# Patient Record
Sex: Male | Born: 1960
Health system: Southern US, Community
[De-identification: ages and names within clinical notes are randomized; demographics above are authoritative.]

## PROBLEM LIST (undated history)

## (undated) DIAGNOSIS — I10 Essential (primary) hypertension: Secondary | ICD-10-CM

## (undated) DIAGNOSIS — R011 Cardiac murmur, unspecified: Secondary | ICD-10-CM

## (undated) DIAGNOSIS — F329 Major depressive disorder, single episode, unspecified: Secondary | ICD-10-CM

## (undated) DIAGNOSIS — I509 Heart failure, unspecified: Secondary | ICD-10-CM

## (undated) DIAGNOSIS — J449 Chronic obstructive pulmonary disease, unspecified: Secondary | ICD-10-CM

## (undated) DIAGNOSIS — G4730 Sleep apnea, unspecified: Secondary | ICD-10-CM

## (undated) DIAGNOSIS — E785 Hyperlipidemia, unspecified: Secondary | ICD-10-CM

## (undated) DIAGNOSIS — J439 Emphysema, unspecified: Secondary | ICD-10-CM

## (undated) DIAGNOSIS — E119 Type 2 diabetes mellitus without complications: Secondary | ICD-10-CM

## (undated) DIAGNOSIS — J45909 Unspecified asthma, uncomplicated: Secondary | ICD-10-CM

## (undated) DIAGNOSIS — F419 Anxiety disorder, unspecified: Secondary | ICD-10-CM

## (undated) DIAGNOSIS — I4891 Unspecified atrial fibrillation: Secondary | ICD-10-CM

## (undated) DIAGNOSIS — M1990 Unspecified osteoarthritis, unspecified site: Secondary | ICD-10-CM

## (undated) DIAGNOSIS — F32A Depression, unspecified: Secondary | ICD-10-CM

## (undated) HISTORY — DX: Major depressive disorder, single episode, unspecified: F32.9

## (undated) HISTORY — DX: Sleep apnea, unspecified: G47.30

## (undated) HISTORY — PX: NO PAST SURGERIES: SHX2092

## (undated) HISTORY — DX: Emphysema, unspecified: J43.9

## (undated) HISTORY — DX: Unspecified osteoarthritis, unspecified site: M19.90

## (undated) HISTORY — DX: Chronic obstructive pulmonary disease, unspecified: J44.9

## (undated) HISTORY — DX: Anxiety disorder, unspecified: F41.9

## (undated) HISTORY — DX: Hyperlipidemia, unspecified: E78.5

## (undated) HISTORY — DX: Unspecified asthma, uncomplicated: J45.909

## (undated) HISTORY — DX: Cardiac murmur, unspecified: R01.1

## (undated) HISTORY — DX: Essential (primary) hypertension: I10

## (undated) HISTORY — DX: Type 2 diabetes mellitus without complications: E11.9

## (undated) HISTORY — DX: Depression, unspecified: F32.A

---

## 1999-01-21 ENCOUNTER — Emergency Department (HOSPITAL_COMMUNITY): Admission: EM | Admit: 1999-01-21 | Discharge: 1999-01-21 | Payer: Self-pay

## 1999-10-23 ENCOUNTER — Inpatient Hospital Stay (HOSPITAL_COMMUNITY): Admission: EM | Admit: 1999-10-23 | Discharge: 1999-10-27 | Payer: Self-pay | Admitting: *Deleted

## 1999-10-28 ENCOUNTER — Other Ambulatory Visit (HOSPITAL_COMMUNITY): Admission: RE | Admit: 1999-10-28 | Discharge: 1999-11-22 | Payer: Self-pay | Admitting: Psychiatry

## 2000-11-05 ENCOUNTER — Emergency Department (HOSPITAL_COMMUNITY): Admission: EM | Admit: 2000-11-05 | Discharge: 2000-11-05 | Payer: Self-pay | Admitting: *Deleted

## 2004-01-10 ENCOUNTER — Emergency Department (HOSPITAL_COMMUNITY): Admission: EM | Admit: 2004-01-10 | Discharge: 2004-01-11 | Payer: Self-pay | Admitting: Emergency Medicine

## 2016-06-05 DIAGNOSIS — E1122 Type 2 diabetes mellitus with diabetic chronic kidney disease: Secondary | ICD-10-CM | POA: Insufficient documentation

## 2016-06-05 DIAGNOSIS — E1140 Type 2 diabetes mellitus with diabetic neuropathy, unspecified: Secondary | ICD-10-CM | POA: Insufficient documentation

## 2016-06-05 DIAGNOSIS — J441 Chronic obstructive pulmonary disease with (acute) exacerbation: Secondary | ICD-10-CM | POA: Insufficient documentation

## 2016-06-05 DIAGNOSIS — E119 Type 2 diabetes mellitus without complications: Secondary | ICD-10-CM | POA: Insufficient documentation

## 2016-06-05 DIAGNOSIS — F418 Other specified anxiety disorders: Secondary | ICD-10-CM | POA: Insufficient documentation

## 2016-06-06 DIAGNOSIS — I129 Hypertensive chronic kidney disease with stage 1 through stage 4 chronic kidney disease, or unspecified chronic kidney disease: Secondary | ICD-10-CM | POA: Insufficient documentation

## 2016-06-06 DIAGNOSIS — D649 Anemia, unspecified: Secondary | ICD-10-CM | POA: Insufficient documentation

## 2016-06-07 DIAGNOSIS — K5901 Slow transit constipation: Secondary | ICD-10-CM | POA: Insufficient documentation

## 2017-11-28 DIAGNOSIS — I480 Paroxysmal atrial fibrillation: Secondary | ICD-10-CM | POA: Diagnosis present

## 2017-11-28 DIAGNOSIS — I5023 Acute on chronic systolic (congestive) heart failure: Secondary | ICD-10-CM | POA: Insufficient documentation

## 2017-12-14 ENCOUNTER — Encounter: Payer: Self-pay | Admitting: Family Medicine

## 2018-07-06 ENCOUNTER — Ambulatory Visit (INDEPENDENT_AMBULATORY_CARE_PROVIDER_SITE_OTHER): Payer: Medicare Other | Admitting: Family Medicine

## 2018-07-06 ENCOUNTER — Encounter: Payer: Self-pay | Admitting: Family Medicine

## 2018-07-06 VITALS — BP 130/70 | HR 57 | Temp 97.6°F | Ht 72.0 in | Wt 164.4 lb

## 2018-07-06 DIAGNOSIS — J449 Chronic obstructive pulmonary disease, unspecified: Secondary | ICD-10-CM | POA: Diagnosis not present

## 2018-07-06 DIAGNOSIS — F418 Other specified anxiety disorders: Secondary | ICD-10-CM | POA: Diagnosis not present

## 2018-07-06 DIAGNOSIS — F329 Major depressive disorder, single episode, unspecified: Secondary | ICD-10-CM | POA: Insufficient documentation

## 2018-07-06 DIAGNOSIS — Z0000 Encounter for general adult medical examination without abnormal findings: Secondary | ICD-10-CM | POA: Insufficient documentation

## 2018-07-06 DIAGNOSIS — I152 Hypertension secondary to endocrine disorders: Secondary | ICD-10-CM | POA: Insufficient documentation

## 2018-07-06 DIAGNOSIS — E119 Type 2 diabetes mellitus without complications: Secondary | ICD-10-CM | POA: Diagnosis not present

## 2018-07-06 DIAGNOSIS — Z0001 Encounter for general adult medical examination with abnormal findings: Secondary | ICD-10-CM

## 2018-07-06 DIAGNOSIS — D539 Nutritional anemia, unspecified: Secondary | ICD-10-CM | POA: Insufficient documentation

## 2018-07-06 DIAGNOSIS — D649 Anemia, unspecified: Secondary | ICD-10-CM | POA: Diagnosis not present

## 2018-07-06 DIAGNOSIS — I10 Essential (primary) hypertension: Secondary | ICD-10-CM | POA: Diagnosis not present

## 2018-07-06 DIAGNOSIS — F1010 Alcohol abuse, uncomplicated: Secondary | ICD-10-CM | POA: Insufficient documentation

## 2018-07-06 DIAGNOSIS — Z7189 Other specified counseling: Secondary | ICD-10-CM | POA: Insufficient documentation

## 2018-07-06 DIAGNOSIS — E1159 Type 2 diabetes mellitus with other circulatory complications: Secondary | ICD-10-CM | POA: Insufficient documentation

## 2018-07-06 LAB — URINALYSIS, ROUTINE W REFLEX MICROSCOPIC
Bilirubin Urine: NEGATIVE
Hgb urine dipstick: NEGATIVE
KETONES UR: NEGATIVE
Leukocytes, UA: NEGATIVE
Nitrite: NEGATIVE
PH: 7 (ref 5.0–8.0)
RBC / HPF: NONE SEEN (ref 0–?)
SPECIFIC GRAVITY, URINE: 1.01 (ref 1.000–1.030)
Total Protein, Urine: NEGATIVE
UROBILINOGEN UA: 0.2 (ref 0.0–1.0)
Urine Glucose: NEGATIVE

## 2018-07-06 LAB — COMPREHENSIVE METABOLIC PANEL
ALT: 16 U/L (ref 0–53)
AST: 16 U/L (ref 0–37)
Albumin: 4.2 g/dL (ref 3.5–5.2)
Alkaline Phosphatase: 67 U/L (ref 39–117)
BUN: 11 mg/dL (ref 6–23)
CALCIUM: 9.5 mg/dL (ref 8.4–10.5)
CO2: 36 meq/L — AB (ref 19–32)
Chloride: 100 mEq/L (ref 96–112)
Creatinine, Ser: 1.26 mg/dL (ref 0.40–1.50)
GFR: 75.87 mL/min (ref >60.00)
GLUCOSE: 91 mg/dL (ref 70–99)
Potassium: 4.6 mEq/L (ref 3.5–5.1)
Sodium: 140 mEq/L (ref 135–145)
Total Bilirubin: 0.3 mg/dL (ref 0.2–1.2)
Total Protein: 7.1 g/dL (ref 6.0–8.3)

## 2018-07-06 LAB — PSA: PSA: 2.52 ng/mL (ref 0.10–4.00)

## 2018-07-06 LAB — CBC
HCT: 41.4 % (ref 39.0–52.0)
HEMOGLOBIN: 14.1 g/dL (ref 13.0–17.0)
MCHC: 34.1 g/dL (ref 30.0–36.0)
MCV: 89.6 fl (ref 78.0–100.0)
PLATELETS: 234 10*3/uL (ref 150.0–400.0)
RBC: 4.63 Mil/uL (ref 4.22–5.81)
RDW: 14.7 % (ref 11.5–15.5)
WBC: 4.8 10*3/uL (ref 4.0–10.5)

## 2018-07-06 LAB — LIPID PANEL
CHOL/HDL RATIO: 3
Cholesterol: 226 mg/dL — ABNORMAL HIGH (ref 0–200)
HDL: 68.4 mg/dL (ref >39.00)
LDL Cholesterol: 119 mg/dL — ABNORMAL HIGH (ref 0–99)
NONHDL: 157.44
TRIGLYCERIDES: 190 mg/dL — AB (ref 0.0–149.0)
VLDL: 38 mg/dL (ref 0.0–40.0)

## 2018-07-06 LAB — MICROALBUMIN / CREATININE URINE RATIO
CREATININE, U: 23.8 mg/dL
Microalb Creat Ratio: 2.9 mg/g (ref 0.0–30.0)

## 2018-07-06 LAB — HEMOGLOBIN A1C: HEMOGLOBIN A1C: 6.1 % (ref 4.6–6.5)

## 2018-07-06 LAB — TSH: TSH: 4.32 u[IU]/mL (ref 0.35–4.50)

## 2018-07-06 MED ORDER — FLUOXETINE HCL 60 MG PO TABS: 1.0000 | ORAL_TABLET | Freq: Every day | ORAL | 2 refills | Status: DC

## 2018-07-06 MED ORDER — BUSPIRONE HCL 15 MG PO TABS: 15.0000 mg | ORAL_TABLET | Freq: Two times a day (BID) | ORAL | 1 refills | Status: DC

## 2018-07-06 NOTE — Progress Notes (Signed)
Subjective:  Patient ID: Kenneth [name], male    DOB: 10-Jun-1961  Age: 57 y.o. MRN: [id number]  CC: Establish Care   HPI Kenneth [name] presents for establishment of care and follow-up of multiple medical problems.  He moved up here 3 months ago.  And brings in the medicine is today that he is currently taking.  In the past he is taking turgor, hydralazine and Pradaxa.  As far as he knows he has no history of stroke, heart attack, or DVT.  He is not certain why he has been given these medicines in the past.  As far as he knows he does not have chronic kidney disease.  He does have a history of COPD and uses an albuterol inhaler as needed.  He continues to smoke 5 cigarettes a week.  He does drink alcohol with up to 6 beers in a given setting on occasion before the football game for example.  He does smoke marijuana on occasion.  He lives with his significant other.  He has 1 child who is his grown daughter.  She lives here in [facility]  He is trying to reestablish relationship with her.  He does have siblings who live in this area.  Is a long-standing history of depression and anxiety.  This is been an issue for him since his mother's death 14 years ago.  He was very close to her.  He has been running low on his Prozac and has been cutting back on the amount that he has been taking.  Up to this point his symptoms were controlled on Prozac 60 mg daily and buspar 10 mg twice daily.  Patient is nonfasting today.  We will go ahead and draw labs today regardless.  He tells me that he has had paperwork signed to release his medical history to [place].  Extensive chart reviewed today does not elucidate his current status given his relatively recent medication list.  History Kenneth [name] has a past medical history of Arthritis, Asthma, Depression, Diabetes mellitus without complication (HCC), Emphysema of lung (HCC), Heart murmur, Hyperlipidemia, and Hypertension.   He has no past surgical history on file.   His family  history includes Diabetes in his sister.He reports that he has been smoking. He has never used smokeless tobacco. He reports that he drinks alcohol. He reports that he has current or past drug history.  Outpatient Medications Prior to Visit  Medication Sig Dispense Refill  . albuterol (PROVENTIL HFA;VENTOLIN HFA) 108 (90 Base) MCG/ACT inhaler Inhale 1 puff into the lungs as needed.    [name] aspirin EC 81 MG tablet Take 1 tablet by mouth daily.    . carvedilol (COREG) 3.125 MG tablet Take 1 tablet by mouth 2 (two) times daily.    . famotidine (PEPCID) 20 MG tablet Take 1 tablet by mouth 2 (two) times daily.    . metFORMIN (GLUCOPHAGE) 500 MG tablet Take 500 mg by mouth 2 (two) times daily with a meal.    . nitroGLYCERIN (NITROSTAT) 0.4 MG SL tablet Place 0.4 mg under the tongue every 5 (five) minutes as needed for chest pain.    . busPIRone (BUSPAR) 10 MG tablet Take 1 tablet by mouth 2 (two) times daily.    . dabigatran (PRADAXA) 150 MG CAPS capsule Take 1 capsule by mouth 2 (two) times daily.    [name] FLUoxetine HCl 60 MG TABS Take 1 tablet by mouth daily.    . hydrALAZINE (APRESOLINE) 50 MG tablet Take 1 tablet by  mouth 3 (three) times daily.    . isosorbide mononitrate (IMDUR) 30 MG 24 hr tablet Take 30 mg by mouth daily.     No facility-administered medications prior to visit.     ROS Review of Systems  Constitutional: Negative for diaphoresis, fatigue, fever and unexpected weight change.  HENT: Negative.   Eyes: Negative for photophobia and visual disturbance.  Respiratory: Negative.   Cardiovascular: Negative.   Gastrointestinal: Negative.   Endocrine: Negative for polyphagia and polyuria.  Genitourinary: Negative.   Musculoskeletal: Negative for gait problem and joint swelling.  Skin: Negative for pallor and rash.  Allergic/Immunologic: Negative for immunocompromised state.  Neurological: Negative for weakness and headaches.  Hematological: Does not bruise/bleed easily.    Psychiatric/Behavioral: Positive for dysphoric mood. The patient is nervous/anxious.     Objective:  BP 130/70   Pulse (!) 57   Temp 97.6 F (36.4 C) (Oral)   Ht 6' (1.829 m)   Wt 164 lb 6 oz (74.6 kg)   SpO2 97%   BMI 22.29 kg/m   Physical Exam  Constitutional: He is oriented to person, place, and time. He appears well-developed and well-nourished. No distress.  HENT:  Head: Normocephalic and atraumatic.  Right Ear: External ear normal.  Left Ear: External ear normal.  Mouth/Throat: Oropharynx is clear and moist. No oropharyngeal exudate.  Eyes: Pupils are equal, round, and reactive to light. Conjunctivae and EOM are normal. Right eye exhibits no discharge. Left eye exhibits no discharge. No scleral icterus.  Neck: Neck supple. No JVD present. No tracheal deviation present. No thyromegaly present.  Cardiovascular: Normal rate, regular rhythm and normal heart sounds.  Pulmonary/Chest: Effort normal. He has decreased breath sounds. He has no wheezes. He has no rhonchi.  Abdominal: Soft.  Lymphadenopathy:    He has no cervical adenopathy.  Neurological: He is alert and oriented to person, place, and time.  Skin: Skin is warm and dry. He is not diaphoretic.  Psychiatric: He has a normal mood and affect. His speech is delayed. He is slowed.      Assessment & Plan:   Kenneth [name] was seen today for establish care.  Diagnoses and all orders for this visit:  Essential hypertension -     CBC -     Comprehensive metabolic panel -     Urinalysis, Routine w reflex microscopic -     TSH -     Microalbumin / creatinine urine ratio  Chronic obstructive pulmonary disease, unspecified COPD type (HCC)  Controlled type 2 diabetes mellitus without complication, without long-term current use of insulin (HCC) -     Microalbumin / creatinine urine ratio -     Hemoglobin A1c  Depression with anxiety -     FLUoxetine HCl 60 MG TABS; Take 1 tablet by mouth daily. -     busPIRone (BUSPAR)  15 MG tablet; Take 1 tablet (15 mg total) by mouth 2 (two) times daily.  Anemia, unspecified type -     CBC -     TSH -     Iron, TIBC and Ferritin Panel  Encounter for health maintenance examination with abnormal findings -     CBC -     Comprehensive metabolic panel -     Lipid panel -     PSA -     Urinalysis, Routine w reflex microscopic  Alcohol abuse   I have discontinued Manish Bero's busPIRone, dabigatran, hydrALAZINE, and isosorbide mononitrate. I am also having him start on busPIRone. Additionally, I  am having him maintain his albuterol, aspirin EC, carvedilol, famotidine, nitroGLYCERIN, FLUoxetine HCl, and metFORMIN.  Meds ordered this encounter  Medications  . FLUoxetine HCl 60 MG TABS    Sig: Take 1 tablet by mouth daily.    Dispense:  30 tablet    Refill:  2  . busPIRone (BUSPAR) 15 MG tablet    Sig: Take 1 tablet (15 mg total) by mouth 2 (two) times daily.    Dispense:  60 tablet    Refill:  1   Will restart his Prozac at his original dose of 60 mg daily.  Have increased his BuSpar to 15 mg twice a day.  We will have him follow  In 2 weeks.: Return in about 2 weeks (around 07/20/2018).  Mliss Sax, MD

## 2018-07-07 LAB — IRON,TIBC AND FERRITIN PANEL
%SAT: 32 % (calc) (ref 20–48)
Ferritin: 87 ng/mL (ref 38–380)
IRON: 117 ug/dL (ref 50–180)
TIBC: 370 ug/dL (ref 250–425)

## 2018-07-13 ENCOUNTER — Other Ambulatory Visit: Payer: Self-pay | Admitting: Family Medicine

## 2018-07-13 MED ORDER — ALBUTEROL SULFATE (2.5 MG/3ML) 0.083% IN NEBU: 2.5000 mg | INHALATION_SOLUTION | Freq: Four times a day (QID) | RESPIRATORY_TRACT | 3 refills | Status: DC | PRN

## 2018-07-13 NOTE — Addendum Note (Signed)
Addended by: Marcell Anger E on: 07/13/2018 03:40 PM   Modules accepted: Orders

## 2018-07-13 NOTE — Telephone Encounter (Signed)
Contacted pt regarding refill request for albuterol nebulizer solution; this medcation is not on his active medication list; the pt says that he is just establishing care with a physician in the area, and he would like for this prescription to be sent to [facility] because he has used this pharmacy before; the pt also says that he "only has a few blasts left of albuterol inhaler"; the pt also says that the medication is $147.00 and he can not afford it; pt has upcoming appointment with Dr Doreene Burke, [facility] Door, 08/20/18; will route to office for final disposition

## 2018-07-13 NOTE — Telephone Encounter (Signed)
Okay to refill Rx per Dr. Doreene Burke. Rx refilled & I left a voicemail for patient letting him know.

## 2018-07-13 NOTE — Telephone Encounter (Signed)
Copied from CRM [id number]. Topic: Quick Communication - Rx Refill/Question >> Jul 13, 2018  2:26 PM Jaquita Rector A wrote: Medication: Albuterol Sulfate 2.5 Mg/0.5 Ml Solution For Nebulization  Patient called to say that he ran out of this solution  Has the patient contacted their pharmacy? Yes.     Preferred Pharmacy (with phone number or street name): [facility] - [place], [place] - [address] [id number] (Phone) [phone] (Fax)    Agent: Please be advised that RX refills may take up to 3 business days. We ask that you follow-up with your pharmacy.

## 2018-07-14 NOTE — Addendum Note (Signed)
Addended by: Marcell Anger E on: 07/14/2018 11:09 AM   Modules accepted: Orders

## 2018-07-20 ENCOUNTER — Encounter: Payer: Self-pay | Admitting: Family Medicine

## 2018-07-20 ENCOUNTER — Ambulatory Visit (INDEPENDENT_AMBULATORY_CARE_PROVIDER_SITE_OTHER): Payer: Medicare Other | Admitting: Family Medicine

## 2018-07-20 VITALS — BP 130/80 | HR 78 | Ht 72.0 in | Wt 164.0 lb

## 2018-07-20 DIAGNOSIS — E119 Type 2 diabetes mellitus without complications: Secondary | ICD-10-CM | POA: Diagnosis not present

## 2018-07-20 DIAGNOSIS — I10 Essential (primary) hypertension: Secondary | ICD-10-CM

## 2018-07-20 DIAGNOSIS — F1010 Alcohol abuse, uncomplicated: Secondary | ICD-10-CM

## 2018-07-20 DIAGNOSIS — F418 Other specified anxiety disorders: Secondary | ICD-10-CM | POA: Diagnosis not present

## 2018-07-20 DIAGNOSIS — J45909 Unspecified asthma, uncomplicated: Secondary | ICD-10-CM | POA: Diagnosis not present

## 2018-07-20 DIAGNOSIS — J449 Chronic obstructive pulmonary disease, unspecified: Secondary | ICD-10-CM

## 2018-07-20 DIAGNOSIS — K5901 Slow transit constipation: Secondary | ICD-10-CM

## 2018-07-20 MED ORDER — CARVEDILOL 3.125 MG PO TABS: 3.1250 mg | ORAL_TABLET | Freq: Two times a day (BID) | ORAL | 3 refills | Status: DC

## 2018-07-20 MED ORDER — FLUOXETINE HCL 60 MG PO TABS: 1.0000 | ORAL_TABLET | Freq: Every day | ORAL | 1 refills | Status: DC

## 2018-07-20 MED ORDER — BUSPIRONE HCL 15 MG PO TABS: 15.0000 mg | ORAL_TABLET | Freq: Two times a day (BID) | ORAL | 1 refills | Status: DC

## 2018-07-20 MED ORDER — LOSARTAN POTASSIUM-HCTZ 100-25 MG PO TABS: 1.0000 | ORAL_TABLET | Freq: Every day | ORAL | 3 refills | Status: DC

## 2018-07-20 MED ORDER — METFORMIN HCL 500 MG PO TABS: 500.0000 mg | ORAL_TABLET | Freq: Two times a day (BID) | ORAL | 3 refills | Status: DC

## 2018-07-20 MED ORDER — FUROSEMIDE 20 MG PO TABS: 20.0000 mg | ORAL_TABLET | Freq: Every day | ORAL | 3 refills | Status: DC

## 2018-07-20 MED ORDER — POLYETHYLENE GLYCOL 3350 17 GM/SCOOP PO POWD: 17.0000 g | Freq: Two times a day (BID) | ORAL | 1 refills | Status: DC | PRN

## 2018-07-20 NOTE — Progress Notes (Signed)
Subjective:  Patient ID: Kenneth [name], male    DOB: 11/10/1960  Age: 57 y.o. MRN: [id number]  CC: Follow-up   HPI Kenneth [name] presents for follow-up of his hypertension, COPD,.Beatties depression.  Blood pressure is under good control with the carvedilol, losartan with HCTZ TZ, Lasix.  Patient uses is listed nebs on a as needed basis for his COPD.  Although he says that he is only smoking about 6 cigarettes I have asked him to stop altogether.  Last hemoglobin A1c was 6.1 taking the metformin twice daily.  He tells me he has been on long-term therapy with Prozac 60 mg daily and BuSpar.  PHQ 9 was filled out today for his.  Still concerned about his alcohol usage with some indication of binge drinking.  Today he tells me about some constipation.  He wonders how often he can use milk of magnesia.  He tells also crampy abdominal pain that can radiate up into his chest.  It is relieved when it when he belches or passes gas.  He is not having exertional chest pain.  Outpatient Medications Prior to Visit  Medication Sig Dispense Refill  . albuterol (PROVENTIL HFA;VENTOLIN HFA) 108 (90 Base) MCG/ACT inhaler Inhale 1 puff into the lungs as needed.    [name] albuterol (PROVENTIL) (2.5 MG/3ML) 0.083% nebulizer solution Take 2.5 mg by nebulization 3 (three) times daily.    [name] aspirin EC 81 MG tablet Take 1 tablet by mouth daily.    . famotidine (PEPCID) 20 MG tablet Take 1 tablet by mouth 2 (two) times daily.    . nitroGLYCERIN (NITROSTAT) 0.4 MG SL tablet Place 0.4 mg under the tongue every 5 (five) minutes as needed for chest pain.    . busPIRone (BUSPAR) 15 MG tablet Take 1 tablet (15 mg total) by mouth 2 (two) times daily. 60 tablet 1  . carvedilol (COREG) 3.125 MG tablet Take 1 tablet by mouth 2 (two) times daily.    [name] FLUoxetine HCl 60 MG TABS Take 1 tablet by mouth daily. 30 tablet 2  . furosemide (LASIX) 20 MG tablet Take 20 mg by mouth daily.    [name] losartan-hydrochlorothiazide (HYZAAR) 100-25 MG  tablet Take 1 tablet by mouth daily.    . metFORMIN (GLUCOPHAGE) 500 MG tablet Take 500 mg by mouth 2 (two) times daily with a meal.     No facility-administered medications prior to visit.     ROS Review of Systems  Constitutional: Negative.   HENT: Negative.   Eyes: Negative.   Respiratory: Negative.   Cardiovascular: Negative.   Gastrointestinal: Positive for constipation. Negative for abdominal pain, anal bleeding and blood in stool.  Endocrine: Negative for polyphagia and polyuria.  Genitourinary: Negative.   Musculoskeletal: Negative for gait problem.  Skin: Negative.   Allergic/Immunologic: Negative for immunocompromised state.  Neurological: Negative for weakness and headaches.  Hematological: Does not bruise/bleed easily.  Psychiatric/Behavioral: Positive for decreased concentration and dysphoric mood. Negative for self-injury and suicidal ideas.   Depression screen [facility] 2/9 07/20/2018 07/06/2018  Decreased Interest 1 3  Down, Depressed, Hopeless 1 2  PHQ - 2 Score 2 5  Altered sleeping 2 3  Tired, decreased energy 1 2  Change in appetite 1 2  Feeling bad or failure about yourself  0 1  Trouble concentrating 2 1  Moving slowly or fidgety/restless 1 1  Suicidal thoughts 0 0  PHQ-9 Score 9 15     Objective:  BP 130/80   Pulse 78   Ht  6' (1.829 m)   Wt 164 lb (74.4 kg)   SpO2 97%   BMI 22.24 kg/m   BP Readings from Last 3 Encounters:  07/20/18 130/80  07/06/18 130/70    Wt Readings from Last 3 Encounters:  07/20/18 164 lb (74.4 kg)  07/06/18 164 lb 6 oz (74.6 kg)    Physical Exam  Lab Results  Component Value Date   WBC 4.8 07/06/2018   HGB 14.1 07/06/2018   HCT 41.4 07/06/2018   PLT 234.0 07/06/2018   GLUCOSE 91 07/06/2018   CHOL 226 (H) 07/06/2018   TRIG 190.0 (H) 07/06/2018   HDL 68.40 07/06/2018   LDLCALC 119 (H) 07/06/2018   ALT 16 07/06/2018   AST 16 07/06/2018   NA 140 07/06/2018   K 4.6 07/06/2018   CL 100 07/06/2018   CREATININE  1.26 07/06/2018   BUN 11 07/06/2018   CO2 36 (H) 07/06/2018   TSH 4.32 07/06/2018   PSA 2.52 07/06/2018   HGBA1C 6.1 07/06/2018   MICROALBUR <0.7 07/06/2018    Dg Chest 2 View  Result Date: 01/12/2004 Clinical Data: Shortness of breath, chest pain. CHEST TWO VIEWS Comparison 11/05/00. The heart size is normal. No heart failure. There is minimal patchy density in both lung bases which may be atelectasis. No effusion. IMPRESSION Minimal bibasilar atelectasis. Provider: Vergia [name]   Assessment & Plan:   [name] was seen today for follow-up.  Diagnoses and all orders for this visit:  Essential hypertension -     carvedilol (COREG) 3.125 MG tablet; Take 1 tablet (3.125 mg total) by mouth 2 (two) times daily. -     furosemide (LASIX) 20 MG tablet; Take 1 tablet (20 mg total) by mouth daily. -     losartan-hydrochlorothiazide (HYZAAR) 100-25 MG tablet; Take 1 tablet by mouth daily.  Chronic obstructive pulmonary disease, unspecified COPD type (HCC)  Controlled type 2 diabetes mellitus without complication, without long-term current use of insulin (HCC) -     metFORMIN (GLUCOPHAGE) 500 MG tablet; Take 1 tablet (500 mg total) by mouth 2 (two) times daily with a meal.  Depression with anxiety -     busPIRone (BUSPAR) 15 MG tablet; Take 1 tablet (15 mg total) by mouth 2 (two) times daily. -     FLUoxetine HCl 60 MG TABS; Take 1 tablet by mouth daily.  Alcohol abuse  Slow transit constipation -     polyethylene glycol powder (GLYCOLAX/MIRALAX) powder; Take 17 g by mouth 2 (two) times daily as needed.   I have changed Kenneth [name]'s carvedilol, furosemide, and metFORMIN. I am also having him start on polyethylene glycol powder. Additionally, I am having him maintain his albuterol, aspirin EC, famotidine, nitroGLYCERIN, albuterol, busPIRone, FLUoxetine HCl, and losartan-hydrochlorothiazide.  Meds ordered this encounter  Medications  . busPIRone (BUSPAR) 15 MG tablet    Sig: Take 1  tablet (15 mg total) by mouth 2 (two) times daily.    Dispense:  180 tablet    Refill:  1  . carvedilol (COREG) 3.125 MG tablet    Sig: Take 1 tablet (3.125 mg total) by mouth 2 (two) times daily.    Dispense:  180 tablet    Refill:  3  . FLUoxetine HCl 60 MG TABS    Sig: Take 1 tablet by mouth daily.    Dispense:  90 tablet    Refill:  1  . furosemide (LASIX) 20 MG tablet    Sig: Take 1 tablet (20 mg total) by mouth daily.  Dispense:  90 tablet    Refill:  3  . losartan-hydrochlorothiazide (HYZAAR) 100-25 MG tablet    Sig: Take 1 tablet by mouth daily.    Dispense:  90 tablet    Refill:  3  . metFORMIN (GLUCOPHAGE) 500 MG tablet    Sig: Take 1 tablet (500 mg total) by mouth 2 (two) times daily with a meal.    Dispense:  180 tablet    Refill:  3  . polyethylene glycol powder (GLYCOLAX/MIRALAX) powder    Sig: Take 17 g by mouth 2 (two) times daily as needed.    Dispense:  3350 g    Refill:  1   Continue current medicines as above.  Patient would like to use [facility].  Reminded him that he would need to come in well before his prescriptions had run out.  Will consider adding a statin at the next clinic visit.  Have asked patient to stop smoking altogether.  Follow-up: Return in about 3 months (around 10/20/2018).  Mliss Sax, MD

## 2018-07-27 ENCOUNTER — Encounter (HOSPITAL_COMMUNITY): Payer: Self-pay | Admitting: Emergency Medicine

## 2018-07-27 ENCOUNTER — Ambulatory Visit: Payer: Self-pay

## 2018-07-27 ENCOUNTER — Emergency Department (HOSPITAL_COMMUNITY): Payer: Medicare Other

## 2018-07-27 ENCOUNTER — Inpatient Hospital Stay (HOSPITAL_COMMUNITY)
Admission: EM | Admit: 2018-07-27 | Discharge: 2018-07-30 | DRG: 291 | Disposition: A | Discharge status: Home / Self Care | Payer: Medicare Other | Attending: Internal Medicine | Admitting: Internal Medicine

## 2018-07-27 DIAGNOSIS — I5032 Chronic diastolic (congestive) heart failure: Secondary | ICD-10-CM

## 2018-07-27 DIAGNOSIS — R079 Chest pain, unspecified: Secondary | ICD-10-CM | POA: Diagnosis not present

## 2018-07-27 DIAGNOSIS — E785 Hyperlipidemia, unspecified: Secondary | ICD-10-CM | POA: Diagnosis present

## 2018-07-27 DIAGNOSIS — J9811 Atelectasis: Secondary | ICD-10-CM | POA: Diagnosis present

## 2018-07-27 DIAGNOSIS — I130 Hypertensive heart and chronic kidney disease with heart failure and stage 1 through stage 4 chronic kidney disease, or unspecified chronic kidney disease: Principal | ICD-10-CM | POA: Diagnosis present

## 2018-07-27 DIAGNOSIS — Z888 Allergy status to other drugs, medicaments and biological substances status: Secondary | ICD-10-CM

## 2018-07-27 DIAGNOSIS — I5042 Chronic combined systolic (congestive) and diastolic (congestive) heart failure: Secondary | ICD-10-CM | POA: Insufficient documentation

## 2018-07-27 DIAGNOSIS — J9600 Acute respiratory failure, unspecified whether with hypoxia or hypercapnia: Secondary | ICD-10-CM | POA: Diagnosis present

## 2018-07-27 DIAGNOSIS — E119 Type 2 diabetes mellitus without complications: Secondary | ICD-10-CM | POA: Diagnosis not present

## 2018-07-27 DIAGNOSIS — I480 Paroxysmal atrial fibrillation: Secondary | ICD-10-CM | POA: Diagnosis present

## 2018-07-27 DIAGNOSIS — Z7982 Long term (current) use of aspirin: Secondary | ICD-10-CM

## 2018-07-27 DIAGNOSIS — Z8249 Family history of ischemic heart disease and other diseases of the circulatory system: Secondary | ICD-10-CM

## 2018-07-27 DIAGNOSIS — Z79899 Other long term (current) drug therapy: Secondary | ICD-10-CM

## 2018-07-27 DIAGNOSIS — E1159 Type 2 diabetes mellitus with other circulatory complications: Secondary | ICD-10-CM | POA: Diagnosis present

## 2018-07-27 DIAGNOSIS — J449 Chronic obstructive pulmonary disease, unspecified: Secondary | ICD-10-CM | POA: Diagnosis not present

## 2018-07-27 DIAGNOSIS — K5900 Constipation, unspecified: Secondary | ICD-10-CM | POA: Diagnosis present

## 2018-07-27 DIAGNOSIS — F418 Other specified anxiety disorders: Secondary | ICD-10-CM

## 2018-07-27 DIAGNOSIS — R0603 Acute respiratory distress: Secondary | ICD-10-CM

## 2018-07-27 DIAGNOSIS — M1990 Unspecified osteoarthritis, unspecified site: Secondary | ICD-10-CM | POA: Diagnosis present

## 2018-07-27 DIAGNOSIS — N183 Chronic kidney disease, stage 3 (moderate): Secondary | ICD-10-CM | POA: Diagnosis present

## 2018-07-27 DIAGNOSIS — F419 Anxiety disorder, unspecified: Secondary | ICD-10-CM | POA: Diagnosis present

## 2018-07-27 DIAGNOSIS — M25579 Pain in unspecified ankle and joints of unspecified foot: Secondary | ICD-10-CM | POA: Diagnosis present

## 2018-07-27 DIAGNOSIS — F17210 Nicotine dependence, cigarettes, uncomplicated: Secondary | ICD-10-CM | POA: Diagnosis present

## 2018-07-27 DIAGNOSIS — R072 Precordial pain: Secondary | ICD-10-CM

## 2018-07-27 DIAGNOSIS — F1010 Alcohol abuse, uncomplicated: Secondary | ICD-10-CM | POA: Diagnosis present

## 2018-07-27 DIAGNOSIS — G8929 Other chronic pain: Secondary | ICD-10-CM | POA: Diagnosis present

## 2018-07-27 DIAGNOSIS — R0602 Shortness of breath: Secondary | ICD-10-CM

## 2018-07-27 DIAGNOSIS — F329 Major depressive disorder, single episode, unspecified: Secondary | ICD-10-CM | POA: Diagnosis present

## 2018-07-27 DIAGNOSIS — I152 Hypertension secondary to endocrine disorders: Secondary | ICD-10-CM | POA: Diagnosis present

## 2018-07-27 DIAGNOSIS — Z91018 Allergy to other foods: Secondary | ICD-10-CM

## 2018-07-27 DIAGNOSIS — I10 Essential (primary) hypertension: Secondary | ICD-10-CM

## 2018-07-27 DIAGNOSIS — Z716 Tobacco abuse counseling: Secondary | ICD-10-CM

## 2018-07-27 DIAGNOSIS — Z833 Family history of diabetes mellitus: Secondary | ICD-10-CM

## 2018-07-27 DIAGNOSIS — I209 Angina pectoris, unspecified: Secondary | ICD-10-CM

## 2018-07-27 DIAGNOSIS — N289 Disorder of kidney and ureter, unspecified: Secondary | ICD-10-CM

## 2018-07-27 DIAGNOSIS — E1122 Type 2 diabetes mellitus with diabetic chronic kidney disease: Secondary | ICD-10-CM | POA: Diagnosis present

## 2018-07-27 DIAGNOSIS — G4700 Insomnia, unspecified: Secondary | ICD-10-CM | POA: Diagnosis present

## 2018-07-27 DIAGNOSIS — R011 Cardiac murmur, unspecified: Secondary | ICD-10-CM | POA: Diagnosis present

## 2018-07-27 DIAGNOSIS — I5033 Acute on chronic diastolic (congestive) heart failure: Secondary | ICD-10-CM | POA: Diagnosis present

## 2018-07-27 DIAGNOSIS — J439 Emphysema, unspecified: Secondary | ICD-10-CM | POA: Diagnosis present

## 2018-07-27 LAB — BASIC METABOLIC PANEL
Anion gap: 7 (ref 5–15)
BUN: 13 mg/dL (ref 6–20)
CALCIUM: 9 mg/dL (ref 8.9–10.3)
CO2: 33 mmol/L — AB (ref 22–32)
Chloride: 101 mmol/L (ref 98–111)
Creatinine, Ser: 1.31 mg/dL — ABNORMAL HIGH (ref 0.61–1.24)
GFR calc non Af Amer: 59 mL/min — ABNORMAL LOW (ref >60)
Glucose, Bld: 100 mg/dL — ABNORMAL HIGH (ref 70–99)
Potassium: 4.2 mmol/L (ref 3.5–5.1)
SODIUM: 141 mmol/L (ref 135–145)

## 2018-07-27 LAB — CBC
HCT: 43.1 % (ref 39.0–52.0)
Hemoglobin: 13.3 g/dL (ref 13.0–17.0)
MCH: 28.9 pg (ref 26.0–34.0)
MCHC: 30.9 g/dL (ref 30.0–36.0)
MCV: 93.7 fL (ref 80.0–100.0)
PLATELETS: 209 10*3/uL (ref 150–400)
RBC: 4.6 MIL/uL (ref 4.22–5.81)
RDW: 14 % (ref 11.5–15.5)
WBC: 4.8 10*3/uL (ref 4.0–10.5)
nRBC: 0 % (ref 0.0–0.2)

## 2018-07-27 LAB — I-STAT TROPONIN, ED: Troponin i, poc: 0.01 ng/mL (ref 0.00–0.08)

## 2018-07-27 MED ORDER — MORPHINE SULFATE (PF) 2 MG/ML IV SOLN
2.0000 mg | INTRAVENOUS | Status: DC | PRN
  Administered 2018-07-28: 4 mg via INTRAVENOUS
  Administered 2018-07-28 – 2018-07-29 (×3): 2 mg via INTRAVENOUS
  Filled 2018-07-27 (×2): qty 1
  Filled 2018-07-27: qty 2
  Filled 2018-07-27: qty 1

## 2018-07-27 MED ORDER — IPRATROPIUM-ALBUTEROL 0.5-2.5 (3) MG/3ML IN SOLN
3.0000 mL | Freq: Once | RESPIRATORY_TRACT | Status: AC
  Administered 2018-07-27: 3 mL via RESPIRATORY_TRACT
  Filled 2018-07-27: qty 3

## 2018-07-27 MED ORDER — INSULIN ASPART 100 UNIT/ML ~~LOC~~ SOLN
0.0000 [IU] | Freq: Three times a day (TID) | SUBCUTANEOUS | Status: DC
  Administered 2018-07-29 – 2018-07-30 (×2): 1 [IU] via SUBCUTANEOUS

## 2018-07-27 MED ORDER — ONDANSETRON HCL 4 MG/2ML IJ SOLN: 4.0000 mg | Freq: Four times a day (QID) | INTRAMUSCULAR | Status: DC | PRN

## 2018-07-27 MED ORDER — ACETAMINOPHEN 325 MG PO TABS
650.0000 mg | ORAL_TABLET | ORAL | Status: DC | PRN
  Administered 2018-07-29 (×2): 650 mg via ORAL
  Filled 2018-07-27 (×2): qty 2

## 2018-07-27 MED ORDER — ALUM & MAG HYDROXIDE-SIMETH 200-200-20 MG/5ML PO SUSP
30.0000 mL | Freq: Four times a day (QID) | ORAL | Status: DC | PRN
  Administered 2018-07-27: 30 mL via ORAL
  Filled 2018-07-27: qty 30

## 2018-07-27 MED ORDER — FUROSEMIDE 20 MG PO TABS: 20.0000 mg | ORAL_TABLET | Freq: Every day | ORAL | Status: DC

## 2018-07-27 MED ORDER — NITROGLYCERIN 0.4 MG SL SUBL: 0.4000 mg | SUBLINGUAL_TABLET | SUBLINGUAL | Status: DC | PRN

## 2018-07-27 MED ORDER — FLUOXETINE HCL 20 MG PO CAPS
60.0000 mg | ORAL_CAPSULE | Freq: Every day | ORAL | Status: DC
  Administered 2018-07-28 – 2018-07-30 (×3): 60 mg via ORAL
  Filled 2018-07-27 (×3): qty 3

## 2018-07-27 MED ORDER — FAMOTIDINE IN NACL 20-0.9 MG/50ML-% IV SOLN
20.0000 mg | Freq: Two times a day (BID) | INTRAVENOUS | Status: DC
  Administered 2018-07-28: 20 mg via INTRAVENOUS
  Filled 2018-07-27 (×2): qty 50

## 2018-07-27 MED ORDER — CARVEDILOL 3.125 MG PO TABS
3.1250 mg | ORAL_TABLET | Freq: Two times a day (BID) | ORAL | Status: DC
  Administered 2018-07-28 – 2018-07-29 (×3): 3.125 mg via ORAL
  Filled 2018-07-27 (×3): qty 1

## 2018-07-27 MED ORDER — ALBUTEROL SULFATE (2.5 MG/3ML) 0.083% IN NEBU
2.5000 mg | INHALATION_SOLUTION | Freq: Four times a day (QID) | RESPIRATORY_TRACT | Status: DC | PRN
  Administered 2018-07-28: 2.5 mg via RESPIRATORY_TRACT
  Filled 2018-07-27 (×2): qty 3

## 2018-07-27 MED ORDER — ASPIRIN EC 81 MG PO TBEC
81.0000 mg | DELAYED_RELEASE_TABLET | Freq: Every day | ORAL | Status: DC
  Administered 2018-07-28 – 2018-07-30 (×3): 81 mg via ORAL
  Filled 2018-07-27 (×3): qty 1

## 2018-07-27 MED ORDER — ENOXAPARIN SODIUM 40 MG/0.4ML ~~LOC~~ SOLN
40.0000 mg | Freq: Every day | SUBCUTANEOUS | Status: DC
  Administered 2018-07-27 – 2018-07-29 (×3): 40 mg via SUBCUTANEOUS
  Filled 2018-07-27 (×4): qty 0.4

## 2018-07-27 MED ORDER — BUSPIRONE HCL 5 MG PO TABS
15.0000 mg | ORAL_TABLET | Freq: Two times a day (BID) | ORAL | Status: DC
  Administered 2018-07-27 – 2018-07-30 (×6): 15 mg via ORAL
  Filled 2018-07-27 (×6): qty 3

## 2018-07-27 MED ORDER — INSULIN ASPART 100 UNIT/ML ~~LOC~~ SOLN: 0.0000 [IU] | Freq: Every day | SUBCUTANEOUS | Status: DC

## 2018-07-27 NOTE — Telephone Encounter (Signed)
Pt c/o 2 days of "heartburn." Pt stated that his pain is across the upper part of his chest and that his left shoulder hurts greater than the right shoulder. Pt stated he woke up at 4:00 am and took a nitroglycerin and took a few aspirin and the pain subsided. Pt stated that pain comes and goes but gets worse with exertion. Pt stated his pain is a 7 out of 10 (moderate). Pt stated that he had a cardiac cath in the past but no stents were placed.  Pt has a h/o CHF, hypertension, high cholesterol,  Is a smoker and has a strong family history of heart disease. Pt advised to go to [facility] ED for evaluation.   Reason for Disposition . Pain also present in shoulder(s) or arm(s) or jaw  (Exception: pain is clearly made worse by movement)  Answer Assessment - Initial Assessment Questions 1. LOCATION: "Where does it hurt?"       Across the upper part of chest 2. RADIATION: "Does the pain go anywhere else?" (e.g., into neck, jaw, arms, back)     Left shoulder greater than right shoulder 3. ONSET: "When did the chest pain begin?" (Minutes, hours or days)      2 days ago 4. PATTERN "Does the pain come and go, or has it been constant since it started?"  "Does it get worse with exertion?"      Comes and goes-gets worse with exertion 5. DURATION: "How long does it last" (e.g., seconds, minutes, hours)     10-15 minutes at a time 6. SEVERITY: "How bad is the pain?"  (e.g., Scale 1-10; mild, moderate, or severe)    - MILD (1-3): doesn't interfere with normal activities     - MODERATE (4-7): interferes with normal activities or awakens from sleep    - SEVERE (8-10): excruciating pain, unable to do any normal activities       Moderate 7 out of 10 7. CARDIAC RISK FACTORS: "Do you have any history of heart problems or risk factors for heart disease?" (e.g., prior heart attack, angina; high blood pressure, diabetes, being overweight, high cholesterol, smoking, or strong family history of heart disease)     Slow  heart rate, high blood pressure, high cholesterol, smoker, strong family history of heart disease 8. PULMONARY RISK FACTORS: "Do you have any history of lung disease?"  (e.g., blood clots in lung, asthma, emphysema, birth control pills)     COPD 9. CAUSE: "What do you think is causing the chest pain?"     heartburn 10. OTHER SYMPTOMS: "Do you have any other symptoms?" (e.g., dizziness, nausea, vomiting, sweating, fever, difficulty breathing, cough)       no 11. PREGNANCY: "Is there any chance you are pregnant?" "When was your last menstrual period?"       n/a  Protocols used: CHEST PAIN-A-AH

## 2018-07-27 NOTE — ED Provider Notes (Signed)
Patient placed in Quick Look pathway, seen and evaluated   Chief Complaint: shortness of breath  HPI: Kenneth [name] is a 57 y.o. male who presents to the ED with shortness of breath and chest pain. Patient reports hx of COPD, CHF, DM. Patient used neb treatment prior to arrival to the ED.   ROS: Resp: shortness of breath, cough  CV: chest pain  Physical Exam:    Gen: No distress  Neuro: Awake and Alert  Skin: Warm and dry  Lungs: occasional rales RLL, decreased breath sounds.  Initiation of care has begun. The patient has been counseled on the process, plan, and necessity for staying for the completion/evaluation, and the remainder of the medical screening examination    Kenneth Napoleon, NP 07/27/18 1548    Jacalyn Lefevre, MD 07/27/18 1652

## 2018-07-27 NOTE — ED Notes (Signed)
Pt reports he took his home nitro while waiting, now states his pain has subsided.

## 2018-07-27 NOTE — ED Triage Notes (Signed)
Pt presents with CP with radiation to L shoulder and neck since yesterday; pt also states he has COPD and took inhaler with some improvement

## 2018-07-27 NOTE — ED Notes (Signed)
ED TO INPATIENT HANDOFF REPORT  Name/Age/Gender Kenneth [name] 57 y.o. male  Code Status    Code Status Orders  (From admission, onward)         Start     Ordered   07/27/18 2201  Full code  Continuous     07/27/18 2202        Code Status History    This patient has a current code status but no historical code status.      Home/SNF/Other Home  Chief Complaint chest pain   Level of Care/Admitting Diagnosis ED Disposition    ED Disposition Condition [facility] Area: [place] [100100]  Level of Care: Telemetry [5]  Diagnosis: Chest pain [409811]  Admitting Physician: Vianne Bulls [[id number]]  Attending Physician: Vianne Bulls [[id number]]  PT Class (Do Not Modify): Observation [104]  PT Acc Code (Do Not Modify): Observation [10022]       Medical History Past Medical History:  Diagnosis Date  . Arthritis   . Asthma   . Depression   . Diabetes mellitus without complication ([place])   . Emphysema of lung ([place])   . Heart murmur   . Hyperlipidemia   . Hypertension     Allergies Allergies  Allergen Reactions  . Lisinopril Swelling  . Tomato Rash    IV Location/Drains/Wounds Patient Lines/Drains/Airways Status   Active Line/Drains/Airways    None          Labs/Imaging Results for orders placed or performed during the hospital encounter of 07/27/18 (from the past 48 hour(s))  Basic metabolic panel     Status: Abnormal   Collection Time: 07/27/18  3:47 PM  Result Value Ref Range   Sodium 141 135 - 145 mmol/L   Potassium 4.2 3.5 - 5.1 mmol/L   Chloride 101 98 - 111 mmol/L   CO2 33 (H) 22 - 32 mmol/L   Glucose, Bld 100 (H) 70 - 99 mg/dL   BUN 13 6 - 20 mg/dL   Creatinine, Ser 1.31 (H) 0.61 - 1.24 mg/dL   Calcium 9.0 8.9 - 10.3 mg/dL   GFR calc non Af Amer 59 (L) >60 mL/min   GFR calc Af Amer >60 >60 mL/min    Comment: (NOTE) The eGFR has been calculated using the CKD EPI equation. This calculation has not  been validated in all clinical situations. eGFR's persistently <60 mL/min signify possible Chronic Kidney Disease.    Anion gap 7 5 - 15    Comment: Performed at [facility] [address] [place] [address]  CBC     Status: None   Collection Time: 07/27/18  3:47 PM  Result Value Ref Range   WBC 4.8 4.0 - 10.5 K/uL   RBC 4.60 4.22 - 5.81 MIL/uL   Hemoglobin 13.3 13.0 - 17.0 g/dL   HCT 43.1 39.0 - 52.0 %   MCV 93.7 80.0 - 100.0 fL   MCH 28.9 26.0 - 34.0 pg   MCHC 30.9 30.0 - 36.0 g/dL   RDW 14.0 11.5 - 15.5 %   Platelets 209 150 - 400 K/uL   nRBC 0.0 0.0 - 0.2 %    Comment: Performed at [facility], [place] [address], [place], [place] [address]  I-stat troponin, ED     Status: None   Collection Time: 07/27/18  4:10 PM  Result Value Ref Range   Troponin i, poc 0.01 0.00 - 0.08 ng/mL   Comment 3  Comment: Due to the release kinetics of cTnI, a negative result within the first hours of the onset of symptoms does not rule out myocardial infarction with certainty. If myocardial infarction is still suspected, repeat the test at appropriate intervals.    Dg Chest 2 View  Result Date: 07/27/2018 CLINICAL DATA:  Initial evaluation for acute chest pain, shortness of breath. EXAM: CHEST - 2 VIEW COMPARISON:  Prior radiograph from 04/13/2016. FINDINGS: The cardiac and mediastinal silhouettes are stable in size and contour, and remain within normal limits. The lungs are normally inflated. Mild right perihilar atelectasis. No airspace consolidation, pleural effusion, or pulmonary edema is identified. There is no pneumothorax. No acute osseous abnormality identified. IMPRESSION: 1. Mild right perihilar atelectasis. 2. No other active cardiopulmonary disease. Electronically Signed   By: Jeannine Boga M.D.   On: 07/27/2018 16:31    Pending Labs Unresulted Labs (From admission, onward)    Start     Ordered   08/03/18 0500  Creatinine, serum  (enoxaparin  (LOVENOX)    CrCl >/= 30 ml/min)  Weekly,   R    Comments:  while on enoxaparin therapy    07/27/18 2202   07/28/18 0500  HIV antibody (Routine Testing)  Tomorrow morning,   R     07/27/18 2202   07/28/18 7253  Basic metabolic panel  Tomorrow morning,   R     07/27/18 2202   07/27/18 2202  Troponin I  Now then every 6 hours,   STAT     07/27/18 2202          Vitals/Pain Today's Vitals   07/27/18 1538 07/27/18 1843 07/27/18 2011 07/27/18 2145  BP:  (!) 177/84 (!) 165/113 (!) 137/95  Pulse:  63 84 73  Resp:  _0 Temp:  97.6 F (36.4 C)    TempSrc:  Oral    SpO2:  100% 98% 98%  Weight: 77.1 kg     Height: 6' (1.829 m)     PainSc: 5        Isolation Precautions No active isolations  Medications Medications  aspirin EC tablet 81 mg (has no administration in time range)  carvedilol (COREG) tablet 3.125 mg (has no administration in time range)  furosemide (LASIX) tablet 20 mg (has no administration in time range)  busPIRone (BUSPAR) tablet 15 mg (has no administration in time range)  FLUoxetine (PROZAC) capsule 60 mg (has no administration in time range)  albuterol (PROVENTIL) (2.5 MG/3ML) 0.083% nebulizer solution 2.5 mg (has no administration in time range)  nitroGLYCERIN (NITROSTAT) SL tablet 0.4 mg (has no administration in time range)  acetaminophen (TYLENOL) tablet 650 mg (has no administration in time range)  ondansetron (ZOFRAN) injection 4 mg (has no administration in time range)  enoxaparin (LOVENOX) injection 40 mg (has no administration in time range)  morphine 2 MG/ML injection 2-4 mg (has no administration in time range)  famotidine (PEPCID) IVPB 20 mg premix (has no administration in time range)  alum & mag hydroxide-simeth (MAALOX/MYLANTA) 200-200-20 MG/5ML suspension 30 mL (has no administration in time range)  insulin aspart (novoLOG) injection 0-9 Units (has no administration in time range)  insulin aspart (novoLOG) injection 0-5 Units (has no  administration in time range)  ipratropium-albuterol (DUONEB) 0.5-2.5 (3) MG/3ML nebulizer solution 3 mL (3 mLs Nebulization Given 07/27/18 1542)    Mobility walks

## 2018-07-27 NOTE — H&P (Signed)
History and Physical    Kenneth [name] AFB:[id number] DOB: [birth year] DOA: 07/27/2018  PCP: Mliss Sax, MD   Patient coming from: Home   Chief Complaint: Chest pain   HPI: Kenneth [name] is a 57 y.o. male with medical history significant for COPD, type 2 diabetes mellitus, depression with anxiety, paroxysmal atrial fibrillation recently taken off of Pradaxa, and diastolic CHF, now presenting to the emergency department for evaluation of chest pain.  Patient reports decreased exercise tolerance over the past month or so and has developed intermittent chest pain over the past couple days.  Chest pain seems to be worse after eating for the past couple days, eases off over the course of 45 minutes to a couple hours, and was relieved by nitroglycerin earlier today.  He reports some chronic ankle pain related to an injury, but denies any recent lower extremity swelling or tenderness.  Reports mild dyspnea associated with his chest pain, reports a mild chronic cough, but denies any significant shortness of breath at this time.  He took a full dose aspirin prior to arrival.  He had been on Pradaxa for paroxysmal atrial fibrillation, but was taken off of this, and also taken off of Imdur last month by his PCP.  He reports undergoing stress test and catheterization without intervention.  He was admitted to an outside hospital in August for acute CHF and found to have EF 47% at that time.  ED Course: Upon arrival to the ED, patient is found to be afebrile, saturating well on room air, mildly hypertensive, and with vitals otherwise stable.  EKG features a sinus rhythm with LVH and repolarization abnormality.  Chest x-ray is notable for mild right perihilar atelectasis, but no acute cardiopulmonary disease.  Chemistry panel features a creatinine 1.31, similar to priors.  CBC is unremarkable.  Initial troponin is 0.01.  Patient was given a breathing treatment in the ED, remains hemodynamically stable, chest  pain has resolved after nitroglycerin, and he will be observed for ongoing evaluation and management.  Review of Systems:  All other systems reviewed and apart from HPI, are negative.  Past Medical History:  Diagnosis Date  . Arthritis   . Asthma   . Depression   . Diabetes mellitus without complication (HCC)   . Emphysema of lung (HCC)   . Heart murmur   . Hyperlipidemia   . Hypertension     History reviewed. No pertinent surgical history.   reports that he has been smoking cigarettes. He has been smoking about 0.50 packs per day. He has never used smokeless tobacco. He reports that he drinks alcohol. He reports that he has current or past drug history. Drug: Marijuana.  Allergies  Allergen Reactions  . Lisinopril Swelling  . Tomato Rash    Family History  Problem Relation Age of Onset  . Diabetes Sister   . Coronary artery disease Brother      Prior to Admission medications   Medication Sig Start Date End Date Taking? Authorizing Provider  albuterol (PROVENTIL HFA;VENTOLIN HFA) 108 (90 Base) MCG/ACT inhaler Inhale 1 puff into the lungs as needed for wheezing.  06/19/16  Yes [provider]  albuterol (PROVENTIL) (2.5 MG/3ML) 0.083% nebulizer solution Take 2.5 mg by nebulization 3 (three) times daily.   Yes [provider]  aspirin EC 81 MG tablet Take 1 tablet by mouth daily. 06/19/16  Yes [provider]  busPIRone (BUSPAR) 15 MG tablet Take 1 tablet (15 mg total) by mouth 2 (two) times daily.  07/20/18  Yes Mliss Sax, MD  carvedilol (COREG) 3.125 MG tablet Take 1 tablet (3.125 mg total) by mouth 2 (two) times daily. 07/20/18  Yes Mliss Sax, MD  FLUoxetine HCl 60 MG TABS Take 1 tablet by mouth daily. 07/20/18  Yes Mliss Sax, MD  furosemide (LASIX) 20 MG tablet Take 1 tablet (20 mg total) by mouth daily. 07/20/18  Yes Mliss Sax, MD  metFORMIN (GLUCOPHAGE) 500 MG tablet Take 1 tablet (500 mg total)  by mouth 2 (two) times daily with a meal. 07/20/18  Yes Mliss Sax, MD  nitroGLYCERIN (NITROSTAT) 0.4 MG SL tablet Place 0.4 mg under the tongue every 5 (five) minutes as needed for chest pain.   Yes [provider]  losartan-hydrochlorothiazide (HYZAAR) 100-25 MG tablet Take 1 tablet by mouth daily. Patient not taking: Reported on 07/27/2018 07/20/18   Mliss Sax, MD  polyethylene glycol powder [facility]) powder Take 17 g by mouth 2 (two) times daily as needed. Patient not taking: Reported on 07/27/2018 07/20/18   Mliss Sax, MD    Physical Exam: Vitals:   07/27/18 1538 07/27/18 1843 07/27/18 2011 07/27/18 2145  BP:  (!) 177/84 (!) 165/113 (!) 137/95  Pulse:  63 84 73  Resp:  16 16 14   Temp:  97.6 F (36.4 C)    TempSrc:  Oral    SpO2:  100% 98% 98%  Weight: 77.1 kg     Height: 6' (1.829 m)       Constitutional: NAD, calm  Eyes: PERTLA, lids and conjunctivae normal ENMT: Mucous membranes are moist. Posterior pharynx clear of any exudate or lesions.   Neck: normal, supple, no masses, no thyromegaly Respiratory: clear to auscultation bilaterally, no wheezing, no crackles. Normal respiratory effort.   Cardiovascular: S1 & S2 heard, regular rate and rhythm. No extremity edema.   Abdomen: No distension, no tenderness, soft. Bowel sounds normal.  Musculoskeletal: no clubbing / cyanosis. No joint deformity upper and lower extremities.   Skin: no significant rashes, lesions, ulcers. Warm, dry, well-perfused. Neurologic: CN 2-12 grossly intact. Sensation intact. Strength 5/5 in all 4 limbs.  Psychiatric: Alert and oriented x 3. Pleasant, cooperative.    Labs on Admission: I have personally reviewed following labs and imaging studies  CBC: Recent Labs  Lab 07/27/18 1547  WBC 4.8  HGB 13.3  HCT 43.1  MCV 93.7  PLT 209   Basic Metabolic Panel: Recent Labs  Lab 07/27/18 1547  NA 141  K 4.2  CL 101  CO2 33*  GLUCOSE 100*    BUN 13  CREATININE 1.31*  CALCIUM 9.0   GFR: Estimated Creatinine Clearance: 67.8 mL/min (A) (by C-G formula based on SCr of 1.31 mg/dL (H)). Liver Function Tests: No results for input(s): AST, ALT, ALKPHOS, BILITOT, PROT, ALBUMIN in the last 168 hours. No results for input(s): LIPASE, AMYLASE in the last 168 hours. No results for input(s): AMMONIA in the last 168 hours. Coagulation Profile: No results for input(s): INR, PROTIME in the last 168 hours. Cardiac Enzymes: No results for input(s): CKTOTAL, CKMB, CKMBINDEX, TROPONINI in the last 168 hours. BNP (last 3 results) No results for input(s): PROBNP in the last 8760 hours. HbA1C: No results for input(s): HGBA1C in the last 72 hours. CBG: No results for input(s): GLUCAP in the last 168 hours. Lipid Profile: No results for input(s): CHOL, HDL, LDLCALC, TRIG, CHOLHDL, LDLDIRECT in the last 72 hours. Thyroid Function Tests: No results for input(s): TSH, T4TOTAL, FREET4,  T3FREE, THYROIDAB in the last 72 hours. Anemia Panel: No results for input(s): VITAMINB12, FOLATE, FERRITIN, TIBC, IRON, RETICCTPCT in the last 72 hours. Urine analysis:    Component Value Date/Time   COLORURINE YELLOW 07/06/2018 1355   APPEARANCEUR CLEAR 07/06/2018 1355   LABSPEC 1.010 07/06/2018 1355   PHURINE 7.0 07/06/2018 1355   GLUCOSEU NEGATIVE 07/06/2018 1355   HGBUR NEGATIVE 07/06/2018 1355   BILIRUBINUR NEGATIVE 07/06/2018 1355   KETONESUR NEGATIVE 07/06/2018 1355   UROBILINOGEN 0.2 07/06/2018 1355   NITRITE NEGATIVE 07/06/2018 1355   LEUKOCYTESUR NEGATIVE 07/06/2018 1355   Sepsis Labs: @LABRCNTIP (procalcitonin:4,lacticidven:4) )No results found for this or any previous visit (from the past 240 hour(s)).   Radiological Exams on Admission: Dg Chest 2 View  Result Date: 07/27/2018 CLINICAL DATA:  Initial evaluation for acute chest pain, shortness of breath. EXAM: CHEST - 2 VIEW COMPARISON:  Prior radiograph from 04/13/2016. FINDINGS: The  cardiac and mediastinal silhouettes are stable in size and contour, and remain within normal limits. The lungs are normally inflated. Mild right perihilar atelectasis. No airspace consolidation, pleural effusion, or pulmonary edema is identified. There is no pneumothorax. No acute osseous abnormality identified. IMPRESSION: 1. Mild right perihilar atelectasis. 2. No other active cardiopulmonary disease. Electronically Signed   By: Rise Mu M.D.   On: 07/27/2018 16:31    EKG: Independently reviewed. Sinus rhythm, LVH, repolarization abnormality.   Assessment/Plan  1. Chest pain   - Presents with intermittent chest pain; occurs after eating, suggestive of GI etiology, but concerning for cardiac etio given his risk factors and resolution of pain with NTG  - EKG features a sinus rhythm with LVH and non-specific repolarization abnormality  - CXR unremarkable  - Initial troponin is 0.01  - He took full-dose ASA prior to arrival  - Continue cardiac monitoring, obtain serial troponin measurements, repeat EKG, trial Pepcid and GI cocktail, continue ASA and beta-blocker   2. Chronic diastolic CHF  - Appears compensated  - Echo from August with EF 47%  - He has stopped his ARB  - Continue Lasix, Coreg, and daily wts   3. Paroxysmal atrial fibrillation  - In sinus rhythm on admission  - CHADS-VASc at least 3 (CHF, DM, HTN)  - Previously on Pradaxa, but discontinued by PCP last month  - Continue ASA and beta-blocker   4. Type II DM  - A1c was 6.1% last month  - Managed with metformin at home, held on admission  - Check CBG's and use a low-intensity SSI with Novolog as needed while in hospital    5. COPD  - Reports mild dyspnea with the chest pain episodes, but no wheezing or SOB at time of admission   - Continue as-needed albuterol    6. Mild renal insufficiency  - SCr is 1.31 on admission, similar to priors  - Renally-dose medications    7. Anxiety  - Continue Buspar and  Prozac    DVT prophylaxis: Lovenox Code Status: Full  Family Communication: Fiance updated at bedside   Consults called: None Admission status: Observation     Briscoe Deutscher, MD [facility] Pager [phone]  If 7PM-7AM, please contact night-coverage [email or web address] Password [facility]  07/27/2018, 10:36 PM

## 2018-07-27 NOTE — ED Notes (Signed)
Pt stated he started having chest pain in the waiting room after he ate a snack and drank a soda

## 2018-07-28 ENCOUNTER — Other Ambulatory Visit: Payer: Self-pay

## 2018-07-28 ENCOUNTER — Encounter (HOSPITAL_COMMUNITY): Payer: Self-pay | Admitting: *Deleted

## 2018-07-28 DIAGNOSIS — E119 Type 2 diabetes mellitus without complications: Secondary | ICD-10-CM | POA: Diagnosis not present

## 2018-07-28 DIAGNOSIS — I5033 Acute on chronic diastolic (congestive) heart failure: Secondary | ICD-10-CM | POA: Diagnosis not present

## 2018-07-28 DIAGNOSIS — F1010 Alcohol abuse, uncomplicated: Secondary | ICD-10-CM | POA: Diagnosis not present

## 2018-07-28 DIAGNOSIS — R079 Chest pain, unspecified: Secondary | ICD-10-CM | POA: Diagnosis not present

## 2018-07-28 LAB — BASIC METABOLIC PANEL
Anion gap: 6 (ref 5–15)
BUN: 11 mg/dL (ref 6–20)
CHLORIDE: 105 mmol/L (ref 98–111)
CO2: 30 mmol/L (ref 22–32)
Calcium: 8.7 mg/dL — ABNORMAL LOW (ref 8.9–10.3)
Creatinine, Ser: 1.22 mg/dL (ref 0.61–1.24)
GFR calc Af Amer: >60 mL/min (ref >60)
GLUCOSE: 90 mg/dL (ref 70–99)
POTASSIUM: 4.1 mmol/L (ref 3.5–5.1)
SODIUM: 141 mmol/L (ref 135–145)

## 2018-07-28 LAB — GLUCOSE, CAPILLARY
GLUCOSE-CAPILLARY: 103 mg/dL — AB (ref 70–99)
GLUCOSE-CAPILLARY: 85 mg/dL (ref 70–99)
GLUCOSE-CAPILLARY: 85 mg/dL (ref 70–99)
Glucose-Capillary: 75 mg/dL (ref 70–99)
Glucose-Capillary: 100 mg/dL — ABNORMAL HIGH (ref 70–99)

## 2018-07-28 LAB — HIV ANTIBODY (ROUTINE TESTING W REFLEX): HIV SCREEN 4TH GENERATION: NONREACTIVE

## 2018-07-28 LAB — RAPID URINE DRUG SCREEN, HOSP PERFORMED
AMPHETAMINES: NOT DETECTED
BARBITURATES: NOT DETECTED
Benzodiazepines: NOT DETECTED
Cocaine: NOT DETECTED
Opiates: POSITIVE — AB
TETRAHYDROCANNABINOL: POSITIVE — AB

## 2018-07-28 LAB — TROPONIN I
Troponin I: <0.03 ng/mL (ref <0.03)
Troponin I: <0.03 ng/mL (ref <0.03)

## 2018-07-28 LAB — BRAIN NATRIURETIC PEPTIDE: B Natriuretic Peptide: 1379.1 pg/mL — ABNORMAL HIGH (ref 0.0–100.0)

## 2018-07-28 MED ORDER — SENNOSIDES-DOCUSATE SODIUM 8.6-50 MG PO TABS
1.0000 | ORAL_TABLET | Freq: Two times a day (BID) | ORAL | Status: DC
  Administered 2018-07-28 – 2018-07-30 (×5): 1 via ORAL
  Filled 2018-07-28 (×5): qty 1

## 2018-07-28 MED ORDER — IPRATROPIUM-ALBUTEROL 0.5-2.5 (3) MG/3ML IN SOLN
3.0000 mL | Freq: Three times a day (TID) | RESPIRATORY_TRACT | Status: DC
  Administered 2018-07-28 – 2018-07-30 (×7): 3 mL via RESPIRATORY_TRACT
  Filled 2018-07-28 (×7): qty 3

## 2018-07-28 MED ORDER — ZOLPIDEM TARTRATE 5 MG PO TABS
5.0000 mg | ORAL_TABLET | Freq: Every evening | ORAL | Status: DC | PRN
  Administered 2018-07-28 – 2018-07-29 (×2): 5 mg via ORAL
  Filled 2018-07-28 (×2): qty 1

## 2018-07-28 MED ORDER — PANTOPRAZOLE SODIUM 40 MG PO TBEC
40.0000 mg | DELAYED_RELEASE_TABLET | Freq: Every day | ORAL | Status: DC
  Administered 2018-07-28 – 2018-07-30 (×3): 40 mg via ORAL
  Filled 2018-07-28 (×3): qty 1

## 2018-07-28 MED ORDER — FUROSEMIDE 10 MG/ML IJ SOLN
40.0000 mg | Freq: Every day | INTRAMUSCULAR | Status: DC
  Administered 2018-07-28: 40 mg via INTRAVENOUS
  Filled 2018-07-28 (×3): qty 4

## 2018-07-28 MED ORDER — ALBUTEROL SULFATE (2.5 MG/3ML) 0.083% IN NEBU
2.5000 mg | INHALATION_SOLUTION | RESPIRATORY_TRACT | Status: DC | PRN
  Administered 2018-07-28: 2.5 mg via RESPIRATORY_TRACT
  Filled 2018-07-28: qty 3

## 2018-07-28 NOTE — Progress Notes (Signed)
Progress Note    Kenneth [name]  KMQ:[id number] DOB: 07-09-1961  DOA: 07/27/2018 PCP: Mliss Sax, MD    Brief Narrative:     Medical records reviewed and are as summarized below:  Kenneth [name] is an 57 y.o. male with medical history significant for COPD, type 2 diabetes mellitus, depression with anxiety, paroxysmal atrial fibrillation recently taken off of Pradaxa, and diastolic CHF, now presenting to the emergency department for evaluation of chest pain.  Patient reports decreased exercise tolerance over the past month or so and has developed intermittent chest pain over the past couple days.  Chest pain seems to be worse after eating for the past couple days, eases off over the course of 45 minutes to a couple hours, and was relieved by nitroglycerin earlier today.  He reports some chronic ankle pain related to an injury, but denies any recent lower extremity swelling or tenderness.  Reports mild dyspnea associated with his chest pain, reports a mild chronic cough, but denies any significant shortness of breath at this time.  He took a full dose aspirin prior to arrival.  He had been on Pradaxa for paroxysmal atrial fibrillation, but was taken off of this, and also taken off of Imdur last month by his PCP.  He reports undergoing stress test and catheterization without intervention.  He was admitted to an outside hospital in August for acute CHF and found to have EF 47% at that time.  Assessment/Plan:   Principal Problem:   Chest pain Active Problems:   Essential hypertension   Chronic obstructive pulmonary disease (HCC)   Controlled type 2 diabetes mellitus without complication, without long-term current use of insulin (HCC)   Depression with anxiety   Alcohol abuse   PAF (paroxysmal atrial fibrillation) (HCC)   Mild renal insufficiency   Acute on chronic diastolic CHF (congestive heart failure) (HCC)  Acute on chronic diastolic CHF -weight in august: 147 at OSH-- here  is 170 -he stopped is ARB on his own -IV lasix with close monitoring of his symptoms -recent echo at OSH-- in care everywhere -BNP > 1000 -check UDS   Chest pain   -cycle CE ? From volume overload vs GI from alcohol use  Paroxysmal atrial fibrillation based on documentation of March 30, 2017 by cardiology in care everywhere - CHADS-VASc at least 3 (CHF, DM, HTN)  - Previously on Pradaxa, but discontinued by PCP last month -- suspect he was unaware of a fib history - Continue ASA and beta-blocker -- defer to PCP to restart Pradaxa  Type II DM  - A1c was 6.1% last month  - Managed with metformin at home, held on admission  - SSI  COPD  - Continue as-needed albuterol   -tobacco cessation  Constipation -bowel regimen  Mild renal insufficiency  - monitor while getting IV lasix  Anxiety  - Continue home meds  Tobacco abuse -encouraged cessation  Alcohol use -monitor for withdrawal  Family Communication/Anticipated D/C date and plan/Code Status   DVT prophylaxis: Lovenox ordered. Code Status: Full Code.  Family Communication: none at bedside Disposition Plan: suspect d/c after IV diuresis--- 24-48 hours   Medical Consultants:    None.     Subjective:   Short of breath just walking to the bathroom  Objective:    Vitals:   07/28/18 0118 07/28/18 0517 07/28/18 0606 07/28/18 0814  BP:  (!) 168/86    Pulse:  74    Resp:  18    Temp:  [name])  97.4 F (36.3 C)    TempSrc:  Oral    SpO2: 98% 100% 98% 98%  Weight:  77 kg    Height:        Intake/Output Summary (Last 24 hours) at 07/28/2018 1134 Last data filed at 07/28/2018 0900 Gross per 24 hour  Intake 290 ml  Output -  Net 290 ml   Filed Weights   07/27/18 1538 07/27/18 2346 07/28/18 0517  Weight: 77.1 kg 77.4 kg 77 kg    Exam: Sitting on side of bed-- wearing O2-- 2L Diminished breath sounds rrr Non-pitting LE edema A+Ox3 Moves all 4 ext  Data Reviewed:   I have personally reviewed  following labs and imaging studies:  Labs: Labs show the following:   Basic Metabolic Panel: Recent Labs  Lab 07/27/18 1547 07/28/18 0401  NA 141 141  K 4.2 4.1  CL 101 105  CO2 33* 30  GLUCOSE 100* 90  BUN 13 11  CREATININE 1.31* 1.22  CALCIUM 9.0 8.7*   GFR Estimated Creatinine Clearance: 72.8 mL/min (by C-G formula based on SCr of 1.22 mg/dL). Liver Function Tests: No results for input(s): AST, ALT, ALKPHOS, BILITOT, PROT, ALBUMIN in the last 168 hours. No results for input(s): LIPASE, AMYLASE in the last 168 hours. No results for input(s): AMMONIA in the last 168 hours. Coagulation profile No results for input(s): INR, PROTIME in the last 168 hours.  CBC: Recent Labs  Lab 07/27/18 1547  WBC 4.8  HGB 13.3  HCT 43.1  MCV 93.7  PLT 209   Cardiac Enzymes: Recent Labs  Lab 07/27/18 2337 07/28/18 0401 07/28/18 0913  TROPONINI <0.03 <0.03 <0.03   BNP (last 3 results) No results for input(s): PROBNP in the last 8760 hours. CBG: Recent Labs  Lab 07/27/18 2357 07/28/18 0801  GLUCAP 103* 85   D-Dimer: No results for input(s): DDIMER in the last 72 hours. Hgb A1c: No results for input(s): HGBA1C in the last 72 hours. Lipid Profile: No results for input(s): CHOL, HDL, LDLCALC, TRIG, CHOLHDL, LDLDIRECT in the last 72 hours. Thyroid function studies: No results for input(s): TSH, T4TOTAL, T3FREE, THYROIDAB in the last 72 hours.  Invalid input(s): FREET3 Anemia work up: No results for input(s): VITAMINB12, FOLATE, FERRITIN, TIBC, IRON, RETICCTPCT in the last 72 hours. Sepsis Labs: Recent Labs  Lab 07/27/18 1547  WBC 4.8    Microbiology No results found for this or any previous visit (from the past 240 hour(s)).  Procedures and diagnostic studies:  Dg Chest 2 View  Result Date: 07/27/2018 CLINICAL DATA:  Initial evaluation for acute chest pain, shortness of breath. EXAM: CHEST - 2 VIEW COMPARISON:  Prior radiograph from 04/13/2016. FINDINGS: The  cardiac and mediastinal silhouettes are stable in size and contour, and remain within normal limits. The lungs are normally inflated. Mild right perihilar atelectasis. No airspace consolidation, pleural effusion, or pulmonary edema is identified. There is no pneumothorax. No acute osseous abnormality identified. IMPRESSION: 1. Mild right perihilar atelectasis. 2. No other active cardiopulmonary disease. Electronically Signed   By: Rise Mu M.D.   On: 07/27/2018 16:31    Medications:   . aspirin EC  81 mg Oral Daily  . busPIRone  15 mg Oral BID  . carvedilol  3.125 mg Oral BID  . enoxaparin (LOVENOX) injection  40 mg Subcutaneous QHS  . FLUoxetine  60 mg Oral Daily  . furosemide  40 mg Intravenous Daily  . insulin aspart  0-5 Units Subcutaneous QHS  . insulin aspart  0-9 Units Subcutaneous TID WC  . ipratropium-albuterol  3 mL Nebulization TID  . pantoprazole  40 mg Oral Daily   Continuous Infusions:   LOS: 0 days   Joseph Art  [facility]   *Please refer to [email or web address], password [email or web address] to get updated schedule on who will round on this patient, as hospitalists switch teams weekly. If 7PM-7AM, please contact night-coverage at [email or web address], password [email or web address] for any overnight needs.  07/28/2018, 11:34 AM

## 2018-07-29 ENCOUNTER — Observation Stay (HOSPITAL_COMMUNITY): Payer: Medicare Other

## 2018-07-29 DIAGNOSIS — F17210 Nicotine dependence, cigarettes, uncomplicated: Secondary | ICD-10-CM | POA: Diagnosis present

## 2018-07-29 DIAGNOSIS — I130 Hypertensive heart and chronic kidney disease with heart failure and stage 1 through stage 4 chronic kidney disease, or unspecified chronic kidney disease: Secondary | ICD-10-CM | POA: Diagnosis present

## 2018-07-29 DIAGNOSIS — Z716 Tobacco abuse counseling: Secondary | ICD-10-CM | POA: Diagnosis not present

## 2018-07-29 DIAGNOSIS — Z7982 Long term (current) use of aspirin: Secondary | ICD-10-CM | POA: Diagnosis not present

## 2018-07-29 DIAGNOSIS — R072 Precordial pain: Secondary | ICD-10-CM | POA: Diagnosis present

## 2018-07-29 DIAGNOSIS — Z833 Family history of diabetes mellitus: Secondary | ICD-10-CM | POA: Diagnosis not present

## 2018-07-29 DIAGNOSIS — M1990 Unspecified osteoarthritis, unspecified site: Secondary | ICD-10-CM | POA: Diagnosis present

## 2018-07-29 DIAGNOSIS — I5033 Acute on chronic diastolic (congestive) heart failure: Secondary | ICD-10-CM | POA: Diagnosis present

## 2018-07-29 DIAGNOSIS — E119 Type 2 diabetes mellitus without complications: Secondary | ICD-10-CM | POA: Diagnosis not present

## 2018-07-29 DIAGNOSIS — Z888 Allergy status to other drugs, medicaments and biological substances status: Secondary | ICD-10-CM | POA: Diagnosis not present

## 2018-07-29 DIAGNOSIS — Z91018 Allergy to other foods: Secondary | ICD-10-CM | POA: Diagnosis not present

## 2018-07-29 DIAGNOSIS — M25579 Pain in unspecified ankle and joints of unspecified foot: Secondary | ICD-10-CM | POA: Diagnosis present

## 2018-07-29 DIAGNOSIS — Z8249 Family history of ischemic heart disease and other diseases of the circulatory system: Secondary | ICD-10-CM | POA: Diagnosis not present

## 2018-07-29 DIAGNOSIS — J449 Chronic obstructive pulmonary disease, unspecified: Secondary | ICD-10-CM | POA: Diagnosis not present

## 2018-07-29 DIAGNOSIS — J9600 Acute respiratory failure, unspecified whether with hypoxia or hypercapnia: Secondary | ICD-10-CM | POA: Diagnosis present

## 2018-07-29 DIAGNOSIS — I480 Paroxysmal atrial fibrillation: Secondary | ICD-10-CM | POA: Diagnosis present

## 2018-07-29 DIAGNOSIS — J9811 Atelectasis: Secondary | ICD-10-CM | POA: Diagnosis present

## 2018-07-29 DIAGNOSIS — F329 Major depressive disorder, single episode, unspecified: Secondary | ICD-10-CM | POA: Diagnosis present

## 2018-07-29 DIAGNOSIS — R079 Chest pain, unspecified: Secondary | ICD-10-CM | POA: Diagnosis not present

## 2018-07-29 DIAGNOSIS — E785 Hyperlipidemia, unspecified: Secondary | ICD-10-CM | POA: Diagnosis present

## 2018-07-29 DIAGNOSIS — G8929 Other chronic pain: Secondary | ICD-10-CM | POA: Diagnosis present

## 2018-07-29 DIAGNOSIS — F419 Anxiety disorder, unspecified: Secondary | ICD-10-CM | POA: Diagnosis present

## 2018-07-29 DIAGNOSIS — G4700 Insomnia, unspecified: Secondary | ICD-10-CM | POA: Diagnosis present

## 2018-07-29 DIAGNOSIS — J439 Emphysema, unspecified: Secondary | ICD-10-CM | POA: Diagnosis present

## 2018-07-29 DIAGNOSIS — R011 Cardiac murmur, unspecified: Secondary | ICD-10-CM | POA: Diagnosis present

## 2018-07-29 DIAGNOSIS — N183 Chronic kidney disease, stage 3 (moderate): Secondary | ICD-10-CM | POA: Diagnosis present

## 2018-07-29 DIAGNOSIS — E1122 Type 2 diabetes mellitus with diabetic chronic kidney disease: Secondary | ICD-10-CM | POA: Diagnosis present

## 2018-07-29 DIAGNOSIS — F1010 Alcohol abuse, uncomplicated: Secondary | ICD-10-CM | POA: Diagnosis present

## 2018-07-29 LAB — CBC
HEMATOCRIT: 41.7 % (ref 39.0–52.0)
HEMOGLOBIN: 13 g/dL (ref 13.0–17.0)
MCH: 29.1 pg (ref 26.0–34.0)
MCHC: 31.2 g/dL (ref 30.0–36.0)
MCV: 93.3 fL (ref 80.0–100.0)
NRBC: 0 % (ref 0.0–0.2)
Platelets: 198 10*3/uL (ref 150–400)
RBC: 4.47 MIL/uL (ref 4.22–5.81)
RDW: 13.7 % (ref 11.5–15.5)
WBC: 6.5 10*3/uL (ref 4.0–10.5)

## 2018-07-29 LAB — BASIC METABOLIC PANEL
ANION GAP: 9 (ref 5–15)
BUN: 12 mg/dL (ref 6–20)
CHLORIDE: 100 mmol/L (ref 98–111)
CO2: 28 mmol/L (ref 22–32)
Calcium: 8.9 mg/dL (ref 8.9–10.3)
Creatinine, Ser: 1.32 mg/dL — ABNORMAL HIGH (ref 0.61–1.24)
GFR calc non Af Amer: 58 mL/min — ABNORMAL LOW (ref >60)
Glucose, Bld: 74 mg/dL (ref 70–99)
Potassium: 4.3 mmol/L (ref 3.5–5.1)
Sodium: 137 mmol/L (ref 135–145)

## 2018-07-29 LAB — GLUCOSE, CAPILLARY
GLUCOSE-CAPILLARY: 110 mg/dL — AB (ref 70–99)
GLUCOSE-CAPILLARY: 126 mg/dL — AB (ref 70–99)
GLUCOSE-CAPILLARY: 97 mg/dL (ref 70–99)
Glucose-Capillary: 112 mg/dL — ABNORMAL HIGH (ref 70–99)

## 2018-07-29 LAB — TROPONIN I: Troponin I: <0.03 ng/mL (ref <0.03)

## 2018-07-29 MED ORDER — PREDNISONE 20 MG PO TABS
40.0000 mg | ORAL_TABLET | Freq: Every day | ORAL | Status: DC
  Administered 2018-07-29 – 2018-07-30 (×2): 40 mg via ORAL
  Filled 2018-07-29 (×2): qty 2

## 2018-07-29 MED ORDER — NICOTINE 7 MG/24HR TD PT24
7.0000 mg | MEDICATED_PATCH | Freq: Every day | TRANSDERMAL | Status: DC
  Administered 2018-07-29 – 2018-07-30 (×2): 7 mg via TRANSDERMAL
  Filled 2018-07-29 (×2): qty 1

## 2018-07-29 MED ORDER — FUROSEMIDE 10 MG/ML IJ SOLN
40.0000 mg | Freq: Two times a day (BID) | INTRAMUSCULAR | Status: DC
  Administered 2018-07-29 – 2018-07-30 (×3): 40 mg via INTRAVENOUS
  Filled 2018-07-29: qty 4

## 2018-07-29 MED ORDER — CARVEDILOL 6.25 MG PO TABS
6.2500 mg | ORAL_TABLET | Freq: Two times a day (BID) | ORAL | Status: DC
  Administered 2018-07-29 – 2018-07-30 (×2): 6.25 mg via ORAL
  Filled 2018-07-29 (×2): qty 1

## 2018-07-29 MED ORDER — FUROSEMIDE 10 MG/ML IJ SOLN
40.0000 mg | Freq: Once | INTRAMUSCULAR | Status: AC
  Administered 2018-07-29: 40 mg via INTRAVENOUS

## 2018-07-29 MED ORDER — ASPIRIN-ACETAMINOPHEN-CAFFEINE 250-250-65 MG PO TABS
2.0000 | ORAL_TABLET | Freq: Once | ORAL | Status: AC
  Administered 2018-07-30: 2 via ORAL
  Filled 2018-07-29: qty 2

## 2018-07-29 MED ORDER — HYDRALAZINE HCL 20 MG/ML IJ SOLN
10.0000 mg | Freq: Once | INTRAMUSCULAR | Status: AC
  Administered 2018-07-29: 10 mg via INTRAVENOUS
  Filled 2018-07-29: qty 1

## 2018-07-29 MED ORDER — MORPHINE SULFATE (PF) 2 MG/ML IV SOLN: 1.0000 mg | INTRAVENOUS | Status: DC | PRN

## 2018-07-29 NOTE — Progress Notes (Signed)
Noted pt very SOB. Mini neb treatment given. Resp therapy called. Notified Rapid response and K. Schoor of [facility].. Pt was put on non rebreather mask as per rapid response. Pt slowly improving and able to breath easily . 02 shifted to [facility]. [name] sat 92 % on 2lpm. Tolerating well.

## 2018-07-29 NOTE — Progress Notes (Signed)
BP 180/101. Pt c/o headache. Tylenol given. No relief. Mso4 2mg  given. Paged K Schoor.

## 2018-07-29 NOTE — Plan of Care (Signed)
  Problem: Clinical Measurements: Goal: Diagnostic test results will improve Outcome: Progressing   Problem: Clinical Measurements: Goal: Cardiovascular complication will be avoided Outcome: Progressing   Problem: Nutrition: Goal: Adequate nutrition will be maintained Outcome: Progressing   Problem: Coping: Goal: Level of anxiety will decrease Outcome: Progressing   

## 2018-07-29 NOTE — Progress Notes (Signed)
RT called to 3E29 for pt in respiratory distress. Pt in tripod position when RT entered the room and profusely sweating and increased WOB. RT placed pt on NRB 15 LPM. Pt sats 100%, coached pt to take in deep breath through nose and purse lip blow out mouth. Pt WOB after several minutes of coaching decreased and pt began to feel much better. Rapid RN responded to room and ordered CXR and paged MD for lasix. Pt states he is feeling much better at this point. RT will continue to monitor.

## 2018-07-29 NOTE — Significant Event (Signed)
Rapid Response Event Note  Overview: Respiratory   Initial Focused Assessment: Called by RN about patient experiencing respiratory distress. Per RN, patient was up at the sink and then while ambulating back to bed, developed SOB, became panicked, and was in distress. RN administered an albuterol nebulizer x 1 and RT placed patient on NRB to help with WOB and SOB.  Upon arrival, patient was sitting up on the side of bed, tripoding, diaphoretic, mildly tachycardiac, SBP in the 160s. Patient denied chest pain but endorse being SOB. After several minutes, patient was able to get comfortable, was weaned off NRB and placed on 2L [place], saturations still > 100%. [facility] NP paged [phone] and call back received. HR was in the 110-115 range but after several minutes improved into the 90s. + Cough, history of COPD.   Interventions: -- STAT CXR -- Lasix 40 mg IV x 1 -- Strict Bedrest until breathing/respiratory status improves  Plan of Care: -- RN to follow up CXR results with [facility] MD  -- Call RRT if needed  Event Summary:    at    Call Time: 636 Arrival Time: 638 End Time 710  Jamarri Vuncannon R

## 2018-07-29 NOTE — Progress Notes (Signed)
Progress Note    Kenneth [name]  JYN:[id number] DOB: [birth year]  DOA: 07/27/2018 PCP: Mliss Sax, MD    Brief Narrative:     Medical records reviewed and are as summarized below:  Kenneth [name] is an 57 y.o. male with medical history significant for COPD, type 2 diabetes mellitus, depression with anxiety, paroxysmal atrial fibrillation recently taken off of Pradaxa, and diastolic CHF, now presenting to the emergency department for evaluation of chest pain.  Patient reports decreased exercise tolerance over the past month or so and has developed intermittent chest pain over the past couple days.  Chest pain seems to be worse after eating for the past couple days, eases off over the course of 45 minutes to a couple hours, and was relieved by nitroglycerin earlier today.  He reports some chronic ankle pain related to an injury, but denies any recent lower extremity swelling or tenderness.  Reports mild dyspnea associated with his chest pain, reports a mild chronic cough, but denies any significant shortness of breath at this time.  He took a full dose aspirin prior to arrival.  He had been on Pradaxa for paroxysmal atrial fibrillation, but was taken off of this, and also taken off of Imdur last month by his PCP.  He reports undergoing stress test and catheterization without intervention.  He was admitted to an outside hospital in August for acute CHF and found to have EF 47% at that time.  Assessment/Plan:   Principal Problem:   Chest pain Active Problems:   Essential hypertension   Chronic obstructive pulmonary disease (HCC)   Controlled type 2 diabetes mellitus without complication, without long-term current use of insulin (HCC)   Depression with anxiety   Alcohol abuse   PAF (paroxysmal atrial fibrillation) (HCC)   Mild renal insufficiency   Acute on chronic diastolic CHF (congestive heart failure) (HCC)  Acute on chronic diastolic CHF -weight in august: 147 at OSH-- here  is 170 -he stopped his ARB on his own -IV lasix BID with close monitoring of his symptoms -recent echo at OSH-- in care everywhere: mildly depressed EF of around 47% -BNP > 1000 -had episode of SOB this AM, troponin negative, x ray shows what appears to be volume overload  Dyspnea with acute respiratory failure -multifactorial-- CHF and COPD -encouraged smoking cessation -wean O2 as tolerated -increased IV lasix  -added prednisone PO    Chest pain   -cycle CE ? From volume overload vs GI from alcohol use -resolved  Paroxysmal atrial fibrillation based on documentation of March 30, 2017 by cardiology in care everywhere - CHADS-VASc at least 3 (CHF, DM, HTN)  - Previously on Pradaxa, but discontinued by PCP last month -- suspect he was unaware of a fib history - Continue ASA and beta-blocker -- defer to PCP to restart Pradaxa  Type II DM  - A1c was 6.1% last month  - Managed with metformin at home, held on admission  - SSI  COPD  -scheduled and PRN nebs -tobacco cessation  Constipation -bowel regimen  CKD stage III - monitor while getting IV lasix  Anxiety  - Continue home meds  Tobacco abuse -encouraged cessation -placed patch  Alcohol use -monitor for withdrawal  Family Communication/Anticipated D/C date and plan/Code Status   DVT prophylaxis: Lovenox ordered. Code Status: Full Code.  Family Communication: at bedside Disposition Plan: suspect d/c after IV diuresis--- 24-48 hours   Medical Consultants:    None.     Subjective:   This  AM got very short of breath-- better after lasix and breathing treatment  Objective:    Vitals:   07/29/18 0645 07/29/18 0745 07/29/18 0851 07/29/18 1146  BP:  138/77  (!) 151/102  Pulse: (!) 105 94  100  Resp: (!) 35 18  18  Temp:  (!) 97.5 F (36.4 C)  98.1 F (36.7 C)  TempSrc:  Oral  Oral  SpO2: 99% 92% 94% 96%  Weight:      Height:        Intake/Output Summary (Last 24 hours) at 07/29/2018  1232 Last data filed at 07/29/2018 1148 Gross per 24 hour  Intake 462 ml  Output 2600 ml  Net -2138 ml   Filed Weights   07/27/18 2346 07/28/18 0517 07/29/18 0400  Weight: 77.4 kg 77 kg 77.2 kg    Exam: Getting breathing treatment-- has urinated a lot since this AM's lasix dose Expiratory wheezing with crackles in bases +BS, soft No LE edema A+Ox3 Appears comfortable on O2  Data Reviewed:   I have personally reviewed following labs and imaging studies:  Labs: Labs show the following:   Basic Metabolic Panel: Recent Labs  Lab 07/27/18 1547 07/28/18 0401 07/29/18 0428  NA 141 141 137  K 4.2 4.1 4.3  CL 101 105 100  CO2 33* 30 28  GLUCOSE 100* 90 74  BUN 13 11 12   CREATININE 1.31* 1.22 1.32*  CALCIUM 9.0 8.7* 8.9   GFR Estimated Creatinine Clearance: 67.4 mL/min (A) (by C-G formula based on SCr of 1.32 mg/dL (H)). Liver Function Tests: No results for input(s): AST, ALT, ALKPHOS, BILITOT, PROT, ALBUMIN in the last 168 hours. No results for input(s): LIPASE, AMYLASE in the last 168 hours. No results for input(s): AMMONIA in the last 168 hours. Coagulation profile No results for input(s): INR, PROTIME in the last 168 hours.  CBC: Recent Labs  Lab 07/27/18 1547 07/29/18 0428  WBC 4.8 6.5  HGB 13.3 13.0  HCT 43.1 41.7  MCV 93.7 93.3  PLT 209 198   Cardiac Enzymes: Recent Labs  Lab 07/27/18 2337 07/28/18 0401 07/28/18 0913 07/29/18 0735  TROPONINI <0.03 <0.03 <0.03 <0.03   BNP (last 3 results) No results for input(s): PROBNP in the last 8760 hours. CBG: Recent Labs  Lab 07/28/18 1305 07/28/18 1645 07/28/18 2143 07/29/18 0759 07/29/18 1158  GLUCAP 85 75 100* 110* 112*   D-Dimer: No results for input(s): DDIMER in the last 72 hours. Hgb A1c: No results for input(s): HGBA1C in the last 72 hours. Lipid Profile: No results for input(s): CHOL, HDL, LDLCALC, TRIG, CHOLHDL, LDLDIRECT in the last 72 hours. Thyroid function studies: No results for  input(s): TSH, T4TOTAL, T3FREE, THYROIDAB in the last 72 hours.  Invalid input(s): FREET3 Anemia work up: No results for input(s): VITAMINB12, FOLATE, FERRITIN, TIBC, IRON, RETICCTPCT in the last 72 hours. Sepsis Labs: Recent Labs  Lab 07/27/18 1547 07/29/18 0428  WBC 4.8 6.5    Microbiology No results found for this or any previous visit (from the past 240 hour(s)).  Procedures and diagnostic studies:  Dg Chest 2 View  Result Date: 07/27/2018 CLINICAL DATA:  Initial evaluation for acute chest pain, shortness of breath. EXAM: CHEST - 2 VIEW COMPARISON:  Prior radiograph from 04/13/2016. FINDINGS: The cardiac and mediastinal silhouettes are stable in size and contour, and remain within normal limits. The lungs are normally inflated. Mild right perihilar atelectasis. No airspace consolidation, pleural effusion, or pulmonary edema is identified. There is no pneumothorax. No acute  osseous abnormality identified. IMPRESSION: 1. Mild right perihilar atelectasis. 2. No other active cardiopulmonary disease. Electronically Signed   By: Rise Mu M.D.   On: 07/27/2018 16:31   Dg Chest Port 1 View  Result Date: 07/29/2018 CLINICAL DATA:  Shortness of breath EXAM: PORTABLE CHEST 1 VIEW COMPARISON:  07/27/2018 FINDINGS: Indistinct bibasilar airspace opacities, right greater than left. Faint Kerley B-lines peripherally at the right lung base. Atherosclerotic calcification of the aortic arch. Heart size within normal limits. IMPRESSION: 1. Indistinct basilar airspace opacities, right greater than left, with faint Kerley B-lines peripherally at the right lung base but no overt cardiomegaly. The appearance could reflect noncardiogenic edema, drug reaction, early multilobar pneumonia, or aspiration pneumonitis. 2.  Aortic Atherosclerosis (ICD10-I70.0). Electronically Signed   By: Gaylyn Rong M.D.   On: 07/29/2018 07:41    Medications:   . aspirin EC  81 mg Oral Daily  .  aspirin-acetaminophen-caffeine  2 tablet Oral Once  . busPIRone  15 mg Oral BID  . carvedilol  6.25 mg Oral BID  . enoxaparin (LOVENOX) injection  40 mg Subcutaneous QHS  . FLUoxetine  60 mg Oral Daily  . furosemide  40 mg Intravenous BID  . insulin aspart  0-5 Units Subcutaneous QHS  . insulin aspart  0-9 Units Subcutaneous TID WC  . ipratropium-albuterol  3 mL Nebulization TID  . nicotine  7 mg Transdermal Daily  . pantoprazole  40 mg Oral Daily  . predniSONE  40 mg Oral Q breakfast  . senna-docusate  1 tablet Oral BID   Continuous Infusions:   LOS: 0 days   Joseph Art  [facility]   *Please refer to [email or web address], password [email or web address] to get updated schedule on who will round on this patient, as hospitalists switch teams weekly. If 7PM-7AM, please contact night-coverage at [email or web address], password [email or web address] for any overnight needs.  07/29/2018, 12:32 PM

## 2018-07-29 NOTE — Plan of Care (Signed)
  Problem: Clinical Measurements: Goal: Respiratory complications will improve Outcome: Progressing   Problem: Clinical Measurements: Goal: Diagnostic test results will improve Outcome: Progressing   Problem: Activity: Goal: Risk for activity intolerance will decrease Outcome: Progressing   Problem: Coping: Goal: Level of anxiety will decrease Outcome: Progressing   Problem: Safety: Goal: Ability to remain free from injury will improve Outcome: Progressing

## 2018-07-30 DIAGNOSIS — I209 Angina pectoris, unspecified: Secondary | ICD-10-CM

## 2018-07-30 LAB — BASIC METABOLIC PANEL
Anion gap: 9 (ref 5–15)
BUN: 17 mg/dL (ref 6–20)
CO2: 31 mmol/L (ref 22–32)
Calcium: 9.2 mg/dL (ref 8.9–10.3)
Chloride: 99 mmol/L (ref 98–111)
Creatinine, Ser: 1.49 mg/dL — ABNORMAL HIGH (ref 0.61–1.24)
GFR, EST AFRICAN AMERICAN: 58 mL/min — AB (ref >60)
GFR, EST NON AFRICAN AMERICAN: 50 mL/min — AB (ref >60)
Glucose, Bld: 81 mg/dL (ref 70–99)
Potassium: 3.4 mmol/L — ABNORMAL LOW (ref 3.5–5.1)
Sodium: 139 mmol/L (ref 135–145)

## 2018-07-30 LAB — CBC
HCT: 42.4 % (ref 39.0–52.0)
Hemoglobin: 13.7 g/dL (ref 13.0–17.0)
MCH: 29.6 pg (ref 26.0–34.0)
MCHC: 32.3 g/dL (ref 30.0–36.0)
MCV: 91.6 fL (ref 80.0–100.0)
NRBC: 0 % (ref 0.0–0.2)
PLATELETS: 202 10*3/uL (ref 150–400)
RBC: 4.63 MIL/uL (ref 4.22–5.81)
RDW: 13.7 % (ref 11.5–15.5)
WBC: 6.3 10*3/uL (ref 4.0–10.5)

## 2018-07-30 LAB — GLUCOSE, CAPILLARY
GLUCOSE-CAPILLARY: 104 mg/dL — AB (ref 70–99)
Glucose-Capillary: 146 mg/dL — ABNORMAL HIGH (ref 70–99)

## 2018-07-30 MED ORDER — NICOTINE 7 MG/24HR TD PT24: 7.0000 mg | MEDICATED_PATCH | Freq: Every day | TRANSDERMAL | 0 refills | Status: DC

## 2018-07-30 MED ORDER — IPRATROPIUM-ALBUTEROL 0.5-2.5 (3) MG/3ML IN SOLN: 3.0000 mL | Freq: Two times a day (BID) | RESPIRATORY_TRACT | 0 refills | Status: DC | PRN

## 2018-07-30 MED ORDER — POTASSIUM CHLORIDE CRYS ER 20 MEQ PO TBCR
40.0000 meq | EXTENDED_RELEASE_TABLET | Freq: Once | ORAL | Status: AC
  Administered 2018-07-30: 40 meq via ORAL
  Filled 2018-07-30: qty 2

## 2018-07-30 MED ORDER — ZOLPIDEM TARTRATE ER 12.5 MG PO TBCR: 12.5000 mg | EXTENDED_RELEASE_TABLET | Freq: Every evening | ORAL | 0 refills | Status: DC | PRN

## 2018-07-30 MED ORDER — CARVEDILOL 3.125 MG PO TABS: 3.1250 mg | ORAL_TABLET | Freq: Two times a day (BID) | ORAL | 3 refills | Status: DC

## 2018-07-30 NOTE — Discharge Instructions (Signed)
Chest Wall Pain Chest wall pain is pain in or around the bones and muscles of your chest. Sometimes, an injury causes this pain. Sometimes, the cause may not be known. This pain may take several weeks or longer to get better. Follow these instructions at home: Pay attention to any changes in your symptoms. Take these actions to help with your pain:  Rest as told by your doctor.  Avoid activities that cause pain. Try not to use your chest, belly (abdominal), or side muscles to lift heavy things.  If directed, apply ice to the painful area: ? Put ice in a plastic bag. ? Place a towel between your skin and the bag. ? Leave the ice on for 20 minutes, 2-3 times per day.  Take over-the-counter and prescription medicines only as told by your doctor.  Do not use tobacco products, including cigarettes, chewing tobacco, and e-cigarettes. If you need help quitting, ask your doctor.  Keep all follow-up visits as told by your doctor. This is important.  Contact a doctor if:  You have a fever.  Your chest pain gets worse.  You have new symptoms. Get help right away if:  You feel sick to your stomach (nauseous) or you throw up (vomit).  You feel sweaty or light-headed.  You have a cough with phlegm (sputum) or you cough up blood.  You are short of breath. This information is not intended to replace advice given to you by your health care provider. Make sure you discuss any questions you have with your health care provider. Document Released: 02/25/2008 Document Revised: 02/14/2016 Document Reviewed: 12/04/2014 Elsevier Interactive Patient Education  2018 [employer].   Shortness of Breath, Adult Shortness of breath is when a person has trouble breathing enough air, or when a person feels like she or he is having trouble breathing in enough air. Shortness of breath could be a sign of medical problem. Follow these instructions at home: Pay attention to any changes in your symptoms. Take  these actions to help with your condition:  Do not smoke. Smoking is a common cause of shortness of breath. If you smoke and you need help quitting, ask your health care provider.  Avoid things that can irritate your airways, such as: ? Mold. ? Dust. ? Air pollution. ? Chemical fumes. ? Things that can cause allergy symptoms (allergens), if you have allergies.  Keep your living space clean and free of mold and dust.  Rest as needed. Slowly return to your usual activities.  Take over-the-counter and prescription medicines, including oxygen and inhaled medicines, only as told by your health care provider.  Keep all follow-up visits as told by your health care provider. This is important.  Contact a health care provider if:  Your condition does not improve as soon as expected.  You have a hard time doing your normal activities, even after you rest.  You have new symptoms. Get help right away if:  Your shortness of breath gets worse.  You have shortness of breath when you are resting.  You feel light-headed or you faint.  You have a cough that is not controlled with medicines.  You cough up blood.  You have pain with breathing.  You have pain in your chest, arms, shoulders, or abdomen.  You have a fever.  You cannot walk up stairs or exercise the way that you normally do. This information is not intended to replace advice given to you by your health care provider. Make sure you  discuss any questions you have with your health care provider. Document Released: 06/03/2001 Document Revised: 03/29/2016 Document Reviewed: 02/14/2016 Elsevier Interactive Patient Education  [employer].

## 2018-07-30 NOTE — Discharge Summary (Addendum)
Discharge Summary  Kenneth [name] ZOX:[id number] DOB: 11-12-1960  PCP: Mliss Sax, MD  Admit date: 07/27/2018 Discharge date: 07/30/2018  Time spent: 35 minutes  Recommendations for Outpatient Follow-up:  1. Follow-up with your primary care provider 2. Follow-up with your cardiologist 3. Take your medications as prescribed  Discharge Diagnoses:  Active Hospital Problems   Diagnosis Date Noted  . Chest pain 07/27/2018  . Acute on chronic diastolic CHF (congestive heart failure) (HCC) 07/28/2018  . PAF (paroxysmal atrial fibrillation) (HCC) 07/27/2018  . Mild renal insufficiency 07/27/2018  . Chronic obstructive pulmonary disease (HCC) 07/06/2018  . Controlled type 2 diabetes mellitus without complication, without long-term current use of insulin (HCC) 07/06/2018  . Depression with anxiety 07/06/2018  . Essential hypertension 07/06/2018  . Alcohol abuse 07/06/2018    Resolved Hospital Problems  No resolved problems to display.    Discharge Condition: Stable  Diet recommendation: Resume previous diet  Vitals:   07/30/18 0807 07/30/18 0859  BP: 112/62   Pulse: 75   Resp:    Temp:    SpO2:  96%    History of present illness:  Kenneth [name] is an 57 y.o. male with medical history significant forCOPD, type 2 diabetes mellitus, depression, anxiety, paroxysmal atrial fibrillation recently taken off of Pradaxa for unclear reason, and diastolic CHF, now presenting to the emergency department for evaluation of chest pain. Patient reports decreased exercise tolerance over the past month or so and has developed intermittent chest pain over the past couple days. Chest pain seems to be worse after eating for the past couple days, eases off over the course of 45 minutes to a couple hours, and was relieved by nitroglycerin earlier today. He reports some chronic ankle pain related to an injury, but denies any recent lower extremity swelling or tenderness. Reports mild dyspnea  associated with his chest pain, reports a mild chronic cough, but denies any significant shortness of breath at this time. He took a full dose aspirin prior to arrival. He had been on Pradaxa for paroxysmal atrial fibrillation, but was taken off of this, and also taken off of Imdur last month by his PCP. He reports undergoing stress test and catheterization without intervention.He was admitted to an outside hospital in August for acute CHF and found to have EF 47% at that time.  Troponin negative x3 with sinus rhythm and no specific ST-T changes on twelve-lead EKG.  BNP elevated at greater than 1300 with suspicion for acute on chronic diastolic CHF.  Diuresis with IV Lasix with good urine output and improvement of symptoms.  Negative greater than 2600 on 07/30/2018.  07/30/2018: Patient was seen and examined at his bedside.  He denies any chest pain, palpitations or dyspnea.  No acute events overnight.  Home O2 evaluation ordered. Patient is saturating 96 to 98% on room air at rest.  Vital signs are stable.  Blood pressure 112/62, heart rate 75, respiration rate 18. Weight down 3 kg from 77 kg to 74 kg.  On the day of discharge, the patient was hemodynamically stable.  He will need to follow-up with his primary care provider and cardiologist posthospitalization.  Hospital Course:  Principal Problem:   Chest pain Active Problems:   Essential hypertension   Chronic obstructive pulmonary disease (HCC)   Controlled type 2 diabetes mellitus without complication, without long-term current use of insulin (HCC)   Depression with anxiety   Alcohol abuse   PAF (paroxysmal atrial fibrillation) (HCC)   Mild renal insufficiency  Acute on chronic diastolic CHF (congestive heart failure) (HCC)  Chest pain rule out ACS ACS ruled out with negative troponin and unremarkable twelve-lead EKG Suspected acute on chronic systolic CHF  Acute on chronic diastolic CHF Elevated BNP greater than 2600 Diuresis  with IV Lasix twice daily with good urine output Follow-up with your cardiologist Take your medications as prescribed  COPD the setting of tobacco use disorder Smoking cessation counseling Passed home O2 evaluation  Paroxysmal A. fib Chads Vascor 3 Previously on Pradaxa but discontinued by PCP last month Defer to PCP to restart Pradaxa  CKD 3 Baseline creatinine 1.3 with GFR greater than 60 Creatinine increased to 1.49 Recommend follow-up with PCP  Insomnia Ambien qhs prn F/u with your pcp   Procedures:  None  Consultations:  None  Discharge Exam: BP 112/62   Pulse 75   Temp 97.7 F (36.5 C) (Oral)   Resp 18   Ht 6' (1.829 m)   Wt 74.2 kg   SpO2 96%   BMI 22.17 kg/m  . General: 57 y.o. year-old male well developed well nourished in no acute distress.  Alert and oriented x3. . Cardiovascular: Regular rate and rhythm with no rubs or gallops.  No thyromegaly or JVD noted.   [name] Respiratory: Clear to auscultation with no wheezes or rales. Good inspiratory effort. . Abdomen: Soft nontender nondistended with normal bowel sounds x4 quadrants. . Musculoskeletal: No lower extremity edema. 2/4 pulses in all 4 extremities. . Skin: No ulcerative lesions noted or rashes, . Psychiatry: Mood is appropriate for condition and setting  Discharge Instructions You were cared for by a hospitalist during your hospital stay. If you have any questions about your discharge medications or the care you received while you were in the hospital after you are discharged, you can call the unit and asked to speak with the hospitalist on call if the hospitalist that took care of you is not available. Once you are discharged, your primary care physician will handle any further medical issues. Please note that NO REFILLS for any discharge medications will be authorized once you are discharged, as it is imperative that you return to your primary care physician (or establish a relationship with a primary  care physician if you do not have one) for your aftercare needs so that they can reassess your need for medications and monitor your lab values.  Discharge Instructions    DME Nebulizer machine   Complete by:  As directed    Patient needs a nebulizer to treat with the following condition:  COPD (chronic obstructive pulmonary disease) (HCC)     Allergies as of 07/30/2018      Reactions   Lisinopril Swelling   Tomato Rash      Medication List    STOP taking these medications   losartan-hydrochlorothiazide 100-25 MG tablet Commonly known as:  HYZAAR   metFORMIN 500 MG tablet Commonly known as:  GLUCOPHAGE     TAKE these medications   albuterol (2.5 MG/3ML) 0.083% nebulizer solution Commonly known as:  PROVENTIL Take 2.5 mg by nebulization 3 (three) times daily.   albuterol 108 (90 Base) MCG/ACT inhaler Commonly known as:  PROVENTIL HFA;VENTOLIN HFA Inhale 1 puff into the lungs as needed for wheezing.   aspirin EC 81 MG tablet Take 1 tablet by mouth daily.   busPIRone 15 MG tablet Commonly known as:  BUSPAR Take 1 tablet (15 mg total) by mouth 2 (two) times daily.   carvedilol 3.125 MG tablet Commonly known  as:  COREG Take 1 tablet (3.125 mg total) by mouth 2 (two) times daily.   FLUoxetine HCl 60 MG Tabs Take 1 tablet by mouth daily.   furosemide 20 MG tablet Commonly known as:  LASIX Take 1 tablet (20 mg total) by mouth daily.   ipratropium-albuterol 0.5-2.5 (3) MG/3ML Soln Commonly known as:  DUONEB Take 3 mLs by nebulization 2 (two) times daily as needed.   nicotine 7 mg/24hr patch Commonly known as:  NICODERM CQ - dosed in mg/24 hr Place 1 patch (7 mg total) onto the skin daily. Start taking on:  07/31/2018   nitroGLYCERIN 0.4 MG SL tablet Commonly known as:  NITROSTAT Place 0.4 mg under the tongue every 5 (five) minutes as needed for chest pain.   polyethylene glycol powder powder Commonly known as:  GLYCOLAX/MIRALAX Take 17 g by mouth 2 (two) times  daily as needed.   zolpidem 12.5 MG CR tablet Commonly known as:  AMBIEN CR Take 1 tablet (12.5 mg total) by mouth at bedtime as needed for sleep.            Durable Medical Equipment  (From admission, onward)         Start     Ordered   07/30/18 0000  DME Nebulizer machine    Question:  Patient needs a nebulizer to treat with the following condition  Answer:  COPD (chronic obstructive pulmonary disease) (HCC)   07/30/18 1336         Allergies  Allergen Reactions  . Lisinopril Swelling  . Tomato Rash   Follow-up Information    Mliss Sax, MD. Go on 08/03/2018.   Specialty:  Family Medicine Why:  Please call for a post hospital appointment within a week.@1 :30pm Contact information: [address] [place] [address] [phone]            The results of significant diagnostics from this hospitalization (including imaging, microbiology, ancillary and laboratory) are listed below for reference.    Significant Diagnostic Studies: Dg Chest 2 View  Result Date: 07/27/2018 CLINICAL DATA:  Initial evaluation for acute chest pain, shortness of breath. EXAM: CHEST - 2 VIEW COMPARISON:  Prior radiograph from 04/13/2016. FINDINGS: The cardiac and mediastinal silhouettes are stable in size and contour, and remain within normal limits. The lungs are normally inflated. Mild right perihilar atelectasis. No airspace consolidation, pleural effusion, or pulmonary edema is identified. There is no pneumothorax. No acute osseous abnormality identified. IMPRESSION: 1. Mild right perihilar atelectasis. 2. No other active cardiopulmonary disease. Electronically Signed   By: Rise Mu M.D.   On: 07/27/2018 16:31   Dg Chest Port 1 View  Result Date: 07/29/2018 CLINICAL DATA:  Shortness of breath EXAM: PORTABLE CHEST 1 VIEW COMPARISON:  07/27/2018 FINDINGS: Indistinct bibasilar airspace opacities, right greater than left. Faint Kerley B-lines peripherally  at the right lung base. Atherosclerotic calcification of the aortic arch. Heart size within normal limits. IMPRESSION: 1. Indistinct basilar airspace opacities, right greater than left, with faint Kerley B-lines peripherally at the right lung base but no overt cardiomegaly. The appearance could reflect noncardiogenic edema, drug reaction, early multilobar pneumonia, or aspiration pneumonitis. 2.  Aortic Atherosclerosis (ICD10-I70.0). Electronically Signed   By: Gaylyn Rong M.D.   On: 07/29/2018 07:41    Microbiology: No results found for this or any previous visit (from the past 240 hour(s)).   Labs: Basic Metabolic Panel: Recent Labs  Lab 07/27/18 1547 07/28/18 0401 07/29/18 0428 07/30/18 0319  NA 141 141  137 139  K 4.2 4.1 4.3 3.4*  CL 101 105 100 99  CO2 33* 30 28 31   GLUCOSE 100* 90 74 81  BUN 13 11 12 17   CREATININE 1.31* 1.22 1.32* 1.49*  CALCIUM 9.0 8.7* 8.9 9.2   Liver Function Tests: No results for input(s): AST, ALT, ALKPHOS, BILITOT, PROT, ALBUMIN in the last 168 hours. No results for input(s): LIPASE, AMYLASE in the last 168 hours. No results for input(s): AMMONIA in the last 168 hours. CBC: Recent Labs  Lab 07/27/18 1547 07/29/18 0428 07/30/18 0319  WBC 4.8 6.5 6.3  HGB 13.3 13.0 13.7  HCT 43.1 41.7 42.4  MCV 93.7 93.3 91.6  PLT 209 198 202   Cardiac Enzymes: Recent Labs  Lab 07/27/18 2337 07/28/18 0401 07/28/18 0913 07/29/18 0735  TROPONINI <0.03 <0.03 <0.03 <0.03   BNP: BNP (last 3 results) Recent Labs    07/28/18 0913  BNP 1,379.1*    ProBNP (last 3 results) No results for input(s): PROBNP in the last 8760 hours.  CBG: Recent Labs  Lab 07/29/18 1158 07/29/18 1620 07/29/18 2145 07/30/18 0744 07/30/18 1127  GLUCAP 112* 126* 97 146* 104*       Signed:  Darlin Drop, MD [facility] 07/30/2018, 1:52 PM

## 2018-07-30 NOTE — Progress Notes (Signed)
SATURATION QUALIFICATIONS: (This note is used to comply with regulatory documentation for home oxygen)  Patient Saturations on Room Air at Rest = 100%  Patient Saturations on Room Air while Ambulating = 100%  Patient Saturations on 0 Liters of oxygen while Ambulating = 100%  Please briefly explain why patient needs home oxygen: pt does not need home oxygen 

## 2018-08-02 ENCOUNTER — Telehealth: Payer: Self-pay | Admitting: Behavioral Health

## 2018-08-02 NOTE — Telephone Encounter (Signed)
Unable to reach patient at time of TCM/Hospital Follow-up call. Left message for patient to return call when available.  

## 2018-08-03 ENCOUNTER — Telehealth: Payer: Self-pay

## 2018-08-03 ENCOUNTER — Inpatient Hospital Stay: Payer: Medicare Other | Admitting: Family Medicine

## 2018-08-03 NOTE — Telephone Encounter (Signed)
Okay to change. I need to see him to put him back on some meds that I had taken him off of.

## 2018-08-03 NOTE — Telephone Encounter (Signed)
Okay to change these?   Copied from CRM [id number]. Topic: Quick Communication - See Telephone Encounter >> Aug 03, 2018  3:35 PM Waymon Amato wrote: [facility] is [facility] stating that due to insurance coverage the rx for albuteral and ipratropium need to be written and albuterol 5 times a day and the lpratropium 2 times a day   Best number [phone] and fax is [phone]

## 2018-08-04 MED ORDER — ALBUTEROL SULFATE (2.5 MG/3ML) 0.083% IN NEBU: 2.5000 mg | INHALATION_SOLUTION | Freq: Every day | RESPIRATORY_TRACT | 3 refills | Status: DC | PRN

## 2018-08-04 MED ORDER — IPRATROPIUM-ALBUTEROL 0.5-2.5 (3) MG/3ML IN SOLN: 3.0000 mL | Freq: Two times a day (BID) | RESPIRATORY_TRACT | 0 refills | Status: DC | PRN

## 2018-08-04 NOTE — Telephone Encounter (Signed)
Rx's sent. Patient has appointment on 11/19.

## 2018-08-04 NOTE — Addendum Note (Signed)
Addended by: Marcell Anger E on: 08/04/2018 09:07 AM   Modules accepted: Orders

## 2018-08-04 NOTE — Telephone Encounter (Signed)
Attempted to reach patient x 2 for TCM/Hospital Follow-up call. Left message for a call back. Per patient's chart, he's scheduled for Hospital Follow-up visit on 08/10/18 at 9:30 AM.

## 2018-08-06 ENCOUNTER — Telehealth: Payer: Self-pay | Admitting: Family Medicine

## 2018-08-06 NOTE — Telephone Encounter (Signed)
Patient needs to see me.

## 2018-08-06 NOTE — Telephone Encounter (Signed)
Patient called and asked about an explanation of wanting another medication besides Fluoxetine HCL. He says that medication is too expensive for him with his current insurance and would like to have another medication that is less expensive. I asked did he receive the medication that was refilled on 07/20/18, he says no, he did not receive those because of the cost.  I advised I will call the pharmacist to see which medication is equivalent. I called [facility] and spoke to [place], [facility] about the above. He says the patient's insurance does not cover the Fluoxetine tablets and he has to pay for it out of pocket. He says there is a zero co-pay on the Fluoxetine capsules, however the capsules are only supplied in 10, 20, 40 mg and the patient will have to take 3 capsules to equal the 60 mg tablet. He also says under the same class of medication the provider can choose either Lexapro or Celexa, which both is a zero co-pay to the patient. He also mentioned the [facility] class of medications and says they work similar to the Prozac class.

## 2018-08-06 NOTE — Telephone Encounter (Signed)
Copied from CRM [id number]. Topic: Quick Communication - Rx Refill/Question >> Aug 06, 2018  9:40 AM Gloriann Loan L wrote: Medication: FLUoxetine HCl 60 MG TABS or something that's covered under the insurance [employer] she doesn't have any money to spend  Has the patient contacted their pharmacy? No. (Agent: If no, request that the patient contact the pharmacy for the refill.) (Agent: If yes, when and what did the pharmacy advise?)  Preferred Pharmacy (with phone number or street name): [facility] - [place], [place] - [phone] (Phone) [phone] (Fax)    Agent: Please be advised that RX refills may take up to 3 business days. We ask that you follow-up with your pharmacy.

## 2018-08-06 NOTE — Telephone Encounter (Signed)
Has an appointment scheduled for 08/10/18

## 2018-08-06 NOTE — Telephone Encounter (Signed)
Pt calling back about the rx for albuteral and ipratropium please call him at [phone] he states that [facility] called about this RX

## 2018-08-10 ENCOUNTER — Ambulatory Visit (INDEPENDENT_AMBULATORY_CARE_PROVIDER_SITE_OTHER): Payer: Medicare Other | Admitting: Family Medicine

## 2018-08-10 ENCOUNTER — Telehealth: Payer: Self-pay | Admitting: Family Medicine

## 2018-08-10 ENCOUNTER — Encounter: Payer: Self-pay | Admitting: Family Medicine

## 2018-08-10 VITALS — BP 130/78 | HR 77 | Temp 97.4°F | Ht 72.0 in | Wt 175.5 lb

## 2018-08-10 DIAGNOSIS — I480 Paroxysmal atrial fibrillation: Secondary | ICD-10-CM

## 2018-08-10 DIAGNOSIS — N289 Disorder of kidney and ureter, unspecified: Secondary | ICD-10-CM | POA: Diagnosis not present

## 2018-08-10 DIAGNOSIS — R079 Chest pain, unspecified: Secondary | ICD-10-CM

## 2018-08-10 DIAGNOSIS — I10 Essential (primary) hypertension: Secondary | ICD-10-CM

## 2018-08-10 DIAGNOSIS — E119 Type 2 diabetes mellitus without complications: Secondary | ICD-10-CM

## 2018-08-10 DIAGNOSIS — K219 Gastro-esophageal reflux disease without esophagitis: Secondary | ICD-10-CM | POA: Insufficient documentation

## 2018-08-10 DIAGNOSIS — F418 Other specified anxiety disorders: Secondary | ICD-10-CM

## 2018-08-10 DIAGNOSIS — Z720 Tobacco use: Secondary | ICD-10-CM | POA: Insufficient documentation

## 2018-08-10 LAB — BASIC METABOLIC PANEL
BUN: 12 mg/dL (ref 6–23)
CO2: 29 meq/L (ref 19–32)
CREATININE: 1.22 mg/dL (ref 0.40–1.50)
Calcium: 9.3 mg/dL (ref 8.4–10.5)
Chloride: 102 mEq/L (ref 96–112)
GFR: 78.72 mL/min (ref >60.00)
GLUCOSE: 95 mg/dL (ref 70–99)
Potassium: 4.3 mEq/L (ref 3.5–5.1)
Sodium: 139 mEq/L (ref 135–145)

## 2018-08-10 MED ORDER — DABIGATRAN ETEXILATE MESYLATE 150 MG PO CAPS: 150.0000 mg | ORAL_CAPSULE | Freq: Two times a day (BID) | ORAL | 0 refills | Status: DC

## 2018-08-10 MED ORDER — FAMOTIDINE 20 MG PO TABS: 20.0000 mg | ORAL_TABLET | Freq: Two times a day (BID) | ORAL | 1 refills | Status: DC

## 2018-08-10 MED ORDER — AMLODIPINE BESYLATE 5 MG PO TABS: 5.0000 mg | ORAL_TABLET | Freq: Every day | ORAL | 3 refills | Status: DC

## 2018-08-10 MED ORDER — FLUOXETINE HCL (PMDD) 20 MG PO CAPS: 3.0000 | ORAL_CAPSULE | Freq: Every day | ORAL | 1 refills | Status: DC

## 2018-08-10 MED ORDER — NITROGLYCERIN 0.4 MG SL SUBL: 0.4000 mg | SUBLINGUAL_TABLET | SUBLINGUAL | 5 refills | Status: DC | PRN

## 2018-08-10 MED ORDER — FUROSEMIDE 20 MG PO TABS: 20.0000 mg | ORAL_TABLET | Freq: Every day | ORAL | 3 refills | Status: DC

## 2018-08-10 MED ORDER — FAMOTIDINE 20 MG PO TABS: 20.0000 mg | ORAL_TABLET | Freq: Every day | ORAL | 2 refills | Status: DC

## 2018-08-10 MED ORDER — METFORMIN HCL 500 MG PO TABS: 500.0000 mg | ORAL_TABLET | Freq: Two times a day (BID) | ORAL | 3 refills | Status: DC

## 2018-08-10 MED ORDER — CARVEDILOL 3.125 MG PO TABS: 3.1250 mg | ORAL_TABLET | Freq: Two times a day (BID) | ORAL | 3 refills | Status: DC

## 2018-08-10 MED ORDER — DABIGATRAN ETEXILATE MESYLATE 150 MG PO CAPS: 150.0000 mg | ORAL_CAPSULE | Freq: Two times a day (BID) | ORAL | 1 refills | Status: DC

## 2018-08-10 MED ORDER — BUSPIRONE HCL 15 MG PO TABS: 15.0000 mg | ORAL_TABLET | Freq: Two times a day (BID) | ORAL | 1 refills | Status: DC

## 2018-08-10 NOTE — Patient Instructions (Signed)
Coping with Quitting Smoking Quitting smoking is a physical and mental challenge. You will face cravings, withdrawal symptoms, and temptation. Before quitting, work with your health care provider to make a plan that can help you cope. Preparation can help you quit and keep you from giving in. How can I cope with cravings? Cravings usually last for 5-10 minutes. If you get through it, the craving will pass. Consider taking the following actions to help you cope with cravings:  Keep your mouth busy: ? Chew sugar-free gum. ? Suck on hard candies or a straw. ? Brush your teeth.  Keep your hands and body busy: ? Immediately change to a different activity when you feel a craving. ? Squeeze or play with a ball. ? Do an activity or a hobby, like making bead jewelry, practicing needlepoint, or working with wood. ? Mix up your normal routine. ? Take a short exercise break. Go for a quick walk or run up and down stairs. ? Spend time in public places where smoking is not allowed.  Focus on doing something kind or helpful for someone else.  Call a friend or family member to talk during a craving.  Join a support group.  Call a quit line, such as 1-800-QUIT-NOW.  Talk with your health care provider about medicines that might help you cope with cravings and make quitting easier for you.  How can I deal with withdrawal symptoms? Your body may experience negative effects as it tries to get used to not having nicotine in the system. These effects are called withdrawal symptoms. They may include:  Feeling hungrier than normal.  Trouble concentrating.  Irritability.  Trouble sleeping.  Feeling depressed.  Restlessness and agitation.  Craving a cigarette.  To manage withdrawal symptoms:  Avoid places, people, and activities that trigger your cravings.  Remember why you want to quit.  Get plenty of sleep.  Avoid coffee and other caffeinated drinks. These may worsen some of your  symptoms.  How can I handle social situations? Social situations can be difficult when you are quitting smoking, especially in the first few weeks. To manage this, you can:  Avoid parties, bars, and other social situations where people might be smoking.  Avoid alcohol.  Leave right away if you have the urge to smoke.  Explain to your family and friends that you are quitting smoking. Ask for understanding and support.  Plan activities with friends or family where smoking is not an option.  What are some ways I can cope with stress? Wanting to smoke may cause stress, and stress can make you want to smoke. Find ways to manage your stress. Relaxation techniques can help. For example:  Breathe slowly and deeply, in through your nose and out through your mouth.  Listen to soothing, relaxing music.  Talk with a family member or friend about your stress.  Light a candle.  Soak in a bath or take a shower.  Think about a peaceful place.  What are some ways I can prevent weight gain? Be aware that many people gain weight after they quit smoking. However, not everyone does. To keep from gaining weight, have a plan in place before you quit and stick to the plan after you quit. Your plan should include:  Having healthy snacks. When you have a craving, it may help to: ? Eat plain popcorn, crunchy carrots, celery, or other cut vegetables. ? Chew sugar-free gum.  Changing how you eat: ? Eat small portion sizes at meals. ?   Eat 4-6 small meals throughout the day instead of 1-2 large meals a day. ? Be mindful when you eat. Do not watch television or do other things that might distract you as you eat.  Exercising regularly: ? Make time to exercise each day. If you do not have time for a long workout, do short bouts of exercise for 5-10 minutes several times a day. ? Do some form of strengthening exercise, like weight lifting, and some form of aerobic exercise, like running or  swimming.  Drinking plenty of water or other low-calorie or no-calorie drinks. Drink 6-8 glasses of water daily, or as much as instructed by your health care provider.  Summary  Quitting smoking is a physical and mental challenge. You will face cravings, withdrawal symptoms, and temptation to smoke again. Preparation can help you as you go through these challenges.  You can cope with cravings by keeping your mouth busy (such as by chewing gum), keeping your body and hands busy, and making calls to family, friends, or a helpline for people who want to quit smoking.  You can cope with withdrawal symptoms by avoiding places where people smoke, avoiding drinks with caffeine, and getting plenty of rest.  Ask your health care provider about the different ways to prevent weight gain, avoid stress, and handle social situations. This information is not intended to replace advice given to you by your health care provider. Make sure you discuss any questions you have with your health care provider. Document Released: 09/05/2016 Document Revised: 09/05/2016 Document Reviewed: 09/05/2016 Elsevier Interactive Patient Education  2018 [employer]  

## 2018-08-10 NOTE — Progress Notes (Signed)
Subjective:  Patient ID: Kenneth [name], male    DOB: [birth year]  Age: 57 y.o. MRN: [id number]  CC: Hospitalization Follow-up   HPI Tushar Enns presents for follow-up status post hospitalization for COPD exacerbation.  Patient continues to smoke.  Hospitalist to discovered that patient had a past medical history of PAF that I did not see when I first encountered the patient.  There were multiple comments in the medical record  about how I had discontinued the Pradaxa for "an unclear reason". This was stated despite the fact that  I had clearly discussed this with the patient as to why he had been taking it and he did not know at that time as well.  It would appear that the hospitalist had not seen my note when he admitted the patient.  Patient's anxiety and depression have been controlled with Prozac and BuSpar.  He uses Ambien rarely to help him sleep.  Metformin 500 twice daily is controlled his diabetes well.  His current renal function still supports the use of that medication.  Patient will see cardiology this coming week.  He has for refill on his nitroglycerin because his current prescription is old.  He tells me that he uses that on occasion but would not be specific as to how often.  Outpatient Medications Prior to Visit  Medication Sig Dispense Refill  . albuterol (PROVENTIL HFA;VENTOLIN HFA) 108 (90 Base) MCG/ACT inhaler Inhale 1 puff into the lungs as needed for wheezing.     [name] albuterol (PROVENTIL) (2.5 MG/3ML) 0.083% nebulizer solution Take 3 mLs (2.5 mg total) by nebulization 5 (five) times daily as needed for wheezing or shortness of breath. 150 mL 3  . aspirin EC 81 MG tablet Take 1 tablet by mouth daily.    [name] ipratropium-albuterol (DUONEB) 0.5-2.5 (3) MG/3ML SOLN Take 3 mLs by nebulization 2 (two) times daily as needed. 360 mL 0  . nicotine (NICODERM CQ - DOSED IN MG/24 HR) 7 mg/24hr patch Place 1 patch (7 mg total) onto the skin daily. 28 patch 0  . polyethylene glycol powder  (GLYCOLAX/MIRALAX) powder Take 17 g by mouth 2 (two) times daily as needed. 3350 g 1  . zolpidem (AMBIEN CR) 12.5 MG CR tablet Take 1 tablet (12.5 mg total) by mouth at bedtime as needed for sleep. 15 tablet 0  . busPIRone (BUSPAR) 15 MG tablet Take 1 tablet (15 mg total) by mouth 2 (two) times daily. 180 tablet 1  . carvedilol (COREG) 3.125 MG tablet Take 1 tablet (3.125 mg total) by mouth 2 (two) times daily. 180 tablet 3  . FLUoxetine HCl 60 MG TABS Take 1 tablet by mouth daily. 90 tablet 1  . furosemide (LASIX) 20 MG tablet Take 1 tablet (20 mg total) by mouth daily. 90 tablet 3  . nitroGLYCERIN (NITROSTAT) 0.4 MG SL tablet Place 0.4 mg under the tongue every 5 (five) minutes as needed for chest pain.     No facility-administered medications prior to visit.     ROS Review of Systems  Constitutional: Negative.   HENT: Negative.   Respiratory: Negative for chest tightness, shortness of breath and wheezing.   Cardiovascular: Negative for chest pain, palpitations and leg swelling.  Gastrointestinal: Negative.   Endocrine: Negative for polyphagia and polyuria.  Genitourinary: Negative.   Musculoskeletal: Negative for gait problem and joint swelling.  Skin: Negative for pallor.  Allergic/Immunologic: Negative for immunocompromised state.  Neurological: Negative for speech difficulty, light-headedness and headaches.  Hematological: Does not bruise/bleed easily.  Psychiatric/Behavioral: Negative.     Objective:  BP 130/78 (BP Location: Left Arm, Patient Position: Sitting, Cuff Size: Normal)   Pulse 77   Temp (!) 97.4 F (36.3 C) (Oral)   Ht 6' (1.829 m)   Wt 175 lb 8 oz (79.6 kg)   SpO2 96%   BMI 23.80 kg/m   BP Readings from Last 3 Encounters:  08/10/18 130/78  07/30/18 112/62  07/20/18 130/80    Wt Readings from Last 3 Encounters:  08/10/18 175 lb 8 oz (79.6 kg)  07/30/18 163 lb 8 oz (74.2 kg)  07/20/18 164 lb (74.4 kg)    Physical Exam  Constitutional: He is  oriented to person, place, and time. He appears well-nourished. No distress.  HENT:  Head: Normocephalic and atraumatic.  Right Ear: External ear normal.  Left Ear: External ear normal.  Mouth/Throat: Oropharynx is clear and moist.  Eyes: Pupils are equal, round, and reactive to light. Conjunctivae and EOM are normal. Right eye exhibits no discharge. Left eye exhibits no discharge. No scleral icterus.  Neck: Neck supple. No JVD present. No tracheal deviation present. No thyromegaly present.  Cardiovascular: Normal rate, regular rhythm and normal heart sounds.  Occasional extrasystoles are present.  Pulmonary/Chest: Effort normal and breath sounds normal.  Lymphadenopathy:    He has no cervical adenopathy.  Neurological: He is alert and oriented to person, place, and time.  Skin: Skin is warm, dry and intact. He is not diaphoretic.  Psychiatric: He has a normal mood and affect. His speech is normal and behavior is normal.    Lab Results  Component Value Date   WBC 6.3 07/30/2018   HGB 13.7 07/30/2018   HCT 42.4 07/30/2018   PLT 202 07/30/2018   GLUCOSE 81 07/30/2018   CHOL 226 (H) 07/06/2018   TRIG 190.0 (H) 07/06/2018   HDL 68.40 07/06/2018   LDLCALC 119 (H) 07/06/2018   ALT 16 07/06/2018   AST 16 07/06/2018   NA 139 07/30/2018   K 3.4 (L) 07/30/2018   CL 99 07/30/2018   CREATININE 1.49 (H) 07/30/2018   BUN 17 07/30/2018   CO2 31 07/30/2018   TSH 4.32 07/06/2018   PSA 2.52 07/06/2018   HGBA1C 6.1 07/06/2018   MICROALBUR <0.7 07/06/2018    Dg Chest 2 View  Result Date: 07/27/2018 CLINICAL DATA:  Initial evaluation for acute chest pain, shortness of breath. EXAM: CHEST - 2 VIEW COMPARISON:  Prior radiograph from 04/13/2016. FINDINGS: The cardiac and mediastinal silhouettes are stable in size and contour, and remain within normal limits. The lungs are normally inflated. Mild right perihilar atelectasis. No airspace consolidation, pleural effusion, or pulmonary edema is  identified. There is no pneumothorax. No acute osseous abnormality identified. IMPRESSION: 1. Mild right perihilar atelectasis. 2. No other active cardiopulmonary disease. Electronically Signed   By: Rise Mu M.D.   On: 07/27/2018 16:31    Assessment & Plan:   Antonis was seen today for hospitalization follow-up.  Diagnoses and all orders for this visit:  Essential hypertension -     carvedilol (COREG) 3.125 MG tablet; Take 1 tablet (3.125 mg total) by mouth 2 (two) times daily. -     furosemide (LASIX) 20 MG tablet; Take 1 tablet (20 mg total) by mouth daily. -     amLODipine (NORVASC) 5 MG tablet; Take 1 tablet (5 mg total) by mouth daily. -     Basic metabolic panel  PAF (paroxysmal atrial fibrillation) (HCC) -     dabigatran (PRADAXA)  150 MG CAPS capsule; Take 1 capsule (150 mg total) by mouth 2 (two) times daily.  Depression with anxiety -     busPIRone (BUSPAR) 15 MG tablet; Take 1 tablet (15 mg total) by mouth 2 (two) times daily. -     Fluoxetine HCl, PMDD, 20 MG CAPS; Take 3 capsules (60 mg total) by mouth daily.  Chest pain, unspecified type -     nitroGLYCERIN (NITROSTAT) 0.4 MG SL tablet; Place 1 tablet (0.4 mg total) under the tongue every 5 (five) minutes as needed for chest pain.  Controlled type 2 diabetes mellitus without complication, without long-term current use of insulin (HCC) -     metFORMIN (GLUCOPHAGE) 500 MG tablet; Take 1 tablet (500 mg total) by mouth 2 (two) times daily with a meal. -     Basic metabolic panel  Mild renal insufficiency -     Basic metabolic panel  Gastroesophageal reflux disease without esophagitis -     Discontinue: famotidine (PEPCID) 20 MG tablet; Take 1 tablet (20 mg total) by mouth 2 (two) times daily. -     famotidine (PEPCID) 20 MG tablet; Take 1 tablet (20 mg total) by mouth daily.   I have discontinued Khizar Garrels's FLUoxetine HCl. I have also changed his nitroGLYCERIN and famotidine. Additionally, I am having him  start on Fluoxetine HCl (PMDD), dabigatran, amLODipine, and metFORMIN. Lastly, I am having him maintain his albuterol, aspirin EC, polyethylene glycol powder, nicotine, zolpidem, ipratropium-albuterol, albuterol, busPIRone, carvedilol, and furosemide.  Meds ordered this encounter  Medications  . busPIRone (BUSPAR) 15 MG tablet    Sig: Take 1 tablet (15 mg total) by mouth 2 (two) times daily.    Dispense:  180 tablet    Refill:  1  . carvedilol (COREG) 3.125 MG tablet    Sig: Take 1 tablet (3.125 mg total) by mouth 2 (two) times daily.    Dispense:  180 tablet    Refill:  3  . Fluoxetine HCl, PMDD, 20 MG CAPS    Sig: Take 3 capsules (60 mg total) by mouth daily.    Dispense:  270 each    Refill:  1  . furosemide (LASIX) 20 MG tablet    Sig: Take 1 tablet (20 mg total) by mouth daily.    Dispense:  90 tablet    Refill:  3  . nitroGLYCERIN (NITROSTAT) 0.4 MG SL tablet    Sig: Place 1 tablet (0.4 mg total) under the tongue every 5 (five) minutes as needed for chest pain.    Dispense:  25 tablet    Refill:  5  . dabigatran (PRADAXA) 150 MG CAPS capsule    Sig: Take 1 capsule (150 mg total) by mouth 2 (two) times daily.    Dispense:  180 capsule    Refill:  1  . DISCONTD: famotidine (PEPCID) 20 MG tablet    Sig: Take 1 tablet (20 mg total) by mouth 2 (two) times daily.    Dispense:  180 tablet    Refill:  1  . amLODipine (NORVASC) 5 MG tablet    Sig: Take 1 tablet (5 mg total) by mouth daily.    Dispense:  90 tablet    Refill:  3  . metFORMIN (GLUCOPHAGE) 500 MG tablet    Sig: Take 1 tablet (500 mg total) by mouth 2 (two) times daily with a meal.    Dispense:  180 tablet    Refill:  3  . famotidine (PEPCID) 20 MG tablet  Sig: Take 1 tablet (20 mg total) by mouth daily.    Dispense:  90 tablet    Refill:  2   Patient will follow-up with cardiology this coming week for his congestive heart failure issues.  He will see me back in 3 months.  Pradaxa was restarted today.  Patient  was given information on coping with quitting smoking.  I restarted his metformin because it is controlled his diabetes well in his current renal function is more than adequate to support the use of metformin. Follow-up: No follow-ups on file.  Mliss Sax, MD

## 2018-08-10 NOTE — Telephone Encounter (Signed)
Pt called stated he needs temporary supply for Dabigatran send to [facility]. Please help, he is out of this med and mail order will not arrive until 2 wks.

## 2018-08-10 NOTE — Telephone Encounter (Signed)
Rx sent in for 30 day supply.

## 2018-08-11 ENCOUNTER — Encounter (HOSPITAL_COMMUNITY): Payer: Self-pay

## 2018-08-11 ENCOUNTER — Emergency Department (HOSPITAL_COMMUNITY): Payer: Medicare Other

## 2018-08-11 ENCOUNTER — Telehealth: Payer: Self-pay | Admitting: Family Medicine

## 2018-08-11 ENCOUNTER — Emergency Department (HOSPITAL_COMMUNITY)
Admission: EM | Admit: 2018-08-11 | Discharge: 2018-08-11 | Disposition: A | Discharge status: Home / Self Care | Payer: Medicare Other | Attending: Emergency Medicine | Admitting: Emergency Medicine

## 2018-08-11 DIAGNOSIS — I4891 Unspecified atrial fibrillation: Secondary | ICD-10-CM | POA: Diagnosis not present

## 2018-08-11 DIAGNOSIS — F17210 Nicotine dependence, cigarettes, uncomplicated: Secondary | ICD-10-CM | POA: Diagnosis not present

## 2018-08-11 DIAGNOSIS — Z7984 Long term (current) use of oral hypoglycemic drugs: Secondary | ICD-10-CM | POA: Diagnosis not present

## 2018-08-11 DIAGNOSIS — I5032 Chronic diastolic (congestive) heart failure: Secondary | ICD-10-CM | POA: Diagnosis not present

## 2018-08-11 DIAGNOSIS — Z7982 Long term (current) use of aspirin: Secondary | ICD-10-CM | POA: Diagnosis not present

## 2018-08-11 DIAGNOSIS — R000 Tachycardia, unspecified: Secondary | ICD-10-CM | POA: Diagnosis present

## 2018-08-11 DIAGNOSIS — I110 Hypertensive heart disease with heart failure: Secondary | ICD-10-CM | POA: Diagnosis not present

## 2018-08-11 DIAGNOSIS — Z79899 Other long term (current) drug therapy: Secondary | ICD-10-CM | POA: Diagnosis not present

## 2018-08-11 DIAGNOSIS — J45909 Unspecified asthma, uncomplicated: Secondary | ICD-10-CM | POA: Diagnosis not present

## 2018-08-11 DIAGNOSIS — Z9114 Patient's other noncompliance with medication regimen: Secondary | ICD-10-CM | POA: Diagnosis not present

## 2018-08-11 DIAGNOSIS — E119 Type 2 diabetes mellitus without complications: Secondary | ICD-10-CM | POA: Diagnosis not present

## 2018-08-11 HISTORY — DX: Unspecified atrial fibrillation: I48.91

## 2018-08-11 LAB — CBC
HEMATOCRIT: 45.2 % (ref 39.0–52.0)
Hemoglobin: 13.9 g/dL (ref 13.0–17.0)
MCH: 28.8 pg (ref 26.0–34.0)
MCHC: 30.8 g/dL (ref 30.0–36.0)
MCV: 93.6 fL (ref 80.0–100.0)
NRBC: 0 % (ref 0.0–0.2)
PLATELETS: 244 10*3/uL (ref 150–400)
RBC: 4.83 MIL/uL (ref 4.22–5.81)
RDW: 13.5 % (ref 11.5–15.5)
WBC: 5.8 10*3/uL (ref 4.0–10.5)

## 2018-08-11 LAB — BASIC METABOLIC PANEL
Anion gap: 10 (ref 5–15)
BUN: 8 mg/dL (ref 6–20)
CALCIUM: 9.1 mg/dL (ref 8.9–10.3)
CO2: 27 mmol/L (ref 22–32)
CREATININE: 1.26 mg/dL — AB (ref 0.61–1.24)
Chloride: 100 mmol/L (ref 98–111)
GFR calc Af Amer: >60 mL/min (ref >60)
GLUCOSE: 93 mg/dL (ref 70–99)
Potassium: 3.4 mmol/L — ABNORMAL LOW (ref 3.5–5.1)
SODIUM: 137 mmol/L (ref 135–145)

## 2018-08-11 LAB — TROPONIN I

## 2018-08-11 MED ORDER — METOPROLOL TARTRATE 25 MG PO TABS
25.0000 mg | ORAL_TABLET | Freq: Once | ORAL | Status: DC
  Filled 2018-08-11: qty 1

## 2018-08-11 MED ORDER — HEPARIN (PORCINE) 25000 UT/250ML-% IV SOLN
1200.0000 [IU]/h | INTRAVENOUS | Status: DC
  Administered 2018-08-11: 1200 [IU]/h via INTRAVENOUS
  Filled 2018-08-11: qty 250

## 2018-08-11 MED ORDER — DABIGATRAN ETEXILATE MESYLATE 150 MG PO CAPS
150.0000 mg | ORAL_CAPSULE | ORAL | Status: AC
  Administered 2018-08-11: 150 mg via ORAL
  Filled 2018-08-11: qty 1

## 2018-08-11 MED ORDER — HEPARIN BOLUS VIA INFUSION
4000.0000 [IU] | Freq: Once | INTRAVENOUS | Status: AC
  Administered 2018-08-11: 4000 [IU] via INTRAVENOUS
  Filled 2018-08-11: qty 4000

## 2018-08-11 NOTE — Progress Notes (Signed)
ANTICOAGULATION CONSULT NOTE - Initial Consult  Pharmacy Consult for heparin Indication: atrial fibrillation  Allergies  Allergen Reactions  . Lisinopril Swelling  . Tomato Rash    Patient Measurements: Height: 6' (182.9 cm) Weight: 175 lb 7.8 oz (79.6 kg) IBW/kg (Calculated) : 77.6  Vital Signs: Temp: 97.9 F (36.6 C) (11/20 0149) Temp Source: Oral (11/20 0149) BP: 147/103 (11/20 0200) Pulse Rate: 106 (11/20 0200)  Labs: Recent Labs    08/10/18 1006  CREATININE 1.22    Estimated Creatinine Clearance: 73.3 mL/min (by C-G formula based on SCr of 1.22 mg/dL).   Medical History: Past Medical History:  Diagnosis Date  . Arthritis   . Asthma   . Atrial fibrillation (HCC)   . Depression   . Diabetes mellitus without complication (HCC)   . Emphysema of lung (HCC)   . Heart murmur   . Hyperlipidemia   . Hypertension     Assessment: 57yo male awoke from sleep w/ feeling of flutter in chest, found to be in Afib w/ multiple PVC; had outpt visit yesterday where Pradaxa was to be resumed but pt states he has not yet picked up Rx, to start heparin.  Goal of Therapy:  Heparin level 0.3-0.7 units/ml Monitor platelets by anticoagulation protocol: Yes   Plan:  Will give heparin 4000 units IV bolus x1 followed by gtt at 1200 units/hr and monitor heparin levels and CBC.  Vernard Gambles, PharmD, BCPS  08/11/2018,2:30 AM

## 2018-08-11 NOTE — Discharge Instructions (Addendum)
Call the atrial fibrillation clinic at [phone] today.  Tell them that you were seen in the emergency department for atrial fibrillation and need follow-up.  Return to the ER for any recurrent episodes.  You received your morning dose of Pradaxa.  Make sure you fill the prescription today and continue taking it as prescribed.  Take the rest of your medications as prescribed as well.

## 2018-08-11 NOTE — ED Provider Notes (Addendum)
[facility] EMERGENCY DEPARTMENT Provider Note   CSN: [id number] Arrival date & time: 08/11/18  0146     History   Chief Complaint Chief Complaint  Patient presents with  . Atrial Fibrillation    HPI Kenneth [name] is a 57 y.o. male.  Patient presents from home by ambulance for evaluation of rapid heart rate with fluttering in his chest.  Patient reports that he has a history of atrial fibrillation.  He recently relocated back to the area from [place].  He has been off of most of his medications in the transition.  He did see his local primary care doctor in the area here today, was given prescription but has not restarted his meds.  He is off of his Pradaxa for an unknown period of time.  He did, however, take a dose of amlodipine tonight after the fluttering started.  Patient denies chest pain and shortness of breath at this time.  He does, however, report that he walked to the store earlier today and became very short of breath and had some tightness and discomfort in his chest, had to stop along the way.     Past Medical History:  Diagnosis Date  . Arthritis   . Asthma   . Atrial fibrillation (HCC)   . Depression   . Diabetes mellitus without complication (HCC)   . Emphysema of lung (HCC)   . Heart murmur   . Hyperlipidemia   . Hypertension     Patient Active Problem List   Diagnosis Date Noted  . Gastroesophageal reflux disease without esophagitis 08/10/2018  . Tobacco abuse 08/10/2018  . Acute on chronic diastolic CHF (congestive heart failure) (HCC) 07/28/2018  . Chest pain 07/27/2018  . PAF (paroxysmal atrial fibrillation) (HCC) 07/27/2018  . Mild renal insufficiency 07/27/2018  . Chronic diastolic CHF (congestive heart failure) (HCC) 07/27/2018  . Slow transit constipation 07/20/2018  . Essential hypertension 07/06/2018  . Chronic obstructive pulmonary disease (HCC) 07/06/2018  . Controlled type 2 diabetes mellitus without  complication, without long-term current use of insulin (HCC) 07/06/2018  . Depression with anxiety 07/06/2018  . Anemia 07/06/2018  . Encounter for health maintenance examination with abnormal findings 07/06/2018  . Alcohol abuse 07/06/2018    History reviewed. No pertinent surgical history.      Home Medications    Prior to Admission medications   Medication Sig Start Date End Date Taking? Authorizing Provider  albuterol (PROVENTIL HFA;VENTOLIN HFA) 108 (90 Base) MCG/ACT inhaler Inhale 1 puff into the lungs as needed for wheezing.  06/19/16  Yes [provider]  albuterol (PROVENTIL) (2.5 MG/3ML) 0.083% nebulizer solution Take 3 mLs (2.5 mg total) by nebulization 5 (five) times daily as needed for wheezing or shortness of breath. 08/04/18  Yes Mliss Sax, MD  amLODipine (NORVASC) 5 MG tablet Take 1 tablet (5 mg total) by mouth daily. 08/10/18  Yes Mliss Sax, MD  aspirin EC 81 MG tablet Take 1 tablet by mouth daily. 06/19/16  Yes [provider]  busPIRone (BUSPAR) 15 MG tablet Take 1 tablet (15 mg total) by mouth 2 (two) times daily. 08/10/18  Yes Mliss Sax, MD  carvedilol (COREG) 3.125 MG tablet Take 1 tablet (3.125 mg total) by mouth 2 (two) times daily. 08/10/18  Yes Mliss Sax, MD  famotidine (PEPCID) 20 MG tablet Take 1 tablet (20 mg total) by mouth daily. 08/10/18  Yes Mliss Sax, MD  Fluoxetine HCl, PMDD, 20 MG CAPS Take 3  capsules (60 mg total) by mouth daily. 08/10/18 11/08/18 Yes Mliss Sax, MD  furosemide (LASIX) 20 MG tablet Take 1 tablet (20 mg total) by mouth daily. 08/10/18  Yes Mliss Sax, MD  ipratropium-albuterol (DUONEB) 0.5-2.5 (3) MG/3ML SOLN Take 3 mLs by nebulization 2 (two) times daily as needed. 08/04/18  Yes Mliss Sax, MD  metFORMIN (GLUCOPHAGE) 500 MG tablet Take 1 tablet (500 mg total) by mouth 2 (two) times daily with a meal. 08/10/18  Yes Mliss Sax, MD  nitroGLYCERIN (NITROSTAT) 0.4 MG SL tablet Place 1 tablet (0.4 mg total) under the tongue every 5 (five) minutes as needed for chest pain. 08/10/18  Yes Mliss Sax, MD  polyethylene glycol powder [facility]) powder Take 17 g by mouth 2 (two) times daily as needed. 07/20/18  Yes Mliss Sax, MD  zolpidem (AMBIEN CR) 12.5 MG CR tablet Take 1 tablet (12.5 mg total) by mouth at bedtime as needed for sleep. 07/30/18 07/30/19 Yes Darlin Drop, DO  dabigatran (PRADAXA) 150 MG CAPS capsule Take 1 capsule (150 mg total) by mouth 2 (two) times daily. 08/10/18   Mliss Sax, MD  nicotine (NICODERM CQ - DOSED IN MG/24 HR) 7 mg/24hr patch Place 1 patch (7 mg total) onto the skin daily. Patient not taking: Reported on 08/11/2018 07/31/18   Darlin Drop, DO    Family History Family History  Problem Relation Age of Onset  . Diabetes Sister   . Coronary artery disease Brother     Social History Social History   Tobacco Use  . Smoking status: Current Every Day Smoker    Packs/day: 0.50    Types: Cigarettes  . Smokeless tobacco: Never Used  Substance Use Topics  . Alcohol use: Yes    Comment: Drinks up to six beers around a game  . Drug use: Yes    Types: Marijuana    Comment: occ marijuana     Allergies   Lisinopril and Tomato   Review of Systems Review of Systems  Respiratory: Positive for chest tightness and shortness of breath.   Cardiovascular: Positive for chest pain and palpitations.  All other systems reviewed and are negative.    Physical Exam Updated Vital Signs BP (!) 142/68   Pulse 66   Temp 97.9 F (36.6 C) (Oral)   Resp (!) 23   Ht 6' (1.829 m)   Wt 79.6 kg   SpO2 91%   BMI 23.80 kg/m   Physical Exam  Constitutional: He is oriented to person, place, and time. He appears well-developed and well-nourished. No distress.  HENT:  Head: Normocephalic and atraumatic.  Right Ear: Hearing normal.  Left Ear:  Hearing normal.  Nose: Nose normal.  Mouth/Throat: Oropharynx is clear and moist and mucous membranes are normal.  Eyes: Pupils are equal, round, and reactive to light. Conjunctivae and EOM are normal.  Neck: Normal range of motion. Neck supple.  Cardiovascular: S1 normal and S2 normal. An irregularly irregular rhythm present. Tachycardia present. Exam reveals no gallop and no friction rub.  No murmur heard. Pulmonary/Chest: Effort normal and breath sounds normal. No respiratory distress. He exhibits no tenderness.  Abdominal: Soft. Normal appearance and bowel sounds are normal. There is no hepatosplenomegaly. There is no tenderness. There is no rebound, no guarding, no tenderness at McBurney's point and negative Murphy's sign. No hernia.  Musculoskeletal: Normal range of motion.  Neurological: He is alert and oriented to person, place, and time. He has normal  strength. No cranial nerve deficit or sensory deficit. Coordination normal. GCS eye subscore is 4. GCS verbal subscore is 5. GCS motor subscore is 6.  Skin: Skin is warm, dry and intact. No rash noted. No cyanosis.  Psychiatric: He has a normal mood and affect. His speech is normal and behavior is normal. Thought content normal.  Nursing note and vitals reviewed.    ED Treatments / Results  Labs (all labs ordered are listed, but only abnormal results are displayed) Labs Reviewed  CBC  HEPARIN LEVEL (UNFRACTIONATED)  BASIC METABOLIC PANEL  TROPONIN I    EKG EKG Interpretation  Date/Time:  Wednesday August 11 2018 01:47:44 EST Ventricular Rate:  126 PR Interval:    QRS Duration: 162 QT Interval:  341 QTC Calculation: 494 R Axis:   77 Text Interpretation:  Atrial fibrillation LVH with secondary repolarization abnormality Anterior Q waves, possibly due to LVH Confirmed by Gilda Crease [phone]) on 08/11/2018 3:01:48 AM   Radiology Dg Chest Port 1 View  Result Date: 08/11/2018 CLINICAL DATA:  57 y/o M; chest  pain and shortness of breath. History of COPD. EXAM: PORTABLE CHEST 2 VIEW COMPARISON:  07/29/2018 chest radiograph. FINDINGS: Stable cardiac silhouette within normal limits given projection and technique. Aortic atherosclerosis with calcification. Interval resolution of right lung base opacity. No new consolidation identified. Stable lung hyperinflation. No pleural effusion or pneumothorax. No acute osseous abnormality is evident. IMPRESSION: Interval resolution of right lung base opacity. No new consolidation identified. Stable hyperinflated lungs compatible with COPD. Electronically Signed   By: Mitzi Hansen M.D.   On: 08/11/2018 03:20    Procedures .Critical Care Performed by: Gilda Crease, MD Authorized by: Gilda Crease, MD   Critical care provider statement:    Critical care time (minutes):  35   Critical care time was exclusive of:  Separately billable procedures and treating other patients   Critical care was necessary to treat or prevent imminent or life-threatening deterioration of the following conditions: atrial fibrillation with rapid ventricular response.   Critical care was time spent personally by me on the following activities:  Ordering and performing treatments and interventions, ordering and review of laboratory studies, development of treatment plan with patient or surrogate, discussions with consultants, ordering and review of radiographic studies, pulse oximetry, evaluation of patient's response to treatment, re-evaluation of patient's condition, review of old charts and examination of patient   (including critical care time)  Medications Ordered in ED Medications  heparin ADULT infusion 100 units/mL (25000 units/26mL sodium chloride 0.45%) (1,200 Units/hr Intravenous New Bag/Given 08/11/18 0334)  heparin bolus via infusion 4,000 Units (4,000 Units Intravenous Bolus from Bag 08/11/18 0335)     Initial Impression / Assessment and Plan / ED  Course  I have reviewed the triage vital signs and the nursing notes.  Pertinent labs & imaging results that were available during my care of the patient were reviewed by me and considered in my medical decision making (see chart for details).    Patient presents to the emergency department for evaluation of heart palpitations with chest pain.  Patient has history of paroxysmal atrial fibrillation.  He was previously on Pradaxa, but currently is not on any anticoagulation.  At arrival to the ER he is in atrial fibrillation with slightly rapid ventricular rate.  Heart rate ranging from the 90s to the 1 teens.  He is at least partially rate controlled by his Coreg.  It does seem that this atrial fibrillation episode began today,  however, he is not anticoagulated and it is unclear if he has been cycling in and out of atrial fibrillation.  He is therefore not a candidate for cardioversion.  Patient started on IV heparin.  Patient spontaneously converted to sinus rhythm while awaiting return of his labs.  He continues to do well.  Lab work is unremarkable.  Discussed with Dr. Deforest Hoyles, on-call for cardiology.  He felt that the patient could safely be discharged.  Discontinue heparin, initiate the Pradaxa that he has been on previously and will follow-up in atrial fibrillation clinic.  CHA2DS2/VAS Stroke Risk Points  Current as of 28 minutes ago     3 >= 2 Points: High Risk  1 - 1.99 Points: Medium Risk  0 Points: Low Risk    The patient's score has not changed in the past year.:  No Change     Details    This score determines the patient's risk of having a stroke if the  patient has atrial fibrillation.       Points Metrics  1 Has Congestive Heart Failure:  Yes    Current as of 28 minutes ago  0 Has Vascular Disease:  No    Current as of 28 minutes ago  1 Has Hypertension:  Yes    Current as of 28 minutes ago  0 Age:  88    Current as of 28 minutes ago  1 Has Diabetes:  Yes    Current as of  28 minutes ago  0 Had Stroke:  No  Had TIA:  No  Had thromboembolism:  No    Current as of 28 minutes ago  0 Male:  No    Current as of 28 minutes ago            Final Clinical Impressions(s) / ED Diagnoses   Final diagnoses:  Atrial fibrillation with [facility])    ED Discharge Orders    None       Gilda Crease, MD 08/11/18 [id number]    Gilda Crease, MD 08/11/18 0542    Gilda Crease, MD 08/22/18 [phone]

## 2018-08-11 NOTE — Telephone Encounter (Signed)
See request of PCP

## 2018-08-11 NOTE — Telephone Encounter (Signed)
Copied from CRM [id number]. Topic: Quick Communication - See Telephone Encounter >> Aug 11, 2018 10:00 AM Kenneth [name] wrote: Pt is wanting to talk with someone in regards to getting samples or coupons for pradaxa-he states that the rx was sent to mail order is going to take about two weeks and he is hoping to get enough to get him thru til then   Best number [phone]

## 2018-08-11 NOTE — ED Triage Notes (Signed)
Pt woke up tonight around 11pm feeling like a flutter in his chest, hx of afib, in afib with multiple PVC, and SOB. Denies other cardiac symptoms.

## 2018-08-11 NOTE — Telephone Encounter (Signed)
I left patient a voicemail letting him know to give the pharmacy a call to see if they can contact his insurance for an override to get him through until the mail order comes in. I advised patient we do not have any samples or coupons.

## 2018-08-12 ENCOUNTER — Ambulatory Visit (HOSPITAL_COMMUNITY)
Admission: RE | Admit: 2018-08-12 | Discharge: 2018-08-12 | Disposition: A | Discharge status: Home / Self Care | Payer: Medicare Other | Source: Ambulatory Visit | Attending: Nurse Practitioner | Admitting: Nurse Practitioner

## 2018-08-12 ENCOUNTER — Encounter (HOSPITAL_COMMUNITY): Payer: Self-pay | Admitting: Nurse Practitioner

## 2018-08-12 VITALS — BP 178/98 | HR 71 | Ht 72.0 in | Wt 170.0 lb

## 2018-08-12 DIAGNOSIS — X58XXXA Exposure to other specified factors, initial encounter: Secondary | ICD-10-CM | POA: Insufficient documentation

## 2018-08-12 DIAGNOSIS — E119 Type 2 diabetes mellitus without complications: Secondary | ICD-10-CM | POA: Insufficient documentation

## 2018-08-12 DIAGNOSIS — Z7901 Long term (current) use of anticoagulants: Secondary | ICD-10-CM | POA: Insufficient documentation

## 2018-08-12 DIAGNOSIS — J439 Emphysema, unspecified: Secondary | ICD-10-CM | POA: Diagnosis not present

## 2018-08-12 DIAGNOSIS — F329 Major depressive disorder, single episode, unspecified: Secondary | ICD-10-CM | POA: Insufficient documentation

## 2018-08-12 DIAGNOSIS — M1990 Unspecified osteoarthritis, unspecified site: Secondary | ICD-10-CM | POA: Diagnosis not present

## 2018-08-12 DIAGNOSIS — I480 Paroxysmal atrial fibrillation: Secondary | ICD-10-CM | POA: Diagnosis present

## 2018-08-12 DIAGNOSIS — R011 Cardiac murmur, unspecified: Secondary | ICD-10-CM | POA: Diagnosis not present

## 2018-08-12 DIAGNOSIS — Z7984 Long term (current) use of oral hypoglycemic drugs: Secondary | ICD-10-CM | POA: Insufficient documentation

## 2018-08-12 DIAGNOSIS — R9431 Abnormal electrocardiogram [ECG] [EKG]: Secondary | ICD-10-CM | POA: Diagnosis not present

## 2018-08-12 DIAGNOSIS — I252 Old myocardial infarction: Secondary | ICD-10-CM | POA: Diagnosis not present

## 2018-08-12 DIAGNOSIS — T783XXA Angioneurotic edema, initial encounter: Secondary | ICD-10-CM | POA: Diagnosis not present

## 2018-08-12 DIAGNOSIS — F17210 Nicotine dependence, cigarettes, uncomplicated: Secondary | ICD-10-CM | POA: Diagnosis not present

## 2018-08-12 DIAGNOSIS — Z8249 Family history of ischemic heart disease and other diseases of the circulatory system: Secondary | ICD-10-CM | POA: Insufficient documentation

## 2018-08-12 DIAGNOSIS — Z79899 Other long term (current) drug therapy: Secondary | ICD-10-CM | POA: Diagnosis not present

## 2018-08-12 DIAGNOSIS — E785 Hyperlipidemia, unspecified: Secondary | ICD-10-CM | POA: Diagnosis not present

## 2018-08-12 DIAGNOSIS — I10 Essential (primary) hypertension: Secondary | ICD-10-CM | POA: Diagnosis not present

## 2018-08-12 DIAGNOSIS — J45909 Unspecified asthma, uncomplicated: Secondary | ICD-10-CM | POA: Diagnosis not present

## 2018-08-12 MED ORDER — CARVEDILOL 3.125 MG PO TABS: 6.2500 mg | ORAL_TABLET | Freq: Two times a day (BID) | ORAL | 3 refills | Status: DC

## 2018-08-12 MED ORDER — APIXABAN 5 MG PO TABS: 5.0000 mg | ORAL_TABLET | Freq: Two times a day (BID) | ORAL | 0 refills | Status: DC

## 2018-08-12 NOTE — Patient Instructions (Signed)
Stop aspirin and pradaxa  Start Eliquis 5mg  twice a day  Increase coreg to 6.25mg  twice a day

## 2018-08-13 ENCOUNTER — Telehealth: Payer: Self-pay

## 2018-08-13 NOTE — Telephone Encounter (Signed)
Copied from CRM [id number]. Topic: General - Other >> Aug 13, 2018  7:59 AM Gaynelle Adu wrote: Reason for CRM: patient is calling to inform that the clinic has changed his medication from pradaxa to Eliquis. Please advise   fyi

## 2018-08-13 NOTE — Telephone Encounter (Signed)
Thanks, please note in chart.

## 2018-08-13 NOTE — Progress Notes (Signed)
Primary Care Physician: Mliss Sax, MD Referring Physician: [facility] ER f/u   Kenneth [name] is a 57 y.o. male with a h/o tobacco abuse, PAF, HTN, DN, non ischemic cardiomyopathy with EF 35-40%( care everywhere)  that is in the afib  clinic as f/u from a recent ER visit. He  presented in afib, which he has had for years and self converted. He did live in the [facility] area and was going to a cardiologist in [place] at one point. He moved to [place] for a while and recently moved back to the area. He had been off many of his meds in the transition. He states that he was on hydralazine/imdur for a while but the [facility] MD stopped this for unclear reasons(angioedema with ACE int he past). Pt  also said that pradaxa was stopped, again for unclear reasons. He is in SR today.He denies alcohol use, no snoring/apnea history, trying to reduce smoking. BP very elevated today. He states that it usually is elevated.   Today, he denies symptoms of palpitations, chest pain, shortness of breath, orthopnea, PND, lower extremity edema, dizziness, presyncope, syncope, or neurologic sequela. The patient is tolerating medications without difficulties and is otherwise without complaint today.   Past Medical History:  Diagnosis Date  . Arthritis   . Asthma   . Atrial fibrillation (HCC)   . Depression   . Diabetes mellitus without complication (HCC)   . Emphysema of lung (HCC)   . Heart murmur   . Hyperlipidemia   . Hypertension    No past surgical history on file.  Current Outpatient Medications  Medication Sig Dispense Refill  . albuterol (PROVENTIL HFA;VENTOLIN HFA) 108 (90 Base) MCG/ACT inhaler Inhale 1 puff into the lungs as needed for wheezing.     [name] albuterol (PROVENTIL) (2.5 MG/3ML) 0.083% nebulizer solution Take 3 mLs (2.5 mg total) by nebulization 5 (five) times daily as needed for wheezing or shortness of breath. 150 mL 3  . amLODipine (NORVASC) 5 MG tablet Take 1 tablet (5 mg total) by mouth  daily. 90 tablet 3  . busPIRone (BUSPAR) 15 MG tablet Take 1 tablet (15 mg total) by mouth 2 (two) times daily. 180 tablet 1  . carvedilol (COREG) 3.125 MG tablet Take 2 tablets (6.25 mg total) by mouth 2 (two) times daily. 180 tablet 3  . famotidine (PEPCID) 20 MG tablet Take 1 tablet (20 mg total) by mouth daily. 90 tablet 2  . Fluoxetine HCl, PMDD, 20 MG CAPS Take 3 capsules (60 mg total) by mouth daily. 270 each 1  . furosemide (LASIX) 20 MG tablet Take 1 tablet (20 mg total) by mouth daily. 90 tablet 3  . metFORMIN (GLUCOPHAGE) 500 MG tablet Take 1 tablet (500 mg total) by mouth 2 (two) times daily with a meal. 180 tablet 3  . polyethylene glycol powder (GLYCOLAX/MIRALAX) powder Take 17 g by mouth 2 (two) times daily as needed. 3350 g 1  . zolpidem (AMBIEN CR) 12.5 MG CR tablet Take 1 tablet (12.5 mg total) by mouth at bedtime as needed for sleep. 15 tablet 0  . apixaban (ELIQUIS) 5 MG TABS tablet Take 1 tablet (5 mg total) by mouth 2 (two) times daily. 60 tablet 0  . nicotine (NICODERM CQ - DOSED IN MG/24 HR) 7 mg/24hr patch Place 1 patch (7 mg total) onto the skin daily. (Patient not taking: Reported on 08/11/2018) 28 patch 0  . nitroGLYCERIN (NITROSTAT) 0.4 MG SL tablet Place 1 tablet (0.4 mg  total) under the tongue every 5 (five) minutes as needed for chest pain. (Patient not taking: Reported on 08/12/2018) 25 tablet 5   No current facility-administered medications for this encounter.     Allergies  Allergen Reactions  . Lisinopril Swelling  . Tomato Rash    Social History   Socioeconomic History  . Marital status: Legally Separated    Spouse name: Not on file  . Number of children: Not on file  . Years of education: Not on file  . Highest education level: Not on file  Occupational History  . Not on file  Social Needs  . Financial resource strain: Not on file  . Food insecurity:    Worry: Not on file    Inability: Not on file  . Transportation needs:    Medical: Not on  file    Non-medical: Not on file  Tobacco Use  . Smoking status: Current Every Day Smoker    Packs/day: 0.50    Types: Cigarettes  . Smokeless tobacco: Never Used  Substance and Sexual Activity  . Alcohol use: Yes    Comment: Drinks up to six beers around a game  . Drug use: Yes    Types: Marijuana    Comment: occ marijuana  . Sexual activity: Yes    Partners: Female  Lifestyle  . Physical activity:    Days per week: Not on file    Minutes per session: Not on file  . Stress: Not on file  Relationships  . Social connections:    Talks on phone: Not on file    Gets together: Not on file    Attends religious service: Not on file    Active member of club or organization: Not on file    Attends meetings of clubs or organizations: Not on file    Relationship status: Not on file  . Intimate partner violence:    Fear of current or ex partner: Not on file    Emotionally abused: Not on file    Physically abused: Not on file    Forced sexual activity: Not on file  Other Topics Concern  . Not on file  Social History Narrative  . Not on file    Family History  Problem Relation Age of Onset  . Diabetes Sister   . Coronary artery disease Brother     ROS- All systems are reviewed and negative except as per the HPI above  Physical Exam: Vitals:   08/12/18 1451  BP: (!) 178/98  Pulse: 71  Weight: 77.1 kg  Height: 6' (1.829 m)   Wt Readings from Last 3 Encounters:  08/12/18 77.1 kg  08/11/18 79.6 kg  08/10/18 79.6 kg    Labs: Lab Results  Component Value Date   NA 137 08/11/2018   K 3.4 (L) 08/11/2018   CL 100 08/11/2018   CO2 27 08/11/2018   GLUCOSE 93 08/11/2018   BUN 8 08/11/2018   CREATININE 1.26 (H) 08/11/2018   CALCIUM 9.1 08/11/2018   No results found for: INR Lab Results  Component Value Date   CHOL 226 (H) 07/06/2018   HDL 68.40 07/06/2018   LDLCALC 119 (H) 07/06/2018   TRIG 190.0 (H) 07/06/2018     GEN- The patient is well appearing, alert and  oriented x 3 today.   Head- normocephalic, atraumatic Eyes-  Sclera clear, conjunctiva pink Ears- hearing intact Oropharynx- clear Neck- supple, no JVP Lymph- no cervical lymphadenopathy Lungs- Clear to ausculation bilaterally, normal work of breathing  Heart- Regular rate and rhythm, no murmurs, rubs or gallops, PMI not laterally displaced GI- soft, NT, ND, + BS Extremities- no clubbing, cyanosis, or edema MS- no significant deformity or atrophy Skin- no rash or lesion Psych- euthymic mood, full affect Neuro- strength and sensation are intact  EKG-NSR at 71 bpm, Pr int 196 ms, qrs int 80 ms, qtc 449 ms Last echo 2016, I can not see full report in Epic, EF mentioned in a note   Assessment and Plan: 1. Paroxysmal afib H/o of this in SR today  Triggers discussed  Increase carvedilol to 6.125 mg bid for help in controlling afib, BP and LV dysfuction  2 LV dysfunction  Needs updated echo and referral to cardiology Coreg increased as above Angioedema with ACE Hydralazine/Imdur stopped in [place] ?   May ned to be restarted in the future Weight stable No unusual shortness of breath today    3. HTN Poorly controlled Avoid salt BB increased  4. CHA2DS2VASc score of at 3 Will rx eliquis 24m bid for better bleeding risk data that pradaxa 30 day coupon given Stop asa  Will see back in 10 days and will get updated echo Refer to cardiology  Lupita Leash C. [facility] [address] [place], [place] [address] [phone]

## 2018-08-17 ENCOUNTER — Telehealth: Payer: Self-pay | Admitting: Family Medicine

## 2018-08-17 MED ORDER — FLUOXETINE HCL 20 MG PO CAPS: 60.0000 mg | ORAL_CAPSULE | Freq: Every day | ORAL | 1 refills | Status: DC

## 2018-08-17 NOTE — Telephone Encounter (Signed)
Rx sent in

## 2018-08-17 NOTE — Telephone Encounter (Signed)
Please advise 

## 2018-08-17 NOTE — Telephone Encounter (Signed)
Pateint reports that he has been taking 3 20mg  capsules daily.  There is a 60mg  pill. We can substitute if insurance will cover that.

## 2018-08-17 NOTE — Telephone Encounter (Signed)
Copied from CRM [id number]. Topic: General - Other >> Aug 17, 2018  8:27 AM Tamela Oddi wrote: Reason for CRM: Cassandra from [employer] called to get medical clarification on the patient's medication, Fluoxetine HCl, PMDD, 20 MG CAPS.  They called to ask what the dosage should read.  Please advise.  CB# [phone]

## 2018-08-18 ENCOUNTER — Telehealth: Payer: Self-pay | Admitting: Family Medicine

## 2018-08-18 ENCOUNTER — Other Ambulatory Visit (HOSPITAL_COMMUNITY): Payer: Self-pay | Admitting: *Deleted

## 2018-08-18 ENCOUNTER — Ambulatory Visit (HOSPITAL_COMMUNITY)
Admission: RE | Admit: 2018-08-18 | Discharge: 2018-08-18 | Disposition: A | Discharge status: Home / Self Care | Payer: Medicare Other | Source: Ambulatory Visit | Attending: Nurse Practitioner | Admitting: Nurse Practitioner

## 2018-08-18 ENCOUNTER — Encounter (HOSPITAL_COMMUNITY): Payer: Self-pay | Admitting: Nurse Practitioner

## 2018-08-18 VITALS — BP 142/80 | HR 59 | Ht 72.0 in | Wt 171.0 lb

## 2018-08-18 DIAGNOSIS — I491 Atrial premature depolarization: Secondary | ICD-10-CM | POA: Diagnosis not present

## 2018-08-18 DIAGNOSIS — I10 Essential (primary) hypertension: Secondary | ICD-10-CM

## 2018-08-18 DIAGNOSIS — I4891 Unspecified atrial fibrillation: Secondary | ICD-10-CM | POA: Diagnosis not present

## 2018-08-18 DIAGNOSIS — I480 Paroxysmal atrial fibrillation: Secondary | ICD-10-CM

## 2018-08-18 DIAGNOSIS — I517 Cardiomegaly: Secondary | ICD-10-CM | POA: Insufficient documentation

## 2018-08-18 MED ORDER — APIXABAN 5 MG PO TABS: 5.0000 mg | ORAL_TABLET | Freq: Two times a day (BID) | ORAL | 6 refills | Status: DC

## 2018-08-18 MED ORDER — APIXABAN 5 MG PO TABS: 5.0000 mg | ORAL_TABLET | Freq: Two times a day (BID) | ORAL | 2 refills | Status: DC

## 2018-08-18 MED ORDER — CARVEDILOL 6.25 MG PO TABS: 6.2500 mg | ORAL_TABLET | Freq: Two times a day (BID) | ORAL | 3 refills | Status: DC

## 2018-08-18 NOTE — Telephone Encounter (Signed)
See note below. PA is required

## 2018-08-18 NOTE — Telephone Encounter (Signed)
PA started. 3 days for [employer] to respond. Will await response.

## 2018-08-18 NOTE — Telephone Encounter (Signed)
Cardiology will be prescribing.

## 2018-08-18 NOTE — Telephone Encounter (Signed)
Copied from CRM [id number]. Topic: Quick Communication - Rx Refill/Question >> Aug 18, 2018 10:28 AM Darletta Moll L wrote: Medication: apixaban (ELIQUIS) 5 MG TABS tablet (need PA for insurance to cover) patient would like a call once this is resolved  Has the patient contacted their pharmacy? Yes.   (Agent: If no, request that the patient contact the pharmacy for the refill.) (Agent: If yes, when and what did the pharmacy advise?)  Preferred Pharmacy (with phone number or street name): [facility] - [place], [place] - [address] [facility] [address] [place] [address] [place] [address] Phone: [phone] Fax: [phone]  Agent: Please be advised that RX refills may take up to 3 business days. We ask that you follow-up with your pharmacy.

## 2018-08-18 NOTE — Progress Notes (Signed)
Primary Care Physician: Mliss Sax, MD Referring Physician: [facility] ER f/u   Kenneth [name] is a 57 y.o. male with a h/o tobacco abuse, PAF, HTN, DN, non ischemic cardiomyopathy with EF 35-40%( care everywhere)  that is in the afib  clinic as f/u from a recent ER visit. He  presented in afib, which he has had for years and self converted. He did live in the [facility] area and was going to a cardiologist in [place] at one point. He moved to [place] for a while and recently moved back to the area. He had been off many of his meds in the transition. He states that he was on hydralazine/imdur for a while but the [facility] MD stopped this for unclear reasons(angioedema with ACE in the past). Pt  also said that pradaxa was stopped, again for unclear reasons. He is in SR today.He denies alcohol use, no snoring/apnea history, trying to reduce smoking. BP very elevated today. He states that it usually is elevated.  F/u in afib clinic,11/27. On last visit carvedilol was increased to 6.25 mg bid for elevated BP and further treatment of prior LV dysfunction. Echo scheduled and he is pending referral to general cardiology. Bp much improved. He reprts thqt he developed sharo chest pains Sunday PM, EMS advised to chew up 4 baby asa and by the time they arrived, the pain was gone and his V/S were normal. In care everywhere, a cardiology note states that he had a cardiac cath in July 2018 which revealed normal coronaries with an EF of 35%.  Today, he denies symptoms of palpitations, chest pain, shortness of breath, orthopnea, PND, lower extremity edema, dizziness, presyncope, syncope, or neurologic sequela. The patient is tolerating medications without difficulties and is otherwise without complaint today.   Past Medical History:  Diagnosis Date  . Arthritis   . Asthma   . Atrial fibrillation (HCC)   . Depression   . Diabetes mellitus without complication (HCC)   . Emphysema of lung (HCC)   . Heart murmur     . Hyperlipidemia   . Hypertension    No past surgical history on file.  Current Outpatient Medications  Medication Sig Dispense Refill  . albuterol (PROVENTIL HFA;VENTOLIN HFA) 108 (90 Base) MCG/ACT inhaler Inhale 1 puff into the lungs as needed for wheezing.     [name] albuterol (PROVENTIL) (2.5 MG/3ML) 0.083% nebulizer solution Take 3 mLs (2.5 mg total) by nebulization 5 (five) times daily as needed for wheezing or shortness of breath. 150 mL 3  . amLODipine (NORVASC) 5 MG tablet Take 1 tablet (5 mg total) by mouth daily. 90 tablet 3  . apixaban (ELIQUIS) 5 MG TABS tablet Take 1 tablet (5 mg total) by mouth 2 (two) times daily. 60 tablet 0  . busPIRone (BUSPAR) 15 MG tablet Take 1 tablet (15 mg total) by mouth 2 (two) times daily. 180 tablet 1  . carvedilol (COREG) 3.125 MG tablet Take 2 tablets (6.25 mg total) by mouth 2 (two) times daily. 180 tablet 3  . famotidine (PEPCID) 20 MG tablet Take 1 tablet (20 mg total) by mouth daily. 90 tablet 2  . FLUoxetine (PROZAC) 20 MG capsule Take 3 capsules (60 mg total) by mouth daily. 270 capsule 1  . furosemide (LASIX) 20 MG tablet Take 1 tablet (20 mg total) by mouth daily. 90 tablet 3  . metFORMIN (GLUCOPHAGE) 500 MG tablet Take 1 tablet (500 mg total) by mouth 2 (two) times daily with a meal.  180 tablet 3  . polyethylene glycol powder (GLYCOLAX/MIRALAX) powder Take 17 g by mouth 2 (two) times daily as needed. 3350 g 1  . zolpidem (AMBIEN CR) 12.5 MG CR tablet Take 1 tablet (12.5 mg total) by mouth at bedtime as needed for sleep. 15 tablet 0  . nicotine (NICODERM CQ - DOSED IN MG/24 HR) 7 mg/24hr patch Place 1 patch (7 mg total) onto the skin daily. (Patient not taking: Reported on 08/18/2018) 28 patch 0  . nitroGLYCERIN (NITROSTAT) 0.4 MG SL tablet Place 1 tablet (0.4 mg total) under the tongue every 5 (five) minutes as needed for chest pain. (Patient not taking: Reported on 08/18/2018) 25 tablet 5   No current facility-administered medications for  this encounter.     Allergies  Allergen Reactions  . Lisinopril Swelling  . Tomato Rash    Social History   Socioeconomic History  . Marital status: Legally Separated    Spouse name: Not on file  . Number of children: Not on file  . Years of education: Not on file  . Highest education level: Not on file  Occupational History  . Not on file  Social Needs  . Financial resource strain: Not on file  . Food insecurity:    Worry: Not on file    Inability: Not on file  . Transportation needs:    Medical: Not on file    Non-medical: Not on file  Tobacco Use  . Smoking status: Current Every Day Smoker    Packs/day: 0.50    Types: Cigarettes  . Smokeless tobacco: Never Used  Substance and Sexual Activity  . Alcohol use: Yes    Comment: Drinks up to six beers around a game  . Drug use: Yes    Types: Marijuana    Comment: occ marijuana  . Sexual activity: Yes    Partners: Female  Lifestyle  . Physical activity:    Days per week: Not on file    Minutes per session: Not on file  . Stress: Not on file  Relationships  . Social connections:    Talks on phone: Not on file    Gets together: Not on file    Attends religious service: Not on file    Active member of club or organization: Not on file    Attends meetings of clubs or organizations: Not on file    Relationship status: Not on file  . Intimate partner violence:    Fear of current or ex partner: Not on file    Emotionally abused: Not on file    Physically abused: Not on file    Forced sexual activity: Not on file  Other Topics Concern  . Not on file  Social History Narrative  . Not on file    Family History  Problem Relation Age of Onset  . Diabetes Sister   . Coronary artery disease Brother     ROS- All systems are reviewed and negative except as per the HPI above  Physical Exam: Vitals:   08/18/18 0844  BP: (!) 142/80  Pulse: (!) 59  Weight: 77.6 kg  Height: 6' (1.829 m)   Wt Readings from Last 3  Encounters:  08/18/18 77.6 kg  08/12/18 77.1 kg  08/11/18 79.6 kg    Labs: Lab Results  Component Value Date   NA 137 08/11/2018   K 3.4 (L) 08/11/2018   CL 100 08/11/2018   CO2 27 08/11/2018   GLUCOSE 93 08/11/2018   BUN 8 08/11/2018  CREATININE 1.26 (H) 08/11/2018   CALCIUM 9.1 08/11/2018   No results found for: INR Lab Results  Component Value Date   CHOL 226 (H) 07/06/2018   HDL 68.40 07/06/2018   LDLCALC 119 (H) 07/06/2018   TRIG 190.0 (H) 07/06/2018     GEN- The patient is well appearing, alert and oriented x 3 today.   Head- normocephalic, atraumatic Eyes-  Sclera clear, conjunctiva pink Ears- hearing intact Oropharynx- clear Neck- supple, no JVP Lymph- no cervical lymphadenopathy Lungs- Clear to ausculation bilaterally, normal work of breathing Heart- Regular rate and rhythm, no murmurs, rubs or gallops, PMI not laterally displaced GI- soft, NT, ND, + BS Extremities- no clubbing, cyanosis, or edema MS- no significant deformity or atrophy Skin- no rash or lesion Psych- euthymic mood, full affect Neuro- strength and sensation are intact  EKG-Sinus brady at 59 bpm with PAC's,, Pr int 184 ms, qrs int 80 ms, qtc 465 ms   Assessment and Plan: 1. Paroxysmal afib H/o of this, in SR today  Triggers discussed  Continue carvedilol to 6.125 mg bid for help in controlling afib, BP and LV dysfunction, not sure if it can be up titrated further with HR at 56 today  2 LV dysfunction  Needs updated echo, ordered today, and referral to cardiology placed  Coreg increased as above Angioedema with ACE Hydralazine/Imdur stopped in [place] ?   May ned to be restarted in the future Weight stable No unusual shortness of breath today    3. HTN Better controlled with increase of BB Avoid salt   4. CHA2DS2VASc score of at 3 Continue  eliquis 5mg  bid 30 day coupon given Stop asa  Echo pending  Referral to cardiology pending  afib clinic as needed  Lupita Leash C. [facility] [address] [place], [place] [address] [phone]

## 2018-08-18 NOTE — Telephone Encounter (Signed)
Are you going to be prescribing this for patient?

## 2018-08-23 NOTE — Telephone Encounter (Signed)
Medication covered by insurance. No preauth required.

## 2018-09-07 ENCOUNTER — Ambulatory Visit (HOSPITAL_COMMUNITY): Admission: RE | Admit: 2018-09-07 | Payer: Medicare Other | Source: Ambulatory Visit

## 2018-09-09 ENCOUNTER — Ambulatory Visit: Payer: Medicare Other | Admitting: Family Medicine

## 2018-09-21 ENCOUNTER — Ambulatory Visit (HOSPITAL_COMMUNITY)
Admission: RE | Admit: 2018-09-21 | Discharge: 2018-09-21 | Disposition: A | Discharge status: Home / Self Care | Payer: Medicare Other | Source: Ambulatory Visit | Attending: Nurse Practitioner | Admitting: Nurse Practitioner

## 2018-09-21 DIAGNOSIS — E785 Hyperlipidemia, unspecified: Secondary | ICD-10-CM | POA: Diagnosis not present

## 2018-09-21 DIAGNOSIS — I480 Paroxysmal atrial fibrillation: Secondary | ICD-10-CM

## 2018-09-21 DIAGNOSIS — I351 Nonrheumatic aortic (valve) insufficiency: Secondary | ICD-10-CM | POA: Insufficient documentation

## 2018-09-21 DIAGNOSIS — I119 Hypertensive heart disease without heart failure: Secondary | ICD-10-CM | POA: Insufficient documentation

## 2018-09-21 DIAGNOSIS — I4891 Unspecified atrial fibrillation: Secondary | ICD-10-CM | POA: Diagnosis present

## 2018-09-21 DIAGNOSIS — E119 Type 2 diabetes mellitus without complications: Secondary | ICD-10-CM | POA: Insufficient documentation

## 2018-09-21 NOTE — Progress Notes (Signed)
  Echocardiogram 2D Echocardiogram has been performed.  Tye Savoy 09/21/2018, 8:49 AM

## 2018-10-20 ENCOUNTER — Ambulatory Visit: Payer: Medicare Other | Admitting: Family Medicine

## 2018-10-21 ENCOUNTER — Encounter: Payer: Self-pay | Admitting: Cardiology

## 2018-10-21 ENCOUNTER — Ambulatory Visit (INDEPENDENT_AMBULATORY_CARE_PROVIDER_SITE_OTHER): Payer: Medicare Other | Admitting: Cardiology

## 2018-10-21 VITALS — BP 138/76 | HR 57 | Ht 72.0 in | Wt 170.8 lb

## 2018-10-21 DIAGNOSIS — I10 Essential (primary) hypertension: Secondary | ICD-10-CM

## 2018-10-21 DIAGNOSIS — Z7901 Long term (current) use of anticoagulants: Secondary | ICD-10-CM

## 2018-10-21 DIAGNOSIS — Z720 Tobacco use: Secondary | ICD-10-CM

## 2018-10-21 DIAGNOSIS — I5042 Chronic combined systolic (congestive) and diastolic (congestive) heart failure: Secondary | ICD-10-CM

## 2018-10-21 DIAGNOSIS — Z7189 Other specified counseling: Secondary | ICD-10-CM

## 2018-10-21 DIAGNOSIS — I480 Paroxysmal atrial fibrillation: Secondary | ICD-10-CM

## 2018-10-21 DIAGNOSIS — R0683 Snoring: Secondary | ICD-10-CM

## 2018-10-21 MED ORDER — LOSARTAN POTASSIUM 25 MG PO TABS: 25.0000 mg | ORAL_TABLET | Freq: Every day | ORAL | 3 refills | Status: DC

## 2018-10-21 NOTE — Patient Instructions (Addendum)
Medication Instructions:  Restart: Losartan 25 mg daily  If you need a refill on your cardiac medications before your next appointment, please call your pharmacy.   Lab work: Your physician recommends that you return for lab work in 1 week ( BMP)  If you have labs (blood work) drawn today and your tests are completely normal, you will receive your results only by: [name] MyChart Message (if you have MyChart) OR . A paper copy in the mail If you have any lab test that is abnormal or we need to change your treatment, we will call you to review the results.  Testing/Procedures: Your physician has recommended that you have a sleep study. This test records several body functions during sleep, including: brain activity, eye movement, oxygen and carbon dioxide blood levels, heart rate and rhythm, breathing rate and rhythm, the flow of air through your mouth and nose, snoring, body muscle movements, and chest and belly movement. [facility]   Follow-Up: At [facility], you and your health needs are our priority.  As part of our continuing mission to provide you with exceptional heart care, we have created designated Provider Care Teams.  These Care Teams include your primary Cardiologist (physician) and Advanced Practice Providers (APPs -  Physician Assistants and Nurse Practitioners) who all work together to provide you with the care you need, when you need it. You will need a follow up appointment in 6 weeks.  Please call our office 2 months in advance to schedule this appointment.  You may see Jodelle Red, MD or one of the following Advanced Practice Providers on your designated Care Team:   Theodore Demark, PA-C . Joni Reining, DNP, ANP   Do the following things EVERY DAY:  1) Weigh yourself EVERY morning after you go to the bathroom but before you eat or drink anything. Write this number down in a weight log/diary. If you gain 3 pounds overnight or 5 pounds in a week, call  the office.  2) Take your medicines as prescribed. If you have concerns about your medications, please call us before you stop taking them.   3) Eat low salt foods-Limit salt (sodium) to 2000 mg per day. This will help prevent your body from holding onto fluid. Read food labels as many processed foods have a lot of sodium, especially canned goods and prepackaged meats. If you would like some assistance choosing low sodium foods, we would be happy to set you up with a nutritionist.  4) Stay as active as you can everyday. Staying active will give you more energy and make your muscles stronger. Start with 5 minutes at a time and work your way up to 30 minutes a day. Break up your activities--do some in the morning and some in the afternoon. Start with 3 days per week and work your way up to 5 days as you can.  If you have chest pain, feel short of breath, dizzy, or lightheaded, STOP. If you don't feel better after a short rest, call 911. If you do feel better, call the office to let us know you have symptoms with exercise.  5) Limit all fluids for the day to less than 2 liters. Fluid includes all drinks, coffee, juice, ice chips, soup, jello, and all other liquids.

## 2018-10-21 NOTE — Progress Notes (Signed)
Cardiology Office Note:    Date:  10/21/2018   ID:  Kenneth [name], DOB 1960/09/26, MRN [id number]  PCP:  Mliss Sax, MD  Cardiologist:  Jodelle Red, MD PhD  Referring MD: Mliss Sax,*   CC: establish care  History of Present Illness:    Kenneth [name] is a 58 y.o. male with a hx of hypertension, diabetes, tobacco use, non-ischemic cardiomyopathy with EF 30-35% who is seen as a new patient to me at the request of Mliss Sax,* for the evaluation and management of paroxysmal atrial fibrillation.  Cardiac history: he followed with a cardiologist in [place], then in [place], but he recently moved back to the area. Per care everywhere notes: history of angioedema on ACEi, was on losartan and tolerating in 2016; however in [place] he was not on ARB but hydralazine instead. Cath/echo in 2011 showed normal coronaries, reduced LV function with EF 40%. He was admitted for COPD and HF exacerbation in 11/2017 in [facility], and at that time he was on pradaxa for anticoagulation. He was seen by Rudi Coco in the afib clinic in 07/2018, started on apixaban and carvedilol uptitrated. Echo repeated 08/2018, below.   Patient concerns today: overall feeling fair, does have fatigue. Breathing is fair, uses nebulizer machine several times per day. Has rare chest dullness in left upper pectoral region, intermittent, with exertion. Has only had heart cath in 2011, none since. Has occasional constipation, discussed laxatives today. Also discussed erectile dysfunction and sexual activity, he does use nitroglycerin several times/month, discussed interactions with PD inhibitors and nitroglycerin. Discussed recommendations for activity level and exercise.  Does note that he stops breathing at night, feels fatigued in the morning. Epworth sleepiness scale is 9, and he has risk factors of hypertension, atrial fibrillation, lack of sex drive, stress, excessive daytime sleepiness,  COPD, diabetes, memory issues, and morning dry mouth in addition. Has never had a sleep study. Girlfriend uses a CPAP, so he is familiar with it.   Atrial fib: can feel when he is in atrial fibrillation. Does not occur often. No syncope. Tolerating apixaban without bleeding issues. Would like savings coupon for this.  HF: currently on carvedilol 6.25 mg BID, furosemide 20 mg. Reports swelling with lisinopril, was tolerating losartan. Got sent losartan in the mail, didn't take it yet as wasn't on his medication list. Weighs himself at home, has been stable. Discussed diet and exercise recommendations. Takes lasix every day.   Past Medical History:  Diagnosis Date  . Arthritis   . Asthma   . Atrial fibrillation (HCC)   . Depression   . Diabetes mellitus without complication (HCC)   . Emphysema of lung (HCC)   . Heart murmur   . Hyperlipidemia   . Hypertension     History reviewed. No pertinent surgical history.  Current Medications: Current Outpatient Medications on File Prior to Visit  Medication Sig  . albuterol (PROVENTIL HFA;VENTOLIN HFA) 108 (90 Base) MCG/ACT inhaler Inhale 1 puff into the lungs as needed for wheezing.   [name] albuterol (PROVENTIL) (2.5 MG/3ML) 0.083% nebulizer solution Take 3 mLs (2.5 mg total) by nebulization 5 (five) times daily as needed for wheezing or shortness of breath.  [name] amLODipine (NORVASC) 5 MG tablet Take 1 tablet (5 mg total) by mouth daily.  [name] apixaban (ELIQUIS) 5 MG TABS tablet Take 1 tablet (5 mg total) by mouth 2 (two) times daily.  . busPIRone (BUSPAR) 15 MG tablet Take 1 tablet (15 mg total) by mouth 2 (two)  times daily.  . carvedilol (COREG) 6.25 MG tablet Take 1 tablet (6.25 mg total) by mouth 2 (two) times daily.  . famotidine (PEPCID) 20 MG tablet Take 1 tablet (20 mg total) by mouth daily.  [name]. FLUoxetine (PROZAC) 20 MG capsule Take 3 capsules (60 mg total) by mouth daily.  . furosemide (LASIX) 20 MG tablet Take 1 tablet (20 mg total) by mouth  daily.  . metFORMIN (GLUCOPHAGE) 500 MG tablet Take 1 tablet (500 mg total) by mouth 2 (two) times daily with a meal.  . nicotine (NICODERM CQ - DOSED IN MG/24 HR) 7 mg/24hr patch Place 1 patch (7 mg total) onto the skin daily.  . nitroGLYCERIN (NITROSTAT) 0.4 MG SL tablet Place 1 tablet (0.4 mg total) under the tongue every 5 (five) minutes as needed for chest pain.  . polyethylene glycol powder (GLYCOLAX/MIRALAX) powder Take 17 g by mouth 2 (two) times daily as needed.  [name]. TRAMADOL HCL PO Take by mouth as needed.  . zolpidem (AMBIEN CR) 12.5 MG CR tablet Take 1 tablet (12.5 mg total) by mouth at bedtime as needed for sleep.   No current facility-administered medications on file prior to visit.      Allergies:   Lisinopril and Tomato   Social History   Socioeconomic History  . Marital status: Legally Separated    Spouse name: Not on file  . Number of children: Not on file  . Years of education: Not on file  . Highest education level: Not on file  Occupational History  . Not on file  Social Needs  . Financial resource strain: Not on file  . Food insecurity:    Worry: Not on file    Inability: Not on file  . Transportation needs:    Medical: Not on file    Non-medical: Not on file  Tobacco Use  . Smoking status: Current Every Day Smoker    Packs/day: 0.50    Types: Cigarettes  . Smokeless tobacco: Never Used  Substance and Sexual Activity  . Alcohol use: Yes    Comment: Drinks up to six beers around a game  . Drug use: Yes    Types: Marijuana    Comment: occ marijuana  . Sexual activity: Yes    Partners: Female  Lifestyle  . Physical activity:    Days per week: Not on file    Minutes per session: Not on file  . Stress: Not on file  Relationships  . Social connections:    Talks on phone: Not on file    Gets together: Not on file    Attends religious service: Not on file    Active member of club or organization: Not on file    Attends meetings of clubs or  organizations: Not on file    Relationship status: Not on file  Other Topics Concern  . Not on file  Social History Narrative  . Not on file     Family History: The patient's family history includes Coronary artery disease in his brother; Diabetes in his sister. Father also had unknown heart disease. Mother had cancer.   ROS:   Please see the history of present illness.  Additional pertinent ROS:  Constitutional: Negative for chills, fever, night sweats, unintentional weight loss  HENT: Negative for ear pain and hearing loss.   Eyes: Negative for loss of vision and eye pain.  Respiratory: Positive for mild cough, shortness of breath. Negative for sputum, wheezing.   Cardiovascular: Negative for chest pain,  palpitations, PND, orthopnea, lower extremity edema and claudication.  Gastrointestinal: Negative for abdominal pain, melena, and hematochezia.  Genitourinary: Negative for dysuria and hematuria.  Musculoskeletal: Negative for falls and myalgias.  Skin: Negative for itching and rash.  Neurological: Negative for focal weakness, focal sensory changes and loss of consciousness.  Endo/Heme/Allergies: Does not bruise/bleed easily.    EKGs/Labs/Other Studies Reviewed:    The following studies were reviewed today: Echo 09/21/18 Left ventricle: The cavity size was normal. Wall thickness was   normal. Systolic function was moderately to severely reduced. The   estimated ejection fraction was in the range of 30% to 35%.   Diffuse hypokinesis. There is hypokinesis of the inferior and   inferoseptal myocardium. Features are consistent with a   pseudonormal left ventricular filling pattern, with concomitant   abnormal relaxation and increased filling pressure (grade 2   diastolic dysfunction). - Aortic valve: Trileaflet; moderately thickened, moderately   calcified leaflets. There was mild regurgitation. - Left atrium: The atrium was moderately dilated. - Tricuspid valve: There was  trivial regurgitation. - Pulmonary arteries: Systolic pressure was mildly increased. PA   peak pressure: 32 mm Hg (S).  EKG:  EKG is personally reviewed.  The ekg ordered today demonstrates sinus bradycardia with LVH  Recent Labs: 07/06/2018: ALT 16; TSH 4.32 07/28/2018: B Natriuretic Peptide 1,379.1 08/11/2018: BUN 8; Creatinine, Ser 1.26; Hemoglobin 13.9; Platelets 244; Potassium 3.4; Sodium 137  Recent Lipid Panel    Component Value Date/Time   CHOL 226 (H) 07/06/2018 1355   TRIG 190.0 (H) 07/06/2018 1355   HDL 68.40 07/06/2018 1355   CHOLHDL 3 07/06/2018 1355   VLDL 38.0 07/06/2018 1355   LDLCALC 119 (H) 07/06/2018 1355    Physical Exam:    VS:  BP 138/76 (BP Location: Left Arm, Patient Position: Sitting, Cuff Size: Normal)   Pulse (!) 57   Ht 6' (1.829 m)   Wt 170 lb 12.8 oz (77.5 kg)   BMI 23.16 kg/m     Wt Readings from Last 3 Encounters:  10/21/18 170 lb 12.8 oz (77.5 kg)  08/18/18 171 lb (77.6 kg)  08/12/18 170 lb (77.1 kg)     GEN: Well nourished, well developed in no acute distress HEENT: Normal NECK: No JVD; No carotid bruits LYMPHATICS: No lymphadenopathy CARDIAC: regular rhythm, normal S1 and S2, no murmurs, rubs, gallops. Radial and DP pulses 2+ bilaterally. RESPIRATORY:  Clear to auscultation without rales, wheezing or rhonchi  ABDOMEN: Soft, non-tender, non-distended MUSCULOSKELETAL:  No edema; No deformity  SKIN: Warm and dry NEUROLOGIC:  Alert and oriented x 3 PSYCHIATRIC:  Normal affect   ASSESSMENT:    1. Chronic combined systolic and diastolic heart failure (HCC)   2. Paroxysmal atrial fibrillation (HCC)   3. Anticoagulant long-term use   4. Essential hypertension   5. Snoring   6. Encounter for education about heart failure   7. Tobacco abuse    PLAN:    1. Chronic systolic and diastolic heart failure: euvolemic today  -continue carvedilol 6.25 mg BID. Given bradycardia, cannot uptitrate  -was tolerating losartan in the past. Will  restart losartan 25 mg daily (as he already has this at home)  -discussed entresto today. Plan will be to make sure he tolerates losartan first . He is concerned about cost, will need to address before he is started on entresto  -given heart failure education, below  2. Paroxysmal atrial fibrillation  -CHA2DS2/VAS Stroke Risk Points= 3, tolerating apixaban   3. Hypertension: adding losartan  back today. If additional BP room needed to titrate medications, would stop amlodipine.   4. Tobacco use: The patient was counseled on tobacco cessation today for 5 minutes.  Counseling included reviewing the risks of smoking tobacco products, how it impacts the patient's current medical diagnoses and different strategies for quitting.  Pharmacotherapy to aid in tobacco cessation was not prescribed today.  5. Heart failure education Do the following things EVERY DAY:  1) Weigh yourself EVERY morning after you go to the bathroom but before you eat or drink anything. Write this number down in a weight log/diary. If you gain 3 pounds overnight or 5 pounds in a week, call the office.  2) Take your medicines as prescribed. If you have concerns about your medications, please call us before you stop taking them.   3) Eat low salt foods-Limit salt (sodium) to 2000 mg per day. This will help prevent your body from holding onto fluid. Read food labels as many processed foods have a lot of sodium, especially canned goods and prepackaged meats. If you would like some assistance choosing low sodium foods, we would be happy to set you up with a nutritionist.  4) Stay as active as you can everyday. Staying active will give you more energy and make your muscles stronger. Start with 5 minutes at a time and work your way up to 30 minutes a day. Break up your activities--do some in the morning and some in the afternoon. Start with 3 days per week and work your way up to 5 days as you can.  If you have chest pain, feel short of  breath, dizzy, or lightheaded, STOP. If you don't feel better after a short rest, call 911. If you do feel better, call the office to let us know you have symptoms with exercise.  5) Limit all fluids for the day to less than 2 liters. Fluid includes all drinks, coffee, juice, ice chips, soup, jello, and all other liquids.  6. Snoring: significant symptoms, high Epworth sleepiness scale.  -ordered sleep study  7. Prevention and risk counseling -recommend heart healthy/Mediterranean diet, with whole grains, fruits, vegetable, fish, lean meats, nuts, and olive oil. Limit salt. -recommend moderate walking, 3-5 times/week for 30-50 minutes each session. Aim for at least 150 minutes.week. Goal should be pace of 3 miles/hours, or walking 1.5 miles in 30 minutes -recommend avoidance of tobacco products. Avoid excess alcohol. -Additional risk factor control:  -Diabetes: A1c is 6.1, on metformin. Would eventually like a SGLT2 inhibitor, but cost is a concern for him. Will continue to address  -Lipids: LDL 119. No CAD reported on cath, but not currently on a statin. High ASCVD risk. Continue to address, wants to change one medication at a time  -Blood pressure control: as above  -Weight: BMI 23. -ASCVD risk score: no known ASCVD The 10-year ASCVD risk score Denman George DC Jr., et al., 2013) is: 36.6%   Values used to calculate the score:     Age: 57 years     Sex: Male     Is Non-Hispanic African American: Yes     Diabetic: Yes     Tobacco smoker: Yes     Systolic Blood Pressure: 138 mmHg     Is BP treated: Yes     HDL Cholesterol: 68.4 mg/dL     Total Cholesterol: 226 mg/dL   Plan for follow up: 6 weeks for medication titration  TIME SPENT WITH PATIENT: >40 minutes of direct patient care. More than  50% of that time was spent on coordination of care and counseling regarding heart failure, atrial fibrillation, and cardiovascular risk.  Jodelle RedBridgette Kelechi Orgeron, MD, PhD [place]  [facility]    Medication Adjustments/Labs and Tests Ordered: Current medicines are reviewed at length with the patient today.  Concerns regarding medicines are outlined above.  Orders Placed This Encounter  Procedures  . Basic metabolic panel  . EKG 12-Lead   Meds ordered this encounter  Medications  . losartan (COZAAR) 25 MG tablet    Sig: Take 1 tablet (25 mg total) by mouth daily.    Dispense:  90 tablet    Refill:  3    Please do not fill until patient calls for the prescription, as he has some at home.    Patient Instructions  Medication Instructions:  Restart: Losartan 25 mg daily  If you need a refill on your cardiac medications before your next appointment, please call your pharmacy.   Lab work: Your physician recommends that you return for lab work in 1 week ( BMP)  If you have labs (blood work) drawn today and your tests are completely normal, you will receive your results only by: [name]. MyChart Message (if you have MyChart) OR . A paper copy in the mail If you have any lab test that is abnormal or we need to change your treatment, we will call you to review the results.  Testing/Procedures: Your physician has recommended that you have a sleep study. This test records several body functions during sleep, including: brain activity, eye movement, oxygen and carbon dioxide blood levels, heart rate and rhythm, breathing rate and rhythm, the flow of air through your mouth and nose, snoring, body muscle movements, and chest and belly movement. [facility]   Follow-Up: At [facility], you and your health needs are our priority.  As part of our continuing mission to provide you with exceptional heart care, we have created designated Provider Care Teams.  These Care Teams include your primary Cardiologist (physician) and Advanced Practice Providers (APPs -  Physician Assistants and Nurse Practitioners) who all work together to provide you with the care you need, when you need  it. You will need a follow up appointment in 6 weeks.  Please call our office 2 months in advance to schedule this appointment.  You may see Jodelle RedBridgette Abdulkarim Eberlin, MD or one of the following Advanced Practice Providers on your designated Care Team:   Theodore DemarkRhonda Barrett, PA-C . Joni ReiningKathryn Lawrence, DNP, ANP   Do the following things EVERY DAY:  6) Weigh yourself EVERY morning after you go to the bathroom but before you eat or drink anything. Write this number down in a weight log/diary. If you gain 3 pounds overnight or 5 pounds in a week, call the office.  7) Take your medicines as prescribed. If you have concerns about your medications, please call us before you stop taking them.   8) Eat low salt foods-Limit salt (sodium) to 2000 mg per day. This will help prevent your body from holding onto fluid. Read food labels as many processed foods have a lot of sodium, especially canned goods and prepackaged meats. If you would like some assistance choosing low sodium foods, we would be happy to set you up with a nutritionist.  9) Stay as active as you can everyday. Staying active will give you more energy and make your muscles stronger. Start with 5 minutes at a time and work your way up to 30 minutes a  day. Break up your activities--do some in the morning and some in the afternoon. Start with 3 days per week and work your way up to 5 days as you can.  If you have chest pain, feel short of breath, dizzy, or lightheaded, STOP. If you don't feel better after a short rest, call 911. If you do feel better, call the office to let us know you have symptoms with exercise.  10) Limit all fluids for the day to less than 2 liters. Fluid includes all drinks, coffee, juice, ice chips, soup, jello, and all other liquids.    Signed, Jodelle Red, MD PhD 10/21/2018 10:56 AM    [facility]

## 2018-10-22 ENCOUNTER — Telehealth: Payer: Self-pay | Admitting: *Deleted

## 2018-10-22 ENCOUNTER — Other Ambulatory Visit: Payer: Self-pay

## 2018-10-22 NOTE — Addendum Note (Signed)
Addended by: Parke Poisson on: 10/22/2018 09:29 AM   Modules accepted: Orders

## 2018-10-22 NOTE — Telephone Encounter (Signed)
Left sleep study appointment details on home answering machine. ( ok per DPR)

## 2018-10-22 NOTE — Telephone Encounter (Signed)
-----   Message from Parke Poisson, RN sent at 10/22/2018  9:29 AM EST ----- Sorry I just put it in. Thanks ----- Message ----- From: Gaynelle Cage, CMA Sent: 10/21/2018   4:45 PM EST To: Parke Poisson, RN  No order found ----- Message ----- From: Parke Poisson, RN Sent: 10/21/2018   9:32 AM EST To: [facility] Sleep Studies  Pt has an order for sleep study. Thanks

## 2018-10-27 ENCOUNTER — Other Ambulatory Visit: Payer: Self-pay | Admitting: *Deleted

## 2018-10-27 MED ORDER — LOSARTAN POTASSIUM 25 MG PO TABS: 25.0000 mg | ORAL_TABLET | Freq: Every day | ORAL | 1 refills | Status: DC

## 2018-10-28 LAB — BASIC METABOLIC PANEL
BUN / CREAT RATIO: 9 (ref 9–20)
BUN: 15 mg/dL (ref 6–24)
CO2: 30 mmol/L — ABNORMAL HIGH (ref 20–29)
CREATININE: 1.61 mg/dL — AB (ref 0.76–1.27)
Calcium: 10 mg/dL (ref 8.7–10.2)
Chloride: 89 mmol/L — ABNORMAL LOW (ref 96–106)
GFR, EST AFRICAN AMERICAN: 54 mL/min/{1.73_m2} — AB (ref >59)
GFR, EST NON AFRICAN AMERICAN: 47 mL/min/{1.73_m2} — AB (ref >59)
Glucose: 93 mg/dL (ref 65–99)
Potassium: 4.3 mmol/L (ref 3.5–5.2)
Sodium: 136 mmol/L (ref 134–144)

## 2018-11-03 ENCOUNTER — Other Ambulatory Visit: Payer: Self-pay

## 2018-11-03 DIAGNOSIS — Z79899 Other long term (current) drug therapy: Secondary | ICD-10-CM

## 2018-11-10 ENCOUNTER — Ambulatory Visit: Payer: Medicare Other | Admitting: Family Medicine

## 2018-11-15 ENCOUNTER — Encounter: Payer: Self-pay | Admitting: Family Medicine

## 2018-11-15 ENCOUNTER — Ambulatory Visit (INDEPENDENT_AMBULATORY_CARE_PROVIDER_SITE_OTHER): Payer: Medicare Other | Admitting: Family Medicine

## 2018-11-15 VITALS — BP 128/80 | HR 63 | Temp 97.4°F | Ht 72.0 in | Wt 166.4 lb

## 2018-11-15 DIAGNOSIS — F5105 Insomnia due to other mental disorder: Secondary | ICD-10-CM | POA: Diagnosis not present

## 2018-11-15 DIAGNOSIS — Z720 Tobacco use: Secondary | ICD-10-CM

## 2018-11-15 DIAGNOSIS — F418 Other specified anxiety disorders: Secondary | ICD-10-CM | POA: Diagnosis not present

## 2018-11-15 DIAGNOSIS — F99 Mental disorder, not otherwise specified: Secondary | ICD-10-CM

## 2018-11-15 MED ORDER — TRAZODONE HCL 50 MG PO TABS: 25.0000 mg | ORAL_TABLET | Freq: Every evening | ORAL | 3 refills | Status: DC | PRN

## 2018-11-15 MED ORDER — FLUOXETINE HCL 20 MG PO CAPS: 60.0000 mg | ORAL_CAPSULE | Freq: Every day | ORAL | 1 refills | Status: DC

## 2018-11-15 MED ORDER — BUSPIRONE HCL 15 MG PO TABS: 15.0000 mg | ORAL_TABLET | Freq: Two times a day (BID) | ORAL | 1 refills | Status: DC

## 2018-11-15 NOTE — Patient Instructions (Signed)
Steps to Quit Smoking  Smoking tobacco can be harmful to your health and can affect almost every organ in your body. Smoking puts you, and those around you, at risk for developing many serious chronic diseases. Quitting smoking is difficult, but it is one of the best things that you can do for your health. It is never too late to quit. What are the benefits of quitting smoking? When you quit smoking, you lower your risk of developing serious diseases and conditions, such as:  Lung cancer or lung disease, such as COPD.  Heart disease.  Stroke.  Heart attack.  Infertility.  Osteoporosis and bone fractures. Additionally, symptoms such as coughing, wheezing, and shortness of breath may get better when you quit. You may also find that you get sick less often because your body is stronger at fighting off colds and infections. If you are pregnant, quitting smoking can help to reduce your chances of having a baby of low birth weight. How do I get ready to quit? When you decide to quit smoking, create a plan to make sure that you are successful. Before you quit:  Pick a date to quit. Set a date within the next two weeks to give you time to prepare.  Write down the reasons why you are quitting. Keep this list in places where you will see it often, such as on your bathroom mirror or in your car or wallet.  Identify the people, places, things, and activities that make you want to smoke (triggers) and avoid them. Make sure to take these actions: ? Throw away all cigarettes at home, at work, and in your car. ? Throw away smoking accessories, such as ashtrays and lighters. ? Clean your car and make sure to empty the ashtray. ? Clean your home, including curtains and carpets.  Tell your family, friends, and coworkers that you are quitting. Support from your loved ones can make quitting easier.  Talk with your health care provider about your options for quitting smoking.  Find out what treatment  options are covered by your health insurance. What strategies can I use to quit smoking? Talk with your healthcare provider about different strategies to quit smoking. Some strategies include:  Quitting smoking altogether instead of gradually lessening how much you smoke over a period of time. Research shows that quitting "cold turkey" is more successful than gradually quitting.  Attending in-person counseling to help you build problem-solving skills. You are more likely to have success in quitting if you attend several counseling sessions. Even short sessions of 10 minutes can be effective.  Finding resources and support systems that can help you to quit smoking and remain smoke-free after you quit. These resources are most helpful when you use them often. They can include: ? Online chats with a counselor. ? Telephone quitlines. ? Printed self-help materials. ? Support groups or group counseling. ? Text messaging programs. ? Mobile phone applications.  Taking medicines to help you quit smoking. (If you are pregnant or breastfeeding, talk with your health care provider first.) Some medicines contain nicotine and some do not. Both types of medicines help with cravings, but the medicines that include nicotine help to relieve withdrawal symptoms. Your health care provider may recommend: ? Nicotine patches, gum, or lozenges. ? Nicotine inhalers or sprays. ? Non-nicotine medicine that is taken by mouth. Talk with your health care provider about combining strategies, such as taking medicines while you are also receiving in-person counseling. Using these two strategies together makes you   more likely to succeed in quitting than if you used either strategy on its own. If you are pregnant or breastfeeding, talk with your health care provider about finding counseling or other support strategies to quit smoking. Do not take medicine to help you quit smoking unless told to do so by your health care  provider. What things can I do to make it easier to quit? Quitting smoking might feel overwhelming at first, but there is a lot that you can do to make it easier. Take these important actions:  Reach out to your family and friends and ask that they support and encourage you during this time. Call telephone quitlines, reach out to support groups, or work with a counselor for support.  Ask people who smoke to avoid smoking around you.  Avoid places that trigger you to smoke, such as bars, parties, or smoke-break areas at work.  Spend time around people who do not smoke.  Lessen stress in your life, because stress can be a smoking trigger for some people. To lessen stress, try: ? Exercising regularly. ? Deep-breathing exercises. ? Yoga. ? Meditating. ? Performing a body scan. This involves closing your eyes, scanning your body from head to toe, and noticing which parts of your body are particularly tense. Purposefully relax the muscles in those areas.  Download or purchase mobile phone or tablet apps (applications) that can help you stick to your quit plan by providing reminders, tips, and encouragement. There are many free apps, such as QuitGuide from the CDC (Centers for Disease Control and Prevention). You can find other support for quitting smoking (smoking cessation) through smokefree.gov and other websites. How will I feel when I quit smoking? Within the first 24 hours of quitting smoking, you may start to feel some withdrawal symptoms. These symptoms are usually most noticeable 2-3 days after quitting, but they usually do not last beyond 2-3 weeks. Changes or symptoms that you might experience include:  Mood swings.  Restlessness, anxiety, or irritation.  Difficulty concentrating.  Dizziness.  Strong cravings for sugary foods in addition to nicotine.  Mild weight gain.  Constipation.  Nausea.  Coughing or a sore throat.  Changes in how your medicines work in your  body.  A depressed mood.  Difficulty sleeping (insomnia). After the first 2-3 weeks of quitting, you may start to notice more positive results, such as:  Improved sense of smell and taste.  Decreased coughing and sore throat.  Slower heart rate.  Lower blood pressure.  Clearer skin.  The ability to breathe more easily.  Fewer sick days. Quitting smoking is very challenging for most people. Do not get discouraged if you are not successful the first time. Some people need to make many attempts to quit before they achieve long-term success. Do your best to stick to your quit plan, and talk with your health care provider if you have any questions or concerns. This information is not intended to replace advice given to you by your health care provider. Make sure you discuss any questions you have with your health care provider. Document Released: 09/02/2001 Document Revised: 04/14/2017 Document Reviewed: 01/23/2015 Elsevier Interactive Patient Education  2019 Elsevier Inc.  

## 2018-11-15 NOTE — Progress Notes (Signed)
Established Patient Office Visit  Subjective:  Patient ID: Kenneth [name], male    DOB: 03/24/1961  Age: 58 y.o. MRN: [id number]  CC:  Chief Complaint  Patient presents with  . Follow-up    HPI Kenneth [name] presents for follow-up of his anxiety anxiety depression and insomnia.  Patient has been compliant with his Prozac.  He is taking a high dose for some time now with good response he tells me.  He is taking the BuSpar twice daily.  He requests a medication to help him sleep as well.  He is only been getting about 3 hours at night.  Continues to smoke on occasion.  He does consume marijuana regularly.  He does drink alcohol on the weekends.  He consumes up to a 12 pack over the weekend.  He is followed by cardiology for his atrial fibrillation combined heart failure.  Patient tells me that he has a sleep study scheduled.  Past Medical History:  Diagnosis Date  . Arthritis   . Asthma   . Atrial fibrillation (HCC)   . Depression   . Diabetes mellitus without complication (HCC)   . Emphysema of lung (HCC)   . Heart murmur   . Hyperlipidemia   . Hypertension     History reviewed. No pertinent surgical history.  Family History  Problem Relation Age of Onset  . Diabetes Sister   . Coronary artery disease Brother     Social History   Socioeconomic History  . Marital status: Legally Separated    Spouse name: Not on file  . Number of children: Not on file  . Years of education: Not on file  . Highest education level: Not on file  Occupational History  . Not on file  Social Needs  . Financial resource strain: Not on file  . Food insecurity:    Worry: Not on file    Inability: Not on file  . Transportation needs:    Medical: Not on file    Non-medical: Not on file  Tobacco Use  . Smoking status: Current Every Day Smoker    Packs/day: 0.50    Types: Cigarettes  . Smokeless tobacco: Never Used  Substance and Sexual Activity  . Alcohol use: Yes    Comment: Drinks up to  six beers around a game  . Drug use: Yes    Types: Marijuana    Comment: occ marijuana  . Sexual activity: Yes    Partners: Female  Lifestyle  . Physical activity:    Days per week: Not on file    Minutes per session: Not on file  . Stress: Not on file  Relationships  . Social connections:    Talks on phone: Not on file    Gets together: Not on file    Attends religious service: Not on file    Active member of club or organization: Not on file    Attends meetings of clubs or organizations: Not on file    Relationship status: Not on file  . Intimate partner violence:    Fear of current or ex partner: Not on file    Emotionally abused: Not on file    Physically abused: Not on file    Forced sexual activity: Not on file  Other Topics Concern  . Not on file  Social History Narrative  . Not on file    Outpatient Medications Prior to Visit  Medication Sig Dispense Refill  . albuterol (PROVENTIL HFA;VENTOLIN HFA) 108 (90 Base) MCG/ACT  inhaler Inhale 1 puff into the lungs as needed for wheezing.     [name] albuterol (PROVENTIL) (2.5 MG/3ML) 0.083% nebulizer solution Take 3 mLs (2.5 mg total) by nebulization 5 (five) times daily as needed for wheezing or shortness of breath. 150 mL 3  . amLODipine (NORVASC) 5 MG tablet Take 1 tablet (5 mg total) by mouth daily. 90 tablet 3  . apixaban (ELIQUIS) 5 MG TABS tablet Take 1 tablet (5 mg total) by mouth 2 (two) times daily. 180 tablet 2  . carvedilol (COREG) 6.25 MG tablet Take 1 tablet (6.25 mg total) by mouth 2 (two) times daily. 60 tablet 3  . famotidine (PEPCID) 20 MG tablet Take 1 tablet (20 mg total) by mouth daily. 90 tablet 2  . furosemide (LASIX) 20 MG tablet Take 1 tablet (20 mg total) by mouth daily. 90 tablet 3  . losartan (COZAAR) 25 MG tablet Take 1 tablet (25 mg total) by mouth daily. 90 tablet 1  . metFORMIN (GLUCOPHAGE) 500 MG tablet Take 1 tablet (500 mg total) by mouth 2 (two) times daily with a meal. 180 tablet 3  . nicotine  (NICODERM CQ - DOSED IN MG/24 HR) 7 mg/24hr patch Place 1 patch (7 mg total) onto the skin daily. 28 patch 0  . nitroGLYCERIN (NITROSTAT) 0.4 MG SL tablet Place 1 tablet (0.4 mg total) under the tongue every 5 (five) minutes as needed for chest pain. 25 tablet 5  . polyethylene glycol powder (GLYCOLAX/MIRALAX) powder Take 17 g by mouth 2 (two) times daily as needed. 3350 g 1  . zolpidem (AMBIEN CR) 12.5 MG CR tablet Take 1 tablet (12.5 mg total) by mouth at bedtime as needed for sleep. 15 tablet 0  . busPIRone (BUSPAR) 15 MG tablet Take 1 tablet (15 mg total) by mouth 2 (two) times daily. 180 tablet 1  . FLUoxetine (PROZAC) 20 MG capsule Take 3 capsules (60 mg total) by mouth daily. 270 capsule 1  . TRAMADOL HCL PO Take by mouth as needed.     No facility-administered medications prior to visit.     Allergies  Allergen Reactions  . Lisinopril Swelling  . Tomato Rash    ROS Review of Systems  Constitutional: Negative for diaphoresis, fatigue, fever and unexpected weight change.  HENT: Negative.   Eyes: Negative for photophobia and visual disturbance.  Respiratory: Negative.   Cardiovascular: Negative.   Gastrointestinal: Negative.   Endocrine: Negative for polyphagia and polyuria.  Genitourinary: Negative.   Musculoskeletal: Negative for gait problem and joint swelling.  Skin: Negative for pallor and rash.  Allergic/Immunologic: Negative for immunocompromised state.  Neurological: Negative for headaches.  Hematological: Does not bruise/bleed easily.  Psychiatric/Behavioral: Positive for dysphoric mood and sleep disturbance. The patient is nervous/anxious.       Objective:    Physical Exam  Constitutional: He is oriented to person, place, and time. He appears well-developed and well-nourished. No distress.  HENT:  Head: Normocephalic and atraumatic.  Right Ear: External ear normal.  Left Ear: External ear normal.  Eyes: Right eye exhibits no discharge. Left eye exhibits no  discharge.  Neck: No JVD present. No tracheal deviation present.  Pulmonary/Chest: Effort normal. No stridor.  Neurological: He is alert and oriented to person, place, and time.  Skin: Skin is warm and dry. He is not diaphoretic.  Psychiatric: He has a normal mood and affect. His behavior is normal.    BP 128/80   Pulse 63   Temp (!) 97.4 F (36.3 C) (  Oral)   Ht 6' (1.829 m)   Wt 166 lb 6 oz (75.5 kg)   SpO2 97%   BMI 22.56 kg/m  Wt Readings from Last 3 Encounters:  11/15/18 166 lb 6 oz (75.5 kg)  10/21/18 170 lb 12.8 oz (77.5 kg)  08/18/18 171 lb (77.6 kg)   BP Readings from Last 3 Encounters:  11/15/18 128/80  10/21/18 138/76  08/18/18 (!) 142/80   Guideline developer:  UpToDate (see UpToDate for funding source) Date Released: June 2014  Health Maintenance Due  Topic Date Due  . FOOT EXAM  07/12/1971  . OPHTHALMOLOGY EXAM  07/12/1971    There are no preventive care reminders to display for this patient.  Lab Results  Component Value Date   TSH 4.32 07/06/2018   Lab Results  Component Value Date   WBC 5.8 08/11/2018   HGB 13.9 08/11/2018   HCT 45.2 08/11/2018   MCV 93.6 08/11/2018   PLT 244 08/11/2018   Lab Results  Component Value Date   NA 136 10/28/2018   K 4.3 10/28/2018   CO2 30 (H) 10/28/2018   GLUCOSE 93 10/28/2018   BUN 15 10/28/2018   CREATININE 1.61 (H) 10/28/2018   BILITOT 0.3 07/06/2018   ALKPHOS 67 07/06/2018   AST 16 07/06/2018   ALT 16 07/06/2018   PROT 7.1 07/06/2018   ALBUMIN 4.2 07/06/2018   CALCIUM 10.0 10/28/2018   ANIONGAP 10 08/11/2018   GFR 78.72 08/10/2018   Lab Results  Component Value Date   CHOL 226 (H) 07/06/2018   Lab Results  Component Value Date   HDL 68.40 07/06/2018   Lab Results  Component Value Date   LDLCALC 119 (H) 07/06/2018   Lab Results  Component Value Date   TRIG 190.0 (H) 07/06/2018   Lab Results  Component Value Date   CHOLHDL 3 07/06/2018   Lab Results  Component Value Date   HGBA1C  6.1 07/06/2018      Assessment & Plan:   Problem List Items Addressed This Visit      Other   Depression with anxiety - Primary   Relevant Medications   FLUoxetine (PROZAC) 20 MG capsule   busPIRone (BUSPAR) 15 MG tablet   traZODone (DESYREL) 50 MG tablet   Tobacco abuse   Insomnia due to other mental disorder   Relevant Medications   traZODone (DESYREL) 50 MG tablet      Meds ordered this encounter  Medications  . FLUoxetine (PROZAC) 20 MG capsule    Sig: Take 3 capsules (60 mg total) by mouth daily.    Dispense:  270 capsule    Refill:  1  . busPIRone (BUSPAR) 15 MG tablet    Sig: Take 1 tablet (15 mg total) by mouth 2 (two) times daily.    Dispense:  180 tablet    Refill:  1  . traZODone (DESYREL) 50 MG tablet    Sig: Take 0.5-1 tablets (25-50 mg total) by mouth at bedtime as needed for sleep.    Dispense:  30 tablet    Refill:  3    Follow-up: Return in about 6 months (around 05/16/2019).   Encouraged tobacco cessation patient was given information on steps to quit smoking.  He expressed my concern about his alcohol intake and its effect on his anxiety depression and sleep.  He will go ahead and have a sleep study done.  Patient was given trazodone to help him sleep but as needed basis.  He will continue his  Prozac and BuSpar.

## 2018-12-03 ENCOUNTER — Ambulatory Visit: Payer: Medicare Other | Admitting: Cardiology

## 2018-12-07 ENCOUNTER — Encounter (HOSPITAL_BASED_OUTPATIENT_CLINIC_OR_DEPARTMENT_OTHER): Payer: Medicare Other

## 2018-12-15 ENCOUNTER — Ambulatory Visit: Payer: Medicare Other | Admitting: *Deleted

## 2018-12-29 ENCOUNTER — Encounter (HOSPITAL_BASED_OUTPATIENT_CLINIC_OR_DEPARTMENT_OTHER): Payer: Medicare Other

## 2019-03-07 ENCOUNTER — Other Ambulatory Visit: Payer: Self-pay | Admitting: Family Medicine

## 2019-03-07 DIAGNOSIS — K219 Gastro-esophageal reflux disease without esophagitis: Secondary | ICD-10-CM

## 2019-03-17 ENCOUNTER — Other Ambulatory Visit (HOSPITAL_COMMUNITY)
Admission: RE | Admit: 2019-03-17 | Discharge: 2019-03-17 | Disposition: A | Discharge status: Home / Self Care | Payer: Medicare Other | Source: Ambulatory Visit | Attending: Cardiovascular Disease | Admitting: Cardiovascular Disease

## 2019-03-17 NOTE — Progress Notes (Signed)
Attempted to contact Kenneth [name] to inquire his arrival today for Covid 19 screening. Left voice message for him to call and reschedule for tomorrow. He has a procedure scheduled 6/28.

## 2019-03-20 ENCOUNTER — Encounter (HOSPITAL_BASED_OUTPATIENT_CLINIC_OR_DEPARTMENT_OTHER): Payer: Medicare Other

## 2019-03-25 ENCOUNTER — Other Ambulatory Visit (HOSPITAL_COMMUNITY)
Admission: RE | Admit: 2019-03-25 | Discharge: 2019-03-25 | Disposition: A | Discharge status: Home / Self Care | Payer: Medicare Other | Source: Ambulatory Visit | Attending: Cardiovascular Disease | Admitting: Cardiovascular Disease

## 2019-03-25 DIAGNOSIS — Z1159 Encounter for screening for other viral diseases: Secondary | ICD-10-CM | POA: Insufficient documentation

## 2019-03-25 DIAGNOSIS — Z01812 Encounter for preprocedural laboratory examination: Secondary | ICD-10-CM | POA: Insufficient documentation

## 2019-03-25 LAB — SARS CORONAVIRUS 2 (TAT 6-24 HRS): SARS Coronavirus 2: NEGATIVE

## 2019-03-27 ENCOUNTER — Other Ambulatory Visit (HOSPITAL_COMMUNITY): Payer: Self-pay | Admitting: Nurse Practitioner

## 2019-03-28 NOTE — Telephone Encounter (Signed)
Pt is a 89 yom requesting eliquis 5mg  wt 75.5kg, scr 1.61(10/28/18) lov w/ christopher (10/21/18)

## 2019-03-29 ENCOUNTER — Ambulatory Visit (HOSPITAL_BASED_OUTPATIENT_CLINIC_OR_DEPARTMENT_OTHER): Payer: Medicare Other | Attending: Cardiology | Admitting: Cardiovascular Disease

## 2019-03-29 ENCOUNTER — Other Ambulatory Visit: Payer: Self-pay

## 2019-03-29 DIAGNOSIS — G4761 Periodic limb movement disorder: Secondary | ICD-10-CM | POA: Insufficient documentation

## 2019-03-29 DIAGNOSIS — E119 Type 2 diabetes mellitus without complications: Secondary | ICD-10-CM | POA: Insufficient documentation

## 2019-03-29 DIAGNOSIS — G4730 Sleep apnea, unspecified: Secondary | ICD-10-CM | POA: Insufficient documentation

## 2019-03-29 DIAGNOSIS — Z7984 Long term (current) use of oral hypoglycemic drugs: Secondary | ICD-10-CM | POA: Diagnosis not present

## 2019-03-29 DIAGNOSIS — R0683 Snoring: Secondary | ICD-10-CM

## 2019-03-29 DIAGNOSIS — R0902 Hypoxemia: Secondary | ICD-10-CM | POA: Diagnosis not present

## 2019-03-29 DIAGNOSIS — Z7901 Long term (current) use of anticoagulants: Secondary | ICD-10-CM | POA: Diagnosis not present

## 2019-03-29 DIAGNOSIS — I10 Essential (primary) hypertension: Secondary | ICD-10-CM | POA: Insufficient documentation

## 2019-03-29 DIAGNOSIS — Z79899 Other long term (current) drug therapy: Secondary | ICD-10-CM | POA: Insufficient documentation

## 2019-03-31 ENCOUNTER — Other Ambulatory Visit: Payer: Self-pay | Admitting: Family Medicine

## 2019-03-31 ENCOUNTER — Telehealth: Payer: Self-pay

## 2019-03-31 DIAGNOSIS — I10 Essential (primary) hypertension: Secondary | ICD-10-CM

## 2019-03-31 DIAGNOSIS — I480 Paroxysmal atrial fibrillation: Secondary | ICD-10-CM

## 2019-03-31 MED ORDER — CARVEDILOL 6.25 MG PO TABS: 6.2500 mg | ORAL_TABLET | Freq: Two times a day (BID) | ORAL | 1 refills | Status: DC

## 2019-03-31 NOTE — Telephone Encounter (Signed)
REFILL carvedilol (COREG) 6.25 MG tablet  PHARMACY [facility] [place], [place] [phone] (Phone) [phone] (Fax)

## 2019-03-31 NOTE — Telephone Encounter (Signed)
Rx sent 

## 2019-03-31 NOTE — Telephone Encounter (Signed)
Copied from [facility] [id number]. Topic: General - Inquiry >> Mar 31, 2019 11:08 AM Mathis Bud wrote: Reason for CRM: Patient would like to speak with nurse or PCP regarding his sleep study.  Patient call back [phone]

## 2019-04-01 ENCOUNTER — Other Ambulatory Visit: Payer: Self-pay | Admitting: Family Medicine

## 2019-04-01 DIAGNOSIS — R079 Chest pain, unspecified: Secondary | ICD-10-CM

## 2019-04-03 ENCOUNTER — Other Ambulatory Visit: Payer: Self-pay | Admitting: Cardiology

## 2019-04-04 ENCOUNTER — Other Ambulatory Visit: Payer: Self-pay | Admitting: Family Medicine

## 2019-04-04 DIAGNOSIS — F418 Other specified anxiety disorders: Secondary | ICD-10-CM

## 2019-04-07 ENCOUNTER — Encounter (HOSPITAL_BASED_OUTPATIENT_CLINIC_OR_DEPARTMENT_OTHER): Payer: Self-pay | Admitting: Cardiovascular Disease

## 2019-04-07 ENCOUNTER — Telehealth: Payer: Self-pay | Admitting: *Deleted

## 2019-04-07 ENCOUNTER — Other Ambulatory Visit: Payer: Self-pay | Admitting: Cardiovascular Disease

## 2019-04-07 DIAGNOSIS — G4733 Obstructive sleep apnea (adult) (pediatric): Secondary | ICD-10-CM

## 2019-04-07 DIAGNOSIS — I480 Paroxysmal atrial fibrillation: Secondary | ICD-10-CM

## 2019-04-07 DIAGNOSIS — G4736 Sleep related hypoventilation in conditions classified elsewhere: Secondary | ICD-10-CM

## 2019-04-07 DIAGNOSIS — I10 Essential (primary) hypertension: Secondary | ICD-10-CM

## 2019-04-07 DIAGNOSIS — IMO0002 Reserved for concepts with insufficient information to code with codable children: Secondary | ICD-10-CM

## 2019-04-07 NOTE — Procedures (Signed)
Patient Name: Kenneth [name], Kenneth [name] Date: 03/29/2019 Gender: Male D.O.B: [birth year] Age (years): 57 Referring Provider: Jodelle Red Height (inches): 72 Interpreting Physician: Nicki Guadalajara MD, ABSM Weight (lbs): 157 RPSGT: Ulyess Mort BMI: 21 MRN: [id number] Neck Size: 14.75  CLINICAL INFORMATION Sleep Study Type: NPSG  Indication for sleep study: Diabetes, Hypertension, Snoring  Epworth Sleepiness Score: 7  SLEEP STUDY TECHNIQUE As per the AASM Manual for the Scoring of Sleep and Associated Events v2.3 (April 2016) with a hypopnea requiring 4% desaturations.  The channels recorded and monitored were frontal, central and occipital EEG, electrooculogram (EOG), submentalis EMG (chin), nasal and oral airflow, thoracic and abdominal wall motion, anterior tibialis EMG, snore microphone, electrocardiogram, and pulse oximetry.  MEDICATIONS     albuterol (PROVENTIL HFA;VENTOLIN HFA) 108 (90 Base) MCG/ACT inhaler             albuterol (PROVENTIL) (2.5 MG/3ML) 0.083% nebulizer solution         amLODipine (NORVASC) 5 MG tablet         busPIRone (BUSPAR) 15 MG tablet         carvedilol (COREG) 6.25 MG tablet         ELIQUIS 5 MG TABS tablet         famotidine (PEPCID) 20 MG tablet         FLUoxetine (PROZAC) 20 MG capsule         furosemide (LASIX) 20 MG tablet         losartan (COZAAR) 25 MG tablet         metFORMIN (GLUCOPHAGE) 500 MG tablet         nicotine (NICODERM CQ - DOSED IN MG/24 HR) 7 mg/24hr patch         nitroGLYCERIN (NITROSTAT) 0.4 MG SL tablet         polyethylene glycol powder (GLYCOLAX/MIRALAX) powder         traZODone (DESYREL) 50 MG tablet         zolpidem (AMBIEN CR) 12.5 MG CR tablet      Medications self-administered by patient taken the night of the study : N/A  SLEEP ARCHITECTURE The study was initiated at 10:48:05 PM and ended at 4:53:54 AM.  Sleep onset time was 3.4 minutes and the sleep efficiency was 87.5%%. The total sleep time  was 320 minutes.  Stage REM latency was N/A minutes.  The patient spent 45.5%% of the night in stage N1 sleep, 54.5%% in stage N2 sleep, 0.0%% in stage N3 and 0% in REM.  Alpha intrusion was absent.  Supine sleep was 51.53%.  RESPIRATORY PARAMETERS The overall apnea/hypopnea index (AHI) was 2.3 per hour. The repiratory disturbance index (RDI) was moderately elevated at 19.7/h. There were 0 total apneas, including 0 obstructive, 0 central and 0 mixed apneas. There were 12 hypopneas and 93 RERAs.  The AHI during Stage REM sleep was N/A per hour.  AHI while supine was 2.2 per hour.  The mean oxygen saturation was 91.3%. The minimum SpO2 during sleep was 78.0%.  Soft snoring was noted during this study.  CARDIAC DATA The 2 lead EKG demonstrated sinus rhythm. The mean heart rate was 73.0 beats per minute. Other EKG findings include: PVCs.  LEG MOVEMENT DATA The total PLMS were 0 with a resulting PLMS index of 0.0. Associated arousal with leg movement index was 7.3 .  IMPRESSIONS - Increased Upper airway resistance (AHI 2.3/h; however, RDI was 19.7). There was absence of REM sleep, and the AHI overall  may underestimate the severity of the patient's sleep disordered breathing - No significant central sleep apnea occurred during this study (CAI = 0.0/h). - Significant oxygen desaturation to a nadir of 78%. - The arousal index was significantly increased. - The patient snored with soft snoring volume. - EKG findings include PVCs. - Clinically significant periodic limb movements did not occur during sleep. Associated arousals were significant.  DIAGNOSIS - Sleep Apnea, unspecified G47.30 - Periodic Limb Movement During Sleep (327.51 [G47.61 ICD-10]) - Nocturnal Hypoxemia (327.26 [G47.36 ICD-10])  RECOMMENDATIONS - In this patient with significant cardiovascular comorbidities including CHF, PAF, and probable underestimation of the severity of sleep apnea by AHI (but RDI 19.7/h) with  absence of REM sleep and significant oxygen desaturation to 78%, recommend CPAP titration study for optimal therapy. - Efforts shouold be made to optimize nasal and oropharyngeal patency. - Avoid alcohol, sedatives and other CNS depressants that may worsen sleep apnea and disrupt normal sleep architecture. - Sleep hygiene should be reviewed to assess factors that may improve sleep quality. - Regular exercise should be initiated or continued if appropriate.  [Electronically signed] 04/07/2019 09:07 AM  Shelva Majestic MD, [facility], [place], American Board of Sleep Medicine   NPI: [id number] [place] PH: [phone]   FX: [phone] [place]

## 2019-04-07 NOTE — Telephone Encounter (Signed)
-----   Message from Troy Sine, MD sent at 04/07/2019  9:13 AM EDT ----- Mariann Laster  Please notify pt and set up for CPAP titration

## 2019-04-07 NOTE — Telephone Encounter (Signed)
Patient notified of sleep study results and recommendations. He agrees to CPAP titration study as recommended.

## 2019-04-08 ENCOUNTER — Telehealth: Payer: Self-pay | Admitting: *Deleted

## 2019-04-08 NOTE — Telephone Encounter (Signed)
-----   Message from Lauralee Evener, CMA sent at 04/07/2019  1:33 PM EDT ----- CPAP titration

## 2019-04-08 NOTE — Telephone Encounter (Signed)
Left CPAP and COVID appointment details on patients home VM ok per DPR.

## 2019-04-19 ENCOUNTER — Other Ambulatory Visit (HOSPITAL_COMMUNITY): Payer: Medicare Other

## 2019-04-19 ENCOUNTER — Other Ambulatory Visit (HOSPITAL_COMMUNITY)
Admission: RE | Admit: 2019-04-19 | Discharge: 2019-04-19 | Disposition: A | Discharge status: Home / Self Care | Payer: Medicare Other | Source: Ambulatory Visit | Attending: Cardiovascular Disease | Admitting: Cardiovascular Disease

## 2019-04-19 DIAGNOSIS — Z20828 Contact with and (suspected) exposure to other viral communicable diseases: Secondary | ICD-10-CM | POA: Diagnosis present

## 2019-04-19 LAB — SARS CORONAVIRUS 2 (TAT 6-24 HRS): SARS Coronavirus 2: NEGATIVE

## 2019-04-22 ENCOUNTER — Other Ambulatory Visit: Payer: Self-pay

## 2019-04-22 ENCOUNTER — Ambulatory Visit (HOSPITAL_BASED_OUTPATIENT_CLINIC_OR_DEPARTMENT_OTHER): Payer: Medicare Other | Attending: Cardiovascular Disease | Admitting: Cardiovascular Disease

## 2019-04-22 DIAGNOSIS — I10 Essential (primary) hypertension: Secondary | ICD-10-CM

## 2019-04-22 DIAGNOSIS — Z7901 Long term (current) use of anticoagulants: Secondary | ICD-10-CM | POA: Insufficient documentation

## 2019-04-22 DIAGNOSIS — Z79899 Other long term (current) drug therapy: Secondary | ICD-10-CM | POA: Diagnosis not present

## 2019-04-22 DIAGNOSIS — G4733 Obstructive sleep apnea (adult) (pediatric): Secondary | ICD-10-CM | POA: Diagnosis not present

## 2019-04-22 DIAGNOSIS — Z7984 Long term (current) use of oral hypoglycemic drugs: Secondary | ICD-10-CM | POA: Diagnosis not present

## 2019-04-22 DIAGNOSIS — G4736 Sleep related hypoventilation in conditions classified elsewhere: Secondary | ICD-10-CM | POA: Insufficient documentation

## 2019-04-22 DIAGNOSIS — I480 Paroxysmal atrial fibrillation: Secondary | ICD-10-CM

## 2019-04-22 DIAGNOSIS — IMO0002 Reserved for concepts with insufficient information to code with codable children: Secondary | ICD-10-CM

## 2019-04-25 ENCOUNTER — Encounter (HOSPITAL_BASED_OUTPATIENT_CLINIC_OR_DEPARTMENT_OTHER): Payer: Self-pay | Admitting: Cardiovascular Disease

## 2019-04-25 ENCOUNTER — Other Ambulatory Visit: Payer: Self-pay

## 2019-04-25 NOTE — Procedures (Signed)
Patient Name: Kenneth [name], Kenneth [name] Date: 04/22/2019 Gender: Male D.O.B: 02-Apr-1961 Age (years): 54 Referring Provider: Nicki Guadalajara MD, ABSM Height (inches): 70 Interpreting Physician: Nicki Guadalajara MD, ABSM Weight (lbs): 157 RPSGT: Cherylann Parr BMI: 23 MRN: [id number] Neck Size: 15.00  CLINICAL INFORMATION The patient is referred for a CPAP titration to treat sleep apnea.  Date of NPSG: 03/29/2019: AHI 2.3/h; RDI 19.7/h; absence of REM sleep; O2 nadir 78%.  SLEEP STUDY TECHNIQUE As per the AASM Manual for the Scoring of Sleep and Associated Events v2.3 (April 2016) with a hypopnea requiring 4% desaturations.  The channels recorded and monitored were frontal, central and occipital EEG, electrooculogram (EOG), submentalis EMG (chin), nasal and oral airflow, thoracic and abdominal wall motion, anterior tibialis EMG, snore microphone, electrocardiogram, and pulse oximetry. Continuous positive airway pressure (CPAP) was initiated at the beginning of the study and titrated to treat sleep-disordered breathing.  MEDICATIONS     albuterol (PROVENTIL HFA;VENTOLIN HFA) 108 (90 Base) MCG/ACT inhaler       albuterol (PROVENTIL) (2.5 MG/3ML) 0.083% nebulizer solution         amLODipine (NORVASC) 5 MG tablet         busPIRone (BUSPAR) 15 MG tablet         carvedilol (COREG) 6.25 MG tablet         ELIQUIS 5 MG TABS tablet         famotidine (PEPCID) 20 MG tablet         FLUoxetine (PROZAC) 20 MG capsule         furosemide (LASIX) 20 MG tablet         losartan (COZAAR) 25 MG tablet         metFORMIN (GLUCOPHAGE) 500 MG tablet         nicotine (NICODERM CQ - DOSED IN MG/24 HR) 7 mg/24hr patch         nitroGLYCERIN (NITROSTAT) 0.4 MG SL tablet         polyethylene glycol powder (GLYCOLAX/MIRALAX) powder         traZODone (DESYREL) 50 MG tablet         zolpidem (AMBIEN CR) 12.5 MG CR tablet      Medications self-administered by patient taken the night of the study : TRAZODONE   TECHNICIAN COMMENTS Comments added by technician: SIMPLUS FULL FACE MASK WAS USED FOR THIS STUDY  Comments added by scorer: N/A  RESPIRATORY PARAMETERS Optimal PAP Pressure (cm): 14 AHI at Optimal Pressure (/hr): 0.0 Overall Minimal O2 (%): 83.0 Supine % at Optimal Pressure (%): 0 Minimal O2 at Optimal Pressure (%): 85.0   SLEEP ARCHITECTURE The study was initiated at 10:24:39 PM and ended at 4:36:50 AM.  Sleep onset time was 0.4 minutes and the sleep efficiency was 88.7%%. The total sleep time was 330 minutes.  The patient spent 1.7% of the night in stage N1 sleep, 56.1%% in stage N2 sleep, 0.0%% in stage N3 and 42.3% in REM.Stage REM latency was 115.0 minutes  Wake after sleep onset was 41.8. Alpha intrusion was absent. Supine sleep was 47.38%.  CARDIAC DATA The 2 lead EKG demonstrated sinus rhythm. The mean heart rate was 50.7 beats per minute. Other EKG findings include: PVCs.  LEG MOVEMENT DATA The total Periodic Limb Movements of Sleep (PLMS) were 0. The PLMS index was 0.0. A PLMS index of <15 is considered normal in adults.  IMPRESSIONS - CPAP was initiated at 5 cm and was titrated to CPAP pressure at  14 cm of water; AHI 0, but O2 nadir 85% at 14 cwp. - Central sleep apnea was not noted during this titration (CAI = 0.0/h). - Moderate oxygen desaturations to a nadir of 83.0% at 11 cm. - No snoring was audible during this study. - With absence of REM sleep on the diagnostic study, suspect REM rebound with CPAP at 42.3% REM sleep on this evaluation. - 2-lead EKG demonstrated: PVCs - Clinically significant periodic limb movements were not noted during this study. Arousals associated with PLMs were rare.  DIAGNOSIS - Obstructive Sleep Apnea (327.23 [G47.33 ICD-10])  RECOMMENDATIONS - Recommend an initial trial of CPAP Auto therapy with EPR at 3 at 13 - 20 cm H2O with heated humidification. A Medium size Fisher&Paykel Full Face Mask Simplus mask was used for the titration. -  Efforts should be made to optimize nasal and oropharyngeaol patency. - Consider overnight oximetry to assess O2 saturation with CPAP. - Avoid alcohol, sedatives and other CNS depressants that may worsen sleep apnea and disrupt normal sleep architecture. - Sleep hygiene should be reviewed to assess factors that may improve sleep quality. - Weight management and regular exercise should be initiated or continued. - Recommend a download be obtained in 30 days and sleep clinic evaluation after 4 weeks of therapy   [Electronically signed] 04/25/2019 05:32 PM  Shelva Majestic MD, [place], American Board of Sleep Medicine   NPI: [id number] [place] PH: [phone]   FX: [phone] [place]

## 2019-04-25 NOTE — Procedures (Signed)
error 

## 2019-05-03 ENCOUNTER — Other Ambulatory Visit: Payer: Self-pay | Admitting: Family Medicine

## 2019-05-03 DIAGNOSIS — K219 Gastro-esophageal reflux disease without esophagitis: Secondary | ICD-10-CM

## 2019-05-05 ENCOUNTER — Telehealth: Payer: Self-pay | Admitting: *Deleted

## 2019-05-05 ENCOUNTER — Other Ambulatory Visit: Payer: Self-pay | Admitting: Family Medicine

## 2019-05-05 DIAGNOSIS — R079 Chest pain, unspecified: Secondary | ICD-10-CM

## 2019-05-05 NOTE — Telephone Encounter (Signed)
CPAP order sent to [facility].

## 2019-05-10 ENCOUNTER — Other Ambulatory Visit: Payer: Self-pay | Admitting: Cardiology

## 2019-05-18 ENCOUNTER — Ambulatory Visit: Payer: Medicare Other | Admitting: Family Medicine

## 2019-05-19 ENCOUNTER — Other Ambulatory Visit: Payer: Self-pay | Admitting: Family Medicine

## 2019-05-19 NOTE — Telephone Encounter (Signed)
lvm for pt to call back. Need to know which test strips he uses.

## 2019-05-19 NOTE — Telephone Encounter (Signed)
Copied from [place] [id number]. Topic: Quick Communication - Rx Refill/Question >> May 19, 2019 11:42 AM Rainey Pines A wrote: Medication: Glucose meter & test strips  Has the patient contacted their pharmacy? {Yes (Agent: If no, request that the patient contact the pharmacy for the refill.) (Agent: If yes, when and what did the pharmacy advise?)Contact PCP  Preferred Pharmacy (with phone number or street name): [id number] [address], [place] [phone] (Phone) [phone] (Fax)    Agent: Please be advised that RX refills may take up to 3 business days. We ask that you follow-up with your pharmacy.

## 2019-05-20 ENCOUNTER — Encounter: Payer: Self-pay | Admitting: Family Medicine

## 2019-05-20 ENCOUNTER — Other Ambulatory Visit: Payer: Self-pay

## 2019-05-20 ENCOUNTER — Ambulatory Visit (INDEPENDENT_AMBULATORY_CARE_PROVIDER_SITE_OTHER): Payer: Medicare Other | Admitting: Family Medicine

## 2019-05-20 VITALS — BP 120/70 | HR 79 | Ht 72.0 in | Wt 161.4 lb

## 2019-05-20 DIAGNOSIS — Z720 Tobacco use: Secondary | ICD-10-CM | POA: Diagnosis not present

## 2019-05-20 DIAGNOSIS — Z23 Encounter for immunization: Secondary | ICD-10-CM

## 2019-05-20 DIAGNOSIS — E119 Type 2 diabetes mellitus without complications: Secondary | ICD-10-CM

## 2019-05-20 DIAGNOSIS — Z0000 Encounter for general adult medical examination without abnormal findings: Secondary | ICD-10-CM

## 2019-05-20 DIAGNOSIS — N289 Disorder of kidney and ureter, unspecified: Secondary | ICD-10-CM

## 2019-05-20 DIAGNOSIS — F1010 Alcohol abuse, uncomplicated: Secondary | ICD-10-CM | POA: Diagnosis not present

## 2019-05-20 DIAGNOSIS — F418 Other specified anxiety disorders: Secondary | ICD-10-CM

## 2019-05-20 DIAGNOSIS — F5105 Insomnia due to other mental disorder: Secondary | ICD-10-CM

## 2019-05-20 DIAGNOSIS — J449 Chronic obstructive pulmonary disease, unspecified: Secondary | ICD-10-CM

## 2019-05-20 DIAGNOSIS — F99 Mental disorder, not otherwise specified: Secondary | ICD-10-CM

## 2019-05-20 LAB — URINALYSIS, ROUTINE W REFLEX MICROSCOPIC
Hgb urine dipstick: NEGATIVE
Leukocytes,Ua: NEGATIVE
Nitrite: NEGATIVE
Specific Gravity, Urine: 1.02 (ref 1.000–1.030)
Total Protein, Urine: 100 — AB
Urine Glucose: NEGATIVE
Urobilinogen, UA: 1 (ref 0.0–1.0)
pH: 6 (ref 5.0–8.0)

## 2019-05-20 LAB — CBC
HCT: 41.7 % (ref 39.0–52.0)
Hemoglobin: 13.8 g/dL (ref 13.0–17.0)
MCHC: 33.1 g/dL (ref 30.0–36.0)
MCV: 94.5 fl (ref 78.0–100.0)
Platelets: 276 10*3/uL (ref 150.0–400.0)
RBC: 4.42 Mil/uL (ref 4.22–5.81)
RDW: 13.5 % (ref 11.5–15.5)
WBC: 4.4 10*3/uL (ref 4.0–10.5)

## 2019-05-20 LAB — COMPREHENSIVE METABOLIC PANEL
ALT: 11 U/L (ref 0–53)
AST: 12 U/L (ref 0–37)
Albumin: 4.3 g/dL (ref 3.5–5.2)
Alkaline Phosphatase: 59 U/L (ref 39–117)
BUN: 10 mg/dL (ref 6–23)
CO2: 34 mEq/L — ABNORMAL HIGH (ref 19–32)
Calcium: 9.5 mg/dL (ref 8.4–10.5)
Chloride: 95 mEq/L — ABNORMAL LOW (ref 96–112)
Creatinine, Ser: 1.33 mg/dL (ref 0.40–1.50)
GFR: 66.86 mL/min (ref >60.00)
Glucose, Bld: 82 mg/dL (ref 70–99)
Potassium: 4.1 mEq/L (ref 3.5–5.1)
Sodium: 137 mEq/L (ref 135–145)
Total Bilirubin: 0.4 mg/dL (ref 0.2–1.2)
Total Protein: 6.7 g/dL (ref 6.0–8.3)

## 2019-05-20 LAB — GAMMA GT: GGT: 20 U/L (ref 7–51)

## 2019-05-20 LAB — PSA: PSA: 0.93 ng/mL (ref 0.10–4.00)

## 2019-05-20 LAB — HEMOGLOBIN A1C: Hgb A1c MFr Bld: 5.9 % (ref 4.6–6.5)

## 2019-05-20 MED ORDER — ONETOUCH ULTRA 2 W/DEVICE KIT: PACK | 0 refills | Status: DC

## 2019-05-20 MED ORDER — ONETOUCH ULTRASOFT LANCETS MISC: 12 refills | Status: DC

## 2019-05-20 MED ORDER — ONETOUCH ULTRA VI STRP: ORAL_STRIP | 12 refills | Status: DC

## 2019-05-20 NOTE — Patient Instructions (Signed)
Steps to Quit Smoking Smoking tobacco is the leading cause of preventable death. It can affect almost every organ in the body. Smoking puts you and people around you at risk for many serious, long-lasting (chronic) diseases. Quitting smoking can be hard, but it is one of the best things that you can do for your health. It is never too late to quit. How do I get ready to quit? When you decide to quit smoking, make a plan to help you succeed. Before you quit:  Pick a date to quit. Set a date within the next 2 weeks to give you time to prepare.  Write down the reasons why you are quitting. Keep this list in places where you will see it often.  Tell your family, friends, and co-workers that you are quitting. Their support is important.  Talk with your doctor about the choices that may help you quit.  Find out if your health insurance will pay for these treatments.  Know the people, places, things, and activities that make you want to smoke (triggers). Avoid them. What first steps can I take to quit smoking?  Throw away all cigarettes at home, at work, and in your car.  Throw away the things that you use when you smoke, such as ashtrays and lighters.  Clean your car. Make sure to empty the ashtray.  Clean your home, including curtains and carpets. What can I do to help me quit smoking? Talk with your doctor about taking medicines and seeing a counselor at the same time. You are more likely to succeed when you do both.  If you are pregnant or breastfeeding, talk with your doctor about counseling or other ways to quit smoking. Do not take medicine to help you quit smoking unless your doctor tells you to do so. To quit smoking: Quit right away  Quit smoking totally, instead of slowly cutting back on how much you smoke over a period of time.  Go to counseling. You are more likely to quit if you go to counseling sessions regularly. Take medicine You may take medicines to help you quit. Some  medicines need a prescription, and some you can buy over-the-counter. Some medicines may contain a drug called nicotine to replace the nicotine in cigarettes. Medicines may:  Help you to stop having the desire to smoke (cravings).  Help to stop the problems that come when you stop smoking (withdrawal symptoms). Your doctor may ask you to use:  Nicotine patches, gum, or lozenges.  Nicotine inhalers or sprays.  Non-nicotine medicine that is taken by mouth. Find resources Find resources and other ways to help you quit smoking and remain smoke-free after you quit. These resources are most helpful when you use them often. They include:  Online chats with a counselor.  Phone quitlines.  Printed self-help materials.  Support groups or group counseling.  Text messaging programs.  Mobile phone apps. Use apps on your mobile phone or tablet that can help you stick to your quit plan. There are many free apps for mobile phones and tablets as well as websites. Examples include Quit Guide from the CDC and smokefree.gov  What things can I do to make it easier to quit?   Talk to your family and friends. Ask them to support and encourage you.  Call a phone quitline (1-800-QUIT-NOW), reach out to support groups, or work with a counselor.  Ask people who smoke to not smoke around you.  Avoid places that make you want to smoke,   such as: ? Bars. ? Parties. ? Smoke-break areas at work.  Spend time with people who do not smoke.  Lower the stress in your life. Stress can make you want to smoke. Try these things to help your stress: ? Getting regular exercise. ? Doing deep-breathing exercises. ? Doing yoga. ? Meditating. ? Doing a body scan. To do this, close your eyes, focus on one area of your body at a time from head to toe. Notice which parts of your body are tense. Try to relax the muscles in those areas. How will I feel when I quit smoking? Day 1 to 3 weeks Within the first 24 hours,  you may start to have some problems that come from quitting tobacco. These problems are very bad 2-3 days after you quit, but they do not often last for more than 2-3 weeks. You may get these symptoms:  Mood swings.  Feeling restless, nervous, angry, or annoyed.  Trouble concentrating.  Dizziness.  Strong desire for high-sugar foods and nicotine.  Weight gain.  Trouble pooping (constipation).  Feeling like you may vomit (nausea).  Coughing or a sore throat.  Changes in how the medicines that you take for other issues work in your body.  Depression.  Trouble sleeping (insomnia). Week 3 and afterward After the first 2-3 weeks of quitting, you may start to notice more positive results, such as:  Better sense of smell and taste.  Less coughing and sore throat.  Slower heart rate.  Lower blood pressure.  Clearer skin.  Better breathing.  Fewer sick days. Quitting smoking can be hard. Do not give up if you fail the first time. Some people need to try a few times before they succeed. Do your best to stick to your quit plan, and talk with your doctor if you have any questions or concerns. Summary  Smoking tobacco is the leading cause of preventable death. Quitting smoking can be hard, but it is one of the best things that you can do for your health.  When you decide to quit smoking, make a plan to help you succeed.  Quit smoking right away, not slowly over a period of time.  When you start quitting, seek help from your doctor, family, or friends. This information is not intended to replace advice given to you by your health care provider. Make sure you discuss any questions you have with your health care provider. Document Released: 07/05/2009 Document Revised: 11/26/2018 Document Reviewed: 11/27/2018 Elsevier Patient Education  2020 Elsevier Inc.  

## 2019-05-20 NOTE — Telephone Encounter (Signed)
Pt being seen in office today, will order during visit.

## 2019-05-20 NOTE — Progress Notes (Signed)
Established Patient Office Visit  Subjective:  Patient ID: Kenneth [name], male    DOB: 1960/11/16  Age: 58 y.o. MRN: [id number]  CC:  Chief Complaint  Patient presents with  . Follow-up    HPI Kenneth [name] presents for follow-up of his depression anxiety and insomnia.  Continues to be well controlled with his Prozac and trazodone.  Continues to take his metformin for his diabetes.  Stye status post eye check this past week.  Dental care is scheduled for next week.  Continues to smoke cigarettes and marijuana when he can.  Sleep study study at home is scheduled soon per cardiology.  Urine flow is good he tells me.  Denies nocturia pre-or post void dribbling.  Foot care is scheduled with the podiatrist next week.  Things are okay at home Past Medical History:  Diagnosis Date  . Arthritis   . Asthma   . Atrial fibrillation ([place])   . Depression   . Diabetes mellitus without complication ([place])   . Emphysema of lung ([place])   . Heart murmur   . Hyperlipidemia   . Hypertension     History reviewed. No pertinent surgical history.  Family History  Problem Relation Age of Onset  . Diabetes Sister   . Coronary artery disease Brother     Social History   Socioeconomic History  . Marital status: Legally Separated    Spouse name: Not on file  . Number of children: Not on file  . Years of education: Not on file  . Highest education level: Not on file  Occupational History  . Not on file  Social Needs  . Financial resource strain: Not on file  . Food insecurity    Worry: Not on file    Inability: Not on file  . Transportation needs    Medical: Not on file    Non-medical: Not on file  Tobacco Use  . Smoking status: Current Every Day Smoker    Packs/day: 0.50    Types: Cigarettes  . Smokeless tobacco: Never Used  Substance and Sexual Activity  . Alcohol use: Yes    Comment: Drinks up to six beers around a game  . Drug use: Yes    Types: Marijuana    Comment: occ marijuana   . Sexual activity: Yes    Partners: Female  Lifestyle  . Physical activity    Days per week: Not on file    Minutes per session: Not on file  . Stress: Not on file  Relationships  . Social Herbalist on phone: Not on file    Gets together: Not on file    Attends religious service: Not on file    Active member of club or organization: Not on file    Attends meetings of clubs or organizations: Not on file    Relationship status: Not on file  . Intimate partner violence    Fear of current or ex partner: Not on file    Emotionally abused: Not on file    Physically abused: Not on file    Forced sexual activity: Not on file  Other Topics Concern  . Not on file  Social History Narrative  . Not on file    Outpatient Medications Prior to Visit  Medication Sig Dispense Refill  . albuterol (PROVENTIL HFA;VENTOLIN HFA) 108 (90 Base) MCG/ACT inhaler Inhale 1 puff into the lungs as needed for wheezing.     [name] albuterol (PROVENTIL) (2.5 MG/3ML) 0.083% nebulizer solution  Take 3 mLs (2.5 mg total) by nebulization 5 (five) times daily as needed for wheezing or shortness of breath. 150 mL 3  . amLODipine (NORVASC) 5 MG tablet Take 1 tablet (5 mg total) by mouth daily. 90 tablet 3  . busPIRone (BUSPAR) 15 MG tablet Take 1 tablet (15 mg total) by mouth 2 (two) times daily. 180 tablet 1  . carvedilol (COREG) 6.25 MG tablet Take 1 tablet (6.25 mg total) by mouth 2 (two) times daily. 180 tablet 1  . ELIQUIS 5 MG TABS tablet TAKE 1 TABLET BY MOUTH TWO  TIMES DAILY 180 tablet 2  . famotidine (PEPCID) 20 MG tablet TAKE 1 TABLET BY MOUTH  DAILY 90 tablet 3  . FLUoxetine (PROZAC) 20 MG capsule TAKE 3 CAPSULES BY MOUTH  DAILY 270 capsule 0  . furosemide (LASIX) 20 MG tablet Take 1 tablet (20 mg total) by mouth daily. 90 tablet 3  . losartan (COZAAR) 25 MG tablet TAKE 1 TABLET BY MOUTH  DAILY 90 tablet 0  . metFORMIN (GLUCOPHAGE) 500 MG tablet Take 1 tablet (500 mg total) by mouth 2 (two) times  daily with a meal. 180 tablet 3  . nicotine (NICODERM CQ - DOSED IN MG/24 HR) 7 mg/24hr patch Place 1 patch (7 mg total) onto the skin daily. 28 patch 0  . nitroGLYCERIN (NITROSTAT) 0.4 MG SL tablet DISSOLVE 1 TABLET UNDER THE TONGUE EVERY 5 MINUTES AS  NEEDED FOR CHEST PAIN MAX 3 TABS IN 15 MINUTES, CALL  911 IF PAIN PERSISTS 75 tablet 0  . polyethylene glycol powder (GLYCOLAX/MIRALAX) powder Take 17 g by mouth 2 (two) times daily as needed. 3350 g 1  . traZODone (DESYREL) 50 MG tablet Take 0.5-1 tablets (25-50 mg total) by mouth at bedtime as needed for sleep. 30 tablet 3  . zolpidem (AMBIEN CR) 12.5 MG CR tablet Take 1 tablet (12.5 mg total) by mouth at bedtime as needed for sleep. 15 tablet 0   No facility-administered medications prior to visit.     Allergies  Allergen Reactions  . Lisinopril Swelling  . Tomato Rash    ROS Review of Systems  Constitutional: Negative for diaphoresis, fatigue, fever and unexpected weight change.  HENT: Negative.   Eyes: Negative for photophobia and visual disturbance.  Respiratory: Positive for cough. Negative for chest tightness, shortness of breath and wheezing.   Cardiovascular: Negative for chest pain and palpitations.  Gastrointestinal: Negative.  Negative for anal bleeding and blood in stool.  Endocrine: Negative for polyphagia and polyuria.  Genitourinary: Negative for difficulty urinating, frequency and urgency.  Musculoskeletal: Negative for gait problem and joint swelling.  Skin: Negative for pallor and rash.  Allergic/Immunologic: Negative for immunocompromised state.  Neurological: Negative for light-headedness and headaches.  Hematological: Does not bruise/bleed easily.   Depression screen [facility] 2/9 05/20/2019 11/15/2018 07/20/2018  Decreased Interest 1 1 1   Down, Depressed, Hopeless 1 1 1   PHQ - 2 Score 2 2 2   Altered sleeping 2 3 2   Tired, decreased energy 1 2 1   Change in appetite 1 2 1   Feeling bad or failure about yourself  0 0  0  Trouble concentrating 1 1 2   Moving slowly or fidgety/restless 0 1 1  Suicidal thoughts 0 0 0  PHQ-9 Score 7 11 9       Objective:    Physical Exam  Constitutional: He is oriented to person, place, and time. He appears well-developed and well-nourished. No distress.  HENT:  Head: Normocephalic and atraumatic.  Right Ear: External ear normal.  Left Ear: External ear normal.  Mouth/Throat: Oropharynx is clear and moist. Abnormal dentition. No oropharyngeal exudate.  Eyes: Pupils are equal, round, and reactive to light. Conjunctivae are normal. Right eye exhibits no discharge. Left eye exhibits no discharge. No scleral icterus.  Neck: No JVD present. No tracheal deviation present. No thyromegaly present.  Cardiovascular: Normal rate, regular rhythm and normal heart sounds.  Pulmonary/Chest: No stridor. No respiratory distress. He has decreased breath sounds. He has no wheezes. He has no rhonchi. He has no rales.  Abdominal: Bowel sounds are normal.  Musculoskeletal:        General: No edema.  Lymphadenopathy:    He has no cervical adenopathy.  Neurological: He is alert and oriented to person, place, and time.  Skin: Skin is warm and dry. He is not diaphoretic.  Psychiatric: He has a normal mood and affect. His behavior is normal.    BP 120/70   Pulse 79   Ht 6' (1.829 m)   Wt 161 lb 6 oz (73.2 kg)   SpO2 99%   BMI 21.89 kg/m  Wt Readings from Last 3 Encounters:  05/20/19 161 lb 6 oz (73.2 kg)  04/22/19 157 lb (71.2 kg)  03/29/19 157 lb (71.2 kg)   BP Readings from Last 3 Encounters:  05/20/19 120/70  11/15/18 128/80  10/21/18 138/76   Guideline developer:  UpToDate (see UpToDate for funding source) Date Released: June 2014  Health Maintenance Due  Topic Date Due  . FOOT EXAM  07/12/1971  . HEMOGLOBIN A1C  01/05/2019    There are no preventive care reminders to display for this patient.  Lab Results  Component Value Date   TSH 4.32 07/06/2018   Lab Results   Component Value Date   WBC 5.8 08/11/2018   HGB 13.9 08/11/2018   HCT 45.2 08/11/2018   MCV 93.6 08/11/2018   PLT 244 08/11/2018   Lab Results  Component Value Date   NA 136 10/28/2018   K 4.3 10/28/2018   CO2 30 (H) 10/28/2018   GLUCOSE 93 10/28/2018   BUN 15 10/28/2018   CREATININE 1.61 (H) 10/28/2018   BILITOT 0.3 07/06/2018   ALKPHOS 67 07/06/2018   AST 16 07/06/2018   ALT 16 07/06/2018   PROT 7.1 07/06/2018   ALBUMIN 4.2 07/06/2018   CALCIUM 10.0 10/28/2018   ANIONGAP 10 08/11/2018   GFR 78.72 08/10/2018   Lab Results  Component Value Date   CHOL 226 (H) 07/06/2018   Lab Results  Component Value Date   HDL 68.40 07/06/2018   Lab Results  Component Value Date   LDLCALC 119 (H) 07/06/2018   Lab Results  Component Value Date   TRIG 190.0 (H) 07/06/2018   Lab Results  Component Value Date   CHOLHDL 3 07/06/2018   Lab Results  Component Value Date   HGBA1C 6.1 07/06/2018      Assessment & Plan:   Problem List Items Addressed This Visit      Respiratory   Chronic obstructive pulmonary disease ([place])     Endocrine   Controlled type 2 diabetes mellitus without complication, without long-term current use of insulin ([facility])   Relevant Orders   Hemoglobin A1c   Comprehensive metabolic panel   Urinalysis, Routine w reflex microscopic     Genitourinary   Mild renal insufficiency   Relevant Orders   Comprehensive metabolic panel     Other   Depression with anxiety   Healthcare maintenance  Relevant Orders   PSA   Alcohol abuse   Relevant Orders   CBC   Comprehensive metabolic panel   Gamma GT   Tobacco abuse   Insomnia due to other mental disorder    Other Visit Diagnoses    Need for influenza vaccination    -  Primary   Relevant Orders   Flu Vaccine QUAD 36+ mos IM (Completed)      Meds ordered this encounter  Medications  . Lancets (ONETOUCH ULTRASOFT) lancets    Sig: Use to test blood sugars 1-2 times daily.    Dispense:  100  each    Refill:  12  . Blood Glucose Monitoring Suppl (ONE TOUCH ULTRA 2) w/Device KIT    Sig: Use to test blood sugars 1-2 times daily.    Dispense:  1 kit    Refill:  0  . glucose blood (ONETOUCH ULTRA) test strip    Sig: Use to test blood sugars 1-2 times daily.    Dispense:  100 each    Refill:  12    Follow-up: Return in about 6 months (around 11/20/2019).     Assessment: Progress toward smoking cessation:    Barriers to progress toward smoking cessation:    Comments: Asked pt to quit again.   Plan: Instruction/counseling given:  I counseled patient on the dangers of tobacco use, advised patient to stop smoking, and reviewed strategies to maximize success. Educational resources provided:    Self management tools provided:    Medications to assist with smoking cessation:  steps to quit smoking.  Patient agreed to the following self-care plans for smoking cessation:    Other plans:

## 2019-05-24 ENCOUNTER — Other Ambulatory Visit: Payer: Self-pay | Admitting: Family Medicine

## 2019-05-24 DIAGNOSIS — I10 Essential (primary) hypertension: Secondary | ICD-10-CM

## 2019-05-26 ENCOUNTER — Other Ambulatory Visit: Payer: Self-pay | Admitting: Family Medicine

## 2019-05-26 DIAGNOSIS — F5105 Insomnia due to other mental disorder: Secondary | ICD-10-CM

## 2019-05-26 DIAGNOSIS — F418 Other specified anxiety disorders: Secondary | ICD-10-CM

## 2019-05-26 DIAGNOSIS — F99 Mental disorder, not otherwise specified: Secondary | ICD-10-CM

## 2019-05-26 DIAGNOSIS — E119 Type 2 diabetes mellitus without complications: Secondary | ICD-10-CM

## 2019-05-26 DIAGNOSIS — I10 Essential (primary) hypertension: Secondary | ICD-10-CM

## 2019-06-06 ENCOUNTER — Telehealth: Payer: Self-pay | Admitting: Family Medicine

## 2019-06-06 DIAGNOSIS — I10 Essential (primary) hypertension: Secondary | ICD-10-CM

## 2019-06-06 DIAGNOSIS — I480 Paroxysmal atrial fibrillation: Secondary | ICD-10-CM

## 2019-06-06 MED ORDER — CARVEDILOL 6.25 MG PO TABS: 12.5000 mg | ORAL_TABLET | Freq: Two times a day (BID) | ORAL | 0 refills | Status: DC

## 2019-06-06 NOTE — Telephone Encounter (Signed)
I spoke with pt. He is taking 12.5 mg twice daily, Rx corrected & resent in.

## 2019-06-06 NOTE — Telephone Encounter (Signed)
Patient stated his cardiologist changed the dosage on his carvedilol (COREG) 6.25 MG tablet. He received his medication from  [facility], [place] [phone] (Phone) [phone] (Fax)   He stated he doesn't have enough pill for the month due to this change. He would like a call back please

## 2019-06-08 ENCOUNTER — Telehealth: Payer: Self-pay | Admitting: Cardiovascular Disease

## 2019-06-08 NOTE — Telephone Encounter (Signed)
LVM for patient to call and schedule 3 month sleep clinic appointment with Dr. Claiborne Billings.

## 2019-06-12 ENCOUNTER — Other Ambulatory Visit: Payer: Self-pay

## 2019-06-12 ENCOUNTER — Encounter (HOSPITAL_COMMUNITY): Payer: Self-pay

## 2019-06-12 ENCOUNTER — Observation Stay (HOSPITAL_COMMUNITY)
Admission: EM | Admit: 2019-06-12 | Discharge: 2019-06-13 | Disposition: A | Discharge status: Home / Self Care | Payer: Medicare Other | Attending: Internal Medicine | Admitting: Internal Medicine

## 2019-06-12 ENCOUNTER — Emergency Department (HOSPITAL_COMMUNITY): Payer: Medicare Other

## 2019-06-12 DIAGNOSIS — Z888 Allergy status to other drugs, medicaments and biological substances status: Secondary | ICD-10-CM | POA: Insufficient documentation

## 2019-06-12 DIAGNOSIS — R0789 Other chest pain: Secondary | ICD-10-CM

## 2019-06-12 DIAGNOSIS — R0602 Shortness of breath: Secondary | ICD-10-CM | POA: Diagnosis present

## 2019-06-12 DIAGNOSIS — I5042 Chronic combined systolic (congestive) and diastolic (congestive) heart failure: Secondary | ICD-10-CM | POA: Diagnosis not present

## 2019-06-12 DIAGNOSIS — J441 Chronic obstructive pulmonary disease with (acute) exacerbation: Principal | ICD-10-CM | POA: Insufficient documentation

## 2019-06-12 DIAGNOSIS — F418 Other specified anxiety disorders: Secondary | ICD-10-CM | POA: Diagnosis present

## 2019-06-12 DIAGNOSIS — I110 Hypertensive heart disease with heart failure: Secondary | ICD-10-CM | POA: Diagnosis not present

## 2019-06-12 DIAGNOSIS — E785 Hyperlipidemia, unspecified: Secondary | ICD-10-CM | POA: Diagnosis not present

## 2019-06-12 DIAGNOSIS — Z79899 Other long term (current) drug therapy: Secondary | ICD-10-CM | POA: Diagnosis not present

## 2019-06-12 DIAGNOSIS — Z7984 Long term (current) use of oral hypoglycemic drugs: Secondary | ICD-10-CM | POA: Insufficient documentation

## 2019-06-12 DIAGNOSIS — J449 Chronic obstructive pulmonary disease, unspecified: Secondary | ICD-10-CM | POA: Diagnosis present

## 2019-06-12 DIAGNOSIS — Z7901 Long term (current) use of anticoagulants: Secondary | ICD-10-CM

## 2019-06-12 DIAGNOSIS — Z20828 Contact with and (suspected) exposure to other viral communicable diseases: Secondary | ICD-10-CM | POA: Diagnosis not present

## 2019-06-12 DIAGNOSIS — R079 Chest pain, unspecified: Secondary | ICD-10-CM | POA: Diagnosis present

## 2019-06-12 DIAGNOSIS — I10 Essential (primary) hypertension: Secondary | ICD-10-CM

## 2019-06-12 DIAGNOSIS — I480 Paroxysmal atrial fibrillation: Secondary | ICD-10-CM | POA: Diagnosis not present

## 2019-06-12 DIAGNOSIS — K219 Gastro-esophageal reflux disease without esophagitis: Secondary | ICD-10-CM | POA: Diagnosis present

## 2019-06-12 DIAGNOSIS — Z720 Tobacco use: Secondary | ICD-10-CM

## 2019-06-12 DIAGNOSIS — I5023 Acute on chronic systolic (congestive) heart failure: Secondary | ICD-10-CM | POA: Diagnosis present

## 2019-06-12 DIAGNOSIS — E1159 Type 2 diabetes mellitus with other circulatory complications: Secondary | ICD-10-CM | POA: Diagnosis present

## 2019-06-12 DIAGNOSIS — F1010 Alcohol abuse, uncomplicated: Secondary | ICD-10-CM | POA: Diagnosis not present

## 2019-06-12 DIAGNOSIS — E119 Type 2 diabetes mellitus without complications: Secondary | ICD-10-CM

## 2019-06-12 DIAGNOSIS — F17210 Nicotine dependence, cigarettes, uncomplicated: Secondary | ICD-10-CM | POA: Insufficient documentation

## 2019-06-12 DIAGNOSIS — F329 Major depressive disorder, single episode, unspecified: Secondary | ICD-10-CM | POA: Insufficient documentation

## 2019-06-12 DIAGNOSIS — I152 Hypertension secondary to endocrine disorders: Secondary | ICD-10-CM | POA: Diagnosis present

## 2019-06-12 HISTORY — DX: Other chest pain: R07.89

## 2019-06-12 HISTORY — DX: Heart failure, unspecified: I50.9

## 2019-06-12 LAB — COMPREHENSIVE METABOLIC PANEL
ALT: 20 U/L (ref 0–44)
AST: 18 U/L (ref 15–41)
Albumin: 3.8 g/dL (ref 3.5–5.0)
Alkaline Phosphatase: 57 U/L (ref 38–126)
Anion gap: 9 (ref 5–15)
BUN: 6 mg/dL (ref 6–20)
CO2: 28 mmol/L (ref 22–32)
Calcium: 8.9 mg/dL (ref 8.9–10.3)
Chloride: 98 mmol/L (ref 98–111)
Creatinine, Ser: 1.12 mg/dL (ref 0.61–1.24)
GFR calc Af Amer: >60 mL/min (ref >60)
GFR calc non Af Amer: >60 mL/min (ref >60)
Glucose, Bld: 135 mg/dL — ABNORMAL HIGH (ref 70–99)
Potassium: 3.6 mmol/L (ref 3.5–5.1)
Sodium: 135 mmol/L (ref 135–145)
Total Bilirubin: 0.6 mg/dL (ref 0.3–1.2)
Total Protein: 6.9 g/dL (ref 6.5–8.1)

## 2019-06-12 LAB — CBC WITH DIFFERENTIAL/PLATELET
Abs Immature Granulocytes: 0.02 10*3/uL (ref 0.00–0.07)
Basophils Absolute: 0 10*3/uL (ref 0.0–0.1)
Basophils Relative: 1 %
Eosinophils Absolute: 0.1 10*3/uL (ref 0.0–0.5)
Eosinophils Relative: 2 %
HCT: 41.1 % (ref 39.0–52.0)
Hemoglobin: 13.2 g/dL (ref 13.0–17.0)
Immature Granulocytes: 0 %
Lymphocytes Relative: 15 %
Lymphs Abs: 0.9 10*3/uL (ref 0.7–4.0)
MCH: 31 pg (ref 26.0–34.0)
MCHC: 32.1 g/dL (ref 30.0–36.0)
MCV: 96.5 fL (ref 80.0–100.0)
Monocytes Absolute: 0.6 10*3/uL (ref 0.1–1.0)
Monocytes Relative: 10 %
Neutro Abs: 4.3 10*3/uL (ref 1.7–7.7)
Neutrophils Relative %: 72 %
Platelets: 261 10*3/uL (ref 150–400)
RBC: 4.26 MIL/uL (ref 4.22–5.81)
RDW: 12.5 % (ref 11.5–15.5)
WBC: 6.1 10*3/uL (ref 4.0–10.5)
nRBC: 0 % (ref 0.0–0.2)

## 2019-06-12 LAB — BLOOD GAS, VENOUS
Acid-base deficit: 1 mmol/L (ref 0.0–2.0)
Bicarbonate: 24 mmol/L (ref 20.0–28.0)
O2 Saturation: 42.6 %
Patient temperature: 98.1
pCO2, Ven: 43.3 mmHg — ABNORMAL LOW (ref 44.0–60.0)
pH, Ven: 7.361 (ref 7.250–7.430)

## 2019-06-12 LAB — LACTIC ACID, PLASMA
Lactic Acid, Venous: 2.1 mmol/L (ref 0.5–1.9)
Lactic Acid, Venous: 2 mmol/L (ref 0.5–1.9)

## 2019-06-12 LAB — BRAIN NATRIURETIC PEPTIDE: B Natriuretic Peptide: 784 pg/mL — ABNORMAL HIGH (ref 0.0–100.0)

## 2019-06-12 LAB — TROPONIN I (HIGH SENSITIVITY)
Troponin I (High Sensitivity): 8 ng/L (ref <18)
Troponin I (High Sensitivity): 11 ng/L (ref <18)

## 2019-06-12 MED ORDER — SENNOSIDES-DOCUSATE SODIUM 8.6-50 MG PO TABS: 1.0000 | ORAL_TABLET | Freq: Every evening | ORAL | Status: DC | PRN

## 2019-06-12 MED ORDER — IPRATROPIUM-ALBUTEROL 0.5-2.5 (3) MG/3ML IN SOLN
3.0000 mL | Freq: Four times a day (QID) | RESPIRATORY_TRACT | Status: DC
  Administered 2019-06-12: 3 mL via RESPIRATORY_TRACT

## 2019-06-12 MED ORDER — ONDANSETRON HCL 4 MG/2ML IJ SOLN: 4.0000 mg | Freq: Four times a day (QID) | INTRAMUSCULAR | Status: DC | PRN

## 2019-06-12 MED ORDER — IPRATROPIUM-ALBUTEROL 0.5-2.5 (3) MG/3ML IN SOLN
3.0000 mL | Freq: Three times a day (TID) | RESPIRATORY_TRACT | Status: DC
  Administered 2019-06-13 (×2): 3 mL via RESPIRATORY_TRACT
  Filled 2019-06-12 (×2): qty 3

## 2019-06-12 MED ORDER — FLUOXETINE HCL 20 MG PO CAPS
60.0000 mg | ORAL_CAPSULE | Freq: Every day | ORAL | Status: DC
  Administered 2019-06-13: 60 mg via ORAL
  Filled 2019-06-12: qty 3

## 2019-06-12 MED ORDER — IPRATROPIUM-ALBUTEROL 0.5-2.5 (3) MG/3ML IN SOLN
3.0000 mL | Freq: Three times a day (TID) | RESPIRATORY_TRACT | Status: DC | PRN
  Administered 2019-06-12: 3 mL via RESPIRATORY_TRACT
  Filled 2019-06-12: qty 9

## 2019-06-12 MED ORDER — BUSPIRONE HCL 5 MG PO TABS
15.0000 mg | ORAL_TABLET | Freq: Two times a day (BID) | ORAL | Status: DC
  Administered 2019-06-12 – 2019-06-13 (×2): 15 mg via ORAL
  Filled 2019-06-12 (×2): qty 1

## 2019-06-12 MED ORDER — TRAZODONE HCL 50 MG PO TABS
25.0000 mg | ORAL_TABLET | Freq: Every evening | ORAL | Status: DC | PRN
  Administered 2019-06-12: 50 mg via ORAL
  Filled 2019-06-12: qty 1

## 2019-06-12 MED ORDER — HYDRALAZINE HCL 20 MG/ML IJ SOLN
5.0000 mg | Freq: Four times a day (QID) | INTRAMUSCULAR | Status: DC | PRN
  Administered 2019-06-12: 5 mg via INTRAVENOUS
  Filled 2019-06-12: qty 1

## 2019-06-12 MED ORDER — NICOTINE 7 MG/24HR TD PT24
7.0000 mg | MEDICATED_PATCH | Freq: Every day | TRANSDERMAL | Status: DC
  Administered 2019-06-13: 7 mg via TRANSDERMAL
  Filled 2019-06-12: qty 1

## 2019-06-12 MED ORDER — FAMOTIDINE 20 MG PO TABS
20.0000 mg | ORAL_TABLET | Freq: Every day | ORAL | Status: DC
  Administered 2019-06-13: 20 mg via ORAL
  Filled 2019-06-12: qty 1

## 2019-06-12 MED ORDER — ACETAMINOPHEN 325 MG PO TABS
650.0000 mg | ORAL_TABLET | Freq: Four times a day (QID) | ORAL | Status: DC | PRN
  Administered 2019-06-12: 650 mg via ORAL
  Filled 2019-06-12: qty 2

## 2019-06-12 MED ORDER — AMLODIPINE BESYLATE 5 MG PO TABS
5.0000 mg | ORAL_TABLET | Freq: Every day | ORAL | Status: DC
  Administered 2019-06-13: 5 mg via ORAL
  Filled 2019-06-12: qty 1

## 2019-06-12 MED ORDER — ONDANSETRON HCL 4 MG PO TABS: 4.0000 mg | ORAL_TABLET | Freq: Four times a day (QID) | ORAL | Status: DC | PRN

## 2019-06-12 MED ORDER — ALBUTEROL SULFATE (2.5 MG/3ML) 0.083% IN NEBU
2.5000 mg | INHALATION_SOLUTION | RESPIRATORY_TRACT | Status: DC | PRN
  Administered 2019-06-13: 2.5 mg via RESPIRATORY_TRACT
  Filled 2019-06-12: qty 3

## 2019-06-12 MED ORDER — ALBUTEROL SULFATE HFA 108 (90 BASE) MCG/ACT IN AERS: 1.0000 | INHALATION_SPRAY | RESPIRATORY_TRACT | Status: DC | PRN

## 2019-06-12 MED ORDER — ALBUTEROL SULFATE (2.5 MG/3ML) 0.083% IN NEBU: 2.5000 mg | INHALATION_SOLUTION | Freq: Every day | RESPIRATORY_TRACT | Status: DC | PRN

## 2019-06-12 MED ORDER — APIXABAN 5 MG PO TABS
5.0000 mg | ORAL_TABLET | Freq: Two times a day (BID) | ORAL | Status: DC
  Administered 2019-06-12 – 2019-06-13 (×2): 5 mg via ORAL
  Filled 2019-06-12: qty 1
  Filled 2019-06-12: qty 2

## 2019-06-12 MED ORDER — PREDNISONE 20 MG PO TABS: 40.0000 mg | ORAL_TABLET | Freq: Every day | ORAL | Status: DC

## 2019-06-12 MED ORDER — METHYLPREDNISOLONE SODIUM SUCC 125 MG IJ SOLR
60.0000 mg | Freq: Two times a day (BID) | INTRAMUSCULAR | Status: AC
  Administered 2019-06-12 – 2019-06-13 (×2): 60 mg via INTRAVENOUS
  Filled 2019-06-12 (×2): qty 2

## 2019-06-12 MED ORDER — INSULIN ASPART 100 UNIT/ML ~~LOC~~ SOLN
0.0000 [IU] | Freq: Three times a day (TID) | SUBCUTANEOUS | Status: DC
  Administered 2019-06-13: 1 [IU] via SUBCUTANEOUS
  Administered 2019-06-13: 3 [IU] via SUBCUTANEOUS

## 2019-06-12 MED ORDER — CARVEDILOL 12.5 MG PO TABS
12.5000 mg | ORAL_TABLET | Freq: Two times a day (BID) | ORAL | Status: DC
  Administered 2019-06-12 – 2019-06-13 (×2): 12.5 mg via ORAL
  Filled 2019-06-12 (×2): qty 1

## 2019-06-12 MED ORDER — IPRATROPIUM-ALBUTEROL 0.5-2.5 (3) MG/3ML IN SOLN
RESPIRATORY_TRACT | Status: AC
  Filled 2019-06-12: qty 3

## 2019-06-12 MED ORDER — LOSARTAN POTASSIUM 25 MG PO TABS
25.0000 mg | ORAL_TABLET | Freq: Every day | ORAL | Status: DC
  Administered 2019-06-13: 25 mg via ORAL
  Filled 2019-06-12: qty 1

## 2019-06-12 MED ORDER — NITROGLYCERIN 0.4 MG SL SUBL: 0.4000 mg | SUBLINGUAL_TABLET | SUBLINGUAL | Status: DC | PRN

## 2019-06-12 MED ORDER — ACETAMINOPHEN 650 MG RE SUPP: 650.0000 mg | Freq: Four times a day (QID) | RECTAL | Status: DC | PRN

## 2019-06-12 MED ORDER — FUROSEMIDE 20 MG PO TABS
20.0000 mg | ORAL_TABLET | Freq: Every day | ORAL | Status: DC
  Administered 2019-06-13: 20 mg via ORAL
  Filled 2019-06-12: qty 1

## 2019-06-12 NOTE — H&P (Signed)
[facility] History and Physical  Kenneth [name] KLK:[id number] DOB: Mar 19, 1961 DOA: 06/12/2019  Referring physician: Octaviano [name] PCP: Kenneth Maw, MD   Chief Complaint: worsening shortness of breath  HPI: Kenneth [name] is a 58 y.o. male with medical history significant for combined CHF with preserved EF and grade 2 diastolic dysfunction, paroxysmal atrial fibrillation on Eliquis, HTN, type 2 diabetes, COPD who presents on 06/12/2019 with progressive dyspnea over the last few days with season change but acutely worsened in the past 24 hours.   With change in weather, he has noticed increased SOB with exertion while moving in house.  At baseline he gets short of breath doing household chores like vacuuming.  Today his shortness of breath worsened after using the bathroom with only minimal improvement with his albuterol inhaler. Felt like he was gasping for air and had his friend call EMS; while waiting he took a nitro which he states helped the shortness of breath and "tightness from his breathing". Started taking a cold/decongestant a few days ago as well but cannot member the name  Denies any frequent inhaler use Has chronic non-productive cough that hasn't changed Denies fevers, chills, changes in medications.  COPD.   Not very active physically at baseline.  Activities such as vacuuming two bedrooms and the hallway will get him short of breath and require use either his albuterol inhaler or nebulizer  Tobacco use Has cut back from 2ppd to 2-3 cigarettes daily.   HTN. Takes his amlodipine and carvedilol. Intermittently checks his BP at home, typically 130s-140s/ 90s  Combined CHF Takes lasix 20 mg daily. No weight gain, lower leg swelling, denies orthopnea/PND. Actually noticing weight loss about 5-10 lbs in about a month due low appetite, no N/v, no abdominal pain  T2DM Takes metformin.  CBG usually in the 120s  AF Takes carvedilol 12.5 mg BID ( changed about 4-5  months ago reports) and eliquis. No bleeding. No palpitations or chest pain  Alcohol abuse At most 12pack beer a week. This past week about 1/2 pint of whiskey in 3-5 days. No history of alcohol withdrawal.  Has been to rehab before  Depression/anxiety. Takes prozac and buspar    ED Course:   T-max 97.6 RR 34  SPO2 89% BP 179/93 COVID tests pending  Troponin high-sensitivity 8--11.  VBG: CO2 43, lactic acid 2.1--2  Sodium 135, potassium 3.6, glucose 135, creatinine 1.12 WBC 6.1, hemoglobin 13.2, platelets 261  BNP 784  Chest x-ray no evidence of cardiopulmonary disease  Patient was given DuoNeb in ED.  [facility] was called for further management   Review of Systems:  Constitutional:  No weight loss, night sweats, Fevers, chills, fatigue.  HEENT:  No headaches, Difficulty swallowing,Tooth/dental problems,Sore throat,  No sneezing, itching, ear ache, nasal congestion, post nasal drip,  Cardio-vascular:  No chest pain, Orthopnea, PND, swelling in lower extremities, anasarca, dizziness, palpitations  GI:  No heartburn, indigestion, abdominal pain, nausea, vomiting, diarrhea, change in bowel habits, loss of appetite  Resp:  shortness of breath with exertion or at rest. No excess mucus, no productive cough, non-productive cough, No coughing up of blood.No change in color of mucus.No wheezing.No chest wall deformity  Skin:  no rash or lesions.  GU:  no dysuria, change in color of urine, no urgency or frequency. No flank pain.  Musculoskeletal:  No joint pain or swelling. No decreased range of motion. No back pain.  Psych:  No change in mood or affect. No depression or anxiety. No  memory loss.   Past Medical History:  Diagnosis Date  . Arthritis   . Asthma   . Atrial fibrillation ([place])   . CHF (congestive heart failure) ([place])   . Depression   . Diabetes mellitus without complication ([place])   . Emphysema of lung ([place])   . Heart murmur   . Hyperlipidemia    . Hypertension    History reviewed. No pertinent surgical history. Social History:  reports that he has been smoking cigarettes. He has been smoking about 0.50 packs per day. He has never used smokeless tobacco. He reports current alcohol use. He reports current drug use. Drug: Marijuana.  Allergies  Allergen Reactions  . Lisinopril Swelling  . Tomato Rash    Family History  Problem Relation Age of Onset  . Diabetes Sister   . Coronary artery disease Brother      Prior to Admission medications   Medication Sig Start Date End Date Taking? Authorizing Provider  albuterol (PROVENTIL HFA;VENTOLIN HFA) 108 (90 Base) MCG/ACT inhaler Inhale 1-2 puffs into the lungs every 4 (four) hours as needed for wheezing or shortness of breath.  06/19/16  Yes [provider]  albuterol (PROVENTIL) (2.5 MG/3ML) 0.083% nebulizer solution Take 3 mLs (2.5 mg total) by nebulization 5 (five) times daily as needed for wheezing or shortness of breath. 08/04/18  Yes Kenneth Maw, MD  amLODipine (NORVASC) 5 MG tablet TAKE 1 TABLET BY MOUTH  DAILY Patient taking differently: Take 5 mg by mouth daily.  05/25/19  Yes Kenneth Maw, MD  Aspirin-Salicylamide-Caffeine [facility] HEADACHE POWDER PO) Take 1 packet by mouth every 6 (six) hours as needed (headache, pain).   Yes [provider]  Blood Glucose Monitoring Suppl (ONE TOUCH ULTRA 2) w/Device KIT Use to test blood sugars 1-2 times daily. 05/20/19  Yes Kenneth Maw, MD  busPIRone (BUSPAR) 15 MG tablet TAKE 1 TABLET BY MOUTH TWO  TIMES DAILY Patient taking differently: Take 15 mg by mouth 2 (two) times daily.  05/26/19  Yes Kenneth Maw, MD  carvedilol (COREG) 6.25 MG tablet Take 2 tablets (12.5 mg total) by mouth 2 (two) times daily. 06/06/19  Yes Kenneth Maw, MD  ELIQUIS 5 MG TABS tablet TAKE 1 TABLET BY MOUTH TWO  TIMES DAILY Patient taking differently: Take 5 mg by mouth 2 (two) times daily.  03/28/19  Yes  Buford Dresser, MD  famotidine (PEPCID) 20 MG tablet TAKE 1 TABLET BY MOUTH  DAILY Patient taking differently: Take 20 mg by mouth daily.  05/04/19  Yes Kenneth Maw, MD  FLUoxetine (PROZAC) 20 MG capsule TAKE 3 CAPSULES BY MOUTH  DAILY Patient taking differently: Take 60 mg by mouth daily.  05/26/19  Yes Kenneth Maw, MD  furosemide (LASIX) 20 MG tablet TAKE 1 TABLET BY MOUTH  DAILY Patient taking differently: Take 20 mg by mouth daily.  05/26/19  Yes Kenneth Maw, MD  glucose blood [facility] ULTRA) test strip Use to test blood sugars 1-2 times daily. 05/20/19  Yes Kenneth Maw, MD  Lancets [facility] ULTRASOFT) lancets Use to test blood sugars 1-2 times daily. 05/20/19  Yes Kenneth Maw, MD  losartan (COZAAR) 25 MG tablet TAKE 1 TABLET BY MOUTH  DAILY 05/12/19  Yes Buford Dresser, MD  metFORMIN (GLUCOPHAGE) 500 MG tablet TAKE 1 TABLET BY MOUTH TWO  TIMES DAILY WITH MEALS Patient taking differently: Take 500 mg by mouth 2 (two) times daily with a meal.  05/26/19  Yes Abelino Derrick  Delrae Alfred, MD  nitroGLYCERIN (NITROSTAT) 0.4 MG SL tablet DISSOLVE 1 TABLET UNDER THE TONGUE EVERY 5 MINUTES AS  NEEDED FOR CHEST PAIN MAX 3 TABS IN 15 MINUTES, CALL  911 IF PAIN PERSISTS Patient taking differently: Place 0.4 mg under the tongue every 5 (five) minutes as needed for chest pain.  05/05/19  Yes Kenneth Maw, MD  traZODone (DESYREL) 50 MG tablet TAKE 1/2 TO 1 TABLET BY  MOUTH AT BEDTIME AS NEEDED  FOR SLEEP. Patient taking differently: Take 25-50 mg by mouth at bedtime as needed for sleep.  05/26/19  Yes Kenneth Maw, MD  nicotine (NICODERM CQ - DOSED IN MG/24 HR) 7 mg/24hr patch Place 1 patch (7 mg total) onto the skin daily. Patient not taking: Reported on 06/12/2019 07/31/18   Kayleen Memos, DO   Physical Exam: Vitals:   06/12/19 1545 06/12/19 1559 06/12/19 1630 06/12/19 1730  BP:  (!) 150/68 (!) 148/70 (!) 179/93  Pulse: 81 80 81  87  Resp: 15 17 15 20   Temp:      TempSrc:      SpO2: 96% 97% 96% 97%    Wt Readings from Last 3 Encounters:  05/20/19 73.2 kg  04/22/19 71.2 kg  03/29/19 71.2 kg    Constitutional normal appearing African-American male, no acute distress Eyes: EOMI, anicteric, normal conjunctivae ENMT: Oropharynx with moist mucous membranes, normal dentition Neck: FROM,  Cardiovascular: RRR no MRGs, with no peripheral edema Respiratory: Normal respiratory effort on room air, wheezing present, diminished breath sounds throughout, no conversational dyspnea, no increased work of breathing, no accessory muscle usage  Abdomen: Soft,non-tender,  Musculoskeletal: no clubbing / cyanosis. No joint deformity upper and lower extremities. Good ROM, no contractures. Normal muscle tone. Skin: No rash ulcers, or lesions. Without skin tenting  Neurologic: Grossly no focal neuro deficit. Psychiatric:Appropriate affect, and mood. Mental status AAOx3          Labs on Admission:  Basic Metabolic Panel: Recent Labs  Lab 06/12/19 1125  NA 135  K 3.6  CL 98  CO2 28  GLUCOSE 135*  BUN 6  CREATININE 1.12  CALCIUM 8.9   Liver Function Tests: Recent Labs  Lab 06/12/19 1125  AST 18  ALT 20  ALKPHOS 57  BILITOT 0.6  PROT 6.9  ALBUMIN 3.8   No results for input(s): LIPASE, AMYLASE in the last 168 hours. No results for input(s): AMMONIA in the last 168 hours. CBC: Recent Labs  Lab 06/12/19 1125  WBC 6.1  NEUTROABS 4.3  HGB 13.2  HCT 41.1  MCV 96.5  PLT 261   Cardiac Enzymes: No results for input(s): CKTOTAL, CKMB, CKMBINDEX, TROPONINI in the last 168 hours.  BNP (last 3 results) Recent Labs    07/28/18 0913 06/12/19 1125  BNP 1,379.1* 784.0*    ProBNP (last 3 results) No results for input(s): PROBNP in the last 8760 hours.  CBG: No results for input(s): GLUCAP in the last 168 hours.  Radiological Exams on Admission: Dg Chest Portable 1 View  Result Date: 06/12/2019 CLINICAL  DATA:  Shortness of breath EXAM: PORTABLE CHEST 1 VIEW COMPARISON:  None. FINDINGS: Lungs are clear.  No pleural effusion or pneumothorax. The heart is normal in size. Thoracic aortic atherosclerosis. IMPRESSION: No evidence of acute cardiopulmonary disease. Thoracic aortic atherosclerosis. Electronically Signed   By: Julian Hy M.D.   On: 06/12/2019 12:47    EKG: Independently reviewed.  T wave inversions in V 4 through V6, and lead II, lead  III, aVF (previous EKG showed to emergency and V5, V6, aVL)  Assessment/Plan Active Problems:   Essential hypertension   Chronic obstructive pulmonary disease (HCC)   Controlled type 2 diabetes mellitus without complication, without long-term current use of insulin (HCC)   Depression with anxiety   Alcohol abuse   PAF (paroxysmal atrial fibrillation) (HCC)   Chronic combined systolic and diastolic heart failure (HCC)   Gastroesophageal reflux disease without esophagitis   Tobacco abuse   Anticoagulant long-term use   Shortness of breath   Chest tightness   Worsening dyspnea at rest and with exertion.  Seems consistent with COPD flare given wheezing, brief hypoxia, worsening dyspnea with seasonal change consistent with typical flare.  Continue scheduled duo nebs.  Not currently requiring O2, IV Solu-Medrol twice daily and transition to oral oral prednisone.  Chest tightness, stable.  Differential includes COPD exacerbation, ACS. Still suspect may be related to COPD flare as patient reports shortness of breath improved with inhaler regimen here  Resolved with sublingual nitro at home, not recurrent in hospital, high-sensitivity troponin trend negative.  EKG does show some new T wave inversions.  Will repeat EKG.  Obtain TTE to ensure no new wall motion normalities.  Given history of CHF, HTN, type 2 diabetes has multiple risk factors.  Consult cardiology in a.m., EKG as needed chest pain, nitro as needed chest pain.    COPD with acute mild  exacerbation.  Worsening shortness of breath with exertion, no change in cough scheduled inhalers, IV Solu-Medrol twice daily x2 doses, transition to oral prednisone.  Flutter valve, incentive spirometry, scheduled Mucinex, tobacco abuse cessation provided.  Chronic combined systolic and diastolic CHF (EF 24-58% on TTE 05/9832, grade 2 diastolic dysfunction).  No signs of volume overload on exam, BNP 784, chest x-ray unremarkable.  Continue home oral Lasix.  Daily weights, strict I's and O's, fluid strict, low-sodium diet, repeating TTE.  Paroxysmal atrial fibrillation.  Currently in normal sinus rhythm.  Continue home Coreg and Eliquis.  Monitor on telemetry.  Type 2 diabetes, well controlled.  Holding home metformin, monitor CBGs, sliding scale as needed.  Depression with anxiety, stable.  Continue home fluoxetine.  Ethanol abuse.  Admits to drinking half a pint of whiskey with past 3 to 5 days.  Denies any prior history of withdrawals.  Monitor on CIWA protocol.  GERD, stable.  Continue PPI.  HTN.  Elevated in ED, likely related to missing home BP meds.  Continue home amlodipine, Coreg.  IV hydralazine PRN.  Consultants: Cardiology in a.m Code Status: DNR, discussed on day of admission DVT Prophylaxis: Home Eliquis Family Communication: No family at bedside Disposition Plan: Plan observation stay given no O2 requirements, closely monitor for recurrent chest pain, repeat EKG, pending TTE, anticipate discharge next 24 hours pending evaluations.    Desiree Hane MD [facility]  Pager [phone]  If 7PM-7AM, please contact night-coverage [email or web address] Password [facility]  06/12/2019, 6:22 PM

## 2019-06-12 NOTE — ED Provider Notes (Signed)
[facility] DEPT Provider Note   CSN: [id number] Arrival date & time: 06/12/19  1051     History   Chief Complaint Chief Complaint  Patient presents with  . Shortness of Breath    HPI Kenneth [name] is a 58 y.o. male past medical history of COPD (not on home oxygen) hypertension, hyperlipidemia, diabetes, CHF, presented to the emerge department shortness of breath.  Patient reports he woke up in his usual state of health today.  He did his morning routines.  While he was sitting the toilet attempting of a bowel movement began to feel short of breath and described chest tightness.[name]  He gave himself 1 of his albuterol breathing treatments at home but this did not improve his symptoms.  And took a sublingual nitroglycerin did seem to improve his symptoms.  When EMS arrived they also gave him a sublingual nitroglycerin.  Currently in the emergency department he feels well and has no symptoms.  Patient reports that this type of shortness of breath and chest tightness usually occurs with his COPD.  He reports that he tends of COPD flareups when the weather changes in his cooler, as it has today.  He also reports he resumed smoking after a hiatus, and has been smoking 2 to 3 cigarettes/day for the past several weeks.  He denies any leg swelling or weight gain. In the ED he is currently asymptomatic.  He denies any fevers, chills, productive cough, or sick contacts in the house.  Per his records his last echocardiogram was in December 2019, which showed:  Study Conclusions  - Left ventricle: The cavity size was normal. Wall thickness was   normal. Systolic function was moderately to severely reduced. The   estimated ejection fraction was in the range of 30% to 35%.   Diffuse hypokinesis. There is hypokinesis of the inferior and   inferoseptal myocardium. Features are consistent with a   pseudonormal left ventricular filling pattern, with concomitant   abnormal  relaxation and increased filling pressure (grade 2   diastolic dysfunction). - Aortic valve: Trileaflet; moderately thickened, moderately   calcified leaflets. There was mild regurgitation. - Left atrium: The atrium was moderately dilated. - Tricuspid valve: There was trivial regurgitation. - Pulmonary arteries: Systolic pressure was mildly increased. PA   peak pressure: 32 mm Hg (S).    HPI  Past Medical History:  Diagnosis Date  . Arthritis   . Asthma   . Atrial fibrillation ([place])   . CHF (congestive heart failure) ([place])   . Depression   . Diabetes mellitus without complication ([place])   . Emphysema of lung ([place])   . Heart murmur   . Hyperlipidemia   . Hypertension     Patient Active Problem List   Diagnosis Date Noted  . Snoring 03/29/2019  . Insomnia due to other mental disorder 11/15/2018  . Anticoagulant long-term use 10/21/2018  . Gastroesophageal reflux disease without esophagitis 08/10/2018  . Tobacco abuse 08/10/2018  . Chest pain 07/27/2018  . PAF (paroxysmal atrial fibrillation) ([place]) 07/27/2018  . Mild renal insufficiency 07/27/2018  . Chronic combined systolic and diastolic heart failure ([place]) 07/27/2018  . Slow transit constipation 07/20/2018  . Essential hypertension 07/06/2018  . Chronic obstructive pulmonary disease ([place]) 07/06/2018  . Controlled type 2 diabetes mellitus without complication, without long-term current use of insulin ([place]) 07/06/2018  . Depression with anxiety 07/06/2018  . Anemia 07/06/2018  . Healthcare maintenance 07/06/2018  . Alcohol abuse 07/06/2018    History reviewed.  No pertinent surgical history.      Home Medications    Prior to Admission medications   Medication Sig Start Date End Date Taking? Authorizing Provider  albuterol (PROVENTIL HFA;VENTOLIN HFA) 108 (90 Base) MCG/ACT inhaler Inhale 1-2 puffs into the lungs every 4 (four) hours as needed for wheezing or shortness of breath.  06/19/16  Yes [provider]  albuterol (PROVENTIL) (2.5 MG/3ML) 0.083% nebulizer solution Take 3 mLs (2.5 mg total) by nebulization 5 (five) times daily as needed for wheezing or shortness of breath. 08/04/18  Yes [name], MD  amLODipine (NORVASC) 5 MG tablet TAKE 1 TABLET BY MOUTH  DAILY Patient taking differently: Take 5 mg by mouth daily.  05/25/19  Yes [name], MD  Aspirin-Salicylamide-Caffeine [facility] HEADACHE POWDER PO) Take 1 packet by mouth every 6 (six) hours as needed (headache, pain).   Yes [provider]  Blood Glucose Monitoring Suppl (ONE TOUCH ULTRA 2) w/Device KIT Use to test blood sugars 1-2 times daily. 05/20/19  Yes [name], MD  busPIRone (BUSPAR) 15 MG tablet TAKE 1 TABLET BY MOUTH TWO  TIMES DAILY Patient taking differently: Take 15 mg by mouth 2 (two) times daily.  05/26/19  Yes [name], MD  carvedilol (COREG) 6.25 MG tablet Take 2 tablets (12.5 mg total) by mouth 2 (two) times daily. 06/06/19  Yes [name], MD  ELIQUIS 5 MG TABS tablet TAKE 1 TABLET BY MOUTH TWO  TIMES DAILY Patient taking differently: Take 5 mg by mouth 2 (two) times daily.  03/28/19  Yes Buford Dresser, MD  famotidine (PEPCID) 20 MG tablet TAKE 1 TABLET BY MOUTH  DAILY Patient taking differently: Take 20 mg by mouth daily.  05/04/19  Yes [name], MD  FLUoxetine (PROZAC) 20 MG capsule TAKE 3 CAPSULES BY MOUTH  DAILY Patient taking differently: Take 60 mg by mouth daily.  05/26/19  Yes [name], MD  furosemide (LASIX) 20 MG tablet TAKE 1 TABLET BY MOUTH  DAILY Patient taking differently: Take 20 mg by mouth daily.  05/26/19  Yes [name], MD  glucose blood [facility] ULTRA) test strip Use to test blood sugars 1-2 times daily. 05/20/19  Yes [name], MD  Lancets [facility] ULTRASOFT) lancets Use to test blood sugars 1-2 times daily. 05/20/19  Yes [name], MD  losartan (COZAAR) 25 MG tablet  TAKE 1 TABLET BY MOUTH  DAILY 05/12/19  Yes Buford Dresser, MD  metFORMIN (GLUCOPHAGE) 500 MG tablet TAKE 1 TABLET BY MOUTH TWO  TIMES DAILY WITH MEALS Patient taking differently: Take 500 mg by mouth 2 (two) times daily with a meal.  05/26/19  Yes [name], MD  nitroGLYCERIN (NITROSTAT) 0.4 MG SL tablet DISSOLVE 1 TABLET UNDER THE TONGUE EVERY 5 MINUTES AS  NEEDED FOR CHEST PAIN MAX 3 TABS IN 15 MINUTES, CALL  911 IF PAIN PERSISTS Patient taking differently: Place 0.4 mg under the tongue every 5 (five) minutes as needed for chest pain.  05/05/19  Yes [name], MD  traZODone (DESYREL) 50 MG tablet TAKE 1/2 TO 1 TABLET BY  MOUTH AT BEDTIME AS NEEDED  FOR SLEEP. Patient taking differently: Take 25-50 mg by mouth at bedtime as needed for sleep.  05/26/19  Yes [name], MD  nicotine (NICODERM CQ - DOSED IN MG/24 HR) 7 mg/24hr patch Place 1 patch (7 mg total) onto the skin daily. Patient not taking: Reported on 06/12/2019 07/31/18  Kayleen Memos, DO    Family History Family History  Problem Relation Age of Onset  . Diabetes Sister   . Coronary artery disease Brother     Social History Social History   Tobacco Use  . Smoking status: Current Every Day Smoker    Packs/day: 0.50    Types: Cigarettes  . Smokeless tobacco: Never Used  Substance Use Topics  . Alcohol use: Yes    Comment: Drinks up to six beers around a game  . Drug use: Yes    Types: Marijuana    Comment: occ marijuana     Allergies   Lisinopril and Tomato   Review of Systems Review of Systems  Constitutional: Negative for chills and fever.  Eyes: Negative for photophobia and visual disturbance.  Respiratory: Positive for chest tightness, shortness of breath and wheezing. Negative for cough and choking.   Cardiovascular: Negative for chest pain and palpitations.  Gastrointestinal: Positive for constipation. Negative for abdominal pain, nausea and vomiting.  Skin: Negative  for pallor and rash.  Neurological: Negative for dizziness, seizures, syncope and light-headedness.  Psychiatric/Behavioral: Negative for agitation and confusion.  All other systems reviewed and are negative.    Physical Exam Updated Vital Signs BP (!) 148/70   Pulse 81   Temp 98.1 F (36.7 C) (Oral)   Resp 15   SpO2 96%   Physical Exam Vitals signs and nursing note reviewed.  Constitutional:      Appearance: He is well-developed.  HENT:     Head: Normocephalic and atraumatic.  Eyes:     Conjunctiva/sclera: Conjunctivae normal.  Neck:     Musculoskeletal: Neck supple.  Cardiovascular:     Rate and Rhythm: Normal rate and regular rhythm.  Pulmonary:     Effort: Pulmonary effort is normal. No respiratory distress.     Breath sounds: Examination of the right-middle field reveals wheezing. Examination of the left-middle field reveals wheezing. Examination of the right-lower field reveals wheezing. Examination of the left-lower field reveals wheezing. Wheezing present.     Comments: 98% on room air Speaking comfortably in full sentences Abdominal:     Palpations: Abdomen is soft.     Tenderness: There is no abdominal tenderness.  Musculoskeletal:     Right lower leg: No edema.     Left lower leg: No edema.  Skin:    General: Skin is warm and dry.  Neurological:     General: No focal deficit present.     Mental Status: He is alert and oriented to person, place, and time.      ED Treatments / Results  Labs (all labs ordered are listed, but only abnormal results are displayed) Labs Reviewed  COMPREHENSIVE METABOLIC PANEL - Abnormal; Notable for the following components:      Result Value   Glucose, Bld 135 (*)    All other components within normal limits  LACTIC ACID, PLASMA - Abnormal; Notable for the following components:   Lactic Acid, Venous 2.1 (*)    All other components within normal limits  LACTIC ACID, PLASMA - Abnormal; Notable for the following  components:   Lactic Acid, Venous 2.0 (*)    All other components within normal limits  BRAIN NATRIURETIC PEPTIDE - Abnormal; Notable for the following components:   B Natriuretic Peptide 784.0 (*)    All other components within normal limits  BLOOD GAS, VENOUS - Abnormal; Notable for the following components:   pCO2, Ven 43.3 (*)    All other components  within normal limits  SARS CORONAVIRUS 2 (TAT 6-24 HRS)  CBC WITH DIFFERENTIAL/PLATELET  I-STAT VENOUS BLOOD GAS, ED  TROPONIN I (HIGH SENSITIVITY)  TROPONIN I (HIGH SENSITIVITY)    EKG EKG Interpretation  Date/Time:  Sunday June 12 2019 12:39:40 EDT Ventricular Rate:  74 PR Interval:  194 QRS Duration: 78 QT Interval:  410 QTC Calculation: 455 R Axis:   58 Text Interpretation:  Normal sinus rhythm Abnormal ECG Nonspecific T wave inversions in inferior and lateral leads  No STEMI Confirmed by Octaviano Glow [phone]) on 06/12/2019 12:55:53 PM   Radiology Dg Chest Portable 1 View  Result Date: 06/12/2019 CLINICAL DATA:  Shortness of breath EXAM: PORTABLE CHEST 1 VIEW COMPARISON:  None. FINDINGS: Lungs are clear.  No pleural effusion or pneumothorax. The heart is normal in size. Thoracic aortic atherosclerosis. IMPRESSION: No evidence of acute cardiopulmonary disease. Thoracic aortic atherosclerosis. Electronically Signed   By: Julian Hy M.D.   On: 06/12/2019 12:47    Procedures Procedures (including critical care time)  Medications Ordered in ED Medications  ipratropium-albuterol (DUONEB) 0.5-2.5 (3) MG/3ML nebulizer solution 3 mL (3 mLs Nebulization Given 06/12/19 1309)     Initial Impression / Assessment and Plan / ED Course  I have reviewed the triage vital signs and the nursing notes.  Pertinent labs & imaging results that were available during my care of the patient were reviewed by me and considered in my medical decision making (see chart for details).  58 year old gentleman presenting with episodes of  chest tightness shortness of breath beginning this morning while straining on the toilet.  Describes symptoms significantly improved with sublingual nitroglycerin which he received a dose of prior to arriving.  He is asymptomatic on my exam.  He is otherwise well-appearing on exam is not requiring supplemental oxygen.  He has very faint end expiratory bilateral wheezing.  We will check his blood gas to try to give DuoNeb treatments, although I have a lower suspicion that the symptoms are related to a COPD exacerbation.  More concern for possible acute coronary syndrome.  Will check a troponin and EKG.  He is moderate risk for his heart score.    Clinical Course as of Jun 12 1723  Sun Jun 12, 2019  1418 Patient remains asymptomatic on room air.  He reports he has not had any chest tightness or problems breathing since arriving in the ED.   [MT]  7412 Awaiting second troponin test.   [MT]  8786 Given that the patient has had relief of his symptoms with sublingual nitroglycerin, his lateral T wave inversions, and HEART score 5, I believe it is reasonable to admit him for cardiac evaluation and possible echo in the morning.   [MT]  1720 Spoke to tried hospitalist team.  Will admit patient.   [MT]    Clinical Course User Index [MT] Tj Kitchings, Carola Rhine, MD    Final Clinical Impressions(s) / ED Diagnoses   Final diagnoses:  SOB (shortness of breath)  Chest pain, unspecified type    ED Discharge Orders    None       Wyvonnia Dusky, MD 06/12/19 1724

## 2019-06-12 NOTE — ED Notes (Signed)
Venous blood gas given to Respiratory.

## 2019-06-12 NOTE — ED Triage Notes (Signed)
Pt BIB EMS from home. Pt reports SOB last few days. EMS reports pt diaphoretic and pt 87% RA. EMS gave Nitro x 2 to pt. Pt has hx of CHF, HTN, and diabetes. Pt on Eliquis. HR in 90's.   20G L hand

## 2019-06-12 NOTE — ED Notes (Signed)
ED TO INPATIENT HANDOFF REPORT  ED Nurse Name and Phone #:   S Name/Age/Gender Kenneth [name] 58 y.o. male Room/Bed: WA03/WA03  Code Status   Code Status: DNR  Home/SNF/Other Home Patient oriented to: self, place, time and situation Is this baseline? Yes   Triage Complete: Triage complete  Chief Complaint Shortness of Breath  Triage Note Pt BIB EMS from home. Pt reports SOB last few days. EMS reports pt diaphoretic and pt 87% RA. EMS gave Nitro x 2 to pt. Pt has hx of CHF, HTN, and diabetes. Pt on Eliquis. HR in 90's.   20G L hand   Allergies Allergies  Allergen Reactions  . Lisinopril Swelling  . Tomato Rash    Level of Care/Admitting Diagnosis ED Disposition    ED Disposition Condition Comment   Admit  [facility] [[id number]]  Level of Care: Telemetry [5]  Admit to tele based on following criteria: Other see comments  Comments: chest pain, history of AF  Covid Evaluation: Person Under Investigation (PUI)  Diagnosis: Shortness of breath [786.05.ICD-9-CM]  Admitting Physician: Laverna PeaceETTEY, SHAYLA D [[id number]]  Attending Physician: Laverna PeaceNETTEY, SHAYLA D [phone][[id number]]  PT Class (Do Not Modify): Observation [104]  PT Acc Code (Do Not Modify): Observation [10022]       B Medical/Surgery History Past Medical History:  Diagnosis Date  . Arthritis   . Asthma   . Atrial fibrillation ([facility])   . CHF (congestive heart failure) ([facility])   . Depression   . Diabetes mellitus without complication ([facility])   . Emphysema of lung ([facility])   . Heart murmur   . Hyperlipidemia   . Hypertension    History reviewed. No pertinent surgical history.   A IV Location/Drains/Wounds Patient Lines/Drains/Airways Status   Active Line/Drains/Airways    None          Intake/Output Last 24 hours No intake or output data in the 24 hours ending 06/12/19 2144  Labs/Imaging Results for orders placed or performed during the hospital encounter of 06/12/19 (from the past  48 hour(s))  Comprehensive metabolic panel     Status: Abnormal   Collection Time: 06/12/19 11:25 AM  Result Value Ref Range   Sodium 135 135 - 145 mmol/L   Potassium 3.6 3.5 - 5.1 mmol/L   Chloride 98 98 - 111 mmol/L   CO2 28 22 - 32 mmol/L   Glucose, Bld 135 (H) 70 - 99 mg/dL   BUN 6 6 - 20 mg/dL   Creatinine, Ser 1.911.12 0.61 - 1.24 mg/dL   Calcium 8.9 8.9 - 47.810.3 mg/dL   Total Protein 6.9 6.5 - 8.1 g/dL   Albumin 3.8 3.5 - 5.0 g/dL   AST 18 15 - 41 U/L   ALT 20 0 - 44 U/L   Alkaline Phosphatase 57 38 - 126 U/L   Total Bilirubin 0.6 0.3 - 1.2 mg/dL   GFR calc non Af Amer >60 >60 mL/min   GFR calc Af Amer >60 >60 mL/min   Anion gap 9 5 - 15    Comment: Performed at [facility], [address] [place], [place] [id number]  CBC with Differential     Status: None   Collection Time: 06/12/19 11:25 AM  Result Value Ref Range   WBC 6.1 4.0 - 10.5 K/uL   RBC 4.26 4.22 - 5.81 MIL/uL   Hemoglobin 13.2 13.0 - 17.0 g/dL   HCT 13.041.1 86.539.0 - 78.452.0 %   MCV 96.5 80.0 - 100.0 fL  MCH 31.0 26.0 - 34.0 pg   MCHC 32.1 30.0 - 36.0 g/dL   RDW 35.0 09.3 - 81.8 %   Platelets 261 150 - 400 K/uL   nRBC 0.0 0.0 - 0.2 %   Neutrophils Relative % 72 %   Neutro Abs 4.3 1.7 - 7.7 K/uL   Lymphocytes Relative 15 %   Lymphs Abs 0.9 0.7 - 4.0 K/uL   Monocytes Relative 10 %   Monocytes Absolute 0.6 0.1 - 1.0 K/uL   Eosinophils Relative 2 %   Eosinophils Absolute 0.1 0.0 - 0.5 K/uL   Basophils Relative 1 %   Basophils Absolute 0.0 0.0 - 0.1 K/uL   Immature Granulocytes 0 %   Abs Immature Granulocytes 0.02 0.00 - 0.07 K/uL    Comment: Performed at [facility], [address] [place], [place] [address]  Lactic acid, plasma     Status: Abnormal   Collection Time: 06/12/19 11:25 AM  Result Value Ref Range   Lactic Acid, Venous 2.1 (HH) 0.5 - 1.9 mmol/L    Comment: CRITICAL RESULT CALLED TO, READ BACK BY AND VERIFIED WITH: A.LEBWERR,RN [id number] @1209  BY  V.WILKINS Performed at [facility], [address], [place], [place] [address]   Brain natriuretic peptide     Status: Abnormal   Collection Time: 06/12/19 11:25 AM  Result Value Ref Range   B Natriuretic Peptide 784.0 (H) 0.0 - 100.0 pg/mL    Comment: Performed at [facility], [address] [place], [place] [address]  Lactic acid, plasma     Status: Abnormal   Collection Time: 06/12/19  1:25 PM  Result Value Ref Range   Lactic Acid, Venous 2.0 (HH) 0.5 - 1.9 mmol/L    Comment: CRITICAL VALUE NOTED.  VALUE IS CONSISTENT WITH PREVIOUSLY REPORTED AND CALLED VALUE. Performed at [facility], [address], [place], [place] [address]   Troponin I (High Sensitivity)     Status: None   Collection Time: 06/12/19  1:25 PM  Result Value Ref Range   Troponin I (High Sensitivity) 8 <18 ng/L    Comment: (NOTE) Elevated high sensitivity troponin I (hsTnI) values and significant  changes across serial measurements may suggest ACS but many other  chronic and acute conditions are known to elevate hsTnI results.  Refer to the "Links" section for chest pain algorithms and additional  guidance. Performed at [facility], [address], [place], [place] [address]   Blood gas, venous     Status: Abnormal   Collection Time: 06/12/19  1:47 PM  Result Value Ref Range   pH, Ven 7.361 7.250 - 7.430   pCO2, Ven 43.3 (L) 44.0 - 60.0 mmHg   pO2, Ven RBV 32.0 - 45.0 mmHg    Comment: BELOW REPORTABLE RANGE. RN REBECCA AT 1407 BY DEE WALTERS RRT ON 06/12/19    Bicarbonate 24.0 20.0 - 28.0 mmol/L   Acid-base deficit 1.0 0.0 - 2.0 mmol/L   O2 Saturation 42.6 %   Patient temperature 98.1    Collection site DRAWN BY RN    Drawn by DRAWN BY RN    Sample type VEIN     Comment: Performed at [facility], [address] [place], [place] [address]  Troponin I (High Sensitivity)     Status: None   Collection  Time: 06/12/19  3:57 PM  Result Value Ref Range   Troponin I (High Sensitivity) 11 <18 ng/L    Comment: (  NOTE) Elevated high sensitivity troponin I (hsTnI) values and significant  changes across serial measurements may suggest ACS but many other  chronic and acute conditions are known to elevate hsTnI results.  Refer to the "Links" section for chest pain algorithms and additional  guidance. Performed at [facility], [address], [place], [place] [address]    Dg Chest Portable 1 View  Result Date: 06/12/2019 CLINICAL DATA:  Shortness of breath EXAM: PORTABLE CHEST 1 VIEW COMPARISON:  None. FINDINGS: Lungs are clear.  No pleural effusion or pneumothorax. The heart is normal in size. Thoracic aortic atherosclerosis. IMPRESSION: No evidence of acute cardiopulmonary disease. Thoracic aortic atherosclerosis. Electronically Signed   By: Julian Hy M.D.   On: 06/12/2019 12:47    Pending Labs Unresulted Labs (From admission, onward)    Start     Ordered   06/12/19 1658  SARS CORONAVIRUS 2 (TAT 6-24 HRS) Nasopharyngeal Nasopharyngeal Swab  (Asymptomatic/Tier 2 Patients Labs)  Once,   STAT    Question Answer Comment  Is this test for diagnosis or screening Screening   Symptomatic for COVID-19 as defined by CDC No   Hospitalized for COVID-19 No   Admitted to ICU for COVID-19 No   Previously tested for COVID-19 Yes   Resident in a congregate (group) care setting No   Employed in healthcare setting No      06/12/19 1658   Signed and Held  Basic metabolic panel  Tomorrow morning,   R     Signed and Held   Signed and Held  CBC  Tomorrow morning,   R     Signed and Held   Signed and Held  Lactic acid, plasma  STAT Now then every 3 hours,   R     Signed and Held          Vitals/Pain Today's Vitals   06/12/19 2000 06/12/19 2030 06/12/19 2100 06/12/19 2130  BP: (!) 156/84 (!) 175/98 (!) 158/115 (!) 181/96  Pulse: 75 70 80 78  Resp: 16 19 18 18   Temp:       TempSrc:      SpO2: 96% 97% 96% 95%  PainSc:        Isolation Precautions No active isolations  Medications Medications  ipratropium-albuterol (DUONEB) 0.5-2.5 (3) MG/3ML nebulizer solution 3 mL (3 mLs Nebulization Given 06/12/19 1309)    Mobility walks Low fall risk   Focused Assessments Cardiac Assessment Handoff:    Lab Results  Component Value Date   TROPONINI <0.03 08/11/2018   No results found for: DDIMER Does the Patient currently have chest pain? No     R Recommendations: See Admitting Provider Note  Report given to:   Additional Notes:

## 2019-06-12 NOTE — ED Notes (Signed)
Dr. Langston Masker aware patients lactic acid is 2.1

## 2019-06-13 ENCOUNTER — Observation Stay (HOSPITAL_BASED_OUTPATIENT_CLINIC_OR_DEPARTMENT_OTHER): Payer: Medicare Other

## 2019-06-13 DIAGNOSIS — E119 Type 2 diabetes mellitus without complications: Secondary | ICD-10-CM

## 2019-06-13 DIAGNOSIS — F1010 Alcohol abuse, uncomplicated: Secondary | ICD-10-CM | POA: Diagnosis not present

## 2019-06-13 DIAGNOSIS — F418 Other specified anxiety disorders: Secondary | ICD-10-CM

## 2019-06-13 DIAGNOSIS — I5042 Chronic combined systolic (congestive) and diastolic (congestive) heart failure: Secondary | ICD-10-CM

## 2019-06-13 DIAGNOSIS — R0789 Other chest pain: Secondary | ICD-10-CM | POA: Diagnosis not present

## 2019-06-13 DIAGNOSIS — J449 Chronic obstructive pulmonary disease, unspecified: Secondary | ICD-10-CM | POA: Diagnosis not present

## 2019-06-13 DIAGNOSIS — R0602 Shortness of breath: Secondary | ICD-10-CM | POA: Diagnosis not present

## 2019-06-13 DIAGNOSIS — Z720 Tobacco use: Secondary | ICD-10-CM | POA: Diagnosis not present

## 2019-06-13 LAB — BASIC METABOLIC PANEL
Anion gap: 10 (ref 5–15)
BUN: 10 mg/dL (ref 6–20)
CO2: 26 mmol/L (ref 22–32)
Calcium: 9.2 mg/dL (ref 8.9–10.3)
Chloride: 100 mmol/L (ref 98–111)
Creatinine, Ser: 1.09 mg/dL (ref 0.61–1.24)
GFR calc Af Amer: >60 mL/min (ref >60)
GFR calc non Af Amer: >60 mL/min (ref >60)
Glucose, Bld: 255 mg/dL — ABNORMAL HIGH (ref 70–99)
Potassium: 3.8 mmol/L (ref 3.5–5.1)
Sodium: 136 mmol/L (ref 135–145)

## 2019-06-13 LAB — CBC
HCT: 43 % (ref 39.0–52.0)
Hemoglobin: 13.8 g/dL (ref 13.0–17.0)
MCH: 30.9 pg (ref 26.0–34.0)
MCHC: 32.1 g/dL (ref 30.0–36.0)
MCV: 96.4 fL (ref 80.0–100.0)
Platelets: 264 10*3/uL (ref 150–400)
RBC: 4.46 MIL/uL (ref 4.22–5.81)
RDW: 12.6 % (ref 11.5–15.5)
WBC: 7.2 10*3/uL (ref 4.0–10.5)
nRBC: 0 % (ref 0.0–0.2)

## 2019-06-13 LAB — ECHOCARDIOGRAM LIMITED
Height: 75 in
Weight: 2567.92 oz

## 2019-06-13 LAB — GLUCOSE, CAPILLARY
Glucose-Capillary: 143 mg/dL — ABNORMAL HIGH (ref 70–99)
Glucose-Capillary: 231 mg/dL — ABNORMAL HIGH (ref 70–99)

## 2019-06-13 LAB — SARS CORONAVIRUS 2 (TAT 6-24 HRS): SARS Coronavirus 2: NEGATIVE

## 2019-06-13 MED ORDER — PREDNISONE 10 MG (21) PO TBPK: ORAL_TABLET | ORAL | 0 refills | Status: DC

## 2019-06-13 NOTE — Consult Note (Addendum)
Cardiology Consultation:   Patient ID: Kenneth [name] MRN: [id number]; DOB: 1961-03-15  Admit date: 06/12/2019 Date of Consult: 06/13/2019  Primary Care Provider: [name], MD Primary Cardiologist: Kenneth Dresser, MD  Primary Electrophysiologist:  None  Sleep Clinic: [name]    Patient Profile:   Kenneth [name] is a 58 y.o. male with a hx of combined systolic and diastolic CHF, nonischemic cardiomyopathy, atrial fibrillation, COPD, tobacco abuse, ETOH abuse, DM, HTN, HLD and OSA on CPAP, who is being seen today for the evaluation of dyspnea at the request of Kenneth [name], Kenneth [name].   History of Present Illness:   Kenneth [name] is a 58 y/o male w/ a h/o combined systolic and diastolic CHF, nonischemic cardiomyopathy, atrial fibrillation, COPD, tobacco abuse, ETOH abuse, DM, HTN, HLD and OSA.  He recently established care with [name] in January. Prior to that, he had been followed by a cardiologist in [name] but recently moved back to the area. Per care everywhere notes: history of angioedema on ACEi, was on losartan and tolerating in 2016; however in [place] he was not on ARB but hydralazine instead. Cath/echo in 2011 showed normal coronaries, reduced LV function with EF 40%. He was admitted for COPD and HF exacerbation in 11/2017 in [name], and at that time he was on pradaxa for anticoagulation. He was seen by Kenneth [name] in the afib clinic in 07/2018, started on apixaban and carvedilol uptitrated. Echo repeated 08/2018 and showed EF in the range of 30-35% w/ diffuse hypokinesis and hypokinesis of the inferior and inferoseptal myocardium, G2DD and mild AI.   At initial visit w/ [name] 10/21/18, he was doing fairly well from a cardiac standpoint. Kenneth [name] was unable to titrate his  blocker due to HR in the 48s. She restarted Losartan 25 mg daily. Apixaban continued for stroke prophylaxis. Given reports of fatigue and snoring, she ordered  for him to undergo a sleep study. This was done in July and study read by [name] and findings were c/w OSA. CPAP was recommended. Pt did not return to sleep clinic and no f/u in general cardiology clinic since January.   On 06/12/19, pt was brought in by EMS for SOB, chest tightness and hypoxia. O2 sats 87% on RA. Pt described worsening dyspnea at rest and w/ exertion, w/ seasonal changes. Has had some associated wheezing. He usually has COPD flares w/ season/ temperature changes. Yesterday morning, he went outside in the cold air to take out his trash and had to chase after his dog. Afterwards, he was SOB with wheezing and chest tightness. He used his inhaler w/o much relief, then took a SL NTG and had his girlfriend call EMS. On EMS arrival, he was hypoxic and placed on [place] and brought in to the [name] ED.   In the ED, he was COVID negative. BNP elevated at 784. Hs troponin negative x 2. CBC unremarkable. Normal WBC ct. No anemia. Hgb 13. CMP also unremarkable, other than elevated blood glucose at 135. SCr normal at 1.12. K 3.6. HFTs WNL. EKG showed NSR, normal axis, 74 bpm, LVH w/ repol abnormalities c/w prior EKGs. CXR showed no evidence of acute cardiopulmonary disease. He was given steroids and nebulizer treatment in the ED w/ improvement in symptoms and admitted by IM for suspected mild acute exacerbation of his COPD.   This morning, he his feeling better and back to his baseline. No further dyspnea. No wheezing. His chest tightness resolved. No edema on exam.  He was able to sleep flat last night w/o orthopnea or PND. He admits to not always being compliant w/ daily lasix. Occasionally will skip a few days. He reports that he just received his CPAP and has been using nightly.    Heart Pathway Score:     Past Medical History:  Diagnosis Date  . Arthritis   . Asthma   . Atrial fibrillation ([place])   . CHF (congestive heart failure) ([place])   . Depression   . Diabetes mellitus without complication ([place])    . Emphysema of lung ([place])   . Heart murmur   . Hyperlipidemia   . Hypertension     History reviewed. No pertinent surgical history.   Home Medications:  Prior to Admission medications   Medication Sig Start Date End Date Taking? Authorizing Provider  albuterol (PROVENTIL HFA;VENTOLIN HFA) 108 (90 Base) MCG/ACT inhaler Inhale 1-2 puffs into the lungs every 4 (four) hours as needed for wheezing or shortness of breath.  06/19/16  Yes [provider]  albuterol (PROVENTIL) (2.5 MG/3ML) 0.083% nebulizer solution Take 3 mLs (2.5 mg total) by nebulization 5 (five) times daily as needed for wheezing or shortness of breath. 08/04/18  Yes Kenneth Maw, MD  amLODipine (NORVASC) 5 MG tablet TAKE 1 TABLET BY MOUTH  DAILY Patient taking differently: Take 5 mg by mouth daily.  05/25/19  Yes Kenneth Maw, MD  Aspirin-Salicylamide-Caffeine [name] HEADACHE POWDER PO) Take 1 packet by mouth every 6 (six) hours as needed (headache, pain).   Yes [provider]  Blood Glucose Monitoring Suppl (ONE TOUCH ULTRA 2) w/Device KIT Use to test blood sugars 1-2 times daily. 05/20/19  Yes Kenneth Maw, MD  busPIRone (BUSPAR) 15 MG tablet TAKE 1 TABLET BY MOUTH TWO  TIMES DAILY Patient taking differently: Take 15 mg by mouth 2 (two) times daily.  05/26/19  Yes Kenneth Maw, MD  carvedilol (COREG) 6.25 MG tablet Take 2 tablets (12.5 mg total) by mouth 2 (two) times daily. 06/06/19  Yes Kenneth Maw, MD  ELIQUIS 5 MG TABS tablet TAKE 1 TABLET BY MOUTH TWO  TIMES DAILY Patient taking differently: Take 5 mg by mouth 2 (two) times daily.  03/28/19  Yes Kenneth Dresser, MD  famotidine (PEPCID) 20 MG tablet TAKE 1 TABLET BY MOUTH  DAILY Patient taking differently: Take 20 mg by mouth daily.  05/04/19  Yes Kenneth Maw, MD  FLUoxetine (PROZAC) 20 MG capsule TAKE 3 CAPSULES BY MOUTH  DAILY Patient taking differently: Take 60 mg by mouth daily.  05/26/19  Yes  Kenneth Maw, MD  furosemide (LASIX) 20 MG tablet TAKE 1 TABLET BY MOUTH  DAILY Patient taking differently: Take 20 mg by mouth daily.  05/26/19  Yes Kenneth Maw, MD  glucose blood [facility] ULTRA) test strip Use to test blood sugars 1-2 times daily. 05/20/19  Yes Kenneth Maw, MD  Lancets [facility] ULTRASOFT) lancets Use to test blood sugars 1-2 times daily. 05/20/19  Yes Kenneth Maw, MD  losartan (COZAAR) 25 MG tablet TAKE 1 TABLET BY MOUTH  DAILY 05/12/19  Yes Kenneth Dresser, MD  metFORMIN (GLUCOPHAGE) 500 MG tablet TAKE 1 TABLET BY MOUTH TWO  TIMES DAILY WITH MEALS Patient taking differently: Take 500 mg by mouth 2 (two) times daily with a meal.  05/26/19  Yes Kenneth Maw, MD  nitroGLYCERIN (NITROSTAT) 0.4 MG SL tablet DISSOLVE 1 TABLET UNDER THE TONGUE EVERY 5 MINUTES AS  NEEDED FOR CHEST PAIN  MAX 3 TABS IN 15 MINUTES, CALL  911 IF PAIN PERSISTS Patient taking differently: Place 0.4 mg under the tongue every 5 (five) minutes as needed for chest pain.  05/05/19  Yes Kenneth Maw, MD  traZODone (DESYREL) 50 MG tablet TAKE 1/2 TO 1 TABLET BY  MOUTH AT BEDTIME AS NEEDED  FOR SLEEP. Patient taking differently: Take 25-50 mg by mouth at bedtime as needed for sleep.  05/26/19  Yes Kenneth Maw, MD  nicotine (NICODERM CQ - DOSED IN MG/24 HR) 7 mg/24hr patch Place 1 patch (7 mg total) onto the skin daily. Patient not taking: Reported on 06/12/2019 07/31/18   Kayleen Memos, DO    Inpatient Medications: Scheduled Meds: . amLODipine  5 mg Oral Daily  . apixaban  5 mg Oral BID  . busPIRone  15 mg Oral BID  . carvedilol  12.5 mg Oral BID  . famotidine  20 mg Oral Daily  . FLUoxetine  60 mg Oral Daily  . furosemide  20 mg Oral Daily  . insulin aspart  0-9 Units Subcutaneous TID WC  . ipratropium-albuterol  3 mL Nebulization TID  . losartan  25 mg Oral Daily  . methylPREDNISolone (SOLU-MEDROL) injection  60 mg Intravenous Q12H    Followed by  . [START ON 06/14/2019] predniSONE  40 mg Oral Q breakfast  . nicotine  7 mg Transdermal Daily   Continuous Infusions:  PRN Meds: acetaminophen **OR** acetaminophen, albuterol, hydrALAZINE, nitroGLYCERIN, ondansetron **OR** ondansetron (ZOFRAN) IV, senna-docusate, traZODone  Allergies:    Allergies  Allergen Reactions  . Lisinopril Swelling  . Tomato Rash    Social History:   Social History   Socioeconomic History  . Marital status: Legally Separated    Spouse name: Not on file  . Number of children: Not on file  . Years of education: Not on file  . Highest education level: Not on file  Occupational History  . Not on file  Social Needs  . Financial resource strain: Not on file  . Food insecurity    Worry: Not on file    Inability: Not on file  . Transportation needs    Medical: Not on file    Non-medical: Not on file  Tobacco Use  . Smoking status: Current Every Day Smoker    Packs/day: 0.50    Types: Cigarettes  . Smokeless tobacco: Never Used  Substance and Sexual Activity  . Alcohol use: Yes    Comment: Drinks up to six beers around a game  . Drug use: Yes    Types: Marijuana    Comment: occ marijuana  . Sexual activity: Yes    Partners: Female  Lifestyle  . Physical activity    Days per week: Not on file    Minutes per session: Not on file  . Stress: Not on file  Relationships  . Social Herbalist on phone: Not on file    Gets together: Not on file    Attends religious service: Not on file    Active member of club or organization: Not on file    Attends meetings of clubs or organizations: Not on file    Relationship status: Not on file  . Intimate partner violence    Fear of current or ex partner: Not on file    Emotionally abused: Not on file    Physically abused: Not on file    Forced sexual activity: Not on file  Other Topics Concern  . Not  on file  Social History Narrative  . Not on file    Family History:     Family History  Problem Relation Age of Onset  . Diabetes Sister   . Coronary artery disease Brother      ROS:  Please see the history of present illness.   All other ROS reviewed and negative.     Physical Exam/Data:   Vitals:   06/12/19 2237 06/12/19 2328 06/13/19 0316 06/13/19 0703  BP:    (!) 153/96  Pulse:    80  Resp:    18  Temp:    97.8 F (36.6 C)  TempSrc:    Oral  SpO2: 100%  100% 99%  Weight:  73.5 kg  72.8 kg  Height:  6' 3"  (1.905 m)     No intake or output data in the 24 hours ending 06/13/19 0742 Last 3 Weights 06/13/2019 06/12/2019 05/20/2019  Weight (lbs) 160 lb 7.9 oz 162 lb 0.6 oz 161 lb 6 oz  Weight (kg) 72.8 kg 73.5 kg 73.199 kg     Body mass index is 20.06 kg/m.  General:  Well nourished, well developed, in no acute distress HEENT: normal Lymph: no adenopathy Neck: no JVD Endocrine:  No thryomegaly Vascular: No carotid bruits; FA pulses 2+ bilaterally without bruits  Cardiac:  normal S1, S2; RRR; no murmur  Lungs:  clear to auscultation bilaterally, no wheezing, rhonchi or rales  Abd: soft, nontender, no hepatomegaly  Ext: no edema Musculoskeletal:  No deformities, BUE and BLE strength normal and equal Skin: warm and dry  Neuro:  CNs 2-12 intact, no focal abnormalities noted Psych:  Normal affect   EKG:  The EKG was personally reviewed and demonstrates:   EKG showed NSR, normal axis, 74 bpm, LVH w/ repol abnormalities c/w prior EKGs.   Telemetry:  Telemetry was personally reviewed and demonstrates:  NSR 81 bpm   Relevant CV Studies: 2D echo 09/21/18 Study Conclusions  - Left ventricle: The cavity size was normal. Wall thickness was   normal. Systolic function was moderately to severely reduced. The   estimated ejection fraction was in the range of 30% to 35%.   Diffuse hypokinesis. There is hypokinesis of the inferior and   inferoseptal myocardium. Features are consistent with a   pseudonormal left ventricular filling pattern, with  concomitant   abnormal relaxation and increased filling pressure (grade 2   diastolic dysfunction). - Aortic valve: Trileaflet; moderately thickened, moderately   calcified leaflets. There was mild regurgitation. - Left atrium: The atrium was moderately dilated. - Tricuspid valve: There was trivial regurgitation. - Pulmonary arteries: Systolic pressure was mildly increased. PA   peak pressure: 32 mm Hg (S).  Laboratory Data:  High Sensitivity Troponin:   Recent Labs  Lab 06/12/19 1325 06/12/19 1557  TROPONINIHS 8 11     Chemistry Recent Labs  Lab 06/12/19 1125 06/13/19 0357  NA 135 136  K 3.6 3.8  CL 98 100  CO2 28 26  GLUCOSE 135* 255*  BUN 6 10  CREATININE 1.12 1.09  CALCIUM 8.9 9.2  GFRNONAA >60 >60  GFRAA >60 >60  ANIONGAP 9 10    Recent Labs  Lab 06/12/19 1125  PROT 6.9  ALBUMIN 3.8  AST 18  ALT 20  ALKPHOS 57  BILITOT 0.6   Hematology Recent Labs  Lab 06/12/19 1125 06/13/19 0357  WBC 6.1 7.2  RBC 4.26 4.46  HGB 13.2 13.8  HCT 41.1 43.0  MCV 96.5 96.4  MCH  31.0 30.9  MCHC 32.1 32.1  RDW 12.5 12.6  PLT 261 264   BNP Recent Labs  Lab 06/12/19 1125  BNP 784.0*    DDimer No results for input(s): DDIMER in the last 168 hours.   Radiology/Studies:  Dg Chest Portable 1 View  Result Date: 06/12/2019 CLINICAL DATA:  Shortness of breath EXAM: PORTABLE CHEST 1 VIEW COMPARISON:  None. FINDINGS: Lungs are clear.  No pleural effusion or pneumothorax. The heart is normal in size. Thoracic aortic atherosclerosis. IMPRESSION: No evidence of acute cardiopulmonary disease. Thoracic aortic atherosclerosis. Electronically Signed   By: Julian Hy M.D.   On: 06/12/2019 12:47    Assessment and Plan:   Kenneth [name] is a 58 y.o. male with a hx of combined systolic and diastolic CHF, nonischemic cardiomyopathy, atrial fibrillation, COPD, tobacco abuse, ETOH abuse, DM, HTN, HLD and OSA on CPAP who is being seen today for the evaluation of dyspnea at the  request of Kenneth [name], Kenneth [name].   1. Dyspnea: COVID negative.  Afebrile w/ normal WBC ct. BNP elevated at 784 but CXR w/o edema,  as well as no effusions and no infiltrate. EKG NSR and no acute ST changes. No arrhythmias on tele. Hs troponin negative x 2. No anemia.   Based on worsening dyspnea and wheezing w/ seasonal and temperature changes, suspect primary cause of symptoms is mild acute COPD exacerbation.  2. COPD w/ Mild Exacerbation: improved after treatment w/ steroids and nebulizer treatments. Back to normal baseline. Lung exam today w/ decreased BS at the bases c/w COPD but no wheezing.  Further management per IM and PCP.   Smoking cessation advised.   3. Combined Chronic Systolic and Diastolic CHF: BNP mildly elevated at  784 on admit, but CXR w/o edema. No peripheral edema on exam. Lungs are clear w/o rales. No JVD.   Recommend continuation of PO Lasix, 20 mg daily.   Pt admits to not being 100% compliant w/ diuretics. We discussed the importance of compliance to reduce risk of acute CHF exacerbation.   4. PAF: NSR on EKG. No afib on tele  Continue  blocker for rate control.   If frequent recurrence of COPDE, consider changing from Coreg to a cardioselective  blocker  Avoid [place] given reduced LV systolic function.   Continue apxiaban for a/c.  5. NICM: cardiac cath in 2011, at time of diagnosis of systolic HF, showed normal coronaries. Last echo in 2019 showed EF at 30-35%.   Continue Losartan and Coreg  F/u with [name] post [facility] for f/u and further optimization of HF regimen  Will defer timing of repeat echo to [name] to reassess EF and decide on referral to EP for ICD consideration.   6. Chest Pain: suspect related to COPE exacerbation, as CP occurred w/ wheezing and dyspnea and resolved after treatment w/ nebulizer and steroids. Hs troponin negative x 2 and EKG w/o ischemic changes.   Normal coronaries by cath in 2011  No  plans for further inpatient cardiac w/u  Can f/u with [name] or [facility] post hospital to reassess  If recurrent symptoms, may later consider outpatient NST or coronary CTA  7. OSA: new diagnosis 03/2019   Continue CPAP nightly     For questions or updates, please contact [place] Please consult [email or web address] for contact info under     Signed, Lyda Jester, PA-C  06/13/2019 7:42 AM   Attending Note:   The patient was seen and examined.  Agree with assessment  and plan as noted above.  Changes made to the above note as needed.  Patient seen and independently examined with Ellen Henri, PA .   We discussed all aspects of the encounter. I agree with the assessment and plan as stated above.  1.   Worsening dyspnea:   Likely due to COPD exacerbation .  May have had bronchospasm related to allergen exposure .  All symptoms are better after getting steroids and bronchodilator . Troponins are negative  Lungs have coarse rhonchi.   He still smokes  - i've advised him about the dangers of continued smoking.  He is stable for DC today He will not need to follow up with cardiology He should follow up with his primary MD     I have spent a total of 40 minutes with patient reviewing hospital  notes , telemetry, EKGs, labs and examining patient as well as establishing an assessment and plan that was discussed with the patient. > 50% of time was spent in direct patient care.    Thayer Headings, Brooke Bonito., MD, [facility] 06/13/2019, 12:27 PM [address]  [place] Pager [phone]

## 2019-06-13 NOTE — Discharge Summary (Signed)
Physician Discharge Summary  Kenneth [name] BJS:[id number] DOB: [birth year] DOA: 06/12/2019  PCP: [name], MD  Admit date: 06/12/2019 Discharge date: 06/13/2019  Admitted From: Observation Disposition: home  Recommendations for Outpatient Follow-up:  1. Follow up with PCP in 1-2 weeks   Home Health:No Equipment/Devices:none  Discharge Condition:Stable CODE STATUS:Full code Diet recommendation: Regular healthy diet  Brief/Interim Summary: Kenneth [name] is a 58 y.o. male with medical history significant for combined CHF with preserved EF and grade 2 diastolic dysfunction, paroxysmal atrial fibrillation on Eliquis, HTN, type 2 diabetes, COPD who presents on 06/12/2019 with progressive dyspnea over the last few days with season change but acutely worsened in the past 24 hours.   With change in weather, he has noticed increased SOB with exertion while moving in house.  At baseline he gets short of breath doing household chores like vacuuming.  Today his shortness of breath worsened after using the bathroom with only minimal improvement with his albuterol inhaler. Felt like he was gasping for air and had his friend call EMS; while waiting he took a nitro which he states helped the shortness of breath and "tightness from his breathing". Started taking a cold/decongestant a few days ago as well but cannot member the name  Hospital Course: Worsening dyspnea at rest and with exertion.  This was consistent with COPD flare given wheezing, brief hypoxia, worsening dyspnea with seasonal change consistent with typical flare.  Continued scheduled duo nebs.  Not currently requiring O2, IV Solu-Medrol twice daily while in the hospital, pt resp status was rapidly at baseline.  He will vbe discharged home to complete a course of steroids, cxr was clear, no indication for abx  Chest tightness, stable.  Differential includes COPD exacerbation, ACS. Still suspect may be related to COPD flare as patient  reports shortness of breath improved with inhaler regimen here  Resolved with sublingual nitro at home, not recurrent in hospital, high-sensitivity troponin trend negative.  EKG does show some new T wave inversions. Seen by cardiology no changes, felt pt could f/up outpt as needed and that sx were driven by lung dz  COPD with acute mild exacerbation.  treatment as above  Chronic combined systolic and diastolic CHF (EF 60-73% on TTE 71/0626, grade 2 diastolic dysfunction).  No signs of volume overload on exam, BNP 784, chest x-ray unremarkable.  Continue home oral Lasix.  Daily weights, strict I's and O's, fluid strict, low-sodium diet, while in the hospital, c/w losartan  Paroxysmal atrial fibrillation.  Currently in normal sinus rhythm.  Continue home Coreg and Eliquis.  Monitored on telemetry.  Type 2 diabetes, well controlled.  Held home metformin, monitor CBGs, sliding scale as needed while inpty, pt to resume home meds as outpt and PCP to monitor lipids and A1C  Depression with anxiety, stable.  Continue home fluoxetine ON D/C  Ethanol abuse.  Admits to drinking half a pint of whiskey with past 3 to 5 days.  Denies any prior history of withdrawals.  NO EVID OF THAT WHILE INPT  HTN.  Elevated in ED, likely related to missing home BP meds.  Continue home amlodipine, Coreg.  IV hydralazine PRN.   Discharge Diagnoses:  Active Problems:   Essential hypertension   Chronic obstructive pulmonary disease ([place])   Controlled type 2 diabetes mellitus without complication, without long-term current use of insulin (HCC)   Depression with anxiety   Alcohol abuse   PAF (paroxysmal atrial fibrillation) (HCC)   Chronic combined systolic and diastolic heart failure ([place])  Gastroesophageal reflux disease without esophagitis   Tobacco abuse   Anticoagulant long-term use   Shortness of breath   Chest tightness    Discharge Instructions  Discharge Instructions    Call MD for:   Complete  by: As directed    For any acute change in medical condition   Call MD for:  difficulty breathing, headache or visual disturbances   Complete by: As directed    Call MD for:  extreme fatigue   Complete by: As directed    Call MD for:  hives   Complete by: As directed    Call MD for:  persistant dizziness or light-headedness   Complete by: As directed    Call MD for:  persistant nausea and vomiting   Complete by: As directed    Call MD for:  redness, tenderness, or signs of infection (pain, swelling, redness, odor or green/yellow discharge around incision site)   Complete by: As directed    Call MD for:  severe uncontrolled pain   Complete by: As directed    Call MD for:  temperature >100.4   Complete by: As directed    Diet - low sodium heart healthy   Complete by: As directed    Increase activity slowly   Complete by: As directed      Allergies as of 06/13/2019      Reactions   Lisinopril Swelling   Tomato Rash      Medication List    TAKE these medications   albuterol 108 (90 Base) MCG/ACT inhaler Commonly known as: VENTOLIN HFA Inhale 1-2 puffs into the lungs every 4 (four) hours as needed for wheezing or shortness of breath.   albuterol (2.5 MG/3ML) 0.083% nebulizer solution Commonly known as: PROVENTIL Take 3 mLs (2.5 mg total) by nebulization 5 (five) times daily as needed for wheezing or shortness of breath.   amLODipine 5 MG tablet Commonly known as: NORVASC TAKE 1 TABLET BY MOUTH  DAILY Notes to patient: 06/14/19   [facility] HEADACHE POWDER PO Take 1 packet by mouth every 6 (six) hours as needed (headache, pain).   busPIRone 15 MG tablet Commonly known as: BUSPAR TAKE 1 TABLET BY MOUTH TWO  TIMES DAILY Notes to patient: 06/13/19   carvedilol 6.25 MG tablet Commonly known as: COREG Take 2 tablets (12.5 mg total) by mouth 2 (two) times daily. Notes to patient: 06/13/19   Eliquis 5 MG Tabs tablet Generic drug: apixaban TAKE 1 TABLET BY MOUTH TWO  TIMES  DAILY What changed: how much to take Notes to patient: 06/13/19   famotidine 20 MG tablet Commonly known as: PEPCID TAKE 1 TABLET BY MOUTH  DAILY Notes to patient: 06/14/19   FLUoxetine 20 MG capsule Commonly known as: PROZAC TAKE 3 CAPSULES BY MOUTH  DAILY Notes to patient: 06/14/19   furosemide 20 MG tablet Commonly known as: LASIX TAKE 1 TABLET BY MOUTH  DAILY Notes to patient: 06/14/19   losartan 25 MG tablet Commonly known as: COZAAR TAKE 1 TABLET BY MOUTH  DAILY Notes to patient: 06/14/19   metFORMIN 500 MG tablet Commonly known as: GLUCOPHAGE TAKE 1 TABLET BY MOUTH TWO  TIMES DAILY WITH MEALS What changed: See the new instructions. Notes to patient: 06/13/19   nicotine 7 mg/24hr patch Commonly known as: NICODERM CQ - dosed in mg/24 hr Place 1 patch (7 mg total) onto the skin daily. Notes to patient: 06/14/19   nitroGLYCERIN 0.4 MG SL tablet Commonly known as: NITROSTAT DISSOLVE 1 TABLET UNDER  THE TONGUE EVERY 5 MINUTES AS  NEEDED FOR CHEST PAIN MAX 3 TABS IN 15 MINUTES, CALL  911 IF PAIN PERSISTS What changed: See the new instructions.   ONE TOUCH ULTRA 2 w/Device Kit Use to test blood sugars 1-2 times daily.   OneTouch Ultra test strip Generic drug: glucose blood Use to test blood sugars 1-2 times daily.   onetouch ultrasoft lancets Use to test blood sugars 1-2 times daily.   predniSONE 10 MG (21) Tbpk tablet Commonly known as: STERAPRED UNI-PAK 21 TAB Per package insert Notes to patient: 06/14/19   traZODone 50 MG tablet Commonly known as: DESYREL TAKE 1/2 TO 1 TABLET BY  MOUTH AT BEDTIME AS NEEDED  FOR SLEEP. What changed:   reasons to take this  additional instructions       Allergies  Allergen Reactions  . Lisinopril Swelling  . Tomato Rash    Consultations:  CARDIOLOGY   Procedures/Studies: Dg Chest Portable 1 View  Result Date: 06/12/2019 CLINICAL DATA:  Shortness of breath EXAM: PORTABLE CHEST 1 VIEW COMPARISON:  None. FINDINGS:  Lungs are clear.  No pleural effusion or pneumothorax. The heart is normal in size. Thoracic aortic atherosclerosis. IMPRESSION: No evidence of acute cardiopulmonary disease. Thoracic aortic atherosclerosis. Electronically Signed   By: Julian Hy M.D.   On: 06/12/2019 12:47       Subjective: Patient reports he feels his breathing is at baseline, fianc is at bedside also confirmed  Discharge Exam: Vitals:   06/13/19 1330 06/13/19 1419  BP: (!) 158/85   Pulse: 77   Resp: 16   Temp: 97.9 F (36.6 C)   SpO2: 99% 98%   Vitals:   06/13/19 0847 06/13/19 1030 06/13/19 1330 06/13/19 1419  BP:  (!) 151/91 (!) 158/85   Pulse:   77   Resp:   16   Temp:   97.9 F (36.6 C)   TempSrc:   Oral   SpO2: 96%  99% 98%  Weight:      Height:        General: Pt is alert, awake, not in acute distress Cardiovascular: RRR, S1/S2 +, no rubs, no gallops Respiratory: CTA bilaterally, TRACE wheezing, no rhonchi, appears to be at baseline Abdominal: Soft, NT, ND, bowel sounds + Extremities: no edema, no cyanosis    The results of significant diagnostics from this hospitalization (including imaging, microbiology, ancillary and laboratory) are listed below for reference.     Microbiology: Recent Results (from the past 240 hour(s))  SARS CORONAVIRUS 2 (TAT 6-24 HRS) Nasopharyngeal Nasopharyngeal Swab     Status: None   Collection Time: 06/12/19  5:00 PM   Specimen: Nasopharyngeal Swab  Result Value Ref Range Status   SARS Coronavirus 2 NEGATIVE NEGATIVE Final    Comment: (NOTE) SARS-CoV-2 target nucleic acids are NOT DETECTED. The SARS-CoV-2 RNA is generally detectable in upper and lower respiratory specimens during the acute phase of infection. Negative results do not preclude SARS-CoV-2 infection, do not rule out co-infections with other pathogens, and should not be used as the sole basis for treatment or other patient management decisions. Negative results must be combined with  clinical observations, patient history, and epidemiological information. The expected result is Negative. Fact Sheet for Patients: SugarRoll.be Fact Sheet for Healthcare Providers: https://www.woods-mathews.com/ This test is not yet approved or cleared by the [place] FDA and  has been authorized for detection and/or diagnosis of SARS-CoV-2 by FDA under an Emergency Use Authorization (EUA). This EUA will remain  in  effect (meaning this test can be used) for the duration of the COVID-19 declaration under Section 56 4(b)(1) of the Act, 21 U.S.C. section 360bbb-3(b)(1), unless the authorization is terminated or revoked sooner. Performed at [facility], [place] [address], [place], [place] [address]      Labs: BNP (last 3 results) Recent Labs    07/28/18 0913 06/12/19 1125  BNP 1,379.1* 779.3*   Basic Metabolic Panel: Recent Labs  Lab 06/12/19 1125 06/13/19 0357  NA 135 136  K 3.6 3.8  CL 98 100  CO2 28 26  GLUCOSE 135* 255*  BUN 6 10  CREATININE 1.12 1.09  CALCIUM 8.9 9.2   Liver Function Tests: Recent Labs  Lab 06/12/19 1125  AST 18  ALT 20  ALKPHOS 57  BILITOT 0.6  PROT 6.9  ALBUMIN 3.8   No results for input(s): LIPASE, AMYLASE in the last 168 hours. No results for input(s): AMMONIA in the last 168 hours. CBC: Recent Labs  Lab 06/12/19 1125 06/13/19 0357  WBC 6.1 7.2  NEUTROABS 4.3  --   HGB 13.2 13.8  HCT 41.1 43.0  MCV 96.5 96.4  PLT 261 264   Cardiac Enzymes: No results for input(s): CKTOTAL, CKMB, CKMBINDEX, TROPONINI in the last 168 hours. BNP: Invalid input(s): POCBNP CBG: Recent Labs  Lab 06/13/19 0813 06/13/19 1318  GLUCAP 143* 231*   D-Dimer No results for input(s): DDIMER in the last 72 hours. Hgb A1c No results for input(s): HGBA1C in the last 72 hours. Lipid Profile No results for input(s): CHOL, HDL, LDLCALC, TRIG, CHOLHDL, LDLDIRECT in the last 72 hours. Thyroid function  studies No results for input(s): TSH, T4TOTAL, T3FREE, THYROIDAB in the last 72 hours.  Invalid input(s): FREET3 Anemia work up No results for input(s): VITAMINB12, FOLATE, FERRITIN, TIBC, IRON, RETICCTPCT in the last 72 hours. Urinalysis    Component Value Date/Time   COLORURINE YELLOW 05/20/2019 0825   APPEARANCEUR CLEAR 05/20/2019 0825   LABSPEC 1.020 05/20/2019 0825   PHURINE 6.0 05/20/2019 0825   GLUCOSEU NEGATIVE 05/20/2019 0825   HGBUR NEGATIVE 05/20/2019 0825   BILIRUBINUR SMALL (A) 05/20/2019 0825   KETONESUR TRACE (A) 05/20/2019 0825   UROBILINOGEN 1.0 05/20/2019 0825   NITRITE NEGATIVE 05/20/2019 0825   LEUKOCYTESUR NEGATIVE 05/20/2019 0825   Sepsis Labs Invalid input(s): PROCALCITONIN,  WBC,  LACTICIDVEN Microbiology Recent Results (from the past 240 hour(s))  SARS CORONAVIRUS 2 (TAT 6-24 HRS) Nasopharyngeal Nasopharyngeal Swab     Status: None   Collection Time: 06/12/19  5:00 PM   Specimen: Nasopharyngeal Swab  Result Value Ref Range Status   SARS Coronavirus 2 NEGATIVE NEGATIVE Final    Comment: (NOTE) SARS-CoV-2 target nucleic acids are NOT DETECTED. The SARS-CoV-2 RNA is generally detectable in upper and lower respiratory specimens during the acute phase of infection. Negative results do not preclude SARS-CoV-2 infection, do not rule out co-infections with other pathogens, and should not be used as the sole basis for treatment or other patient management decisions. Negative results must be combined with clinical observations, patient history, and epidemiological information. The expected result is Negative. Fact Sheet for Patients: SugarRoll.be Fact Sheet for Healthcare Providers: https://www.woods-mathews.com/ This test is not yet approved or cleared by the [place] FDA and  has been authorized for detection and/or diagnosis of SARS-CoV-2 by FDA under an Emergency Use Authorization (EUA). This EUA will remain   in effect (meaning this test can be used) for the duration of the COVID-19 declaration under Section 56 4(b)(1)  of the Act, 21 U.S.C. section 360bbb-3(b)(1), unless the authorization is terminated or revoked sooner. Performed at [facility], [place] [address] [place], [place] [address]      Time coordinating discharge: Over 30 minutes  SIGNED:   Nicolette Bang, MD  [facility] 06/13/2019, 2:44 PM Pager   If 7PM-7AM, please contact night-coverage [email or web address] Password [email or web address]

## 2019-06-13 NOTE — Progress Notes (Signed)
Discharge instructions given and explained to patient, reinforced home heart failure management and scheduling folollow up appointment with PCP post hospital visit, he verbalized understanding; patient denies any pain/distress, No pressure injury noted. Patient accompanied home by family.

## 2019-06-13 NOTE — Progress Notes (Signed)
  Echocardiogram 2D Echocardiogram has been performed.  Kenneth [name] 06/13/2019, 9:04 AM

## 2019-06-14 ENCOUNTER — Telehealth: Payer: Self-pay | Admitting: *Deleted

## 2019-06-14 NOTE — Telephone Encounter (Signed)
TCM hospital follow up scheduled 06/17/19 w/ PCP

## 2019-06-14 NOTE — Telephone Encounter (Signed)
Transition Care Management Follow-up Telephone Call   Date discharged? 06/13/19   How have you been since you were released from the hospital? "doing good. Just resting"   Do you understand why you were in the hospital? yes   Do you understand the discharge instructions? yes   Where were you [facility]? [facility]   Items Reviewed:  Medications reviewed: "They just added Prednisone"  Allergies reviewed: no new allergies   Dietary changes reviewed: yes  Referrals reviewed: yes   Functional Questionnaire:   Activities of Daily Living (ADLs):   He states they are independent in the following: ambulation, bathing and hygiene, feeding, continence, grooming, toileting and dressing States they require assistance with the following: na   Any transportation issues/concerns?: no   Any patient concerns? no   Confirmed importance and date/time of follow-up visits scheduled yes  Provider Appointment booked with  Confirmed with patient if condition begins to worsen call PCP or go to the ER.  Patient was given the office number and encouraged to call back with question or concerns.  : yes

## 2019-06-17 ENCOUNTER — Other Ambulatory Visit: Payer: Self-pay

## 2019-06-17 ENCOUNTER — Encounter: Payer: Self-pay | Admitting: Family Medicine

## 2019-06-17 ENCOUNTER — Ambulatory Visit (INDEPENDENT_AMBULATORY_CARE_PROVIDER_SITE_OTHER): Payer: Medicare Other | Admitting: Family Medicine

## 2019-06-17 VITALS — BP 130/70 | HR 81 | Ht 75.0 in | Wt 166.5 lb

## 2019-06-17 DIAGNOSIS — Z09 Encounter for follow-up examination after completed treatment for conditions other than malignant neoplasm: Secondary | ICD-10-CM | POA: Diagnosis not present

## 2019-06-17 DIAGNOSIS — F418 Other specified anxiety disorders: Secondary | ICD-10-CM | POA: Diagnosis not present

## 2019-06-17 DIAGNOSIS — F1010 Alcohol abuse, uncomplicated: Secondary | ICD-10-CM | POA: Diagnosis not present

## 2019-06-17 DIAGNOSIS — J4541 Moderate persistent asthma with (acute) exacerbation: Secondary | ICD-10-CM | POA: Diagnosis not present

## 2019-06-17 DIAGNOSIS — Z720 Tobacco use: Secondary | ICD-10-CM

## 2019-06-17 MED ORDER — FLUTICASONE-SALMETEROL 250-50 MCG/DOSE IN AEPB: 1.0000 | INHALATION_SPRAY | Freq: Two times a day (BID) | RESPIRATORY_TRACT | 3 refills | Status: DC

## 2019-06-17 MED ORDER — METHYLPREDNISOLONE SODIUM SUCC 125 MG IJ SOLR
125.0000 mg | Freq: Once | INTRAMUSCULAR | Status: AC
  Administered 2019-06-17: 125 mg via INTRAMUSCULAR

## 2019-06-17 MED ORDER — PREDNISONE 10 MG (48) PO TBPK: ORAL_TABLET | ORAL | 0 refills | Status: DC

## 2019-06-17 NOTE — Progress Notes (Signed)
Established Patient Office Visit  Subjective:  Patient ID: Kenneth [name], male    DOB: May 22, 1961  Age: 58 y.o. MRN: [id number]  CC:  Chief Complaint  Patient presents with  . Hospitalization Follow-up    HPI Kenneth [name] presents for for hospital discharge follow-up status post exacerbation of COPD.  He has 3 days left of a 6-day Dosepak and still feels tight and wheezy in his chest.  Patient has a history of asthma since he was a child.  He has particularly had problems with it when the weather turns from warmer to cooler temperatures.  Denies any fevers chills or sputum production.  Unfortunately he continues to smoke.  To date he uses an albuterol inhaler as needed for wheezing.  It had become less effective for him in the days that preceded his hospitalization and he finally had to call 911.  He has not used a controller inhaler for many years.  He tells me that he is only drinking for special occasions.  Review of the hospital notes says that he had been drinking on the days prior to his admission and I confronted him with this information.  He tells me that he is only been smoking a few cigarettes today.  He continues to smoke marijuana when he can get it.  Past Medical History:  Diagnosis Date  . Arthritis   . Asthma   . Atrial fibrillation ([place])   . CHF (congestive heart failure) ([place])   . Depression   . Diabetes mellitus without complication ([place])   . Emphysema of lung ([place])   . Heart murmur   . Hyperlipidemia   . Hypertension     History reviewed. No pertinent surgical history.  Family History  Problem Relation Age of Onset  . Diabetes Sister   . Coronary artery disease Brother     Social History   Socioeconomic History  . Marital status: Legally Separated    Spouse name: Not on file  . Number of children: Not on file  . Years of education: Not on file  . Highest education level: Not on file  Occupational History  . Not on file  Social Needs  . Financial  resource strain: Not on file  . Food insecurity    Worry: Not on file    Inability: Not on file  . Transportation needs    Medical: Not on file    Non-medical: Not on file  Tobacco Use  . Smoking status: Current Every Day Smoker    Packs/day: 0.50    Types: Cigarettes  . Smokeless tobacco: Never Used  Substance and Sexual Activity  . Alcohol use: Yes    Comment: Drinks up to six beers around a game  . Drug use: Yes    Types: Marijuana    Comment: occ marijuana  . Sexual activity: Yes    Partners: Female  Lifestyle  . Physical activity    Days per week: Not on file    Minutes per session: Not on file  . Stress: Not on file  Relationships  . Social Herbalist on phone: Not on file    Gets together: Not on file    Attends religious service: Not on file    Active member of club or organization: Not on file    Attends meetings of clubs or organizations: Not on file    Relationship status: Not on file  . Intimate partner violence    Fear of current or ex  partner: Not on file    Emotionally abused: Not on file    Physically abused: Not on file    Forced sexual activity: Not on file  Other Topics Concern  . Not on file  Social History Narrative  . Not on file    Outpatient Medications Prior to Visit  Medication Sig Dispense Refill  . albuterol (PROVENTIL HFA;VENTOLIN HFA) 108 (90 Base) MCG/ACT inhaler Inhale 1-2 puffs into the lungs every 4 (four) hours as needed for wheezing or shortness of breath.     [name] albuterol (PROVENTIL) (2.5 MG/3ML) 0.083% nebulizer solution Take 3 mLs (2.5 mg total) by nebulization 5 (five) times daily as needed for wheezing or shortness of breath. 150 mL 3  . amLODipine (NORVASC) 5 MG tablet TAKE 1 TABLET BY MOUTH  DAILY (Patient taking differently: Take 5 mg by mouth daily. ) 90 tablet 3  . Aspirin-Salicylamide-Caffeine (BC HEADACHE POWDER PO) Take 1 packet by mouth every 6 (six) hours as needed (headache, pain).    . Blood Glucose  Monitoring Suppl (ONE TOUCH ULTRA 2) w/Device KIT Use to test blood sugars 1-2 times daily. 1 kit 0  . busPIRone (BUSPAR) 15 MG tablet TAKE 1 TABLET BY MOUTH TWO  TIMES DAILY (Patient taking differently: Take 15 mg by mouth 2 (two) times daily. ) 180 tablet 1  . carvedilol (COREG) 6.25 MG tablet Take 2 tablets (12.5 mg total) by mouth 2 (two) times daily. 360 tablet 0  . ELIQUIS 5 MG TABS tablet TAKE 1 TABLET BY MOUTH TWO  TIMES DAILY (Patient taking differently: Take 5 mg by mouth 2 (two) times daily. ) 180 tablet 2  . famotidine (PEPCID) 20 MG tablet TAKE 1 TABLET BY MOUTH  DAILY (Patient taking differently: Take 20 mg by mouth daily. ) 90 tablet 3  . FLUoxetine (PROZAC) 20 MG capsule TAKE 3 CAPSULES BY MOUTH  DAILY (Patient taking differently: Take 60 mg by mouth daily. ) 270 capsule 3  . furosemide (LASIX) 20 MG tablet TAKE 1 TABLET BY MOUTH  DAILY (Patient taking differently: Take 20 mg by mouth daily. ) 90 tablet 3  . glucose blood (ONETOUCH ULTRA) test strip Use to test blood sugars 1-2 times daily. 100 each 12  . Lancets (ONETOUCH ULTRASOFT) lancets Use to test blood sugars 1-2 times daily. 100 each 12  . losartan (COZAAR) 25 MG tablet TAKE 1 TABLET BY MOUTH  DAILY 90 tablet 0  . metFORMIN (GLUCOPHAGE) 500 MG tablet TAKE 1 TABLET BY MOUTH TWO  TIMES DAILY WITH MEALS (Patient taking differently: Take 500 mg by mouth 2 (two) times daily with a meal. ) 180 tablet 3  . nicotine (NICODERM CQ - DOSED IN MG/24 HR) 7 mg/24hr patch Place 1 patch (7 mg total) onto the skin daily. 28 patch 0  . nitroGLYCERIN (NITROSTAT) 0.4 MG SL tablet DISSOLVE 1 TABLET UNDER THE TONGUE EVERY 5 MINUTES AS  NEEDED FOR CHEST PAIN MAX 3 TABS IN 15 MINUTES, CALL  911 IF PAIN PERSISTS (Patient taking differently: Place 0.4 mg under the tongue every 5 (five) minutes as needed for chest pain. ) 75 tablet 0  . traZODone (DESYREL) 50 MG tablet TAKE 1/2 TO 1 TABLET BY  MOUTH AT BEDTIME AS NEEDED  FOR SLEEP. (Patient taking  differently: Take 25-50 mg by mouth at bedtime as needed for sleep. ) 30 tablet 11  . predniSONE (STERAPRED UNI-PAK 21 TAB) 10 MG (21) TBPK tablet Per package insert 1 each 0   No facility-administered  medications prior to visit.     Allergies  Allergen Reactions  . Lisinopril Swelling  . Tomato Rash    ROS Review of Systems  Constitutional: Negative.   HENT: Negative.   Eyes: Negative for photophobia and visual disturbance.  Respiratory: Positive for cough, shortness of breath and wheezing.   Cardiovascular: Negative for chest pain and leg swelling.  Gastrointestinal: Negative.   Genitourinary: Negative.   Musculoskeletal: Negative.   Skin: Negative for pallor and rash.  Allergic/Immunologic: Negative for immunocompromised state.  Hematological: Does not bruise/bleed easily.  Psychiatric/Behavioral: Negative.       Objective:    Physical Exam  Constitutional: He appears well-developed and well-nourished. No distress.  HENT:  Head: Normocephalic and atraumatic.  Right Ear: External ear normal.  Left Ear: External ear normal.  Eyes: Conjunctivae are normal. Right eye exhibits no discharge. Left eye exhibits no discharge. No scleral icterus.  Neck: Neck supple. No JVD present. No tracheal deviation present. No thyromegaly present.  Cardiovascular: An irregularly irregular rhythm present.  Pulmonary/Chest: No stridor. He has decreased breath sounds. He has wheezes. He has no rhonchi. He has no rales.  Lymphadenopathy:    He has no cervical adenopathy.  Skin: He is not diaphoretic.    BP 130/70   Pulse 81   Ht 6' 3"  (1.905 m)   Wt 166 lb 8 oz (75.5 kg)   SpO2 97%   BMI 20.81 kg/m  Wt Readings from Last 3 Encounters:  06/17/19 166 lb 8 oz (75.5 kg)  06/13/19 160 lb 7.9 oz (72.8 kg)  05/20/19 161 lb 6 oz (73.2 kg)   BP Readings from Last 3 Encounters:  06/17/19 130/70  06/13/19 (!) 158/85  05/20/19 120/70   Guideline developer:  UpToDate (see UpToDate for  funding source) Date Released: June 2014  Health Maintenance Due  Topic Date Due  . FOOT EXAM  07/12/1971    There are no preventive care reminders to display for this patient.  Lab Results  Component Value Date   TSH 4.32 07/06/2018   Lab Results  Component Value Date   WBC 7.2 06/13/2019   HGB 13.8 06/13/2019   HCT 43.0 06/13/2019   MCV 96.4 06/13/2019   PLT 264 06/13/2019   Lab Results  Component Value Date   NA 136 06/13/2019   K 3.8 06/13/2019   CO2 26 06/13/2019   GLUCOSE 255 (H) 06/13/2019   BUN 10 06/13/2019   CREATININE 1.09 06/13/2019   BILITOT 0.6 06/12/2019   ALKPHOS 57 06/12/2019   AST 18 06/12/2019   ALT 20 06/12/2019   PROT 6.9 06/12/2019   ALBUMIN 3.8 06/12/2019   CALCIUM 9.2 06/13/2019   ANIONGAP 10 06/13/2019   GFR 66.86 05/20/2019   Lab Results  Component Value Date   CHOL 226 (H) 07/06/2018   Lab Results  Component Value Date   HDL 68.40 07/06/2018   Lab Results  Component Value Date   LDLCALC 119 (H) 07/06/2018   Lab Results  Component Value Date   TRIG 190.0 (H) 07/06/2018   Lab Results  Component Value Date   CHOLHDL 3 07/06/2018   Lab Results  Component Value Date   HGBA1C 5.9 05/20/2019      Assessment & Plan:   Problem List Items Addressed This Visit      Respiratory   Moderate persistent asthma with acute exacerbation   Relevant Medications   predniSONE (STERAPRED UNI-PAK 48 TAB) 10 MG (48) TBPK tablet   Fluticasone-Salmeterol (ADVAIR DISKUS)  250-50 MCG/DOSE AEPB   Other Relevant Orders   Ambulatory referral to Pulmonology     Other   Depression with anxiety   Alcohol abuse   Tobacco abuse - Primary   Hospital discharge follow-up      Meds ordered this encounter  Medications  . methylPREDNISolone sodium succinate (SOLU-MEDROL) 125 mg/2 mL injection 125 mg  . predniSONE (STERAPRED UNI-PAK 48 TAB) 10 MG (48) TBPK tablet    Sig: Pharm to instruct a 12 day dose pack to start tomorrow.    Dispense:  48  tablet    Refill:  0  . Fluticasone-Salmeterol (ADVAIR DISKUS) 250-50 MCG/DOSE AEPB    Sig: Inhale 1 puff into the lungs 2 (two) times daily.    Dispense:  1 each    Refill:  3    Follow-up: Return in about 1 month (around 07/17/2019), or if symptoms worsen or fail to improve.   We had a long discussion about the importance of stopping smoking, including marijuana, and drinking.  He is aware that all 3 of these lifestyle choices can affect all of his chronic medical issues in a negative way.  Given information on asthma, steps to quit smoking and alcohol use disorder.

## 2019-06-17 NOTE — Patient Instructions (Signed)
Alcohol Use Disorder °Alcohol use disorder is when your drinking disrupts your daily life. When you have this condition, you drink too much alcohol and you cannot control your drinking. °Alcohol use disorder can cause serious problems with your physical health. It can affect your brain, heart, liver, pancreas, immune system, stomach, and intestines. Alcohol use disorder can increase your risk for certain cancers and cause problems with your mental health, such as depression, anxiety, psychosis, delirium, and dementia. People with this disorder risk hurting themselves and others. °What are the causes? °This condition is caused by drinking too much alcohol over time. It is not caused by drinking too much alcohol only one or two times. Some people with this condition drink alcohol to cope with or escape from negative life events. Others drink to relieve pain or symptoms of mental illness. °What increases the risk? °You are more likely to develop this condition if: °· You have a family history of alcohol use disorder. °· Your culture encourages drinking to the point of intoxication, or makes alcohol easy to get. °· You had a mood or conduct disorder in childhood. °· You have been a victim of abuse. °· You are an adolescent and: °? You have poor grades or difficulties in school. °? Your caregivers do not talk to you about saying no to alcohol, or supervise your activities. °? You are impulsive or you have trouble with self-control. °What are the signs or symptoms? °Symptoms of this condition include: °· Drinking more than you want to. °· Drinking for longer than you want to. °· Trying several times to drink less or to control your drinking. °· Spending a lot of time getting alcohol, drinking, or recovering from drinking. °· Craving alcohol. °· Having problems at work, at school, or at home due to drinking. °· Having problems in relationships due to drinking. °· Drinking when it is dangerous to drink, such as before  driving a car. °· Continuing to drink even though you know you might have a physical or mental problem related to drinking. °· Needing more and more alcohol to get the same effect you want from the alcohol (building up tolerance). °· Having symptoms of withdrawal when you stop drinking. Symptoms of withdrawal include: °? Fatigue. °? Nightmares. °? Trouble sleeping. °? Depression. °? Anxiety. °? Fever. °? Seizures. °? Severe confusion. °? Feeling or seeing things that are not there (hallucinations). °? Tremors. °? Rapid heart rate. °? Rapid breathing. °? High blood pressure. °· Drinking to avoid symptoms of withdrawal. °How is this diagnosed? °This condition is diagnosed with an assessment. Your health care provider may start the assessment by asking three or four questions about your drinking. °Your health care provider may perform a physical exam or do lab tests to see if you have physical problems resulting from alcohol use. She or he may refer you to a mental health professional for evaluation. °How is this treated? °Some people with alcohol use disorder are able to reduce their alcohol use to low-risk levels. Others need to completely quit drinking alcohol. When necessary, mental health professionals with specialized training in substance use treatment can help. Your health care provider can help you decide how severe your alcohol use disorder is and what type of treatment you need. The following forms of treatment are available: °· Detoxification. Detoxification involves quitting drinking and using prescription medicines within the first week to help lessen withdrawal symptoms. This treatment is important for people who have had withdrawal symptoms before and for heavy drinkers   who are likely to have withdrawal symptoms. Alcohol withdrawal can be dangerous, and in severe cases, it can cause death. Detoxification may be provided in a home, community, or primary care setting, or in a hospital or substance use  treatment facility. °· Counseling. This treatment is also called talk therapy. It is provided by substance use treatment counselors. A counselor can address the reasons you use alcohol and suggest ways to keep you from drinking again or to prevent problem drinking. The goals of talk therapy are to: °? Find healthy activities and ways for you to cope with stress. °? Identify and avoid the things that trigger your alcohol use. °? Help you learn how to handle cravings. °· Medicines. Medicines can help treat alcohol use disorder by: °? Decreasing alcohol cravings. °? Decreasing the positive feeling you have when you drink alcohol. °? Causing an uncomfortable physical reaction when you drink alcohol (aversion therapy). °· Support groups. Support groups are led by people who have quit drinking. They provide emotional support, advice, and guidance. °These forms of treatment are often combined. Some people with this condition benefit from a combination of treatments provided by specialized substance use treatment centers. °Follow these instructions at home: °· Take over-the-counter and prescription medicines only as told by your health care provider. °· Check with your health care provider before starting any new medicines. °· Ask friends and family members not to offer you alcohol. °· Avoid situations where alcohol is served, including gatherings where others are drinking alcohol. °· Create a plan for what to do when you are tempted to use alcohol. °· Find hobbies or activities that you enjoy that do not include alcohol. °· Keep all follow-up visits as told by your health care provider. This is important. °How is this prevented? °· If you drink, limit alcohol intake to no more than 1 drink a day for nonpregnant women and 2 drinks a day for men. One drink equals 12 oz of beer, 5 oz of wine, or 1½ oz of hard liquor. °· If you have a mental health condition, get treatment and support. °· Do not give alcohol to  adolescents. °· If you are an adolescent: °? Do not drink alcohol. °? Do not be afraid to say no if someone offers you alcohol. Speak up about why you do not want to drink. You can be a positive role model for your friends and set a good example for those around you by not drinking alcohol. °? If your friends drink, spend time with others who do not drink alcohol. Make new friends who do not use alcohol. °? Find healthy ways to manage stress and emotions, such as meditation or deep breathing, exercise, spending time in nature, listening to music, or talking with a trusted friend or family member. °Contact a health care provider if: °· You are not able to take your medicines as told. °· Your symptoms get worse. °· You return to drinking alcohol (relapse) and your symptoms get worse. °Get help right away if: °· You have thoughts about hurting yourself or others. °If you ever feel like you may hurt yourself or others, or have thoughts about taking your own life, get help right away. You can go to your nearest emergency department or call: °· Your local emergency services (911 in the [place] °· A suicide crisis helpline, such as the National Suicide Prevention Lifeline at 1-800-273-8255. This is open 24 hours a day. °Summary °· Alcohol use disorder is when your drinking disrupts your daily   life. When you have this condition, you drink too much alcohol and you cannot control your drinking.  Treatment may include detoxification, counseling, medicine, and support groups.  Ask friends and family members not to offer you alcohol. Avoid situations where alcohol is served.  Get help right away if you have thoughts about hurting yourself or others. This information is not intended to replace advice given to you by your health care provider. Make sure you discuss any questions you have with your health care provider. Document Released: 10/16/2004 Document Revised: 08/21/2017 Document Reviewed: 06/05/2016 Elsevier Patient  Education  2020 [employer]  Asthma, Adult  Asthma is a long-term (chronic) condition that causes recurrent episodes in which the airways become tight and narrow. The airways are the passages that lead from the nose and mouth down into the lungs. Asthma episodes, also called asthma attacks, can cause coughing, wheezing, shortness of breath, and chest pain. The airways can also fill with mucus. During an attack, it can be difficult to breathe. Asthma attacks can range from minor to life threatening. Asthma cannot be cured, but medicines and lifestyle changes can help control it and treat acute attacks. What are the causes? This condition is believed to be caused by inherited (genetic) and environmental factors, but its exact cause is not known. There are many things that can bring on an asthma attack or make asthma symptoms worse (triggers). Asthma triggers are different for each person. Common triggers include:  Mold.  Dust.  Cigarette smoke.  Cockroaches.  Things that can cause allergy symptoms (allergens), such as animal dander or pollen from trees or grass.  Air pollutants such as household cleaners, wood smoke, smog, or Therapist, occupationalchemical odors.  Cold air, weather changes, and winds (which increase molds and pollen in the air).  Strong emotional expressions such as crying or laughing hard.  Stress.  Certain medicines (such as aspirin) or types of medicines (such as beta-blockers).  Sulfites in foods and drinks. Foods and drinks that may contain sulfites include dried fruit, potato chips, and sparkling grape juice.  Infections or inflammatory conditions such as the flu, a cold, or inflammation of the nasal membranes (rhinitis).  Gastroesophageal reflux disease (GERD).  Exercise or strenuous activity. What are the signs or symptoms? Symptoms of this condition may occur right after asthma is triggered or many hours later. Symptoms include:  Wheezing. This can sound like whistling  when you breathe.  Excessive nighttime or early morning coughing.  Frequent or severe coughing with a common cold.  Chest tightness.  Shortness of breath.  Tiredness (fatigue) with minimal activity. How is this diagnosed? This condition is diagnosed based on:  Your medical history.  A physical exam.  Tests, which may include: ? Lung function studies and pulmonary studies (spirometry). These tests can evaluate the flow of air in your lungs. ? Allergy tests. ? Imaging tests, such as X-rays. How is this treated? There is no cure for this condition, but treatment can help control your symptoms. Treatment for asthma usually involves:  Identifying and avoiding your asthma triggers.  Using medicines to control your symptoms. Generally, two types of medicines are used to treat asthma: ? Controller medicines. These help prevent asthma symptoms from occurring. They are usually taken every day. ? Fast-acting reliever or rescue medicines. These quickly relieve asthma symptoms by widening the narrow and tight airways. They are used as needed and provide short-term relief.  Using supplemental oxygen. This may be needed during a severe episode.  Using other  medicines, such as: ? Allergy medicines, such as antihistamines, if your asthma attacks are triggered by allergens. ? Immune medicines (immunomodulators). These are medicines that help control the immune system.  Creating an asthma action plan. An asthma action plan is a written plan for managing and treating your asthma attacks. This plan includes: ? A list of your asthma triggers and how to avoid them. ? Information about when medicines should be taken and when their dosage should be changed. ? Instructions about using a device called a peak flow meter. A peak flow meter measures how well the lungs are working and the severity of your asthma. It helps you monitor your condition. Follow these instructions at home: Controlling your home  environment Control your home environment in the following ways to help avoid triggers and prevent asthma attacks:  Change your heating and air conditioning filter regularly.  Limit your use of fireplaces and wood stoves.  Get rid of pests (such as roaches and mice) and their droppings.  Throw away plants if you see mold on them.  Clean floors and dust surfaces regularly. Use unscented cleaning products.  Try to have someone else vacuum for you regularly. Stay out of rooms while they are being vacuumed and for a short while afterward. If you vacuum, use a dust mask from a hardware store, a double-layered or microfilter vacuum cleaner bag, or a vacuum cleaner with a HEPA filter.  Replace carpet with wood, tile, or vinyl flooring. Carpet can trap dander and dust.  Use allergy-proof pillows, mattress covers, and box spring covers.  Keep your bedroom a trigger-free room.  Avoid pets and keep windows closed when allergens are in the air.  Wash beddings every week in hot water and dry them in a dryer.  Use blankets that are made of polyester or cotton.  Clean bathrooms and kitchens with bleach. If possible, have someone repaint the walls in these rooms with mold-resistant paint. Stay out of the rooms that are being cleaned and painted.  Wash your hands often with soap and water. If soap and water are not available, use hand sanitizer.  Do not allow anyone to smoke in your home. General instructions  Take over-the-counter and prescription medicines only as told by your health care provider. ? Speak with your health care provider if you have questions about how or when to take the medicines. ? Make note if you are requiring more frequent dosages.  Do not use any products that contain nicotine or tobacco, such as cigarettes and e-cigarettes. If you need help quitting, ask your health care provider. Also, avoid being exposed to secondhand smoke.  Use a peak flow meter as told by your  health care provider. Record and keep track of the readings.  Understand and use the asthma action plan to help minimize, or stop an asthma attack, without needing to seek medical care.  Make sure you stay up to date on your yearly vaccinations as told by your health care provider. This may include vaccines for the flu and pneumonia.  Avoid outdoor activities when allergen counts are high and when air quality is low.  Wear a ski mask that covers your nose and mouth during outdoor winter activities. Exercise indoors on cold days if you can.  Warm up before exercising, and take time for a cool-down period after exercise.  Keep all follow-up visits as told by your health care provider. This is important. Where to find more information  For information about asthma, turn  to the Centers for Disease Control and Prevention at http://www.clark.net/.htm  For air quality information, turn to AirNow at WeightRating.nl Contact a health care provider if:  You have wheezing, shortness of breath, or a cough even while you are taking medicine to prevent attacks.  The mucus you cough up (sputum) is thicker than usual.  Your sputum changes from clear or white to yellow, green, gray, or bloody.  Your medicines are causing side effects, such as a rash, itching, swelling, or trouble breathing.  You need to use a reliever medicine more than 2-3 times a week.  Your peak flow reading is still at 50-79% of your personal best after following your action plan for 1 hour.  You have a fever. Get help right away if:  You are getting worse and do not respond to treatment during an asthma attack.  You are short of breath when at rest or when doing very little physical activity.  You have difficulty eating, drinking, or talking.  You have chest pain or tightness.  You develop a fast heartbeat or palpitations.  You have a bluish color to your lips or fingernails.  You are light-headed or dizzy, or  you faint.  Your peak flow reading is less than 50% of your personal best.  You feel too tired to breathe normally. Summary  Asthma is a long-term (chronic) condition that causes recurrent episodes in which the airways become tight and narrow. These episodes can cause coughing, wheezing, shortness of breath, and chest pain.  Asthma cannot be cured, but medicines and lifestyle changes can help control it and treat acute attacks.  Make sure you understand how to avoid triggers and how and when to use your medicines.  Asthma attacks can range from minor to life threatening. Get help right away if you have an asthma attack and do not respond to treatment with your usual rescue medicines. This information is not intended to replace advice given to you by your health care provider. Make sure you discuss any questions you have with your health care provider. Document Released: 09/08/2005 Document Revised: 11/11/2018 Document Reviewed: 10/13/2016 Elsevier Patient Education  2020 [employer].  Steps to Quit Smoking Smoking tobacco is the leading cause of preventable death. It can affect almost every organ in the body. Smoking puts you and those around you at risk for developing many serious chronic diseases. Quitting smoking can be difficult, but it is one of the best things that you can do for your health. It is never too late to quit. How do I get ready to quit? When you decide to quit smoking, create a plan to help you succeed. Before you quit:  Pick a date to quit. Set a date within the next 2 weeks to give you time to prepare.  Write down the reasons why you are quitting. Keep this list in places where you will see it often.  Tell your family, friends, and co-workers that you are quitting. Support from your loved ones can make quitting easier.  Talk with your health care provider about your options for quitting smoking.  Find out what treatment options are covered by your health  insurance.  Identify people, places, things, and activities that make you want to smoke (triggers). Avoid them. What first steps can I take to quit smoking?  Throw away all cigarettes at home, at work, and in your car.  Throw away smoking accessories, such as Scientist, research (medical).  Clean your car. Make sure to empty  the ashtray.  Clean your home, including curtains and carpets. What strategies can I use to quit smoking? Talk with your health care provider about combining strategies, such as taking medicines while you are also receiving in-person counseling. Using these two strategies together makes you more likely to succeed in quitting than if you used either strategy on its own.  If you are pregnant or breastfeeding, talk with your health care provider about finding counseling or other support strategies to quit smoking. Do not take medicine to help you quit smoking unless your health care provider tells you to do so. To quit smoking: Quit right away  Quit smoking completely, instead of gradually reducing how much you smoke over a period of time. Research shows that stopping smoking right away is more successful than gradually quitting.  Attend in-person counseling to help you build problem-solving skills. You are more likely to succeed in quitting if you attend counseling sessions regularly. Even short sessions of 10 minutes can be effective. Take medicine You may take medicines to help you quit smoking. Some medicines require a prescription and some you can purchase over-the-counter. Medicines may have nicotine in them to replace the nicotine in cigarettes. Medicines may:  Help to stop cravings.  Help to relieve withdrawal symptoms. Your health care provider may recommend:  Nicotine patches, gum, or lozenges.  Nicotine inhalers or sprays.  Non-nicotine medicine that is taken by mouth. Find resources Find resources and support systems that can help you to quit smoking and  remain smoke-free after you quit. These resources are most helpful when you use them often. They include:  Online chats with a Veterinary surgeoncounselor.  Telephone quitlines.  Printed Materials engineerself-help materials.  Support groups or group counseling.  Text messaging programs.  Mobile phone apps or applications. Use apps that can help you stick to your quit plan by providing reminders, tips, and encouragement. There are many free apps for mobile devices as well as websites. Examples include Quit Guide from the [employer] and smokefree.gov What things can I do to make it easier to quit?   Reach out to your family and friends for support and encouragement. Call telephone quitlines (1-800-QUIT-NOW), reach out to support groups, or work with a counselor for support.  Ask people who smoke to avoid smoking around you.  Avoid places that trigger you to smoke, such as bars, parties, or smoke-break areas at work.  Spend time with people who do not smoke.  Lessen the stress in your life. Stress can be a smoking trigger for some people. To lessen stress, try: ? Exercising regularly. ? Doing deep-breathing exercises. ? Doing yoga. ? Meditating. ? Performing a body scan. This involves closing your eyes, scanning your body from head to toe, and noticing which parts of your body are particularly tense. Try to relax the muscles in those areas. How will I feel when I quit smoking? Day 1 to 3 weeks Within the first 24 hours of quitting smoking, you may start to feel withdrawal symptoms. These symptoms are usually most noticeable 2-3 days after quitting, but they usually do not last for more than 2-3 weeks. You may experience these symptoms:  Mood swings.  Restlessness, anxiety, or irritability.  Trouble concentrating.  Dizziness.  Strong cravings for sugary foods and nicotine.  Mild weight gain.  Constipation.  Nausea.  Coughing or a sore throat.  Changes in how the medicines that you take for unrelated issues  work in your body.  Depression.  Trouble sleeping (insomnia). Week 3  and afterward After the first 2-3 weeks of quitting, you may start to notice more positive results, such as:  Improved sense of smell and taste.  Decreased coughing and sore throat.  Slower heart rate.  Lower blood pressure.  Clearer skin.  The ability to breathe more easily.  Fewer sick days. Quitting smoking can be very challenging. Do not get discouraged if you are not successful the first time. Some people need to make many attempts to quit before they achieve long-term success. Do your best to stick to your quit plan, and talk with your health care provider if you have any questions or concerns. Summary  Smoking tobacco is the leading cause of preventable death. Quitting smoking is one of the best things that you can do for your health.  When you decide to quit smoking, create a plan to help you succeed.  Quit smoking right away, not slowly over a period of time.  When you start quitting, seek help from your health care provider, family, or friends. This information is not intended to replace advice given to you by your health care provider. Make sure you discuss any questions you have with your health care provider. Document Released: 09/02/2001 Document Revised: 11/26/2018 Document Reviewed: 11/27/2018 Elsevier Patient Education  2020 [employer].

## 2019-06-29 ENCOUNTER — Other Ambulatory Visit: Payer: Self-pay | Admitting: Family Medicine

## 2019-06-29 DIAGNOSIS — R079 Chest pain, unspecified: Secondary | ICD-10-CM

## 2019-07-05 ENCOUNTER — Other Ambulatory Visit: Payer: Self-pay | Admitting: Family Medicine

## 2019-07-05 DIAGNOSIS — J4541 Moderate persistent asthma with (acute) exacerbation: Secondary | ICD-10-CM

## 2019-07-05 DIAGNOSIS — J449 Chronic obstructive pulmonary disease, unspecified: Secondary | ICD-10-CM | POA: Diagnosis not present

## 2019-07-05 DIAGNOSIS — J45909 Unspecified asthma, uncomplicated: Secondary | ICD-10-CM | POA: Diagnosis not present

## 2019-07-05 MED ORDER — FLUTICASONE-SALMETEROL 250-50 MCG/DOSE IN AEPB: 1.0000 | INHALATION_SPRAY | Freq: Two times a day (BID) | RESPIRATORY_TRACT | 3 refills | Status: DC

## 2019-07-05 NOTE — Telephone Encounter (Signed)
Requested medication (s) are due for refill today: yes  Requested medication (s) are on the active medication list: yes  Last refill:  06/17/2019  Future visit scheduled:no  Notes to clinic:  Refill cannot be delegated Advair looks like it was sent with refills already   Requested Prescriptions  Pending Prescriptions Disp Refills   Fluticasone-Salmeterol (ADVAIR DISKUS) 250-50 MCG/DOSE AEPB 1 each 3    Sig: Inhale 1 puff into the lungs 2 (two) times daily.     There is no refill protocol information for this order     predniSONE (STERAPRED UNI-PAK 48 TAB) 10 MG (48) TBPK tablet 48 tablet 0    Sig: Pharm to instruct a 12 day dose pack to start tomorrow.     Not Delegated - Endocrinology:  Oral Corticosteroids Failed - 07/05/2019 10:53 AM      Failed - This refill cannot be delegated      Passed - Last BP in normal range    BP Readings from Last 1 Encounters:  06/17/19 130/70         Passed - Valid encounter within last 6 months    Recent Outpatient Visits          2 weeks ago Tobacco abuse   [facility], Mortimer Fries, MD   1 month ago Need for influenza vaccination   [facility], Mortimer Fries, MD   7 months ago Depression with anxiety   [facility], Mortimer Fries, MD   10 months ago Essential hypertension   [facility] [name], MD   11 months ago Essential hypertension   [facility] [address] Mortimer Fries, MD

## 2019-07-05 NOTE — Telephone Encounter (Signed)
Medication Refill - Medication: Fluticasone-Salmeterol predniSONE (STERAPRED UNI-PAK 48 TAB) 10 MG (48) TBPK tablet    Preferred Pharmacy (with phone number or street name):  [facility] [address] [place] [address] [place] [address]  Phone: [phone] Fax: [phone]     Agent: Please be advised that RX refills may take up to 3 business days. We ask that you follow-up with your pharmacy.

## 2019-07-08 DIAGNOSIS — G4733 Obstructive sleep apnea (adult) (pediatric): Secondary | ICD-10-CM | POA: Diagnosis not present

## 2019-07-13 ENCOUNTER — Telehealth: Payer: Self-pay | Admitting: Internal Medicine

## 2019-07-13 NOTE — Telephone Encounter (Signed)
[employer] called 07/13/19 7:25 pm pt having sob rec go to ED   [place]

## 2019-07-19 ENCOUNTER — Ambulatory Visit (INDEPENDENT_AMBULATORY_CARE_PROVIDER_SITE_OTHER): Payer: Medicare Other | Admitting: Family Medicine

## 2019-07-19 ENCOUNTER — Other Ambulatory Visit: Payer: Self-pay

## 2019-07-19 ENCOUNTER — Ambulatory Visit (INDEPENDENT_AMBULATORY_CARE_PROVIDER_SITE_OTHER): Payer: Medicare Other

## 2019-07-19 ENCOUNTER — Encounter: Payer: Self-pay | Admitting: Family Medicine

## 2019-07-19 ENCOUNTER — Ambulatory Visit: Payer: Self-pay | Admitting: *Deleted

## 2019-07-19 VITALS — BP 170/84 | HR 87 | Temp 96.4°F | Ht 72.0 in | Wt 170.6 lb

## 2019-07-19 DIAGNOSIS — R0602 Shortness of breath: Secondary | ICD-10-CM | POA: Diagnosis not present

## 2019-07-19 DIAGNOSIS — I480 Paroxysmal atrial fibrillation: Secondary | ICD-10-CM

## 2019-07-19 DIAGNOSIS — I10 Essential (primary) hypertension: Secondary | ICD-10-CM

## 2019-07-19 DIAGNOSIS — K5909 Other constipation: Secondary | ICD-10-CM

## 2019-07-19 MED ORDER — AMLODIPINE BESYLATE 10 MG PO TABS: 10.0000 mg | ORAL_TABLET | Freq: Every day | ORAL | 1 refills | Status: DC

## 2019-07-19 NOTE — Assessment & Plan Note (Signed)
-  Has miralax at home, recommend he add this on and take consistently.

## 2019-07-19 NOTE — Patient Instructions (Signed)
Increase your amlodipine to 10mg  each day.  Continue other medications Follow up with Dr. Ethelene Hal in 1-2 weeks

## 2019-07-19 NOTE — Telephone Encounter (Signed)
BP has been elevated this week and COPD has been worse as well. Patient states over the past month his SOB has gotten worse and he is using his inhaler more than prescribed. Patient also states his insurance company monitors his BP and it has been elevated all week.  Reason for Disposition . [1] Longstanding difficulty breathing (e.g., CHF, COPD, emphysema) AND [2] WORSE than normal  Answer Assessment - Initial Assessment Questions 1. BLOOD PRESSURE: "What is the blood pressure?" "Did you take at least two measurements 5 minutes apart?"     [facility] has service and monitors BP- 178/102 for the past week, 138/73 P 66 2. ONSET: "When did you take your blood pressure?"     Patient has BP monitored daily through insurance 3. HOW: "How did you obtain the blood pressure?" (e.g., visiting nurse, automatic home BP monitor)     blue tooth BP 4. HISTORY: "Do you have a history of high blood pressure?"     yes 5. MEDICATIONS: "Are you taking any medications for blood pressure?" "Have you missed any doses recently?"     Yes, no missed doses 6. OTHER SYMPTOMS: "Do you have any symptoms?" (e.g., headache, chest pain, blurred vision, difficulty breathing, weakness)     SOB- on medication for COPD/asthma 7. PREGNANCY: "Is there any chance you are pregnant?" "When was your last menstrual period?"     n/a  Answer Assessment - Initial Assessment Questions 1. RESPIRATORY STATUS: "Describe your breathing?" (e.g., wheezing, shortness of breath, unable to speak, severe coughing)      SOB with exertion- feels drainage is in his throat into the chest, patient is having to use inhaler more than normal. 2. ONSET: "When did this breathing problem begin?"      Chronic- patient states his breathing has gotten worse over the past month 3. PATTERN "Does the difficult breathing come and go, or has it been constant since it started?"      Comes and goes 4. SEVERITY: "How bad is your breathing?" (e.g., mild, moderate, severe)     - MILD: No SOB at rest, mild SOB with walking, speaks normally in sentences, can lay down, no retractions, pulse < 100.    - MODERATE: SOB at rest, SOB with minimal exertion and prefers to sit, cannot lie down flat, speaks in phrases, mild retractions, audible wheezing, pulse 100-120.    - SEVERE: Very SOB at rest, speaks in single words, struggling to breathe, sitting hunched forward, retractions, pulse > 120      Mild/moderate 5. RECURRENT SYMPTOM: "Have you had difficulty breathing before?" If so, ask: "When was the last time?" and "What happened that time?"      COPD,asthma 6. CARDIAC HISTORY: "Do you have any history of heart disease?" (e.g., heart attack, angina, bypass surgery, angioplasty)      yes 7. LUNG HISTORY: "Do you have any history of lung disease?"  (e.g., pulmonary embolus, asthma, emphysema)     Asthma,COPD 8. CAUSE: "What do you think is causing the breathing problem?"      Chronic breathing problem 9. OTHER SYMPTOMS: "Do you have any other symptoms? (e.g., dizziness, runny nose, cough, chest pain, fever)     Tightness during episodes 10. PREGNANCY: "Is there any chance you are pregnant?" "When was your last menstrual period?"       n/a 11. TRAVEL: "Have you traveled out of the country in the last month?" (e.g., travel history, exposures)       n/a  Protocols used: BREATHING  DIFFICULTY-A-AH, HIGH BLOOD PRESSURE-A-AH

## 2019-07-19 NOTE — Progress Notes (Signed)
Please let him know that his CXR is normal.  Thanks!

## 2019-07-19 NOTE — Progress Notes (Signed)
Kenneth [name] - 58 y.o. male MRN [id number]  Date of birth: [birth year]  Subjective Chief Complaint  Patient presents with  . Hypertension    patients been having elevated blood pressure and now he's having shortness of breath    HPI Kenneth [name] is a 58 y.o. male with history of a. Fib, CHF, T2DM, COPD and HTN here today for elevated blood pressure and shortness of breath.  He reports that he has felt a little more short of breath over the past couple of days and noticed his BP was running high.  He reports that he often feels more short of breath when he is constipated.  He reports that he feels anxious because of his constipation and now his elevated BP.  He denies increased cough, wheezing, chest pain, palpitations, dizziness, headache, vision changes, fever, nausea, vomiting, diarrhea.  He has not had increased swelling or weight change.    ROS:  A comprehensive ROS was completed and negative except as noted per HPI  Allergies  Allergen Reactions  . Lisinopril Swelling  . Tomato Rash    Past Medical History:  Diagnosis Date  . Arthritis   . Asthma   . Atrial fibrillation (HCC)   . CHF (congestive heart failure) (HCC)   . Depression   . Diabetes mellitus without complication (HCC)   . Emphysema of lung (HCC)   . Heart murmur   . Hyperlipidemia   . Hypertension     History reviewed. No pertinent surgical history.  Social History   Socioeconomic History  . Marital status: Legally Separated    Spouse name: Not on file  . Number of children: Not on file  . Years of education: Not on file  . Highest education level: Not on file  Occupational History  . Not on file  Social Needs  . Financial resource strain: Not on file  . Food insecurity    Worry: Not on file    Inability: Not on file  . Transportation needs    Medical: Not on file    Non-medical: Not on file  Tobacco Use  . Smoking status: Current Some Day Smoker    Packs/day: 0.25    Types: Cigarettes  .  Smokeless tobacco: Never Used  Substance and Sexual Activity  . Alcohol use: Yes    Comment: Drinks up to six beers around a game  . Drug use: Yes    Types: Marijuana    Comment: occ marijuana  . Sexual activity: Yes    Partners: Female  Lifestyle  . Physical activity    Days per week: Not on file    Minutes per session: Not on file  . Stress: Not on file  Relationships  . Social Musician on phone: Not on file    Gets together: Not on file    Attends religious service: Not on file    Active member of club or organization: Not on file    Attends meetings of clubs or organizations: Not on file    Relationship status: Not on file  Other Topics Concern  . Not on file  Social History Narrative  . Not on file    Family History  Problem Relation Age of Onset  . Diabetes Sister   . Coronary artery disease Brother     Health Maintenance  Topic Date Due  . FOOT EXAM  07/12/1971  . COLONOSCOPY  09/23/2019  . HEMOGLOBIN A1C  11/20/2019  . OPHTHALMOLOGY EXAM  05/12/2020  .  TETANUS/TDAP  08/01/2025  . INFLUENZA VACCINE  Completed  . PNEUMOCOCCAL POLYSACCHARIDE VACCINE AGE 62-64 HIGH RISK  Completed  . Hepatitis C Screening  Completed  . HIV Screening  Completed    ----------------------------------------------------------------------------------------------------------------------------------------------------------------------------------------------------------------- Physical Exam BP (!) 170/84   Pulse 87   Temp (!) 96.4 F (35.8 C) (Tympanic)   Ht 6' (1.829 m)   Wt 170 lb 9.6 oz (77.4 kg)   SpO2 97%   BMI 23.14 kg/m   Physical Exam Constitutional:      Appearance: Normal appearance.  HENT:     Head: Normocephalic and atraumatic.  Eyes:     General: No scleral icterus. Neck:     Musculoskeletal: Neck supple.  Cardiovascular:     Rate and Rhythm: Normal rate and regular rhythm.  Pulmonary:     Effort: Pulmonary effort is normal.     Breath  sounds: Normal breath sounds.  Skin:    General: Skin is warm and dry.  Neurological:     General: No focal deficit present.     Mental Status: He is alert.  Psychiatric:        Mood and Affect: Mood normal.        Behavior: Behavior normal.    EKG: NSR with ST-T changes.    ------------------------------------------------------------------------------------------------------------------------------------------------------------------------------------------------------------------- Assessment and Plan  Shortness of breath -States worsened when he is more constipated and feels more anxious which could be causing his shortness of breath.  -Pulm exam is benign today and EKG is normal.  CXR ordered.   Essential hypertension -BP elevated today. -Increase amlodipine to 10mg  daily. -Recommend low salt diet.  -F/u w/ Dr. Ethelene Hal in 1-2 weeks.  -Discussed red flags that would prompt him to seek emergency care including chest pain, worsening headache, weakness/slurred speech, or worsening dyspnea.   Chronic constipation -Has miralax at home, recommend he add this on and take consistently.

## 2019-07-19 NOTE — Assessment & Plan Note (Signed)
-  BP elevated today. -Increase amlodipine to 10mg  daily. -Recommend low salt diet.  -F/u w/ Dr. Ethelene Hal in 1-2 weeks.  -Discussed red flags that would prompt him to seek emergency care including chest pain, worsening headache, weakness/slurred speech, or worsening dyspnea.

## 2019-07-19 NOTE — Assessment & Plan Note (Signed)
-  States worsened when he is more constipated and feels more anxious which could be causing his shortness of breath.  -Pulm exam is benign today and EKG is normal.  CXR ordered.

## 2019-07-21 DIAGNOSIS — J45909 Unspecified asthma, uncomplicated: Secondary | ICD-10-CM | POA: Diagnosis not present

## 2019-07-21 DIAGNOSIS — J449 Chronic obstructive pulmonary disease, unspecified: Secondary | ICD-10-CM | POA: Diagnosis not present

## 2019-07-31 ENCOUNTER — Other Ambulatory Visit: Payer: Self-pay | Admitting: Cardiology

## 2019-08-04 ENCOUNTER — Emergency Department (HOSPITAL_COMMUNITY)
Admission: EM | Admit: 2019-08-04 | Discharge: 2019-08-05 | Disposition: A | Discharge status: Home / Self Care | Payer: Medicare Other | Attending: Emergency Medicine | Admitting: Emergency Medicine

## 2019-08-04 ENCOUNTER — Emergency Department (HOSPITAL_COMMUNITY): Payer: Medicare Other

## 2019-08-04 ENCOUNTER — Encounter (HOSPITAL_COMMUNITY): Payer: Self-pay | Admitting: Emergency Medicine

## 2019-08-04 ENCOUNTER — Other Ambulatory Visit: Payer: Self-pay

## 2019-08-04 DIAGNOSIS — J441 Chronic obstructive pulmonary disease with (acute) exacerbation: Secondary | ICD-10-CM

## 2019-08-04 DIAGNOSIS — E119 Type 2 diabetes mellitus without complications: Secondary | ICD-10-CM | POA: Diagnosis not present

## 2019-08-04 DIAGNOSIS — I10 Essential (primary) hypertension: Secondary | ICD-10-CM | POA: Diagnosis not present

## 2019-08-04 DIAGNOSIS — R0602 Shortness of breath: Secondary | ICD-10-CM | POA: Diagnosis not present

## 2019-08-04 DIAGNOSIS — Z20828 Contact with and (suspected) exposure to other viral communicable diseases: Secondary | ICD-10-CM | POA: Insufficient documentation

## 2019-08-04 DIAGNOSIS — F1290 Cannabis use, unspecified, uncomplicated: Secondary | ICD-10-CM | POA: Diagnosis not present

## 2019-08-04 DIAGNOSIS — Z79899 Other long term (current) drug therapy: Secondary | ICD-10-CM | POA: Insufficient documentation

## 2019-08-04 DIAGNOSIS — Z7901 Long term (current) use of anticoagulants: Secondary | ICD-10-CM | POA: Diagnosis not present

## 2019-08-04 DIAGNOSIS — F17210 Nicotine dependence, cigarettes, uncomplicated: Secondary | ICD-10-CM | POA: Insufficient documentation

## 2019-08-04 DIAGNOSIS — Z743 Need for continuous supervision: Secondary | ICD-10-CM | POA: Diagnosis not present

## 2019-08-04 DIAGNOSIS — I4891 Unspecified atrial fibrillation: Secondary | ICD-10-CM | POA: Insufficient documentation

## 2019-08-04 DIAGNOSIS — Z794 Long term (current) use of insulin: Secondary | ICD-10-CM | POA: Insufficient documentation

## 2019-08-04 LAB — CBC WITH DIFFERENTIAL/PLATELET
Abs Immature Granulocytes: 0.04 10*3/uL (ref 0.00–0.07)
Basophils Absolute: 0 10*3/uL (ref 0.0–0.1)
Basophils Relative: 1 %
Eosinophils Absolute: 0.1 10*3/uL (ref 0.0–0.5)
Eosinophils Relative: 1 %
HCT: 41.8 % (ref 39.0–52.0)
Hemoglobin: 13.5 g/dL (ref 13.0–17.0)
Immature Granulocytes: 1 %
Lymphocytes Relative: 12 %
Lymphs Abs: 1 10*3/uL (ref 0.7–4.0)
MCH: 30.4 pg (ref 26.0–34.0)
MCHC: 32.3 g/dL (ref 30.0–36.0)
MCV: 94.1 fL (ref 80.0–100.0)
Monocytes Absolute: 0.8 10*3/uL (ref 0.1–1.0)
Monocytes Relative: 10 %
Neutro Abs: 6.3 10*3/uL (ref 1.7–7.7)
Neutrophils Relative %: 75 %
Platelets: 256 10*3/uL (ref 150–400)
RBC: 4.44 MIL/uL (ref 4.22–5.81)
RDW: 12.5 % (ref 11.5–15.5)
WBC: 8.3 10*3/uL (ref 4.0–10.5)
nRBC: 0 % (ref 0.0–0.2)

## 2019-08-04 LAB — COMPREHENSIVE METABOLIC PANEL
ALT: 13 U/L (ref 0–44)
AST: 24 U/L (ref 15–41)
Albumin: 3.7 g/dL (ref 3.5–5.0)
Alkaline Phosphatase: 53 U/L (ref 38–126)
Anion gap: 13 (ref 5–15)
BUN: 7 mg/dL (ref 6–20)
CO2: 28 mmol/L (ref 22–32)
Calcium: 8.8 mg/dL — ABNORMAL LOW (ref 8.9–10.3)
Chloride: 95 mmol/L — ABNORMAL LOW (ref 98–111)
Creatinine, Ser: 1.24 mg/dL (ref 0.61–1.24)
GFR calc Af Amer: >60 mL/min (ref >60)
GFR calc non Af Amer: >60 mL/min (ref >60)
Glucose, Bld: 129 mg/dL — ABNORMAL HIGH (ref 70–99)
Potassium: 3.7 mmol/L (ref 3.5–5.1)
Sodium: 136 mmol/L (ref 135–145)
Total Bilirubin: 0.9 mg/dL (ref 0.3–1.2)
Total Protein: 6.3 g/dL — ABNORMAL LOW (ref 6.5–8.1)

## 2019-08-04 LAB — POCT I-STAT EG7
Acid-Base Excess: 8 mmol/L — ABNORMAL HIGH (ref 0.0–2.0)
Bicarbonate: 33.6 mmol/L — ABNORMAL HIGH (ref 20.0–28.0)
Calcium, Ion: 1.07 mmol/L — ABNORMAL LOW (ref 1.15–1.40)
HCT: 42 % (ref 39.0–52.0)
Hemoglobin: 14.3 g/dL (ref 13.0–17.0)
O2 Saturation: 99 %
Potassium: 3.6 mmol/L (ref 3.5–5.1)
Sodium: 136 mmol/L (ref 135–145)
TCO2: 35 mmol/L — ABNORMAL HIGH (ref 22–32)
pCO2, Ven: 46.7 mmHg (ref 44.0–60.0)
pH, Ven: 7.465 — ABNORMAL HIGH (ref 7.250–7.430)
pO2, Ven: 117 mmHg — ABNORMAL HIGH (ref 32.0–45.0)

## 2019-08-04 MED ORDER — METHYLPREDNISOLONE SODIUM SUCC 125 MG IJ SOLR
125.0000 mg | Freq: Once | INTRAMUSCULAR | Status: AC
  Administered 2019-08-04: 125 mg via INTRAVENOUS
  Filled 2019-08-04: qty 2

## 2019-08-04 MED ORDER — IPRATROPIUM BROMIDE HFA 17 MCG/ACT IN AERS
2.0000 | INHALATION_SPRAY | Freq: Once | RESPIRATORY_TRACT | Status: AC
  Administered 2019-08-04: 2 via RESPIRATORY_TRACT
  Filled 2019-08-04: qty 12.9

## 2019-08-04 MED ORDER — ALBUTEROL SULFATE HFA 108 (90 BASE) MCG/ACT IN AERS
8.0000 | INHALATION_SPRAY | Freq: Once | RESPIRATORY_TRACT | Status: AC
  Administered 2019-08-04: 8 via RESPIRATORY_TRACT
  Filled 2019-08-04: qty 6.7

## 2019-08-04 NOTE — ED Notes (Signed)
Radiology at bedside

## 2019-08-04 NOTE — ED Triage Notes (Signed)
Pt BIB [facility] from home for SOB for 2 hours. Hx of COPD and CHF. Pt attempted an albuterol neb for 20 min with no success. Upon EMS arrival, pt was diaphoretic, tripod, SOB, placed on NRB 6L O2 sat at 100%. Initial BP 220/110, now 146/84, otherwise VSS.

## 2019-08-04 NOTE — ED Provider Notes (Signed)
[facility] EMERGENCY DEPARTMENT Provider Note   CSN: [id number] Arrival date & time: 08/04/19  2213     History   Chief Complaint Chief Complaint  Patient presents with  . Shortness of Breath    HPI Kenneth [name] is a 58 y.o. male with history of hypertension, hyperlipidemia, diabetes, CHF, COPD who presents with shortness of breath worse over the past 6 hours.  Patient reports he was having a little bit over the past few days, but it suddenly got much worse.  He attempted albuterol nebulizer for 20 minutes at home without success.  He denies any new leg pain or swelling.  He reports this feels typical of his COPD exacerbations.  Patient has not been on any steroids recently.  He reports he was on azithromycin 1 month ago.  Patient was diaphoretic, tripoding on EMS arrival and was placed on nonrebreather 6 L.  Patient had initial blood pressure of 220/110, but has calmed down.  Patient is a lot more comfortable on nonrebreather.  Patient denies any chest pain.  He reports a little bit of intermittent cough and has felt sweats like he has had a fever, but did not take it at home.  He denies any abdominal pain, nausea, vomiting.  He denies any known sick contacts.     HPI  Past Medical History:  Diagnosis Date  . Arthritis   . Asthma   . Atrial fibrillation ([place])   . CHF (congestive heart failure) ([place])   . Depression   . Diabetes mellitus without complication ([place])   . Emphysema of lung ([place])   . Heart murmur   . Hyperlipidemia   . Hypertension     Patient Active Problem List   Diagnosis Date Noted  . Chronic constipation 07/19/2019  . Moderate persistent asthma with acute exacerbation 06/17/2019  . Hospital discharge follow-up 06/17/2019  . Shortness of breath 06/12/2019  . Chest tightness 06/12/2019  . Snoring 03/29/2019  . Insomnia due to other mental disorder 11/15/2018  . Anticoagulant long-term use 10/21/2018  . Gastroesophageal reflux disease  without esophagitis 08/10/2018  . Tobacco abuse 08/10/2018  . Chest pain 07/27/2018  . PAF (paroxysmal atrial fibrillation) ([place]) 07/27/2018  . Mild renal insufficiency 07/27/2018  . Chronic combined systolic and diastolic heart failure ([place]) 07/27/2018  . Slow transit constipation 07/20/2018  . Essential hypertension 07/06/2018  . Chronic obstructive pulmonary disease ([place]) 07/06/2018  . Controlled type 2 diabetes mellitus without complication, without long-term current use of insulin ([place]) 07/06/2018  . Depression with anxiety 07/06/2018  . Anemia 07/06/2018  . Healthcare maintenance 07/06/2018  . Alcohol abuse 07/06/2018    History reviewed. No pertinent surgical history.      Home Medications    Prior to Admission medications   Medication Sig Start Date End Date Taking? Authorizing Provider  albuterol (PROVENTIL HFA;VENTOLIN HFA) 108 (90 Base) MCG/ACT inhaler Inhale 1-2 puffs into the lungs every 4 (four) hours as needed for wheezing or shortness of breath.  06/19/16  Yes [provider]  albuterol (PROVENTIL) (2.5 MG/3ML) 0.083% nebulizer solution Take 3 mLs (2.5 mg total) by nebulization 5 (five) times daily as needed for wheezing or shortness of breath. 08/04/18  Yes [name], MD  amLODipine (NORVASC) 10 MG tablet Take 1 tablet (10 mg total) by mouth daily. 07/19/19  Yes Luetta Nutting, DO  Aspirin-Salicylamide-Caffeine (BC HEADACHE POWDER PO) Take 1 packet by mouth every 6 (six) hours as needed (headache, pain).   Yes [provider]  busPIRone (BUSPAR) 15 MG tablet TAKE 1 TABLET BY MOUTH TWO  TIMES DAILY Patient taking differently: Take 15 mg by mouth 2 (two) times daily.  05/26/19  Yes [name], MD  carvedilol (COREG) 6.25 MG tablet Take 2 tablets (12.5 mg total) by mouth 2 (two) times daily. 06/06/19  Yes [name], MD  ELIQUIS 5 MG TABS tablet TAKE 1 TABLET BY MOUTH TWO  TIMES DAILY Patient taking differently: Take 5  mg by mouth 2 (two) times daily.  03/28/19  Yes Buford Dresser, MD  famotidine (PEPCID) 20 MG tablet TAKE 1 TABLET BY MOUTH  DAILY Patient taking differently: Take 20 mg by mouth daily.  05/04/19  Yes [name], MD  FLUoxetine (PROZAC) 20 MG capsule TAKE 3 CAPSULES BY MOUTH  DAILY Patient taking differently: Take 60 mg by mouth daily.  05/26/19  Yes [name], MD  Fluticasone-Salmeterol (ADVAIR DISKUS) 250-50 MCG/DOSE AEPB Inhale 1 puff into the lungs 2 (two) times daily. 07/05/19  Yes [name], MD  furosemide (LASIX) 20 MG tablet TAKE 1 TABLET BY MOUTH  DAILY Patient taking differently: Take 20 mg by mouth daily.  05/26/19  Yes [name], MD  losartan (COZAAR) 25 MG tablet TAKE 1 TABLET BY MOUTH  DAILY Patient taking differently: Take 25 mg by mouth daily.  08/01/19  Yes Buford Dresser, MD  metFORMIN (GLUCOPHAGE) 500 MG tablet TAKE 1 TABLET BY MOUTH TWO  TIMES DAILY WITH MEALS Patient taking differently: Take 500 mg by mouth 2 (two) times daily with a meal.  05/26/19  Yes [name], MD  nitroGLYCERIN (NITROSTAT) 0.4 MG SL tablet DISSOLVE 1 TABLET UNDER THE TONGUE EVERY 5 MINUTES AS  NEEDED FOR CHEST PAIN. MAX  OF 3 TABLETS IN 15 MINUTES. CALL 911 IF PAIN PERSISTS. Patient taking differently: Place 0.4 mg under the tongue every 5 (five) minutes as needed for chest pain.  06/29/19  Yes [name], MD  traZODone (DESYREL) 50 MG tablet TAKE 1/2 TO 1 TABLET BY  MOUTH AT BEDTIME AS NEEDED  FOR SLEEP. Patient taking differently: Take 25-50 mg by mouth at bedtime as needed for sleep.  05/26/19  Yes [name], MD  Blood Glucose Monitoring Suppl (ONE TOUCH ULTRA 2) w/Device KIT Use to test blood sugars 1-2 times daily. 05/20/19   [name], MD  glucose blood [facility] ULTRA) test strip Use to test blood sugars 1-2 times daily. 05/20/19   [name], MD  ipratropium (ATROVENT) 0.02 % nebulizer  solution Take 2.5 mLs (0.5 mg total) by nebulization 4 (four) times daily. 08/05/19   Citlalic Norlander, Bea Graff, PA-C  Lancets (ONETOUCH ULTRASOFT) lancets Use to test blood sugars 1-2 times daily. 05/20/19   [name], MD  nicotine (NICODERM CQ - DOSED IN MG/24 HR) 7 mg/24hr patch Place 1 patch (7 mg total) onto the skin daily. Patient not taking: Reported on 07/19/2019 07/31/18   Kayleen Memos, DO  predniSONE (STERAPRED UNI-PAK 21 TAB) 10 MG (21) TBPK tablet Take 6 tabs by mouth for 2 days, then 5 tabs for 2 days, then 4 tabs for 2 days, then 3 tabs for 2 days, 2 tabs for 2 days, then 1 tabfor 2 days 08/05/19   Frederica Kuster, PA-C    Family History Family History  Problem Relation Age of Onset  . Diabetes Sister   . Coronary artery disease Brother     Social History Social History  Tobacco Use  . Smoking status: Current Some Day Smoker    Packs/day: 0.25    Types: Cigarettes  . Smokeless tobacco: Never Used  Substance Use Topics  . Alcohol use: Yes    Comment: Drinks up to six beers around a game  . Drug use: Yes    Types: Marijuana    Comment: occ marijuana     Allergies   Lisinopril and Tomato   Review of Systems Review of Systems  Constitutional: Positive for chills and diaphoresis. Negative for fever.  HENT: Negative for facial swelling and sore throat.   Respiratory: Positive for cough and shortness of breath.   Cardiovascular: Negative for chest pain.  Gastrointestinal: Negative for abdominal pain, nausea and vomiting.  Genitourinary: Negative for dysuria.  Musculoskeletal: Negative for back pain.  Skin: Negative for rash and wound.  Neurological: Negative for headaches.  Psychiatric/Behavioral: The patient is not nervous/anxious.      Physical Exam Updated Vital Signs BP (!) 142/77   Pulse 81   Temp 97.9 F (36.6 C) (Oral)   Resp 17   SpO2 97%   Physical Exam Vitals signs and nursing note reviewed.  Constitutional:      General: He is not  in acute distress.    Appearance: He is well-developed. He is not diaphoretic.  HENT:     Head: Normocephalic and atraumatic.     Mouth/Throat:     Pharynx: No oropharyngeal exudate.  Eyes:     General: No scleral icterus.       Right eye: No discharge.        Left eye: No discharge.     Conjunctiva/sclera: Conjunctivae normal.     Pupils: Pupils are equal, round, and reactive to light.  Neck:     Musculoskeletal: Normal range of motion and neck supple.     Thyroid: No thyromegaly.  Cardiovascular:     Rate and Rhythm: Normal rate and regular rhythm.     Heart sounds: Normal heart sounds. No murmur. No friction rub. No gallop.   Pulmonary:     Effort: Pulmonary effort is normal. No respiratory distress.     Breath sounds: No stridor. Decreased breath sounds and rhonchi present. No wheezing or rales.     Comments: 6L on NRB with mild tachypnea Abdominal:     General: Bowel sounds are normal. There is no distension.     Palpations: Abdomen is soft.     Tenderness: There is no abdominal tenderness. There is no guarding or rebound.  Musculoskeletal:     Right lower leg: He exhibits no tenderness. No edema.     Left lower leg: He exhibits no tenderness. No edema.  Lymphadenopathy:     Cervical: No cervical adenopathy.  Skin:    General: Skin is warm and dry.     Coloration: Skin is not pale.     Findings: No rash.  Neurological:     Mental Status: He is alert.     Coordination: Coordination normal.      ED Treatments / Results  Labs (all labs ordered are listed, but only abnormal results are displayed) Labs Reviewed  COMPREHENSIVE METABOLIC PANEL - Abnormal; Notable for the following components:      Result Value   Chloride 95 (*)    Glucose, Bld 129 (*)    Calcium 8.8 (*)    Total Protein 6.3 (*)    All other components within normal limits  POCT I-STAT EG7 - Abnormal; Notable for the  following components:   pH, Ven 7.465 (*)    pO2, Ven 117.0 (*)    Bicarbonate  33.6 (*)    TCO2 35 (*)    Acid-Base Excess 8.0 (*)    Calcium, Ion 1.07 (*)    All other components within normal limits  SARS CORONAVIRUS 2 (TAT 6-24 HRS)  CBC WITH DIFFERENTIAL/PLATELET  I-STAT VENOUS BLOOD GAS, ED    EKG EKG Interpretation  Date/Time:  Thursday August 04 2019 22:49:54 EST Ventricular Rate:  77 PR Interval:    QRS Duration: 86 QT Interval:  397 QTC Calculation: 450 R Axis:   79 Text Interpretation: Sinus rhythm Nonspecific repol abnormality, lateral leads When compared with ECG of 06/12/2019, T wave abnormality has improved Confirmed by Delora Fuel ([id number]) on 08/04/2019 11:21:20 PM   Radiology Dg Chest Portable 1 View  Result Date: 08/04/2019 CLINICAL DATA:  Shortness of breath for 2 hours EXAM: PORTABLE CHEST 1 VIEW COMPARISON:  07/19/2019 FINDINGS: Cardiac shadow is stable. Aortic calcifications are again seen. The lungs are hyperinflated consistent with COPD. No focal infiltrate or sizable effusion is seen. No bony abnormality is noted. IMPRESSION: COPD without acute abnormality. Electronically Signed   By: Inez Catalina M.D.   On: 08/04/2019 22:58    Procedures Procedures (including critical care time)  Medications Ordered in ED Medications  methylPREDNISolone sodium succinate (SOLU-MEDROL) 125 mg/2 mL injection 125 mg (125 mg Intravenous Given 08/04/19 2253)  albuterol (VENTOLIN HFA) 108 (90 Base) MCG/ACT inhaler 8 puff (8 puffs Inhalation Given 08/04/19 2253)  ipratropium (ATROVENT HFA) inhaler 2 puff (2 puffs Inhalation Given 08/04/19 2334)  albuterol (VENTOLIN HFA) 108 (90 Base) MCG/ACT inhaler 6 puff (6 puffs Inhalation Given 08/05/19 0100)  ipratropium (ATROVENT HFA) inhaler 2 puff (2 puffs Inhalation Given 08/05/19 0100)     Initial Impression / Assessment and Plan / ED Course  I have reviewed the triage vital signs and the nursing notes.  Pertinent labs & imaging results that were available during my care of the patient were reviewed by  me and considered in my medical decision making (see chart for details).  Clinical Course as of Aug 04 616  Fri Aug 05, 2019  0029 On reassessment, patient much improved after albuterol, atrovent, and solu-medrol. Now with wheezing bilaterally. Will reorder inhalers. Trial de-escalating oxygen.   [AL]    Clinical Course User Index [AL] Frederica Kuster, PA-C       Patient with COPD exacerbation.  Patient arrived on nonrebreather, however after albuterol, Atrovent, Solu-Medrol, he is feeling much better and back to baseline.  He ambulates with oxygen saturations above 96% without dyspnea.  Patient feels like he can go home.  I feel this is reasonable.  Patient has a nebulizer machine at home and will give Atrovent in addition to his albuterol.  We will also discharge home with prednisone taper.  Close follow-up with PCP discussed.  Return precautions discussed.  Patient understands and agrees with plan.  Patient vital stable and discharged in satisfactory condition.  Patient also guided by my attending, Dr. Leonides Schanz, who guided the patient's management and agrees with plan.  Final Clinical Impressions(s) / ED Diagnoses   Final diagnoses:  COPD exacerbation ([place])  Shortness of breath    ED Discharge Orders         Ordered    ipratropium (ATROVENT) 0.02 % nebulizer solution  4 times daily     08/05/19 0220    predniSONE (STERAPRED UNI-PAK 21 TAB) 10 MG (21) TBPK  tablet     08/05/19 0220           Frederica Kuster, PA-C 08/05/19 0618    Ward, Delice Bison, DO 08/05/19 [phone]

## 2019-08-04 NOTE — ED Notes (Signed)
Patient titrated down to 3L supplemental O2 via n.c. from 6L NRB. Sats continue to remain at 100%

## 2019-08-05 ENCOUNTER — Telehealth: Payer: Self-pay | Admitting: Family Medicine

## 2019-08-05 LAB — SARS CORONAVIRUS 2 (TAT 6-24 HRS): SARS Coronavirus 2: NEGATIVE

## 2019-08-05 MED ORDER — ALBUTEROL SULFATE HFA 108 (90 BASE) MCG/ACT IN AERS
6.0000 | INHALATION_SPRAY | Freq: Once | RESPIRATORY_TRACT | Status: AC
  Administered 2019-08-05: 6 via RESPIRATORY_TRACT
  Filled 2019-08-05: qty 6.7

## 2019-08-05 MED ORDER — PREDNISONE 10 MG (21) PO TBPK: ORAL_TABLET | ORAL | 0 refills | Status: DC

## 2019-08-05 MED ORDER — IPRATROPIUM BROMIDE 0.02 % IN SOLN: 0.5000 mg | Freq: Four times a day (QID) | RESPIRATORY_TRACT | 12 refills | Status: DC

## 2019-08-05 MED ORDER — IPRATROPIUM BROMIDE HFA 17 MCG/ACT IN AERS
2.0000 | INHALATION_SPRAY | Freq: Once | RESPIRATORY_TRACT | Status: AC
  Administered 2019-08-05: 2 via RESPIRATORY_TRACT
  Filled 2019-08-05: qty 12.9

## 2019-08-05 NOTE — Telephone Encounter (Signed)
Pt requesting rx for ipratropium (ATROVENT) 0.02 % nebulizer solution to go to [facility]. Pt went to ED and was given rx. Stated he will call back to make HFU appt.   [place], [place] [employer] [phone] (Phone) [phone] (Fax)

## 2019-08-05 NOTE — ED Notes (Signed)
O2 sat stayed at 93% on RA while ambulating independently. No tachypnea observed, no increase in difficulty breathing reported.

## 2019-08-05 NOTE — ED Provider Notes (Signed)
Medical screening examination/treatment/procedure(s) were conducted as a shared visit with non-physician practitioner(s) and myself.  I personally evaluated the patient during the encounter.  EKG Interpretation  Date/Time:  Thursday August 04 2019 22:49:54 EST Ventricular Rate:  77 PR Interval:    QRS Duration: 86 QT Interval:  397 QTC Calculation: 450 R Axis:   79 Text Interpretation: Sinus rhythm Nonspecific repol abnormality, lateral leads When compared with ECG of 06/12/2019, T wave abnormality has improved Confirmed by Delora Fuel ([id number]) on 08/04/2019 11:21:20 PM   Patient is a 58 year old male who presents to the emergency department shortness of breath, hypoxia.  Suspect COPD exacerbation.  No signs of CHF exacerbation on exam or chest x-ray.  Patient given Solu-Medrol, albuterol, Atrovent and now lungs are completely clear and he is satting 96 to 98% on room air with no increased work of breathing.  He feels comfortable with plan for discharge home.  Provided with inhalers and steroids on discharge.  Discussed return precautions.   Ward, Delice Bison, DO 08/05/19 [phone]

## 2019-08-05 NOTE — Discharge Instructions (Addendum)
Take prednisone as prescribed until completed for your COPD exacerbation.  Use albuterol and Atrovent every 6 hours for wheezing or shortness of breath.  Please follow-up with your doctor in the next few days for recheck.  Please return immediately if you develop any worsening symptoms including severe worsening shortness of breath, chest pain, or any other concerning symptom.

## 2019-08-05 NOTE — ED Notes (Signed)
Discharge instructions discussed with pt. Pt verbalized understanding. Pt stable and ambulatory. No signature pad available. 

## 2019-08-08 ENCOUNTER — Institutional Professional Consult (permissible substitution): Payer: Medicare Other | Admitting: Pulmonary Disease

## 2019-08-08 DIAGNOSIS — G4733 Obstructive sleep apnea (adult) (pediatric): Secondary | ICD-10-CM | POA: Diagnosis not present

## 2019-08-09 ENCOUNTER — Other Ambulatory Visit: Payer: Self-pay

## 2019-08-09 MED ORDER — ALBUTEROL SULFATE (2.5 MG/3ML) 0.083% IN NEBU: 2.5000 mg | INHALATION_SOLUTION | Freq: Every day | RESPIRATORY_TRACT | 3 refills | Status: DC | PRN

## 2019-08-12 DIAGNOSIS — L602 Onychogryphosis: Secondary | ICD-10-CM | POA: Diagnosis not present

## 2019-08-12 DIAGNOSIS — M21612 Bunion of left foot: Secondary | ICD-10-CM | POA: Diagnosis not present

## 2019-08-12 DIAGNOSIS — L84 Corns and callosities: Secondary | ICD-10-CM | POA: Diagnosis not present

## 2019-08-12 DIAGNOSIS — E1351 Other specified diabetes mellitus with diabetic peripheral angiopathy without gangrene: Secondary | ICD-10-CM | POA: Diagnosis not present

## 2019-08-12 DIAGNOSIS — M21611 Bunion of right foot: Secondary | ICD-10-CM | POA: Diagnosis not present

## 2019-08-12 DIAGNOSIS — B353 Tinea pedis: Secondary | ICD-10-CM | POA: Diagnosis not present

## 2019-08-24 ENCOUNTER — Other Ambulatory Visit: Payer: Self-pay | Admitting: Family Medicine

## 2019-08-24 DIAGNOSIS — I10 Essential (primary) hypertension: Secondary | ICD-10-CM

## 2019-08-24 DIAGNOSIS — I480 Paroxysmal atrial fibrillation: Secondary | ICD-10-CM

## 2019-08-26 ENCOUNTER — Institutional Professional Consult (permissible substitution): Payer: Medicare Other | Admitting: Pulmonary Disease

## 2019-09-06 ENCOUNTER — Other Ambulatory Visit: Payer: Self-pay | Admitting: Family Medicine

## 2019-09-06 DIAGNOSIS — F418 Other specified anxiety disorders: Secondary | ICD-10-CM

## 2019-09-06 MED ORDER — BUSPIRONE HCL 15 MG PO TABS: 15.0000 mg | ORAL_TABLET | Freq: Two times a day (BID) | ORAL | 1 refills | Status: AC

## 2019-09-07 DIAGNOSIS — G4733 Obstructive sleep apnea (adult) (pediatric): Secondary | ICD-10-CM | POA: Diagnosis not present

## 2019-10-08 DIAGNOSIS — G4733 Obstructive sleep apnea (adult) (pediatric): Secondary | ICD-10-CM | POA: Diagnosis not present

## 2019-10-20 ENCOUNTER — Other Ambulatory Visit: Payer: Self-pay | Admitting: Cardiology

## 2019-11-07 ENCOUNTER — Other Ambulatory Visit (HOSPITAL_COMMUNITY): Payer: Self-pay | Admitting: Cardiology

## 2019-11-08 DIAGNOSIS — G4733 Obstructive sleep apnea (adult) (pediatric): Secondary | ICD-10-CM | POA: Diagnosis not present

## 2019-11-18 DIAGNOSIS — L84 Corns and callosities: Secondary | ICD-10-CM | POA: Diagnosis not present

## 2019-11-18 DIAGNOSIS — E1351 Other specified diabetes mellitus with diabetic peripheral angiopathy without gangrene: Secondary | ICD-10-CM | POA: Diagnosis not present

## 2019-11-18 DIAGNOSIS — L602 Onychogryphosis: Secondary | ICD-10-CM | POA: Diagnosis not present

## 2019-11-18 DIAGNOSIS — R6889 Other general symptoms and signs: Secondary | ICD-10-CM | POA: Diagnosis not present

## 2019-11-29 ENCOUNTER — Telehealth: Payer: Self-pay | Admitting: Family Medicine

## 2019-11-29 NOTE — Telephone Encounter (Signed)
Attempted to schedule AWV-Unable to LVM.  Will try to call at a later time.

## 2019-12-02 ENCOUNTER — Telehealth: Payer: Self-pay | Admitting: Family Medicine

## 2019-12-02 NOTE — Telephone Encounter (Signed)
Left message for patient to schedule Annual Wellness Visit.  Please schedule with Nurse Health Advisor Victoria Britt, RN at [place]  

## 2019-12-06 DIAGNOSIS — G4733 Obstructive sleep apnea (adult) (pediatric): Secondary | ICD-10-CM | POA: Diagnosis not present

## 2019-12-22 ENCOUNTER — Ambulatory Visit: Payer: Medicare Other | Attending: Internal Medicine

## 2019-12-22 DIAGNOSIS — Z23 Encounter for immunization: Secondary | ICD-10-CM

## 2019-12-22 NOTE — Progress Notes (Signed)
   Covid-19 Vaccination Clinic  Name:  Kenneth [name]    MRN: [id number] DOB: 1961-04-23  12/22/2019  [name] was observed post Covid-19 immunization for 30 minutes based on pre-vaccination screening without incident. He was provided with Vaccine Information Sheet and instruction to access the V-Safe system.   [name] was instructed to call 911 with any severe reactions post vaccine: [name] Difficulty breathing  . Swelling of face and throat  . A fast heartbeat  . A bad rash all over body  . Dizziness and weakness   Immunizations Administered    Name Date Dose VIS Date Route   Pfizer COVID-19 Vaccine 12/22/2019  9:52 AM 0.3 mL 09/02/2019 Intramuscular   Manufacturer: [employer], [employer]   Lot: [id number]   NDC: [address]-6

## 2020-01-06 DIAGNOSIS — G4733 Obstructive sleep apnea (adult) (pediatric): Secondary | ICD-10-CM | POA: Diagnosis not present

## 2020-01-09 ENCOUNTER — Other Ambulatory Visit (HOSPITAL_COMMUNITY): Payer: Self-pay | Admitting: Cardiology

## 2020-01-11 ENCOUNTER — Telehealth: Payer: Self-pay | Admitting: Family Medicine

## 2020-01-11 NOTE — Telephone Encounter (Signed)
Patient is calling and wanted to speak to someone regarding medication. CB is [phone].

## 2020-01-11 NOTE — Telephone Encounter (Signed)
Returned patients call, unable to reach patient call not going through. Will call back.

## 2020-01-11 NOTE — Telephone Encounter (Signed)
Patient calling for refill on Atrovent, informed patient that he has refills at his local pharmacy.

## 2020-01-11 NOTE — Telephone Encounter (Signed)
error 

## 2020-01-16 ENCOUNTER — Ambulatory Visit: Payer: Medicare Other | Attending: Internal Medicine

## 2020-01-16 DIAGNOSIS — Z23 Encounter for immunization: Secondary | ICD-10-CM

## 2020-01-16 NOTE — Progress Notes (Signed)
   Covid-19 Vaccination Clinic  Name:  Kenneth [name]    MRN: [id number] DOB: 1961/02/15  01/16/2020  Kenneth [name] was observed post Covid-19 immunization for 15 minutes without incident. He was provided with Vaccine Information Sheet and instruction to access the V-Safe system.   Kenneth [name] was instructed to call 911 with any severe reactions post vaccine: [name] Difficulty breathing  . Swelling of face and throat  . A fast heartbeat  . A bad rash all over body  . Dizziness and weakness   Immunizations Administered    Name Date Dose VIS Date Route   Pfizer COVID-19 Vaccine 01/16/2020  8:25 AM 0.3 mL 11/16/2018 Intramuscular   Manufacturer: [employer], [employer]   Lot: [id number]   NDC: [address]-4

## 2020-01-24 ENCOUNTER — Encounter: Payer: Medicare Other | Admitting: Family Medicine

## 2020-01-26 ENCOUNTER — Other Ambulatory Visit: Payer: Self-pay

## 2020-01-26 MED ORDER — LOSARTAN POTASSIUM 25 MG PO TABS: 25.0000 mg | ORAL_TABLET | Freq: Every day | ORAL | 0 refills | Status: DC

## 2020-01-30 ENCOUNTER — Other Ambulatory Visit: Payer: Self-pay | Admitting: Cardiology

## 2020-01-30 NOTE — Telephone Encounter (Signed)
*  STAT* If patient is at the pharmacy, call can be transferred to refill team.   1. Which medications need to be refilled? (please list name of each medication and dose if known)   losartan (COZAAR) 25 MG tablet    2. Which pharmacy/location (including street and city if local pharmacy) is medication to be sent to? [facility] - [place], [place] - [address]  3. Do they need a 30 day or 90 day supply? 90 day supply

## 2020-01-31 MED ORDER — LOSARTAN POTASSIUM 25 MG PO TABS: 25.0000 mg | ORAL_TABLET | Freq: Every day | ORAL | 0 refills | Status: DC

## 2020-02-01 ENCOUNTER — Telehealth: Payer: Self-pay | Admitting: Family Medicine

## 2020-02-01 NOTE — Telephone Encounter (Signed)
Pt called in saying he had a home visit with [facility] and they said he needs to be put on oxygen and the pt wanted to know what process he needs to go through to get this done, Please Advise

## 2020-02-01 NOTE — Telephone Encounter (Signed)
Needs to see pulmonology 

## 2020-02-02 NOTE — Telephone Encounter (Signed)
Patient called back and stated that he agrees to see a pulmonologist and if a referral be put back in. Pls advise. CB is [phone]

## 2020-02-05 DIAGNOSIS — G4733 Obstructive sleep apnea (adult) (pediatric): Secondary | ICD-10-CM | POA: Diagnosis not present

## 2020-02-06 NOTE — Telephone Encounter (Signed)
Patient is calling back regarding previous message left. Pls advise. CB is [phone]

## 2020-02-08 ENCOUNTER — Emergency Department (HOSPITAL_COMMUNITY): Payer: Medicare Other

## 2020-02-08 ENCOUNTER — Inpatient Hospital Stay (HOSPITAL_COMMUNITY)
Admission: EM | Admit: 2020-02-08 | Discharge: 2020-02-10 | DRG: 190 | Disposition: A | Discharge status: Home / Self Care | Payer: Medicare Other | Attending: Infectious Disease | Admitting: Infectious Disease

## 2020-02-08 ENCOUNTER — Other Ambulatory Visit: Payer: Self-pay

## 2020-02-08 DIAGNOSIS — J441 Chronic obstructive pulmonary disease with (acute) exacerbation: Secondary | ICD-10-CM | POA: Diagnosis not present

## 2020-02-08 DIAGNOSIS — Z79899 Other long term (current) drug therapy: Secondary | ICD-10-CM | POA: Diagnosis not present

## 2020-02-08 DIAGNOSIS — Z743 Need for continuous supervision: Secondary | ICD-10-CM | POA: Diagnosis not present

## 2020-02-08 DIAGNOSIS — R069 Unspecified abnormalities of breathing: Secondary | ICD-10-CM | POA: Diagnosis not present

## 2020-02-08 DIAGNOSIS — I10 Essential (primary) hypertension: Secondary | ICD-10-CM | POA: Diagnosis not present

## 2020-02-08 DIAGNOSIS — R001 Bradycardia, unspecified: Secondary | ICD-10-CM | POA: Diagnosis not present

## 2020-02-08 DIAGNOSIS — I110 Hypertensive heart disease with heart failure: Secondary | ICD-10-CM | POA: Diagnosis not present

## 2020-02-08 DIAGNOSIS — I5023 Acute on chronic systolic (congestive) heart failure: Secondary | ICD-10-CM | POA: Diagnosis present

## 2020-02-08 DIAGNOSIS — F1010 Alcohol abuse, uncomplicated: Secondary | ICD-10-CM | POA: Diagnosis present

## 2020-02-08 DIAGNOSIS — J9621 Acute and chronic respiratory failure with hypoxia: Secondary | ICD-10-CM | POA: Diagnosis not present

## 2020-02-08 DIAGNOSIS — I5042 Chronic combined systolic (congestive) and diastolic (congestive) heart failure: Secondary | ICD-10-CM | POA: Diagnosis present

## 2020-02-08 DIAGNOSIS — E119 Type 2 diabetes mellitus without complications: Secondary | ICD-10-CM

## 2020-02-08 DIAGNOSIS — Z20822 Contact with and (suspected) exposure to covid-19: Secondary | ICD-10-CM | POA: Diagnosis present

## 2020-02-08 DIAGNOSIS — J9600 Acute respiratory failure, unspecified whether with hypoxia or hypercapnia: Secondary | ICD-10-CM

## 2020-02-08 DIAGNOSIS — Z833 Family history of diabetes mellitus: Secondary | ICD-10-CM | POA: Diagnosis not present

## 2020-02-08 DIAGNOSIS — K219 Gastro-esophageal reflux disease without esophagitis: Secondary | ICD-10-CM | POA: Diagnosis not present

## 2020-02-08 DIAGNOSIS — Z7982 Long term (current) use of aspirin: Secondary | ICD-10-CM | POA: Diagnosis not present

## 2020-02-08 DIAGNOSIS — I480 Paroxysmal atrial fibrillation: Secondary | ICD-10-CM | POA: Diagnosis present

## 2020-02-08 DIAGNOSIS — Z7984 Long term (current) use of oral hypoglycemic drugs: Secondary | ICD-10-CM | POA: Diagnosis not present

## 2020-02-08 DIAGNOSIS — F329 Major depressive disorder, single episode, unspecified: Secondary | ICD-10-CM | POA: Diagnosis present

## 2020-02-08 DIAGNOSIS — R0689 Other abnormalities of breathing: Secondary | ICD-10-CM | POA: Diagnosis not present

## 2020-02-08 DIAGNOSIS — F418 Other specified anxiety disorders: Secondary | ICD-10-CM | POA: Diagnosis present

## 2020-02-08 DIAGNOSIS — I959 Hypotension, unspecified: Secondary | ICD-10-CM | POA: Diagnosis not present

## 2020-02-08 DIAGNOSIS — Z87891 Personal history of nicotine dependence: Secondary | ICD-10-CM

## 2020-02-08 DIAGNOSIS — Z7901 Long term (current) use of anticoagulants: Secondary | ICD-10-CM

## 2020-02-08 DIAGNOSIS — Z7951 Long term (current) use of inhaled steroids: Secondary | ICD-10-CM | POA: Diagnosis not present

## 2020-02-08 DIAGNOSIS — Z720 Tobacco use: Secondary | ICD-10-CM | POA: Diagnosis present

## 2020-02-08 DIAGNOSIS — I152 Hypertension secondary to endocrine disorders: Secondary | ICD-10-CM | POA: Diagnosis present

## 2020-02-08 DIAGNOSIS — E785 Hyperlipidemia, unspecified: Secondary | ICD-10-CM | POA: Diagnosis not present

## 2020-02-08 DIAGNOSIS — R0602 Shortness of breath: Secondary | ICD-10-CM | POA: Diagnosis not present

## 2020-02-08 DIAGNOSIS — E1159 Type 2 diabetes mellitus with other circulatory complications: Secondary | ICD-10-CM | POA: Diagnosis present

## 2020-02-08 LAB — BRAIN NATRIURETIC PEPTIDE: B Natriuretic Peptide: 302.3 pg/mL — ABNORMAL HIGH (ref 0.0–100.0)

## 2020-02-08 LAB — COMPREHENSIVE METABOLIC PANEL
ALT: 15 U/L (ref 0–44)
AST: 30 U/L (ref 15–41)
Albumin: 4 g/dL (ref 3.5–5.0)
Alkaline Phosphatase: 64 U/L (ref 38–126)
Anion gap: 19 — ABNORMAL HIGH (ref 5–15)
BUN: 11 mg/dL (ref 6–20)
CO2: 23 mmol/L (ref 22–32)
Calcium: 9.3 mg/dL (ref 8.9–10.3)
Chloride: 96 mmol/L — ABNORMAL LOW (ref 98–111)
Creatinine, Ser: 1.12 mg/dL (ref 0.61–1.24)
GFR calc Af Amer: >60 mL/min (ref >60)
GFR calc non Af Amer: >60 mL/min (ref >60)
Glucose, Bld: 71 mg/dL (ref 70–99)
Potassium: 4.2 mmol/L (ref 3.5–5.1)
Sodium: 138 mmol/L (ref 135–145)
Total Bilirubin: 0.7 mg/dL (ref 0.3–1.2)
Total Protein: 7.2 g/dL (ref 6.5–8.1)

## 2020-02-08 LAB — CBC WITH DIFFERENTIAL/PLATELET
Abs Immature Granulocytes: 0.02 10*3/uL (ref 0.00–0.07)
Basophils Absolute: 0.1 10*3/uL (ref 0.0–0.1)
Basophils Relative: 1 %
Eosinophils Absolute: 0.2 10*3/uL (ref 0.0–0.5)
Eosinophils Relative: 3 %
HCT: 45 % (ref 39.0–52.0)
Hemoglobin: 14.3 g/dL (ref 13.0–17.0)
Immature Granulocytes: 0 %
Lymphocytes Relative: 27 %
Lymphs Abs: 1.7 10*3/uL (ref 0.7–4.0)
MCH: 30.3 pg (ref 26.0–34.0)
MCHC: 31.8 g/dL (ref 30.0–36.0)
MCV: 95.3 fL (ref 80.0–100.0)
Monocytes Absolute: 0.7 10*3/uL (ref 0.1–1.0)
Monocytes Relative: 11 %
Neutro Abs: 3.7 10*3/uL (ref 1.7–7.7)
Neutrophils Relative %: 58 %
Platelets: 276 10*3/uL (ref 150–400)
RBC: 4.72 MIL/uL (ref 4.22–5.81)
RDW: 14.2 % (ref 11.5–15.5)
WBC: 6.4 10*3/uL (ref 4.0–10.5)
nRBC: 0 % (ref 0.0–0.2)

## 2020-02-08 LAB — HEMOGLOBIN A1C
Hgb A1c MFr Bld: 6.3 % — ABNORMAL HIGH (ref 4.8–5.6)
Mean Plasma Glucose: 134.11 mg/dL

## 2020-02-08 LAB — POCT I-STAT 7, (LYTES, BLD GAS, ICA,H+H)
Acid-Base Excess: 1 mmol/L (ref 0.0–2.0)
Bicarbonate: 27.5 mmol/L (ref 20.0–28.0)
Calcium, Ion: 1.16 mmol/L (ref 1.15–1.40)
HCT: 43 % (ref 39.0–52.0)
Hemoglobin: 14.6 g/dL (ref 13.0–17.0)
O2 Saturation: 97 %
Patient temperature: 97.5
Potassium: 3.9 mmol/L (ref 3.5–5.1)
Sodium: 137 mmol/L (ref 135–145)
TCO2: 29 mmol/L (ref 22–32)
pCO2 arterial: 47.4 mmHg (ref 32.0–48.0)
pH, Arterial: 7.369 (ref 7.350–7.450)
pO2, Arterial: 93 mmHg (ref 83.0–108.0)

## 2020-02-08 LAB — CBG MONITORING, ED: Glucose-Capillary: 79 mg/dL (ref 70–99)

## 2020-02-08 LAB — TROPONIN I (HIGH SENSITIVITY)
Troponin I (High Sensitivity): 12 ng/L (ref <18)
Troponin I (High Sensitivity): 11 ng/L (ref <18)

## 2020-02-08 LAB — SARS CORONAVIRUS 2 BY RT PCR (HOSPITAL ORDER, PERFORMED IN ~~LOC~~ HOSPITAL LAB): SARS Coronavirus 2: NEGATIVE

## 2020-02-08 MED ORDER — ASPIRIN EC 81 MG PO TBEC
81.0000 mg | DELAYED_RELEASE_TABLET | Freq: Every day | ORAL | Status: DC
  Administered 2020-02-09 – 2020-02-10 (×2): 81 mg via ORAL
  Filled 2020-02-08 (×2): qty 1

## 2020-02-08 MED ORDER — AMLODIPINE BESYLATE 10 MG PO TABS
10.0000 mg | ORAL_TABLET | Freq: Every day | ORAL | Status: DC
  Administered 2020-02-09 – 2020-02-10 (×2): 10 mg via ORAL
  Filled 2020-02-08 (×2): qty 1

## 2020-02-08 MED ORDER — ALBUTEROL (5 MG/ML) CONTINUOUS INHALATION SOLN
15.0000 mg/h | INHALATION_SOLUTION | Freq: Once | RESPIRATORY_TRACT | Status: AC
  Administered 2020-02-08: 15 mg/h via RESPIRATORY_TRACT

## 2020-02-08 MED ORDER — LOSARTAN POTASSIUM 25 MG PO TABS
25.0000 mg | ORAL_TABLET | Freq: Every day | ORAL | Status: DC
  Administered 2020-02-09 – 2020-02-10 (×2): 25 mg via ORAL
  Filled 2020-02-08 (×2): qty 1

## 2020-02-08 MED ORDER — PREDNISONE 20 MG PO TABS
40.0000 mg | ORAL_TABLET | Freq: Every day | ORAL | Status: DC
  Administered 2020-02-10: 40 mg via ORAL
  Filled 2020-02-08: qty 2

## 2020-02-08 MED ORDER — FAMOTIDINE 20 MG PO TABS
20.0000 mg | ORAL_TABLET | Freq: Every day | ORAL | Status: DC
  Administered 2020-02-09 – 2020-02-10 (×2): 20 mg via ORAL
  Filled 2020-02-08 (×2): qty 1

## 2020-02-08 MED ORDER — TRAZODONE HCL 50 MG PO TABS: 50.0000 mg | ORAL_TABLET | Freq: Every evening | ORAL | Status: DC | PRN

## 2020-02-08 MED ORDER — APIXABAN 5 MG PO TABS
5.0000 mg | ORAL_TABLET | Freq: Two times a day (BID) | ORAL | Status: DC
  Administered 2020-02-09 – 2020-02-10 (×3): 5 mg via ORAL
  Filled 2020-02-08 (×4): qty 1

## 2020-02-08 MED ORDER — SODIUM CHLORIDE 0.9 % IV SOLN
2.0000 g | Freq: Three times a day (TID) | INTRAVENOUS | Status: DC
  Administered 2020-02-08 – 2020-02-09 (×3): 2 g via INTRAVENOUS
  Filled 2020-02-08 (×6): qty 2

## 2020-02-08 MED ORDER — NICOTINE 7 MG/24HR TD PT24
7.0000 mg | MEDICATED_PATCH | Freq: Every day | TRANSDERMAL | Status: DC
  Administered 2020-02-08: 7 mg via TRANSDERMAL
  Filled 2020-02-08 (×4): qty 1

## 2020-02-08 MED ORDER — FUROSEMIDE 20 MG PO TABS
20.0000 mg | ORAL_TABLET | Freq: Every day | ORAL | Status: DC
  Administered 2020-02-09: 20 mg via ORAL
  Filled 2020-02-08: qty 1

## 2020-02-08 MED ORDER — SODIUM CHLORIDE 0.9% FLUSH: 3.0000 mL | INTRAVENOUS | Status: DC | PRN

## 2020-02-08 MED ORDER — SODIUM CHLORIDE 0.9 % IV SOLN
250.0000 mL | INTRAVENOUS | Status: DC | PRN
  Administered 2020-02-09: 250 mL via INTRAVENOUS

## 2020-02-08 MED ORDER — BUSPIRONE HCL 15 MG PO TABS
15.0000 mg | ORAL_TABLET | Freq: Two times a day (BID) | ORAL | Status: DC
  Administered 2020-02-09 – 2020-02-10 (×3): 15 mg via ORAL
  Filled 2020-02-08 (×3): qty 1

## 2020-02-08 MED ORDER — NITROGLYCERIN 0.4 MG SL SUBL: 0.4000 mg | SUBLINGUAL_TABLET | SUBLINGUAL | Status: DC | PRN

## 2020-02-08 MED ORDER — IPRATROPIUM BROMIDE 0.02 % IN SOLN
0.5000 mg | Freq: Four times a day (QID) | RESPIRATORY_TRACT | Status: DC
  Administered 2020-02-08 – 2020-02-10 (×7): 0.5 mg via RESPIRATORY_TRACT
  Filled 2020-02-08 (×7): qty 2.5

## 2020-02-08 MED ORDER — FLUOXETINE HCL 20 MG PO CAPS
60.0000 mg | ORAL_CAPSULE | Freq: Every day | ORAL | Status: DC
  Administered 2020-02-09 – 2020-02-10 (×2): 60 mg via ORAL
  Filled 2020-02-08 (×3): qty 3

## 2020-02-08 MED ORDER — ADULT MULTIVITAMIN W/MINERALS CH
1.0000 | ORAL_TABLET | Freq: Every day | ORAL | Status: DC
  Administered 2020-02-09 – 2020-02-10 (×2): 1 via ORAL
  Filled 2020-02-08 (×2): qty 1

## 2020-02-08 MED ORDER — ALBUTEROL SULFATE (2.5 MG/3ML) 0.083% IN NEBU: 2.5000 mg | INHALATION_SOLUTION | Freq: Every day | RESPIRATORY_TRACT | Status: DC | PRN

## 2020-02-08 MED ORDER — SODIUM CHLORIDE 0.9% FLUSH
3.0000 mL | Freq: Two times a day (BID) | INTRAVENOUS | Status: DC
  Administered 2020-02-09 – 2020-02-10 (×3): 3 mL via INTRAVENOUS

## 2020-02-08 MED ORDER — SODIUM CHLORIDE 0.9 % IV SOLN
500.0000 mg | Freq: Once | INTRAVENOUS | Status: AC
  Administered 2020-02-08: 500 mg via INTRAVENOUS
  Filled 2020-02-08: qty 500

## 2020-02-08 MED ORDER — ALBUTEROL SULFATE HFA 108 (90 BASE) MCG/ACT IN AERS
1.0000 | INHALATION_SPRAY | RESPIRATORY_TRACT | Status: DC | PRN
  Filled 2020-02-08: qty 6.7

## 2020-02-08 MED ORDER — LORAZEPAM 2 MG/ML IJ SOLN
0.5000 mg | Freq: Once | INTRAMUSCULAR | Status: AC
  Administered 2020-02-08: 0.5 mg via INTRAVENOUS
  Filled 2020-02-08: qty 1

## 2020-02-08 MED ORDER — CARVEDILOL 6.25 MG PO TABS
6.2500 mg | ORAL_TABLET | Freq: Two times a day (BID) | ORAL | Status: DC
  Administered 2020-02-09: 6.25 mg via ORAL
  Filled 2020-02-08: qty 1

## 2020-02-08 MED ORDER — INSULIN ASPART 100 UNIT/ML ~~LOC~~ SOLN: 0.0000 [IU] | Freq: Every day | SUBCUTANEOUS | Status: DC

## 2020-02-08 MED ORDER — INSULIN ASPART 100 UNIT/ML ~~LOC~~ SOLN
0.0000 [IU] | Freq: Three times a day (TID) | SUBCUTANEOUS | Status: DC
  Administered 2020-02-09: 3 [IU] via SUBCUTANEOUS
  Administered 2020-02-09: 5 [IU] via SUBCUTANEOUS
  Administered 2020-02-09: 2 [IU] via SUBCUTANEOUS
  Administered 2020-02-10: 3 [IU] via SUBCUTANEOUS
  Administered 2020-02-10: 2 [IU] via SUBCUTANEOUS
  Administered 2020-02-10: 3 [IU] via SUBCUTANEOUS

## 2020-02-08 MED ORDER — METHYLPREDNISOLONE SODIUM SUCC 125 MG IJ SOLR
80.0000 mg | Freq: Four times a day (QID) | INTRAMUSCULAR | Status: AC
  Administered 2020-02-08 – 2020-02-09 (×4): 80 mg via INTRAVENOUS
  Filled 2020-02-08 (×4): qty 2

## 2020-02-08 MED ORDER — ALBUTEROL (5 MG/ML) CONTINUOUS INHALATION SOLN
INHALATION_SOLUTION | RESPIRATORY_TRACT | Status: AC
  Filled 2020-02-08: qty 20

## 2020-02-08 MED ORDER — MOMETASONE FURO-FORMOTEROL FUM 200-5 MCG/ACT IN AERO
2.0000 | INHALATION_SPRAY | Freq: Two times a day (BID) | RESPIRATORY_TRACT | Status: DC
  Administered 2020-02-09 – 2020-02-10 (×3): 2 via RESPIRATORY_TRACT
  Filled 2020-02-08: qty 8.8

## 2020-02-08 NOTE — ED Triage Notes (Signed)
Bib ems with resp distress. Difficulty breathing X2 weeks. Pt been taking his home nebs without relief. Pt antsy with ems. EMS reports no air movement prior to medications. Pt grunting on arrival, accessory muscle use.  Given 125mg  solumedrol, 4grams magnesium and 1 duoneb en route. 149/80 97% 98.62F

## 2020-02-08 NOTE — ED Provider Notes (Signed)
Emergency Department Provider Note   I have reviewed the triage vital signs and the nursing notes.   HISTORY  Chief Complaint Respiratory Distress   HPI Kenneth [name] is a 59 y.o. male with PMH of Emphysema, DM, HLD, HTN, and A-fin on anticoagulation presents emergency department with respiratory distress.  Patient has had shortness of breath symptoms for the past 2 weeks which have gradually worsened until today when he ultimately had to call EMS.  They arrived to find the patient with near silent breath sounds and accessory muscle use.  He was given Solu-Medrol and they attempted to administer a DuoNeb and start him on CPAP but the patient would not tolerate it.  He ultimately improved somewhat after getting magnesium with EMS.  He denies any chest pain.  He has been vaccinated for COVID-19.  He is not experiencing fever.  He has had mild cough consistent with his emphysema.  No abdominal pain or vomiting.   Past Medical History:  Diagnosis Date  . Arthritis   . Asthma   . Atrial fibrillation (HCC)   . CHF (congestive heart failure) (HCC)   . Depression   . Diabetes mellitus without complication (HCC)   . Emphysema of lung (HCC)   . Heart murmur   . Hyperlipidemia   . Hypertension     Patient Active Problem List   Diagnosis Date Noted  . Acute on chronic respiratory failure with hypoxemia (HCC) 02/08/2020  . Chronic constipation 07/19/2019  . Moderate persistent asthma with acute exacerbation 06/17/2019  . Hospital discharge follow-up 06/17/2019  . Shortness of breath 06/12/2019  . Chest tightness 06/12/2019  . Snoring 03/29/2019  . Insomnia due to other mental disorder 11/15/2018  . Anticoagulant long-term use 10/21/2018  . Gastroesophageal reflux disease without esophagitis 08/10/2018  . Tobacco abuse 08/10/2018  . Chest pain 07/27/2018  . PAF (paroxysmal atrial fibrillation) (HCC) 07/27/2018  . Mild renal insufficiency 07/27/2018  . Chronic combined systolic and  diastolic heart failure (HCC) 07/27/2018  . Slow transit constipation 07/20/2018  . Essential hypertension 07/06/2018  . Chronic obstructive pulmonary disease (HCC) 07/06/2018  . Controlled type 2 diabetes mellitus without complication, without long-term current use of insulin (HCC) 07/06/2018  . Depression with anxiety 07/06/2018  . Anemia 07/06/2018  . Healthcare maintenance 07/06/2018  . Alcohol abuse 07/06/2018    History reviewed. No pertinent surgical history.  Allergies Lisinopril, Other, and Tomato  Family History  Problem Relation Age of Onset  . Diabetes Sister   . Coronary artery disease Brother     Social History Social History   Tobacco Use  . Smoking status: Current Some Day Smoker    Packs/day: 0.25    Types: Cigarettes  . Smokeless tobacco: Never Used  Substance Use Topics  . Alcohol use: Yes    Comment: Drinks up to six beers around a game  . Drug use: Yes    Types: Marijuana    Comment: occ marijuana    Review of Systems  Constitutional: No fever/chills Eyes: No visual changes. ENT: No sore throat. Cardiovascular: Denies chest pain. Respiratory: Positive shortness of breath and cough.  Gastrointestinal: No abdominal pain. No nausea, no vomiting.  No diarrhea.  No constipation. Genitourinary: Negative for dysuria. Musculoskeletal: Negative for back pain. Skin: Negative for rash. Neurological: Negative for headaches, focal weakness or numbness.  10-point ROS otherwise negative.  ____________________________________________   PHYSICAL EXAM:  VITAL SIGNS: ED Triage Vitals  Enc Vitals Group     BP --  Pulse Rate 02/08/20 1609 76     Resp 02/08/20 1609 (!) 24     Temp 02/08/20 1609 (!) 97.5 F (36.4 C)     Temp Source 02/08/20 1609 Oral     SpO2 02/08/20 1609 95 %   Constitutional: Alert and oriented. Alert and providing a history but appears in moderate respiratory distress.  Eyes: Conjunctivae are normal.  Head:  Atraumatic. Nose: No congestion/rhinnorhea. Mouth/Throat: Mucous membranes are moist.  Neck: No stridor.  Cardiovascular: Normal rate, regular rhythm. Good peripheral circulation. Grossly normal heart sounds.   Respiratory: Increased respiratory effort.  Positive retractions. Lungs with poor air entry bilaterally.  Gastrointestinal: Soft and nontender. No distention.  Musculoskeletal: No lower extremity tenderness nor edema.  Neurologic:  Normal speech and language.  Skin:  Skin is warm, dry and intact. No rash noted.  ____________________________________________   LABS (all labs ordered are listed, but only abnormal results are displayed)  Labs Reviewed  COMPREHENSIVE METABOLIC PANEL - Abnormal; Notable for the following components:      Result Value   Chloride 96 (*)    Anion gap 19 (*)    All other components within normal limits  BRAIN NATRIURETIC PEPTIDE - Abnormal; Notable for the following components:   B Natriuretic Peptide 302.3 (*)    All other components within normal limits  BASIC METABOLIC PANEL - Abnormal; Notable for the following components:   Chloride 96 (*)    Glucose, Bld 148 (*)    Creatinine, Ser 1.38 (*)    GFR calc non Af Amer 56 (*)    Anion gap 17 (*)    All other components within normal limits  CBC WITH DIFFERENTIAL/PLATELET - Abnormal; Notable for the following components:   RBC 4.20 (*)    Hemoglobin 12.8 (*)    Lymphs Abs 0.3 (*)    Monocytes Absolute 0.0 (*)    All other components within normal limits  HEMOGLOBIN A1C - Abnormal; Notable for the following components:   Hgb A1c MFr Bld 6.3 (*)    All other components within normal limits  GLUCOSE, CAPILLARY - Abnormal; Notable for the following components:   Glucose-Capillary 174 (*)    All other components within normal limits  GLUCOSE, CAPILLARY - Abnormal; Notable for the following components:   Glucose-Capillary 232 (*)    All other components within normal limits  SARS CORONAVIRUS 2 BY  RT PCR (HOSPITAL ORDER, [place] [facility])  MRSA PCR SCREENING  CBC WITH DIFFERENTIAL/PLATELET  HIV ANTIBODY (ROUTINE TESTING W REFLEX)  I-STAT ARTERIAL BLOOD GAS, ED  POCT I-STAT 7, (LYTES, BLD GAS, ICA,H+H)  CBG MONITORING, ED  TROPONIN I (HIGH SENSITIVITY)  TROPONIN I (HIGH SENSITIVITY)   ____________________________________________  EKG   EKG Interpretation  Date/Time:  Wednesday Feb 08 2020 16:16:13 EDT Ventricular Rate:  77 PR Interval:    QRS Duration: 83 QT Interval:  422 QTC Calculation: 478 R Axis:   82 Text Interpretation: Sinus rhythm Borderline repolarization abnormality Borderline prolonged QT interval Similar to prior. No STEMI Confirmed by Nanda Quinton [phone]) on 02/08/2020 4:18:37 PM       ____________________________________________  RADIOLOGY  DG Chest Portable 1 View  Result Date: 02/08/2020 CLINICAL DATA:  Difficulty breathing for 2 weeks, shortness of breath EXAM: PORTABLE CHEST 1 VIEW COMPARISON:  08/04/2019 FINDINGS: 2 frontal views of the chest demonstrate an unremarkable cardiac silhouette. No airspace disease, effusion, or pneumothorax. No acute bony abnormalities. IMPRESSION: 1. No acute intrathoracic process. Electronically Signed  By: Sharlet Salina M.D.   On: 02/08/2020 16:47    ____________________________________________   PROCEDURES  Procedure(s) performed:   Procedures  CRITICAL CARE Performed by: Maia Plan Total critical care time: 75 minutes Critical care time was exclusive of separately billable procedures and treating other patients. Critical care was necessary to treat or prevent imminent or life-threatening deterioration. Critical care was time spent personally by me on the following activities: development of treatment plan with patient and/or surrogate as well as nursing, discussions with consultants, evaluation of patient's response to treatment, examination of patient, obtaining history from  patient or surrogate, ordering and performing treatments and interventions, ordering and review of laboratory studies, ordering and review of radiographic studies, pulse oximetry and re-evaluation of patient's condition.  Alona Bene, MD Emergency Medicine  ____________________________________________   INITIAL IMPRESSION / ASSESSMENT AND PLAN / ED COURSE  Pertinent labs & imaging results that were available during my care of the patient were reviewed by me and considered in my medical decision making (see chart for details).   Patient arrives to the emergency department in respiratory distress with very poor air entry throughout all lung fields.  He is afebrile here and has received his full Covid vaccine series.  Will start continuous albuterol.  EMS is given Solu-Medrol and magnesium. Patient anticoagulated with a-fib making PE less likely. Doubt ACS. Suspect COPD exacerbation.    04:22 PM  On reassessment patient is tolerating the continuous albuterol well.  EKG unchanged from prior with no acute ischemic findings.  Chest x-ray and other lab work is pending.   04:37 PM  Reassessed and patient is experiencing some jittery feelings now after being on neb for approximately 20 minutes.  Have ordered a small dose of Ativan.  Air sounds are now audible in the lower lung fields with some end expiratory wheezing.  Patient continues to have some work of breathing notable on exam but accessory muscle use is decreased.  May trial BiPAP but will give neb additional time.   05:11 PM  Patient reassessed. He reports feeling more relaxed and that his breathing seems a bit better. Does have some continued increased WOB but overall improved. Could require BiPAP ultimately. CAT nearly finished. Will send ABG.   05:35 PM  Patient is laying more comfortably and speaking but does continue to have some work of breathing noted on exam.  He may benefit from brief BiPAP.  He does not appear to be clinically  worsening or be nearing intubation. His girlfriend is at bedside and he is speaking with her in short sentences when I enter the room.   05:45 PM  Patient looking much more comfortable on BiPAP. Tolerating well. WOB improved. Will add azithromycin and admit.   Discussed patient's case with [facility] to request admission. Patient and family (if present) updated with plan. Care transferred to [facility] service.  I reviewed all nursing notes, vitals, pertinent old records, EKGs, labs, imaging (as available).  ____________________________________________  FINAL CLINICAL IMPRESSION(S) / ED DIAGNOSES  Final diagnoses:  Acute respiratory failure, unspecified whether with hypoxia or hypercapnia (HCC)  COPD exacerbation (HCC)     MEDICATIONS GIVEN DURING THIS VISIT:  Medications  nicotine (NICODERM CQ - dosed in mg/24 hr) patch 7 mg (7 mg Transdermal Not Given 02/09/20 1010)  famotidine (PEPCID) tablet 20 mg (20 mg Oral Given 02/09/20 1008)  traZODone (DESYREL) tablet 50 mg (has no administration in time range)  furosemide (LASIX) tablet 20 mg (20 mg Oral Given 02/09/20 1008)  FLUoxetine (PROZAC) capsule 60 mg (60 mg Oral Given 02/09/20 1007)  nitroGLYCERIN (NITROSTAT) SL tablet 0.4 mg (has no administration in time range)  mometasone-formoterol (DULERA) 200-5 MCG/ACT inhaler 2 puff (2 puffs Inhalation Given 02/09/20 0902)  amLODipine (NORVASC) tablet 10 mg (10 mg Oral Given 02/09/20 1008)  ipratropium (ATROVENT) nebulizer solution 0.5 mg (0.5 mg Nebulization Not Given 02/09/20 1100)  albuterol (PROVENTIL) (2.5 MG/3ML) 0.083% nebulizer solution 2.5 mg (has no administration in time range)  carvedilol (COREG) tablet 6.25 mg (6.25 mg Oral Given 02/09/20 0746)  busPIRone (BUSPAR) tablet 15 mg (15 mg Oral Given 02/09/20 1008)  apixaban (ELIQUIS) tablet 5 mg (5 mg Oral Given 02/09/20 1006)  losartan (COZAAR) tablet 25 mg (25 mg Oral Given 02/09/20 1008)  aspirin EC tablet 81 mg (81 mg Oral Given 02/09/20 1007)   multivitamin with minerals tablet 1 tablet (1 tablet Oral Given 02/09/20 1008)  sodium chloride flush (NS) 0.9 % injection 3 mL (3 mLs Intravenous Given 02/09/20 1011)  sodium chloride flush (NS) 0.9 % injection 3 mL (has no administration in time range)  0.9 %  sodium chloride infusion (250 mLs Intravenous New Bag/Given 02/09/20 0527)  ceFEPIme (MAXIPIME) 2 g in sodium chloride 0.9 % 100 mL IVPB (2 g Intravenous New Bag/Given 02/09/20 0642)  methylPREDNISolone sodium succinate (SOLU-MEDROL) 125 mg/2 mL injection 80 mg (80 mg Intravenous Given 02/09/20 1213)    Followed by  predniSONE (DELTASONE) tablet 40 mg (has no administration in time range)  insulin aspart (novoLOG) injection 0-15 Units (5 Units Subcutaneous Given 02/09/20 1212)  insulin aspart (novoLOG) injection 0-5 Units (0 Units Subcutaneous Not Given 02/08/20 2216)  MEDLINE mouth rinse (15 mLs Mouth Rinse Not Given 02/09/20 1009)  feeding supplement (ENSURE ENLIVE) (ENSURE ENLIVE) liquid 237 mL (237 mLs Oral Given 02/09/20 1049)  albuterol (PROVENTIL,VENTOLIN) solution continuous neb (15 mg/hr Nebulization Given 02/08/20 1613)  LORazepam (ATIVAN) injection 0.5 mg (0.5 mg Intravenous Given 02/08/20 1651)  azithromycin (ZITHROMAX) 500 mg in sodium chloride 0.9 % 250 mL IVPB (0 mg Intravenous Stopped 02/08/20 2046)  acetaminophen (TYLENOL) tablet 650 mg (650 mg Oral Given 02/09/20 0915)    Note:  This document was prepared using Dragon voice recognition software and may include unintentional dictation errors.  Alona Bene, MD, [facility] Emergency Medicine    Long, Arlyss Repress, MD 02/09/20 1310

## 2020-02-08 NOTE — H&P (Signed)
History and Physical   Tim Corriher SFS:[id number] DOB: 1961-07-25 DOA: 02/08/2020  Referring MD/NP/PA: Dr. Laverta Baltimore  PCP: [name], MD   Outpatient Specialists: None  Patient coming from: Home  Chief Complaint: Shortness of breath  HPI: Kenneth [name] is a 59 y.o. male with medical history significant of COPD, diabetes, hypertension, distal atrial fibrillation, diastolic dysfunction CHF and previous alcohol abuse, Depression, who presents to the ER with significant shortness of breath cough and wheezing.  Symptoms have been going on for 2 days.  Patient has been taking his medications with no relief.  In the ER he was found to be tachypneic significantly short of breath.  Treatment administered including multiple doses of albuterol and steroids did not relieve symptoms.  Patient currently on BiPAP and not moving much air.  He has had some partial relief but still symptomatic.  Patient being admitted to the hospital therefore with acute respiratory failure secondary to COPD exacerbation.  No evidence of pneumonia on chest x-ray..  ED Course: Temperature 97.5 blood pressure 162/85 pulse 92 respirate 25 oxygen sat 95% on room air.  ABG on the BiPAP showed pH of 7.369 PCO2 47 PO2 93 at the moment.  Initial chemistry and CBC appear to be within normal.  COVID-19 screen is negative.  Chest x-ray showed no acute findings.  EKG showed sinus tachycardia otherwise no significant findings.  Patient therefore being admitted to the hospital for treatment of acute respiratory failure due to COPD exacerbation.  Review of Systems: As per HPI otherwise 10 point review of systems negative.    Past Medical History:  Diagnosis Date  . Arthritis   . Asthma   . Atrial fibrillation ([place])   . CHF (congestive heart failure) ([place])   . Depression   . Diabetes mellitus without complication ([place])   . Emphysema of lung ([place])   . Heart murmur   . Hyperlipidemia   . Hypertension     No past surgical  history on file.   reports that he has been smoking cigarettes. He has been smoking about 0.25 packs per day. He has never used smokeless tobacco. He reports current alcohol use. He reports current drug use. Drug: Marijuana.  Allergies  Allergen Reactions  . Lisinopril Swelling  . Other Other (See Comments)    Lettuce : rash  . Tomato Rash    Family History  Problem Relation Age of Onset  . Diabetes Sister   . Coronary artery disease Brother      Prior to Admission medications   Medication Sig Start Date End Date Taking? Authorizing Provider  albuterol (PROVENTIL HFA;VENTOLIN HFA) 108 (90 Base) MCG/ACT inhaler Inhale 1-2 puffs into the lungs every 4 (four) hours as needed for wheezing or shortness of breath.  06/19/16  Yes [provider]  albuterol (PROVENTIL) (2.5 MG/3ML) 0.083% nebulizer solution Take 3 mLs (2.5 mg total) by nebulization 5 (five) times daily as needed for wheezing or shortness of breath. 08/09/19  Yes [name], MD  amLODipine (NORVASC) 10 MG tablet Take 1 tablet (10 mg total) by mouth daily. 07/19/19  Yes Luetta Nutting, DO  apixaban (ELIQUIS) 5 MG TABS tablet Take 1 tablet (5 mg total) by mouth 2 (two) times daily. 11/08/19  Yes Buford Dresser, MD  aspirin EC 81 MG tablet Take 81 mg by mouth daily.   Yes [provider]  Aspirin-Salicylamide-Caffeine (BC HEADACHE POWDER PO) Take 1 packet by mouth every 6 (six) hours as needed (headache, pain).   Yes  [provider]  busPIRone (BUSPAR) 15 MG tablet Take 1 tablet (15 mg total) by mouth 2 (two) times daily. 09/06/19  Yes [name], MD  carvedilol (COREG) 6.25 MG tablet TAKE 2 TABLETS BY MOUTH  TWICE DAILY Patient taking differently: Take 6.25 mg by mouth 2 (two) times daily with a meal.  08/25/19  Yes [name], MD  famotidine (PEPCID) 20 MG tablet TAKE 1 TABLET BY MOUTH  DAILY Patient taking differently: Take 20 mg by mouth daily.  05/04/19  Yes  [name], MD  FLUoxetine (PROZAC) 20 MG capsule TAKE 3 CAPSULES BY MOUTH  DAILY Patient taking differently: Take 60 mg by mouth daily.  05/26/19  Yes [name], MD  Fluticasone-Salmeterol (ADVAIR DISKUS) 250-50 MCG/DOSE AEPB Inhale 1 puff into the lungs 2 (two) times daily. 07/05/19  Yes [name], MD  furosemide (LASIX) 20 MG tablet TAKE 1 TABLET BY MOUTH  DAILY Patient taking differently: Take 20 mg by mouth daily.  05/26/19  Yes [name], MD  ipratropium (ATROVENT) 0.02 % nebulizer solution Take 2.5 mLs (0.5 mg total) by nebulization 4 (four) times daily. 08/05/19  Yes Law, [place], PA-C  losartan (COZAAR) 25 MG tablet Take 1 tablet (25 mg total) by mouth daily. Please schedule annual appt with Dr. Harrell Gave for refills. [phone]. 3rd attempt. Patient taking differently: Take 25 mg by mouth daily.  01/31/20  Yes Buford Dresser, MD  metFORMIN (GLUCOPHAGE) 500 MG tablet TAKE 1 TABLET BY MOUTH TWO  TIMES DAILY WITH MEALS Patient taking differently: Take 500 mg by mouth 2 (two) times daily with a meal.  05/26/19  Yes [name], MD  Multiple Vitamin (MULTIVITAMIN WITH MINERALS) TABS tablet Take 1 tablet by mouth daily.   Yes [provider]  nicotine (NICODERM CQ - DOSED IN MG/24 HR) 7 mg/24hr patch Place 1 patch (7 mg total) onto the skin daily. 07/31/18  Yes Hall, Carole N, DO  nitroGLYCERIN (NITROSTAT) 0.4 MG SL tablet DISSOLVE 1 TABLET UNDER THE TONGUE EVERY 5 MINUTES AS  NEEDED FOR CHEST PAIN. MAX  OF 3 TABLETS IN 15 MINUTES. CALL 911 IF PAIN PERSISTS. Patient taking differently: Place 0.4 mg under the tongue every 5 (five) minutes as needed for chest pain.  06/29/19  Yes [name], MD  traZODone (DESYREL) 50 MG tablet TAKE 1/2 TO 1 TABLET BY  MOUTH AT BEDTIME AS NEEDED  FOR SLEEP. Patient taking differently: Take 50 mg by mouth at bedtime as needed for sleep.  05/26/19  Yes [name], MD   Blood Glucose Monitoring Suppl (ONE TOUCH ULTRA 2) w/Device KIT Use to test blood sugars 1-2 times daily. 05/20/19   [name], MD  glucose blood [facility] ULTRA) test strip Use to test blood sugars 1-2 times daily. 05/20/19   [name], MD  Lancets [facility] ULTRASOFT) lancets Use to test blood sugars 1-2 times daily. 05/20/19   [name], MD  predniSONE (STERAPRED UNI-PAK 21 TAB) 10 MG (21) TBPK tablet Take 6 tabs by mouth for 2 days, then 5 tabs for 2 days, then 4 tabs for 2 days, then 3 tabs for 2 days, 2 tabs for 2 days, then 1 tabfor 2 days Patient not taking: Reported on 02/08/2020 08/05/19   Frederica Kuster, PA-C    Physical Exam: Vitals:   02/08/20 1830 02/08/20 1845 02/08/20 1900 02/08/20 1940  BP:   134/72   Pulse: 85 86 92 90  Resp: (!) 25 16 15 14   Temp:      TempSrc:      SpO2: 100% 100% 99% 100%      Constitutional: Anxious on BiPAP Vitals:   02/08/20 1830 02/08/20 1845 02/08/20 1900 02/08/20 1940  BP:   134/72   Pulse: 85 86 92 90  Resp: (!) 25 16 15 14   Temp:      TempSrc:      SpO2: 100% 100% 99% 100%   Eyes: PERRL, lids and conjunctivae normal ENMT: Mucous membranes are moist. Posterior pharynx clear of any exudate or lesions.Normal dentition.  Neck: normal, supple, no masses, no thyromegaly Respiratory: Decreased air entry with bilateral wheezing no crackles with increased respiratory effort.  No accessory muscle use.  Cardiovascular: Regular rate and rhythm, no murmurs / rubs / gallops. No extremity edema. 2+ pedal pulses. No carotid bruits.  Abdomen: no tenderness, no masses palpated. No hepatosplenomegaly. Bowel sounds positive.  Musculoskeletal: no clubbing / cyanosis. No joint deformity upper and lower extremities. Good ROM, no contractures. Normal muscle tone.  Skin: no rashes, lesions, ulcers. No induration Neurologic: CN 2-12 grossly intact. Sensation intact, DTR normal. Strength 5/5 in all 4.  Psychiatric:  Normal judgment and insight. Alert and oriented x 3. Normal mood.     Labs on Admission: I have personally reviewed following labs and imaging studies  CBC: Recent Labs  Lab 02/08/20 1611 02/08/20 1731  WBC 6.4  --   NEUTROABS 3.7  --   HGB 14.3 14.6  HCT 45.0 43.0  MCV 95.3  --   PLT 276  --    Basic Metabolic Panel: Recent Labs  Lab 02/08/20 1611 02/08/20 1731  NA 138 137  K 4.2 3.9  CL 96*  --   CO2 23  --   GLUCOSE 71  --   BUN 11  --   CREATININE 1.12  --   CALCIUM 9.3  --    GFR: CrCl cannot be calculated (Unknown ideal weight.). Liver Function Tests: Recent Labs  Lab 02/08/20 1611  AST 30  ALT 15  ALKPHOS 64  BILITOT 0.7  PROT 7.2  ALBUMIN 4.0   No results for input(s): LIPASE, AMYLASE in the last 168 hours. No results for input(s): AMMONIA in the last 168 hours. Coagulation Profile: No results for input(s): INR, PROTIME in the last 168 hours. Cardiac Enzymes: No results for input(s): CKTOTAL, CKMB, CKMBINDEX, TROPONINI in the last 168 hours. BNP (last 3 results) No results for input(s): PROBNP in the last 8760 hours. HbA1C: No results for input(s): HGBA1C in the last 72 hours. CBG: No results for input(s): GLUCAP in the last 168 hours. Lipid Profile: No results for input(s): CHOL, HDL, LDLCALC, TRIG, CHOLHDL, LDLDIRECT in the last 72 hours. Thyroid Function Tests: No results for input(s): TSH, T4TOTAL, FREET4, T3FREE, THYROIDAB in the last 72 hours. Anemia Panel: No results for input(s): VITAMINB12, FOLATE, FERRITIN, TIBC, IRON, RETICCTPCT in the last 72 hours. Urine analysis:    Component Value Date/Time   COLORURINE YELLOW 05/20/2019 0825   APPEARANCEUR CLEAR 05/20/2019 0825   LABSPEC 1.020 05/20/2019 0825   PHURINE 6.0 05/20/2019 0825   GLUCOSEU NEGATIVE 05/20/2019 0825   HGBUR NEGATIVE 05/20/2019 0825   BILIRUBINUR SMALL (A) 05/20/2019 0825   KETONESUR TRACE (A) 05/20/2019 0825   UROBILINOGEN 1.0 05/20/2019 0825   NITRITE NEGATIVE  05/20/2019 0825   LEUKOCYTESUR NEGATIVE 05/20/2019 0825   Sepsis Labs: @LABRCNTIP (procalcitonin:4,lacticidven:4) ) Recent Results (from the past 240 hour(s))  SARS  Coronavirus 2 by RT PCR (hospital order, performed in [facility] hospital lab) Nasopharyngeal Nasopharyngeal Swab     Status: None   Collection Time: 02/08/20  4:23 PM   Specimen: Nasopharyngeal Swab  Result Value Ref Range Status   SARS Coronavirus 2 NEGATIVE NEGATIVE Final    Comment: (NOTE) SARS-CoV-2 target nucleic acids are NOT DETECTED. The SARS-CoV-2 RNA is generally detectable in upper and lower respiratory specimens during the acute phase of infection. The lowest concentration of SARS-CoV-2 viral copies this assay can detect is 250 copies / mL. A negative result does not preclude SARS-CoV-2 infection and should not be used as the sole basis for treatment or other patient management decisions.  A negative result may occur with improper specimen collection / handling, submission of specimen other than nasopharyngeal swab, presence of viral mutation(s) within the areas targeted by this assay, and inadequate number of viral copies (<250 copies / mL). A negative result must be combined with clinical observations, patient history, and epidemiological information. Fact Sheet for Patients:   StrictlyIdeas.no Fact Sheet for Healthcare Providers: BankingDealers.co.za This test is not yet approved or cleared  by the [place] FDA and has been authorized for detection and/or diagnosis of SARS-CoV-2 by FDA under an Emergency Use Authorization (EUA).  This EUA will remain in effect (meaning this test can be used) for the duration of the COVID-19 declaration under Section 564(b)(1) of the Act, 21 U.S.C. section 360bbb-3(b)(1), unless the authorization is terminated or revoked sooner. Performed at [facility], [place] [address], [place],  [address]       Radiological Exams on Admission: DG Chest Portable 1 View  Result Date: 02/08/2020 CLINICAL DATA:  Difficulty breathing for 2 weeks, shortness of breath EXAM: PORTABLE CHEST 1 VIEW COMPARISON:  08/04/2019 FINDINGS: 2 frontal views of the chest demonstrate an unremarkable cardiac silhouette. No airspace disease, effusion, or pneumothorax. No acute bony abnormalities. IMPRESSION: 1. No acute intrathoracic process. Electronically Signed   By: Randa Ngo M.D.   On: 02/08/2020 16:47    EKG: Independently reviewed.  Sinus tachycardia, no significant ST changes  Assessment/Plan Principal Problem:   Acute on chronic respiratory failure with hypoxemia (HCC) Active Problems:   Essential hypertension   Controlled type 2 diabetes mellitus without complication, without long-term current use of insulin (HCC)   Depression with anxiety   Alcohol abuse   PAF (paroxysmal atrial fibrillation) (HCC)   Chronic combined systolic and diastolic heart failure (HCC)   Gastroesophageal reflux disease without esophagitis   Tobacco abuse     #1 acute on chronic respiratory failure with hypoxia: Secondary to COPD exacerbation.  Patient currently on BiPAP.  Continue with BiPAP oxygenation as well as steroid nebulizers and antibiotics.  #2 hypertension: Confirm and resume home regimen.  #3 diabetes: Sliding scale insulin.  #4 paroxysmal atrial fibrillation: Patient in sinus rhythm.  Some tachycardia.  #5 history of alcohol abuse: No reported recent alcohol intake.  Continue to monitor possible withdrawals.  #6 depression with anxiety: Resume home regimen.   DVT prophylaxis: Lovenox Code Status: Full code Family Communication: No family at bedside Disposition Plan: Home Consults called: None Admission status: Inpatient  Severity of Illness: The appropriate patient status for this patient is INPATIENT. Inpatient status is judged to be reasonable and necessary in order to provide the required  intensity of service to ensure the patient's safety. The patient's presenting symptoms, physical exam findings, and initial radiographic and laboratory data in the context of their chronic comorbidities  is felt to place them at high risk for further clinical deterioration. Furthermore, it is not anticipated that the patient will be medically stable for discharge from the hospital within 2 midnights of admission. The following factors support the patient status of inpatient.   " The patient's presenting symptoms include shortness of breath. " The worrisome physical exam findings include bilateral wheezing. " The initial radiographic and laboratory data are worrisome because of normal chest x-ray. " The chronic co-morbidities include diabetes and COPD.   * I certify that at the point of admission it is my clinical judgment that the patient will require inpatient hospital care spanning beyond 2 midnights from the point of admission due to high intensity of service, high risk for further deterioration and high frequency of surveillance required.Barbette Merino MD [facility] Pager [phone]  If 7PM-7AM, please contact night-coverage [email or web address] Password [facility]  02/08/2020, 7:44 PM

## 2020-02-09 ENCOUNTER — Encounter (HOSPITAL_COMMUNITY): Payer: Self-pay | Admitting: Internal Medicine

## 2020-02-09 DIAGNOSIS — I5042 Chronic combined systolic (congestive) and diastolic (congestive) heart failure: Secondary | ICD-10-CM

## 2020-02-09 DIAGNOSIS — I480 Paroxysmal atrial fibrillation: Secondary | ICD-10-CM

## 2020-02-09 DIAGNOSIS — J441 Chronic obstructive pulmonary disease with (acute) exacerbation: Principal | ICD-10-CM

## 2020-02-09 LAB — HIV ANTIBODY (ROUTINE TESTING W REFLEX): HIV Screen 4th Generation wRfx: NONREACTIVE

## 2020-02-09 LAB — BASIC METABOLIC PANEL
Anion gap: 17 — ABNORMAL HIGH (ref 5–15)
BUN: 20 mg/dL (ref 6–20)
CO2: 22 mmol/L (ref 22–32)
Calcium: 8.9 mg/dL (ref 8.9–10.3)
Chloride: 96 mmol/L — ABNORMAL LOW (ref 98–111)
Creatinine, Ser: 1.38 mg/dL — ABNORMAL HIGH (ref 0.61–1.24)
GFR calc Af Amer: >60 mL/min (ref >60)
GFR calc non Af Amer: 56 mL/min — ABNORMAL LOW (ref >60)
Glucose, Bld: 148 mg/dL — ABNORMAL HIGH (ref 70–99)
Potassium: 4.3 mmol/L (ref 3.5–5.1)
Sodium: 135 mmol/L (ref 135–145)

## 2020-02-09 LAB — GLUCOSE, CAPILLARY
Glucose-Capillary: 174 mg/dL — ABNORMAL HIGH (ref 70–99)
Glucose-Capillary: 232 mg/dL — ABNORMAL HIGH (ref 70–99)
Glucose-Capillary: 139 mg/dL — ABNORMAL HIGH (ref 70–99)
Glucose-Capillary: 181 mg/dL — ABNORMAL HIGH (ref 70–99)

## 2020-02-09 LAB — CBC WITH DIFFERENTIAL/PLATELET
Abs Immature Granulocytes: 0.01 10*3/uL (ref 0.00–0.07)
Basophils Absolute: 0 10*3/uL (ref 0.0–0.1)
Basophils Relative: 0 %
Eosinophils Absolute: 0 10*3/uL (ref 0.0–0.5)
Eosinophils Relative: 0 %
HCT: 40.2 % (ref 39.0–52.0)
Hemoglobin: 12.8 g/dL — ABNORMAL LOW (ref 13.0–17.0)
Immature Granulocytes: 0 %
Lymphocytes Relative: 7 %
Lymphs Abs: 0.3 10*3/uL — ABNORMAL LOW (ref 0.7–4.0)
MCH: 30.5 pg (ref 26.0–34.0)
MCHC: 31.8 g/dL (ref 30.0–36.0)
MCV: 95.7 fL (ref 80.0–100.0)
Monocytes Absolute: 0 10*3/uL — ABNORMAL LOW (ref 0.1–1.0)
Monocytes Relative: 1 %
Neutro Abs: 3.9 10*3/uL (ref 1.7–7.7)
Neutrophils Relative %: 92 %
Platelets: 232 10*3/uL (ref 150–400)
RBC: 4.2 MIL/uL — ABNORMAL LOW (ref 4.22–5.81)
RDW: 14.4 % (ref 11.5–15.5)
WBC: 4.2 10*3/uL (ref 4.0–10.5)
nRBC: 0 % (ref 0.0–0.2)

## 2020-02-09 LAB — MRSA PCR SCREENING: MRSA by PCR: NEGATIVE

## 2020-02-09 MED ORDER — ALBUTEROL SULFATE HFA 108 (90 BASE) MCG/ACT IN AERS: 1.0000 | INHALATION_SPRAY | RESPIRATORY_TRACT | Status: DC | PRN

## 2020-02-09 MED ORDER — ACETAMINOPHEN 325 MG PO TABS
650.0000 mg | ORAL_TABLET | Freq: Four times a day (QID) | ORAL | Status: DC | PRN
  Administered 2020-02-09 – 2020-02-10 (×2): 650 mg via ORAL
  Filled 2020-02-09 (×2): qty 2

## 2020-02-09 MED ORDER — ORAL CARE MOUTH RINSE: 15.0000 mL | Freq: Two times a day (BID) | OROMUCOSAL | Status: DC

## 2020-02-09 MED ORDER — ENSURE ENLIVE PO LIQD
237.0000 mL | Freq: Two times a day (BID) | ORAL | Status: DC
  Administered 2020-02-09 – 2020-02-10 (×3): 237 mL via ORAL

## 2020-02-09 MED ORDER — ACETAMINOPHEN 325 MG PO TABS
650.0000 mg | ORAL_TABLET | Freq: Once | ORAL | Status: AC
  Administered 2020-02-09: 650 mg via ORAL
  Filled 2020-02-09: qty 2

## 2020-02-09 MED ORDER — CARVEDILOL 12.5 MG PO TABS
12.5000 mg | ORAL_TABLET | Freq: Two times a day (BID) | ORAL | Status: DC
  Administered 2020-02-09 – 2020-02-10 (×3): 12.5 mg via ORAL
  Filled 2020-02-09 (×3): qty 1

## 2020-02-09 NOTE — Evaluation (Signed)
Physical Therapy Evaluation/ discharge Patient Details Name: Kenneth [name] MRN: [id number] DOB: 08-18-1961 Today's Date: 02/09/2020   History of Present Illness  59 yo admitted with COPD exacerbation. PMHx: COPD, DM, HTN, AFib, CHF, ETOh abuse, depression  Clinical Impression  Pt pleasant in room with RN on arrival on 4L O2 at 99% and able to transition to RA for static and dynamic activity with SpO2 >94% throughout. Pt reports he self limits activity at baseline for fear of getting SOB and worn out. Pt educated for benefit of pulse oximeter for home use/assessment and initiation of walking program. Pt educated for energy conservation techniques to maintain function and increase activity tolerance. Pt at baseline functional mobility without further acute therapy needs with pt aware and agreeable. Will sign off.    HR 88 spO2 94-97% on RA BP 165/93   Follow Up Recommendations No PT follow up    Equipment Recommendations  None recommended by PT    Recommendations for Other Services       Precautions / Restrictions Precautions Precautions: None Restrictions Weight Bearing Restrictions: No      Mobility  Bed Mobility Overal bed mobility: Independent                Transfers Overall transfer level: Independent                  Ambulation/Gait Ambulation/Gait assistance: Independent Gait Distance (Feet): 180 Feet Assistive device: None Gait Pattern/deviations: WFL(Within Functional Limits)   Gait velocity interpretation: >4.37 ft/sec, indicative of normal walking speed General Gait Details: pt with good speed, stability and SpO2 maintained >94% on RA throughout  Stairs            Wheelchair Mobility    Modified Rankin (Stroke Patients Only)       Balance Overall balance assessment: No apparent balance deficits (not formally assessed)                                           Pertinent Vitals/Pain Pain Assessment: No/denies  pain    Home Living Family/patient expects to be discharged to:: Private residence Living Arrangements: Spouse/significant other Available Help at Discharge: Family;Available 24 hours/day Type of Home: Apartment Home Access: Level entry     Home Layout: One level Home Equipment: Shower seat      Prior Function Level of Independence: Independent         Comments: pt self reports being a couch potato and not very active outside his apartment     Hand Dominance        Extremity/Trunk Assessment   Upper Extremity Assessment Upper Extremity Assessment: Overall WFL for tasks assessed    Lower Extremity Assessment Lower Extremity Assessment: Overall WFL for tasks assessed    Cervical / Trunk Assessment Cervical / Trunk Assessment: Normal  Communication   Communication: No difficulties  Cognition Arousal/Alertness: Awake/alert Behavior During Therapy: WFL for tasks assessed/performed Overall Cognitive Status: Within Functional Limits for tasks assessed                                        General Comments      Exercises     Assessment/Plan    PT Assessment Patent does not need any further PT services  PT Problem List  PT Treatment Interventions      PT Goals (Current goals can be found in the Care Plan section)  Acute Rehab PT Goals Patient Stated Goal: return home and be able to breathe at night PT Goal Formulation: All assessment and education complete, DC therapy    Frequency     Barriers to discharge        Co-evaluation               AM-PAC PT "6 Clicks" Mobility  Outcome Measure Help needed turning from your back to your side while in a flat bed without using bedrails?: None Help needed moving from lying on your back to sitting on the side of a flat bed without using bedrails?: None Help needed moving to and from a bed to a chair (including a wheelchair)?: None Help needed standing up from a chair using your  arms (e.g., wheelchair or bedside chair)?: None Help needed to walk in hospital room?: None Help needed climbing 3-5 steps with a railing? : None 6 Click Score: 24    End of Session Equipment Utilized During Treatment: Gait belt Activity Tolerance: Patient tolerated treatment well Patient left: in chair;with call bell/phone within reach Nurse Communication: Mobility status PT Visit Diagnosis: Other abnormalities of gait and mobility (R26.89)    Time: 1308-6578 PT Time Calculation (min) (ACUTE ONLY): 23 min   Charges:   PT Evaluation $PT Eval Moderate Complexity: 1 Mod          Maija P, PT Acute Rehabilitation Services Pager: [phone] Office: [phone]   Enedina Finner Prater 02/09/2020, 8:38 AM

## 2020-02-09 NOTE — Progress Notes (Signed)
PROGRESS NOTE    Kenneth [name]  OIZ:[id number] DOB: 06/12/1961 DOA: 02/08/2020 PCP: Mliss Sax, MD   Chief Complaint  Patient presents with  . Respiratory Distress    Brief Narrative:  Kenneth [name] is a 59 y.o. male with medical history significant of COPD, diabetes, hypertension, paroxysmal atrial fibrillation, diastolic dysfunction CHF and previous alcohol abuse, Depression, who presents to the ER with significant shortness of breath cough and wheezing.  Symptoms have been going on for 2 days.  Patient has been taking his medications with no relief.  In the ER he was found to be tachypneic significantly short of breath.  Treatment administered including multiple doses of albuterol and steroids did not relieve symptoms.  Patient was placed on BiPAP.  He has had some partial relief but still symptomatic.  Therefore admitted with acute respiratory failure secondary to COPD exacerbation.  No evidence of pneumonia on chest x-ray..  Labs were unremarkable. COVID-19 screen is negative.  Chest x-ray showed no acute findings.  EKG showed sinus tachycardia otherwise no significant findings.     Assessment & Plan:   Principal Problem:   Acute on chronic respiratory failure with hypoxemia (HCC) Active Problems:   Essential hypertension   Controlled type 2 diabetes mellitus without complication, without long-term current use of insulin (HCC)   Depression with anxiety   Alcohol abuse   PAF (paroxysmal atrial fibrillation) (HCC)   Chronic combined systolic and diastolic heart failure (HCC)   Gastroesophageal reflux disease without esophagitis   Tobacco abuse   #1 acute on chronic respiratory failure with hypoxia: Secondary to COPD exacerbation.  Patient was previously on BiPAP.  Currently on oxygen by nasal cannula. Continue with steroids, nebulizers.  He was given cefepime, azithromycin.  At this time afebrile.  Mild if any sputum production per patient.  Therefore do not think he  needs antibiotics at this time.    #2 hypertension:  He is on Norvasc, Coreg, Cozaar, Lasix.  He still tachycardic.  Will increase the dosage of Coreg.  He also has elevated creatinine.  Therefore will consider discontinuing the Lasix.  Continue to monitor blood pressure closely and adjust medications as needed.  #3 diabetes:  On Metformin.  Expect the blood glucose to be elevated while on Solu-Medrol.  Continue monitoring Accu-Cheks, sliding scale insulin.  Adjust medication doses according to the blood glucose.  #4 paroxysmal atrial fibrillation:  Has some tachycardia.  Will increase the dose of Coreg.  Continue to monitor.  #5 history of alcohol abuse: No reported recent alcohol intake.  Continue to monitor possible withdrawals.  #6 depression with anxiety:  Continue home regimen.   DVT prophylaxis: Eliquis Code Status: Full code Family Communication: Discussed with significant other at the bedside Disposition: Home once stable  Status is: Inpatient             Anticipated d/c date is: 2 days              Patient currently is not medically stable to d/c.    Consultants:   None   Procedures:   None   Antimicrobials:  Anti-infectives (From admission, onward)   Start     Dose/Rate Route Frequency Ordered Stop   02/08/20 2200  ceFEPIme (MAXIPIME) 2 g in sodium chloride 0.9 % 100 mL IVPB  Status:  Discontinued     2 g 200 mL/hr over 30 Minutes Intravenous Every 8 hours 02/08/20 2047 02/09/20 1517   02/08/20 1800  azithromycin (ZITHROMAX) 500 mg in sodium  chloride 0.9 % 250 mL IVPB     500 mg 250 mL/hr over 60 Minutes Intravenous  Once 02/08/20 1745 02/08/20 2046        Subjective: He is complaining of mildly productive cough, wheezing and shortness of breath.  Currently on oxygen by nasal cannula.  Tachycardic.  Objective: Vitals:   02/09/20 0902 02/09/20 0908 02/09/20 1000 02/09/20 1200  BP:   (!) 162/76   Pulse:   77   Resp:   18 16  Temp:    (!) 97.5 F  (36.4 C)  TempSrc:    Oral  SpO2: 100% 99% 97% 97%  Weight:      Height:        Intake/Output Summary (Last 24 hours) at 02/09/2020 1439 Last data filed at 02/09/2020 0749 Gross per 24 hour  Intake 957.28 ml  Output --  Net 957.28 ml   Filed Weights   02/09/20 0529  Weight: 75.5 kg    Examination:  General exam: Having shortness of breath but not in any acute distress at this time Respiratory system: Decreased breath sounds lower lobes, wheezing, no rhonchi Cardiovascular system: S1 & S2 heard, tachycardic, no murmur. No pedal edema. Gastrointestinal system: Abdomen is nondistended, soft and nontender. No organomegaly or masses felt. Normal bowel sounds heard. Central nervous system: Alert and oriented. No focal neurological deficits. Extremities: Symmetric 5 x 5 power. Skin: No rashes, lesions or ulcers Psychiatry: Judgement and insight appear normal. Mood & affect appropriate.     Data Reviewed: I have personally reviewed following labs and imaging studies  CBC: Recent Labs  Lab 02/08/20 1611 02/08/20 1731 02/09/20 0358  WBC 6.4  --  4.2  NEUTROABS 3.7  --  3.9  HGB 14.3 14.6 12.8*  HCT 45.0 43.0 40.2  MCV 95.3  --  95.7  PLT 276  --  232    Basic Metabolic Panel: Recent Labs  Lab 02/08/20 1611 02/08/20 1731 02/09/20 0358  NA 138 137 135  K 4.2 3.9 4.3  CL 96*  --  96*  CO2 23  --  22  GLUCOSE 71  --  148*  BUN 11  --  20  CREATININE 1.12  --  1.38*  CALCIUM 9.3  --  8.9    GFR: Estimated Creatinine Clearance: 62.3 mL/min (A) (by C-G formula based on SCr of 1.38 mg/dL (H)).  Liver Function Tests: Recent Labs  Lab 02/08/20 1611  AST 30  ALT 15  ALKPHOS 64  BILITOT 0.7  PROT 7.2  ALBUMIN 4.0    CBG: Recent Labs  Lab 02/08/20 2214 02/09/20 0631 02/09/20 1127  GLUCAP 79 174* 232*     Recent Results (from the past 240 hour(s))  SARS Coronavirus 2 by RT PCR (hospital order, performed in [facility] hospital lab) Nasopharyngeal  Nasopharyngeal Swab     Status: None   Collection Time: 02/08/20  4:23 PM   Specimen: Nasopharyngeal Swab  Result Value Ref Range Status   SARS Coronavirus 2 NEGATIVE NEGATIVE Final    Comment: (NOTE) SARS-CoV-2 target nucleic acids are NOT DETECTED. The SARS-CoV-2 RNA is generally detectable in upper and lower respiratory specimens during the acute phase of infection. The lowest concentration of SARS-CoV-2 viral copies this assay can detect is 250 copies / mL. A negative result does not preclude SARS-CoV-2 infection and should not be used as the sole basis for treatment or other patient management decisions.  A negative result may occur with improper specimen collection / handling,  submission of specimen other than nasopharyngeal swab, presence of viral mutation(s) within the areas targeted by this assay, and inadequate number of viral copies (<250 copies / mL). A negative result must be combined with clinical observations, patient history, and epidemiological information. Fact Sheet for Patients:   StrictlyIdeas.no Fact Sheet for Healthcare Providers: BankingDealers.co.za This test is not yet approved or cleared  by the [place] FDA and has been authorized for detection and/or diagnosis of SARS-CoV-2 by FDA under an Emergency Use Authorization (EUA).  This EUA will remain in effect (meaning this test can be used) for the duration of the COVID-19 declaration under Section 564(b)(1) of the Act, 21 U.S.C. section 360bbb-3(b)(1), unless the authorization is terminated or revoked sooner. Performed at [facility], [place] [address], [place], [place] [address]   MRSA PCR Screening     Status: None   Collection Time: 02/09/20  5:30 AM   Specimen: Nasal Mucosa; Nasopharyngeal  Result Value Ref Range Status   MRSA by PCR NEGATIVE NEGATIVE Final    Comment:        The GeneXpert MRSA Assay (FDA approved for NASAL specimens only), is  one component of a comprehensive MRSA colonization surveillance program. It is not intended to diagnose MRSA infection nor to guide or monitor treatment for MRSA infections. Performed at [facility], [place] [address], [place], [place] [address]          Radiology Studies: DG Chest Portable 1 View  Result Date: 02/08/2020 CLINICAL DATA:  Difficulty breathing for 2 weeks, shortness of breath EXAM: PORTABLE CHEST 1 VIEW COMPARISON:  08/04/2019 FINDINGS: 2 frontal views of the chest demonstrate an unremarkable cardiac silhouette. No airspace disease, effusion, or pneumothorax. No acute bony abnormalities. IMPRESSION: 1. No acute intrathoracic process. Electronically Signed   By: Randa Ngo M.D.   On: 02/08/2020 16:47        Scheduled Meds: . amLODipine  10 mg Oral Daily  . apixaban  5 mg Oral BID  . aspirin EC  81 mg Oral Daily  . busPIRone  15 mg Oral BID  . carvedilol  6.25 mg Oral BID WC  . famotidine  20 mg Oral Daily  . feeding supplement (ENSURE ENLIVE)  237 mL Oral BID BM  . FLUoxetine  60 mg Oral Daily  . furosemide  20 mg Oral Daily  . insulin aspart  0-15 Units Subcutaneous TID WC  . insulin aspart  0-5 Units Subcutaneous QHS  . ipratropium  0.5 mg Nebulization QID  . losartan  25 mg Oral Daily  . mouth rinse  15 mL Mouth Rinse BID  . methylPREDNISolone (SOLU-MEDROL) injection  80 mg Intravenous Q6H   Followed by  . [START ON 02/10/2020] predniSONE  40 mg Oral Q breakfast  . mometasone-formoterol  2 puff Inhalation BID  . multivitamin with minerals  1 tablet Oral Daily  . nicotine  7 mg Transdermal Daily  . sodium chloride flush  3 mL Intravenous Q12H   Continuous Infusions: . sodium chloride 250 mL (02/09/20 0527)  . ceFEPime (MAXIPIME) IV 2 g (02/09/20 1424)     LOS: 1 day    Yaakov Guthrie, MD [facility]   To contact the attending provider between 7A-7P or the covering provider during after hours 7P-7A, please log into the web site  [email or web address] and access using universal [place] password for that web site. If you do not have the password, please call the hospital operator.  02/09/2020, 2:39 PM

## 2020-02-09 NOTE — Progress Notes (Signed)
RT note-Patient is currently off Bipap and on room air, sp02 97%. Patient concerned with shortness of breath and needed oxygen at home, requesting a social worker on come speak with him. BBS are diminished with expiratory wheeze, nebulizer given and Dulera MDI as well.

## 2020-02-09 NOTE — Progress Notes (Signed)
Initial Nutrition Assessment  DOCUMENTATION CODES:   Not applicable  INTERVENTION:   - MVI with minerals daily  - Ensure Enlive po BID, each supplement provides 350 kcal and 20 grams of protein  NUTRITION DIAGNOSIS:   Increased nutrient needs related to chronic illness as evidenced by estimated needs.  GOAL:   Patient will meet greater than or equal to 90% of their needs  MONITOR:   PO intake, Supplement acceptance, Labs, Weight trends  REASON FOR ASSESSMENT:   Consult COPD Protocol  ASSESSMENT:   59 year old male who presented on 5/19 with SOB. PMH of COPD, DM, HTN, atrial fibrillation, CHF, previous EtOH abuse, depression. Pt admitted with acute respiratory failure secondary to COPD exacerbation.   Spoke with pt and significant other at bedside. Pt reports that he did not have anything to eat yesterday and that when he tried to eat breakfast this morning, his stomach felt bubbly and he could not eat meal. Meal completion charted as 25%.  Pt reports that he typically has a good appetite and eats 2-3 meals daily. Pt reports that he usually eats 2 full meals and then snacks for the other mealtime. A meal may include 1-2 [place] and cheese sandwiches. Pt reports that he drinks apple juice and orange juice.  Pt shares that he manages his blood sugar at home well and that he doesn't use insulin. Pt states that he takes Metformin.  Pt reports that his UBW is a range between 168-174 lbs. Pt reports that his weight fluctuates within this range based on whether or not he is feeling sick. Pt denies any progressive weight loss.  Pt reports that he usually does not experience nausea but did have some this morning after breakfast. Pt denies any issues chewing or swallowing.  Pt reports that he wants to purchase Ensure shakes to drink at home but keeps forgetting. Pt amenable to RD ordering supplements during admission.  Reviewed weight readings in chart. Pt with a 1.9 kg weight loss  since 07/19/19. This is a 2.5% weight loss which is not significant for timeframe. Current weight of 166 lbs is close to pt's UBW range.  Meal Completion: 25% x 1 meal  Medications reviewed and include: pepcid, Lasix, SSI, solu-medrol, MVI with minerals, IV abx  Labs reviewed. CBG's: 79-174 x 24 hours  NUTRITION - FOCUSED PHYSICAL EXAM:    Most Recent Value  Orbital Region  Mild depletion  Upper Arm Region  Mild depletion  Thoracic and Lumbar Region  No depletion  Buccal Region  No depletion  Temple Region  No depletion  Clavicle Bone Region  No depletion  Clavicle and Acromion Bone Region  No depletion  Scapular Bone Region  No depletion  Dorsal Hand  No depletion  Patellar Region  Mild depletion  Anterior Thigh Region  Moderate depletion  Posterior Calf Region  No depletion  Edema (RD Assessment)  None  Hair  Reviewed  Eyes  Reviewed  Mouth  Reviewed  Skin  Reviewed  Nails  Reviewed       Diet Order:   Diet Order            Diet heart healthy/carb modified Room service appropriate? Yes; Fluid consistency: Thin  Diet effective now              EDUCATION NEEDS:   Education needs have been addressed  Skin:  Skin Assessment: Reviewed RN Assessment  Last BM:  no documented BM  Height:   Ht Readings from Last 1  Encounters:  02/09/20 6' (1.829 m)    Weight:   Wt Readings from Last 1 Encounters:  02/09/20 75.5 kg    Ideal Body Weight:  80.9 kg  BMI:  Body mass index is 22.57 kg/m.  Estimated Nutritional Needs:   Kcal:  2100-2300  Protein:  100-115 grams  Fluid:  >/= 2.0 L    Earma Reading, MS, RD, LDN Inpatient Clinical Dietitian Pager: [phone] Weekend/After Hours: [phone]

## 2020-02-09 NOTE — ED Notes (Signed)
Disconntinued BiPap. Pt transitioned to 4L Nasal Cannula, SpO2 98%, RR 16. RT at bedside. RN paged admitting Dr. Mikeal Hawthorne.

## 2020-02-09 NOTE — Progress Notes (Signed)
Chaplain engaged in initial visit with Kenneth [name] and his fiance.  Chaplain provided education around [employer] POA.  Kenneth [name] and fiance will let chaplain know when they want to complete paperwork or if they have any questions.  Kenneth [name] shared that he is feeling much better but has been fighting the battle of being given oxygen.  He notes that every time he has come to the hospital it has been in reference to needing oxygen.  Chaplain listened and offered hope towards some resolve.    Chaplain will follow-up as needed.

## 2020-02-09 NOTE — Progress Notes (Signed)
Transported patient from ED08 to 2C10 without event.

## 2020-02-09 NOTE — Evaluation (Signed)
Occupational Therapy Evaluation Patient Details Name: Kenneth [name] MRN: [id number] DOB: [birth year] Today's Date: 02/09/2020    History of Present Illness 59 yo admitted with COPD exacerbation. PMHx: COPD, DM, HTN, AFib, CHF, ETOh abuse, depression   Clinical Impression   PTA pt living with spouse, independent for BADL/IADL but states not very active outside of home. At time of eval, pt demonstrates ability to complete mobility and BADL at mod I level. Pt has deficits with cardiopulmonary status impacting BADL endurance, as well as anxious tendencies when becoming SOB. Initiated education of ECS strategies, specifically with pacing, prioritizing and pursed lip breathing. Pt was able to recall using his shower seat for ECS. Also educated pt on coping strategies to help reduce anxiety to also assist with the SOB. Will continue to follow while acute to increase understanding and independent implementation of ECS strategies. No post acute OT follow up needed.    Follow Up Recommendations  No OT follow up    Equipment Recommendations  None recommended by OT    Recommendations for Other Services       Precautions / Restrictions Precautions Precautions: None Restrictions Weight Bearing Restrictions: No      Mobility Bed Mobility Overal bed mobility: Independent                Transfers Overall transfer level: Independent                    Balance Overall balance assessment: No apparent balance deficits (not formally assessed)                                         ADL either performed or assessed with clinical judgement   ADL                                         General ADL Comments: Pt demonstrates ability to complete BADLat mod I level, but continues to need support with implementing ECS skills and anxiety coping mechanisms. Pt as able to complete all transfers and standing tasks without LOB or external assist.      Vision Patient Visual Report: No change from baseline       Perception     Praxis      Pertinent Vitals/Pain Pain Assessment: No/denies pain     Hand Dominance     Extremity/Trunk Assessment Upper Extremity Assessment Upper Extremity Assessment: Overall WFL for tasks assessed   Lower Extremity Assessment Lower Extremity Assessment: Overall WFL for tasks assessed       Communication Communication Communication: No difficulties   Cognition Arousal/Alertness: Awake/alert Behavior During Therapy: Anxious Overall Cognitive Status: Within Functional Limits for tasks assessed                                 General Comments: easily anxious with SOB- discussed coping strategies for anxiety and breathing techniques   General Comments  VSS on RA    Exercises     Shoulder Instructions      Home Living Family/patient expects to be discharged to:: Private residence Living Arrangements: Spouse/significant other Available Help at Discharge: Family;Available 24 hours/day Type of Home: Apartment Home Access: Level entry     Home Layout: One level  Bathroom Shower/Tub: Teacher, early years/pre: Standard     Home Equipment: Shower seat          Prior Functioning/Environment Level of Independence: Independent        Comments: pt self reports being a couch potato and not very active outside his apartment        OT Problem List: Decreased knowledge of use of DME or AE;Cardiopulmonary status limiting activity;Decreased activity tolerance      OT Treatment/Interventions: Self-care/ADL training;Therapeutic exercise;Patient/family education;Balance training;Energy conservation;Therapeutic activities;DME and/or AE instruction    OT Goals(Current goals can be found in the care plan section) Acute Rehab OT Goals Patient Stated Goal: reduce admissions OT Goal Formulation: With patient/family Time For Goal Achievement:  02/23/20 Potential to Achieve Goals: Good  OT Frequency: Min 2X/week   Barriers to D/C:            Co-evaluation              AM-PAC OT "6 Clicks" Daily Activity     Outcome Measure Help from another person eating meals?: None Help from another person taking care of personal grooming?: None Help from another person toileting, which includes using toliet, bedpan, or urinal?: None Help from another person bathing (including washing, rinsing, drying)?: None Help from another person to put on and taking off regular upper body clothing?: None Help from another person to put on and taking off regular lower body clothing?: None 6 Click Score: 24   End of Session Equipment Utilized During Treatment: Gait belt Nurse Communication: Mobility status  Activity Tolerance: Patient tolerated treatment well Patient left: in bed;with call bell/phone within reach;with family/visitor present  OT Visit Diagnosis: Other abnormalities of gait and mobility (R26.89);Unsteadiness on feet (R26.81)                Time: 2426-8341 OT Time Calculation (min): 24 min Charges:  OT General Charges $OT Visit: 1 Visit OT Evaluation $OT Eval Moderate Complexity: 1 Mod OT Treatments $Self Care/Home Management : 8-22 mins  Zenovia Jarred, MSOT, OTR/L Acute Rehabilitation Services [facility] Central Office Number: [phone] Pager: [phone]  Zenovia Jarred 02/09/2020, 1:19 PM

## 2020-02-09 NOTE — Discharge Instructions (Addendum)
Information on my medicine - ELIQUIS (apixaban)  Why was Eliquis prescribed for you? Eliquis was prescribed for you to reduce the risk of a blood clot forming that can cause a stroke if you have a medical condition called atrial fibrillation (a type of irregular heartbeat).  What do You need to know about Eliquis ? Take your Eliquis TWICE DAILY - one tablet in the morning and one tablet in the evening with or without food. If you have difficulty swallowing the tablet whole please discuss with your pharmacist how to take the medication safely.  Take Eliquis exactly as prescribed by your doctor and DO NOT stop taking Eliquis without talking to the doctor who prescribed the medication.  Stopping may increase your risk of developing a stroke.  Refill your prescription before you run out.  After discharge, you should have regular check-up appointments with your healthcare provider that is prescribing your Eliquis.  In the future your dose may need to be changed if your kidney function or weight changes by a significant amount or as you get older.  What do you do if you miss a dose? If you miss a dose, take it as soon as you remember on the same day and resume taking twice daily.  Do not take more than one dose of ELIQUIS at the same time to make up a missed dose.  Important Safety Information A possible side effect of Eliquis is bleeding. You should call your healthcare provider right away if you experience any of the following: ? Bleeding from an injury or your nose that does not stop. ? Unusual colored urine (red or dark brown) or unusual colored stools (red or black). ? Unusual bruising for unknown reasons. ? A serious fall or if you hit your head (even if there is no bleeding).  Some medicines may interact with Eliquis and might increase your risk of bleeding or clotting while on Eliquis. To help avoid this, consult your healthcare provider or pharmacist prior to using any new  prescription or non-prescription medications, including herbals, vitamins, non-steroidal anti-inflammatory drugs (NSAIDs) and supplements.  This website has more information on Eliquis (apixaban): http://www.eliquis.com/eliquis/home    Chronic Obstructive Pulmonary Disease Chronic obstructive pulmonary disease (COPD) is a long-term (chronic) lung problem. When you have COPD, it is hard for air to get in and out of your lungs. Usually the condition gets worse over time, and your lungs will never return to normal. There are things you can do to keep yourself as healthy as possible.  Your doctor may treat your condition with: ? Medicines. ? Oxygen. ? Lung surgery.  Your doctor may also recommend: ? Rehabilitation. This includes steps to make your body work better. It may involve a team of specialists. ? Quitting smoking, if you smoke. ? Exercise and changes to your diet. ? Comfort measures (palliative care). Follow these instructions at home: Medicines  Take over-the-counter and prescription medicines only as told by your doctor.  Talk to your doctor before taking any cough or allergy medicines. You may need to avoid medicines that cause your lungs to be dry. Lifestyle  If you smoke, stop. Smoking makes the problem worse. If you need help quitting, ask your doctor.  Avoid being around things that make your breathing worse. This may include smoke, chemicals, and fumes.  Stay active, but remember to rest as well.  Learn and use tips on how to relax.  Make sure you get enough sleep. Most adults need at least 7 hours  of sleep every night.  Eat healthy foods. Eat smaller meals more often. Rest before meals. Controlled breathing Learn and use tips on how to control your breathing as told by your doctor. Try:  Breathing in (inhaling) through your nose for 1 second. Then, pucker your lips and breath out (exhale) through your lips for 2 seconds.  Putting one hand on your belly  (abdomen). Breathe in slowly through your nose for 1 second. Your hand on your belly should move out. Pucker your lips and breathe out slowly through your lips. Your hand on your belly should move in as you breathe out.  Controlled coughing Learn and use controlled coughing to clear mucus from your lungs. Follow these steps: 1. Lean your head a little forward. 2. Breathe in deeply. 3. Try to hold your breath for 3 seconds. 4. Keep your mouth slightly open while coughing 2 times. 5. Spit any mucus out into a tissue. 6. Rest and do the steps again 1 or 2 times as needed. General instructions  Make sure you get all the shots (vaccines) that your doctor recommends. Ask your doctor about a flu shot and a pneumonia shot.  Use oxygen therapy and pulmonary rehabilitation if told by your doctor. If you need home oxygen therapy, ask your doctor if you should buy a tool to measure your oxygen level (oximeter).  Make a COPD action plan with your doctor. This helps you to know what to do if you feel worse than usual.  Manage any other conditions you have as told by your doctor.  Avoid going outside when it is very hot, cold, or humid.  Avoid people who have a sickness you can catch (contagious).  Keep all follow-up visits as told by your doctor. This is important. Contact a doctor if:  You cough up more mucus than usual.  There is a change in the color or thickness of the mucus.  It is harder to breathe than usual.  Your breathing is faster than usual.  You have trouble sleeping.  You need to use your medicines more often than usual.  You have trouble doing your normal activities such as getting dressed or walking around the house. Get help right away if:  You have shortness of breath while resting.  You have shortness of breath that stops you from: ? Being able to talk. ? Doing normal activities.  Your chest hurts for longer than 5 minutes.  Your skin color is more blue than  usual.  Your pulse oximeter shows that you have low oxygen for longer than 5 minutes.  You have a fever.  You feel too tired to breathe normally. Summary  Chronic obstructive pulmonary disease (COPD) is a long-term lung problem.  The way your lungs work will never return to normal. Usually the condition gets worse over time. There are things you can do to keep yourself as healthy as possible.  Take over-the-counter and prescription medicines only as told by your doctor.  If you smoke, stop. Smoking makes the problem worse. This information is not intended to replace advice given to you by your health care provider. Make sure you discuss any questions you have with your health care provider. Document Revised: 08/21/2017 Document Reviewed: 10/13/2016 Elsevier Patient Education  2020 [employer]   Chronic Obstructive Pulmonary Disease Exacerbation Chronic obstructive pulmonary disease (COPD) is a long-term (chronic) lung problem. In COPD, the flow of air from the lungs is limited. COPD exacerbations are times that breathing gets worse  and you need more than your normal treatment. Without treatment, they can be life threatening. If they happen often, your lungs can become more damaged. If your COPD gets worse, your doctor may treat you with:  Medicines.  Oxygen.  Different ways to clear your airway, such as using a mask. Follow these instructions at home: Medicines  Take over-the-counter and prescription medicines only as told by your doctor.  If you take an antibiotic or steroid medicine, do not stop taking the medicine even if you start to feel better.  Keep up with shots (vaccinations) as told by your doctor. Be sure to get a yearly (annual) flu shot. Lifestyle  Do not smoke. If you need help quitting, ask your doctor.  Eat healthy foods.  Exercise regularly.  Get plenty of sleep.  Avoid tobacco smoke and other things that can bother your lungs.  Wash your hands  often with soap and water. This will help keep you from getting an infection. If you cannot use soap and water, use hand sanitizer.  During flu season, avoid areas that are crowded with people. General instructions  Drink enough fluid to keep your pee (urine) clear or pale yellow. Do not do this if your doctor has told you not to.  Use a cool mist machine (vaporizer).  If you use oxygen or a machine that turns medicine into a mist (nebulizer), continue to use it as told.  Follow all instructions for rehabilitation. These are steps you can take to make your body work better.  Keep all follow-up visits as told by your doctor. This is important. Contact a doctor if:  Your COPD symptoms get worse than normal. Get help right away if:  You are short of breath and it gets worse.  You have trouble talking.  You have chest pain.  You cough up blood.  You have a fever.  You keep throwing up (vomiting).  You feel weak or you pass out (faint).  You feel confused.  You are not able to sleep because of your symptoms.  You are not able to do daily activities. Summary  COPD exacerbations are times that breathing gets worse and you need more treatment than normal.  COPD exacerbations can be very serious and may cause your lungs to become more damaged.  Do not smoke. If you need help quitting, ask your doctor.  Stay up-to-date on your shots. Get a flu shot every year. This information is not intended to replace advice given to you by your health care provider. Make sure you discuss any questions you have with your health care provider. Document Revised: 08/21/2017 Document Reviewed: 10/13/2016 Elsevier Patient Education  2020 [employer].

## 2020-02-09 NOTE — Progress Notes (Signed)
Patient weaned off BiPAP to 4L [place].  Patient vitals stable HR 93, Sats 94, RR 24.  RT will continue to monitor.

## 2020-02-09 NOTE — Progress Notes (Signed)
Placed patient back on BiPAP QHS per home regimen.

## 2020-02-10 DIAGNOSIS — F418 Other specified anxiety disorders: Secondary | ICD-10-CM

## 2020-02-10 DIAGNOSIS — I10 Essential (primary) hypertension: Secondary | ICD-10-CM

## 2020-02-10 DIAGNOSIS — E119 Type 2 diabetes mellitus without complications: Secondary | ICD-10-CM

## 2020-02-10 DIAGNOSIS — F1010 Alcohol abuse, uncomplicated: Secondary | ICD-10-CM

## 2020-02-10 LAB — CBC
HCT: 39.5 % (ref 39.0–52.0)
Hemoglobin: 13 g/dL (ref 13.0–17.0)
MCH: 30.9 pg (ref 26.0–34.0)
MCHC: 32.9 g/dL (ref 30.0–36.0)
MCV: 93.8 fL (ref 80.0–100.0)
Platelets: 223 10*3/uL (ref 150–400)
RBC: 4.21 MIL/uL — ABNORMAL LOW (ref 4.22–5.81)
RDW: 14.7 % (ref 11.5–15.5)
WBC: 6.7 10*3/uL (ref 4.0–10.5)
nRBC: 0 % (ref 0.0–0.2)

## 2020-02-10 LAB — GLUCOSE, CAPILLARY
Glucose-Capillary: 189 mg/dL — ABNORMAL HIGH (ref 70–99)
Glucose-Capillary: 133 mg/dL — ABNORMAL HIGH (ref 70–99)
Glucose-Capillary: 152 mg/dL — ABNORMAL HIGH (ref 70–99)

## 2020-02-10 LAB — BASIC METABOLIC PANEL
Anion gap: 9 (ref 5–15)
BUN: 20 mg/dL (ref 6–20)
CO2: 29 mmol/L (ref 22–32)
Calcium: 9 mg/dL (ref 8.9–10.3)
Chloride: 98 mmol/L (ref 98–111)
Creatinine, Ser: 1.23 mg/dL (ref 0.61–1.24)
GFR calc Af Amer: >60 mL/min (ref >60)
GFR calc non Af Amer: >60 mL/min (ref >60)
Glucose, Bld: 159 mg/dL — ABNORMAL HIGH (ref 70–99)
Potassium: 4.3 mmol/L (ref 3.5–5.1)
Sodium: 136 mmol/L (ref 135–145)

## 2020-02-10 MED ORDER — ISOSORBIDE MONONITRATE ER 30 MG PO TB24: 30.0000 mg | ORAL_TABLET | Freq: Every day | ORAL | 0 refills | Status: DC

## 2020-02-10 MED ORDER — CARVEDILOL 12.5 MG PO TABS: 12.5000 mg | ORAL_TABLET | Freq: Two times a day (BID) | ORAL | 0 refills | Status: DC

## 2020-02-10 MED ORDER — ISOSORBIDE MONONITRATE ER 30 MG PO TB24
30.0000 mg | ORAL_TABLET | Freq: Every day | ORAL | Status: DC
  Administered 2020-02-10: 30 mg via ORAL
  Filled 2020-02-10: qty 1

## 2020-02-10 MED ORDER — PREDNISONE 10 MG (21) PO TBPK
ORAL_TABLET | ORAL | 0 refills | Status: DC
Start: 2020-02-10 — End: 2020-02-24

## 2020-02-10 NOTE — TOC Progression Note (Signed)
Transition of Care [facility]) - Progression Note    Patient Details  Name: Kenneth [name] MRN: [id number] Date of Birth: 08/24/1961  [facility] CM/SW Contact  Leone Haven, RN Phone Number: 02/10/2020, 4:51 PM  Clinical Narrative:    NCM made referral to Zach with [employer] for home bipap, Ian Malkin states patient is good to go, they will come out to patient's home and set up the bipap, MD will dc patient today.  NCM informed Insurance claims handler.         Expected Discharge Plan and Services                                                 Social Determinants of Health (SDOH) Interventions    Readmission Risk Interventions No flowsheet data found.

## 2020-02-10 NOTE — Progress Notes (Signed)
Occupational Therapy Treatment and Discharge Patient Details Name: Kenneth [name] MRN: [id number] DOB: [birth year] Today's Date: 02/10/2020    History of present illness 59 yo admitted with COPD exacerbation. PMHx: COPD, DM, HTN, AFib, CHF, ETOh abuse, depression   OT comments  This 59 yo male admitted with above seen today to go over energy conservation handout with him and how he can work on his being short of breath with purse lipped breathing. No further OT needs, we will sign off.   Follow Up Recommendations  No OT follow up    Equipment Recommendations  None recommended by OT       Precautions / Restrictions Precautions Precautions: None Restrictions Weight Bearing Restrictions: No       Mobility Bed Mobility Overal bed mobility: Independent                       ADL either performed or assessed with clinical judgement   ADL                                         General ADL Comments: Went over energy conservation handout in full detail of the areas that pertained to him. At the end of this review with him I pointed out that if nothing else he needs to take from the handout that it takes less energy and thus less impact on breathing if he does as much of an activity as he can in sitting instead of standing, to avoid situations that will increase his body temperature (ie: hot showers, getting into a hot car, having activities he needs to do later in day when it is hot--schedule them eariler), and use the purse lip breathing in increments of 5 when he feels SOB.     Vision Patient Visual Report: No change from baseline            Cognition Arousal/Alertness: Awake/alert Behavior During Therapy: WFL for tasks assessed/performed Overall Cognitive Status: Within Functional Limits for tasks assessed                                                     Pertinent Vitals/ Pain       Pain Assessment: No/denies pain        Frequency  Min 2X/week        Progress Toward Goals  OT Goals(current goals can now be found in the care plan section)  Progress towards OT goals: Progressing toward goals  Acute Rehab OT Goals Patient Stated Goal: reduce admissions OT Goal Formulation: With patient/family Time For Goal Achievement: 02/23/20 Potential to Achieve Goals: Good  Plan Discharge plan remains appropriate       AM-PAC OT "6 Clicks" Daily Activity     Outcome Measure   Help from another person eating meals?: None Help from another person taking care of personal grooming?: None Help from another person toileting, which includes using toliet, bedpan, or urinal?: None Help from another person bathing (including washing, rinsing, drying)?: None Help from another person to put on and taking off regular upper body clothing?: None Help from another person to put on and taking off regular lower body clothing?: None 6 Click Score: 24    End of Session  OT Visit Diagnosis: Muscle weakness (generalized) (M62.81)   Activity Tolerance Patient tolerated treatment well   Patient Left in bed;with call bell/phone within reach;with bed alarm set   Nurse Communication  RN OK'd me to work with patient.        Time: 7793-9688 OT Time Calculation (min): 20 min  Charges: OT General Charges $OT Visit: 1 Visit OT Treatments $Self Care/Home Management : 8-22 mins  [place] [address], OTR/L Acute [employer] Pager [phone] Office [phone]      Almon Register 02/10/2020, 4:40 PM

## 2020-02-10 NOTE — Discharge Summary (Addendum)
Physician Discharge Summary  Halton Neas FYB:[id number] DOB: 12-05-1960 DOA: 02/08/2020  PCP: [name], MD  Admit date: 02/08/2020 Discharge date: 02/10/2020  Admitted From: Home Disposition: Home  Recommendations for Outpatient Follow-up:  1. Follow up with PCP in 1-2 weeks 2. Please obtain BMP/CBC in one week  Home Health: No Equipment/Devices: No  Discharge Condition: Stable CODE STATUS: Full Diet recommendation: Heart Healthy / Carb Modified  Brief/Interim Summary: Kenneth [name] a 59 y.o.malewith medical history significant ofCOPD, diabetes, hypertension, paroxysmal atrial fibrillation, diastolic dysfunction CHF and previous alcohol abuse,depression, who presented to the ER with significant shortness of breath cough and wheezing. He had been taking his medications with no relief. In the ER he was found to be tachypneic significantly short of breath. Treatment administered including multiple doses of albuterol and steroids did not relieve symptoms. Patient was placed on BiPAP. He has had some partial relief but still symptomatic.  Therefore admitted withacute respiratory failure secondary to COPD exacerbation. No evidence of pneumonia on chest x-ray.  Labs in ED were unremarkable.COVID-19 screen is negative. Chest x-ray showed no acute findings. EKG showed sinus tachycardia otherwise no significant findings.   Following is the hospital course in problem list format: #1 acute on chronic respiratory failure with hypoxia:Secondary to COPD exacerbation. Patient was previously on BiPAP.  At this time he is needing nightly BiPAP.  He was previously on oxygen but today stable on room air and holding oxygen saturation on ambulation. He was given cefepime, azithromycin.    He has been afebrile. Mild if any sputum production per patient.  Therefore do not think he needs antibiotics at this time.  His symptoms have significantly improved.  He will be discharged home  on p.o. prednisone taper.  He will continue his inhalers and as needed nebulizers. -Patient has CPAP at home but requesting BiPAP.  Contacted case Freight forward[employer] and per the case manager [facility] will see the patient at home and arrange his BiPAP. -We will also provide referral to outpatient pulmonary.  #2 hypertension: He is on Norvasc, Coreg, Cozaar, Lasix.  He was still tachycardic. Therefore increased dosage of Coreg.  He also had elevated creatinine.  Therefore discontinued the Lasix.  Blood pressure continued to be high despite these changes therefore added Imdur.  After addition of Imdur blood pressure at this time stable.  Patient advised to follow-up with his outpatient physician to have his blood pressure monitored in the outpatient setting and adjust medications as needed.  #3 diabetes: On Metformin.  Expect the blood glucose to be elevated while on steroids.  He is advised to monitor his blood glucose closely.  Patient also advised to follow-up with his primary care physician.  #4 paroxysmal atrial fibrillation: Has had tachycardia.    Therefore increased the dose of Coreg.  Heart rate stable after increasing the dose.  He was advised to follow-up with his outpatient physician.  He is also on chronic anticoagulation with Eliquis which will be continued.  #5history of alcohol abuse:No reported recent alcohol intake.Did not have any withdrawal and stable at this time.  He was counseled on abstaining from alcohol abuse.  #6 depression with anxiety: Continue home regimen.  Denies having any suicidal or homicidal ideations.  Stable at this time.  Advised to follow-up with his outpatient physician.  Discharge Diagnoses:  Principal Problem:   Acute on chronic respiratory failure with hypoxemia [facility]) Active Problems:   Essential hypertension   Controlled type 2 diabetes mellitus without complication, without long-term current use  of insulin ([place])   Depression with anxiety   Alcohol  abuse   PAF (paroxysmal atrial fibrillation) (HCC)   Chronic combined systolic and diastolic heart failure (HCC)   Gastroesophageal reflux disease without esophagitis   Tobacco abuse    Discharge Instructions Discharge plan of care discussed with the patient and wife at the bedside.  Answered their questions appropriately to the best of my knowledge.  They verbalized clear understanding and is being discharged home in a stable condition.  Allergies as of 02/10/2020      Reactions   Lisinopril Swelling   Other Other (See Comments)   Lettuce : rash   Tomato Rash      Medication List    STOP taking these medications   BC HEADACHE POWDER PO   furosemide 20 MG tablet Commonly known as: LASIX   nicotine 7 mg/24hr patch Commonly known as: NICODERM CQ - dosed in mg/24 hr     TAKE these medications   albuterol 108 (90 Base) MCG/ACT inhaler Commonly known as: VENTOLIN HFA Inhale 1-2 puffs into the lungs every 4 (four) hours as needed for wheezing or shortness of breath.   albuterol (2.5 MG/3ML) 0.083% nebulizer solution Commonly known as: PROVENTIL Take 3 mLs (2.5 mg total) by nebulization 5 (five) times daily as needed for wheezing or shortness of breath.   amLODipine 10 MG tablet Commonly known as: NORVASC Take 1 tablet (10 mg total) by mouth daily.   apixaban 5 MG Tabs tablet Commonly known as: Eliquis Take 1 tablet (5 mg total) by mouth 2 (two) times daily.   aspirin EC 81 MG tablet Take 81 mg by mouth daily.   busPIRone 15 MG tablet Commonly known as: BUSPAR Take 1 tablet (15 mg total) by mouth 2 (two) times daily.   carvedilol 12.5 MG tablet Commonly known as: COREG Take 1 tablet (12.5 mg total) by mouth 2 (two) times daily with a meal. Start taking on: Feb 11, 2020 What changed:   medication strength  when to take this   famotidine 20 MG tablet Commonly known as: PEPCID TAKE 1 TABLET BY MOUTH  DAILY   FLUoxetine 20 MG capsule Commonly known as:  PROZAC TAKE 3 CAPSULES BY MOUTH  DAILY   Fluticasone-Salmeterol 250-50 MCG/DOSE Aepb Commonly known as: Advair Diskus Inhale 1 puff into the lungs 2 (two) times daily.   ipratropium 0.02 % nebulizer solution Commonly known as: ATROVENT Take 2.5 mLs (0.5 mg total) by nebulization 4 (four) times daily.   isosorbide mononitrate 30 MG 24 hr tablet Commonly known as: IMDUR Take 1 tablet (30 mg total) by mouth daily. Start taking on: Feb 11, 2020   losartan 25 MG tablet Commonly known as: COZAAR Take 1 tablet (25 mg total) by mouth daily. Please schedule annual appt with Dr. Harrell Gave for refills. [phone]. 3rd attempt. What changed: additional instructions   metFORMIN 500 MG tablet Commonly known as: GLUCOPHAGE TAKE 1 TABLET BY MOUTH TWO  TIMES DAILY WITH MEALS What changed: See the new instructions.   multivitamin with minerals Tabs tablet Take 1 tablet by mouth daily.   nitroGLYCERIN 0.4 MG SL tablet Commonly known as: NITROSTAT DISSOLVE 1 TABLET UNDER THE TONGUE EVERY 5 MINUTES AS  NEEDED FOR CHEST PAIN. MAX  OF 3 TABLETS IN 15 MINUTES. CALL 911 IF PAIN PERSISTS. What changed: See the new instructions.   ONE TOUCH ULTRA 2 w/Device Kit Use to test blood sugars 1-2 times daily.   OneTouch Ultra test strip Generic drug:  glucose blood Use to test blood sugars 1-2 times daily.   onetouch ultrasoft lancets Use to test blood sugars 1-2 times daily.   predniSONE 10 MG (21) Tbpk tablet Commonly known as: STERAPRED UNI-PAK 21 TAB Take 6 tabs by mouth for 2 days, then 5 tabs for 2 days, then 4 tabs for 2 days, then 3 tabs for 2 days, 2 tabs for 2 days, then 1 tabfor 2 days   traZODone 50 MG tablet Commonly known as: DESYREL TAKE 1/2 TO 1 TABLET BY  MOUTH AT BEDTIME AS NEEDED  FOR SLEEP. What changed:   how much to take  reasons to take this  additional instructions       Allergies  Allergen Reactions  . Lisinopril Swelling  . Other Other (See Comments)     Lettuce : rash  . Tomato Rash    Consultations:  None   Procedures/Studies: DG Chest Portable 1 View  Result Date: 02/08/2020 CLINICAL DATA:  Difficulty breathing for 2 weeks, shortness of breath EXAM: PORTABLE CHEST 1 VIEW COMPARISON:  08/04/2019 FINDINGS: 2 frontal views of the chest demonstrate an unremarkable cardiac silhouette. No airspace disease, effusion, or pneumothorax. No acute bony abnormalities. IMPRESSION: 1. No acute intrathoracic process. Electronically Signed   By: Randa Ngo M.D.   On: 02/08/2020 16:47      Subjective: He says he feels a lot better.  Denies having any major complaints at this time.  Discharge Exam: Vitals:   02/10/20 1205 02/10/20 1544  BP:  (!) 141/76  Pulse:  63  Resp:  16  Temp:  98 F (36.7 C)  SpO2: 97% 96%   Vitals:   02/10/20 0721 02/10/20 1035 02/10/20 1205 02/10/20 1544  BP: (!) 155/91 (!) 144/91  (!) 141/76  Pulse: 73 63  63  Resp: 20 16  16   Temp: 97.6 F (36.4 C) 97.6 F (36.4 C)  98 F (36.7 C)  TempSrc: Oral Axillary  Oral  SpO2: 94% 99% 97% 96%  Weight:      Height:        General: Pt is alert, awake, not in acute distress Cardiovascular: RRR, S1/S2 +, no rubs, no gallops Respiratory: Improved air entry, occasional wheezing, no rhonchi Abdominal: Soft, NT, ND, bowel sounds + Extremities: no edema, no cyanosis    The results of significant diagnostics from this hospitalization (including imaging, microbiology, ancillary and laboratory) are listed below for reference.     Microbiology: Recent Results (from the past 240 hour(s))  SARS Coronavirus 2 by RT PCR (hospital order, performed in [facility] hospital lab) Nasopharyngeal Nasopharyngeal Swab     Status: None   Collection Time: 02/08/20  4:23 PM   Specimen: Nasopharyngeal Swab  Result Value Ref Range Status   SARS Coronavirus 2 NEGATIVE NEGATIVE Final    Comment: (NOTE) SARS-CoV-2 target nucleic acids are NOT DETECTED. The SARS-CoV-2 RNA is  generally detectable in upper and lower respiratory specimens during the acute phase of infection. The lowest concentration of SARS-CoV-2 viral copies this assay can detect is 250 copies / mL. A negative result does not preclude SARS-CoV-2 infection and should not be used as the sole basis for treatment or other patient management decisions.  A negative result may occur with improper specimen collection / handling, submission of specimen other than nasopharyngeal swab, presence of viral mutation(s) within the areas targeted by this assay, and inadequate number of viral copies (<250 copies / mL). A negative result must be combined with clinical observations, patient  history, and epidemiological information. Fact Sheet for Patients:   StrictlyIdeas.no Fact Sheet for Healthcare Providers: BankingDealers.co.za This test is not yet approved or cleared  by the [place] FDA and has been authorized for detection and/or diagnosis of SARS-CoV-2 by FDA under an Emergency Use Authorization (EUA).  This EUA will remain in effect (meaning this test can be used) for the duration of the COVID-19 declaration under Section 564(b)(1) of the Act, 21 U.S.C. section 360bbb-3(b)(1), unless the authorization is terminated or revoked sooner. Performed at [facility], [place] [address] [place], [place] [address]   MRSA PCR Screening     Status: None   Collection Time: 02/09/20  5:30 AM   Specimen: Nasal Mucosa; Nasopharyngeal  Result Value Ref Range Status   MRSA by PCR NEGATIVE NEGATIVE Final    Comment:        The GeneXpert MRSA Assay (FDA approved for NASAL specimens only), is one component of a comprehensive MRSA colonization surveillance program. It is not intended to diagnose MRSA infection nor to guide or monitor treatment for MRSA infections. Performed at [facility], [place] [address] [place], [place] [address]      Labs: BNP  (last 3 results) Recent Labs    06/12/19 1125 02/08/20 1611  BNP 784.0* 195.0*   Basic Metabolic Panel: Recent Labs  Lab 02/08/20 1611 02/08/20 1731 02/09/20 0358 02/10/20 0300  NA 138 137 135 136  K 4.2 3.9 4.3 4.3  CL 96*  --  96* 98  CO2 23  --  22 29  GLUCOSE 71  --  148* 159*  BUN 11  --  20 20  CREATININE 1.12  --  1.38* 1.23  CALCIUM 9.3  --  8.9 9.0   Liver Function Tests: Recent Labs  Lab 02/08/20 1611  AST 30  ALT 15  ALKPHOS 64  BILITOT 0.7  PROT 7.2  ALBUMIN 4.0   No results for input(s): LIPASE, AMYLASE in the last 168 hours. No results for input(s): AMMONIA in the last 168 hours. CBC: Recent Labs  Lab 02/08/20 1611 02/08/20 1731 02/09/20 0358 02/10/20 0300  WBC 6.4  --  4.2 6.7  NEUTROABS 3.7  --  3.9  --   HGB 14.3 14.6 12.8* 13.0  HCT 45.0 43.0 40.2 39.5  MCV 95.3  --  95.7 93.8  PLT 276  --  232 223   Cardiac Enzymes: No results for input(s): CKTOTAL, CKMB, CKMBINDEX, TROPONINI in the last 168 hours. BNP: Invalid input(s): POCBNP CBG: Recent Labs  Lab 02/09/20 1548 02/09/20 2131 02/10/20 0620 02/10/20 1127 02/10/20 1623  GLUCAP 139* 181* 189* 133* 152*   D-Dimer No results for input(s): DDIMER in the last 72 hours. Hgb A1c Recent Labs    02/08/20 2241  HGBA1C 6.3*   Lipid Profile No results for input(s): CHOL, HDL, LDLCALC, TRIG, CHOLHDL, LDLDIRECT in the last 72 hours. Thyroid function studies No results for input(s): TSH, T4TOTAL, T3FREE, THYROIDAB in the last 72 hours.  Invalid input(s): FREET3 Anemia work up No results for input(s): VITAMINB12, FOLATE, FERRITIN, TIBC, IRON, RETICCTPCT in the last 72 hours. Urinalysis    Component Value Date/Time   COLORURINE YELLOW 05/20/2019 0825   APPEARANCEUR CLEAR 05/20/2019 0825   LABSPEC 1.020 05/20/2019 0825   PHURINE 6.0 05/20/2019 0825   GLUCOSEU NEGATIVE 05/20/2019 0825   HGBUR NEGATIVE 05/20/2019 0825   BILIRUBINUR SMALL (A) 05/20/2019 0825   KETONESUR TRACE (A)  05/20/2019 0825   UROBILINOGEN 1.0 05/20/2019  0825   NITRITE NEGATIVE 05/20/2019 0825   LEUKOCYTESUR NEGATIVE 05/20/2019 0825   Sepsis Labs Invalid input(s): PROCALCITONIN,  WBC,  LACTICIDVEN Microbiology Recent Results (from the past 240 hour(s))  SARS Coronavirus 2 by RT PCR (hospital order, performed in [facility] hospital lab) Nasopharyngeal Nasopharyngeal Swab     Status: None   Collection Time: 02/08/20  4:23 PM   Specimen: Nasopharyngeal Swab  Result Value Ref Range Status   SARS Coronavirus 2 NEGATIVE NEGATIVE Final    Comment: (NOTE) SARS-CoV-2 target nucleic acids are NOT DETECTED. The SARS-CoV-2 RNA is generally detectable in upper and lower respiratory specimens during the acute phase of infection. The lowest concentration of SARS-CoV-2 viral copies this assay can detect is 250 copies / mL. A negative result does not preclude SARS-CoV-2 infection and should not be used as the sole basis for treatment or other patient management decisions.  A negative result may occur with improper specimen collection / handling, submission of specimen other than nasopharyngeal swab, presence of viral mutation(s) within the areas targeted by this assay, and inadequate number of viral copies (<250 copies / mL). A negative result must be combined with clinical observations, patient history, and epidemiological information. Fact Sheet for Patients:   StrictlyIdeas.no Fact Sheet for Healthcare Providers: BankingDealers.co.za This test is not yet approved or cleared  by the [place] FDA and has been authorized for detection and/or diagnosis of SARS-CoV-2 by FDA under an Emergency Use Authorization (EUA).  This EUA will remain in effect (meaning this test can be used) for the duration of the COVID-19 declaration under Section 564(b)(1) of the Act, 21 U.S.C. section 360bbb-3(b)(1), unless the authorization is terminated or revoked  sooner. Performed at [facility], [place] [address], [place], [place] [address]   MRSA PCR Screening     Status: None   Collection Time: 02/09/20  5:30 AM   Specimen: Nasal Mucosa; Nasopharyngeal  Result Value Ref Range Status   MRSA by PCR NEGATIVE NEGATIVE Final    Comment:        The GeneXpert MRSA Assay (FDA approved for NASAL specimens only), is one component of a comprehensive MRSA colonization surveillance program. It is not intended to diagnose MRSA infection nor to guide or monitor treatment for MRSA infections. Performed at [facility], [place] [address], [place], [place] [address]      Time coordinating discharge: Over 30 minutes  SIGNED:   Yaakov Guthrie, MD  [facility] 02/10/2020, 5:26 PM Pager on amion  If 7PM-7AM, please contact night-coverage [email or web address] Password [email or web address]

## 2020-02-10 NOTE — Plan of Care (Signed)
  Problem: Education: Goal: Knowledge of General Education information will improve Description: Including pain rating scale, medication(s)/side effects and non-pharmacologic comfort measures Outcome: Completed/Met   Problem: Health Behavior/Discharge Planning: Goal: Ability to manage health-related needs will improve Outcome: Completed/Met   Problem: Clinical Measurements: Goal: Ability to maintain clinical measurements within normal limits will improve 02/10/2020 1707 by Don Perking, RN Outcome: Completed/Met 02/10/2020 0856 by Don Perking, RN Outcome: Progressing Goal: Will remain free from infection 02/10/2020 1707 by Don Perking, RN Outcome: Completed/Met 02/10/2020 0856 by Don Perking, RN Outcome: Progressing Goal: Diagnostic test results will improve 02/10/2020 1707 by Don Perking, RN Outcome: Completed/Met 02/10/2020 0856 by Don Perking, RN Outcome: Progressing Goal: Respiratory complications will improve 02/10/2020 1707 by Don Perking, RN Outcome: Completed/Met 02/10/2020 0856 by Don Perking, RN Outcome: Progressing Goal: Cardiovascular complication will be avoided 02/10/2020 1707 by Don Perking, RN Outcome: Completed/Met 02/10/2020 0856 by Don Perking, RN Outcome: Progressing   Problem: Activity: Goal: Risk for activity intolerance will decrease 02/10/2020 1707 by Don Perking, RN Outcome: Completed/Met 02/10/2020 0856 by Don Perking, RN Outcome: Progressing   Problem: Nutrition: Goal: Adequate nutrition will be maintained Outcome: Completed/Met   Problem: Coping: Goal: Level of anxiety will decrease 02/10/2020 1707 by Don Perking, RN Outcome: Completed/Met 02/10/2020 0856 by Don Perking, RN Outcome: Progressing   Problem: Elimination: Goal: Will not experience complications related to bowel motility Outcome: Completed/Met Goal: Will not experience complications  related to urinary retention Outcome: Completed/Met   Problem: Pain Managment: Goal: General experience of comfort will improve 02/10/2020 1707 by Don Perking, RN Outcome: Completed/Met 02/10/2020 0856 by Don Perking, RN Outcome: Progressing   Problem: Safety: Goal: Ability to remain free from injury will improve Outcome: Completed/Met   Problem: Skin Integrity: Goal: Risk for impaired skin integrity will decrease 02/10/2020 1707 by Don Perking, RN Outcome: Completed/Met 02/10/2020 0856 by Don Perking, RN Outcome: Progressing   Problem: Education: Goal: Knowledge of disease or condition will improve 02/10/2020 1707 by Don Perking, RN Outcome: Completed/Met 02/10/2020 0856 by Don Perking, RN Outcome: Progressing Goal: Knowledge of the prescribed therapeutic regimen will improve 02/10/2020 1707 by Don Perking, RN Outcome: Completed/Met 02/10/2020 0856 by Don Perking, RN Outcome: Progressing Goal: Individualized Educational Video(s) 02/10/2020 1707 by Don Perking, RN Outcome: Completed/Met 02/10/2020 0856 by Don Perking, RN Outcome: Progressing   Problem: Activity: Goal: Ability to tolerate increased activity will improve 02/10/2020 1707 by Don Perking, RN Outcome: Completed/Met 02/10/2020 0856 by Don Perking, RN Outcome: Progressing Goal: Will verbalize the importance of balancing activity with adequate rest periods 02/10/2020 1707 by Don Perking, RN Outcome: Completed/Met 02/10/2020 0856 by Don Perking, RN Outcome: Progressing   Problem: Respiratory: Goal: Ability to maintain a clear airway will improve 02/10/2020 1707 by Don Perking, RN Outcome: Completed/Met 02/10/2020 0856 by Don Perking, RN Outcome: Progressing Goal: Levels of oxygenation will improve 02/10/2020 1707 by Don Perking, RN Outcome: Completed/Met 02/10/2020 0856 by Don Perking,  RN Outcome: Progressing Goal: Ability to maintain adequate ventilation will improve 02/10/2020 1707 by Don Perking, RN Outcome: Completed/Met 02/10/2020 0856 by Don Perking, RN Outcome: Progressing

## 2020-02-10 NOTE — Plan of Care (Signed)
  Problem: Clinical Measurements: Goal: Ability to maintain clinical measurements within normal limits will improve Outcome: Progressing Goal: Will remain free from infection Outcome: Progressing Goal: Diagnostic test results will improve Outcome: Progressing Goal: Respiratory complications will improve Outcome: Progressing Goal: Cardiovascular complication will be avoided Outcome: Progressing   Problem: Activity: Goal: Risk for activity intolerance will decrease Outcome: Progressing   Problem: Coping: Goal: Level of anxiety will decrease Outcome: Progressing   Problem: Pain Managment: Goal: General experience of comfort will improve Outcome: Progressing   Problem: Skin Integrity: Goal: Risk for impaired skin integrity will decrease Outcome: Progressing   Problem: Education: Goal: Knowledge of disease or condition will improve Outcome: Progressing Goal: Knowledge of the prescribed therapeutic regimen will improve Outcome: Progressing Goal: Individualized Educational Video(s) Outcome: Progressing   Problem: Activity: Goal: Ability to tolerate increased activity will improve Outcome: Progressing Goal: Will verbalize the importance of balancing activity with adequate rest periods Outcome: Progressing   Problem: Respiratory: Goal: Ability to maintain a clear airway will improve Outcome: Progressing Goal: Levels of oxygenation will improve Outcome: Progressing Goal: Ability to maintain adequate ventilation will improve Outcome: Progressing

## 2020-02-13 ENCOUNTER — Telehealth: Payer: Self-pay | Admitting: *Deleted

## 2020-02-13 ENCOUNTER — Other Ambulatory Visit: Payer: Self-pay

## 2020-02-13 ENCOUNTER — Telehealth: Payer: Self-pay | Admitting: Family Medicine

## 2020-02-13 NOTE — Telephone Encounter (Signed)
Patient sgo is calling and wanted to see if an order has been sent for patient to get a bipap machine sent to [facility]. CB is [phone]

## 2020-02-13 NOTE — Telephone Encounter (Signed)
1st attempt. Unable to reach patient. Pt has hospital follow up appt scheduled w/ PCP 02/14/20.

## 2020-02-13 NOTE — Telephone Encounter (Signed)
Pt calling stating that he needs to speak to someone now about his bipap. I asked for his name and DOB to pull up his account and check on the status. I advised him Dr. Doreene Burke is with a pt. He said "I don't give a s**t". I asked the pt to refrain from using this language. He apologized and said he is concerned about his breathing and has taken 2 treatments and needs his oxygen. It appears it is bipap. Transferred to [employer].

## 2020-02-14 ENCOUNTER — Ambulatory Visit (INDEPENDENT_AMBULATORY_CARE_PROVIDER_SITE_OTHER): Payer: Medicare Other | Admitting: Family Medicine

## 2020-02-14 ENCOUNTER — Other Ambulatory Visit: Payer: Self-pay | Admitting: *Deleted

## 2020-02-14 ENCOUNTER — Encounter: Payer: Self-pay | Admitting: Family Medicine

## 2020-02-14 VITALS — BP 148/76 | HR 64 | Temp 97.7°F | Ht 72.0 in | Wt 171.9 lb

## 2020-02-14 DIAGNOSIS — E119 Type 2 diabetes mellitus without complications: Secondary | ICD-10-CM | POA: Diagnosis not present

## 2020-02-14 DIAGNOSIS — J449 Chronic obstructive pulmonary disease, unspecified: Secondary | ICD-10-CM

## 2020-02-14 LAB — BASIC METABOLIC PANEL
BUN: 18 mg/dL (ref 6–23)
CO2: 34 mEq/L — ABNORMAL HIGH (ref 19–32)
Calcium: 9.1 mg/dL (ref 8.4–10.5)
Chloride: 98 mEq/L (ref 96–112)
Creatinine, Ser: 1.04 mg/dL (ref 0.40–1.50)
GFR: 88.57 mL/min (ref >60.00)
Glucose, Bld: 120 mg/dL — ABNORMAL HIGH (ref 70–99)
Potassium: 4.4 mEq/L (ref 3.5–5.1)
Sodium: 137 mEq/L (ref 135–145)

## 2020-02-14 NOTE — Telephone Encounter (Signed)
Spoke with patient who verbally understood per doctors orders he will need to be referred to pulmonology. Appointment scheduled for patient to see Dr. Doreene Burke for follow up from hospital and to go over referrals for Bipap.

## 2020-02-14 NOTE — Progress Notes (Signed)
Established Patient Office Visit  Subjective:  Patient ID: Kenneth [name], male    DOB: 03/30/1961  Age: 59 y.o. MRN: [id number]  CC:  Chief Complaint  Patient presents with  . Hospitalization Follow-up    hospital follow up for cough and SOB, pt would like to know why they stopped L[place].    HPI Kenneth [name] presents for hospital discharge follow-up status post acute on chronic respiratory failure with hypoxemia.  Tells that he had been sitting out on his front porch for 2 to 3 days prior to his hospitalization and believes that perhaps the change in temperature and pollen got the best of him.  He has remained afebrile.  There is no sputum.  He is doing better since his hospitalization.  He continues his prednisone and will complete therapy.  He had received the BiPAP therapy with oxygen and slept better with this he would like to continue this.  He has already been referred to pulmonology by the hospital doctors.  Blood pressure was well controlled.  Lasix was temporarily discontinued secondary to a rise in his creatinine.  Most recent BMP just before discharge shows that his creatinine is back into the normal range.  It will be checked again today.  Blood pressures well controlled and pulse rate is well controlled as well status post increase of carvedilol during his hospitalization.  Hemoglobin A1c was measured at 6.36 days ago.  He continues with [name].  Diabetes is well controlled.  Past Medical History:  Diagnosis Date  . Arthritis   . Asthma   . Atrial fibrillation ([place])   . CHF (congestive heart failure) ([place])   . Depression   . Diabetes mellitus without complication ([place])   . Emphysema of lung ([place])   . Heart murmur   . Hyperlipidemia   . Hypertension     History reviewed. No pertinent surgical history.  Family History  Problem Relation Age of Onset  . Diabetes Sister   . Coronary artery disease Brother     Social History   Socioeconomic History  . Marital status:  Legally Separated    Spouse name: Not on file  . Number of children: Not on file  . Years of education: Not on file  . Highest education level: Not on file  Occupational History  . Not on file  Tobacco Use  . Smoking status: Current Some Day Smoker    Packs/day: 0.25    Types: Cigarettes  . Smokeless tobacco: Never Used  Substance and Sexual Activity  . Alcohol use: Yes    Comment: Drinks up to six beers around a game  . Drug use: Yes    Types: Marijuana    Comment: occ marijuana  . Sexual activity: Yes    Partners: Female  Other Topics Concern  . Not on file  Social History Narrative  . Not on file   Social Determinants of Health   Financial Resource Strain:   . Difficulty of Paying Living Expenses:   Food Insecurity:   . Worried About Charity fundraiser in the Last Year:   . Arboriculturist in the Last Year:   Transportation Needs:   . Film/video editor (Medical):   [name] Lack of Transportation (Non-Medical):   Physical Activity:   . Days of Exercise per Week:   . Minutes of Exercise per Session:   Stress:   . Feeling of Stress :   Social Connections:   . Frequency of Communication with Friends  and Family:   . Frequency of Social Gatherings with Friends and Family:   . Attends Religious Services:   . Active Member of Clubs or Organizations:   . Attends Archivist Meetings:   [name] Marital Status:   Intimate Partner Violence:   . Fear of Current or Ex-Partner:   . Emotionally Abused:   [name] Physically Abused:   . Sexually Abused:     Outpatient Medications Prior to Visit  Medication Sig Dispense Refill  . albuterol (PROVENTIL HFA;VENTOLIN HFA) 108 (90 Base) MCG/ACT inhaler Inhale 1-2 puffs into the lungs every 4 (four) hours as needed for wheezing or shortness of breath.     [name] albuterol (PROVENTIL) (2.5 MG/3ML) 0.083% nebulizer solution Take 3 mLs (2.5 mg total) by nebulization 5 (five) times daily as needed for wheezing or shortness of breath. 150 mL 3    . amLODipine (NORVASC) 10 MG tablet Take 1 tablet (10 mg total) by mouth daily. 90 tablet 1  . apixaban (ELIQUIS) 5 MG TABS tablet Take 1 tablet (5 mg total) by mouth 2 (two) times daily. 180 tablet 1  . aspirin EC 81 MG tablet Take 81 mg by mouth daily.    . Blood Glucose Monitoring Suppl (ONE TOUCH ULTRA 2) w/Device KIT Use to test blood sugars 1-2 times daily. 1 kit 0  . busPIRone (BUSPAR) 15 MG tablet Take 1 tablet (15 mg total) by mouth 2 (two) times daily. 180 tablet 1  . carvedilol (COREG) 12.5 MG tablet Take 1 tablet (12.5 mg total) by mouth 2 (two) times daily with a meal. 60 tablet 0  . famotidine (PEPCID) 20 MG tablet TAKE 1 TABLET BY MOUTH  DAILY (Patient taking differently: Take 20 mg by mouth daily. ) 90 tablet 3  . FLUoxetine (PROZAC) 20 MG capsule TAKE 3 CAPSULES BY MOUTH  DAILY (Patient taking differently: Take 60 mg by mouth daily. ) 270 capsule 3  . Fluticasone-Salmeterol (ADVAIR DISKUS) 250-50 MCG/DOSE AEPB Inhale 1 puff into the lungs 2 (two) times daily. 1 each 3  . glucose blood (ONETOUCH ULTRA) test strip Use to test blood sugars 1-2 times daily. 100 each 12  . ipratropium (ATROVENT) 0.02 % nebulizer solution Take 2.5 mLs (0.5 mg total) by nebulization 4 (four) times daily. 75 mL 12  . isosorbide mononitrate (IMDUR) 30 MG 24 hr tablet Take 1 tablet (30 mg total) by mouth daily. 30 tablet 0  . Lancets (ONETOUCH ULTRASOFT) lancets Use to test blood sugars 1-2 times daily. 100 each 12  . losartan (COZAAR) 25 MG tablet Take 1 tablet (25 mg total) by mouth daily. Please schedule annual appt with Dr. Harrell Gave for refills. [phone]. 3rd attempt. (Patient taking differently: Take 25 mg by mouth daily. ) 7 tablet 0  . metFORMIN (GLUCOPHAGE) 500 MG tablet TAKE 1 TABLET BY MOUTH TWO  TIMES DAILY WITH MEALS (Patient taking differently: Take 500 mg by mouth 2 (two) times daily with a meal. ) 180 tablet 3  . Multiple Vitamin (MULTIVITAMIN WITH MINERALS) TABS tablet Take 1 tablet by  mouth daily.    . nitroGLYCERIN (NITROSTAT) 0.4 MG SL tablet DISSOLVE 1 TABLET UNDER THE TONGUE EVERY 5 MINUTES AS  NEEDED FOR CHEST PAIN. MAX  OF 3 TABLETS IN 15 MINUTES. CALL 911 IF PAIN PERSISTS. (Patient taking differently: Place 0.4 mg under the tongue every 5 (five) minutes as needed for chest pain. ) 75 tablet 4  . predniSONE (STERAPRED UNI-PAK 21 TAB) 10 MG (21) TBPK tablet Take 6  tabs by mouth for 2 days, then 5 tabs for 2 days, then 4 tabs for 2 days, then 3 tabs for 2 days, 2 tabs for 2 days, then 1 tabfor 2 days 42 tablet 0  . traZODone (DESYREL) 50 MG tablet TAKE 1/2 TO 1 TABLET BY  MOUTH AT BEDTIME AS NEEDED  FOR SLEEP. (Patient taking differently: Take 50 mg by mouth at bedtime as needed for sleep. ) 30 tablet 11   No facility-administered medications prior to visit.    Allergies  Allergen Reactions  . Lisinopril Swelling  . Other Other (See Comments)    Lettuce : rash  . Tomato Rash    ROS Review of Systems  Constitutional: Negative for diaphoresis, fatigue, fever and unexpected weight change.  HENT: Negative.   Eyes: Negative for photophobia and visual disturbance.  Respiratory: Negative for chest tightness, shortness of breath and wheezing.   Cardiovascular: Negative for chest pain and palpitations.  Gastrointestinal: Negative.   Endocrine: Negative for polyphagia and polyuria.  Genitourinary: Negative.   Musculoskeletal: Negative for gait problem and joint swelling.  Skin: Negative for pallor and rash.  Allergic/Immunologic: Negative for immunocompromised state.  Neurological: Negative for headaches.  Hematological: Negative.   Psychiatric/Behavioral: Negative for dysphoric mood. The patient is not nervous/anxious.       Objective:    Physical Exam  Constitutional: He is oriented to person, place, and time. He appears well-developed and well-nourished. No distress.  HENT:  Head: Normocephalic and atraumatic.  Right Ear: External ear normal.  Left Ear:  External ear normal.  Eyes: Conjunctivae are normal. Right eye exhibits no discharge. Left eye exhibits no discharge. No scleral icterus.  Neck: No JVD present. No tracheal deviation present. No thyromegaly present.  Cardiovascular: Normal rate, regular rhythm and normal heart sounds.  Pulmonary/Chest: No stridor. No respiratory distress. He has decreased breath sounds. He has no wheezes. He has no rales.  Abdominal: Bowel sounds are normal.  Musculoskeletal:        General: No edema.  Lymphadenopathy:    He has no cervical adenopathy.  Neurological: He is alert and oriented to person, place, and time.  Skin: Skin is warm and dry. He is not diaphoretic.  Psychiatric: He has a normal mood and affect. His behavior is normal.    BP (!) 148/76   Pulse 64   Temp 97.7 F (36.5 C) (Tympanic)   Ht 6' (1.829 m)   Wt 171 lb 14.4 oz (78 kg)   SpO2 95%   BMI 23.31 kg/m  Wt Readings from Last 3 Encounters:  02/14/20 171 lb 14.4 oz (78 kg)  02/09/20 166 lb 7.2 oz (75.5 kg)  07/19/19 170 lb 9.6 oz (77.4 kg)     Health Maintenance Due  Topic Date Due  . FOOT EXAM  Never done  . COLONOSCOPY  09/23/2019    There are no preventive care reminders to display for this patient.  Lab Results  Component Value Date   TSH 4.32 07/06/2018   Lab Results  Component Value Date   WBC 6.7 02/10/2020   HGB 13.0 02/10/2020   HCT 39.5 02/10/2020   MCV 93.8 02/10/2020   PLT 223 02/10/2020   Lab Results  Component Value Date   NA 136 02/10/2020   K 4.3 02/10/2020   CO2 29 02/10/2020   GLUCOSE 159 (H) 02/10/2020   BUN 20 02/10/2020   CREATININE 1.23 02/10/2020   BILITOT 0.7 02/08/2020   ALKPHOS 64 02/08/2020   AST 30  02/08/2020   ALT 15 02/08/2020   PROT 7.2 02/08/2020   ALBUMIN 4.0 02/08/2020   CALCIUM 9.0 02/10/2020   ANIONGAP 9 02/10/2020   GFR 66.86 05/20/2019   Lab Results  Component Value Date   CHOL 226 (H) 07/06/2018   Lab Results  Component Value Date   HDL 68.40  07/06/2018   Lab Results  Component Value Date   LDLCALC 119 (H) 07/06/2018   Lab Results  Component Value Date   TRIG 190.0 (H) 07/06/2018   Lab Results  Component Value Date   CHOLHDL 3 07/06/2018   Lab Results  Component Value Date   HGBA1C 6.3 (H) 02/08/2020      Assessment & Plan:   Problem List Items Addressed This Visit      Respiratory   Chronic obstructive pulmonary disease ([place]) - Primary   Relevant Orders   Ambulatory referral to Pulmonology     Endocrine   Controlled type 2 diabetes mellitus without complication, without long-term current use of insulin (HCC)   Relevant Orders   Basic metabolic panel     Other   Shortness of breath      No orders of the defined types were placed in this encounter.   Follow-up: Return in about 3 months (around 05/16/2020).  Will finish prednisone taper.  Referral back to pulmonology for consideration of BiPAP therapy.  Continue Metformin for diabetes.  Follow-up in 3 months for recheck follow-up of depression and consideration for statin therapy.  [name], MD

## 2020-02-14 NOTE — Patient Outreach (Signed)
[name]) Care Management  02/14/2020  Kenneth [name] 07-09-1961 [id number]   RED ON EMMI ALERT - General Discharge Day # 1 Date: 5/24 Red Alert Reason: Haven't read discharge papers, Don't know who to call with changes in condition, No follow up scheduled, and other questions/problems   Outreach attempt #1, successful but he report this is not a good time to talk as he just arrived back home from his PCP visit.  Request to call this care manager back later today.   Plan: RN CM will send outreach letter and await call back.  If no call back, will follow up within the next 3-4 business days.  [name], [place], MSN [facility] Care Management  [facility] Manager [phone]

## 2020-02-14 NOTE — Telephone Encounter (Signed)
Appointment scheduled.

## 2020-02-15 ENCOUNTER — Other Ambulatory Visit: Payer: Self-pay | Admitting: *Deleted

## 2020-02-15 ENCOUNTER — Encounter: Payer: Self-pay | Admitting: *Deleted

## 2020-02-15 NOTE — Patient Outreach (Signed)
[facility]) Care Management  02/15/2020  Kenneth [name] [birth year] [id number]   RED ON EMMI ALERT - General Discharge Day # 1 Date: 5/24 Red Alert Reason: Haven't read discharge papers, Don't know who to call with changes in condition, No follow up scheduled, and other questions/problems  Call received from member after missed call yesterday, identity verified.  This care manager introduced self and stated purpose of call.  [facility] care management services explained.   Social: Report he is independent with ADL's, lives with significant other who helps manage his care when needed. Denies need for financial assistance with food or utilities.   Conditions: Per chart, has history of HTN, A-fib, CHF, GERD, DM (A1C - 6.3), and mild renal insufficiency.  He report monitoring blood sugar, blood pressure and weights daily. Today's readings were 170 pounds, 135, state blood pressure was "good."   Medications: Reviewed with member, denies need for assistance with management or affordability.     Appointments: Had follow up with PCP yesterday, will have new patient visit with pulmonologist on 6/24.  This visit is to assess for possible need for BIPAP.  Currently uses CPAP every night but state he feel he would benefit from the extra support.  Report he has read his discharge instructions and know who to contact with questions.  Already had follow up, denies any other urgent concerns.  Encouraged to contact this care manager with questions.   Plan: RN CM will follow up within the next 2 weeks.  Fall Risk  02/15/2020  Falls in the past year? 0  Number falls in past yr: 0  Injury with Fall? 0   Depression screen [facility] 2/9 02/15/2020 05/20/2019 11/15/2018 07/20/2018 07/06/2018  Decreased Interest 0 1 1 1 3   Down, Depressed, Hopeless 0 1 1 1 2   PHQ - 2 Score 0 2 2 2 5   Altered sleeping - 2 3 2 3   Tired, decreased energy - 1 2 1 2   Change in appetite - 1 2 1 2   Feeling bad or failure  about yourself  - 0 0 0 1  Trouble concentrating - 1 1 2 1   Moving slowly or fidgety/restless - 0 1 1 1   Suicidal thoughts - 0 0 0 0  PHQ-9 Score - 7 11 9 15      [facility] CM Care Plan Problem One     Most Recent Value  Care Plan Problem One  Knowledge deficit related to COPD management as evidenced by recent hospital admission  Role Documenting the Problem One  Care Management Coordinator  Care Plan for Problem One  Active  [facility] Long Term Goal   Member will not be admitted to hospital for COPD complications within the next 45 days  [facility] Long Term Goal Start Date  02/15/20  Interventions for Problem One Long Term Goal  Discharge AVS reviewed with member. Educated on maintaining safe precautions with Covid pandemic  [facility] CM Short Term Goal #1   Member wil report using CPAP daily over the next 2 weeks  [facility] CM Short Term Goal #1 Start Date  02/15/20  Interventions for Short Term Goal #1  Difference between CPAP and BIPAP discussed wtih member.   [facility] CM Short Term Goal #2   Member will report taking al medications as instructed over the next 2 weeks  [facility] CM Short Term Goal #2 Start Date  02/15/20  Interventions for Short Term Goal #2  Medication changes reviewed with member per AVS. Educated on importance of  taking as instructed and maintaining chronic medical conditions     Valente David, RN, MSN [place] Manager [phone]

## 2020-02-17 ENCOUNTER — Ambulatory Visit: Payer: Medicare Other | Admitting: *Deleted

## 2020-02-22 ENCOUNTER — Encounter: Payer: Self-pay | Admitting: *Deleted

## 2020-02-24 ENCOUNTER — Other Ambulatory Visit: Payer: Self-pay

## 2020-02-24 ENCOUNTER — Telehealth: Payer: Self-pay | Admitting: Family Medicine

## 2020-02-24 DIAGNOSIS — J441 Chronic obstructive pulmonary disease with (acute) exacerbation: Secondary | ICD-10-CM

## 2020-02-24 MED ORDER — ALBUTEROL SULFATE (2.5 MG/3ML) 0.083% IN NEBU: 2.5000 mg | INHALATION_SOLUTION | Freq: Every day | RESPIRATORY_TRACT | 3 refills | Status: DC | PRN

## 2020-02-24 MED ORDER — PREDNISONE 10 MG (21) PO TBPK: ORAL_TABLET | ORAL | 0 refills | Status: DC

## 2020-02-24 NOTE — Telephone Encounter (Signed)
Patient aware that Rx sent in and states that he does have an upcoming appointment for Pulmonary.

## 2020-02-24 NOTE — Telephone Encounter (Signed)
Patient calling for a refill on Albuterol solution and another round of Prenisone. Patient was seen at ED for respiratory issues and had a hospital follow up visit here. Per patient he still does not seem to be at his best and feel like another of medications would help clear his symptoms. Please advise

## 2020-02-24 NOTE — Telephone Encounter (Signed)
Patient is calling and requesting a refill for albuterol nebulizer solution. Also patient stated that he was just seen and has completed a round of prednisone and still has a cough. Patient asked if another round could be sent to [facility] on [address]. CB is [phone]

## 2020-02-27 ENCOUNTER — Other Ambulatory Visit: Payer: Self-pay | Admitting: *Deleted

## 2020-02-27 NOTE — Patient Outreach (Signed)
[facility]) Care Management  02/27/2020  Kristy Catoe 02/10/1961 [id number]   Call placed to member to follow up on management of COPD and to complete initial assessment, no answer. HIPAA compliant voice message left.  Will follow up within the next 3-4 business days.  [name], [place], MSN [facility] Care Management  [facility] Manager [phone]

## 2020-02-28 IMAGING — CR DG CHEST 1V PORT
1 series · 1 of 1 positions shown · non-contrast
Comparison: 07/27/2018

CLINICAL DATA: Shortness of breath

EXAM:
PORTABLE CHEST 1 VIEW

[AP]
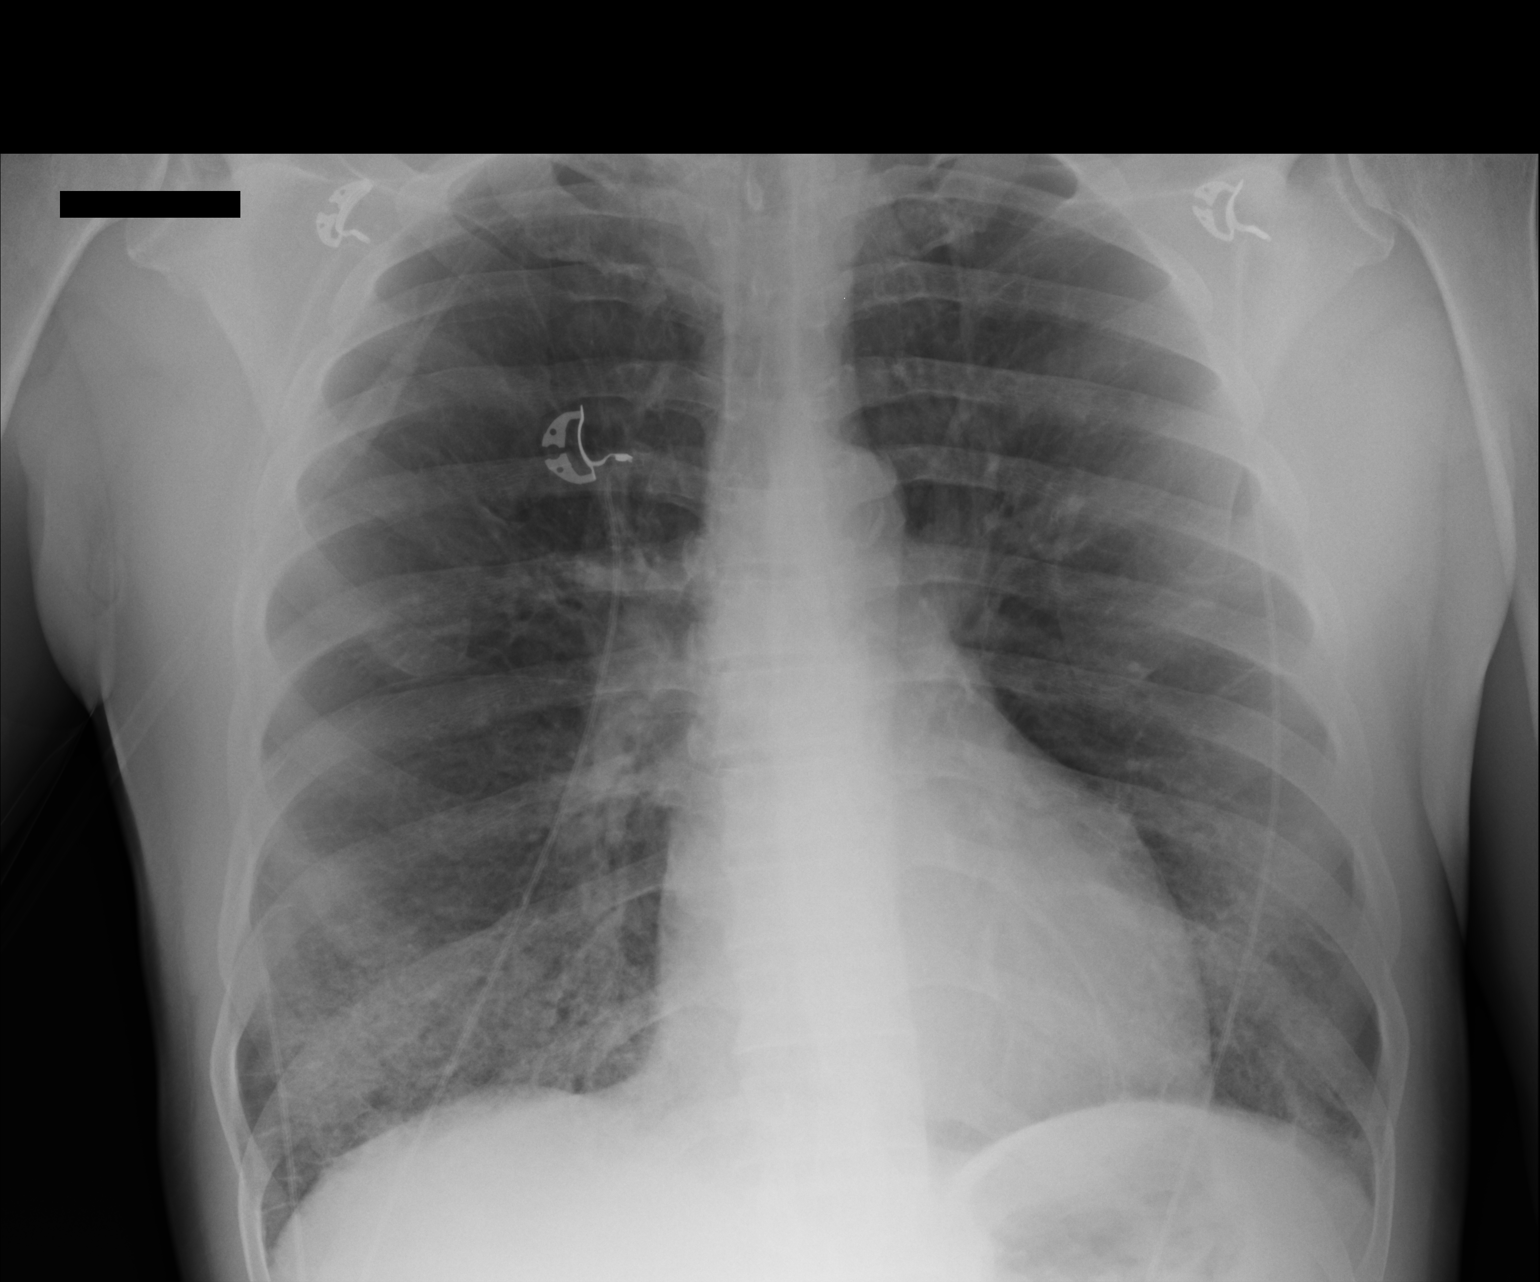

[1 of 1 positions shown; findings below may reference images not displayed]

FINDINGS: Indistinct bibasilar airspace opacities, right greater than left.
Faint Kerley B-lines peripherally at the right lung base.
Atherosclerotic calcification of the aortic arch. Heart size within
normal limits.
IMPRESSION: 1. Indistinct basilar airspace opacities, right greater than left,
with faint Kerley B-lines peripherally at the right lung base but no
overt cardiomegaly. The appearance could reflect noncardiogenic
edema, drug reaction, early multilobar pneumonia, or aspiration
pneumonitis.
2.  Aortic Atherosclerosis ([id number]).

## 2020-03-01 ENCOUNTER — Telehealth: Payer: Self-pay | Admitting: Family Medicine

## 2020-03-01 NOTE — Telephone Encounter (Signed)
error 

## 2020-03-03 ENCOUNTER — Emergency Department (HOSPITAL_COMMUNITY): Payer: Medicare Other

## 2020-03-03 ENCOUNTER — Other Ambulatory Visit: Payer: Self-pay

## 2020-03-03 ENCOUNTER — Emergency Department (HOSPITAL_COMMUNITY)
Admission: EM | Admit: 2020-03-03 | Discharge: 2020-03-03 | Disposition: A | Discharge status: Home / Self Care | Payer: Medicare Other | Attending: Emergency Medicine | Admitting: Emergency Medicine

## 2020-03-03 DIAGNOSIS — Z0389 Encounter for observation for other suspected diseases and conditions ruled out: Secondary | ICD-10-CM | POA: Diagnosis not present

## 2020-03-03 DIAGNOSIS — F17210 Nicotine dependence, cigarettes, uncomplicated: Secondary | ICD-10-CM | POA: Diagnosis not present

## 2020-03-03 DIAGNOSIS — E119 Type 2 diabetes mellitus without complications: Secondary | ICD-10-CM | POA: Insufficient documentation

## 2020-03-03 DIAGNOSIS — I509 Heart failure, unspecified: Secondary | ICD-10-CM | POA: Insufficient documentation

## 2020-03-03 DIAGNOSIS — Z7901 Long term (current) use of anticoagulants: Secondary | ICD-10-CM | POA: Diagnosis not present

## 2020-03-03 DIAGNOSIS — Z7984 Long term (current) use of oral hypoglycemic drugs: Secondary | ICD-10-CM | POA: Insufficient documentation

## 2020-03-03 DIAGNOSIS — Z20822 Contact with and (suspected) exposure to covid-19: Secondary | ICD-10-CM | POA: Diagnosis not present

## 2020-03-03 DIAGNOSIS — I499 Cardiac arrhythmia, unspecified: Secondary | ICD-10-CM | POA: Diagnosis not present

## 2020-03-03 DIAGNOSIS — Z743 Need for continuous supervision: Secondary | ICD-10-CM | POA: Diagnosis not present

## 2020-03-03 DIAGNOSIS — Z79899 Other long term (current) drug therapy: Secondary | ICD-10-CM | POA: Diagnosis not present

## 2020-03-03 DIAGNOSIS — J439 Emphysema, unspecified: Secondary | ICD-10-CM | POA: Diagnosis not present

## 2020-03-03 DIAGNOSIS — R0602 Shortness of breath: Secondary | ICD-10-CM | POA: Diagnosis not present

## 2020-03-03 DIAGNOSIS — R0789 Other chest pain: Secondary | ICD-10-CM | POA: Diagnosis not present

## 2020-03-03 DIAGNOSIS — J441 Chronic obstructive pulmonary disease with (acute) exacerbation: Secondary | ICD-10-CM

## 2020-03-03 DIAGNOSIS — I110 Hypertensive heart disease with heart failure: Secondary | ICD-10-CM | POA: Diagnosis not present

## 2020-03-03 DIAGNOSIS — R079 Chest pain, unspecified: Secondary | ICD-10-CM | POA: Diagnosis not present

## 2020-03-03 LAB — COMPREHENSIVE METABOLIC PANEL
ALT: 28 U/L (ref 0–44)
AST: 18 U/L (ref 15–41)
Albumin: 3.4 g/dL — ABNORMAL LOW (ref 3.5–5.0)
Alkaline Phosphatase: 38 U/L (ref 38–126)
Anion gap: 11 (ref 5–15)
BUN: 18 mg/dL (ref 6–20)
CO2: 31 mmol/L (ref 22–32)
Calcium: 8.7 mg/dL — ABNORMAL LOW (ref 8.9–10.3)
Chloride: 97 mmol/L — ABNORMAL LOW (ref 98–111)
Creatinine, Ser: 1.26 mg/dL — ABNORMAL HIGH (ref 0.61–1.24)
GFR calc Af Amer: >60 mL/min (ref >60)
GFR calc non Af Amer: >60 mL/min (ref >60)
Glucose, Bld: 114 mg/dL — ABNORMAL HIGH (ref 70–99)
Potassium: 3.7 mmol/L (ref 3.5–5.1)
Sodium: 139 mmol/L (ref 135–145)
Total Bilirubin: 0.7 mg/dL (ref 0.3–1.2)
Total Protein: 5.8 g/dL — ABNORMAL LOW (ref 6.5–8.1)

## 2020-03-03 LAB — CBC WITH DIFFERENTIAL/PLATELET
Abs Immature Granulocytes: 0.03 10*3/uL (ref 0.00–0.07)
Basophils Absolute: 0 10*3/uL (ref 0.0–0.1)
Basophils Relative: 0 %
Eosinophils Absolute: 0.1 10*3/uL (ref 0.0–0.5)
Eosinophils Relative: 1 %
HCT: 36.1 % — ABNORMAL LOW (ref 39.0–52.0)
Hemoglobin: 11.8 g/dL — ABNORMAL LOW (ref 13.0–17.0)
Immature Granulocytes: 1 %
Lymphocytes Relative: 20 %
Lymphs Abs: 1.1 10*3/uL (ref 0.7–4.0)
MCH: 31.1 pg (ref 26.0–34.0)
MCHC: 32.7 g/dL (ref 30.0–36.0)
MCV: 95 fL (ref 80.0–100.0)
Monocytes Absolute: 0.7 10*3/uL (ref 0.1–1.0)
Monocytes Relative: 12 %
Neutro Abs: 3.6 10*3/uL (ref 1.7–7.7)
Neutrophils Relative %: 66 %
Platelets: 251 10*3/uL (ref 150–400)
RBC: 3.8 MIL/uL — ABNORMAL LOW (ref 4.22–5.81)
RDW: 14.2 % (ref 11.5–15.5)
WBC: 5.4 10*3/uL (ref 4.0–10.5)
nRBC: 0 % (ref 0.0–0.2)

## 2020-03-03 LAB — SARS CORONAVIRUS 2 BY RT PCR (HOSPITAL ORDER, PERFORMED IN ~~LOC~~ HOSPITAL LAB): SARS Coronavirus 2: NEGATIVE

## 2020-03-03 LAB — BRAIN NATRIURETIC PEPTIDE: B Natriuretic Peptide: 870.8 pg/mL — ABNORMAL HIGH (ref 0.0–100.0)

## 2020-03-03 LAB — TROPONIN I (HIGH SENSITIVITY): Troponin I (High Sensitivity): 11 ng/L (ref <18)

## 2020-03-03 MED ORDER — IOHEXOL 350 MG/ML SOLN
100.0000 mL | Freq: Once | INTRAVENOUS | Status: AC | PRN
  Administered 2020-03-03: 100 mL via INTRAVENOUS

## 2020-03-03 MED ORDER — NITROGLYCERIN 2 % TD OINT
1.0000 [in_us] | TOPICAL_OINTMENT | Freq: Once | TRANSDERMAL | Status: AC
  Administered 2020-03-03: 1 [in_us] via TOPICAL
  Filled 2020-03-03: qty 1

## 2020-03-03 MED ORDER — IPRATROPIUM-ALBUTEROL 0.5-2.5 (3) MG/3ML IN SOLN
3.0000 mL | Freq: Once | RESPIRATORY_TRACT | Status: AC
  Administered 2020-03-03: 3 mL via RESPIRATORY_TRACT
  Filled 2020-03-03: qty 3

## 2020-03-03 MED ORDER — ALBUTEROL SULFATE (2.5 MG/3ML) 0.083% IN NEBU
2.5000 mg | INHALATION_SOLUTION | Freq: Once | RESPIRATORY_TRACT | Status: AC
  Administered 2020-03-03: 2.5 mg via RESPIRATORY_TRACT
  Filled 2020-03-03: qty 3

## 2020-03-03 MED ORDER — ACETAMINOPHEN 325 MG PO TABS
650.0000 mg | ORAL_TABLET | Freq: Once | ORAL | Status: AC
  Administered 2020-03-03: 650 mg via ORAL
  Filled 2020-03-03: qty 2

## 2020-03-03 MED ORDER — FUROSEMIDE 10 MG/ML IJ SOLN
40.0000 mg | Freq: Once | INTRAMUSCULAR | Status: AC
  Administered 2020-03-03: 40 mg via INTRAVENOUS
  Filled 2020-03-03: qty 4

## 2020-03-03 NOTE — ED Notes (Signed)
Pt transported to CT ?

## 2020-03-03 NOTE — ED Provider Notes (Signed)
[facility] Provider Note   CSN: [id number] Arrival date & time: 03/03/20  0117     History Chief Complaint  Patient presents with  . Shortness of Breath  . Chest Pain    Kenneth [name] is a 59 y.o. male.  Patient presents to the emergency department for evaluation of shortness of breath.  Patient reports chest pain and shortness of breath that has been present for 3 days and has progressively worsening.  Patient did take nitroglycerin at home with only minimal relief.  EMS administered aspirin, albuterol, Atrovent, Solu-Medrol and 3 additional nitroglycerin.  Pain significantly improved with this treatment but he is still short of breath.  Patient reports a history of congestive heart failure.  He reports that his Lasix was stopped when he was admitted to the hospital last week.  He does not know why it was stopped.  He does feel like he is up 3 to 4 pounds over the last few days.  He has slight swelling of his legs and notices worsening shortness of breath when he lies flat.        Past Medical History:  Diagnosis Date  . Arthritis   . Asthma   . Atrial fibrillation ([place])   . CHF (congestive heart failure) ([place])   . Depression   . Diabetes mellitus without complication ([place])   . Emphysema of lung ([place])   . Heart murmur   . Hyperlipidemia   . Hypertension     Patient Active Problem List   Diagnosis Date Noted  . Acute on chronic respiratory failure with hypoxemia ([place]) 02/08/2020  . Chronic constipation 07/19/2019  . Moderate persistent asthma with acute exacerbation 06/17/2019  . Hospital discharge follow-up 06/17/2019  . Shortness of breath 06/12/2019  . Chest tightness 06/12/2019  . Snoring 03/29/2019  . Insomnia due to other mental disorder 11/15/2018  . Anticoagulant long-term use 10/21/2018  . Gastroesophageal reflux disease without esophagitis 08/10/2018  . Tobacco abuse 08/10/2018  . Chest pain 07/27/2018  . PAF  (paroxysmal atrial fibrillation) ([place]) 07/27/2018  . Mild renal insufficiency 07/27/2018  . Chronic combined systolic and diastolic heart failure ([place]) 07/27/2018  . Slow transit constipation 07/20/2018  . Essential hypertension 07/06/2018  . Chronic obstructive pulmonary disease ([place]) 07/06/2018  . Controlled type 2 diabetes mellitus without complication, without long-term current use of insulin ([place]) 07/06/2018  . Depression with anxiety 07/06/2018  . Anemia 07/06/2018  . Healthcare maintenance 07/06/2018  . Alcohol abuse 07/06/2018    No past surgical history on file.     Family History  Problem Relation Age of Onset  . Diabetes Sister   . Coronary artery disease Brother     Social History   Tobacco Use  . Smoking status: Current Some Day Smoker    Packs/day: 0.25    Types: Cigarettes  . Smokeless tobacco: Never Used  Substance Use Topics  . Alcohol use: Yes    Comment: Drinks up to six beers around a game  . Drug use: Yes    Types: Marijuana    Comment: occ marijuana    Home Medications Prior to Admission medications   Medication Sig Start Date End Date Taking? Authorizing Provider  albuterol (PROVENTIL HFA;VENTOLIN HFA) 108 (90 Base) MCG/ACT inhaler Inhale 1-2 puffs into the lungs every 4 (four) hours as needed for wheezing or shortness of breath.  06/19/16   [provider]  albuterol (PROVENTIL) (2.5 MG/3ML) 0.083% nebulizer solution Take 3 mLs (2.5 mg total)  by nebulization 5 (five) times daily as needed for wheezing or shortness of breath. 02/24/20   [name], MD  amLODipine (NORVASC) 10 MG tablet Take 1 tablet (10 mg total) by mouth daily. 07/19/19   Luetta Nutting, DO  apixaban (ELIQUIS) 5 MG TABS tablet Take 1 tablet (5 mg total) by mouth 2 (two) times daily. 11/08/19   Buford Dresser, MD  aspirin EC 81 MG tablet Take 81 mg by mouth daily.    [provider]  Blood Glucose Monitoring Suppl (ONE TOUCH ULTRA 2) w/Device KIT  Use to test blood sugars 1-2 times daily. 05/20/19   [name], MD  busPIRone (BUSPAR) 15 MG tablet Take 1 tablet (15 mg total) by mouth 2 (two) times daily. 09/06/19   [name], MD  carvedilol (COREG) 12.5 MG tablet Take 1 tablet (12.5 mg total) by mouth 2 (two) times daily with a meal. 02/11/20   Matcha, Anupama, MD  famotidine (PEPCID) 20 MG tablet TAKE 1 TABLET BY MOUTH  DAILY Patient taking differently: Take 20 mg by mouth daily.  05/04/19   [name], MD  FLUoxetine (PROZAC) 20 MG capsule TAKE 3 CAPSULES BY MOUTH  DAILY Patient taking differently: Take 60 mg by mouth daily.  05/26/19   [name], MD  Fluticasone-Salmeterol (ADVAIR DISKUS) 250-50 MCG/DOSE AEPB Inhale 1 puff into the lungs 2 (two) times daily. 07/05/19   [name], MD  glucose blood [facility] ULTRA) test strip Use to test blood sugars 1-2 times daily. 05/20/19   [name], MD  ipratropium (ATROVENT) 0.02 % nebulizer solution Take 2.5 mLs (0.5 mg total) by nebulization 4 (four) times daily. 08/05/19   Law, Bea Graff, PA-C  isosorbide mononitrate (IMDUR) 30 MG 24 hr tablet Take 1 tablet (30 mg total) by mouth daily. 02/11/20   Matcha, Beverely Pace, MD  Lancets (ONETOUCH ULTRASOFT) lancets Use to test blood sugars 1-2 times daily. 05/20/19   [name], MD  losartan (COZAAR) 25 MG tablet Take 1 tablet (25 mg total) by mouth daily. Please schedule annual appt with Dr. Harrell Gave for refills. [phone]. 3rd attempt. Patient taking differently: Take 25 mg by mouth daily.  01/31/20   Buford Dresser, MD  metFORMIN (GLUCOPHAGE) 500 MG tablet TAKE 1 TABLET BY MOUTH TWO  TIMES DAILY WITH MEALS Patient taking differently: Take 500 mg by mouth 2 (two) times daily with a meal.  05/26/19   [name], MD  Multiple Vitamin (MULTIVITAMIN WITH MINERALS) TABS tablet Take 1 tablet by mouth daily.    [provider]  nitroGLYCERIN  (NITROSTAT) 0.4 MG SL tablet DISSOLVE 1 TABLET UNDER THE TONGUE EVERY 5 MINUTES AS  NEEDED FOR CHEST PAIN. MAX  OF 3 TABLETS IN 15 MINUTES. CALL 911 IF PAIN PERSISTS. Patient taking differently: Place 0.4 mg under the tongue every 5 (five) minutes as needed for chest pain.  06/29/19   [name], MD  predniSONE (STERAPRED UNI-PAK 21 TAB) 10 MG (21) TBPK tablet Take 6 tabs by mouth for 2 days, then 5 tabs for 2 days, then 4 tabs for 2 days, then 3 tabs for 2 days, 2 tabs for 2 days, then 1 tabfor 2 days 02/24/20   [name], MD  traZODone (DESYREL) 50 MG tablet TAKE 1/2 TO 1 TABLET BY  MOUTH AT BEDTIME AS NEEDED  FOR SLEEP. Patient taking differently: Take 50 mg by mouth at bedtime as needed for sleep.  05/26/19   Abelino Derrick  Delrae Alfred, MD    Allergies    Lisinopril, Other, and Tomato  Review of Systems   Review of Systems  Respiratory: Positive for shortness of breath.   Cardiovascular: Positive for chest pain.  All other systems reviewed and are negative.   Physical Exam Updated Vital Signs BP (!) 173/84   Pulse 67   Temp 97.6 F (36.4 C) (Oral)   Resp (!) 21   Ht 6' (1.829 m)   Wt 78.9 kg   SpO2 97%   BMI 23.60 kg/m   Physical Exam Vitals and nursing note reviewed.  Constitutional:      General: He is not in acute distress.    Appearance: Normal appearance. He is well-developed.  HENT:     Head: Normocephalic and atraumatic.     Right Ear: Hearing normal.     Left Ear: Hearing normal.     Nose: Nose normal.  Eyes:     Conjunctiva/sclera: Conjunctivae normal.     Pupils: Pupils are equal, round, and reactive to light.  Cardiovascular:     Rate and Rhythm: Regular rhythm.     Heart sounds: S1 normal and S2 normal. No murmur heard.  No friction rub. No gallop.   Pulmonary:     Effort: Pulmonary effort is normal. No respiratory distress.     Breath sounds: Decreased breath sounds, wheezing, rhonchi and rales present.  Chest:     Chest wall: No  tenderness.  Abdominal:     General: Bowel sounds are normal.     Palpations: Abdomen is soft.     Tenderness: There is no abdominal tenderness. There is no guarding or rebound. Negative signs include Murphy's sign and McBurney's sign.     Hernia: No hernia is present.  Musculoskeletal:        General: Normal range of motion.     Cervical back: Normal range of motion and neck supple.     Right lower leg: 1+ Pitting Edema present.     Left lower leg: 1+ Pitting Edema present.  Skin:    General: Skin is warm and dry.     Findings: No rash.  Neurological:     Mental Status: He is alert and oriented to person, place, and time.     GCS: GCS eye subscore is 4. GCS verbal subscore is 5. GCS motor subscore is 6.     Cranial Nerves: No cranial nerve deficit.     Sensory: No sensory deficit.     Coordination: Coordination normal.  Psychiatric:        Speech: Speech normal.        Behavior: Behavior normal.        Thought Content: Thought content normal.     ED Results / Procedures / Treatments   Labs (all labs ordered are listed, but only abnormal results are displayed) Labs Reviewed  COMPREHENSIVE METABOLIC PANEL - Abnormal; Notable for the following components:      Result Value   Chloride 97 (*)    Glucose, Bld 114 (*)    Creatinine, Ser 1.26 (*)    Calcium 8.7 (*)    Total Protein 5.8 (*)    Albumin 3.4 (*)    All other components within normal limits  CBC WITH DIFFERENTIAL/PLATELET - Abnormal; Notable for the following components:   RBC 3.80 (*)    Hemoglobin 11.8 (*)    HCT 36.1 (*)    All other components within normal limits  BRAIN NATRIURETIC PEPTIDE - Abnormal; Notable  for the following components:   B Natriuretic Peptide 870.8 (*)    All other components within normal limits  SARS CORONAVIRUS 2 BY RT PCR (HOSPITAL ORDER, [place]  LAB)  TROPONIN I (HIGH SENSITIVITY)  TROPONIN I (HIGH SENSITIVITY)    EKG None  Radiology CT ANGIO CHEST  PE W OR WO CONTRAST  Result Date: 03/03/2020 CLINICAL DATA:  PE suspected EXAM: CT ANGIOGRAPHY CHEST WITH CONTRAST TECHNIQUE: Multidetector CT imaging of the chest was performed using the standard protocol during bolus administration of intravenous contrast. Multiplanar CT image reconstructions and MIPs were obtained to evaluate the vascular anatomy. CONTRAST:  155m OMNIPAQUE IOHEXOL 350 MG/ML SOLN COMPARISON:  None. FINDINGS: Cardiovascular: No filling defects in the pulmonary arteries to suggest pulmonary emboli. Scattered aortic and coronary artery calcifications. Heart is normal size. Aorta is normal caliber. Mediastinum/Nodes: No mediastinal, hilar, or axillary adenopathy. Trachea and esophagus are unremarkable. Thyroid unremarkable. Lungs/Pleura: Mild centrilobular and paraseptal emphysema. No confluent opacities or effusions. Upper Abdomen: Imaging into the upper abdomen shows no acute findings. Musculoskeletal: Chest wall soft tissues are unremarkable. No acute bony abnormality. Review of the MIP images confirms the above findings. IMPRESSION: No evidence of pulmonary embolus. Coronary artery disease. Aortic Atherosclerosis (ICD10-I70.0) and Emphysema (ICD10-J43.9). Electronically Signed   By: KRolm BaptiseM.D.   On: 03/03/2020 03:56   DG Chest Port 1 View  Result Date: 03/03/2020 CLINICAL DATA:  Shortness of breath EXAM: PORTABLE CHEST 1 VIEW COMPARISON:  02/08/2020 FINDINGS: The heart size and mediastinal contours are within normal limits. Both lungs are clear. The visualized skeletal structures are unremarkable. IMPRESSION: No active disease. Electronically Signed   By: KUlyses JarredM.D.   On: 03/03/2020 01:40    Procedures Procedures (including critical care time)  Medications Ordered in ED Medications  nitroGLYCERIN (NITROGLYN) 2 % ointment 1 inch (1 inch Topical Given 03/03/20 0226)  ipratropium-albuterol (DUONEB) 0.5-2.5 (3) MG/3ML nebulizer solution 3 mL (3 mLs Nebulization Given  03/03/20 0324)  albuterol (PROVENTIL) (2.5 MG/3ML) 0.083% nebulizer solution 2.5 mg (2.5 mg Nebulization Given 03/03/20 0324)  iohexol (OMNIPAQUE) 350 MG/ML injection 100 mL (100 mLs Intravenous Contrast Given 03/03/20 0352)    ED Course  I have reviewed the triage vital signs and the nursing notes.  Pertinent labs & imaging results that were available during my care of the patient were reviewed by me and considered in my medical decision making (see chart for details).    MDM Rules/Calculators/A&P                          Patient presents to the emergency department for evaluation of shortness of breath.  Patient was admitted to the hospital nearly 3 weeks ago for respiratory distress.  At that time he was treated for COPD with improvement and discharged.  He did have an elevated creatinine noted during hospitalization and therefore Lasix was discharged.  Reviewing his records reveals that his last echo was in September of last year.  At that time his ejection fraction was 30 to 35%.  This is likely a mixed picture.  He likely has some element of bronchospasm but suspect there is some heart failure as well.  He did come in in moderate respiratory distress but this has significantly improved.  Patient treated with nitroglycerin for his elevated blood pressure and to decrease preload for presumed congestive heart failure.  He was given bronchodilator therapy and this has helped him tremendously.  This  likely parallels his recent admission.  He has, however, off of his Lasix now and therefore will initiate diuresis.  40 mg of Lasix given here in the ED IV.  He is looking much better, room air oxygen saturation 97 to 99% without any distress.  He will therefore be able to be discharged home, restart his Lasix and follow-up with cardiology in the office.  Final Clinical Impression(s) / ED Diagnoses Final diagnoses:  COPD exacerbation ([place])  Acute on chronic congestive heart failure, unspecified heart  failure type [facility])    Rx / DC Orders ED Discharge Orders    None       Hillary Struss, Gwenyth Allegra, MD 03/03/20 [phone]

## 2020-03-03 NOTE — Discharge Instructions (Addendum)
Please restart your Lasix (furosemide) at the same dose that you were taking before you were hospitalized.  Schedule follow-up with cardiology to recheck your fluid status.

## 2020-03-03 NOTE — ED Triage Notes (Signed)
Pt arrives via [facility] from home   Pt called EMS for SOB/ CPx 3 days 6/10. Hx of CHF.  Pt had 2 nitro at home with "a little bit of relief, not much".  Given 324 aspirin, 10 albuterol, .5 atrovent, 125 solumedrol, 3 additional nitro. Pain down to 1/10.   Pt on eliquis  Pt has been off his Lasix for the past 2 wks  A&ox4.   Pt states he "feels like he's drowning" when he is laying down.

## 2020-03-05 ENCOUNTER — Telehealth: Payer: Self-pay | Admitting: Family Medicine

## 2020-03-05 ENCOUNTER — Other Ambulatory Visit: Payer: Self-pay | Admitting: *Deleted

## 2020-03-05 NOTE — Patient Outreach (Signed)
[facility]) Care Management  03/05/2020  Kenneth [name] 12-24-1960 [id number]   Outreach attempt #2, unsuccessful.    Call placed to member to follow up on management of COPD and to complete initial assessment, no answer, unable to leave message.  Will send unsuccessful outreach letter and follow up within the next 3-4 business days.  [name], [place], MSN [facility] Care Management  [facility] Manager [phone]

## 2020-03-05 NOTE — Telephone Encounter (Signed)
Pts pharmacy calling to ask about refill of ipratropium (ATROVENT) 0.02 % nebulizer solution, They said they will fax over a refill request for it again. I told them I would send back a message as well. Please advise

## 2020-03-06 ENCOUNTER — Other Ambulatory Visit: Payer: Self-pay

## 2020-03-06 MED ORDER — ALBUTEROL SULFATE (2.5 MG/3ML) 0.083% IN NEBU: 2.5000 mg | INHALATION_SOLUTION | Freq: Every day | RESPIRATORY_TRACT | 3 refills | Status: DC | PRN

## 2020-03-06 NOTE — Telephone Encounter (Signed)
Resent to pharmacy/thx dmf 

## 2020-03-07 DIAGNOSIS — G4733 Obstructive sleep apnea (adult) (pediatric): Secondary | ICD-10-CM | POA: Diagnosis not present

## 2020-03-07 NOTE — Telephone Encounter (Signed)
Patient called and wanted to speak to someone regarding medication. CB is [phone]

## 2020-03-08 ENCOUNTER — Emergency Department (HOSPITAL_COMMUNITY)
Admission: EM | Admit: 2020-03-08 | Discharge: 2020-03-08 | Disposition: A | Discharge status: Home / Self Care | Payer: Medicare Other | Attending: Emergency Medicine | Admitting: Emergency Medicine

## 2020-03-08 ENCOUNTER — Emergency Department (HOSPITAL_COMMUNITY): Payer: Medicare Other

## 2020-03-08 ENCOUNTER — Other Ambulatory Visit: Payer: Self-pay

## 2020-03-08 DIAGNOSIS — Z79899 Other long term (current) drug therapy: Secondary | ICD-10-CM | POA: Diagnosis not present

## 2020-03-08 DIAGNOSIS — I110 Hypertensive heart disease with heart failure: Secondary | ICD-10-CM | POA: Diagnosis not present

## 2020-03-08 DIAGNOSIS — J9601 Acute respiratory failure with hypoxia: Secondary | ICD-10-CM

## 2020-03-08 DIAGNOSIS — R0603 Acute respiratory distress: Secondary | ICD-10-CM | POA: Diagnosis not present

## 2020-03-08 DIAGNOSIS — Z7984 Long term (current) use of oral hypoglycemic drugs: Secondary | ICD-10-CM | POA: Insufficient documentation

## 2020-03-08 DIAGNOSIS — E119 Type 2 diabetes mellitus without complications: Secondary | ICD-10-CM | POA: Diagnosis not present

## 2020-03-08 DIAGNOSIS — I5042 Chronic combined systolic (congestive) and diastolic (congestive) heart failure: Secondary | ICD-10-CM | POA: Insufficient documentation

## 2020-03-08 DIAGNOSIS — J441 Chronic obstructive pulmonary disease with (acute) exacerbation: Secondary | ICD-10-CM

## 2020-03-08 DIAGNOSIS — Z7901 Long term (current) use of anticoagulants: Secondary | ICD-10-CM | POA: Insufficient documentation

## 2020-03-08 DIAGNOSIS — I480 Paroxysmal atrial fibrillation: Secondary | ICD-10-CM | POA: Insufficient documentation

## 2020-03-08 DIAGNOSIS — Z20822 Contact with and (suspected) exposure to covid-19: Secondary | ICD-10-CM | POA: Insufficient documentation

## 2020-03-08 DIAGNOSIS — R0602 Shortness of breath: Secondary | ICD-10-CM | POA: Diagnosis not present

## 2020-03-08 LAB — CBC
HCT: 37.9 % — ABNORMAL LOW (ref 39.0–52.0)
Hemoglobin: 12.1 g/dL — ABNORMAL LOW (ref 13.0–17.0)
MCH: 30.9 pg (ref 26.0–34.0)
MCHC: 31.9 g/dL (ref 30.0–36.0)
MCV: 96.9 fL (ref 80.0–100.0)
Platelets: 256 10*3/uL (ref 150–400)
RBC: 3.91 MIL/uL — ABNORMAL LOW (ref 4.22–5.81)
RDW: 14.4 % (ref 11.5–15.5)
WBC: 6.8 10*3/uL (ref 4.0–10.5)
nRBC: 0 % (ref 0.0–0.2)

## 2020-03-08 LAB — COMPREHENSIVE METABOLIC PANEL
ALT: 65 U/L — ABNORMAL HIGH (ref 0–44)
AST: 54 U/L — ABNORMAL HIGH (ref 15–41)
Albumin: 3.5 g/dL (ref 3.5–5.0)
Alkaline Phosphatase: 39 U/L (ref 38–126)
Anion gap: 12 (ref 5–15)
BUN: 11 mg/dL (ref 6–20)
CO2: 31 mmol/L (ref 22–32)
Calcium: 9.1 mg/dL (ref 8.9–10.3)
Chloride: 95 mmol/L — ABNORMAL LOW (ref 98–111)
Creatinine, Ser: 1.22 mg/dL (ref 0.61–1.24)
GFR calc Af Amer: >60 mL/min (ref >60)
GFR calc non Af Amer: >60 mL/min (ref >60)
Glucose, Bld: 145 mg/dL — ABNORMAL HIGH (ref 70–99)
Potassium: 4.6 mmol/L (ref 3.5–5.1)
Sodium: 138 mmol/L (ref 135–145)
Total Bilirubin: 0.5 mg/dL (ref 0.3–1.2)
Total Protein: 5.7 g/dL — ABNORMAL LOW (ref 6.5–8.1)

## 2020-03-08 LAB — PROTIME-INR
INR: 1.1 (ref 0.8–1.2)
Prothrombin Time: 13.6 seconds (ref 11.4–15.2)

## 2020-03-08 LAB — BRAIN NATRIURETIC PEPTIDE: B Natriuretic Peptide: 716.9 pg/mL — ABNORMAL HIGH (ref 0.0–100.0)

## 2020-03-08 LAB — SARS CORONAVIRUS 2 BY RT PCR (HOSPITAL ORDER, PERFORMED IN ~~LOC~~ HOSPITAL LAB): SARS Coronavirus 2: NEGATIVE

## 2020-03-08 MED ORDER — PREDNISONE 50 MG PO TABS
50.0000 mg | ORAL_TABLET | Freq: Every day | ORAL | 0 refills | Status: AC
Start: 2020-03-09 — End: 2020-03-13

## 2020-03-08 MED ORDER — SODIUM CHLORIDE 0.9% FLUSH: 3.0000 mL | Freq: Once | INTRAVENOUS | Status: DC

## 2020-03-08 MED ORDER — ALBUTEROL (5 MG/ML) CONTINUOUS INHALATION SOLN
INHALATION_SOLUTION | RESPIRATORY_TRACT | Status: AC
  Administered 2020-03-08: 10 mg/h via RESPIRATORY_TRACT
  Filled 2020-03-08: qty 20

## 2020-03-08 MED ORDER — ALBUTEROL (5 MG/ML) CONTINUOUS INHALATION SOLN: 10.0000 mg/h | INHALATION_SOLUTION | RESPIRATORY_TRACT | Status: DC

## 2020-03-08 MED ORDER — IPRATROPIUM BROMIDE 0.02 % IN SOLN
0.5000 mg | Freq: Once | RESPIRATORY_TRACT | Status: AC
  Administered 2020-03-08: 0.5 mg via RESPIRATORY_TRACT

## 2020-03-08 NOTE — ED Provider Notes (Signed)
3:43 PM Care assumed from Dr. Clarice Pole.  At time of transfer care, patient is awaiting diagnostic work-up results including a BNP and Covid test.  Patient is currently on BiPAP with a continuous albuterol treatment.  He already received duo nebs, Solu-Medrol, and magnesium for the wheezing and shortness of breath.  Due to his lack of oxygen use at home and the respiratory distress he presented with and his oxygen needs at this time, anticipate admission.  5:58 PM Patient's BMP returned improved prior.  Other labs similar to prior.  On reassessment, he has no wheezing and is feeling much better.  He is off of oxygen and is in no respiratory distress.  He does not want to be admitted and would like to go home.  Patient be given a burst of steroids given the likely COPD exacerbation and will follow up with his PCP in the next several days.  He again does not want to come to the hospital and is not on oxygen.  We will discharge him with close follow-up instructions and good return precautions.  He has no other questions or concerns and was discharged in good condition with improved symptoms.    Clinical Impression: 1. COPD exacerbation (HCC)   2. Acute respiratory failure with hypoxia (H[facility])     Disposition: Discharge  Condition: Good  I have discussed the results, Dx and Tx plan with the pt(& family if present). He/she/they expressed understanding and agree(s) with the plan. Discharge instructions discussed at great length. Strict return precautions discussed and pt &/or family have verbalized understanding of the instructions. No further questions at time of discharge.    New Prescriptions   PREDNISONE (DELTASONE) 50 MG TABLET    Take 1 tablet (50 mg total) by mouth daily for 4 days.    Follow Up: Mliss Sax, MD [address] [place] [address] [phone]     [facility] EMERGENCY DEPARTMENT [address] [id number] mc [place] [address] [phone]         Brysun Eschmann, Canary Brim, MD 03/08/20 [phone]

## 2020-03-08 NOTE — ED Notes (Signed)
Pt removed from bipap by RT recommendations over phone. Placed pt on 4L [place] after removal. Pt tolerating well and seems calm with no labored breathing

## 2020-03-08 NOTE — ED Provider Notes (Signed)
[facility] EMERGENCY DEPARTMENT Provider Note   CSN: [id number] Arrival date & time: 03/08/20  1423     History Chief Complaint  Patient presents with  . Respiratory Distress    Kenneth [name] is a 59 y.o. male.  HPI Patient does have history of COPD and congestive heart failure.  He reports he got up this morning and was feeling short of breath.  He tried using his home nebulizer machines but was not getting any improvement.  His symptoms quickly started to get worse and his breathing felt very tight.  He called EMS.  He denies he was having chest pain.  He denies he had a recent fevers chills.  He reports he has had some cough.  Patient has had Covid vaccine is up-to-date.  He reports he got an episode a few weeks ago of coughing and shortness of breath but was not this severe.  EMS administered 2 g of magnesium, Solu-Medrol and 2 DuoNeb's.  Patient reports he has made some improvement.  Patient does have a ongoing smoking history.    Past Medical History:  Diagnosis Date  . Arthritis   . Asthma   . Atrial fibrillation ([place])   . CHF (congestive heart failure) ([place])   . Depression   . Diabetes mellitus without complication ([place])   . Emphysema of lung ([place])   . Heart murmur   . Hyperlipidemia   . Hypertension     Patient Active Problem List   Diagnosis Date Noted  . Acute on chronic respiratory failure with hypoxemia ([place]) 02/08/2020  . Chronic constipation 07/19/2019  . Moderate persistent asthma with acute exacerbation 06/17/2019  . Hospital discharge follow-up 06/17/2019  . Shortness of breath 06/12/2019  . Chest tightness 06/12/2019  . Snoring 03/29/2019  . Insomnia due to other mental disorder 11/15/2018  . Anticoagulant long-term use 10/21/2018  . Gastroesophageal reflux disease without esophagitis 08/10/2018  . Tobacco abuse 08/10/2018  . Chest pain 07/27/2018  . PAF (paroxysmal atrial fibrillation) ([place]) 07/27/2018  . Mild renal insufficiency  07/27/2018  . Chronic combined systolic and diastolic heart failure ([place]) 07/27/2018  . Slow transit constipation 07/20/2018  . Essential hypertension 07/06/2018  . Chronic obstructive pulmonary disease ([place]) 07/06/2018  . Controlled type 2 diabetes mellitus without complication, without long-term current use of insulin ([place]) 07/06/2018  . Depression with anxiety 07/06/2018  . Anemia 07/06/2018  . Healthcare maintenance 07/06/2018  . Alcohol abuse 07/06/2018    No past surgical history on file.     Family History  Problem Relation Age of Onset  . Diabetes Sister   . Coronary artery disease Brother     Social History   Tobacco Use  . Smoking status: Current Some Day Smoker    Packs/day: 0.25    Types: Cigarettes  . Smokeless tobacco: Never Used  Substance Use Topics  . Alcohol use: Yes    Comment: Drinks up to six beers around a game  . Drug use: Yes    Types: Marijuana    Comment: occ marijuana    Home Medications Prior to Admission medications   Medication Sig Start Date End Date Taking? Authorizing Provider  albuterol (PROVENTIL HFA;VENTOLIN HFA) 108 (90 Base) MCG/ACT inhaler Inhale 1-2 puffs into the lungs every 4 (four) hours as needed for wheezing or shortness of breath.  06/19/16  Yes [provider]  albuterol (PROVENTIL) (2.5 MG/3ML) 0.083% nebulizer solution Take 3 mLs (2.5 mg total) by nebulization 5 (five) times daily as needed  for wheezing or shortness of breath. 03/06/20  Yes [name], MD  amLODipine (NORVASC) 10 MG tablet Take 1 tablet (10 mg total) by mouth daily. 07/19/19  Yes Luetta Nutting, DO  apixaban (ELIQUIS) 5 MG TABS tablet Take 1 tablet (5 mg total) by mouth 2 (two) times daily. 11/08/19  Yes Buford Dresser, MD  aspirin EC 81 MG tablet Take 81 mg by mouth daily.   Yes [provider]  Blood Glucose Monitoring Suppl (ONE TOUCH ULTRA 2) w/Device KIT Use to test blood sugars 1-2 times daily. 05/20/19  Yes [name], MD  busPIRone (BUSPAR) 15 MG tablet Take 1 tablet (15 mg total) by mouth 2 (two) times daily. 09/06/19  Yes [name], MD  carvedilol (COREG) 12.5 MG tablet Take 1 tablet (12.5 mg total) by mouth 2 (two) times daily with a meal. 02/11/20  Yes Matcha, Anupama, MD  famotidine (PEPCID) 20 MG tablet TAKE 1 TABLET BY MOUTH  DAILY Patient taking differently: Take 20 mg by mouth daily.  05/04/19  Yes [name], MD  FLUoxetine (PROZAC) 20 MG capsule TAKE 3 CAPSULES BY MOUTH  DAILY Patient taking differently: Take 60 mg by mouth daily.  05/26/19  Yes [name], MD  Fluticasone-Salmeterol (ADVAIR DISKUS) 250-50 MCG/DOSE AEPB Inhale 1 puff into the lungs 2 (two) times daily. 07/05/19  Yes [name], MD  glucose blood [facility] ULTRA) test strip Use to test blood sugars 1-2 times daily. 05/20/19  Yes [name], MD  ipratropium (ATROVENT) 0.02 % nebulizer solution Take 2.5 mLs (0.5 mg total) by nebulization 4 (four) times daily. 08/05/19  Yes Law, Bea Graff, PA-C  isosorbide mononitrate (IMDUR) 30 MG 24 hr tablet Take 1 tablet (30 mg total) by mouth daily. 02/11/20  Yes Matcha, Anupama, MD  Lancets (ONETOUCH ULTRASOFT) lancets Use to test blood sugars 1-2 times daily. 05/20/19  Yes [name], MD  losartan (COZAAR) 25 MG tablet Take 1 tablet (25 mg total) by mouth daily. Please schedule annual appt with Dr. Harrell Gave for refills. [phone]. 3rd attempt. Patient taking differently: Take 25 mg by mouth daily.  01/31/20  Yes Buford Dresser, MD  metFORMIN (GLUCOPHAGE) 500 MG tablet TAKE 1 TABLET BY MOUTH TWO  TIMES DAILY WITH MEALS Patient taking differently: Take 500 mg by mouth 2 (two) times daily with a meal.  05/26/19  Yes [name], MD  Multiple Vitamin (MULTIVITAMIN WITH MINERALS) TABS tablet Take 1 tablet by mouth daily.   Yes [provider]  nitroGLYCERIN (NITROSTAT) 0.4 MG SL tablet  DISSOLVE 1 TABLET UNDER THE TONGUE EVERY 5 MINUTES AS  NEEDED FOR CHEST PAIN. MAX  OF 3 TABLETS IN 15 MINUTES. CALL 911 IF PAIN PERSISTS. Patient taking differently: Place 0.4 mg under the tongue every 5 (five) minutes as needed for chest pain.  06/29/19  Yes [name], MD  traZODone (DESYREL) 50 MG tablet TAKE 1/2 TO 1 TABLET BY  MOUTH AT BEDTIME AS NEEDED  FOR SLEEP. Patient taking differently: Take 50 mg by mouth at bedtime as needed for sleep.  05/26/19  Yes [name], MD  predniSONE (STERAPRED UNI-PAK 21 TAB) 10 MG (21) TBPK tablet Take 6 tabs by mouth for 2 days, then 5 tabs for 2 days, then 4 tabs for 2 days, then 3 tabs for 2 days, 2 tabs for 2 days, then 1 tabfor 2 days Patient not taking: Reported on 03/08/2020 02/24/20   [name], MD  Allergies    Lisinopril, Other, and Tomato  Review of Systems   Review of Systems 10 systems reviewed and negative except as per HPI Physical Exam Updated Vital Signs BP (!) 167/94   Pulse 70   Resp 18   Ht 6' (1.829 m)   Wt 78.9 kg   SpO2 100%   BMI 23.60 kg/m   Physical Exam Constitutional:      Comments: Increased work of breathing.  Mental status clear.  Nontoxic.  HENT:     Head: Normocephalic and atraumatic.     Mouth/Throat:     Pharynx: Oropharynx is clear.  Eyes:     Extraocular Movements: Extraocular movements intact.  Cardiovascular:     Comments: Tachycardia. Pulmonary:     Comments: Tachypnea.  Speaking in short full sentences.  Breath sounds are very soft from bases to midlung fields.  Some fine expiratory wheeze at mid upper lung fields. Abdominal:     General: There is no distension.     Palpations: Abdomen is soft.     Tenderness: There is no abdominal tenderness.  Musculoskeletal:        General: No swelling or tenderness. Normal range of motion.     Cervical back: Neck supple.     Right lower leg: No edema.     Left lower leg: No edema.  Skin:    General: Skin is warm and  dry.  Neurological:     General: No focal deficit present.     Mental Status: He is oriented to person, place, and time.     Coordination: Coordination normal.  Psychiatric:        Mood and Affect: Mood normal.     ED Results / Procedures / Treatments   Labs (all labs ordered are listed, but only abnormal results are displayed) Labs Reviewed  CBC - Abnormal; Notable for the following components:      Result Value   RBC 3.91 (*)    Hemoglobin 12.1 (*)    HCT 37.9 (*)    All other components within normal limits  BRAIN NATRIURETIC PEPTIDE - Abnormal; Notable for the following components:   B Natriuretic Peptide 716.9 (*)    All other components within normal limits  COMPREHENSIVE METABOLIC PANEL - Abnormal; Notable for the following components:   Chloride 95 (*)    Glucose, Bld 145 (*)    Total Protein 5.7 (*)    AST 54 (*)    ALT 65 (*)    All other components within normal limits  SARS CORONAVIRUS 2 BY RT PCR [facility] ORDER, [place] [facility])  PROTIME-INR    EKG EKG Interpretation  Date/Time:  Thursday March 08 2020 15:21:08 EDT Ventricular Rate:  65 PR Interval:    QRS Duration: 83 QT Interval:  451 QTC Calculation: 477 R Axis:   63 Text Interpretation: Sinus rhythm extensive artifact, no obvious acute changes. need repeat. Confirmed by [name] [phone]) on 03/08/2020 3:32:54 PM   Radiology DG Chest Portable 1 View  Result Date: 03/08/2020 CLINICAL DATA:  Respiratory distress EXAM: PORTABLE CHEST 1 VIEW COMPARISON:  03/03/2020 FINDINGS: The heart size and mediastinal contours are within normal limits. Both lungs are clear. The visualized skeletal structures are unremarkable. IMPRESSION: No active disease. Electronically Signed   By: Inez Catalina M.D.   On: 03/08/2020 14:57    Procedures Procedures (including critical care time) CRITICAL CARE Performed by: [name]   Total critical care time: 30 minutes  Critical care time  was exclusive of separately billable procedures and treating other patients.  Critical care was necessary to treat or prevent imminent or life-threatening deterioration.  Critical care was time spent personally by me on the following activities: development of treatment plan with patient and/or surrogate as well as nursing, discussions with consultants, evaluation of patient's response to treatment, examination of patient, obtaining history from patient or surrogate, ordering and performing treatments and interventions, ordering and review of laboratory studies, ordering and review of radiographic studies, pulse oximetry and re-evaluation of patient's condition. Medications Ordered in ED Medications  sodium chloride flush (NS) 0.9 % injection 3 mL (has no administration in time range)  albuterol (PROVENTIL,VENTOLIN) solution continuous neb (10 mg/hr Nebulization New Bag/Given 03/08/20 1446)  ipratropium (ATROVENT) nebulizer solution 0.5 mg (0.5 mg Nebulization Given 03/08/20 1446)    ED Course  I have reviewed the triage vital signs and the nursing notes.  Pertinent labs & imaging results that were available during my care of the patient were reviewed by me and considered in my medical decision making (see chart for details).    MDM Rules/Calculators/A&P                          Patient presents with shortness of breath.  Symptoms are suggestive of a COPD exacerbation.  He does not have associated chest pain or fever.  He has had some cough.  Patient is up-to-date on immunizations for Covid.  After initial treatment by EMS patient was showing some slight improvement.  He however had very soft breath sounds and significant tachypnea.  Patient was placed on BiPAP with continuous albuterol Atrovent treatments.  Patient does appear to be showing improvement.  He is not lethargic or somnolent.  Likely admission given severity of COPD exacerbation.  Dr. Sherry Ruffing to monitor and review outstanding labs for  final disposition. Final Clinical Impression(s) / ED Diagnoses Final diagnoses:  COPD exacerbation ([place])  Acute respiratory failure with hypoxia [facility])    Rx / DC Orders ED Discharge Orders    None       [name], MD 03/08/20 1614

## 2020-03-08 NOTE — Discharge Instructions (Signed)
Your history and exam today was consistent with a COPD exacerbation that improved with all the medications EMS and we gave you.  As you are now off of oxygen and your work-up appeared similar or improved from prior, we do feel that you are safe to go home given your improved symptoms.  We offered admission but you would like to try going home.  Please use the steroids for the next 4 days starting tomorrow and use your inhalers.  Please rest and follow-up with your primary doctor in the next 24 to 48 hours.  If any symptoms change or worsen acutely, please return to the nearest emergency department immediately.

## 2020-03-08 NOTE — Telephone Encounter (Signed)
Spoke with pharmacy who states that patient picked up medication on 03/03/20 then called patient who states that he has meds at home he just need for them to be delivered next time he needs a refill.

## 2020-03-08 NOTE — Telephone Encounter (Signed)
Patient is calling back to check the status of his refill. Please give him a call back at [phone] and advise.  Thank you, Antony Blackbird

## 2020-03-09 ENCOUNTER — Other Ambulatory Visit: Payer: Self-pay

## 2020-03-09 ENCOUNTER — Telehealth: Payer: Self-pay | Admitting: Family Medicine

## 2020-03-09 ENCOUNTER — Other Ambulatory Visit: Payer: Self-pay | Admitting: *Deleted

## 2020-03-09 MED ORDER — ISOSORBIDE MONONITRATE ER 30 MG PO TB24: 30.0000 mg | ORAL_TABLET | Freq: Every day | ORAL | 0 refills | Status: DC

## 2020-03-09 NOTE — Telephone Encounter (Signed)
Kenneth [name] is calling and requesting a refill for ipratropium [facility] to [facility] at [phone].

## 2020-03-09 NOTE — Patient Outreach (Signed)
[facility]) Care Management  03/09/2020  Kenneth [name] Nov 01, 1960 [id number]   Outreach attempt #3, unsuccessful.    Call placed to member to follow up on management of COPD and to complete initial assessment, no answer, HIPAA compliant voice message left.  Will follow up within the next 3 weeks, if remain unsuccessful will close case.  [name], [place], MSN [facility] Care Management  [facility] Manager [phone]

## 2020-03-09 NOTE — Telephone Encounter (Signed)
Patient is calling to get a refill on his Isosorbide Monontrate (Imdur). He states that he only has 5 pills left. If approved, please send to [facility] [address] and call patient at  [phone] to let him know that it has bee sent in.

## 2020-03-09 NOTE — Telephone Encounter (Signed)
Patient calling for refill on pending medication last OV 02/14/20. Please advise.

## 2020-03-12 ENCOUNTER — Telehealth: Payer: Self-pay | Admitting: Family Medicine

## 2020-03-12 NOTE — Telephone Encounter (Signed)
Patient called back and stated he called the pharmacy and they told him they do not show the refill approval. I verified [facility] on [address].

## 2020-03-12 NOTE — Telephone Encounter (Signed)
Patient called back to check status of this request.

## 2020-03-12 NOTE — Telephone Encounter (Signed)
Pt called about isosorbide mononitrate (IMDUR) 30 MG 24 hr tablet and said he called last week about it and wanted to know what was going on with it, I told him it shows it was sent the pharmacy on 03/09/20 and let him know to give them a call to see if they have it filled

## 2020-03-13 ENCOUNTER — Other Ambulatory Visit: Payer: Self-pay | Admitting: Family Medicine

## 2020-03-13 ENCOUNTER — Other Ambulatory Visit: Payer: Self-pay | Admitting: *Deleted

## 2020-03-13 NOTE — Telephone Encounter (Signed)
Patient called again about refills for isosorbide, trazodone, mask, filters and tubing.

## 2020-03-13 NOTE — Addendum Note (Signed)
Addended byKemper Durie on: 03/13/2020 03:42 PM   Modules accepted: Orders

## 2020-03-13 NOTE — Telephone Encounter (Signed)
Patient also needs refills on his Trazodone, mask, filters and tubing for his breathing machine. He still says [facility] does not have the isosorbide refill but I assured at every call that it was sent on 03/09/20. He is asking to be called back today.

## 2020-03-13 NOTE — Patient Outreach (Addendum)
[facility]) Care Management  03/13/2020  Milind Raether 16-Jun-1961 [id number]   Voice message received from member, call placed back to follow up.  No answer, HIPAA compliant voice message left.  Will follow up on 7/9 as scheduled.    Update:  Call received back from member, report he is still having some intermittent shortness of breath.  He was seen in the ED twice since his discharge (6/12 & 6/17).  Was given Prednisone, state he has completed that course, still have appointment to see pulmonologist on 6/24.  Report he is using his nebulizer and inhalers as instructed but also admits that he is still smoking about 3-4 cigarettes a day.    State he is having an issue with getting his medication (Imdur) refilled as well as getting new mask, tubing, and filters for his CPAP machine.  Report he has called his PCP office for this request but still has not received it.  Noted per chart that request has been sent for medication and DME supplies.  This care manager will also call pharmacy to follow up.  Member report he is in need of help with his mental state, need a new psychiatrist.  He was active with one in the past, hasn't followed up in a few years.  He is on Prozac and Buspar but report it is not working as well as it was previously.  PHQ9 score today was 17, he is open to counseling and resources through CSW.  Other than medication refill, denies any urgent concerns, denies suicidal thoughts.    Call placed to [facility] to follow up on Imdur, tech state member has already picked up prescription.  Will follow up with member within the next month.  Will send EMMI education regarding COPD management.  Will place referral to CSW.  [facility] CM Care Plan Problem One     Most Recent Value  Care Plan Problem One Knowledge deficit related to COPD management as evidenced by recent hospital admission  Role Documenting the Problem One Care Management Coordinator  Care Plan for Problem  One Active  [facility] Long Term Goal  Member will not be admitted to hospital for COPD complications within the next 45 days  [facility] Long Term Goal Start Date 02/15/20  Interventions for Problem One Long Term Goal EMMI education regarding management of COPD sent to member  [facility] CM Short Term Goal #1  Member wil report using CPAP daily over the next 2 weeks  [facility] CM Short Term Goal #1 Start Date 02/15/20  Interventions for Short Term Goal #1 Call placed to DME company for help with new supplies  [facility] CM Short Term Goal #2  Member will report taking al medications as instructed over the next 2 weeks  [facility] CM Short Term Goal #2 Start Date 02/15/20  Interventions for Short Term Goal #2 Call placed to pharmacy to follow up on refill requests       [name], RN, MSN [facility] Care Management  [facility] Care Manager [phone]

## 2020-03-14 ENCOUNTER — Telehealth: Payer: Self-pay | Admitting: Family Medicine

## 2020-03-14 NOTE — Telephone Encounter (Signed)
[facility] needs prescriptions for albuterol nebulizer solution and ipatroprium refilled. Pharmacist states they have sent requests to [place] multiple times already. Their number is [phone]. Also, patient has called our office several times requesting refills.

## 2020-03-15 ENCOUNTER — Ambulatory Visit (INDEPENDENT_AMBULATORY_CARE_PROVIDER_SITE_OTHER): Payer: Medicare Other | Admitting: Emergency Medicine

## 2020-03-15 ENCOUNTER — Encounter: Payer: Self-pay | Admitting: Emergency Medicine

## 2020-03-15 ENCOUNTER — Other Ambulatory Visit: Payer: Self-pay

## 2020-03-15 VITALS — BP 132/64 | HR 67 | Ht 72.0 in | Wt 174.4 lb

## 2020-03-15 DIAGNOSIS — J441 Chronic obstructive pulmonary disease with (acute) exacerbation: Secondary | ICD-10-CM

## 2020-03-15 DIAGNOSIS — F17210 Nicotine dependence, cigarettes, uncomplicated: Secondary | ICD-10-CM

## 2020-03-15 DIAGNOSIS — J9621 Acute and chronic respiratory failure with hypoxia: Secondary | ICD-10-CM | POA: Diagnosis not present

## 2020-03-15 DIAGNOSIS — R0602 Shortness of breath: Secondary | ICD-10-CM

## 2020-03-15 DIAGNOSIS — Z720 Tobacco use: Secondary | ICD-10-CM

## 2020-03-15 MED ORDER — TRELEGY ELLIPTA 100-62.5-25 MCG/INH IN AEPB
1.0000 | INHALATION_SPRAY | Freq: Every day | RESPIRATORY_TRACT | 0 refills | Status: DC
Start: 2020-03-15 — End: 2020-04-02

## 2020-03-15 NOTE — Patient Instructions (Addendum)
We will perform full pulmonary function testing in next office visit We will perform walking oximetry today on room air Please stop Advair We will start Trelegy 1 inhalation once daily.  Please rinse and gargle after using. Keep your albuterol available to use 2 puffs when needed for shortness of breath, chest tightness, wheezing. Congratulations on decreasing your smoking.  Continue to work hard on cutting down.  At your next visit we can talk about possibly setting a quit date. Follow with Dr Delton Coombes next available with PFT.

## 2020-03-15 NOTE — Assessment & Plan Note (Signed)
Significant COPD, continued tobacco.  Suspect that given his degree of obstruction he will benefit from addition of LAMA.  Evaluate for exertional hypoxemia today  We will perform full pulmonary function testing in next office visit Please stop Advair We will start Trelegy 1 inhalation once daily.  Please rinse and gargle after using. Keep your albuterol available to use 2 puffs when needed for shortness of breath, chest tightness, wheezing. Follow with Dr Delton Coombes next available with PFT.

## 2020-03-15 NOTE — Assessment & Plan Note (Signed)
Discussed smoking cessation with him.  He is working on decreasing.  We can talk next time about possibly setting a quit date.  He is open to this.

## 2020-03-15 NOTE — Telephone Encounter (Signed)
Called pt to inform that forms have been faxed for medications.

## 2020-03-15 NOTE — Progress Notes (Signed)
Subjective:    Patient ID: Kenneth [name], male    DOB: 1961-06-10, 59 y.o.   MRN: [id number]  HPI 59 year old smoker (50+ pack years) with a history of atrial fibrillation on anticoagulation, diabetes, hypertension, OSA on CPAP. He carries a history of asthma / COPD that was made many years ago. He is on Advair, uses albuterol several times a day, atrovent nebs qd.   He has been dealing with progressive dyspnea, often w exertion but can happen at rest as well. Happening over the last year. Lots of cough, usually dry. He hears wheeze most days. Has benefited from pred - frequent exacerbations.   He has good compliance with CPAP, an an auto-set 13-20cm H2O   Review of Systems As per HPI  Past Medical History:  Diagnosis Date  . Arthritis   . Asthma   . Atrial fibrillation ([place])   . CHF (congestive heart failure) ([place])   . Depression   . Diabetes mellitus without complication ([place])   . Emphysema of lung ([place])   . Heart murmur   . Hyperlipidemia   . Hypertension      Family History  Problem Relation Age of Onset  . Diabetes Sister   . Coronary artery disease Brother      Social History   Socioeconomic History  . Marital status: Legally Separated    Spouse name: Not on file  . Number of children: Not on file  . Years of education: Not on file  . Highest education level: Not on file  Occupational History  . Not on file  Tobacco Use  . Smoking status: Current Some Day Smoker    Packs/day: 2.00    Years: 30.00    Pack years: 60.00    Types: Cigarettes  . Smokeless tobacco: Never Used  . Tobacco comment: smokes 2-3cigs per day  Substance and Sexual Activity  . Alcohol use: Yes    Comment: Drinks up to six beers around a game  . Drug use: Yes    Types: Marijuana    Comment: occ marijuana  . Sexual activity: Yes    Partners: Female  Other Topics Concern  . Not on file  Social History Narrative  . Not on file   Social Determinants of Health   Financial  Resource Strain:   . Difficulty of Paying Living Expenses:   Food Insecurity: No Food Insecurity  . Worried About Charity fundraiser in the Last Year: Never true  . Ran Out of Food in the Last Year: Never true  Transportation Needs: No Transportation Needs  . Lack of Transportation (Medical): No  . Lack of Transportation (Non-Medical): No  Physical Activity:   . Days of Exercise per Week:   . Minutes of Exercise per Session:   Stress:   . Feeling of Stress :   Social Connections:   . Frequency of Communication with Friends and Family:   . Frequency of Social Gatherings with Friends and Family:   . Attends Religious Services:   . Active Member of Clubs or Organizations:   . Attends Archivist Meetings:   [name] Marital Status:   Intimate Partner Violence:   . Fear of Current or Ex-Partner:   . Emotionally Abused:   [name] Physically Abused:   . Sexually Abused:      Allergies  Allergen Reactions  . Lisinopril Swelling  . Other Other (See Comments)    Lettuce : rash  . Tomato Rash     Outpatient  Medications Prior to Visit  Medication Sig Dispense Refill  . albuterol (PROVENTIL HFA;VENTOLIN HFA) 108 (90 Base) MCG/ACT inhaler Inhale 1-2 puffs into the lungs every 4 (four) hours as needed for wheezing or shortness of breath.     [name] albuterol (PROVENTIL) (2.5 MG/3ML) 0.083% nebulizer solution Take 3 mLs (2.5 mg total) by nebulization 5 (five) times daily as needed for wheezing or shortness of breath. 150 mL 3  . amLODipine (NORVASC) 10 MG tablet Take 1 tablet (10 mg total) by mouth daily. 90 tablet 1  . apixaban (ELIQUIS) 5 MG TABS tablet Take 1 tablet (5 mg total) by mouth 2 (two) times daily. 180 tablet 1  . aspirin EC 81 MG tablet Take 81 mg by mouth daily.    . Blood Glucose Monitoring Suppl (ONE TOUCH ULTRA 2) w/Device KIT Use to test blood sugars 1-2 times daily. 1 kit 0  . busPIRone (BUSPAR) 15 MG tablet Take 1 tablet (15 mg total) by mouth 2 (two) times daily. 180 tablet  1  . carvedilol (COREG) 12.5 MG tablet Take 1 tablet (12.5 mg total) by mouth 2 (two) times daily with a meal. 60 tablet 0  . famotidine (PEPCID) 20 MG tablet TAKE 1 TABLET BY MOUTH  DAILY (Patient taking differently: Take 20 mg by mouth daily. ) 90 tablet 3  . FLUoxetine (PROZAC) 20 MG capsule TAKE 3 CAPSULES BY MOUTH  DAILY (Patient taking differently: Take 60 mg by mouth daily. ) 270 capsule 3  . glucose blood (ONETOUCH ULTRA) test strip Use to test blood sugars 1-2 times daily. 100 each 12  . ipratropium (ATROVENT) 0.02 % nebulizer solution Take 2.5 mLs (0.5 mg total) by nebulization 4 (four) times daily. 75 mL 12  . isosorbide mononitrate (IMDUR) 30 MG 24 hr tablet Take 1 tablet (30 mg total) by mouth daily. 30 tablet 0  . Lancets (ONETOUCH ULTRASOFT) lancets Use to test blood sugars 1-2 times daily. 100 each 12  . losartan (COZAAR) 25 MG tablet Take 1 tablet (25 mg total) by mouth daily. Please schedule annual appt with Dr. Cristal Deer for refills. [phone]. 3rd attempt. (Patient taking differently: Take 25 mg by mouth daily. ) 7 tablet 0  . metFORMIN (GLUCOPHAGE) 500 MG tablet TAKE 1 TABLET BY MOUTH TWO  TIMES DAILY WITH MEALS (Patient taking differently: Take 500 mg by mouth 2 (two) times daily with a meal. ) 180 tablet 3  . Multiple Vitamin (MULTIVITAMIN WITH MINERALS) TABS tablet Take 1 tablet by mouth daily.    . nitroGLYCERIN (NITROSTAT) 0.4 MG SL tablet DISSOLVE 1 TABLET UNDER THE TONGUE EVERY 5 MINUTES AS  NEEDED FOR CHEST PAIN. MAX  OF 3 TABLETS IN 15 MINUTES. CALL 911 IF PAIN PERSISTS. (Patient taking differently: Place 0.4 mg under the tongue every 5 (five) minutes as needed for chest pain. ) 75 tablet 4  . traZODone (DESYREL) 50 MG tablet TAKE 1/2 TO 1 TABLET BY  MOUTH AT BEDTIME AS NEEDED  FOR SLEEP. (Patient taking differently: Take 50 mg by mouth at bedtime as needed for sleep. ) 30 tablet 11  . Fluticasone-Salmeterol (ADVAIR DISKUS) 250-50 MCG/DOSE AEPB Inhale 1 puff into the  lungs 2 (two) times daily. 1 each 3  . predniSONE (STERAPRED UNI-PAK 21 TAB) 10 MG (21) TBPK tablet Take 6 tabs by mouth for 2 days, then 5 tabs for 2 days, then 4 tabs for 2 days, then 3 tabs for 2 days, 2 tabs for 2 days, then 1 tabfor 2 days (Patient  not taking: Reported on 03/08/2020) 42 tablet 0   No facility-administered medications prior to visit.         Objective:   Physical Exam  Vitals:   03/15/20 1045  BP: 132/64  Pulse: 67  SpO2: 98%  Weight: 174 lb 6.4 oz (79.1 kg)  Height: 6' (1.829 m)   Gen: Pleasant, well-nourished, in no distress,  normal affect  ENT: No lesions,  mouth clear,  oropharynx clear, no postnasal drip  Neck: No JVD, no stridor  Lungs: No use of accessory muscles, B exp wheeze more prominent with forced exp  Cardiovascular: RRR, heart sounds normal, no murmur or gallops, no peripheral edema  Musculoskeletal: No deformities, no cyanosis or clubbing  Neuro: alert, awake, non focal  Skin: Warm, no lesions or rash     Assessment & Plan:  Chronic obstructive pulmonary disease (HCC) Significant COPD, continued tobacco.  Suspect that given his degree of obstruction he will benefit from addition of LAMA.  Evaluate for exertional hypoxemia today  We will perform full pulmonary function testing in next office visit Please stop Advair We will start Trelegy 1 inhalation once daily.  Please rinse and gargle after using. Keep your albuterol available to use 2 puffs when needed for shortness of breath, chest tightness, wheezing. Follow with Dr Lamonte Sakai next available with PFT.   Acute on chronic respiratory failure with hypoxemia (HCC) We will perform walking oximetry today on room air  Tobacco abuse Discussed smoking cessation with him.  He is working on decreasing.  We can talk next time about possibly setting a quit date.  He is open to this.  Baltazar Apo, MD, PhD 03/15/2020, 5:31 PM [facility] [phone] or if no answer  [phone]

## 2020-03-15 NOTE — Assessment & Plan Note (Signed)
We will perform walking oximetry today on room air

## 2020-03-19 NOTE — Progress Notes (Signed)
Cardiology Office Note   Date:  03/20/2020   ID:  Kenneth [name], DOB [birth year], MRN [id number]  PCP:  [name], MD  Cardiologist:  Dr. Harrell Gave CC: Hospital Follow Up   History of Present Illness: Kenneth [name] is a 59 y.o. male who presents for ongoing assessment and management of hypertension, nonischemic cardiomyopathy with an EF of 30% to 35%, atrial fibrillation on apixaban (CHADS VAC Score of 3), followed in the A. fib clinic.  With other history to include type 2 diabetes, COPD, OSA on CPAP followed by Dr. Claiborne Billings and tobacco use, and EtOH abuse.    The patient was established with Dr. Harrell Gave on 10/21/2018, having seen a cardiologist in [place], and also in [place], but recently moved back to [place].  On review of prior notes Dr. Harrell Gave noted that the patient has a history of angioedema on ACE inhibitors but was able to tolerate losartan.  He was changed off of ARB and placed on hydralazine instead when being seen in [place].  Patient had a cardiac catheterization in 2011 which showed normal coronaries with reduced EF of 40%.   When last seen by Dr. Harrell Gave on 10/21/2018 the patient was continued on carvedilol 6.25 mg twice daily, he was found to be bradycardic and therefore was unable to uptitrate.  He was tolerating losartan in the past and therefore this was restarted at 25 mg daily, discussion about Delene Loll was had.  He wanted to make sure he was tolerating ARB first.  He was also given education concerning daily weights and low-sodium diet.  He was also counseled on tobacco cessation and risk of worsening COPD and increases risk for CAD.  He verbalized understanding.  The patient was admitted on 02/10/2019 and seen on consultation by cardiology for complaints of chest pain.  The patient was ruled out for ACS and it was felt that the patient's chest pain was related to COPD exacerbation.  Pain resolved with treatment of nebulizer and  steroids.  On follow-up today he complains of heartburn symptoms.  He has had no exacerbation of dyspnea.  He is going to be followed up by PCP and by pulmonology to monitor this more fully.  Pulmonology is going to test him for need for daytime oxygen along with his oxygen at nighttime.  Past Medical History:  Diagnosis Date  . Arthritis   . Asthma   . Atrial fibrillation ([place])   . CHF (congestive heart failure) ([place])   . Depression   . Diabetes mellitus without complication ([place])   . Emphysema of lung ([place])   . Heart murmur   . Hyperlipidemia   . Hypertension     No past surgical history on file.   Current Outpatient Medications  Medication Sig Dispense Refill  . albuterol (PROVENTIL) (2.5 MG/3ML) 0.083% nebulizer solution Take 3 mLs (2.5 mg total) by nebulization 5 (five) times daily as needed for wheezing or shortness of breath. 150 mL 3  . amLODipine (NORVASC) 10 MG tablet Take 1 tablet (10 mg total) by mouth daily. 90 tablet 1  . apixaban (ELIQUIS) 5 MG TABS tablet Take 1 tablet (5 mg total) by mouth 2 (two) times daily. 180 tablet 1  . aspirin EC 81 MG tablet Take 81 mg by mouth daily.    . Blood Glucose Monitoring Suppl (ONE TOUCH ULTRA 2) w/Device KIT Use to test blood sugars 1-2 times daily. 1 kit 0  . busPIRone (BUSPAR) 15 MG tablet Take 1 tablet (15 mg total)  by mouth 2 (two) times daily. 180 tablet 1  . carvedilol (COREG) 12.5 MG tablet Take 1 tablet (12.5 mg total) by mouth 2 (two) times daily with a meal. 60 tablet 0  . famotidine (PEPCID) 20 MG tablet TAKE 1 TABLET BY MOUTH  DAILY (Patient taking differently: Take 20 mg by mouth daily. ) 90 tablet 3  . FLUoxetine (PROZAC) 20 MG capsule TAKE 3 CAPSULES BY MOUTH  DAILY (Patient taking differently: Take 60 mg by mouth daily. ) 270 capsule 3  . Fluticasone-Umeclidin-Vilant (TRELEGY ELLIPTA) 100-62.5-25 MCG/INH AEPB Inhale 1 puff into the lungs daily. 14 each 0  . glucose blood (ONETOUCH ULTRA) test strip Use to test blood  sugars 1-2 times daily. 100 each 12  . ipratropium (ATROVENT) 0.02 % nebulizer solution Take 2.5 mLs (0.5 mg total) by nebulization 4 (four) times daily. 75 mL 12  . isosorbide mononitrate (IMDUR) 30 MG 24 hr tablet Take 1 tablet (30 mg total) by mouth daily. 30 tablet 0  . Lancets (ONETOUCH ULTRASOFT) lancets Use to test blood sugars 1-2 times daily. 100 each 12  . losartan (COZAAR) 25 MG tablet Take 1 tablet (25 mg total) by mouth daily. Please schedule annual appt with Dr. Harrell Gave for refills. [phone]. 3rd attempt. (Patient taking differently: Take 25 mg by mouth daily. ) 7 tablet 0  . metFORMIN (GLUCOPHAGE) 500 MG tablet TAKE 1 TABLET BY MOUTH TWO  TIMES DAILY WITH MEALS (Patient taking differently: Take 500 mg by mouth 2 (two) times daily with a meal. ) 180 tablet 3  . Multiple Vitamin (MULTIVITAMIN WITH MINERALS) TABS tablet Take 1 tablet by mouth daily.    . nitroGLYCERIN (NITROSTAT) 0.4 MG SL tablet DISSOLVE 1 TABLET UNDER THE TONGUE EVERY 5 MINUTES AS  NEEDED FOR CHEST PAIN. MAX  OF 3 TABLETS IN 15 MINUTES. CALL 911 IF PAIN PERSISTS. (Patient taking differently: Place 0.4 mg under the tongue every 5 (five) minutes as needed for chest pain. ) 75 tablet 4  . traZODone (DESYREL) 50 MG tablet TAKE 1/2 TO 1 TABLET BY  MOUTH AT BEDTIME AS NEEDED  FOR SLEEP. (Patient taking differently: Take 50 mg by mouth at bedtime as needed for sleep. ) 30 tablet 11  . albuterol (PROVENTIL HFA;VENTOLIN HFA) 108 (90 Base) MCG/ACT inhaler Inhale 1-2 puffs into the lungs every 4 (four) hours as needed for wheezing or shortness of breath.  (Patient not taking: Reported on 03/20/2020)     No current facility-administered medications for this visit.    Allergies:   Lisinopril, Other, and Tomato    Social History:  The patient  reports that he has been smoking cigarettes. He has a 60.00 pack-year smoking history. He has never used smokeless tobacco. He reports current alcohol use. He reports current drug use.  Drug: Marijuana.   Family History:  The patient's family history includes Coronary artery disease in his brother; Diabetes in his sister.    ROS: All other systems are reviewed and negative. Unless otherwise mentioned in H&P    PHYSICAL EXAM: VS:  BP 122/78   Pulse 71   Temp (!) 97.2 F (36.2 C)   Ht 6' (1.829 m)   Wt 170 lb 6.4 oz (77.3 kg)   SpO2 94%   BMI 23.11 kg/m  , BMI Body mass index is 23.11 kg/m. GEN: Well nourished, well developed, in no acute distress HEENT: normal Neck: no JVD, carotid bruits, or masses Cardiac: RRR; no murmurs, rubs, or gallops,no edema  Respiratory: Bibasilar crackles  to the middle lobes without wheezing.  Some clears with cough. GI: soft, nontender, nondistended, + BS MS: no deformity or atrophy Skin: warm and dry, no rash Neuro:  Strength and sensation are intact Psych: euthymic mood, full affect   EKG: Not completed this office visit  Recent Labs: 03/08/2020: ALT 65; B Natriuretic Peptide 716.9; BUN 11; Creatinine, Ser 1.22; Hemoglobin 12.1; Platelets 256; Potassium 4.6; Sodium 138    Lipid Panel    Component Value Date/Time   CHOL 226 (H) 07/06/2018 1355   TRIG 190.0 (H) 07/06/2018 1355   HDL 68.40 07/06/2018 1355   CHOLHDL 3 07/06/2018 1355   VLDL 38.0 07/06/2018 1355   LDLCALC 119 (H) 07/06/2018 1355      Wt Readings from Last 3 Encounters:  03/20/20 170 lb 6.4 oz (77.3 kg)  03/15/20 174 lb 6.4 oz (79.1 kg)  03/08/20 174 lb (78.9 kg)      Other studies Reviewed: Echo 09/21/18 Left ventricle: The cavity size was normal. Wall thickness was normal. Systolic function was moderately to severely reduced. The estimated ejection fraction was in the range of 30% to 35%. Diffuse hypokinesis. There is hypokinesis of the inferior and inferoseptal myocardium. Features are consistent with a pseudonormal left ventricular filling pattern, with concomitant abnormal relaxation and increased filling pressure (grade  2 diastolic dysfunction). - Aortic valve: Trileaflet; moderately thickened, moderately calcified leaflets. There was mild regurgitation. - Left atrium: The atrium was moderately dilated. - Tricuspid valve: There was trivial regurgitation. - Pulmonary arteries: Systolic pressure was mildly increased. PA peak pressure: 32 mm Hg (S).  ASSESSMENT AND PLAN:  1.  Chest pain: Recent hospitalization for same ruled out for ACS.  Now having heartburn symptoms since discharge.  Causing some discomfort in his chest.  He is on H2 blocker but I will start him on a PPI at low-dose, Protonix 20 mg daily.  He is to follow-up with PCP to evaluate its effectiveness and need to increase the dose or have referral to GI.  2.  Nonischemic cardiomyopathy: EF most recent echo in September 2020 was found to be 30 to 35%.  Dr. Harrell Gave would like for him to transition to [facility].  The patient wishes to wait until following up with her to make that transition.  For now we will leave him him losartan as he is tolerating it.  He may benefit more from [employer] which have explained to him however he does want to wait a little longer before switching medications since he is recently been discharged from the hospital.  3.  COPD: Recent hospitalization with exacerbation.  Being followed by primary pulmonologist Dr. Lamonte Sakai.  He is due to follow-up with him in 1 month to have pulmonary function tests and also need for daytime oxygen.  4.  GERD: Try him on PPI but he will need to follow-up with primary care for ongoing evaluation of its effectiveness and need for titration.  He will continue H2 blocker as directed.  He is advised to reduce his caffeine intake as this can help exacerbate GERD symptoms.   Current medicines are reviewed at length with the patient today.  I have spent 30 minutes dedicated to the care of this patient on the date of this encounter to include pre-visit review of records, assessment, management and  diagnostic testing,with shared decision making.  Labs/ tests ordered today include: None   Phill Myron. West Pugh, ANP, AACC   03/20/2020 8:25 AM    [place] [address] Office [phone] Fax [phone]  Notice: This dictation was prepared with Dragon dictation along with smaller phrase technology. Any transcriptional errors that result from this process are unintentional and may not be corrected upon review.

## 2020-03-19 NOTE — Telephone Encounter (Signed)
[facility] states they faxed these orders back to [place] because they were not dated. She would like to know status of receiving these orders dated and faxed back to them. Albuterol and supplies. Morrie Sheldon, phone# [phone].

## 2020-03-19 NOTE — Telephone Encounter (Signed)
Spoke with [facility] who verbally understood both Rx forms were dated   and sent back on 03/15/20

## 2020-03-20 ENCOUNTER — Ambulatory Visit (INDEPENDENT_AMBULATORY_CARE_PROVIDER_SITE_OTHER): Payer: Medicare Other | Admitting: Adult Health

## 2020-03-20 ENCOUNTER — Encounter: Payer: Self-pay | Admitting: Adult Health

## 2020-03-20 ENCOUNTER — Other Ambulatory Visit: Payer: Self-pay

## 2020-03-20 VITALS — BP 122/78 | HR 71 | Temp 97.2°F | Ht 72.0 in | Wt 170.4 lb

## 2020-03-20 DIAGNOSIS — Z720 Tobacco use: Secondary | ICD-10-CM

## 2020-03-20 DIAGNOSIS — I5022 Chronic systolic (congestive) heart failure: Secondary | ICD-10-CM | POA: Diagnosis not present

## 2020-03-20 DIAGNOSIS — J441 Chronic obstructive pulmonary disease with (acute) exacerbation: Secondary | ICD-10-CM

## 2020-03-20 DIAGNOSIS — K219 Gastro-esophageal reflux disease without esophagitis: Secondary | ICD-10-CM

## 2020-03-20 DIAGNOSIS — I43 Cardiomyopathy in diseases classified elsewhere: Secondary | ICD-10-CM

## 2020-03-20 MED ORDER — CARVEDILOL 12.5 MG PO TABS: 12.5000 mg | ORAL_TABLET | Freq: Two times a day (BID) | ORAL | 3 refills | Status: DC

## 2020-03-20 MED ORDER — PANTOPRAZOLE SODIUM 20 MG PO TBEC
20.0000 mg | DELAYED_RELEASE_TABLET | Freq: Every day | ORAL | 3 refills | Status: DC
Start: 2020-03-20 — End: 2020-04-30

## 2020-03-20 MED ORDER — ISOSORBIDE MONONITRATE ER 30 MG PO TB24: 30.0000 mg | ORAL_TABLET | Freq: Every day | ORAL | 3 refills | Status: DC

## 2020-03-20 MED ORDER — APIXABAN 5 MG PO TABS: 5.0000 mg | ORAL_TABLET | Freq: Two times a day (BID) | ORAL | 3 refills | Status: DC

## 2020-03-20 NOTE — Patient Instructions (Signed)
Medication Instructions:  START- Pantoprazole 20 mg by mouth daily  *If you need a refill on your cardiac medications before your next appointment, please call your pharmacy*   Lab Work: None Ordered   Testing/Procedures: None Ordered   Follow-Up: At [facility], you and your health needs are our priority.  As part of our continuing mission to provide you with exceptional heart care, we have created designated Provider Care Teams.  These Care Teams include your primary Cardiologist (physician) and Advanced Practice Providers (APPs -  Physician Assistants and Nurse Practitioners) who all work together to provide you with the care you need, when you need it.  We recommend signing up for the patient portal called "MyChart".  Sign up information is provided on this After Visit Summary.  MyChart is used to connect with patients for Virtual Visits (Telemedicine).  Patients are able to view lab/test results, encounter notes, upcoming appointments, etc.  Non-urgent messages can be sent to your provider as well.   To learn more about what you can do with MyChart, go to [email or web address].    Your next appointment:   3-4 month(s)  The format for your next appointment:   In Person  Provider:   You may see Jodelle Red, MD or one of the following Advanced Practice Providers on your designated Care Team:    Theodore Demark, PA-C  Joni Reining, DNP, ANP  Cadence Fransico Michael, NP

## 2020-03-22 ENCOUNTER — Telehealth: Payer: Self-pay | Admitting: Family Medicine

## 2020-03-22 ENCOUNTER — Other Ambulatory Visit: Payer: Self-pay

## 2020-03-22 ENCOUNTER — Ambulatory Visit (INDEPENDENT_AMBULATORY_CARE_PROVIDER_SITE_OTHER): Payer: Medicare Other | Admitting: Nurse Practitioner

## 2020-03-22 ENCOUNTER — Other Ambulatory Visit: Payer: Self-pay | Admitting: Family Medicine

## 2020-03-22 ENCOUNTER — Encounter: Payer: Self-pay | Admitting: Nurse Practitioner

## 2020-03-22 ENCOUNTER — Other Ambulatory Visit: Payer: Self-pay | Admitting: *Deleted

## 2020-03-22 VITALS — BP 130/70 | HR 74 | Temp 97.1°F | Ht 72.0 in | Wt 169.6 lb

## 2020-03-22 DIAGNOSIS — K5909 Other constipation: Secondary | ICD-10-CM

## 2020-03-22 DIAGNOSIS — K219 Gastro-esophageal reflux disease without esophagitis: Secondary | ICD-10-CM

## 2020-03-22 MED ORDER — LUBIPROSTONE 8 MCG PO CAPS: 8.0000 ug | ORAL_CAPSULE | Freq: Two times a day (BID) | ORAL | 5 refills | Status: DC

## 2020-03-22 NOTE — Progress Notes (Signed)
Subjective:  Patient ID: Harriet Bollen, male    DOB: [birth year]  Age: 59 y.o. MRN: [id number]  CC: GI Problem (pt states hes had problems with his stomach-constipation and then diarrhea for last couple of weeks//wondering if it could be medicine//pt states been in the hospital for GI, heart and breathing problems//FYI-him and gf asking if takin new pts)  Constipation This is a chronic problem. The current episode started more than 1 year ago. The problem has been waxing and waning since onset. His stool frequency is 2 to 3 times per week. The stool is described as firm, pellet like and watery. The patient is not on a high fiber diet. He does not exercise regularly. There has not been adequate water intake. Associated symptoms include abdominal pain, anorexia, bloating and diarrhea. Pertinent negatives include no back pain, difficulty urinating, fecal incontinence, fever, flatus, hematochezia, hemorrhoids, melena, nausea, rectal pain, vomiting or weight loss. He has tried laxatives for the symptoms. The treatment provided moderate relief. His past medical history is significant for metabolic disease.    Reviewed past Medical, Social and Family history today.  Outpatient Medications Prior to Visit  Medication Sig Dispense Refill  . albuterol (PROVENTIL HFA;VENTOLIN HFA) 108 (90 Base) MCG/ACT inhaler Inhale 1-2 puffs into the lungs every 4 (four) hours as needed for wheezing or shortness of breath.     [name] albuterol (PROVENTIL) (2.5 MG/3ML) 0.083% nebulizer solution Take 3 mLs (2.5 mg total) by nebulization 5 (five) times daily as needed for wheezing or shortness of breath. 150 mL 3  . amLODipine (NORVASC) 10 MG tablet Take 1 tablet (10 mg total) by mouth daily. 90 tablet 1  . apixaban (ELIQUIS) 5 MG TABS tablet Take 1 tablet (5 mg total) by mouth 2 (two) times daily. 180 tablet 3  . aspirin EC 81 MG tablet Take 81 mg by mouth daily.    . Blood Glucose Monitoring Suppl (ONE TOUCH ULTRA 2) w/Device KIT  Use to test blood sugars 1-2 times daily. 1 kit 0  . busPIRone (BUSPAR) 15 MG tablet Take 1 tablet (15 mg total) by mouth 2 (two) times daily. 180 tablet 1  . carvedilol (COREG) 12.5 MG tablet Take 1 tablet (12.5 mg total) by mouth 2 (two) times daily with a meal. 90 tablet 3  . famotidine (PEPCID) 20 MG tablet TAKE 1 TABLET BY MOUTH  DAILY (Patient taking differently: Take 20 mg by mouth daily. ) 90 tablet 3  . FLUoxetine (PROZAC) 20 MG capsule TAKE 3 CAPSULES BY MOUTH  DAILY (Patient taking differently: Take 60 mg by mouth daily. ) 270 capsule 3  . Fluticasone-Umeclidin-Vilant (TRELEGY ELLIPTA) 100-62.5-25 MCG/INH AEPB Inhale 1 puff into the lungs daily. 14 each 0  . glucose blood (ONETOUCH ULTRA) test strip Use to test blood sugars 1-2 times daily. 100 each 12  . ipratropium (ATROVENT) 0.02 % nebulizer solution Take 2.5 mLs (0.5 mg total) by nebulization 4 (four) times daily. 75 mL 12  . isosorbide mononitrate (IMDUR) 30 MG 24 hr tablet Take 1 tablet (30 mg total) by mouth daily. 90 tablet 3  . Lancets (ONETOUCH ULTRASOFT) lancets Use to test blood sugars 1-2 times daily. 100 each 12  . losartan (COZAAR) 25 MG tablet Take 1 tablet (25 mg total) by mouth daily. Please schedule annual appt with Dr. Harrell Gave for refills. [phone]. 3rd attempt. (Patient taking differently: Take 25 mg by mouth daily. ) 7 tablet 0  . metFORMIN (GLUCOPHAGE) 500 MG tablet TAKE 1 TABLET BY MOUTH  TWO  TIMES DAILY WITH MEALS (Patient taking differently: Take 500 mg by mouth 2 (two) times daily with a meal. ) 180 tablet 3  . Multiple Vitamin (MULTIVITAMIN WITH MINERALS) TABS tablet Take 1 tablet by mouth daily.    . nitroGLYCERIN (NITROSTAT) 0.4 MG SL tablet DISSOLVE 1 TABLET UNDER THE TONGUE EVERY 5 MINUTES AS  NEEDED FOR CHEST PAIN. MAX  OF 3 TABLETS IN 15 MINUTES. CALL 911 IF PAIN PERSISTS. (Patient taking differently: Place 0.4 mg under the tongue every 5 (five) minutes as needed for chest pain. ) 75 tablet 4  .  pantoprazole (PROTONIX) 20 MG tablet Take 1 tablet (20 mg total) by mouth daily. 90 tablet 3  . traZODone (DESYREL) 50 MG tablet TAKE 1/2 TO 1 TABLET BY  MOUTH AT BEDTIME AS NEEDED  FOR SLEEP. (Patient taking differently: Take 50 mg by mouth at bedtime as needed for sleep. ) 30 tablet 11   No facility-administered medications prior to visit.    ROS See HPI  Objective:  BP 130/70   Pulse 74   Temp (!) 97.1 F (36.2 C) (Tympanic)   Ht 6' (1.829 m)   Wt 169 lb 9.6 oz (76.9 kg)   SpO2 95%   BMI 23.00 kg/m   BP Readings from Last 3 Encounters:  03/22/20 130/70  03/20/20 122/78  03/15/20 132/64    Wt Readings from Last 3 Encounters:  03/22/20 169 lb 9.6 oz (76.9 kg)  03/20/20 170 lb 6.4 oz (77.3 kg)  03/15/20 174 lb 6.4 oz (79.1 kg)    Physical Exam Constitutional:      General: He is not in acute distress. Cardiovascular:     Pulses: Normal pulses.     Heart sounds: Normal heart sounds.  Pulmonary:     Effort: Pulmonary effort is normal.     Breath sounds: Rales present.  Abdominal:     General: Bowel sounds are normal. There is no distension.     Palpations: Abdomen is soft.     Tenderness: There is no abdominal tenderness. There is no guarding.  Musculoskeletal:     Right lower leg: No edema.     Left lower leg: No edema.  Neurological:     Mental Status: He is alert and oriented to person, place, and time.  Psychiatric:        Mood and Affect: Mood is anxious.        Behavior: Behavior is cooperative.    Lab Results  Component Value Date   WBC 6.8 03/08/2020   HGB 12.1 (L) 03/08/2020   HCT 37.9 (L) 03/08/2020   PLT 256 03/08/2020   GLUCOSE 145 (H) 03/08/2020   CHOL 226 (H) 07/06/2018   TRIG 190.0 (H) 07/06/2018   HDL 68.40 07/06/2018   LDLCALC 119 (H) 07/06/2018   ALT 65 (H) 03/08/2020   AST 54 (H) 03/08/2020   NA 138 03/08/2020   K 4.6 03/08/2020   CL 95 (L) 03/08/2020   CREATININE 1.22 03/08/2020   BUN 11 03/08/2020   CO2 31 03/08/2020   TSH  4.32 07/06/2018   PSA 0.93 05/20/2019   INR 1.1 03/08/2020   HGBA1C 6.3 (H) 02/08/2020   MICROALBUR <0.7 07/06/2018    DG Chest Portable 1 View  Result Date: 03/08/2020 CLINICAL DATA:  Respiratory distress EXAM: PORTABLE CHEST 1 VIEW COMPARISON:  03/03/2020 FINDINGS: The heart size and mediastinal contours are within normal limits. Both lungs are clear. The visualized skeletal structures are unremarkable. IMPRESSION: No active  disease. Electronically Signed   By: Inez Catalina M.D.   On: 03/08/2020 14:57    Assessment & Plan:  This visit occurred during the SARS-CoV-2 public health emergency.  Safety protocols were in place, including screening questions prior to the visit, additional usage of staff PPE, and extensive cleaning of exam room while observing appropriate contact time as indicated for disinfecting solutions.   Jaidev was seen today for gi problem.  Diagnoses and all orders for this visit:  Chronic constipation -     lubiprostone ([name]) 8 MCG capsule; Take 1 capsule (8 mcg total) by mouth 2 (two) times daily with a meal. -     Ambulatory referral to Gastroenterology   I am having [name] start on lubiprostone. I am also having him maintain his albuterol, famotidine, onetouch ultrasoft, ONE TOUCH ULTRA 2, OneTouch Ultra, traZODone, metFORMIN, FLUoxetine, nitroGLYCERIN, amLODipine, ipratropium, busPIRone, losartan, aspirin EC, multivitamin with minerals, albuterol, Trelegy Ellipta, apixaban, isosorbide mononitrate, carvedilol, and pantoprazole.  Meds ordered this encounter  Medications  . lubiprostone ([name]) 8 MCG capsule    Sig: Take 1 capsule (8 mcg total) by mouth 2 (two) times daily with a meal.    Dispense:  30 capsule    Refill:  5    Order Specific Question:   Supervising Provider    Answer:   Ronnald Nian [[id number]]    Problem List Items Addressed This Visit      Digestive   Chronic constipation - Primary   Relevant Medications   lubiprostone  ([name]) 8 MCG capsule   Other Relevant Orders   Ambulatory referral to Gastroenterology       Follow-up: No follow-ups on file.  [name], NP

## 2020-03-22 NOTE — Patient Outreach (Signed)
[facility]) Care Management  03/22/2020  Amardeep Beckers 1960/12/20 [id number]  CSW received referral on 03/13/2020 for mental health assistance.  CSW attempted outreach to pt on 6/30 and was unsuccessful and left a HIPPA compliant voice message. CSW will send pt an an Unsuccessful Outreach letter and plan follow up outreach attempt per policy in 3-4 business days.   [name], MSW, LCSW Clinical Social Worker  [facility] [employer] [phone]

## 2020-03-22 NOTE — Patient Instructions (Addendum)
Per cardiology note, you are to limit liquid intake to <2L Start high fiber diet Start use of amitiza Avoid use of laxative  You will be contacted to schedule appt with GI   Constipation, Adult Constipation is when a person:  Poops (has a bowel movement) fewer times in a week than normal.  Has a hard time pooping.  Has poop that is dry, hard, or bigger than normal. Follow these instructions at home: Eating and drinking   Eat foods that have a lot of fiber, such as: ? Fresh fruits and vegetables. ? Whole grains. ? Beans.  Eat less of foods that are high in fat, low in fiber, or overly processed, such as: ? [place] fries. ? Hamburgers. ? Cookies. ? Candy. ? Soda.  Drink enough fluid to keep your pee (urine) clear or pale yellow. General instructions  Exercise regularly or as told by your doctor.  Go to the restroom when you feel like you need to poop. Do not hold it in.  Take over-the-counter and prescription medicines only as told by your doctor. These include any fiber supplements.  Do pelvic floor retraining exercises, such as: ? Doing deep breathing while relaxing your lower belly (abdomen). ? Relaxing your pelvic floor while pooping.  Watch your condition for any changes.  Keep all follow-up visits as told by your doctor. This is important. Contact a doctor if:  You have pain that gets worse.  You have a fever.  You have not pooped for 4 days.  You throw up (vomit).  You are not hungry.  You lose weight.  You are bleeding from the anus.  You have thin, pencil-like poop (stool). Get help right away if:  You have a fever, and your symptoms suddenly get worse.  You leak poop or have blood in your poop.  Your belly feels hard or bigger than normal (is bloated).  You have very bad belly pain.  You feel dizzy or you faint. This information is not intended to replace advice given to you by your health care provider. Make sure you discuss any  questions you have with your health care provider. Document Revised: 08/21/2017 Document Reviewed: 02/27/2016 Elsevier Patient Education  2020 [employer].

## 2020-03-22 NOTE — Telephone Encounter (Signed)
Patient left a message with the after hours nurse line requesting a call from his provider. Per message, patient would not give any information as to what it was pertaining to. He just said that he needed his provider to give him a call.

## 2020-03-23 DIAGNOSIS — J449 Chronic obstructive pulmonary disease, unspecified: Secondary | ICD-10-CM | POA: Diagnosis not present

## 2020-03-27 ENCOUNTER — Ambulatory Visit: Payer: Self-pay | Admitting: *Deleted

## 2020-03-28 ENCOUNTER — Other Ambulatory Visit: Payer: Self-pay | Admitting: *Deleted

## 2020-03-28 NOTE — Patient Outreach (Signed)
[facility]) Care Management  03/28/2020  Kenneth [name] 1960/10/09 [id number]   CSW attempted a 2nd initial phone outreach call to pt on 03/27/2020 and was unable to reach.  CSW left a HIPPA compliant voice message and will attempt outreach again per policy; 3 attempts in 10 business days, if no return call is received.   [name], MSW, LCSW Clinical Social Worker  [facility] [employer] [phone]

## 2020-03-30 ENCOUNTER — Ambulatory Visit: Payer: Self-pay | Admitting: *Deleted

## 2020-04-01 ENCOUNTER — Other Ambulatory Visit: Payer: Self-pay | Admitting: Family Medicine

## 2020-04-01 DIAGNOSIS — E119 Type 2 diabetes mellitus without complications: Secondary | ICD-10-CM

## 2020-04-02 ENCOUNTER — Other Ambulatory Visit: Payer: Self-pay | Admitting: Family Medicine

## 2020-04-02 ENCOUNTER — Other Ambulatory Visit: Payer: Self-pay

## 2020-04-02 ENCOUNTER — Encounter: Payer: Self-pay | Admitting: Physician Assistant

## 2020-04-02 MED ORDER — TRELEGY ELLIPTA 100-62.5-25 MCG/INH IN AEPB: 1.0000 | INHALATION_SPRAY | Freq: Every day | RESPIRATORY_TRACT | 5 refills | Status: DC

## 2020-04-02 MED ORDER — ONETOUCH ULTRA 2 W/DEVICE KIT: PACK | 0 refills | Status: AC

## 2020-04-04 ENCOUNTER — Other Ambulatory Visit: Payer: Self-pay | Admitting: *Deleted

## 2020-04-04 ENCOUNTER — Ambulatory Visit: Payer: Self-pay | Admitting: *Deleted

## 2020-04-04 NOTE — Patient Outreach (Signed)
[facility]) Care Management  04/04/2020  Amay Mijangos Dec 16, 1960 [id number]  CSW attempted a 3rd outreach call to pt and was unsuccessful.   CSW will make [facility] RNCM aware and plan a follow up call/attempt again per protocol.   [name], MSW, LCSW Clinical Social Worker  [facility] [employer] [phone]

## 2020-04-06 ENCOUNTER — Other Ambulatory Visit: Payer: Self-pay

## 2020-04-06 ENCOUNTER — Inpatient Hospital Stay (HOSPITAL_COMMUNITY)
Admission: EM | Admit: 2020-04-06 | Discharge: 2020-04-08 | DRG: 291 | Disposition: A | Discharge status: Home / Self Care | Payer: Medicare Other | Attending: Internal Medicine | Admitting: Internal Medicine

## 2020-04-06 ENCOUNTER — Emergency Department (HOSPITAL_COMMUNITY): Payer: Medicare Other

## 2020-04-06 DIAGNOSIS — I130 Hypertensive heart and chronic kidney disease with heart failure and stage 1 through stage 4 chronic kidney disease, or unspecified chronic kidney disease: Principal | ICD-10-CM | POA: Diagnosis present

## 2020-04-06 DIAGNOSIS — Z7982 Long term (current) use of aspirin: Secondary | ICD-10-CM | POA: Diagnosis not present

## 2020-04-06 DIAGNOSIS — Z833 Family history of diabetes mellitus: Secondary | ICD-10-CM | POA: Diagnosis not present

## 2020-04-06 DIAGNOSIS — Z79899 Other long term (current) drug therapy: Secondary | ICD-10-CM | POA: Diagnosis not present

## 2020-04-06 DIAGNOSIS — K219 Gastro-esophageal reflux disease without esophagitis: Secondary | ICD-10-CM | POA: Diagnosis not present

## 2020-04-06 DIAGNOSIS — E1159 Type 2 diabetes mellitus with other circulatory complications: Secondary | ICD-10-CM | POA: Diagnosis present

## 2020-04-06 DIAGNOSIS — I10 Essential (primary) hypertension: Secondary | ICD-10-CM

## 2020-04-06 DIAGNOSIS — Z720 Tobacco use: Secondary | ICD-10-CM | POA: Diagnosis present

## 2020-04-06 DIAGNOSIS — Z7901 Long term (current) use of anticoagulants: Secondary | ICD-10-CM

## 2020-04-06 DIAGNOSIS — I480 Paroxysmal atrial fibrillation: Secondary | ICD-10-CM | POA: Diagnosis not present

## 2020-04-06 DIAGNOSIS — Z743 Need for continuous supervision: Secondary | ICD-10-CM | POA: Diagnosis not present

## 2020-04-06 DIAGNOSIS — Z7984 Long term (current) use of oral hypoglycemic drugs: Secondary | ICD-10-CM

## 2020-04-06 DIAGNOSIS — E119 Type 2 diabetes mellitus without complications: Secondary | ICD-10-CM

## 2020-04-06 DIAGNOSIS — E785 Hyperlipidemia, unspecified: Secondary | ICD-10-CM | POA: Diagnosis present

## 2020-04-06 DIAGNOSIS — Z8249 Family history of ischemic heart disease and other diseases of the circulatory system: Secondary | ICD-10-CM | POA: Diagnosis not present

## 2020-04-06 DIAGNOSIS — I499 Cardiac arrhythmia, unspecified: Secondary | ICD-10-CM | POA: Diagnosis not present

## 2020-04-06 DIAGNOSIS — I5043 Acute on chronic combined systolic (congestive) and diastolic (congestive) heart failure: Secondary | ICD-10-CM | POA: Diagnosis not present

## 2020-04-06 DIAGNOSIS — N1832 Chronic kidney disease, stage 3b: Secondary | ICD-10-CM | POA: Diagnosis present

## 2020-04-06 DIAGNOSIS — N179 Acute kidney failure, unspecified: Secondary | ICD-10-CM | POA: Diagnosis present

## 2020-04-06 DIAGNOSIS — Z7951 Long term (current) use of inhaled steroids: Secondary | ICD-10-CM

## 2020-04-06 DIAGNOSIS — F17210 Nicotine dependence, cigarettes, uncomplicated: Secondary | ICD-10-CM | POA: Diagnosis present

## 2020-04-06 DIAGNOSIS — Z20822 Contact with and (suspected) exposure to covid-19: Secondary | ICD-10-CM | POA: Diagnosis present

## 2020-04-06 DIAGNOSIS — E118 Type 2 diabetes mellitus with unspecified complications: Secondary | ICD-10-CM | POA: Diagnosis not present

## 2020-04-06 DIAGNOSIS — I5042 Chronic combined systolic (congestive) and diastolic (congestive) heart failure: Secondary | ICD-10-CM

## 2020-04-06 DIAGNOSIS — J9601 Acute respiratory failure with hypoxia: Secondary | ICD-10-CM | POA: Diagnosis not present

## 2020-04-06 DIAGNOSIS — R0689 Other abnormalities of breathing: Secondary | ICD-10-CM | POA: Diagnosis not present

## 2020-04-06 DIAGNOSIS — E1122 Type 2 diabetes mellitus with diabetic chronic kidney disease: Secondary | ICD-10-CM | POA: Diagnosis not present

## 2020-04-06 DIAGNOSIS — I152 Hypertension secondary to endocrine disorders: Secondary | ICD-10-CM | POA: Diagnosis present

## 2020-04-06 DIAGNOSIS — E1052 Type 1 diabetes mellitus with diabetic peripheral angiopathy with gangrene: Secondary | ICD-10-CM

## 2020-04-06 DIAGNOSIS — F329 Major depressive disorder, single episode, unspecified: Secondary | ICD-10-CM | POA: Diagnosis present

## 2020-04-06 DIAGNOSIS — G4733 Obstructive sleep apnea (adult) (pediatric): Secondary | ICD-10-CM | POA: Diagnosis present

## 2020-04-06 DIAGNOSIS — J441 Chronic obstructive pulmonary disease with (acute) exacerbation: Secondary | ICD-10-CM | POA: Diagnosis not present

## 2020-04-06 DIAGNOSIS — R0602 Shortness of breath: Secondary | ICD-10-CM | POA: Diagnosis not present

## 2020-04-06 DIAGNOSIS — I110 Hypertensive heart disease with heart failure: Secondary | ICD-10-CM | POA: Diagnosis not present

## 2020-04-06 DIAGNOSIS — I5021 Acute systolic (congestive) heart failure: Secondary | ICD-10-CM | POA: Diagnosis not present

## 2020-04-06 DIAGNOSIS — R52 Pain, unspecified: Secondary | ICD-10-CM | POA: Diagnosis not present

## 2020-04-06 DIAGNOSIS — I509 Heart failure, unspecified: Secondary | ICD-10-CM

## 2020-04-06 LAB — COMPREHENSIVE METABOLIC PANEL
ALT: 17 U/L (ref 0–44)
AST: 26 U/L (ref 15–41)
Albumin: 3.6 g/dL (ref 3.5–5.0)
Alkaline Phosphatase: 74 U/L (ref 38–126)
Anion gap: 11 (ref 5–15)
BUN: 5 mg/dL — ABNORMAL LOW (ref 6–20)
CO2: 27 mmol/L (ref 22–32)
Calcium: 8.9 mg/dL (ref 8.9–10.3)
Chloride: 100 mmol/L (ref 98–111)
Creatinine, Ser: 1.38 mg/dL — ABNORMAL HIGH (ref 0.61–1.24)
GFR calc Af Amer: >60 mL/min (ref >60)
GFR calc non Af Amer: 56 mL/min — ABNORMAL LOW (ref >60)
Glucose, Bld: 205 mg/dL — ABNORMAL HIGH (ref 70–99)
Potassium: 3.8 mmol/L (ref 3.5–5.1)
Sodium: 138 mmol/L (ref 135–145)
Total Bilirubin: <0.1 mg/dL — ABNORMAL LOW (ref 0.3–1.2)
Total Protein: 6.2 g/dL — ABNORMAL LOW (ref 6.5–8.1)

## 2020-04-06 LAB — CBC WITH DIFFERENTIAL/PLATELET
Abs Immature Granulocytes: 0.05 10*3/uL (ref 0.00–0.07)
Basophils Absolute: 0.1 10*3/uL (ref 0.0–0.1)
Basophils Relative: 1 %
Eosinophils Absolute: 0.1 10*3/uL (ref 0.0–0.5)
Eosinophils Relative: 2 %
HCT: 42.6 % (ref 39.0–52.0)
Hemoglobin: 13.2 g/dL (ref 13.0–17.0)
Immature Granulocytes: 1 %
Lymphocytes Relative: 34 %
Lymphs Abs: 2.1 10*3/uL (ref 0.7–4.0)
MCH: 30.5 pg (ref 26.0–34.0)
MCHC: 31 g/dL (ref 30.0–36.0)
MCV: 98.4 fL (ref 80.0–100.0)
Monocytes Absolute: 1.1 10*3/uL — ABNORMAL HIGH (ref 0.1–1.0)
Monocytes Relative: 18 %
Neutro Abs: 2.8 10*3/uL (ref 1.7–7.7)
Neutrophils Relative %: 44 %
Platelets: 336 10*3/uL (ref 150–400)
RBC: 4.33 MIL/uL (ref 4.22–5.81)
RDW: 12.4 % (ref 11.5–15.5)
WBC: 6.2 10*3/uL (ref 4.0–10.5)
nRBC: 0 % (ref 0.0–0.2)

## 2020-04-06 LAB — I-STAT VENOUS BLOOD GAS, ED
Acid-Base Excess: 3 mmol/L — ABNORMAL HIGH (ref 0.0–2.0)
Bicarbonate: 31.8 mmol/L — ABNORMAL HIGH (ref 20.0–28.0)
Calcium, Ion: 1.18 mmol/L (ref 1.15–1.40)
HCT: 43 % (ref 39.0–52.0)
Hemoglobin: 14.6 g/dL (ref 13.0–17.0)
O2 Saturation: 93 %
Potassium: 3.9 mmol/L (ref 3.5–5.1)
Sodium: 138 mmol/L (ref 135–145)
TCO2: 34 mmol/L — ABNORMAL HIGH (ref 22–32)
pCO2, Ven: 68.8 mmHg — ABNORMAL HIGH (ref 44.0–60.0)
pH, Ven: 7.273 (ref 7.250–7.430)
pO2, Ven: 78 mmHg — ABNORMAL HIGH (ref 32.0–45.0)

## 2020-04-06 LAB — SARS CORONAVIRUS 2 BY RT PCR (HOSPITAL ORDER, PERFORMED IN ~~LOC~~ HOSPITAL LAB): SARS Coronavirus 2: NEGATIVE

## 2020-04-06 LAB — BRAIN NATRIURETIC PEPTIDE: B Natriuretic Peptide: 1472 pg/mL — ABNORMAL HIGH (ref 0.0–100.0)

## 2020-04-06 MED ORDER — IPRATROPIUM-ALBUTEROL 0.5-2.5 (3) MG/3ML IN SOLN: 3.0000 mL | Freq: Four times a day (QID) | RESPIRATORY_TRACT | Status: DC

## 2020-04-06 MED ORDER — ALBUTEROL SULFATE (2.5 MG/3ML) 0.083% IN NEBU: 2.5000 mg | INHALATION_SOLUTION | RESPIRATORY_TRACT | Status: DC | PRN

## 2020-04-06 MED ORDER — INSULIN ASPART 100 UNIT/ML ~~LOC~~ SOLN
0.0000 [IU] | Freq: Three times a day (TID) | SUBCUTANEOUS | Status: DC
  Administered 2020-04-07: 5 [IU] via SUBCUTANEOUS
  Administered 2020-04-07: 2 [IU] via SUBCUTANEOUS

## 2020-04-06 MED ORDER — METHYLPREDNISOLONE SODIUM SUCC 40 MG IJ SOLR
40.0000 mg | Freq: Every day | INTRAMUSCULAR | Status: DC
  Administered 2020-04-07: 40 mg via INTRAVENOUS
  Filled 2020-04-06: qty 1

## 2020-04-06 MED ORDER — IPRATROPIUM BROMIDE 0.02 % IN SOLN
0.5000 mg | Freq: Once | RESPIRATORY_TRACT | Status: AC
  Administered 2020-04-06: 0.5 mg via RESPIRATORY_TRACT
  Filled 2020-04-06: qty 2.5

## 2020-04-06 MED ORDER — FUROSEMIDE 10 MG/ML IJ SOLN
40.0000 mg | Freq: Every day | INTRAMUSCULAR | Status: DC
  Administered 2020-04-07: 40 mg via INTRAVENOUS
  Filled 2020-04-06: qty 4

## 2020-04-06 MED ORDER — ALBUTEROL SULFATE (2.5 MG/3ML) 0.083% IN NEBU
5.0000 mg | INHALATION_SOLUTION | Freq: Once | RESPIRATORY_TRACT | Status: AC
  Administered 2020-04-06: 5 mg via RESPIRATORY_TRACT
  Filled 2020-04-06: qty 6

## 2020-04-06 MED ORDER — FUROSEMIDE 10 MG/ML IJ SOLN
60.0000 mg | Freq: Once | INTRAMUSCULAR | Status: AC
  Administered 2020-04-06: 60 mg via INTRAVENOUS
  Filled 2020-04-06: qty 6

## 2020-04-06 NOTE — ED Notes (Signed)
Pt packaged and placed on a transport monitor. Ready to be transported.

## 2020-04-06 NOTE — ED Notes (Signed)
This RN providing other patient care at time 2W nurse called for report, this RN called 2W unable to reach RN at this time. Contact number left for call back report.

## 2020-04-06 NOTE — Progress Notes (Signed)
Patient trialed off BIPAP at this time. Patient currently tolerating RA with stable vital signs.

## 2020-04-06 NOTE — ED Notes (Signed)
Pt tolerating biPAP well, pt resting comfortably in bed with eyes closed, no acute respiratory distress noted. Urinal placed in accessible reach for elimination needs.

## 2020-04-06 NOTE — ED Triage Notes (Signed)
Pt presented to ED from home via EMS co of SOB following panic attack. Per EMS pt hx COPD, SPO2 RA 60s,improved with BiPAP 90s. Pt received via IV 125 Solumedrol took 2.25 Albuterol duoneb at home. Rhonchi accessed bilaterally.

## 2020-04-06 NOTE — ED Notes (Signed)
Pt up at bedside sink requesting to wash up, pt provided wash linens by this RN, pt stable, ambulatory with steady gait, pt SPO2 96% with ambulation and exertion, respirations even and unlabored, no acute respiratory distress noted at this time.

## 2020-04-06 NOTE — ED Provider Notes (Signed)
[facility] EMERGENCY DEPARTMENT Provider Note   CSN: [id number] Arrival date & time: 04/06/20  1955     History Chief Complaint  Patient presents with  . Shortness of Breath    Kenneth [name] is a 59 y.o. male.  Patient is a 59 year old male with history of atrial fibrillation, CHF, diabetes, hypertension, hyperlipidemia and COPD who presents today for respiratory distress.  It was reported by EMS that patient started having worsening shortness of breath this afternoon and family called because they thought he was having a panic attack.  When fire arrived patient's oxygen saturation was 60% on room air he was diaphoretic, tachypneic and cool to the touch.  When EMS arrived they placed him on a nonrebreather which improved saturation to 80%.  He was distressed and not breathing well and he was then placed on BiPAP.  They reported BiPAP improved his breathing significantly but he is still tachypneic.  Patient did receive 125 of Solu-Medrol, 2 g of magnesium but did not receive albuterol or Atrovent in route because they did not hear significant wheezing.  Patient denies any swelling in his legs and has no chest pain at this time but continues to feel short of breath.  He states this feels like when he is come to the hospital in the past for COPD.  The history is provided by the patient.  Shortness of Breath Severity:  Severe Onset quality:  Sudden Duration:  1 day Timing:  Constant Progression:  Worsening Chronicity:  Recurrent Context: not URI   Relieved by:  Nothing Worsened by:  Activity and coughing Ineffective treatments:  None tried Associated symptoms: cough   Associated symptoms: no chest pain, no fever, no sputum production and no vomiting        Past Medical History:  Diagnosis Date  . Arthritis   . Asthma   . Atrial fibrillation ([place])   . CHF (congestive heart failure) ([place])   . Depression   . Diabetes mellitus without complication ([place])   .  Emphysema of lung ([place])   . Heart murmur   . Hyperlipidemia   . Hypertension     Patient Active Problem List   Diagnosis Date Noted  . Acute on chronic respiratory failure with hypoxemia ([place]) 02/08/2020  . Chronic constipation 07/19/2019  . Moderate persistent asthma with acute exacerbation 06/17/2019  . Hospital discharge follow-up 06/17/2019  . Shortness of breath 06/12/2019  . Chest tightness 06/12/2019  . Snoring 03/29/2019  . Insomnia due to other mental disorder 11/15/2018  . Anticoagulant long-term use 10/21/2018  . Gastroesophageal reflux disease without esophagitis 08/10/2018  . Tobacco abuse 08/10/2018  . Chest pain 07/27/2018  . PAF (paroxysmal atrial fibrillation) ([place]) 07/27/2018  . Mild renal insufficiency 07/27/2018  . Chronic combined systolic and diastolic heart failure ([place]) 07/27/2018  . Slow transit constipation 07/20/2018  . Essential hypertension 07/06/2018  . Chronic obstructive pulmonary disease ([place]) 07/06/2018  . Controlled type 2 diabetes mellitus without complication, without long-term current use of insulin ([place]) 07/06/2018  . Depression with anxiety 07/06/2018  . Anemia 07/06/2018  . Healthcare maintenance 07/06/2018  . Alcohol abuse 07/06/2018    No past surgical history on file.     Family History  Problem Relation Age of Onset  . Diabetes Sister   . Coronary artery disease Brother     Social History   Tobacco Use  . Smoking status: Current Some Day Smoker    Packs/day: 2.00    Years: 30.00  Pack years: 60.00    Types: Cigarettes  . Smokeless tobacco: Never Used  . Tobacco comment: smokes 2-3cigs per day  Substance Use Topics  . Alcohol use: Yes    Comment: Drinks up to six beers around a game  . Drug use: Yes    Types: Marijuana    Comment: occ marijuana    Home Medications Prior to Admission medications   Medication Sig Start Date End Date Taking? Authorizing Provider  albuterol (PROVENTIL HFA;VENTOLIN HFA) 108 (90  Base) MCG/ACT inhaler Inhale 1-2 puffs into the lungs every 4 (four) hours as needed for wheezing or shortness of breath.  06/19/16   [provider]  albuterol (PROVENTIL) (2.5 MG/3ML) 0.083% nebulizer solution Take 3 mLs (2.5 mg total) by nebulization 5 (five) times daily as needed for wheezing or shortness of breath. 03/06/20   Mliss Sax, MD  amLODipine (NORVASC) 10 MG tablet Take 1 tablet (10 mg total) by mouth daily. 07/19/19   Everrett Coombe, DO  amLODipine (NORVASC) 5 MG tablet TAKE 1 TABLET BY MOUTH  DAILY 04/02/20   Mliss Sax, MD  apixaban (ELIQUIS) 5 MG TABS tablet Take 1 tablet (5 mg total) by mouth 2 (two) times daily. 03/20/20   Jodelle Gross, NP  aspirin EC 81 MG tablet Take 81 mg by mouth daily.    [provider]  Blood Glucose Monitoring Suppl (ONE TOUCH ULTRA 2) w/Device KIT Use to test blood sugars 1-2 times daily. 04/02/20   Mliss Sax, MD  busPIRone (BUSPAR) 15 MG tablet Take 1 tablet (15 mg total) by mouth 2 (two) times daily. 09/06/19   Mliss Sax, MD  carvedilol (COREG) 12.5 MG tablet Take 1 tablet (12.5 mg total) by mouth 2 (two) times daily with a meal. 03/20/20   Jodelle Gross, NP  famotidine (PEPCID) 20 MG tablet TAKE 1 TABLET BY MOUTH  DAILY 03/23/20   Mliss Sax, MD  FLUoxetine (PROZAC) 20 MG capsule TAKE 3 CAPSULES BY MOUTH  DAILY Patient taking differently: Take 60 mg by mouth daily.  05/26/19   Mliss Sax, MD  Fluticasone-Umeclidin-Vilant (TRELEGY ELLIPTA) 100-62.5-25 MCG/INH AEPB Inhale 1 puff into the lungs daily. 04/02/20   Leslye Peer, MD  furosemide (LASIX) 20 MG tablet TAKE 1 TABLET BY MOUTH  DAILY 04/02/20   Mliss Sax, MD  glucose blood [facility] ULTRA) test strip Use to test blood sugars 1-2 times daily. 05/20/19   Mliss Sax, MD  ipratropium (ATROVENT) 0.02 % nebulizer solution Take 2.5 mLs (0.5 mg total) by nebulization 4 (four) times daily.  08/05/19   Law, Waylan Boga, PA-C  isosorbide mononitrate (IMDUR) 30 MG 24 hr tablet TAKE 1 TABLET BY MOUTH EVERY DAY 04/02/20   Mliss Sax, MD  Lancets [facility] ULTRASOFT) lancets Use to test blood sugars 1-2 times daily. 05/20/19   Mliss Sax, MD  losartan (COZAAR) 25 MG tablet Take 1 tablet (25 mg total) by mouth daily. Please schedule annual appt with Dr. Cristal Deer for refills. [phone]. 3rd attempt. Patient taking differently: Take 25 mg by mouth daily.  01/31/20   Jodelle Red, MD  lubiprostone ([name]) 8 MCG capsule Take 1 capsule (8 mcg total) by mouth 2 (two) times daily with a meal. 03/22/20   Nche, Bonna Gains, NP  metFORMIN (GLUCOPHAGE) 500 MG tablet TAKE 1 TABLET BY MOUTH  TWICE DAILY WITH MEALS 04/02/20   Mliss Sax, MD  Multiple Vitamin (MULTIVITAMIN WITH MINERALS) TABS tablet Take  1 tablet by mouth daily.    [provider]  nitroGLYCERIN (NITROSTAT) 0.4 MG SL tablet DISSOLVE 1 TABLET UNDER THE TONGUE EVERY 5 MINUTES AS  NEEDED FOR CHEST PAIN. MAX  OF 3 TABLETS IN 15 MINUTES. CALL 911 IF PAIN PERSISTS. Patient taking differently: Place 0.4 mg under the tongue every 5 (five) minutes as needed for chest pain.  06/29/19   [name], MD  pantoprazole (PROTONIX) 20 MG tablet Take 1 tablet (20 mg total) by mouth daily. 03/20/20   Lendon Colonel, NP  traZODone (DESYREL) 50 MG tablet TAKE 1/2 TO 1 TABLET BY  MOUTH AT BEDTIME AS NEEDED  FOR SLEEP. Patient taking differently: Take 50 mg by mouth at bedtime as needed for sleep.  05/26/19   [name], MD    Allergies    Lisinopril, Other, and Tomato  Review of Systems   Review of Systems  Constitutional: Negative for fever.  Respiratory: Positive for cough and shortness of breath. Negative for sputum production.   Cardiovascular: Negative for chest pain.  Gastrointestinal: Negative for vomiting.  All other systems reviewed and are negative.   Physical  Exam Updated Vital Signs BP (!) 168/109 (BP Location: Right Arm)   Pulse 96   Temp 98.3 F (36.8 C) (Axillary)   Resp 17   Ht 6' (1.829 m)   Wt 76.9 kg   SpO2 100% Comment: BiPAP  BMI 22.99 kg/m   Physical Exam Vitals and nursing note reviewed.  Constitutional:      General: He is in acute distress.     Appearance: He is well-developed and normal weight. He is diaphoretic.  HENT:     Head: Normocephalic and atraumatic.     Mouth/Throat:     Mouth: Mucous membranes are moist.  Eyes:     Conjunctiva/sclera: Conjunctivae normal.     Pupils: Pupils are equal, round, and reactive to light.  Cardiovascular:     Rate and Rhythm: Regular rhythm. Tachycardia present.     Pulses: Normal pulses.     Heart sounds: No murmur heard.   Pulmonary:     Effort: Pulmonary effort is normal. No respiratory distress.     Breath sounds: Normal breath sounds. No wheezing or rales.     Comments: Rales and rhonchi present  Diffusely but more pronounced in lower lobes and poor air entry Abdominal:     General: There is no distension.     Palpations: Abdomen is soft.     Tenderness: There is no abdominal tenderness. There is no guarding or rebound.  Musculoskeletal:        General: No tenderness. Normal range of motion.     Cervical back: Normal range of motion and neck supple.     Right lower leg: No tenderness. No edema.     Left lower leg: No tenderness. No edema.  Skin:    Coloration: Skin is pale.     Findings: No erythema or rash.     Comments: cool  Neurological:     General: No focal deficit present.     Mental Status: He is alert and oriented to person, place, and time.  Psychiatric:        Mood and Affect: Mood normal.        Behavior: Behavior normal.     ED Results / Procedures / Treatments   Labs (all labs ordered are listed, but only abnormal results are displayed) Labs Reviewed  CBC WITH DIFFERENTIAL/PLATELET - Abnormal; Notable  for the following components:       Result Value   Monocytes Absolute 1.1 (*)    All other components within normal limits  COMPREHENSIVE METABOLIC PANEL - Abnormal; Notable for the following components:   Glucose, Bld 205 (*)    BUN 5 (*)    Creatinine, Ser 1.38 (*)    Total Protein 6.2 (*)    Total Bilirubin <0.1 (*)    GFR calc non Af Amer 56 (*)    All other components within normal limits  BRAIN NATRIURETIC PEPTIDE - Abnormal; Notable for the following components:   B Natriuretic Peptide 1,472.0 (*)    All other components within normal limits  I-STAT VENOUS BLOOD GAS, ED - Abnormal; Notable for the following components:   pCO2, Ven 68.8 (*)    pO2, Ven 78.0 (*)    Bicarbonate 31.8 (*)    TCO2 34 (*)    Acid-Base Excess 3.0 (*)    All other components within normal limits  SARS CORONAVIRUS 2 BY RT PCR [facility] ORDER, [place] [facility])    EKG EKG Interpretation  Date/Time:  Friday April 06 2020 20:02:13 EDT Ventricular Rate:  98 PR Interval:    QRS Duration: 103 QT Interval:  378 QTC Calculation: 483 R Axis:   79 Text Interpretation: Sinus rhythm Borderline prolonged PR interval Nonspecific repol abnormality, diffuse leads Borderline ST elevation, anterior leads No significant change since last tracing Confirmed by Blanchie Dessert ([id number]) on 04/06/2020 8:40:28 PM   Radiology DG Chest Port 1 View  Result Date: 04/06/2020 CLINICAL DATA:  Shortness of breath. EXAM: PORTABLE CHEST 1 VIEW COMPARISON:  March 08, 2020. FINDINGS: The heart size and mediastinal contours are within normal limits. Both lungs are clear. No pneumothorax or pleural effusion is noted. The visualized skeletal structures are unremarkable. IMPRESSION: No active disease. Aortic Atherosclerosis (ICD10-I70.0). Electronically Signed   By: Marijo Conception M.D.   On: 04/06/2020 20:21    Procedures Procedures (including critical care time)  Medications Ordered in ED Medications  albuterol (PROVENTIL) (2.5 MG/3ML) 0.083%  nebulizer solution 5 mg (5 mg Nebulization Given 04/06/20 2010)  ipratropium (ATROVENT) nebulizer solution 0.5 mg (0.5 mg Nebulization Given 04/06/20 2010)    ED Course  I have reviewed the triage vital signs and the nursing notes.  Pertinent labs & imaging results that were available during my care of the patient were reviewed by me and considered in my medical decision making (see chart for details).    MDM Rules/Calculators/A&P                          59 year old male with multiple medical problems arriving today with respiratory distress on CPAP.  Patient upon arrival here is still diaphoretic and cool to the touch but is able to speak in short winded sentences.  Patient has bilateral rhonchi and crackles with tachypnea and poor air entry.  Upon arrival here patient's oxygen saturation in the high 90s with ongoing tachycardia.  He was transferred to BiPAP with improvement in symptoms.  Patient did receive Solu-Medrol and magnesium in route and was given albuterol and Atrovent here.  He has had the Covid vaccine and denies any URI symptoms prior to his symptoms starting.  Patient has no evidence for fluid overload such as swelling in his legs or JVD.  Suspect this is COPD exacerbation.  Patient given albuterol and Atrovent.  Chest x-ray is clear.  EKG without acute  changes.  CBC without acute findings, VBG with normal pH of 7.27 with CO2 of 68 and bicarb of 31 with compensated respiratory acidosis.  This is most likely chronic.  CMP, BNP are pending and will reevaluate after breathing treatments.  Low suspicion for Covid but Covid test is pending.  Suspect patient will need admission for COPD exacerbation.  Low suspicion for PE at this time as patient is already on Eliquis.  10:52 PM Patient CMP with stable renal function but BNP is elevated to 1400 and last was 800.  Patient may be having a combination of COPD and CHF exacerbation.  He was given an IV dose of 60 mg of Lasix.  He appears  comfortable on BiPAP at this time.  Once patient starts urinating we will attempt to wean him from BiPAP.  Patient admitted for further care.  CRITICAL CARE Performed by: Lateef Juncaj Total critical care time: 30 minutes Critical care time was exclusive of separately billable procedures and treating other patients. Critical care was necessary to treat or prevent imminent or life-threatening deterioration. Critical care was time spent personally by me on the following activities: development of treatment plan with patient and/or surrogate as well as nursing, discussions with consultants, evaluation of patient's response to treatment, examination of patient, obtaining history from patient or surrogate, ordering and performing treatments and interventions, ordering and review of laboratory studies, ordering and review of radiographic studies, pulse oximetry and re-evaluation of patient's condition.  MDM Number of Diagnoses or Management Options   Amount and/or Complexity of Data Reviewed Clinical lab tests: ordered and reviewed Tests in the radiology section of CPT: ordered and reviewed Tests in the medicine section of CPT: ordered and reviewed Decide to obtain previous medical records or to obtain history from someone other than the patient: yes Obtain history from someone other than the patient: yes Review and summarize past medical records: yes Discuss the patient with other providers: yes Independent visualization of images, tracings, or specimens: yes  Risk of Complications, Morbidity, and/or Mortality Presenting problems: high Diagnostic procedures: moderate Management options: moderate  Patient Progress Patient progress: improved   Final Clinical Impression(s) / ED Diagnoses Final diagnoses:  COPD exacerbation ([place])  Acute on chronic congestive heart failure, unspecified heart failure type ([place])  Acute respiratory failure with hypoxia [facility] [address])    Rx / DC Orders ED  Discharge Orders    None       Blanchie Dessert, MD 04/06/20 2254

## 2020-04-06 NOTE — H&P (Signed)
History and Physical    Kenneth [name] TAV:[id number] DOB: Jan 29, 1961 DOA: 04/06/2020  PCP: [name], MD  Patient coming from: Home, lives with girlfriend  I have personally briefly reviewed patient's old medical records in [place]  Chief Complaint: Acute shortness of breath  HPI: Kenneth [name] is a 59 y.o. male with medical history significant for Paroxysmal atrial fibrillation on Eliquis, combined systolic and diastolic heart failure, COPD, type 2 diabetes, CKD stage III, hypertension, hyperlipidemia and OSA on CPAP who presents with acute worsening shortness of breath.  Patient reports that he awoke from a nap today and felt like he could not breathe and took his breathing treatment which did not help. He also began to have anxiety and a panic attack so his girlfriend called EMS. He was otherwise in his normal state of health. He is on CPAP for OSA which he is compliant with at night but was not wearing it during his nap today. He denies any increasing cough or sputum production. No fevers. No chest pain. He normally sleeps with a few pillows but denies any new orthopnea or PND. No lower extremity edema. He has been compliant with his Lasix and Eliquis. He is in the process of trying to qualify for nighttime oxygen with pulmonology. Also reports that he has been trying to cut down on his tobacco use.  On EMS arrival, patient was found to be hypoxic down to 60% on room air.  Initially placed on nonrebreather with improvement up to 60%.  He was then placed on BiPAP in the ED. VBG shows pH of 7.273, CO2 of 68, bicarb of 31. BNP elevated to 1472 No leukocytosis or anemia.  Creatinine elevated to 1.38 from a prior of 1.22.  Chest x-ray negative.  He was given Solu-Medrol and magnesium on route by EMS. He is received 60 mg of Lasix and DuoNeb in the ED.  Review of Systems:  Constitutional: No Weight Change, No Fever ENT/Mouth: No sore throat, No Rhinorrhea Eyes: No Eye  Pain, No Vision Changes Cardiovascular: No Chest Pain, +SOB Respiratory: + Cough, No Sputum, No Wheezing, no Dyspnea  Gastrointestinal: No Nausea, No Vomiting, No Diarrhea, No Constipation, No Pain Genitourinary: no Urinary Incontinence, No Urgency, No Flank Pain Musculoskeletal: No Arthralgias, No Myalgias Skin: No Skin Lesions, No Pruritus, Neuro: no Weakness, No Numbness Psych: No Anxiety/Panic, No Depression, no decrease appetite Heme/Lymph: No Bruising, No Bleeding   Past Medical History:  Diagnosis Date   Arthritis    Asthma    Atrial fibrillation (HCC)    CHF (congestive heart failure) (HCC)    Depression    Diabetes mellitus without complication (HCC)    Emphysema of lung (HCC)    Heart murmur    Hyperlipidemia    Hypertension       reports that he has been smoking cigarettes. He has a 60.00 pack-year smoking history. He has never used smokeless tobacco. He reports current alcohol use. He reports current drug use. Drug: Marijuana.  Allergies  Allergen Reactions   Lisinopril Swelling   Other Other (See Comments)    Lettuce : rash   Tomato Rash    Family History  Problem Relation Age of Onset   Diabetes Sister    Coronary artery disease Brother      Prior to Admission medications   Medication Sig Start Date End Date Taking? Authorizing Provider  albuterol (PROVENTIL HFA;VENTOLIN HFA) 108 (90 Base) MCG/ACT inhaler Inhale 1-2 puffs into the lungs every 4 (four)  hours as needed for wheezing or shortness of breath.  06/19/16   [provider]  albuterol (PROVENTIL) (2.5 MG/3ML) 0.083% nebulizer solution Take 3 mLs (2.5 mg total) by nebulization 5 (five) times daily as needed for wheezing or shortness of breath. 03/06/20   [name], MD  amLODipine (NORVASC) 10 MG tablet Take 1 tablet (10 mg total) by mouth daily. 07/19/19   Luetta Nutting, DO  amLODipine (NORVASC) 5 MG tablet TAKE 1 TABLET BY MOUTH  DAILY 04/02/20   [name], MD  apixaban (ELIQUIS) 5 MG TABS tablet Take 1 tablet (5 mg total) by mouth 2 (two) times daily. 03/20/20   Lendon Colonel, NP  aspirin EC 81 MG tablet Take 81 mg by mouth daily.    [provider]  Blood Glucose Monitoring Suppl (ONE TOUCH ULTRA 2) w/Device KIT Use to test blood sugars 1-2 times daily. 04/02/20   [name], MD  busPIRone (BUSPAR) 15 MG tablet Take 1 tablet (15 mg total) by mouth 2 (two) times daily. 09/06/19   [name], MD  carvedilol (COREG) 12.5 MG tablet Take 1 tablet (12.5 mg total) by mouth 2 (two) times daily with a meal. 03/20/20   Lendon Colonel, NP  famotidine (PEPCID) 20 MG tablet TAKE 1 TABLET BY MOUTH  DAILY 03/23/20   [name], MD  FLUoxetine (PROZAC) 20 MG capsule TAKE 3 CAPSULES BY MOUTH  DAILY Patient taking differently: Take 60 mg by mouth daily.  05/26/19   [name], MD  Fluticasone-Umeclidin-Vilant (TRELEGY ELLIPTA) 100-62.5-25 MCG/INH AEPB Inhale 1 puff into the lungs daily. 04/02/20   Collene Gobble, MD  furosemide (LASIX) 20 MG tablet TAKE 1 TABLET BY MOUTH  DAILY 04/02/20   [name], MD  glucose blood [facility] ULTRA) test strip Use to test blood sugars 1-2 times daily. 05/20/19   [name], MD  ipratropium (ATROVENT) 0.02 % nebulizer solution Take 2.5 mLs (0.5 mg total) by nebulization 4 (four) times daily. 08/05/19   Law, Bea Graff, PA-C  isosorbide mononitrate (IMDUR) 30 MG 24 hr tablet TAKE 1 TABLET BY MOUTH EVERY DAY 04/02/20   [name], MD  Lancets [facility] ULTRASOFT) lancets Use to test blood sugars 1-2 times daily. 05/20/19   [name], MD  losartan (COZAAR) 25 MG tablet Take 1 tablet (25 mg total) by mouth daily. Please schedule annual appt with Dr. Harrell Gave for refills. [phone]. 3rd attempt. Patient taking differently: Take 25 mg by mouth daily.  01/31/20   Buford Dresser, MD  lubiprostone ([name]) 8 MCG  capsule Take 1 capsule (8 mcg total) by mouth 2 (two) times daily with a meal. 03/22/20   Nche, Charlene Brooke, NP  metFORMIN (GLUCOPHAGE) 500 MG tablet TAKE 1 TABLET BY MOUTH  TWICE DAILY WITH MEALS 04/02/20   [name], MD  Multiple Vitamin (MULTIVITAMIN WITH MINERALS) TABS tablet Take 1 tablet by mouth daily.    [provider]  nitroGLYCERIN (NITROSTAT) 0.4 MG SL tablet DISSOLVE 1 TABLET UNDER THE TONGUE EVERY 5 MINUTES AS  NEEDED FOR CHEST PAIN. MAX  OF 3 TABLETS IN 15 MINUTES. CALL 911 IF PAIN PERSISTS. Patient taking differently: Place 0.4 mg under the tongue every 5 (five) minutes as needed for chest pain.  06/29/19   [name], MD  pantoprazole (PROTONIX) 20 MG tablet Take 1 tablet (20 mg total) by mouth daily. 03/20/20   Lendon Colonel, NP  traZODone (DESYREL) 50  MG tablet TAKE 1/2 TO 1 TABLET BY  MOUTH AT BEDTIME AS NEEDED  FOR SLEEP. Patient taking differently: Take 50 mg by mouth at bedtime as needed for sleep.  05/26/19   [name], MD    Physical Exam: Vitals:   04/06/20 2023 04/06/20 2100 04/06/20 2130 04/06/20 2230  BP:  (!) 136/93 134/87 136/84  Pulse:  96 96 86  Resp:  (!) 28 (!) 28 16  Temp:      TempSrc:      SpO2: 100% 100% 100% 100%  Weight:      Height:        Constitutional: NAD, calm, comfortable, nontoxic well-appearing male sitting upright on BiPAP Vitals:   04/06/20 2023 04/06/20 2100 04/06/20 2130 04/06/20 2230  BP:  (!) 136/93 134/87 136/84  Pulse:  96 96 86  Resp:  (!) 28 (!) 28 16  Temp:      TempSrc:      SpO2: 100% 100% 100% 100%  Weight:      Height:       Eyes: PERRL, lids and conjunctivae normal ENMT: Mucous membranes are moist. Neck: normal, supple, no masses, no thyromegaly Respiratory: Aeration throughout but no wheezing, no crackles. Normal respiratory effort on BiPAP and able to speak in full sentences. No accessory muscle use.  Cardiovascular: Regular rate and rhythm, no murmurs / rubs /  gallops. No extremity edema.   Abdomen: no tenderness, no masses palpated.Bowel sounds positive.  Musculoskeletal: no clubbing / cyanosis. No joint deformity upper and lower extremities. Good ROM, no contractures. Normal muscle tone.  Skin: no rashes, lesions, ulcers. No induration Neurologic: CN 2-12 grossly intact. Sensation intact, Strength 5/5 in all 4.  Psychiatric: Normal judgment and insight. Alert and oriented x 3. Normal mood.     Labs on Admission: I have personally reviewed following labs and imaging studies  CBC: Recent Labs  Lab 04/06/20 2011 04/06/20 2046  WBC 6.2  --   NEUTROABS 2.8  --   HGB 13.2 14.6  HCT 42.6 43.0  MCV 98.4  --   PLT 336  --    Basic Metabolic Panel: Recent Labs  Lab 04/06/20 2011 04/06/20 2046  NA 138 138  K 3.8 3.9  CL 100  --   CO2 27  --   GLUCOSE 205*  --   BUN 5*  --   CREATININE 1.38*  --   CALCIUM 8.9  --    GFR: Estimated Creatinine Clearance: 63.5 mL/min (A) (by C-G formula based on SCr of 1.38 mg/dL (H)). Liver Function Tests: Recent Labs  Lab 04/06/20 2011  AST 26  ALT 17  ALKPHOS 74  BILITOT <0.1*  PROT 6.2*  ALBUMIN 3.6   No results for input(s): LIPASE, AMYLASE in the last 168 hours. No results for input(s): AMMONIA in the last 168 hours. Coagulation Profile: No results for input(s): INR, PROTIME in the last 168 hours. Cardiac Enzymes: No results for input(s): CKTOTAL, CKMB, CKMBINDEX, TROPONINI in the last 168 hours. BNP (last 3 results) No results for input(s): PROBNP in the last 8760 hours. HbA1C: No results for input(s): HGBA1C in the last 72 hours. CBG: No results for input(s): GLUCAP in the last 168 hours. Lipid Profile: No results for input(s): CHOL, HDL, LDLCALC, TRIG, CHOLHDL, LDLDIRECT in the last 72 hours. Thyroid Function Tests: No results for input(s): TSH, T4TOTAL, FREET4, T3FREE, THYROIDAB in the last 72 hours. Anemia Panel: No results for input(s): VITAMINB12, FOLATE, FERRITIN, TIBC,  IRON, RETICCTPCT  in the last 72 hours. Urine analysis:    Component Value Date/Time   COLORURINE YELLOW 05/20/2019 0825   APPEARANCEUR CLEAR 05/20/2019 0825   LABSPEC 1.020 05/20/2019 0825   PHURINE 6.0 05/20/2019 0825   GLUCOSEU NEGATIVE 05/20/2019 0825   HGBUR NEGATIVE 05/20/2019 0825   BILIRUBINUR SMALL (A) 05/20/2019 0825   KETONESUR TRACE (A) 05/20/2019 0825   UROBILINOGEN 1.0 05/20/2019 0825   NITRITE NEGATIVE 05/20/2019 0825   LEUKOCYTESUR NEGATIVE 05/20/2019 0825    Radiological Exams on Admission: DG Chest Port 1 View  Result Date: 04/06/2020 CLINICAL DATA:  Shortness of breath. EXAM: PORTABLE CHEST 1 VIEW COMPARISON:  March 08, 2020. FINDINGS: The heart size and mediastinal contours are within normal limits. Both lungs are clear. No pneumothorax or pleural effusion is noted. The visualized skeletal structures are unremarkable. IMPRESSION: No active disease. Aortic Atherosclerosis (ICD10-I70.0). Electronically Signed   By: Marijo Conception M.D.   On: 04/06/2020 20:21      Assessment/Plan Acute hypoxic respiratory failure secondary to likely  Acute COPD exacerbation acute on chronic combined systolic and diastolic heart failure Scheduled duo-nebs. PRN albuterol.  Continue solu-medrol Flutter and incentive spirometry Last echo was 30-35% in 05/2019. Will repeat. Daily IV Lasix 45m Strict Is and Os Daily weights   AKI on CKD stage IIIb Hold on fluids for now due to acute CHF. Avoid nephrotoxic agent  Paroxysmal atrial fibrillation Continue Eliquis, metoprolol  Hypertension Continue amlodipine, metoprolol, Imdur Hold ARB due to AKI  Type 2 diabetes Moderate SSI  DVT prophylaxis:Eliquis Code Status: Full Family Communication: Plan discussed with patient at bedside  disposition Plan: Home with at least 2 midnight stays  Consults called:  Admission status: inpatient   Status is: Inpatient  Remains inpatient appropriate because:Inpatient level of care  appropriate due to severity of illness   Dispo: The patient is from: Home              Anticipated d/c is to: Home              Anticipated d/c date is: 3 days              Patient currently is not medically stable to d/c.         COrene DesanctisDO [facility]   If 7PM-7AM, please contact night-coverage [email or web address]   04/06/2020, 11:15 PM

## 2020-04-06 NOTE — ED Notes (Signed)
2W contacted for report, this RN left contact number for return call with Marylene Land

## 2020-04-07 ENCOUNTER — Inpatient Hospital Stay (HOSPITAL_COMMUNITY): Payer: Medicare Other

## 2020-04-07 ENCOUNTER — Encounter (HOSPITAL_COMMUNITY): Payer: Self-pay | Admitting: Family Medicine

## 2020-04-07 DIAGNOSIS — I5042 Chronic combined systolic (congestive) and diastolic (congestive) heart failure: Secondary | ICD-10-CM

## 2020-04-07 DIAGNOSIS — I5021 Acute systolic (congestive) heart failure: Secondary | ICD-10-CM

## 2020-04-07 DIAGNOSIS — I509 Heart failure, unspecified: Secondary | ICD-10-CM

## 2020-04-07 DIAGNOSIS — J441 Chronic obstructive pulmonary disease with (acute) exacerbation: Secondary | ICD-10-CM

## 2020-04-07 DIAGNOSIS — I5043 Acute on chronic combined systolic (congestive) and diastolic (congestive) heart failure: Secondary | ICD-10-CM

## 2020-04-07 LAB — CBC
HCT: 41.9 % (ref 39.0–52.0)
Hemoglobin: 13.6 g/dL (ref 13.0–17.0)
MCH: 31.2 pg (ref 26.0–34.0)
MCHC: 32.5 g/dL (ref 30.0–36.0)
MCV: 96.1 fL (ref 80.0–100.0)
Platelets: 284 10*3/uL (ref 150–400)
RBC: 4.36 MIL/uL (ref 4.22–5.81)
RDW: 12.5 % (ref 11.5–15.5)
WBC: 5 10*3/uL (ref 4.0–10.5)
nRBC: 0 % (ref 0.0–0.2)

## 2020-04-07 LAB — ECHOCARDIOGRAM COMPLETE
Calc EF: 40.6 %
Height: 72 in
S' Lateral: 4.5 cm
Single Plane A2C EF: 35.4 %
Single Plane A4C EF: 46 %
Weight: 2659.63 oz

## 2020-04-07 LAB — BASIC METABOLIC PANEL
Anion gap: 12 (ref 5–15)
BUN: 6 mg/dL (ref 6–20)
CO2: 28 mmol/L (ref 22–32)
Calcium: 9.4 mg/dL (ref 8.9–10.3)
Chloride: 98 mmol/L (ref 98–111)
Creatinine, Ser: 1.32 mg/dL — ABNORMAL HIGH (ref 0.61–1.24)
GFR calc Af Amer: >60 mL/min (ref >60)
GFR calc non Af Amer: 59 mL/min — ABNORMAL LOW (ref >60)
Glucose, Bld: 198 mg/dL — ABNORMAL HIGH (ref 70–99)
Potassium: 3.7 mmol/L (ref 3.5–5.1)
Sodium: 138 mmol/L (ref 135–145)

## 2020-04-07 LAB — GLUCOSE, CAPILLARY
Glucose-Capillary: 209 mg/dL — ABNORMAL HIGH (ref 70–99)
Glucose-Capillary: 166 mg/dL — ABNORMAL HIGH (ref 70–99)
Glucose-Capillary: 117 mg/dL — ABNORMAL HIGH (ref 70–99)
Glucose-Capillary: 138 mg/dL — ABNORMAL HIGH (ref 70–99)

## 2020-04-07 MED ORDER — FAMOTIDINE 20 MG PO TABS
20.0000 mg | ORAL_TABLET | Freq: Every day | ORAL | Status: DC
  Administered 2020-04-07 – 2020-04-08 (×2): 20 mg via ORAL
  Filled 2020-04-07 (×3): qty 1

## 2020-04-07 MED ORDER — ASPIRIN EC 81 MG PO TBEC
81.0000 mg | DELAYED_RELEASE_TABLET | Freq: Every day | ORAL | Status: DC
  Administered 2020-04-07 – 2020-04-08 (×2): 81 mg via ORAL
  Filled 2020-04-07 (×3): qty 1

## 2020-04-07 MED ORDER — ACETAMINOPHEN 325 MG PO TABS
650.0000 mg | ORAL_TABLET | Freq: Four times a day (QID) | ORAL | Status: DC | PRN
  Administered 2020-04-07 – 2020-04-08 (×2): 650 mg via ORAL
  Filled 2020-04-07 (×2): qty 2

## 2020-04-07 MED ORDER — UMECLIDINIUM BROMIDE 62.5 MCG/INH IN AEPB
1.0000 | INHALATION_SPRAY | Freq: Every day | RESPIRATORY_TRACT | Status: DC
  Administered 2020-04-07 – 2020-04-08 (×2): 1 via RESPIRATORY_TRACT
  Filled 2020-04-07: qty 7

## 2020-04-07 MED ORDER — CARVEDILOL 12.5 MG PO TABS
12.5000 mg | ORAL_TABLET | Freq: Two times a day (BID) | ORAL | Status: DC
  Administered 2020-04-07 – 2020-04-08 (×3): 12.5 mg via ORAL
  Filled 2020-04-07 (×3): qty 1

## 2020-04-07 MED ORDER — AMLODIPINE BESYLATE 5 MG PO TABS
5.0000 mg | ORAL_TABLET | Freq: Every day | ORAL | Status: DC
  Administered 2020-04-07 – 2020-04-08 (×2): 5 mg via ORAL
  Filled 2020-04-07 (×3): qty 1

## 2020-04-07 MED ORDER — FLUTICASONE FUROATE-VILANTEROL 100-25 MCG/INH IN AEPB
1.0000 | INHALATION_SPRAY | Freq: Every day | RESPIRATORY_TRACT | Status: DC
  Administered 2020-04-07 – 2020-04-08 (×2): 1 via RESPIRATORY_TRACT
  Filled 2020-04-07: qty 28

## 2020-04-07 MED ORDER — APIXABAN 5 MG PO TABS
5.0000 mg | ORAL_TABLET | Freq: Two times a day (BID) | ORAL | Status: DC
  Administered 2020-04-07 – 2020-04-08 (×3): 5 mg via ORAL
  Filled 2020-04-07 (×3): qty 1

## 2020-04-07 MED ORDER — LUBIPROSTONE 8 MCG PO CAPS
8.0000 ug | ORAL_CAPSULE | Freq: Two times a day (BID) | ORAL | Status: DC
  Administered 2020-04-07 – 2020-04-08 (×3): 8 ug via ORAL
  Filled 2020-04-07 (×3): qty 1

## 2020-04-07 MED ORDER — BUSPIRONE HCL 10 MG PO TABS
15.0000 mg | ORAL_TABLET | Freq: Two times a day (BID) | ORAL | Status: DC
  Administered 2020-04-07 – 2020-04-08 (×3): 15 mg via ORAL
  Filled 2020-04-07 (×3): qty 2

## 2020-04-07 MED ORDER — FLUOXETINE HCL 20 MG PO CAPS
60.0000 mg | ORAL_CAPSULE | Freq: Every day | ORAL | Status: DC
  Administered 2020-04-07 – 2020-04-08 (×2): 60 mg via ORAL
  Filled 2020-04-07 (×3): qty 3

## 2020-04-07 MED ORDER — TRAZODONE HCL 50 MG PO TABS: 50.0000 mg | ORAL_TABLET | Freq: Every evening | ORAL | Status: DC | PRN

## 2020-04-07 MED ORDER — IPRATROPIUM-ALBUTEROL 0.5-2.5 (3) MG/3ML IN SOLN
3.0000 mL | Freq: Three times a day (TID) | RESPIRATORY_TRACT | Status: DC
  Administered 2020-04-07 – 2020-04-08 (×4): 3 mL via RESPIRATORY_TRACT
  Filled 2020-04-07 (×4): qty 3

## 2020-04-07 MED ORDER — FUROSEMIDE 40 MG PO TABS
40.0000 mg | ORAL_TABLET | Freq: Every day | ORAL | Status: DC
  Administered 2020-04-08: 40 mg via ORAL
  Filled 2020-04-07 (×2): qty 1

## 2020-04-07 MED ORDER — FLUTICASONE-UMECLIDIN-VILANT 100-62.5-25 MCG/INH IN AEPB: 1.0000 | INHALATION_SPRAY | Freq: Every day | RESPIRATORY_TRACT | Status: DC

## 2020-04-07 MED ORDER — PREDNISONE 20 MG PO TABS
40.0000 mg | ORAL_TABLET | Freq: Every day | ORAL | Status: DC
  Administered 2020-04-08: 40 mg via ORAL
  Filled 2020-04-07 (×2): qty 2

## 2020-04-07 MED ORDER — ISOSORBIDE MONONITRATE ER 30 MG PO TB24
30.0000 mg | ORAL_TABLET | Freq: Every day | ORAL | Status: DC
  Administered 2020-04-07 – 2020-04-08 (×2): 30 mg via ORAL
  Filled 2020-04-07 (×3): qty 1

## 2020-04-07 NOTE — Progress Notes (Signed)
  Echocardiogram 2D Echocardiogram has been performed.  Kenneth [name] 04/07/2020, 8:37 AM

## 2020-04-07 NOTE — Progress Notes (Signed)
Pt ambulated approx. 150 feet maintaining O2 sat. 97% -98% on RA.  He denies SOB, CP, or labored breathing.   Pt tolerated well.  Will continue to monitor.

## 2020-04-07 NOTE — Plan of Care (Signed)

## 2020-04-07 NOTE — ED Notes (Signed)
Report given to Rowe Clack

## 2020-04-07 NOTE — Progress Notes (Signed)
Progress Note    Shinichi Anguiano  CNO:[id number] DOB: Dec 12, 1960  DOA: 04/06/2020 PCP: Mliss Sax, MD    Brief Narrative:     Medical records reviewed and are as summarized below:  Mykai Wendorf is an 59 y.o. male with medical history significant for Paroxysmal atrial fibrillation on Eliquis, combined systolic and diastolic heart failure, COPD, type 2 diabetes, CKD stage III, hypertension, hyperlipidemia and OSA on CPAP who presents with acute worsening shortness of breath.  Patient reports that he awoke from a nap today and felt like he could not breathe and took his breathing treatment which did not help. He also began to have anxiety and a panic attack so his girlfriend called EMS. He was otherwise in his normal state of health. He is on CPAP for OSA which he is compliant with at night but was not wearing it during his nap today. He denies any increasing cough or sputum production. No fevers. No chest pain. He normally sleeps with a few pillows but denies any new orthopnea or PND. No lower extremity edema. He has been compliant with his Lasix and Eliquis. He is in the process of trying to qualify for nighttime oxygen with pulmonology. Also reports that he has been trying to cut down on his tobacco use.  Assessment/Plan:   Active Problems:   Essential hypertension   Controlled type 2 diabetes mellitus without complication, without long-term current use of insulin (HCC)   PAF (paroxysmal atrial fibrillation) (HCC)   Tobacco abuse   Acute respiratory failure with hypoxia (HCC)   COPD with acute exacerbation (HCC)   Acute on chronic combined systolic and diastolic CHF (congestive heart failure) (HCC)   Acute hypoxic respiratory failure secondary to likely  Acute COPD exacerbation acute on chronic combined systolic and diastolic heart failure Scheduled duo-nebs. PRN albuterol.  -transition solu-medrol to PO prednisone in AM Flutter and incentive spirometry Last echo was  30-35% in 05/2019- repeat shows improving EF to 45% - IV Lasix 40mg  changed to PO lasix in AM at a higher dose Strict Is and Os Daily weights  -has been able to be weaned off O2 and did not require for ambulation  Tobacco abuse -encouraged cessation  Paroxysmal atrial fibrillation Continue Eliquis, metoprolol  Hypertension Continue amlodipine, metoprolol, Imdur Hold ARB due to AKI  Type 2 diabetes Moderate SSI  Family Communication/Anticipated D/C date and plan/Code Status   DVT prophylaxis: eliquis Code Status: Full Code.  Family Communication: wife at bedside Disposition Plan: Status is: Inpatient  Remains inpatient appropriate because:Inpatient level of care appropriate due to severity of illness   Dispo: The patient is from: Home              Anticipated d/c is to: Home              Anticipated d/c date is: 1 day              Patient currently is not medically stable to d/c.if continues to improve, can be d/c'd in AM         Medical Consultants:    None.   Subjective:   Says nebulizer helped more than anything so far Trying to quit smoking-- has cut back  Objective:    Vitals:   04/07/20 0040 04/07/20 0405 04/07/20 0749 04/07/20 1155  BP: (!) 161/82 (!) 165/89 (!) 153/90 (!) 159/86  Pulse:  82 85   Resp: 14 14 16  (!) 25  Temp: 97.6 F (36.4 C) 97.7  F (36.5 C) 98.7 F (37.1 C) 98 F (36.7 C)  TempSrc: Oral Oral Oral Oral  SpO2: 100% 98% 96%   Weight: 75.4 kg     Height: 6' (1.829 m)       Intake/Output Summary (Last 24 hours) at 04/07/2020 1202 Last data filed at 04/07/2020 0800 Gross per 24 hour  Intake 120 ml  Output --  Net 120 ml   Filed Weights   04/06/20 2004 04/07/20 0040  Weight: 76.9 kg 75.4 kg    Exam:  General: Appearance:    Well developed, well nourished male in no acute distress     Lungs:     No wheezing, diminished, respirations unlabored  Heart:  rrr  MS:   All extremities are intact.   Neurologic:   Awake,  alert, oriented x 3. No apparent focal neurological           defect.     Data Reviewed:   I have personally reviewed following labs and imaging studies:  Labs: Labs show the following:   Basic Metabolic Panel: Recent Labs  Lab 04/06/20 2011 04/06/20 2011 04/06/20 2046 04/07/20 0314  NA 138  --  138 138  K 3.8   < > 3.9 3.7  CL 100  --   --  98  CO2 27  --   --  28  GLUCOSE 205*  --   --  198*  BUN 5*  --   --  6  CREATININE 1.38*  --   --  1.32*  CALCIUM 8.9  --   --  9.4   < > = values in this interval not displayed.   GFR Estimated Creatinine Clearance: 65.1 mL/min (A) (by C-G formula based on SCr of 1.32 mg/dL (H)). Liver Function Tests: Recent Labs  Lab 04/06/20 2011  AST 26  ALT 17  ALKPHOS 74  BILITOT <0.1*  PROT 6.2*  ALBUMIN 3.6   No results for input(s): LIPASE, AMYLASE in the last 168 hours. No results for input(s): AMMONIA in the last 168 hours. Coagulation profile No results for input(s): INR, PROTIME in the last 168 hours.  CBC: Recent Labs  Lab 04/06/20 2011 04/06/20 2046 04/07/20 0314  WBC 6.2  --  5.0  NEUTROABS 2.8  --   --   HGB 13.2 14.6 13.6  HCT 42.6 43.0 41.9  MCV 98.4  --  96.1  PLT 336  --  284   Cardiac Enzymes: No results for input(s): CKTOTAL, CKMB, CKMBINDEX, TROPONINI in the last 168 hours. BNP (last 3 results) No results for input(s): PROBNP in the last 8760 hours. CBG: Recent Labs  Lab 04/07/20 0833 04/07/20 1154  GLUCAP 209* 166*   D-Dimer: No results for input(s): DDIMER in the last 72 hours. Hgb A1c: No results for input(s): HGBA1C in the last 72 hours. Lipid Profile: No results for input(s): CHOL, HDL, LDLCALC, TRIG, CHOLHDL, LDLDIRECT in the last 72 hours. Thyroid function studies: No results for input(s): TSH, T4TOTAL, T3FREE, THYROIDAB in the last 72 hours.  Invalid input(s): FREET3 Anemia work up: No results for input(s): VITAMINB12, FOLATE, FERRITIN, TIBC, IRON, RETICCTPCT in the last 72  hours. Sepsis Labs: Recent Labs  Lab 04/06/20 2011 04/07/20 0314  WBC 6.2 5.0    Microbiology Recent Results (from the past 240 hour(s))  SARS Coronavirus 2 by RT PCR (hospital order, performed in [facility] hospital lab) Nasopharyngeal Nasopharyngeal Swab     Status: None   Collection Time: 04/06/20  8:11  PM   Specimen: Nasopharyngeal Swab  Result Value Ref Range Status   SARS Coronavirus 2 NEGATIVE NEGATIVE Final    Comment: (NOTE) SARS-CoV-2 target nucleic acids are NOT DETECTED.  The SARS-CoV-2 RNA is generally detectable in upper and lower respiratory specimens during the acute phase of infection. The lowest concentration of SARS-CoV-2 viral copies this assay can detect is 250 copies / mL. A negative result does not preclude SARS-CoV-2 infection and should not be used as the sole basis for treatment or other patient management decisions.  A negative result may occur with improper specimen collection / handling, submission of specimen other than nasopharyngeal swab, presence of viral mutation(s) within the areas targeted by this assay, and inadequate number of viral copies (<250 copies / mL). A negative result must be combined with clinical observations, patient history, and epidemiological information.  Fact Sheet for Patients:   BoilerBrush.com.cy  Fact Sheet for Healthcare Providers: https://pope.com/  This test is not yet approved or  cleared by the [place] FDA and has been authorized for detection and/or diagnosis of SARS-CoV-2 by FDA under an Emergency Use Authorization (EUA).  This EUA will remain in effect (meaning this test can be used) for the duration of the COVID-19 declaration under Section 564(b)(1) of the Act, 21 U.S.C. section 360bbb-3(b)(1), unless the authorization is terminated or revoked sooner.  Performed at [facility], [address] [place], [place] [address]     Procedures and  diagnostic studies:  DG Chest Port 1 View  Result Date: 04/06/2020 CLINICAL DATA:  Shortness of breath. EXAM: PORTABLE CHEST 1 VIEW COMPARISON:  March 08, 2020. FINDINGS: The heart size and mediastinal contours are within normal limits. Both lungs are clear. No pneumothorax or pleural effusion is noted. The visualized skeletal structures are unremarkable. IMPRESSION: No active disease. Aortic Atherosclerosis (ICD10-I70.0). Electronically Signed   By: Lupita Raider M.D.   On: 04/06/2020 20:21   ECHOCARDIOGRAM COMPLETE  Result Date: 04/07/2020    ECHOCARDIOGRAM REPORT   Patient Name:   LISANDRO MEGGETT Date of Exam: 04/07/2020 Medical Rec #:  [id number]    Height:       72.0 in Accession #:    [id number]   Weight:       166.2 lb Date of Birth:  08-Feb-1961   BSA:          1.969 m Patient Age:    58 years     BP:           165/89 mmHg Patient Gender: M            HR:           83 bpm. Exam Location:  Inpatient Procedure: 2D Echo, Cardiac Doppler and Color Doppler Indications:    CHF-Acute Systolic 428.21 / I50.21  History:        Patient has prior history of Echocardiogram examinations, most                 recent 06/13/2019. CHF, COPD, Arrythmias:Atrial Fibrillation,                 Signs/Symptoms:Chest Pain and Shortness of Breath; Risk                 Factors:Hypertension, Diabetes and Current Smoker. GERD.  Sonographer:    Renella Cunas RDCS Referring Phys: [id number] CHING T TU IMPRESSIONS  1. LVEF is mildly depressed with inferior hypokinesis. Compared to previous echo, LVEF is improved. . Left ventricular ejection fraction,  by estimation, is 45 to 50%. The left ventricle has mildly decreased function. The left ventricle demonstrates regional wall motion abnormalities (see scoring diagram/findings for description). The left ventricular internal cavity size was mildly dilated. Indeterminate diastolic filling due to E-A fusion.  2. Right ventricular systolic function is normal. The right ventricular size is normal.  There is normal pulmonary artery systolic pressure.  3. The mitral valve is abnormal. Trivial mitral valve regurgitation.  4. The aortic valve is abnormal. Aortic valve regurgitation is mild. Mild to moderate aortic valve sclerosis/calcification is present, without any evidence of aortic stenosis.  5. The inferior vena cava is normal in size with greater than 50% respiratory variability, suggesting right atrial pressure of 3 mmHg. FINDINGS  Left Ventricle: LVEF is mildly depressed with inferior hypokinesis. Compared to previous echo, LVEF is improved. Left ventricular ejection fraction, by estimation, is 45 to 50%. The left ventricle has mildly decreased function. The left ventricle demonstrates regional wall motion abnormalities. The left ventricular internal cavity size was mildly dilated. There is no left ventricular hypertrophy. Indeterminate diastolic filling due to E-A fusion. Right Ventricle: The right ventricular size is normal. Right vetricular wall thickness was not assessed. Right ventricular systolic function is normal. There is normal pulmonary artery systolic pressure. The tricuspid regurgitant velocity is 1.53 m/s, and with an assumed right atrial pressure of 3 mmHg, the estimated right ventricular systolic pressure is 12.4 mmHg. Left Atrium: Left atrial size was normal in size. Right Atrium: Right atrial size was normal in size. Pericardium: There is no evidence of pericardial effusion. Mitral Valve: The mitral valve is abnormal. There is mild thickening of the mitral valve leaflet(s). Mild mitral annular calcification. Trivial mitral valve regurgitation. Tricuspid Valve: The tricuspid valve is normal in structure. Tricuspid valve regurgitation is trivial. Aortic Valve: The aortic valve is abnormal. Aortic valve regurgitation is mild. Mild to moderate aortic valve sclerosis/calcification is present, without any evidence of aortic stenosis. Pulmonic Valve: The pulmonic valve was not well visualized.  Pulmonic valve regurgitation is not visualized. Aorta: The aortic root is normal in size and structure. Venous: The inferior vena cava is normal in size with greater than 50% respiratory variability, suggesting right atrial pressure of 3 mmHg. IAS/Shunts: No atrial level shunt detected by color flow Doppler.  LEFT VENTRICLE PLAX 2D LVIDd:         5.60 cm LVIDs:         4.50 cm LV PW:         0.90 cm LV IVS:        0.90 cm LVOT diam:     2.30 cm LV SV:         71 LV SV Index:   36 LVOT Area:     4.15 cm  LV Volumes (MOD) LV vol d, MOD A2C: 121.0 ml LV vol d, MOD A4C: 117.0 ml LV vol s, MOD A2C: 78.2 ml LV vol s, MOD A4C: 63.2 ml LV SV MOD A2C:     42.8 ml LV SV MOD A4C:     117.0 ml LV SV MOD BP:      48.5 ml RIGHT VENTRICLE RV S prime:     12.60 cm/s TAPSE (M-mode): 2.2 cm LEFT ATRIUM             Index       RIGHT ATRIUM           Index LA diam:        3.30 cm 1.68 cm/m  RA Area:     12.90 cm LA Vol (A2C):   37.6 ml 19.09 ml/m RA Volume:   28.00 ml  14.22 ml/m LA Vol (A4C):   25.7 ml 13.05 ml/m LA Biplane Vol: 31.0 ml 15.74 ml/m  AORTIC VALVE LVOT Vmax:   80.40 cm/s LVOT Vmean:  57.300 cm/s LVOT VTI:    0.171 m  AORTA Ao Root diam: 3.40 cm TRICUSPID VALVE TR Peak grad:   9.4 mmHg TR Vmax:        153.00 cm/s  SHUNTS Systemic VTI:  0.17 m Systemic Diam: 2.30 cm Dietrich Pates MD Electronically signed by Dietrich Pates MD Signature Date/Time: 04/07/2020/10:37:08 AM    Final     Medications:   . amLODipine  5 mg Oral Daily  . apixaban  5 mg Oral BID  . aspirin EC  81 mg Oral Daily  . busPIRone  15 mg Oral BID  . carvedilol  12.5 mg Oral BID WC  . famotidine  20 mg Oral Daily  . FLUoxetine  60 mg Oral Daily  . fluticasone furoate-vilanterol  1 puff Inhalation Daily  . [START ON 04/08/2020] furosemide  40 mg Oral Daily  . insulin aspart  0-15 Units Subcutaneous TID WC  . ipratropium-albuterol  3 mL Nebulization TID  . isosorbide mononitrate  30 mg Oral Daily  . lubiprostone  8 mcg Oral BID WC  . [START ON  04/08/2020] predniSONE  40 mg Oral Q breakfast  . umeclidinium bromide  1 puff Inhalation Daily   Continuous Infusions:   LOS: 1 day   Joseph Art  [facility]   How to contact the [facility] Attending or Consulting provider 7A - 7P or covering provider during after hours 7P -7A, for this patient?  1. Check the care team in [facility] and look for a) attending/consulting [facility] provider listed and b) the [facility] team listed 2. Log into [email or web address] and use [place]'s universal password to access. If you do not have the password, please contact the hospital operator. 3. Locate the [facility] provider you are looking for under [facility] and page to a number that you can be directly reached. 4. If you still have difficulty reaching the provider, please page the [facility] (Director on Call) for the Hospitalists listed on amion for assistance.  04/07/2020, 12:02 PM

## 2020-04-08 DIAGNOSIS — E118 Type 2 diabetes mellitus with unspecified complications: Secondary | ICD-10-CM

## 2020-04-08 LAB — GLUCOSE, CAPILLARY: Glucose-Capillary: 92 mg/dL (ref 70–99)

## 2020-04-08 MED ORDER — PREDNISONE 10 MG PO TABS: ORAL_TABLET | ORAL | 0 refills | Status: DC

## 2020-04-08 NOTE — Discharge Summary (Signed)
Physician Discharge Summary  Kenneth [name] YTK:[id number] DOB: 03/07/1961 DOA: 04/06/2020  PCP: [name], MD  Admit date: 04/06/2020 Discharge date: 04/08/2020  Admitted From: home Discharge disposition: home   Recommendations for Outpatient Follow-Up:   1. Outpatient PFTs 2. Smoking cessation   Discharge Diagnosis:   Active Problems:   Essential hypertension   Controlled type 2 diabetes mellitus without complication, without long-term current use of insulin (HCC)   PAF (paroxysmal atrial fibrillation) (HCC)   Tobacco abuse   Acute respiratory failure with hypoxia (HCC)   COPD with acute exacerbation (HCC)   Acute on chronic combined systolic and diastolic CHF (congestive heart failure) ([place])    Discharge Condition: Improved.  Diet recommendation: Low sodium, heart healthy.  Carbohydrate-modified.  Wound care: None.  Code status: Full.   History of Present Illness:   Kenneth [name] is a 59 y.o. male with medical history significant for Paroxysmal atrial fibrillation on Eliquis, combined systolic and diastolic heart failure, COPD, type 2 diabetes, CKD stage III, hypertension, hyperlipidemia and OSA on CPAP who presents with acute worsening shortness of breath.  Patient reports that he awoke from a nap today and felt like he could not breathe and took his breathing treatment which did not help. He also began to have anxiety and a panic attack so his girlfriend called EMS. He was otherwise in his normal state of health. He is on CPAP for OSA which he is compliant with at night but was not wearing it during his nap today. He denies any increasing cough or sputum production. No fevers. No chest pain. He normally sleeps with a few pillows but denies any new orthopnea or PND. No lower extremity edema. He has been compliant with his Lasix and Eliquis. He is in the process of trying to qualify for nighttime oxygen with pulmonology. Also reports that he has been  trying to cut down on his tobacco use.    Hospital Course by Problem:   Acute hypoxic respiratory failure secondary to likely  Acute COPD exacerbation acute on chronic combined systolic and diastolic heart failure Scheduled duo-nebs. PRN albuterol.  -transition solu-medrol to PO prednisone in AM Flutter and incentive spirometry Last echo was 30-35% in 05/2019- repeat shows improving EF to 45% - IV Lasix 58m changed to PO lasix in AM  Strict Is and Os Daily weights  -has been able to be weaned off O2 and did not require O2 for ambulation  Tobacco abuse -encouraged cessation  Paroxysmal atrial fibrillation Continue Eliquis, metoprolol  Hypertension -resume home meds  Type 2 diabetes Resume home med     Medical Consultants:      Discharge Exam:   Vitals:   04/08/20 0746 04/08/20 0747  BP:    Pulse:    Resp:    Temp:    SpO2: 100% 100%   Vitals:   04/07/20 2131 04/08/20 0745 04/08/20 0746 04/08/20 0747  BP: (!) 156/83     Pulse: 64     Resp: 18     Temp: 98.5 F (36.9 C)     TempSrc: Oral     SpO2: 99% 99% 100% 100%  Weight:      Height:        General exam: Appears calm and comfortable.    The results of significant diagnostics from this hospitalization (including imaging, microbiology, ancillary and laboratory) are listed below for reference.     Procedures and Diagnostic Studies:   [facility]  Result Date: 04/06/2020 CLINICAL DATA:  Shortness of breath. EXAM: PORTABLE CHEST 1 VIEW COMPARISON:  March 08, 2020. FINDINGS: The heart size and mediastinal contours are within normal limits. Both lungs are clear. No pneumothorax or pleural effusion is noted. The visualized skeletal structures are unremarkable. IMPRESSION: No active disease. Aortic Atherosclerosis (ICD10-I70.0). Electronically Signed   By: Marijo Conception M.D.   On: 04/06/2020 20:21   ECHOCARDIOGRAM COMPLETE  Result Date: 04/07/2020    ECHOCARDIOGRAM REPORT   Patient  Name:   Kenneth [name] Date of Exam: 04/07/2020 Medical Rec #:  [id number]    Height:       72.0 in Accession #:    [id number]   Weight:       166.2 lb Date of Birth:  04/12/1961   BSA:          1.969 m Patient Age:    74 years     BP:           165/89 mmHg Patient Gender: M            HR:           83 bpm. Exam Location:  Inpatient Procedure: 2D Echo, Cardiac Doppler and Color Doppler Indications:    CHF-Acute Systolic 976.73 / A19.37  History:        Patient has prior history of Echocardiogram examinations, most                 recent 06/13/2019. CHF, COPD, Arrythmias:Atrial Fibrillation,                 Signs/Symptoms:Chest Pain and Shortness of Breath; Risk                 Factors:Hypertension, Diabetes and Current Smoker. GERD.  Sonographer:    Vickie Epley RDCS Referring Phys: [id number] Mims T TU IMPRESSIONS  1. LVEF is mildly depressed with inferior hypokinesis. Compared to previous echo, LVEF is improved. . Left ventricular ejection fraction, by estimation, is 45 to 50%. The left ventricle has mildly decreased function. The left ventricle demonstrates regional wall motion abnormalities (see scoring diagram/findings for description). The left ventricular internal cavity size was mildly dilated. Indeterminate diastolic filling due to E-A fusion.  2. Right ventricular systolic function is normal. The right ventricular size is normal. There is normal pulmonary artery systolic pressure.  3. The mitral valve is abnormal. Trivial mitral valve regurgitation.  4. The aortic valve is abnormal. Aortic valve regurgitation is mild. Mild to moderate aortic valve sclerosis/calcification is present, without any evidence of aortic stenosis.  5. The inferior vena cava is normal in size with greater than 50% respiratory variability, suggesting right atrial pressure of 3 mmHg. FINDINGS  Left Ventricle: LVEF is mildly depressed with inferior hypokinesis. Compared to previous echo, LVEF is improved. Left ventricular ejection fraction,  by estimation, is 45 to 50%. The left ventricle has mildly decreased function. The left ventricle demonstrates regional wall motion abnormalities. The left ventricular internal cavity size was mildly dilated. There is no left ventricular hypertrophy. Indeterminate diastolic filling due to E-A fusion. Right Ventricle: The right ventricular size is normal. Right vetricular wall thickness was not assessed. Right ventricular systolic function is normal. There is normal pulmonary artery systolic pressure. The tricuspid regurgitant velocity is 1.53 m/s, and with an assumed right atrial pressure of 3 mmHg, the estimated right ventricular systolic pressure is 35.3 mmHg. Left Atrium: Left atrial size was normal in size. Right Atrium: Right atrial size was normal in  size. Pericardium: There is no evidence of pericardial effusion. Mitral Valve: The mitral valve is abnormal. There is mild thickening of the mitral valve leaflet(s). Mild mitral annular calcification. Trivial mitral valve regurgitation. Tricuspid Valve: The tricuspid valve is normal in structure. Tricuspid valve regurgitation is trivial. Aortic Valve: The aortic valve is abnormal. Aortic valve regurgitation is mild. Mild to moderate aortic valve sclerosis/calcification is present, without any evidence of aortic stenosis. Pulmonic Valve: The pulmonic valve was not well visualized. Pulmonic valve regurgitation is not visualized. Aorta: The aortic root is normal in size and structure. Venous: The inferior vena cava is normal in size with greater than 50% respiratory variability, suggesting right atrial pressure of 3 mmHg. IAS/Shunts: No atrial level shunt detected by color flow Doppler.  LEFT VENTRICLE PLAX 2D LVIDd:         5.60 cm LVIDs:         4.50 cm LV PW:         0.90 cm LV IVS:        0.90 cm LVOT diam:     2.30 cm LV SV:         71 LV SV Index:   36 LVOT Area:     4.15 cm  LV Volumes (MOD) LV vol d, MOD A2C: 121.0 ml LV vol d, MOD A4C: 117.0 ml LV vol s,  MOD A2C: 78.2 ml LV vol s, MOD A4C: 63.2 ml LV SV MOD A2C:     42.8 ml LV SV MOD A4C:     117.0 ml LV SV MOD BP:      48.5 ml RIGHT VENTRICLE RV S prime:     12.60 cm/s TAPSE (M-mode): 2.2 cm LEFT ATRIUM             Index       RIGHT ATRIUM           Index LA diam:        3.30 cm 1.68 cm/m  RA Area:     12.90 cm LA Vol (A2C):   37.6 ml 19.09 ml/m RA Volume:   28.00 ml  14.22 ml/m LA Vol (A4C):   25.7 ml 13.05 ml/m LA Biplane Vol: 31.0 ml 15.74 ml/m  AORTIC VALVE LVOT Vmax:   80.40 cm/s LVOT Vmean:  57.300 cm/s LVOT VTI:    0.171 m  AORTA Ao Root diam: 3.40 cm TRICUSPID VALVE TR Peak grad:   9.4 mmHg TR Vmax:        153.00 cm/s  SHUNTS Systemic VTI:  0.17 m Systemic Diam: 2.30 cm Dorris Carnes MD Electronically signed by Dorris Carnes MD Signature Date/Time: 04/07/2020/10:37:08 AM    Final      Labs:   Basic Metabolic Panel: Recent Labs  Lab 04/06/20 2011 04/06/20 2011 04/06/20 2046 04/07/20 0314  NA 138  --  138 138  K 3.8   < > 3.9 3.7  CL 100  --   --  98  CO2 27  --   --  28  GLUCOSE 205*  --   --  198*  BUN 5*  --   --  6  CREATININE 1.38*  --   --  1.32*  CALCIUM 8.9  --   --  9.4   < > = values in this interval not displayed.   GFR Estimated Creatinine Clearance: 65.1 mL/min (A) (by C-G formula based on SCr of 1.32 mg/dL (H)). Liver Function Tests: Recent Labs  Lab 04/06/20 2011  AST 26  ALT 17  ALKPHOS 74  BILITOT <0.1*  PROT 6.2*  ALBUMIN 3.6   No results for input(s): LIPASE, AMYLASE in the last 168 hours. No results for input(s): AMMONIA in the last 168 hours. Coagulation profile No results for input(s): INR, PROTIME in the last 168 hours.  CBC: Recent Labs  Lab 04/06/20 2011 04/06/20 2046 04/07/20 0314  WBC 6.2  --  5.0  NEUTROABS 2.8  --   --   HGB 13.2 14.6 13.6  HCT 42.6 43.0 41.9  MCV 98.4  --  96.1  PLT 336  --  284   Cardiac Enzymes: No results for input(s): CKTOTAL, CKMB, CKMBINDEX, TROPONINI in the last 168 hours. BNP: Invalid input(s):  POCBNP CBG: Recent Labs  Lab 04/07/20 0833 04/07/20 1154 04/07/20 1621 04/07/20 2128 04/08/20 0739  GLUCAP 209* 166* 117* 138* 92   D-Dimer No results for input(s): DDIMER in the last 72 hours. Hgb A1c No results for input(s): HGBA1C in the last 72 hours. Lipid Profile No results for input(s): CHOL, HDL, LDLCALC, TRIG, CHOLHDL, LDLDIRECT in the last 72 hours. Thyroid function studies No results for input(s): TSH, T4TOTAL, T3FREE, THYROIDAB in the last 72 hours.  Invalid input(s): FREET3 Anemia work up No results for input(s): VITAMINB12, FOLATE, FERRITIN, TIBC, IRON, RETICCTPCT in the last 72 hours. Microbiology Recent Results (from the past 240 hour(s))  SARS Coronavirus 2 by RT PCR (hospital order, performed in [facility] hospital lab) Nasopharyngeal Nasopharyngeal Swab     Status: None   Collection Time: 04/06/20  8:11 PM   Specimen: Nasopharyngeal Swab  Result Value Ref Range Status   SARS Coronavirus 2 NEGATIVE NEGATIVE Final    Comment: (NOTE) SARS-CoV-2 target nucleic acids are NOT DETECTED.  The SARS-CoV-2 RNA is generally detectable in upper and lower respiratory specimens during the acute phase of infection. The lowest concentration of SARS-CoV-2 viral copies this assay can detect is 250 copies / mL. A negative result does not preclude SARS-CoV-2 infection and should not be used as the sole basis for treatment or other patient management decisions.  A negative result may occur with improper specimen collection / handling, submission of specimen other than nasopharyngeal swab, presence of viral mutation(s) within the areas targeted by this assay, and inadequate number of viral copies (<250 copies / mL). A negative result must be combined with clinical observations, patient history, and epidemiological information.  Fact Sheet for Patients:   StrictlyIdeas.no  Fact Sheet for Healthcare  Providers: BankingDealers.co.za  This test is not yet approved or  cleared by the [place] FDA and has been authorized for detection and/or diagnosis of SARS-CoV-2 by FDA under an Emergency Use Authorization (EUA).  This EUA will remain in effect (meaning this test can be used) for the duration of the COVID-19 declaration under Section 564(b)(1) of the Act, 21 U.S.C. section 360bbb-3(b)(1), unless the authorization is terminated or revoked sooner.  Performed at [facility], [place] [address] [place], [place] [address]      Discharge Instructions:   Discharge Instructions    Diet - low sodium heart healthy   Complete by: As directed    Diet Carb Modified   Complete by: As directed    Discharge instructions   Complete by: As directed    Smoking cessation Can use OTC mucinex to help loosen mucous   Increase activity slowly   Complete by: As directed      Allergies as of 04/08/2020      Reactions  Lisinopril Swelling   Other Other (See Comments)   Lettuce : rash   Tomato Rash      Medication List    TAKE these medications   acetaminophen 325 MG tablet Commonly known as: TYLENOL Take 650 mg by mouth every 6 (six) hours as needed for mild pain or headache.   albuterol 108 (90 Base) MCG/ACT inhaler Commonly known as: VENTOLIN HFA Inhale 1-2 puffs into the lungs every 4 (four) hours as needed for wheezing or shortness of breath.   albuterol (2.5 MG/3ML) 0.083% nebulizer solution Commonly known as: PROVENTIL Take 3 mLs (2.5 mg total) by nebulization 5 (five) times daily as needed for wheezing or shortness of breath.   amLODipine 5 MG tablet Commonly known as: NORVASC TAKE 1 TABLET BY MOUTH  DAILY What changed: Another medication with the same name was removed. Continue taking this medication, and follow the directions you see here.   apixaban 5 MG Tabs tablet Commonly known as: Eliquis Take 1 tablet (5 mg total) by mouth 2 (two)  times daily.   aspirin EC 81 MG tablet Take 81 mg by mouth daily.   busPIRone 15 MG tablet Commonly known as: BUSPAR Take 1 tablet (15 mg total) by mouth 2 (two) times daily.   carvedilol 12.5 MG tablet Commonly known as: COREG Take 1 tablet (12.5 mg total) by mouth 2 (two) times daily with a meal.   diphenhydrAMINE 25 MG tablet Commonly known as: BENADRYL Take 50 mg by mouth every 6 (six) hours as needed for itching or allergies.   famotidine 20 MG tablet Commonly known as: PEPCID TAKE 1 TABLET BY MOUTH  DAILY   FLUoxetine 20 MG capsule Commonly known as: PROZAC TAKE 3 CAPSULES BY MOUTH  DAILY   furosemide 20 MG tablet Commonly known as: LASIX TAKE 1 TABLET BY MOUTH  DAILY   ipratropium 0.02 % nebulizer solution Commonly known as: ATROVENT Take 2.5 mLs (0.5 mg total) by nebulization 4 (four) times daily.   isosorbide mononitrate 30 MG 24 hr tablet Commonly known as: IMDUR TAKE 1 TABLET BY MOUTH EVERY DAY   losartan 25 MG tablet Commonly known as: COZAAR Take 1 tablet (25 mg total) by mouth daily. Please schedule annual appt with Dr. Harrell Gave for refills. [phone]. 3rd attempt. What changed: additional instructions   lubiprostone 8 MCG capsule Commonly known as: [name] Take 1 capsule (8 mcg total) by mouth 2 (two) times daily with a meal.   metFORMIN 500 MG tablet Commonly known as: GLUCOPHAGE TAKE 1 TABLET BY MOUTH  TWICE DAILY WITH MEALS   multivitamin with minerals Tabs tablet Take 1 tablet by mouth daily.   nitroGLYCERIN 0.4 MG SL tablet Commonly known as: NITROSTAT DISSOLVE 1 TABLET UNDER THE TONGUE EVERY 5 MINUTES AS  NEEDED FOR CHEST PAIN. MAX  OF 3 TABLETS IN 15 MINUTES. CALL 911 IF PAIN PERSISTS. What changed: See the new instructions.   ONE TOUCH ULTRA 2 w/Device Kit Use to test blood sugars 1-2 times daily.   OneTouch Ultra test strip Generic drug: glucose blood Use to test blood sugars 1-2 times daily.   onetouch ultrasoft  lancets Use to test blood sugars 1-2 times daily.   pantoprazole 20 MG tablet Commonly known as: Protonix Take 1 tablet (20 mg total) by mouth daily.   predniSONE 10 MG tablet Commonly known as: DELTASONE 40 mg x 2 days then 30 mg x 2 days then 20 mg x 2 days then 10 mg x 2 days then stop  traZODone 50 MG tablet Commonly known as: DESYREL TAKE 1/2 TO 1 TABLET BY  MOUTH AT BEDTIME AS NEEDED  FOR SLEEP. What changed:   how much to take  reasons to take this  additional instructions   Trelegy Ellipta 100-62.5-25 MCG/INH Aepb Generic drug: Fluticasone-Umeclidin-Vilant Inhale 1 puff into the lungs daily.       Follow-up Information    [name], MD Follow up in 1 week(s).   Specialty: Family Medicine Contact information: [place] [address] [phone]        Buford Dresser, MD .   Specialty: Cardiology Contact information: [address] [place] [address] [phone]                Time coordinating discharge: 35 min  Signed:  Geradine Girt DO  [facility] 04/08/2020, 9:00 AM

## 2020-04-11 ENCOUNTER — Other Ambulatory Visit: Payer: Self-pay | Admitting: *Deleted

## 2020-04-11 NOTE — Patient Outreach (Signed)
[facility]) Care Management  04/11/2020  Kenneth [name] April 30, 1961 [id number]   Call placed to member to follow up on readmission related to COPD.  State he is doing well today, denies any shortness of breath.  Report he has been taking his medications as instructed, including his inhaler and nebulizers.  Also report he is using his CPAP nightly, reminded to use while he is taking naps as well.  He continues to work on smoking cessation, but report he has decreased daily smoking.  He has PFT's and office visit with pulmonology on 8/11, does not have appointment scheduled with PCP but will call to schedule.  Denies any urgent concerns at this time, advised to contact this care manager with questions.  Will follow up within the next month.  Goals Addressed              This Visit's Progress   .  I want to get my breathing better and stay out of the hospital (pt-stated)        CARE PLAN ENTRY (see longtitudinal plan of care for additional care plan information)  Current Barriers:  [name] Knowledge deficits related to basic understanding of COPD disease process . Knowledge deficits related to basic COPD self care/management   Case Manager Clinical Goal(s):  Over the next 28 days, patient will be able to verbalize understanding of COPD action plan and when to seek appropriate levels of medical care  Over the next 28 days, patient will engage in lite exercise as tolerated to build/regain stamina and strength and reduce shortness of breath through activity tolerance  Over the next 31 days, patient will not be hospitalized for COPD exacerbation   Interventions:   Provided patient with basic written and verbal COPD education on self care/management/and exacerbation prevention   Advised patient to self assesses COPD action plan zone and make appointment with provider if in the yellow zone for 48 hours without improvement.  Provided patient with education about the role of  exercise in the management of COPD  Advised patient to engage in light exercise as tolerated 3-5 days a week  Patient Self Care Activities:  . Takes medications as prescribed including inhalers . Self assesses COPD action plan zone and makes appointment with provider if in the yellow zone for 48 hours without improvement. . Utilizes infection prevention strategies to reduce risk of respiratory infection  . Does not contact provider office for questions/concerns  Initial goal documentation       [name], RN, MSN [facility] Care Management  [facility] Manager [phone]

## 2020-04-13 ENCOUNTER — Encounter (HOSPITAL_COMMUNITY): Payer: Self-pay | Admitting: *Deleted

## 2020-04-13 ENCOUNTER — Inpatient Hospital Stay (HOSPITAL_COMMUNITY)
Admission: EM | Admit: 2020-04-13 | Discharge: 2020-04-16 | DRG: 291 | Disposition: A | Discharge status: Home / Self Care | Payer: Medicare Other | Attending: Internal Medicine | Admitting: Internal Medicine

## 2020-04-13 ENCOUNTER — Emergency Department (HOSPITAL_COMMUNITY): Payer: Medicare Other

## 2020-04-13 ENCOUNTER — Other Ambulatory Visit: Payer: Self-pay

## 2020-04-13 DIAGNOSIS — F419 Anxiety disorder, unspecified: Secondary | ICD-10-CM | POA: Diagnosis present

## 2020-04-13 DIAGNOSIS — I152 Hypertension secondary to endocrine disorders: Secondary | ICD-10-CM | POA: Diagnosis present

## 2020-04-13 DIAGNOSIS — Z7901 Long term (current) use of anticoagulants: Secondary | ICD-10-CM

## 2020-04-13 DIAGNOSIS — F17210 Nicotine dependence, cigarettes, uncomplicated: Secondary | ICD-10-CM | POA: Diagnosis present

## 2020-04-13 DIAGNOSIS — K589 Irritable bowel syndrome without diarrhea: Secondary | ICD-10-CM | POA: Diagnosis not present

## 2020-04-13 DIAGNOSIS — J449 Chronic obstructive pulmonary disease, unspecified: Secondary | ICD-10-CM | POA: Diagnosis present

## 2020-04-13 DIAGNOSIS — Z7951 Long term (current) use of inhaled steroids: Secondary | ICD-10-CM

## 2020-04-13 DIAGNOSIS — F1210 Cannabis abuse, uncomplicated: Secondary | ICD-10-CM | POA: Diagnosis present

## 2020-04-13 DIAGNOSIS — F329 Major depressive disorder, single episode, unspecified: Secondary | ICD-10-CM | POA: Diagnosis present

## 2020-04-13 DIAGNOSIS — J9601 Acute respiratory failure with hypoxia: Secondary | ICD-10-CM | POA: Diagnosis not present

## 2020-04-13 DIAGNOSIS — I5042 Chronic combined systolic (congestive) and diastolic (congestive) heart failure: Secondary | ICD-10-CM | POA: Diagnosis present

## 2020-04-13 DIAGNOSIS — Z833 Family history of diabetes mellitus: Secondary | ICD-10-CM | POA: Diagnosis not present

## 2020-04-13 DIAGNOSIS — K219 Gastro-esophageal reflux disease without esophagitis: Secondary | ICD-10-CM | POA: Diagnosis not present

## 2020-04-13 DIAGNOSIS — E785 Hyperlipidemia, unspecified: Secondary | ICD-10-CM | POA: Diagnosis present

## 2020-04-13 DIAGNOSIS — I5043 Acute on chronic combined systolic (congestive) and diastolic (congestive) heart failure: Secondary | ICD-10-CM | POA: Diagnosis present

## 2020-04-13 DIAGNOSIS — J439 Emphysema, unspecified: Secondary | ICD-10-CM | POA: Diagnosis present

## 2020-04-13 DIAGNOSIS — I10 Essential (primary) hypertension: Secondary | ICD-10-CM | POA: Diagnosis present

## 2020-04-13 DIAGNOSIS — E119 Type 2 diabetes mellitus without complications: Secondary | ICD-10-CM

## 2020-04-13 DIAGNOSIS — J441 Chronic obstructive pulmonary disease with (acute) exacerbation: Secondary | ICD-10-CM

## 2020-04-13 DIAGNOSIS — Z20822 Contact with and (suspected) exposure to covid-19: Secondary | ICD-10-CM | POA: Diagnosis present

## 2020-04-13 DIAGNOSIS — Z7984 Long term (current) use of oral hypoglycemic drugs: Secondary | ICD-10-CM

## 2020-04-13 DIAGNOSIS — I5023 Acute on chronic systolic (congestive) heart failure: Secondary | ICD-10-CM | POA: Diagnosis present

## 2020-04-13 DIAGNOSIS — Z91018 Allergy to other foods: Secondary | ICD-10-CM

## 2020-04-13 DIAGNOSIS — I480 Paroxysmal atrial fibrillation: Secondary | ICD-10-CM | POA: Diagnosis not present

## 2020-04-13 DIAGNOSIS — R069 Unspecified abnormalities of breathing: Secondary | ICD-10-CM | POA: Diagnosis not present

## 2020-04-13 DIAGNOSIS — N182 Chronic kidney disease, stage 2 (mild): Secondary | ICD-10-CM | POA: Diagnosis not present

## 2020-04-13 DIAGNOSIS — G4733 Obstructive sleep apnea (adult) (pediatric): Secondary | ICD-10-CM | POA: Diagnosis present

## 2020-04-13 DIAGNOSIS — R0602 Shortness of breath: Secondary | ICD-10-CM | POA: Diagnosis not present

## 2020-04-13 DIAGNOSIS — Z7982 Long term (current) use of aspirin: Secondary | ICD-10-CM | POA: Diagnosis not present

## 2020-04-13 DIAGNOSIS — Z8249 Family history of ischemic heart disease and other diseases of the circulatory system: Secondary | ICD-10-CM

## 2020-04-13 DIAGNOSIS — Z79899 Other long term (current) drug therapy: Secondary | ICD-10-CM | POA: Diagnosis not present

## 2020-04-13 DIAGNOSIS — F418 Other specified anxiety disorders: Secondary | ICD-10-CM | POA: Diagnosis present

## 2020-04-13 DIAGNOSIS — E1159 Type 2 diabetes mellitus with other circulatory complications: Secondary | ICD-10-CM | POA: Diagnosis present

## 2020-04-13 DIAGNOSIS — I130 Hypertensive heart and chronic kidney disease with heart failure and stage 1 through stage 4 chronic kidney disease, or unspecified chronic kidney disease: Secondary | ICD-10-CM | POA: Diagnosis not present

## 2020-04-13 DIAGNOSIS — Z888 Allergy status to other drugs, medicaments and biological substances status: Secondary | ICD-10-CM

## 2020-04-13 DIAGNOSIS — E1165 Type 2 diabetes mellitus with hyperglycemia: Secondary | ICD-10-CM | POA: Diagnosis not present

## 2020-04-13 DIAGNOSIS — E1122 Type 2 diabetes mellitus with diabetic chronic kidney disease: Secondary | ICD-10-CM | POA: Diagnosis present

## 2020-04-13 DIAGNOSIS — Z743 Need for continuous supervision: Secondary | ICD-10-CM | POA: Diagnosis not present

## 2020-04-13 DIAGNOSIS — R61 Generalized hyperhidrosis: Secondary | ICD-10-CM | POA: Diagnosis not present

## 2020-04-13 LAB — COMPREHENSIVE METABOLIC PANEL
ALT: 41 U/L (ref 0–44)
AST: 37 U/L (ref 15–41)
Albumin: 3.5 g/dL (ref 3.5–5.0)
Alkaline Phosphatase: 50 U/L (ref 38–126)
Anion gap: 16 — ABNORMAL HIGH (ref 5–15)
BUN: 16 mg/dL (ref 6–20)
CO2: 25 mmol/L (ref 22–32)
Calcium: 9.1 mg/dL (ref 8.9–10.3)
Chloride: 95 mmol/L — ABNORMAL LOW (ref 98–111)
Creatinine, Ser: 1.15 mg/dL (ref 0.61–1.24)
GFR calc Af Amer: >60 mL/min (ref >60)
GFR calc non Af Amer: >60 mL/min (ref >60)
Glucose, Bld: 199 mg/dL — ABNORMAL HIGH (ref 70–99)
Potassium: 3.9 mmol/L (ref 3.5–5.1)
Sodium: 136 mmol/L (ref 135–145)
Total Bilirubin: 0.5 mg/dL (ref 0.3–1.2)
Total Protein: 6.1 g/dL — ABNORMAL LOW (ref 6.5–8.1)

## 2020-04-13 LAB — CBC
HCT: 38.9 % — ABNORMAL LOW (ref 39.0–52.0)
Hemoglobin: 12.2 g/dL — ABNORMAL LOW (ref 13.0–17.0)
MCH: 30.5 pg (ref 26.0–34.0)
MCHC: 31.4 g/dL (ref 30.0–36.0)
MCV: 97.3 fL (ref 80.0–100.0)
Platelets: 279 10*3/uL (ref 150–400)
RBC: 4 MIL/uL — ABNORMAL LOW (ref 4.22–5.81)
RDW: 12.6 % (ref 11.5–15.5)
WBC: 8.8 10*3/uL (ref 4.0–10.5)
nRBC: 0 % (ref 0.0–0.2)

## 2020-04-13 LAB — MAGNESIUM: Magnesium: 2.2 mg/dL (ref 1.7–2.4)

## 2020-04-13 LAB — HEMOGLOBIN A1C
Hgb A1c MFr Bld: 6.4 % — ABNORMAL HIGH (ref 4.8–5.6)
Mean Plasma Glucose: 136.98 mg/dL

## 2020-04-13 LAB — BRAIN NATRIURETIC PEPTIDE: B Natriuretic Peptide: 1267.7 pg/mL — ABNORMAL HIGH (ref 0.0–100.0)

## 2020-04-13 LAB — TROPONIN I (HIGH SENSITIVITY)
Troponin I (High Sensitivity): 11 ng/L (ref <18)
Troponin I (High Sensitivity): 12 ng/L (ref <18)

## 2020-04-13 LAB — SARS CORONAVIRUS 2 BY RT PCR (HOSPITAL ORDER, PERFORMED IN ~~LOC~~ HOSPITAL LAB): SARS Coronavirus 2: NEGATIVE

## 2020-04-13 LAB — GLUCOSE, CAPILLARY: Glucose-Capillary: 251 mg/dL — ABNORMAL HIGH (ref 70–99)

## 2020-04-13 MED ORDER — AZITHROMYCIN 250 MG PO TABS
500.0000 mg | ORAL_TABLET | Freq: Every day | ORAL | Status: DC
  Administered 2020-04-14: 500 mg via ORAL
  Filled 2020-04-13: qty 2

## 2020-04-13 MED ORDER — IPRATROPIUM BROMIDE 0.02 % IN SOLN
0.5000 mg | RESPIRATORY_TRACT | Status: DC
  Administered 2020-04-13: 0.5 mg via RESPIRATORY_TRACT
  Filled 2020-04-13: qty 2.5

## 2020-04-13 MED ORDER — APIXABAN 5 MG PO TABS
5.0000 mg | ORAL_TABLET | Freq: Two times a day (BID) | ORAL | Status: DC
  Administered 2020-04-13 – 2020-04-16 (×6): 5 mg via ORAL
  Filled 2020-04-13 (×6): qty 1

## 2020-04-13 MED ORDER — INSULIN ASPART 100 UNIT/ML ~~LOC~~ SOLN
0.0000 [IU] | Freq: Every day | SUBCUTANEOUS | Status: DC
  Administered 2020-04-13: 3 [IU] via SUBCUTANEOUS
  Administered 2020-04-14: 5 [IU] via SUBCUTANEOUS

## 2020-04-13 MED ORDER — IPRATROPIUM BROMIDE 0.02 % IN SOLN: 0.5000 mg | Freq: Four times a day (QID) | RESPIRATORY_TRACT | Status: DC

## 2020-04-13 MED ORDER — ALBUTEROL SULFATE (2.5 MG/3ML) 0.083% IN NEBU
5.0000 mg | INHALATION_SOLUTION | Freq: Once | RESPIRATORY_TRACT | Status: AC
  Administered 2020-04-13: 5 mg via RESPIRATORY_TRACT
  Filled 2020-04-13: qty 6

## 2020-04-13 MED ORDER — ONDANSETRON HCL 4 MG PO TABS: 4.0000 mg | ORAL_TABLET | Freq: Four times a day (QID) | ORAL | Status: DC | PRN

## 2020-04-13 MED ORDER — PREDNISONE 10 MG PO TABS: 10.0000 mg | ORAL_TABLET | Freq: Every day | ORAL | Status: DC

## 2020-04-13 MED ORDER — INSULIN ASPART 100 UNIT/ML ~~LOC~~ SOLN
0.0000 [IU] | Freq: Three times a day (TID) | SUBCUTANEOUS | Status: DC
  Administered 2020-04-14 (×3): 2 [IU] via SUBCUTANEOUS
  Administered 2020-04-15: 5 [IU] via SUBCUTANEOUS
  Administered 2020-04-15: 3 [IU] via SUBCUTANEOUS
  Administered 2020-04-15: 5 [IU] via SUBCUTANEOUS
  Administered 2020-04-16: 3 [IU] via SUBCUTANEOUS

## 2020-04-13 MED ORDER — ACETAMINOPHEN 325 MG PO TABS
650.0000 mg | ORAL_TABLET | Freq: Four times a day (QID) | ORAL | Status: DC | PRN
  Administered 2020-04-15 – 2020-04-16 (×2): 650 mg via ORAL
  Filled 2020-04-13 (×2): qty 2

## 2020-04-13 MED ORDER — PREDNISONE 20 MG PO TABS
20.0000 mg | ORAL_TABLET | Freq: Every day | ORAL | Status: DC
  Administered 2020-04-14: 20 mg via ORAL
  Filled 2020-04-13: qty 1

## 2020-04-13 MED ORDER — IPRATROPIUM-ALBUTEROL 0.5-2.5 (3) MG/3ML IN SOLN: 3.0000 mL | RESPIRATORY_TRACT | Status: DC | PRN

## 2020-04-13 MED ORDER — NICOTINE 14 MG/24HR TD PT24
14.0000 mg | MEDICATED_PATCH | Freq: Every day | TRANSDERMAL | Status: DC
  Administered 2020-04-14 – 2020-04-16 (×3): 14 mg via TRANSDERMAL
  Filled 2020-04-13 (×3): qty 1

## 2020-04-13 MED ORDER — IPRATROPIUM BROMIDE 0.02 % IN SOLN: 0.5000 mg | Freq: Three times a day (TID) | RESPIRATORY_TRACT | Status: DC

## 2020-04-13 MED ORDER — ONDANSETRON HCL 4 MG/2ML IJ SOLN: 4.0000 mg | Freq: Four times a day (QID) | INTRAMUSCULAR | Status: DC | PRN

## 2020-04-13 MED ORDER — ACETAMINOPHEN 650 MG RE SUPP: 650.0000 mg | Freq: Four times a day (QID) | RECTAL | Status: DC | PRN

## 2020-04-13 MED ORDER — IPRATROPIUM BROMIDE 0.02 % IN SOLN
0.5000 mg | Freq: Once | RESPIRATORY_TRACT | Status: AC
  Administered 2020-04-13: 0.5 mg via RESPIRATORY_TRACT
  Filled 2020-04-13: qty 2.5

## 2020-04-13 NOTE — ED Notes (Signed)
Admitting MD at bedside.

## 2020-04-13 NOTE — ED Notes (Signed)
Pt removed from 02 by Dr. Madilyn Hook.  Will monitor 02 sats

## 2020-04-13 NOTE — Discharge Instructions (Addendum)
Chronic Obstructive Pulmonary Disease Exacerbation Chronic obstructive pulmonary disease (COPD) is a long-term (chronic) lung problem. In COPD, the flow of air from the lungs is limited. COPD exacerbations are times that breathing gets worse and you need more than your normal treatment. Without treatment, they can be life threatening. If they happen often, your lungs can become more damaged. If your COPD gets worse, your doctor may treat you with:  Medicines.  Oxygen.  Different ways to clear your airway, such as using a mask. Follow these instructions at home: Medicines  Take over-the-counter and prescription medicines only as told by your doctor.  If you take an antibiotic or steroid medicine, do not stop taking the medicine even if you start to feel better.  Keep up with shots (vaccinations) as told by your doctor. Be sure to get a yearly (annual) flu shot. Lifestyle  Do not smoke. If you need help quitting, ask your doctor.  Eat healthy foods.  Exercise regularly.  Get plenty of sleep.  Avoid tobacco smoke and other things that can bother your lungs.  Wash your hands often with soap and water. This will help keep you from getting an infection. If you cannot use soap and water, use hand sanitizer.  During flu season, avoid areas that are crowded with people. General instructions  Drink enough fluid to keep your pee (urine) clear or pale yellow. Do not do this if your doctor has told you not to.  Use a cool mist machine (vaporizer).  If you use oxygen or a machine that turns medicine into a mist (nebulizer), continue to use it as told.  Follow all instructions for rehabilitation. These are steps you can take to make your body work better.  Keep all follow-up visits as told by your doctor. This is important. Contact a doctor if:  Your COPD symptoms get worse than normal. Get help right away if:  You are short of breath and it gets worse.  You have trouble  talking.  You have chest pain.  You cough up blood.  You have a fever.  You keep throwing up (vomiting).  You feel weak or you pass out (faint).  You feel confused.  You are not able to sleep because of your symptoms.  You are not able to do daily activities. Summary  COPD exacerbations are times that breathing gets worse and you need more treatment than normal.  COPD exacerbations can be very serious and may cause your lungs to become more damaged.  Do not smoke. If you need help quitting, ask your doctor.  Stay up-to-date on your shots. Get a flu shot every year. This information is not intended to replace advice given to you by your health care provider. Make sure you discuss any questions you have with your health care provider. Document Revised: 08/21/2017 Document Reviewed: 10/13/2016 Elsevier Patient Education  2020 [place] With Heart Failure  Heart failure is a long-term (chronic) condition in which the heart cannot pump enough blood through the body. When this happens, parts of the body do not get the blood and oxygen they need. There is no cure for heart failure at this time, so it is important for you to take good care of yourself and follow the treatment plan set by your health care provider. If you are living with heart failure, there are ways to help you manage the disease. Follow these instructions at home: Living with heart failure requires you to make changes in  your life. Your health care team will teach you about the changes you need to make in order to relieve your symptoms and lower your risk of going to the hospital. Follow the treatment plan as set by your health care provider. Medicines Medicines are important in reducing your heart's workload, slowing the progression of heart failure, and improving your symptoms.  Take over-the-counter and prescription medicines only as told by your health care provider.  Do not stop taking your medicine  unless your health care provider tells you to do that.  Do not skip any dose of your medicine.  Refill prescriptions before you run out of medicine. You need your medicines every day. Eating and drinking   Eat heart-healthy foods. Talk with a dietitian to make an eating plan that is right for you. ? If directed by your health care provider:  Limit salt (sodium). Lowering your sodium intake may reduce symptoms of heart failure. Ask a dietitian to recommend heart-healthy seasonings.  Limit your fluid intake. Fluid restriction may reduce symptoms of heart failure. ? Use low-fat cooking methods instead of frying. Low-fat methods include roasting, grilling, broiling, baking, poaching, steaming, and stir-frying. ? Choose foods that contain no trans fat and are low in saturated fat and cholesterol. Healthy choices include fresh or frozen fruits and vegetables, fish, lean meats, legumes, fat-free or low-fat dairy products, and whole-grain or high-fiber foods.  Limit alcohol intake to no more than 1 drink a day for nonpregnant women and 2 drinks a day for men. One drink equals 12 oz of beer, 5 oz of wine, or 1 oz of hard liquor. ? Drinking more than that is harmful to your heart. Tell your health care provider if you drink alcohol several times a week. ? Talk with your health care provider about whether any level of alcohol use is safe for you. Activity   Ask your health care provider about attending cardiac rehabilitation. These programs include aerobic physical activity, which provides many benefits for your heart.  If no cardiac rehabilitation program is available, ask your health care provider what aerobic exercises are safe for you to do. Lifestyle Make the lifestyle changes recommended by your health care provider. In general:  Lose weight if your health care provider tells you to do that. Weight loss may reduce symptoms of heart failure.  Do not use any products that contain nicotine or  tobacco, such as cigarettes or e-cigarettes. If you need help quitting, ask your health care provider.  Do not use street (illegal) drugs.  Return to your normal activities as told by your health care provider. Ask your health care provider what activities are safe for you. General instructions   Make sure you weigh yourself every day to track your weight. Rapid weight gain may indicate an increase in fluid in your body and may increase the workload of your heart. ? Weigh yourself every morning. Do this after you urinate but before you eat breakfast. ? Wear the same type of clothing, without shoes, each time you weigh yourself. ? Weigh yourself on the same scale and in the same spot each time.  Living with chronic heart failure often leads to emotions such as fear, stress, anxiety, and depression. If you feel any of these emotions and need help coping, contact your health care provider. Other ways to get help include: ? Talking to friends and family members about your condition. They can give you support and guidance. Explain your symptoms to them and, if comfortable,  invite them to attend appointments or rehabilitation with you. ? Joining a support group for people with chronic heart failure. Talking with other people who have the same symptoms may give you new ways of coping with your disease and your emotions.  Stay up to date with your shots (vaccines). Staying current on pneumococcal and influenza vaccines is especially important in preventing germs from attacking your airways (respiratory infections).  Keep all follow-up visits as told by your health care provider. This is important. How to recognize changes in your condition You and your family members need to know what changes to watch for in your condition. Watch for the following changes and report them to your health care provider:  Sudden weight gain. Ask your health care provider what amount of weight gain to report.  Shortness  of breath: ? Feeling short of breath while at rest, with no exercise or activity that required great effort. ? Feeling breathless with activity.  Swelling of your lower legs or ankles.  Difficulty sleeping: ? You wake up feeling short of breath. ? You have to use more pillows to raise your head in order to sleep.  Frequent, dry, hacking cough.  Loss of appetite.  Feeling more tired all the time.  Depression or feelings of sadness or hopelessness.  Bloating in the stomach. Where to find more information  Local support groups. Ask your health care provider about groups near you.  The American Heart Association: www.heart.org Contact a health care provider if:  You have a rapid weight gain.  You have increasing shortness of breath that is unusual for you.  You are unable to participate in your usual physical activities.  You tire easily.  You cough more than normal, especially with physical activity.  You have any swelling or more swelling in areas such as your hands, feet, ankles, or abdomen.  You feel like your heart is beating quickly (palpitations).  You become dizzy or light-headed when you stand up. Get help right away if:  You have difficulty breathing.  You notice or your family notices a change in your awareness, such as having trouble staying awake or having difficulty with concentration.  You have pain or discomfort in your chest.  You have an episode of fainting (syncope). Summary  There is no cure for heart failure, so it is important for you to take good care of yourself and follow the treatment plan set by your health care provider.  Medicines are important in reducing your heart's workload, slowing the progression of heart failure, and improving your symptoms.  Living with chronic heart failure often leads to emotions such as fear, stress, anxiety, and depression. If you are feeling any of these emotions and need help coping, contact your health  care provider. This information is not intended to replace advice given to you by your health care provider. Make sure you discuss any questions you have with your health care provider. Document Revised: 08/21/2017 Document Reviewed: 01/21/2017 Elsevier Patient Education  2020 [place].  Heart Failure and Exercise Heart failure is a condition in which the heart does not fill or pump enough blood and oxygen to support your body and its functions. Heart failure is a long-term (chronic) condition. Living with heart failure can be challenging. However, following your health care provider's instructions about a healthy lifestyle may help improve your symptoms. This includes choosing the right exercise plan. Doing daily physical activity is important after a diagnosis of heart failure. You may have some  activity restrictions, so talk to your health care provider before doing any exercises. What are the benefits of exercise? Exercise may:  Make your heart muscles stronger.  Lower your blood pressure.  Lower your cholesterol.  Help you lose weight.  Help your bones stay strong.  Improve your blood circulation.  Help your body use oxygen better. This relieves symptoms such as fatigue and shortness of breath.  Help your mental health by lowering the risk of depression and other problems.  Improve your quality of life.  Decrease your chance of hospital admission for heart failure. What is an exercise plan? An exercise plan is a set of specific exercises and training activities. You will work with your health care provider to create the exercise plan that works for you. The plan may include:  Different types of exercises and how to do them.  Cardiac rehabilitation exercises. These are supervised programs that are designed to strengthen your heart. What are strengthening exercises? Strengthening exercises are a type of physical activity that involves using resistance to improve your  muscle strength. Strengthening exercises usually have repetitive motions. These types of exercises can include:  Lifting weights.  Using weight machines.  Using resistance tubes and bands.  Using kettlebells.  Using your body weight, such as doing push-ups or squats. What are balance exercises? Balance exercises are another type of physical activity. They strengthen the muscles of the back, stomach, and pelvis (core muscles) and improve your balance. They can also lower your risk of falling. These types of exercises can include:  Standing on one leg.  Walking backward, sideways, and in a straight line.  Standing up after sitting, without using your hands.  Shifting your weight from one leg to the other.  Lifting one leg in front of you.  Doing tai chi. This is a type of exercise that uses slow movements and deep breathing. How can I increase my flexibility? Having better flexibility can keep you from falling. It can also lengthen your muscles, improve your range of motion, and help your joints. You can increase your flexibility by:  Doing tai chi.  Doing yoga.  Stretching. How much aerobic exercise should I get?  Aerobic exercises strengthen your breathing and circulation system and increase your body's use of oxygen. Examples of aerobic exercise include biking, walking, running, and swimming. Talk to your health care provider to find out how much aerobic exercise is safe for you.  To do these exercises:  Start exercising slowly, limiting the amount of time at first. You may need to start with 5 minutes of aerobic exercise every day.  Slowly add more minutes until you can safely do at least 30 minutes of exercise at least 4 days a week. Summary  Daily physical activity is important after a diagnosis of heart failure.  Exercise can make your heart muscles stronger. It also offers other benefits that will improve your health.  Talk to your health care provider before  doing any exercises. This information is not intended to replace advice given to you by your health care provider. Make sure you discuss any questions you have with your health care provider. Document Revised: 01/23/2017 Document Reviewed: 01/20/2017 Elsevier Patient Education  [employer]. It was our pleasure to provide your ER care today - we hope that you feel better.  Use your nebulizer treatments every 3-4 hours as need. Take prednisone as prescribed.   Avoid any smoking, or smoking environments.   Follow up with your doctor in  the coming week.  Return to ER if worse, new symptoms, fevers, increased trouble breathing, chest pain, or other concern.   Information on my medicine - ELIQUIS (apixaban)  This medication education was reviewed with me or my healthcare representative as part of my discharge preparation.  The pharmacist that spoke with me during my hospital stay was:  Onnie Boer, RPH-CPP  Why was Eliquis prescribed for you? Eliquis was prescribed for you to reduce the risk of a blood clot forming that can cause a stroke if you have a medical condition called atrial fibrillation (a type of irregular heartbeat).  What do You need to know about Eliquis ? Take your Eliquis TWICE DAILY - one tablet in the morning and one tablet in the evening with or without food. If you have difficulty swallowing the tablet whole please discuss with your pharmacist how to take the medication safely.  Take Eliquis exactly as prescribed by your doctor and DO NOT stop taking Eliquis without talking to the doctor who prescribed the medication.  Stopping may increase your risk of developing a stroke.  Refill your prescription before you run out.  After discharge, you should have regular check-up appointments with your healthcare provider that is prescribing your Eliquis.  In the future your dose may need to be changed if your kidney function or weight changes by a significant amount or as  you get older.  What do you do if you miss a dose? If you miss a dose, take it as soon as you remember on the same day and resume taking twice daily.  Do not take more than one dose of ELIQUIS at the same time to make up a missed dose.  Important Safety Information A possible side effect of Eliquis is bleeding. You should call your healthcare provider right away if you experience any of the following: ? Bleeding from an injury or your nose that does not stop. ? Unusual colored urine (red or dark brown) or unusual colored stools (red or black). ? Unusual bruising for unknown reasons. ? A serious fall or if you hit your head (even if there is no bleeding).  Some medicines may interact with Eliquis and might increase your risk of bleeding or clotting while on Eliquis. To help avoid this, consult your healthcare provider or pharmacist prior to using any new prescription or non-prescription medications, including herbals, vitamins, non-steroidal anti-inflammatory drugs (NSAIDs) and supplements.  This website has more information on Eliquis (apixaban): http://www.eliquis.com/eliquis/home

## 2020-04-13 NOTE — ED Notes (Signed)
Pt resting with eyes closed.  Pt on R/A  02 sats 90%

## 2020-04-13 NOTE — ED Notes (Signed)
Pt's 02 sats 88% on R/A.  Pt placed back on 02 via [place] at 2LPM.  Dr. Madilyn Hook made aware

## 2020-04-13 NOTE — ED Notes (Signed)
Pt given turkey sandwich and diet sprite 

## 2020-04-13 NOTE — ED Provider Notes (Signed)
Pt care assumed at 1530. Patient with history of COPD recently admitted for COPD exacerbation and discharged home yesterday. Patient with increased work of breathing today. BMP and reassessment after treatment pending.  BMP without significant abnormality. Patient reassessed after multiple nebs at 1545. He does have decreased air movement bilaterally with occasional expiratory wheezes. He does have mild increased work of breathing. He states that he does feel improved on recheck. Plan to observe and reassess.   1645, patient reassess. He is setting 88% on room air. He was placed on supplemental oxygen. After placement on supplemental oxygen patient with improved work of breathing. Lung exam with persistent diminished air movement bilaterally with end expiratory wheezes. Given hypoxia, lack of access home oxygen plan to admit for further treatment. Pt in agreement with treatment plan.  Medicine consulted for admission.     Tilden Fossa, MD 04/13/20 [phone]

## 2020-04-13 NOTE — H&P (Signed)
History and Physical    [name] MCN:[id number] DOB: December 19, 1960 DOA: 04/13/2020  PCP: [name], MD  Patient coming from: Home  I have personally briefly reviewed patient's old medical records in [place]  Chief Complaint: Shortness of breath  HPI: Kenneth [name] is a 59 y.o. male with medical history significant of hypertension, hyperlipidemia, diabetes mellitus, paroxysmal A. fib on Eliquis, chronic combined systolic and diastolic heart failure, COPD-not on home oxygen, CKD stage III, obstructive sleep apnea-on CPAP at home presents to emergency department with worsening shortness of breath since this morning.  Patient reports worsening shortness of breath associated with wheezing and cough.  He tried breathing treatment couple of times at home with no help.  He started having panic attacks and anxiety due to difficulty in breathing therefore he called EMS and came to the emergency department for further evaluation and management.  Reports headache and chest pain due to acid reflux which resolves with PPI.  Denies blurry vision, lightheadedness, dizziness, leg swelling, orthopnea, PND, nausea, vomiting, diarrhea, abdominal pain, urinary or bowel changes.  He continues to smoke 7 to 8 cigarettes/day, drinks 2-3 beers per week and uses marijuana every other day to help with anxiety and appetite.  He tells me that he is compliant with his home medications including Eliquis.  Of note: Patient admitted on 04/06/2020 with similar symptoms.  And discharged on 04/08/2020 on tapering dose of prednisone.  He tells me that he has been compliant with prednisone as prescribed.  He has follow-up appointment with pulmonary on August 11.  ED Course: Upon arrival to ED: Patient's vital signs stable, requiring 2 L of oxygen via nasal cannula, afebrile with no leukocytosis.  Initial lab status CBC, CMP: WNL, COVID-19 pending.  Chest x-ray negative for acute findings.  Patient received  albuterol and duo nebs x2 treatment in ED.  His oxygen saturation dropped and 88% on room air therefore he placed back on 2 L of oxygen via nasal cannula.  [facility] hospitalist consulted for admission for COPD exacerbation.  Review of Systems: As per HPI otherwise negative.    Past Medical History:  Diagnosis Date   Arthritis    Asthma    Atrial fibrillation (HCC)    CHF (congestive heart failure) ([place])    Depression    Diabetes mellitus without complication ([place])    Emphysema of lung ([place])    Heart murmur    Hyperlipidemia    Hypertension     History reviewed. No pertinent surgical history.   reports that he has been smoking cigarettes. He has a 60.00 pack-year smoking history. He has never used smokeless tobacco. He reports current alcohol use. He reports current drug use. Drug: Marijuana.  Allergies  Allergen Reactions   Lisinopril Swelling   Other Other (See Comments)    Lettuce : rash   Tomato Rash    Family History  Problem Relation Age of Onset   Diabetes Sister    Coronary artery disease Brother     Prior to Admission medications   Medication Sig Start Date End Date Taking? Authorizing Provider  acetaminophen (TYLENOL) 325 MG tablet Take 650 mg by mouth every 6 (six) hours as needed for mild pain or headache.    [provider]  albuterol (PROVENTIL HFA;VENTOLIN HFA) 108 (90 Base) MCG/ACT inhaler Inhale 1-2 puffs into the lungs every 4 (four) hours as needed for wheezing or shortness of breath.  06/19/16   [provider]  albuterol (PROVENTIL) (2.5 MG/3ML) 0.083%  nebulizer solution Take 3 mLs (2.5 mg total) by nebulization 5 (five) times daily as needed for wheezing or shortness of breath. 03/06/20   [name], MD  amLODipine (NORVASC) 5 MG tablet TAKE 1 TABLET BY MOUTH  DAILY Patient taking differently: Take 5 mg by mouth daily.  04/02/20   [name], MD  apixaban (ELIQUIS) 5 MG TABS tablet Take 1 tablet (5 mg  total) by mouth 2 (two) times daily. 03/20/20   Lendon Colonel, NP  aspirin EC 81 MG tablet Take 81 mg by mouth daily.    [provider]  Blood Glucose Monitoring Suppl (ONE TOUCH ULTRA 2) w/Device KIT Use to test blood sugars 1-2 times daily. 04/02/20   [name], MD  busPIRone (BUSPAR) 15 MG tablet Take 1 tablet (15 mg total) by mouth 2 (two) times daily. 09/06/19   [name], MD  carvedilol (COREG) 12.5 MG tablet Take 1 tablet (12.5 mg total) by mouth 2 (two) times daily with a meal. 03/20/20   Lendon Colonel, NP  diphenhydrAMINE (BENADRYL) 25 MG tablet Take 50 mg by mouth every 6 (six) hours as needed for itching or allergies.    [provider]  famotidine (PEPCID) 20 MG tablet TAKE 1 TABLET BY MOUTH  DAILY Patient taking differently: Take 20 mg by mouth daily.  03/23/20   [name], MD  FLUoxetine (PROZAC) 20 MG capsule TAKE 3 CAPSULES BY MOUTH  DAILY Patient taking differently: Take 60 mg by mouth daily.  05/26/19   [name], MD  Fluticasone-Umeclidin-Vilant (TRELEGY ELLIPTA) 100-62.5-25 MCG/INH AEPB Inhale 1 puff into the lungs daily. 04/02/20   Collene Gobble, MD  furosemide (LASIX) 20 MG tablet TAKE 1 TABLET BY MOUTH  DAILY Patient taking differently: Take 20 mg by mouth daily.  04/02/20   [name], MD  glucose blood [facility] ULTRA) test strip Use to test blood sugars 1-2 times daily. 05/20/19   [name], MD  ipratropium (ATROVENT) 0.02 % nebulizer solution Take 2.5 mLs (0.5 mg total) by nebulization 4 (four) times daily. 08/05/19   Law, Bea Graff, PA-C  isosorbide mononitrate (IMDUR) 30 MG 24 hr tablet TAKE 1 TABLET BY MOUTH EVERY DAY Patient taking differently: Take 30 mg by mouth daily.  04/02/20   [name], MD  Lancets [facility] ULTRASOFT) lancets Use to test blood sugars 1-2 times daily. 05/20/19   [name], MD  losartan (COZAAR) 25 MG tablet Take 1 tablet  (25 mg total) by mouth daily. Please schedule annual appt with Dr. Harrell Gave for refills. [phone]. 3rd attempt. Patient taking differently: Take 25 mg by mouth daily.  01/31/20   Buford Dresser, MD  lubiprostone ([name]) 8 MCG capsule Take 1 capsule (8 mcg total) by mouth 2 (two) times daily with a meal. 03/22/20   Nche, Charlene Brooke, NP  metFORMIN (GLUCOPHAGE) 500 MG tablet TAKE 1 TABLET BY MOUTH  TWICE DAILY WITH MEALS Patient taking differently: Take 500 mg by mouth 2 (two) times daily with a meal.  04/02/20   [name], MD  Multiple Vitamin (MULTIVITAMIN WITH MINERALS) TABS tablet Take 1 tablet by mouth daily.    [provider]  nitroGLYCERIN (NITROSTAT) 0.4 MG SL tablet DISSOLVE 1 TABLET UNDER THE TONGUE EVERY 5 MINUTES AS  NEEDED FOR CHEST PAIN. MAX  OF 3 TABLETS IN 15 MINUTES. CALL 911 IF PAIN PERSISTS. Patient taking differently: Place 0.4 mg under the tongue every 5 (five)  minutes as needed for chest pain.  06/29/19   [name], MD  pantoprazole (PROTONIX) 20 MG tablet Take 1 tablet (20 mg total) by mouth daily. 03/20/20   Lendon Colonel, NP  predniSONE (DELTASONE) 10 MG tablet 40 mg x 2 days then 30 mg x 2 days then 20 mg x 2 days then 10 mg x 2 days then stop 04/08/20   Eulogio Bear U, DO  traZODone (DESYREL) 50 MG tablet TAKE 1/2 TO 1 TABLET BY  MOUTH AT BEDTIME AS NEEDED  FOR SLEEP. Patient taking differently: Take 50 mg by mouth at bedtime as needed for sleep.  05/26/19   [name], MD    Physical Exam: Vitals:   04/13/20 1423 04/13/20 1424  BP: (!) 152/75   Pulse: 76   Resp: 22   Temp: 98.5 F (36.9 C)   TempSrc: Oral   SpO2: 100%   Weight:  77.1 kg  Height:  6' (1.829 m)    Constitutional: NAD, calm, comfortable, on 2 L of oxygen via nasal cannula, communicating well Eyes: PERRL, lids and conjunctivae normal ENMT: Mucous membranes are moist. Posterior pharynx clear of any exudate or lesions.Normal dentition.   Neck: normal, supple, no masses, no thyromegaly Respiratory: No wheezing, rhonchi or crackles noted. Cardiovascular: Regular rate and rhythm, no murmurs / rubs / gallops. No extremity edema. 2+ pedal pulses. No carotid bruits.  Abdomen: no tenderness, no masses palpated. No hepatosplenomegaly. Bowel sounds positive.  Musculoskeletal: no clubbing / cyanosis. No joint deformity upper and lower extremities. Good ROM, no contractures. Normal muscle tone.  Skin: no rashes, lesions, ulcers. No induration Neurologic: CN 2-12 grossly intact. Sensation intact, DTR normal. Strength 5/5 in all 4.  Psychiatric: Normal judgment and insight. Alert and oriented x 3. Normal mood.    Labs on Admission: I have personally reviewed following labs and imaging studies  CBC: Recent Labs  Lab 04/06/20 2011 04/06/20 2046 04/07/20 0314 04/13/20 1433  WBC 6.2  --  5.0 8.8  NEUTROABS 2.8  --   --   --   HGB 13.2 14.6 13.6 12.2*  HCT 42.6 43.0 41.9 38.9*  MCV 98.4  --  96.1 97.3  PLT 336  --  284 376   Basic Metabolic Panel: Recent Labs  Lab 04/06/20 2011 04/06/20 2046 04/07/20 0314 04/13/20 1433  NA 138 138 138 136  K 3.8 3.9 3.7 3.9  CL 100  --  98 95*  CO2 27  --  28 25  GLUCOSE 205*  --  198* 199*  BUN 5*  --  6 16  CREATININE 1.38*  --  1.32* 1.15  CALCIUM 8.9  --  9.4 9.1   GFR: Estimated Creatinine Clearance: 76.4 mL/min (by C-G formula based on SCr of 1.15 mg/dL). Liver Function Tests: Recent Labs  Lab 04/06/20 2011 04/13/20 1433  AST 26 37  ALT 17 41  ALKPHOS 74 50  BILITOT <0.1* 0.5  PROT 6.2* 6.1*  ALBUMIN 3.6 3.5   No results for input(s): LIPASE, AMYLASE in the last 168 hours. No results for input(s): AMMONIA in the last 168 hours. Coagulation Profile: No results for input(s): INR, PROTIME in the last 168 hours. Cardiac Enzymes: No results for input(s): CKTOTAL, CKMB, CKMBINDEX, TROPONINI in the last 168 hours. BNP (last 3 results) No results for input(s): PROBNP in  the last 8760 hours. HbA1C: No results for input(s): HGBA1C in the last 72 hours. CBG: Recent Labs  Lab 04/07/20 [phone] 04/07/20 1154  04/07/20 1621 04/07/20 2128 04/08/20 0739  GLUCAP 209* 166* 117* 138* 92   Lipid Profile: No results for input(s): CHOL, HDL, LDLCALC, TRIG, CHOLHDL, LDLDIRECT in the last 72 hours. Thyroid Function Tests: No results for input(s): TSH, T4TOTAL, FREET4, T3FREE, THYROIDAB in the last 72 hours. Anemia Panel: No results for input(s): VITAMINB12, FOLATE, FERRITIN, TIBC, IRON, RETICCTPCT in the last 72 hours. Urine analysis:    Component Value Date/Time   COLORURINE YELLOW 05/20/2019 0825   APPEARANCEUR CLEAR 05/20/2019 0825   LABSPEC 1.020 05/20/2019 0825   PHURINE 6.0 05/20/2019 0825   GLUCOSEU NEGATIVE 05/20/2019 0825   HGBUR NEGATIVE 05/20/2019 0825   BILIRUBINUR SMALL (A) 05/20/2019 0825   KETONESUR TRACE (A) 05/20/2019 0825   UROBILINOGEN 1.0 05/20/2019 0825   NITRITE NEGATIVE 05/20/2019 0825   LEUKOCYTESUR NEGATIVE 05/20/2019 0825    Radiological Exams on Admission: DG Chest Port 1 View  Result Date: 04/13/2020 CLINICAL DATA:  Shortness of breath. EXAM: PORTABLE CHEST 1 VIEW COMPARISON:  April 06, 2020 FINDINGS: There is no evidence of acute infiltrate, pleural effusion or pneumothorax. The heart size and mediastinal contours are within normal limits. There is moderate severity calcification of the aortic arch. The visualized skeletal structures are unremarkable. IMPRESSION: No active disease. Electronically Signed   By: Virgina Norfolk M.D.   On: 04/13/2020 15:02    EKG: Independently reviewed.  Sinus rhythm.  No ST elevation or depression noted.  Assessment/Plan Active Problems:   Essential hypertension   Chronic obstructive pulmonary disease (HCC)   Diabetes mellitus without complication (HCC)   Depression   PAF (paroxysmal atrial fibrillation) (HCC)   Chronic combined systolic and diastolic heart failure (HCC)   Gastroesophageal  reflux disease without esophagitis   Acute respiratory failure with hypoxia (HCC)   Hyperlipidemia    Acute hypoxemic respiratory failure: -Secondary to underlying COPD with ongoing tobacco abuse.  He is currently requiring 2 L of oxygen via nasal cannula.  He is afebrile with no leukocytosis.  COVID-19 pending.  Chest x-ray negative for acute findings. -Admit patient on the floor.  On continuous pulse ox.  We will try to wean off of oxygen as tolerated. -DuoNebs PRN and will continue tapering dose of prednisone -Will add azithromycin for 3 days. -Incentive spirometry.  Hypertension: Stable -Continue home meds-amlodipine, Coreg, Imdur, losartan -Monitor blood pressure closely  Paroxysmal A. fib: Rate controlled -Reviewed EKG.  Continue Eliquis and Coreg.  Type 2 diabetes mellitus: Hold Metformin -Started patient on sliding scale insulin and monitor blood sugar closely.  Chronic combined systolic and diastolic CHF: Patient appears euvolemic.  Chest x-ray negative for acute findings.  Reviewed echo from 04/07/2020 which showed ejection fraction of 45 to 50%. -Continue aspirin, statin, Coreg, ARB, Lasix -Strict INO's and daily weight.  Check magnesium level.  Monitor signs of fluid overload.  GERD: Continue PPI  Depression/anxiety: Continue Prozac, BuSpar and trazodone  Tobacco abuse: Counseled about cessation and he verbalized understanding.  Start on nicotine patch.  Marijuana abuse: Counseled about cessation and he verbalized understanding.  Unable to safely start patient's home medication as med reconciliation is pending by pharmacy.  DVT prophylaxis: Eliquis/SCD Code Status: Full code Family Communication: None present at bedside.  Plan of care discussed with patient in length and he verbalized understanding and agreed with it. Disposition Plan: Likely home in 1 to 2 days Consults called: None Admission status: Inpatient   Mckinley Jewel MD [facility]  If  7PM-7AM, please contact night-coverage [email or web address] Password [facility] [address]  04/13/2020,  5:24 PM

## 2020-04-13 NOTE — ED Provider Notes (Addendum)
[facility] EMERGENCY DEPARTMENT Provider Note   CSN: [id number] Arrival date & time: 04/13/20  1415     History Chief Complaint  Patient presents with  . Shortness of Breath    Olney Monier is a 59 y.o. male.  Patient with hx copd, presents via EMS with increased wheezing and sob in past day. Patient indicates normally uses frequent neb txs, but states last night he slept along time, and missed his normal treatments, and feels that may have resulted in his increased wheezing. States recent hospitalization for same, is on tapering dose of prednisone. +smoker, continues to smoke. Denies increased cough or other uri symptoms. covid test 7 days ago neg. Denies any current or recent chest pain or discomfort.  No leg pain or swelling. States compliant w home meds. EMS has given albuterol and atrovent neb, solumedrol iv, and Mg 2 gm iv, with improvement in symptoms.   The history is provided by the patient and the EMS personnel.       Past Medical History:  Diagnosis Date  . Arthritis   . Asthma   . Atrial fibrillation ([place])   . CHF (congestive heart failure) ([place])   . Depression   . Diabetes mellitus without complication ([place])   . Emphysema of lung ([place])   . Heart murmur   . Hyperlipidemia   . Hypertension     Patient Active Problem List   Diagnosis Date Noted  . COPD with acute exacerbation ([place]) 04/07/2020  . Acute on chronic combined systolic and diastolic CHF (congestive heart failure) ([place]) 04/07/2020  . Acute respiratory failure with hypoxia ([place]) 04/06/2020  . Acute on chronic respiratory failure with hypoxemia ([place]) 02/08/2020  . Chronic constipation 07/19/2019  . Moderate persistent asthma with acute exacerbation 06/17/2019  . Hospital discharge follow-up 06/17/2019  . Shortness of breath 06/12/2019  . Chest tightness 06/12/2019  . Snoring 03/29/2019  . Insomnia due to other mental disorder 11/15/2018  . Anticoagulant long-term use 10/21/2018   . Gastroesophageal reflux disease without esophagitis 08/10/2018  . Tobacco abuse 08/10/2018  . Chest pain 07/27/2018  . PAF (paroxysmal atrial fibrillation) ([place]) 07/27/2018  . Mild renal insufficiency 07/27/2018  . Chronic combined systolic and diastolic heart failure ([place]) 07/27/2018  . Slow transit constipation 07/20/2018  . Essential hypertension 07/06/2018  . Chronic obstructive pulmonary disease ([place]) 07/06/2018  . Controlled type 2 diabetes mellitus without complication, without long-term current use of insulin ([place]) 07/06/2018  . Depression with anxiety 07/06/2018  . Anemia 07/06/2018  . Healthcare maintenance 07/06/2018  . Alcohol abuse 07/06/2018    History reviewed. No pertinent surgical history.     Family History  Problem Relation Age of Onset  . Diabetes Sister   . Coronary artery disease Brother     Social History   Tobacco Use  . Smoking status: Current Some Day Smoker    Packs/day: 2.00    Years: 30.00    Pack years: 60.00    Types: Cigarettes  . Smokeless tobacco: Never Used  . Tobacco comment: smokes 2-3cigs per day  Substance Use Topics  . Alcohol use: Yes    Comment: Drinks up to six beers around a game  . Drug use: Yes    Types: Marijuana    Comment: occ marijuana    Home Medications Prior to Admission medications   Medication Sig Start Date End Date Taking? Authorizing Provider  acetaminophen (TYLENOL) 325 MG tablet Take 650 mg by mouth every 6 (six) hours as needed  for mild pain or headache.    [provider]  albuterol (PROVENTIL HFA;VENTOLIN HFA) 108 (90 Base) MCG/ACT inhaler Inhale 1-2 puffs into the lungs every 4 (four) hours as needed for wheezing or shortness of breath.  06/19/16   [provider]  albuterol (PROVENTIL) (2.5 MG/3ML) 0.083% nebulizer solution Take 3 mLs (2.5 mg total) by nebulization 5 (five) times daily as needed for wheezing or shortness of breath. 03/06/20   [name], MD  amLODipine  (NORVASC) 5 MG tablet TAKE 1 TABLET BY MOUTH  DAILY Patient taking differently: Take 5 mg by mouth daily.  04/02/20   [name], MD  apixaban (ELIQUIS) 5 MG TABS tablet Take 1 tablet (5 mg total) by mouth 2 (two) times daily. 03/20/20   Lendon Colonel, NP  aspirin EC 81 MG tablet Take 81 mg by mouth daily.    [provider]  Blood Glucose Monitoring Suppl (ONE TOUCH ULTRA 2) w/Device KIT Use to test blood sugars 1-2 times daily. 04/02/20   [name], MD  busPIRone (BUSPAR) 15 MG tablet Take 1 tablet (15 mg total) by mouth 2 (two) times daily. 09/06/19   [name], MD  carvedilol (COREG) 12.5 MG tablet Take 1 tablet (12.5 mg total) by mouth 2 (two) times daily with a meal. 03/20/20   Lendon Colonel, NP  diphenhydrAMINE (BENADRYL) 25 MG tablet Take 50 mg by mouth every 6 (six) hours as needed for itching or allergies.    [provider]  famotidine (PEPCID) 20 MG tablet TAKE 1 TABLET BY MOUTH  DAILY Patient taking differently: Take 20 mg by mouth daily.  03/23/20   [name], MD  FLUoxetine (PROZAC) 20 MG capsule TAKE 3 CAPSULES BY MOUTH  DAILY Patient taking differently: Take 60 mg by mouth daily.  05/26/19   [name], MD  Fluticasone-Umeclidin-Vilant (TRELEGY ELLIPTA) 100-62.5-25 MCG/INH AEPB Inhale 1 puff into the lungs daily. 04/02/20   Collene Gobble, MD  furosemide (LASIX) 20 MG tablet TAKE 1 TABLET BY MOUTH  DAILY Patient taking differently: Take 20 mg by mouth daily.  04/02/20   [name], MD  glucose blood [facility] ULTRA) test strip Use to test blood sugars 1-2 times daily. 05/20/19   [name], MD  ipratropium (ATROVENT) 0.02 % nebulizer solution Take 2.5 mLs (0.5 mg total) by nebulization 4 (four) times daily. 08/05/19   Law, Bea Graff, PA-C  isosorbide mononitrate (IMDUR) 30 MG 24 hr tablet TAKE 1 TABLET BY MOUTH EVERY DAY Patient taking differently: Take 30 mg by mouth daily.   04/02/20   [name], MD  Lancets [facility] ULTRASOFT) lancets Use to test blood sugars 1-2 times daily. 05/20/19   [name], MD  losartan (COZAAR) 25 MG tablet Take 1 tablet (25 mg total) by mouth daily. Please schedule annual appt with Dr. Harrell Gave for refills. [phone]. 3rd attempt. Patient taking differently: Take 25 mg by mouth daily.  01/31/20   Buford Dresser, MD  lubiprostone ([name]) 8 MCG capsule Take 1 capsule (8 mcg total) by mouth 2 (two) times daily with a meal. 03/22/20   Nche, Charlene Brooke, NP  metFORMIN (GLUCOPHAGE) 500 MG tablet TAKE 1 TABLET BY MOUTH  TWICE DAILY WITH MEALS Patient taking differently: Take 500 mg by mouth 2 (two) times daily with a meal.  04/02/20   [name], MD  Multiple Vitamin (MULTIVITAMIN WITH MINERALS) TABS tablet Take 1 tablet by mouth daily.  [provider]  nitroGLYCERIN (NITROSTAT) 0.4 MG SL tablet DISSOLVE 1 TABLET UNDER THE TONGUE EVERY 5 MINUTES AS  NEEDED FOR CHEST PAIN. MAX  OF 3 TABLETS IN 15 MINUTES. CALL 911 IF PAIN PERSISTS. Patient taking differently: Place 0.4 mg under the tongue every 5 (five) minutes as needed for chest pain.  06/29/19   [name], MD  pantoprazole (PROTONIX) 20 MG tablet Take 1 tablet (20 mg total) by mouth daily. 03/20/20   Lendon Colonel, NP  predniSONE (DELTASONE) 10 MG tablet 40 mg x 2 days then 30 mg x 2 days then 20 mg x 2 days then 10 mg x 2 days then stop 04/08/20   Eulogio Bear U, DO  traZODone (DESYREL) 50 MG tablet TAKE 1/2 TO 1 TABLET BY  MOUTH AT BEDTIME AS NEEDED  FOR SLEEP. Patient taking differently: Take 50 mg by mouth at bedtime as needed for sleep.  05/26/19   [name], MD    Allergies    Lisinopril, Other, and Tomato  Review of Systems   Review of Systems  Constitutional: Negative for fever.  HENT: Negative for sore throat.   Eyes: Negative for visual disturbance.  Respiratory: Positive for shortness of  breath and wheezing.   Cardiovascular: Negative for chest pain, palpitations and leg swelling.  Gastrointestinal: Negative for abdominal pain and vomiting.  Endocrine: Negative for polyuria.  Genitourinary: Negative for flank pain.  Musculoskeletal: Negative for back pain and neck pain.  Skin: Negative for rash.  Neurological: Negative for headaches.  Hematological: Does not bruise/bleed easily.  Psychiatric/Behavioral: Negative for confusion.    Physical Exam Updated Vital Signs BP (!) 152/75 (BP Location: Right Arm)   Pulse 76   Temp 98.5 F (36.9 C) (Oral)   Resp 22   Ht 1.829 m (6')   Wt 77.1 kg   SpO2 100%   BMI 23.06 kg/m   Physical Exam Vitals and nursing note reviewed.  Constitutional:      Appearance: Normal appearance. He is well-developed.  HENT:     Head: Atraumatic.     Nose: Nose normal.     Mouth/Throat:     Mouth: Mucous membranes are moist.     Pharynx: Oropharynx is clear.  Eyes:     General: No scleral icterus.    Conjunctiva/sclera: Conjunctivae normal.  Neck:     Trachea: No tracheal deviation.  Cardiovascular:     Rate and Rhythm: Normal rate and regular rhythm.     Pulses: Normal pulses.     Heart sounds: Normal heart sounds. No murmur heard.  No friction rub. No gallop.   Pulmonary:     Effort: Pulmonary effort is normal. No accessory muscle usage or respiratory distress.     Breath sounds: Wheezing present.     Comments: Diminished breaths sounds bil.  Abdominal:     General: Bowel sounds are normal. There is no distension.     Palpations: Abdomen is soft.     Tenderness: There is no abdominal tenderness. There is no guarding.  Genitourinary:    Comments: No cva tenderness. Musculoskeletal:        General: No swelling or tenderness.     Cervical back: Normal range of motion and neck supple. No rigidity.     Right lower leg: No edema.     Left lower leg: No edema.  Skin:    General: Skin is warm and dry.     Findings: No rash.   Neurological:  Mental Status: He is alert.     Comments: Alert, speech clear.   Psychiatric:        Mood and Affect: Mood normal.     ED Results / Procedures / Treatments   Labs (all labs ordered are listed, but only abnormal results are displayed) Results for orders placed or performed during the hospital encounter of 04/13/20  CBC  Result Value Ref Range   WBC 8.8 4.0 - 10.5 K/uL   RBC 4.00 (L) 4.22 - 5.81 MIL/uL   Hemoglobin 12.2 (L) 13.0 - 17.0 g/dL   HCT 38.9 (L) 39 - 52 %   MCV 97.3 80.0 - 100.0 fL   MCH 30.5 26.0 - 34.0 pg   MCHC 31.4 30.0 - 36.0 g/dL   RDW 12.6 11.5 - 15.5 %   Platelets 279 150 - 400 K/uL   nRBC 0.0 0.0 - 0.2 %   DG Chest Port 1 View  Result Date: 04/06/2020 CLINICAL DATA:  Shortness of breath. EXAM: PORTABLE CHEST 1 VIEW COMPARISON:  March 08, 2020. FINDINGS: The heart size and mediastinal contours are within normal limits. Both lungs are clear. No pneumothorax or pleural effusion is noted. The visualized skeletal structures are unremarkable. IMPRESSION: No active disease. Aortic Atherosclerosis (ICD10-I70.0). Electronically Signed   By: Marijo Conception M.D.   On: 04/06/2020 20:21   ECHOCARDIOGRAM COMPLETE  Result Date: 04/07/2020    ECHOCARDIOGRAM REPORT   Patient Name:   LAWAYNE HARTIG Date of Exam: 04/07/2020 Medical Rec #:  [id number]    Height:       72.0 in Accession #:    [id number]   Weight:       166.2 lb Date of Birth:  02-Nov-1960   BSA:          1.969 m Patient Age:    54 years     BP:           165/89 mmHg Patient Gender: M            HR:           83 bpm. Exam Location:  Inpatient Procedure: 2D Echo, Cardiac Doppler and Color Doppler Indications:    CHF-Acute Systolic 497.02 / O37.85  History:        Patient has prior history of Echocardiogram examinations, most                 recent 06/13/2019. CHF, COPD, Arrythmias:Atrial Fibrillation,                 Signs/Symptoms:Chest Pain and Shortness of Breath; Risk                 Factors:Hypertension,  Diabetes and Current Smoker. GERD.  Sonographer:    Vickie Epley RDCS Referring Phys: [id number] [place] T TU IMPRESSIONS  1. LVEF is mildly depressed with inferior hypokinesis. Compared to previous echo, LVEF is improved. . Left ventricular ejection fraction, by estimation, is 45 to 50%. The left ventricle has mildly decreased function. The left ventricle demonstrates regional wall motion abnormalities (see scoring diagram/findings for description). The left ventricular internal cavity size was mildly dilated. Indeterminate diastolic filling due to E-A fusion.  2. Right ventricular systolic function is normal. The right ventricular size is normal. There is normal pulmonary artery systolic pressure.  3. The mitral valve is abnormal. Trivial mitral valve regurgitation.  4. The aortic valve is abnormal. Aortic valve regurgitation is mild. Mild to moderate aortic valve sclerosis/calcification is present, without any evidence of  aortic stenosis.  5. The inferior vena cava is normal in size with greater than 50% respiratory variability, suggesting right atrial pressure of 3 mmHg. FINDINGS  Left Ventricle: LVEF is mildly depressed with inferior hypokinesis. Compared to previous echo, LVEF is improved. Left ventricular ejection fraction, by estimation, is 45 to 50%. The left ventricle has mildly decreased function. The left ventricle demonstrates regional wall motion abnormalities. The left ventricular internal cavity size was mildly dilated. There is no left ventricular hypertrophy. Indeterminate diastolic filling due to E-A fusion. Right Ventricle: The right ventricular size is normal. Right vetricular wall thickness was not assessed. Right ventricular systolic function is normal. There is normal pulmonary artery systolic pressure. The tricuspid regurgitant velocity is 1.53 m/s, and with an assumed right atrial pressure of 3 mmHg, the estimated right ventricular systolic pressure is 73.5 mmHg. Left Atrium: Left atrial size was  normal in size. Right Atrium: Right atrial size was normal in size. Pericardium: There is no evidence of pericardial effusion. Mitral Valve: The mitral valve is abnormal. There is mild thickening of the mitral valve leaflet(s). Mild mitral annular calcification. Trivial mitral valve regurgitation. Tricuspid Valve: The tricuspid valve is normal in structure. Tricuspid valve regurgitation is trivial. Aortic Valve: The aortic valve is abnormal. Aortic valve regurgitation is mild. Mild to moderate aortic valve sclerosis/calcification is present, without any evidence of aortic stenosis. Pulmonic Valve: The pulmonic valve was not well visualized. Pulmonic valve regurgitation is not visualized. Aorta: The aortic root is normal in size and structure. Venous: The inferior vena cava is normal in size with greater than 50% respiratory variability, suggesting right atrial pressure of 3 mmHg. IAS/Shunts: No atrial level shunt detected by color flow Doppler.  LEFT VENTRICLE PLAX 2D LVIDd:         5.60 cm LVIDs:         4.50 cm LV PW:         0.90 cm LV IVS:        0.90 cm LVOT diam:     2.30 cm LV SV:         71 LV SV Index:   36 LVOT Area:     4.15 cm  LV Volumes (MOD) LV vol d, MOD A2C: 121.0 ml LV vol d, MOD A4C: 117.0 ml LV vol s, MOD A2C: 78.2 ml LV vol s, MOD A4C: 63.2 ml LV SV MOD A2C:     42.8 ml LV SV MOD A4C:     117.0 ml LV SV MOD BP:      48.5 ml RIGHT VENTRICLE RV S prime:     12.60 cm/s TAPSE (M-mode): 2.2 cm LEFT ATRIUM             Index       RIGHT ATRIUM           Index LA diam:        3.30 cm 1.68 cm/m  RA Area:     12.90 cm LA Vol (A2C):   37.6 ml 19.09 ml/m RA Volume:   28.00 ml  14.22 ml/m LA Vol (A4C):   25.7 ml 13.05 ml/m LA Biplane Vol: 31.0 ml 15.74 ml/m  AORTIC VALVE LVOT Vmax:   80.40 cm/s LVOT Vmean:  57.300 cm/s LVOT VTI:    0.171 m  AORTA Ao Root diam: 3.40 cm TRICUSPID VALVE TR Peak grad:   9.4 mmHg TR Vmax:        153.00 cm/s  SHUNTS Systemic VTI:  0.17 m Systemic Diam:  2.30 cm Dorris Carnes MD  Electronically signed by Dorris Carnes MD Signature Date/Time: 04/07/2020/10:37:08 AM    Final     EKG EKG Interpretation  Date/Time:  Friday April 13 2020 14:16:20 EDT Ventricular Rate:  76 PR Interval:    QRS Duration: 84 QT Interval:  414 QTC Calculation: 466 R Axis:   69 Text Interpretation: Sinus rhythm Borderline repolarization abnormality Confirmed by Quintella Reichert [phone]) on 04/13/2020 2:58:02 PM   Radiology DG Chest Port 1 View  Result Date: 04/13/2020 CLINICAL DATA:  Shortness of breath. EXAM: PORTABLE CHEST 1 VIEW COMPARISON:  April 06, 2020 FINDINGS: There is no evidence of acute infiltrate, pleural effusion or pneumothorax. The heart size and mediastinal contours are within normal limits. There is moderate severity calcification of the aortic arch. The visualized skeletal structures are unremarkable. IMPRESSION: No active disease. Electronically Signed   By: Virgina Norfolk M.D.   On: 04/13/2020 15:02    Procedures Procedures (including critical care time)  Medications Ordered in ED Medications  albuterol (PROVENTIL) (2.5 MG/3ML) 0.083% nebulizer solution 5 mg (5 mg Nebulization Given 04/13/20 1448)  ipratropium (ATROVENT) nebulizer solution 0.5 mg (0.5 mg Nebulization Given 04/13/20 1448)    ED Course  I have reviewed the triage vital signs and the nursing notes.  Pertinent labs & imaging results that were available during my care of the patient were reviewed by me and considered in my medical decision making (see chart for details).    MDM Rules/Calculators/A&P                         Albuterol and atrovent neb. Pcxr. Labs.   Reviewed nursing notes and prior charts for additional history. Recent hospitalization for similar symptoms reviewed.   Labs reviewed/interpreted by me - wbc normal.   Recheck, pt w improved air movement. Sl wheezing, but improved. Cxr, labs pending. Additional neb tx ordered.   CXR reviewed/interpreted by me - no pna.   Signed out to  Dr Ralene Bathe at 1500 to recheck post additional neb(s) and dispo appropriately.     Final Clinical Impression(s) / ED Diagnoses Final diagnoses:  None    Rx / DC Orders ED Discharge Orders    None           Lajean Saver, MD 04/13/20 1506

## 2020-04-13 NOTE — ED Triage Notes (Addendum)
Patient presents to ed via [facility] states he usually takes a breathing treatment at night missed his treatment last pm and today felt more sob, 45 mins PTA increased sob states he used Abluterol 5 mg/Atrovent.5 at home, was recently in the hospital and discharged on Sat. Of which he had a covid test done and it was negative. Denies fever currently on a predisone dose pak. Patient was given Albuterol 5 mg and atrovent .5mg   Mag. 2 gm, and Solu-Medrol 125 mg per ems.

## 2020-04-13 NOTE — ED Notes (Addendum)
Breathing tx completed.  Pt placed back on NRB

## 2020-04-14 LAB — CBC
HCT: 37.3 % — ABNORMAL LOW (ref 39.0–52.0)
Hemoglobin: 12 g/dL — ABNORMAL LOW (ref 13.0–17.0)
MCH: 30.8 pg (ref 26.0–34.0)
MCHC: 32.2 g/dL (ref 30.0–36.0)
MCV: 95.6 fL (ref 80.0–100.0)
Platelets: 257 10*3/uL (ref 150–400)
RBC: 3.9 MIL/uL — ABNORMAL LOW (ref 4.22–5.81)
RDW: 12.7 % (ref 11.5–15.5)
WBC: 7.8 10*3/uL (ref 4.0–10.5)
nRBC: 0 % (ref 0.0–0.2)

## 2020-04-14 LAB — GLUCOSE, CAPILLARY
Glucose-Capillary: 149 mg/dL — ABNORMAL HIGH (ref 70–99)
Glucose-Capillary: 131 mg/dL — ABNORMAL HIGH (ref 70–99)
Glucose-Capillary: 146 mg/dL — ABNORMAL HIGH (ref 70–99)
Glucose-Capillary: 351 mg/dL — ABNORMAL HIGH (ref 70–99)

## 2020-04-14 LAB — COMPREHENSIVE METABOLIC PANEL
ALT: 33 U/L (ref 0–44)
AST: 18 U/L (ref 15–41)
Albumin: 3 g/dL — ABNORMAL LOW (ref 3.5–5.0)
Alkaline Phosphatase: 41 U/L (ref 38–126)
Anion gap: 9 (ref 5–15)
BUN: 17 mg/dL (ref 6–20)
CO2: 33 mmol/L — ABNORMAL HIGH (ref 22–32)
Calcium: 9.1 mg/dL (ref 8.9–10.3)
Chloride: 96 mmol/L — ABNORMAL LOW (ref 98–111)
Creatinine, Ser: 1.13 mg/dL (ref 0.61–1.24)
GFR calc Af Amer: >60 mL/min (ref >60)
GFR calc non Af Amer: >60 mL/min (ref >60)
Glucose, Bld: 164 mg/dL — ABNORMAL HIGH (ref 70–99)
Potassium: 4 mmol/L (ref 3.5–5.1)
Sodium: 138 mmol/L (ref 135–145)
Total Bilirubin: 0.4 mg/dL (ref 0.3–1.2)
Total Protein: 5.4 g/dL — ABNORMAL LOW (ref 6.5–8.1)

## 2020-04-14 LAB — RAPID URINE DRUG SCREEN, HOSP PERFORMED
Amphetamines: NOT DETECTED
Barbiturates: NOT DETECTED
Benzodiazepines: NOT DETECTED
Cocaine: NOT DETECTED
Opiates: NOT DETECTED
Tetrahydrocannabinol: NOT DETECTED

## 2020-04-14 MED ORDER — IPRATROPIUM-ALBUTEROL 0.5-2.5 (3) MG/3ML IN SOLN: 3.0000 mL | RESPIRATORY_TRACT | Status: DC | PRN

## 2020-04-14 MED ORDER — BUSPIRONE HCL 15 MG PO TABS
15.0000 mg | ORAL_TABLET | Freq: Two times a day (BID) | ORAL | Status: DC
  Administered 2020-04-14 – 2020-04-16 (×5): 15 mg via ORAL
  Filled 2020-04-14 (×5): qty 1

## 2020-04-14 MED ORDER — FLUOXETINE HCL 20 MG PO CAPS
60.0000 mg | ORAL_CAPSULE | Freq: Every day | ORAL | Status: DC
  Administered 2020-04-14 – 2020-04-15 (×2): 60 mg via ORAL
  Filled 2020-04-14 (×2): qty 3

## 2020-04-14 MED ORDER — METHYLPREDNISOLONE SODIUM SUCC 125 MG IJ SOLR
60.0000 mg | Freq: Two times a day (BID) | INTRAMUSCULAR | Status: DC
  Administered 2020-04-14 – 2020-04-16 (×4): 60 mg via INTRAVENOUS
  Filled 2020-04-14 (×4): qty 2

## 2020-04-14 MED ORDER — FLUTICASONE-UMECLIDIN-VILANT 100-62.5-25 MCG/INH IN AEPB: 1.0000 | INHALATION_SPRAY | Freq: Every day | RESPIRATORY_TRACT | Status: DC

## 2020-04-14 MED ORDER — ASPIRIN EC 81 MG PO TBEC
81.0000 mg | DELAYED_RELEASE_TABLET | Freq: Every day | ORAL | Status: DC
  Administered 2020-04-14 – 2020-04-16 (×3): 81 mg via ORAL
  Filled 2020-04-14 (×3): qty 1

## 2020-04-14 MED ORDER — FLUTICASONE FUROATE-VILANTEROL 100-25 MCG/INH IN AEPB
1.0000 | INHALATION_SPRAY | Freq: Every day | RESPIRATORY_TRACT | Status: DC
  Administered 2020-04-14 – 2020-04-16 (×3): 1 via RESPIRATORY_TRACT
  Filled 2020-04-14: qty 28

## 2020-04-14 MED ORDER — IPRATROPIUM-ALBUTEROL 0.5-2.5 (3) MG/3ML IN SOLN
3.0000 mL | Freq: Two times a day (BID) | RESPIRATORY_TRACT | Status: DC
  Administered 2020-04-14 – 2020-04-16 (×4): 3 mL via RESPIRATORY_TRACT
  Filled 2020-04-14 (×4): qty 3

## 2020-04-14 MED ORDER — FUROSEMIDE 10 MG/ML IJ SOLN
40.0000 mg | Freq: Two times a day (BID) | INTRAMUSCULAR | Status: DC
  Administered 2020-04-14 – 2020-04-15 (×2): 40 mg via INTRAVENOUS
  Filled 2020-04-14 (×2): qty 4

## 2020-04-14 MED ORDER — PANTOPRAZOLE SODIUM 20 MG PO TBEC
20.0000 mg | DELAYED_RELEASE_TABLET | Freq: Every day | ORAL | Status: DC
  Administered 2020-04-14 – 2020-04-16 (×3): 20 mg via ORAL
  Filled 2020-04-14 (×3): qty 1

## 2020-04-14 MED ORDER — UMECLIDINIUM BROMIDE 62.5 MCG/INH IN AEPB
1.0000 | INHALATION_SPRAY | Freq: Every day | RESPIRATORY_TRACT | Status: DC
  Administered 2020-04-14 – 2020-04-16 (×3): 1 via RESPIRATORY_TRACT
  Filled 2020-04-14: qty 7

## 2020-04-14 MED ORDER — ISOSORBIDE MONONITRATE ER 30 MG PO TB24
30.0000 mg | ORAL_TABLET | Freq: Every day | ORAL | Status: DC
  Administered 2020-04-14 – 2020-04-16 (×3): 30 mg via ORAL
  Filled 2020-04-14 (×3): qty 1

## 2020-04-14 MED ORDER — AZITHROMYCIN 250 MG PO TABS
500.0000 mg | ORAL_TABLET | Freq: Every day | ORAL | Status: DC
  Administered 2020-04-15 – 2020-04-16 (×2): 500 mg via ORAL
  Filled 2020-04-14 (×2): qty 2

## 2020-04-14 MED ORDER — CARVEDILOL 12.5 MG PO TABS
12.5000 mg | ORAL_TABLET | Freq: Two times a day (BID) | ORAL | Status: DC
  Administered 2020-04-14 – 2020-04-16 (×5): 12.5 mg via ORAL
  Filled 2020-04-14 (×5): qty 1

## 2020-04-14 MED ORDER — FAMOTIDINE 20 MG PO TABS
20.0000 mg | ORAL_TABLET | Freq: Every day | ORAL | Status: DC
  Administered 2020-04-14 – 2020-04-16 (×3): 20 mg via ORAL
  Filled 2020-04-14 (×3): qty 1

## 2020-04-14 MED ORDER — FUROSEMIDE 20 MG PO TABS
20.0000 mg | ORAL_TABLET | Freq: Every day | ORAL | Status: DC
  Administered 2020-04-14: 20 mg via ORAL
  Filled 2020-04-14: qty 1

## 2020-04-14 MED ORDER — POTASSIUM CHLORIDE CRYS ER 20 MEQ PO TBCR
40.0000 meq | EXTENDED_RELEASE_TABLET | Freq: Every day | ORAL | Status: DC
  Administered 2020-04-14 – 2020-04-16 (×3): 40 meq via ORAL
  Filled 2020-04-14 (×3): qty 2

## 2020-04-14 MED ORDER — TRAZODONE HCL 50 MG PO TABS
50.0000 mg | ORAL_TABLET | Freq: Every evening | ORAL | Status: DC | PRN
  Administered 2020-04-15: 50 mg via ORAL
  Filled 2020-04-14: qty 1

## 2020-04-14 MED ORDER — IPRATROPIUM-ALBUTEROL 0.5-2.5 (3) MG/3ML IN SOLN
3.0000 mL | Freq: Four times a day (QID) | RESPIRATORY_TRACT | Status: DC
  Administered 2020-04-14: 3 mL via RESPIRATORY_TRACT
  Filled 2020-04-14: qty 3

## 2020-04-14 MED ORDER — AMLODIPINE BESYLATE 5 MG PO TABS
5.0000 mg | ORAL_TABLET | Freq: Every day | ORAL | Status: DC
  Administered 2020-04-14: 5 mg via ORAL
  Filled 2020-04-14: qty 1

## 2020-04-14 MED ORDER — LUBIPROSTONE 8 MCG PO CAPS
8.0000 ug | ORAL_CAPSULE | Freq: Two times a day (BID) | ORAL | Status: DC
  Administered 2020-04-14 – 2020-04-16 (×4): 8 ug via ORAL
  Filled 2020-04-14 (×4): qty 1

## 2020-04-14 MED ORDER — ADULT MULTIVITAMIN W/MINERALS CH
1.0000 | ORAL_TABLET | Freq: Every day | ORAL | Status: DC
  Administered 2020-04-14 – 2020-04-16 (×3): 1 via ORAL
  Filled 2020-04-14 (×3): qty 1

## 2020-04-14 NOTE — Progress Notes (Signed)
PROGRESS NOTE  Doctor Sheahan BLT:[id number] DOB: 1961-07-10 DOA: 04/13/2020 PCP: Mliss Sax, MD  HPI/Recap of past 24 hours: HPI: Kenneth [name] is a 59 y.o. male with medical history significant of hypertension, hyperlipidemia, type 2 diabetes mellitus, paroxysmal A. fib on Eliquis, chronic systolic CHF, COPD-not on home oxygen, OSA on CPAP, tobacco use disorder, CKD stage III who presents to emergency department with worsening shortness of breath since the morning of his presentation.  Associated with a nonproductive cough and wheezing.  He called EMS and came to the emergency department for further evaluation and management.  He continues to smoke 7 to 8 cigarettes/day, drinks 2-3 beers per week and uses marijuana every other day to help with anxiety and appetite.  He tells me that he is compliant with his home medications including Eliquis.  Of note: Patient admitted on 04/06/2020 with similar symptoms.  And discharged on 04/08/2020 on tapering dose of prednisone.  He tells me that he has been compliant with prednisone as prescribed.  He has follow-up appointment with pulmonary on August 11. [facility], hospitalist team, asked to admit for COPD exacerbation.  7//24/21: Seen and examined.  Reports persistent dyspnea with minimal movement.  Mild wheezing noted bilaterally on auscultation.  Added IV Solu-Medrol 60 mg twice daily.  BNP elevated >1200, will diurese with IV diuretics, Lasix 40 mg twice daily.  Assessment/Plan: Active Problems:   Essential hypertension   Chronic obstructive pulmonary disease (HCC)   Diabetes mellitus without complication (HCC)   Depression   PAF (paroxysmal atrial fibrillation) (HCC)   Chronic combined systolic and diastolic heart failure (HCC)   Gastroesophageal reflux disease without esophagitis   Acute respiratory failure with hypoxia (HCC)   Hyperlipidemia  Acute COPD exacerbation exacerbated by acute on chronic exacerbated by acute on chronic  combined diastolic and systolic CHF Ongoing tobacco use, tobacco cessation counseling done at bedside Start IV Solu-Medrol 60 mg twice daily and IV Lasix 40 mg twice daily Continue azithromycin x5 days for its anti-inflammatory properties Maintain O2 saturation greater than 88 to 90% Monitor urine output with strict I's and O's  Acute on chronic systolic CHF, suspect from dietary discretion Presented with dyspnea and BNP greater than 1200 Last 2 D echo on 04/07/2020 showed LVEF 45 to 50% from 30-35% in 2020  Continue IV diuretics 40 mg twice daily Add KCl replacement Start strict I's and O's and daily weight  Paroxysmal A. Fib Rate controlled on Coreg On Eliquis for CVA prevention Continue to monitor on telemetry  Type 2 diabetes with hyperglycemia Hemoglobin A1c Hold off oral hypoglycemics Sliding scale  Chronic anxiety/depression Continue close  IBS C/w [name]  OSA Continue CPAP at night  GERD Continue PPI and H2 blocker  Tobacco abuse disorder Ongoing tobacco use Tobacco cessation counseling done at bedside  Polysubstance abuse including marijuana Reports no use of cocaine in> 5 to 10 years    Code Status: Full code  Family Communication: None at bedside   Consultants:  None  Procedures:  None  Antimicrobials:  None  DVT prophylaxis: Eliquis  Status is: Inpatient   Dispo:  Patient From: Home  Planned Disposition: Home  Expected discharge date: 04/15/20  Medically stable for discharge: No, due to persistent symptomatology.         Objective: Vitals:   04/13/20 2352 04/14/20 0609 04/14/20 0835 04/14/20 1220  BP: (!) 161/79 (!) 172/83 (!) 181/81 (!) 162/79  Pulse: 63 60 60 61  Resp: 19 16  18   Temp: )  97.4 F (36.3 C) 97.6 F (36.4 C)  97.6 F (36.4 C)  TempSrc: Oral Oral  Oral  SpO2: 100% 97%  100%  Weight:      Height:       No intake or output data in the 24 hours ending 04/14/20 1437 Filed Weights   04/13/20 1424   Weight: 77.1 kg    Exam:  . General: 59 y.o. year-old male well developed well nourished in no acute distress.  Alert and oriented x3. . Cardiovascular: Regular rate and rhythm with no rubs or gallops.  No thyromegaly or JVD noted.   [name] Respiratory: Diffuse wheezing bilaterally.  Good inspiratory effort. . Abdomen: Soft nontender nondistended with normal bowel sounds x4 quadrants. . Musculoskeletal: Trace lower extremity edema bilaterally. [name] Psychiatry: Mood is appropriate for condition and setting   Data Reviewed: CBC: Recent Labs  Lab 04/13/20 1433 04/14/20 0355  WBC 8.8 7.8  HGB 12.2* 12.0*  HCT 38.9* 37.3*  MCV 97.3 95.6  PLT 279 257   Basic Metabolic Panel: Recent Labs  Lab 04/13/20 1433 04/13/20 1755 04/14/20 0355  NA 136  --  138  K 3.9  --  4.0  CL 95*  --  96*  CO2 25  --  33*  GLUCOSE 199*  --  164*  BUN 16  --  17  CREATININE 1.15  --  1.13  CALCIUM 9.1  --  9.1  MG  --  2.2  --    GFR: Estimated Creatinine Clearance: 77.7 mL/min (by C-G formula based on SCr of 1.13 mg/dL). Liver Function Tests: Recent Labs  Lab 04/13/20 1433 04/14/20 0355  AST 37 18  ALT 41 33  ALKPHOS 50 41  BILITOT 0.5 0.4  PROT 6.1* 5.4*  ALBUMIN 3.5 3.0*   No results for input(s): LIPASE, AMYLASE in the last 168 hours. No results for input(s): AMMONIA in the last 168 hours. Coagulation Profile: No results for input(s): INR, PROTIME in the last 168 hours. Cardiac Enzymes: No results for input(s): CKTOTAL, CKMB, CKMBINDEX, TROPONINI in the last 168 hours. BNP (last 3 results) No results for input(s): PROBNP in the last 8760 hours. HbA1C: Recent Labs    04/13/20 1755  HGBA1C 6.4*   CBG: Recent Labs  Lab 04/07/20 2128 04/08/20 0739 04/13/20 2124 04/14/20 0631 04/14/20 1112  GLUCAP 138* 92 251* 149* 131*   Lipid Profile: No results for input(s): CHOL, HDL, LDLCALC, TRIG, CHOLHDL, LDLDIRECT in the last 72 hours. Thyroid Function Tests: No results for  input(s): TSH, T4TOTAL, FREET4, T3FREE, THYROIDAB in the last 72 hours. Anemia Panel: No results for input(s): VITAMINB12, FOLATE, FERRITIN, TIBC, IRON, RETICCTPCT in the last 72 hours. Urine analysis:    Component Value Date/Time   COLORURINE YELLOW 05/20/2019 0825   APPEARANCEUR CLEAR 05/20/2019 0825   LABSPEC 1.020 05/20/2019 0825   PHURINE 6.0 05/20/2019 0825   GLUCOSEU NEGATIVE 05/20/2019 0825   HGBUR NEGATIVE 05/20/2019 0825   BILIRUBINUR SMALL (A) 05/20/2019 0825   KETONESUR TRACE (A) 05/20/2019 0825   UROBILINOGEN 1.0 05/20/2019 0825   NITRITE NEGATIVE 05/20/2019 0825   LEUKOCYTESUR NEGATIVE 05/20/2019 0825   Sepsis Labs: @LABRCNTIP (procalcitonin:4,lacticidven:4)  ) Recent Results (from the past 240 hour(s))  SARS Coronavirus 2 by RT PCR (hospital order, performed in [facility] hospital lab) Nasopharyngeal Nasopharyngeal Swab     Status: None   Collection Time: 04/06/20  8:11 PM   Specimen: Nasopharyngeal Swab  Result Value Ref Range Status   SARS Coronavirus 2 NEGATIVE NEGATIVE  Final    Comment: (NOTE) SARS-CoV-2 target nucleic acids are NOT DETECTED.  The SARS-CoV-2 RNA is generally detectable in upper and lower respiratory specimens during the acute phase of infection. The lowest concentration of SARS-CoV-2 viral copies this assay can detect is 250 copies / mL. A negative result does not preclude SARS-CoV-2 infection and should not be used as the sole basis for treatment or other patient management decisions.  A negative result may occur with improper specimen collection / handling, submission of specimen other than nasopharyngeal swab, presence of viral mutation(s) within the areas targeted by this assay, and inadequate number of viral copies (<250 copies / mL). A negative result must be combined with clinical observations, patient history, and epidemiological information.  Fact Sheet for Patients:   BoilerBrush.com.cy  Fact Sheet for  Healthcare Providers: https://pope.com/  This test is not yet approved or  cleared by the [place] FDA and has been authorized for detection and/or diagnosis of SARS-CoV-2 by FDA under an Emergency Use Authorization (EUA).  This EUA will remain in effect (meaning this test can be used) for the duration of the COVID-19 declaration under Section 564(b)(1) of the Act, 21 U.S.C. section 360bbb-3(b)(1), unless the authorization is terminated or revoked sooner.  Performed at [facility], [address] [place], [place] [address]   SARS Coronavirus 2 by RT PCR (hospital order, performed in [facility] hospital lab) Nasopharyngeal Nasopharyngeal Swab     Status: None   Collection Time: 04/13/20  5:34 PM   Specimen: Nasopharyngeal Swab  Result Value Ref Range Status   SARS Coronavirus 2 NEGATIVE NEGATIVE Final    Comment: (NOTE) SARS-CoV-2 target nucleic acids are NOT DETECTED.  The SARS-CoV-2 RNA is generally detectable in upper and lower respiratory specimens during the acute phase of infection. The lowest concentration of SARS-CoV-2 viral copies this assay can detect is 250 copies / mL. A negative result does not preclude SARS-CoV-2 infection and should not be used as the sole basis for treatment or other patient management decisions.  A negative result may occur with improper specimen collection / handling, submission of specimen other than nasopharyngeal swab, presence of viral mutation(s) within the areas targeted by this assay, and inadequate number of viral copies (<250 copies / mL). A negative result must be combined with clinical observations, patient history, and epidemiological information.  Fact Sheet for Patients:   BoilerBrush.com.cy  Fact Sheet for Healthcare Providers: https://pope.com/  This test is not yet approved or  cleared by the [place] FDA and has been authorized for  detection and/or diagnosis of SARS-CoV-2 by FDA under an Emergency Use Authorization (EUA).  This EUA will remain in effect (meaning this test can be used) for the duration of the COVID-19 declaration under Section 564(b)(1) of the Act, 21 U.S.C. section 360bbb-3(b)(1), unless the authorization is terminated or revoked sooner.  Performed at [facility], [address] [place], [place] [address]       Studies: DG Chest Port 1 View  Result Date: 04/13/2020 CLINICAL DATA:  Shortness of breath. EXAM: PORTABLE CHEST 1 VIEW COMPARISON:  April 06, 2020 FINDINGS: There is no evidence of acute infiltrate, pleural effusion or pneumothorax. The heart size and mediastinal contours are within normal limits. There is moderate severity calcification of the aortic arch. The visualized skeletal structures are unremarkable. IMPRESSION: No active disease. Electronically Signed   By: Aram Candela M.D.   On: 04/13/2020 15:02    Scheduled Meds: . amLODipine  5 mg Oral Daily  . apixaban  5 mg Oral BID  . aspirin EC  81 mg Oral Daily  . [START ON 04/15/2020] azithromycin  500 mg Oral Daily  . busPIRone  15 mg Oral BID  . carvedilol  12.5 mg Oral BID WC  . famotidine  20 mg Oral Daily  . FLUoxetine  60 mg Oral Daily  . fluticasone furoate-vilanterol  1 puff Inhalation Daily  . furosemide  40 mg Intravenous BID  . insulin aspart  0-15 Units Subcutaneous TID WC  . insulin aspart  0-5 Units Subcutaneous QHS  . ipratropium-albuterol  3 mL Nebulization Q6H  . isosorbide mononitrate  30 mg Oral Daily  . lubiprostone  8 mcg Oral BID WC  . methylPREDNISolone (SOLU-MEDROL) injection  60 mg Intravenous Q12H  . multivitamin with minerals  1 tablet Oral Daily  . nicotine  14 mg Transdermal Daily  . pantoprazole  20 mg Oral Daily  . potassium chloride  40 mEq Oral Daily  . umeclidinium bromide  1 puff Inhalation Daily    Continuous Infusions:   LOS: 1 day     Darlin Drop, MD [facility] Pager [phone]  If 7PM-7AM, please contact night-coverage [email or web address] Password [facility] 04/14/2020, 2:37 PM

## 2020-04-15 LAB — BASIC METABOLIC PANEL
Anion gap: 10 (ref 5–15)
BUN: 15 mg/dL (ref 6–20)
CO2: 31 mmol/L (ref 22–32)
Calcium: 9.3 mg/dL (ref 8.9–10.3)
Chloride: 94 mmol/L — ABNORMAL LOW (ref 98–111)
Creatinine, Ser: 1.13 mg/dL (ref 0.61–1.24)
GFR calc Af Amer: >60 mL/min (ref >60)
GFR calc non Af Amer: >60 mL/min (ref >60)
Glucose, Bld: 215 mg/dL — ABNORMAL HIGH (ref 70–99)
Potassium: 4.1 mmol/L (ref 3.5–5.1)
Sodium: 135 mmol/L (ref 135–145)

## 2020-04-15 LAB — GLUCOSE, CAPILLARY
Glucose-Capillary: 214 mg/dL — ABNORMAL HIGH (ref 70–99)
Glucose-Capillary: 178 mg/dL — ABNORMAL HIGH (ref 70–99)
Glucose-Capillary: 218 mg/dL — ABNORMAL HIGH (ref 70–99)
Glucose-Capillary: 195 mg/dL — ABNORMAL HIGH (ref 70–99)

## 2020-04-15 LAB — PHOSPHORUS: Phosphorus: 3 mg/dL (ref 2.5–4.6)

## 2020-04-15 LAB — MAGNESIUM: Magnesium: 2 mg/dL (ref 1.7–2.4)

## 2020-04-15 MED ORDER — AMLODIPINE BESYLATE 10 MG PO TABS
10.0000 mg | ORAL_TABLET | Freq: Every day | ORAL | Status: DC
  Administered 2020-04-15 – 2020-04-16 (×3): 10 mg via ORAL
  Filled 2020-04-15 (×3): qty 1

## 2020-04-15 MED ORDER — INSULIN GLARGINE 100 UNIT/ML ~~LOC~~ SOLN
4.0000 [IU] | Freq: Every day | SUBCUTANEOUS | Status: DC
  Administered 2020-04-15 – 2020-04-16 (×2): 4 [IU] via SUBCUTANEOUS
  Filled 2020-04-15 (×2): qty 0.04

## 2020-04-15 MED ORDER — SODIUM CHLORIDE 3 % IN NEBU
4.0000 mL | INHALATION_SOLUTION | Freq: Two times a day (BID) | RESPIRATORY_TRACT | Status: DC
  Administered 2020-04-15 – 2020-04-16 (×3): 4 mL via RESPIRATORY_TRACT
  Filled 2020-04-15 (×3): qty 4

## 2020-04-15 MED ORDER — DM-GUAIFENESIN ER 30-600 MG PO TB12
2.0000 | ORAL_TABLET | Freq: Two times a day (BID) | ORAL | Status: DC
  Administered 2020-04-15 – 2020-04-16 (×3): 2 via ORAL
  Filled 2020-04-15 (×3): qty 2

## 2020-04-15 MED ORDER — FUROSEMIDE 10 MG/ML IJ SOLN
40.0000 mg | Freq: Four times a day (QID) | INTRAMUSCULAR | Status: DC
  Administered 2020-04-15: 40 mg via INTRAVENOUS
  Filled 2020-04-15: qty 4

## 2020-04-15 MED ORDER — FUROSEMIDE 10 MG/ML IJ SOLN
40.0000 mg | Freq: Four times a day (QID) | INTRAMUSCULAR | Status: DC
  Administered 2020-04-15 – 2020-04-16 (×2): 40 mg via INTRAVENOUS
  Filled 2020-04-15 (×2): qty 4

## 2020-04-15 NOTE — Progress Notes (Signed)
PROGRESS NOTE  Kenneth [name] BJS:[id number] DOB: 1961-04-15 DOA: 04/13/2020 PCP: Mliss Sax, MD  HPI/Recap of past 24 hours: HPI: Kenneth [name] is a 59 y.o. male with medical history significant of hypertension, hyperlipidemia, type 2 diabetes mellitus, paroxysmal A. fib on Eliquis, chronic systolic CHF, COPD-not on home oxygen, ongoing tobacco user, OSA on CPAP, CKD stage III who presents to [facility] ED with worsening shortness of breath since the morning of his presentation.  Associated with a nonproductive cough and wheezing.  He called EMS and came to the emergency department for further evaluation and management.  Smoke 7 to 8 cigarettes/day, drinks 2-3 beers per week and uses marijuana every other day to help with anxiety and appetite.   Of note, admitted on 04/06/2020 with similar symptoms.  And discharged on 04/08/2020 on tapering dose of prednisone.  He tells me that he has been compliant with prednisone as prescribed.  He has follow-up appointment with pulmonary on August 11. [facility], hospitalist team, asked to admit for COPD exacerbation.  7//25/21: Seen and examined.  Feeling congested and has a productive cough.  Added pulmonary toilet.  Elevated BNP on presentation > 1200 in the setting of chronic systolic CHF.  Added IV Lasix 40 mg every 6 hours, ongoing diuresing.  Assessment/Plan: Active Problems:   Essential hypertension   Chronic obstructive pulmonary disease (HCC)   Diabetes mellitus without complication (HCC)   Depression   PAF (paroxysmal atrial fibrillation) (HCC)   Chronic combined systolic and diastolic heart failure (HCC)   Gastroesophageal reflux disease without esophagitis   Acute respiratory failure with hypoxia (HCC)   Hyperlipidemia  Acute COPD exacerbation complicated by acute on chronic systolic CHF Ongoing tobacco use, BNP greater than 1200, last 2D echo July 2021 LVEF 40 to 45% Continue IV Solu-Medrol 60 mg twice daily, azithromycin and  bronchodilators, added pulmonary toilet for COPD exacerbation  Continue IV Lasix 40 mg 4 times daily for acute on chronic systolic CHF  O2 saturation improved, now on room air.   Continue to monitor urine output and oxygen saturation.  Acute on chronic systolic CHF, suspect from dietary indiscretion Presented with dyspnea and BNP greater than 1200 Last 2 D echo on 04/07/2020 showed LVEF 45 to 50% from 30-35% in 2020  Continue IV diuretics Lasix 40 mg 4 times daily Continue potassium replacement Continue home cardiac medications Continue strict I's and O's and daily weight Net I&O -1.1 L  Essential hypertension BP is not at goal Increased home Norvasc dose to 10 mg daily Continue to closely monitor BP while on IV diuretics Currently on Norvasc 10 mg daily, Coreg 12.5 mg twice daily, IV Lasix 40 mg every 6 hours  Paroxysmal A. Fib Rate controlled on Coreg On Eliquis for primary CVA prevention Continue to monitor on telemetry  Type 2 diabetes with hyperglycemia likely exacerbated by IV steroids Hemoglobin A1c 6.4 Hold off oral hypoglycemics Continue insulin sliding scale Added Lantus 4 units daily while on IV steroids  Chronic anxiety/depression Continue Prozac  IBS C/w [name]  OSA Continue CPAP at night  GERD Continue PPI and H2 blocker  Tobacco abuse disorder Ongoing tobacco use Tobacco cessation counseling done at bedside Receptive to quitting tobacco  Polysubstance abuse including marijuana Reports no use of cocaine in> 5 to 10 years UDS negative for polysubstance use    Code Status: Full code  Family Communication: None at bedside   Consultants:  None  Procedures:  None  Antimicrobials:  None  DVT prophylaxis: Eliquis  Status is: Inpatient   Dispo:  Patient From: Home  Planned Disposition: Home  Expected discharge date: 04/16/20  Medically stable for discharge: No, ongoing diuresing         Objective: Vitals:   04/15/20  0503 04/15/20 0829 04/15/20 0957 04/15/20 1116  BP: (!) 176/84  (!) 160/84 (!) 150/83  Pulse: 59  62 62  Resp: 16  17 18   Temp: 97.9 F (36.6 C)   (!) 97 F (36.1 C)  TempSrc: Oral   Axillary  SpO2: 98% 98%  97%  Weight: 78.7 kg     Height:        Intake/Output Summary (Last 24 hours) at 04/15/2020 1342 Last data filed at 04/15/2020 04/17/2020 Gross per 24 hour  Intake 240 ml  Output 1425 ml  Net -1185 ml   Filed Weights   04/13/20 1424 04/15/20 0503  Weight: 77.1 kg 78.7 kg    Exam:  . General: 59 y.o. year-old male well-developed well-nourished in no acute stress.  Alert and oriented x3.   41 Respiratory: Mild rales at bases no wheezing noted.  Good respiratory effort. . Abdomen: Soft mildly distended nontender bowel sounds present. . Musculoskeletal: Trace lower extremity edema bilaterally.   [name] Psychiatry: Mood is appropriate for condition and setting.   Data Reviewed: CBC: Recent Labs  Lab 04/13/20 1433 04/14/20 0355  WBC 8.8 7.8  HGB 12.2* 12.0*  HCT 38.9* 37.3*  MCV 97.3 95.6  PLT 279 257   Basic Metabolic Panel: Recent Labs  Lab 04/13/20 1433 04/13/20 1755 04/14/20 0355 04/15/20 0715  NA 136  --  138 135  K 3.9  --  4.0 4.1  CL 95*  --  96* 94*  CO2 25  --  33* 31  GLUCOSE 199*  --  164* 215*  BUN 16  --  17 15  CREATININE 1.15  --  1.13 1.13  CALCIUM 9.1  --  9.1 9.3  MG  --  2.2  --  2.0  PHOS  --   --   --  3.0   GFR: Estimated Creatinine Clearance: 78.2 mL/min (by C-G formula based on SCr of 1.13 mg/dL). Liver Function Tests: Recent Labs  Lab 04/13/20 1433 04/14/20 0355  AST 37 18  ALT 41 33  ALKPHOS 50 41  BILITOT 0.5 0.4  PROT 6.1* 5.4*  ALBUMIN 3.5 3.0*   No results for input(s): LIPASE, AMYLASE in the last 168 hours. No results for input(s): AMMONIA in the last 168 hours. Coagulation Profile: No results for input(s): INR, PROTIME in the last 168 hours. Cardiac Enzymes: No results for input(s): CKTOTAL, CKMB, CKMBINDEX, TROPONINI  in the last 168 hours. BNP (last 3 results) No results for input(s): PROBNP in the last 8760 hours. HbA1C: Recent Labs    04/13/20 1755  HGBA1C 6.4*   CBG: Recent Labs  Lab 04/14/20 1112 04/14/20 1616 04/14/20 2148 04/15/20 0622 04/15/20 1118  GLUCAP 131* 146* 351* 214* 178*   Lipid Profile: No results for input(s): CHOL, HDL, LDLCALC, TRIG, CHOLHDL, LDLDIRECT in the last 72 hours. Thyroid Function Tests: No results for input(s): TSH, T4TOTAL, FREET4, T3FREE, THYROIDAB in the last 72 hours. Anemia Panel: No results for input(s): VITAMINB12, FOLATE, FERRITIN, TIBC, IRON, RETICCTPCT in the last 72 hours. Urine analysis:    Component Value Date/Time   COLORURINE YELLOW 05/20/2019 0825   APPEARANCEUR CLEAR 05/20/2019 0825   LABSPEC 1.020 05/20/2019 0825   PHURINE 6.0 05/20/2019 0825   GLUCOSEU NEGATIVE 05/20/2019 0825  HGBUR NEGATIVE 05/20/2019 0825   BILIRUBINUR SMALL (A) 05/20/2019 0825   KETONESUR TRACE (A) 05/20/2019 0825   UROBILINOGEN 1.0 05/20/2019 0825   NITRITE NEGATIVE 05/20/2019 0825   LEUKOCYTESUR NEGATIVE 05/20/2019 0825   Sepsis Labs: @LABRCNTIP (procalcitonin:4,lacticidven:4)  ) Recent Results (from the past 240 hour(s))  SARS Coronavirus 2 by RT PCR (hospital order, performed in [facility] hospital lab) Nasopharyngeal Nasopharyngeal Swab     Status: None   Collection Time: 04/06/20  8:11 PM   Specimen: Nasopharyngeal Swab  Result Value Ref Range Status   SARS Coronavirus 2 NEGATIVE NEGATIVE Final    Comment: (NOTE) SARS-CoV-2 target nucleic acids are NOT DETECTED.  The SARS-CoV-2 RNA is generally detectable in upper and lower respiratory specimens during the acute phase of infection. The lowest concentration of SARS-CoV-2 viral copies this assay can detect is 250 copies / mL. A negative result does not preclude SARS-CoV-2 infection and should not be used as the sole basis for treatment or other patient management decisions.  A negative result may  occur with improper specimen collection / handling, submission of specimen other than nasopharyngeal swab, presence of viral mutation(s) within the areas targeted by this assay, and inadequate number of viral copies (<250 copies / mL). A negative result must be combined with clinical observations, patient history, and epidemiological information.  Fact Sheet for Patients:   BoilerBrush.com.cy  Fact Sheet for Healthcare Providers: https://pope.com/  This test is not yet approved or  cleared by the [place] FDA and has been authorized for detection and/or diagnosis of SARS-CoV-2 by FDA under an Emergency Use Authorization (EUA).  This EUA will remain in effect (meaning this test can be used) for the duration of the COVID-19 declaration under Section 564(b)(1) of the Act, 21 U.S.C. section 360bbb-3(b)(1), unless the authorization is terminated or revoked sooner.  Performed at [facility], [address] [place], [place] [address]   SARS Coronavirus 2 by RT PCR (hospital order, performed in [facility] hospital lab) Nasopharyngeal Nasopharyngeal Swab     Status: None   Collection Time: 04/13/20  5:34 PM   Specimen: Nasopharyngeal Swab  Result Value Ref Range Status   SARS Coronavirus 2 NEGATIVE NEGATIVE Final    Comment: (NOTE) SARS-CoV-2 target nucleic acids are NOT DETECTED.  The SARS-CoV-2 RNA is generally detectable in upper and lower respiratory specimens during the acute phase of infection. The lowest concentration of SARS-CoV-2 viral copies this assay can detect is 250 copies / mL. A negative result does not preclude SARS-CoV-2 infection and should not be used as the sole basis for treatment or other patient management decisions.  A negative result may occur with improper specimen collection / handling, submission of specimen other than nasopharyngeal swab, presence of viral mutation(s) within the areas targeted by  this assay, and inadequate number of viral copies (<250 copies / mL). A negative result must be combined with clinical observations, patient history, and epidemiological information.  Fact Sheet for Patients:   BoilerBrush.com.cy  Fact Sheet for Healthcare Providers: https://pope.com/  This test is not yet approved or  cleared by the [place] FDA and has been authorized for detection and/or diagnosis of SARS-CoV-2 by FDA under an Emergency Use Authorization (EUA).  This EUA will remain in effect (meaning this test can be used) for the duration of the COVID-19 declaration under Section 564(b)(1) of the Act, 21 U.S.C. section 360bbb-3(b)(1), unless the authorization is terminated or revoked sooner.  Performed at [facility], [address], [place], [place] [address]       Studies: No results found.  Scheduled Meds: . amLODipine  10 mg Oral Daily  . apixaban  5 mg Oral BID  . aspirin EC  81 mg Oral Daily  . azithromycin  500 mg Oral Daily  . busPIRone  15 mg Oral BID  . carvedilol  12.5 mg Oral BID WC  . dextromethorphan-guaiFENesin  2 tablet Oral BID  . famotidine  20 mg Oral Daily  . FLUoxetine  60 mg Oral Daily  . fluticasone furoate-vilanterol  1 puff Inhalation Daily  . furosemide  40 mg Intravenous Q6H  . insulin aspart  0-15 Units Subcutaneous TID WC  . insulin aspart  0-5 Units Subcutaneous QHS  . insulin glargine  4 Units Subcutaneous Daily  . ipratropium-albuterol  3 mL Nebulization BID  . isosorbide mononitrate  30 mg Oral Daily  . lubiprostone  8 mcg Oral BID WC  . methylPREDNISolone (SOLU-MEDROL) injection  60 mg Intravenous Q12H  . multivitamin with minerals  1 tablet Oral Daily  . nicotine  14 mg Transdermal Daily  . pantoprazole  20 mg Oral Daily  . potassium chloride  40 mEq Oral Daily  . sodium chloride HYPERTONIC  4 mL Nebulization BID  . umeclidinium bromide  1 puff Inhalation Daily     Continuous Infusions:   LOS: 2 days     Darlin Drop, MD [facility] Pager [phone]  If 7PM-7AM, please contact night-coverage [email or web address] Password [email or web address] 04/15/2020, 1:42 PM

## 2020-04-16 ENCOUNTER — Telehealth: Payer: Self-pay | Admitting: Family Medicine

## 2020-04-16 LAB — BASIC METABOLIC PANEL
Anion gap: 12 (ref 5–15)
BUN: 23 mg/dL — ABNORMAL HIGH (ref 6–20)
CO2: 34 mmol/L — ABNORMAL HIGH (ref 22–32)
Calcium: 9.6 mg/dL (ref 8.9–10.3)
Chloride: 93 mmol/L — ABNORMAL LOW (ref 98–111)
Creatinine, Ser: 1.38 mg/dL — ABNORMAL HIGH (ref 0.61–1.24)
GFR calc Af Amer: >60 mL/min (ref >60)
GFR calc non Af Amer: 56 mL/min — ABNORMAL LOW (ref >60)
Glucose, Bld: 177 mg/dL — ABNORMAL HIGH (ref 70–99)
Potassium: 4 mmol/L (ref 3.5–5.1)
Sodium: 139 mmol/L (ref 135–145)

## 2020-04-16 LAB — GLUCOSE, CAPILLARY: Glucose-Capillary: 180 mg/dL — ABNORMAL HIGH (ref 70–99)

## 2020-04-16 MED ORDER — METHYLPREDNISOLONE SODIUM SUCC 125 MG IJ SOLR
60.0000 mg | Freq: Once | INTRAMUSCULAR | Status: AC
  Administered 2020-04-16: 60 mg via INTRAVENOUS
  Filled 2020-04-16: qty 2

## 2020-04-16 MED ORDER — FUROSEMIDE 10 MG/ML IJ SOLN
60.0000 mg | Freq: Once | INTRAMUSCULAR | Status: AC
  Administered 2020-04-16: 60 mg via INTRAVENOUS
  Filled 2020-04-16: qty 6

## 2020-04-16 NOTE — Telephone Encounter (Signed)
Spoke with patient who states that he was seen at ED and medications were decrease. No other concerns.

## 2020-04-16 NOTE — Telephone Encounter (Signed)
Patient is calling to see if he can get a referral to [facility]. He states that he wants to change from the previous Foot and Ankle provider that he sees. Please call patient at [phone] and advise.

## 2020-04-16 NOTE — Telephone Encounter (Signed)
Patient calling states that he have not had a good experience with the current foot doctor that he sees and would like to see if he could have a referral to a podiatrist. Please advise.

## 2020-04-16 NOTE — Discharge Summary (Signed)
Discharge Summary  Kenneth [name] XNT:[id number] DOB: 27-Mar-1961  PCP: Kenneth Maw, MD  Admit date: 04/13/2020 Discharge date: 04/16/2020  Time spent: 35 minutes  Recommendations for Outpatient Follow-up:  1. Follow-up with your cardiologist 2. Follow-up with your primary care provider 3. Abstain from tobacco use or secondhand smoking 4. Take your medications as prescribed.  Discharge Diagnoses:  Active Hospital Problems   Diagnosis Date Noted   Hyperlipidemia    Acute respiratory failure with hypoxia (HCC) 04/06/2020   Gastroesophageal reflux disease without esophagitis 08/10/2018   PAF (paroxysmal atrial fibrillation) (Kenneth [name]) 07/27/2018   Chronic combined systolic and diastolic heart failure (Kenneth [name]) 07/27/2018   Essential hypertension 07/06/2018   Chronic obstructive pulmonary disease (Kenneth [name]) 07/06/2018   Diabetes mellitus without complication (Kenneth [name]) 96/75/9163   Depression 07/06/2018    Resolved Hospital Problems  No resolved problems to display.    Discharge Condition: Stable  Diet recommendation: Heart healthy carb modified diet  Vitals:   04/16/20 0453 04/16/20 0820  BP: (!) 155/80   Pulse: 59   Resp: 16   Temp: (!) 97.5 F (36.4 C)   SpO2: 100% 97%    History of present illness:  WGY:KZLDJT Kenneth [name] a 59 y.o.malewith medical history significant ofhypertension, hyperlipidemia, type 2 diabetes mellitus, paroxysmal A. fib on Eliquis, chronic systolic CHF, COPD-not on home oxygen, ongoing tobacco user, OSA on CPAP, CKD stage III who presents to [facility] ED with worsening shortness of breath since the morning of his presentation.  Associated with a nonproductive cough and wheezing.  He called EMS and came to the emergency department for further evaluation and management.  Smoke 7 to 8 cigarettes/day, drinks 2-3 beers per week and uses marijuana every other day to help with anxiety and appetite.   Of note, admitted on 04/06/2020 with similar symptoms.  And discharged on 04/08/2020 on tapering dose of prednisone. He tells me that he has been compliant with prednisone as prescribed. He has follow-up appointment with pulmonary on August 11. TRH, hospitalist team, asked to admit for COPD exacerbation.  7//25/21: Seen and examined.  Feeling congested and has a productive cough.  Added pulmonary toilet.  Elevated BNP on presentation > 1200 in the setting of chronic systolic CHF.  Added IV Lasix 40 mg every 6 hours, ongoing diuresing.   Hospital Course:  Active Problems:   Essential hypertension   Chronic obstructive pulmonary disease (HCC)   Diabetes mellitus without complication (HCC)   Depression   PAF (paroxysmal atrial fibrillation) (HCC)   Chronic combined systolic and diastolic heart failure (HCC)   Gastroesophageal reflux disease without esophagitis   Acute respiratory failure with hypoxia (HCC)   Hyperlipidemia  Acute COPD exacerbation complicated by acute on chronic systolic CHF Ongoing tobacco use, BNP greater than 1200, last 2D echo July 2021 LVEF 40 to 45% Received 2 days of IV Solu-Medrol 60 mg twice daily and 3 days of azithromycin. Resume prednisone taper from home Continue home bronchodilators Continue home diuretics Abstain from tobacco use or secondhand smoking Follow-up with your cardiologist Follow-up with your primary care provider or pulmonologist  Acute on chronic systolic CHF, suspect from dietary indiscretion Presented with dyspnea and BNP greater than 1200 Last 2 D echo on 04/07/2020 showed LVEF 45 to 50% from 30-35% in 2020  Received IV diuretic Net I&O -2.5L Continue home diuretics Continue potassium replacement Continue home cardiac medications Follow-up with your cardiologist  Essential hypertension Resume prior to admission antihypertensive regimen Follow-up with your primary care provider  Paroxysmal A. Fib Rate  controlled on Coreg On Eliquis for primary CVA prevention Follow-up with  cardiology  Type 2 diabetes with hyperglycemia likely exacerbated by IV steroids Hemoglobin A1c 6.4 Resume home regimen  Chronic anxiety/depression Continue Prozac  IBS C/w [name]  OSA Continue CPAP at night  GERD Continue PPI and H2 blocker  Tobacco abuse disorder Ongoing tobacco use Tobacco cessation counseling done at bedside Receptive to quitting tobacco  Polysubstance abuse including marijuana No recent use UDS negative for polysubstance use     Code Status: Full code    Consultants:  None  Procedures:  None  Antimicrobials:  None  DVT prophylaxis: Eliquis    Discharge Exam: BP (!) 155/80 (BP Location: Right Arm)    Pulse 59    Temp (!) 97.5 F (36.4 C) (Oral)    Resp 16    Ht 6' (1.829 m)    Wt 76.3 kg    SpO2 97%    BMI 22.83 kg/m   General: 59 y.o. year-old male well developed well nourished in no acute distress.  Alert and oriented x3.  Cardiovascular: Regular rate and rhythm with no rubs or gallops.  No thyromegaly or JVD noted.    Respiratory: Clear to auscultation with no wheezes or rales. Good inspiratory effort.  Abdomen: Soft nontender nondistended with normal bowel sounds x4 quadrants.  Musculoskeletal: No lower extremity edema. 2/4 pulses in all 4 extremities.  Psychiatry: Mood is appropriate for condition and setting  Discharge Instructions You were cared for by a hospitalist during your hospital stay. If you have any questions about your discharge medications or the care you received while you were in the hospital after you are discharged, you can call the unit and asked to speak with the hospitalist on call if the hospitalist that took care of you is not available. Once you are discharged, your primary care physician will handle any further medical issues. Please note that NO REFILLS for any discharge medications will be authorized once you are discharged, as it is imperative that you return to your primary care  physician (or establish a relationship with a primary care physician if you do not have one) for your aftercare needs so that they can reassess your need for medications and monitor your lab values.   Allergies as of 04/16/2020      Reactions   Lisinopril Swelling   Other Other (See Comments)   Lettuce : rash   Tomato Rash      Medication List    TAKE these medications   acetaminophen 325 MG tablet Commonly known as: TYLENOL Take 650 mg by mouth every 6 (six) hours as needed for mild pain or headache.   albuterol 108 (90 Base) MCG/ACT inhaler Commonly known as: VENTOLIN HFA Inhale 1-2 puffs into the lungs every 4 (four) hours as needed for wheezing or shortness of breath.   albuterol (2.5 MG/3ML) 0.083% nebulizer solution Commonly known as: PROVENTIL Take 3 mLs (2.5 mg total) by nebulization 5 (five) times daily as needed for wheezing or shortness of breath.   amLODipine 5 MG tablet Commonly known as: NORVASC TAKE 1 TABLET BY MOUTH  DAILY   apixaban 5 MG Tabs tablet Commonly known as: Eliquis Take 1 tablet (5 mg total) by mouth 2 (two) times daily.   aspirin EC 81 MG tablet Take 81 mg by mouth daily.   busPIRone 15 MG tablet Commonly known as: BUSPAR Take 1 tablet (15 mg total) by mouth 2 (two) times daily.   carvedilol 12.5 MG  tablet Commonly known as: COREG Take 1 tablet (12.5 mg total) by mouth 2 (two) times daily with a meal.   diphenhydrAMINE 25 MG tablet Commonly known as: BENADRYL Take 50 mg by mouth every 6 (six) hours as needed for itching or allergies.   famotidine 20 MG tablet Commonly known as: PEPCID TAKE 1 TABLET BY MOUTH  DAILY   FLUoxetine 20 MG capsule Commonly known as: PROZAC TAKE 3 CAPSULES BY MOUTH  DAILY   furosemide 20 MG tablet Commonly known as: LASIX TAKE 1 TABLET BY MOUTH  DAILY   ipratropium 0.02 % nebulizer solution Commonly known as: ATROVENT Take 2.5 mLs (0.5 mg total) by nebulization 4 (four) times daily.   isosorbide  mononitrate 30 MG 24 hr tablet Commonly known as: IMDUR TAKE 1 TABLET BY MOUTH EVERY DAY   losartan 25 MG tablet Commonly known as: COZAAR Take 1 tablet (25 mg total) by mouth daily. Please schedule annual appt with Dr. Harrell Gave for refills. [phone]. 3rd attempt. What changed: additional instructions   lubiprostone 8 MCG capsule Commonly known as: [name] Take 1 capsule (8 mcg total) by mouth 2 (two) times daily with a meal.   metFORMIN 500 MG tablet Commonly known as: GLUCOPHAGE TAKE 1 TABLET BY MOUTH  TWICE DAILY WITH MEALS   multivitamin with minerals Tabs tablet Take 1 tablet by mouth daily.   nicotine polacrilex 4 MG gum Commonly known as: NICORETTE Take 4 mg by mouth as needed for smoking cessation.   nitroGLYCERIN 0.4 MG SL tablet Commonly known as: NITROSTAT DISSOLVE 1 TABLET UNDER THE TONGUE EVERY 5 MINUTES AS  NEEDED FOR CHEST PAIN. MAX  OF 3 TABLETS IN 15 MINUTES. CALL 911 IF PAIN PERSISTS. What changed: See the new instructions.   ONE TOUCH ULTRA 2 w/Device Kit Use to test blood sugars 1-2 times daily.   OneTouch Ultra test strip Generic drug: glucose blood Use to test blood sugars 1-2 times daily.   onetouch ultrasoft lancets Use to test blood sugars 1-2 times daily.   pantoprazole 20 MG tablet Commonly known as: Protonix Take 1 tablet (20 mg total) by mouth daily.   predniSONE 10 MG tablet Commonly known as: DELTASONE 40 mg x 2 days then 30 mg x 2 days then 20 mg x 2 days then 10 mg x 2 days then stop   traZODone 50 MG tablet Commonly known as: DESYREL TAKE 1/2 TO 1 TABLET BY  MOUTH AT BEDTIME AS NEEDED  FOR SLEEP. What changed:   how much to take  reasons to take this  additional instructions   Trelegy Ellipta 100-62.5-25 MCG/INH Aepb Generic drug: Fluticasone-Umeclidin-Vilant Inhale 1 puff into the lungs daily.      Allergies  Allergen Reactions   Lisinopril Swelling   Other Other (See Comments)    Lettuce : rash   Tomato  Rash    Follow-up Information    Kenneth Maw, MD. Call in 1 day(s).   Specialty: Family Medicine Why: call for a post hospital follow up appointment Contact information: [place] [address] [phone]        Buford Dresser, MD .   Specialty: Cardiology Contact information: [address] [place] [address] [phone]                The results of significant diagnostics from this hospitalization (including imaging, microbiology, ancillary and laboratory) are listed below for reference.    Significant Diagnostic Studies: DG Chest [facility] [address]  Result Date: 04/13/2020 CLINICAL DATA:  Shortness of breath. EXAM: PORTABLE CHEST 1 VIEW COMPARISON:  April 06, 2020 FINDINGS: There is no evidence of acute infiltrate, pleural effusion or pneumothorax. The heart size and mediastinal contours are within normal limits. There is moderate severity calcification of the aortic arch. The visualized skeletal structures are unremarkable. IMPRESSION: No active disease. Electronically Signed   By: Virgina Norfolk M.D.   On: 04/13/2020 15:02   DG Chest Port 1 View  Result Date: 04/06/2020 CLINICAL DATA:  Shortness of breath. EXAM: PORTABLE CHEST 1 VIEW COMPARISON:  March 08, 2020. FINDINGS: The heart size and mediastinal contours are within normal limits. Both lungs are clear. No pneumothorax or pleural effusion is noted. The visualized skeletal structures are unremarkable. IMPRESSION: No active disease. Aortic Atherosclerosis (ICD10-I70.0). Electronically Signed   By: Marijo Conception M.D.   On: 04/06/2020 20:21   ECHOCARDIOGRAM COMPLETE  Result Date: 04/07/2020    ECHOCARDIOGRAM REPORT   Patient Name:   Kenneth [name] Date of Exam: 04/07/2020 Medical Rec #:  [id number]    Height:       72.0 in Accession #:    [id number]   Weight:       166.2 lb Date of Birth:  1961/03/03   BSA:          1.969 m Patient Age:    9 years     BP:            165/89 mmHg Patient Gender: M            HR:           83 bpm. Exam Location:  Inpatient Procedure: 2D Echo, Cardiac Doppler and Color Doppler Indications:    CHF-Acute Systolic 588.32 / P49.82  History:        Patient has prior history of Echocardiogram examinations, most                 recent 06/13/2019. CHF, COPD, Arrythmias:Atrial Fibrillation,                 Signs/Symptoms:Chest Pain and Shortness of Breath; Risk                 Factors:Hypertension, Diabetes and Current Smoker. GERD.  Sonographer:    Vickie Epley RDCS Referring Phys: [id number] [place] T TU IMPRESSIONS  1. LVEF is mildly depressed with inferior hypokinesis. Compared to previous echo, LVEF is improved. . Left ventricular ejection fraction, by estimation, is 45 to 50%. The left ventricle has mildly decreased function. The left ventricle demonstrates regional wall motion abnormalities (see scoring diagram/findings for description). The left ventricular internal cavity size was mildly dilated. Indeterminate diastolic filling due to E-A fusion.  2. Right ventricular systolic function is normal. The right ventricular size is normal. There is normal pulmonary artery systolic pressure.  3. The mitral valve is abnormal. Trivial mitral valve regurgitation.  4. The aortic valve is abnormal. Aortic valve regurgitation is mild. Mild to moderate aortic valve sclerosis/calcification is present, without any evidence of aortic stenosis.  5. The inferior vena cava is normal in size with greater than 50% respiratory variability, suggesting right atrial pressure of 3 mmHg. FINDINGS  Left Ventricle: LVEF is mildly depressed with inferior hypokinesis. Compared to previous echo, LVEF is improved. Left ventricular ejection fraction, by estimation, is 45 to 50%. The left ventricle has mildly decreased function. The left ventricle demonstrates regional wall motion abnormalities. The left ventricular internal cavity size was mildly dilated. There is no  left ventricular  hypertrophy. Indeterminate diastolic filling due to E-A fusion. Right Ventricle: The right ventricular size is normal. Right vetricular wall thickness was not assessed. Right ventricular systolic function is normal. There is normal pulmonary artery systolic pressure. The tricuspid regurgitant velocity is 1.53 m/s, and with an assumed right atrial pressure of 3 mmHg, the estimated right ventricular systolic pressure is 56.9 mmHg. Left Atrium: Left atrial size was normal in size. Right Atrium: Right atrial size was normal in size. Pericardium: There is no evidence of pericardial effusion. Mitral Valve: The mitral valve is abnormal. There is mild thickening of the mitral valve leaflet(s). Mild mitral annular calcification. Trivial mitral valve regurgitation. Tricuspid Valve: The tricuspid valve is normal in structure. Tricuspid valve regurgitation is trivial. Aortic Valve: The aortic valve is abnormal. Aortic valve regurgitation is mild. Mild to moderate aortic valve sclerosis/calcification is present, without any evidence of aortic stenosis. Pulmonic Valve: The pulmonic valve was not well visualized. Pulmonic valve regurgitation is not visualized. Aorta: The aortic root is normal in size and structure. Venous: The inferior vena cava is normal in size with greater than 50% respiratory variability, suggesting right atrial pressure of 3 mmHg. IAS/Shunts: No atrial level shunt detected by color flow Doppler.  LEFT VENTRICLE PLAX 2D LVIDd:         5.60 cm LVIDs:         4.50 cm LV PW:         0.90 cm LV IVS:        0.90 cm LVOT diam:     2.30 cm LV SV:         71 LV SV Index:   36 LVOT Area:     4.15 cm  LV Volumes (MOD) LV vol d, MOD A2C: 121.0 ml LV vol d, MOD A4C: 117.0 ml LV vol s, MOD A2C: 78.2 ml LV vol s, MOD A4C: 63.2 ml LV SV MOD A2C:     42.8 ml LV SV MOD A4C:     117.0 ml LV SV MOD BP:      48.5 ml RIGHT VENTRICLE RV S prime:     12.60 cm/s TAPSE (M-mode): 2.2 cm LEFT ATRIUM             Index       RIGHT ATRIUM            Index LA diam:        3.30 cm 1.68 cm/m  RA Area:     12.90 cm LA Vol (A2C):   37.6 ml 19.09 ml/m RA Volume:   28.00 ml  14.22 ml/m LA Vol (A4C):   25.7 ml 13.05 ml/m LA Biplane Vol: 31.0 ml 15.74 ml/m  AORTIC VALVE LVOT Vmax:   80.40 cm/s LVOT Vmean:  57.300 cm/s LVOT VTI:    0.171 m  AORTA Ao Root diam: 3.40 cm TRICUSPID VALVE TR Peak grad:   9.4 mmHg TR Vmax:        153.00 cm/s  SHUNTS Systemic VTI:  0.17 m Systemic Diam: 2.30 cm Dorris Carnes MD Electronically signed by Dorris Carnes MD Signature Date/Time: 04/07/2020/10:37:08 AM    Final     Microbiology: Recent Results (from the past 240 hour(s))  SARS Coronavirus 2 by RT PCR (hospital order, performed in [place] hospital lab) Nasopharyngeal Nasopharyngeal Swab     Status: None   Collection Time: 04/06/20  8:11 PM   Specimen: Nasopharyngeal Swab  Result Value Ref Range Status   SARS Coronavirus 2  NEGATIVE NEGATIVE Final    Comment: (NOTE) SARS-CoV-2 target nucleic acids are NOT DETECTED.  The SARS-CoV-2 RNA is generally detectable in upper and lower respiratory specimens during the acute phase of infection. The lowest concentration of SARS-CoV-2 viral copies this assay can detect is 250 copies / mL. A negative result does not preclude SARS-CoV-2 infection and should not be used as the sole basis for treatment or other patient management decisions.  A negative result may occur with improper specimen collection / handling, submission of specimen other than nasopharyngeal swab, presence of viral mutation(s) within the areas targeted by this assay, and inadequate number of viral copies (<250 copies / mL). A negative result must be combined with clinical observations, patient history, and epidemiological information.  Fact Sheet for Patients:   StrictlyIdeas.no  Fact Sheet for Healthcare Providers: BankingDealers.co.za  This test is not yet approved or  cleared by the [place] FDA and has been authorized for detection and/or diagnosis of SARS-CoV-2 by FDA under an Emergency Use Authorization (EUA).  This EUA will remain in effect (meaning this test can be used) for the duration of the COVID-19 declaration under Section 564(b)(1) of the Act, 21 U.S.C. section 360bbb-3(b)(1), unless the authorization is terminated or revoked sooner.  Performed at [facility], [place] [address], [place], [place] [address]   SARS Coronavirus 2 by RT PCR (hospital order, performed in [facility] hospital lab) Nasopharyngeal Nasopharyngeal Swab     Status: None   Collection Time: 04/13/20  5:34 PM   Specimen: Nasopharyngeal Swab  Result Value Ref Range Status   SARS Coronavirus 2 NEGATIVE NEGATIVE Final    Comment: (NOTE) SARS-CoV-2 target nucleic acids are NOT DETECTED.  The SARS-CoV-2 RNA is generally detectable in upper and lower respiratory specimens during the acute phase of infection. The lowest concentration of SARS-CoV-2 viral copies this assay can detect is 250 copies / mL. A negative result does not preclude SARS-CoV-2 infection and should not be used as the sole basis for treatment or other patient management decisions.  A negative result may occur with improper specimen collection / handling, submission of specimen other than nasopharyngeal swab, presence of viral mutation(s) within the areas targeted by this assay, and inadequate number of viral copies (<250 copies / mL). A negative result must be combined with clinical observations, patient history, and epidemiological information.  Fact Sheet for Patients:   StrictlyIdeas.no  Fact Sheet for Healthcare Providers: BankingDealers.co.za  This test is not yet approved or  cleared by the [place] FDA and has been authorized for detection and/or diagnosis of SARS-CoV-2 by FDA under an Emergency Use Authorization (EUA).  This EUA will remain in effect  (meaning this test can be used) for the duration of the COVID-19 declaration under Section 564(b)(1) of the Act, 21 U.S.C. section 360bbb-3(b)(1), unless the authorization is terminated or revoked sooner.  Performed at [facility], [place] [address] [place], [place] [address]      Labs: Basic Metabolic Panel: Recent Labs  Lab 04/13/20 1433 04/13/20 1755 04/14/20 0355 04/15/20 0715 04/16/20 0703  NA 136  --  138 135 139  K 3.9  --  4.0 4.1 4.0  CL 95*  --  96* 94* 93*  CO2 25  --  33* 31 34*  GLUCOSE 199*  --  164* 215* 177*  BUN 16  --  17 15 23*  CREATININE 1.15  --  1.13 1.13 1.38*  CALCIUM 9.1  --  9.1  9.3 9.6  MG  --  2.2  --  2.0  --   PHOS  --   --   --  3.0  --    Liver Function Tests: Recent Labs  Lab 04/13/20 1433 04/14/20 0355  AST 37 18  ALT 41 33  ALKPHOS 50 41  BILITOT 0.5 0.4  PROT 6.1* 5.4*  ALBUMIN 3.5 3.0*   No results for input(s): LIPASE, AMYLASE in the last 168 hours. No results for input(s): AMMONIA in the last 168 hours. CBC: Recent Labs  Lab 04/13/20 1433 04/14/20 0355  WBC 8.8 7.8  HGB 12.2* 12.0*  HCT 38.9* 37.3*  MCV 97.3 95.6  PLT 279 257   Cardiac Enzymes: No results for input(s): CKTOTAL, CKMB, CKMBINDEX, TROPONINI in the last 168 hours. BNP: BNP (last 3 results) Recent Labs    03/08/20 1435 04/06/20 2011 04/13/20 1755  BNP 716.9* 1,472.0* 1,267.7*    ProBNP (last 3 results) No results for input(s): PROBNP in the last 8760 hours.  CBG: Recent Labs  Lab 04/15/20 0622 04/15/20 1118 04/15/20 1605 04/15/20 2155 04/16/20 0610  GLUCAP 214* 178* 218* 195* 180*       Signed:  Kayleen Memos, MD [facility] 04/16/2020, 1:39 PM

## 2020-04-16 NOTE — Progress Notes (Signed)
Kenneth [name] to be D/C'd Home per MD order.  Discussed with the patient and all questions fully answered.   VSS, Skin clean, dry and intact without evidence of skin break down, no evidence of skin tears noted. IV catheter discontinued intact. Site without signs and symptoms of complications. Dressing and pressure applied.   An After Visit Summary was printed and given to the patient.    D/C education completed with patient/family including follow up instructions, medication list, d/c activities limitations if indicated, with other d/c instructions as indicated by MD - patient able to verbalize understanding, all questions fully answered.    Patient instructed to return to ED, call 911, or call MD for any changes in condition.    Patient escorted via WC, and D/C home via car.

## 2020-04-16 NOTE — Plan of Care (Signed)
Pt understanding of discharge plan  ?

## 2020-04-16 NOTE — Progress Notes (Signed)
Patient has been doing CPT on his own with a flutter.

## 2020-04-18 ENCOUNTER — Telehealth: Payer: Self-pay

## 2020-04-18 NOTE — Telephone Encounter (Signed)
Transition Care Management Follow-up Telephone Call  Date of discharge and from where: 04/16/20-[facility]  How have you been since you were released from the hospital? Feeling better-Resting  Any questions or concerns? No   Items Reviewed:  Did the pt receive and understand the discharge instructions provided? Yes   Medications obtained and verified? Yes   Any new allergies since your discharge? No   Dietary orders reviewed? Yes  Do you have support at home? Yes   Functional Questionnaire: (I = Independent and D = Dependent) ADLs: I  Bathing/Dressing- I- with assistance  Meal Prep- I-with assistance  Eating- I  Maintaining continence- I  Transferring/Ambulation- I  Managing Meds- I  Follow up appointments reviewed:   PCP Hospital f/u appt confirmed? Yes  Scheduled to see Dr. Doreene Burke on 04/24/20 @ 11am.  Specialist [facility] f/u appt confirmed? No  Patient aware to call cardiology to schedule  Are transportation arrangements needed? No   If their condition worsens, is the pt aware to call PCP or go to the Emergency Dept.? Yes  Was the patient provided with contact information for the PCP's office or ED? Yes  Was to pt encouraged to call back with questions or concerns? Yes

## 2020-04-19 DIAGNOSIS — J449 Chronic obstructive pulmonary disease, unspecified: Secondary | ICD-10-CM | POA: Diagnosis not present

## 2020-04-20 ENCOUNTER — Other Ambulatory Visit: Payer: Self-pay | Admitting: Nurse Practitioner

## 2020-04-20 ENCOUNTER — Other Ambulatory Visit: Payer: Self-pay | Admitting: Cardiology

## 2020-04-20 ENCOUNTER — Other Ambulatory Visit: Payer: Self-pay | Admitting: Family Medicine

## 2020-04-20 ENCOUNTER — Telehealth: Payer: Self-pay | Admitting: Family Medicine

## 2020-04-20 DIAGNOSIS — K5909 Other constipation: Secondary | ICD-10-CM

## 2020-04-20 NOTE — Telephone Encounter (Signed)
Patient called and is requesting a refill for isosorbide mononitrate and lubiprostone sent to [facility] on [address], please advise [phone].

## 2020-04-23 ENCOUNTER — Ambulatory Visit: Payer: Self-pay | Admitting: *Deleted

## 2020-04-23 ENCOUNTER — Other Ambulatory Visit: Payer: Self-pay | Admitting: *Deleted

## 2020-04-23 ENCOUNTER — Other Ambulatory Visit: Payer: Self-pay

## 2020-04-23 MED ORDER — APIXABAN 5 MG PO TABS: 5.0000 mg | ORAL_TABLET | Freq: Two times a day (BID) | ORAL | 1 refills | Status: DC

## 2020-04-23 MED ORDER — CARVEDILOL 12.5 MG PO TABS: 12.5000 mg | ORAL_TABLET | Freq: Two times a day (BID) | ORAL | 3 refills | Status: DC

## 2020-04-23 MED ORDER — ISOSORBIDE MONONITRATE ER 30 MG PO TB24: 30.0000 mg | ORAL_TABLET | Freq: Every day | ORAL | 3 refills | Status: DC

## 2020-04-23 MED ORDER — AMLODIPINE BESYLATE 5 MG PO TABS: 5.0000 mg | ORAL_TABLET | Freq: Every day | ORAL | 3 refills | Status: DC

## 2020-04-23 MED ORDER — LOSARTAN POTASSIUM 25 MG PO TABS: 25.0000 mg | ORAL_TABLET | Freq: Every day | ORAL | 3 refills | Status: DC

## 2020-04-23 NOTE — Telephone Encounter (Signed)
Last OV 03/22/2020 Last fill 03/22/20  #30/5

## 2020-04-23 NOTE — Telephone Encounter (Signed)
Rx sent in

## 2020-04-23 NOTE — Patient Outreach (Signed)
[facility]) Care Management  04/23/2020  Kenneth [name] 02-03-1961 [id number]   CSW attempted to reach pt by phone today and was unsuccessful.  CSW will plan to sign off per policy.  CSW will advise [facility] team and PCP of above.   [name], MSW, LCSW Clinical Social Worker  [facility] [employer] [phone]

## 2020-04-23 NOTE — Telephone Encounter (Signed)
Rx(s) sent to pharmacy electronically.  

## 2020-04-24 ENCOUNTER — Other Ambulatory Visit: Payer: Self-pay

## 2020-04-24 ENCOUNTER — Encounter: Payer: Self-pay | Admitting: Family Medicine

## 2020-04-24 ENCOUNTER — Telehealth: Payer: Self-pay | Admitting: Family Medicine

## 2020-04-24 ENCOUNTER — Ambulatory Visit (INDEPENDENT_AMBULATORY_CARE_PROVIDER_SITE_OTHER): Payer: Medicare Other | Admitting: Family Medicine

## 2020-04-24 VITALS — BP 150/78 | HR 65 | Temp 97.5°F | Ht 72.0 in | Wt 180.6 lb

## 2020-04-24 DIAGNOSIS — I10 Essential (primary) hypertension: Secondary | ICD-10-CM

## 2020-04-24 DIAGNOSIS — Z09 Encounter for follow-up examination after completed treatment for conditions other than malignant neoplasm: Secondary | ICD-10-CM

## 2020-04-24 DIAGNOSIS — E119 Type 2 diabetes mellitus without complications: Secondary | ICD-10-CM | POA: Diagnosis not present

## 2020-04-24 DIAGNOSIS — F1010 Alcohol abuse, uncomplicated: Secondary | ICD-10-CM

## 2020-04-24 DIAGNOSIS — J441 Chronic obstructive pulmonary disease with (acute) exacerbation: Secondary | ICD-10-CM

## 2020-04-24 DIAGNOSIS — D539 Nutritional anemia, unspecified: Secondary | ICD-10-CM

## 2020-04-24 DIAGNOSIS — E519 Thiamine deficiency, unspecified: Secondary | ICD-10-CM

## 2020-04-24 DIAGNOSIS — E1140 Type 2 diabetes mellitus with diabetic neuropathy, unspecified: Secondary | ICD-10-CM

## 2020-04-24 MED ORDER — GABAPENTIN 300 MG PO CAPS: 300.0000 mg | ORAL_CAPSULE | Freq: Three times a day (TID) | ORAL | 3 refills | Status: DC

## 2020-04-24 MED ORDER — LOSARTAN POTASSIUM 50 MG PO TABS: 50.0000 mg | ORAL_TABLET | Freq: Every day | ORAL | 3 refills | Status: DC

## 2020-04-24 NOTE — Progress Notes (Signed)
Established Patient Office Visit  Subjective:  Patient ID: Kenneth [name], male    DOB: 04/20/1961  Age: 59 y.o. MRN: [id number]  CC:  Chief Complaint  Patient presents with  . Hospitalization Follow-up    hospital f/u seen for SOB, no concerns.     HPI Kenneth [name] presents for hospital discharge follow-up for copd exacerbation, diabetes, anxiety, tobacco abuse, alcohol use and complains of tingling in his elbows hands and feet.  History of neuropathy involving these areas.  Seem to have resolved but has returned recently.  It had been treated with gabapentin in the past with good results.  Blood pressure continues to be elevated on current regimen.  Reports compliance with all of his medications.  Continues with Prozac, BuSpar and trazodone.  Review of hospital blood work shows a microcytic anemia and hemoglobin A1c of 6.4.  Continues to smoke tobacco, marijuana and drink alcohol.  Appointments for follow-up with pulmonology is next week.  Completed his prednisone course.  Breathing has improved but is not ideal.   Past Medical History:  Diagnosis Date  . Arthritis   . Asthma   . Atrial fibrillation ([place])   . CHF (congestive heart failure) ([place])   . Depression   . Diabetes mellitus without complication ([place])   . Emphysema of lung ([place])   . Heart murmur   . Hyperlipidemia   . Hypertension     History reviewed. No pertinent surgical history.  Family History  Problem Relation Age of Onset  . Diabetes Sister   . Coronary artery disease Brother     Social History   Socioeconomic History  . Marital status: Legally Separated    Spouse name: Not on file  . Number of children: Not on file  . Years of education: Not on file  . Highest education level: Not on file  Occupational History  . Not on file  Tobacco Use  . Smoking status: Current Some Day Smoker    Packs/day: 2.00    Years: 30.00    Pack years: 60.00    Types: Cigarettes  . Smokeless tobacco: Never Used  .  Tobacco comment: smokes 2-3cigs per day  Substance and Sexual Activity  . Alcohol use: Yes    Comment: Drinks up to six beers around a game  . Drug use: Yes    Types: Marijuana    Comment: occ marijuana  . Sexual activity: Yes    Partners: Female  Other Topics Concern  . Not on file  Social History Narrative  . Not on file   Social Determinants of Health   Financial Resource Strain:   . Difficulty of Paying Living Expenses:   Food Insecurity: No Food Insecurity  . Worried About Charity fundraiser in the Last Year: Never true  . Ran Out of Food in the Last Year: Never true  Transportation Needs: No Transportation Needs  . Lack of Transportation (Medical): No  . Lack of Transportation (Non-Medical): No  Physical Activity:   . Days of Exercise per Week:   . Minutes of Exercise per Session:   Stress:   . Feeling of Stress :   Social Connections:   . Frequency of Communication with Friends and Family:   . Frequency of Social Gatherings with Friends and Family:   . Attends Religious Services:   . Active Member of Clubs or Organizations:   . Attends Archivist Meetings:   [name] Marital Status:   Intimate Partner Violence:   .  Fear of Current or Ex-Partner:   . Emotionally Abused:   [name] Physically Abused:   . Sexually Abused:     Outpatient Medications Prior to Visit  Medication Sig Dispense Refill  . acetaminophen (TYLENOL) 325 MG tablet Take 650 mg by mouth every 6 (six) hours as needed for mild pain or headache.    . albuterol (PROVENTIL HFA;VENTOLIN HFA) 108 (90 Base) MCG/ACT inhaler Inhale 1-2 puffs into the lungs every 4 (four) hours as needed for wheezing or shortness of breath.     [name] albuterol (PROVENTIL) (2.5 MG/3ML) 0.083% nebulizer solution Take 3 mLs (2.5 mg total) by nebulization 5 (five) times daily as needed for wheezing or shortness of breath. 150 mL 3  . amLODipine (NORVASC) 5 MG tablet Take 1 tablet (5 mg total) by mouth daily. 90 tablet 3  . apixaban  (ELIQUIS) 5 MG TABS tablet Take 1 tablet (5 mg total) by mouth 2 (two) times daily. 180 tablet 1  . aspirin EC 81 MG tablet Take 81 mg by mouth daily.    . Blood Glucose Monitoring Suppl (ONE TOUCH ULTRA 2) w/Device KIT Use to test blood sugars 1-2 times daily. 1 kit 0  . busPIRone (BUSPAR) 15 MG tablet Take 1 tablet (15 mg total) by mouth 2 (two) times daily. 180 tablet 1  . carvedilol (COREG) 12.5 MG tablet Take 1 tablet (12.5 mg total) by mouth 2 (two) times daily with a meal. 180 tablet 3  . famotidine (PEPCID) 20 MG tablet TAKE 1 TABLET BY MOUTH  DAILY (Patient taking differently: Take 20 mg by mouth daily. ) 90 tablet 3  . FLUoxetine (PROZAC) 20 MG capsule TAKE 3 CAPSULES BY MOUTH  DAILY (Patient taking differently: Take 60 mg by mouth daily. ) 270 capsule 3  . Fluticasone-Umeclidin-Vilant (TRELEGY ELLIPTA) 100-62.5-25 MCG/INH AEPB Inhale 1 puff into the lungs daily. 60 each 5  . furosemide (LASIX) 20 MG tablet TAKE 1 TABLET BY MOUTH  DAILY (Patient taking differently: Take 20 mg by mouth daily. ) 90 tablet 3  . glucose blood (ONETOUCH ULTRA) test strip Use to test blood sugars 1-2 times daily. 100 each 12  . ipratropium (ATROVENT) 0.02 % nebulizer solution Take 2.5 mLs (0.5 mg total) by nebulization 4 (four) times daily. 75 mL 12  . isosorbide mononitrate (IMDUR) 30 MG 24 hr tablet Take 1 tablet (30 mg total) by mouth daily. 90 tablet 3  . Lancets (ONETOUCH ULTRASOFT) lancets Use to test blood sugars 1-2 times daily. 100 each 12  . lubiprostone ([name]) 8 MCG capsule TAKE 1 CAPSULE(8 MCG) BY MOUTH TWICE DAILY WITH A MEAL 30 capsule 5  . metFORMIN (GLUCOPHAGE) 500 MG tablet TAKE 1 TABLET BY MOUTH  TWICE DAILY WITH MEALS (Patient taking differently: Take 500 mg by mouth 2 (two) times daily with a meal. ) 180 tablet 3  . Multiple Vitamin (MULTIVITAMIN WITH MINERALS) TABS tablet Take 1 tablet by mouth daily.    . nicotine polacrilex (NICORETTE) 4 MG gum Take 4 mg by mouth as needed for smoking  cessation.    . nitroGLYCERIN (NITROSTAT) 0.4 MG SL tablet DISSOLVE 1 TABLET UNDER THE TONGUE EVERY 5 MINUTES AS  NEEDED FOR CHEST PAIN. MAX  OF 3 TABLETS IN 15 MINUTES. CALL 911 IF PAIN PERSISTS. (Patient taking differently: Place 0.4 mg under the tongue every 5 (five) minutes as needed for chest pain. ) 75 tablet 4  . pantoprazole (PROTONIX) 20 MG tablet Take 1 tablet (20 mg total) by mouth daily.  90 tablet 3  . traZODone (DESYREL) 50 MG tablet TAKE 1/2 TO 1 TABLET BY  MOUTH AT BEDTIME AS NEEDED  FOR SLEEP. (Patient taking differently: Take 50 mg by mouth at bedtime as needed for sleep. ) 30 tablet 11  . losartan (COZAAR) 25 MG tablet Take 1 tablet (25 mg total) by mouth daily. 90 tablet 3  . diphenhydrAMINE (BENADRYL) 25 MG tablet Take 50 mg by mouth every 6 (six) hours as needed for itching or allergies. (Patient not taking: Reported on 04/24/2020)    . predniSONE (DELTASONE) 10 MG tablet 40 mg x 2 days then 30 mg x 2 days then 20 mg x 2 days then 10 mg x 2 days then stop (Patient not taking: Reported on 04/24/2020) 20 tablet 0   No facility-administered medications prior to visit.    Allergies  Allergen Reactions  . Lisinopril Swelling  . Other Other (See Comments)    Lettuce : rash  . Tomato Rash    ROS Review of Systems  Constitutional: Negative.  Negative for chills and fever.  HENT: Negative.   Eyes: Negative for photophobia.  Respiratory: Positive for shortness of breath. Negative for chest tightness and wheezing.   Gastrointestinal: Negative.   Endocrine: Negative for polyphagia and polyuria.  Genitourinary: Negative for difficulty urinating, frequency and urgency.  Musculoskeletal: Negative for gait problem and joint swelling.  Skin: Negative for pallor and rash.  Allergic/Immunologic: Negative for immunocompromised state.  Neurological: Negative for speech difficulty and light-headedness.  Hematological: Does not bruise/bleed easily.  Psychiatric/Behavioral: Negative.        Objective:    Physical Exam Vitals and nursing note reviewed.  Constitutional:      General: He is not in acute distress.    Appearance: Normal appearance. He is normal weight. He is not ill-appearing, toxic-appearing or diaphoretic.  HENT:     Head: Normocephalic and atraumatic.     Right Ear: External ear normal.     Left Ear: External ear normal.  Eyes:     General: No scleral icterus.       Right eye: No discharge.        Left eye: No discharge.     Conjunctiva/sclera: Conjunctivae normal.  Cardiovascular:     Rate and Rhythm: Normal rate and regular rhythm.     Pulses:          Dorsalis pedis pulses are 2+ on the right side and 2+ on the left side.       Posterior tibial pulses are 1+ on the right side and 1+ on the left side.  Pulmonary:     Effort: Pulmonary effort is normal.     Breath sounds: Decreased breath sounds present. No wheezing, rhonchi or rales.  Abdominal:     General: Bowel sounds are normal.  Musculoskeletal:     Cervical back: Normal range of motion and neck supple. No rigidity or tenderness.     Right lower leg: No edema.     Left lower leg: No edema.  Lymphadenopathy:     Cervical: No cervical adenopathy.  Skin:    General: Skin is warm and dry.  Neurological:     Mental Status: He is alert and oriented to person, place, and time.  Psychiatric:        Mood and Affect: Mood normal.        Behavior: Behavior normal.    Diabetic Foot Exam - Simple   Simple Foot Form Visual Inspection See comments: Yes  Sensation Testing Intact to touch and monofilament testing bilaterally: Yes Pulse Check Posterior Tibialis and Dorsalis pulse intact bilaterally: Yes Comments  Nails of great toes are thick and oncotic.  Arches are relaxed bilaterally.  There were no lesions or rashes.    BP (!) 150/78   Pulse 65   Temp (!) 97.5 F (36.4 C) (Tympanic)   Ht 6' (1.829 m)   Wt 180 lb 9.6 oz (81.9 kg)   SpO2 94%   BMI 24.49 kg/m  Wt Readings from  Last 3 Encounters:  04/24/20 180 lb 9.6 oz (81.9 kg)  04/16/20 168 lb 4.8 oz (76.3 kg)  04/07/20 166 lb 3.6 oz (75.4 kg)     Health Maintenance Due  Topic Date Due  . FOOT EXAM  Never done  . COLONOSCOPY  09/23/2019  . INFLUENZA VACCINE  04/22/2020    There are no preventive care reminders to display for this patient.  Lab Results  Component Value Date   TSH 4.32 07/06/2018   Lab Results  Component Value Date   WBC 7.8 04/14/2020   HGB 12.0 (L) 04/14/2020   HCT 37.3 (L) 04/14/2020   MCV 95.6 04/14/2020   PLT 257 04/14/2020   Lab Results  Component Value Date   NA 139 04/16/2020   K 4.0 04/16/2020   CO2 34 (H) 04/16/2020   GLUCOSE 177 (H) 04/16/2020   BUN 23 (H) 04/16/2020   CREATININE 1.38 (H) 04/16/2020   BILITOT 0.4 04/14/2020   ALKPHOS 41 04/14/2020   AST 18 04/14/2020   ALT 33 04/14/2020   PROT 5.4 (L) 04/14/2020   ALBUMIN 3.0 (L) 04/14/2020   CALCIUM 9.6 04/16/2020   ANIONGAP 12 04/16/2020   GFR 88.57 02/14/2020   Lab Results  Component Value Date   CHOL 226 (H) 07/06/2018   Lab Results  Component Value Date   HDL 68.40 07/06/2018   Lab Results  Component Value Date   LDLCALC 119 (H) 07/06/2018   Lab Results  Component Value Date   TRIG 190.0 (H) 07/06/2018   Lab Results  Component Value Date   CHOLHDL 3 07/06/2018   Lab Results  Component Value Date   HGBA1C 6.4 (H) 04/13/2020      Assessment & Plan:   Problem List Items Addressed This Visit      Cardiovascular and Mediastinum   Essential hypertension - Primary   Relevant Medications   losartan (COZAAR) 50 MG tablet     Respiratory   Chronic obstructive pulmonary disease with acute exacerbation (HCC)     Endocrine   Controlled type 2 diabetes mellitus without complication, without long-term current use of insulin (HCC)   Relevant Medications   losartan (COZAAR) 50 MG tablet   Type 2 diabetes mellitus with diabetic neuropathy, without long-term current use of insulin (HCC)    Relevant Medications   losartan (COZAAR) 50 MG tablet   gabapentin (NEURONTIN) 300 MG capsule   Other Relevant Orders   B12 and Folate Panel   TSH   Vitamin B1     Other   Macrocytic anemia   Relevant Orders   B12 and Folate Panel   TSH   Alcohol abuse   Relevant Orders   Vitamin B1   Hospital discharge follow-up    Other Visit Diagnoses    Thiamine deficiency          Meds ordered this encounter  Medications  . losartan (COZAAR) 50 MG tablet    Sig: Take 1 tablet (50 mg  total) by mouth daily.    Dispense:  90 tablet    Refill:  3  . gabapentin (NEURONTIN) 300 MG capsule    Sig: Take 1 capsule (300 mg total) by mouth 3 (three) times daily.    Dispense:  90 capsule    Refill:  3    Follow-up: Return in about 3 months (around 07/25/2020), or Please stop smoking and drinking alcohol.   Have increased losartan to 50 mg.  He will also follow-up with cardiology. [name], MD

## 2020-04-24 NOTE — Telephone Encounter (Signed)
Patient is calling and requesting a refill for Ellipta, Incruse and Trelogy sent to [facility] on [address], please advise. CB is [phone]

## 2020-04-24 NOTE — Telephone Encounter (Signed)
Medication that patient is requesting refill on was sent in with refills by another doctor. Called patient to infomr him of this and see if he have spoken with his pharmacist. No answer LMTCB

## 2020-04-30 ENCOUNTER — Ambulatory Visit (INDEPENDENT_AMBULATORY_CARE_PROVIDER_SITE_OTHER): Payer: Medicare Other | Admitting: Physician Assistant

## 2020-04-30 ENCOUNTER — Encounter: Payer: Self-pay | Admitting: Physician Assistant

## 2020-04-30 VITALS — BP 154/70 | HR 64 | Ht 70.0 in | Wt 175.5 lb

## 2020-04-30 DIAGNOSIS — K5909 Other constipation: Secondary | ICD-10-CM

## 2020-04-30 DIAGNOSIS — K5900 Constipation, unspecified: Secondary | ICD-10-CM

## 2020-04-30 DIAGNOSIS — K219 Gastro-esophageal reflux disease without esophagitis: Secondary | ICD-10-CM | POA: Diagnosis not present

## 2020-04-30 DIAGNOSIS — R142 Eructation: Secondary | ICD-10-CM

## 2020-04-30 DIAGNOSIS — J449 Chronic obstructive pulmonary disease, unspecified: Secondary | ICD-10-CM | POA: Diagnosis not present

## 2020-04-30 MED ORDER — PANTOPRAZOLE SODIUM 40 MG PO TBEC: 40.0000 mg | DELAYED_RELEASE_TABLET | Freq: Every morning | ORAL | 8 refills | Status: DC

## 2020-04-30 MED ORDER — LUBIPROSTONE 8 MCG PO CAPS: ORAL_CAPSULE | ORAL | 8 refills | Status: DC

## 2020-04-30 NOTE — Patient Instructions (Addendum)
If you are age 59 or older, your body mass index should be between 23-30. Your Body mass index is 25.18 kg/m. If this is out of the aforementioned range listed, please consider follow up with your Primary Care Provider.  If you are age 85 or younger, your body mass index should be between 19-25. Your Body mass index is 25.18 kg/m. If this is out of the aformentioned range listed, please consider follow up with your Primary Care Provider.   INCREASE your Pantoprazole to 40 mg every morning. A new prescription has been sent to your pharmacy  We have sent in refills for your [name] to the pharmacy.  User Gas-x with each meal as needed for gas.  Drink plenty of fluids, at least 64 ounces of water a day.  Call and ask for Beth if your Valinda Hoar is working.  Follow up as needed.  Your appointment with Dr. Cristal Deer has been moved up to 05/03/20 at 8 am.

## 2020-04-30 NOTE — Progress Notes (Signed)
Subjective:    Patient ID: Kenneth [name], male    DOB: [birth year], 59 y.o.   MRN: [id number]  HPI Kenneth [name] is a pleasant 59 year old African-American male, new to GI today, and referred by Dr. Abelino Derrick with complaints of chronic constipation.  Patient relates recent ER visit with constipation earlier this summer and was subsequently started on [name] 8 mcg twice daily.  Patient has had several ER visits over the past few months and very recent admission for COPD exacerbation/exacerbation of congestive heart failure and was just discharged on 04/16/2020. He has history of hypertension, atrial fibrillation, congestive heart failure with EF 40 to 45%, COPD with chronic respiratory failure, adult onset diabetes mellitus. He had colonoscopy done in 2016 per Dr. Marin Comment at [facility], for screening and this revealed left colon diverticulosis, grade 2 internal hemorrhoids and no polyps.  He is indicated for 10-year interval follow-up. Patient says he has been on [name] over the past month or so and says it has been somewhat helpful.  He actually had run out of medication and just restarted a couple of days ago and says he feels much better at this point and has been having bowel movements on a daily basis.  He is not aware of any melena or hematochezia. He has been having episodes of "gas", and tightness in his chest which have been occurring generally at rest.  He says if he can belch or burp sometimes this relieves the symptoms.  He has been using nitroglycerin for these episodes and says if he takes the nitroglycerin the pain subsides.  He says he cannot tell if the symptoms are coming from his heart or are GI related.  He describes a frequent bloated tight kind of feeling in his chest. He does have pulmonary consultation later this week, feels that he needs to be on home O2.  He had follow-up with cardiology scheduled after hospitalization but not until October.  Review of Systems Pertinent positive and  negative review of systems were noted in the above HPI section.  All other review of systems was otherwise negative.  Outpatient Encounter Medications as of 04/30/2020  Medication Sig   acetaminophen (TYLENOL) 325 MG tablet Take 650 mg by mouth every 6 (six) hours as needed for mild pain or headache.   albuterol (PROVENTIL HFA;VENTOLIN HFA) 108 (90 Base) MCG/ACT inhaler Inhale 1-2 puffs into the lungs every 4 (four) hours as needed for wheezing or shortness of breath.    albuterol (PROVENTIL) (2.5 MG/3ML) 0.083% nebulizer solution Take 3 mLs (2.5 mg total) by nebulization 5 (five) times daily as needed for wheezing or shortness of breath.   amLODipine (NORVASC) 5 MG tablet Take 1 tablet (5 mg total) by mouth daily.   apixaban (ELIQUIS) 5 MG TABS tablet Take 1 tablet (5 mg total) by mouth 2 (two) times daily.   aspirin EC 81 MG tablet Take 81 mg by mouth daily.   Blood Glucose Monitoring Suppl (ONE TOUCH ULTRA 2) w/Device KIT Use to test blood sugars 1-2 times daily.   busPIRone (BUSPAR) 15 MG tablet Take 1 tablet (15 mg total) by mouth 2 (two) times daily.   carvedilol (COREG) 12.5 MG tablet Take 1 tablet (12.5 mg total) by mouth 2 (two) times daily with a meal.   diphenhydrAMINE (BENADRYL) 25 MG tablet Take 50 mg by mouth every 6 (six) hours as needed for itching or allergies.    famotidine (PEPCID) 20 MG tablet TAKE 1 TABLET BY MOUTH  DAILY (Patient  taking differently: Take 20 mg by mouth daily. )   FLUoxetine (PROZAC) 20 MG capsule TAKE 3 CAPSULES BY MOUTH  DAILY (Patient taking differently: Take 60 mg by mouth daily. )   Fluticasone-Umeclidin-Vilant (TRELEGY ELLIPTA) 100-62.5-25 MCG/INH AEPB Inhale 1 puff into the lungs daily.   furosemide (LASIX) 20 MG tablet TAKE 1 TABLET BY MOUTH  DAILY (Patient taking differently: Take 20 mg by mouth daily. )   gabapentin (NEURONTIN) 300 MG capsule Take 1 capsule (300 mg total) by mouth 3 (three) times daily.   glucose blood (ONETOUCH ULTRA)  test strip Use to test blood sugars 1-2 times daily.   ipratropium (ATROVENT) 0.02 % nebulizer solution Take 2.5 mLs (0.5 mg total) by nebulization 4 (four) times daily.   isosorbide mononitrate (IMDUR) 30 MG 24 hr tablet Take 1 tablet (30 mg total) by mouth daily.   Lancets (ONETOUCH ULTRASOFT) lancets Use to test blood sugars 1-2 times daily.   losartan (COZAAR) 25 MG tablet Take 25 mg by mouth daily.   losartan (COZAAR) 50 MG tablet Take 1 tablet (50 mg total) by mouth daily.   lubiprostone ([name]) 8 MCG capsule TAKE 1 CAPSULE(8 MCG) BY MOUTH TWICE DAILY WITH A MEAL   metFORMIN (GLUCOPHAGE) 500 MG tablet TAKE 1 TABLET BY MOUTH  TWICE DAILY WITH MEALS (Patient taking differently: Take 500 mg by mouth 2 (two) times daily with a meal. )   Multiple Vitamin (MULTIVITAMIN WITH MINERALS) TABS tablet Take 1 tablet by mouth daily.   nicotine polacrilex (NICORETTE) 4 MG gum Take 4 mg by mouth as needed for smoking cessation.   nitroGLYCERIN (NITROSTAT) 0.4 MG SL tablet DISSOLVE 1 TABLET UNDER THE TONGUE EVERY 5 MINUTES AS  NEEDED FOR CHEST PAIN. MAX  OF 3 TABLETS IN 15 MINUTES. CALL 911 IF PAIN PERSISTS. (Patient taking differently: Place 0.4 mg under the tongue every 5 (five) minutes as needed for chest pain. )   pantoprazole (PROTONIX) 40 MG tablet Take 1 tablet (40 mg total) by mouth in the morning.   traZODone (DESYREL) 50 MG tablet TAKE 1/2 TO 1 TABLET BY  MOUTH AT BEDTIME AS NEEDED  FOR SLEEP. (Patient taking differently: Take 50 mg by mouth at bedtime as needed for sleep. )   [DISCONTINUED] lubiprostone ([name]) 8 MCG capsule TAKE 1 CAPSULE(8 MCG) BY MOUTH TWICE DAILY WITH A MEAL   [DISCONTINUED] pantoprazole (PROTONIX) 20 MG tablet Take 1 tablet (20 mg total) by mouth daily.   No facility-administered encounter medications on file as of 04/30/2020.   Allergies  Allergen Reactions   Lisinopril Swelling   Other Other (See Comments)    Lettuce : rash   Tomato Rash   Patient  Active Problem List   Diagnosis Date Noted   Type 2 diabetes mellitus with diabetic neuropathy, without long-term current use of insulin ([place]) 04/24/2020   Hyperlipidemia    Chronic obstructive pulmonary disease with acute exacerbation ([place]) 04/07/2020   Acute on chronic combined systolic and diastolic CHF (congestive heart failure) ([place]) 04/07/2020   Acute respiratory failure with hypoxia ([place]) 04/06/2020   Acute on chronic respiratory failure with hypoxemia ([facility]) 02/08/2020   Chronic constipation 07/19/2019   Moderate persistent asthma with acute exacerbation 06/17/2019   Hospital discharge follow-up 06/17/2019   Shortness of breath 06/12/2019   Chest tightness 06/12/2019   Snoring 03/29/2019   Insomnia due to other mental disorder 11/15/2018   Anticoagulant long-term use 10/21/2018   Gastroesophageal reflux disease without esophagitis 08/10/2018   Tobacco abuse 08/10/2018  Chest pain 07/27/2018   PAF (paroxysmal atrial fibrillation) ([place]) 07/27/2018   Mild renal insufficiency 07/27/2018   Chronic combined systolic and diastolic heart failure ([place]) 07/27/2018   Slow transit constipation 07/20/2018   Essential hypertension 07/06/2018   Chronic obstructive pulmonary disease ([place]) 07/06/2018   Controlled type 2 diabetes mellitus without complication, without long-term current use of insulin ([place]) 07/06/2018   Depression 07/06/2018   Macrocytic anemia 07/06/2018   Healthcare maintenance 07/06/2018   Alcohol abuse 07/06/2018   Social History   Socioeconomic History   Marital status: Legally Separated    Spouse name: Not on file   Number of children: 1   Years of education: Not on file   Highest education level: Not on file  Occupational History   Occupation: disabled  Tobacco Use   Smoking status: Former Smoker    Packs/day: 2.00    Years: 30.00    Pack years: 60.00    Types: Cigarettes    Quit date: 05/01/2019    Years since quitting: 1.0    Smokeless tobacco: Never Used   Tobacco comment: smokes 2-3cigs per day  Vaping Use   Vaping Use: Never used  Substance and Sexual Activity   Alcohol use: Yes    Comment: Drinks up to six beers around a game   Drug use: Yes    Types: Marijuana    Comment: occ marijuana   Sexual activity: Yes    Partners: Female  Other Topics Concern   Not on file  Social History Narrative   Not on file   Social Determinants of Health   Financial Resource Strain:    Difficulty of Paying Living Expenses:   Food Insecurity: No Food Insecurity   Worried About Running Out of Food in the Last Year: Never true   Ran Out of Food in the Last Year: Never true  Transportation Needs: No Transportation Needs   Lack of Transportation (Medical): No   Lack of Transportation (Non-Medical): No  Physical Activity:    Days of Exercise per Week:    Minutes of Exercise per Session:   Stress:    Feeling of Stress :   Social Connections:    Frequency of Communication with Friends and Family:    Frequency of Social Gatherings with Friends and Family:    Attends Religious Services:    Active Member of Clubs or Organizations:    Attends Music therapist:    Marital Status:   Intimate Partner Violence:    Fear of Current or Ex-Partner:    Emotionally Abused:    Physically Abused:    Sexually Abused:     Kenneth [name] family history includes Coronary artery disease in his brother; Diabetes in his father, mother, and sister; Heart disease in his father, mother, and sister; Kidney disease in his sister; Liver disease in his maternal uncle.      Objective:    Vitals:   04/30/20 0944  BP: (!) 154/70  Pulse: 64    Physical Exam Well-developed well-nourished older African-American male in no acute distress.  Height, Weight, 175 BMI 25.1  HEENT; nontraumatic normocephalic, EOMI, PER R LA, sclera anicteric. Oropharynx; not done Neck; supple, no JVD Cardiovascular;  regular rate and rhythm with S1-S2, no murmur rub or gallop Pulmonary; significantly decreased breath sounds bilaterally Abdomen; soft, nontender, nondistended, no palpable mass or hepatosplenomegaly, bowel sounds are active Rectal; not done today Skin; benign exam, no jaundice rash or appreciable lesions Extremities; no clubbing cyanosis or edema skin  warm and dry Neuro/Psych; alert and oriented x4, grossly nonfocal mood and affect appropriate       Assessment & Plan:   #76  59 year old African-American male with chronic constipation. Currently doing well on [name] 8 mcg twice daily as long as he stays on medication. Negative screening colonoscopy 2016/[facility] with finding of diverticulosis and internal hemorrhoids, no polyps. 2.  Several recent ER visits and recent hospitalization with COPD exacerbation/CHF exacerbation. Patient is relating frequent episodes of bloating and gas/tightness in his chest relieved by nitroglycerin and sometimes relieved by belching. I am concerned that the symptoms are anginal rather than related to GERD. 3.  Atrial fibrillation 4.  Hypertension 5.  Adult onset diabetes mellitus  Plan continue [name] 8 mcg p.o. twice daily, several refills have been sent.  Patient advised to call if the [name] is not continuing to be effective and at that time we can increase to 24 mcg. We discussed higher fiber diet and liberal water intake/60 ounces per day. Can try Gas-X AC meals. Increase Protonix to 40 mg p.o. daily, every morning AC breakfast Await pulmonary consultation later this week. We have contacted cardiology and have obtained an appointment with Dr. Al Pimple for later this week. Patient will be established with Dr. Ancil Linsey PA-C 04/30/2020   Cc: Flossie Buffy, NP

## 2020-05-01 ENCOUNTER — Telehealth: Payer: Medicare Other | Admitting: Cardiology

## 2020-05-01 ENCOUNTER — Telehealth: Payer: Self-pay

## 2020-05-01 NOTE — Telephone Encounter (Signed)
Nurse attempted to call patient on both numbers x3 for patients scheduled virtual visit this morning. Patient unable to be reached. Voicemail left with instructions on how to call back to office to reschedule appointment.

## 2020-05-02 ENCOUNTER — Other Ambulatory Visit: Payer: Self-pay

## 2020-05-02 ENCOUNTER — Encounter: Payer: Self-pay | Admitting: Emergency Medicine

## 2020-05-02 ENCOUNTER — Ambulatory Visit (INDEPENDENT_AMBULATORY_CARE_PROVIDER_SITE_OTHER): Payer: Medicare Other | Admitting: Emergency Medicine

## 2020-05-02 DIAGNOSIS — Z720 Tobacco use: Secondary | ICD-10-CM

## 2020-05-02 DIAGNOSIS — J441 Chronic obstructive pulmonary disease with (acute) exacerbation: Secondary | ICD-10-CM

## 2020-05-02 DIAGNOSIS — J9621 Acute and chronic respiratory failure with hypoxia: Secondary | ICD-10-CM

## 2020-05-02 DIAGNOSIS — G4733 Obstructive sleep apnea (adult) (pediatric): Secondary | ICD-10-CM

## 2020-05-02 DIAGNOSIS — R0602 Shortness of breath: Secondary | ICD-10-CM

## 2020-05-02 LAB — PULMONARY FUNCTION TEST
DL/VA % pred: 72 %
DL/VA: 3.09 ml/min/mmHg/L
DLCO cor % pred: 50 %
DLCO cor: 14.61 ml/min/mmHg
DLCO unc % pred: 50 %
DLCO unc: 14.61 ml/min/mmHg
FEF 25-75 Post: 0.38 L/sec
FEF 25-75 Pre: 0.33 L/sec
FEF2575-%Change-Post: 14 %
FEF2575-%Pred-Post: 12 %
FEF2575-%Pred-Pre: 10 %
FEV1-%Change-Post: 6 %
FEV1-%Pred-Post: 25 %
FEV1-%Pred-Pre: 24 %
FEV1-Post: 0.86 L
FEV1-Pre: 0.81 L
FEV1FVC-%Change-Post: -2 %
FEV1FVC-%Pred-Pre: 54 %
FEV6-%Change-Post: 10 %
FEV6-%Pred-Post: 50 %
FEV6-%Pred-Pre: 45 %
FEV6-Post: 2.03 L
FEV6-Pre: 1.84 L
FEV6FVC-%Change-Post: 2 %
FEV6FVC-%Pred-Post: 102 %
FEV6FVC-%Pred-Pre: 100 %
FVC-%Change-Post: 8 %
FVC-%Pred-Post: 48 %
FVC-%Pred-Pre: 45 %
FVC-Post: 2.06 L
FVC-Pre: 1.9 L
Post FEV1/FVC ratio: 42 %
Post FEV6/FVC ratio: 99 %
Pre FEV1/FVC ratio: 43 %
Pre FEV6/FVC Ratio: 97 %
RV % pred: 220 %
RV: 4.97 L
TLC % pred: 110 %
TLC: 7.93 L

## 2020-05-02 NOTE — Assessment & Plan Note (Signed)
States that he stopped 2 weeks ago.  I congratulated him on this and encouraged him to continue cessation.  He will likely be a candidate for lung cancer screening going forward.  We will discuss next

## 2020-05-02 NOTE — Assessment & Plan Note (Signed)
We will perform an overnight oximetry test on your CPAP. If your oxygen levels drop then we can order night-time oxygen for you. We will repeat your walking oximetry today.

## 2020-05-02 NOTE — Patient Instructions (Addendum)
We will perform an overnight oximetry test on your CPAP. If your oxygen levels drop then we can order night-time oxygen for you. Continue your CPAP every night as you have been using it We will repeat your walking oximetry today. We will continue Trelegy 1 inhalation once daily.  We will write a prescription for this today.  Rinse and gargle after using. Keep your albuterol available to use either 2 puffs or 1 nebulizer treatment up to every 4 hours when you need it for shortness of breath, chest tightness, wheezing. Congratulations on stopping smoking.  Do not restart! Follow with Dr Delton Coombes in 3 months or sooner if you have any problems.

## 2020-05-02 NOTE — Assessment & Plan Note (Signed)
On auto titration CPAP 13-20 cm of water.  Good compliance.  He states that he felt better when he had oxygen bled into his CPAP while he was admitted to the hospital.  He may qualify for this.  I could send him back to the sleep lab but may be able to confirm this diagnosis with an ONO while on his CPAP.  We will order this

## 2020-05-02 NOTE — Progress Notes (Signed)
Full PFT performed today. °

## 2020-05-02 NOTE — Progress Notes (Signed)
Subjective:    Patient ID: Kenneth [name], male    DOB: December 02, 1960, 59 y.o.   MRN: [id number]  HPI 59 year old smoker (50+ pack years) with a history of atrial fibrillation on anticoagulation, diabetes, hypertension, OSA on CPAP. He carries a history of asthma / COPD that was made many years ago. He is on Advair, uses albuterol several times a day, atrovent nebs qd.   He has been dealing with progressive dyspnea, often w exertion but can happen at rest as well. Happening over the last year. Lots of cough, usually dry. He hears wheeze most days. Has benefited from pred - frequent exacerbations.   He has good compliance with CPAP, an an auto-set 13-20cm H2O  ROV 05/02/20 --59 year old smoker with A. fib (anticoagulated), diabetes, hypertension, OSA on CPAP.  Seen in June for COPD/asthma.  Since I saw him he has been seen twice, admitted twice for cough and wheezing.  Treated with prednisone and antibiotics.  On the second hospitalization he was noted to have a significantly elevated BNP, chronic systolic CHF and was diuresed.  At our initial visit I stopped his Advair and start him on Trelegy to see if he would get more benefit.  He underwent pulmonary function testing today which I have reviewed, shows very severe obstruction without a bronchodilator response, restricted volumes (nitrogen washout) and a decreased diffusion capacity that does not fully correct for his alveolar volume. He feels that the Trelegy has helped some, still has exertional SOB and cough. When he was admitted they added O2 to his CPAP which he felt helped him. He has borrowed his partner's O2 but doesn't have it for his own device. He had desats to 89% w ambulation last time here. Uses albuterol 3-4x a day. He stopped smoking 2 weeks ago.     Review of Systems As per HPI        Objective:   Physical Exam  Vitals:   05/02/20 1030  BP: 134/80  Pulse: 79  Temp: 97.6 F (36.4 C)  SpO2: 96%  Weight: 173 lb (78.5 kg)    Height: 5\' 10"  (1.778 m)   Gen: Pleasant, well-nourished, in no distress,  normal affect  ENT: No lesions,  mouth clear,  oropharynx clear, no postnasal drip  Neck: No JVD, no stridor  Lungs: No use of accessory muscles, B exp wheeze more prominent with forced exp  Cardiovascular: RRR, heart sounds normal, no murmur or gallops, no peripheral edema  Musculoskeletal: No deformities, no cyanosis or clubbing  Neuro: alert, awake, non focal  Skin: Warm, no lesions or rash     Assessment & Plan:  Chronic obstructive pulmonary disease (HCC) Severe disease clinically and confirmed by pulmonary function testing today.  He also has a diffusion defect.  We will repeat his walking oximetry as below, test overnight oximetry as well.  He benefited from Trelegy and we will plan to continue.  We will continue Trelegy 1 inhalation once daily.  We will write a prescription for this today.  Rinse and gargle after using. Keep your albuterol available to use either 2 puffs or 1 nebulizer treatment up to every 4 hours when you need it for shortness of breath, chest tightness, wheezing. Follow with Dr in 3 months or sooner if you have any problems.  Acute on chronic respiratory failure with hypoxemia (HCC) We will perform an overnight oximetry test on your CPAP. If your oxygen levels drop then we can order night-time oxygen for you. We will repeat  your walking oximetry today.   Obstructive sleep apnea On auto titration CPAP 13-20 cm of water.  Good compliance.  He states that he felt better when he had oxygen bled into his CPAP while he was admitted to the hospital.  He may qualify for this.  I could send him back to the sleep lab but may be able to confirm this diagnosis with an ONO while on his CPAP.  We will order this  Tobacco abuse States that he stopped 2 weeks ago.  I congratulated him on this and encouraged him to continue cessation.  He will likely be a candidate for lung cancer  screening going forward.  We will discuss next  Levy Pupa, MD, PhD 05/02/2020, 11:02 AM [facility] [phone] or if no answer [phone]

## 2020-05-02 NOTE — Assessment & Plan Note (Signed)
Severe disease clinically and confirmed by pulmonary function testing today.  He also has a diffusion defect.  We will repeat his walking oximetry as below, test overnight oximetry as well.  He benefited from Trelegy and we will plan to continue.  We will continue Trelegy 1 inhalation once daily.  We will write a prescription for this today.  Rinse and gargle after using. Keep your albuterol available to use either 2 puffs or 1 nebulizer treatment up to every 4 hours when you need it for shortness of breath, chest tightness, wheezing. Follow with Dr Delton Coombes in 3 months or sooner if you have any problems.

## 2020-05-02 NOTE — Addendum Note (Signed)
Addended by: Dorisann Frames R on: 05/02/2020 11:09 AM   Modules accepted: Orders

## 2020-05-03 ENCOUNTER — Ambulatory Visit: Payer: Medicare Other | Admitting: Cardiology

## 2020-05-04 ENCOUNTER — Encounter: Payer: Self-pay | Admitting: Cardiology

## 2020-05-04 ENCOUNTER — Ambulatory Visit (INDEPENDENT_AMBULATORY_CARE_PROVIDER_SITE_OTHER): Payer: Medicare Other | Admitting: Cardiology

## 2020-05-04 ENCOUNTER — Other Ambulatory Visit: Payer: Self-pay

## 2020-05-04 VITALS — BP 148/70 | HR 78 | Temp 97.0°F | Ht 71.5 in | Wt 178.4 lb

## 2020-05-04 DIAGNOSIS — J449 Chronic obstructive pulmonary disease, unspecified: Secondary | ICD-10-CM

## 2020-05-04 DIAGNOSIS — I480 Paroxysmal atrial fibrillation: Secondary | ICD-10-CM | POA: Diagnosis not present

## 2020-05-04 DIAGNOSIS — I10 Essential (primary) hypertension: Secondary | ICD-10-CM | POA: Diagnosis not present

## 2020-05-04 DIAGNOSIS — E1140 Type 2 diabetes mellitus with diabetic neuropathy, unspecified: Secondary | ICD-10-CM

## 2020-05-04 DIAGNOSIS — G4733 Obstructive sleep apnea (adult) (pediatric): Secondary | ICD-10-CM | POA: Diagnosis not present

## 2020-05-04 DIAGNOSIS — I5042 Chronic combined systolic (congestive) and diastolic (congestive) heart failure: Secondary | ICD-10-CM | POA: Diagnosis not present

## 2020-05-04 DIAGNOSIS — N529 Male erectile dysfunction, unspecified: Secondary | ICD-10-CM

## 2020-05-04 DIAGNOSIS — Z7189 Other specified counseling: Secondary | ICD-10-CM

## 2020-05-04 NOTE — Progress Notes (Addendum)
Cardiology Office Note:    Date:  05/04/2020   ID:  Kenneth [name], DOB Oct 11, 1960, MRN [id number]  PCP:  [name], MD  Cardiologist:  Buford Dresser, MD PhD  Referring MD: [name],*   CC: follow up  History of Present Illness:    Kenneth [name] is a 60 y.o. male with a hx of hypertension, diabetes, tobacco use, non-ischemic cardiomyopathy with EF 30-35%, COPD who is seen for follow up today. I initially saw him 10/21/18 as a new patient to me at the request of [name],* for the evaluation and management of paroxysmal atrial fibrillation.  Cardiac history: he followed with a cardiologist in [place], then in [place], but he recently moved back to the area. Per care everywhere notes: history of angioedema on ACEi, was on losartan and tolerating in 2016; however in [place] he was not on ARB but hydralazine instead. Cath/echo in 2011 showed normal coronaries, reduced LV function with EF 40%. He was admitted for COPD and HF exacerbation in 11/2017 in [facility], and at that time he was on pradaxa for anticoagulation. He was seen by Roderic Palau in the afib clinic in 07/2018, started on apixaban and carvedilol uptitrated. Echo repeated 08/2018, below.   Today: Most recently seen by Jory Sims 03/20/20. Since that time, he was admitted 04/06/20-04/08/20 as well as 7/23-7/26/21. Both felt to be COPD exacerbation. His lasix was held during one admission, which resulted in leg swelling, but this resolved once lasix restarted. Still feels "gassed out." Saw pulmonary yesterday, doesn't meet criteria for O2 at home yet. Sometimes uses his partner's CPAP with O2 at night and this helps him. Has upcoming repeat sleep study, as he has CPAP now but no O2 on it. On his COPD inhalers. Had green phlegm about a week ago, but now clear. No fevers.  Congratulated on tobacco cessation.   Did extensive med rec today. Taking lasix once a day with good results. Taking 50  mg losartan (had both 50 and 25 mg doses listed).   Weights have been largely steady around 174 lbs. Had one episode when it peaked at 180 lbs, but had no other other symptoms, and resolved on its own.   Has not noticed any afib. Tolerated apixaban without bleeding issues.   On check of echo during hospitalization, EF improved to 45-50%. Had discussed entresto previously, but after discussion, will continue on ARB (prior angioedema on ACEi). Tolerating carvedilol 12.5 mg BID, losartan 50 mg daily, furosemide 20 mg daily.  Still very short of breath with minimal activity, even taking a hot shower. Has very rare chest pain, last several days ago. Last event was right after waking up, coughing up phlegm, had chest tightness. Took a nitro and it got better. Can't recall last time he needed nitro prior, "once in a blue moon."  Asking about ED medications. Discussed risk of PDE5i and nitroglycerin today. He will discuss with his PCP.  Checks BP at home. Systolic 948 before meds, 120s-130s after medications. Hasn't had medications yet this AM.  Denies orthopnea, LE edema or unexpected weight gain. No syncope or palpitations. Occasional PND, better with CPAP with O2.   Past Medical History:  Diagnosis Date  . Anxiety   . Arthritis   . Asthma   . Atrial fibrillation ([place])   . CHF (congestive heart failure) ([place])   . COPD (chronic obstructive pulmonary disease) ([place])   . Depression   . Diabetes mellitus without complication ([place])   .  Emphysema of lung ([place])   . Heart murmur   . Hyperlipidemia   . Hypertension   . Sleep apnea with use of continuous positive airway pressure (CPAP)     Past Surgical History:  Procedure Laterality Date  . NO PAST SURGERIES      Current Medications: Current Outpatient Medications on File Prior to Visit  Medication Sig  . acetaminophen (TYLENOL) 325 MG tablet Take 650 mg by mouth every 6 (six) hours as needed for mild pain or headache.  . albuterol  (PROVENTIL HFA;VENTOLIN HFA) 108 (90 Base) MCG/ACT inhaler Inhale 1-2 puffs into the lungs every 4 (four) hours as needed for wheezing or shortness of breath.   [name] albuterol (PROVENTIL) (2.5 MG/3ML) 0.083% nebulizer solution Take 3 mLs (2.5 mg total) by nebulization 5 (five) times daily as needed for wheezing or shortness of breath.  [name] amLODipine (NORVASC) 5 MG tablet Take 1 tablet (5 mg total) by mouth daily.  [name] apixaban (ELIQUIS) 5 MG TABS tablet Take 1 tablet (5 mg total) by mouth 2 (two) times daily.  [name] aspirin EC 81 MG tablet Take 81 mg by mouth daily.  . Blood Glucose Monitoring Suppl (ONE TOUCH ULTRA 2) w/Device KIT Use to test blood sugars 1-2 times daily.  . busPIRone (BUSPAR) 15 MG tablet Take 1 tablet (15 mg total) by mouth 2 (two) times daily.  . carvedilol (COREG) 12.5 MG tablet Take 1 tablet (12.5 mg total) by mouth 2 (two) times daily with a meal.  . diphenhydrAMINE (BENADRYL) 25 MG tablet Take 50 mg by mouth every 6 (six) hours as needed for itching or allergies.   . famotidine (PEPCID) 20 MG tablet TAKE 1 TABLET BY MOUTH  DAILY (Patient taking differently: Take 20 mg by mouth daily. )  . FLUoxetine (PROZAC) 20 MG capsule TAKE 3 CAPSULES BY MOUTH  DAILY (Patient taking differently: Take 60 mg by mouth daily. )  . Fluticasone-Umeclidin-Vilant (TRELEGY ELLIPTA) 100-62.5-25 MCG/INH AEPB Inhale 1 puff into the lungs daily.  . furosemide (LASIX) 20 MG tablet TAKE 1 TABLET BY MOUTH  DAILY (Patient taking differently: Take 20 mg by mouth daily. )  . gabapentin (NEURONTIN) 300 MG capsule Take 1 capsule (300 mg total) by mouth 3 (three) times daily.  [name] glucose blood (ONETOUCH ULTRA) test strip Use to test blood sugars 1-2 times daily.  [name] ipratropium (ATROVENT) 0.02 % nebulizer solution Take 2.5 mLs (0.5 mg total) by nebulization 4 (four) times daily.  . isosorbide mononitrate (IMDUR) 30 MG 24 hr tablet Take 1 tablet (30 mg total) by mouth daily.  . Lancets (ONETOUCH ULTRASOFT) lancets Use to  test blood sugars 1-2 times daily.  [name] losartan (COZAAR) 50 MG tablet Take 1 tablet (50 mg total) by mouth daily.  [name] lubiprostone ([name]) 8 MCG capsule TAKE 1 CAPSULE(8 MCG) BY MOUTH TWICE DAILY WITH A MEAL  . metFORMIN (GLUCOPHAGE) 500 MG tablet TAKE 1 TABLET BY MOUTH  TWICE DAILY WITH MEALS (Patient taking differently: Take 500 mg by mouth 2 (two) times daily with a meal. )  . Multiple Vitamin (MULTIVITAMIN WITH MINERALS) TABS tablet Take 1 tablet by mouth daily.  . nicotine polacrilex (NICORETTE) 4 MG gum Take 4 mg by mouth as needed for smoking cessation.  . nitroGLYCERIN (NITROSTAT) 0.4 MG SL tablet DISSOLVE 1 TABLET UNDER THE TONGUE EVERY 5 MINUTES AS  NEEDED FOR CHEST PAIN. MAX  OF 3 TABLETS IN 15 MINUTES. CALL 911 IF PAIN PERSISTS. (Patient taking differently: Place 0.4 mg under the  tongue every 5 (five) minutes as needed for chest pain. )  . pantoprazole (PROTONIX) 40 MG tablet Take 1 tablet (40 mg total) by mouth in the morning.  . traZODone (DESYREL) 50 MG tablet TAKE 1/2 TO 1 TABLET BY  MOUTH AT BEDTIME AS NEEDED  FOR SLEEP. (Patient taking differently: Take 50 mg by mouth at bedtime as needed for sleep. )   No current facility-administered medications on file prior to visit.     Allergies:   Lisinopril, Other, and Tomato   Social History   Tobacco Use  . Smoking status: Former Smoker    Packs/day: 2.00    Years: 30.00    Pack years: 60.00    Types: Cigarettes    Quit date: 05/01/2019    Years since quitting: 1.0  . Smokeless tobacco: Never Used  . Tobacco comment: smokes 2-3cigs per day  Vaping Use  . Vaping Use: Never used  Substance Use Topics  . Alcohol use: Yes    Comment: Drinks up to six beers around a game  . Drug use: Yes    Types: Marijuana    Comment: occ marijuana    Family History: The patient's family history includes Coronary artery disease in his brother; Diabetes in his father, mother, and sister; Heart disease in his father, mother, and sister;  Kidney disease in his sister; Liver disease in his maternal uncle. Father also had unknown heart disease. Mother had cancer.   ROS:   Please see the history of present illness.  Additional pertinent ROS otherwise unremarkable.  EKGs/Labs/Other Studies Reviewed:    The following studies were reviewed today: Echo 04/07/20 1. LVEF is mildly depressed with inferior hypokinesis. Compared to  previous echo, LVEF is improved. . Left ventricular ejection fraction, by  estimation, is 45 to 50%. The left ventricle has mildly decreased  function. The left ventricle demonstrates  regional wall motion abnormalities (see scoring diagram/findings for  description). The left ventricular internal cavity size was mildly  dilated. Indeterminate diastolic filling due to E-A fusion.  2. Right ventricular systolic function is normal. The right ventricular  size is normal. There is normal pulmonary artery systolic pressure.  3. The mitral valve is abnormal. Trivial mitral valve regurgitation.  4. The aortic valve is abnormal. Aortic valve regurgitation is mild. Mild  to moderate aortic valve sclerosis/calcification is present, without any  evidence of aortic stenosis.  5. The inferior vena cava is normal in size with greater than 50%  respiratory variability, suggesting right atrial pressure of 3 mmHg.   Echo 06/13/19 1. Left ventricular ejection fraction, by visual estimation, is 30 to  35%. The left ventricle has normal function. Normal left ventricular size.  There is moderately increased left ventricular hypertrophy.  2. Basal and mid inferolateral wall is abnormal.  3. Left ventricular diastolic Doppler parameters are indeterminate  pattern of LV diastolic filling.  4. Global right ventricle has normal systolic function.The right  ventricular size is normal. No increase in right ventricular wall  thickness.  5. Left atrial size was normal.  6. Right atrial size was normal.  7. The mitral  valve is normal in structure. Trace mitral valve  regurgitation. No evidence of mitral stenosis.  8. The tricuspid valve is normal in structure. Tricuspid valve  regurgitation is trivial.  9. The aortic valve is normal in structure. Aortic valve regurgitation is  mild to moderate by color flow Doppler. Mild aortic valve sclerosis  without stenosis.  10. The pulmonic valve was normal  in structure. Pulmonic valve  regurgitation is not visualized by color flow Doppler.  11. The inferior vena cava is normal in size with greater than 50%  respiratory variability, suggesting right atrial pressure of 3 mmHg.   Echo 09/21/18 Left ventricle: The cavity size was normal. Wall thickness was   normal. Systolic function was moderately to severely reduced. The   estimated ejection fraction was in the range of 30% to 35%.   Diffuse hypokinesis. There is hypokinesis of the inferior and   inferoseptal myocardium. Features are consistent with a   pseudonormal left ventricular filling pattern, with concomitant   abnormal relaxation and increased filling pressure (grade 2   diastolic dysfunction). - Aortic valve: Trileaflet; moderately thickened, moderately   calcified leaflets. There was mild regurgitation. - Left atrium: The atrium was moderately dilated. - Tricuspid valve: There was trivial regurgitation. - Pulmonary arteries: Systolic pressure was mildly increased. PA   peak pressure: 32 mm Hg (S).  EKG:  EKG is personally reviewed.  The ekg ordered 04/13/20 demonstrates sinus rhythm with LVH  Recent Labs: 04/13/2020: B Natriuretic Peptide 1,267.7 04/14/2020: ALT 33; Hemoglobin 12.0; Platelets 257 04/15/2020: Magnesium 2.0 04/16/2020: BUN 23; Creatinine, Ser 1.38; Potassium 4.0; Sodium 139  Recent Lipid Panel    Component Value Date/Time   CHOL 226 (H) 07/06/2018 1355   TRIG 190.0 (H) 07/06/2018 1355   HDL 68.40 07/06/2018 1355   CHOLHDL 3 07/06/2018 1355   VLDL 38.0 07/06/2018 1355   LDLCALC  119 (H) 07/06/2018 1355    Physical Exam:    VS:  BP (!) 148/70   Pulse 78   Temp (!) 97 F (36.1 C)   Ht 5' 11.5" (1.816 m)   Wt 178 lb 6.4 oz (80.9 kg)   SpO2 96%   BMI 24.53 kg/m     Wt Readings from Last 3 Encounters:  05/04/20 178 lb 6.4 oz (80.9 kg)  05/02/20 173 lb (78.5 kg)  04/30/20 175 lb 8 oz (79.6 kg)    GEN: Well nourished, well developed in no acute distress HEENT: Normal, moist mucous membranes NECK: No JVD CARDIAC: regular rhythm, normal S1 and S2, no rubs or gallops. No murmur. VASCULAR: Radial and DP pulses 2+ bilaterally. No carotid bruits RESPIRATORY:  Distant in lower lung fields without crackles. Bilateral expiratory wheeze/rhonchi in upper lung fields. ABDOMEN: Soft, non-tender, non-distended MUSCULOSKELETAL:  Ambulates independently SKIN: Warm and dry, no edema NEUROLOGIC:  Alert and oriented x 3. No focal neuro deficits noted. PSYCHIATRIC:  Normal affect   ASSESSMENT:    1. Chronic combined systolic and diastolic congestive heart failure ([place])   2. PAF (paroxysmal atrial fibrillation) ([place])   3. Obstructive sleep apnea   4. Essential hypertension   5. Chronic obstructive pulmonary disease, unspecified COPD type ([place])   6. Erectile dysfunction, unspecified erectile dysfunction type   7. Type 2 diabetes mellitus with diabetic neuropathy, without long-term current use of insulin ([place])   8. Cardiac risk counseling   9. Counseling on health promotion and disease prevention    PLAN:    Chronic systolic and diastolic heart failure: euvolemic today -EF improved on most recent echo to 45-50% -tolerating carvedilol 12.5 mg BID, watch for bradycardia -had angioedema on ACEi before. Tolerating losartan 50 mg daily, will continue. Have discussed entresto, but given increased risk of angioedema on this, will continue ARB. -continue daily weight, salt avoidance, fluid restriction -doing well on furosemide 20 mg daily  Paroxysmal atrial  fibrillation -CHA2DS2/VAS Stroke Risk Points=3 -continue  apixaban, denies bleeding issues  Hypertension: -elevated in office, but has not yet taken AM meds. Reports good control after meds -continue carvedilol, losartan, furosemide as above. Continue amlodipine 5 mg daily   Sleep apnea: -has CPAP, being re-evaluated to see if O2 in CPAP would help  Severe COPD: -followed by Dr. Lamonte Sakai, with two recent admissions for COPD exacerbations -congratulated on tobacco cessation  Erectile dysfunction: -asking about medications. Discussed that PDE5i are contraindicated with nitro meds. Would need to stop imdur and be very cautious with PRN SL NG. Discussed risk of severe low blood pressure. He will discuss further with his PCP.  Type II diabetes, with chronic systolic and diastolic dysfunction: -on metformin -have discussed SGLT2i, but cost is a concern for him. -we have previously discussed recommendation for statin given his risk and diabetes -he is on aspirin. Given that he is already on apixaban, does not need to also be on aspirin from a cardiovascular perspective. Will defer to Dr. Ethelene Hal if there are other indications for this.  Prevention and risk counseling -recommend heart healthy/Mediterranean diet, with whole grains, fruits, vegetable, fish, lean meats, nuts, and olive oil. Limit salt. -recommend moderate walking, 3-5 times/week for 30-50 minutes each session. Aim for at least 150 minutes.week. Goal should be pace of 3 miles/hours, or walking 1.5 miles in 30 minutes -recommend avoidance of tobacco products. Avoid excess alcohol. -ASCVD risk score: no known ASCVD The 10-year ASCVD risk score Mikey Bussing DC Jr., et al., 2013) is: 41.9%   Values used to calculate the score:     Age: 38 years     Sex: Male     Is Non-Hispanic African American: Yes     Diabetic: Yes     Tobacco smoker: Yes     Systolic Blood Pressure: 401 mmHg     Is BP treated: Yes     HDL Cholesterol: 68.4 mg/dL      Total Cholesterol: 226 mg/dL   Plan for follow up: 6 mos or sooner as needed  Buford Dresser, MD, PhD [place]  [facility]   Medication Adjustments/Labs and Tests Ordered: Current medicines are reviewed at length with the patient today.  Concerns regarding medicines are outlined above.  No orders of the defined types were placed in this encounter.  No orders of the defined types were placed in this encounter.   Patient Instructions  Medication Instructions:  Your Physician recommend you continue on your current medication as directed.    *If you need a refill on your cardiac medications before your next appointment, please call your pharmacy*   Lab Work: None  Testing/Procedures: None   Follow-Up: At [facility], you and your health needs are our priority.  As part of our continuing mission to provide you with exceptional heart care, we have created designated Provider Care Teams.  These Care Teams include your primary Cardiologist (physician) and Advanced Practice Providers (APPs -  Physician Assistants and Nurse Practitioners) who all work together to provide you with the care you need, when you need it.  We recommend signing up for the patient portal called "MyChart".  Sign up information is provided on this After Visit Summary.  MyChart is used to connect with patients for Virtual Visits (Telemedicine).  Patients are able to view lab/test results, encounter notes, upcoming appointments, etc.  Non-urgent messages can be sent to your provider as well.   To learn more about what you can do with MyChart, go to [email or web address].    Your next  appointment:   6 month(s)  The format for your next appointment:   In Person  Provider:   Buford Dresser, MD       Signed, Buford Dresser, MD PhD 05/04/2020 8:52 AM    [place]

## 2020-05-04 NOTE — Patient Instructions (Signed)

## 2020-05-07 DIAGNOSIS — G4733 Obstructive sleep apnea (adult) (pediatric): Secondary | ICD-10-CM | POA: Diagnosis not present

## 2020-05-09 DIAGNOSIS — R0683 Snoring: Secondary | ICD-10-CM | POA: Diagnosis not present

## 2020-05-09 DIAGNOSIS — G4730 Sleep apnea, unspecified: Secondary | ICD-10-CM | POA: Diagnosis not present

## 2020-05-10 ENCOUNTER — Other Ambulatory Visit: Payer: Self-pay | Admitting: *Deleted

## 2020-05-10 NOTE — Patient Outreach (Signed)
[facility]) Care Management  05/10/2020  Kenneth [name] [birth year] [id number]   Call placed to member to follow up on COPD management.  He report he is doing well, no ED visits/hospitalizations since 7/23.  State he is still having some shortness of breath, had home sleep study done and waiting on results.  He is hoping to have oxygen added to his CPAP, which he feel will improve his condition.  He has also stopped smoking using Nicotine gum.  Encouragement provided for achieving this goal.  Report he has continued to monitor his diet and weights as well, weights have been stable, denies any weight gain or swelling.  Since last outreach, he has followed up with PCP, GI, Cardiology, and Pulmonology, denies either have any issues/concerns.  Denies any urgent concerns at this time for himself, encouraged to contact this care manager with questions.  Will follow up within the next month.  Goals Addressed              This Visit's Progress   .  I want to get my breathing better and stay out of the hospital (pt-stated)   On track     CARE PLAN ENTRY (see longtitudinal plan of care for additional care plan information)  Current Barriers:  [name] Knowledge deficits related to basic understanding of COPD disease process . Knowledge deficits related to basic COPD self care/management   Case Manager Clinical Goal(s):  Over the next 28 days, patient will be able to verbalize understanding of COPD action plan and when to seek appropriate levels of medical care  Over the next 28 days, patient will engage in lite exercise as tolerated to build/regain stamina and strength and reduce shortness of breath through activity tolerance  Over the next 31 days, patient will not be hospitalized for COPD exacerbation   Interventions:   Provided patient with basic written and verbal COPD education on self care/management/and exacerbation prevention   Advised patient to self assesses COPD action  plan zone and make appointment with provider if in the yellow zone for 48 hours without improvement.  Provided patient with education about the role of exercise in the management of COPD  Advised patient to engage in light exercise as tolerated 3-5 days a week  Patient Self Care Activities:  . Takes medications as prescribed including inhalers . Self assesses COPD action plan zone and makes appointment with provider if in the yellow zone for 48 hours without improvement. . Utilizes infection prevention strategies to reduce risk of respiratory infection  . Does not contact provider office for questions/concerns  Initial goal documentation        [name], RN, MSN [facility] Care Management  [facility] Manager [phone]

## 2020-05-14 ENCOUNTER — Telehealth: Payer: Self-pay | Admitting: Emergency Medicine

## 2020-05-14 ENCOUNTER — Telehealth: Payer: Self-pay | Admitting: Family Medicine

## 2020-05-14 NOTE — Telephone Encounter (Signed)
Called and spoke with patient states that over the weekend he was gasping for air and couldn't breathe and sats were 80%. Ems came out and gave patient IV, did EKG and breathing treatments. He did not go to the hospital. Patient is feeling better today. Had patient check his oxygen and he is 91% right now on room air, no fever, SOB, productive cough with white/green sputum. States that he used his girlfriends oxygen with his CPAP and felt better with it and slept good and is asking if he can get set up with home oxygen. Patient was last seen 05/02/2020. Patient was walked at that visit and did not drop. Patient states he did the ONO Wednesday. Waiting to get those results from [employer] we have called and left message.  Dr. Delton Coombes please advise

## 2020-05-14 NOTE — Telephone Encounter (Signed)
I have already spoke with this patient waiting on response from Dr. Delton Coombes. As far as his results for his ONO Per [facility] :  just spoke to someone in Resp Therapy.  Person who does downloads is off today.  No one else there knows how to do it.  They will get download in the morning and send [place] results.

## 2020-05-14 NOTE — Telephone Encounter (Signed)
ATC patient, had to leave VM to return call.  Trying to f/u on low oxygen levels and sleep study results.   Primary Pulmonologist: Byrum Last office visit and with whom: 05/02/20, byrum What do we see them for (pulmonary problems): COPD, Chronic Respiratory Failure why hypoxemia, OSA Last OV assessment/plan:  Assessment & Plan:  Chronic obstructive pulmonary disease (HCC) Severe disease clinically and confirmed by pulmonary function testing today.  He also has a diffusion defect.  We will repeat his walking oximetry as below, test overnight oximetry as well.  He benefited from Trelegy and we will plan to continue.  We will continue Trelegy 1 inhalation once daily.  We will write a prescription for this today.  Rinse and gargle after using. Keep your albuterol available to use either 2 puffs or 1 nebulizer treatment up to every 4 hours when you need it for shortness of breath, chest tightness, wheezing. Follow with Dr Delton Coombes in 3 months or sooner if you have any problems.  Acute on chronic respiratory failure with hypoxemia (HCC) We will perform an overnight oximetry test on your CPAP. If your oxygen levels drop then we can order night-time oxygen for you. We will repeat your walking oximetry today.   Obstructive sleep apnea On auto titration CPAP 13-20 cm of water.  Good compliance.  He states that he felt better when he had oxygen bled into his CPAP while he was admitted to the hospital.  He may qualify for this.  I could send him back to the sleep lab but may be able to confirm this diagnosis with an ONO while on his CPAP.  We will order this  Tobacco abuse States that he stopped 2 weeks ago.  I congratulated him on this and encouraged him to continue cessation.  He will likely be a candidate for lung cancer screening going forward.  We will discuss next  Levy Pupa, MD, PhD 05/02/2020, 11:02 AM  Pulmonary and Critical Care [phone] or if no answer [phone]      Assessment & Plan Note by Leslye Peer, MD at 05/02/2020 11:02 AM Author: Leslye Peer, MD Author Type: Physician Filed: 05/02/2020 11:02 AM  Note Status: Written Cosign: Cosign Not Required Encounter Date: 05/02/2020  Problem: Tobacco abuse  Editor: Leslye Peer, MD (Physician)                 States that he stopped 2 weeks ago.  I congratulated him on this and encouraged him to continue cessation.  He will likely be a candidate for lung cancer screening going forward.  We will discuss next    Assessment & Plan Note by Leslye Peer, MD at 05/02/2020 11:00 AM Author: Leslye Peer, MD Author Type: Physician Filed: 05/02/2020 11:01 AM  Note Status: Written Cosign: Cosign Not Required Encounter Date: 05/02/2020  Problem: Obstructive sleep apnea  Editor: Leslye Peer, MD (Physician)                 On auto titration CPAP 13-20 cm of water.  Good compliance.  He states that he felt better when he had oxygen bled into his CPAP while he was admitted to the hospital.  He may qualify for this.  I could send him back to the sleep lab but may be able to confirm this diagnosis with an ONO while on his CPAP.  We will order this    Assessment & Plan Note by Leslye Peer, MD at 05/02/2020 10:59 AM  Author: Leslye Peer, MD Author Type: Physician Filed: 05/02/2020 11:00 AM  Note Status: Written Cosign: Cosign Not Required Encounter Date: 05/02/2020  Problem: Acute on chronic respiratory failure with hypoxemia [facility])  Editor: Leslye Peer, MD (Physician)                 We will perform an overnight oximetry test on your CPAP. If your oxygen levels drop then we can order night-time oxygen for you. We will repeat your walking oximetry today.     Assessment & Plan Note by Leslye Peer, MD at 05/02/2020 10:59 AM Author: Leslye Peer, MD Author Type: Physician Filed: 05/02/2020 10:59 AM  Note Status: Written Cosign: Cosign Not Required Encounter Date: 05/02/2020  Problem:  Chronic obstructive pulmonary disease [facility])  Editor: Leslye Peer, MD (Physician)                 Severe disease clinically and confirmed by pulmonary function testing today.  He also has a diffusion defect.  We will repeat his walking oximetry as below, test overnight oximetry as well.  He benefited from Trelegy and we will plan to continue.  We will continue Trelegy 1 inhalation once daily.  We will write a prescription for this today.  Rinse and gargle after using. Keep your albuterol available to use either 2 puffs or 1 nebulizer treatment up to every 4 hours when you need it for shortness of breath, chest tightness, wheezing. Follow with Dr Delton Coombes in 3 months or sooner if you have any problems.    Patient Instructions by Leslye Peer, MD at 05/02/2020 10:30 AM Author: Leslye Peer, MD Author Type: Physician Filed: 05/02/2020 10:58 AM  Note Status: Addendum Cosign: Cosign Not Required Encounter Date: 05/02/2020  Editor: Leslye Peer, MD (Physician)      Prior Versions: 1. Leslye Peer, MD (Physician) at 05/02/2020 10:58 AM - Addendum   2. Leslye Peer, MD (Physician) at 05/02/2020 10:54 AM - Signed      We will perform an overnight oximetry test on your CPAP. If your oxygen levels drop then we can order night-time oxygen for you. Continue your CPAP every night as you have been using it We will repeat your walking oximetry today. We will continue Trelegy 1 inhalation once daily.  We will write a prescription for this today.  Rinse and gargle after using. Keep your albuterol available to use either 2 puffs or 1 nebulizer treatment up to every 4 hours when you need it for shortness of breath, chest tightness, wheezing. Congratulations on stopping smoking.  Do not restart! Follow with Dr Delton Coombes in 3 months or sooner if you have any problems.     Instructions  We will perform an overnight oximetry test on your CPAP. If your oxygen levels drop then we can order night-time  oxygen for you. Continue your CPAP every night as you have been using it We will repeat your walking oximetry today. We will continue Trelegy 1 inhalation once daily.  We will write a prescription for this today.  Rinse and gargle after using. Keep your albuterol available to use either 2 puffs or 1 nebulizer treatment up to every 4 hours when you need it for shortness of breath, chest tightness, wheezing. Congratulations on stopping smoking.  Do not restart! Follow with Dr Delton Coombes in 3 months or sooner if you have any problems.       Was appointment offered to patient (explain)?  Reason for call: oxygen sats in the 80's per EMS, wanting home sleep study results  (examples of things to ask: : When did symptoms start? Fever? Cough? Productive? Color to sputum? More sputum than usual? Wheezing? Have you needed increased oxygen? Are you taking your respiratory medications? What over the counter measures have you tried?)  Allergies  Allergen Reactions  . Lisinopril Swelling  . Other Other (See Comments)    Lettuce : rash  . Tomato Rash    Immunization History  Administered Date(s) Administered  . Influenza,inj,Quad PF,6+ Mos 06/25/2018, 05/20/2019  . PFIZER SARS-COV-2 Vaccination 12/22/2019, 01/16/2020  . Pneumococcal Polysaccharide-23 06/08/2016  . Zoster Recombinat (Shingrix) 07/22/2019

## 2020-05-14 NOTE — Telephone Encounter (Signed)
Patient called after hours service 05/13/20 5:30 pm and stated He was seen by EMS today for difficulty breathing. He wanted [place] to know that he needs assistance.  I called patient this morning. He informed me that when EMS was there, his 02 was in the 80s and he will be calling his pulmonary doctor to start on oxygen.

## 2020-05-14 NOTE — Telephone Encounter (Signed)
Spoke with patient who's calling to see if we would call and inform Dr. Delton Coombes that EMS had to come out to patients home and check his oxygen levels. Patient also wanting [place] to go over results from his visit with Dr. Delton Coombes. Advised patient to give them a call just in case he has a question for them

## 2020-05-15 NOTE — Telephone Encounter (Signed)
Called and left message for patient to return call on 05/16/2020.

## 2020-05-15 NOTE — Telephone Encounter (Signed)
He did not desaturate with exertion in the office at his last office visit.  If he is having hypoxemia on CPAP and we should be able to address once his ONO is available.  If he has documentation that he is experiencing exertional hypoxemia with his own oximeter then he would need to either be seen here as an acute visit or be seen in the emergency department.

## 2020-05-15 NOTE — Telephone Encounter (Signed)
We do not received the results of the oxygen study   Patient is using girlfriends CPAP that has oxygen attached to it. Patient using his CPAP mask with girlfriends tubing.   I will route to PCC's to see if they know who to contact to get download for patient, he states his oxygen during day goes to 90% but is very fatigued.   Dr. Delton Coombes do you have any recommendations for patient's breathing to improve while waiting results on ONO?  Dr. Delton Coombes please advise

## 2020-05-16 NOTE — Telephone Encounter (Signed)
Called and spoke with pt letting him know the info stated by Dr. Delton Coombes and he verbalized understanding. Nothing further needed at this time.

## 2020-05-17 ENCOUNTER — Telehealth: Payer: Self-pay | Admitting: Emergency Medicine

## 2020-05-21 ENCOUNTER — Ambulatory Visit: Payer: Medicare Other | Admitting: Primary Care

## 2020-05-22 ENCOUNTER — Other Ambulatory Visit: Payer: Self-pay | Admitting: Family Medicine

## 2020-05-22 ENCOUNTER — Telehealth: Payer: Self-pay | Admitting: Emergency Medicine

## 2020-05-22 ENCOUNTER — Other Ambulatory Visit: Payer: Self-pay | Admitting: Nurse Practitioner

## 2020-05-22 DIAGNOSIS — K5909 Other constipation: Secondary | ICD-10-CM

## 2020-05-22 NOTE — Telephone Encounter (Signed)
Attempted to call pt but unable to reach. Left message for him to return call. °

## 2020-05-22 NOTE — Telephone Encounter (Signed)
Encounter created in error. Will sign off.  

## 2020-05-23 NOTE — Telephone Encounter (Signed)
Pt returned call. Pt is scheduled for an appt with TP tomorrow 9/2 at 9:30. Asked pt when he had the ONO performed and he stated that it has been at least 2 weeks. Stated to pt that we would locate the results and stated that TP could discuss results during OV tomorrow 9/2 and he verbalized understanding.  Went to locate ONO results and I was not able to locate them. Called Melissa with [employer] to see when pt had ONO performed and she stated it was 05/09/20. Asked her to fax results over as pt has OV with TP tomorrow 9/2 and she said she would have results faxed over in TP's attn.  Will keep encounter open while we await results.

## 2020-05-23 NOTE — Telephone Encounter (Signed)
ATC patient.  Left VM for patient to return call.

## 2020-05-23 NOTE — Telephone Encounter (Signed)
Pt returning a phone call about ono results. Pt can be reached at [phone]

## 2020-05-24 ENCOUNTER — Other Ambulatory Visit: Payer: Self-pay

## 2020-05-24 ENCOUNTER — Encounter: Payer: Self-pay | Admitting: Adult Health

## 2020-05-24 ENCOUNTER — Ambulatory Visit (INDEPENDENT_AMBULATORY_CARE_PROVIDER_SITE_OTHER): Payer: Medicare Other | Admitting: Adult Health

## 2020-05-24 DIAGNOSIS — J449 Chronic obstructive pulmonary disease, unspecified: Secondary | ICD-10-CM

## 2020-05-24 DIAGNOSIS — G4733 Obstructive sleep apnea (adult) (pediatric): Secondary | ICD-10-CM | POA: Diagnosis not present

## 2020-05-24 MED ORDER — PREDNISONE 10 MG PO TABS: ORAL_TABLET | ORAL | 0 refills | Status: DC

## 2020-05-24 MED ORDER — DOXYCYCLINE HYCLATE 100 MG PO TABS: 100.0000 mg | ORAL_TABLET | Freq: Two times a day (BID) | ORAL | 0 refills | Status: DC

## 2020-05-24 NOTE — Progress Notes (Signed)
_0  ID: Kenneth [name], male    DOB: 1961/08/02, 59 y.o.   MRN: [id number]  Chief Complaint  Patient presents with  . Follow-up    COPD     Referring provider: [name]  HPI: 59 year old male former smoker followed for COPD with asthma, obstructive sleep apnea on nocturnal CPAP (AutoSet 13 to 20 cm H2O) Medical history significant for A. fib on chronic anticoagulation, diabetes, hypertension, congestive heart failure  TEST/EVENTS :  Pulmonary function testing May 02, 2020 showed FEV1 at 25%, ratio 42, FVC 48%, no significant bronchodilator response.,  Severe mid flow obstruction with reversibility, DLCO 50%.  05/24/2020 Follow up : COPD  Patient presents for 3-week follow-up.  Patient says he was seen last visit he was prone to frequent COPD exacerbations.  He says he continues to have ongoing daily cough, congestion with thick mucus and intermittent wheezing.  Patient was set up for an overnight oximetry test done last visit.  Overnight oximetry on CPAP test did show nocturnal desaturations.  We discussed starting oxygen at bedtime with his CPAP.  However insurance and DME company will not approve this and declines oxygen starts requiring a CPAP titration study.  Patient denies any hemoptysis, chest pain, orthopnea, edema. He remains on TRELEGY daily . [name]   Allergies  Allergen Reactions  . Lisinopril Swelling  . Other Other (See Comments)    Lettuce : rash  . Tomato Rash    Immunization History  Administered Date(s) Administered  . Influenza,inj,Quad PF,6+ Mos 06/25/2018, 05/20/2019  . PFIZER SARS-COV-2 Vaccination 12/22/2019, 01/16/2020  . Pneumococcal Polysaccharide-23 06/08/2016  . Zoster Recombinat (Shingrix) 07/22/2019    Past Medical History:  Diagnosis Date  . Anxiety   . Arthritis   . Asthma   . Atrial fibrillation ([place])   . CHF (congestive heart failure) ([place])   . COPD (chronic obstructive pulmonary disease) ([place])   . Depression   .  Diabetes mellitus without complication ([place])   . Emphysema of lung ([place])   . Heart murmur   . Hyperlipidemia   . Hypertension   . Sleep apnea with use of continuous positive airway pressure (CPAP)     Tobacco History: Social History   Tobacco Use  Smoking Status Former Smoker  . Packs/day: 2.00  . Years: 30.00  . Pack years: 60.00  . Types: Cigarettes  . Quit date: 05/01/2019  . Years since quitting: 1.0  Smokeless Tobacco Never Used  Tobacco Comment   smokes 2-3cigs per day   Counseling given: Not Answered Comment: smokes 2-3cigs per day   Outpatient Medications Prior to Visit  Medication Sig Dispense Refill  . acetaminophen (TYLENOL) 325 MG tablet Take 650 mg by mouth every 6 (six) hours as needed for mild pain or headache.    . albuterol (PROVENTIL HFA;VENTOLIN HFA) 108 (90 Base) MCG/ACT inhaler Inhale 1-2 puffs into the lungs every 4 (four) hours as needed for wheezing or shortness of breath.     [name] albuterol (PROVENTIL) (2.5 MG/3ML) 0.083% nebulizer solution INHALE 1 VIAL VIA NEBULIZER 5 TIMES DAILY AS NEEDED FOR WHEEZING OR FOR SHORTNESS OF BREATH 150 mL 1  . amLODipine (NORVASC) 5 MG tablet Take 1 tablet (5 mg total) by mouth daily. 90 tablet 3  . apixaban (ELIQUIS) 5 MG TABS tablet Take 1 tablet (5 mg total) by mouth 2 (two) times daily. 180 tablet 1  . aspirin EC 81 MG tablet Take 81 mg by mouth daily.    . Blood Glucose Monitoring Suppl (  ONE TOUCH ULTRA 2) w/Device KIT Use to test blood sugars 1-2 times daily. 1 kit 0  . busPIRone (BUSPAR) 15 MG tablet Take 1 tablet (15 mg total) by mouth 2 (two) times daily. 180 tablet 1  . carvedilol (COREG) 12.5 MG tablet Take 1 tablet (12.5 mg total) by mouth 2 (two) times daily with a meal. 180 tablet 3  . diphenhydrAMINE (BENADRYL) 25 MG tablet Take 50 mg by mouth every 6 (six) hours as needed for itching or allergies.     . famotidine (PEPCID) 20 MG tablet TAKE 1 TABLET BY MOUTH  DAILY (Patient taking differently: Take 20 mg by  mouth daily. ) 90 tablet 3  . FLUoxetine (PROZAC) 20 MG capsule TAKE 3 CAPSULES BY MOUTH  DAILY (Patient taking differently: Take 60 mg by mouth daily. ) 270 capsule 3  . Fluticasone-Umeclidin-Vilant (TRELEGY ELLIPTA) 100-62.5-25 MCG/INH AEPB Inhale 1 puff into the lungs daily. 60 each 5  . furosemide (LASIX) 20 MG tablet TAKE 1 TABLET BY MOUTH  DAILY (Patient taking differently: Take 20 mg by mouth daily. ) 90 tablet 3  . gabapentin (NEURONTIN) 300 MG capsule Take 1 capsule (300 mg total) by mouth 3 (three) times daily. 90 capsule 3  . glucose blood (ONETOUCH ULTRA) test strip USE TO TEST BLOOD SUGAR 1-2 TIMES DAILY 100 each 11  . ipratropium (ATROVENT) 0.02 % nebulizer solution Take 2.5 mLs (0.5 mg total) by nebulization 4 (four) times daily. 75 mL 12  . isosorbide mononitrate (IMDUR) 30 MG 24 hr tablet Take 1 tablet (30 mg total) by mouth daily. 90 tablet 3  . Lancets (ONETOUCH ULTRASOFT) lancets USE TO TEST BLOOD SUGAR 1-2 TIMES DAILY 100 each 11  . losartan (COZAAR) 50 MG tablet Take 1 tablet (50 mg total) by mouth daily. 90 tablet 3  . lubiprostone ([name]) 8 MCG capsule TAKE 1 CAPSULE(8 MCG) BY MOUTH TWICE DAILY WITH A MEAL 60 capsule 8  . metFORMIN (GLUCOPHAGE) 500 MG tablet TAKE 1 TABLET BY MOUTH  TWICE DAILY WITH MEALS (Patient taking differently: Take 500 mg by mouth 2 (two) times daily with a meal. ) 180 tablet 3  . Multiple Vitamin (MULTIVITAMIN WITH MINERALS) TABS tablet Take 1 tablet by mouth daily.    . nicotine polacrilex (NICORETTE) 4 MG gum Take 4 mg by mouth as needed for smoking cessation.    . nitroGLYCERIN (NITROSTAT) 0.4 MG SL tablet DISSOLVE 1 TABLET UNDER THE TONGUE EVERY 5 MINUTES AS  NEEDED FOR CHEST PAIN. MAX  OF 3 TABLETS IN 15 MINUTES. CALL 911 IF PAIN PERSISTS. (Patient taking differently: Place 0.4 mg under the tongue every 5 (five) minutes as needed for chest pain. ) 75 tablet 4  . pantoprazole (PROTONIX) 40 MG tablet Take 1 tablet (40 mg total) by mouth in the  morning. 30 tablet 8  . traZODone (DESYREL) 50 MG tablet TAKE 1/2 TO 1 TABLET BY  MOUTH AT BEDTIME AS NEEDED  FOR SLEEP. (Patient taking differently: Take 50 mg by mouth at bedtime as needed for sleep. ) 30 tablet 11   No facility-administered medications prior to visit.     Review of Systems:   Constitutional:   No  weight loss, night sweats,  Fevers, chills, + fatigue, or  lassitude.  HEENT:   No headaches,  Difficulty swallowing,  Tooth/dental problems, or  Sore throat,                No sneezing, itching, ear ache,  +nasal congestion, post nasal  drip,   CV:  No chest pain,  Orthopnea, PND, swelling in lower extremities, anasarca, dizziness, palpitations, syncope.   GI  No heartburn, indigestion, abdominal pain, nausea, vomiting, diarrhea, change in bowel habits, loss of appetite, bloody stools.   Resp:   No chest wall deformity  Skin: no rash or lesions.  GU: no dysuria, change in color of urine, no urgency or frequency.  No flank pain, no hematuria   MS:  No joint pain or swelling.  No decreased range of motion.  No back pain.    Physical Exam  BP (!) 144/68 (BP Location: Left Arm, Cuff Size: Normal)   Pulse 72   Temp (!) 97.5 F (36.4 C) (Temporal)   Ht 6' (1.829 m)   Wt 178 lb 3.2 oz (80.8 kg)   SpO2 99% Comment: RA  BMI 24.17 kg/m   GEN: A/Ox3; pleasant , NAD, elderly    HEENT:  [place]/AT,    NOSE-clear, THROAT-clear, no lesions, no postnasal drip or exudate noted.   NECK:  Supple w/ fair ROM; no JVD; normal carotid impulses w/o bruits; no thyromegaly or nodules palpated; no lymphadenopathy.    RESP  Few trace wheezes  no accessory muscle use, no dullness to percussion  CARD:  RRR, no m/r/g, no peripheral edema, pulses intact, no cyanosis or clubbing.  GI:   Soft & nt; nml bowel sounds; no organomegaly or masses detected.   Musco: Warm bil, no deformities or joint swelling noted.   Neuro: alert, no focal deficits noted.    Skin: Warm, no lesions or  rashes    Lab Results:  CBC  ProBNP No results found for: PROBNP  Imaging: No results found.    PFT Results Latest Ref Rng & Units 05/02/2020  FVC-Pre L 1.90  FVC-Predicted Pre % 45  FVC-Post L 2.06  FVC-Predicted Post % 48  Pre FEV1/FVC % % 43  Post FEV1/FCV % % 42  FEV1-Pre L 0.81  FEV1-Predicted Pre % 24  FEV1-Post L 0.86  DLCO uncorrected ml/min/mmHg 14.61  DLCO UNC% % 50  DLCO corrected ml/min/mmHg 14.61  DLCO COR %Predicted % 50  DLVA Predicted % 72  TLC L 7.93  TLC % Predicted % 110  RV % Predicted % 220    No results found for: NITRICOXIDE      Assessment & Plan:   Chronic obstructive pulmonary disease (HCC) Prone to frequent COPD exacerbations.  Patient has very severe COPD with low pulmonary reserve. We will treat with antibiotics and steroids currently.  Have close follow-up in 1 week reevaluate oxygen needs as insurance and DME will not cover oxygen during an acute flare. Would like to start oxygen at bedtime with CPAP however as above insurance will not cover this as well as he requires a CPAP titration study.  Plan  Patient Instructions  Doxycycline 157m Twice daily  For 7 days , take with food, wear sunscreen.  Mucinex DM Twice daily  As needed  Cough/congestion.  Prednisone taper over next week.  Continue on TRELEGY 1 puff daily  Begin Oxygen 2l/m At bedtime .  Follow up with Dr. BLamonte Sakaior Cree Napoli NP in 1 weeks and As needed  Please contact office for sooner follow up if symptoms do not improve or worsen or seek emergency care       Obstructive sleep apnea Continue on nocturnal CPAP.     TRexene Edison NP 05/24/2020

## 2020-05-24 NOTE — Telephone Encounter (Signed)
ONO has not been received yet. Called [employer] and spoke with [place] and was advised she will re-fax another copy to the front fax to [place] attention. Kenneth [name] has been made aware. Nothing further needed at this time.

## 2020-05-24 NOTE — Assessment & Plan Note (Signed)
Prone to frequent COPD exacerbations.  Patient has very severe COPD with low pulmonary reserve. We will treat with antibiotics and steroids currently.  Have close follow-up in 1 week reevaluate oxygen needs as insurance and DME will not cover oxygen during an acute flare. Would like to start oxygen at bedtime with CPAP however as above insurance will not cover this as well as he requires a CPAP titration study.  Plan  Patient Instructions  Doxycycline 100mg  Twice daily  For 7 days , take with food, wear sunscreen.  Mucinex DM Twice daily  As needed  Cough/congestion.  Prednisone taper over next week.  Continue on TRELEGY 1 puff daily  Begin Oxygen 2l/m At bedtime .  Follow up with Dr. or Effa Yarrow NP in 1 weeks and As needed  Please contact office for sooner follow up if symptoms do not improve or worsen or seek emergency care

## 2020-05-24 NOTE — Assessment & Plan Note (Signed)
Continue on nocturnal CPAP. 

## 2020-05-24 NOTE — Patient Instructions (Addendum)
Doxycycline 100mg  Twice daily  For 7 days , take with food, wear sunscreen.  Mucinex DM Twice daily  As needed  Cough/congestion.  Prednisone taper over next week.  Continue on TRELEGY 1 puff daily  Begin Oxygen 2l/m At bedtime .  Follow up with Dr. or Shaun Zuccaro NP in 1 weeks and As needed  Please contact office for sooner follow up if symptoms do not improve or worsen or seek emergency care

## 2020-06-01 ENCOUNTER — Ambulatory Visit (INDEPENDENT_AMBULATORY_CARE_PROVIDER_SITE_OTHER): Payer: Medicare Other | Admitting: Adult Health

## 2020-06-01 ENCOUNTER — Ambulatory Visit (INDEPENDENT_AMBULATORY_CARE_PROVIDER_SITE_OTHER): Payer: Medicare Other

## 2020-06-01 ENCOUNTER — Encounter: Payer: Self-pay | Admitting: Adult Health

## 2020-06-01 ENCOUNTER — Other Ambulatory Visit: Payer: Self-pay

## 2020-06-01 VITALS — BP 148/62 | HR 75 | Temp 97.7°F | Ht 72.0 in | Wt 185.8 lb

## 2020-06-01 DIAGNOSIS — G4733 Obstructive sleep apnea (adult) (pediatric): Secondary | ICD-10-CM

## 2020-06-01 DIAGNOSIS — J449 Chronic obstructive pulmonary disease, unspecified: Secondary | ICD-10-CM | POA: Diagnosis not present

## 2020-06-01 DIAGNOSIS — I5042 Chronic combined systolic (congestive) and diastolic (congestive) heart failure: Secondary | ICD-10-CM | POA: Diagnosis not present

## 2020-06-01 DIAGNOSIS — J441 Chronic obstructive pulmonary disease with (acute) exacerbation: Secondary | ICD-10-CM | POA: Diagnosis not present

## 2020-06-01 DIAGNOSIS — J9611 Chronic respiratory failure with hypoxia: Secondary | ICD-10-CM | POA: Diagnosis not present

## 2020-06-01 MED ORDER — PREDNISONE 10 MG PO TABS: ORAL_TABLET | ORAL | 0 refills | Status: DC

## 2020-06-01 MED ORDER — DOXYCYCLINE HYCLATE 100 MG PO TABS: 100.0000 mg | ORAL_TABLET | Freq: Two times a day (BID) | ORAL | 0 refills | Status: DC

## 2020-06-01 NOTE — Patient Instructions (Signed)
Extend 100mg  Twice daily  For 3 days , take with food, wear sunscreen.  Mucinex DM Twice daily  As needed  Cough/congestion.  Prednisone taper over next week.  Call if blood sugars are >250.  Continue on TRELEGY 1 puff daily  Albuterol inhaler or neb As needed   Begin Oxygen 2l/m At bedtime with CPAP  Continue on CPAP At bedtime  .  Follow up with Dr. or Shyler Hamill NP in 6 weeks and As needed  Please contact office for sooner follow up if symptoms do not improve or worsen or seek emergency care

## 2020-06-01 NOTE — Assessment & Plan Note (Signed)
Continue on nocturnal CPAP.  We will add in 2 L of oxygen with his CPAP.  Plan  Patient Instructions  Extend 100mg  Twice daily  For 3 days , take with food, wear sunscreen.  Mucinex DM Twice daily  As needed  Cough/congestion.  Prednisone taper over next week.  Call if blood sugars are >250.  Continue on TRELEGY 1 puff daily  Albuterol inhaler or neb As needed   Begin Oxygen 2l/m At bedtime with CPAP  Continue on CPAP At bedtime  .  Follow up with Dr. or Kathern Lobosco NP in 6 weeks and As needed  Please contact office for sooner follow up if symptoms do not improve or worsen or seek emergency care    '

## 2020-06-01 NOTE — Assessment & Plan Note (Addendum)
Slow to resolve COPD exacerbation.  Patient has significant symptom burden and takes prolonged time for recovery after activity. We will extend current antibiotics out for additional 3 days and prednisone taper for another week. Continue on triple therapy with Trelegy inhaler.  Use albuterol nebulizer as needed. On return visit consider referral to pulmonary rehab. Chest x-ray today with no acute process noted  Patient was given an albuterol nebulizer treatment in the office today as he had significant symptom burden and increased work of breathing after walk test.  Patient took 5 minutes to recover but had improved aeration and decreased work of breathing with no accessory muscle use after albuterol nebulizer.  O2 saturations remained 99 100% on room air.  Patient advised if symptoms do not improve or worsen he is to seek emergency room care.  Plan  Patient Instructions  Extend 100mg  Twice daily  For 3 days , take with food, wear sunscreen.  Mucinex DM Twice daily  As needed  Cough/congestion.  Prednisone taper over next week.  Call if blood sugars are >250.  Continue on TRELEGY 1 puff daily  Albuterol inhaler or neb As needed   Begin Oxygen 2l/m At bedtime with CPAP  Continue on CPAP At bedtime  .  Follow up with Dr. or Dalexa Gentz NP in 6 weeks and As needed  Please contact office for sooner follow up if symptoms do not improve or worsen or seek emergency care

## 2020-06-01 NOTE — Progress Notes (Signed)
@Patient  ID: Kenneth [name], male    DOB: Nov 07, 1960, 59 y.o.   MRN: [id number]  Chief Complaint  Patient presents with  . Follow-up    COPD     Referring provider: [name]  HPI: 59 year old male former smoker followed for COPD with asthma, obstructive sleep apnea on nocturnal CPAP (AutoSet 13 to 20 cm H2O) Medical history significant for A. fib on chronic anticoagulation, diabetes, hypertension, congestive heart failure   TEST/EVENTS :  Pulmonary function testing May 02, 2020 showed FEV1 at 25%, ratio 42, FVC 48%, no significant bronchodilator response.,  Severe mid flow obstruction with reversibility, DLCO 50%.  2D echo April 07, 2020 EF 45 to 50%, normal pulmonary artery systolic pressure  05/24/4096 Follow up : COPD  Patient presents for 1 week follow-up.  Patient was seen last visit for a COPD exacerbation, he was given doxycycline and a prednisone taper.  Patient says he is only had minimal improvement in symptoms.  Continues to have thick mucus and increased shortness of breath.  He gets winded with minimal activity.  Walk test today in the office showed no desaturations with O2 saturations 99 to 100% on room air.  However patient has significant difficulty breathing.  He did require a albuterol nebulizer treatment in the office with improvement in his work of breathing and aeration.  Patient's previous pulmonary function testing showed very severe COPD with FEV1 at 25%. He remains on Trelegy inhaler daily.  Uses albuterol as needed. He is prone to frequent exacerbations and hospitalizations.  Patient has underlying obstructive sleep apnea is on nocturnal CPAP.  He had a CPAP titration study done last year that showed desaturations on optimal pressure at 14 cm H2O with good control however O2 saturations at 85%.  An overnight oximetry test was done recently that showed nocturnal desaturations on CPAP.  Patient was recommended to begin oxygen with his CPAP.         Allergies  Allergen Reactions  . Lisinopril Swelling  . Other Other (See Comments)    Lettuce : rash  . Tomato Rash    Immunization History  Administered Date(s) Administered  . Influenza,inj,Quad PF,6+ Mos 06/25/2018, 05/20/2019  . PFIZER SARS-COV-2 Vaccination 12/22/2019, 01/16/2020  . Pneumococcal Polysaccharide-23 06/08/2016  . Zoster Recombinat (Shingrix) 07/22/2019    Past Medical History:  Diagnosis Date  . Anxiety   . Arthritis   . Asthma   . Atrial fibrillation ([place])   . CHF (congestive heart failure) ([place])   . COPD (chronic obstructive pulmonary disease) ([place])   . Depression   . Diabetes mellitus without complication ([place])   . Emphysema of lung ([place])   . Heart murmur   . Hyperlipidemia   . Hypertension   . Sleep apnea with use of continuous positive airway pressure (CPAP)     Tobacco History: Social History   Tobacco Use  Smoking Status Former Smoker  . Packs/day: 2.00  . Years: 30.00  . Pack years: 60.00  . Types: Cigarettes  . Quit date: 05/01/2019  . Years since quitting: 1.0  Smokeless Tobacco Never Used  Tobacco Comment   smokes 2-3cigs per day   Counseling given: Not Answered Comment: smokes 2-3cigs per day   Outpatient Medications Prior to Visit  Medication Sig Dispense Refill  . acetaminophen (TYLENOL) 325 MG tablet Take 650 mg by mouth every 6 (six) hours as needed for mild pain or headache.    . albuterol (PROVENTIL HFA;VENTOLIN HFA) 108 (90 Base) MCG/ACT inhaler Inhale 1-2  puffs into the lungs every 4 (four) hours as needed for wheezing or shortness of breath.     [name] albuterol (PROVENTIL) (2.5 MG/3ML) 0.083% nebulizer solution INHALE 1 VIAL VIA NEBULIZER 5 TIMES DAILY AS NEEDED FOR WHEEZING OR FOR SHORTNESS OF BREATH 150 mL 1  . amLODipine (NORVASC) 5 MG tablet Take 1 tablet (5 mg total) by mouth daily. 90 tablet 3  . apixaban (ELIQUIS) 5 MG TABS tablet Take 1 tablet (5 mg total) by mouth 2 (two) times daily. 180 tablet 1  .  aspirin EC 81 MG tablet Take 81 mg by mouth daily.    . Blood Glucose Monitoring Suppl (ONE TOUCH ULTRA 2) w/Device KIT Use to test blood sugars 1-2 times daily. 1 kit 0  . busPIRone (BUSPAR) 15 MG tablet Take 1 tablet (15 mg total) by mouth 2 (two) times daily. 180 tablet 1  . carvedilol (COREG) 12.5 MG tablet Take 1 tablet (12.5 mg total) by mouth 2 (two) times daily with a meal. 180 tablet 3  . diphenhydrAMINE (BENADRYL) 25 MG tablet Take 50 mg by mouth every 6 (six) hours as needed for itching or allergies.     . famotidine (PEPCID) 20 MG tablet TAKE 1 TABLET BY MOUTH  DAILY (Patient taking differently: Take 20 mg by mouth daily. ) 90 tablet 3  . FLUoxetine (PROZAC) 20 MG capsule TAKE 3 CAPSULES BY MOUTH  DAILY (Patient taking differently: Take 60 mg by mouth daily. ) 270 capsule 3  . Fluticasone-Umeclidin-Vilant (TRELEGY ELLIPTA) 100-62.5-25 MCG/INH AEPB Inhale 1 puff into the lungs daily. 60 each 5  . furosemide (LASIX) 20 MG tablet TAKE 1 TABLET BY MOUTH  DAILY (Patient taking differently: Take 20 mg by mouth daily. ) 90 tablet 3  . gabapentin (NEURONTIN) 300 MG capsule Take 1 capsule (300 mg total) by mouth 3 (three) times daily. 90 capsule 3  . glucose blood (ONETOUCH ULTRA) test strip USE TO TEST BLOOD SUGAR 1-2 TIMES DAILY 100 each 11  . ipratropium (ATROVENT) 0.02 % nebulizer solution Take 2.5 mLs (0.5 mg total) by nebulization 4 (four) times daily. 75 mL 12  . isosorbide mononitrate (IMDUR) 30 MG 24 hr tablet Take 1 tablet (30 mg total) by mouth daily. 90 tablet 3  . Lancets (ONETOUCH ULTRASOFT) lancets USE TO TEST BLOOD SUGAR 1-2 TIMES DAILY 100 each 11  . losartan (COZAAR) 50 MG tablet Take 1 tablet (50 mg total) by mouth daily. 90 tablet 3  . lubiprostone ([name]) 8 MCG capsule TAKE 1 CAPSULE(8 MCG) BY MOUTH TWICE DAILY WITH A MEAL 60 capsule 8  . metFORMIN (GLUCOPHAGE) 500 MG tablet TAKE 1 TABLET BY MOUTH  TWICE DAILY WITH MEALS (Patient taking differently: Take 500 mg by mouth 2  (two) times daily with a meal. ) 180 tablet 3  . Multiple Vitamin (MULTIVITAMIN WITH MINERALS) TABS tablet Take 1 tablet by mouth daily.    . nicotine polacrilex (NICORETTE) 4 MG gum Take 4 mg by mouth as needed for smoking cessation.    . nitroGLYCERIN (NITROSTAT) 0.4 MG SL tablet DISSOLVE 1 TABLET UNDER THE TONGUE EVERY 5 MINUTES AS  NEEDED FOR CHEST PAIN. MAX  OF 3 TABLETS IN 15 MINUTES. CALL 911 IF PAIN PERSISTS. (Patient taking differently: Place 0.4 mg under the tongue every 5 (five) minutes as needed for chest pain. ) 75 tablet 4  . pantoprazole (PROTONIX) 40 MG tablet Take 1 tablet (40 mg total) by mouth in the morning. 30 tablet 8  . traZODone (DESYREL)  50 MG tablet TAKE 1/2 TO 1 TABLET BY  MOUTH AT BEDTIME AS NEEDED  FOR SLEEP. (Patient taking differently: Take 50 mg by mouth at bedtime as needed for sleep. ) 30 tablet 11  . doxycycline (VIBRA-TABS) 100 MG tablet Take 1 tablet (100 mg total) by mouth 2 (two) times daily. 14 tablet 0  . predniSONE (DELTASONE) 10 MG tablet 4 tabs for 2 days, then 3 tabs for 2 days, 2 tabs for 2 days, then 1 tab for 2 days, then stop 20 tablet 0   No facility-administered medications prior to visit.     Review of Systems:   Constitutional:   No  weight loss, night sweats,  Fevers, chills, f +atigue, or  lassitude.  HEENT:   No headaches,  Difficulty swallowing,  Tooth/dental problems, or  Sore throat,                No sneezing, itching, ear ache, nasal congestion, post nasal drip,   CV:  No chest pain,  Orthopnea, PND, swelling in lower extremities, anasarca, dizziness, palpitations, syncope.   GI  No heartburn, indigestion, abdominal pain, nausea, vomiting, diarrhea, change in bowel habits, loss of appetite, bloody stools.   Resp:    No chest wall deformity  Skin: no rash or lesions.  GU: no dysuria, change in color of urine, no urgency or frequency.  No flank pain, no hematuria   MS:  No joint pain or swelling.  No decreased range of motion.   No back pain.    Physical Exam  BP (!) 148/62 (BP Location: Left Arm, Cuff Size: Normal)   Pulse 75   Temp 97.7 F (36.5 C) (Temporal)   Ht 6' (1.829 m)   Wt 185 lb 12.8 oz (84.3 kg)   SpO2 98% Comment: RA  BMI 25.20 kg/m   GEN: A/Ox3; pleasant , NAD   HEENT:  [place]/AT,    NOSE-clear, THROAT-clear, no lesions, no postnasal drip or exudate noted.   NECK:  Supple w/ fair ROM; no JVD; normal carotid impulses w/o bruits; no thyromegaly or nodules palpated; no lymphadenopathy.    RESP faint expiratory wheeze.  Speaks in full sentences.   no accessory muscle use, no dullness to percussion  CARD:  RRR, no m/r/g, no  peripheral edema, pulses intact, no cyanosis or clubbing.  GI:   Soft & nt; nml bowel sounds; no organomegaly or masses detected.   Musco: Warm bil, no deformities or joint swelling noted.   Neuro: alert, no focal deficits noted.    Skin: Warm, no lesions or rashes    Lab Results:    ProBNP No results found for: PROBNP  Imaging: DG Chest 2 View  Result Date: 06/01/2020 CLINICAL DATA:  COPD EXAM: CHEST - 2 VIEW COMPARISON:  04/13/2020 FINDINGS: Lungs are hyperinflated as can be seen with COPD. No focal consolidation. No pleural effusion or pneumothorax. Heart and mediastinal contours are unremarkable. No acute osseous abnormality. IMPRESSION: No active cardiopulmonary disease. Electronically Signed   By: Kathreen Devoid   On: 06/01/2020 10:31      PFT Results Latest Ref Rng & Units 05/02/2020  FVC-Pre L 1.90  FVC-Predicted Pre % 45  FVC-Post L 2.06  FVC-Predicted Post % 48  Pre FEV1/FVC % % 43  Post FEV1/FCV % % 42  FEV1-Pre L 0.81  FEV1-Predicted Pre % 24  FEV1-Post L 0.86  DLCO uncorrected ml/min/mmHg 14.61  DLCO UNC% % 50  DLCO corrected ml/min/mmHg 14.61  DLCO COR %Predicted %  50  DLVA Predicted % 72  TLC L 7.93  TLC % Predicted % 110  RV % Predicted % 220    No results found for: NITRICOXIDE      Assessment & Plan:   Chronic obstructive  pulmonary disease (HCC) Slow to resolve COPD exacerbation.  Patient has significant symptom burden and takes prolonged time for recovery after activity. We will extend current antibiotics out for additional 3 days and prednisone taper for another week. Continue on triple therapy with Trelegy inhaler.  Use albuterol nebulizer as needed. On return visit consider referral to pulmonary rehab. Chest x-ray today with no acute process noted  Patient was given an albuterol nebulizer treatment in the office today as he had significant symptom burden and increased work of breathing after walk test.  Patient took 5 minutes to recover but had improved aeration and decreased work of breathing with no accessory muscle use after albuterol nebulizer.  O2 saturations remained 99 100% on room air.  Patient advised if symptoms do not improve or worsen he is to seek emergency room care.  Plan  Patient Instructions  Extend 124m Twice daily  For 3 days , take with food, wear sunscreen.  Mucinex DM Twice daily  As needed  Cough/congestion.  Prednisone taper over next week.  Call if blood sugars are >250.  Continue on TRELEGY 1 puff daily  Albuterol inhaler or neb As needed   Begin Oxygen 2l/m At bedtime with CPAP  Continue on CPAP At bedtime  .  Follow up with Dr. BLamonte Sakaior Sharaine Delange NP in 6 weeks and As needed  Please contact office for sooner follow up if symptoms do not improve or worsen or seek emergency care       Chronic combined systolic and diastolic heart failure ([place] Appears euvolemic on exam.  No evidence of volume overload.  Continue with follow-up with cardiology and current regimen  Chronic respiratory failure with hypoxia (HCC) Nocturnal hypoxemia on CPAP.  CPAP titration study 2020 showed optimal control on CPAP however persistent hypoxemia.  Recent overnight oximetry test on CPAP showed positive desaturations.  Patient will require oxygen at 2 L with his CPAP at bedtime.  Oxygen order was sent  to homecare company  Obstructive sleep apnea Continue on nocturnal CPAP.  We will add in 2 L of oxygen with his CPAP.  Plan  Patient Instructions  Extend 104mTwice daily  For 3 days , take with food, wear sunscreen.  Mucinex DM Twice daily  As needed  Cough/congestion.  Prednisone taper over next week.  Call if blood sugars are >250.  Continue on TRELEGY 1 puff daily  Albuterol inhaler or neb As needed   Begin Oxygen 2l/m At bedtime with CPAP  Continue on CPAP At bedtime  .  Follow up with Dr. ByLamonte Sakair Tushar Enns NP in 6 weeks and As needed  Please contact office for sooner follow up if symptoms do not improve or worsen or seek emergency care    '      Jailin Manocchio, NP 06/01/2020

## 2020-06-01 NOTE — Assessment & Plan Note (Signed)
Appears euvolemic on exam.  No evidence of volume overload.  Continue with follow-up with cardiology and current regimen

## 2020-06-01 NOTE — Assessment & Plan Note (Signed)
Nocturnal hypoxemia on CPAP.  CPAP titration study 2020 showed optimal control on CPAP however persistent hypoxemia.  Recent overnight oximetry test on CPAP showed positive desaturations.  Patient will require oxygen at 2 L with his CPAP at bedtime.  Oxygen order was sent to homecare company

## 2020-06-05 ENCOUNTER — Telehealth: Payer: Self-pay | Admitting: Family Medicine

## 2020-06-05 NOTE — Progress Notes (Signed)
  Chronic Care Management   Outreach Note  06/05/2020 Name: Kenneth [name] MRN: [id number] DOB: 1960-10-07  Referred by: Mliss Sax, MD Reason for referral : No chief complaint on file.   An unsuccessful telephone outreach was attempted today. The patient was referred to the pharmacist for assistance with care management and care coordination.   Follow Up Plan:   Carley Perdue [employer] Scheduler

## 2020-06-05 NOTE — Telephone Encounter (Deleted)
Pt called back and I let him know you would call back when you were available.

## 2020-06-05 NOTE — Progress Notes (Signed)
  Chronic Care Management   Note  06/05/2020 Name: Kenneth [name] MRN: [id number] DOB: [birth year]  Kenneth [name] is a 59 y.o. year old male who is a primary care patient of Mliss Sax, MD. I reached out to Gwenith Daily by phone today in response to a referral sent by [name]'s PCP, Mliss Sax, MD.   [name] was given information about Chronic Care Management services today including:  1. CCM service includes personalized support from designated clinical staff supervised by his physician, including individualized plan of care and coordination with other care providers 2. 24/7 contact phone numbers for assistance for urgent and routine care needs. 3. Service will only be billed when office clinical staff spend 20 minutes or more in a month to coordinate care. 4. Only one practitioner may furnish and bill the service in a calendar month. 5. The patient may stop CCM services at any time (effective at the end of the month) by phone call to the office staff.   Patient agreed to services and verbal consent obtained.   Follow up plan:   Carley Perdue [employer] Scheduler

## 2020-06-06 DIAGNOSIS — J449 Chronic obstructive pulmonary disease, unspecified: Secondary | ICD-10-CM | POA: Diagnosis not present

## 2020-06-08 ENCOUNTER — Telehealth: Payer: Self-pay | Admitting: Adult Health

## 2020-06-08 ENCOUNTER — Other Ambulatory Visit: Payer: Self-pay | Admitting: *Deleted

## 2020-06-08 NOTE — Telephone Encounter (Signed)
I called and spoke to [facility] about this order told her it was sent to [facility] I have sent another message to [employer]

## 2020-06-08 NOTE — Patient Outreach (Signed)
[facility]) Care Management  06/08/2020  Kenneth [name] [birth year] [id number]   Call placed to member to follow up on COPD management.  He report he is still having shortness of breath, just completed a breathing treatment prior to call.  Confirms he did have follow up appointment with pulmonology last week, recommended to have oxygen added to CPAP at night.  Per note, order has been placed to DME company.  Member report he has not been contacted concerning the oxygen.  Uses [facility] for his CPAP.  He remains frustrated that he is not able to have the oxygen during the day, verbalizes understanding when requirements are explained.  State his oxygen levels during the day have remained greater then 90% despite being short of breath.  He has been taking his Mucinex and Prednisone taper as instructed.    Call placed to [facility] to follow up on referral for oxygen, they deny receiving an order.  They will need order faxed to [phone].  Call then placed to [facility], was able to speak with Chantel.  This care manager informed that order was placed to [facility] instead of [facility], she has not received response from company.  She will follow up with them regarding order.  Outgoing call placed back to member to provide update, encouraged to contact this care manager with questions.   Will follow up with member within the next month.  Goals Addressed              This Visit's Progress   .  I want to get my breathing better and stay out of the hospital (pt-stated)   On track     CARE PLAN ENTRY (see longtitudinal plan of care for additional care plan information)  Current Barriers:  [name] Knowledge deficits related to basic understanding of COPD disease process . Knowledge deficits related to basic COPD self care/management   Case Manager Clinical Goal(s):  Over the next 28 days, patient will be able to verbalize understanding of COPD action plan and when to seek  appropriate levels of medical care  Over the next 28 days, patient will engage in lite exercise as tolerated to build/regain stamina and strength and reduce shortness of breath through activity tolerance  Over the next 31 days, patient will not be hospitalized for COPD exacerbation   Interventions:   Provided patient with basic written and verbal COPD education on self care/management/and exacerbation prevention   Advised patient to self assesses COPD action plan zone and make appointment with provider if in the yellow zone for 48 hours without improvement.  Provided patient with education about the role of exercise in the management of COPD  Advised patient to engage in light exercise as tolerated 3-5 days a week  Patient Self Care Activities:  . Takes medications as prescribed including inhalers . Self assesses COPD action plan zone and makes appointment with provider if in the yellow zone for 48 hours without improvement. . Utilizes infection prevention strategies to reduce risk of respiratory infection  . Does not contact provider office for questions/concerns  UPDATE:  has remained out of the hospital, continue to work on overall management of COPD and obtaining O2.       Kenneth [name], [place], MSN [facility] Care Management  [facility] Manager [phone]

## 2020-06-11 NOTE — Telephone Encounter (Signed)
I am confused, I thought pt had ONO performed with [facility]. [facility] should have the ONO report. Is pt now wanting to switch DMEs from [facility] to [facility]? I see that there was a DME order placed on 06/01/20.

## 2020-06-11 NOTE — Telephone Encounter (Signed)
Kenneth [name] has sent a message stating they need the ONO can we locate it and bring it to a [facility] to send over to [place]

## 2020-06-11 NOTE — Telephone Encounter (Signed)
I have sent CM to [facility] yes since the ONO was done by [facility] the 02 order should have been sent to [facility] unless the patient wanted to change DMEs

## 2020-06-11 NOTE — Telephone Encounter (Signed)
I have called Kenneth [name] waiting on a call back

## 2020-06-12 ENCOUNTER — Telehealth: Payer: Self-pay | Admitting: Adult Health

## 2020-06-12 NOTE — Telephone Encounter (Signed)
Patient has already had order placed for 2 liters of oxygen via his CPAP placed. Nothing further needed. ONO results sent to scan.

## 2020-06-13 ENCOUNTER — Telehealth: Payer: Self-pay | Admitting: Adult Health

## 2020-06-13 NOTE — Addendum Note (Signed)
Addended by: Lake Bells on: 06/13/2020 04:50 PM   Modules accepted: Orders

## 2020-06-13 NOTE — Telephone Encounter (Signed)
ATC patient, left VM letting patient know that his oxygen should be delivered by [facility] today.  Advised to call the office with any questions.

## 2020-06-13 NOTE — Telephone Encounter (Signed)
Melissa With [employer] calling to sate that they will be delivering the pt's oxygen today. Melissa can be reached at [phone].

## 2020-06-13 NOTE — Telephone Encounter (Signed)
Kenneth [name], please advise if you have any update for [place] from [employer].

## 2020-06-13 NOTE — Telephone Encounter (Signed)
Spoke with patient regarding prior message. Patient stated his [id number]  company is waiting for a conformation  Regarding prior order that was sent in. Synetta Fail can you check into this please . Thank you

## 2020-06-13 NOTE — Telephone Encounter (Signed)
Rec'd confirmation from [facility] that she has this order  Nestor Ramp, Leafy Ro, [place]; Caroleen Hamman; 1 other  received, thanks

## 2020-06-14 ENCOUNTER — Telehealth: Payer: Self-pay

## 2020-06-14 NOTE — Progress Notes (Signed)
Chronic Care Management Pharmacy Assistant   Name: Mahamadou Weltz  MRN: [id number] DOB: January 05, 1961  Reason for Encounter: Medication Review/initial questions for initial visit with clinical Pharmacist.   PCP : [name], MD  Allergies:   Allergies  Allergen Reactions  . Lisinopril Swelling  . Other Other (See Comments)    Lettuce : rash  . Tomato Rash    Medications: Outpatient Encounter Medications as of 06/14/2020  Medication Sig Note  . acetaminophen (TYLENOL) 325 MG tablet Take 650 mg by mouth every 6 (six) hours as needed for mild pain or headache.   . albuterol (PROVENTIL HFA;VENTOLIN HFA) 108 (90 Base) MCG/ACT inhaler Inhale 1-2 puffs into the lungs every 4 (four) hours as needed for wheezing or shortness of breath.    [name] albuterol (PROVENTIL) (2.5 MG/3ML) 0.083% nebulizer solution INHALE 1 VIAL VIA NEBULIZER 5 TIMES DAILY AS NEEDED FOR WHEEZING OR FOR SHORTNESS OF BREATH   . amLODipine (NORVASC) 5 MG tablet Take 1 tablet (5 mg total) by mouth daily.   [name] apixaban (ELIQUIS) 5 MG TABS tablet Take 1 tablet (5 mg total) by mouth 2 (two) times daily.   [name] aspirin EC 81 MG tablet Take 81 mg by mouth daily.   . Blood Glucose Monitoring Suppl (ONE TOUCH ULTRA 2) w/Device KIT Use to test blood sugars 1-2 times daily.   . busPIRone (BUSPAR) 15 MG tablet Take 1 tablet (15 mg total) by mouth 2 (two) times daily.   . carvedilol (COREG) 12.5 MG tablet Take 1 tablet (12.5 mg total) by mouth 2 (two) times daily with a meal.   . diphenhydrAMINE (BENADRYL) 25 MG tablet Take 50 mg by mouth every 6 (six) hours as needed for itching or allergies.  04/24/2020: PRN  . doxycycline (VIBRA-TABS) 100 MG tablet Take 1 tablet (100 mg total) by mouth 2 (two) times daily.   . famotidine (PEPCID) 20 MG tablet TAKE 1 TABLET BY MOUTH  DAILY (Patient taking differently: Take 20 mg by mouth daily. )   . FLUoxetine (PROZAC) 20 MG capsule TAKE 3 CAPSULES BY MOUTH  DAILY (Patient taking differently: Take  60 mg by mouth daily. )   . Fluticasone-Umeclidin-Vilant (TRELEGY ELLIPTA) 100-62.5-25 MCG/INH AEPB Inhale 1 puff into the lungs daily.   . furosemide (LASIX) 20 MG tablet TAKE 1 TABLET BY MOUTH  DAILY (Patient taking differently: Take 20 mg by mouth daily. )   . gabapentin (NEURONTIN) 300 MG capsule Take 1 capsule (300 mg total) by mouth 3 (three) times daily.   [name] glucose blood (ONETOUCH ULTRA) test strip USE TO TEST BLOOD SUGAR 1-2 TIMES DAILY   . ipratropium (ATROVENT) 0.02 % nebulizer solution Take 2.5 mLs (0.5 mg total) by nebulization 4 (four) times daily.   . isosorbide mononitrate (IMDUR) 30 MG 24 hr tablet Take 1 tablet (30 mg total) by mouth daily.   . Lancets (ONETOUCH ULTRASOFT) lancets USE TO TEST BLOOD SUGAR 1-2 TIMES DAILY   . losartan (COZAAR) 50 MG tablet Take 1 tablet (50 mg total) by mouth daily.   [name] lubiprostone ([name]) 8 MCG capsule TAKE 1 CAPSULE(8 MCG) BY MOUTH TWICE DAILY WITH A MEAL   . metFORMIN (GLUCOPHAGE) 500 MG tablet TAKE 1 TABLET BY MOUTH  TWICE DAILY WITH MEALS (Patient taking differently: Take 500 mg by mouth 2 (two) times daily with a meal. )   . Multiple Vitamin (MULTIVITAMIN WITH MINERALS) TABS tablet Take 1 tablet by mouth daily.   . nicotine polacrilex (NICORETTE) 4  MG gum Take 4 mg by mouth as needed for smoking cessation.   . nitroGLYCERIN (NITROSTAT) 0.4 MG SL tablet DISSOLVE 1 TABLET UNDER THE TONGUE EVERY 5 MINUTES AS  NEEDED FOR CHEST PAIN. MAX  OF 3 TABLETS IN 15 MINUTES. CALL 911 IF PAIN PERSISTS. (Patient taking differently: Place 0.4 mg under the tongue every 5 (five) minutes as needed for chest pain. ) 04/24/2020: PRN  . pantoprazole (PROTONIX) 40 MG tablet Take 1 tablet (40 mg total) by mouth in the morning.   . predniSONE (DELTASONE) 10 MG tablet 4 tabs for 2 days, then 3 tabs for 2 days, 2 tabs for 2 days, then 1 tab for 2 days, then stop   . traZODone (DESYREL) 50 MG tablet TAKE 1/2 TO 1 TABLET BY  MOUTH AT BEDTIME AS NEEDED  FOR SLEEP. (Patient  taking differently: Take 50 mg by mouth at bedtime as needed for sleep. )    No facility-administered encounter medications on file as of 06/14/2020.    Current Diagnosis: Patient Active Problem List   Diagnosis Date Noted  . Chronic respiratory failure with hypoxia ([place]) 06/01/2020  . Obstructive sleep apnea 05/02/2020  . Type 2 diabetes mellitus with diabetic neuropathy, without long-term current use of insulin ([place]) 04/24/2020  . Hyperlipidemia   . Acute on chronic combined systolic and diastolic CHF (congestive heart failure) ([place]) 04/07/2020  . Acute respiratory failure with hypoxia ([place]) 04/06/2020  . Acute on chronic respiratory failure with hypoxemia ([place]) 02/08/2020  . Chronic constipation 07/19/2019  . Hospital discharge follow-up 06/17/2019  . Shortness of breath 06/12/2019  . Chest tightness 06/12/2019  . Snoring 03/29/2019  . Insomnia due to other mental disorder 11/15/2018  . Anticoagulant long-term use 10/21/2018  . Gastroesophageal reflux disease without esophagitis 08/10/2018  . Tobacco abuse 08/10/2018  . Chest pain 07/27/2018  . PAF (paroxysmal atrial fibrillation) ([place]) 07/27/2018  . Mild renal insufficiency 07/27/2018  . Chronic combined systolic and diastolic heart failure ([place]) 07/27/2018  . Slow transit constipation 07/20/2018  . Essential hypertension 07/06/2018  . Chronic obstructive pulmonary disease ([place]) 07/06/2018  . Controlled type 2 diabetes mellitus without complication, without long-term current use of insulin ([place]) 07/06/2018  . Depression 07/06/2018  . Macrocytic anemia 07/06/2018  . Healthcare maintenance 07/06/2018  . Alcohol abuse 07/06/2018    Follow-Up:  Pharmacist Review   Have you seen any other providers since your last visit? no Any changes in your medications or health? no Any side effects from any medications? no Do you have an symptoms or problems not managed by your medications? no Any concerns about your health right now?  Yes  Patient states he has been having trouble with his breathing.Patient states he did receive a oxygen tank and "that seem to help out a lot". Has your provider asked that you check blood pressure, blood sugar, or follow special diet at home? Yes  Patient states he check his blood pressure and blood sugar once in awhile.  Patient reports his blood sugar drop on 09/22, he did not check to see what his sugar levels was at the time. Patient states after he  drunk a glass of orange juice and ate some peanut butter he felt better.  Patient states he cooks at home- potatoes, peas,greens,baked pork chops,baked chicken and cold cut [place].   Do you get any type of exercise on a regular basis? No  Can you think of a goal you would like to reach for your health?   None  ID Do you have any problems getting your medications? no Is there anything that you would like to discuss during the appointment?   Patient states he would like to discuss his oxygen.  Please bring medications and supplements to appointment  [place] Pharmacist Assistant [phone]

## 2020-06-14 NOTE — Telephone Encounter (Signed)
Called and left detailed message for pt to call our office if his O2 was not delivered or if he has any further questions or concerns. Will sign off.

## 2020-06-15 ENCOUNTER — Ambulatory Visit: Payer: Medicare Other

## 2020-06-15 DIAGNOSIS — I10 Essential (primary) hypertension: Secondary | ICD-10-CM

## 2020-06-15 DIAGNOSIS — E1140 Type 2 diabetes mellitus with diabetic neuropathy, unspecified: Secondary | ICD-10-CM

## 2020-06-15 DIAGNOSIS — G4733 Obstructive sleep apnea (adult) (pediatric): Secondary | ICD-10-CM | POA: Diagnosis not present

## 2020-06-15 NOTE — Chronic Care Management (AMB) (Signed)
Chronic Care Management Pharmacy  Name: Kenneth [name]  MRN: [id number] DOB: 07-22-1961   Chief Complaint/ HPI  Kenneth [name],  59 y.o. , male presents for their Initial CCM visit with the clinical pharmacist via telephone.  PCP : [name], MD Patient Care Team: [name], MD as PCP - General (Family Medicine) Buford Dresser, MD as PCP - Cardiology (Cardiology) Valente David, RN as [place] Management Germaine Pomfret, [name] as Pharmacist (Pharmacist)  Their chronic conditions include: Hypertension, Hyperlipidemia, Diabetes, Atrial Fibrillation, Heart Failure, GERD, COPD, Depression and Tobacco use   Office Visits: 04/24/20: Patient presented to Dr. Ethelene Hal for hospital follow-up. Patient started on Gabapentin for neuropathy. BP in clinic 150/78, patient started on losartan 50 mg daily.  03/22/20: Patient presented to [name], NP for constipation. Patient does not have adequate fiber or water intake. Patient started on Lubiprostone 8 mcg twice daily.   Consult Visit: 06/01/20: Patient presented to Rexene Edison, NP (Pulmonology) for COPD follow-up. Patient still with symptoms of exacerbation. Doxycycline and prednisone course extended.  05/24/20: Patient presented to Rexene Edison, NP (Pulmonology) for COPD follow-up. Patient started on Doxycycline and Prednisone.  05/04/20: Patient presented to Dr. Harrell Gave (Cardiology) for follow-up. Euvolemic, EF improved. Discussed Ferne Coe with patient.  05/02/20: Patient presented to Dr. Lamonte Sakai (Pulmonology) for COPD with acute exacerbation. Patient wondering if CPAP with oxygen would help him.  7/23-7/26/21: Patient hospitalized for COPD exacerbation 7/16-7/18/21: Patient hospitalized for COPD exacerbation   Allergies  Allergen Reactions   Lisinopril Swelling   Other Other (See Comments)    Lettuce : rash   Tomato Rash    Medications: No facility-administered  encounter medications on file as of 06/15/2020.   Outpatient Encounter Medications as of 06/15/2020  Medication Sig Note   acetaminophen (TYLENOL) 325 MG tablet Take 650 mg by mouth every 6 (six) hours as needed for mild pain or headache.    albuterol (PROVENTIL HFA;VENTOLIN HFA) 108 (90 Base) MCG/ACT inhaler Inhale 1-2 puffs into the lungs every 4 (four) hours as needed for wheezing or shortness of breath.     albuterol (PROVENTIL) (2.5 MG/3ML) 0.083% nebulizer solution INHALE 1 VIAL VIA NEBULIZER 5 TIMES DAILY AS NEEDED FOR WHEEZING OR FOR SHORTNESS OF BREATH    amLODipine (NORVASC) 5 MG tablet Take 1 tablet (5 mg total) by mouth daily.    apixaban (ELIQUIS) 5 MG TABS tablet Take 1 tablet (5 mg total) by mouth 2 (two) times daily.    aspirin EC 81 MG tablet Take 81 mg by mouth daily.    busPIRone (BUSPAR) 15 MG tablet Take 1 tablet (15 mg total) by mouth 2 (two) times daily.    carvedilol (COREG) 12.5 MG tablet Take 1 tablet (12.5 mg total) by mouth 2 (two) times daily with a meal.    famotidine (PEPCID) 20 MG tablet TAKE 1 TABLET BY MOUTH  DAILY    FLUoxetine (PROZAC) 20 MG capsule TAKE 3 CAPSULES BY MOUTH  DAILY    Fluticasone-Umeclidin-Vilant (TRELEGY ELLIPTA) 100-62.5-25 MCG/INH AEPB Inhale 1 puff into the lungs daily.    furosemide (LASIX) 20 MG tablet TAKE 1 TABLET BY MOUTH  DAILY    gabapentin (NEURONTIN) 300 MG capsule Take 1 capsule (300 mg total) by mouth 3 (three) times daily.    guaiFENesin (MUCINEX) 600 MG 12 hr tablet Take 600 mg by mouth daily.    ipratropium (ATROVENT) 0.02 % nebulizer solution Take 2.5 mLs (0.5 mg total) by nebulization 4 (four) times  daily.    losartan (COZAAR) 50 MG tablet Take 1 tablet (50 mg total) by mouth daily.    lubiprostone ([name]) 8 MCG capsule TAKE 1 CAPSULE(8 MCG) BY MOUTH TWICE DAILY WITH A MEAL    metFORMIN (GLUCOPHAGE) 500 MG tablet TAKE 1 TABLET BY MOUTH  TWICE DAILY WITH MEALS (Patient taking differently: Take 500 mg by  mouth 2 (two) times daily with a meal. )    Multiple Vitamin (MULTIVITAMIN WITH MINERALS) TABS tablet Take 1 tablet by mouth daily.    nitroGLYCERIN (NITROSTAT) 0.4 MG SL tablet DISSOLVE 1 TABLET UNDER THE TONGUE EVERY 5 MINUTES AS  NEEDED FOR CHEST PAIN. MAX  OF 3 TABLETS IN 15 MINUTES. CALL 911 IF PAIN PERSISTS. 04/24/2020: PRN   pantoprazole (PROTONIX) 40 MG tablet Take 1 tablet (40 mg total) by mouth in the morning.    Blood Glucose Monitoring Suppl (ONE TOUCH ULTRA 2) w/Device KIT Use to test blood sugars 1-2 times daily.    diphenhydrAMINE (BENADRYL) 25 MG tablet Take 50 mg by mouth every 6 (six) hours as needed for itching or allergies.  (Patient not taking: Reported on 06/15/2020) 04/24/2020: PRN   doxycycline (VIBRA-TABS) 100 MG tablet Take 1 tablet (100 mg total) by mouth 2 (two) times daily.    glucose blood (ONETOUCH ULTRA) test strip USE TO TEST BLOOD SUGAR 1-2 TIMES DAILY    isosorbide mononitrate (IMDUR) 30 MG 24 hr tablet Take 1 tablet (30 mg total) by mouth daily.    Lancets (ONETOUCH ULTRASOFT) lancets USE TO TEST BLOOD SUGAR 1-2 TIMES DAILY    nicotine polacrilex (NICORETTE) 4 MG gum Take 4 mg by mouth as needed for smoking cessation. (Patient not taking: Reported on 06/15/2020)    traZODone (DESYREL) 50 MG tablet TAKE 1/2 TO 1 TABLET BY  MOUTH AT BEDTIME AS NEEDED  FOR SLEEP. (Patient not taking: Reported on 06/15/2020)    [DISCONTINUED] predniSONE (DELTASONE) 10 MG tablet 4 tabs for 2 days, then 3 tabs for 2 days, 2 tabs for 2 days, then 1 tab for 2 days, then stop     Wt Readings from Last 3 Encounters:  06/20/20 180 lb 11.2 oz (82 kg)  06/01/20 185 lb 12.8 oz (84.3 kg)  05/24/20 178 lb 3.2 oz (80.8 kg)    Current Diagnosis/Assessment:  SDOH Interventions     Most Recent Value  SDOH Interventions  Financial Strain Interventions Intervention Not Indicated  Transportation Interventions Intervention Not Indicated      Goals Addressed            This Visit's  Progress    Chronic Care Management       CARE PLAN ENTRY (see longitudinal plan of care for additional care plan information)  Current Barriers:   Chronic Disease Management support, education, and care coordination needs related to Hypertension, Hyperlipidemia, Diabetes, Atrial Fibrillation, Heart Failure, GERD, COPD, Depression and Tobacco use    Hypertension BP Readings from Last 3 Encounters:  06/21/20 (!) 173/84  06/17/20 (!) 159/84  06/01/20 (!) 148/62    Pharmacist Clinical Goal(s): o Over the next 90 days, patient will work with PharmD and providers to achieve BP goal <130/80  Current regimen:   Amlodipine 5 mg daily   Carvedilol 12.5 mg twice daily   Furosemide 20 mg daily  Imdur 30 mg daily   Losartan 50 mg daily  Interventions: o Discussed low salt diet and exercising as tolerated extensively o Will initiate blood pressure monitoring plan   Patient self care  activities - Over the next 90 days, patient will: o Check blood pressure 3-5 times weekly, document, and provide at future appointments o Ensure daily salt intake < 2300 mg/day  Hyperlipidemia Lab Results  Component Value Date/Time   LDLCALC 119 (H) 07/06/2018 01:55 PM    Pharmacist Clinical Goal(s): o Over the next 90 days, patient will work with PharmD and providers to achieve LDL goal < 70  Current regimen:  o None  Interventions: o Discussed low cholesterol diet and exercising as tolerated extensively o Will initiate cholesterol monitoring plan  o Recommend starting rosuvastatin 20 mg daily  Diabetes Lab Results  Component Value Date/Time   HGBA1C 6.4 (H) 04/13/2020 05:55 PM   HGBA1C 6.3 (H) 02/08/2020 10:41 PM    Pharmacist Clinical Goal(s): o Over the next 90 days, patient will work with PharmD and providers to achieve A1c goal <7%  Current regimen:  o Metformin 500 mg twice daily   Interventions: o Discussed carbohydrate counting and exercising as tolerated  extensively o Will initiate blood sugar monitoring plan   Patient self care activities - Over the next 90 days, patient will: o Check blood sugar once daily, document, and provide at future appointments o Contact provider with any episodes of hypoglycemia  Medication management  Pharmacist Clinical Goal(s): o Over the next 90 days, patient will work with PharmD and providers to maintain optimal medication adherence  Current pharmacy: [facility]   Interventions o Comprehensive medication review performed. o Continue current medication management strategy  Patient self care activities - Over the next 90 days, patient will: o Take medications as prescribed o Report any questions or concerns to PharmD and/or provider(s)       AFIB   Managed by Dr. Harrell Gave  Patient is currently rate controlled.  Patient has failed these meds in past: n/a Patient is currently controlled on the following  medications:   Carvedilol 12.5 mg twice daily   Eliquis 5 mg twice daily   We discussed:  diet and exercise extensively. Patient denies unusual bruising/bleeding with Eliquis.   Plan  Continue current medications  Heart Failure   Managed by Dr. Harrell Gave  Type: Combined Systolic and Diastolic  Last ejection fraction: 45-50% (04/07/20) NYHA Class: II (slight limitation of activity) AHA HF Stage: B (Heart disease present - no symptoms present)  Patient has failed these meds in past: Lisinopril (Angioedema) Patient is currently controlled on the following  medications:   Amlodipine 5 mg daily   Carvedilol 12.5 mg twice daily   Furosemide 20 mg daily  Imdur 30 mg daily   Losartan 50 mg daily  We discussed:   Cardiology avoiding Entresto due to risk of angioedema Weight has been up to 187 lbs, patient snacking more often.   Plan  Recommend starting Jardiance 10 mg daily. Given current COPD exacerbations will defer until later visit.   Hypertension   BP goal  is:  <130/80  Office blood pressures are  BP Readings from Last 3 Encounters:  06/21/20 (!) 173/84  06/17/20 (!) 159/84  06/01/20 (!) 148/62   CMP Latest Ref Rng & Units 06/20/2020 06/17/2020 04/16/2020  Glucose 70 - 99 mg/dL 221(H) 119(H) 177(H)  BUN 6 - 20 mg/dL 13 9 23(H)  Creatinine 0.61 - 1.24 mg/dL 1.36(H) 1.35(H) 1.38(H)  Sodium 135 - 145 mmol/L 137 137 139  Potassium 3.5 - 5.1 mmol/L 3.6 4.7 4.0  Chloride 98 - 111 mmol/L 99 101 93(L)  CO2 22 - 32 mmol/L 30 26 34(H)  Calcium 8.9 - 10.3 mg/dL 8.9 8.9 9.6  Total Protein 6.5 - 8.1 g/dL - 6.5 -  Total Bilirubin 0.3 - 1.2 mg/dL - 0.7 -  Alkaline Phos 38 - 126 U/L - 54 -  AST 15 - 41 U/L - 21 -  ALT 0 - 44 U/L - 17 -   Patient checks BP at home 3-5x per week Patient home BP readings are ranging: 140s/70, has had a few instances of 150 in the past 1-2 weeks.    Patient has failed these meds in the past: n/a Patient is currently uncontrolled on the following medications:   Amlodipine 5 mg daily (AM)   Carvedilol 12.5 mg twice daily   Furosemide 20 mg daily  Imdur 30 mg daily   Losartan 50 mg daily   We discussed diet and exercise extensively. Dry Mouth comes and goes.   Plan  Continue current medications   Diabetes   A1c goal <7%  Recent Relevant Labs: Lab Results  Component Value Date/Time   HGBA1C 6.4 (H) 04/13/2020 05:55 PM   HGBA1C 6.3 (H) 02/08/2020 10:41 PM   GFR 88.57 02/14/2020 08:54 AM   GFR 66.86 05/20/2019 08:25 AM   MICROALBUR <0.7 07/06/2018 01:55 PM    Last diabetic Eye exam: No results found for: HMDIABEYEEXA  Last diabetic Foot exam: No results found for: HMDIABFOOTEX   Checking BG: Daily  Recent FBG Readings: 116, 80 (didn't eat)  Recent pre-meal BG readings: n/a Recent 2hr PP BG readings:  n/a Recent HS BG readings: n/a  Patient has failed these meds in past: n/a Patient is currently controlled on the following medications:  Metformin 500 mg twice daily   We discussed: diet and  exercise extensively  Plan  Continue current medications    Hyperlipidemia   LDL goal < 70  Lipid Panel     Component Value Date/Time   CHOL 226 (H) 07/06/2018 1355   TRIG 190.0 (H) 07/06/2018 1355   HDL 68.40 07/06/2018 1355   LDLCALC 119 (H) 07/06/2018 1355    Hepatic Function Latest Ref Rng & Units 06/17/2020 04/14/2020 04/13/2020  Total Protein 6.5 - 8.1 g/dL 6.5 5.4(L) 6.1(L)  Albumin 3.5 - 5.0 g/dL 3.7 3.0(L) 3.5  AST 15 - 41 U/L 21 18 37  ALT 0 - 44 U/L 17 33 41  Alk Phosphatase 38 - 126 U/L 54 41 50  Total Bilirubin 0.3 - 1.2 mg/dL 0.7 0.4 0.5     The 10-year ASCVD risk score Mikey Bussing DC Jr., et al., 2013) is: 52%   Values used to calculate the score:     Age: 26 years     Sex: Male     Is Non-Hispanic African American: Yes     Diabetic: Yes     Tobacco smoker: Yes     Systolic Blood Pressure: 093 mmHg     Is BP treated: Yes     HDL Cholesterol: 68.4 mg/dL     Total Cholesterol: 226 mg/dL   Patient has failed these meds in past: n/a Patient is currently uncontrolled on the following medications:   Aspirin 81 mg daily   We discussed: Ok to stop aspirin per cardiology. Discussed the importance of statin therapy, patient was amenable to this change.   Plan  Recommend stopping aspirin.  Recommend starting rosuvastatin 20 mg daily.   COPD   Managed by Dr. Lamonte Sakai  Last spirometry score: FEV1 25% (05/02/20)  Gold Grade: Gold 4 (FEV1<40%) Current COPD Classification:  D (high  sx, >/=2 exacerbations/yr)  Eosinophil count:   Lab Results  Component Value Date/Time   EOSPCT 3 06/20/2020 03:00 PM  %                               Eos (Absolute):  Lab Results  Component Value Date/Time   EOSABS 0.2 06/20/2020 03:00 PM    Tobacco Status:  Social History   Tobacco Use  Smoking Status Former Smoker   Packs/day: 2.00   Years: 30.00   Pack years: 60.00   Types: Cigarettes   Quit date: 05/01/2019   Years since quitting: 1.1  Smokeless Tobacco Never  Used  Tobacco Comment   smokes 2-3cigs per day   Patient has failed these meds in past: n/a Patient is currently uncontrolled on the following  medications:   Proventil HFA   Albuterol 0.083% Neb  Atrovent 0.02% Neb  Mucinex 600 mg daily   Trelegy 100-62.5-25 1 puff daily   Using maintenance inhaler regularly? Yes Frequency of rescue inhaler use:  1-2x per week. Uses Nebulizer 3-4 times daily. Once daily.   We discussed:  proper inhaler technique   Still has a lot of phlegm, improved recently.   Plan  Continue current medications  Depression / Anxiety   PHQ9 Score:  PHQ9 SCORE ONLY 04/24/2020 03/13/2020 02/15/2020  PHQ-9 Total Score 0 17 0   GAD7 Score: GAD 7 : Generalized Anxiety Score 05/20/2019 11/15/2018 07/20/2018 07/06/2018  Nervous, Anxious, on Edge 1 1 1 2   Control/stop worrying 1 1 1 1   Worry too much - different things 1 1 1 1   Trouble relaxing 1 2 2 1   Restless 1 1 1 1   Easily annoyed or irritable 1 1 2 2   Afraid - awful might happen 1 0 1 1  Total GAD 7 Score 7 7 9 9    Patient has failed these meds in past: n/a Patient is currently controlled on the following medications:   Buspirone 15 mg twice daily   Fluoxetine 20 mg 3 caps daily  We discussed:  Patient interested in psychiatry referral  Plan  Continue current medications  GERD   Patient reports dysphagia or heartburn. Expresses understanding to avoid triggers such as fatty foods and large meals.  Currently controlled on:  Famotidine 20 mg daily   Pantoprazole 40 mg daily   Good Sense Antacid chewable  Plan   Continue current medication.   Misc / OTC     APAP 325 2 tabs q6hr PRN  Diphenhydramine 25 mg 2 tablet q6hr PRN   Gabapentin 300 mg TID -Neuropathy in Feet (not as irritating)   Lubiprostone 8 mcg twice daily  Multivitamin daily  Nitroglycerin 0.4 mg PRN   Trazodone 50 mg QHS PRN   We discussed:  Has had to use his NG once in the past 2 weeks.    Plan  Continue current medications  Vaccines   Reviewed and discussed patient's vaccination history.    Immunization History  Administered Date(s) Administered   Influenza,inj,Quad PF,6+ Mos 06/25/2018, 05/20/2019   PFIZER SARS-COV-2 Vaccination 12/22/2019, 01/16/2020   Pneumococcal Polysaccharide-23 06/08/2016   Zoster Recombinat (Shingrix) 07/22/2019     Medication Management   Pt uses [facility]/ [facility] for all medications Uses pill box? Yes  We discussed: Discussed benefits of medication synchronization, packaging and delivery as well as enhanced pharmacist oversight with [facility].  Plan  Continue current medication management strategy  Follow up:  2 month phone visit  [place] Pharmacist [facility]  [phone]

## 2020-06-17 ENCOUNTER — Other Ambulatory Visit: Payer: Self-pay

## 2020-06-17 ENCOUNTER — Emergency Department (HOSPITAL_COMMUNITY): Payer: Medicare Other

## 2020-06-17 ENCOUNTER — Emergency Department (HOSPITAL_COMMUNITY)
Admission: EM | Admit: 2020-06-17 | Discharge: 2020-06-17 | Disposition: A | Discharge status: Home / Self Care | Payer: Medicare Other | Attending: Emergency Medicine | Admitting: Emergency Medicine

## 2020-06-17 DIAGNOSIS — E1140 Type 2 diabetes mellitus with diabetic neuropathy, unspecified: Secondary | ICD-10-CM | POA: Diagnosis not present

## 2020-06-17 DIAGNOSIS — Z7984 Long term (current) use of oral hypoglycemic drugs: Secondary | ICD-10-CM | POA: Insufficient documentation

## 2020-06-17 DIAGNOSIS — J441 Chronic obstructive pulmonary disease with (acute) exacerbation: Secondary | ICD-10-CM

## 2020-06-17 DIAGNOSIS — J449 Chronic obstructive pulmonary disease, unspecified: Secondary | ICD-10-CM | POA: Insufficient documentation

## 2020-06-17 DIAGNOSIS — Z87891 Personal history of nicotine dependence: Secondary | ICD-10-CM | POA: Diagnosis not present

## 2020-06-17 DIAGNOSIS — R0602 Shortness of breath: Secondary | ICD-10-CM | POA: Insufficient documentation

## 2020-06-17 DIAGNOSIS — Z743 Need for continuous supervision: Secondary | ICD-10-CM | POA: Diagnosis not present

## 2020-06-17 DIAGNOSIS — E119 Type 2 diabetes mellitus without complications: Secondary | ICD-10-CM | POA: Diagnosis not present

## 2020-06-17 DIAGNOSIS — Z7901 Long term (current) use of anticoagulants: Secondary | ICD-10-CM | POA: Insufficient documentation

## 2020-06-17 DIAGNOSIS — J45909 Unspecified asthma, uncomplicated: Secondary | ICD-10-CM | POA: Insufficient documentation

## 2020-06-17 DIAGNOSIS — I110 Hypertensive heart disease with heart failure: Secondary | ICD-10-CM | POA: Insufficient documentation

## 2020-06-17 DIAGNOSIS — Z20822 Contact with and (suspected) exposure to covid-19: Secondary | ICD-10-CM | POA: Insufficient documentation

## 2020-06-17 DIAGNOSIS — Z7951 Long term (current) use of inhaled steroids: Secondary | ICD-10-CM | POA: Diagnosis not present

## 2020-06-17 DIAGNOSIS — R069 Unspecified abnormalities of breathing: Secondary | ICD-10-CM | POA: Diagnosis not present

## 2020-06-17 DIAGNOSIS — Z7982 Long term (current) use of aspirin: Secondary | ICD-10-CM | POA: Diagnosis not present

## 2020-06-17 DIAGNOSIS — I5043 Acute on chronic combined systolic (congestive) and diastolic (congestive) heart failure: Secondary | ICD-10-CM | POA: Insufficient documentation

## 2020-06-17 DIAGNOSIS — Z79899 Other long term (current) drug therapy: Secondary | ICD-10-CM | POA: Diagnosis not present

## 2020-06-17 DIAGNOSIS — R52 Pain, unspecified: Secondary | ICD-10-CM | POA: Diagnosis not present

## 2020-06-17 DIAGNOSIS — R0689 Other abnormalities of breathing: Secondary | ICD-10-CM | POA: Diagnosis not present

## 2020-06-17 LAB — CBC WITH DIFFERENTIAL/PLATELET
Abs Immature Granulocytes: 0.01 10*3/uL (ref 0.00–0.07)
Basophils Absolute: 0.1 10*3/uL (ref 0.0–0.1)
Basophils Relative: 1 %
Eosinophils Absolute: 0.3 10*3/uL (ref 0.0–0.5)
Eosinophils Relative: 4 %
HCT: 40.1 % (ref 39.0–52.0)
Hemoglobin: 12.3 g/dL — ABNORMAL LOW (ref 13.0–17.0)
Immature Granulocytes: 0 %
Lymphocytes Relative: 14 %
Lymphs Abs: 1.1 10*3/uL (ref 0.7–4.0)
MCH: 29.4 pg (ref 26.0–34.0)
MCHC: 30.7 g/dL (ref 30.0–36.0)
MCV: 95.7 fL (ref 80.0–100.0)
Monocytes Absolute: 1.1 10*3/uL — ABNORMAL HIGH (ref 0.1–1.0)
Monocytes Relative: 14 %
Neutro Abs: 5.2 10*3/uL (ref 1.7–7.7)
Neutrophils Relative %: 67 %
Platelets: 253 10*3/uL (ref 150–400)
RBC: 4.19 MIL/uL — ABNORMAL LOW (ref 4.22–5.81)
RDW: 13.1 % (ref 11.5–15.5)
WBC: 7.7 10*3/uL (ref 4.0–10.5)
nRBC: 0 % (ref 0.0–0.2)

## 2020-06-17 LAB — COMPREHENSIVE METABOLIC PANEL
ALT: 17 U/L (ref 0–44)
AST: 21 U/L (ref 15–41)
Albumin: 3.7 g/dL (ref 3.5–5.0)
Alkaline Phosphatase: 54 U/L (ref 38–126)
Anion gap: 10 (ref 5–15)
BUN: 9 mg/dL (ref 6–20)
CO2: 26 mmol/L (ref 22–32)
Calcium: 8.9 mg/dL (ref 8.9–10.3)
Chloride: 101 mmol/L (ref 98–111)
Creatinine, Ser: 1.35 mg/dL — ABNORMAL HIGH (ref 0.61–1.24)
GFR calc Af Amer: >60 mL/min (ref >60)
GFR calc non Af Amer: 57 mL/min — ABNORMAL LOW (ref >60)
Glucose, Bld: 119 mg/dL — ABNORMAL HIGH (ref 70–99)
Potassium: 4.7 mmol/L (ref 3.5–5.1)
Sodium: 137 mmol/L (ref 135–145)
Total Bilirubin: 0.7 mg/dL (ref 0.3–1.2)
Total Protein: 6.5 g/dL (ref 6.5–8.1)

## 2020-06-17 LAB — TROPONIN I (HIGH SENSITIVITY)
Troponin I (High Sensitivity): 10 ng/L (ref <18)
Troponin I (High Sensitivity): 6 ng/L (ref <18)

## 2020-06-17 LAB — RESPIRATORY PANEL BY RT PCR (FLU A&B, COVID)
Influenza A by PCR: NEGATIVE
Influenza B by PCR: NEGATIVE
SARS Coronavirus 2 by RT PCR: NEGATIVE

## 2020-06-17 LAB — BRAIN NATRIURETIC PEPTIDE: B Natriuretic Peptide: 964.5 pg/mL — ABNORMAL HIGH (ref 0.0–100.0)

## 2020-06-17 MED ORDER — IPRATROPIUM-ALBUTEROL 0.5-2.5 (3) MG/3ML IN SOLN
3.0000 mL | Freq: Once | RESPIRATORY_TRACT | Status: AC
  Administered 2020-06-17: 3 mL via RESPIRATORY_TRACT
  Filled 2020-06-17: qty 3

## 2020-06-17 MED ORDER — PREDNISONE 10 MG PO TABS: 20.0000 mg | ORAL_TABLET | Freq: Two times a day (BID) | ORAL | 0 refills | Status: DC

## 2020-06-17 MED ORDER — ALBUTEROL SULFATE HFA 108 (90 BASE) MCG/ACT IN AERS
6.0000 | INHALATION_SPRAY | Freq: Once | RESPIRATORY_TRACT | Status: AC
  Administered 2020-06-17: 6 via RESPIRATORY_TRACT
  Filled 2020-06-17: qty 6.7

## 2020-06-17 NOTE — ED Triage Notes (Signed)
BIB [facility] w/ c/o of respiratory distress. Pt chronically wears 2 L of O2 at home, but today felt his breathing worsened. Per EMS pt given 125 of Solumedrol, neb treatment with improvement
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
## 2020-06-17 NOTE — Discharge Instructions (Addendum)
Continue medications as previously prescribed.  Return to the emergency department if your symptoms significantly worsen or change. 

## 2020-06-17 NOTE — ED Provider Notes (Signed)
Care of the patient assumed at the change of shift pending completion of nebulizers and reassessment. Patient now reports he is back to baseline, no longer feeling SOB and would like to go home. He is 100% on his normal 2L. He has nebs at home. Rx for Prednisone and outpatient follow up.    Pollyann Savoy, MD 06/17/20 [phone]

## 2020-06-18 ENCOUNTER — Other Ambulatory Visit: Payer: Self-pay | Admitting: *Deleted
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
## 2020-06-20 ENCOUNTER — Other Ambulatory Visit: Payer: Self-pay

## 2020-06-20 ENCOUNTER — Inpatient Hospital Stay (HOSPITAL_COMMUNITY)
Admission: EM | Admit: 2020-06-20 | Discharge: 2020-06-22 | DRG: 190 | Disposition: A | Discharge status: Home / Self Care | Payer: Medicare Other | Attending: Internal Medicine | Admitting: Internal Medicine

## 2020-06-20 ENCOUNTER — Emergency Department (HOSPITAL_COMMUNITY): Payer: Medicare Other

## 2020-06-20 ENCOUNTER — Encounter (HOSPITAL_COMMUNITY): Payer: Self-pay

## 2020-06-20 DIAGNOSIS — G4733 Obstructive sleep apnea (adult) (pediatric): Secondary | ICD-10-CM | POA: Diagnosis present

## 2020-06-20 DIAGNOSIS — Z743 Need for continuous supervision: Secondary | ICD-10-CM | POA: Diagnosis not present

## 2020-06-20 DIAGNOSIS — J9621 Acute and chronic respiratory failure with hypoxia: Secondary | ICD-10-CM | POA: Diagnosis not present

## 2020-06-20 DIAGNOSIS — Z7951 Long term (current) use of inhaled steroids: Secondary | ICD-10-CM

## 2020-06-20 DIAGNOSIS — I480 Paroxysmal atrial fibrillation: Secondary | ICD-10-CM | POA: Diagnosis not present

## 2020-06-20 DIAGNOSIS — Z87891 Personal history of nicotine dependence: Secondary | ICD-10-CM

## 2020-06-20 DIAGNOSIS — Z20822 Contact with and (suspected) exposure to covid-19: Secondary | ICD-10-CM | POA: Diagnosis present

## 2020-06-20 DIAGNOSIS — Z7901 Long term (current) use of anticoagulants: Secondary | ICD-10-CM

## 2020-06-20 DIAGNOSIS — R0602 Shortness of breath: Secondary | ICD-10-CM | POA: Diagnosis not present

## 2020-06-20 DIAGNOSIS — F329 Major depressive disorder, single episode, unspecified: Secondary | ICD-10-CM | POA: Diagnosis present

## 2020-06-20 DIAGNOSIS — Z79899 Other long term (current) drug therapy: Secondary | ICD-10-CM | POA: Diagnosis not present

## 2020-06-20 DIAGNOSIS — Z7984 Long term (current) use of oral hypoglycemic drugs: Secondary | ICD-10-CM | POA: Diagnosis not present

## 2020-06-20 DIAGNOSIS — J9620 Acute and chronic respiratory failure, unspecified whether with hypoxia or hypercapnia: Secondary | ICD-10-CM

## 2020-06-20 DIAGNOSIS — E1159 Type 2 diabetes mellitus with other circulatory complications: Secondary | ICD-10-CM | POA: Diagnosis present

## 2020-06-20 DIAGNOSIS — Z833 Family history of diabetes mellitus: Secondary | ICD-10-CM

## 2020-06-20 DIAGNOSIS — I5023 Acute on chronic systolic (congestive) heart failure: Secondary | ICD-10-CM | POA: Diagnosis present

## 2020-06-20 DIAGNOSIS — Z8249 Family history of ischemic heart disease and other diseases of the circulatory system: Secondary | ICD-10-CM | POA: Diagnosis not present

## 2020-06-20 DIAGNOSIS — I110 Hypertensive heart disease with heart failure: Secondary | ICD-10-CM | POA: Diagnosis not present

## 2020-06-20 DIAGNOSIS — J441 Chronic obstructive pulmonary disease with (acute) exacerbation: Secondary | ICD-10-CM | POA: Diagnosis not present

## 2020-06-20 DIAGNOSIS — E785 Hyperlipidemia, unspecified: Secondary | ICD-10-CM | POA: Diagnosis not present

## 2020-06-20 DIAGNOSIS — E1142 Type 2 diabetes mellitus with diabetic polyneuropathy: Secondary | ICD-10-CM | POA: Diagnosis not present

## 2020-06-20 DIAGNOSIS — F419 Anxiety disorder, unspecified: Secondary | ICD-10-CM | POA: Diagnosis present

## 2020-06-20 DIAGNOSIS — Z841 Family history of disorders of kidney and ureter: Secondary | ICD-10-CM | POA: Diagnosis not present

## 2020-06-20 DIAGNOSIS — Z7982 Long term (current) use of aspirin: Secondary | ICD-10-CM | POA: Diagnosis not present

## 2020-06-20 DIAGNOSIS — I499 Cardiac arrhythmia, unspecified: Secondary | ICD-10-CM | POA: Diagnosis not present

## 2020-06-20 DIAGNOSIS — Z7952 Long term (current) use of systemic steroids: Secondary | ICD-10-CM | POA: Diagnosis not present

## 2020-06-20 DIAGNOSIS — I152 Hypertension secondary to endocrine disorders: Secondary | ICD-10-CM | POA: Diagnosis present

## 2020-06-20 LAB — CBC WITH DIFFERENTIAL/PLATELET
Abs Immature Granulocytes: 0.02 10*3/uL (ref 0.00–0.07)
Basophils Absolute: 0 10*3/uL (ref 0.0–0.1)
Basophils Relative: 1 %
Eosinophils Absolute: 0.2 10*3/uL (ref 0.0–0.5)
Eosinophils Relative: 3 %
HCT: 37.5 % — ABNORMAL LOW (ref 39.0–52.0)
Hemoglobin: 11.5 g/dL — ABNORMAL LOW (ref 13.0–17.0)
Immature Granulocytes: 0 %
Lymphocytes Relative: 14 %
Lymphs Abs: 1 10*3/uL (ref 0.7–4.0)
MCH: 28.8 pg (ref 26.0–34.0)
MCHC: 30.7 g/dL (ref 30.0–36.0)
MCV: 94 fL (ref 80.0–100.0)
Monocytes Absolute: 0.5 10*3/uL (ref 0.1–1.0)
Monocytes Relative: 8 %
Neutro Abs: 5 10*3/uL (ref 1.7–7.7)
Neutrophils Relative %: 74 %
Platelets: 238 10*3/uL (ref 150–400)
RBC: 3.99 MIL/uL — ABNORMAL LOW (ref 4.22–5.81)
RDW: 13.2 % (ref 11.5–15.5)
WBC: 6.7 10*3/uL (ref 4.0–10.5)
nRBC: 0 % (ref 0.0–0.2)

## 2020-06-20 LAB — GLUCOSE, CAPILLARY: Glucose-Capillary: 265 mg/dL — ABNORMAL HIGH (ref 70–99)

## 2020-06-20 LAB — BASIC METABOLIC PANEL
Anion gap: 8 (ref 5–15)
BUN: 13 mg/dL (ref 6–20)
CO2: 30 mmol/L (ref 22–32)
Calcium: 8.9 mg/dL (ref 8.9–10.3)
Chloride: 99 mmol/L (ref 98–111)
Creatinine, Ser: 1.36 mg/dL — ABNORMAL HIGH (ref 0.61–1.24)
GFR calc Af Amer: >60 mL/min (ref >60)
GFR calc non Af Amer: 57 mL/min — ABNORMAL LOW (ref >60)
Glucose, Bld: 221 mg/dL — ABNORMAL HIGH (ref 70–99)
Potassium: 3.6 mmol/L (ref 3.5–5.1)
Sodium: 137 mmol/L (ref 135–145)

## 2020-06-20 LAB — RESPIRATORY PANEL BY RT PCR (FLU A&B, COVID)
Influenza A by PCR: NEGATIVE
Influenza B by PCR: NEGATIVE
SARS Coronavirus 2 by RT PCR: NEGATIVE

## 2020-06-20 LAB — BRAIN NATRIURETIC PEPTIDE: B Natriuretic Peptide: 1026 pg/mL — ABNORMAL HIGH (ref 0.0–100.0)

## 2020-06-20 MED ORDER — ACETAMINOPHEN 325 MG PO TABS
650.0000 mg | ORAL_TABLET | Freq: Four times a day (QID) | ORAL | Status: DC | PRN
  Administered 2020-06-21: 650 mg via ORAL
  Filled 2020-06-20: qty 2

## 2020-06-20 MED ORDER — INSULIN ASPART 100 UNIT/ML ~~LOC~~ SOLN
0.0000 [IU] | Freq: Every day | SUBCUTANEOUS | Status: DC
  Administered 2020-06-20 – 2020-06-21 (×2): 2 [IU] via SUBCUTANEOUS

## 2020-06-20 MED ORDER — HYDRALAZINE HCL 20 MG/ML IJ SOLN: 5.0000 mg | INTRAMUSCULAR | Status: DC | PRN

## 2020-06-20 MED ORDER — INSULIN ASPART 100 UNIT/ML ~~LOC~~ SOLN
0.0000 [IU] | Freq: Three times a day (TID) | SUBCUTANEOUS | Status: DC
  Administered 2020-06-21 (×3): 2 [IU] via SUBCUTANEOUS
  Administered 2020-06-22: 3 [IU] via SUBCUTANEOUS

## 2020-06-20 MED ORDER — ALBUTEROL SULFATE (2.5 MG/3ML) 0.083% IN NEBU: 2.5000 mg | INHALATION_SOLUTION | RESPIRATORY_TRACT | Status: DC | PRN

## 2020-06-20 MED ORDER — IPRATROPIUM-ALBUTEROL 0.5-2.5 (3) MG/3ML IN SOLN
3.0000 mL | Freq: Three times a day (TID) | RESPIRATORY_TRACT | Status: DC
  Administered 2020-06-21 – 2020-06-22 (×4): 3 mL via RESPIRATORY_TRACT
  Filled 2020-06-20 (×4): qty 3

## 2020-06-20 MED ORDER — DM-GUAIFENESIN ER 30-600 MG PO TB12
1.0000 | ORAL_TABLET | Freq: Two times a day (BID) | ORAL | Status: DC
  Administered 2020-06-20 – 2020-06-22 (×4): 1 via ORAL
  Filled 2020-06-20 (×4): qty 1

## 2020-06-20 MED ORDER — METHYLPREDNISOLONE SODIUM SUCC 125 MG IJ SOLR
60.0000 mg | Freq: Three times a day (TID) | INTRAMUSCULAR | Status: DC
  Administered 2020-06-21 – 2020-06-22 (×5): 60 mg via INTRAVENOUS
  Filled 2020-06-20 (×5): qty 2

## 2020-06-20 MED ORDER — METHYLPREDNISOLONE SODIUM SUCC 125 MG IJ SOLR: 60.0000 mg | Freq: Four times a day (QID) | INTRAMUSCULAR | Status: DC

## 2020-06-20 MED ORDER — ALBUTEROL SULFATE HFA 108 (90 BASE) MCG/ACT IN AERS
4.0000 | INHALATION_SPRAY | Freq: Once | RESPIRATORY_TRACT | Status: AC
  Administered 2020-06-20: 4 via RESPIRATORY_TRACT
  Filled 2020-06-20: qty 6.7

## 2020-06-20 MED ORDER — IPRATROPIUM BROMIDE HFA 17 MCG/ACT IN AERS
2.0000 | INHALATION_SPRAY | Freq: Once | RESPIRATORY_TRACT | Status: AC
  Administered 2020-06-20: 2 via RESPIRATORY_TRACT
  Filled 2020-06-20: qty 12.9

## 2020-06-20 MED ORDER — ALBUTEROL (5 MG/ML) CONTINUOUS INHALATION SOLN
10.0000 mg/h | INHALATION_SOLUTION | RESPIRATORY_TRACT | Status: DC
  Administered 2020-06-20: 10 mg/h via RESPIRATORY_TRACT
  Filled 2020-06-20: qty 20

## 2020-06-20 MED ORDER — METHYLPREDNISOLONE SODIUM SUCC 125 MG IJ SOLR
125.0000 mg | Freq: Once | INTRAMUSCULAR | Status: AC
  Administered 2020-06-20: 125 mg via INTRAVENOUS
  Filled 2020-06-20: qty 2

## 2020-06-20 MED ORDER — IPRATROPIUM-ALBUTEROL 0.5-2.5 (3) MG/3ML IN SOLN
3.0000 mL | Freq: Four times a day (QID) | RESPIRATORY_TRACT | Status: DC
  Administered 2020-06-20: 3 mL via RESPIRATORY_TRACT
  Filled 2020-06-20: qty 3

## 2020-06-20 MED ORDER — ACETAMINOPHEN 650 MG RE SUPP: 650.0000 mg | Freq: Four times a day (QID) | RECTAL | Status: DC | PRN

## 2020-06-20 MED ORDER — BUDESONIDE 0.25 MG/2ML IN SUSP
0.2500 mg | Freq: Two times a day (BID) | RESPIRATORY_TRACT | Status: DC
  Administered 2020-06-20: 0.25 mg via RESPIRATORY_TRACT
  Filled 2020-06-20 (×2): qty 2

## 2020-06-20 NOTE — ED Triage Notes (Signed)
Pt BIB [facility] with SOB, recently seen and dc'd from here for COPD exacerbation. Pt sitting up on side of bed with accessory muscle, increase WOB with CPAP on, Right lung sounds clear, Left side lungs diminished. Pt recently prescribed Atrovent and prednisone.    BP 132/70 RR 20 96% RA 100% NRB   18G LFA

## 2020-06-20 NOTE — ED Provider Notes (Signed)
[facility] Provider Note   CSN: [id number] Arrival date & time: 06/20/20  1433     History Chief Complaint  Patient presents with  . Shortness of Breath    Kenneth [name] is a 59 y.o. male.  Kenneth [name] is a 59 y.o. male with a history of COPD, CHF, chronic respiratory failure on 2 L nasal cannula, hypertension, hyperlipidemia, A. fib, diabetes, sleep apnea, who presents to the ED via EMS for evaluation of shortness of breath.  Patient was seen in the ED a few days ago for shortness of breath and found to have COPD exacerbation after treatment in the ED he was feeling much better and wanted to go home, was taking steroids at home but states that his breathing significantly worsened today.  When EMS arrived patient was noted to be very hypertensive with systolic BP in the 032Z, and they felt that they heard crackles on exam, he was given 3 tablets of nitro and his blood pressure improved but his shortness of breath did not.  He reports worsening shortness of breath with dry nonproductive cough.  No fevers.  He denies any associated chest pain.  He has not had much swelling in his legs.  States this feels similar to his COPD.  With EMS patient was requiring nonrebreather due to work of breathing, but was not hypoxic on his home 2 L nasal cannula.  Patient denies any other aggravating or alleviating factors.        Past Medical History:  Diagnosis Date  . Anxiety   . Arthritis   . Asthma   . Atrial fibrillation ([place])   . CHF (congestive heart failure) ([place])   . COPD (chronic obstructive pulmonary disease) ([place])   . Depression   . Diabetes mellitus without complication ([place])   . Emphysema of lung ([place])   . Heart murmur   . Hyperlipidemia   . Hypertension   . Sleep apnea with use of continuous positive airway pressure (CPAP)     Patient Active Problem List   Diagnosis Date Noted  . Chronic respiratory failure with hypoxia ([place]) 06/01/2020   . Obstructive sleep apnea 05/02/2020  . Type 2 diabetes mellitus with diabetic neuropathy, without long-term current use of insulin ([place]) 04/24/2020  . Hyperlipidemia   . Acute on chronic combined systolic and diastolic CHF (congestive heart failure) ([place]) 04/07/2020  . Acute respiratory failure with hypoxia ([place]) 04/06/2020  . Acute on chronic respiratory failure with hypoxemia ([place]) 02/08/2020  . Chronic constipation 07/19/2019  . Hospital discharge follow-up 06/17/2019  . Shortness of breath 06/12/2019  . Chest tightness 06/12/2019  . Snoring 03/29/2019  . Insomnia due to other mental disorder 11/15/2018  . Anticoagulant long-term use 10/21/2018  . Gastroesophageal reflux disease without esophagitis 08/10/2018  . Tobacco abuse 08/10/2018  . Chest pain 07/27/2018  . PAF (paroxysmal atrial fibrillation) ([place]) 07/27/2018  . Mild renal insufficiency 07/27/2018  . Chronic combined systolic and diastolic heart failure ([place]) 07/27/2018  . Slow transit constipation 07/20/2018  . Essential hypertension 07/06/2018  . Chronic obstructive pulmonary disease ([place]) 07/06/2018  . Controlled type 2 diabetes mellitus without complication, without long-term current use of insulin ([place]) 07/06/2018  . Depression 07/06/2018  . Macrocytic anemia 07/06/2018  . Healthcare maintenance 07/06/2018  . Alcohol abuse 07/06/2018    Past Surgical History:  Procedure Laterality Date  . NO PAST SURGERIES         Family History  Problem Relation Age of Onset  .  Diabetes Mother   . Heart disease Mother   . Diabetes Father   . Heart disease Father   . Diabetes Sister   . Heart disease Sister   . Kidney disease Sister   . Coronary artery disease Brother   . Liver disease Maternal Uncle     Social History   Tobacco Use  . Smoking status: Former Smoker    Packs/day: 2.00    Years: 30.00    Pack years: 60.00    Types: Cigarettes    Quit date: 05/01/2019    Years since quitting: 1.1  . Smokeless  tobacco: Never Used  . Tobacco comment: smokes 2-3cigs per day  Vaping Use  . Vaping Use: Never used  Substance Use Topics  . Alcohol use: Yes    Comment: Drinks up to six beers around a game  . Drug use: Yes    Types: Marijuana    Comment: occ marijuana    Home Medications Prior to Admission medications   Medication Sig Start Date End Date Taking? Authorizing Provider  acetaminophen (TYLENOL) 325 MG tablet Take 650 mg by mouth every 6 (six) hours as needed for mild pain or headache.    [provider]  albuterol (PROVENTIL HFA;VENTOLIN HFA) 108 (90 Base) MCG/ACT inhaler Inhale 1-2 puffs into the lungs every 4 (four) hours as needed for wheezing or shortness of breath.  06/19/16   [provider]  albuterol (PROVENTIL) (2.5 MG/3ML) 0.083% nebulizer solution INHALE 1 VIAL VIA NEBULIZER 5 TIMES DAILY AS NEEDED FOR WHEEZING OR FOR SHORTNESS OF BREATH 05/22/20   [name], MD  amLODipine (NORVASC) 5 MG tablet Take 1 tablet (5 mg total) by mouth daily. 04/23/20   Buford Dresser, MD  apixaban (ELIQUIS) 5 MG TABS tablet Take 1 tablet (5 mg total) by mouth 2 (two) times daily. 04/23/20   Buford Dresser, MD  aspirin EC 81 MG tablet Take 81 mg by mouth daily.    [provider]  Blood Glucose Monitoring Suppl (ONE TOUCH ULTRA 2) w/Device KIT Use to test blood sugars 1-2 times daily. 04/02/20   [name], MD  busPIRone (BUSPAR) 15 MG tablet Take 1 tablet (15 mg total) by mouth 2 (two) times daily. 09/06/19   [name], MD  carvedilol (COREG) 12.5 MG tablet Take 1 tablet (12.5 mg total) by mouth 2 (two) times daily with a meal. 04/23/20   Buford Dresser, MD  diphenhydrAMINE (BENADRYL) 25 MG tablet Take 50 mg by mouth every 6 (six) hours as needed for itching or allergies.  Patient not taking: Reported on 06/15/2020    [provider]  doxycycline (VIBRA-TABS) 100 MG tablet Take 1 tablet (100 mg total) by mouth 2  (two) times daily. 06/01/20   Parrett, Fonnie Mu, NP  famotidine (PEPCID) 20 MG tablet TAKE 1 TABLET BY MOUTH  DAILY 03/23/20   [name], MD  FLUoxetine (PROZAC) 20 MG capsule TAKE 3 CAPSULES BY MOUTH  DAILY 05/26/19   [name], MD  Fluticasone-Umeclidin-Vilant (TRELEGY ELLIPTA) 100-62.5-25 MCG/INH AEPB Inhale 1 puff into the lungs daily. 04/02/20   Collene Gobble, MD  furosemide (LASIX) 20 MG tablet TAKE 1 TABLET BY MOUTH  DAILY 04/02/20   [name], MD  gabapentin (NEURONTIN) 300 MG capsule Take 1 capsule (300 mg total) by mouth 3 (three) times daily. 04/24/20   [name], MD  glucose blood [facility] ULTRA) test strip USE TO TEST BLOOD SUGAR 1-2 TIMES DAILY 05/22/20  [name], MD  guaiFENesin (MUCINEX) 600 MG 12 hr tablet Take 600 mg by mouth daily.    [provider]  ipratropium (ATROVENT) 0.02 % nebulizer solution Take 2.5 mLs (0.5 mg total) by nebulization 4 (four) times daily. 08/05/19   Law, Bea Graff, PA-C  isosorbide mononitrate (IMDUR) 30 MG 24 hr tablet Take 1 tablet (30 mg total) by mouth daily. 04/23/20   Buford Dresser, MD  Lancets [facility] ULTRASOFT) lancets USE TO TEST BLOOD SUGAR 1-2 TIMES DAILY 05/22/20   [name], MD  losartan (COZAAR) 50 MG tablet Take 1 tablet (50 mg total) by mouth daily. 04/24/20   [name], MD  lubiprostone ([name]) 8 MCG capsule TAKE 1 CAPSULE(8 MCG) BY MOUTH TWICE DAILY WITH A MEAL 04/30/20   Esterwood, Amy S, PA-C  metFORMIN (GLUCOPHAGE) 500 MG tablet TAKE 1 TABLET BY MOUTH  TWICE DAILY WITH MEALS Patient taking differently: Take 500 mg by mouth 2 (two) times daily with a meal.  04/02/20   [name], MD  Multiple Vitamin (MULTIVITAMIN WITH MINERALS) TABS tablet Take 1 tablet by mouth daily.    [provider]  nicotine polacrilex (NICORETTE) 4 MG gum Take 4 mg by mouth as needed for smoking cessation. Patient not taking: Reported on  06/15/2020    [provider]  nitroGLYCERIN (NITROSTAT) 0.4 MG SL tablet DISSOLVE 1 TABLET UNDER THE TONGUE EVERY 5 MINUTES AS  NEEDED FOR CHEST PAIN. MAX  OF 3 TABLETS IN 15 MINUTES. CALL 911 IF PAIN PERSISTS. 06/29/19   [name], MD  pantoprazole (PROTONIX) 40 MG tablet Take 1 tablet (40 mg total) by mouth in the morning. 04/30/20   Esterwood, Amy S, PA-C  predniSONE (DELTASONE) 10 MG tablet Take 2 tablets (20 mg total) by mouth 2 (two) times daily with a meal. 06/17/20   Veryl Speak, MD  traZODone (DESYREL) 50 MG tablet TAKE 1/2 TO 1 TABLET BY  MOUTH AT BEDTIME AS NEEDED  FOR SLEEP. Patient not taking: Reported on 06/15/2020 05/26/19   [name], MD    Allergies    Lisinopril, Other, and Tomato  Review of Systems   Review of Systems  Constitutional: Negative for chills and fever.  HENT: Negative.   Respiratory: Positive for cough, shortness of breath and wheezing.   Cardiovascular: Negative for chest pain.  Gastrointestinal: Negative for abdominal pain, nausea and vomiting.  Musculoskeletal: Negative for myalgias.  Neurological: Negative for syncope, light-headedness and headaches.  All other systems reviewed and are negative.   Physical Exam Updated Vital Signs BP (!) 146/72   Pulse 62   Temp (!) 97.3 F (36.3 C) (Axillary)   Resp 16   Ht 6' (1.829 m)   Wt 83 kg   SpO2 100%   BMI 24.82 kg/m   Physical Exam Vitals and nursing note reviewed.  Constitutional:      General: He is not in acute distress.    Appearance: He is well-developed. He is obese. He is ill-appearing. He is not diaphoretic.     Comments: Ill-appearing but in no acute distress  HENT:     Head: Normocephalic and atraumatic.     Mouth/Throat:     Mouth: Mucous membranes are moist.     Pharynx: Oropharynx is clear.  Eyes:     General:        Right eye: No discharge.        Left eye: No discharge.     Pupils: Pupils are  equal, round, and reactive to light.   Cardiovascular:     Rate and Rhythm: Normal rate and regular rhythm.     Heart sounds: Normal heart sounds. No murmur heard.  No friction rub. No gallop.   Pulmonary:     Effort: Tachypnea and accessory muscle usage present. No respiratory distress.     Breath sounds: Decreased breath sounds and wheezing present. No rhonchi or rales.  Chest:     Chest wall: No tenderness.  Abdominal:     General: Bowel sounds are normal. There is no distension.     Palpations: Abdomen is soft. There is no mass.     Tenderness: There is no abdominal tenderness. There is no guarding.     Comments: Abdomen soft, nondistended, nontender to palpation in all quadrants without guarding or peritoneal signs  Musculoskeletal:        General: No deformity.     Cervical back: Neck supple.     Right lower leg: No edema.     Left lower leg: No edema.  Skin:    General: Skin is warm and dry.     Capillary Refill: Capillary refill takes less than 2 seconds.  Neurological:     Mental Status: He is alert.     Coordination: Coordination normal.     Comments: Speech is clear, able to follow commands Moves extremities without ataxia, coordination intact  Psychiatric:        Mood and Affect: Mood normal.        Behavior: Behavior normal.     ED Results / Procedures / Treatments   Labs (all labs ordered are listed, but only abnormal results are displayed) Labs Reviewed  BASIC METABOLIC PANEL - Abnormal; Notable for the following components:      Result Value   Glucose, Bld 221 (*)    Creatinine, Ser 1.36 (*)    GFR calc non Af Amer 57 (*)    All other components within normal limits  CBC WITH DIFFERENTIAL/PLATELET - Abnormal; Notable for the following components:   RBC 3.99 (*)    Hemoglobin 11.5 (*)    HCT 37.5 (*)    All other components within normal limits  BRAIN NATRIURETIC PEPTIDE - Abnormal; Notable for the following components:   B Natriuretic Peptide 1,026.0 (*)    All other components within  normal limits  RESPIRATORY PANEL BY RT PCR (FLU A&B, COVID)  HEMOGLOBIN A1C    EKG EKG Interpretation  Date/Time:  Wednesday June 20 2020 14:43:59 EDT Ventricular Rate:  58 PR Interval:    QRS Duration: 84 QT Interval:  444 QTC Calculation: 437 R Axis:   70 Text Interpretation: Sinus rhythm Atrial premature complex Short PR interval Probable left atrial enlargement LVH with secondary repolarization abnormality No significant change since last tracing Confirmed by Deno Etienne [phone]) on 06/20/2020 3:28:13 PM   Radiology DG Chest Port 1 View  Result Date: 06/20/2020 CLINICAL DATA:  Shortness of breath EXAM: PORTABLE CHEST 1 VIEW. The right costophrenic angle is collimated off view. COMPARISON:  Chest x-ray 06/17/2020, CT chest 03/03/2000 FINDINGS: The heart size and mediastinal contours are unchanged. Aortic arch calcifications. In the Right apical bladder of/bulla better appreciated on CT 03/03/2020. No focal consolidation. No pulmonary edema. No pleural effusion. No pneumothorax. No acute osseous abnormality. IMPRESSION: No active disease. Electronically Signed   By: Iven Finn M.D.   On: 06/20/2020 15:02    Procedures .Critical Care Performed by: Jacqlyn Larsen, PA-C Authorized by: Marijean Bravo,  Audery Amel, PA-C   Critical care provider statement:    Critical care time (minutes):  45   Critical care was necessary to treat or prevent imminent or life-threatening deterioration of the following conditions:  Respiratory failure   Critical care was time spent personally by me on the following activities:  Discussions with consultants, evaluation of patient's response to treatment, examination of patient, ordering and performing treatments and interventions, ordering and review of laboratory studies, ordering and review of radiographic studies, pulse oximetry, re-evaluation of patient's condition, obtaining history from patient or surrogate and review of old charts   (including critical  care time)  Medications Ordered in ED Medications  acetaminophen (TYLENOL) tablet 650 mg (has no administration in time range)    Or  acetaminophen (TYLENOL) suppository 650 mg (has no administration in time range)  ipratropium-albuterol (DUONEB) 0.5-2.5 (3) MG/3ML nebulizer solution 3 mL (has no administration in time range)  albuterol (PROVENTIL) (2.5 MG/3ML) 0.083% nebulizer solution 2.5 mg (has no administration in time range)  budesonide (PULMICORT) nebulizer solution 0.25 mg (has no administration in time range)  insulin aspart (novoLOG) injection 0-9 Units (has no administration in time range)  insulin aspart (novoLOG) injection 0-5 Units (has no administration in time range)  hydrALAZINE (APRESOLINE) injection 5 mg (has no administration in time range)  dextromethorphan-guaiFENesin (MUCINEX DM) 30-600 MG per 12 hr tablet 1 tablet (has no administration in time range)  methylPREDNISolone sodium succinate (SOLU-MEDROL) 125 mg/2 mL injection 60 mg (has no administration in time range)  methylPREDNISolone sodium succinate (SOLU-MEDROL) 125 mg/2 mL injection 125 mg (125 mg Intravenous Given 06/20/20 1457)  albuterol (VENTOLIN HFA) 108 (90 Base) MCG/ACT inhaler 4 puff (4 puffs Inhalation Given 06/20/20 1500)  ipratropium (ATROVENT HFA) inhaler 2 puff (2 puffs Inhalation Given 06/20/20 1500)    ED Course  I have reviewed the triage vital signs and the nursing notes.  Pertinent labs & imaging results that were available during my care of the patient were reviewed by me and considered in my medical decision making (see chart for details).    MDM Rules/Calculators/A&P                         59 year old male with COPD and CHF on chronic 2 L nasal cannula presents with worsening shortness of breath via EMS.  EMS initially felt that this was related to CHF, patient was very hypertensive and they felt that they heard some crackles he was given 3 tablets of nitro in route, but when he arrived  blood pressure was improved but he was still very short of breath on nonrebreather.  He is tachypneic with some accessory muscle use, on exam initially patient was very diminished with very little air movement and some faint wheezing noted.  Patient given albuterol and ipratropium and IV Solu-Medrol with some improvement but still with diminished air movement.  His chest x-ray is clear and does not show signs of pulmonary edema EKG shows sinus rhythm with PACs, and LVH, no significant change from previous.  Will get basic labs and BNP as well as Covid swab.  Patient without leukocytosis, hemoglobin is at baseline, creatinine at baseline at 1.36, glucose of 221 but no other significant electrolyte derangements, BNP at 1026, baseline appears to be around 900, and patient does not appear fluid overloaded on exam, again chest x-ray without evidence of pulmonary edema.  Covid swab is negative.  Given that patient still sounds very diminished will order 1  hour continuous nebulizer treatment.  After CAT patient with improved air movement but diffuse wheezing bilaterally, still mildly tachypneic, on his home 2 L nasal cannula but states he still does not feel at baseline with his breathing and feels like he needs admission.  Patient was here a few days ago and was feeling better and went home, and he thinks he may have left the hospital too soon.  Will consult for admission for COPD exacerbation.  Case discussed with Dr. Marlowe Sax with [facility] hospitalist who will see and admit the patient.  Final Clinical Impression(s) / ED Diagnoses Final diagnoses:  COPD exacerbation [facility])    Rx / DC Orders ED Discharge Orders    None       Janet Berlin 06/20/20 2045    Quintella Reichert, MD 06/21/20 1341

## 2020-06-20 NOTE — ED Notes (Signed)
Attempted report x1. 

## 2020-06-20 NOTE — H&P (Signed)
History and Physical    Kenneth [name] GYF:[id number] DOB: [birth year] DOA: 06/20/2020  PCP: [name], MD Patient coming from: Home  Chief Complaint: Shortness of breath  HPI: Kenneth [name] is a 59 y.o. male with medical history significant of COPD, chronic systolic CHF, paroxysmal atrial fibrillation on Eliquis, anxiety, depression, non-insulin-dependent type II diabetes, hypertension, hyperlipidemia, OSA on CPAP presenting to the ED via EMS for evaluation of shortness of breath.  Upon EMS arrival, patient was sitting up at the side of the bed with accessory muscle use, increased work of breathing and was placed on CPAP.  He had diminished breath sounds and was given Atrovent and prednisone.  Of note, he was seen in the ED 3 days ago for COPD exacerbation.  Patient states he started having difficulty breathing a few days ago when he inhaled smoke from fried food.  States he was seen in the emergency room and prescribed prednisone which he started taking yesterday.  Patient states after his emergency room visit, he was seen by his pulmonologist and started on 2 L home oxygen.  He was started on a new inhaler.  He has continued to feel short of breath despite using home oxygen and his home inhalers.  He also takes Lasix at home.  He is wheezing and having a dry cough.  Denies fevers or chest pain.  He has been vaccinated against Covid.  No other complaints.  ED Course: Afebrile.  Initially required nonrebreather, later transitioned to 2 L home oxygen after he received treatment including ipratropium, albuterol continuous neb, and Solu-Medrol 125 mg.  SARS-CoV-2 PCR test negative.  Influenza panel negative.  Labs showing no leukocytosis.  Hemoglobin 11.5, close to baseline.  Platelet count normal.  Sodium 137, potassium 3.6, chloride 99, bicarb 30, BUN 13, creatinine 1.3 (at baseline), glucose 221.  BNP 1026, chronically elevated and no significant change from baseline.  Chest x-ray showing no  active disease.  Review of Systems:  All systems reviewed and apart from history of presenting illness, are negative.  Past Medical History:  Diagnosis Date  . Anxiety   . Arthritis   . Asthma   . Atrial fibrillation ([place])   . CHF (congestive heart failure) ([place])   . COPD (chronic obstructive pulmonary disease) ([place])   . Depression   . Diabetes mellitus without complication ([place])   . Emphysema of lung ([place])   . Heart murmur   . Hyperlipidemia   . Hypertension   . Sleep apnea with use of continuous positive airway pressure (CPAP)     Past Surgical History:  Procedure Laterality Date  . NO PAST SURGERIES       reports that he quit smoking about 13 months ago. His smoking use included cigarettes. He has a 60.00 pack-year smoking history. He has never used smokeless tobacco. He reports current alcohol use. He reports current drug use. Drug: Marijuana.  Allergies  Allergen Reactions  . Lisinopril Swelling  . Other Other (See Comments)    Lettuce : rash  . Tomato Rash    Family History  Problem Relation Age of Onset  . Diabetes Mother   . Heart disease Mother   . Diabetes Father   . Heart disease Father   . Diabetes Sister   . Heart disease Sister   . Kidney disease Sister   . Coronary artery disease Brother   . Liver disease Maternal Uncle     Prior to Admission medications   Medication Sig Start Date End Date  Taking? Authorizing Provider  acetaminophen (TYLENOL) 325 MG tablet Take 650 mg by mouth every 6 (six) hours as needed for mild pain or headache.    [provider]  albuterol (PROVENTIL HFA;VENTOLIN HFA) 108 (90 Base) MCG/ACT inhaler Inhale 1-2 puffs into the lungs every 4 (four) hours as needed for wheezing or shortness of breath.  06/19/16   [provider]  albuterol (PROVENTIL) (2.5 MG/3ML) 0.083% nebulizer solution INHALE 1 VIAL VIA NEBULIZER 5 TIMES DAILY AS NEEDED FOR WHEEZING OR FOR SHORTNESS OF BREATH 05/22/20   [name],  MD  amLODipine (NORVASC) 5 MG tablet Take 1 tablet (5 mg total) by mouth daily. 04/23/20   Buford Dresser, MD  apixaban (ELIQUIS) 5 MG TABS tablet Take 1 tablet (5 mg total) by mouth 2 (two) times daily. 04/23/20   Buford Dresser, MD  aspirin EC 81 MG tablet Take 81 mg by mouth daily.    [provider]  Blood Glucose Monitoring Suppl (ONE TOUCH ULTRA 2) w/Device KIT Use to test blood sugars 1-2 times daily. 04/02/20   [name], MD  busPIRone (BUSPAR) 15 MG tablet Take 1 tablet (15 mg total) by mouth 2 (two) times daily. 09/06/19   [name], MD  carvedilol (COREG) 12.5 MG tablet Take 1 tablet (12.5 mg total) by mouth 2 (two) times daily with a meal. 04/23/20   Buford Dresser, MD  diphenhydrAMINE (BENADRYL) 25 MG tablet Take 50 mg by mouth every 6 (six) hours as needed for itching or allergies.  Patient not taking: Reported on 06/15/2020    [provider]  doxycycline (VIBRA-TABS) 100 MG tablet Take 1 tablet (100 mg total) by mouth 2 (two) times daily. 06/01/20   Parrett, Fonnie Mu, NP  famotidine (PEPCID) 20 MG tablet TAKE 1 TABLET BY MOUTH  DAILY 03/23/20   [name], MD  FLUoxetine (PROZAC) 20 MG capsule TAKE 3 CAPSULES BY MOUTH  DAILY 05/26/19   [name], MD  Fluticasone-Umeclidin-Vilant (TRELEGY ELLIPTA) 100-62.5-25 MCG/INH AEPB Inhale 1 puff into the lungs daily. 04/02/20   Collene Gobble, MD  furosemide (LASIX) 20 MG tablet TAKE 1 TABLET BY MOUTH  DAILY 04/02/20   [name], MD  gabapentin (NEURONTIN) 300 MG capsule Take 1 capsule (300 mg total) by mouth 3 (three) times daily. 04/24/20   [name], MD  glucose blood [facility] ULTRA) test strip USE TO TEST BLOOD SUGAR 1-2 TIMES DAILY 05/22/20   [name], MD  guaiFENesin (MUCINEX) 600 MG 12 hr tablet Take 600 mg by mouth daily.    [provider]  ipratropium (ATROVENT) 0.02 % nebulizer solution Take 2.5 mLs (0.5 mg  total) by nebulization 4 (four) times daily. 08/05/19   Law, Bea Graff, PA-C  isosorbide mononitrate (IMDUR) 30 MG 24 hr tablet Take 1 tablet (30 mg total) by mouth daily. 04/23/20   Buford Dresser, MD  Lancets [facility] ULTRASOFT) lancets USE TO TEST BLOOD SUGAR 1-2 TIMES DAILY 05/22/20   [name], MD  losartan (COZAAR) 50 MG tablet Take 1 tablet (50 mg total) by mouth daily. 04/24/20   [name], MD  lubiprostone ([name]) 8 MCG capsule TAKE 1 CAPSULE(8 MCG) BY MOUTH TWICE DAILY WITH A MEAL 04/30/20   Esterwood, Amy S, PA-C  metFORMIN (GLUCOPHAGE) 500 MG tablet TAKE 1 TABLET BY MOUTH  TWICE DAILY WITH MEALS Patient taking differently: Take 500 mg by mouth 2 (two) times daily with a meal.  04/02/20  [name], MD  Multiple Vitamin (MULTIVITAMIN WITH MINERALS) TABS tablet Take 1 tablet by mouth daily.    [provider]  nicotine polacrilex (NICORETTE) 4 MG gum Take 4 mg by mouth as needed for smoking cessation. Patient not taking: Reported on 06/15/2020    [provider]  nitroGLYCERIN (NITROSTAT) 0.4 MG SL tablet DISSOLVE 1 TABLET UNDER THE TONGUE EVERY 5 MINUTES AS  NEEDED FOR CHEST PAIN. MAX  OF 3 TABLETS IN 15 MINUTES. CALL 911 IF PAIN PERSISTS. 06/29/19   [name], MD  pantoprazole (PROTONIX) 40 MG tablet Take 1 tablet (40 mg total) by mouth in the morning. 04/30/20   Esterwood, Amy S, PA-C  predniSONE (DELTASONE) 10 MG tablet Take 2 tablets (20 mg total) by mouth 2 (two) times daily with a meal. 06/17/20   Veryl Speak, MD  traZODone (DESYREL) 50 MG tablet TAKE 1/2 TO 1 TABLET BY  MOUTH AT BEDTIME AS NEEDED  FOR SLEEP. Patient not taking: Reported on 06/15/2020 05/26/19   [name], MD    Physical Exam: Vitals:   06/20/20 1635 06/20/20 1830 06/20/20 1900 06/20/20 2000  BP:  (!) 176/74 (!) 153/79 (!) 146/72  Pulse:  62 60 62  Resp:  18 (!) 21 16  Temp:      TempSrc:      SpO2: 100% 99% 100% 100%  Weight:       Height:        Physical Exam Constitutional:      General: He is not in acute distress. HENT:     Head: Normocephalic and atraumatic.  Eyes:     Extraocular Movements: Extraocular movements intact.     Conjunctiva/sclera: Conjunctivae normal.  Cardiovascular:     Rate and Rhythm: Normal rate and regular rhythm.     Pulses: Normal pulses.  Pulmonary:     Effort: Pulmonary effort is normal.     Breath sounds: Wheezing present.     Comments: Slightly diminished breath sounds Mild wheezing Abdominal:     General: Bowel sounds are normal. There is no distension.     Palpations: Abdomen is soft.     Tenderness: There is no abdominal tenderness.  Musculoskeletal:        General: No swelling or tenderness.     Cervical back: Normal range of motion and neck supple.  Skin:    General: Skin is warm and dry.  Neurological:     General: No focal deficit present.     Mental Status: He is alert and oriented to person, place, and time.     Labs on Admission: I have personally reviewed following labs and imaging studies  CBC: Recent Labs  Lab 06/17/20 1157 06/20/20 1500  WBC 7.7 6.7  NEUTROABS 5.2 5.0  HGB 12.3* 11.5*  HCT 40.1 37.5*  MCV 95.7 94.0  PLT 253 403   Basic Metabolic Panel: Recent Labs  Lab 06/17/20 1157 06/20/20 1500  NA 137 137  K 4.7 3.6  CL 101 99  CO2 26 30  GLUCOSE 119* 221*  BUN 9 13  CREATININE 1.35* 1.36*  CALCIUM 8.9 8.9   GFR: Estimated Creatinine Clearance: 65 mL/min (A) (by C-G formula based on SCr of 1.36 mg/dL (H)). Liver Function Tests: Recent Labs  Lab 06/17/20 1157  AST 21  ALT 17  ALKPHOS 54  BILITOT 0.7  PROT 6.5  ALBUMIN 3.7   No results for input(s): LIPASE, AMYLASE in the last 168 hours. No results for input(s):  AMMONIA in the last 168 hours. Coagulation Profile: No results for input(s): INR, PROTIME in the last 168 hours. Cardiac Enzymes: No results for input(s): CKTOTAL, CKMB, CKMBINDEX, TROPONINI in the last  168 hours. BNP (last 3 results) No results for input(s): PROBNP in the last 8760 hours. HbA1C: No results for input(s): HGBA1C in the last 72 hours. CBG: No results for input(s): GLUCAP in the last 168 hours. Lipid Profile: No results for input(s): CHOL, HDL, LDLCALC, TRIG, CHOLHDL, LDLDIRECT in the last 72 hours. Thyroid Function Tests: No results for input(s): TSH, T4TOTAL, FREET4, T3FREE, THYROIDAB in the last 72 hours. Anemia Panel: No results for input(s): VITAMINB12, FOLATE, FERRITIN, TIBC, IRON, RETICCTPCT in the last 72 hours. Urine analysis:    Component Value Date/Time   COLORURINE YELLOW 05/20/2019 0825   APPEARANCEUR CLEAR 05/20/2019 0825   LABSPEC 1.020 05/20/2019 0825   PHURINE 6.0 05/20/2019 0825   GLUCOSEU NEGATIVE 05/20/2019 0825   HGBUR NEGATIVE 05/20/2019 0825   BILIRUBINUR SMALL (A) 05/20/2019 0825   KETONESUR TRACE (A) 05/20/2019 0825   UROBILINOGEN 1.0 05/20/2019 0825   NITRITE NEGATIVE 05/20/2019 0825   LEUKOCYTESUR NEGATIVE 05/20/2019 0825    Radiological Exams on Admission: DG Chest Port 1 View  Result Date: 06/20/2020 CLINICAL DATA:  Shortness of breath EXAM: PORTABLE CHEST 1 VIEW. The right costophrenic angle is collimated off view. COMPARISON:  Chest x-ray 06/17/2020, CT chest 03/03/2000 FINDINGS: The heart size and mediastinal contours are unchanged. Aortic arch calcifications. In the Right apical bladder of/bulla better appreciated on CT 03/03/2020. No focal consolidation. No pulmonary edema. No pleural effusion. No pneumothorax. No acute osseous abnormality. IMPRESSION: No active disease. Electronically Signed   By: Iven Finn M.D.   On: 06/20/2020 15:02    EKG: Independently reviewed.  Sinus rhythm, baseline wander, T wave abnormality in lateral leads similar to prior tracing.  Assessment/Plan Principal Problem:   COPD with acute exacerbation (HCC) Active Problems:   Essential hypertension   PAF (paroxysmal atrial fibrillation) (HCC)    Obstructive sleep apnea   Acute on chronic respiratory failure (HCC)   Acute hypoxemic respiratory failure secondary to acute COPD exacerbation: Now improved after receiving IV Solu-Medrol and bronchodilator treatments in the ED.  Currently satting well on 2 L home oxygen.  Has mildly diminished breath sounds and wheezing on exam.  Not tachypneic. -Continue Solu-Medrol 60 mg every 8 hours, DuoNebs every 6 hours, Pulmicort twice daily, albuterol nebulizer as needed, Mucinex.  Will hold off starting antibiotics given no leukocytosis or signs of infection on chest x-ray.  Continuous pulse ox, supplemental oxygen.  Chronic systolic CHF: BNP chronically elevated and no significant change from baseline.  No signs of volume overload on exam and chest x-ray not suggestive of pulmonary edema. -Resume home Lasix after pharmacy med rec is done.  Paroxysmal atrial fibrillation: Currently in sinus rhythm. -Resume home Eliquis and Coreg after pharmacy med rec is done  Anxiety, depression -Resume home medications after pharmacy med rec is done  Non-insulin-dependent type 2 diabetes -Check A1c.  Sliding scale insulin sensitive ACHS and CBG checks.  Hypertension: Systolic currently in 628Z. -Order hydralazine PRN SBP >170. Resume home medications after pharmacy med rec is done   OSA -Continue CPAP at night  DVT prophylaxis: Resume home Eliquis after pharmacy med rec is done Code Status: Patient wishes to be full code. Family Communication: No family available at this time. Disposition Plan: Status is: Observation  The patient remains OBS appropriate and will d/c before 2 midnights.  Dispo: The patient is from: Home              Anticipated d/c is to: Home              Anticipated d/c date is: 1 day              Patient currently is not medically stable to d/c.  The medical decision making on this patient was of high complexity and the patient is at high risk for clinical deterioration, therefore  this is a level 3 visit.  Shela Leff MD [facility]  If 7PM-7AM, please contact night-coverage [email or web address]  06/20/2020, 8:08 PM

## 2020-06-21 DIAGNOSIS — Z87891 Personal history of nicotine dependence: Secondary | ICD-10-CM | POA: Diagnosis not present

## 2020-06-21 DIAGNOSIS — Z7951 Long term (current) use of inhaled steroids: Secondary | ICD-10-CM | POA: Diagnosis not present

## 2020-06-21 DIAGNOSIS — F329 Major depressive disorder, single episode, unspecified: Secondary | ICD-10-CM | POA: Diagnosis present

## 2020-06-21 DIAGNOSIS — E785 Hyperlipidemia, unspecified: Secondary | ICD-10-CM | POA: Diagnosis present

## 2020-06-21 DIAGNOSIS — Z8249 Family history of ischemic heart disease and other diseases of the circulatory system: Secondary | ICD-10-CM | POA: Diagnosis not present

## 2020-06-21 DIAGNOSIS — Z7952 Long term (current) use of systemic steroids: Secondary | ICD-10-CM | POA: Diagnosis not present

## 2020-06-21 DIAGNOSIS — J9621 Acute and chronic respiratory failure with hypoxia: Secondary | ICD-10-CM | POA: Diagnosis present

## 2020-06-21 DIAGNOSIS — J441 Chronic obstructive pulmonary disease with (acute) exacerbation: Secondary | ICD-10-CM | POA: Diagnosis not present

## 2020-06-21 DIAGNOSIS — Z7982 Long term (current) use of aspirin: Secondary | ICD-10-CM | POA: Diagnosis not present

## 2020-06-21 DIAGNOSIS — I110 Hypertensive heart disease with heart failure: Secondary | ICD-10-CM | POA: Diagnosis present

## 2020-06-21 DIAGNOSIS — Z79899 Other long term (current) drug therapy: Secondary | ICD-10-CM | POA: Diagnosis not present

## 2020-06-21 DIAGNOSIS — Z841 Family history of disorders of kidney and ureter: Secondary | ICD-10-CM | POA: Diagnosis not present

## 2020-06-21 DIAGNOSIS — I5023 Acute on chronic systolic (congestive) heart failure: Secondary | ICD-10-CM | POA: Diagnosis present

## 2020-06-21 DIAGNOSIS — Z7984 Long term (current) use of oral hypoglycemic drugs: Secondary | ICD-10-CM | POA: Diagnosis not present

## 2020-06-21 DIAGNOSIS — Z7901 Long term (current) use of anticoagulants: Secondary | ICD-10-CM | POA: Diagnosis not present

## 2020-06-21 DIAGNOSIS — E1142 Type 2 diabetes mellitus with diabetic polyneuropathy: Secondary | ICD-10-CM | POA: Diagnosis present

## 2020-06-21 DIAGNOSIS — Z20822 Contact with and (suspected) exposure to covid-19: Secondary | ICD-10-CM | POA: Diagnosis present

## 2020-06-21 DIAGNOSIS — G4733 Obstructive sleep apnea (adult) (pediatric): Secondary | ICD-10-CM | POA: Diagnosis present

## 2020-06-21 DIAGNOSIS — Z833 Family history of diabetes mellitus: Secondary | ICD-10-CM | POA: Diagnosis not present

## 2020-06-21 DIAGNOSIS — I480 Paroxysmal atrial fibrillation: Secondary | ICD-10-CM | POA: Diagnosis present

## 2020-06-21 DIAGNOSIS — F419 Anxiety disorder, unspecified: Secondary | ICD-10-CM | POA: Diagnosis present

## 2020-06-21 LAB — HEMOGLOBIN A1C
Hgb A1c MFr Bld: 6.8 % — ABNORMAL HIGH (ref 4.8–5.6)
Mean Plasma Glucose: 148.46 mg/dL

## 2020-06-21 LAB — GLUCOSE, CAPILLARY
Glucose-Capillary: 154 mg/dL — ABNORMAL HIGH (ref 70–99)
Glucose-Capillary: 150 mg/dL — ABNORMAL HIGH (ref 70–99)
Glucose-Capillary: 176 mg/dL — ABNORMAL HIGH (ref 70–99)
Glucose-Capillary: 244 mg/dL — ABNORMAL HIGH (ref 70–99)

## 2020-06-21 MED ORDER — POTASSIUM CHLORIDE CRYS ER 20 MEQ PO TBCR
40.0000 meq | EXTENDED_RELEASE_TABLET | Freq: Once | ORAL | Status: AC
  Administered 2020-06-21: 40 meq via ORAL
  Filled 2020-06-21: qty 2

## 2020-06-21 MED ORDER — GABAPENTIN 300 MG PO CAPS
300.0000 mg | ORAL_CAPSULE | Freq: Three times a day (TID) | ORAL | Status: DC
  Administered 2020-06-21 – 2020-06-22 (×4): 300 mg via ORAL
  Filled 2020-06-21 (×4): qty 1

## 2020-06-21 MED ORDER — FLUTICASONE-UMECLIDIN-VILANT 100-62.5-25 MCG/INH IN AEPB: 1.0000 | INHALATION_SPRAY | Freq: Every day | RESPIRATORY_TRACT | Status: DC

## 2020-06-21 MED ORDER — FLUOXETINE HCL 20 MG PO CAPS
60.0000 mg | ORAL_CAPSULE | Freq: Every day | ORAL | Status: DC
  Administered 2020-06-21 – 2020-06-22 (×2): 60 mg via ORAL
  Filled 2020-06-21 (×2): qty 3

## 2020-06-21 MED ORDER — FUROSEMIDE 10 MG/ML IJ SOLN
40.0000 mg | Freq: Two times a day (BID) | INTRAMUSCULAR | Status: AC
  Administered 2020-06-21 (×2): 40 mg via INTRAVENOUS
  Filled 2020-06-21 (×2): qty 4

## 2020-06-21 MED ORDER — ASPIRIN EC 81 MG PO TBEC
81.0000 mg | DELAYED_RELEASE_TABLET | Freq: Every day | ORAL | Status: DC
  Administered 2020-06-21 – 2020-06-22 (×2): 81 mg via ORAL
  Filled 2020-06-21 (×2): qty 1

## 2020-06-21 MED ORDER — FLUTICASONE FUROATE-VILANTEROL 100-25 MCG/INH IN AEPB
1.0000 | INHALATION_SPRAY | Freq: Every day | RESPIRATORY_TRACT | Status: DC
  Administered 2020-06-21 – 2020-06-22 (×2): 1 via RESPIRATORY_TRACT
  Filled 2020-06-21: qty 28

## 2020-06-21 MED ORDER — BUSPIRONE HCL 5 MG PO TABS
15.0000 mg | ORAL_TABLET | Freq: Two times a day (BID) | ORAL | Status: DC
  Administered 2020-06-21 – 2020-06-22 (×3): 15 mg via ORAL
  Filled 2020-06-21 (×3): qty 3

## 2020-06-21 MED ORDER — ISOSORBIDE MONONITRATE ER 30 MG PO TB24
30.0000 mg | ORAL_TABLET | Freq: Every day | ORAL | Status: DC
  Administered 2020-06-21 – 2020-06-22 (×2): 30 mg via ORAL
  Filled 2020-06-21 (×2): qty 1

## 2020-06-21 MED ORDER — SENNOSIDES-DOCUSATE SODIUM 8.6-50 MG PO TABS: 1.0000 | ORAL_TABLET | Freq: Every evening | ORAL | Status: DC | PRN

## 2020-06-21 MED ORDER — APIXABAN 5 MG PO TABS
5.0000 mg | ORAL_TABLET | Freq: Two times a day (BID) | ORAL | Status: DC
  Administered 2020-06-21 – 2020-06-22 (×4): 5 mg via ORAL
  Filled 2020-06-21 (×4): qty 1

## 2020-06-21 MED ORDER — LOSARTAN POTASSIUM 50 MG PO TABS
50.0000 mg | ORAL_TABLET | Freq: Every day | ORAL | Status: DC
  Administered 2020-06-21 – 2020-06-22 (×2): 50 mg via ORAL
  Filled 2020-06-21 (×2): qty 1

## 2020-06-21 MED ORDER — AMLODIPINE BESYLATE 5 MG PO TABS
5.0000 mg | ORAL_TABLET | Freq: Every day | ORAL | Status: DC
  Administered 2020-06-21 – 2020-06-22 (×2): 5 mg via ORAL
  Filled 2020-06-21 (×2): qty 1

## 2020-06-21 MED ORDER — LUBIPROSTONE 8 MCG PO CAPS
8.0000 ug | ORAL_CAPSULE | Freq: Two times a day (BID) | ORAL | Status: DC
  Administered 2020-06-21 – 2020-06-22 (×3): 8 ug via ORAL
  Filled 2020-06-21 (×3): qty 1

## 2020-06-21 MED ORDER — UMECLIDINIUM BROMIDE 62.5 MCG/INH IN AEPB
1.0000 | INHALATION_SPRAY | Freq: Every day | RESPIRATORY_TRACT | Status: DC
  Administered 2020-06-21 – 2020-06-22 (×2): 1 via RESPIRATORY_TRACT
  Filled 2020-06-21: qty 7

## 2020-06-21 MED ORDER — CARVEDILOL 12.5 MG PO TABS
12.5000 mg | ORAL_TABLET | Freq: Two times a day (BID) | ORAL | Status: DC
  Administered 2020-06-21 – 2020-06-22 (×4): 12.5 mg via ORAL
  Filled 2020-06-21 (×4): qty 1

## 2020-06-21 MED ORDER — PANTOPRAZOLE SODIUM 40 MG PO TBEC
40.0000 mg | DELAYED_RELEASE_TABLET | Freq: Every morning | ORAL | Status: DC
  Administered 2020-06-21 – 2020-06-22 (×2): 40 mg via ORAL
  Filled 2020-06-21 (×2): qty 1

## 2020-06-21 NOTE — Progress Notes (Signed)
PROGRESS NOTE    Kenneth [name]  UGQ:[id number] DOB: [birth year] DOA: 06/20/2020 PCP: Mliss Sax, MD   Brief Narrative:  59 year old with history of COPD, systolic CHF, paroxysmal A. fib on Eliquis, anxiety, depression, DM2, HTN, HLD, OSA on CPAP presents with shortness of breath.  Came to the ER 3 days prior to his admission and was discharged home with steroids, home oxygen and inhalers.  Returns again on 9/29 with similar complaints found to be in COPD exacerbation, started on bronchodilators and steroids.  Chest x-ray was negative for any acute disease, BNP was slightly elevated.   Assessment & Plan:   Principal Problem:   COPD with acute exacerbation (HCC) Active Problems:   Essential hypertension   PAF (paroxysmal atrial fibrillation) (HCC)   Obstructive sleep apnea   Acute on chronic respiratory failure (HCC)  Acute hypoxic respiratory distress secondary to COPD exacerbation -Nebulizers standing and as needed ordered, IV Solu-Medrol which we will eventually transition to oral prednisone.  Home trilogy - Accu-Cheks and sliding scale while on steroids -Chest x-ray-overall clear -Antibiotics= none - Antitussives as needed -Supplemental oxygen as needed, check ambulatory pulse ox if needed - Continue to provide supportive care  Acute mild CHF exacerbation with reduced ejection fraction -Echocardiogram July 2021-EF 45%, mild to moderate aortic valve sclerosis -Lasix 40 mg IV X2 doses -Aspirin, Coreg, losartan  Paroxysmal atrial fibrillation -On Eliquis and Coreg  Anxiety and depression -Continue home BuSpar, Prozac  Diabetes mellitus type 2 with peripheral neuropathy -Home Metformin on hold -Insulin sliding scale and Accu-Chek  Essential hypertension -On amlodipine Coreg and losartan  Obstructive sleep apnea -CPAP at bedtime    DVT prophylaxis: Eliquis Code Status: Full code Family Communication:   None at bedside    Dispo: The patient is from:  Home              Anticipated d/c is to: Home              Anticipated d/c date is: 1 day              Patient currently is not medically stable to d/c.  still having exertional dyspnea requiring IV diuretics.  Maintain hospital stay for at least next 24 hours.       Body mass index is 24.51 kg/m.     Subjective: Patient feels slightly better after getting bronchodilator treatment overnight but still having some exertional dyspnea.  Review of Systems Otherwise negative except as per HPI, including: General: Denies fever, chills, night sweats or unintended weight loss. Resp: Denies hemoptysis Cardiac: Denies chest pain, palpitations, orthopnea, paroxysmal nocturnal dyspnea. GI: Denies abdominal pain, nausea, vomiting, diarrhea or constipation GU: Denies dysuria, frequency, hesitancy or incontinence MS: Denies muscle aches, joint pain or swelling Neuro: Denies headache, neurologic deficits (focal weakness, numbness, tingling), abnormal gait Psych: Denies anxiety, depression, SI/HI/AVH Skin: Denies new rashes or lesions ID: Denies sick contacts, exotic exposures, travel  Examination:  General exam: Appears calm and comfortable, 2 L nasal cannula Respiratory system: Bilateral rhonchi especially at the bases Cardiovascular system: S1 & S2 heard, RRR. No JVD, murmurs, rubs, gallops or clicks. No pedal edema. Gastrointestinal system: Abdomen is nondistended, soft and nontender. No organomegaly or masses felt. Normal bowel sounds heard. Central nervous system: Alert and oriented. No focal neurological deficits. Extremities: Symmetric 5 x 5 power. Skin: No rashes, lesions or ulcers Psychiatry: Judgement and insight appear normal. Mood & affect appropriate.     Objective: Vitals:   06/20/20 2143 06/20/20 2345  06/21/20 0151 06/21/20 0549  BP: (!) 167/80  (!) 155/77 (!) 173/84  Pulse: (!) 56 61 (!) 58 62  Resp: 17 16 17 17   Temp: 97.8 F (36.6 C)  97.9 F (36.6 C) 97.8 F  (36.6 C)  TempSrc: Oral  Oral Oral  SpO2: 100% 100% 100% 100%  Weight: 82 kg     Height:        Intake/Output Summary (Last 24 hours) at 06/21/2020 0815 Last data filed at 06/21/2020 0600 Gross per 24 hour  Intake 480 ml  Output 600 ml  Net -120 ml   Filed Weights   06/20/20 1443 06/20/20 2143  Weight: 83 kg 82 kg     Data Reviewed:   CBC: Recent Labs  Lab 06/17/20 1157 06/20/20 1500  WBC 7.7 6.7  NEUTROABS 5.2 5.0  HGB 12.3* 11.5*  HCT 40.1 37.5*  MCV 95.7 94.0  PLT 253 238   Basic Metabolic Panel: Recent Labs  Lab 06/17/20 1157 06/20/20 1500  NA 137 137  K 4.7 3.6  CL 101 99  CO2 26 30  GLUCOSE 119* 221*  BUN 9 13  CREATININE 1.35* 1.36*  CALCIUM 8.9 8.9   GFR: Estimated Creatinine Clearance: 65 mL/min (A) (by C-G formula based on SCr of 1.36 mg/dL (H)). Liver Function Tests: Recent Labs  Lab 06/17/20 1157  AST 21  ALT 17  ALKPHOS 54  BILITOT 0.7  PROT 6.5  ALBUMIN 3.7   No results for input(s): LIPASE, AMYLASE in the last 168 hours. No results for input(s): AMMONIA in the last 168 hours. Coagulation Profile: No results for input(s): INR, PROTIME in the last 168 hours. Cardiac Enzymes: No results for input(s): CKTOTAL, CKMB, CKMBINDEX, TROPONINI in the last 168 hours. BNP (last 3 results) No results for input(s): PROBNP in the last 8760 hours. HbA1C: No results for input(s): HGBA1C in the last 72 hours. CBG: Recent Labs  Lab 06/20/20 2145 06/21/20 0644  GLUCAP 265* 154*   Lipid Profile: No results for input(s): CHOL, HDL, LDLCALC, TRIG, CHOLHDL, LDLDIRECT in the last 72 hours. Thyroid Function Tests: No results for input(s): TSH, T4TOTAL, FREET4, T3FREE, THYROIDAB in the last 72 hours. Anemia Panel: No results for input(s): VITAMINB12, FOLATE, FERRITIN, TIBC, IRON, RETICCTPCT in the last 72 hours. Sepsis Labs: No results for input(s): PROCALCITON, LATICACIDVEN in the last 168 hours.  Recent Results (from the past 240 hour(s))   Respiratory Panel by RT PCR (Flu A&B, Covid) - Nasopharyngeal Swab     Status: None   Collection Time: 06/17/20 11:54 AM   Specimen: Nasopharyngeal Swab  Result Value Ref Range Status   SARS Coronavirus 2 by RT PCR NEGATIVE NEGATIVE Final    Comment: (NOTE) SARS-CoV-2 target nucleic acids are NOT DETECTED.  The SARS-CoV-2 RNA is generally detectable in upper respiratoy specimens during the acute phase of infection. The lowest concentration of SARS-CoV-2 viral copies this assay can detect is 131 copies/mL. A negative result does not preclude SARS-Cov-2 infection and should not be used as the sole basis for treatment or other patient management decisions. A negative result may occur with  improper specimen collection/handling, submission of specimen other than nasopharyngeal swab, presence of viral mutation(s) within the areas targeted by this assay, and inadequate number of viral copies (<131 copies/mL). A negative result must be combined with clinical observations, patient history, and epidemiological information. The expected result is Negative.  Fact Sheet for Patients:  06/19/20  Fact Sheet for Healthcare Providers:  https://www.moore.com/  This test  is no t yet approved or cleared by the [place] and  has been authorized for detection and/or diagnosis of SARS-CoV-2 by FDA under an Emergency Use Authorization (EUA). This EUA will remain  in effect (meaning this test can be used) for the duration of the COVID-19 declaration under Section 564(b)(1) of the Act, 21 U.S.C. section 360bbb-3(b)(1), unless the authorization is terminated or revoked sooner.     Influenza A by PCR NEGATIVE NEGATIVE Final   Influenza B by PCR NEGATIVE NEGATIVE Final    Comment: (NOTE) The Xpert Xpress SARS-CoV-2/FLU/RSV assay is intended as an aid in  the diagnosis of influenza from Nasopharyngeal swab specimens and  should not be used as  a sole basis for treatment. Nasal washings and  aspirates are unacceptable for Xpert Xpress SARS-CoV-2/FLU/RSV  testing.  Fact Sheet for Patients: https://www.moore.com/  Fact Sheet for Healthcare Providers: https://www.young.biz/  This test is not yet approved or cleared by the [place] FDA and  has been authorized for detection and/or diagnosis of SARS-CoV-2 by  FDA under an Emergency Use Authorization (EUA). This EUA will remain  in effect (meaning this test can be used) for the duration of the  Covid-19 declaration under Section 564(b)(1) of the Act, 21  U.S.C. section 360bbb-3(b)(1), unless the authorization is  terminated or revoked. Performed at [facility], [address] [place], [place] [address]   Respiratory Panel by RT PCR (Flu A&B, Covid) - Nasopharyngeal Swab     Status: None   Collection Time: 06/20/20  2:48 PM   Specimen: Nasopharyngeal Swab  Result Value Ref Range Status   SARS Coronavirus 2 by RT PCR NEGATIVE NEGATIVE Final    Comment: (NOTE) SARS-CoV-2 target nucleic acids are NOT DETECTED.  The SARS-CoV-2 RNA is generally detectable in upper respiratoy specimens during the acute phase of infection. The lowest concentration of SARS-CoV-2 viral copies this assay can detect is 131 copies/mL. A negative result does not preclude SARS-Cov-2 infection and should not be used as the sole basis for treatment or other patient management decisions. A negative result may occur with  improper specimen collection/handling, submission of specimen other than nasopharyngeal swab, presence of viral mutation(s) within the areas targeted by this assay, and inadequate number of viral copies (<131 copies/mL). A negative result must be combined with clinical observations, patient history, and epidemiological information. The expected result is Negative.  Fact Sheet for Patients:   https://www.moore.com/  Fact Sheet for Healthcare Providers:  https://www.young.biz/  This test is no t yet approved or cleared by the [place] FDA and  has been authorized for detection and/or diagnosis of SARS-CoV-2 by FDA under an Emergency Use Authorization (EUA). This EUA will remain  in effect (meaning this test can be used) for the duration of the COVID-19 declaration under Section 564(b)(1) of the Act, 21 U.S.C. section 360bbb-3(b)(1), unless the authorization is terminated or revoked sooner.     Influenza A by PCR NEGATIVE NEGATIVE Final   Influenza B by PCR NEGATIVE NEGATIVE Final    Comment: (NOTE) The Xpert Xpress SARS-CoV-2/FLU/RSV assay is intended as an aid in  the diagnosis of influenza from Nasopharyngeal swab specimens and  should not be used as a sole basis for treatment. Nasal washings and  aspirates are unacceptable for Xpert Xpress SARS-CoV-2/FLU/RSV  testing.  Fact Sheet for Patients: https://www.moore.com/  Fact Sheet for Healthcare Providers: https://www.young.biz/  This test is not yet approved or cleared by the [place] FDA and  has been  authorized for detection and/or diagnosis of SARS-CoV-2 by  FDA under an Emergency Use Authorization (EUA). This EUA will remain  in effect (meaning this test can be used) for the duration of the  Covid-19 declaration under Section 564(b)(1) of the Act, 21  U.S.C. section 360bbb-3(b)(1), unless the authorization is  terminated or revoked. Performed at [facility], [address] [place], [place] [address]          Radiology Studies: DG Chest Port 1 View  Result Date: 06/20/2020 CLINICAL DATA:  Shortness of breath EXAM: PORTABLE CHEST 1 VIEW. The right costophrenic angle is collimated off view. COMPARISON:  Chest x-ray 06/17/2020, CT chest 03/03/2000 FINDINGS: The heart size and mediastinal contours are unchanged.  Aortic arch calcifications. In the Right apical bladder of/bulla better appreciated on CT 03/03/2020. No focal consolidation. No pulmonary edema. No pleural effusion. No pneumothorax. No acute osseous abnormality. IMPRESSION: No active disease. Electronically Signed   By: Tish Frederickson M.D.   On: 06/20/2020 15:02        Scheduled Meds: . apixaban  5 mg Oral BID  . budesonide (PULMICORT) nebulizer solution  0.25 mg Nebulization BID  . carvedilol  12.5 mg Oral BID WC  . dextromethorphan-guaiFENesin  1 tablet Oral BID  . insulin aspart  0-5 Units Subcutaneous QHS  . insulin aspart  0-9 Units Subcutaneous TID WC  . ipratropium-albuterol  3 mL Nebulization TID  . methylPREDNISolone (SOLU-MEDROL) injection  60 mg Intravenous Q8H   Continuous Infusions:   LOS: 0 days   Time spent= 35 mins    Tkeya Stencil Joline Maxcy, MD [facility]  If 7PM-7AM, please contact night-coverage  06/21/2020, 8:15 AM

## 2020-06-21 NOTE — Discharge Instructions (Signed)

## 2020-06-21 NOTE — TOC Initial Note (Signed)
Transition of Care [facility]) - Initial/Assessment Note    Patient Details  Name: Kenneth [name] MRN: [id number] Date of Birth: [birth year]  [facility] CM/SW Contact:    Bess Kinds, RN Phone Number: [phone] 06/21/2020, 3:30 PM  Clinical Narrative:                  Spoke with patient at the bedside. PTA home with fiance. Has home oxygen through [employer]. Has a travel tank for transportation home. TOC following for transition needs.   Expected Discharge Plan: Home/Self Care Barriers to Discharge: Continued Medical Work up   Patient Goals and CMS Choice Patient states their goals for this hospitalization and ongoing recovery are:: return home CMS Medicare.gov Compare Post Acute Care list provided to:: Patient Choice offered to / list presented to : NA  Expected Discharge Plan and Services Expected Discharge Plan: Home/Self Care In-house [facility]charge Planning Services: CM Consult Post Acute Care Choice: NA Living arrangements for the past 2 months: Apartment                 DME Arranged: N/A DME Agency: NA       HH Arranged: NA HH Agency: NA        Prior Living Arrangements/Services Living arrangements for the past 2 months: Apartment Lives with:: Self, Significant Other Patient language and need for interpreter reviewed:: Yes Do you feel safe going back to the place where you live?: Yes      Need for Family Participation in Patient Care: No (Comment)   Current home services: DME (oxygen) Criminal Activity/Legal Involvement Pertinent to Current Situation/Hospitalization: No - Comment as needed  Activities of Daily Living Home Assistive Devices/Equipment: Oxygen ADL Screening (condition at time of admission) Patient's cognitive ability adequate to safely complete daily activities?: Yes Is the patient deaf or have difficulty hearing?: No Does the patient have difficulty seeing, even when wearing glasses/contacts?: No Does the patient have  difficulty concentrating, remembering, or making decisions?: No Patient able to express need for assistance with ADLs?: Yes Does the patient have difficulty dressing or bathing?: No Independently performs ADLs?: Yes (appropriate for developmental age) Does the patient have difficulty walking or climbing stairs?: Yes Weakness of Legs: None Weakness of Arms/Hands: None  Permission Sought/Granted                  Emotional Assessment Appearance:: Appears stated age Attitude/Demeanor/Rapport: Engaged Affect (typically observed): Accepting Orientation: : Oriented to Self, Oriented to  Time, Oriented to Place, Oriented to Situation Alcohol / Substance Use: Not Applicable Psych Involvement: No (comment)  Admission diagnosis:  COPD exacerbation (HCC) [J44.1] COPD with acute exacerbation (HCC) [J44.1] Patient Active Problem List   Diagnosis Date Noted  . COPD with acute exacerbation (HCC) 06/20/2020  . Acute on chronic respiratory failure (HCC) 06/20/2020  . Chronic respiratory failure with hypoxia (HCC) 06/01/2020  . Obstructive sleep apnea 05/02/2020  . Type 2 diabetes mellitus with diabetic neuropathy, without long-term current use of insulin (HCC) 04/24/2020  . Hyperlipidemia   . Acute on chronic combined systolic and diastolic CHF (congestive heart failure) (HCC) 04/07/2020  . Acute respiratory failure with hypoxia (HCC) 04/06/2020  . Acute on chronic respiratory failure with hypoxemia (HCC) 02/08/2020  . Chronic constipation 07/19/2019  . Hospital discharge follow-up 06/17/2019  . Shortness of breath 06/12/2019  . Chest tightness 06/12/2019  . Snoring 03/29/2019  . Insomnia due to other mental disorder 11/15/2018  . Anticoagulant long-term use 10/21/2018  . Gastroesophageal  reflux disease without esophagitis 08/10/2018  . Tobacco abuse 08/10/2018  . Chest pain 07/27/2018  . PAF (paroxysmal atrial fibrillation) (HCC) 07/27/2018  . Mild renal insufficiency 07/27/2018  .  Chronic combined systolic and diastolic heart failure (HCC) 07/27/2018  . Slow transit constipation 07/20/2018  . Essential hypertension 07/06/2018  . Chronic obstructive pulmonary disease (HCC) 07/06/2018  . Controlled type 2 diabetes mellitus without complication, without long-term current use of insulin (HCC) 07/06/2018  . Depression 07/06/2018  . Macrocytic anemia 07/06/2018  . Healthcare maintenance 07/06/2018  . Alcohol abuse 07/06/2018   PCP:  Mliss Sax, MD Pharmacy:   [facility] [id number] [place], [place] - [phone] AT [facility] [address] [place] [address] Phone: [phone] Fax: [phone]     Social Determinants of Health (SDOH) Interventions    Readmission Risk Interventions No flowsheet data found.

## 2020-06-21 NOTE — Progress Notes (Signed)
New Admission Note:   Arrival Method: ED via stretcher Mental Orientation: Alert & oriented x4 Telemetry: 36m15, CCMD notified Assessment: Completed Skin: Intact IV: LFA, saline locked Pain: 0/10 Tubes: None Safety Measures: Safety Fall Prevention Plan has been discussed  Admission: to be completed 5 [place] Orientation: Patient has been orientated to the room, unit and staff.   Family: none at bedside  Orders to be reviewed and implemented. Will continue to monitor the patient. Call light has been placed within reach and bed alarm has been activated.

## 2020-06-21 NOTE — Plan of Care (Signed)
  Problem: Education: Goal: Knowledge of General Education information will improve Description Including pain rating scale, medication(s)/side effects and non-pharmacologic comfort measures Outcome: Progressing   Problem: Health Behavior/Discharge Planning: Goal: Ability to manage health-related needs will improve Outcome: Progressing   

## 2020-06-21 NOTE — Care Management Obs Status (Signed)
MEDICARE OBSERVATION STATUS NOTIFICATION   Patient Details  Name: Kenneth [name] MRN: [id number] Date of Birth: 1961-07-07   Medicare Observation Status Notification Given:  Yes    Bess Kinds, RN 06/21/2020, 3:17 PM

## 2020-06-22 ENCOUNTER — Other Ambulatory Visit (HOSPITAL_COMMUNITY): Payer: Self-pay | Admitting: Internal Medicine

## 2020-06-22 LAB — BASIC METABOLIC PANEL
Anion gap: 12 (ref 5–15)
BUN: 24 mg/dL — ABNORMAL HIGH (ref 6–20)
CO2: 27 mmol/L (ref 22–32)
Calcium: 9.3 mg/dL (ref 8.9–10.3)
Chloride: 96 mmol/L — ABNORMAL LOW (ref 98–111)
Creatinine, Ser: 1.34 mg/dL — ABNORMAL HIGH (ref 0.61–1.24)
GFR calc Af Amer: >60 mL/min (ref >60)
GFR calc non Af Amer: 58 mL/min — ABNORMAL LOW (ref >60)
Glucose, Bld: 259 mg/dL — ABNORMAL HIGH (ref 70–99)
Potassium: 4.5 mmol/L (ref 3.5–5.1)
Sodium: 135 mmol/L (ref 135–145)

## 2020-06-22 LAB — MAGNESIUM: Magnesium: 2.1 mg/dL (ref 1.7–2.4)

## 2020-06-22 LAB — CBC
HCT: 36.9 % — ABNORMAL LOW (ref 39.0–52.0)
Hemoglobin: 11.4 g/dL — ABNORMAL LOW (ref 13.0–17.0)
MCH: 28.9 pg (ref 26.0–34.0)
MCHC: 30.9 g/dL (ref 30.0–36.0)
MCV: 93.4 fL (ref 80.0–100.0)
Platelets: 230 10*3/uL (ref 150–400)
RBC: 3.95 MIL/uL — ABNORMAL LOW (ref 4.22–5.81)
RDW: 13.2 % (ref 11.5–15.5)
WBC: 10.7 10*3/uL — ABNORMAL HIGH (ref 4.0–10.5)
nRBC: 0 % (ref 0.0–0.2)

## 2020-06-22 LAB — GLUCOSE, CAPILLARY: Glucose-Capillary: 223 mg/dL — ABNORMAL HIGH (ref 70–99)

## 2020-06-22 MED ORDER — PREDNISONE 10 MG PO TABS: 40.0000 mg | ORAL_TABLET | Freq: Every day | ORAL | 0 refills | Status: AC

## 2020-06-22 MED FILL — predniSONE 10 MG TABS: 10 | 3 days supply | Qty: 12 | Fill #0

## 2020-06-22 NOTE — Progress Notes (Signed)
Kenneth [name] to be discharged Home per MD order. Discussed prescriptions and follow up appointments with the patient. Prescriptions given to patient; medication list explained in detail. Patient verbalized understanding.  Skin clean, dry and intact without evidence of skin break down, no evidence of skin tears noted. IV catheter discontinued intact. Site without signs and symptoms of complications. Dressing and pressure applied. Pt denies pain at the site currently. No complaints noted.  Patient free of lines, drains, and wounds.   An After Visit Summary (AVS) was printed and given to the patient. Patient escorted via wheelchair, and discharged home via private auto.  Shanna Cisco, RN

## 2020-06-22 NOTE — Progress Notes (Signed)
Patient up on RA at 92%.  While walking went up to 95% on RA.

## 2020-06-22 NOTE — Plan of Care (Signed)

## 2020-06-22 NOTE — Discharge Summary (Signed)
Physician Discharge Summary  Kenneth [name] [id number] DOB: 1961/05/09 DOA: 06/20/2020  PCP: [name], MD  Admit date: 06/20/2020 Discharge date: 06/22/2020  Admitted From: Home Disposition: Home  Recommendations for Outpatient Follow-up:  1. Follow up with PCP in 1-2 weeks 2. Please obtain BMP/CBC in one week your next doctors visit.  3. Oral prednisone prescribed.  Continue using home bronchodilators 4. Doxycycline stopped   Discharge Condition: Stable CODE STATUS: Full code Diet recommendation: Heart healthy  Brief/Interim Summary: 59 year old with history of COPD, systolic CHF, paroxysmal A. fib on Eliquis, anxiety, depression, DM2, HTN, HLD, OSA on CPAP presents with shortness of breath.  Came to the ER 3 days prior to his admission and was discharged home with steroids, home oxygen and inhalers.  Returns again on 9/29 with similar complaints found to be in COPD exacerbation, started on bronchodilators and steroids.  Chest x-ray was negative for any acute disease, BNP was slightly elevated.  During hospitalization he was diuresed, received aggressive bronchodilators and steroids.  Over the course of hospital stay symptoms significantly improved and was weaned off oxygen.  With ambulation his oxygen levels remained greater than 90%. Patient is medically stable for discharge.   Assessment & Plan:   Principal Problem:   COPD with acute exacerbation ([place]) Active Problems:   Essential hypertension   PAF (paroxysmal atrial fibrillation) (HCC)   Obstructive sleep apnea   Acute on chronic respiratory failure (HCC)  Acute hypoxic respiratory distress secondary to COPD exacerbation -Respiratory status is back to baseline.  Transition to p.o. prednisone.  Advised to continue using home bronchodilators. -Chest x-ray clear.  With ambulation oxygen saturation remains greater than 90% without shortness of breath  Acute mild CHF exacerbation with reduced ejection  fraction -Echocardiogram July 2021-EF 45%, mild to moderate aortic valve sclerosis -Resolved with IV diuretics.  Resume home regimen -Aspirin, Coreg, losartan  Paroxysmal atrial fibrillation -On Eliquis and Coreg  Anxiety and depression -Continue home BuSpar, Prozac  Diabetes mellitus type 2 with peripheral neuropathy -Resume home Metformin  Essential hypertension -On amlodipine Coreg and losartan  Obstructive sleep apnea -CPAP at bedtime   Body mass index is 24.52 kg/m.         Discharge Diagnoses:  Principal Problem:   COPD with acute exacerbation ([place]) Active Problems:   Essential hypertension   PAF (paroxysmal atrial fibrillation) (HCC)   Obstructive sleep apnea   Acute on chronic respiratory failure (HCC)   Acute respiratory distress      Subjective: Feels great wishes to go home.  Discharge Exam: Vitals:   06/22/20 0854 06/22/20 0912  BP:  (!) 147/76  Pulse:  (!) 57  Resp:  18  Temp:  98 F (36.7 C)  SpO2: 100% 99%   Vitals:   06/22/20 0600 06/22/20 0851 06/22/20 0854 06/22/20 0912  BP:    (!) 147/76  Pulse:    (!) 57  Resp:    18  Temp:    98 F (36.7 C)  TempSrc:    Oral  SpO2: 97% 100% 100% 99%  Weight:      Height:        General: Pt is alert, awake, not in acute distress Cardiovascular: RRR, S1/S2 +, no rubs, no gallops Respiratory: CTA bilaterally, no wheezing, no rhonchi Abdominal: Soft, NT, ND, bowel sounds + Extremities: no edema, no cyanosis  Discharge Instructions   Allergies as of 06/22/2020      Reactions   Lisinopril Swelling   Other Other (See Comments)  Lettuce : rash   Tomato Rash      Medication List    STOP taking these medications   doxycycline 100 MG tablet Commonly known as: VIBRA-TABS     TAKE these medications   acetaminophen 325 MG tablet Commonly known as: TYLENOL Take 650 mg by mouth every 6 (six) hours as needed for mild pain or headache.   albuterol 108 (90 Base) MCG/ACT  inhaler Commonly known as: VENTOLIN HFA Inhale 1-2 puffs into the lungs every 4 (four) hours as needed for wheezing or shortness of breath. What changed: Another medication with the same name was changed. Make sure you understand how and when to take each.   albuterol (2.5 MG/3ML) 0.083% nebulizer solution Commonly known as: PROVENTIL INHALE 1 VIAL VIA NEBULIZER 5 TIMES DAILY AS NEEDED FOR WHEEZING OR FOR SHORTNESS OF BREATH What changed: See the new instructions.   amLODipine 5 MG tablet Commonly known as: NORVASC Take 1 tablet (5 mg total) by mouth daily.   apixaban 5 MG Tabs tablet Commonly known as: Eliquis Take 1 tablet (5 mg total) by mouth 2 (two) times daily.   aspirin EC 81 MG tablet Take 81 mg by mouth daily.   busPIRone 15 MG tablet Commonly known as: BUSPAR Take 1 tablet (15 mg total) by mouth 2 (two) times daily.   carvedilol 12.5 MG tablet Commonly known as: COREG Take 1 tablet (12.5 mg total) by mouth 2 (two) times daily with a meal.   famotidine 20 MG tablet Commonly known as: PEPCID TAKE 1 TABLET BY MOUTH  DAILY   FLUoxetine 20 MG capsule Commonly known as: PROZAC TAKE 3 CAPSULES BY MOUTH  DAILY   furosemide 20 MG tablet Commonly known as: LASIX TAKE 1 TABLET BY MOUTH  DAILY   gabapentin 300 MG capsule Commonly known as: NEURONTIN Take 1 capsule (300 mg total) by mouth 3 (three) times daily.   ipratropium 0.02 % nebulizer solution Commonly known as: ATROVENT Take 2.5 mLs (0.5 mg total) by nebulization 4 (four) times daily.   isosorbide mononitrate 30 MG 24 hr tablet Commonly known as: IMDUR Take 1 tablet (30 mg total) by mouth daily.   losartan 25 MG tablet Commonly known as: COZAAR Take 25 mg by mouth daily. What changed: Another medication with the same name was removed. Continue taking this medication, and follow the directions you see here.   lubiprostone 8 MCG capsule Commonly known as: [name] TAKE 1 CAPSULE(8 MCG) BY MOUTH TWICE  DAILY WITH A MEAL What changed:   how much to take  how to take this  when to take this  additional instructions   metFORMIN 500 MG tablet Commonly known as: GLUCOPHAGE TAKE 1 TABLET BY MOUTH  TWICE DAILY WITH MEALS   multivitamin with minerals Tabs tablet Take 1 tablet by mouth daily.   nitroGLYCERIN 0.4 MG SL tablet Commonly known as: NITROSTAT DISSOLVE 1 TABLET UNDER THE TONGUE EVERY 5 MINUTES AS  NEEDED FOR CHEST PAIN. MAX  OF 3 TABLETS IN 15 MINUTES. CALL 911 IF PAIN PERSISTS. What changed: See the new instructions.   ONE TOUCH ULTRA 2 w/Device Kit Use to test blood sugars 1-2 times daily.   OneTouch Ultra test strip Generic drug: glucose blood USE TO TEST BLOOD SUGAR 1-2 TIMES DAILY   onetouch ultrasoft lancets USE TO TEST BLOOD SUGAR 1-2 TIMES DAILY   pantoprazole 20 MG tablet Commonly known as: PROTONIX Take 20 mg by mouth daily. What changed: Another medication with the same name  was removed. Continue taking this medication, and follow the directions you see here.   predniSONE 10 MG tablet Commonly known as: DELTASONE Take 4 tablets (40 mg total) by mouth daily with breakfast for 3 days.   traZODone 50 MG tablet Commonly known as: DESYREL TAKE 1/2 TO 1 TABLET BY  MOUTH AT BEDTIME AS NEEDED  FOR SLEEP.   Trelegy Ellipta 100-62.5-25 MCG/INH Aepb Generic drug: Fluticasone-Umeclidin-Vilant Inhale 1 puff into the lungs daily.       Follow-up Information    [name], MD. Schedule an appointment as soon as possible for a visit in 1 week(s).   Specialty: Family Medicine Contact information: [place] [address] [phone]        Buford Dresser, MD .   Specialty: Cardiology Contact information: [address] [place] [address] [phone]              Allergies  Allergen Reactions  . Lisinopril Swelling  . Other Other (See Comments)    Lettuce : rash  . Tomato Rash     You were cared for by a hospitalist during your hospital stay. If you have any questions about your discharge medications or the care you received while you were in the hospital after you are discharged, you can call the unit and asked to speak with the hospitalist on call if the hospitalist that took care of you is not available. Once you are discharged, your primary care physician will handle any further medical issues. Please note that no refills for any discharge medications will be authorized once you are discharged, as it is imperative that you return to your primary care physician (or establish a relationship with a primary care physician if you do not have one) for your aftercare needs so that they can reassess your need for medications and monitor your lab values.   Procedures/Studies: DG Chest 2 View  Result Date: 06/01/2020 CLINICAL DATA:  COPD EXAM: CHEST - 2 VIEW COMPARISON:  04/13/2020 FINDINGS: Lungs are hyperinflated as can be seen with COPD. No focal consolidation. No pleural effusion or pneumothorax. Heart and mediastinal contours are unremarkable. No acute osseous abnormality. IMPRESSION: No active cardiopulmonary disease. Electronically Signed   By: Kathreen Devoid   On: 06/01/2020 10:31   DG Chest Port 1 View  Result Date: 06/20/2020 CLINICAL DATA:  Shortness of breath EXAM: PORTABLE CHEST 1 VIEW. The right costophrenic angle is collimated off view. COMPARISON:  Chest x-ray 06/17/2020, CT chest 03/03/2000 FINDINGS: The heart size and mediastinal contours are unchanged. Aortic arch calcifications. In the Right apical bladder of/bulla better appreciated on CT 03/03/2020. No focal consolidation. No pulmonary edema. No pleural effusion. No pneumothorax. No acute osseous abnormality. IMPRESSION: No active disease. Electronically Signed   By: Iven Finn M.D.   On: 06/20/2020 15:02   DG Chest Port 1 View  Result Date: 06/17/2020 CLINICAL DATA:  Shortness of breath. EXAM: PORTABLE  CHEST 1 VIEW COMPARISON:  June 01, 2020 FINDINGS: Cardiomediastinal silhouette is normal. Mediastinal contours appear intact. There is no evidence of focal airspace consolidation, pleural effusion or pneumothorax. Osseous structures are without acute abnormality. Soft tissues are grossly normal. IMPRESSION: No active disease. Electronically Signed   By: Fidela Salisbury M.D.   On: 06/17/2020 12:08      The results of significant diagnostics from this hospitalization (including imaging, microbiology, ancillary and laboratory) are listed below for reference.     Microbiology: Recent Results (from the past 240  hour(s))  Respiratory Panel by RT PCR (Flu A&B, Covid) - Nasopharyngeal Swab     Status: None   Collection Time: 06/17/20 11:54 AM   Specimen: Nasopharyngeal Swab  Result Value Ref Range Status   SARS Coronavirus 2 by RT PCR NEGATIVE NEGATIVE Final    Comment: (NOTE) SARS-CoV-2 target nucleic acids are NOT DETECTED.  The SARS-CoV-2 RNA is generally detectable in upper respiratoy specimens during the acute phase of infection. The lowest concentration of SARS-CoV-2 viral copies this assay can detect is 131 copies/mL. A negative result does not preclude SARS-Cov-2 infection and should not be used as the sole basis for treatment or other patient management decisions. A negative result may occur with  improper specimen collection/handling, submission of specimen other than nasopharyngeal swab, presence of viral mutation(s) within the areas targeted by this assay, and inadequate number of viral copies (<131 copies/mL). A negative result must be combined with clinical observations, patient history, and epidemiological information. The expected result is Negative.  Fact Sheet for Patients:  PinkCheek.be  Fact Sheet for Healthcare Providers:  GravelBags.it  This test is no t yet approved or cleared by the [place]  FDA and  has been authorized for detection and/or diagnosis of SARS-CoV-2 by FDA under an Emergency Use Authorization (EUA). This EUA will remain  in effect (meaning this test can be used) for the duration of the COVID-19 declaration under Section 564(b)(1) of the Act, 21 U.S.C. section 360bbb-3(b)(1), unless the authorization is terminated or revoked sooner.     Influenza A by PCR NEGATIVE NEGATIVE Final   Influenza B by PCR NEGATIVE NEGATIVE Final    Comment: (NOTE) The Xpert Xpress SARS-CoV-2/FLU/RSV assay is intended as an aid in  the diagnosis of influenza from Nasopharyngeal swab specimens and  should not be used as a sole basis for treatment. Nasal washings and  aspirates are unacceptable for Xpert Xpress SARS-CoV-2/FLU/RSV  testing.  Fact Sheet for Patients: PinkCheek.be  Fact Sheet for Healthcare Providers: GravelBags.it  This test is not yet approved or cleared by the [place] FDA and  has been authorized for detection and/or diagnosis of SARS-CoV-2 by  FDA under an Emergency Use Authorization (EUA). This EUA will remain  in effect (meaning this test can be used) for the duration of the  Covid-19 declaration under Section 564(b)(1) of the Act, 21  U.S.C. section 360bbb-3(b)(1), unless the authorization is  terminated or revoked. Performed at [facility], [place] [address] [place], [place] [address]   Respiratory Panel by RT PCR (Flu A&B, Covid) - Nasopharyngeal Swab     Status: None   Collection Time: 06/20/20  2:48 PM   Specimen: Nasopharyngeal Swab  Result Value Ref Range Status   SARS Coronavirus 2 by RT PCR NEGATIVE NEGATIVE Final    Comment: (NOTE) SARS-CoV-2 target nucleic acids are NOT DETECTED.  The SARS-CoV-2 RNA is generally detectable in upper respiratoy specimens during the acute phase of infection. The lowest concentration of SARS-CoV-2 viral copies this assay can detect is 131  copies/mL. A negative result does not preclude SARS-Cov-2 infection and should not be used as the sole basis for treatment or other patient management decisions. A negative result may occur with  improper specimen collection/handling, submission of specimen other than nasopharyngeal swab, presence of viral mutation(s) within the areas targeted by this assay, and inadequate number of viral copies (<131 copies/mL). A negative result must be combined with clinical observations, patient history, and epidemiological information. The expected result is Negative.  Fact Sheet for Patients:  PinkCheek.be  Fact Sheet for Healthcare Providers:  GravelBags.it  This test is no t yet approved or cleared by the [place] FDA and  has been authorized for detection and/or diagnosis of SARS-CoV-2 by FDA under an Emergency Use Authorization (EUA). This EUA will remain  in effect (meaning this test can be used) for the duration of the COVID-19 declaration under Section 564(b)(1) of the Act, 21 U.S.C. section 360bbb-3(b)(1), unless the authorization is terminated or revoked sooner.     Influenza A by PCR NEGATIVE NEGATIVE Final   Influenza B by PCR NEGATIVE NEGATIVE Final    Comment: (NOTE) The Xpert Xpress SARS-CoV-2/FLU/RSV assay is intended as an aid in  the diagnosis of influenza from Nasopharyngeal swab specimens and  should not be used as a sole basis for treatment. Nasal washings and  aspirates are unacceptable for Xpert Xpress SARS-CoV-2/FLU/RSV  testing.  Fact Sheet for Patients: PinkCheek.be  Fact Sheet for Healthcare Providers: GravelBags.it  This test is not yet approved or cleared by the [place] FDA and  has been authorized for detection and/or diagnosis of SARS-CoV-2 by  FDA under an Emergency Use Authorization (EUA). This EUA will remain  in effect (meaning  this test can be used) for the duration of the  Covid-19 declaration under Section 564(b)(1) of the Act, 21  U.S.C. section 360bbb-3(b)(1), unless the authorization is  terminated or revoked. Performed at [facility], [place] [address], [place], [place] [address]      Labs: BNP (last 3 results) Recent Labs    04/13/20 1755 06/17/20 1158 06/20/20 1534  BNP 1,267.7* 964.5* 0,762.2*   Basic Metabolic Panel: Recent Labs  Lab 06/17/20 1157 06/20/20 1500 06/22/20 0835  NA 137 137 135  K 4.7 3.6 4.5  CL 101 99 96*  CO2 26 30 27   GLUCOSE 119* 221* 259*  BUN 9 13 24*  CREATININE 1.35* 1.36* 1.34*  CALCIUM 8.9 8.9 9.3  MG  --   --  2.1   Liver Function Tests: Recent Labs  Lab 06/17/20 1157  AST 21  ALT 17  ALKPHOS 54  BILITOT 0.7  PROT 6.5  ALBUMIN 3.7   No results for input(s): LIPASE, AMYLASE in the last 168 hours. No results for input(s): AMMONIA in the last 168 hours. CBC: Recent Labs  Lab 06/17/20 1157 06/20/20 1500 06/22/20 0835  WBC 7.7 6.7 10.7*  NEUTROABS 5.2 5.0  --   HGB 12.3* 11.5* 11.4*  HCT 40.1 37.5* 36.9*  MCV 95.7 94.0 93.4  PLT 253 238 230   Cardiac Enzymes: No results for input(s): CKTOTAL, CKMB, CKMBINDEX, TROPONINI in the last 168 hours. BNP: Invalid input(s): POCBNP CBG: Recent Labs  Lab 06/21/20 0644 06/21/20 1138 06/21/20 1625 06/21/20 2129 06/22/20 0645  GLUCAP 154* 150* 176* 244* 223*   D-Dimer No results for input(s): DDIMER in the last 72 hours. Hgb A1c Recent Labs    06/21/20 0914  HGBA1C 6.8*   Lipid Profile No results for input(s): CHOL, HDL, LDLCALC, TRIG, CHOLHDL, LDLDIRECT in the last 72 hours. Thyroid function studies No results for input(s): TSH, T4TOTAL, T3FREE, THYROIDAB in the last 72 hours.  Invalid input(s): FREET3 Anemia work up No results for input(s): VITAMINB12, FOLATE, FERRITIN, TIBC, IRON, RETICCTPCT in the last 72 hours. Urinalysis    Component Value Date/Time   COLORURINE YELLOW  05/20/2019 0825   APPEARANCEUR CLEAR 05/20/2019 0825   LABSPEC 1.020 05/20/2019 0825   PHURINE 6.0 05/20/2019 0825  GLUCOSEU NEGATIVE 05/20/2019 0825   HGBUR NEGATIVE 05/20/2019 0825   BILIRUBINUR SMALL (A) 05/20/2019 0825   KETONESUR TRACE (A) 05/20/2019 0825   UROBILINOGEN 1.0 05/20/2019 0825   NITRITE NEGATIVE 05/20/2019 0825   LEUKOCYTESUR NEGATIVE 05/20/2019 0825   Sepsis Labs Invalid input(s): PROCALCITONIN,  WBC,  LACTICIDVEN Microbiology Recent Results (from the past 240 hour(s))  Respiratory Panel by RT PCR (Flu A&B, Covid) - Nasopharyngeal Swab     Status: None   Collection Time: 06/17/20 11:54 AM   Specimen: Nasopharyngeal Swab  Result Value Ref Range Status   SARS Coronavirus 2 by RT PCR NEGATIVE NEGATIVE Final    Comment: (NOTE) SARS-CoV-2 target nucleic acids are NOT DETECTED.  The SARS-CoV-2 RNA is generally detectable in upper respiratoy specimens during the acute phase of infection. The lowest concentration of SARS-CoV-2 viral copies this assay can detect is 131 copies/mL. A negative result does not preclude SARS-Cov-2 infection and should not be used as the sole basis for treatment or other patient management decisions. A negative result may occur with  improper specimen collection/handling, submission of specimen other than nasopharyngeal swab, presence of viral mutation(s) within the areas targeted by this assay, and inadequate number of viral copies (<131 copies/mL). A negative result must be combined with clinical observations, patient history, and epidemiological information. The expected result is Negative.  Fact Sheet for Patients:  PinkCheek.be  Fact Sheet for Healthcare Providers:  GravelBags.it  This test is no t yet approved or cleared by the [place] FDA and  has been authorized for detection and/or diagnosis of SARS-CoV-2 by FDA under an Emergency Use Authorization (EUA). This  EUA will remain  in effect (meaning this test can be used) for the duration of the COVID-19 declaration under Section 564(b)(1) of the Act, 21 U.S.C. section 360bbb-3(b)(1), unless the authorization is terminated or revoked sooner.     Influenza A by PCR NEGATIVE NEGATIVE Final   Influenza B by PCR NEGATIVE NEGATIVE Final    Comment: (NOTE) The Xpert Xpress SARS-CoV-2/FLU/RSV assay is intended as an aid in  the diagnosis of influenza from Nasopharyngeal swab specimens and  should not be used as a sole basis for treatment. Nasal washings and  aspirates are unacceptable for Xpert Xpress SARS-CoV-2/FLU/RSV  testing.  Fact Sheet for Patients: PinkCheek.be  Fact Sheet for Healthcare Providers: GravelBags.it  This test is not yet approved or cleared by the [place] FDA and  has been authorized for detection and/or diagnosis of SARS-CoV-2 by  FDA under an Emergency Use Authorization (EUA). This EUA will remain  in effect (meaning this test can be used) for the duration of the  Covid-19 declaration under Section 564(b)(1) of the Act, 21  U.S.C. section 360bbb-3(b)(1), unless the authorization is  terminated or revoked. Performed at [facility], [place] [address], [place], [place] [address]   Respiratory Panel by RT PCR (Flu A&B, Covid) - Nasopharyngeal Swab     Status: None   Collection Time: 06/20/20  2:48 PM   Specimen: Nasopharyngeal Swab  Result Value Ref Range Status   SARS Coronavirus 2 by RT PCR NEGATIVE NEGATIVE Final    Comment: (NOTE) SARS-CoV-2 target nucleic acids are NOT DETECTED.  The SARS-CoV-2 RNA is generally detectable in upper respiratoy specimens during the acute phase of infection. The lowest concentration of SARS-CoV-2 viral copies this assay can detect is 131 copies/mL. A negative result does not preclude SARS-Cov-2 infection and should not be used as the sole basis for treatment or  other  patient management decisions. A negative result may occur with  improper specimen collection/handling, submission of specimen other than nasopharyngeal swab, presence of viral mutation(s) within the areas targeted by this assay, and inadequate number of viral copies (<131 copies/mL). A negative result must be combined with clinical observations, patient history, and epidemiological information. The expected result is Negative.  Fact Sheet for Patients:  PinkCheek.be  Fact Sheet for Healthcare Providers:  GravelBags.it  This test is no t yet approved or cleared by the [place] FDA and  has been authorized for detection and/or diagnosis of SARS-CoV-2 by FDA under an Emergency Use Authorization (EUA). This EUA will remain  in effect (meaning this test can be used) for the duration of the COVID-19 declaration under Section 564(b)(1) of the Act, 21 U.S.C. section 360bbb-3(b)(1), unless the authorization is terminated or revoked sooner.     Influenza A by PCR NEGATIVE NEGATIVE Final   Influenza B by PCR NEGATIVE NEGATIVE Final    Comment: (NOTE) The Xpert Xpress SARS-CoV-2/FLU/RSV assay is intended as an aid in  the diagnosis of influenza from Nasopharyngeal swab specimens and  should not be used as a sole basis for treatment. Nasal washings and  aspirates are unacceptable for Xpert Xpress SARS-CoV-2/FLU/RSV  testing.  Fact Sheet for Patients: PinkCheek.be  Fact Sheet for Healthcare Providers: GravelBags.it  This test is not yet approved or cleared by the [place] FDA and  has been authorized for detection and/or diagnosis of SARS-CoV-2 by  FDA under an Emergency Use Authorization (EUA). This EUA will remain  in effect (meaning this test can be used) for the duration of the  Covid-19 declaration under Section 564(b)(1) of the Act, 21  U.S.C. section  360bbb-3(b)(1), unless the authorization is  terminated or revoked. Performed at [facility], [place] [address], [place], [place] [address]      Time coordinating discharge:  I have spent 35 minutes face to face with the patient and on the ward discussing the patients care, assessment, plan and disposition with other care givers. >50% of the time was devoted counseling the patient about the risks and benefits of treatment/Discharge disposition and coordinating care.   SIGNED:   Damita Lack, MD  [facility] 06/22/2020, 10:58 AM   If 7PM-7AM, please contact night-coverage

## 2020-06-25 ENCOUNTER — Telehealth: Payer: Self-pay

## 2020-06-25 NOTE — Telephone Encounter (Signed)
TCM call attempted. 1st attempt. Left message for pt to call back.

## 2020-06-26 ENCOUNTER — Telehealth: Payer: Self-pay

## 2020-06-26 ENCOUNTER — Telehealth: Payer: Self-pay | Admitting: Cardiology

## 2020-06-26 NOTE — Telephone Encounter (Signed)
Patient is returning the call, please advise. CB is [phone].

## 2020-06-26 NOTE — Telephone Encounter (Signed)
lvm for patient to return call to get follow up scheduled with Christopher from recall list 

## 2020-06-26 NOTE — Telephone Encounter (Signed)
Transition Care Management Follow-up Telephone Call  Date of discharge and from where: 06/22/2020-[place]  How have you been since you were released from the hospital? Good  Any questions or concerns? No  Items Reviewed:  Did the pt receive and understand the discharge instructions provided? Yes   Medications obtained and verified? Yes   Any new allergies since your discharge? No   Dietary orders reviewed? Yes  Do you have support at home? Yes   Functional Questionnaire: (I = Independent and D = Dependent) ADLs: I  Bathing/Dressing- I  Meal Prep- I  Eating- i  Maintaining continence- i  Transferring/Ambulation- i  Managing Meds- i  Follow up appointments reviewed:   PCP Hospital f/u appt confirmed? Yes  Scheduled to see Dr Doreene Burke on 07/02/20 @ 11:30AM.  Specialist Hospital f/u appt confirmed? No  Patient plans to call tomorrow  Are transportation arrangements needed? No   If their condition worsens, is the pt aware to call PCP or go to the Emergency Dept.? Yes  Was the patient provided with contact information for the PCP's office or ED? Yes  Was to pt encouraged to call back with questions or concerns? Yes

## 2020-06-26 NOTE — Telephone Encounter (Signed)
Transition Care Management Unsuccessful Follow-up Telephone Call  Date of discharge and from where:  06/22/20-[place]  Attempts:  3rd Attempt  Reason for unsuccessful TCM follow-up call:  Left voice message

## 2020-06-27 ENCOUNTER — Telehealth: Payer: Self-pay | Admitting: Cardiology

## 2020-06-27 NOTE — Telephone Encounter (Signed)
Add on for tomorrow at 8:20am with Dr. Cristal Deer

## 2020-06-28 ENCOUNTER — Encounter: Payer: Self-pay | Admitting: Cardiology

## 2020-06-28 ENCOUNTER — Other Ambulatory Visit: Payer: Self-pay

## 2020-06-28 ENCOUNTER — Ambulatory Visit (INDEPENDENT_AMBULATORY_CARE_PROVIDER_SITE_OTHER): Payer: Medicare Other | Admitting: Cardiology

## 2020-06-28 ENCOUNTER — Other Ambulatory Visit: Payer: Self-pay | Admitting: *Deleted

## 2020-06-28 VITALS — BP 166/85 | HR 61 | Ht 72.0 in | Wt 188.0 lb

## 2020-06-28 DIAGNOSIS — G4733 Obstructive sleep apnea (adult) (pediatric): Secondary | ICD-10-CM | POA: Diagnosis not present

## 2020-06-28 DIAGNOSIS — I5043 Acute on chronic combined systolic (congestive) and diastolic (congestive) heart failure: Secondary | ICD-10-CM | POA: Diagnosis not present

## 2020-06-28 DIAGNOSIS — I480 Paroxysmal atrial fibrillation: Secondary | ICD-10-CM | POA: Diagnosis not present

## 2020-06-28 DIAGNOSIS — I10 Essential (primary) hypertension: Secondary | ICD-10-CM

## 2020-06-28 DIAGNOSIS — R0789 Other chest pain: Secondary | ICD-10-CM

## 2020-06-28 DIAGNOSIS — J449 Chronic obstructive pulmonary disease, unspecified: Secondary | ICD-10-CM

## 2020-06-28 DIAGNOSIS — Z09 Encounter for follow-up examination after completed treatment for conditions other than malignant neoplasm: Secondary | ICD-10-CM

## 2020-06-28 DIAGNOSIS — E1140 Type 2 diabetes mellitus with diabetic neuropathy, unspecified: Secondary | ICD-10-CM

## 2020-06-28 MED ORDER — ISOSORBIDE MONONITRATE ER 60 MG PO TB24: 60.0000 mg | ORAL_TABLET | Freq: Every day | ORAL | Status: DC

## 2020-06-28 MED ORDER — FUROSEMIDE 40 MG PO TABS: 40.0000 mg | ORAL_TABLET | Freq: Two times a day (BID) | ORAL | Status: DC

## 2020-06-28 NOTE — Patient Instructions (Addendum)
Medication Instructions:  Take 40 mg (2 of the 20 mg tabs) of furosemide twice a day for a week. Increase isosorbide (imdur) to 60 mg (2 tabs) once a day.  Continue to check blood pressure at home.  *If you need a refill on your cardiac medications before your next appointment, please call your pharmacy*   Lab Work: None ordered   Testing/Procedures: None ordered    Follow-Up: At [facility], you and your health needs are our priority.  As part of our continuing mission to provide you with exceptional heart care, we have created designated Provider Care Teams.  These Care Teams include your primary Cardiologist (physician) and Advanced Practice Providers (APPs -  Physician Assistants and Nurse Practitioners) who all work together to provide you with the care you need, when you need it.  We recommend signing up for the patient portal called "MyChart".  Sign up information is provided on this After Visit Summary.  MyChart is used to connect with patients for Virtual Visits (Telemedicine).  Patients are able to view lab/test results, encounter notes, upcoming appointments, etc.  Non-urgent messages can be sent to your provider as well.   To learn more about what you can do with MyChart, go to [email or web address].    Your next appointment:   1 week(s)  The format for your next appointment:   In Person  Provider:   Jodelle Red, MD

## 2020-06-28 NOTE — Progress Notes (Signed)
Cardiology Office Note:    Date:  06/28/2020   ID:  Kenneth [name], DOB Nov 27, 1960, MRN [id number]  PCP:  Kenneth Maw, MD  Cardiologist:  Kenneth Dresser, MD PhD  Referring MD: Kenneth [name],*   CC: follow up  History of Present Illness:    Kenneth [name] is a 59 y.o. male with a hx of hypertension, diabetes, tobacco use, non-ischemic cardiomyopathy with EF 30-35%, COPD who is seen for follow up today. I initially saw him 10/21/18 as a new patient to me at the request of Kenneth [name],* for the evaluation and management of paroxysmal atrial fibrillation.  Cardiac history: he followed with a cardiologist in [place], then in [place], but he recently moved back to the area. Per care everywhere notes: history of angioedema on ACEi, was on losartan and tolerating in 2016; however in [place] he was not on ARB but hydralazine instead. Cath/echo in 2011 showed normal coronaries, reduced LV function with EF 40%. He was admitted for COPD and HF exacerbation in 11/2017 in [facility], and at that time he was on pradaxa for anticoagulation. He was seen by Kenneth [name] in the afib clinic in 07/2018, started on apixaban and carvedilol uptitrated. Echo repeated 08/2018, below.   Today: Seen for transition of care appointment post discharge. Hospital course including discharge summary from 06/22/20 reviewed today. Full medication reconciliation done today.  He presented to ER 06/20/20 with shortness of breath and was admitted through 06/22/20. He was treated with diuretics, bronchodilators, and steroids with improvement. Felt to be predominantly COPD exacerbation.  Today noted a numbness/pressure in his chest. Nonexertional. Happening 2-3 times/weeks in the morning, improved with nitro this AM. They were occurring periodically even before he went to the ER 9/29. hsTn negative during that evaluation. Feels very stressed/tense given his breathing, wants to know what is going on. Feels  better with O2 and CPAP. Trialing PPI to see if this helps as well.   Has taken medications this AM. Feels like his weight has fluctuated, up to 195 lbs, prior had been 173-176 lbs. Urinated a lot in the hospital, but has less urination since being home. Taking furosemide 20 mg daily. Using inhaler several times/day, helps significantly. Can walk, but gets fatigued easily. Tapering off prednisone.  Blood pressures at home have been 048-889 systolic. No low numbers, lowest 169 systolic.  Denies PND, orthopnea, LE edema or unexpected weight gain. No syncope or palpitations.  Past Medical History:  Diagnosis Date  . Anxiety   . Arthritis   . Asthma   . Atrial fibrillation ([place])   . CHF (congestive heart failure) ([place])   . COPD (chronic obstructive pulmonary disease) ([place])   . Depression   . Diabetes mellitus without complication ([place])   . Emphysema of lung ([place])   . Heart murmur   . Hyperlipidemia   . Hypertension   . Sleep apnea with use of continuous positive airway pressure (CPAP)     Past Surgical History:  Procedure Laterality Date  . NO PAST SURGERIES      Current Medications: Current Outpatient Medications on File Prior to Visit  Medication Sig  . acetaminophen (TYLENOL) 325 MG tablet Take 650 mg by mouth every 6 (six) hours as needed for mild pain or headache.  . albuterol (PROVENTIL HFA;VENTOLIN HFA) 108 (90 Base) MCG/ACT inhaler Inhale 1-2 puffs into the lungs every 4 (four) hours as needed for wheezing or shortness of breath.   [name] albuterol (PROVENTIL) (2.5 MG/3ML) 0.083% nebulizer solution  INHALE 1 VIAL VIA NEBULIZER 5 TIMES DAILY AS NEEDED FOR WHEEZING OR FOR SHORTNESS OF BREATH (Patient taking differently: Take 2.5 mg by nebulization 5 (five) times daily as needed for wheezing or shortness of breath. )  . amLODipine (NORVASC) 5 MG tablet Take 1 tablet (5 mg total) by mouth daily.  [name] apixaban (ELIQUIS) 5 MG TABS tablet Take 1 tablet (5 mg total) by mouth 2 (two) times  daily.  . Blood Glucose Monitoring Suppl (ONE TOUCH ULTRA 2) w/Device KIT Use to test blood sugars 1-2 times daily.  . busPIRone (BUSPAR) 15 MG tablet Take 1 tablet (15 mg total) by mouth 2 (two) times daily.  . carvedilol (COREG) 12.5 MG tablet Take 1 tablet (12.5 mg total) by mouth 2 (two) times daily with a meal.  . famotidine (PEPCID) 20 MG tablet TAKE 1 TABLET BY MOUTH  DAILY  . FLUoxetine (PROZAC) 20 MG capsule TAKE 3 CAPSULES BY MOUTH  DAILY (Patient taking differently: Take 60 mg by mouth daily. )  . Fluticasone-Umeclidin-Vilant (TRELEGY ELLIPTA) 100-62.5-25 MCG/INH AEPB Inhale 1 puff into the lungs daily.  [name] gabapentin (NEURONTIN) 300 MG capsule Take 1 capsule (300 mg total) by mouth 3 (three) times daily.  [name] glucose blood (ONETOUCH ULTRA) test strip USE TO TEST BLOOD SUGAR 1-2 TIMES DAILY  . ipratropium (ATROVENT) 0.02 % nebulizer solution Take 2.5 mLs (0.5 mg total) by nebulization 4 (four) times daily.  . Lancets (ONETOUCH ULTRASOFT) lancets USE TO TEST BLOOD SUGAR 1-2 TIMES DAILY  . losartan (COZAAR) 25 MG tablet Take 25 mg by mouth daily.  [name] lubiprostone ([name]) 8 MCG capsule TAKE 1 CAPSULE(8 MCG) BY MOUTH TWICE DAILY WITH A MEAL (Patient taking differently: Take 8 mcg by mouth 2 (two) times daily with a meal. )  . metFORMIN (GLUCOPHAGE) 500 MG tablet TAKE 1 TABLET BY MOUTH  TWICE DAILY WITH MEALS (Patient taking differently: Take 500 mg by mouth 2 (two) times daily with a meal. )  . Multiple Vitamin (MULTIVITAMIN WITH MINERALS) TABS tablet Take 1 tablet by mouth daily.  . nitroGLYCERIN (NITROSTAT) 0.4 MG SL tablet DISSOLVE 1 TABLET UNDER THE TONGUE EVERY 5 MINUTES AS  NEEDED FOR CHEST PAIN. MAX  OF 3 TABLETS IN 15 MINUTES. CALL 911 IF PAIN PERSISTS. (Patient taking differently: Place 0.4 mg under the tongue every 5 (five) minutes x 3 doses as needed for chest pain. )  . pantoprazole (PROTONIX) 20 MG tablet Take 20 mg by mouth daily.  . traZODone (DESYREL) 50 MG tablet TAKE 1/2 TO 1  TABLET BY  MOUTH AT BEDTIME AS NEEDED  FOR SLEEP.   No current facility-administered medications on file prior to visit.     Allergies:   Lisinopril, Other, and Tomato   Social History   Tobacco Use  . Smoking status: Former Smoker    Packs/day: 2.00    Years: 30.00    Pack years: 60.00    Types: Cigarettes    Quit date: 05/01/2019    Years since quitting: 1.1  . Smokeless tobacco: Never Used  . Tobacco comment: smokes 2-3cigs per day  Vaping Use  . Vaping Use: Never used  Substance Use Topics  . Alcohol use: Yes    Comment: Drinks up to six beers around a game  . Drug use: Yes    Types: Marijuana    Comment: occ marijuana    Family History: The patient's family history includes Coronary artery disease in his brother; Diabetes in his father, mother, and sister;  Heart disease in his father, mother, and sister; Kidney disease in his sister; Liver disease in his maternal uncle. Father also had unknown heart disease. Mother had cancer.   ROS:   Please see the history of present illness.  Additional pertinent ROS otherwise unremarkable.  EKGs/Labs/Other Studies Reviewed:    The following studies were reviewed today: Echo 04/07/20 1. LVEF is mildly depressed with inferior hypokinesis. Compared to  previous echo, LVEF is improved. . Left ventricular ejection fraction, by  estimation, is 45 to 50%. The left ventricle has mildly decreased  function. The left ventricle demonstrates  regional wall motion abnormalities (see scoring diagram/findings for  description). The left ventricular internal cavity size was mildly  dilated. Indeterminate diastolic filling due to E-A fusion.  2. Right ventricular systolic function is normal. The right ventricular  size is normal. There is normal pulmonary artery systolic pressure.  3. The mitral valve is abnormal. Trivial mitral valve regurgitation.  4. The aortic valve is abnormal. Aortic valve regurgitation is mild. Mild  to moderate  aortic valve sclerosis/calcification is present, without any  evidence of aortic stenosis.  5. The inferior vena cava is normal in size with greater than 50%  respiratory variability, suggesting right atrial pressure of 3 mmHg.   Echo 06/13/19 1. Left ventricular ejection fraction, by visual estimation, is 30 to  35%. The left ventricle has normal function. Normal left ventricular size.  There is moderately increased left ventricular hypertrophy.  2. Basal and mid inferolateral wall is abnormal.  3. Left ventricular diastolic Doppler parameters are indeterminate  pattern of LV diastolic filling.  4. Global right ventricle has normal systolic function.The right  ventricular size is normal. No increase in right ventricular wall  thickness.  5. Left atrial size was normal.  6. Right atrial size was normal.  7. The mitral valve is normal in structure. Trace mitral valve  regurgitation. No evidence of mitral stenosis.  8. The tricuspid valve is normal in structure. Tricuspid valve  regurgitation is trivial.  9. The aortic valve is normal in structure. Aortic valve regurgitation is  mild to moderate by color flow Doppler. Mild aortic valve sclerosis  without stenosis.  10. The pulmonic valve was normal in structure. Pulmonic valve  regurgitation is not visualized by color flow Doppler.  11. The inferior vena cava is normal in size with greater than 50%  respiratory variability, suggesting right atrial pressure of 3 mmHg.   Echo 09/21/18 Left ventricle: The cavity size was normal. Wall thickness was   normal. Systolic function was moderately to severely reduced. The   estimated ejection fraction was in the range of 30% to 35%.   Diffuse hypokinesis. There is hypokinesis of the inferior and   inferoseptal myocardium. Features are consistent with a   pseudonormal left ventricular filling pattern, with concomitant   abnormal relaxation and increased filling pressure (grade 2    diastolic dysfunction). - Aortic valve: Trileaflet; moderately thickened, moderately   calcified leaflets. There was mild regurgitation. - Left atrium: The atrium was moderately dilated. - Tricuspid valve: There was trivial regurgitation. - Pulmonary arteries: Systolic pressure was mildly increased. PA   peak pressure: 32 mm Hg (S).  EKG:  EKG is personally reviewed.  The ekg ordered today demonstrates sinus bradycardia, LVH with repol changes, similar to prior to admission ECG  Recent Labs: 06/17/2020: ALT 17 06/20/2020: B Natriuretic Peptide 1,026.0 06/22/2020: BUN 24; Creatinine, Ser 1.34; Hemoglobin 11.4; Magnesium 2.1; Platelets 230; Potassium 4.5; Sodium 135  Recent  Lipid Panel    Component Value Date/Time   CHOL 226 (H) 07/06/2018 1355   TRIG 190.0 (H) 07/06/2018 1355   HDL 68.40 07/06/2018 1355   CHOLHDL 3 07/06/2018 1355   VLDL 38.0 07/06/2018 1355   LDLCALC 119 (H) 07/06/2018 1355    Physical Exam:    VS:  BP (!) 166/85   Pulse 61   Ht 6' (1.829 m)   Wt 188 lb (85.3 kg)   SpO2 97%   BMI 25.50 kg/m     Wt Readings from Last 3 Encounters:  06/28/20 188 lb (85.3 kg)  06/21/20 180 lb 12.4 oz (82 kg)  06/01/20 185 lb 12.8 oz (84.3 kg)    GEN: Well nourished, well developed in no acute distress HEENT: Normal, moist mucous membranes NECK: JVD just at clavicle sitting upright CARDIAC: regular rhythm, normal S1 and S2, no rubs or gallops. No murmur. VASCULAR: Radial and DP pulses 2+ bilaterally. No carotid bruits RESPIRATORY:  Distant throughout, no appreciable crackles on wheezing on exam. ABDOMEN: Soft, non-tender, non-distended MUSCULOSKELETAL:  Ambulates independently SKIN: Warm and dry, no edema NEUROLOGIC:  Alert and oriented x 3. No focal neuro deficits noted. PSYCHIATRIC:  Normal affect   ASSESSMENT:    1. Acute on chronic combined systolic and diastolic CHF (congestive heart failure) (HCC)   2. Chest pressure   3. PAF (paroxysmal atrial fibrillation)  ([place])   4. Essential hypertension   5. Obstructive sleep apnea   6. Chronic obstructive pulmonary disease, unspecified COPD type ([place])   7. Type 2 diabetes mellitus with diabetic neuropathy, without long-term current use of insulin ([place])   8. Hospital discharge follow-up    PLAN:    Acute on chronic systolic and diastolic heart failure: weight is up, mild JVD elevation. Still feels short of breath, with intermittent chest pressure.  -will increase lasix to 40 mg BID for one week, then likely 40 mg daily -EF improved on most recent echo to 45-50% -tolerating carvedilol 12.5 mg BID, watch for bradycardia -had angioedema on ACEi before. Tolerating losartan 50 mg daily, will continue. Have discussed entresto, but given increased risk of angioedema on this, will continue ARB. -continue daily weight, salt avoidance, fluid restriction -counseled on red flag warning signs, including chest pain/pressure, that need immediate medical attention -transition of care visit today, as he was discharged 6 days ago, but he does not seem at his baseline. High risk for recurrent admission. Will follow up closely. -ECG nonacute today, recent troponins unremarkable. We discussed ER today given his chest pressure, he declines at this time  Paroxysmal atrial fibrillation -CHA2DS2/VAS Stroke Risk Points=3 -continue apixaban, denies bleeding issues  Hypertension: -remains elevated -increasing furosemide as above -given nitro responsiveness of chest pressure, will increase imdur from 30 mg to 60 mg today -continue carvedilol, losartan, amlodipine  Sleep apnea: -has CPAP  Severe COPD: -followed by Dr. Lamonte Sakai, multiple recent admissions for COPD exacerbation -continue tobacco abstinence  Type II diabetes, with chronic systolic and diastolic dysfunction: -on metformin -have discussed SGLT2i, but cost is a concern for him. -we have previously discussed recommendation for statin given his risk and diabetes -no  longer on aspirin as he is on apixaban  Prevention and risk counseling -recommend heart healthy/Mediterranean diet, with whole grains, fruits, vegetable, fish, lean meats, nuts, and olive oil. Limit salt. -recommend moderate walking, 3-5 times/week for 30-50 minutes each session. Aim for at least 150 minutes.week. Goal should be pace of 3 miles/hours, or walking 1.5 miles in 30 minutes -  recommend avoidance of tobacco products. Avoid excess alcohol. -ASCVD risk score: no known ASCVD The 10-year ASCVD risk score Mikey Bussing DC Jr., et al., 2013) is: 49.2%   Values used to calculate the score:     Age: 14 years     Sex: Male     Is Non-Hispanic African American: Yes     Diabetic: Yes     Tobacco smoker: Yes     Systolic Blood Pressure: 701 mmHg     Is BP treated: Yes     HDL Cholesterol: 68.4 mg/dL     Total Cholesterol: 226 mg/dL   Plan for follow up: 1 week, high risk for readmission  Kenneth Dresser, MD, PhD [place]  [facility]   Medication Adjustments/Labs and Tests Ordered: Current medicines are reviewed at length with the patient today.  Concerns regarding medicines are outlined above.  Orders Placed This Encounter  Procedures  . EKG 12-Lead   Meds ordered this encounter  Medications  . isosorbide mononitrate (IMDUR) 60 MG 24 hr tablet    Sig: Take 1 tablet (60 mg total) by mouth daily.  . furosemide (LASIX) 40 MG tablet    Sig: Take 1 tablet (40 mg total) by mouth 2 (two) times daily.    Patient Instructions  Medication Instructions:  Take 40 mg (2 of the 20 mg tabs) of furosemide twice a day for a week. Increase isosorbide (imdur) to 60 mg (2 tabs) once a day.  Continue to check blood pressure at home.  *If you need a refill on your cardiac medications before your next appointment, please call your pharmacy*   Lab Work: None ordered   Testing/Procedures: None ordered    Follow-Up: At [facility], you and your health needs are our priority.   As part of our continuing mission to provide you with exceptional heart care, we have created designated Provider Care Teams.  These Care Teams include your primary Cardiologist (physician) and Advanced Practice Providers (APPs -  Physician Assistants and Nurse Practitioners) who all work together to provide you with the care you need, when you need it.  We recommend signing up for the patient portal called "MyChart".  Sign up information is provided on this After Visit Summary.  MyChart is used to connect with patients for Virtual Visits (Telemedicine).  Patients are able to view lab/test results, encounter notes, upcoming appointments, etc.  Non-urgent messages can be sent to your provider as well.   To learn more about what you can do with MyChart, go to [email or web address].    Your next appointment:   1 week(s)  The format for your next appointment:   In Person  Provider:   Buford Dresser, MD     Signed, Kenneth Dresser, MD PhD 06/28/2020 1:48 PM    [place]

## 2020-06-28 NOTE — Patient Outreach (Signed)
[facility]) Care Management  06/28/2020  Kenneth [name] Dec 04, 1960 [id number]   RED ON EMMI ALERT - General Discharge Day # 4 Date: 10/6 Red Alert Reason: Feeling sad/hopeless/empty/anxious   Noted that member had been in hospital 9/29-10/1 for COPD exacerbation, triggered red due to above EMMI alert.  Call placed to member, he report feeling better since being home.  State he was seen at cardiology office today, medications (Lasix and Imdur) increased, will have follow up on 10/14.  Also has follow up appointments with PCP on 10/11 and with pulmonology on 10/21.  He is not currently having any shortness of breath but state he has been having anxiety, anticipating a flare up.  Oxygen levels have reportedly been greater than 92% when monitoring.  He has been taking Fluoxetine as well as Buspar, state he may need something different or need to speak with a counselor/psychiatrist.  He is open to having CSW contact him for assessment and counseling.     Plan: RN CM will place referral to CSW, will follow up with member within the next month.  Goals Addressed              This Visit's Progress   .  COMPLETED: [facility] - I want to get my breathing better and stay out of the hospital (pt-stated)        CARE PLAN ENTRY (see longtitudinal plan of care for additional care plan information)  Current Barriers:  [name] Knowledge deficits related to basic understanding of COPD disease process . Knowledge deficits related to basic COPD self care/management   Case Manager Clinical Goal(s):  Over the next 28 days, patient will be able to verbalize understanding of COPD action plan and when to seek appropriate levels of medical care  Over the next 28 days, patient will engage in lite exercise as tolerated to build/regain stamina and strength and reduce shortness of breath through activity tolerance  Over the next 31 days, patient will not be hospitalized for COPD exacerbation     Interventions:   Provided patient with basic written and verbal COPD education on self care/management/and exacerbation prevention   Advised patient to self assesses COPD action plan zone and make appointment with provider if in the yellow zone for 48 hours without improvement.  Provided patient with education about the role of exercise in the management of COPD  Advised patient to engage in light exercise as tolerated 3-5 days a week  Patient Self Care Activities:  . Takes medications as prescribed including inhalers . Self assesses COPD action plan zone and makes appointment with provider if in the yellow zone for 48 hours without improvement. . Utilizes infection prevention strategies to reduce risk of respiratory infection  . Does not contact provider office for questions/concerns  UPDATE:  has remained out of the hospital, continue to work on overall management of COPD and obtaining O2.   UPDATE 10/7 - Resolving due to duplicate goal     .  [facility] - Track and Manage My Symptoms        Follow Up Date 11/5   - develop a rescue plan - keep follow-up appointments    Why is this important?   Tracking your symptoms and other information about your health helps your doctor plan your care.  Write down the symptoms, the time of day, what you were doing and what medicine you are taking.  You will soon learn how to manage your symptoms.     Notes:     .  [facility] - Track and Manage My Triggers        Follow Up Date 11/5   - eliminate smoking in my home - identify and remove indoor air pollutants - listen for public air quality announcements every day    Why is this important?   Triggers are activities or things, like tobacco smoke or cold weather, that make your COPD (chronic obstructive pulmonary disease) flare-up.  Knowing these triggers helps you plan how to stay away from them.  When you cannot remove them, you can learn how to manage them.     Notes:       [name], RN,  MSN [facility] Care Management  [facility] Manager [phone]

## 2020-07-02 ENCOUNTER — Telehealth: Payer: Self-pay | Admitting: Family Medicine

## 2020-07-02 ENCOUNTER — Encounter: Payer: Self-pay | Admitting: Family Medicine

## 2020-07-02 ENCOUNTER — Ambulatory Visit (INDEPENDENT_AMBULATORY_CARE_PROVIDER_SITE_OTHER): Payer: Medicare Other | Admitting: Family Medicine

## 2020-07-02 ENCOUNTER — Other Ambulatory Visit: Payer: Self-pay

## 2020-07-02 VITALS — BP 138/80 | HR 60 | Temp 97.0°F | Ht 72.0 in | Wt 190.0 lb

## 2020-07-02 DIAGNOSIS — F418 Other specified anxiety disorders: Secondary | ICD-10-CM | POA: Diagnosis not present

## 2020-07-02 DIAGNOSIS — Z0000 Encounter for general adult medical examination without abnormal findings: Secondary | ICD-10-CM

## 2020-07-02 DIAGNOSIS — I10 Essential (primary) hypertension: Secondary | ICD-10-CM | POA: Diagnosis not present

## 2020-07-02 DIAGNOSIS — E119 Type 2 diabetes mellitus without complications: Secondary | ICD-10-CM

## 2020-07-02 DIAGNOSIS — E519 Thiamine deficiency, unspecified: Secondary | ICD-10-CM | POA: Insufficient documentation

## 2020-07-02 DIAGNOSIS — D539 Nutritional anemia, unspecified: Secondary | ICD-10-CM | POA: Diagnosis not present

## 2020-07-02 LAB — PSA: PSA: 0.66 ng/mL (ref 0.10–4.00)

## 2020-07-02 NOTE — Progress Notes (Signed)
Established Patient Office Visit  Subjective:  Patient ID: Kenneth [name], male    DOB: [birth year]  Age: 59 y.o. MRN: [id number]  CC:  Chief Complaint  Patient presents with  . Hospitalization Follow-up    hospital follow up, would like a order for portable 02 tank     HPI Kenneth [name] presents for follow-up of depression with anxiety, alcohol abuse, hypertension, thiamine deficiency and health maintenance.  Patient remains tobacco free but does smoke marijuana on occasion.  Recent hospitalization for COPD exacerbation.  More recently has moderated his alcohol intake he tells me.  Remains compliant with his Prozac and BuSpar.  Diabetes controlled with recent hemoglobin A1c of 6.8.  Denies issue with urine flow frequency urgency.  He is moving his bowels regularly.  There has been no blood in his stool.  Nonfasting today.  Past Medical History:  Diagnosis Date  . Anxiety   . Arthritis   . Asthma   . Atrial fibrillation ([name])   . CHF (congestive heart failure) ([name])   . COPD (chronic obstructive pulmonary disease) ([name])   . Depression   . Diabetes mellitus without complication ([name])   . Emphysema of lung ([name])   . Heart murmur   . Hyperlipidemia   . Hypertension   . Sleep apnea with use of continuous positive airway pressure (CPAP)     Past Surgical History:  Procedure Laterality Date  . NO PAST SURGERIES      Family History  Problem Relation Age of Onset  . Diabetes Mother   . Heart disease Mother   . Diabetes Father   . Heart disease Father   . Diabetes Sister   . Heart disease Sister   . Kidney disease Sister   . Coronary artery disease Brother   . Liver disease Maternal Uncle     Social History   Socioeconomic History  . Marital status: Legally Separated    Spouse name: Not on file  . Number of children: 1  . Years of education: Not on file  . Highest education level: Not on file  Occupational History  . Occupation: disabled  Tobacco Use  . Smoking  status: Former Smoker    Packs/day: 2.00    Years: 30.00    Pack years: 60.00    Types: Cigarettes    Quit date: 05/01/2019    Years since quitting: 1.1  . Smokeless tobacco: Never Used  . Tobacco comment: smokes 2-3cigs per day  Vaping Use  . Vaping Use: Never used  Substance and Sexual Activity  . Alcohol use: Yes    Comment: Drinks up to six beers around a game  . Drug use: Yes    Types: Marijuana    Comment: occ marijuana  . Sexual activity: Yes    Partners: Female  Other Topics Concern  . Not on file  Social History Narrative  . Not on file   Social Determinants of Health   Financial Resource Strain: Low Risk   . Difficulty of Paying Living Expenses: Not hard at all  Food Insecurity: No Food Insecurity  . Worried About Charity fundraiser in the Last Year: Never true  . Ran Out of Food in the Last Year: Never true  Transportation Needs: No Transportation Needs  . Lack of Transportation (Medical): No  . Lack of Transportation (Non-Medical): No  Physical Activity:   . Days of Exercise per Week: Not on file  . Minutes of Exercise per Session: Not on file  Stress:   . Feeling of Stress : Not on file  Social Connections:   . Frequency of Communication with Friends and Family: Not on file  . Frequency of Social Gatherings with Friends and Family: Not on file  . Attends Religious Services: Not on file  . Active Member of Clubs or Organizations: Not on file  . Attends Archivist Meetings: Not on file  . Marital Status: Not on file  Intimate Partner Violence:   . Fear of Current or Ex-Partner: Not on file  . Emotionally Abused: Not on file  . Physically Abused: Not on file  . Sexually Abused: Not on file    Outpatient Medications Prior to Visit  Medication Sig Dispense Refill  . acetaminophen (TYLENOL) 325 MG tablet Take 650 mg by mouth every 6 (six) hours as needed for mild pain or headache.    . albuterol (PROVENTIL HFA;VENTOLIN HFA) 108 (90 Base) MCG/ACT  inhaler Inhale 1-2 puffs into the lungs every 4 (four) hours as needed for wheezing or shortness of breath.     [name] albuterol (PROVENTIL) (2.5 MG/3ML) 0.083% nebulizer solution INHALE 1 VIAL VIA NEBULIZER 5 TIMES DAILY AS NEEDED FOR WHEEZING OR FOR SHORTNESS OF BREATH (Patient taking differently: Take 2.5 mg by nebulization 5 (five) times daily as needed for wheezing or shortness of breath. ) 150 mL 1  . amLODipine (NORVASC) 5 MG tablet Take 1 tablet (5 mg total) by mouth daily. 90 tablet 3  . apixaban (ELIQUIS) 5 MG TABS tablet Take 1 tablet (5 mg total) by mouth 2 (two) times daily. 180 tablet 1  . Blood Glucose Monitoring Suppl (ONE TOUCH ULTRA 2) w/Device KIT Use to test blood sugars 1-2 times daily. 1 kit 0  . busPIRone (BUSPAR) 15 MG tablet Take 1 tablet (15 mg total) by mouth 2 (two) times daily. 180 tablet 1  . carvedilol (COREG) 12.5 MG tablet Take 1 tablet (12.5 mg total) by mouth 2 (two) times daily with a meal. 180 tablet 3  . famotidine (PEPCID) 20 MG tablet TAKE 1 TABLET BY MOUTH  DAILY 90 tablet 3  . FLUoxetine (PROZAC) 20 MG capsule TAKE 3 CAPSULES BY MOUTH  DAILY (Patient taking differently: Take 60 mg by mouth daily. ) 270 capsule 3  . Fluticasone-Umeclidin-Vilant (TRELEGY ELLIPTA) 100-62.5-25 MCG/INH AEPB Inhale 1 puff into the lungs daily. 60 each 5  . furosemide (LASIX) 40 MG tablet Take 1 tablet (40 mg total) by mouth 2 (two) times daily.    [name] gabapentin (NEURONTIN) 300 MG capsule Take 1 capsule (300 mg total) by mouth 3 (three) times daily. 90 capsule 3  . glucose blood (ONETOUCH ULTRA) test strip USE TO TEST BLOOD SUGAR 1-2 TIMES DAILY 100 each 11  . ipratropium (ATROVENT) 0.02 % nebulizer solution Take 2.5 mLs (0.5 mg total) by nebulization 4 (four) times daily. 75 mL 12  . isosorbide mononitrate (IMDUR) 60 MG 24 hr tablet Take 1 tablet (60 mg total) by mouth daily.    . Lancets (ONETOUCH ULTRASOFT) lancets USE TO TEST BLOOD SUGAR 1-2 TIMES DAILY 100 each 11  . losartan  (COZAAR) 50 MG tablet Take 50 mg by mouth daily.    [name] lubiprostone ([name]) 8 MCG capsule TAKE 1 CAPSULE(8 MCG) BY MOUTH TWICE DAILY WITH A MEAL (Patient taking differently: Take 8 mcg by mouth 2 (two) times daily with a meal. ) 60 capsule 8  . metFORMIN (GLUCOPHAGE) 500 MG tablet TAKE 1 TABLET BY MOUTH  TWICE DAILY WITH MEALS (  Patient taking differently: Take 500 mg by mouth 2 (two) times daily with a meal. ) 180 tablet 3  . Multiple Vitamin (MULTIVITAMIN WITH MINERALS) TABS tablet Take 1 tablet by mouth daily.    . nitroGLYCERIN (NITROSTAT) 0.4 MG SL tablet DISSOLVE 1 TABLET UNDER THE TONGUE EVERY 5 MINUTES AS  NEEDED FOR CHEST PAIN. MAX  OF 3 TABLETS IN 15 MINUTES. CALL 911 IF PAIN PERSISTS. (Patient taking differently: Place 0.4 mg under the tongue every 5 (five) minutes x 3 doses as needed for chest pain. ) 75 tablet 4  . pantoprazole (PROTONIX) 20 MG tablet Take 20 mg by mouth daily.    . traZODone (DESYREL) 50 MG tablet TAKE 1/2 TO 1 TABLET BY  MOUTH AT BEDTIME AS NEEDED  FOR SLEEP. 30 tablet 11  . losartan (COZAAR) 25 MG tablet Take 25 mg by mouth daily. (Patient not taking: Reported on 07/02/2020)     No facility-administered medications prior to visit.    Allergies  Allergen Reactions  . Lisinopril Swelling  . Other Other (See Comments)    Lettuce : rash  . Tomato Rash    ROS Review of Systems  Constitutional: Negative.   HENT: Negative.   Eyes: Negative for photophobia and visual disturbance.  Respiratory: Positive for shortness of breath. Negative for chest tightness.   Cardiovascular: Negative for chest pain.  Gastrointestinal: Negative for anal bleeding, blood in stool, constipation and diarrhea.  Endocrine: Negative for polyphagia and polyuria.  Genitourinary: Negative for difficulty urinating, frequency and urgency.  Musculoskeletal: Negative for gait problem and joint swelling.  Skin: Negative for pallor and rash.  Allergic/Immunologic: Negative for immunocompromised  state.  Neurological: Negative for speech difficulty and light-headedness.  Hematological: Does not bruise/bleed easily.   Depression screen [facility] 2/9 07/02/2020 04/24/2020 03/13/2020  Decreased Interest 0 0 3  Down, Depressed, Hopeless 0 0 2  PHQ - 2 Score 0 0 5  Altered sleeping - - 2  Tired, decreased energy - - 3  Change in appetite - - 1  Feeling bad or failure about yourself  - - 0  Trouble concentrating - - 3  Moving slowly or fidgety/restless - - 3  Suicidal thoughts - - 0  PHQ-9 Score - - 17  Difficult doing work/chores - - Very difficult      Objective:    Physical Exam Vitals and nursing note reviewed.  Constitutional:      General: He is not in acute distress.    Appearance: Normal appearance. He is not ill-appearing, toxic-appearing or diaphoretic.  HENT:     Head: Normocephalic and atraumatic.     Right Ear: External ear normal.     Left Ear: External ear normal.  Eyes:     General: No scleral icterus.       Right eye: No discharge.        Left eye: No discharge.     Extraocular Movements: Extraocular movements intact.     Conjunctiva/sclera: Conjunctivae normal.     Pupils: Pupils are equal, round, and reactive to light.  Cardiovascular:     Rate and Rhythm: Rhythm irregularly irregular.  Pulmonary:     Effort: No respiratory distress.     Breath sounds: Decreased air movement present. Decreased breath sounds present. No wheezing, rhonchi or rales.  Abdominal:     General: Bowel sounds are normal. There is no distension.     Palpations: Abdomen is soft. There is no mass.     Tenderness: There is  no abdominal tenderness. There is no guarding or rebound.     Hernia: No hernia is present.  Genitourinary:    Prostate: Enlarged. Not tender and no nodules present.     Rectum: Guaiac result negative. No mass, tenderness, anal fissure, external hemorrhoid or internal hemorrhoid. Normal anal tone.  Musculoskeletal:     Cervical back: No rigidity or tenderness.    Lymphadenopathy:     Cervical: No cervical adenopathy.  Skin:    General: Skin is warm and dry.  Neurological:     Mental Status: He is alert and oriented to person, place, and time.  Psychiatric:        Mood and Affect: Mood normal.        Behavior: Behavior normal.     BP 138/80   Pulse 60   Temp (!) 97 F (36.1 C) (Tympanic)   Ht 6' (1.829 m)   Wt 190 lb (86.2 kg)   SpO2 95%   BMI 25.77 kg/m  Wt Readings from Last 3 Encounters:  07/02/20 190 lb (86.2 kg)  06/28/20 188 lb (85.3 kg)  06/21/20 180 lb 12.4 oz (82 kg)     Health Maintenance Due  Topic Date Due  . COLONOSCOPY  09/23/2019  . OPHTHALMOLOGY EXAM  05/12/2020    There are no preventive care reminders to display for this patient.  Lab Results  Component Value Date   TSH 4.32 07/06/2018   Lab Results  Component Value Date   WBC 10.7 (H) 06/22/2020   HGB 11.4 (L) 06/22/2020   HCT 36.9 (L) 06/22/2020   MCV 93.4 06/22/2020   PLT 230 06/22/2020   Lab Results  Component Value Date   NA 135 06/22/2020   K 4.5 06/22/2020   CO2 27 06/22/2020   GLUCOSE 259 (H) 06/22/2020   BUN 24 (H) 06/22/2020   CREATININE 1.34 (H) 06/22/2020   BILITOT 0.7 06/17/2020   ALKPHOS 54 06/17/2020   AST 21 06/17/2020   ALT 17 06/17/2020   PROT 6.5 06/17/2020   ALBUMIN 3.7 06/17/2020   CALCIUM 9.3 06/22/2020   ANIONGAP 12 06/22/2020   GFR 88.57 02/14/2020   Lab Results  Component Value Date   CHOL 226 (H) 07/06/2018   Lab Results  Component Value Date   HDL 68.40 07/06/2018   Lab Results  Component Value Date   LDLCALC 119 (H) 07/06/2018   Lab Results  Component Value Date   TRIG 190.0 (H) 07/06/2018   Lab Results  Component Value Date   CHOLHDL 3 07/06/2018   Lab Results  Component Value Date   HGBA1C 6.8 (H) 06/21/2020      Assessment & Plan:   Problem List Items Addressed This Visit      Cardiovascular and Mediastinum   Essential hypertension - Primary   Relevant Medications   losartan  (COZAAR) 50 MG tablet     Endocrine   Controlled type 2 diabetes mellitus without complication, without long-term current use of insulin (HCC)   Relevant Medications   losartan (COZAAR) 50 MG tablet     Other   Macrocytic anemia   Relevant Orders   Vitamin B12   Healthcare maintenance   Relevant Orders   PSA   Thiamine deficiency   Relevant Orders   Vitamin B1    Other Visit Diagnoses    Depression with anxiety          No orders of the defined types were placed in this encounter.   Follow-up: Return in  about 4 months (around 11/02/2020).  Congratulated him on his continued abstinence from tobacco.  Encouraged him to continue his moderation of alcohol.  Continue current medications follow-up in 4 months.  [name], MD

## 2020-07-02 NOTE — Telephone Encounter (Signed)
Patient would like to know what his blood type is. Please call patient and advise.

## 2020-07-03 LAB — VITAMIN B12: Vitamin B-12: 688 pg/mL (ref 211–911)

## 2020-07-03 NOTE — Telephone Encounter (Signed)
Patient verbally understood we do not test for blood type here and the best way for him to find out is to donate blood.

## 2020-07-03 NOTE — Telephone Encounter (Signed)
Patient is calling back to check the status of his previous message. Please call him back at [phone] and advise.

## 2020-07-04 ENCOUNTER — Other Ambulatory Visit: Payer: Self-pay | Admitting: *Deleted

## 2020-07-04 NOTE — Patient Outreach (Signed)
[facility]) Care Management  07/04/2020  Kenneth [name] 1961-01-07 [id number]   CSW attempted initial outreach to pt on 07/03/2020 and was unsuccessful.  No voicemail/no answer.  CSW will mail pt an Unsuccessful outreach letter and attempt again in 3-4 business days per policy.   [name], MSW, LCSW Clinical Social Worker  [facility] [phone]

## 2020-07-05 ENCOUNTER — Encounter: Payer: Self-pay | Admitting: Cardiology

## 2020-07-05 ENCOUNTER — Other Ambulatory Visit: Payer: Self-pay

## 2020-07-05 ENCOUNTER — Ambulatory Visit (INDEPENDENT_AMBULATORY_CARE_PROVIDER_SITE_OTHER): Payer: Medicare Other | Admitting: Cardiology

## 2020-07-05 VITALS — BP 130/70 | HR 69 | Ht 72.0 in | Wt 190.8 lb

## 2020-07-05 DIAGNOSIS — Z79899 Other long term (current) drug therapy: Secondary | ICD-10-CM | POA: Diagnosis not present

## 2020-07-05 DIAGNOSIS — I5042 Chronic combined systolic (congestive) and diastolic (congestive) heart failure: Secondary | ICD-10-CM

## 2020-07-05 DIAGNOSIS — I480 Paroxysmal atrial fibrillation: Secondary | ICD-10-CM

## 2020-07-05 DIAGNOSIS — I10 Essential (primary) hypertension: Secondary | ICD-10-CM | POA: Diagnosis not present

## 2020-07-05 DIAGNOSIS — G4733 Obstructive sleep apnea (adult) (pediatric): Secondary | ICD-10-CM | POA: Diagnosis not present

## 2020-07-05 DIAGNOSIS — E1140 Type 2 diabetes mellitus with diabetic neuropathy, unspecified: Secondary | ICD-10-CM

## 2020-07-05 DIAGNOSIS — J449 Chronic obstructive pulmonary disease, unspecified: Secondary | ICD-10-CM

## 2020-07-05 DIAGNOSIS — Z7189 Other specified counseling: Secondary | ICD-10-CM

## 2020-07-05 LAB — BASIC METABOLIC PANEL
BUN/Creatinine Ratio: 8 — ABNORMAL LOW (ref 9–20)
BUN: 9 mg/dL (ref 6–24)
CO2: 24 mmol/L (ref 20–29)
Calcium: 8.9 mg/dL (ref 8.7–10.2)
Chloride: 94 mmol/L — ABNORMAL LOW (ref 96–106)
Creatinine, Ser: 1.1 mg/dL (ref 0.76–1.27)
GFR calc Af Amer: 85 mL/min/{1.73_m2} (ref >59)
GFR calc non Af Amer: 74 mL/min/{1.73_m2} (ref >59)
Glucose: 170 mg/dL — ABNORMAL HIGH (ref 65–99)
Potassium: 4.4 mmol/L (ref 3.5–5.2)
Sodium: 134 mmol/L (ref 134–144)

## 2020-07-05 NOTE — Progress Notes (Signed)
Cardiology Office Note:    Date:  07/05/2020   ID:  Kenneth [name], DOB 1961-06-15, MRN [id number]  PCP:  [name], MD  Cardiologist:  Buford Dresser, MD PhD  Referring MD: [name],*   CC: follow up  History of Present Illness:    Kenneth [name] is a 59 y.o. male with a hx of hypertension, diabetes, tobacco use, non-ischemic cardiomyopathy with EF 30-35%, COPD who is seen for follow up today. I initially saw him 10/21/18 as a new patient to me at the request of [name],* for the evaluation and management of paroxysmal atrial fibrillation.  Cardiac history: he followed with a cardiologist in [place], then in [place], but he recently moved back to the area. Per care everywhere notes: history of angioedema on ACEi, was on losartan and tolerating in 2016; however in [place] he was not on ARB but hydralazine instead. Cath/echo in 2011 showed normal coronaries, reduced LV function with EF 40%. He was admitted for COPD and HF exacerbation in 11/2017 in [facility], and at that time he was on pradaxa for anticoagulation. He was seen by Roderic Palau in the afib clinic in 07/2018, started on apixaban and carvedilol uptitrated. Echo repeated 08/2018, below. Admitted for COPD exacerbation with heart failure exacerbation, discharged 06/22/20.  Today: Seen for close follow up given recent chest pressure and heart failure. Feeling slightly improved from last week with the increase in his imdur and lasix. Feels like he is able to do more, tightness in his chest now very rare. Weights have been stable around 190 lbs, feels like he is not using his inhaler as much with the second dose of lasix, though he doesn't feel like he makes as much urine with it. Not having breathing issues at night. BP 889-169 systolic, no low numbers. Since quitting smoking, he has been eating more, feels like some of the weight might be food. Energy, stamina slightly improved.   We reviewed  his medications. No issues.   Denies PND, orthopnea, LE edema or unexpected weight gain. No syncope or palpitations.  Past Medical History:  Diagnosis Date   Anxiety    Arthritis    Asthma    Atrial fibrillation (HCC)    CHF (congestive heart failure) (HCC)    COPD (chronic obstructive pulmonary disease) ([place])    Depression    Diabetes mellitus without complication (HCC)    Emphysema of lung (HCC)    Heart murmur    Hyperlipidemia    Hypertension    Sleep apnea with use of continuous positive airway pressure (CPAP)     Past Surgical History:  Procedure Laterality Date   NO PAST SURGERIES      Current Medications: Current Outpatient Medications on File Prior to Visit  Medication Sig   acetaminophen (TYLENOL) 325 MG tablet Take 650 mg by mouth every 6 (six) hours as needed for mild pain or headache.   albuterol (PROVENTIL HFA;VENTOLIN HFA) 108 (90 Base) MCG/ACT inhaler Inhale 1-2 puffs into the lungs every 4 (four) hours as needed for wheezing or shortness of breath.    albuterol (PROVENTIL) (2.5 MG/3ML) 0.083% nebulizer solution INHALE 1 VIAL VIA NEBULIZER 5 TIMES DAILY AS NEEDED FOR WHEEZING OR FOR SHORTNESS OF BREATH (Patient taking differently: Take 2.5 mg by nebulization 5 (five) times daily as needed for wheezing or shortness of breath. )   amLODipine (NORVASC) 5 MG tablet Take 1 tablet (5 mg total) by mouth daily.   apixaban (ELIQUIS) 5 MG TABS  tablet Take 1 tablet (5 mg total) by mouth 2 (two) times daily.   Blood Glucose Monitoring Suppl (ONE TOUCH ULTRA 2) w/Device KIT Use to test blood sugars 1-2 times daily.   busPIRone (BUSPAR) 15 MG tablet Take 1 tablet (15 mg total) by mouth 2 (two) times daily.   carvedilol (COREG) 12.5 MG tablet Take 1 tablet (12.5 mg total) by mouth 2 (two) times daily with a meal.   famotidine (PEPCID) 20 MG tablet TAKE 1 TABLET BY MOUTH  DAILY   FLUoxetine (PROZAC) 20 MG capsule TAKE 3 CAPSULES BY MOUTH  DAILY (Patient  taking differently: Take 60 mg by mouth daily. )   Fluticasone-Umeclidin-Vilant (TRELEGY ELLIPTA) 100-62.5-25 MCG/INH AEPB Inhale 1 puff into the lungs daily.   furosemide (LASIX) 40 MG tablet Take 1 tablet (40 mg total) by mouth 2 (two) times daily.   gabapentin (NEURONTIN) 300 MG capsule Take 1 capsule (300 mg total) by mouth 3 (three) times daily.   glucose blood (ONETOUCH ULTRA) test strip USE TO TEST BLOOD SUGAR 1-2 TIMES DAILY   ipratropium (ATROVENT) 0.02 % nebulizer solution Take 2.5 mLs (0.5 mg total) by nebulization 4 (four) times daily.   Lancets (ONETOUCH ULTRASOFT) lancets USE TO TEST BLOOD SUGAR 1-2 TIMES DAILY   losartan (COZAAR) 50 MG tablet Take 50 mg by mouth daily.   lubiprostone ([name]) 8 MCG capsule TAKE 1 CAPSULE(8 MCG) BY MOUTH TWICE DAILY WITH A MEAL (Patient taking differently: Take 8 mcg by mouth 2 (two) times daily with a meal. )   metFORMIN (GLUCOPHAGE) 500 MG tablet TAKE 1 TABLET BY MOUTH  TWICE DAILY WITH MEALS (Patient taking differently: Take 500 mg by mouth 2 (two) times daily with a meal. )   Multiple Vitamin (MULTIVITAMIN WITH MINERALS) TABS tablet Take 1 tablet by mouth daily.   nitroGLYCERIN (NITROSTAT) 0.4 MG SL tablet DISSOLVE 1 TABLET UNDER THE TONGUE EVERY 5 MINUTES AS  NEEDED FOR CHEST PAIN. MAX  OF 3 TABLETS IN 15 MINUTES. CALL 911 IF PAIN PERSISTS. (Patient taking differently: Place 0.4 mg under the tongue every 5 (five) minutes x 3 doses as needed for chest pain. )   pantoprazole (PROTONIX) 20 MG tablet Take 20 mg by mouth daily.   traZODone (DESYREL) 50 MG tablet TAKE 1/2 TO 1 TABLET BY  MOUTH AT BEDTIME AS NEEDED  FOR SLEEP.   isosorbide mononitrate (IMDUR) 30 MG 24 hr tablet Take 30 mg by mouth daily.   No current facility-administered medications on file prior to visit.     Allergies:   Lisinopril, Other, and Tomato   Social History   Tobacco Use   Smoking status: Former Smoker    Packs/day: 2.00    Years: 30.00    Pack  years: 60.00    Types: Cigarettes    Quit date: 05/01/2019    Years since quitting: 1.1   Smokeless tobacco: Never Used   Tobacco comment: smokes 2-3cigs per day  Vaping Use   Vaping Use: Never used  Substance Use Topics   Alcohol use: Yes    Comment: Drinks up to six beers around a game   Drug use: Yes    Types: Marijuana    Comment: occ marijuana    Family History: The patient's family history includes Coronary artery disease in his brother; Diabetes in his father, mother, and sister; Heart disease in his father, mother, and sister; Kidney disease in his sister; Liver disease in his maternal uncle. Father also had unknown heart disease. Mother  had cancer.   ROS:   Please see the history of present illness.  Additional pertinent ROS otherwise unremarkable.  EKGs/Labs/Other Studies Reviewed:    The following studies were reviewed today: Echo 04/07/20 1. LVEF is mildly depressed with inferior hypokinesis. Compared to  previous echo, LVEF is improved. . Left ventricular ejection fraction, by  estimation, is 45 to 50%. The left ventricle has mildly decreased  function. The left ventricle demonstrates  regional wall motion abnormalities (see scoring diagram/findings for  description). The left ventricular internal cavity size was mildly  dilated. Indeterminate diastolic filling due to E-A fusion.  2. Right ventricular systolic function is normal. The right ventricular  size is normal. There is normal pulmonary artery systolic pressure.  3. The mitral valve is abnormal. Trivial mitral valve regurgitation.  4. The aortic valve is abnormal. Aortic valve regurgitation is mild. Mild  to moderate aortic valve sclerosis/calcification is present, without any  evidence of aortic stenosis.  5. The inferior vena cava is normal in size with greater than 50%  respiratory variability, suggesting right atrial pressure of 3 mmHg.   Echo 06/13/19 1. Left ventricular ejection fraction, by  visual estimation, is 30 to  35%. The left ventricle has normal function. Normal left ventricular size.  There is moderately increased left ventricular hypertrophy.  2. Basal and mid inferolateral wall is abnormal.  3. Left ventricular diastolic Doppler parameters are indeterminate  pattern of LV diastolic filling.  4. Global right ventricle has normal systolic function.The right  ventricular size is normal. No increase in right ventricular wall  thickness.  5. Left atrial size was normal.  6. Right atrial size was normal.  7. The mitral valve is normal in structure. Trace mitral valve  regurgitation. No evidence of mitral stenosis.  8. The tricuspid valve is normal in structure. Tricuspid valve  regurgitation is trivial.  9. The aortic valve is normal in structure. Aortic valve regurgitation is  mild to moderate by color flow Doppler. Mild aortic valve sclerosis  without stenosis.  10. The pulmonic valve was normal in structure. Pulmonic valve  regurgitation is not visualized by color flow Doppler.  11. The inferior vena cava is normal in size with greater than 50%  respiratory variability, suggesting right atrial pressure of 3 mmHg.   Echo 09/21/18 Left ventricle: The cavity size was normal. Wall thickness was   normal. Systolic function was moderately to severely reduced. The   estimated ejection fraction was in the range of 30% to 35%.   Diffuse hypokinesis. There is hypokinesis of the inferior and   inferoseptal myocardium. Features are consistent with a   pseudonormal left ventricular filling pattern, with concomitant   abnormal relaxation and increased filling pressure (grade 2   diastolic dysfunction). - Aortic valve: Trileaflet; moderately thickened, moderately   calcified leaflets. There was mild regurgitation. - Left atrium: The atrium was moderately dilated. - Tricuspid valve: There was trivial regurgitation. - Pulmonary arteries: Systolic pressure was mildly  increased. PA   peak pressure: 32 mm Hg (S).  EKG:  EKG is personally reviewed.  The ekg ordered 06/28/20 demonstrates sinus bradycardia, LVH with repol changes, similar to prior to admission ECG  Recent Labs: 06/17/2020: ALT 17 06/20/2020: B Natriuretic Peptide 1,026.0 06/22/2020: BUN 24; Creatinine, Ser 1.34; Hemoglobin 11.4; Magnesium 2.1; Platelets 230; Potassium 4.5; Sodium 135  Recent Lipid Panel    Component Value Date/Time   CHOL 226 (H) 07/06/2018 1355   TRIG 190.0 (H) 07/06/2018 1355   HDL 68.40  07/06/2018 1355   CHOLHDL 3 07/06/2018 1355   VLDL 38.0 07/06/2018 1355   LDLCALC 119 (H) 07/06/2018 1355    Physical Exam:    VS:  BP 130/70    Pulse 69    Ht 6' (1.829 m)    Wt 190 lb 12.8 oz (86.5 kg)    SpO2 95%    BMI 25.88 kg/m     Wt Readings from Last 3 Encounters:  07/05/20 190 lb 12.8 oz (86.5 kg)  07/02/20 190 lb (86.2 kg)  06/28/20 188 lb (85.3 kg)    GEN: Well nourished, well developed in no acute distress HEENT: Normal, moist mucous membranes NECK: No JVD CARDIAC: regular rhythm, normal S1 and S2, no rubs or gallops. No murmur. VASCULAR: Radial and DP pulses 2+ bilaterally. No carotid bruits RESPIRATORY:  Distant breath sounds with prolonged expiratory phase  ABDOMEN: Soft, non-tender, non-distended MUSCULOSKELETAL:  Ambulates independently SKIN: Warm and dry, no edema NEUROLOGIC:  Alert and oriented x 3. No focal neuro deficits noted. PSYCHIATRIC:  Normal affect   ASSESSMENT:    1. Medication management   2. Chronic combined systolic and diastolic congestive heart failure ([place])   3. PAF (paroxysmal atrial fibrillation) ([place])   4. Obstructive sleep apnea   5. Essential hypertension   6. Chronic obstructive pulmonary disease, unspecified COPD type ([place])   7. Type 2 diabetes mellitus with diabetic neuropathy, without long-term current use of insulin ([place])   8. Cardiac risk counseling    PLAN:    chronic systolic and diastolic heart failure:  -improved  from one week ago, no visible JVD, breathing better -weight stable, but notes he has been eating more since quitting smoking. -will check BMET today. As he is doing well on 40 mg BID lasix, would continue this if renal function stable. If renal function down, would change to 40 mg daily with PRN second dose for weight gain or worsening shortness of breath -EF improved on most recent echo to 45-50% -tolerating carvedilol 12.5 mg BID, watch for bradycardia -had angioedema on ACEi before. Tolerating losartan 50 mg daily, will continue. Have discussed entresto, but given increased risk of angioedema on this, will continue ARB. -continue daily weight, salt avoidance, fluid restriction -counseled on red flag warning signs, including chest pain/pressure, that need immediate medical attention. His symptoms are much improved with the lasix and increased imdur  Paroxysmal atrial fibrillation -CHA2DS2/VAS Stroke Risk Points=3 -continue apixaban, denies bleeding issues  Hypertension: -control improving, at goal today and home numbers improving. -continue imdur 60 mg daily, lasix TBD as above -continue carvedilol, losartan, amlodipine  Sleep apnea: -has CPAP  Severe COPD: -followed by Dr. Lamonte Sakai, multiple recent admissions for COPD exacerbation -continue tobacco abstinence  Type II diabetes, with chronic systolic and diastolic dysfunction: -on metformin -have discussed SGLT2i, but cost is a concern for him. -we have previously discussed recommendation for statin given his risk and diabetes -no longer on aspirin as he is on apixaban  Prevention and risk counseling -recommend heart healthy/Mediterranean diet, with whole grains, fruits, vegetable, fish, lean meats, nuts, and olive oil. Limit salt. -recommend moderate walking, 3-5 times/week for 30-50 minutes each session. Aim for at least 150 minutes.week. Goal should be pace of 3 miles/hours, or walking 1.5 miles in 30 minutes -recommend avoidance  of tobacco products. Avoid excess alcohol. -ASCVD risk score: no known ASCVD The 10-year ASCVD risk score Mikey Bussing DC Jr., et al., 2013) is: 34.6%   Values used to calculate the score:  Age: 1 years     Sex: Male     Is Non-Hispanic African American: Yes     Diabetic: Yes     Tobacco smoker: Yes     Systolic Blood Pressure: 553 mmHg     Is BP treated: Yes     HDL Cholesterol: 68.4 mg/dL     Total Cholesterol: 226 mg/dL   Plan for follow up: 2 mos or sooner as needed  Buford Dresser, MD, PhD [place]   [facility]   Medication Adjustments/Labs and Tests Ordered: Current medicines are reviewed at length with the patient today.  Concerns regarding medicines are outlined above.  Orders Placed This Encounter  Procedures   Basic metabolic panel   No orders of the defined types were placed in this encounter.   Patient Instructions  Medication Instructions:  Your Physician recommend you continue on your current medication as directed.    *If you need a refill on your cardiac medications before your next appointment, please call your pharmacy*   Lab Work: Your physician recommends that you return for lab work today ( BMP).    Testing/Procedures: None ordered    Follow-Up: At [facility], you and your health needs are our priority.  As part of our continuing mission to provide you with exceptional heart care, we have created designated Provider Care Teams.  These Care Teams include your primary Cardiologist (physician) and Advanced Practice Providers (APPs -  Physician Assistants and Nurse Practitioners) who all work together to provide you with the care you need, when you need it.  We recommend signing up for the patient portal called "MyChart".  Sign up information is provided on this After Visit Summary.  MyChart is used to connect with patients for Virtual Visits (Telemedicine).  Patients are able to view lab/test results, encounter notes, upcoming  appointments, etc.  Non-urgent messages can be sent to your provider as well.   To learn more about what you can do with MyChart, go to [email or web address].    Your next appointment:   2 month(s)  The format for your next appointment:   In Person  Provider:   Buford Dresser, MD        Signed, Buford Dresser, MD PhD 07/05/2020 8:41 AM    [place]

## 2020-07-05 NOTE — Patient Instructions (Signed)
Medication Instructions:  Your Physician recommend you continue on your current medication as directed.    *If you need a refill on your cardiac medications before your next appointment, please call your pharmacy*   Lab Work: Your physician recommends that you return for lab work today ( BMP).    Testing/Procedures: None ordered    Follow-Up: At [facility], you and your health needs are our priority.  As part of our continuing mission to provide you with exceptional heart care, we have created designated Provider Care Teams.  These Care Teams include your primary Cardiologist (physician) and Advanced Practice Providers (APPs -  Physician Assistants and Nurse Practitioners) who all work together to provide you with the care you need, when you need it.  We recommend signing up for the patient portal called "MyChart".  Sign up information is provided on this After Visit Summary.  MyChart is used to connect with patients for Virtual Visits (Telemedicine).  Patients are able to view lab/test results, encounter notes, upcoming appointments, etc.  Non-urgent messages can be sent to your provider as well.   To learn more about what you can do with MyChart, go to [email or web address].    Your next appointment:   2 month(s)  The format for your next appointment:   In Person  Provider:   Jodelle Red, MD

## 2020-07-06 ENCOUNTER — Ambulatory Visit: Payer: Self-pay | Admitting: *Deleted

## 2020-07-06 ENCOUNTER — Other Ambulatory Visit: Payer: Self-pay | Admitting: *Deleted

## 2020-07-06 LAB — VITAMIN B1: Vitamin B1 (Thiamine): 60 nmol/L — ABNORMAL HIGH (ref 8–30)

## 2020-07-06 NOTE — Patient Outreach (Signed)
[facility]) Care Management  07/06/2020  Kenneth [name] June 02, 1961 [id number]   CSW attempted to reach pt by phone today and was unable.  CSW was able to leave a voice message and will try again per policy of 3 attempts in 10 business days.   [name], MSW, LCSW Clinical Social Worker  [facility] [phone]

## 2020-07-11 ENCOUNTER — Telehealth: Payer: Self-pay | Admitting: Emergency Medicine

## 2020-07-11 ENCOUNTER — Ambulatory Visit: Payer: Self-pay | Admitting: *Deleted

## 2020-07-11 NOTE — Telephone Encounter (Signed)
Called and spoke with patient, he stated that [employer] needs his new insurance card to cover his POC.  Advised I would let our PCC's know so they can provide that information.  PCC's: Can you please provide [employer] with a copy of his new insurance card?  Thank you.

## 2020-07-11 NOTE — Telephone Encounter (Addendum)
I called [employer] and spoke to [facility].  She states they have [facility] Medicare loaded for pt which is what we have.  She is going to make sure and give info to her co-worker that deals with [employer] to download exact info & put in her profile so no one will call us to ask for it again.  She couldn't see where anyone from there contacted the pt.  I spoke to pt & made him aware.  He said someone called him and talked to him about what he had to pay.  Pt seemed confused.  I told him I would call customer service.  I spoke to [place].  She states his insurance pays 80% and he would owe 20%,  I called pt & explained this.  He said he has Medicaid.  I told him we don't have that info.  He is going to call [employer] & give them Medicaid info and he said he would give [place] info as well when he comes back in or gets a bill.  Nothing further needed.

## 2020-07-12 ENCOUNTER — Encounter: Payer: Self-pay | Admitting: Emergency Medicine

## 2020-07-12 ENCOUNTER — Other Ambulatory Visit: Payer: Self-pay

## 2020-07-12 ENCOUNTER — Ambulatory Visit (INDEPENDENT_AMBULATORY_CARE_PROVIDER_SITE_OTHER): Payer: Medicare Other | Admitting: Emergency Medicine

## 2020-07-12 DIAGNOSIS — J9611 Chronic respiratory failure with hypoxia: Secondary | ICD-10-CM

## 2020-07-12 DIAGNOSIS — G4733 Obstructive sleep apnea (adult) (pediatric): Secondary | ICD-10-CM | POA: Diagnosis not present

## 2020-07-12 DIAGNOSIS — J449 Chronic obstructive pulmonary disease, unspecified: Secondary | ICD-10-CM | POA: Diagnosis not present

## 2020-07-12 NOTE — Assessment & Plan Note (Signed)
Continue CPAP with oxygen bled in 

## 2020-07-12 NOTE — Patient Instructions (Addendum)
Walking oximetry today to see if we can qualify you for oxygen with exertion, possibly a portable oxygen concentrator. Continue your CPAP at night with oxygen bled in.  Wear this reliably. Continue Trelegy 1 inhalation once daily.  Rinse and gargle after using. Continue albuterol, 2 puffs or 1 nebulizer treatment up to every 4 hours if needed for shortness of breath, chest tightness, wheezing. Flu shot up-to-date, COVID-19 vaccines up-to-date Follow with Dr Delton Coombes in 3 months or sooner if you have any problems.

## 2020-07-12 NOTE — Assessment & Plan Note (Addendum)
No evidence currently for an exacerbation although he did have acute dyspnea this morning, had to go back on his CPAP briefly.  No hearing wheezing.  No sputum.  Plan to continue his Trelegy, defer antibiotics or prednisone for now.  Follow closely COVID-19 and flu shots up-to-date

## 2020-07-12 NOTE — Assessment & Plan Note (Signed)
Nocturnal hypoxemia documented.  He is using oxygen bled in with his CPAP.  He also has portable tanks although I do not see any documentation that he desaturated with exertion.  We will clarify today with a walking oximetry, see if he can qualify for pulsed oxygen and POC

## 2020-07-12 NOTE — Progress Notes (Signed)
Subjective:    Patient ID: Kenneth [name], male    DOB: 04/20/1961, 59 y.o.   MRN: [id number]  HPI 59 year old smoker (50+ pack years) with a history of atrial fibrillation on anticoagulation, diabetes, hypertension, OSA on CPAP. He carries a history of asthma / COPD that was made many years ago. He is on Advair, uses albuterol several times a day, atrovent nebs qd.   He has been dealing with progressive dyspnea, often w exertion but can happen at rest as well. Happening over the last year. Lots of cough, usually dry. He hears wheeze most days. Has benefited from pred - frequent exacerbations.   He has good compliance with CPAP, an an auto-set 13-20cm H2O  ROV 05/02/20 --59 year old smoker with A. fib (anticoagulated), diabetes, hypertension, OSA on CPAP.  Seen in June for COPD/asthma.  Since I saw him he has been seen twice, admitted twice for cough and wheezing.  Treated with prednisone and antibiotics.  On the second hospitalization he was noted to have a significantly elevated BNP, chronic systolic CHF and was diuresed.  At our initial visit I stopped his Advair and start him on Trelegy to see if he would get more benefit.  He underwent pulmonary function testing today which I have reviewed, shows very severe obstruction without a bronchodilator response, restricted volumes (nitrogen washout) and a decreased diffusion capacity that does not fully correct for his alveolar volume. He feels that the Trelegy has helped some, still has exertional SOB and cough. When he was admitted they added O2 to his CPAP which he felt helped him. He has borrowed his partner's O2 but doesn't have it for his own device. He had desats to 89% w ambulation last time here. Uses albuterol 3-4x a day. He stopped smoking 2 weeks ago.    ROV 07/12/20 --59 year old gentleman with obstructive sleep apnea and severe COPD/asthma.  He has had multiple exacerbations of this as well as chronic systolic CHF.  His other medical history  significant for atrial fibrillation, diabetes, hypertension.  Currently managed on Trelegy. He has O2 for his CPAP / qhs. He is interested in possible POC.  He is compliant with his CPAP.  He reports that this morning when he woke up and took off his CPAP he went to get ready without his oxygen on and he developed acute dyspnea, exhaustion. He briefly had to get back on his CPAP. He did recover, but he called EMS for eval.  Saturations were 98 with CPAP on, dropped to 90% when he took the CPAP off. He had his flu shot 3 days ago, felt some chills, loss of appetite. No real change in sx leading up to the event this am.    Review of Systems As per HPI      Objective:   Physical Exam  Vitals:   07/12/20 0939  BP: 130/80  Pulse: 76  Temp: (!) 97.3 F (36.3 C)  TempSrc: Temporal  SpO2: 99%  Weight: 188 lb 6.4 oz (85.5 kg)  Height: 6' (1.829 m)   Gen: Pleasant, well-nourished, in no distress,  normal affect  ENT: No lesions,  mouth clear,  oropharynx clear, no postnasal drip  Neck: No JVD, no stridor  Lungs: No use of accessory muscles, distant , no wheeze  Cardiovascular: RRR, heart sounds normal, no murmur or gallops, no peripheral edema  Musculoskeletal: No deformities, no cyanosis or clubbing  Neuro: alert, awake, non focal  Skin: Warm, no lesions or rash     Assessment &  Plan:  Chronic obstructive pulmonary disease (HCC) No evidence currently for an exacerbation although he did have acute dyspnea this morning, had to go back on his CPAP briefly.  No hearing wheezing.  No sputum.  Plan to continue his Trelegy, defer antibiotics or prednisone for now.  Follow closely COVID-19 and flu shots up-to-date  Chronic respiratory failure with hypoxia (HCC) Nocturnal hypoxemia documented.  He is using oxygen bled in with his CPAP.  He also has portable tanks although I do not see any documentation that he desaturated with exertion.  We will clarify today with a walking oximetry, see  if he can qualify for pulsed oxygen and POC  Obstructive sleep apnea Continue CPAP with oxygen bled in  Levy Pupa, MD, PhD 07/12/2020, 10:00 AM [facility] [phone] or if no answer [phone]

## 2020-07-13 ENCOUNTER — Other Ambulatory Visit: Payer: Self-pay | Admitting: *Deleted

## 2020-07-13 NOTE — Patient Outreach (Signed)
[facility]) Care Management  07/13/2020  Kenneth [name] 11/17/1960 [id number]   CSW made contact with pt and confirmed pt's identity.  CSW introduced self,role and reason for call; mental health needs. Pt admits to having long standing depression and anxiety. He has been on a treatment plan including Prozac and another drug for over 10-15 year per pt's report.  "I think its run its course and I need something more".  Pt shared with CSW that he has increased heart rate and difficulty breathing at times when he feels anxious.  He has used relaxation techniques and is wanting to see a Therapist, sports for RX adjustments as well as Veterinary surgeon.  Pt reports his fiance is a strong source of support.  Pt denies any SI/HI.  Pt agreeable to  CSW seeking providers for him and arranging appointment(s).  CSW will plan to seek in network providers and make appointment for pt.  CSW will plan to update pt in the next 10 days with update on arrangements.    [name], MSW, LCSW Clinical Social Worker  [facility] [phone]

## 2020-07-16 ENCOUNTER — Telehealth: Payer: Self-pay

## 2020-07-16 NOTE — Progress Notes (Addendum)
Chronic Care Management Pharmacy Assistant   Name: Kenneth [name]  MRN: [id number] DOB: April 22, 1961  Reason for Encounter:COPD Disease State Call.  PCP : [name], MD  Allergies:   Allergies  Allergen Reactions   Lisinopril Swelling   Other Other (See Comments)    Lettuce : rash   Tomato Rash    Medications: Outpatient Encounter Medications as of 07/16/2020  Medication Sig Note   acetaminophen (TYLENOL) 325 MG tablet Take 650 mg by mouth every 6 (six) hours as needed for mild pain or headache.    albuterol (PROVENTIL HFA;VENTOLIN HFA) 108 (90 Base) MCG/ACT inhaler Inhale 1-2 puffs into the lungs every 4 (four) hours as needed for wheezing or shortness of breath.     albuterol (PROVENTIL) (2.5 MG/3ML) 0.083% nebulizer solution INHALE 1 VIAL VIA NEBULIZER 5 TIMES DAILY AS NEEDED FOR WHEEZING OR FOR SHORTNESS OF BREATH (Patient taking differently: Take 2.5 mg by nebulization 5 (five) times daily as needed for wheezing or shortness of breath. ) 06/21/2020: 10 day supply Last filled 06/11/20   amLODipine (NORVASC) 5 MG tablet Take 1 tablet (5 mg total) by mouth daily. 06/21/2020: 30 day supply Last filled 06/11/20   apixaban (ELIQUIS) 5 MG TABS tablet Take 1 tablet (5 mg total) by mouth 2 (two) times daily. 06/21/2020: 90 day supply Last filled 02/27/20   Blood Glucose Monitoring Suppl (ONE TOUCH ULTRA 2) w/Device KIT Use to test blood sugars 1-2 times daily.    busPIRone (BUSPAR) 15 MG tablet Take 1 tablet (15 mg total) by mouth 2 (two) times daily. 06/21/2020: 30 day supply Last filled 06/11/20   carvedilol (COREG) 12.5 MG tablet Take 1 tablet (12.5 mg total) by mouth 2 (two) times daily with a meal. 06/21/2020: 30 day supply Last filled 06/11/20   famotidine (PEPCID) 20 MG tablet TAKE 1 TABLET BY MOUTH  DAILY (Patient not taking: Reported on 07/12/2020) 06/21/2020: 90 day supply Last filled 01/31/20   FLUoxetine (PROZAC) 20 MG capsule TAKE 3 CAPSULES BY MOUTH  DAILY (Patient taking  differently: Take 60 mg by mouth daily. ) 06/21/2020: 90 day supply Last filled 01/07/20   Fluticasone-Umeclidin-Vilant (TRELEGY ELLIPTA) 100-62.5-25 MCG/INH AEPB Inhale 1 puff into the lungs daily. 06/21/2020: 30 day supply Last filled 06/14/20   furosemide (LASIX) 40 MG tablet Take 1 tablet (40 mg total) by mouth 2 (two) times daily.    gabapentin (NEURONTIN) 300 MG capsule Take 1 capsule (300 mg total) by mouth 3 (three) times daily. 06/21/2020: 30 day supply Last filled 06/15/20   glucose blood (ONETOUCH ULTRA) test strip USE TO TEST BLOOD SUGAR 1-2 TIMES DAILY    ipratropium (ATROVENT) 0.02 % nebulizer solution Take 2.5 mLs (0.5 mg total) by nebulization 4 (four) times daily. 06/21/2020: 6 day supply Last filled 06/11/20   isosorbide mononitrate (IMDUR) 30 MG 24 hr tablet Take 30 mg by mouth daily.    Lancets (ONETOUCH ULTRASOFT) lancets USE TO TEST BLOOD SUGAR 1-2 TIMES DAILY    losartan (COZAAR) 50 MG tablet Take 50 mg by mouth daily.    lubiprostone ([name]) 8 MCG capsule TAKE 1 CAPSULE(8 MCG) BY MOUTH TWICE DAILY WITH A MEAL (Patient taking differently: Take 8 mcg by mouth 2 (two) times daily with a meal. ) 06/21/2020: 15 day supply Last filled 06/11/20   metFORMIN (GLUCOPHAGE) 500 MG tablet TAKE 1 TABLET BY MOUTH  TWICE DAILY WITH MEALS (Patient taking differently: Take 500 mg by mouth 2 (two) times daily with a meal. ) 06/21/2020:  90 day supply Last filled 02/10/20   Multiple Vitamin (MULTIVITAMIN WITH MINERALS) TABS tablet Take 1 tablet by mouth daily.    nitroGLYCERIN (NITROSTAT) 0.4 MG SL tablet DISSOLVE 1 TABLET UNDER THE TONGUE EVERY 5 MINUTES AS  NEEDED FOR CHEST PAIN. MAX  OF 3 TABLETS IN 15 MINUTES. CALL 911 IF PAIN PERSISTS. (Patient taking differently: Place 0.4 mg under the tongue every 5 (five) minutes x 3 doses as needed for chest pain. ) 04/24/2020: PRN   pantoprazole (PROTONIX) 20 MG tablet Take 20 mg by mouth daily. 06/21/2020: 30 day supply Last filled 06/11/20   traZODone (DESYREL) 50 MG  tablet TAKE 1/2 TO 1 TABLET BY  MOUTH AT BEDTIME AS NEEDED  FOR SLEEP.    No facility-administered encounter medications on file as of 07/16/2020.    Current Diagnosis: Patient Active Problem List   Diagnosis Date Noted   Thiamine deficiency 07/02/2020   Acute respiratory distress 06/21/2020   Chronic respiratory failure with hypoxia ([place]) 06/01/2020   Obstructive sleep apnea 05/02/2020   Type 2 diabetes mellitus with diabetic neuropathy, without long-term current use of insulin ([place]) 04/24/2020   Hyperlipidemia    Acute on chronic combined systolic and diastolic CHF (congestive heart failure) ([place]) 04/07/2020   Chronic constipation 07/19/2019   Hospital discharge follow-up 06/17/2019   Shortness of breath 06/12/2019   Chest tightness 06/12/2019   Snoring 03/29/2019   Insomnia due to other mental disorder 11/15/2018   Anticoagulant long-term use 10/21/2018   Gastroesophageal reflux disease without esophagitis 08/10/2018   Tobacco abuse 08/10/2018   Chest pain 07/27/2018   PAF (paroxysmal atrial fibrillation) ([place]) 07/27/2018   Mild renal insufficiency 07/27/2018   Chronic combined systolic and diastolic heart failure ([place]) 07/27/2018   Slow transit constipation 07/20/2018   Essential hypertension 07/06/2018   Chronic obstructive pulmonary disease ([place]) 07/06/2018   Controlled type 2 diabetes mellitus without complication, without long-term current use of insulin ([place]) 07/06/2018   Depression 07/06/2018   Macrocytic anemia 07/06/2018   Healthcare maintenance 07/06/2018   Alcohol abuse 07/06/2018   Follow-Up:  Pharmacist Review   Current COPD regimen:   Proventil HFA  Albuterol 0.083% Neb Atrovent 0.02% Neb Mucinex 600 mg daily  Trelegy 100-62.5-25 1 puff daily  No flowsheet data found. Any recent hospitalizations or ED visits since last visit with CPP? Yes  06/17/20 ED COPD exacerbation 06/20/20 ED COPD exacerbation Reports COPD symptoms, including Shortness of breath  at rest, Symptoms worse at night and Wheezing What recent interventions/DTPs have been made by any provider to improve breathing since last visit: None ID Have you had exacerbation/flare-up since last visit? Yes  06/17/20 ED COPD exacerbation 06/20/20 ED COPD exacerbation  What do you do when you are short of breath?  Rescue medication and Rest  Respiratory Devices/Equipment Do you have a nebulizer? Yes Do you use a Peak Flow Meter? Yes  Patient states he has not used his peak meter because he has not had a cold. Do you use a maintenance inhaler? Yes How often do you forget to use your daily inhaler?  Patient reports he forget 2 to 3 days a week. Do you use a rescue inhaler? Yes How often do you use your rescue inhaler?  daily  Patient states he use is rescue inhaler 2-3 times a day. Do you use a spacer with your inhaler? No   Patient ask if he could get the dual solution that includes Atrovent because he use that in the ED and it was  a big relief.  Patient states he would like to [facility].  Adherence Review: Does the patient have >5 day gap between last estimated fill date for maintenance inhaler medications? Yes  [place] Pharmacist Assistant [phone]   CPA to contact pt and confirm dispense hx, remaining qtys in anticipation of medication synchronization plan.

## 2020-07-18 ENCOUNTER — Telehealth: Payer: Self-pay

## 2020-07-18 NOTE — Progress Notes (Addendum)
[facility] Management Pharmacy Assistant   Name: Lattie Cervi  MRN: [id number] DOB: 01-18-1961  Reason for Encounter: Medication Review   PCP : [name], MD  Allergies:   Allergies  Allergen Reactions   Lisinopril Swelling   Other Other (See Comments)    Lettuce : rash   Tomato Rash    Medications: Outpatient Encounter Medications as of 07/18/2020  Medication Sig Note   acetaminophen (TYLENOL) 325 MG tablet Take 650 mg by mouth every 6 (six) hours as needed for mild pain or headache.    albuterol (PROVENTIL HFA;VENTOLIN HFA) 108 (90 Base) MCG/ACT inhaler Inhale 1-2 puffs into the lungs every 4 (four) hours as needed for wheezing or shortness of breath.     albuterol (PROVENTIL) (2.5 MG/3ML) 0.083% nebulizer solution INHALE 1 VIAL VIA NEBULIZER 5 TIMES DAILY AS NEEDED FOR WHEEZING OR FOR SHORTNESS OF BREATH (Patient taking differently: Take 2.5 mg by nebulization 5 (five) times daily as needed for wheezing or shortness of breath. ) 06/21/2020: 10 day supply Last filled 06/11/20   amLODipine (NORVASC) 5 MG tablet Take 1 tablet (5 mg total) by mouth daily. 06/21/2020: 30 day supply Last filled 06/11/20   apixaban (ELIQUIS) 5 MG TABS tablet Take 1 tablet (5 mg total) by mouth 2 (two) times daily. 06/21/2020: 90 day supply Last filled 02/27/20   Blood Glucose Monitoring Suppl (ONE TOUCH ULTRA 2) w/Device KIT Use to test blood sugars 1-2 times daily.    busPIRone (BUSPAR) 15 MG tablet Take 1 tablet (15 mg total) by mouth 2 (two) times daily. 06/21/2020: 30 day supply Last filled 06/11/20   carvedilol (COREG) 12.5 MG tablet Take 1 tablet (12.5 mg total) by mouth 2 (two) times daily with a meal. 06/21/2020: 30 day supply Last filled 06/11/20   famotidine (PEPCID) 20 MG tablet TAKE 1 TABLET BY MOUTH  DAILY (Patient not taking: Reported on 07/12/2020) 06/21/2020: 90 day supply Last filled 01/31/20   FLUoxetine (PROZAC) 20 MG capsule TAKE 3 CAPSULES BY MOUTH  DAILY (Patient taking  differently: Take 60 mg by mouth daily. ) 06/21/2020: 90 day supply Last filled 01/07/20   Fluticasone-Umeclidin-Vilant (TRELEGY ELLIPTA) 100-62.5-25 MCG/INH AEPB Inhale 1 puff into the lungs daily. 06/21/2020: 30 day supply Last filled 06/14/20   furosemide (LASIX) 40 MG tablet Take 1 tablet (40 mg total) by mouth 2 (two) times daily.    gabapentin (NEURONTIN) 300 MG capsule Take 1 capsule (300 mg total) by mouth 3 (three) times daily. 06/21/2020: 30 day supply Last filled 06/15/20   glucose blood (ONETOUCH ULTRA) test strip USE TO TEST BLOOD SUGAR 1-2 TIMES DAILY    ipratropium (ATROVENT) 0.02 % nebulizer solution Take 2.5 mLs (0.5 mg total) by nebulization 4 (four) times daily. 06/21/2020: 6 day supply Last filled 06/11/20   isosorbide mononitrate (IMDUR) 30 MG 24 hr tablet Take 30 mg by mouth daily.    Lancets (ONETOUCH ULTRASOFT) lancets USE TO TEST BLOOD SUGAR 1-2 TIMES DAILY    losartan (COZAAR) 50 MG tablet Take 50 mg by mouth daily.    lubiprostone ([name]) 8 MCG capsule TAKE 1 CAPSULE(8 MCG) BY MOUTH TWICE DAILY WITH A MEAL (Patient taking differently: Take 8 mcg by mouth 2 (two) times daily with a meal. ) 06/21/2020: 15 day supply Last filled 06/11/20   metFORMIN (GLUCOPHAGE) 500 MG tablet TAKE 1 TABLET BY MOUTH  TWICE DAILY WITH MEALS (Patient taking differently: Take 500 mg by mouth 2 (two) times daily with a meal. )  06/21/2020: 90 day supply Last filled 02/10/20   Multiple Vitamin (MULTIVITAMIN WITH MINERALS) TABS tablet Take 1 tablet by mouth daily.    nitroGLYCERIN (NITROSTAT) 0.4 MG SL tablet DISSOLVE 1 TABLET UNDER THE TONGUE EVERY 5 MINUTES AS  NEEDED FOR CHEST PAIN. MAX  OF 3 TABLETS IN 15 MINUTES. CALL 911 IF PAIN PERSISTS. (Patient taking differently: Place 0.4 mg under the tongue every 5 (five) minutes x 3 doses as needed for chest pain. ) 04/24/2020: PRN   pantoprazole (PROTONIX) 20 MG tablet Take 20 mg by mouth daily. 06/21/2020: 30 day supply Last filled 06/11/20   traZODone (DESYREL) 50 MG  tablet TAKE 1/2 TO 1 TABLET BY  MOUTH AT BEDTIME AS NEEDED  FOR SLEEP.    No facility-administered encounter medications on file as of 07/18/2020.    Current Diagnosis: Patient Active Problem List   Diagnosis Date Noted   Thiamine deficiency 07/02/2020   Acute respiratory distress 06/21/2020   Chronic respiratory failure with hypoxia ([place]) 06/01/2020   Obstructive sleep apnea 05/02/2020   Type 2 diabetes mellitus with diabetic neuropathy, without long-term current use of insulin ([place]) 04/24/2020   Hyperlipidemia    Acute on chronic combined systolic and diastolic CHF (congestive heart failure) ([place]) 04/07/2020   Chronic constipation 07/19/2019   Hospital discharge follow-up 06/17/2019   Shortness of breath 06/12/2019   Chest tightness 06/12/2019   Snoring 03/29/2019   Insomnia due to other mental disorder 11/15/2018   Anticoagulant long-term use 10/21/2018   Gastroesophageal reflux disease without esophagitis 08/10/2018   Tobacco abuse 08/10/2018   Chest pain 07/27/2018   PAF (paroxysmal atrial fibrillation) ([place]) 07/27/2018   Mild renal insufficiency 07/27/2018   Chronic combined systolic and diastolic heart failure ([place]) 07/27/2018   Slow transit constipation 07/20/2018   Essential hypertension 07/06/2018   Chronic obstructive pulmonary disease ([place]) 07/06/2018   Controlled type 2 diabetes mellitus without complication, without long-term current use of insulin ([place]) 07/06/2018   Depression 07/06/2018   Macrocytic anemia 07/06/2018   Healthcare maintenance 07/06/2018   Alcohol abuse 07/06/2018    Follow-Up:  Pharmacist Review   Spoke with patient  to complete patient form to [facility].  [place] Pharmacist Assistant [phone]   Reviewed and signed off.

## 2020-07-20 ENCOUNTER — Ambulatory Visit: Payer: Medicare Other | Admitting: Cardiology

## 2020-07-20 ENCOUNTER — Telehealth: Payer: Self-pay | Admitting: Cardiology

## 2020-07-20 NOTE — Telephone Encounter (Signed)
    Patient calling for lab results 

## 2020-07-20 NOTE — Telephone Encounter (Signed)
Pt updated with lab results and verbalized understanding.  

## 2020-07-23 ENCOUNTER — Other Ambulatory Visit: Payer: Self-pay | Admitting: *Deleted

## 2020-07-23 ENCOUNTER — Telehealth: Payer: Self-pay | Admitting: Family Medicine

## 2020-07-23 NOTE — Telephone Encounter (Signed)
Patient is calling and wanted to speak to someone regarding medication, please advise. CB is [phone]

## 2020-07-23 NOTE — Patient Outreach (Signed)
[facility]) Care Management  07/23/2020  Kenneth [name] 05/11/1961 [id number]   CSW made contact with pt today and reminded him to expect a call from an in-network provider for mental health counseling and med management.  Pt reports he is doing ok; still inquiring about getting his oxygen tank traded out for the "other kind I see on tv".  CSW suggested to pt to send a [facility] message or call his Pulmonary Provider to ask about this.  CSW stressed to pt it has to be the right type as well as covered by insurance and they would be best to assist.  "It's so heavy for me to carry".  CSW will make [facility] RNCM aware of this as well as PCP and Care Team as appropriate.   Pt shares he is preparing for some teeth to be extracted.   CSW will touch base with pt later in week for updates.   [name], MSW, LCSW Clinical Social Worker  [facility] [phone]

## 2020-07-23 NOTE — Telephone Encounter (Signed)
Patient calling states that he has to have teeth pulled in a few weeks calling to see if Dr. Doreene Burke would send in pain medication for after he has his teeth pulled. Advised patient that the doctor who's pulling his teeth will be the one to prescribe pain medications but he could reach out to the dentist and see if they would fax over forms for doctor to sign to inform them if patient need to hold off on any of his medications until after procedure. Patient agrees to call and have them give [place] a call or send a fax.

## 2020-07-24 ENCOUNTER — Telehealth: Payer: Self-pay

## 2020-07-24 DIAGNOSIS — I10 Essential (primary) hypertension: Secondary | ICD-10-CM

## 2020-07-24 DIAGNOSIS — E119 Type 2 diabetes mellitus without complications: Secondary | ICD-10-CM

## 2020-07-24 NOTE — Progress Notes (Signed)
Chronic Care Management Pharmacy Assistant   Name: Kenneth [name]  MRN: [id number] DOB: 1960-10-05  Reason for Encounter:hypertension and Diabetes Disease State Call.  PCP : [name], MD  Allergies:   Allergies  Allergen Reactions  . Lisinopril Swelling  . Other Other (See Comments)    Lettuce : rash  . Tomato Rash    Medications: Outpatient Encounter Medications as of 07/24/2020  Medication Sig Note  . acetaminophen (TYLENOL) 325 MG tablet Take 650 mg by mouth every 6 (six) hours as needed for mild pain or headache.   . albuterol (PROVENTIL HFA;VENTOLIN HFA) 108 (90 Base) MCG/ACT inhaler Inhale 1-2 puffs into the lungs every 4 (four) hours as needed for wheezing or shortness of breath.    [name] albuterol (PROVENTIL) (2.5 MG/3ML) 0.083% nebulizer solution INHALE 1 VIAL VIA NEBULIZER 5 TIMES DAILY AS NEEDED FOR WHEEZING OR FOR SHORTNESS OF BREATH (Patient taking differently: Take 2.5 mg by nebulization 5 (five) times daily as needed for wheezing or shortness of breath. ) 06/21/2020: 10 day supply Last filled 06/11/20  . amLODipine (NORVASC) 5 MG tablet Take 1 tablet (5 mg total) by mouth daily. 06/21/2020: 30 day supply Last filled 06/11/20  . apixaban (ELIQUIS) 5 MG TABS tablet Take 1 tablet (5 mg total) by mouth 2 (two) times daily. 06/21/2020: 90 day supply Last filled 02/27/20  . Blood Glucose Monitoring Suppl (ONE TOUCH ULTRA 2) w/Device KIT Use to test blood sugars 1-2 times daily.   . busPIRone (BUSPAR) 15 MG tablet Take 1 tablet (15 mg total) by mouth 2 (two) times daily. 06/21/2020: 30 day supply Last filled 06/11/20  . carvedilol (COREG) 12.5 MG tablet Take 1 tablet (12.5 mg total) by mouth 2 (two) times daily with a meal. 06/21/2020: 30 day supply Last filled 06/11/20  . famotidine (PEPCID) 20 MG tablet TAKE 1 TABLET BY MOUTH  DAILY (Patient not taking: Reported on 07/12/2020) 06/21/2020: 90 day supply Last filled 01/31/20  . FLUoxetine (PROZAC) 20 MG capsule TAKE 3 CAPSULES BY  MOUTH  DAILY (Patient taking differently: Take 60 mg by mouth daily. ) 06/21/2020: 90 day supply Last filled 01/07/20  . Fluticasone-Umeclidin-Vilant (TRELEGY ELLIPTA) 100-62.5-25 MCG/INH AEPB Inhale 1 puff into the lungs daily. 06/21/2020: 30 day supply Last filled 06/14/20  . furosemide (LASIX) 40 MG tablet Take 1 tablet (40 mg total) by mouth 2 (two) times daily.   [name] gabapentin (NEURONTIN) 300 MG capsule Take 1 capsule (300 mg total) by mouth 3 (three) times daily. 06/21/2020: 30 day supply Last filled 06/15/20  . glucose blood (ONETOUCH ULTRA) test strip USE TO TEST BLOOD SUGAR 1-2 TIMES DAILY   . ipratropium (ATROVENT) 0.02 % nebulizer solution Take 2.5 mLs (0.5 mg total) by nebulization 4 (four) times daily. 06/21/2020: 6 day supply Last filled 06/11/20  . isosorbide mononitrate (IMDUR) 30 MG 24 hr tablet Take 30 mg by mouth daily.   . Lancets (ONETOUCH ULTRASOFT) lancets USE TO TEST BLOOD SUGAR 1-2 TIMES DAILY   . losartan (COZAAR) 50 MG tablet Take 50 mg by mouth daily.   [name] lubiprostone ([name]) 8 MCG capsule TAKE 1 CAPSULE(8 MCG) BY MOUTH TWICE DAILY WITH A MEAL (Patient taking differently: Take 8 mcg by mouth 2 (two) times daily with a meal. ) 06/21/2020: 15 day supply Last filled 06/11/20  . metFORMIN (GLUCOPHAGE) 500 MG tablet TAKE 1 TABLET BY MOUTH  TWICE DAILY WITH MEALS (Patient taking differently: Take 500 mg by mouth 2 (two) times daily with a meal. )  06/21/2020: 90 day supply Last filled 02/10/20  . Multiple Vitamin (MULTIVITAMIN WITH MINERALS) TABS tablet Take 1 tablet by mouth daily.   . nitroGLYCERIN (NITROSTAT) 0.4 MG SL tablet DISSOLVE 1 TABLET UNDER THE TONGUE EVERY 5 MINUTES AS  NEEDED FOR CHEST PAIN. MAX  OF 3 TABLETS IN 15 MINUTES. CALL 911 IF PAIN PERSISTS. (Patient taking differently: Place 0.4 mg under the tongue every 5 (five) minutes x 3 doses as needed for chest pain. ) 04/24/2020: PRN  . pantoprazole (PROTONIX) 20 MG tablet Take 20 mg by mouth daily. 06/21/2020: 30 day supply Last  filled 06/11/20  . traZODone (DESYREL) 50 MG tablet TAKE 1/2 TO 1 TABLET BY  MOUTH AT BEDTIME AS NEEDED  FOR SLEEP.    No facility-administered encounter medications on file as of 07/24/2020.    Current Diagnosis: Patient Active Problem List   Diagnosis Date Noted  . Thiamine deficiency 07/02/2020  . Acute respiratory distress 06/21/2020  . Chronic respiratory failure with hypoxia ([place]) 06/01/2020  . Obstructive sleep apnea 05/02/2020  . Type 2 diabetes mellitus with diabetic neuropathy, without long-term current use of insulin ([place]) 04/24/2020  . Hyperlipidemia   . Acute on chronic combined systolic and diastolic CHF (congestive heart failure) ([place]) 04/07/2020  . Chronic constipation 07/19/2019  . Hospital discharge follow-up 06/17/2019  . Shortness of breath 06/12/2019  . Chest tightness 06/12/2019  . Snoring 03/29/2019  . Insomnia due to other mental disorder 11/15/2018  . Anticoagulant long-term use 10/21/2018  . Gastroesophageal reflux disease without esophagitis 08/10/2018  . Tobacco abuse 08/10/2018  . Chest pain 07/27/2018  . PAF (paroxysmal atrial fibrillation) ([place]) 07/27/2018  . Mild renal insufficiency 07/27/2018  . Chronic combined systolic and diastolic heart failure ([place]) 07/27/2018  . Slow transit constipation 07/20/2018  . Essential hypertension 07/06/2018  . Chronic obstructive pulmonary disease ([place]) 07/06/2018  . Controlled type 2 diabetes mellitus without complication, without long-term current use of insulin ([place]) 07/06/2018  . Depression 07/06/2018  . Macrocytic anemia 07/06/2018  . Healthcare maintenance 07/06/2018  . Alcohol abuse 07/06/2018    Follow-Up:  Pharmacist Review   Reviewed chart prior to disease state call. Spoke with patient regarding BP  Recent Office Vitals: BP Readings from Last 3 Encounters:  07/12/20 130/80  07/05/20 130/70  07/02/20 138/80   Pulse Readings from Last 3 Encounters:  07/12/20 76  07/05/20 69  07/02/20 60      Wt Readings from Last 3 Encounters:  07/12/20 188 lb 6.4 oz (85.5 kg)  07/05/20 190 lb 12.8 oz (86.5 kg)  07/02/20 190 lb (86.2 kg)     Kidney Function Lab Results  Component Value Date/Time   CREATININE 1.10 07/05/2020 08:53 AM   CREATININE 1.34 (H) 06/22/2020 08:35 AM   GFR 88.57 02/14/2020 08:54 AM   GFRNONAA 74 07/05/2020 08:53 AM   GFRAA 85 07/05/2020 08:53 AM    BMP Latest Ref Rng & Units 07/05/2020 06/22/2020 06/20/2020  Glucose 65 - 99 mg/dL 170(H) 259(H) 221(H)  BUN 6 - 24 mg/dL 9 24(H) 13  Creatinine 0.76 - 1.27 mg/dL 1.10 1.34(H) 1.36(H)  BUN/Creat Ratio 9 - 20 8(L) - -  Sodium 134 - 144 mmol/L 134 135 137  Potassium 3.5 - 5.2 mmol/L 4.4 4.5 3.6  Chloride 96 - 106 mmol/L 94(L) 96(L) 99  CO2 20 - 29 mmol/L _0 Calcium 8.7 - 10.2 mg/dL 8.9 9.3 8.9    . Current antihypertensive regimen:   Amlodipine 5 mg daily   Carvedilol  12.5 mg twice daily   Furosemide 20 mg daily  Imdur 30 mg daily   Losartan 50 mg daily  Patient states he takes his Losartan as directed with out missing a dose.Patient denies any side effects at this time. Patient states he does need a refill of Losartan due to having 5 on hand as of 07/26/2020.   [name] How often are you checking your Blood Pressure? twice daily . Current home BP readings:  o Patient states he does not have his log at this time. . What recent interventions/DTPs have been made by any provider to improve Blood Pressure control since last CPP Visit: None ID . Any recent hospitalizations or ED visits since last visit with CPP? No . What diet changes have been made to improve Blood Pressure Control?  o Patient states he cooks at home without using salt. . What exercise is being done to improve your Blood Pressure Control?  o Patient reports he walks around here and there.  Adherence Review: Is the patient currently on ACE/ARB medication? Yes Does the patient have >5 day gap between last estimated fill dates?  Yes   Maryjean Ka  Recent Relevant Labs: Lab Results  Component Value Date/Time   HGBA1C 6.8 (H) 06/21/2020 09:14 AM   HGBA1C 6.4 (H) 04/13/2020 05:55 PM   MICROALBUR <0.7 07/06/2018 01:55 PM    Kidney Function Lab Results  Component Value Date/Time   CREATININE 1.10 07/05/2020 08:53 AM   CREATININE 1.34 (H) 06/22/2020 08:35 AM   GFR 88.57 02/14/2020 08:54 AM   GFRNONAA 74 07/05/2020 08:53 AM   GFRAA 85 07/05/2020 08:53 AM    . Current antihyperglycemic regimen:  o Metformin 500 mg twice daily   . Patient states he needs a refill of his metformin.Patient states he takes his metformin  as directed with out missing a dose.Patient denies any side effects at this time.  What recent interventions/DTPs have been made to improve glycemic control:  o None ID . Have there been any recent hospitalizations or ED visits since last visit with CPP? No . Patient reports hypoglycemic symptoms, including Pale, Sweaty, Shaky, Hungry, Nervous/irritable and Vision changes    Patient states he will drink some orange juice and eat some candy to help with relief. . Patient reports hyperglycemic symptoms, including blurry vision, excessive thirst, fatigue, polyuria and weakness   Patient states he will lay down for a few hours to help with relief. . How often are you checking your blood sugar? once daily . What are your blood sugars ranging?  o On 07/26/20 it was 110 after a meal. o Fasting: N/A o Before meals: N/A o After meals: N/A o Bedtime: N/A . During the week, how often does your blood glucose drop below 70? Never . Are you checking your feet daily/regularly?   Patient states his second toe on his left foot has been feeling numb.  Adherence Review: Is the patient currently on a STATIN medication? No Is the patient currently on ACE/ARB medication? Yes Does the patient have >5 day gap between last estimated fill dates? Yes   [place]  Pharmacist Assistant [phone]

## 2020-07-25 ENCOUNTER — Other Ambulatory Visit: Payer: Self-pay | Admitting: Cardiovascular Disease

## 2020-07-25 ENCOUNTER — Telehealth: Payer: Self-pay | Admitting: Family Medicine

## 2020-07-25 ENCOUNTER — Other Ambulatory Visit: Payer: Self-pay | Admitting: *Deleted

## 2020-07-25 DIAGNOSIS — R079 Chest pain, unspecified: Secondary | ICD-10-CM

## 2020-07-25 DIAGNOSIS — E119 Type 2 diabetes mellitus without complications: Secondary | ICD-10-CM

## 2020-07-25 DIAGNOSIS — J449 Chronic obstructive pulmonary disease, unspecified: Secondary | ICD-10-CM | POA: Diagnosis not present

## 2020-07-25 DIAGNOSIS — I5043 Acute on chronic combined systolic (congestive) and diastolic (congestive) heart failure: Secondary | ICD-10-CM

## 2020-07-25 MED ORDER — METFORMIN HCL 500 MG PO TABS: 500.0000 mg | ORAL_TABLET | Freq: Two times a day (BID) | ORAL | 1 refills | Status: DC

## 2020-07-25 MED ORDER — NITROGLYCERIN 0.4 MG SL SUBL: SUBLINGUAL_TABLET | SUBLINGUAL | 4 refills | Status: AC

## 2020-07-25 NOTE — Telephone Encounter (Signed)
*  STAT* If patient is at the pharmacy, call can be transferred to refill team.   1. Which medications need to be refilled? (please list name of each medication and dose if known)  furosemide (LASIX) 40 MG tablet  2. Which pharmacy/location (including street and city if local pharmacy) is medication to be sent to? [facility] - , [address] - [address]   3. Do they need a 30 day or 90 day supply?   90 day supply  

## 2020-07-25 NOTE — Telephone Encounter (Signed)
Spoke to pt and he states that he just needs the metformin, nitroglycerin, famotidine, and one touch ultra test strips sent to [facility].  The other 2 meds: Fluoxetine and Trazadone will be discussed at upcoming psych appt on 07/26/20.  Will call [facility] after lunch to advise then send message to Dr Doreene Burke.   Dm/cma

## 2020-07-25 NOTE — Addendum Note (Signed)
Addended by: Andrez Grime on: 07/25/2020 02:21 PM   Modules accepted: Orders

## 2020-07-25 NOTE — Telephone Encounter (Signed)
I sent a message to Dr. Doreene Burke about the medication request from pt earlier.  Please advise.

## 2020-07-25 NOTE — Telephone Encounter (Signed)
Patient is calling and wanted to speak to someone regarding medication, please advise. CB is [phone]

## 2020-07-25 NOTE — Telephone Encounter (Signed)
Kenneth [name] is calling and is requesting a refill for metformin, fluoxetine, nitroglycerin, trazodone samotidine, one touch ultra 2 [facility], please advise. CB is [phone]

## 2020-07-25 NOTE — Patient Outreach (Signed)
[facility]) Care Management  07/25/2020  Rajon Bisig 1960-11-14 [id number]  CSW made contact with pt who reports he has appointments tomorrow for his initial visits with the referral made for mental health assessment.  "I am seeing the Psychiatrist and the Nurse Practitioner".  Applauded pt for coordinating the appointments so quickly and reassured him they will be able to assist and offer him support (RX, counseling, etc as they determine necessary).    CSW will touch base with pt again next week.    [name], MSW, LCSW Clinical Social Worker  [facility] [employer] [phone]

## 2020-07-25 NOTE — Telephone Encounter (Signed)
This appears to have been taken care of by Dr. Judie Petit and Angelique Blonder.

## 2020-07-25 NOTE — Telephone Encounter (Signed)
I reviewed his most recent note from visit with pulmonology from 10/21. Plan did not include duoneb. He needs to check with them about the duo neb.

## 2020-07-25 NOTE — Telephone Encounter (Signed)
Please see message and advise.  Thank you. ° °

## 2020-07-25 NOTE — Telephone Encounter (Signed)
Caller Name: Lennox Laity Call back phone #: [phone]  MEDICATION(S): pt is requesting generic or brand duoneb nebulizer solution instead of the albuterol and ipratropium separately.  Preferred Pharmacy: fax [phone] [facility]

## 2020-07-26 ENCOUNTER — Ambulatory Visit: Payer: Medicare Other | Admitting: Family Medicine

## 2020-07-26 MED ORDER — FUROSEMIDE 40 MG PO TABS: 40.0000 mg | ORAL_TABLET | Freq: Two times a day (BID) | ORAL | 0 refills | Status: DC

## 2020-07-26 NOTE — Telephone Encounter (Signed)
I called and spoke with Kenneth [name] and advised him with Dr. Evangeline Gula feedback below. Patient verbalized understanding.

## 2020-07-26 NOTE — Addendum Note (Signed)
Addended by: Orlene Och on: 07/26/2020 09:18 AM   Modules accepted: Orders

## 2020-07-27 ENCOUNTER — Other Ambulatory Visit: Payer: Self-pay | Admitting: *Deleted

## 2020-07-27 ENCOUNTER — Telehealth: Payer: Self-pay | Admitting: Family Medicine

## 2020-07-27 NOTE — Patient Outreach (Signed)
[facility]) Care Management  07/27/2020  Baudelio Karnes 1961/05/27 [id number]   Outgoing call to follow up on COPD management, no answer.  HIPAA compliant voice message left, will follow up within the next 3-4 business days.  [name], [place], MSN [facility] Care Management  [facility] Manager [phone]

## 2020-07-27 NOTE — Telephone Encounter (Signed)
Baxter Hire is calling and wanted to speak to someone regarding getting medication changed, please advise. CB is [phone]

## 2020-07-31 ENCOUNTER — Ambulatory Visit: Payer: Self-pay | Admitting: *Deleted

## 2020-07-31 ENCOUNTER — Telehealth: Payer: Self-pay

## 2020-07-31 MED ORDER — IPRATROPIUM-ALBUTEROL 0.5-2.5 (3) MG/3ML IN SOLN: 3.0000 mL | Freq: Four times a day (QID) | RESPIRATORY_TRACT | 0 refills | Status: DC | PRN

## 2020-07-31 NOTE — Progress Notes (Signed)
Spoke with patient  per Clinical Pharmacist to inform patient to set up an office visit with  Clinical Pharmacist in the next 1-2 weeks? Clinical Pharmacist requests it to be in person and for him to bring in his inhalers so they can discuss them as we are entering into the colder months.Inform patient to bring his BP log  Patient verbalized understanding. Kenneth [name] Clinical Pharmacist Assistant [phone]   Patient appointment is setup for  a office visit on 08/06/2020 at 3:30 with clinical Pharmacist.

## 2020-07-31 NOTE — Telephone Encounter (Signed)
Kenneth [name] from [facility] pharmacy calling to ask if pt can get Duoneb for his nebulizer.  Pt would like to have ipratropium/albuteral solution instead of having them separately.  Please advise. Please send to [facility].

## 2020-08-01 ENCOUNTER — Other Ambulatory Visit: Payer: Self-pay | Admitting: *Deleted

## 2020-08-01 NOTE — Patient Outreach (Signed)
[facility]) Care Management  08/01/2020  Kenneth [name] 1961/03/12 [id number]   Outreach attempt #2, successful.  Member report he feel he is doing well, no hospitalizations or ED visits in the last month.  State he is breathing better since being on the oxygen, has also been taking antibiotics for an upcoming oral surgery.  He this has also inadvertently helped decrease exacerbation the last several weeks.  Has appointment with PCP on Monday, reminded to take medications to appointment.  He has continued to monitor blood pressure, weights, and blood sugars daily.  Today's readings were 147/76, 191 pounds, and 147.  Denies any urgent concerns at this time, agrees to follow up within the next month.  Goals Addressed            This Visit's Progress   . [facility] - Track and Manage My Symptoms   On track    Follow Up Date 08/31/2020   - develop a rescue plan - keep follow-up appointments    Why is this important?   Tracking your symptoms and other information about your health helps your doctor plan your care.  Write down the symptoms, the time of day, what you were doing and what medicine you are taking.  You will soon learn how to manage your symptoms.     Notes:   11/10 - reminded of appointments next week    . [facility] - Track and Manage My Triggers   On track    Follow Up Date 09/20/2020   - eliminate smoking in my home - identify and remove indoor air pollutants - listen for public air quality announcements every day    Why is this important?   Triggers are activities or things, like tobacco smoke or cold weather, that make your COPD (chronic obstructive pulmonary disease) flare-up.  Knowing these triggers helps you plan how to stay away from them.  When you cannot remove them, you can learn how to manage them.     Notes:   11/10 - Reviewed use of inhalers and oxygen      [name], RN, MSN [facility] Care Management  [facility] Manager [phone]

## 2020-08-02 ENCOUNTER — Other Ambulatory Visit: Payer: Self-pay | Admitting: *Deleted

## 2020-08-02 ENCOUNTER — Encounter (INDEPENDENT_AMBULATORY_CARE_PROVIDER_SITE_OTHER): Payer: Self-pay

## 2020-08-02 ENCOUNTER — Telehealth: Payer: Self-pay | Admitting: Physician Assistant

## 2020-08-02 ENCOUNTER — Other Ambulatory Visit: Payer: Self-pay | Admitting: Cardiology

## 2020-08-02 ENCOUNTER — Telehealth: Payer: Self-pay

## 2020-08-02 DIAGNOSIS — E1169 Type 2 diabetes mellitus with other specified complication: Secondary | ICD-10-CM

## 2020-08-02 DIAGNOSIS — I5043 Acute on chronic combined systolic (congestive) and diastolic (congestive) heart failure: Secondary | ICD-10-CM

## 2020-08-02 DIAGNOSIS — E1140 Type 2 diabetes mellitus with diabetic neuropathy, unspecified: Secondary | ICD-10-CM

## 2020-08-02 DIAGNOSIS — E785 Hyperlipidemia, unspecified: Secondary | ICD-10-CM

## 2020-08-02 MED ORDER — FUROSEMIDE 40 MG PO TABS: 40.0000 mg | ORAL_TABLET | Freq: Two times a day (BID) | ORAL | 11 refills | Status: AC

## 2020-08-02 NOTE — Telephone Encounter (Signed)
*  STAT* If patient is at the pharmacy, call can be transferred to refill team.   1. Which medications need to be refilled? (please list name of each medication and dose if known)  furosemide (LASIX) 40 MG tablet  2. Which pharmacy/location (including street and city if local pharmacy) is medication to be sent to? [facility] - [place], [place] - [place] [id number]  3. Do they need a 30 day or 90 day supply? 90 with refills  Pharmacy called to make the request

## 2020-08-02 NOTE — Telephone Encounter (Signed)
[facility] is requesting a prescription for ABILIFY 30 day supply.  [phone]

## 2020-08-02 NOTE — Progress Notes (Addendum)
Chronic Care Management Pharmacy Assistant   Name: Kenneth [name]  MRN: [id number] DOB: [birth year]  Reason for Encounter: Medication Review   PCP : [name], MD  Allergies:   Allergies  Allergen Reactions  . Lisinopril Swelling  . Other Other (See Comments)    Lettuce : rash  . Tomato Rash    Medications: Outpatient Encounter Medications as of 08/02/2020  Medication Sig Note  . acetaminophen (TYLENOL) 325 MG tablet Take 650 mg by mouth every 6 (six) hours as needed for mild pain or headache.   . albuterol (PROVENTIL HFA;VENTOLIN HFA) 108 (90 Base) MCG/ACT inhaler Inhale 1-2 puffs into the lungs every 4 (four) hours as needed for wheezing or shortness of breath.    [name] albuterol (PROVENTIL) (2.5 MG/3ML) 0.083% nebulizer solution INHALE 1 VIAL VIA NEBULIZER 5 TIMES DAILY AS NEEDED FOR WHEEZING OR FOR SHORTNESS OF BREATH (Patient taking differently: Take 2.5 mg by nebulization 5 (five) times daily as needed for wheezing or shortness of breath. ) 06/21/2020: 10 day supply Last filled 06/11/20  . amLODipine (NORVASC) 5 MG tablet Take 1 tablet (5 mg total) by mouth daily. 06/21/2020: 30 day supply Last filled 06/11/20  . apixaban (ELIQUIS) 5 MG TABS tablet Take 1 tablet (5 mg total) by mouth 2 (two) times daily. 06/21/2020: 90 day supply Last filled 02/27/20  . Blood Glucose Monitoring Suppl (ONE TOUCH ULTRA 2) w/Device KIT Use to test blood sugars 1-2 times daily.   . busPIRone (BUSPAR) 15 MG tablet Take 1 tablet (15 mg total) by mouth 2 (two) times daily. 06/21/2020: 30 day supply Last filled 06/11/20  . carvedilol (COREG) 12.5 MG tablet Take 1 tablet (12.5 mg total) by mouth 2 (two) times daily with a meal. 06/21/2020: 30 day supply Last filled 06/11/20  . famotidine (PEPCID) 20 MG tablet TAKE 1 TABLET BY MOUTH  DAILY (Patient not taking: Reported on 07/12/2020) 06/21/2020: 90 day supply Last filled 01/31/20  . FLUoxetine (PROZAC) 20 MG capsule TAKE 3 CAPSULES BY MOUTH  DAILY (Patient  taking differently: Take 60 mg by mouth daily. ) 06/21/2020: 90 day supply Last filled 01/07/20  . Fluticasone-Umeclidin-Vilant (TRELEGY ELLIPTA) 100-62.5-25 MCG/INH AEPB Inhale 1 puff into the lungs daily. 06/21/2020: 30 day supply Last filled 06/14/20  . furosemide (LASIX) 40 MG tablet Take 1 tablet (40 mg total) by mouth 2 (two) times daily.   [name] gabapentin (NEURONTIN) 300 MG capsule Take 1 capsule (300 mg total) by mouth 3 (three) times daily. 06/21/2020: 30 day supply Last filled 06/15/20  . glucose blood (ONETOUCH ULTRA) test strip USE TO TEST BLOOD SUGAR 1-2 TIMES DAILY   . ipratropium (ATROVENT) 0.02 % nebulizer solution Take 2.5 mLs (0.5 mg total) by nebulization 4 (four) times daily. 06/21/2020: 6 day supply Last filled 06/11/20  . ipratropium-albuterol (DUONEB) 0.5-2.5 (3) MG/3ML SOLN Take 3 mLs by nebulization every 6 (six) hours as needed.   . isosorbide mononitrate (IMDUR) 30 MG 24 hr tablet Take 30 mg by mouth daily.   . Lancets (ONETOUCH ULTRASOFT) lancets USE TO TEST BLOOD SUGAR 1-2 TIMES DAILY   . losartan (COZAAR) 50 MG tablet Take 50 mg by mouth daily.   [name] lubiprostone ([name]) 8 MCG capsule TAKE 1 CAPSULE(8 MCG) BY MOUTH TWICE DAILY WITH A MEAL (Patient taking differently: Take 8 mcg by mouth 2 (two) times daily with a meal. ) 06/21/2020: 15 day supply Last filled 06/11/20  . metFORMIN (GLUCOPHAGE) 500 MG tablet Take 1 tablet (500 mg total) by  mouth 2 (two) times daily with a meal.   . Multiple Vitamin (MULTIVITAMIN WITH MINERALS) TABS tablet Take 1 tablet by mouth daily.   . nitroGLYCERIN (NITROSTAT) 0.4 MG SL tablet DISSOLVE 1 TABLET UNDER THE TONGUE EVERY 5 MINUTES AS&nbsp;&nbsp;NEEDED FOR CHEST PAIN. MAX&nbsp;&nbsp;OF 3 TABLETS IN 15 MINUTES. CALL 911 IF PAIN PERSISTS.   [name] pantoprazole (PROTONIX) 20 MG tablet Take 20 mg by mouth daily. 06/21/2020: 30 day supply Last filled 06/11/20  . traZODone (DESYREL) 50 MG tablet TAKE 1/2 TO 1 TABLET BY  MOUTH AT BEDTIME AS NEEDED  FOR SLEEP.    No  facility-administered encounter medications on file as of 08/02/2020.    Current Diagnosis: Patient Active Problem List   Diagnosis Date Noted  . Thiamine deficiency 07/02/2020  . Acute respiratory distress 06/21/2020  . Chronic respiratory failure with hypoxia ([place]) 06/01/2020  . Obstructive sleep apnea 05/02/2020  . Type 2 diabetes mellitus with diabetic neuropathy, without long-term current use of insulin ([place]) 04/24/2020  . Hyperlipidemia   . Acute on chronic combined systolic and diastolic CHF (congestive heart failure) ([place]) 04/07/2020  . Chronic constipation 07/19/2019  . Hospital discharge follow-up 06/17/2019  . Shortness of breath 06/12/2019  . Chest tightness 06/12/2019  . Snoring 03/29/2019  . Insomnia due to other mental disorder 11/15/2018  . Anticoagulant long-term use 10/21/2018  . Gastroesophageal reflux disease without esophagitis 08/10/2018  . Tobacco abuse 08/10/2018  . Chest pain 07/27/2018  . PAF (paroxysmal atrial fibrillation) ([place]) 07/27/2018  . Mild renal insufficiency 07/27/2018  . Chronic combined systolic and diastolic heart failure ([place]) 07/27/2018  . Slow transit constipation 07/20/2018  . Essential hypertension 07/06/2018  . Chronic obstructive pulmonary disease ([place]) 07/06/2018  . Controlled type 2 diabetes mellitus without complication, without long-term current use of insulin ([place]) 07/06/2018  . Depression 07/06/2018  . Macrocytic anemia 07/06/2018  . Healthcare maintenance 07/06/2018  . Alcohol abuse 07/06/2018    Follow-Up:  Pharmacist Review   Reviewed chart for medication changes ahead of medication coordination call.  No OVs, Consults, or hospital visits since last care coordination call/Pharmacist visit.  No medication changes indicated   BP Readings from Last 3 Encounters:  07/12/20 130/80  07/05/20 130/70  07/02/20 138/80    Lab Results  Component Value Date   HGBA1C 6.8 (H) 06/21/2020     Patient obtains medications  through Vials  90 Days   Last adherence delivery included: (medication name and frequency)  None ID  Patient declined (meds) last month due to PRN use/additional supply on hand.  None ID  Patient is due for next adherence delivery on: 08/06/2020. Called patient and reviewed medications and coordinated delivery.  This delivery to include:  Isosorbide Mononitrate 30 MG Tablet Daily  -Breakfast  Carvedilol 12.5 MG Twice Daily  - Breakfast,Evening Meals  Amlodipine 5MG Tablet Daily  -Breakfast  Aripiprazole 2 mg Tablet Daily - Breakfast  Furosemide 40 Mg Tablet Twice Daily - Breakfast, Evening Meals   Lubiprostone 8 MCG Capsule Twice Daily - Breakfast, Evening Meals   Eliquis 5 MG Tablets twice daily -Breakfast, Evening Meals  Trelegy Ellipta 100-62.5-25 One  Puff Daily    Pantoprazole 40 MG Tablet Daily -Breakfast  Gabapentin 300 MG Capsule Three Times Daily - Breakfast, Lunch , Evening meals  Acute fill scheduled for 08/09/20 for Losartan 50MG Tablet Daily and Fluoxetine 20 MG Capsule Three Capsules Daily   Patient declined the following medications (meds) due to (reason)  Albuterol 108 MCG/ACT inhaler PRN - adequate supply  Albuterol 0.83% 25CT SOLN PRN  - Patient states he will get this from a company.  Buspirone 15 MG Tablet Two Times Daily - Breakfast, Evening meal- adequate supply  Nitroglycerin 0.4MG SL Tablet PRN -Adequate supply  Metformin HCI 500 MG Tablet Twice Daily - Breakfast, Evening Meal -adequate supply.  One Touch U-Soft Lancet 100 -Adequate supply  One touch Ult 100CT Test strip - Adequate Supply   Patient needs refills for None ID.  Confirmed delivery date of 08/06/2020, advised patient that pharmacy will contact them the morning of delivery.  Blood pressure readings:  On 07/29/20 it was 142/63 with a pulse of 70.  On 07/30/20 it was 150/80 with a pulse of 60.  On 07/31/20 it was 141/72 with a pulse of 63.  On 08/01/20 it was 147/73 with a pulse of  69.  Blood sugar readings:  On 07/29/20 it was 142 after a snack.  On 07/30/20 it was 130 after a snack.  On 07/31/20 it was 160 after a snack.  On 08/01/20 it was 142 after a snack.  [place] Pharmacist Assistant [phone]

## 2020-08-02 NOTE — Telephone Encounter (Signed)
Called [facility] and advised that we do not refill this medication.

## 2020-08-02 NOTE — Patient Outreach (Signed)
[facility] [place]) Care Management  08/02/2020  Azarel Banner 09-10-1961 [id number]   CSW made outreach call attempt to pt and was unable to reach. CSW was able to leave a HIPPA compliant voice message and will attempt outreach again if no return call is received per policy in 3-4 business days  [name], MSW, LCSW Clinical Social Worker  [facility] [phone]

## 2020-08-03 ENCOUNTER — Other Ambulatory Visit: Payer: Self-pay | Admitting: Family Medicine

## 2020-08-03 ENCOUNTER — Other Ambulatory Visit: Payer: Self-pay | Admitting: Physician Assistant

## 2020-08-03 ENCOUNTER — Other Ambulatory Visit: Payer: Self-pay | Admitting: *Deleted

## 2020-08-03 DIAGNOSIS — E1140 Type 2 diabetes mellitus with diabetic neuropathy, unspecified: Secondary | ICD-10-CM

## 2020-08-03 NOTE — Telephone Encounter (Signed)
Last OV 07/02/20 Last fill 04/24/20  #90/3

## 2020-08-03 NOTE — Patient Outreach (Signed)
[facility]) Care Management  08/03/2020  Kenneth [name] [birth year] [id number]   CSW spoke with pt by phone who reports he was able to have his counseling appointments and things went well with follow up visits arranged. Pt shared that he was able to get his oxygen issues handled and is awaiting some dental extractions next week.    CSW offered support and encouragement to pt. CSW reminded pt to call if needs arise before follow up call in the 3 weeks.   [name], MSW, LCSW Clinical Social Worker  [facility] [facility] [phone]

## 2020-08-06 ENCOUNTER — Ambulatory Visit: Payer: Medicare Other

## 2020-08-06 ENCOUNTER — Other Ambulatory Visit: Payer: Self-pay

## 2020-08-06 DIAGNOSIS — I10 Essential (primary) hypertension: Secondary | ICD-10-CM

## 2020-08-06 DIAGNOSIS — J449 Chronic obstructive pulmonary disease, unspecified: Secondary | ICD-10-CM

## 2020-08-06 DIAGNOSIS — E1140 Type 2 diabetes mellitus with diabetic neuropathy, unspecified: Secondary | ICD-10-CM

## 2020-08-06 NOTE — Chronic Care Management (AMB) (Signed)
Chronic Care Management Pharmacy  Name: Kenneth [name]  MRN: [id number] DOB: [birth year]   Chief Complaint/ HPI  Kenneth [name],  59 y.o. , male presents for their Follow-Up CCM visit with the clinical pharmacist In office.  PCP : [name], MD Patient Care Team: [name], MD as PCP - General (Family Medicine) Buford Dresser, MD as PCP - Cardiology (Cardiology) Valente David, RN as [place] Management Germaine Pomfret, [facility] as Pharmacist (Pharmacist)  Their chronic conditions include: Hypertension, Hyperlipidemia, Diabetes, Atrial Fibrillation, Heart Failure, GERD, COPD, Depression and Tobacco use   Office Visits: 04/24/20: Patient presented to Dr. Ethelene Hal for hospital follow-up. Patient started on Gabapentin for neuropathy. BP in clinic 150/78, patient started on losartan 50 mg daily.  03/22/20: Patient presented to [name], NP for constipation. Patient does not have adequate fiber or water intake. Patient started on Lubiprostone 8 mcg twice daily.   Consult Visit: 07/12/20: Patient presented to Dr. Royston Cowper (Pulmonology) for follow-up. Patient with dry wheezing, but no evidence of exacerbation. 07/05/20: Patient presented to Dr. Harrell Gave (Cardiology) for follow-up. BP in clinic 130/70. Continue losartan 50 mg daily, Imdur decreased to 30 mg daily.  06/01/20: Patient presented to Rexene Edison, NP (Pulmonology) for COPD follow-up. Patient still with symptoms of exacerbation. Doxycycline and prednisone course extended.  05/24/20: Patient presented to Rexene Edison, NP (Pulmonology) for COPD follow-up. Patient started on Doxycycline and Prednisone.  05/04/20: Patient presented to Dr. Harrell Gave (Cardiology) for follow-up. Euvolemic, EF improved. Discussed Ferne Coe with patient.  05/02/20: Patient presented to Dr. Lamonte Sakai (Pulmonology) for COPD with acute exacerbation. Patient wondering if CPAP with oxygen would help him.    7/23-7/26/21: Patient hospitalized for COPD exacerbation 7/16-7/18/21: Patient hospitalized for COPD exacerbation   Allergies  Allergen Reactions  . Lisinopril Swelling  . Other Other (See Comments)    Lettuce : rash  . Tomato Rash    Medications: Outpatient Encounter Medications as of 08/06/2020  Medication Sig Note  . acetaminophen (TYLENOL) 325 MG tablet Take 650 mg by mouth every 6 (six) hours as needed for mild pain or headache.   . albuterol (PROVENTIL HFA;VENTOLIN HFA) 108 (90 Base) MCG/ACT inhaler Inhale 1-2 puffs into the lungs every 4 (four) hours as needed for wheezing or shortness of breath.    [name] albuterol (PROVENTIL) (2.5 MG/3ML) 0.083% nebulizer solution INHALE 1 VIAL VIA NEBULIZER 5 TIMES DAILY AS NEEDED FOR WHEEZING OR FOR SHORTNESS OF BREATH (Patient taking differently: Take 2.5 mg by nebulization 5 (five) times daily as needed for wheezing or shortness of breath. ) 06/21/2020: 10 day supply Last filled 06/11/20  . amLODipine (NORVASC) 5 MG tablet Take 1 tablet (5 mg total) by mouth daily.   [name] apixaban (ELIQUIS) 5 MG TABS tablet Take 1 tablet (5 mg total) by mouth 2 (two) times daily.   . ARIPiprazole (ABILIFY) 2 MG tablet Take 2 mg by mouth daily.   . Blood Glucose Monitoring Suppl (ONE TOUCH ULTRA 2) w/Device KIT Use to test blood sugars 1-2 times daily.   . busPIRone (BUSPAR) 15 MG tablet Take 1 tablet (15 mg total) by mouth 2 (two) times daily. 06/21/2020: 30 day supply Last filled 06/11/20  . carvedilol (COREG) 12.5 MG tablet Take 1 tablet (12.5 mg total) by mouth 2 (two) times daily with a meal. 06/21/2020: 30 day supply Last filled 06/11/20  . famotidine (PEPCID) 20 MG tablet TAKE 1 TABLET BY MOUTH  DAILY (Patient not taking: Reported on 07/12/2020) 06/21/2020: 90 day  supply Last filled 01/31/20  . FLUoxetine (PROZAC) 20 MG capsule TAKE 3 CAPSULES BY MOUTH  DAILY 06/21/2020: 90 day supply Last filled 01/07/20  . Fluticasone-Umeclidin-Vilant (TRELEGY ELLIPTA) 100-62.5-25  MCG/INH AEPB Inhale 1 puff into the lungs daily. 06/21/2020: 30 day supply Last filled 06/14/20  . furosemide (LASIX) 40 MG tablet Take 1 tablet (40 mg total) by mouth 2 (two) times daily.   [name] gabapentin (NEURONTIN) 300 MG capsule TAKE ONE CAPSULE BY MOUTH THREE TIMES DAILY   . glucose blood (ONETOUCH ULTRA) test strip USE TO TEST BLOOD SUGAR 1-2 TIMES DAILY   . ipratropium (ATROVENT) 0.02 % nebulizer solution Take 2.5 mLs (0.5 mg total) by nebulization 4 (four) times daily. 06/21/2020: 6 day supply Last filled 06/11/20  . ipratropium-albuterol (DUONEB) 0.5-2.5 (3) MG/3ML SOLN Take 3 mLs by nebulization every 6 (six) hours as needed.   . isosorbide mononitrate (IMDUR) 30 MG 24 hr tablet Take 30 mg by mouth daily.   . Lancets (ONETOUCH ULTRASOFT) lancets USE TO TEST BLOOD SUGAR 1-2 TIMES DAILY   . losartan (COZAAR) 50 MG tablet Take 50 mg by mouth daily.   [name] lubiprostone ([name]) 8 MCG capsule TAKE 1 CAPSULE(8 MCG) BY MOUTH TWICE DAILY WITH A MEAL (Patient taking differently: Take 8 mcg by mouth 2 (two) times daily with a meal. ) 06/21/2020: 15 day supply Last filled 06/11/20  . metFORMIN (GLUCOPHAGE) 500 MG tablet Take 1 tablet (500 mg total) by mouth 2 (two) times daily with a meal.   . Multiple Vitamin (MULTIVITAMIN WITH MINERALS) TABS tablet Take 1 tablet by mouth daily.   . nitroGLYCERIN (NITROSTAT) 0.4 MG SL tablet DISSOLVE 1 TABLET UNDER THE TONGUE EVERY 5 MINUTES AS  NEEDED FOR CHEST PAIN. MAX  OF 3 TABLETS IN 15 MINUTES. CALL 911 IF PAIN PERSISTS.   [name] pantoprazole (PROTONIX) 40 MG tablet TAKE ONE TABLET BY MOUTH EVERY MORNING   . traZODone (DESYREL) 50 MG tablet TAKE 1/2 TO 1 TABLET BY  MOUTH AT BEDTIME AS NEEDED  FOR SLEEP.    No facility-administered encounter medications on file as of 08/06/2020.    Wt Readings from Last 3 Encounters:  07/12/20 188 lb 6.4 oz (85.5 kg)  07/05/20 190 lb 12.8 oz (86.5 kg)  07/02/20 190 lb (86.2 kg)    Current Diagnosis/Assessment:  SDOH Interventions       Most Recent Value  SDOH Interventions  Financial Strain Interventions Intervention Not Indicated  Transportation Interventions Intervention Not Indicated      Goals Addressed            This Visit's Progress   . Chronic Care Management       CARE PLAN ENTRY (see longitudinal plan of care for additional care plan information)  Current Barriers:  . Chronic Disease Management support, education, and care coordination needs related to Hypertension, Hyperlipidemia, Diabetes, Atrial Fibrillation, Heart Failure, GERD, COPD, Depression and Tobacco use    Hypertension BP Readings from Last 3 Encounters:  06/21/20 (!) 173/84  06/17/20 (!) 159/84  06/01/20 (!) 148/62   . Pharmacist Clinical Goal(s): o Over the next 90 days, patient will work with PharmD and providers to achieve BP goal <130/80 . Current regimen:  . Amlodipine 5 mg daily  . Carvedilol 12.5 mg twice daily  . Furosemide 20 mg daily . Imdur 30 mg daily  . Losartan 50 mg daily . Interventions: o Discussed low salt diet and exercising as tolerated extensively o Will initiate blood pressure monitoring plan  . Patient  self care activities - Over the next 90 days, patient will: o Check blood pressure 3-5 times weekly, document, and provide at future appointments o Ensure daily salt intake < 2300 mg/day  Hyperlipidemia Lab Results  Component Value Date/Time   LDLCALC 119 (H) 07/06/2018 01:55 PM   . Pharmacist Clinical Goal(s): o Over the next 90 days, patient will work with PharmD and providers to achieve LDL goal < 70 . Current regimen:  o None . Interventions: o Discussed low cholesterol diet and exercising as tolerated extensively o Will initiate cholesterol monitoring plan  o Recommend starting rosuvastatin 20 mg daily  Diabetes Lab Results  Component Value Date/Time   HGBA1C 6.4 (H) 04/13/2020 05:55 PM   HGBA1C 6.3 (H) 02/08/2020 10:41 PM   . Pharmacist Clinical Goal(s): o Over the next 90 days,  patient will work with PharmD and providers to achieve A1c goal <7% . Current regimen:  o Metformin 500 mg twice daily  . Interventions: o Discussed carbohydrate counting and exercising as tolerated extensively o Will initiate blood sugar monitoring plan  . Patient self care activities - Over the next 90 days, patient will: o Check blood sugar once daily, document, and provide at future appointments o Contact provider with any episodes of hypoglycemia  Medication management . Pharmacist Clinical Goal(s): o Over the next 90 days, patient will work with PharmD and providers to maintain optimal medication adherence . Current pharmacy: [facility] . Interventions o Comprehensive medication review performed. o Continue current medication management strategy . Patient self care activities - Over the next 90 days, patient will: o Take medications as prescribed o Report any questions or concerns to PharmD and/or provider(s)       AFIB   Managed by Dr. Harrell Gave  Patient is currently rate controlled.  Patient has failed these meds in past: n/a Patient is currently controlled on the following  medications:  . Carvedilol 12.5 mg twice daily  . Eliquis 5 mg twice daily   We discussed:  diet and exercise extensively. Patient denies unusual bruising/bleeding with Eliquis.   Plan  Continue current medications  Heart Failure   Managed by Dr. Harrell Gave  Type: Combined Systolic and Diastolic  Last ejection fraction: 45-50% (04/07/20) NYHA Class: II (slight limitation of activity) AHA HF Stage: B (Heart disease present - no symptoms present)  Patient has failed these meds in past: Lisinopril (Angioedema) Patient is currently controlled on the following  medications:  . Amlodipine 5 mg daily  . Carvedilol 12.5 mg twice daily  . Furosemide 40 mg twice daily . Imdur 30 mg daily  . Losartan 50 mg daily  We discussed:   Cardiology avoiding Entresto due to risk of  angioedema Weight has been up to 187 lbs, patient snacking more often.   Plan  Recommend starting Jardiance 10 mg daily. Given current COPD exacerbations will defer until later visit.   Hypertension   BP goal is:  <130/80  Office blood pressures are  BP Readings from Last 3 Encounters:  07/12/20 130/80  07/05/20 130/70  07/02/20 138/80   CMP Latest Ref Rng & Units 07/05/2020 06/22/2020 06/20/2020  Glucose 65 - 99 mg/dL 170(H) 259(H) 221(H)  BUN 6 - 24 mg/dL 9 24(H) 13  Creatinine 0.76 - 1.27 mg/dL 1.10 1.34(H) 1.36(H)  Sodium 134 - 144 mmol/L 134 135 137  Potassium 3.5 - 5.2 mmol/L 4.4 4.5 3.6  Chloride 96 - 106 mmol/L 94(L) 96(L) 99  CO2 20 - 29 mmol/L 24 27 30   Calcium  8.7 - 10.2 mg/dL 8.9 9.3 8.9  Total Protein 6.5 - 8.1 g/dL - - -  Total Bilirubin 0.3 - 1.2 mg/dL - - -  Alkaline Phos 38 - 126 U/L - - -  AST 15 - 41 U/L - - -  ALT 0 - 44 U/L - - -   Patient checks BP at home 3-5x per week Patient home BP readings are ranging:   BP Pulse  7-Nov 142/63 70  8-Nov 150/80 60  9-Nov 141/72 63  10-Nov 147/73 69  11-Nov 144/82 68  12-Nov 146/66 66  13-Nov 151/82 71  14-Nov 150/81 67  15-Nov    Average 146/75 67     Patient has failed these meds in the past: n/a Patient is currently uncontrolled on the following medications:  . Amlodipine 5 mg daily (AM)  . Carvedilol 12.5 mg twice daily  . Furosemide 20 mg daily . Imdur 30 mg daily  . Losartan 50 mg daily   We discussed diet and exercise extensively.   Plan  Recommend increasing losartan to 100 mg daily  Recommend BMP in 1-2 weeks after dose change to ensure stable renal function.   Diabetes   A1c goal <7%  Recent Relevant Labs: Lab Results  Component Value Date/Time   HGBA1C 6.8 (H) 06/21/2020 09:14 AM   HGBA1C 6.4 (H) 04/13/2020 05:55 PM   GFR 88.57 02/14/2020 08:54 AM   GFR 66.86 05/20/2019 08:25 AM   MICROALBUR <0.7 07/06/2018 01:55 PM    Last diabetic Eye exam: No results found for:  HMDIABEYEEXA  Last diabetic Foot exam: No results found for: HMDIABFOOTEX   Checking BG: Daily   Fasting:   7-Nov 142  8-Nov 130  9-Nov 160  10-Nov 142  11-Nov 166  12-Nov 118  13-Nov 98  14-Nov 158  15-Nov 168  Average 139   Patient has failed these meds in past: n/a Patient is currently uncontrolled on the following medications: [name] Metformin 500 mg twice daily   We discussed: diet and exercise extensively. A1c at goal, but recent fasting BG elevated.   Plan  Continue current medications at this time. If elevations persist, could consider increasing metformin dose.    Hyperlipidemia   LDL goal < 70  Lipid Panel     Component Value Date/Time   CHOL 226 (H) 07/06/2018 1355   TRIG 190.0 (H) 07/06/2018 1355   HDL 68.40 07/06/2018 1355   LDLCALC 119 (H) 07/06/2018 1355    Hepatic Function Latest Ref Rng & Units 06/17/2020 04/14/2020 04/13/2020  Total Protein 6.5 - 8.1 g/dL 6.5 5.4(L) 6.1(L)  Albumin 3.5 - 5.0 g/dL 3.7 3.0(L) 3.5  AST 15 - 41 U/L 21 18 37  ALT 0 - 44 U/L 17 33 41  Alk Phosphatase 38 - 126 U/L 54 41 50  Total Bilirubin 0.3 - 1.2 mg/dL 0.7 0.4 0.5     The 10-year ASCVD risk score Mikey Bussing DC Jr., et al., 2013) is: 35.8%   Values used to calculate the score:     Age: 78 years     Sex: Male     Is Non-Hispanic African American: Yes     Diabetic: Yes     Tobacco smoker: Yes     Systolic Blood Pressure: 034 mmHg     Is BP treated: Yes     HDL Cholesterol: 68.4 mg/dL     Total Cholesterol: 226 mg/dL   Patient has failed these meds in past: n/a Patient is currently uncontrolled on  the following medications:  . None  We discussed: Ok to stop aspirin per cardiology. Discussed the importance of statin therapy, patient was amenable to this change.   Plan  Recommend starting rosuvastatin 20 mg daily.  Recommend Fasting Lipid Panel   COPD   Managed by Dr. Lamonte Sakai  Last spirometry score: FEV1 25% (05/02/20)  Gold Grade: Gold 4 (FEV1<40%) Current COPD  Classification:  D (high sx, >/=2 exacerbations/yr)  Eosinophil count:   Lab Results  Component Value Date/Time   EOSPCT 3 06/20/2020 03:00 PM  %                               Eos (Absolute):  Lab Results  Component Value Date/Time   EOSABS 0.2 06/20/2020 03:00 PM    Tobacco Status:  Social History   Tobacco Use  Smoking Status Former Smoker  . Packs/day: 2.00  . Years: 30.00  . Pack years: 60.00  . Types: Cigarettes  . Quit date: 05/01/2019  . Years since quitting: 1.2  Smokeless Tobacco Never Used  Tobacco Comment   smokes 2-3cigs per day   Patient has failed these meds in past: n/a Patient is currently uncontrolled on the following  medications:  . Proventil HFA  . Albuterol 0.083% Neb . Atrovent 0.02% Neb . Mucinex 600 mg daily  . Trelegy 100-62.5-25 1 puff daily   Using maintenance inhaler regularly? Yes Frequency of rescue inhaler use:  1-2x per week. Uses Nebulizer 3-4 times daily. Once daily.   We discussed:  proper inhaler technique in depth.    Plan  Continue current medications  CPA to start weekly CAT score screenings.   Depression / Anxiety   PHQ9 Score:  PHQ9 SCORE ONLY 07/02/2020 04/24/2020 03/13/2020  PHQ-9 Total Score 0 0 17   GAD7 Score: GAD 7 : Generalized Anxiety Score 05/20/2019 11/15/2018 07/20/2018 07/06/2018  Nervous, Anxious, on Edge 1 1 1 2   Control/stop worrying 1 1 1 1   Worry too much - different things 1 1 1 1   Trouble relaxing 1 2 2 1   Restless 1 1 1 1   Easily annoyed or irritable 1 1 2 2   Afraid - awful might happen 1 0 1 1  Total GAD 7 Score 7 7 9 9    Patient has failed these meds in past: n/a Patient is currently controlled on the following medications:  . Buspirone 15 mg twice daily  . Fluoxetine 20 mg 3 caps daily  We discussed:  Patient interested in psychiatry referral  Plan  Continue current medications  GERD   Patient reports dysphagia or heartburn. Expresses understanding to avoid triggers such as fatty  foods and large meals.  Currently controlled on: [name] Famotidine 20 mg daily  . Pantoprazole 40 mg daily  . Good Sense Antacid chewable  Plan   Continue current medication.   Misc / OTC    . APAP 325 2 tabs q6hr PRN . Diphenhydramine 25 mg 2 tablet q6hr PRN  . Gabapentin 300 mg TID -Neuropathy in Feet (not as irritating)  . Lubiprostone 8 mcg twice daily . Multivitamin daily . Nitroglycerin 0.4 mg PRN  . Trazodone 50 mg QHS PRN   We discussed:  Has had to use his NG once in the past 2 weeks.   Plan  Continue current medications  Vaccines   Reviewed and discussed patient's vaccination history.    Immunization History  Administered Date(s) Administered  . Influenza,inj,Quad PF,6+ Mos  06/25/2018, 05/20/2019  . Influenza-Unspecified 06/28/2020  . PFIZER SARS-COV-2 Vaccination 12/22/2019, 01/16/2020  . Pneumococcal Polysaccharide-23 06/08/2016  . Zoster Recombinat (Shingrix) 07/22/2019    Medication Management   Pt uses [facility] for all medications  Reviewed patient's [facility] medication and Epic medication profile assuring there are no discrepancies or gaps in therapy. Confirmed all fill dates appropriate and verified with patient that there is a sufficient quantity of all prescribed medications at home. Informed patient to call me any time if needing medications before scheduled deliveries. The anticipated medication sync date is 08/09/20.  Follow up: 1 month phone visit  [place] Pharmacist [facility] at [facility]  [phone]

## 2020-08-09 ENCOUNTER — Telehealth: Payer: Self-pay

## 2020-08-09 NOTE — Progress Notes (Signed)
Bessie, Can you please reach out to Kenneth [name] and let him know that I spoke with Dr. Cristal Deer about his blood pressure? She was in agreement with increasing his losartan to 100 mg daily. Can you check with him to make sure he is ok with that? We recently delivered his packs for this month. Would he prefer for [place] to send an extra 50 mg pill to take in addition to his packs, or would he rather wait until next month when we can send him the new Rx in his packs with his other medications?  Thanks, Kenneth [name] Clinical Pharmacist [place] [facility] at [facility]  [phone]

## 2020-08-09 NOTE — Progress Notes (Signed)
Spoke with patient to inform him per clinical pharmacist after discussion with   Dr. Cristal Deer cardiology about his blood pressure. She was in agreement with increasing his losartan to 100 mg daily.  We recently delivered his packs for this month. Would he prefer for [place] to send an extra 50 mg pill to take in addition to his packs, or would he rather wait until next month when we can send him the new Rx in his packs with his other medications?  Patient verbalized understanding and agrees to increase his losartan to 100 mg daily. Patient states he is okay with sending him an extra 50 mg tablet to take with his packs for the month.  Request sent to pharmacy for losartan 50 mg tablet.   Everlean Cherry Clinical Pharmacist Assistant [phone]

## 2020-08-09 NOTE — Telephone Encounter (Signed)
-----   Message from Gaspar Cola, [facility] sent at 08/09/2020 11:16 AM EST -----   ----- Message ----- From: Jodelle Red, MD Sent: 08/09/2020  10:45 AM EST To: Gaspar Cola, RPH  I think if he's been consistently high that increasing to 100 mg daily sounds great. His recent labs look good. Let's have him take 100 mg daily until he sees me and then I can recheck labs. Do you want me to change the prescription or would you be able to do it? Thank you for following him.  Jodelle Red ----- Message ----- From: Gaspar Cola, [facility] Sent: 08/08/2020   9:46 AM EST To: Jodelle Red, MD  Dr. Cristal Deer,  I recently spoke with your patient for chronic care management. Over the past 1-2 weeks his blood pressure has been moderately elevated (Average 146/75, Pulse 67). I was unsure if you were managing the patient's blood pressure or if it was the patient's PCP. What would you think about increasing his losartan to 100 mg daily? You have also have a follow-up scheduled with him on 12/15 if you would prefer to wait to then.   Thank you for the opportunity to be involved in the care of your patient, Garey Ham Clinical Pharmacist [facility] at [facility]  [phone]

## 2020-08-10 NOTE — Progress Notes (Signed)
Dr. Cristal Deer,   I am unable to update prescriptions at this time. Can you please send in a new prescription for losartan 100 mg daily into [facility]? I spoke with patient about the change and he verbalized his understanding.   Thanks!  Garey Ham Clinical Pharmacist [place] [facility] at [facility]  [phone]

## 2020-08-13 ENCOUNTER — Other Ambulatory Visit: Payer: Self-pay | Admitting: Cardiology

## 2020-08-13 MED ORDER — LOSARTAN POTASSIUM 100 MG PO TABS: 100.0000 mg | ORAL_TABLET | Freq: Every day | ORAL | 3 refills | Status: DC

## 2020-08-15 ENCOUNTER — Telehealth: Payer: Self-pay

## 2020-08-15 DIAGNOSIS — J80 Acute respiratory distress syndrome: Secondary | ICD-10-CM | POA: Diagnosis not present

## 2020-08-15 DIAGNOSIS — R062 Wheezing: Secondary | ICD-10-CM | POA: Diagnosis not present

## 2020-08-15 DIAGNOSIS — R0602 Shortness of breath: Secondary | ICD-10-CM | POA: Diagnosis not present

## 2020-08-15 DIAGNOSIS — R52 Pain, unspecified: Secondary | ICD-10-CM | POA: Diagnosis not present

## 2020-08-15 DIAGNOSIS — R069 Unspecified abnormalities of breathing: Secondary | ICD-10-CM | POA: Diagnosis not present

## 2020-08-15 NOTE — Telephone Encounter (Signed)
Patient calling states that EMS just left his house, he called them because he said he was having trouble breathing they evaluated patient and recommended ED visit but patient declined to go with them. Patient calling to see if Prenisone could be sent in for him to treat himself at home. Per patient he have had to [facility] this in the past. Advised patient that if he was asked to go to ED then he should go to be evaluated. Patient asked the message to be sent to the doctor for recommendations. Please advise.

## 2020-08-20 NOTE — Telephone Encounter (Signed)
LFT VM to rtn call to check on patient and see if he went to get checked out.  Dm/cma

## 2020-08-21 ENCOUNTER — Telehealth: Payer: Self-pay

## 2020-08-21 NOTE — Telephone Encounter (Signed)
Patient had sent a message through My Chart asking for a nurse to call him. Called the patient. He is complaining of constipation and abdominal discomfort. States when he is constipated , it feels like it makes his breathing more difficult. He feels bloated. He admits he forgets to take the [name]. He will restart that. Discussed Miralax and how he can use that as well to soften the stool. Discussed the importance of drinking enough water to help these medications work more effectively for him. The patient states he will try these things as discussed and recommended at the last office visit. He will call us if he continues to have issues despite these measures.

## 2020-08-22 ENCOUNTER — Ambulatory Visit: Payer: Self-pay | Admitting: *Deleted

## 2020-08-24 ENCOUNTER — Telehealth: Payer: Self-pay

## 2020-08-24 ENCOUNTER — Other Ambulatory Visit: Payer: Self-pay | Admitting: *Deleted

## 2020-08-24 DIAGNOSIS — F418 Other specified anxiety disorders: Secondary | ICD-10-CM

## 2020-08-24 MED ORDER — IPRATROPIUM-ALBUTEROL 0.5-2.5 (3) MG/3ML IN SOLN: 3.0000 mL | Freq: Four times a day (QID) | RESPIRATORY_TRACT | 0 refills | Status: DC | PRN

## 2020-08-24 NOTE — Telephone Encounter (Signed)
Kenneth [name] from [facility] pharmacy calling to let us know that they did not receive the electronic rx sent on 07/31/20 for the Duoneb.  Kenneth [name] wanted to know if we could possibly fax the rx over to them.  I did inform her that Dr. Doreene Burke is out of office and that Hyman Hopes will be in office next week.  Fax# [phone].  Please advise.

## 2020-08-24 NOTE — Patient Outreach (Signed)
[facility]) Care Management  08/24/2020  Vencent Hauschild 03-04-1961 [id number]   CSW was unable to reach pt on 08/23/2020 and left a HIPPA compliant voice message. CSW will await callback or try again in 3-4 business days.   [name], MSW, LCSW Clinical Social Worker  [facility] [employer] [phone]

## 2020-08-24 NOTE — Progress Notes (Signed)
Chronic Care Management Pharmacy Assistant   Name: Kenneth [name]  MRN: [id number] DOB: 10-04-1960  Reason for Encounter: Medication Review   PCP : [name], MD  Allergies:   Allergies  Allergen Reactions  . Lisinopril Swelling  . Other Other (See Comments)    Lettuce : rash  . Tomato Rash    Medications: Outpatient Encounter Medications as of 08/24/2020  Medication Sig Note  . acetaminophen (TYLENOL) 325 MG tablet Take 650 mg by mouth every 6 (six) hours as needed for mild pain or headache.   . albuterol (PROVENTIL HFA;VENTOLIN HFA) 108 (90 Base) MCG/ACT inhaler Inhale 1-2 puffs into the lungs every 4 (four) hours as needed for wheezing or shortness of breath.    [name] albuterol (PROVENTIL) (2.5 MG/3ML) 0.083% nebulizer solution INHALE 1 VIAL VIA NEBULIZER 5 TIMES DAILY AS NEEDED FOR WHEEZING OR FOR SHORTNESS OF BREATH (Patient taking differently: Take 2.5 mg by nebulization 5 (five) times daily as needed for wheezing or shortness of breath. ) 06/21/2020: 10 day supply Last filled 06/11/20  . amLODipine (NORVASC) 5 MG tablet Take 1 tablet (5 mg total) by mouth daily.   [name] apixaban (ELIQUIS) 5 MG TABS tablet Take 1 tablet (5 mg total) by mouth 2 (two) times daily.   . ARIPiprazole (ABILIFY) 2 MG tablet Take 2 mg by mouth daily.   . Blood Glucose Monitoring Suppl (ONE TOUCH ULTRA 2) w/Device KIT Use to test blood sugars 1-2 times daily.   . busPIRone (BUSPAR) 15 MG tablet Take 1 tablet (15 mg total) by mouth 2 (two) times daily. 06/21/2020: 30 day supply Last filled 06/11/20  . carvedilol (COREG) 12.5 MG tablet Take 1 tablet (12.5 mg total) by mouth 2 (two) times daily with a meal. 06/21/2020: 30 day supply Last filled 06/11/20  . famotidine (PEPCID) 20 MG tablet TAKE 1 TABLET BY MOUTH  DAILY (Patient not taking: Reported on 07/12/2020) 06/21/2020: 90 day supply Last filled 01/31/20  . FLUoxetine (PROZAC) 20 MG capsule TAKE 3 CAPSULES BY MOUTH  DAILY 06/21/2020: 90 day supply Last  filled 01/07/20  . Fluticasone-Umeclidin-Vilant (TRELEGY ELLIPTA) 100-62.5-25 MCG/INH AEPB Inhale 1 puff into the lungs daily. 06/21/2020: 30 day supply Last filled 06/14/20  . furosemide (LASIX) 40 MG tablet Take 1 tablet (40 mg total) by mouth 2 (two) times daily.   [name] gabapentin (NEURONTIN) 300 MG capsule TAKE ONE CAPSULE BY MOUTH THREE TIMES DAILY   . glucose blood (ONETOUCH ULTRA) test strip USE TO TEST BLOOD SUGAR 1-2 TIMES DAILY   . ipratropium (ATROVENT) 0.02 % nebulizer solution Take 2.5 mLs (0.5 mg total) by nebulization 4 (four) times daily. 06/21/2020: 6 day supply Last filled 06/11/20  . ipratropium-albuterol (DUONEB) 0.5-2.5 (3) MG/3ML SOLN Take 3 mLs by nebulization every 6 (six) hours as needed.   . isosorbide mononitrate (IMDUR) 30 MG 24 hr tablet Take 30 mg by mouth daily.   . Lancets (ONETOUCH ULTRASOFT) lancets USE TO TEST BLOOD SUGAR 1-2 TIMES DAILY   . losartan (COZAAR) 100 MG tablet Take 1 tablet (100 mg total) by mouth daily.   [name] lubiprostone ([name]) 8 MCG capsule TAKE 1 CAPSULE(8 MCG) BY MOUTH TWICE DAILY WITH A MEAL (Patient taking differently: Take 8 mcg by mouth 2 (two) times daily with a meal. ) 06/21/2020: 15 day supply Last filled 06/11/20  . metFORMIN (GLUCOPHAGE) 500 MG tablet Take 1 tablet (500 mg total) by mouth 2 (two) times daily with a meal.   . Multiple Vitamin (MULTIVITAMIN WITH  MINERALS) TABS tablet Take 1 tablet by mouth daily.   . nitroGLYCERIN (NITROSTAT) 0.4 MG SL tablet DISSOLVE 1 TABLET UNDER THE TONGUE EVERY 5 MINUTES AS&nbsp;&nbsp;NEEDED FOR CHEST PAIN. MAX&nbsp;&nbsp;OF 3 TABLETS IN 15 MINUTES. CALL 911 IF PAIN PERSISTS.   [name] pantoprazole (PROTONIX) 40 MG tablet TAKE ONE TABLET BY MOUTH EVERY MORNING   . traZODone (DESYREL) 50 MG tablet TAKE 1/2 TO 1 TABLET BY  MOUTH AT BEDTIME AS NEEDED  FOR SLEEP.    No facility-administered encounter medications on file as of 08/24/2020.    Current Diagnosis: Patient Active Problem List   Diagnosis Date Noted  .  Thiamine deficiency 07/02/2020  . Acute respiratory distress 06/21/2020  . Chronic respiratory failure with hypoxia ([place]) 06/01/2020  . Obstructive sleep apnea 05/02/2020  . Type 2 diabetes mellitus with diabetic neuropathy, without long-term current use of insulin ([place]) 04/24/2020  . Hyperlipidemia   . Acute on chronic combined systolic and diastolic CHF (congestive heart failure) ([place]) 04/07/2020  . Chronic constipation 07/19/2019  . Hospital discharge follow-up 06/17/2019  . Shortness of breath 06/12/2019  . Chest tightness 06/12/2019  . Snoring 03/29/2019  . Insomnia due to other mental disorder 11/15/2018  . Anticoagulant long-term use 10/21/2018  . Gastroesophageal reflux disease without esophagitis 08/10/2018  . Tobacco abuse 08/10/2018  . Chest pain 07/27/2018  . PAF (paroxysmal atrial fibrillation) ([place]) 07/27/2018  . Mild renal insufficiency 07/27/2018  . Chronic combined systolic and diastolic heart failure ([place]) 07/27/2018  . Slow transit constipation 07/20/2018  . Essential hypertension 07/06/2018  . Chronic obstructive pulmonary disease ([place]) 07/06/2018  . Controlled type 2 diabetes mellitus without complication, without long-term current use of insulin ([place]) 07/06/2018  . Depression 07/06/2018  . Macrocytic anemia 07/06/2018  . Healthcare maintenance 07/06/2018  . Alcohol abuse 07/06/2018    Follow-Up:  Pharmacist Review   Reviewed chart for medication changes ahead of medication coordination call.  No OVs, Consults, or hospital visits since last care coordination call/Pharmacist visit.  No medication changes indicated BP Readings from Last 3 Encounters:  07/12/20 130/80  07/05/20 130/70  07/02/20 138/80    Lab Results  Component Value Date   HGBA1C 6.8 (H) 06/21/2020     Patient obtains medications through Vials  90 Days   Last adherence delivery included: (medication name and frequency)  Isosorbide Mononitrate 30 MG Tablet Daily  -Breakfast              Carvedilol 12.5 MG Twice Daily  - Breakfast,Evening Meals             Amlodipine 5MG Tablet Daily  -Breakfast             Aripiprazole 2 mg Tablet Daily - Breakfast             Furosemide 40 Mg Tablet Twice Daily - Breakfast, Evening Meals              Lubiprostone 8 MCG Capsule Twice Daily - Breakfast, Evening Meals              Eliquis 5 MG Tablets twice daily -Breakfast, Evening Meals             Trelegy Ellipta 100-62.5-25 One  Puff Daily               Pantoprazole 40 MG Tablet Daily -Breakfast             Gabapentin 300 MG Capsule Three Times Daily - Breakfast, Lunch , Evening meals  Acute fill scheduled  for 08/09/20 for Losartan 50MG Tablet Daily and Fluoxetine 20 MG Capsule Three Capsules Daily   Patient declined (meds) last month due to PRN use/additional supply on hand.   Albuterol 108 MCG/ACT inhaler PRN - adequate supply              Albuterol 0.83% 25CT SOLN PRN  - Patient states he will get this from a company.             Buspirone 15 MG Tablet Two Times Daily - Breakfast, Evening meal- adequate supply             Nitroglycerin 0.4MG SL Tablet PRN -Adequate supply             Metformin HCI 500 MG Tablet Twice Daily - Breakfast, Evening Meal -adequate supply.             One Touch U-Soft Lancet 100 -Adequate supply              One touch Ult 100CT Test strip - Adequate Supply  Patient is due for next adherence delivery on: 08/27/2020. Called patient and reviewed medications and coordinated delivery.  This delivery to include:  None ID  Coordinated acute fill for (med) to be delivered (date).   Aripiprazole 5 mg Daily Breakfast to be delivered 08/27/2020  Patient declined the following medications (meds) due to (reason)  Isosorbide Mononitrate 30 MG Tablet Daily  -Breakfast -Adequate supply              Carvedilol 12.5 MG Twice Daily  - Breakfast,Evening Meals -Adequate supply              Amlodipine 5MG Tablet Daily  -Breakfast -Adequate supply              Furosemide  40 Mg Tablet Twice Daily - Breakfast, Evening Meals -Adequate supply              Lubiprostone 8 MCG Capsule Twice Daily - Breakfast, Evening Meals -Adequate supply              Eliquis 5 MG Tablets twice daily -Breakfast, Evening Meals -Adequate supply              Trelegy Ellipta 100-62.5-25 One  Puff Daily -Adequate supply               Pantoprazole 40 MG Tablet Daily -Breakfast -Adequate supply              Gabapentin 300 MG Capsule Three Times Daily - Breakfast, Lunch , Evening meals -Adequate supply    Albuterol 108 MCG/ACT inhaler PRN - adequate supply              Albuterol 0.83% 25CT SOLN PRN  - Patient states he will get this from a company.             Buspirone 15 MG Tablet Two Times Daily - Breakfast, Evening meal- adequate supply             Nitroglycerin 0.4MG SL Tablet PRN -Adequate supply             Metformin HCI 500 MG Tablet Twice Daily - Breakfast, Evening Meal -adequate supply.             One Touch U-Soft Lancet 100 -Adequate supply             One touch Ult 100CT Test strip - Adequate Supply   Losartan 50MG Tablet Daily   Fluoxetine 20 MG Capsule Three  Capsules Daily   Patient needs refills for None ID.  Confirmed delivery date of 08/27/2020, advised patient that pharmacy will contact them the morning of delivery.   CAT ASSESSMENT  Rank each of the following items on a scale of 0 to 5 (with 5 being most severe) Write a # 0-5 in each box  I never cough (0) > I cough all the time (5) 3  I have no phlegm (mucus) in my chest (0) > My chest is completely full of phlegm (mucus) (5) 5  My chest does not feel tight at all (0) > My chest feels very tight (5) 5  When I walk up a hill or one flight of stairs I am not breathless (0) > When I walk up a hill or one flight of stairs I am very breathless (5) 5  I am not limited doing any activities at home (0) > I am very limited doing activities at home (5) 5  I am confident leaving my home despite my lung function (0) > I am not  at all confident leaving my home because of my lung condition (5)  4  I sleep soundly (0) > I don't sleep soundly because of my lung condition (5) 3  I have lots of energy (0) > I have no energy at all (5) 2   Total CAT Score: 32  Patient states his blood pressure has been ranging around 141's/80-90's. Patient states his blood sugar has been ranging around 150's.  Patient reports he has concerns about his weight. Patient states when he stop smoking he was 174 pounds and now he is 190 pounds.  [place] Pharmacist Assistant [phone]

## 2020-08-27 ENCOUNTER — Other Ambulatory Visit: Payer: Self-pay | Admitting: *Deleted

## 2020-08-27 ENCOUNTER — Other Ambulatory Visit: Payer: Self-pay | Admitting: Family

## 2020-08-27 ENCOUNTER — Telehealth: Payer: Self-pay

## 2020-08-27 ENCOUNTER — Ambulatory Visit: Payer: Medicare Other

## 2020-08-27 ENCOUNTER — Telehealth: Payer: Self-pay | Admitting: Adult Health

## 2020-08-27 DIAGNOSIS — E119 Type 2 diabetes mellitus without complications: Secondary | ICD-10-CM

## 2020-08-27 DIAGNOSIS — E785 Hyperlipidemia, unspecified: Secondary | ICD-10-CM

## 2020-08-27 DIAGNOSIS — E1169 Type 2 diabetes mellitus with other specified complication: Secondary | ICD-10-CM

## 2020-08-27 DIAGNOSIS — J9611 Chronic respiratory failure with hypoxia: Secondary | ICD-10-CM

## 2020-08-27 MED ORDER — ROSUVASTATIN CALCIUM 20 MG PO TABS: 20.0000 mg | ORAL_TABLET | Freq: Every day | ORAL | 3 refills | Status: DC

## 2020-08-27 NOTE — Patient Instructions (Signed)
Visit Information It was great speaking with you today!  Please let me know if you have any questions about our visit. Goals Addressed            This Visit's Progress   . Chronic Care Management       CARE PLAN ENTRY (see longitudinal plan of care for additional care plan information)  Current Barriers:  . Chronic Disease Management support, education, and care coordination needs related to Hypertension, Hyperlipidemia, Diabetes, Atrial Fibrillation, Heart Failure, GERD, COPD, and Depression    Hypertension BP Readings from Last 3 Encounters:  06/21/20 (!) 173/84  06/17/20 (!) 159/84  06/01/20 (!) 148/62   . Pharmacist Clinical Goal(s): o Over the next 90 days, patient will work with PharmD and providers to achieve BP goal <130/80 . Current regimen:  . Amlodipine 5 mg daily  . Carvedilol 12.5 mg twice daily  . Furosemide 20 mg daily . Imdur 30 mg daily  . Losartan 100 mg daily . Interventions: o Discussed low salt diet and exercising as tolerated extensively o Will initiate blood pressure monitoring plan  . Patient self care activities - Over the next 90 days, patient will: o Check blood pressure 3-5 times weekly, document, and provide at future appointments o Ensure daily salt intake < 2300 mg/day  Hyperlipidemia Lab Results  Component Value Date/Time   LDLCALC 119 (H) 07/06/2018 01:55 PM   . Pharmacist Clinical Goal(s): o Over the next 90 days, patient will work with PharmD and providers to achieve LDL goal < 70 . Current regimen:  o None . Interventions: o Discussed low cholesterol diet and exercising as tolerated extensively o Will initiate cholesterol monitoring plan  o Recommend starting rosuvastatin 20 mg daily  Diabetes Lab Results  Component Value Date/Time   HGBA1C 6.4 (H) 04/13/2020 05:55 PM   HGBA1C 6.3 (H) 02/08/2020 10:41 PM   . Pharmacist Clinical Goal(s): o Over the next 90 days, patient will work with PharmD and providers to achieve A1c goal  <7% . Current regimen:  o Metformin 500 mg twice daily  . Interventions: o Discussed carbohydrate counting and exercising as tolerated extensively o Will initiate blood sugar monitoring plan  . Patient self care activities - Over the next 90 days, patient will: o Check blood sugar once daily, document, and provide at future appointments o Contact provider with any episodes of hypoglycemia  Medication management . Pharmacist Clinical Goal(s): o Over the next 90 days, patient will work with PharmD and providers to maintain optimal medication adherence . Current pharmacy: [facility] . Interventions o Comprehensive medication review performed. o Continue current medication management strategy . Patient self care activities - Over the next 90 days, patient will: o Take medications as prescribed o Report any questions or concerns to PharmD and/or provider(s)    . COMPLETED: Quit Smoking        The patient verbalized understanding of instructions, educational materials, and care plan provided today and agreed to receive a mailed copy of patient instructions, educational materials, and care plan.   Telephone follow up appointment with pharmacy team member scheduled for: 10/08/20 at 11:00 AM  Garey Ham Clinical Pharmacist [facility] at [facility]  [phone]

## 2020-08-27 NOTE — Progress Notes (Signed)
Bessie,  Please reach out to Gwenith Daily and inform him of the following:   I spoke with Tammy at the pulmonology clinic and she was in agreement about starting a prednisone course. She will be sending in a prescription for Prednisone 10mg  #20 and he is to take 4 tabs for 2 days, then 3 tabs for 2 days, 2 tabs for 2 days, then 1 tab for 2 days, then stop. Please ask him to contact the pulmonology office if his symptoms do not improve or worsen.   She also mentioned that he will need an office visit (not during an active flare) to evaluate his oxygen before they can help him get an daytime O2 tank.   I also spoke with the doctor covering for Dr. at [facility] and she was in agreement about starting rosuvastatin 20 mg daily for cholesterol.   I asked the pharmacy to deliver both of these medications for him tomorrow.   Please continue to touch base with Mr. Barabas on a 1-2 week basis to keep an eye on his breathing symptoms.   Thanks, Jodi Marble Clinical Pharmacist [facility] at [place]  [phone]

## 2020-08-27 NOTE — Patient Outreach (Addendum)
[facility]) Care Management  08/27/2020  Kenneth [name] 12-Oct-1960 [id number]   CSW spoke with pt who reports he is doing well.  Pt has had 2 counseling sessions (telephonic) and states they increased his Abilify from 2mg  to 5mg .  "I am more optimistic".  Pt also completed depression screening with CSW- scoring well and with much improvements.  "I have my sleep pattern backwards but I am getting 8-9 hours.  He his looking forward to the holidays and is reminded to call if needs arise prior to CSW follow up call; planned for 2-3 weeks.     Depression screen [facility] 2/9 08/27/2020 07/02/2020 04/24/2020 03/13/2020 02/15/2020  Decreased Interest 0 0 0 3 0  Down, Depressed, Hopeless 0 0 0 2 0  PHQ - 2 Score 0 0 0 5 0  Altered sleeping - - - 2 -  Tired, decreased energy - - - 3 -  Change in appetite - - - 1 -  Feeling bad or failure about yourself  - - - 0 -  Trouble concentrating - - - 3 -  Moving slowly or fidgety/restless - - - 3 -  Suicidal thoughts - - - 0 -  PHQ-9 Score - - - 17 -  Difficult doing work/chores - - - Very difficult -    02/17/2020, MSW, LCSW Clinical Social Worker  [facility] [phone]

## 2020-08-27 NOTE — Telephone Encounter (Signed)
-----   Message from Gaspar Cola, [facility] sent at 08/27/2020  4:05 PM EST -----   ----- Message ----- From: Julio Sicks, NP Sent: 08/27/2020   3:39 PM EST To: Gaspar Cola, RPH  I am fine with prednisone taper  Prednisone 10mg  #20 4 tabs for 2 days, then 3 tabs for 2 days, 2 tabs for 2 days, then 1 tab for 2 days, then stop   If not improving will need ov.   He will need an ov to evaluate for daytime O2 (not during flare ) .   If he needs ov we can set up in person or virtual instead of sending in meds.   Please contact office for sooner follow up if symptoms do not improve or worsen or seek emergency care    Thanks Tammy  ----- Message ----- From: , [facility] Sent: 08/27/2020  12:03 PM EST To: 14/02/2020, NP, Julio Sicks, MD  Dr. Leslye Peer, I spoke with your patient today for Chronic Care Management. Patient reports he has significant congestion and phlegm. COPD CAT Assessment on 12/3 showed a score of 32, so patient may be very high risk of an exacerbation. What would you think about having the patient start a prednisone course to potentially prevent an exacerbation? Also, patient mentioned he would be interested in getting a more portable oxygen device in the future as his current one is challenging to be mobile with.   Please let me know your thoughts so I may convey them to the patient, 14/3 Clinical Pharmacist [facility] at [facility]  [phone]

## 2020-08-27 NOTE — Telephone Encounter (Signed)
Message from pharmacy with evaluation today with increased COPD symptoms. Request a prednisone taper be sent into the pharmacy.  Call patient to discuss symptoms and decide on course of treatment No answer

## 2020-08-27 NOTE — Progress Notes (Signed)
Spoke with patient to inform him per clinical pharmacist:   Clinical pharamacist spoked with pulmonology clinic and Tammy was in agreement about starting a prednisone course. She will be sending in a prescription for Prednisone 10mg  #20 and he is to take 4 tabs for 2 days, then 3 tabs for 2 days, 2 tabs for 2 days, then 1 tab for 2 days, then stop. Please ask him to contact the pulmonology office if his symptoms do not improve or worsen.   She also mentioned that he will need an office visit (not during an active flare) to evaluate his oxygen before they can help him get an daytime O2 tank.   Clinical pharmacist also spoke with the doctor covering for Dr. at [facility] and she was in agreement about starting rosuvastatin 20 mg daily for cholesterol.   I asked the pharmacy to deliver both of these medications for him tomorrow.   Informed patient I will touch base with him on a 1-2 week basis to keep an eye on his breathing symptoms.   Patient verbalized understanding of instructions and agreed of the above.  [facility] Clinical Pharmacist Assistant [phone]

## 2020-08-27 NOTE — Progress Notes (Signed)
Tammy,  Thank you for getting back to me, I will inform the patient of the above. Would you be able to write prescription for the prednisone and send into into his pharmacy, [facility]?   Thank you for involving me in the care of your patient,  Garey Ham Clinical Pharmacist [facility] at [facility]  [phone]

## 2020-08-27 NOTE — Chronic Care Management (AMB) (Signed)
Chronic Care Management Pharmacy  Name: Kenneth [name]  MRN: [id number] DOB: 1960/11/27   Chief Complaint/ HPI  Kenneth [name],  59 y.o. , male presents for their Follow-Up CCM visit with the clinical pharmacist In office.  PCP : [name], MD Patient Care Team: [name], MD as PCP - General (Family Medicine) Buford Dresser, MD as PCP - Cardiology (Cardiology) Valente David, RN as [place] Management Germaine Pomfret, [facility] as Pharmacist (Pharmacist)  Their chronic conditions include: Hypertension, Hyperlipidemia, Diabetes, Atrial Fibrillation, Heart Failure, GERD, COPD, Depression and Tobacco use   Office Visits: 04/24/20: Patient presented to Dr. Ethelene Hal for hospital follow-up. Patient started on Gabapentin for neuropathy. BP in clinic 150/78, patient started on losartan 50 mg daily.  03/22/20: Patient presented to [name], NP for constipation. Patient does not have adequate fiber or water intake. Patient started on Lubiprostone 8 mcg twice daily.   Consult Visit: 07/12/20: Patient presented to Dr. Royston Cowper (Pulmonology) for follow-up. Patient with dry wheezing, but no evidence of exacerbation. 07/05/20: Patient presented to Dr. Harrell Gave (Cardiology) for follow-up. BP in clinic 130/70. Continue losartan 50 mg daily, Imdur decreased to 30 mg daily.  06/01/20: Patient presented to Rexene Edison, NP (Pulmonology) for COPD follow-up. Patient still with symptoms of exacerbation. Doxycycline and prednisone course extended.  05/24/20: Patient presented to Rexene Edison, NP (Pulmonology) for COPD follow-up. Patient started on Doxycycline and Prednisone.  05/04/20: Patient presented to Dr. Harrell Gave (Cardiology) for follow-up. Euvolemic, EF improved. Discussed Ferne Coe with patient.  05/02/20: Patient presented to Dr. Lamonte Sakai (Pulmonology) for COPD with acute exacerbation. Patient wondering if CPAP with oxygen would help him.   7/23-7/26/21: Patient hospitalized for COPD exacerbation 7/16-7/18/21: Patient hospitalized for COPD exacerbation   Allergies  Allergen Reactions  . Lisinopril Swelling  . Other Other (See Comments)    Lettuce : rash  . Tomato Rash    Medications: Outpatient Encounter Medications as of 08/27/2020  Medication Sig Note  . acetaminophen (TYLENOL) 325 MG tablet Take 650 mg by mouth every 6 (six) hours as needed for mild pain or headache.   . albuterol (PROVENTIL HFA;VENTOLIN HFA) 108 (90 Base) MCG/ACT inhaler Inhale 1-2 puffs into the lungs every 4 (four) hours as needed for wheezing or shortness of breath.    [name] albuterol (PROVENTIL) (2.5 MG/3ML) 0.083% nebulizer solution INHALE 1 VIAL VIA NEBULIZER 5 TIMES DAILY AS NEEDED FOR WHEEZING OR FOR SHORTNESS OF BREATH (Patient taking differently: Take 2.5 mg by nebulization 5 (five) times daily as needed for wheezing or shortness of breath. ) 06/21/2020: 10 day supply Last filled 06/11/20  . amLODipine (NORVASC) 5 MG tablet Take 1 tablet (5 mg total) by mouth daily.   [name] apixaban (ELIQUIS) 5 MG TABS tablet Take 1 tablet (5 mg total) by mouth 2 (two) times daily.   . ARIPiprazole (ABILIFY) 2 MG tablet Take 2 mg by mouth daily.   . Blood Glucose Monitoring Suppl (ONE TOUCH ULTRA 2) w/Device KIT Use to test blood sugars 1-2 times daily.   . busPIRone (BUSPAR) 15 MG tablet Take 1 tablet (15 mg total) by mouth 2 (two) times daily. 06/21/2020: 30 day supply Last filled 06/11/20  . carvedilol (COREG) 12.5 MG tablet Take 1 tablet (12.5 mg total) by mouth 2 (two) times daily with a meal. 06/21/2020: 30 day supply Last filled 06/11/20  . famotidine (PEPCID) 20 MG tablet TAKE 1 TABLET BY MOUTH  DAILY (Patient not taking: Reported on 07/12/2020) 06/21/2020: 90 day supply  Last filled 01/31/20  . FLUoxetine (PROZAC) 20 MG capsule TAKE 3 CAPSULES BY MOUTH  DAILY 06/21/2020: 90 day supply Last filled 01/07/20  . Fluticasone-Umeclidin-Vilant (TRELEGY ELLIPTA) 100-62.5-25  MCG/INH AEPB Inhale 1 puff into the lungs daily. 06/21/2020: 30 day supply Last filled 06/14/20  . furosemide (LASIX) 40 MG tablet Take 1 tablet (40 mg total) by mouth 2 (two) times daily.   [name] gabapentin (NEURONTIN) 300 MG capsule TAKE ONE CAPSULE BY MOUTH THREE TIMES DAILY   . glucose blood (ONETOUCH ULTRA) test strip USE TO TEST BLOOD SUGAR 1-2 TIMES DAILY   . ipratropium (ATROVENT) 0.02 % nebulizer solution Take 2.5 mLs (0.5 mg total) by nebulization 4 (four) times daily. 06/21/2020: 6 day supply Last filled 06/11/20  . ipratropium-albuterol (DUONEB) 0.5-2.5 (3) MG/3ML SOLN Take 3 mLs by nebulization every 6 (six) hours as needed.   . isosorbide mononitrate (IMDUR) 30 MG 24 hr tablet Take 30 mg by mouth daily.   . Lancets (ONETOUCH ULTRASOFT) lancets USE TO TEST BLOOD SUGAR 1-2 TIMES DAILY   . losartan (COZAAR) 100 MG tablet Take 1 tablet (100 mg total) by mouth daily.   [name] lubiprostone ([name]) 8 MCG capsule TAKE 1 CAPSULE(8 MCG) BY MOUTH TWICE DAILY WITH A MEAL (Patient taking differently: Take 8 mcg by mouth 2 (two) times daily with a meal. ) 06/21/2020: 15 day supply Last filled 06/11/20  . metFORMIN (GLUCOPHAGE) 500 MG tablet Take 1 tablet (500 mg total) by mouth 2 (two) times daily with a meal.   . Multiple Vitamin (MULTIVITAMIN WITH MINERALS) TABS tablet Take 1 tablet by mouth daily.   . nitroGLYCERIN (NITROSTAT) 0.4 MG SL tablet DISSOLVE 1 TABLET UNDER THE TONGUE EVERY 5 MINUTES AS  NEEDED FOR CHEST PAIN. MAX  OF 3 TABLETS IN 15 MINUTES. CALL 911 IF PAIN PERSISTS.   [name] pantoprazole (PROTONIX) 40 MG tablet TAKE ONE TABLET BY MOUTH EVERY MORNING   . traZODone (DESYREL) 50 MG tablet TAKE 1/2 TO 1 TABLET BY  MOUTH AT BEDTIME AS NEEDED  FOR SLEEP.    No facility-administered encounter medications on file as of 08/27/2020.    Wt Readings from Last 3 Encounters:  07/12/20 188 lb 6.4 oz (85.5 kg)  07/05/20 190 lb 12.8 oz (86.5 kg)  07/02/20 190 lb (86.2 kg)    Current  Diagnosis/Assessment:    Goals Addressed   None     AFIB   Managed by Dr. Harrell Gave  Patient is currently rate controlled.  Patient has failed these meds in past: n/a Patient is currently controlled on the following  medications:  . Carvedilol 12.5 mg twice daily  . Eliquis 5 mg twice daily   We discussed:  diet and exercise extensively. Patient denies unusual bruising/bleeding with Eliquis.   Plan  Continue current medications  Heart Failure   Managed by Dr. Harrell Gave  Type: Combined Systolic and Diastolic  Last ejection fraction: 45-50% (04/07/20) NYHA Class: II (slight limitation of activity) AHA HF Stage: B (Heart disease present - no symptoms present)  Patient has failed these meds in past: Lisinopril (Angioedema) Patient is currently controlled on the following  medications:  . Amlodipine 5 mg daily  . Carvedilol 12.5 mg twice daily  . Furosemide 40 mg twice daily . Imdur 30 mg daily  . Losartan 100 mg daily  We discussed: Patient's weight continues to increase over the past two months, which patient attributes to replacing his cigarette habit with snacking. Discussed healthy alternatives when snacking, and trying to minimize snacking  when bored or stressed.   Plan  Recommend starting Jardiance 10 mg daily. Given current COPD exacerbations will defer until later visit.   Hypertension   BP goal is:  <130/80  Office blood pressures are  BP Readings from Last 3 Encounters:  07/12/20 130/80  07/05/20 130/70  07/02/20 138/80   CMP Latest Ref Rng & Units 07/05/2020 06/22/2020 06/20/2020  Glucose 65 - 99 mg/dL 170(H) 259(H) 221(H)  BUN 6 - 24 mg/dL 9 24(H) 13  Creatinine 0.76 - 1.27 mg/dL 1.10 1.34(H) 1.36(H)  Sodium 134 - 144 mmol/L 134 135 137  Potassium 3.5 - 5.2 mmol/L 4.4 4.5 3.6  Chloride 96 - 106 mmol/L 94(L) 96(L) 99  CO2 20 - 29 mmol/L _0 Calcium 8.7 - 10.2 mg/dL 8.9 9.3 8.9  Total Protein 6.5 - 8.1 g/dL - - -  Total Bilirubin  0.3 - 1.2 mg/dL - - -  Alkaline Phos 38 - 126 U/L - - -  AST 15 - 41 U/L - - -  ALT 0 - 44 U/L - - -   Patient checks BP at home 3-5x per week Patient home BP readings are ranging: 140s/80-90s. On 08/25/20 it was 149/85.   Patient has failed these meds in the past: n/a Patient is currently uncontrolled on the following medications:  . Amlodipine 5 mg daily (AM)  . Carvedilol 12.5 mg twice daily  . Furosemide 20 mg daily . Imdur 30 mg daily  . Losartan 100 mg daily   We discussed diet and exercise extensively.   Plan  Continue current medications.  Recommend BMP at Cardiology follow-up on 12/15.   Diabetes   A1c goal <7%  Recent Relevant Labs: Lab Results  Component Value Date/Time   HGBA1C 6.8 (H) 06/21/2020 09:14 AM   HGBA1C 6.4 (H) 04/13/2020 05:55 PM   GFR 88.57 02/14/2020 08:54 AM   GFR 66.86 05/20/2019 08:25 AM   MICROALBUR <0.7 07/06/2018 01:55 PM    Last diabetic Eye exam: No results found for: HMDIABEYEEXA  Last diabetic Foot exam: No results found for: HMDIABFOOTEX   Checking BG: Daily  Patient has failed these meds in past: n/a Patient is currently controlled on the following medications: [name] Metformin 500 mg twice daily   We discussed: diet and exercise extensively. A1c at goal, but recent fasting BG elevated. Patient has been skipping evening doses of his metformin after checking his blood sugars. Counseled patient on the fasting goal range and the importance of strict adherence to both doses of metformin to maintain good blood sugar control.   Plan  Continue current medications  Hyperlipidemia   LDL goal < 70  Lipid Panel     Component Value Date/Time   CHOL 226 (H) 07/06/2018 1355   TRIG 190.0 (H) 07/06/2018 1355   HDL 68.40 07/06/2018 1355   LDLCALC 119 (H) 07/06/2018 1355    Hepatic Function Latest Ref Rng & Units 06/17/2020 04/14/2020 04/13/2020  Total Protein 6.5 - 8.1 g/dL 6.5 5.4(L) 6.1(L)  Albumin 3.5 - 5.0 g/dL 3.7 3.0(L) 3.5  AST 15 -  41 U/L 21 18 37  ALT 0 - 44 U/L 17 33 41  Alk Phosphatase 38 - 126 U/L 54 41 50  Total Bilirubin 0.3 - 1.2 mg/dL 0.7 0.4 0.5     The 10-year ASCVD risk score Mikey Bussing DC Jr., et al., 2013) is: 35.8%   Values used to calculate the score:     Age: 16 years     Sex:  Male     Is Non-Hispanic African American: Yes     Diabetic: Yes     Tobacco smoker: Yes     Systolic Blood Pressure: 798 mmHg     Is BP treated: Yes     HDL Cholesterol: 68.4 mg/dL     Total Cholesterol: 226 mg/dL   Patient has failed these meds in past: n/a Patient is currently uncontrolled on the following medications:  . None  We discussed: Discussed the importance of statin therapy, patient was amenable to this change.    Plan  Recommend starting rosuvastatin 20 mg daily.  Recommend Fasting Lipid Panel   COPD   Managed by Dr. Lamonte Sakai  Last spirometry score: FEV1 25% (05/02/20)  Gold Grade: Gold 4 (FEV1<40%) Current COPD Classification:  D (high sx, >/=2 exacerbations/yr)  Eosinophil count:   Lab Results  Component Value Date/Time   EOSPCT 3 06/20/2020 03:00 PM  %                               Eos (Absolute):  Lab Results  Component Value Date/Time   EOSABS 0.2 06/20/2020 03:00 PM    Tobacco Status:  Social History   Tobacco Use  Smoking Status Former Smoker  . Packs/day: 2.00  . Years: 30.00  . Pack years: 60.00  . Types: Cigarettes  . Quit date: 05/01/2019  . Years since quitting: 1.3  Smokeless Tobacco Never Used  Tobacco Comment   smokes 2-3cigs per day   Patient has failed these meds in past: n/a Patient is currently uncontrolled on the following  medications:  . Proventil HFA  . Albuterol 0.083% Neb . Atrovent 0.02% Neb . Mucinex 600 mg daily  . Trelegy 100-62.5-25 1 puff daily   Using maintenance inhaler regularly? Yes Frequency of rescue inhaler use:  1-2x per week. Uses Nebulizer 3-4 times daily. Once daily.   We discussed: Oxygen technician checked machine over the weekend.  Patient has significant congestion and phlegm.   Plan  Continue current medications  Recommend prednisone burst.    Depression / Anxiety   PHQ9 Score:  PHQ9 SCORE ONLY 07/02/2020 04/24/2020 03/13/2020  PHQ-9 Total Score 0 0 17   GAD7 Score: GAD 7 : Generalized Anxiety Score 05/20/2019 11/15/2018 07/20/2018 07/06/2018  Nervous, Anxious, on Edge _0 Control/stop worrying _1 Worry too much - different things _2 Trouble relaxing _3 Restless _4 Easily annoyed or irritable _5 Afraid - awful might happen 1 0 1 1  Total GAD 7 Score _6 Patient has failed these meds in past: n/a Patient is currently controlled on the following medications:  . Buspirone 15 mg twice daily  . Fluoxetine 20 mg 3 caps daily  We discussed:  Patient interested in psychiatry referral  Plan  Continue current medications  GERD   Patient reports dysphagia or heartburn. Expresses understanding to avoid triggers such as fatty foods and large meals.  Currently controlled on: [name] Famotidine 20 mg daily  . Pantoprazole 40 mg daily  . Good Sense Antacid chewable  Plan   Continue current medication.   Misc / OTC    . APAP 325 2 tabs q6hr PRN . Diphenhydramine 25 mg 2 tablet q6hr PRN  . Gabapentin 300 mg TID -Neuropathy in Feet (not as irritating)  .  Lubiprostone 8 mcg twice daily . Multivitamin daily . Nitroglycerin 0.4 mg PRN  . Trazodone 50 mg QHS PRN   We discussed:   Plan  Continue current medications  Vaccines   Reviewed and discussed patient's vaccination history.    Immunization History  Administered Date(s) Administered  . Influenza,inj,Quad PF,6+ Mos 06/25/2018, 05/20/2019  . Influenza-Unspecified 06/28/2020  . PFIZER SARS-COV-2 Vaccination 12/22/2019, 01/16/2020  . Pneumococcal Polysaccharide-23 06/08/2016  . Zoster Recombinat (Shingrix) 07/22/2019    Medication Management   [facility] uses [facility] for all medications  Reviewed  patient's [facility] medication and Epic medication profile assuring there are no discrepancies or gaps in therapy. Confirmed all fill dates appropriate and verified with patient that there is a sufficient quantity of all prescribed medications at home. Informed patient to call me any time if needing medications before scheduled deliveries. The anticipated medication sync date is 09/08/20.  Follow up: 1 month phone visit  [place] Pharmacist [facility]  [phone]

## 2020-08-28 NOTE — Telephone Encounter (Signed)
Patient states having symptoms of congestion, cough, mucus and sore throat. Pharmacy is [facility] [place]. Patient phone number is [phone].

## 2020-08-28 NOTE — Telephone Encounter (Signed)
lmtcb for pt.  

## 2020-08-29 ENCOUNTER — Other Ambulatory Visit: Payer: Self-pay | Admitting: *Deleted

## 2020-08-29 ENCOUNTER — Telehealth: Payer: Self-pay

## 2020-08-29 MED ORDER — PREDNISONE 10 MG PO TABS: ORAL_TABLET | ORAL | 0 refills | Status: DC

## 2020-08-29 MED ORDER — DOXYCYCLINE HYCLATE 100 MG PO TABS: 100.0000 mg | ORAL_TABLET | Freq: Two times a day (BID) | ORAL | 0 refills | Status: DC

## 2020-08-29 NOTE — Telephone Encounter (Signed)
Patient is returning phone call. Patient phone number is [phone].

## 2020-08-29 NOTE — Telephone Encounter (Signed)
RB patient last seen 10/21/2021for COPD  Spoke to patient, who reports of  productive cough with green sputum, wheezing, increased sob, chest congestion and sore throat. Sx have been present for 1wk. He has had both covid vaccines. schedule for booster tomorrow.  He is using albuterol solution 3-4x daily, albuterol HFA 3-4x daily and trelegy once daily. Denied F/C/S. Taking mucinex 1200 and tylenol sinus and cold with no relief.  First available appt with NP is 09/03/2020. He would like prednisone taper.   Dr. Delton Coombes, please advise. Thanks

## 2020-08-29 NOTE — Progress Notes (Signed)
Left a voice message to inform patient to call Tammy his pulmonologist .  Everlean Cherry Clinical Pharmacist Assistant [phone]

## 2020-08-29 NOTE — Telephone Encounter (Signed)
He should not get booster tomorrow if acutely ill. Because we do not have any apt open until next week I will send in abx and prednisone for suspected COPD exacerbation. He should follow-up in 1 week.

## 2020-08-29 NOTE — Telephone Encounter (Signed)
Message was not addressed by 5:00. Routing to App of the day.

## 2020-08-29 NOTE — Patient Outreach (Signed)
[facility]) Care Management  08/29/2020  Jan Olano Aug 08, 1961 [id number]   Call placed to member, no answer, HIPAA compliant voice message left.  Will follow up within the next 3-4 business days.  [name], [place], MSN [facility] Care Management  [facility] Manager [phone]

## 2020-08-29 NOTE — Telephone Encounter (Signed)
08/29/20   Called patient.  Left voicemail.  Instructed patient to contact our office to schedule a an office evaluation.  Per documentation this is the third attempt.  We will close per triage protocol, and send letter.   We will route to Maryfrances Bunnell, RN to send triage letter.  Elisha Headland, FNP

## 2020-08-29 NOTE — Telephone Encounter (Signed)
Called and spoke with pt letting him know the info stated by [facility] and he verbalized understanding. appt has been scheduled for pt next Wed. 12/15 to reassess. Nothing further needed.

## 2020-08-30 ENCOUNTER — Encounter (INDEPENDENT_AMBULATORY_CARE_PROVIDER_SITE_OTHER): Payer: Self-pay

## 2020-08-30 DIAGNOSIS — J449 Chronic obstructive pulmonary disease, unspecified: Secondary | ICD-10-CM | POA: Diagnosis not present

## 2020-09-03 ENCOUNTER — Telehealth: Payer: Self-pay

## 2020-09-03 DIAGNOSIS — R062 Wheezing: Secondary | ICD-10-CM | POA: Diagnosis not present

## 2020-09-03 DIAGNOSIS — R6889 Other general symptoms and signs: Secondary | ICD-10-CM | POA: Diagnosis not present

## 2020-09-03 DIAGNOSIS — R0602 Shortness of breath: Secondary | ICD-10-CM | POA: Diagnosis not present

## 2020-09-03 DIAGNOSIS — R059 Cough, unspecified: Secondary | ICD-10-CM | POA: Diagnosis not present

## 2020-09-03 DIAGNOSIS — Z743 Need for continuous supervision: Secondary | ICD-10-CM | POA: Diagnosis not present

## 2020-09-03 NOTE — Progress Notes (Addendum)
Patient states he has not been feeling good since he started his rosuvastatin 20 MG. Patient states he felt like his stomach was full and irregular.   I will inform Clinical pharmacist of above.  Everlean Cherry Clinical Pharmacist Assistant [phone]   Addendum 09/04/20:   Sherron Monday with patient, patient was concerned about recent symptoms of shortness of breath, feeling overly full, and some agitation that he wanted to know was related to his newly started rosuvastatin. Assured patient that these symptoms are not commonly associated with rosuvastatin therapy, and may be more related to his recent exacerbation and predisone burst. His satiety changes may also be related to his diabetes but unsure at this time. Encouraged patient to discuss these symptoms at his visits with cardiology and pulmonology tomorrow.   Patient ok with continuing rosuvastatin and will continue to monitor with patient to ensure tolerability and assess side effects  Garey Ham Clinical Pharmacist [facility] at [facility]  [phone]

## 2020-09-03 NOTE — Telephone Encounter (Signed)
Patient returning a call from [place].  Please call pt back.  CB#  [phone]

## 2020-09-04 ENCOUNTER — Other Ambulatory Visit: Payer: Self-pay | Admitting: *Deleted

## 2020-09-04 ENCOUNTER — Telehealth: Payer: Self-pay

## 2020-09-04 NOTE — Patient Outreach (Signed)
[facility]) Care Management  09/04/2020  Kenneth [name] [birth year] [id number]   Outreach attempt #2 placed to member, successful.  He report he is still having episodes of increased cough and secretions, had to call EMS out to the home yesterday.  His oxygen levels have remained greater then 90%, shortness of breath relieved with inhalers and nebulizers.  He did not go to ED yesterday, state he started to feel better after treatment and rest.  Report he has had increase in weight over the past couple months, was 188 pounds, now at 200 pounds.  Does not feel this is related to fluid but rather increase in appetite.  He has appointments with cardiology and pulmonology on 12/15, significant other will provide transportation.  Denies any urgent concerns, encouraged to contact this care manager with questions. Will follow up within the next month.  Goals Addressed            This Visit's Progress   . [facility] - Track and Manage My Symptoms   On track    Timeframe:  Short-Term Goal Priority:  Medium Start Date:     09/04/2020                        Expected End Date:    10/05/2020                     - develop a rescue plan - keep follow-up appointments    Why is this important?   Tracking your symptoms and other information about your health helps your doctor plan your care.  Write down the symptoms, the time of day, what you were doing and what medicine you are taking.  You will soon learn how to manage your symptoms.     Notes:   11/10 - reminded of appointments next week  12/14 - Reviewed upcoming appointments with Cardiology and pulmonology    . [facility] - Track and Manage My Triggers   On track    Timeframe:  Long-Range Goal Priority:  High Start Date:        09/04/2020                     Expected End Date:         11/05/2020                - eliminate smoking in my home - identify and remove indoor air pollutants - listen for public air quality announcements every  day    Why is this important?   Triggers are activities or things, like tobacco smoke or cold weather, that make your COPD (chronic obstructive pulmonary disease) flare-up.  Knowing these triggers helps you plan how to stay away from them.  When you cannot remove them, you can learn how to manage them.     Notes:   11/10 - Reviewed use of inhalers and oxygen  12/14 - Reviewed COPD action plan, encouraged to contact provider office with concerns      [name], RN, MSN [facility] Care Management  [facility] Manager [phone]

## 2020-09-04 NOTE — Telephone Encounter (Signed)
Pt returning Alex's phone call from yesterday.  I informed pt that I would send this message to [facility].  CB#[phone]

## 2020-09-04 NOTE — Telephone Encounter (Signed)
Spoke with patient, patient was concerned about recent symptoms of shortness of breath, feeling overly full, and some agitation that he wanted to know was related to his newly started rosuvastatin. Assured patient that these symptoms are not commonly associated with rosuvastatin therapy, and may be more related to his recent exacerbation and predisone burst. His satiety changes may also be related to his diabetes but unsure at this time. Encouraged patient to discuss these symptoms at his visits with cardiology and pulmonology tomorrow.   Garey Ham Clinical Pharmacist [facility] at [facility]  [phone]

## 2020-09-05 ENCOUNTER — Ambulatory Visit (INDEPENDENT_AMBULATORY_CARE_PROVIDER_SITE_OTHER): Payer: Medicare Other | Admitting: Adult Health

## 2020-09-05 ENCOUNTER — Ambulatory Visit (INDEPENDENT_AMBULATORY_CARE_PROVIDER_SITE_OTHER): Payer: Medicare Other | Admitting: Cardiology

## 2020-09-05 ENCOUNTER — Encounter: Payer: Self-pay | Admitting: Adult Health

## 2020-09-05 ENCOUNTER — Encounter: Payer: Self-pay | Admitting: Cardiology

## 2020-09-05 ENCOUNTER — Other Ambulatory Visit: Payer: Self-pay

## 2020-09-05 VITALS — BP 146/76 | HR 74 | Temp 97.3°F | Ht 72.0 in | Wt 201.0 lb

## 2020-09-05 VITALS — BP 116/80 | HR 66 | Ht 72.0 in | Wt 203.2 lb

## 2020-09-05 DIAGNOSIS — I10 Essential (primary) hypertension: Secondary | ICD-10-CM

## 2020-09-05 DIAGNOSIS — G4733 Obstructive sleep apnea (adult) (pediatric): Secondary | ICD-10-CM | POA: Diagnosis not present

## 2020-09-05 DIAGNOSIS — J9611 Chronic respiratory failure with hypoxia: Secondary | ICD-10-CM | POA: Diagnosis not present

## 2020-09-05 DIAGNOSIS — J449 Chronic obstructive pulmonary disease, unspecified: Secondary | ICD-10-CM | POA: Diagnosis not present

## 2020-09-05 DIAGNOSIS — I5042 Chronic combined systolic (congestive) and diastolic (congestive) heart failure: Secondary | ICD-10-CM

## 2020-09-05 DIAGNOSIS — I480 Paroxysmal atrial fibrillation: Secondary | ICD-10-CM

## 2020-09-05 NOTE — Progress Notes (Signed)
@Patient  ID: Kenneth [name], male    DOB: [birth year], 59 y.o.   MRN: [id number]  Chief Complaint  Patient presents with  . Follow-up    Referring provider: [name]  HPI: 59 year old male former smoker followed for COPD with asthma, obstructive sleep apnea on nocturnal CPAP Medical history significant for A. fib on chronic anticoagulation, diabetes, hypertension, congestive heart failure  TEST/EVENTS :  Pulmonary function testing May 02, 2020 showed FEV1 at 25%, ratio 42, FVC 48%, no significant bronchodilator response., Severe mid flow obstruction with reversibility, DLCO 50%.  2D echo April 07, 2020 EF 45 to 50%, normal pulmonary artery systolic pressure  55/73/2202 Follow up ; COPD Patient returns for a follow-up from recent COPD exacerbation.  Patient called in with increased cough congestion.  Was called in doxycycline and a prednisone taper.  Patient says he is feeling better with decreased cough. Says he is feeling better.  Remains on Trelegy daily.  Patient is on CPAP at bedtime.  Patient says he is doing well.  No issues with his CPAP.  Feels rested.  Patient remains on oxygen 2 L.  Patient would like an order for a portable concentrator.  Feels that this will help him be more independent.   Allergies  Allergen Reactions  . Lisinopril Swelling  . Other Other (See Comments)    Lettuce : rash  . Tomato Rash    Immunization History  Administered Date(s) Administered  . Influenza,inj,Quad PF,6+ Mos 06/25/2018, 05/20/2019  . Influenza-Unspecified 06/28/2020  . PFIZER SARS-COV-2 Vaccination 12/22/2019, 01/16/2020  . Pneumococcal Polysaccharide-23 06/08/2016  . Zoster Recombinat (Shingrix) 07/22/2019    Past Medical History:  Diagnosis Date  . Anxiety   . Arthritis   . Asthma   . Atrial fibrillation ([place])   . CHF (congestive heart failure) ([place])   . COPD (chronic obstructive pulmonary disease) ([place])   . Depression   . Diabetes mellitus  without complication ([place])   . Emphysema of lung ([place])   . Heart murmur   . Hyperlipidemia   . Hypertension   . Sleep apnea with use of continuous positive airway pressure (CPAP)     Tobacco History: Social History   Tobacco Use  Smoking Status Former Smoker  . Packs/day: 2.00  . Years: 30.00  . Pack years: 60.00  . Types: Cigarettes  . Quit date: 05/01/2019  . Years since quitting: 1.3  Smokeless Tobacco Never Used  Tobacco Comment   smokes 2-3cigs per day   Counseling given: Not Answered Comment: smokes 2-3cigs per day   Outpatient Medications Prior to Visit  Medication Sig Dispense Refill  . acetaminophen (TYLENOL) 325 MG tablet Take 650 mg by mouth every 6 (six) hours as needed for mild pain or headache.    . albuterol (PROVENTIL HFA;VENTOLIN HFA) 108 (90 Base) MCG/ACT inhaler Inhale 1-2 puffs into the lungs every 4 (four) hours as needed for wheezing or shortness of breath.     [name] albuterol (PROVENTIL) (2.5 MG/3ML) 0.083% nebulizer solution INHALE 1 VIAL VIA NEBULIZER 5 TIMES DAILY AS NEEDED FOR WHEEZING OR FOR SHORTNESS OF BREATH 150 mL 1  . amLODipine (NORVASC) 5 MG tablet Take 1 tablet (5 mg total) by mouth daily. 90 tablet 3  . apixaban (ELIQUIS) 5 MG TABS tablet Take 1 tablet (5 mg total) by mouth 2 (two) times daily. 180 tablet 1  . ARIPiprazole (ABILIFY) 5 MG tablet Take 5 mg by mouth daily.    . Blood Glucose Monitoring Suppl (ONE TOUCH ULTRA  2) w/Device KIT Use to test blood sugars 1-2 times daily. 1 kit 0  . busPIRone (BUSPAR) 15 MG tablet Take 1 tablet (15 mg total) by mouth 2 (two) times daily. 180 tablet 1  . carvedilol (COREG) 12.5 MG tablet Take 1 tablet (12.5 mg total) by mouth 2 (two) times daily with a meal. 180 tablet 3  . famotidine (PEPCID) 20 MG tablet Take 20 mg by mouth daily.    [name] FLUoxetine (PROZAC) 20 MG capsule TAKE 3 CAPSULES BY MOUTH  DAILY 270 capsule 3  . Fluticasone-Umeclidin-Vilant (TRELEGY ELLIPTA) 100-62.5-25 MCG/INH AEPB Inhale 1 puff  into the lungs daily. 60 each 5  . furosemide (LASIX) 40 MG tablet Take 1 tablet (40 mg total) by mouth 2 (two) times daily. 60 tablet 11  . gabapentin (NEURONTIN) 300 MG capsule TAKE ONE CAPSULE BY MOUTH THREE TIMES DAILY 90 capsule 2  . glucose blood (ONETOUCH ULTRA) test strip USE TO TEST BLOOD SUGAR 1-2 TIMES DAILY 100 each 11  . ipratropium (ATROVENT) 0.02 % nebulizer solution Take 2.5 mLs (0.5 mg total) by nebulization 4 (four) times daily. 75 mL 12  . ipratropium-albuterol (DUONEB) 0.5-2.5 (3) MG/3ML SOLN Take 3 mLs by nebulization every 6 (six) hours as needed. 360 mL 0  . isosorbide mononitrate (IMDUR) 30 MG 24 hr tablet Take 30 mg by mouth daily.    . Lancets (ONETOUCH ULTRASOFT) lancets USE TO TEST BLOOD SUGAR 1-2 TIMES DAILY 100 each 11  . losartan (COZAAR) 100 MG tablet Take 1 tablet (100 mg total) by mouth daily. 90 tablet 3  . lubiprostone ([name]) 8 MCG capsule TAKE 1 CAPSULE(8 MCG) BY MOUTH TWICE DAILY WITH A MEAL 60 capsule 8  . metFORMIN (GLUCOPHAGE) 500 MG tablet Take 1 tablet (500 mg total) by mouth 2 (two) times daily with a meal. 180 tablet 1  . Multiple Vitamin (MULTIVITAMIN WITH MINERALS) TABS tablet Take 1 tablet by mouth daily.    . nitroGLYCERIN (NITROSTAT) 0.4 MG SL tablet DISSOLVE 1 TABLET UNDER THE TONGUE EVERY 5 MINUTES AS&nbsp;&nbsp;NEEDED FOR CHEST PAIN. MAX&nbsp;&nbsp;OF 3 TABLETS IN 15 MINUTES. CALL 911 IF PAIN PERSISTS. 75 tablet 4  . pantoprazole (PROTONIX) 40 MG tablet TAKE ONE TABLET BY MOUTH EVERY MORNING 30 tablet 4  . rosuvastatin (CRESTOR) 20 MG tablet Take 1 tablet (20 mg total) by mouth daily. 30 tablet 3  . traZODone (DESYREL) 50 MG tablet TAKE 1/2 TO 1 TABLET BY  MOUTH AT BEDTIME AS NEEDED  FOR SLEEP. 30 tablet 11  . WIXELA INHUB 250-50 MCG/DOSE AEPB Inhale 1 puff into the lungs 2 (two) times daily.    [name] doxycycline (VIBRA-TABS) 100 MG tablet Take 1 tablet (100 mg total) by mouth 2 (two) times daily. (Patient not taking: Reported on 09/05/2020) 14  tablet 0   No facility-administered medications prior to visit.     Review of Systems:   Constitutional:   No  weight loss, night sweats,  Fevers, chills, +fatigue, or  lassitude.  HEENT:   No headaches,  Difficulty swallowing,  Tooth/dental problems, or  Sore throat,                No sneezing, itching, ear ache, nasal congestion, post nasal drip,   CV:  No chest pain,  Orthopnea, PND, swelling in lower extremities, anasarca, dizziness, palpitations, syncope.   GI  No heartburn, indigestion, abdominal pain, nausea, vomiting, diarrhea, change in bowel habits, loss of appetite, bloody stools.   Resp: .  No chest wall deformity  Skin: no rash or lesions.  GU: no dysuria, change in color of urine, no urgency or frequency.  No flank pain, no hematuria   MS:  No joint pain or swelling.  No decreased range of motion.  No back pain.    Physical Exam  BP (!) 146/76 (BP Location: Left Arm, Cuff Size: Normal)   Pulse 74   Temp (!) 97.3 F (36.3 C) (Temporal)   Ht 6' (1.829 m)   Wt 201 lb (91.2 kg)   SpO2 99%   BMI 27.26 kg/m   GEN: A/Ox3; pleasant , NAD, well nourished    HEENT:  [place]/AT,  NOSE-clear, THROAT-clear, no lesions, no postnasal drip or exudate noted.   NECK:  Supple w/ fair ROM; no JVD; normal carotid impulses w/o bruits; no thyromegaly or nodules palpated; no lymphadenopathy.    RESP  Clear  P & A; w/o, wheezes/ rales/ or rhonchi. no accessory muscle use, no dullness to percussion  CARD:  RRR, no m/r/g, no peripheral edema, pulses intact, no cyanosis or clubbing.  GI:   Soft & nt; nml bowel sounds; no organomegaly or masses detected.   Musco: Warm bil, no deformities or joint swelling noted.   Neuro: alert, no focal deficits noted.    Skin: Warm, no lesions or rashes    Lab Results:   BNP  ProBNP No results found for: PROBNP  Imaging: No results found.    PFT Results Latest Ref Rng & Units 05/02/2020  FVC-Pre L 1.90  FVC-Predicted Pre % 45   FVC-Post L 2.06  FVC-Predicted Post % 48  Pre FEV1/FVC % % 43  Post FEV1/FCV % % 42  FEV1-Pre L 0.81  FEV1-Predicted Pre % 24  FEV1-Post L 0.86  DLCO uncorrected ml/min/mmHg 14.61  DLCO UNC% % 50  DLCO corrected ml/min/mmHg 14.61  DLCO COR %Predicted % 50  DLVA Predicted % 72  TLC L 7.93  TLC % Predicted % 110  RV % Predicted % 220    No results found for: NITRICOXIDE      Assessment & Plan:   No problem-specific Assessment & Plan notes found for this encounter.     Rexene Edison, NP 09/05/2020

## 2020-09-05 NOTE — Progress Notes (Addendum)
Cardiology Office Note:    Date:  08/04/2021   ID:  Kenneth [name], DOB 05/17/1961, MRN [id number]  PCP:  [name], MD  Cardiologist:  Buford Dresser, MD PhD  Referring MD: [name],*   CC: follow up  History of Present Illness:    Kenneth [name] is a 59 y.o. male with a hx of hypertension, diabetes, tobacco use, non-ischemic cardiomyopathy with EF 30-35%, COPD who is seen for follow up today. I initially saw him 10/21/18 as a new patient to me at the request of [name],* for the evaluation and management of paroxysmal atrial fibrillation.  Cardiac history: he followed with a cardiologist in [place], then in [place], but he recently moved back to the area. Per care everywhere notes: history of angioedema on ACEi, was on losartan and tolerating in 2016; however in [place] he was not on ARB but hydralazine instead. Cath/echo in 2011 showed normal coronaries, reduced LV function with EF 40%. He was admitted for COPD and HF exacerbation in 11/2017 in [facility], and at that time he was on pradaxa for anticoagulation. He was seen by Roderic Palau in the afib clinic in 07/2018, started on apixaban and carvedilol uptitrated. Echo repeated 08/2018, below. Admitted for COPD exacerbation with heart failure exacerbation, discharged 06/22/20.  Today: Continues to have long term issues with his breathing. Hasn't required hospitalization since our last visit. Did have recent COPD exacerbation, took antibiotics and steroids and felt improved.   We reviewed his medications. No issues.   Denies PND, orthopnea, LE edema or unexpected weight gain. No syncope or palpitations.  Past Medical History:  Diagnosis Date   Anxiety    Arthritis    Atrial fibrillation (HCC)    Chest tightness 06/12/2019   CHF (congestive heart failure) (H[facility])    COPD (chronic obstructive pulmonary disease) (H[facility])    emphysema   Depression    Diabetes mellitus without complication (H[facility])     Heart murmur    Hyperlipidemia    Hypertension    Sleep apnea with use of continuous positive airway pressure (CPAP)     Past Surgical History:  Procedure Laterality Date   NO PAST SURGERIES      Current Medications: Current Outpatient Medications on File Prior to Visit  Medication Sig   acetaminophen (TYLENOL) 325 MG tablet Take 650 mg by mouth every 6 (six) hours as needed for mild pain or headache.   Blood Glucose Monitoring Suppl (ONE TOUCH ULTRA 2) w/Device KIT Use to test blood sugars 1-2 times daily.   busPIRone (BUSPAR) 15 MG tablet Take 1 tablet (15 mg total) by mouth 2 (two) times daily.   furosemide (LASIX) 40 MG tablet Take 1 tablet (40 mg total) by mouth 2 (two) times daily.   isosorbide mononitrate (IMDUR) 30 MG 24 hr tablet Take 30 mg by mouth daily.   Lancets (ONETOUCH ULTRASOFT) lancets USE TO TEST BLOOD SUGAR 1-2 TIMES DAILY   nitroGLYCERIN (NITROSTAT) 0.4 MG SL tablet DISSOLVE 1 TABLET UNDER THE TONGUE EVERY 5 MINUTES AS&nbsp;&nbsp;NEEDED FOR CHEST PAIN. MAX&nbsp;&nbsp;OF 3 TABLETS IN 15 MINUTES. CALL 911 IF PAIN PERSISTS. (Patient taking differently: Place 0.4 mg under the tongue every 5 (five) minutes as needed for chest pain.)   No current facility-administered medications on file prior to visit.     Allergies:   Lisinopril, Other, and Tomato   Social History   Tobacco Use   Smoking status: Some Days    Packs/day: 2.50    Years: 30.00  Pack years: 75.00    Types: Cigarettes    Last attempt to quit: 06/2020    Years since quitting: 1.1   Smokeless tobacco: Never  Vaping Use   Vaping Use: Never used  Substance Use Topics   Alcohol use: Yes    Comment: pt stated that it was 6 months ago he drunk bourbon   Drug use: Yes    Frequency: 1.0 times per week    Types: Marijuana    Comment: occ marijuana    Family History: The patient's family history includes Coronary artery disease in his brother; Diabetes in his father, mother, and sister; Heart  disease in his father, mother, and sister; Kidney disease in his sister; Liver disease in his maternal uncle. Father also had unknown heart disease. Mother had cancer.   ROS:   Please see the history of present illness.  Additional pertinent ROS otherwise unremarkable.  EKGs/Labs/Other Studies Reviewed:    The following studies were reviewed today: Echo 04/07/20  1. LVEF is mildly depressed with inferior hypokinesis. Compared to  previous echo, LVEF is improved. . Left ventricular ejection fraction, by  estimation, is 45 to 50%. The left ventricle has mildly decreased  function. The left ventricle demonstrates  regional wall motion abnormalities (see scoring diagram/findings for  description). The left ventricular internal cavity size was mildly  dilated. Indeterminate diastolic filling due to E-A fusion.   2. Right ventricular systolic function is normal. The right ventricular  size is normal. There is normal pulmonary artery systolic pressure.   3. The mitral valve is abnormal. Trivial mitral valve regurgitation.   4. The aortic valve is abnormal. Aortic valve regurgitation is mild. Mild  to moderate aortic valve sclerosis/calcification is present, without any  evidence of aortic stenosis.   5. The inferior vena cava is normal in size with greater than 50%  respiratory variability, suggesting right atrial pressure of 3 mmHg.   Echo 06/13/19 1. Left ventricular ejection fraction, by visual estimation, is 30 to  35%. The left ventricle has normal function. Normal left ventricular size.  There is moderately increased left ventricular hypertrophy.   2. Basal and mid inferolateral wall is abnormal.   3. Left ventricular diastolic Doppler parameters are indeterminate  pattern of LV diastolic filling.   4. Global right ventricle has normal systolic function.The right  ventricular size is normal. No increase in right ventricular wall  thickness.   5. Left atrial size was normal.   6.  Right atrial size was normal.   7. The mitral valve is normal in structure. Trace mitral valve  regurgitation. No evidence of mitral stenosis.   8. The tricuspid valve is normal in structure. Tricuspid valve  regurgitation is trivial.   9. The aortic valve is normal in structure. Aortic valve regurgitation is  mild to moderate by color flow Doppler. Mild aortic valve sclerosis  without stenosis.  10. The pulmonic valve was normal in structure. Pulmonic valve  regurgitation is not visualized by color flow Doppler.  11. The inferior vena cava is normal in size with greater than 50%  respiratory variability, suggesting right atrial pressure of 3 mmHg.   Echo 09/21/18 Left ventricle: The cavity size was normal. Wall thickness was   normal. Systolic function was moderately to severely reduced. The   estimated ejection fraction was in the range of 30% to 35%.   Diffuse hypokinesis. There is hypokinesis of the inferior and   inferoseptal myocardium. Features are consistent with a   pseudonormal  left ventricular filling pattern, with concomitant   abnormal relaxation and increased filling pressure (grade 2   diastolic dysfunction). - Aortic valve: Trileaflet; moderately thickened, moderately   calcified leaflets. There was mild regurgitation. - Left atrium: The atrium was moderately dilated. - Tricuspid valve: There was trivial regurgitation. - Pulmonary arteries: Systolic pressure was mildly increased. PA   peak pressure: 32 mm Hg (S).  EKG:  EKG is personally reviewed.  The ekg ordered 06/28/20 demonstrates sinus bradycardia, LVH with repol changes, similar to prior to admission ECG  Recent Labs: 04/09/2021: Magnesium 2.1 05/03/2021: ALT 32; B Natriuretic Peptide 205.2; BUN 30; Creatinine, Ser 1.98; Hemoglobin 9.9; Platelets 274; Potassium 3.9; Sodium 131  Recent Lipid Panel    Component Value Date/Time   CHOL 192 04/15/2021 1154   TRIG 181.0 (H) 04/15/2021 1154   HDL 102.90 04/15/2021  1154   CHOLHDL 2 04/15/2021 1154   VLDL 36.2 04/15/2021 1154   LDLCALC 53 04/15/2021 1154    Physical Exam:    VS:  BP 116/80   Pulse 66   Ht 6' (1.829 m)   Wt 203 lb 3.2 oz (92.2 kg)   BMI 27.56 kg/m     Wt Readings from Last 3 Encounters:  05/03/21 198 lb (89.8 kg)  04/15/21 197 lb 3.2 oz (89.4 kg)  04/09/21 197 lb 1.5 oz (89.4 kg)    GEN: Well nourished, well developed in no acute distress HEENT: Normal, moist mucous membranes NECK: No JVD CARDIAC: regular rhythm, normal S1 and S2, no rubs or gallops. No murmur. VASCULAR: Radial and DP pulses 2+ bilaterally. No carotid bruits RESPIRATORY:  Distant breath sounds with prolonged expiratory phase  ABDOMEN: Soft, non-tender, non-distended MUSCULOSKELETAL:  Ambulates independently SKIN: Warm and dry, no edema NEUROLOGIC:  Alert and oriented x 3. No focal neuro deficits noted. PSYCHIATRIC:  Normal affect   ASSESSMENT:    1. Chronic obstructive pulmonary disease, unspecified COPD type ([place])   2. Chronic combined systolic and diastolic congestive heart failure ([place])   3. PAF (paroxysmal atrial fibrillation) ([place])   4. Essential hypertension     PLAN:    chronic systolic and diastolic heart failure:  -limited more by COPD than heart failure he feels -EF improved on most recent echo to 45-50% -tolerating carvedilol 12.5 mg BID, watch for bradycardia -had angioedema on ACEi before. Tolerating losartan 50 mg daily, will continue. Have discussed entresto, but given increased risk of angioedema on this, will continue ARB. -continue daily weight, salt avoidance, fluid restriction -counseled on red flag warning signs, including chest pain/pressure, that need immediate medical attention.   Paroxysmal atrial fibrillation -CHA2DS2/VAS Stroke Risk Points=3 -continue apixaban, denies bleeding issues  Hypertension: -control improving, at goal today and home numbers improving. -continue imdur 60 mg daily, lasix TBD as  above -continue carvedilol, losartan, amlodipine  Sleep apnea: -has CPAP  Severe COPD: -followed by Dr. Lamonte Sakai, multiple recent admissions for COPD exacerbation -continue tobacco abstinence  Type II diabetes, with chronic systolic and diastolic dysfunction: -on metformin -have discussed SGLT2i, but cost is a concern for him. -we have previously discussed recommendation for statin given his risk and diabetes -no longer on aspirin as he is on apixaban  Prevention and risk counseling -recommend heart healthy/Mediterranean diet, with whole grains, fruits, vegetable, fish, lean meats, nuts, and olive oil. Limit salt. -recommend moderate walking, 3-5 times/week for 30-50 minutes each session. Aim for at least 150 minutes.week. Goal should be pace of 3 miles/hours, or walking 1.5 miles in 30 minutes -  recommend avoidance of tobacco products. Avoid excess alcohol. -ASCVD risk score: no known ASCVD  Plan for follow up: 3 mos or sooner as needed  Buford Dresser, MD, PhD [facility]   Medication Adjustments/Labs and Tests Ordered: Current medicines are reviewed at length with the patient today.  Concerns regarding medicines are outlined above.  No orders of the defined types were placed in this encounter.  No orders of the defined types were placed in this encounter.   Patient Instructions  Medication Instructions:  Your Physician recommend you continue on your current medication as directed.    *If you need a refill on your cardiac medications before your next appointment, please call your pharmacy*   Lab Work: None   Testing/Procedures: None   Follow-Up: At [facility], you and your health needs are our priority.  As part of our continuing mission to provide you with exceptional heart care, we have created designated Provider Care Teams.  These Care Teams include your primary Cardiologist (physician) and Advanced Practice Providers (APPs -  Physician  Assistants and Nurse Practitioners) who all work together to provide you with the care you need, when you need it.  We recommend signing up for the patient portal called "MyChart".  Sign up information is provided on this After Visit Summary.  MyChart is used to connect with patients for Virtual Visits (Telemedicine).  Patients are able to view lab/test results, encounter notes, upcoming appointments, etc.  Non-urgent messages can be sent to your provider as well.   To learn more about what you can do with MyChart, go to [email or web address].    Your next appointment:   3 month(s)  The format for your next appointment:   In Person  Provider:   Buford Dresser, MD     Signed, Buford Dresser, MD PhD [place]

## 2020-09-05 NOTE — Patient Instructions (Signed)
Continue on TRELEGY 1 puff daily  Continue on Oxygen 2l/m  Continue on CPAP At bedtime  .  Order for POC .  Follow up with Dr. Delton Coombes or Wafa Martes NP in 2-3 monts and As needed  Please contact office for sooner follow up if symptoms do not improve or worsen or seek emergency care

## 2020-09-05 NOTE — Patient Instructions (Signed)

## 2020-09-10 NOTE — Assessment & Plan Note (Signed)
Continue on oxygen 2 L.  Order for portable oxygen concentrator.

## 2020-09-10 NOTE — Assessment & Plan Note (Signed)
Recent exacerbation now improved  Plan  Patient Instructions  Continue on TRELEGY 1 puff daily  Continue on Oxygen 2l/m  Continue on CPAP At bedtime  .  Order for POC .  Follow up with Dr. Delton Coombes or Antwion Carpenter NP in 2-3 monts and As needed  Please contact office for sooner follow up if symptoms do not improve or worsen or seek emergency care    '

## 2020-09-10 NOTE — Assessment & Plan Note (Signed)
Continue on CPAP at bedtime 

## 2020-09-12 ENCOUNTER — Other Ambulatory Visit: Payer: Self-pay

## 2020-09-12 ENCOUNTER — Telehealth: Payer: Self-pay | Admitting: Adult Health

## 2020-09-12 ENCOUNTER — Emergency Department (HOSPITAL_COMMUNITY): Payer: Medicare Other

## 2020-09-12 ENCOUNTER — Inpatient Hospital Stay (HOSPITAL_COMMUNITY)
Admission: EM | Admit: 2020-09-12 | Discharge: 2020-09-16 | DRG: 208 | Disposition: A | Discharge status: Home / Self Care | Payer: Medicare Other | Attending: Internal Medicine | Admitting: Internal Medicine

## 2020-09-12 DIAGNOSIS — Z4682 Encounter for fitting and adjustment of non-vascular catheter: Secondary | ICD-10-CM | POA: Diagnosis not present

## 2020-09-12 DIAGNOSIS — R4020 Unspecified coma: Secondary | ICD-10-CM | POA: Diagnosis not present

## 2020-09-12 DIAGNOSIS — I480 Paroxysmal atrial fibrillation: Secondary | ICD-10-CM | POA: Diagnosis not present

## 2020-09-12 DIAGNOSIS — Z888 Allergy status to other drugs, medicaments and biological substances status: Secondary | ICD-10-CM

## 2020-09-12 DIAGNOSIS — E785 Hyperlipidemia, unspecified: Secondary | ICD-10-CM | POA: Diagnosis not present

## 2020-09-12 DIAGNOSIS — I351 Nonrheumatic aortic (valve) insufficiency: Secondary | ICD-10-CM | POA: Diagnosis present

## 2020-09-12 DIAGNOSIS — J441 Chronic obstructive pulmonary disease with (acute) exacerbation: Secondary | ICD-10-CM

## 2020-09-12 DIAGNOSIS — Z833 Family history of diabetes mellitus: Secondary | ICD-10-CM

## 2020-09-12 DIAGNOSIS — Z452 Encounter for adjustment and management of vascular access device: Secondary | ICD-10-CM | POA: Diagnosis not present

## 2020-09-12 DIAGNOSIS — F419 Anxiety disorder, unspecified: Secondary | ICD-10-CM | POA: Diagnosis present

## 2020-09-12 DIAGNOSIS — J9600 Acute respiratory failure, unspecified whether with hypoxia or hypercapnia: Secondary | ICD-10-CM | POA: Diagnosis not present

## 2020-09-12 DIAGNOSIS — J449 Chronic obstructive pulmonary disease, unspecified: Secondary | ICD-10-CM

## 2020-09-12 DIAGNOSIS — R0602 Shortness of breath: Secondary | ICD-10-CM | POA: Diagnosis not present

## 2020-09-12 DIAGNOSIS — Z8249 Family history of ischemic heart disease and other diseases of the circulatory system: Secondary | ICD-10-CM | POA: Diagnosis not present

## 2020-09-12 DIAGNOSIS — F32A Depression, unspecified: Secondary | ICD-10-CM | POA: Diagnosis not present

## 2020-09-12 DIAGNOSIS — Z79899 Other long term (current) drug therapy: Secondary | ICD-10-CM | POA: Diagnosis not present

## 2020-09-12 DIAGNOSIS — E1140 Type 2 diabetes mellitus with diabetic neuropathy, unspecified: Secondary | ICD-10-CM

## 2020-09-12 DIAGNOSIS — I5043 Acute on chronic combined systolic (congestive) and diastolic (congestive) heart failure: Secondary | ICD-10-CM | POA: Diagnosis not present

## 2020-09-12 DIAGNOSIS — J9602 Acute respiratory failure with hypercapnia: Secondary | ICD-10-CM | POA: Diagnosis present

## 2020-09-12 DIAGNOSIS — J9601 Acute respiratory failure with hypoxia: Secondary | ICD-10-CM | POA: Diagnosis not present

## 2020-09-12 DIAGNOSIS — R918 Other nonspecific abnormal finding of lung field: Secondary | ICD-10-CM | POA: Diagnosis not present

## 2020-09-12 DIAGNOSIS — Z91018 Allergy to other foods: Secondary | ICD-10-CM | POA: Diagnosis not present

## 2020-09-12 DIAGNOSIS — R9431 Abnormal electrocardiogram [ECG] [EKG]: Secondary | ICD-10-CM | POA: Diagnosis not present

## 2020-09-12 DIAGNOSIS — Z7984 Long term (current) use of oral hypoglycemic drugs: Secondary | ICD-10-CM | POA: Diagnosis not present

## 2020-09-12 DIAGNOSIS — I110 Hypertensive heart disease with heart failure: Secondary | ICD-10-CM | POA: Diagnosis not present

## 2020-09-12 DIAGNOSIS — J439 Emphysema, unspecified: Secondary | ICD-10-CM | POA: Diagnosis present

## 2020-09-12 DIAGNOSIS — Z87891 Personal history of nicotine dependence: Secondary | ICD-10-CM

## 2020-09-12 DIAGNOSIS — G9341 Metabolic encephalopathy: Secondary | ICD-10-CM | POA: Diagnosis present

## 2020-09-12 DIAGNOSIS — E119 Type 2 diabetes mellitus without complications: Secondary | ICD-10-CM | POA: Diagnosis not present

## 2020-09-12 DIAGNOSIS — R011 Cardiac murmur, unspecified: Secondary | ICD-10-CM | POA: Diagnosis present

## 2020-09-12 DIAGNOSIS — N179 Acute kidney failure, unspecified: Secondary | ICD-10-CM | POA: Diagnosis not present

## 2020-09-12 DIAGNOSIS — M1990 Unspecified osteoarthritis, unspecified site: Secondary | ICD-10-CM | POA: Diagnosis present

## 2020-09-12 DIAGNOSIS — I5032 Chronic diastolic (congestive) heart failure: Secondary | ICD-10-CM | POA: Diagnosis not present

## 2020-09-12 DIAGNOSIS — J80 Acute respiratory distress syndrome: Secondary | ICD-10-CM | POA: Diagnosis not present

## 2020-09-12 DIAGNOSIS — J8489 Other specified interstitial pulmonary diseases: Secondary | ICD-10-CM | POA: Diagnosis not present

## 2020-09-12 DIAGNOSIS — I350 Nonrheumatic aortic (valve) stenosis: Secondary | ICD-10-CM | POA: Diagnosis not present

## 2020-09-12 DIAGNOSIS — N1831 Chronic kidney disease, stage 3a: Secondary | ICD-10-CM | POA: Diagnosis not present

## 2020-09-12 DIAGNOSIS — R404 Transient alteration of awareness: Secondary | ICD-10-CM | POA: Diagnosis not present

## 2020-09-12 DIAGNOSIS — Z20822 Contact with and (suspected) exposure to covid-19: Secondary | ICD-10-CM | POA: Diagnosis present

## 2020-09-12 DIAGNOSIS — G4733 Obstructive sleep apnea (adult) (pediatric): Secondary | ICD-10-CM | POA: Diagnosis present

## 2020-09-12 DIAGNOSIS — Z7901 Long term (current) use of anticoagulants: Secondary | ICD-10-CM | POA: Diagnosis not present

## 2020-09-12 DIAGNOSIS — J984 Other disorders of lung: Secondary | ICD-10-CM | POA: Diagnosis not present

## 2020-09-12 LAB — COMPREHENSIVE METABOLIC PANEL
ALT: 18 U/L (ref 0–44)
AST: 26 U/L (ref 15–41)
Albumin: 3.6 g/dL (ref 3.5–5.0)
Alkaline Phosphatase: 60 U/L (ref 38–126)
Anion gap: 11 (ref 5–15)
BUN: 7 mg/dL (ref 6–20)
CO2: 27 mmol/L (ref 22–32)
Calcium: 8.5 mg/dL — ABNORMAL LOW (ref 8.9–10.3)
Chloride: 99 mmol/L (ref 98–111)
Creatinine, Ser: 1.58 mg/dL — ABNORMAL HIGH (ref 0.61–1.24)
GFR, Estimated: 50 mL/min — ABNORMAL LOW (ref >60)
Glucose, Bld: 189 mg/dL — ABNORMAL HIGH (ref 70–99)
Potassium: 3.9 mmol/L (ref 3.5–5.1)
Sodium: 137 mmol/L (ref 135–145)
Total Bilirubin: 0.7 mg/dL (ref 0.3–1.2)
Total Protein: 6.3 g/dL — ABNORMAL LOW (ref 6.5–8.1)

## 2020-09-12 LAB — CBC WITH DIFFERENTIAL/PLATELET
Abs Immature Granulocytes: 0.05 10*3/uL (ref 0.00–0.07)
Basophils Absolute: 0.1 10*3/uL (ref 0.0–0.1)
Basophils Relative: 1 %
Eosinophils Absolute: 0.2 10*3/uL (ref 0.0–0.5)
Eosinophils Relative: 4 %
HCT: 40.3 % (ref 39.0–52.0)
Hemoglobin: 12 g/dL — ABNORMAL LOW (ref 13.0–17.0)
Immature Granulocytes: 1 %
Lymphocytes Relative: 28 %
Lymphs Abs: 1.8 10*3/uL (ref 0.7–4.0)
MCH: 28 pg (ref 26.0–34.0)
MCHC: 29.8 g/dL — ABNORMAL LOW (ref 30.0–36.0)
MCV: 93.9 fL (ref 80.0–100.0)
Monocytes Absolute: 1.1 10*3/uL — ABNORMAL HIGH (ref 0.1–1.0)
Monocytes Relative: 17 %
Neutro Abs: 3 10*3/uL (ref 1.7–7.7)
Neutrophils Relative %: 49 %
Platelets: 271 10*3/uL (ref 150–400)
RBC: 4.29 MIL/uL (ref 4.22–5.81)
RDW: 13 % (ref 11.5–15.5)
WBC: 6.2 10*3/uL (ref 4.0–10.5)
nRBC: 0 % (ref 0.0–0.2)

## 2020-09-12 LAB — I-STAT ARTERIAL BLOOD GAS, ED
Acid-Base Excess: 6 mmol/L — ABNORMAL HIGH (ref 0.0–2.0)
Bicarbonate: 32.9 mmol/L — ABNORMAL HIGH (ref 20.0–28.0)
Calcium, Ion: 1.15 mmol/L (ref 1.15–1.40)
HCT: 35 % — ABNORMAL LOW (ref 39.0–52.0)
Hemoglobin: 11.9 g/dL — ABNORMAL LOW (ref 13.0–17.0)
O2 Saturation: 100 %
Patient temperature: 97.3
Potassium: 3.8 mmol/L (ref 3.5–5.1)
Sodium: 137 mmol/L (ref 135–145)
TCO2: 35 mmol/L — ABNORMAL HIGH (ref 22–32)
pCO2 arterial: 56.2 mmHg — ABNORMAL HIGH (ref 32.0–48.0)
pH, Arterial: 7.372 (ref 7.350–7.450)
pO2, Arterial: 200 mmHg — ABNORMAL HIGH (ref 83.0–108.0)

## 2020-09-12 LAB — I-STAT CHEM 8, ED
BUN: 8 mg/dL (ref 6–20)
Calcium, Ion: 1.12 mmol/L — ABNORMAL LOW (ref 1.15–1.40)
Chloride: 97 mmol/L — ABNORMAL LOW (ref 98–111)
Creatinine, Ser: 1.6 mg/dL — ABNORMAL HIGH (ref 0.61–1.24)
Glucose, Bld: 184 mg/dL — ABNORMAL HIGH (ref 70–99)
HCT: 39 % (ref 39.0–52.0)
Hemoglobin: 13.3 g/dL (ref 13.0–17.0)
Potassium: 3.8 mmol/L (ref 3.5–5.1)
Sodium: 139 mmol/L (ref 135–145)
TCO2: 31 mmol/L (ref 22–32)

## 2020-09-12 MED ORDER — ETOMIDATE 2 MG/ML IV SOLN
INTRAVENOUS | Status: DC | PRN
  Administered 2020-09-12: 20 mg via INTRAVENOUS

## 2020-09-12 MED ORDER — PROPOFOL 1000 MG/100ML IV EMUL
5.0000 ug/kg/min | INTRAVENOUS | Status: DC
  Administered 2020-09-12: 10 ug/kg/min via INTRAVENOUS
  Filled 2020-09-12: qty 100

## 2020-09-12 MED ORDER — ROCURONIUM BROMIDE 50 MG/5ML IV SOLN
INTRAVENOUS | Status: DC | PRN
  Administered 2020-09-12: 100 mg via INTRAVENOUS

## 2020-09-12 MED ORDER — NOREPINEPHRINE 4 MG/250ML-% IV SOLN
0.0000 ug/min | INTRAVENOUS | Status: DC
  Administered 2020-09-12: 2 ug/min via INTRAVENOUS
  Administered 2020-09-13: 1 ug/min via INTRAVENOUS
  Filled 2020-09-12: qty 250

## 2020-09-12 MED ORDER — IPRATROPIUM BROMIDE 0.02 % IN SOLN
1.0000 mg | Freq: Once | RESPIRATORY_TRACT | Status: AC
  Administered 2020-09-12: 1 mg via RESPIRATORY_TRACT
  Filled 2020-09-12: qty 5

## 2020-09-12 MED ORDER — ALBUTEROL (5 MG/ML) CONTINUOUS INHALATION SOLN
20.0000 mg/h | INHALATION_SOLUTION | Freq: Once | RESPIRATORY_TRACT | Status: AC
  Administered 2020-09-12: 20 mg/h via RESPIRATORY_TRACT
  Filled 2020-09-12: qty 20

## 2020-09-12 MED ORDER — MAGNESIUM SULFATE 2 GM/50ML IV SOLN
2.0000 g | Freq: Once | INTRAVENOUS | Status: AC
  Administered 2020-09-12: 2 g via INTRAVENOUS
  Filled 2020-09-12: qty 50

## 2020-09-12 MED ORDER — METHYLPREDNISOLONE SODIUM SUCC 125 MG IJ SOLR
125.0000 mg | Freq: Once | INTRAMUSCULAR | Status: AC
  Administered 2020-09-12: 125 mg via INTRAVENOUS
  Filled 2020-09-12: qty 2

## 2020-09-12 NOTE — ED Triage Notes (Signed)
Pt from home called out for sob throughout the day unrelieved with home duoneb treatments. Pt was able to take a few steps to ambulance, became more labored in breathing. EMS placed on cpap, became unresponsive then bagged pt. On arrival pt belly breathing, unresponsive to pain. Wheezing noted throughout

## 2020-09-12 NOTE — H&P (Signed)
NAME:  Kenneth [name], MRN:  [id number], DOB:  01-29-1961, LOS: 0 ADMISSION DATE:  09/12/2020,   CHIEF COMPLAINT: Respiratory distress with altered level of consciousness  Brief History:  59 year old male that presented with altered level of consciousness and respiratory distress.  History of Present Illness:  This is a 59 year old black male that presented from home to the emergency room.  The patient had been found less responsive and was having difficulty breathing therefore EMS was called by family.  Patient was able to ambulate a couple steps to the stretcher.  By the time patient arrived to the emergency room he was unconscious unresponsive and required emergent intubation.  But reviewing the medical records it appears that the patient has had 2 months of difficulty with his COPD symptoms.  Most recently on 09/05/2020 patient was placed on prednisone for COPD exacerbation.  Patient was vaccinated for Covid in April of this year.  Baseline the patient uses 2 L nasal cannula during the day and CPAP at night.  Past Medical History:  COPD Systolic heart failure Paroxysmal atrial fibrillation Anxiety Depression Diabetes mellitus type 2 Hypertension Hyperlipidemia OSA   Significant Diagnostic Tests:  CT head pending  Micro Data:  Covid negative Influenza negative  Antimicrobials:  Rocephin and azithromycin  Objective   Blood pressure (!) 167/88, pulse 97, temperature (!) 97.3 F (36.3 C), temperature source Temporal, resp. rate 15, height 6' (1.829 m), weight 91 kg, SpO2 100 %.    Vent Mode: PRVC FiO2 (%):  [80 %] 80 % Set Rate:  [18 bmp] 18 bmp Vt Set:  [620 mL] 620 mL PEEP:  [5 cmH20] 5 cmH20 Plateau Pressure:  [27 cmH20] 27 cmH20  No intake or output data in the 24 hours ending 09/12/20 2303 Filed Weights   09/12/20 2250  Weight: 91 kg    Examination: General: Unconscious/unresponsive but no acute distress HENT: Atraumatic/normocephalic.  Orally intubated with  endotracheal tube.  Mucous membranes are moist. Lungs: Bilateral lungs are very tight with wheezing.  No significant rales or rhonchi noted. Cardiovascular: Regular rate no murmur rub or gallop appreciated Abdomen: Soft, nondistended, positive bowel sounds, no rebound/rigidity/guarding the limited by neurologic conditions. Extremities: Distal pulse intact x4.  No significant edema of the lower extremities.  No cyanosis. Neuro: Unconscious/unresponsive, not following commands.  Pupils are equal at 4 mm.    Assessment & Plan:  Acute respiratory failure Severe COPD exacerbation History of congestive heart failure Altered level of consciousness   Plan: Admit to the intensive care unit for further work-up. Standard ventilator support per protocol. Patient was given albuterol 20 mg nebulized in ER. Solu-Medrol 60 mg IV every 6 hours. Albuterol 2.5 mg nebulized every 4 hours and every 2 hours as needed We'll send troponins. 2D echocardiogram has been ordered. Patient required very low-dose Levophed to be started Propofol for sedation and fentanyl for pain control CT head for altered level of consciousness  Best practice (evaluated daily)  Diet: N.p.o. Pain/Anxiety/Delirium protocol (if indicated): Propofol/fentanyl VAP protocol (if indicated): Initiated DVT prophylaxis: Lovenox GI prophylaxis: Protonix Glucose control: Monitor blood glucose levels Mobility: Bedrest Disposition: Admit to intensive care unit  Goals of Care:  Code Status: Full code  Labs   CBC: Recent Labs  Lab 09/12/20 2230 09/12/20 2240 09/12/20 2256  WBC 6.2  --   --   NEUTROABS 3.0  --   --   HGB 12.0* 13.3 11.9*  HCT 40.3 39.0 35.0*  MCV 93.9  --   --  PLT 271  --   --     Basic Metabolic Panel: Recent Labs  Lab 09/12/20 2240 09/12/20 2256  NA 139 137  K 3.8 3.8  CL 97*  --   GLUCOSE 184*  --   BUN 8  --   CREATININE 1.60*  --    GFR: Estimated Creatinine Clearance: 54.6 mL/min (A) (by  C-G formula based on SCr of 1.6 mg/dL (H)). Recent Labs  Lab 09/12/20 2230  WBC 6.2    Liver Function Tests: No results for input(s): AST, ALT, ALKPHOS, BILITOT, PROT, ALBUMIN in the last 168 hours. No results for input(s): LIPASE, AMYLASE in the last 168 hours. No results for input(s): AMMONIA in the last 168 hours.  ABG    Component Value Date/Time   PHART 7.372 09/12/2020 2256   PCO2ART 56.2 (H) 09/12/2020 2256   PO2ART 200 (H) 09/12/2020 2256   HCO3 32.9 (H) 09/12/2020 2256   TCO2 35 (H) 09/12/2020 2256   ACIDBASEDEF 1.0 06/12/2019 1347   O2SAT 100.0 09/12/2020 2256     Coagulation Profile: No results for input(s): INR, PROTIME in the last 168 hours.  Cardiac Enzymes: No results for input(s): CKTOTAL, CKMB, CKMBINDEX, TROPONINI in the last 168 hours.  HbA1C: Hgb A1c MFr Bld  Date/Time Value Ref Range Status  06/21/2020 09:14 AM 6.8 (H) 4.8 - 5.6 % Final    Comment:    (NOTE) Pre diabetes:          5.7%-6.4%  Diabetes:              >6.4%  Glycemic control for   <7.0% adults with diabetes   04/13/2020 05:55 PM 6.4 (H) 4.8 - 5.6 % Final    Comment:    (NOTE) Pre diabetes:          5.7%-6.4%  Diabetes:              >6.4%  Glycemic control for   <7.0% adults with diabetes     CBG: No results for input(s): GLUCAP in the last 168 hours.  Review of Systems:   Unable to be completed at this time secondary to patient's neurologic condition.  Past Medical History:  He,  has a past medical history of Anxiety, Arthritis, Asthma, Atrial fibrillation ([place]), CHF (congestive heart failure) ([place]), COPD (chronic obstructive pulmonary disease) ([place]), Depression, Diabetes mellitus without complication ([place]), Emphysema of lung ([place]), Heart murmur, Hyperlipidemia, Hypertension, and Sleep apnea with use of continuous positive airway pressure (CPAP).   Surgical History:   Past Surgical History:  Procedure Laterality Date  . NO PAST SURGERIES       Social History:    reports that he quit smoking about 16 months ago. His smoking use included cigarettes. He has a 60.00 pack-year smoking history. He has never used smokeless tobacco. He reports current alcohol use. He reports current drug use. Drug: Marijuana.   Family History:  His family history includes Coronary artery disease in his brother; Diabetes in his father, mother, and sister; Heart disease in his father, mother, and sister; Kidney disease in his sister; Liver disease in his maternal uncle.   Allergies Allergies  Allergen Reactions  . Lisinopril Swelling  . Other Other (See Comments)    Lettuce : rash  . Tomato Rash     Home Medications  Prior to Admission medications   Medication Sig Start Date End Date Taking? Authorizing Provider  acetaminophen (TYLENOL) 325 MG tablet Take 650 mg by mouth every 6 (six) hours  as needed for mild pain or headache.    [provider]  albuterol (PROVENTIL HFA;VENTOLIN HFA) 108 (90 Base) MCG/ACT inhaler Inhale 1-2 puffs into the lungs every 4 (four) hours as needed for wheezing or shortness of breath.  06/19/16   [provider]  albuterol (PROVENTIL) (2.5 MG/3ML) 0.083% nebulizer solution INHALE 1 VIAL VIA NEBULIZER 5 TIMES DAILY AS NEEDED FOR WHEEZING OR FOR SHORTNESS OF BREATH 05/22/20   [name], MD  amLODipine (NORVASC) 5 MG tablet Take 1 tablet (5 mg total) by mouth daily. 04/23/20   Buford Dresser, MD  apixaban (ELIQUIS) 5 MG TABS tablet Take 1 tablet (5 mg total) by mouth 2 (two) times daily. 04/23/20   Buford Dresser, MD  ARIPiprazole (ABILIFY) 5 MG tablet Take 5 mg by mouth daily. 08/24/20   [provider]  Blood Glucose Monitoring Suppl (ONE TOUCH ULTRA 2) w/Device KIT Use to test blood sugars 1-2 times daily. 04/02/20   [name], MD  busPIRone (BUSPAR) 15 MG tablet Take 1 tablet (15 mg total) by mouth 2 (two) times daily. 09/06/19   [name], MD  carvedilol (COREG) 12.5 MG  tablet Take 1 tablet (12.5 mg total) by mouth 2 (two) times daily with a meal. 04/23/20   Buford Dresser, MD  famotidine (PEPCID) 20 MG tablet Take 20 mg by mouth daily.    [provider]  FLUoxetine (PROZAC) 20 MG capsule TAKE 3 CAPSULES BY MOUTH  DAILY 05/26/19   [name], MD  Fluticasone-Umeclidin-Vilant (TRELEGY ELLIPTA) 100-62.5-25 MCG/INH AEPB Inhale 1 puff into the lungs daily. 04/02/20   Collene Gobble, MD  furosemide (LASIX) 40 MG tablet Take 1 tablet (40 mg total) by mouth 2 (two) times daily. 08/02/20   Buford Dresser, MD  gabapentin (NEURONTIN) 300 MG capsule TAKE ONE CAPSULE BY MOUTH THREE TIMES DAILY 08/03/20   [name], MD  glucose blood [facility] ULTRA) test strip USE TO TEST BLOOD SUGAR 1-2 TIMES DAILY 05/22/20   [name], MD  ipratropium (ATROVENT) 0.02 % nebulizer solution Take 2.5 mLs (0.5 mg total) by nebulization 4 (four) times daily. 08/05/19   Law, Bea Graff, PA-C  ipratropium-albuterol (DUONEB) 0.5-2.5 (3) MG/3ML SOLN Take 3 mLs by nebulization every 6 (six) hours as needed. 08/24/20   Dutch Quint B, FNP  isosorbide mononitrate (IMDUR) 30 MG 24 hr tablet Take 30 mg by mouth daily. 07/04/20   [provider]  Lancets [facility] ULTRASOFT) lancets USE TO TEST BLOOD SUGAR 1-2 TIMES DAILY 05/22/20   [name], MD  losartan (COZAAR) 100 MG tablet Take 1 tablet (100 mg total) by mouth daily. 08/13/20 11/11/20  Buford Dresser, MD  lubiprostone ([name]) 8 MCG capsule TAKE 1 CAPSULE(8 MCG) BY MOUTH TWICE DAILY WITH A MEAL 04/30/20   Esterwood, Amy S, PA-C  metFORMIN (GLUCOPHAGE) 500 MG tablet Take 1 tablet (500 mg total) by mouth 2 (two) times daily with a meal. 07/25/20   [name], MD  Multiple Vitamin (MULTIVITAMIN WITH MINERALS) TABS tablet Take 1 tablet by mouth daily.    [provider]  nitroGLYCERIN (NITROSTAT) 0.4 MG SL tablet DISSOLVE 1 TABLET UNDER THE TONGUE  EVERY 5 MINUTES AS&nbsp;&nbsp;NEEDED FOR CHEST PAIN. MAX&nbsp;&nbsp;OF 3 TABLETS IN 15 MINUTES. CALL 911 IF PAIN PERSISTS. 07/25/20   [name], MD  pantoprazole (PROTONIX) 40 MG tablet TAKE ONE TABLET BY MOUTH EVERY MORNING 08/03/20   Esterwood, Amy S, PA-C  rosuvastatin (CRESTOR) 20 MG tablet Take  1 tablet (20 mg total) by mouth daily. 08/27/20   Dutch Quint B, FNP  traZODone (DESYREL) 50 MG tablet TAKE 1/2 TO 1 TABLET BY  MOUTH AT BEDTIME AS NEEDED  FOR SLEEP. 05/26/19   [name], MD  [facility] INHUB 250-50 MCG/DOSE AEPB Inhale 1 puff into the lungs 2 (two) times daily. 04/02/20   [provider]     Critical care time: 38mns

## 2020-09-12 NOTE — Telephone Encounter (Signed)
PLEASE PLACE AN ORDER FOR THIS Kenneth [name]

## 2020-09-12 NOTE — ED Provider Notes (Signed)
[facility] EMERGENCY DEPARTMENT Provider Note   CSN: [id number] Arrival date & time: 09/12/20  2150     History Chief Complaint  Patient presents with  . Respiratory Distress    Kenneth [name] is a 59 y.o. male hx of afib on Eliquis, CHF with EF of 25%, COPD here presenting with shortness of breath.  Patient has been having short of breath since this morning per EMS.  Patient apparently was wheezing throughout per EMS and was put on CPAP and became unresponsive.  I was called in the room because patient was unresponsive and respiratory was bagging the patient.  Unable to get any history from the patient.  The history is provided by the EMS personnel. The history is limited by the condition of the patient.  Level V caveat- unresponsive      Past Medical History:  Diagnosis Date  . Anxiety   . Arthritis   . Asthma   . Atrial fibrillation ([place])   . CHF (congestive heart failure) ([place])   . COPD (chronic obstructive pulmonary disease) ([place])   . Depression   . Diabetes mellitus without complication ([place])   . Emphysema of lung ([place])   . Heart murmur   . Hyperlipidemia   . Hypertension   . Sleep apnea with use of continuous positive airway pressure (CPAP)     Patient Active Problem List   Diagnosis Date Noted  . Thiamine deficiency 07/02/2020  . Acute respiratory distress 06/21/2020  . Chronic respiratory failure with hypoxia ([place]) 06/01/2020  . Obstructive sleep apnea 05/02/2020  . Type 2 diabetes mellitus with diabetic neuropathy, without long-term current use of insulin ([place]) 04/24/2020  . Hyperlipidemia   . Acute on chronic combined systolic and diastolic CHF (congestive heart failure) ([place]) 04/07/2020  . Chronic constipation 07/19/2019  . Hospital discharge follow-up 06/17/2019  . Shortness of breath 06/12/2019  . Chest tightness 06/12/2019  . Snoring 03/29/2019  . Insomnia due to other mental disorder 11/15/2018  . Anticoagulant long-term use  10/21/2018  . Gastroesophageal reflux disease without esophagitis 08/10/2018  . Tobacco abuse 08/10/2018  . Chest pain 07/27/2018  . PAF (paroxysmal atrial fibrillation) ([place]) 07/27/2018  . Mild renal insufficiency 07/27/2018  . Chronic combined systolic and diastolic heart failure ([place]) 07/27/2018  . Slow transit constipation 07/20/2018  . Essential hypertension 07/06/2018  . Chronic obstructive pulmonary disease ([place]) 07/06/2018  . Controlled type 2 diabetes mellitus without complication, without long-term current use of insulin ([place]) 07/06/2018  . Depression 07/06/2018  . Macrocytic anemia 07/06/2018  . Healthcare maintenance 07/06/2018  . Alcohol abuse 07/06/2018    Past Surgical History:  Procedure Laterality Date  . NO PAST SURGERIES         Family History  Problem Relation Age of Onset  . Diabetes Mother   . Heart disease Mother   . Diabetes Father   . Heart disease Father   . Diabetes Sister   . Heart disease Sister   . Kidney disease Sister   . Coronary artery disease Brother   . Liver disease Maternal Uncle     Social History   Tobacco Use  . Smoking status: Former Smoker    Packs/day: 2.00    Years: 30.00    Pack years: 60.00    Types: Cigarettes    Quit date: 05/01/2019    Years since quitting: 1.3  . Smokeless tobacco: Never Used  . Tobacco comment: smokes 2-3cigs per day  Vaping Use  . Vaping Use: Never  used  Substance Use Topics  . Alcohol use: Yes    Comment: Drinks up to six beers around a game  . Drug use: Yes    Types: Marijuana    Comment: occ marijuana    Home Medications Prior to Admission medications   Medication Sig Start Date End Date Taking? Authorizing Provider  acetaminophen (TYLENOL) 325 MG tablet Take 650 mg by mouth every 6 (six) hours as needed for mild pain or headache.    [provider]  albuterol (PROVENTIL HFA;VENTOLIN HFA) 108 (90 Base) MCG/ACT inhaler Inhale 1-2 puffs into the lungs every 4 (four) hours as  needed for wheezing or shortness of breath.  06/19/16   [provider]  albuterol (PROVENTIL) (2.5 MG/3ML) 0.083% nebulizer solution INHALE 1 VIAL VIA NEBULIZER 5 TIMES DAILY AS NEEDED FOR WHEEZING OR FOR SHORTNESS OF BREATH Patient taking differently: Take 2.5 mg by nebulization See admin instructions. 5 times daily prn wheezing or shortness of breath 05/22/20   [name], MD  amLODipine (NORVASC) 5 MG tablet Take 1 tablet (5 mg total) by mouth daily. 04/23/20   Buford Dresser, MD  apixaban (ELIQUIS) 5 MG TABS tablet Take 1 tablet (5 mg total) by mouth 2 (two) times daily. 04/23/20   Buford Dresser, MD  ARIPiprazole (ABILIFY) 5 MG tablet Take 5 mg by mouth daily. 08/24/20   [provider]  Blood Glucose Monitoring Suppl (ONE TOUCH ULTRA 2) w/Device KIT Use to test blood sugars 1-2 times daily. 04/02/20   [name], MD  busPIRone (BUSPAR) 15 MG tablet Take 1 tablet (15 mg total) by mouth 2 (two) times daily. 09/06/19   [name], MD  carvedilol (COREG) 12.5 MG tablet Take 1 tablet (12.5 mg total) by mouth 2 (two) times daily with a meal. 04/23/20   Buford Dresser, MD  famotidine (PEPCID) 20 MG tablet Take 20 mg by mouth daily.    [provider]  FLUoxetine (PROZAC) 20 MG capsule TAKE 3 CAPSULES BY MOUTH  DAILY Patient taking differently: Take 60 mg by mouth daily. 05/26/19   [name], MD  Fluticasone-Umeclidin-Vilant (TRELEGY ELLIPTA) 100-62.5-25 MCG/INH AEPB Inhale 1 puff into the lungs daily. 04/02/20   Collene Gobble, MD  furosemide (LASIX) 40 MG tablet Take 1 tablet (40 mg total) by mouth 2 (two) times daily. 08/02/20   Buford Dresser, MD  gabapentin (NEURONTIN) 300 MG capsule TAKE ONE CAPSULE BY MOUTH THREE TIMES DAILY Patient taking differently: Take 300 mg by mouth 3 (three) times daily. 08/03/20   [name], MD  glucose blood [facility] ULTRA) test strip USE TO TEST BLOOD SUGAR  1-2 TIMES DAILY 05/22/20   [name], MD  ipratropium (ATROVENT) 0.02 % nebulizer solution Take 2.5 mLs (0.5 mg total) by nebulization 4 (four) times daily. 08/05/19   Law, Bea Graff, PA-C  ipratropium-albuterol (DUONEB) 0.5-2.5 (3) MG/3ML SOLN Take 3 mLs by nebulization every 6 (six) hours as needed. 08/24/20   Dutch Quint B, FNP  isosorbide mononitrate (IMDUR) 30 MG 24 hr tablet Take 30 mg by mouth daily. 07/04/20   [provider]  Lancets [facility] ULTRASOFT) lancets USE TO TEST BLOOD SUGAR 1-2 TIMES DAILY 05/22/20   [name], MD  losartan (COZAAR) 100 MG tablet Take 1 tablet (100 mg total) by mouth daily. 08/13/20 11/11/20  Buford Dresser, MD  lubiprostone ([name]) 8 MCG capsule TAKE 1 CAPSULE(8 MCG) BY MOUTH TWICE DAILY WITH A MEAL Patient taking differently: Take 8 mcg  by mouth 2 (two) times daily with a meal. 04/30/20   Esterwood, Amy S, PA-C  metFORMIN (GLUCOPHAGE) 500 MG tablet Take 1 tablet (500 mg total) by mouth 2 (two) times daily with a meal. 07/25/20   [name], MD  Multiple Vitamin (MULTIVITAMIN WITH MINERALS) TABS tablet Take 1 tablet by mouth daily.    [provider]  nitroGLYCERIN (NITROSTAT) 0.4 MG SL tablet DISSOLVE 1 TABLET UNDER THE TONGUE EVERY 5 MINUTES AS&nbsp;&nbsp;NEEDED FOR CHEST PAIN. MAX&nbsp;&nbsp;OF 3 TABLETS IN 15 MINUTES. CALL 911 IF PAIN PERSISTS. Patient taking differently: Place 0.4 mg under the tongue every 5 (five) minutes as needed for chest pain. 07/25/20   [name], MD  pantoprazole (PROTONIX) 40 MG tablet TAKE ONE TABLET BY MOUTH EVERY MORNING Patient taking differently: Take 40 mg by mouth daily. 08/03/20   Esterwood, Amy S, PA-C  rosuvastatin (CRESTOR) 20 MG tablet Take 1 tablet (20 mg total) by mouth daily. 08/27/20   Dutch Quint B, FNP  traZODone (DESYREL) 50 MG tablet TAKE 1/2 TO 1 TABLET BY  MOUTH AT BEDTIME AS NEEDED  FOR SLEEP. Patient taking differently: Take 25-50 mg  by mouth at bedtime as needed for sleep. 05/26/19   [name], MD  WIXELA INHUB 250-50 MCG/DOSE AEPB Inhale 1 puff into the lungs 2 (two) times daily. 04/02/20   [provider]    Allergies    Lisinopril, Other, and Tomato  Review of Systems   Review of Systems  Unable to perform ROS: Acuity of condition  Respiratory: Positive for shortness of breath.   All other systems reviewed and are negative.   Physical Exam Updated Vital Signs BP (!) 167/88   Pulse 97   Temp (!) 97.3 F (36.3 C) (Temporal)   Resp 15   Ht 6' (1.829 m)   Wt 91 kg   SpO2 100%   BMI 27.21 kg/m   Physical Exam Vitals and nursing note reviewed.  Constitutional:      Comments: Unresponsive, tachypneic   HENT:     Mouth/Throat:     Mouth: Mucous membranes are moist.  Eyes:     Extraocular Movements: Extraocular movements intact.     Pupils: Pupils are equal, round, and reactive to light.  Cardiovascular:     Rate and Rhythm: Normal rate and regular rhythm.  Pulmonary:     Comments: Wheezing throughout, + retractions  Abdominal:     General: Abdomen is flat.     Palpations: Abdomen is soft.  Musculoskeletal:        General: Normal range of motion.     Cervical back: Normal range of motion and neck supple.  Skin:    General: Skin is warm.     Capillary Refill: Capillary refill takes less than 2 seconds.  Neurological:     Comments: Moving all extremities and no obvious focal deficits.  Not following commands  Psychiatric:     Comments: Unable      ED Results / Procedures / Treatments   Labs (all labs ordered are listed, but only abnormal results are displayed) Labs Reviewed  CBC WITH DIFFERENTIAL/PLATELET - Abnormal; Notable for the following components:      Result Value   Hemoglobin 12.0 (*)    MCHC 29.8 (*)    Monocytes Absolute 1.1 (*)    All other components within normal limits  I-STAT CHEM 8, ED - Abnormal; Notable for the following components:   Chloride 97  (*)    Creatinine,  Ser 1.60 (*)    Glucose, Bld 184 (*)    Calcium, Ion 1.12 (*)    All other components within normal limits  I-STAT ARTERIAL BLOOD GAS, ED - Abnormal; Notable for the following components:   pCO2 arterial 56.2 (*)    pO2, Arterial 200 (*)    Bicarbonate 32.9 (*)    TCO2 35 (*)    Acid-Base Excess 6.0 (*)    HCT 35.0 (*)    Hemoglobin 11.9 (*)    All other components within normal limits  RESP PANEL BY RT-PCR (FLU A&B, COVID) [id number]  COMPREHENSIVE METABOLIC PANEL  BLOOD GAS, ARTERIAL    EKG EKG Interpretation  Date/Time:  Wednesday September 12 2020 21:53:14 EST Ventricular Rate:  96 PR Interval:    QRS Duration: 83 QT Interval:  347 QTC Calculation: 439 R Axis:   82 Text Interpretation: Sinus rhythm Borderline prolonged PR interval Consider left atrial enlargement Consider left ventricular hypertrophy Nonspecific T abnormalities, inferior leads ST elevation, consider anterior injury Since last tracing rate faster Confirmed by Wandra Arthurs [phone]) on 09/12/2020 10:08:08 PM   Radiology DG Chest Port 1 View  Result Date: 09/12/2020 CLINICAL DATA:  Intubated EXAM: PORTABLE CHEST 1 VIEW COMPARISON:  06/20/2020 FINDINGS: Single frontal view of the chest demonstrates endotracheal tube overlying tracheal air column tip midway between thoracic inlet and carina. Enteric catheter passes below diaphragm, tip excluded by collimation. Cardiac silhouette is unremarkable. There is bilateral interstitial prominence with multifocal bilateral ground-glass airspace disease. No effusion or pneumothorax. IMPRESSION: 1. No complication after intubation. 2. Multifocal bilateral interstitial and ground-glass opacities, which may reflect atypical infection or edema. The Electronically Signed   By: Randa Ngo M.D.   On: 09/12/2020 22:39   DG Abd Portable 1 View  Result Date: 09/12/2020 CLINICAL DATA:  Enteric catheter placement EXAM: PORTABLE ABDOMEN - 1 VIEW COMPARISON:  None.  FINDINGS: Supine frontal view of the abdomen and pelvis excludes the right flank and pubic symphysis by collimation. Enteric catheter coiled over the gastric fundus. Bowel gas pattern is unremarkable. IMPRESSION: 1. Enteric catheter overlying gastric fundus. Electronically Signed   By: Randa Ngo M.D.   On: 09/12/2020 22:39    Procedures Procedures (including critical care time)  CRITICAL CARE Performed by: Wandra Arthurs   Total critical care time: 40 minutes  Critical care time was exclusive of separately billable procedures and treating other patients.  Critical care was necessary to treat or prevent imminent or life-threatening deterioration.  Critical care was time spent personally by me on the following activities: development of treatment plan with patient and/or surrogate as well as nursing, discussions with consultants, evaluation of patient's response to treatment, examination of patient, obtaining history from patient or surrogate, ordering and performing treatments and interventions, ordering and review of laboratory studies, ordering and review of radiographic studies, pulse oximetry and re-evaluation of patient's condition.  INTUBATION Performed by: Wandra Arthurs  Required items: required blood products, implants, devices, and special equipment available Patient identity confirmed: provided demographic data and hospital-assigned identification number Time out: Immediately prior to procedure a "time out" was called to verify the correct patient, procedure, equipment, support staff and site/side marked as required.  Indications: lethargy  Intubation method: Glidescope Laryngoscopy   Preoxygenation: BVM  Sedatives: 20 mg Etomidate Paralytic: 100 mg rocuronium   Tube Size: 8.0 cuffed  Post-procedure assessment: chest rise and ETCO2 monitor Breath sounds: equal and absent over the epigastrium Tube secured with: ETT holder Chest x-ray interpreted  by radiologist and  me.  Chest x-ray findings: endotracheal tube in appropriate position  Patient tolerated the procedure well with no immediate complications.     Medications Ordered in ED Medications  etomidate (AMIDATE) injection (20 mg Intravenous Given 09/12/20 2201)  rocuronium (ZEMURON) injection (100 mg Intravenous Given 09/12/20 2202)  magnesium sulfate IVPB 2 g 50 mL (2 g Intravenous New Bag/Given 09/12/20 2220)  propofol (DIPRIVAN) 1000 MG/100ML infusion (10 mcg/kg/min  91.2 kg Intravenous New Bag/Given 09/12/20 2218)  methylPREDNISolone sodium succinate (SOLU-MEDROL) 125 mg/2 mL injection 125 mg (125 mg Intravenous Given 09/12/20 2214)  albuterol (PROVENTIL,VENTOLIN) solution continuous neb (20 mg/hr Nebulization Given 09/12/20 2228)  ipratropium (ATROVENT) nebulizer solution 1 mg (1 mg Nebulization Given 09/12/20 2228)    ED Course  I have reviewed the triage vital signs and the nursing notes.  Pertinent labs & imaging results that were available during my care of the patient were reviewed by me and considered in my medical decision making (see chart for details).    MDM Rules/Calculators/A&P                         Kenneth [name] is a 59 y.o. male here presenting with unresponsiveness.  Patient has shortness of breath and now became unresponsive on arrival.  I suspect that patient has hypercapnia and hypoxemia from COPD.  I intubated patient.  Also ordered steroids and magnesium and will put patient on continuous nebs  11:47 PM CXR showed no obvious pulmonary edema.  ET tube in place.  ICU at bedside to admit.  Of note patient became hypotensive and will start Levophed drip.  His EF is 25% so I will hold off on fluids right now.   Final Clinical Impression(s) / ED Diagnoses Final diagnoses:  COPD exacerbation ([place])  Acute respiratory failure with hypoxia [facility])    Rx / DC Orders ED Discharge Orders    None       Drenda Freeze, MD 09/12/20 [phone]

## 2020-09-12 NOTE — ED Notes (Signed)
Propofol paused due to hypotension; pt started moving all 4 extremities. Dr.Yao made aware of blood pressure.

## 2020-09-12 NOTE — ED Notes (Signed)
Contacted [facility] lab for covid results still pending. Tech reported lab was not there; new sample recollected and sent

## 2020-09-13 ENCOUNTER — Telehealth: Payer: Self-pay | Admitting: Adult Health

## 2020-09-13 ENCOUNTER — Inpatient Hospital Stay (HOSPITAL_COMMUNITY): Payer: Medicare Other

## 2020-09-13 DIAGNOSIS — Z79899 Other long term (current) drug therapy: Secondary | ICD-10-CM | POA: Diagnosis not present

## 2020-09-13 DIAGNOSIS — F419 Anxiety disorder, unspecified: Secondary | ICD-10-CM | POA: Diagnosis present

## 2020-09-13 DIAGNOSIS — M1990 Unspecified osteoarthritis, unspecified site: Secondary | ICD-10-CM | POA: Diagnosis present

## 2020-09-13 DIAGNOSIS — Z87891 Personal history of nicotine dependence: Secondary | ICD-10-CM | POA: Diagnosis not present

## 2020-09-13 DIAGNOSIS — G9341 Metabolic encephalopathy: Secondary | ICD-10-CM | POA: Diagnosis present

## 2020-09-13 DIAGNOSIS — Z91018 Allergy to other foods: Secondary | ICD-10-CM | POA: Diagnosis not present

## 2020-09-13 DIAGNOSIS — I351 Nonrheumatic aortic (valve) insufficiency: Secondary | ICD-10-CM

## 2020-09-13 DIAGNOSIS — I5032 Chronic diastolic (congestive) heart failure: Secondary | ICD-10-CM

## 2020-09-13 DIAGNOSIS — J9602 Acute respiratory failure with hypercapnia: Secondary | ICD-10-CM | POA: Diagnosis not present

## 2020-09-13 DIAGNOSIS — Z20822 Contact with and (suspected) exposure to covid-19: Secondary | ICD-10-CM | POA: Diagnosis present

## 2020-09-13 DIAGNOSIS — Z7984 Long term (current) use of oral hypoglycemic drugs: Secondary | ICD-10-CM | POA: Diagnosis not present

## 2020-09-13 DIAGNOSIS — I480 Paroxysmal atrial fibrillation: Secondary | ICD-10-CM | POA: Diagnosis not present

## 2020-09-13 DIAGNOSIS — J441 Chronic obstructive pulmonary disease with (acute) exacerbation: Secondary | ICD-10-CM

## 2020-09-13 DIAGNOSIS — N1831 Chronic kidney disease, stage 3a: Secondary | ICD-10-CM | POA: Diagnosis present

## 2020-09-13 DIAGNOSIS — I110 Hypertensive heart disease with heart failure: Secondary | ICD-10-CM | POA: Diagnosis present

## 2020-09-13 DIAGNOSIS — E785 Hyperlipidemia, unspecified: Secondary | ICD-10-CM | POA: Diagnosis present

## 2020-09-13 DIAGNOSIS — J9601 Acute respiratory failure with hypoxia: Secondary | ICD-10-CM | POA: Diagnosis not present

## 2020-09-13 DIAGNOSIS — R011 Cardiac murmur, unspecified: Secondary | ICD-10-CM | POA: Diagnosis present

## 2020-09-13 DIAGNOSIS — J449 Chronic obstructive pulmonary disease, unspecified: Secondary | ICD-10-CM | POA: Diagnosis not present

## 2020-09-13 DIAGNOSIS — Z8249 Family history of ischemic heart disease and other diseases of the circulatory system: Secondary | ICD-10-CM | POA: Diagnosis not present

## 2020-09-13 DIAGNOSIS — N179 Acute kidney failure, unspecified: Secondary | ICD-10-CM | POA: Diagnosis present

## 2020-09-13 DIAGNOSIS — F32A Depression, unspecified: Secondary | ICD-10-CM | POA: Diagnosis present

## 2020-09-13 DIAGNOSIS — G4733 Obstructive sleep apnea (adult) (pediatric): Secondary | ICD-10-CM | POA: Diagnosis not present

## 2020-09-13 DIAGNOSIS — I350 Nonrheumatic aortic (valve) stenosis: Secondary | ICD-10-CM | POA: Diagnosis not present

## 2020-09-13 DIAGNOSIS — J9600 Acute respiratory failure, unspecified whether with hypoxia or hypercapnia: Secondary | ICD-10-CM | POA: Diagnosis present

## 2020-09-13 DIAGNOSIS — Z7901 Long term (current) use of anticoagulants: Secondary | ICD-10-CM | POA: Diagnosis not present

## 2020-09-13 DIAGNOSIS — Z888 Allergy status to other drugs, medicaments and biological substances status: Secondary | ICD-10-CM | POA: Diagnosis not present

## 2020-09-13 DIAGNOSIS — J439 Emphysema, unspecified: Secondary | ICD-10-CM | POA: Diagnosis present

## 2020-09-13 DIAGNOSIS — E119 Type 2 diabetes mellitus without complications: Secondary | ICD-10-CM | POA: Diagnosis present

## 2020-09-13 DIAGNOSIS — Z833 Family history of diabetes mellitus: Secondary | ICD-10-CM | POA: Diagnosis not present

## 2020-09-13 DIAGNOSIS — I5043 Acute on chronic combined systolic (congestive) and diastolic (congestive) heart failure: Secondary | ICD-10-CM | POA: Diagnosis present

## 2020-09-13 DIAGNOSIS — R0602 Shortness of breath: Secondary | ICD-10-CM | POA: Diagnosis not present

## 2020-09-13 LAB — CBC
HCT: 36.3 % — ABNORMAL LOW (ref 39.0–52.0)
HCT: 34.5 % — ABNORMAL LOW (ref 39.0–52.0)
Hemoglobin: 11 g/dL — ABNORMAL LOW (ref 13.0–17.0)
Hemoglobin: 10.6 g/dL — ABNORMAL LOW (ref 13.0–17.0)
MCH: 28.2 pg (ref 26.0–34.0)
MCH: 28.3 pg (ref 26.0–34.0)
MCHC: 30.3 g/dL (ref 30.0–36.0)
MCHC: 30.7 g/dL (ref 30.0–36.0)
MCV: 93.1 fL (ref 80.0–100.0)
MCV: 92 fL (ref 80.0–100.0)
Platelets: 260 10*3/uL (ref 150–400)
Platelets: 226 10*3/uL (ref 150–400)
RBC: 3.9 MIL/uL — ABNORMAL LOW (ref 4.22–5.81)
RBC: 3.75 MIL/uL — ABNORMAL LOW (ref 4.22–5.81)
RDW: 13.1 % (ref 11.5–15.5)
RDW: 13.2 % (ref 11.5–15.5)
WBC: 13.9 10*3/uL — ABNORMAL HIGH (ref 4.0–10.5)
WBC: 10.1 10*3/uL (ref 4.0–10.5)
nRBC: 0 % (ref 0.0–0.2)
nRBC: 0 % (ref 0.0–0.2)

## 2020-09-13 LAB — GLUCOSE, CAPILLARY
Glucose-Capillary: 278 mg/dL — ABNORMAL HIGH (ref 70–99)
Glucose-Capillary: 157 mg/dL — ABNORMAL HIGH (ref 70–99)
Glucose-Capillary: 161 mg/dL — ABNORMAL HIGH (ref 70–99)
Glucose-Capillary: 164 mg/dL — ABNORMAL HIGH (ref 70–99)
Glucose-Capillary: 117 mg/dL — ABNORMAL HIGH (ref 70–99)
Glucose-Capillary: 213 mg/dL — ABNORMAL HIGH (ref 70–99)
Glucose-Capillary: 127 mg/dL — ABNORMAL HIGH (ref 70–99)

## 2020-09-13 LAB — BASIC METABOLIC PANEL
Anion gap: 13 (ref 5–15)
BUN: 11 mg/dL (ref 6–20)
CO2: 26 mmol/L (ref 22–32)
Calcium: 8.3 mg/dL — ABNORMAL LOW (ref 8.9–10.3)
Chloride: 98 mmol/L (ref 98–111)
Creatinine, Ser: 1.99 mg/dL — ABNORMAL HIGH (ref 0.61–1.24)
GFR, Estimated: 38 mL/min — ABNORMAL LOW (ref >60)
Glucose, Bld: 259 mg/dL — ABNORMAL HIGH (ref 70–99)
Potassium: 3.8 mmol/L (ref 3.5–5.1)
Sodium: 137 mmol/L (ref 135–145)

## 2020-09-13 LAB — ECHOCARDIOGRAM COMPLETE
AR max vel: 1.37 cm2
AV Area VTI: 1.38 cm2
AV Area mean vel: 1.52 cm2
AV Mean grad: 12 mmHg
AV Peak grad: 25 mmHg
Ao pk vel: 2.5 m/s
Area-P 1/2: 3.12 cm2
Height: 72 in
P 1/2 time: 434 msec
S' Lateral: 3.3 cm
Weight: 3262.81 oz

## 2020-09-13 LAB — BRAIN NATRIURETIC PEPTIDE: B Natriuretic Peptide: 605.9 pg/mL — ABNORMAL HIGH (ref 0.0–100.0)

## 2020-09-13 LAB — HEMOGLOBIN A1C
Hgb A1c MFr Bld: 6.3 % — ABNORMAL HIGH (ref 4.8–5.6)
Mean Plasma Glucose: 134.11 mg/dL

## 2020-09-13 LAB — MAGNESIUM: Magnesium: 2.3 mg/dL (ref 1.7–2.4)

## 2020-09-13 LAB — CREATININE, SERUM
Creatinine, Ser: 1.77 mg/dL — ABNORMAL HIGH (ref 0.61–1.24)
GFR, Estimated: 44 mL/min — ABNORMAL LOW (ref >60)

## 2020-09-13 LAB — RESP PANEL BY RT-PCR (FLU A&B, COVID) ARPGX2
Influenza A by PCR: NEGATIVE
Influenza B by PCR: NEGATIVE
SARS Coronavirus 2 by RT PCR: NEGATIVE

## 2020-09-13 LAB — TROPONIN I (HIGH SENSITIVITY)
Troponin I (High Sensitivity): 21 ng/L — ABNORMAL HIGH (ref <18)
Troponin I (High Sensitivity): 19 ng/L — ABNORMAL HIGH (ref <18)

## 2020-09-13 LAB — PHOSPHORUS: Phosphorus: 1.2 mg/dL — ABNORMAL LOW (ref 2.5–4.6)

## 2020-09-13 LAB — MRSA PCR SCREENING: MRSA by PCR: NEGATIVE

## 2020-09-13 LAB — TRIGLYCERIDES: Triglycerides: 181 mg/dL — ABNORMAL HIGH (ref <150)

## 2020-09-13 MED ORDER — ALBUTEROL SULFATE (2.5 MG/3ML) 0.083% IN NEBU: 2.5000 mg | INHALATION_SOLUTION | RESPIRATORY_TRACT | Status: DC | PRN

## 2020-09-13 MED ORDER — ORAL CARE MOUTH RINSE
15.0000 mL | OROMUCOSAL | Status: DC
  Administered 2020-09-13 (×3): 15 mL via OROMUCOSAL

## 2020-09-13 MED ORDER — POLYETHYLENE GLYCOL 3350 17 G PO PACK: 17.0000 g | PACK | Freq: Every day | ORAL | Status: DC

## 2020-09-13 MED ORDER — SODIUM CHLORIDE 0.9 % IV SOLN
500.0000 mg | INTRAVENOUS | Status: DC
  Administered 2020-09-14 – 2020-09-15 (×2): 500 mg via INTRAVENOUS
  Filled 2020-09-13 (×2): qty 500

## 2020-09-13 MED ORDER — METHYLPREDNISOLONE SODIUM SUCC 125 MG IJ SOLR
60.0000 mg | Freq: Four times a day (QID) | INTRAMUSCULAR | Status: DC
  Administered 2020-09-13 – 2020-09-14 (×5): 60 mg via INTRAVENOUS
  Filled 2020-09-13 (×5): qty 2

## 2020-09-13 MED ORDER — DOCUSATE SODIUM 100 MG PO CAPS: 100.0000 mg | ORAL_CAPSULE | Freq: Two times a day (BID) | ORAL | Status: DC | PRN

## 2020-09-13 MED ORDER — CHLORHEXIDINE GLUCONATE 0.12% ORAL RINSE (MEDLINE KIT)
15.0000 mL | Freq: Two times a day (BID) | OROMUCOSAL | Status: DC
  Administered 2020-09-13: 15 mL via OROMUCOSAL

## 2020-09-13 MED ORDER — SODIUM CHLORIDE 0.9 % IV SOLN
500.0000 mg | Freq: Once | INTRAVENOUS | Status: AC
  Administered 2020-09-13: 500 mg via INTRAVENOUS
  Filled 2020-09-13: qty 500

## 2020-09-13 MED ORDER — DOCUSATE SODIUM 50 MG/5ML PO LIQD: 100.0000 mg | Freq: Two times a day (BID) | ORAL | Status: DC

## 2020-09-13 MED ORDER — POLYETHYLENE GLYCOL 3350 17 G PO PACK: 17.0000 g | PACK | Freq: Every day | ORAL | Status: DC | PRN

## 2020-09-13 MED ORDER — ALBUTEROL SULFATE (2.5 MG/3ML) 0.083% IN NEBU
2.5000 mg | INHALATION_SOLUTION | RESPIRATORY_TRACT | Status: DC
  Administered 2020-09-13 – 2020-09-15 (×12): 2.5 mg via RESPIRATORY_TRACT
  Filled 2020-09-13 (×11): qty 3

## 2020-09-13 MED ORDER — SODIUM CHLORIDE 0.9 % IV SOLN
1.0000 g | Freq: Once | INTRAVENOUS | Status: AC
  Administered 2020-09-13: 1 g via INTRAVENOUS
  Filled 2020-09-13: qty 10

## 2020-09-13 MED ORDER — ORAL CARE MOUTH RINSE
15.0000 mL | Freq: Two times a day (BID) | OROMUCOSAL | Status: DC
  Administered 2020-09-15 – 2020-09-16 (×3): 15 mL via OROMUCOSAL

## 2020-09-13 MED ORDER — SODIUM CHLORIDE 0.9 % IV SOLN
1.0000 g | INTRAVENOUS | Status: DC
  Administered 2020-09-13 – 2020-09-14 (×2): 1 g via INTRAVENOUS
  Filled 2020-09-13: qty 10
  Filled 2020-09-13 (×2): qty 1
  Filled 2020-09-13: qty 10

## 2020-09-13 MED ORDER — CHLORHEXIDINE GLUCONATE CLOTH 2 % EX PADS
6.0000 | MEDICATED_PAD | Freq: Every day | CUTANEOUS | Status: DC
  Administered 2020-09-13 – 2020-09-15 (×3): 6 via TOPICAL

## 2020-09-13 MED ORDER — FENTANYL CITRATE (PF) 100 MCG/2ML IJ SOLN
50.0000 ug | INTRAMUSCULAR | Status: DC | PRN
  Administered 2020-09-13 (×3): 100 ug via INTRAVENOUS
  Filled 2020-09-13 (×3): qty 2

## 2020-09-13 MED ORDER — APIXABAN 5 MG PO TABS
5.0000 mg | ORAL_TABLET | Freq: Two times a day (BID) | ORAL | Status: DC
  Administered 2020-09-14: 5 mg
  Filled 2020-09-13: qty 1

## 2020-09-13 MED ORDER — PROSOURCE TF PO LIQD
45.0000 mL | Freq: Two times a day (BID) | ORAL | Status: DC
  Administered 2020-09-13: 45 mL
  Filled 2020-09-13: qty 45

## 2020-09-13 MED ORDER — VITAL HIGH PROTEIN PO LIQD
1000.0000 mL | ORAL | Status: DC
  Filled 2020-09-13: qty 1000

## 2020-09-13 MED ORDER — FENTANYL CITRATE (PF) 100 MCG/2ML IJ SOLN
50.0000 ug | INTRAMUSCULAR | Status: DC | PRN
  Filled 2020-09-13: qty 2

## 2020-09-13 MED ORDER — ENOXAPARIN SODIUM 40 MG/0.4ML ~~LOC~~ SOLN
40.0000 mg | SUBCUTANEOUS | Status: AC
  Administered 2020-09-13: 40 mg via SUBCUTANEOUS
  Filled 2020-09-13: qty 0.4

## 2020-09-13 MED ORDER — PROPOFOL 1000 MG/100ML IV EMUL
0.0000 ug/kg/min | INTRAVENOUS | Status: DC
  Administered 2020-09-13 (×2): 40 ug/kg/min via INTRAVENOUS
  Filled 2020-09-13 (×2): qty 100

## 2020-09-13 MED ORDER — CARVEDILOL 12.5 MG PO TABS
12.5000 mg | ORAL_TABLET | Freq: Two times a day (BID) | ORAL | Status: DC
  Administered 2020-09-13 – 2020-09-16 (×6): 12.5 mg via ORAL
  Filled 2020-09-13 (×7): qty 1

## 2020-09-13 MED ORDER — FUROSEMIDE 10 MG/ML IJ SOLN
40.0000 mg | Freq: Every day | INTRAMUSCULAR | Status: DC
  Administered 2020-09-13: 40 mg via INTRAVENOUS
  Filled 2020-09-13: qty 4

## 2020-09-13 MED ORDER — FENTANYL CITRATE (PF) 100 MCG/2ML IJ SOLN: 50.0000 ug | Freq: Once | INTRAMUSCULAR | Status: AC

## 2020-09-13 MED ORDER — FENTANYL 2500MCG IN NS 250ML (10MCG/ML) PREMIX INFUSION
50.0000 ug/h | INTRAVENOUS | Status: DC
  Administered 2020-09-13: 50 ug/h via INTRAVENOUS
  Filled 2020-09-13: qty 250

## 2020-09-13 MED ORDER — INSULIN ASPART 100 UNIT/ML ~~LOC~~ SOLN
0.0000 [IU] | SUBCUTANEOUS | Status: DC
  Administered 2020-09-13 (×2): 3 [IU] via SUBCUTANEOUS
  Administered 2020-09-13: 5 [IU] via SUBCUTANEOUS
  Administered 2020-09-13: 8 [IU] via SUBCUTANEOUS
  Administered 2020-09-14: 3 [IU] via SUBCUTANEOUS
  Administered 2020-09-14 (×2): 2 [IU] via SUBCUTANEOUS
  Administered 2020-09-14: 3 [IU] via SUBCUTANEOUS

## 2020-09-13 MED ORDER — PANTOPRAZOLE SODIUM 40 MG IV SOLR
40.0000 mg | Freq: Every day | INTRAVENOUS | Status: DC
  Administered 2020-09-13 – 2020-09-15 (×4): 40 mg via INTRAVENOUS
  Filled 2020-09-13 (×4): qty 40

## 2020-09-13 MED ORDER — DOCUSATE SODIUM 50 MG/5ML PO LIQD
100.0000 mg | Freq: Two times a day (BID) | ORAL | Status: DC
  Administered 2020-09-13: 100 mg
  Filled 2020-09-13 (×2): qty 10

## 2020-09-13 NOTE — ED Notes (Signed)
Pink froth noted to ET tube. Pt restless on the vent. Medications adjusted for sedation

## 2020-09-13 NOTE — Progress Notes (Signed)
  Echocardiogram 2D Echocardiogram has been performed.  Kenneth [name] 09/13/2020, 9:39 AM

## 2020-09-13 NOTE — Progress Notes (Signed)
Patient transported to CT and back without any complications.  

## 2020-09-13 NOTE — Progress Notes (Signed)
   09/13/20 1433  Clinical Encounter Type  Visited With Patient  Visit Type Spiritual support  Referral From Nurse  Consult/Referral To Chaplain  Chaplain responded. Patient stated his brother from [facility] came to visit. Patient asked when he would be allowed to go home.  He also asked for the bag with his belongings, and he took out his cell phone. Chaplain offered ministry of social support and prayer.  This note was prepared by Deneen Harts, M.Div..  For questions please contact by phone [phone].

## 2020-09-13 NOTE — Progress Notes (Signed)
eLink Physician-Brief Progress Note Patient Name: Kenneth [name] DOB: [birth year] MRN: [id number]   Date of Service  09/13/2020  HPI/Events of Note  Brief new admit note: already seen by bed side CCU team.  59 AAM, from home admitted for AMS/encephalopathy, AHRF. Hx of COPD on steroids since 15 th. P afib, DM 2, HTN. HLD. OSA.  Camera: In synchrony with vent, 620/02/07/39%. PIP 17. 6 foot tall. HR 78, 100% sats, MAP 73. On Levo at 14mcg/min, propofol sedation.   Data: Reviewed CxR: Multifocal bilateral interstitial and ground-glass opacities, which may reflect atypical infection or edema.  Acute respiratory failure, Type on Vent. Covid/flu neg.  Severe COPD exacerbation History of congestive heart failure. Trop at 21.  Altered level of consciousness   eICU Interventions  - continue care as per Dr Cristal Deer. CTh neg. - VAP protocol (if indicated): Initiated. On CAP abx. Trend troponin. Nebs/steroids.  DVT prophylaxis: Lovenox GI prophylaxis: Protonix Glucose control: Monitor blood glucose levels     Intervention Category Major Interventions: Respiratory failure - evaluation and management Evaluation Type: New Patient Evaluation  [name] 09/13/2020, 3:29 AM

## 2020-09-13 NOTE — Progress Notes (Signed)
Pt was extubated per verbal order from Dr. Johny Drilling, and placed on 4L nasal cannula. BBS heard throughout. Kenneth [name] is not in any distress at this time.

## 2020-09-13 NOTE — Progress Notes (Signed)
eLink Physician-Brief Progress Note Patient Name: Kenneth [name] DOB: Oct 30, 1960 MRN: [id number]   Date of Service  09/13/2020  HPI/Events of Note  Pt's admit gluc 278.  Pt received steroids & has h/o DM, no SSI or accuchecks ordered.  RN asking for orders.  Also asking for fentanyl gtt order.  Pt. has received 100 mcg x2 IV fentanyl pushes in last hr, still having agitation.  On propofol gtt already.  eICU Interventions  Ordered fenta gtt and ssi.      Intervention Category Intermediate Interventions: Hyperglycemia - evaluation and treatment;Other:  [name] 09/13/2020, 3:44 AM

## 2020-09-13 NOTE — Progress Notes (Addendum)
NAME:  Kenneth [name], MRN:  [id number], DOB:  09-30-1960, LOS: 0 ADMISSION DATE:  09/12/2020,   CHIEF COMPLAINT: Respiratory distress with altered level of consciousness  Brief History:  59 year old male that presented with altered level of consciousness and respiratory distress.  History of Present Illness:  This is a 59 year old black male that presented from home to the emergency room 12/22.  The patient had been found less responsive and was having difficulty breathing therefore EMS was called by family.  Patient was able to ambulate a couple steps to the stretcher.  By the time patient arrived to the emergency room he was unresponsive and required emergent intubation.  But reviewing the medical records it appears that the patient has had 2 months of difficulty with his COPD symptoms.  Most recently on 09/05/2020 patient was placed on prednisone for COPD exacerbation.  Patient was vaccinated for Covid in April of this year.  Baseline the patient uses 2 L nasal cannula during the day and CPAP at night.  Past Medical History:  COPD Systolic heart failure Paroxysmal atrial fibrillation Anxiety Depression Diabetes mellitus type 2 Hypertension Hyperlipidemia OSA   Significant Diagnostic Tests:  CT head 12/22>> neg acute   Micro Data:  Covid negative Influenza negative  Antimicrobials:  Rocephin 12/22>>> Azithromycin 12/22>>>  Objective   Blood pressure (!) 97/52, pulse 63, temperature 97.6 F (36.4 C), temperature source Axillary, resp. rate 18, height 6' (1.829 m), weight 92.5 kg, SpO2 97 %.    Vent Mode: PRVC FiO2 (%):  [40 %-80 %] 40 % Set Rate:  [18 bmp] 18 bmp Vt Set:  [620 mL] 620 mL PEEP:  [5 cmH20] 5 cmH20 Plateau Pressure:  [17 cmH20-27 cmH20] 17 cmH20   Intake/Output Summary (Last 24 hours) at 09/13/2020 0849 Last data filed at 09/13/2020 0800 Gross per 24 hour  Intake 553.11 ml  Output --  Net 553.11 ml   Filed Weights   09/12/20 2250 09/13/20 0330   Weight: 91 kg 92.5 kg    Examination: General: sedated, NAD on vent HENT: mm moist, ETT  Lungs: resps even non labored on vent, few exp wheezes, no crackles or rhonhi  Cardiovascular: Regular rate no murmur rub or gallop appreciated Abdomen: Soft, nondistended, positive bowel sounds Extremities: warm and dry, no sig edema  Neuro: RASS -3, weaning sedation, perrla 70mm     Assessment & Plan:  Acute respiratory failure - likely r/t AECOPD +/- mild volume overload Severe COPD exacerbation PLAN -  Vent support - 8cc/kg  F/u CXR  F/u ABG Continue IV steroids  Empiric abx as above  Albuterol 2.5 mg nebulized every 4 hours and every 2 hours as needed Sputum culture   History of congestive heart failure PLAN -  Echo pending  No diuresis for now given soft BP Trend troponin F/u BNP  Hx AFib  HTN PLAN -  Holding home norvasc, coreg, losartan Holding eliquis    Altered level of consciousness PLAN -  Holding home trazodone, buspar, gabapentin, abilify  CT head neg  PAD protocol while on vent   DM  PLAN -  Holding metformin  SSI  Monitor closely on steroids    Best practice (evaluated daily)  Diet: TF Pain/Anxiety/Delirium protocol (if indicated): Propofol/fentanyl VAP protocol (if indicated): Initiated DVT prophylaxis: Lovenox GI prophylaxis: Protonix Glucose control: Monitor blood glucose levels Mobility: Bedrest Disposition: ICU  Goals of Care:  Code Status: Full code  Labs   CBC: Recent Labs  Lab 09/12/20 2230 09/12/20 2240  09/12/20 2256 09/13/20 0039 09/13/20 0408  WBC 6.2  --   --  13.9* 10.1  NEUTROABS 3.0  --   --   --   --   HGB 12.0* 13.3 11.9* 11.0* 10.6*  HCT 40.3 39.0 35.0* 36.3* 34.5*  MCV 93.9  --   --  93.1 92.0  PLT 271  --   --  260 226    Basic Metabolic Panel: Recent Labs  Lab 09/12/20 2230 09/12/20 2240 09/12/20 2256 09/13/20 0039 09/13/20 0408  NA 137 139 137  --  137  K 3.9 3.8 3.8  --  3.8  CL 99 97*  --   --   98  CO2 27  --   --   --  26  GLUCOSE 189* 184*  --   --  259*  BUN 7 8  --   --  11  CREATININE 1.58* 1.60*  --  1.77* 1.99*  CALCIUM 8.5*  --   --   --  8.3*  MG  --   --   --   --  2.3   GFR: Estimated Creatinine Clearance: 43.9 mL/min (A) (by C-G formula based on SCr of 1.99 mg/dL (H)). Recent Labs  Lab 09/12/20 2230 09/13/20 0039 09/13/20 0408  WBC 6.2 13.9* 10.1    Liver Function Tests: Recent Labs  Lab 09/12/20 2230  AST 26  ALT 18  ALKPHOS 60  BILITOT 0.7  PROT 6.3*  ALBUMIN 3.6   No results for input(s): LIPASE, AMYLASE in the last 168 hours. No results for input(s): AMMONIA in the last 168 hours.  ABG    Component Value Date/Time   PHART 7.372 09/12/2020 2256   PCO2ART 56.2 (H) 09/12/2020 2256   PO2ART 200 (H) 09/12/2020 2256   HCO3 32.9 (H) 09/12/2020 2256   TCO2 35 (H) 09/12/2020 2256   ACIDBASEDEF 1.0 06/12/2019 1347   O2SAT 100.0 09/12/2020 2256     Coagulation Profile: No results for input(s): INR, PROTIME in the last 168 hours.  Cardiac Enzymes: No results for input(s): CKTOTAL, CKMB, CKMBINDEX, TROPONINI in the last 168 hours.  HbA1C: Hgb A1c MFr Bld  Date/Time Value Ref Range Status  09/13/2020 04:08 AM 6.3 (H) 4.8 - 5.6 % Final    Comment:    (NOTE) Pre diabetes:          5.7%-6.4%  Diabetes:              >6.4%  Glycemic control for   <7.0% adults with diabetes   06/21/2020 09:14 AM 6.8 (H) 4.8 - 5.6 % Final    Comment:    (NOTE) Pre diabetes:          5.7%-6.4%  Diabetes:              >6.4%  Glycemic control for   <7.0% adults with diabetes     CBG: Recent Labs  Lab 09/13/20 0334 09/13/20 0736 09/13/20 0828  GLUCAP 278* 161* 157*     Critical care time:      Dirk Dress, NP Pulmonary/Critical Care Medicine  09/13/2020  8:49 AM

## 2020-09-13 NOTE — Progress Notes (Signed)
Nutrition Brief Note  Received MD Consult for TF initiation and management. Patient has been extubated and no longer needs tube feeding. No nutrition interventions indicated at this time. Please re-consult RD if nutrition concerns arise.   Gabriel Rainwater, RD, LDN, CNSC Please refer to [facility] for contact information.

## 2020-09-13 NOTE — Telephone Encounter (Signed)
Called and spoke to [place] with [employer] and was advised to add "stationary concentrator" to the current wording of the last order. New order placed. Nothing further needed.

## 2020-09-14 ENCOUNTER — Inpatient Hospital Stay (HOSPITAL_COMMUNITY): Payer: Medicare Other

## 2020-09-14 ENCOUNTER — Other Ambulatory Visit: Payer: Self-pay

## 2020-09-14 ENCOUNTER — Encounter (HOSPITAL_COMMUNITY): Payer: Self-pay | Admitting: Pulmonary Disease

## 2020-09-14 DIAGNOSIS — J9602 Acute respiratory failure with hypercapnia: Secondary | ICD-10-CM

## 2020-09-14 DIAGNOSIS — J9601 Acute respiratory failure with hypoxia: Secondary | ICD-10-CM | POA: Diagnosis not present

## 2020-09-14 LAB — BASIC METABOLIC PANEL
Anion gap: 9 (ref 5–15)
BUN: 20 mg/dL (ref 6–20)
CO2: 31 mmol/L (ref 22–32)
Calcium: 8.8 mg/dL — ABNORMAL LOW (ref 8.9–10.3)
Chloride: 98 mmol/L (ref 98–111)
Creatinine, Ser: 1.55 mg/dL — ABNORMAL HIGH (ref 0.61–1.24)
GFR, Estimated: 51 mL/min — ABNORMAL LOW (ref >60)
Glucose, Bld: 86 mg/dL (ref 70–99)
Potassium: 4.4 mmol/L (ref 3.5–5.1)
Sodium: 138 mmol/L (ref 135–145)

## 2020-09-14 LAB — CBC
HCT: 34.7 % — ABNORMAL LOW (ref 39.0–52.0)
Hemoglobin: 10.5 g/dL — ABNORMAL LOW (ref 13.0–17.0)
MCH: 27.7 pg (ref 26.0–34.0)
MCHC: 30.3 g/dL (ref 30.0–36.0)
MCV: 91.6 fL (ref 80.0–100.0)
Platelets: 207 10*3/uL (ref 150–400)
RBC: 3.79 MIL/uL — ABNORMAL LOW (ref 4.22–5.81)
RDW: 13.4 % (ref 11.5–15.5)
WBC: 13.4 10*3/uL — ABNORMAL HIGH (ref 4.0–10.5)
nRBC: 0 % (ref 0.0–0.2)

## 2020-09-14 LAB — GLUCOSE, CAPILLARY
Glucose-Capillary: 91 mg/dL (ref 70–99)
Glucose-Capillary: 162 mg/dL — ABNORMAL HIGH (ref 70–99)
Glucose-Capillary: 148 mg/dL — ABNORMAL HIGH (ref 70–99)
Glucose-Capillary: 196 mg/dL — ABNORMAL HIGH (ref 70–99)
Glucose-Capillary: 82 mg/dL (ref 70–99)

## 2020-09-14 LAB — PROCALCITONIN: Procalcitonin: <0.1 ng/mL

## 2020-09-14 LAB — TRIGLYCERIDES: Triglycerides: 66 mg/dL (ref <150)

## 2020-09-14 LAB — BRAIN NATRIURETIC PEPTIDE: B Natriuretic Peptide: 925.1 pg/mL — ABNORMAL HIGH (ref 0.0–100.0)

## 2020-09-14 MED ORDER — DOCUSATE SODIUM 50 MG/5ML PO LIQD
100.0000 mg | Freq: Two times a day (BID) | ORAL | Status: DC
  Administered 2020-09-14: 100 mg via ORAL
  Filled 2020-09-14 (×3): qty 10

## 2020-09-14 MED ORDER — LOSARTAN POTASSIUM 50 MG PO TABS: 100.0000 mg | ORAL_TABLET | Freq: Every day | ORAL | Status: DC

## 2020-09-14 MED ORDER — POLYETHYLENE GLYCOL 3350 17 G PO PACK: 17.0000 g | PACK | Freq: Every day | ORAL | Status: DC | PRN

## 2020-09-14 MED ORDER — APIXABAN 5 MG PO TABS
5.0000 mg | ORAL_TABLET | Freq: Two times a day (BID) | ORAL | Status: DC
  Administered 2020-09-14 – 2020-09-16 (×4): 5 mg via ORAL
  Filled 2020-09-14 (×4): qty 1

## 2020-09-14 MED ORDER — INSULIN ASPART 100 UNIT/ML ~~LOC~~ SOLN
0.0000 [IU] | Freq: Three times a day (TID) | SUBCUTANEOUS | Status: DC
  Administered 2020-09-15 – 2020-09-16 (×4): 3 [IU] via SUBCUTANEOUS

## 2020-09-14 MED ORDER — METHYLPREDNISOLONE SODIUM SUCC 125 MG IJ SOLR
60.0000 mg | Freq: Two times a day (BID) | INTRAMUSCULAR | Status: DC
  Administered 2020-09-14 – 2020-09-15 (×2): 60 mg via INTRAVENOUS
  Filled 2020-09-14 (×2): qty 2

## 2020-09-14 MED ORDER — ACETAMINOPHEN 325 MG PO TABS
650.0000 mg | ORAL_TABLET | Freq: Four times a day (QID) | ORAL | Status: DC | PRN
  Administered 2020-09-14 – 2020-09-16 (×4): 650 mg via ORAL
  Filled 2020-09-14 (×4): qty 2

## 2020-09-14 MED ORDER — ISOSORBIDE MONONITRATE ER 30 MG PO TB24
30.0000 mg | ORAL_TABLET | Freq: Every day | ORAL | Status: DC
  Administered 2020-09-14 – 2020-09-16 (×3): 30 mg via ORAL
  Filled 2020-09-14 (×3): qty 1

## 2020-09-14 NOTE — Progress Notes (Signed)
[facility] chat that patient has CBG scheduled ACHS but insulin Q 4 asked for clarification. Ilean Skill LPN

## 2020-09-14 NOTE — Progress Notes (Signed)
PROGRESS NOTE  Kenneth [name]  DOB: [birth year]  PCP: Mliss Sax, MD AUQ:[id number]  DOA: 09/12/2020  LOS: 1 day   Chief Complaint  Patient presents with  . Respiratory Distress   Brief narrative: Kenneth [name] is a 59 y.o. male with PMH significant for T2DM, HTN, HLD, systolic CHF, proximal A. fib, COPD, with 2 L oxygen during the day and OSA with nightly CPAP, anxiety, depression. Patient was brought to the ED from home on 12/22 for difficulty breathing and decreased responsiveness.  For last few weeks, patient was having more pronounced COPD symptoms.  Most recently on 09/05/2020, patient was placed on a course of prednisone.  Symptoms persisted despite compliance to medications and more than normal use of DuoNeb. EMS brought him to ED on a CPAP. By the time of arrival to the ED, patient was unresponsive and required emergent intubation. Blood gas obtained after intubation showed normal pH with PCO2 elevated to 56 and serum bicarbonate elevated at 33.  Creatinine was elevated to 1.58 Chest x-ray showed multifocal bilateral interstitial and ground-glass opacities, which may reflect atypical infection or edema. Patient was admitted to ICU, extubated next day and transfered to hospital service.  Subjective: Patient was seen and examined this morning. Middle-aged African-American male.  Sitting up in chair.  On 4 L oxygen by nasal cannula.  Feels better than at presentation.  Assessment/Plan: Acute exacerbation of COPD Multifocal pneumonia -Presented with progressively worsening shortness of breath leading to unresponsiveness. -Chest x-ray showed multifocal bilateral interstitial groundglass opacities -Continue IV Rocephin and IV azithromycin. -Currently on Solu-Medrol IV 60 mg every 6 hours.  Will taper down to 60 mg IV twice daily today. -WBC up today likely because of steroids.  Obtain procalcitonin level Recent Labs  Lab 09/12/20 2230 09/13/20 0039 09/13/20 0408  09/14/20 0100  WBC 6.2 13.9* 10.1 13.4*   Acute on chronic respiratory failure with hypercapnia Obstructive sleep apnea -At home, patient was using 2 L oxygen during the day and oxygen through CPAP at night. -Emergently intubated in the ED, admitted to ICU. Extubated next day and transfered to hospitalist service. -Currently on 3 L oxygen by nasal cannula.  Wean down as tolerated.  Mild exacerbation of chronic diastolic congestive heart failure Essential hypertension -Patient probably has a component of CHF exacerbation as well.  BNP elevated to 600.  Patient was given a dose of IV Lasix. -Home meds include Coreg 12.5 mg twice daily, amlodipine 5 mg daily, Lasix 40 mg twice daily, Imdur 30 mg daily, losartan 100 mg daily -Currently on Coreg 12.5 mg twice daily and Lasix 40 mg IV daily. -Blood pressure in 140s but creatinine up to 1.99 today.  Continue Coreg.  Hold further dose of Lasix.  Resume Imdur.  Continue to hold losartan. -Echo 12/23 with EF 60 to 65%, moderate concentric LVH, grade 1 diastolic dysfunction, mildly elevated pulmonary artery pressure, mild to moderate aortic regurgitation. -Net IO Since Admission: -15.98 mL [09/14/20 0755] -Continue to monitor for daily intake output, weight, blood pressure, BNP, renal function and electrolytes. Recent Labs  Lab 09/12/20 2230 09/12/20 2240 09/12/20 2256 09/13/20 0039 09/13/20 0408 09/14/20 0100  BNP  --   --   --  605.9*  --   --   BUN 7 8  --   --  11 20  CREATININE 1.58* 1.60*  --  1.77* 1.99* 1.55*  K 3.9 3.8 3.8  --  3.8 4.4  MG  --   --   --   --  2.3  --    Paroxysmal A. Fib -EKG on admission showed sinus rhythm. -On Coreg and Eliquis. Continue the same.  AKI on CKD 3a -Creatinine was elevated 1.58 on presentation.  Peaked at 1.99 on 12/23.  Improving today.  Continue to monitor. Recent Labs    04/15/20 0715 04/16/20 0703 06/17/20 1157 06/20/20 1500 06/22/20 0835 07/05/20 0853 09/12/20 2230 09/12/20 2240  09/13/20 0039 09/13/20 0408 09/14/20 0100  BUN 15 23* 9 13 24* 9 7 8   --  11 20  CREATININE 1.13 1.38* 1.35* 1.36* 1.34* 1.10 1.58* 1.60* 1.77* 1.99* 1.55*   Acute metabolic encephalopathy -Altered mental status at presentation probably secondary to hypercapnia.  Also multiple psych medicines at home. -CT head negative. -Mental status improved back to normal  Type 2 diabetes mellitus -A1c 6.3 on 12/23. -On Metformin 500 mg twice daily at home.  Currently on hold.  Continue sliding scale insulin with Accu-Cheks.  Recent Labs  Lab 09/13/20 1535 09/13/20 1919 09/13/20 2321 09/14/20 0318 09/14/20 0749  GLUCAP 117* 213* 127* 91 162*   Hyperlipidemia -Continue Crestor.  Anxiety/depression -Home meds include Abilify 5 mg daily, BuSpar 15 mg twice daily, Prozac 60 mg daily, Neurontin 300 mg 3 times daily, trazodone 25 to 50 mg at bedtime as needed. -Resume gradually  Mobility: Encourage ambulation on the hallway Code Status:   Code Status: Full Code  Nutritional status: Body mass index is 27.66 kg/m.     Diet Order            Diet heart healthy/carb modified Room service appropriate? Yes; Fluid consistency: Thin  Diet effective now                 DVT prophylaxis: SCDs Start: 09/13/20 0024 apixaban (ELIQUIS) tablet 5 mg   Antimicrobials:  Ceftriaxone, azithromycin Fluid: None Consultants: Critical care Family Communication:  None at bedside  Status is: Inpatient   Remains inpatient appropriate because: On IV antibiotics, medicines being titrated   Dispo: The patient is from: Home              Anticipated d/c is to: Home              Anticipated d/c date is: Likely tomorrow              Patient currently is not medically stable to d/c.       Infusions:  . azithromycin 500 mg (09/14/20 0135)  . cefTRIAXone (ROCEPHIN)  IV 1 g (09/13/20 2155)    Scheduled Meds: . albuterol  2.5 mg Nebulization Q4H  . apixaban  5 mg Per Tube BID  . carvedilol  12.5 mg  Oral BID WC  . Chlorhexidine Gluconate Cloth  6 each Topical Daily  . docusate  100 mg Per Tube BID  . furosemide  40 mg Intravenous Daily  . insulin aspart  0-15 Units Subcutaneous Q4H  . mouth rinse  15 mL Mouth Rinse BID  . methylPREDNISolone (SOLU-MEDROL) injection  60 mg Intravenous Q6H  . pantoprazole (PROTONIX) IV  40 mg Intravenous QHS    Antimicrobials: Anti-infectives (From admission, onward)   Start     Dose/Rate Route Frequency Ordered Stop   09/14/20 0000  azithromycin (ZITHROMAX) 500 mg in sodium chloride 0.9 % 250 mL IVPB        500 mg 250 mL/hr over 60 Minutes Intravenous Every 24 hours 09/13/20 0029     09/13/20 2200  cefTRIAXone (ROCEPHIN) 1 g in sodium chloride 0.9 % 100 mL  IVPB        1 g 200 mL/hr over 30 Minutes Intravenous Every 24 hours 09/13/20 0029     09/13/20 0115  azithromycin (ZITHROMAX) 500 mg in sodium chloride 0.9 % 250 mL IVPB        500 mg 250 mL/hr over 60 Minutes Intravenous  Once 09/13/20 0033 09/13/20 0338   09/13/20 0045  cefTRIAXone (ROCEPHIN) 1 g in sodium chloride 0.9 % 100 mL IVPB        1 g 200 mL/hr over 30 Minutes Intravenous  Once 09/13/20 0033 09/13/20 0127      PRN meds: albuterol, polyethylene glycol   Objective: Vitals:   09/14/20 0600 09/14/20 0700  BP: (!) 144/77 (!) 165/90  Pulse: 84 93  Resp: 15 16  Temp:    SpO2: 100% 99%    Intake/Output Summary (Last 24 hours) at 09/14/2020 0738 Last data filed at 09/14/2020 0700 Gross per 24 hour  Intake 607.7 ml  Output 1150 ml  Net -542.3 ml   Filed Weights   09/12/20 2250 09/13/20 0330  Weight: 91 kg 92.5 kg   Weight change:  Body mass index is 27.66 kg/m.   Physical Exam: General exam: Pleasant, middle-aged African-American male.  Not in distress Skin: No rashes, lesions or ulcers. HEENT: Atraumatic, normocephalic, no obvious bleeding Lungs: Clear to auscultation bilaterally CVS: Regular rate and rhythm, no murmur GI/Abd soft, nontender, nondistended, bowel  sound present CNS: Alert, awake, oriented x3 Psychiatry: Mood appropriate Extremities: No pedal edema, no calf tenderness  Data Review: I have personally reviewed the laboratory data and studies available.  Recent Labs  Lab 09/12/20 2230 09/12/20 2240 09/12/20 2256 09/13/20 0039 09/13/20 0408 09/14/20 0100  WBC 6.2  --   --  13.9* 10.1 13.4*  NEUTROABS 3.0  --   --   --   --   --   HGB 12.0* 13.3 11.9* 11.0* 10.6* 10.5*  HCT 40.3 39.0 35.0* 36.3* 34.5* 34.7*  MCV 93.9  --   --  93.1 92.0 91.6  PLT 271  --   --  260 226 207   Recent Labs  Lab 09/12/20 2230 09/12/20 2240 09/12/20 2256 09/13/20 0039 09/13/20 0408 09/14/20 0100  NA 137 139 137  --  137 138  K 3.9 3.8 3.8  --  3.8 4.4  CL 99 97*  --   --  98 98  CO2 27  --   --   --  26 31  GLUCOSE 189* 184*  --   --  259* 86  BUN 7 8  --   --  11 20  CREATININE 1.58* 1.60*  --  1.77* 1.99* 1.55*  CALCIUM 8.5*  --   --   --  8.3* 8.8*  MG  --   --   --   --  2.3  --   PHOS  --   --   --   --  1.2*  --     F/u labs ordered  Signed, Lorin Glass, MD [facility] 09/14/2020

## 2020-09-14 NOTE — Plan of Care (Signed)

## 2020-09-15 DIAGNOSIS — J9601 Acute respiratory failure with hypoxia: Secondary | ICD-10-CM | POA: Diagnosis not present

## 2020-09-15 DIAGNOSIS — J9602 Acute respiratory failure with hypercapnia: Secondary | ICD-10-CM | POA: Diagnosis not present

## 2020-09-15 LAB — CBC WITH DIFFERENTIAL/PLATELET
Abs Immature Granulocytes: 0.06 10*3/uL (ref 0.00–0.07)
Basophils Absolute: 0 10*3/uL (ref 0.0–0.1)
Basophils Relative: 0 %
Eosinophils Absolute: 0 10*3/uL (ref 0.0–0.5)
Eosinophils Relative: 0 %
HCT: 36.2 % — ABNORMAL LOW (ref 39.0–52.0)
Hemoglobin: 10.8 g/dL — ABNORMAL LOW (ref 13.0–17.0)
Immature Granulocytes: 1 %
Lymphocytes Relative: 2 %
Lymphs Abs: 0.2 10*3/uL — ABNORMAL LOW (ref 0.7–4.0)
MCH: 27.6 pg (ref 26.0–34.0)
MCHC: 29.8 g/dL — ABNORMAL LOW (ref 30.0–36.0)
MCV: 92.3 fL (ref 80.0–100.0)
Monocytes Absolute: 0.2 10*3/uL (ref 0.1–1.0)
Monocytes Relative: 1 %
Neutro Abs: 12.9 10*3/uL — ABNORMAL HIGH (ref 1.7–7.7)
Neutrophils Relative %: 96 %
Platelets: 222 10*3/uL (ref 150–400)
RBC: 3.92 MIL/uL — ABNORMAL LOW (ref 4.22–5.81)
RDW: 13.7 % (ref 11.5–15.5)
WBC: 13.3 10*3/uL — ABNORMAL HIGH (ref 4.0–10.5)
nRBC: 0 % (ref 0.0–0.2)

## 2020-09-15 LAB — GLUCOSE, CAPILLARY
Glucose-Capillary: 168 mg/dL — ABNORMAL HIGH (ref 70–99)
Glucose-Capillary: 96 mg/dL (ref 70–99)
Glucose-Capillary: 170 mg/dL — ABNORMAL HIGH (ref 70–99)
Glucose-Capillary: 186 mg/dL — ABNORMAL HIGH (ref 70–99)

## 2020-09-15 LAB — BASIC METABOLIC PANEL
Anion gap: 10 (ref 5–15)
BUN: 27 mg/dL — ABNORMAL HIGH (ref 6–20)
CO2: 29 mmol/L (ref 22–32)
Calcium: 8.9 mg/dL (ref 8.9–10.3)
Chloride: 96 mmol/L — ABNORMAL LOW (ref 98–111)
Creatinine, Ser: 1.17 mg/dL (ref 0.61–1.24)
GFR, Estimated: >60 mL/min (ref >60)
Glucose, Bld: 211 mg/dL — ABNORMAL HIGH (ref 70–99)
Potassium: 4.6 mmol/L (ref 3.5–5.1)
Sodium: 135 mmol/L (ref 135–145)

## 2020-09-15 LAB — BRAIN NATRIURETIC PEPTIDE: B Natriuretic Peptide: 1221.6 pg/mL — ABNORMAL HIGH (ref 0.0–100.0)

## 2020-09-15 LAB — TRIGLYCERIDES: Triglycerides: 193 mg/dL — ABNORMAL HIGH (ref <150)

## 2020-09-15 LAB — PROCALCITONIN: Procalcitonin: <0.1 ng/mL

## 2020-09-15 MED ORDER — FUROSEMIDE 10 MG/ML IJ SOLN
40.0000 mg | Freq: Every day | INTRAMUSCULAR | Status: DC
  Administered 2020-09-15 – 2020-09-16 (×2): 40 mg via INTRAVENOUS
  Filled 2020-09-15 (×2): qty 4

## 2020-09-15 MED ORDER — ALBUTEROL SULFATE (2.5 MG/3ML) 0.083% IN NEBU
2.5000 mg | INHALATION_SOLUTION | Freq: Four times a day (QID) | RESPIRATORY_TRACT | Status: DC
  Administered 2020-09-15 (×4): 2.5 mg via RESPIRATORY_TRACT
  Filled 2020-09-15 (×4): qty 3

## 2020-09-15 MED ORDER — GUAIFENESIN ER 600 MG PO TB12
600.0000 mg | ORAL_TABLET | Freq: Two times a day (BID) | ORAL | Status: DC
  Administered 2020-09-15 – 2020-09-16 (×3): 600 mg via ORAL
  Filled 2020-09-15 (×3): qty 1

## 2020-09-15 MED ORDER — METHYLPREDNISOLONE SODIUM SUCC 125 MG IJ SOLR
60.0000 mg | Freq: Every day | INTRAMUSCULAR | Status: DC
  Administered 2020-09-16: 60 mg via INTRAVENOUS
  Filled 2020-09-15: qty 2

## 2020-09-15 MED ORDER — HYDRALAZINE HCL 25 MG PO TABS
25.0000 mg | ORAL_TABLET | Freq: Three times a day (TID) | ORAL | Status: DC
  Administered 2020-09-15: 25 mg via ORAL
  Filled 2020-09-15: qty 1

## 2020-09-15 MED ORDER — FUROSEMIDE 40 MG PO TABS: 40.0000 mg | ORAL_TABLET | Freq: Once | ORAL | Status: DC

## 2020-09-15 NOTE — Plan of Care (Signed)
  Problem: Education: Goal: Knowledge of disease or condition will improve Outcome: Progressing Goal: Knowledge of the prescribed therapeutic regimen will improve Outcome: Progressing   Problem: Activity: Goal: Ability to tolerate increased activity will improve Outcome: Progressing Goal: Will verbalize the importance of balancing activity with adequate rest periods Outcome: Progressing   Problem: Respiratory: Goal: Ability to maintain a clear airway will improve Outcome: Progressing Goal: Levels of oxygenation will improve Outcome: Progressing Goal: Ability to maintain adequate ventilation will improve Outcome: Progressing   Problem: Education: Goal: Knowledge of General Education information will improve Description: Including pain rating scale, medication(s)/side effects and non-pharmacologic comfort measures Outcome: Progressing   Problem: Health Behavior/Discharge Planning: Goal: Ability to manage health-related needs will improve Outcome: Progressing   Problem: Clinical Measurements: Goal: Ability to maintain clinical measurements within normal limits will improve Outcome: Progressing Goal: Will remain free from infection Outcome: Progressing Goal: Diagnostic test results will improve Outcome: Progressing Goal: Respiratory complications will improve Outcome: Progressing   Problem: Activity: Goal: Risk for activity intolerance will decrease Outcome: Progressing   Problem: Nutrition: Goal: Adequate nutrition will be maintained Outcome: Progressing   Problem: Coping: Goal: Level of anxiety will decrease Outcome: Progressing   Problem: Elimination: Goal: Will not experience complications related to bowel motility Outcome: Progressing Goal: Will not experience complications related to urinary retention Outcome: Progressing   Problem: Pain Managment: Goal: General experience of comfort will improve Outcome: Progressing   Problem: Safety: Goal: Ability to  remain free from injury will improve Outcome: Progressing   Problem: Skin Integrity: Goal: Risk for impaired skin integrity will decrease Outcome: Progressing

## 2020-09-15 NOTE — Progress Notes (Signed)
PROGRESS NOTE  Kenneth [name]  DOB: 1960-10-24  PCP: Mliss Sax, MD AOZ:[id number]  DOA: 09/12/2020  LOS: 2 days   Chief Complaint  Patient presents with  . Respiratory Distress   Brief narrative: Kenneth [name] is a 59 y.o. male with PMH significant for T2DM, HTN, HLD, systolic CHF, proximal A. fib, COPD, with 2 L oxygen during the day and OSA with nightly CPAP, anxiety, depression. Patient was brought to the ED from home on 12/22 for difficulty breathing and decreased responsiveness.  For last few weeks, patient was having more pronounced COPD symptoms.  Most recently on 09/05/2020, patient was placed on a course of prednisone.  Symptoms persisted despite compliance to medications and more than normal use of DuoNeb. EMS brought him to ED on a CPAP. By the time of arrival to the ED, patient was unresponsive and required emergent intubation. Blood gas obtained after intubation showed normal pH with PCO2 elevated to 56 and serum bicarbonate elevated at 33.  Creatinine was elevated to 1.58 Chest x-ray showed multifocal bilateral interstitial and ground-glass opacities, which may reflect atypical infection or edema. Patient was admitted to ICU, extubated next day and transfered to hospital service.  Subjective: Patient was seen and examined this morning. Sitting up at the edge of the bed. Remains on 4 L oxygen by nasal cannula. Requires much higher flow on even walking to the bathroom. Continues to have pedal edema 1+ bilaterally.  Assessment/Plan: Acute exacerbation of chronic diastolic CHF -Presented with progressively worsening shortness of breath leading to unresponsiveness. -Chest x-ray on admission read as multifocal bilateral interstitial groundglass opacities. I think it was more because of pulmonary edema and then infectious infiltrates. -Patient was given a dose of IV Lasix after which the chest x-ray next morning was better. -Patient has 1+ bilateral pedal edema,  worsening BNP and continues to require supplemental oxygen. -I will resume IV Lasix 40 mg daily from this morning. -Net IO Since Admission: 895.72 mL [09/15/20 1125] -Continue to monitor for daily intake output, weight, blood pressure, BNP, renal function and electrolytes. Recent Labs  Lab 09/12/20 2230 09/12/20 2240 09/12/20 2256 09/13/20 0039 09/13/20 0408 09/14/20 0100 09/15/20 0236  BNP  --   --   --  605.9*  --  925.1* 1,221.6*  BUN 7 8  --   --  11 20 27*  CREATININE 1.58* 1.60*  --  1.77* 1.99* 1.55* 1.17  K 3.9 3.8 3.8  --  3.8 4.4 4.6  MG  --   --   --   --  2.3  --   --    Pneumonia ruled out -On admission, chest x-ray findings were interpreted as multifocal pneumonia.  -However he had normal procalcitonin, normal WBC, no fever. I do not think patient had pneumonia and the findings are mostly because of congestive heart failure. I stopped antibiotics this morning. -Patient was also started on IV steroids, I would quickly taper them to stop soon. Recent Labs  Lab 09/12/20 2230 09/13/20 0039 09/13/20 0408 09/14/20 0100 09/15/20 0236  WBC 6.2 13.9* 10.1 13.4* 13.3*  PROCALCITON  --   --   --  <0.10 <0.10   Acute on chronic respiratory failure with hypercapnia Obstructive sleep apnea -At home, patient was using 2 L oxygen during the day and oxygen through CPAP at night. -Emergently intubated in the ED, admitted to ICU. Extubated next day and transfered to hospitalist service. -Currently on 3 L oxygen by nasal cannula.  Wean down as tolerated.  Essential hypertension -Home meds include Coreg 12.5 mg twice daily, amlodipine 5 mg daily, Lasix 40 mg twice daily, Imdur 30 mg daily, losartan 100 mg daily -Currently on Coreg 12.5 mg twice daily, Lasix 40 mg IV daily, Imdur 30 mg daily. -Continue to monitor blood pressure. Patient will need further adjustment of blood pressure medicines.  Paroxysmal A. Fib -EKG on admission showed sinus rhythm. -On Coreg and Eliquis.  Continue the same.  AKI on CKD 3a -Creatinine was elevated 1.58 on presentation.  Peaked at 1.99 on 12/23. Improved down to normal now. Continue to monitor with Lasix. Recent Labs    04/16/20 0703 06/17/20 1157 06/20/20 1500 06/22/20 0835 07/05/20 0853 09/12/20 2230 09/12/20 2240 09/13/20 0039 09/13/20 0408 09/14/20 0100 09/15/20 0236  BUN 23* 9 13 24* 9 7 8   --  11 20 27*  CREATININE 1.38* 1.35* 1.36* 1.34* 1.10 1.58* 1.60* 1.77* 1.99* 1.55* 1.17   Acute metabolic encephalopathy -Altered mental status at presentation probably secondary to hypercapnia.  Also multiple psych medicines at home. Currently on hold. -CT head negative. -Mental status improved back to normal  Type 2 diabetes mellitus -A1c 6.3 on 12/23. -On Metformin 500 mg twice daily at home.  Currently on hold.   -Blood sugar running elevated because of steroids. Expect to improve once steroid is tapered down.  -Continue sliding scale insulin with Accu-Cheks. Recent Labs  Lab 09/14/20 0749 09/14/20 1124 09/14/20 1711 09/14/20 2048 09/15/20 0744  GLUCAP 162* 148* 196* 82 168*   Hyperlipidemia -Continue Crestor.  Anxiety/depression -Home meds include Abilify 5 mg daily, BuSpar 15 mg twice daily, Prozac 60 mg daily, Neurontin 300 mg 3 times daily, trazodone 25 to 50 mg at bedtime as needed. -Resume gradually  Mobility: Encourage ambulation as tolerated. Code Status:   Code Status: Full Code  Nutritional status: Body mass index is 27.66 kg/m.     Diet Order            Diet heart healthy/carb modified Room service appropriate? Yes; Fluid consistency: Thin  Diet effective now                 DVT prophylaxis: SCDs Start: 09/13/20 0024 apixaban (ELIQUIS) tablet 5 mg   Antimicrobials:  Stopped today Fluid: None Consultants: Critical care Family Communication:  None at bedside  Status is: Inpatient   Remains inpatient appropriate because: On IV antibiotics, medicines being titrated  Dispo: The  patient is from: Home              Anticipated d/c is to: Home              Anticipated d/c date is: 2 to 3 days              Patient currently is not medically stable to d/c.  Infusions:    Scheduled Meds: . albuterol  2.5 mg Nebulization QID  . apixaban  5 mg Oral BID  . carvedilol  12.5 mg Oral BID WC  . Chlorhexidine Gluconate Cloth  6 each Topical Daily  . docusate  100 mg Oral BID  . furosemide  40 mg Intravenous Daily  . guaiFENesin  600 mg Oral BID  . insulin aspart  0-15 Units Subcutaneous TID AC & HS  . isosorbide mononitrate  30 mg Oral Daily  . mouth rinse  15 mL Mouth Rinse BID  . [START ON 09/16/2020] methylPREDNISolone (SOLU-MEDROL) injection  60 mg Intravenous Daily  . pantoprazole (PROTONIX) IV  40 mg Intravenous QHS  Antimicrobials: Anti-infectives (From admission, onward)   Start     Dose/Rate Route Frequency Ordered Stop   09/14/20 0000  azithromycin (ZITHROMAX) 500 mg in sodium chloride 0.9 % 250 mL IVPB  Status:  Discontinued        500 mg 250 mL/hr over 60 Minutes Intravenous Every 24 hours 09/13/20 0029 09/15/20 0753   09/13/20 2200  cefTRIAXone (ROCEPHIN) 1 g in sodium chloride 0.9 % 100 mL IVPB  Status:  Discontinued        1 g 200 mL/hr over 30 Minutes Intravenous Every 24 hours 09/13/20 0029 09/15/20 0753   09/13/20 0115  azithromycin (ZITHROMAX) 500 mg in sodium chloride 0.9 % 250 mL IVPB        500 mg 250 mL/hr over 60 Minutes Intravenous  Once 09/13/20 0033 09/13/20 0338   09/13/20 0045  cefTRIAXone (ROCEPHIN) 1 g in sodium chloride 0.9 % 100 mL IVPB        1 g 200 mL/hr over 30 Minutes Intravenous  Once 09/13/20 0033 09/13/20 0127      PRN meds: acetaminophen, albuterol, polyethylene glycol   Objective: Vitals:   09/15/20 0758 09/15/20 0816  BP:  (!) 178/108  Pulse: 79 81  Resp: 18 18  Temp:  (!) 97.4 F (36.3 C)  SpO2: 100% 100%    Intake/Output Summary (Last 24 hours) at 09/15/2020 1123 Last data filed at 09/14/2020  1707 Gross per 24 hour  Intake 871.7 ml  Output --  Net 871.7 ml   Filed Weights   09/12/20 2250 09/13/20 0330  Weight: 91 kg 92.5 kg   Weight change:  Body mass index is 27.66 kg/m.   Physical Exam: General exam: Pleasant, middle-aged African-American male. Not in distress at rest but significant respiratory distress on minimal ambulation Skin: No rashes, lesions or ulcers. HEENT: Atraumatic, normocephalic, no obvious bleeding Lungs: Clear to auscultation bilaterally CVS: Regular rate and rhythm, no murmur GI/Abd soft, nontender, nondistended, bowel sound present CNS: Alert, awake, oriented x3 Psychiatry: Mood appropriate Extremities: 1+ bilateral pedal edema, no calf tenderness  Data Review: I have personally reviewed the laboratory data and studies available.  Recent Labs  Lab 09/12/20 2230 09/12/20 2240 09/12/20 2256 09/13/20 0039 09/13/20 0408 09/14/20 0100 09/15/20 0236  WBC 6.2  --   --  13.9* 10.1 13.4* 13.3*  NEUTROABS 3.0  --   --   --   --   --  12.9*  HGB 12.0*   < > 11.9* 11.0* 10.6* 10.5* 10.8*  HCT 40.3   < > 35.0* 36.3* 34.5* 34.7* 36.2*  MCV 93.9  --   --  93.1 92.0 91.6 92.3  PLT 271  --   --  260 226 207 222   < > = values in this interval not displayed.   Recent Labs  Lab 09/12/20 2230 09/12/20 2240 09/12/20 2256 09/13/20 0039 09/13/20 0408 09/14/20 0100 09/15/20 0236  NA 137 139 137  --  137 138 135  K 3.9 3.8 3.8  --  3.8 4.4 4.6  CL 99 97*  --   --  98 98 96*  CO2 27  --   --   --  26 31 29   GLUCOSE 189* 184*  --   --  259* 86 211*  BUN 7 8  --   --  11 20 27*  CREATININE 1.58* 1.60*  --  1.77* 1.99* 1.55* 1.17  CALCIUM 8.5*  --   --   --  8.3* 8.8*  8.9  MG  --   --   --   --  2.3  --   --   PHOS  --   --   --   --  1.2*  --   --     F/u labs ordered  Signed, Lorin Glass, MD [facility] 09/15/2020

## 2020-09-15 NOTE — Progress Notes (Signed)
RT at bedside to give breathing treatment. Pt was found to on 3LNC tolerating well with SVS. Pt stated he would like to go on CPAP later on today. RT will continue to monitor pt.

## 2020-09-15 NOTE — Plan of Care (Signed)
  Problem: Respiratory: Goal: Ability to maintain a clear airway will improve Outcome: Progressing Goal: Levels of oxygenation will improve Outcome: Progressing   Problem: Activity: Goal: Ability to tolerate increased activity will improve Outcome: Progressing Goal: Will verbalize the importance of balancing activity with adequate rest periods Outcome: Progressing

## 2020-09-15 NOTE — Progress Notes (Signed)
RT placed pt on CPAP. Pt tolerated well.

## 2020-09-16 DIAGNOSIS — J9602 Acute respiratory failure with hypercapnia: Secondary | ICD-10-CM | POA: Diagnosis not present

## 2020-09-16 DIAGNOSIS — J9601 Acute respiratory failure with hypoxia: Secondary | ICD-10-CM | POA: Diagnosis not present

## 2020-09-16 LAB — PHOSPHORUS: Phosphorus: 3.4 mg/dL (ref 2.5–4.6)

## 2020-09-16 LAB — MAGNESIUM: Magnesium: 2.3 mg/dL (ref 1.7–2.4)

## 2020-09-16 LAB — BASIC METABOLIC PANEL
Anion gap: 10 (ref 5–15)
BUN: 27 mg/dL — ABNORMAL HIGH (ref 6–20)
CO2: 32 mmol/L (ref 22–32)
Calcium: 9.1 mg/dL (ref 8.9–10.3)
Chloride: 98 mmol/L (ref 98–111)
Creatinine, Ser: 1.33 mg/dL — ABNORMAL HIGH (ref 0.61–1.24)
GFR, Estimated: >60 mL/min (ref >60)
Glucose, Bld: 91 mg/dL (ref 70–99)
Potassium: 3.7 mmol/L (ref 3.5–5.1)
Sodium: 140 mmol/L (ref 135–145)

## 2020-09-16 LAB — GLUCOSE, CAPILLARY: Glucose-Capillary: 168 mg/dL — ABNORMAL HIGH (ref 70–99)

## 2020-09-16 LAB — BRAIN NATRIURETIC PEPTIDE: B Natriuretic Peptide: 1514.3 pg/mL — ABNORMAL HIGH (ref 0.0–100.0)

## 2020-09-16 NOTE — Progress Notes (Signed)
Pt walked in hallway (masked) with portable O2 tank @3L  from his room, to the nurses station then back to his room.  Pt completed walk unassisted with RN present for support if needed.  Pt complained that he was SOB and sat on the side of his bed and was able to  recover quickly.  Will continue to monitor for safety.

## 2020-09-16 NOTE — Discharge Summary (Signed)
Physician Discharge Summary  Kenneth [name] FOY:[id number] DOB: Jul 12, 1961 DOA: 09/12/2020  PCP: Kenneth Maw, MD  Admit date: 09/12/2020 Discharge date: 09/16/2020  Admitted From: Home Discharge disposition: Home   Code Status: Full Code  Diet Recommendation: Cardiac/diabetic diet  Discharge Diagnosis:   Active Problems:   Acute respiratory failure ([place])  Brief narrative: Kenneth [name] is a 59 y.o. male with PMH significant for T2DM, HTN, HLD, systolic CHF, proximal A. fib, COPD, with 2 L oxygen during the day and OSA with nightly CPAP, anxiety, depression. Patient was brought to the ED from home on 12/22 for difficulty breathing and decreased responsiveness.  For last few weeks, patient was having more pronounced COPD symptoms.  Most recently on 09/05/2020, patient was placed on a course of prednisone.  Symptoms persisted despite compliance to medications and more than normal use of DuoNeb. EMS brought him to ED on a CPAP. By the time of arrival to the ED, patient was unresponsive and required emergent intubation. Blood gas obtained after intubation showed normal pH with PCO2 elevated to 56 and serum bicarbonate elevated at 33.  Creatinine was elevated to 1.58 Chest x-ray showed multifocal bilateral interstitial and ground-glass opacities, which may reflect atypical infection or edema. Patient was admitted to ICU, extubated next day and transfered to hospital service.  Subjective: Patient was seen and examined this morning. Sitting up at the edge of the bed.  Feels better.  On 3 L oxygen by nasal cannula.  Was able to ambulate without assistance on the same flow. Feels ready to go home.  Assessment/Plan: Acute exacerbation of chronic diastolic CHF -Presented with progressively worsening shortness of breath leading to unresponsiveness. -Chest x-ray on admission read as multifocal bilateral interstitial groundglass opacities. I think it was more because of pulmonary  edema and then infectious infiltrates. -Echo 12/23 with EF 60 to 65%, moderate concentric LVH, grade 1 diastolic dysfunction, mildly elevated pulmonary artery systolic pressure. -Patient was given  IV Lasix after which subsequent chest x-ray showed improvement. -Diuresed adequately.  Feels better.  We will discharge him home to resume Lasix 40 mg twice daily as prior.  Pneumonia ruled out -On admission, chest x-ray findings were interpreted as multifocal pneumonia.  -However he had normal procalcitonin, normal WBC, no fever. I do not think patient had pneumonia and the findings are mostly because of congestive heart failure. I stopped antibiotics -Patient was also started on IV steroids, tapered off.  No prednisone at discharge. Recent Labs  Lab 09/12/20 2230 09/13/20 0039 09/13/20 0408 09/14/20 0100 09/15/20 0236  WBC 6.2 13.9* 10.1 13.4* 13.3*  PROCALCITON  --   --   --  <0.10 <0.10   Acute on chronic respiratory failure with hypercapnia Obstructive sleep apnea -At home, patient was using 2 L oxygen during the day and oxygen through CPAP at night. -Emergently intubated in the ED, admitted to ICU. Extubated next day and transfered to hospitalist service. -Currently on 3 L oxygen by nasal cannula which is his home requirement.   Essential hypertension -Home meds include Coreg 12.5 mg twice daily, amlodipine 5 mg daily, Lasix 40 mg twice daily, Imdur 30 mg daily, losartan 100 mg daily -Currently on Coreg 12.5 mg twice daily, Lasix 40 mg IV daily, Imdur 30 mg daily. -Post discharge, patient will continue Coreg 12.5 mg twice daily, Lasix 40 mg twice daily, Imdur 30 mg daily.  Amlodipine and losartan are on hold.  I have instructed the patient and his significant other to monitor blood pressure  at home and initiate them gradually if systolic blood pressure is more than 140.  Paroxysmal A. Fib -EKG on admission showed sinus rhythm. -On Coreg and Eliquis. Continue the same.  AKI on CKD  3a -Creatinine was elevated 1.58 on presentation.  Peaked at 1.99 on 12/23. Improving. -Repeat BMP in a week with PCP. Recent Labs    06/17/20 1157 06/20/20 1500 06/22/20 0835 07/05/20 0853 09/12/20 2230 09/12/20 2240 09/13/20 0039 09/13/20 0408 09/14/20 0100 09/15/20 0236 09/16/20 0154  BUN 9 13 24* _0 --  11 20 27* 27*  CREATININE 1.35* 1.36* 1.34* 1.10 1.58* 1.60* 1.77* 1.99* 1.55* 1.17 8.50*   Acute metabolic encephalopathy -Altered mental status at presentation probably secondary to hypercapnia.  Also multiple psych medicines at home. -CT head negative. -Mental status improved back to normal  Type 2 diabetes mellitus -A1c 6.3 on 12/23. -On Metformin 500 mg twice daily at home.  Currently on hold.  I would keep it on hold he has repeat BMP done by PCP in a week. -Blood sugar level elevated in the hospital because of IV steroids.  He will no longer be on steroids Recent Labs  Lab 09/15/20 0744 09/15/20 1139 09/15/20 1748 09/15/20 2116 09/16/20 0749  GLUCAP 168* 96 170* 186* 168*   Hyperlipidemia -Continue Crestor.  Anxiety/depression -Home meds include Abilify 5 mg daily, BuSpar 15 mg twice daily, Prozac 60 mg daily, Neurontin 300 mg 3 times daily, trazodone 25 to 50 mg at bedtime as needed. -I have instructed him to minimize the use of multiple mood altering medications at home.  He will work with PCP as an outpatient for that.   Wound care:    Discharge Exam:   Vitals:   09/15/20 2300 09/15/20 2301 09/16/20 0452 09/16/20 0916  BP:   (!) 157/96 (!) 133/93  Pulse: 81 81 82 80  Resp: _1 Temp:   97.8 F (36.6 C) 98.1 F (36.7 C)  TempSrc:   Oral Oral  SpO2: 100% 100% 100% 100%  Weight:      Height:        Body mass index is 27.66 kg/m.  General exam: Pleasant, middle-aged African-American male.  Looks older for his age. Skin: No rashes, lesions or ulcers. HEENT: Atraumatic, normocephalic, no obvious bleeding Lungs: Clear to  auscultation bilaterally CVS: Regular rate and rhythm, no murmur GI/Abd soft, nontender, nondistended, bowel sound present CNS: Alert, awake, oriented x3 Psychiatry: Mood appropriate Extremities: Pedal edema trace bilateral, improving  Follow ups:   Discharge Instructions    Diet - low sodium heart healthy   Complete by: As directed    Diet Carb Modified   Complete by: As directed    Increase activity slowly   Complete by: As directed       Follow-up Information    Kenneth Maw, MD Follow up.   Specialty: Family Medicine Contact information: [place] [address] [phone]        Buford Dresser, MD .   Specialty: Cardiology Contact information: [address] [place] [address] [phone]               Recommendations for Outpatient Follow-Up:   1. Follow-up with PCP as an outpatient in a week for repeat BMP  Discharge Instructions:  Follow with Primary MD Kenneth Maw, MD in 7 days   Get CBC/BMP checked in next visit within 1 week by PCP or SNF MD ( we  routinely change or add medications that can affect your baseline labs and fluid status, therefore we recommend that you get the mentioned basic workup next visit with your PCP, your PCP may decide not to get them or add new tests based on their clinical decision)  On your next visit with your PCP, please Get Medicines reviewed and adjusted.  Please request your PCP  to go over all Hospital Tests and Procedure/Radiological results at the follow up, please get all Hospital records sent to your Prim MD by signing hospital release before you go home.  Activity: As tolerated with Full fall precautions use walker/cane & assistance as needed  For Heart failure patients - Check your Weight same time everyday, if you gain over 2 pounds, or you develop in leg swelling, experience more shortness of breath or chest pain, call your Primary MD  immediately. Follow Cardiac Low Salt Diet and 1.5 lit/day fluid restriction.  If you have smoked or chewed Tobacco in the last 2 yrs please stop smoking, stop any regular Alcohol  and or any Recreational drug use.  If you experience worsening of your admission symptoms, develop shortness of breath, life threatening emergency, suicidal or homicidal thoughts you must seek medical attention immediately by calling 911 or calling your MD immediately  if symptoms less severe.  You Must read complete instructions/literature along with all the possible adverse reactions/side effects for all the Medicines you take and that have been prescribed to you. Take any new Medicines after you have completely understood and accpet all the possible adverse reactions/side effects.   Do not drive, operate heavy machinery, perform activities at heights, swimming or participation in water activities or provide baby sitting services if your were admitted for syncope or siezures until you have seen by Primary MD or a Neurologist and advised to do so again.  Do not drive when taking Pain medications.  Do not take more than prescribed Pain, Sleep and Anxiety Medications  Wear Seat belts while driving.   Please note You were cared for by a hospitalist during your hospital stay. If you have any questions about your discharge medications or the care you received while you were in the hospital after you are discharged, you can call the unit and asked to speak with the hospitalist on call if the hospitalist that took care of you is not available. Once you are discharged, your primary care physician will handle any further medical issues. Please note that NO REFILLS for any discharge medications will be authorized once you are discharged, as it is imperative that you return to your primary care physician (or establish a relationship with a primary care physician if you do not have one) for your aftercare needs so that they can  reassess your need for medications and monitor your lab values.    Allergies as of 09/16/2020      Reactions   Lisinopril Swelling   Other Other (See Comments)   Lettuce : rash   Tomato Rash      Medication List    STOP taking these medications   amLODipine 5 MG tablet Commonly known as: NORVASC   losartan 100 MG tablet Commonly known as: COZAAR   metFORMIN 500 MG tablet Commonly known as: GLUCOPHAGE     TAKE these medications   acetaminophen 325 MG tablet Commonly known as: TYLENOL Take 650 mg by mouth every 6 (six) hours as needed for mild pain or headache.   albuterol 108 (90 Base) MCG/ACT inhaler Commonly  known as: VENTOLIN HFA Inhale 1-2 puffs into the lungs every 4 (four) hours as needed for wheezing or shortness of breath. What changed: Another medication with the same name was changed. Make sure you understand how and when to take each.   albuterol (2.5 MG/3ML) 0.083% nebulizer solution Commonly known as: PROVENTIL INHALE 1 VIAL VIA NEBULIZER 5 TIMES DAILY AS NEEDED FOR WHEEZING OR FOR SHORTNESS OF BREATH What changed: See the new instructions.   apixaban 5 MG Tabs tablet Commonly known as: Eliquis Take 1 tablet (5 mg total) by mouth 2 (two) times daily.   ARIPiprazole 5 MG tablet Commonly known as: ABILIFY Take 5 mg by mouth daily.   busPIRone 15 MG tablet Commonly known as: BUSPAR Take 1 tablet (15 mg total) by mouth 2 (two) times daily.   carvedilol 12.5 MG tablet Commonly known as: COREG Take 1 tablet (12.5 mg total) by mouth 2 (two) times daily with a meal.   Coricidin HBP Day/Night Cold 10-20 &15-200-2 MG Misc Generic drug: DM-GG & DM-APAP-CPM Take 1 tablet by mouth every 12 (twelve) hours as needed (cold symptoms).   famotidine 20 MG tablet Commonly known as: PEPCID Take 20 mg by mouth daily.   FLUoxetine 20 MG capsule Commonly known as: PROZAC TAKE 3 CAPSULES BY MOUTH  DAILY   furosemide 40 MG tablet Commonly known as: LASIX Take 1  tablet (40 mg total) by mouth 2 (two) times daily.   gabapentin 300 MG capsule Commonly known as: NEURONTIN TAKE ONE CAPSULE BY MOUTH THREE TIMES DAILY   ipratropium 0.02 % nebulizer solution Commonly known as: ATROVENT Take 2.5 mLs (0.5 mg total) by nebulization 4 (four) times daily.   ipratropium-albuterol 0.5-2.5 (3) MG/3ML Soln Commonly known as: DUONEB Take 3 mLs by nebulization every 6 (six) hours as needed. What changed: reasons to take this   isosorbide mononitrate 30 MG 24 hr tablet Commonly known as: IMDUR Take 30 mg by mouth daily.   lubiprostone 8 MCG capsule Commonly known as: [name] TAKE 1 CAPSULE(8 MCG) BY MOUTH TWICE DAILY WITH A MEAL What changed:   how much to take  how to take this  when to take this  additional instructions   multivitamin with minerals Tabs tablet Take 1 tablet by mouth daily.   nitroGLYCERIN 0.4 MG SL tablet Commonly known as: NITROSTAT DISSOLVE 1 TABLET UNDER THE TONGUE EVERY 5 MINUTES AS  NEEDED FOR CHEST PAIN. MAX  OF 3 TABLETS IN 15 MINUTES. CALL 911 IF PAIN PERSISTS. What changed:   how much to take  how to take this  when to take this  reasons to take this  additional instructions   ONE TOUCH ULTRA 2 w/Device Kit Use to test blood sugars 1-2 times daily.   OneTouch Ultra test strip Generic drug: glucose blood USE TO TEST BLOOD SUGAR 1-2 TIMES DAILY   onetouch ultrasoft lancets USE TO TEST BLOOD SUGAR 1-2 TIMES DAILY   pantoprazole 40 MG tablet Commonly known as: PROTONIX TAKE ONE TABLET BY MOUTH EVERY MORNING What changed: when to take this   rosuvastatin 20 MG tablet Commonly known as: Crestor Take 1 tablet (20 mg total) by mouth daily.   traZODone 50 MG tablet Commonly known as: DESYREL TAKE 1/2 TO 1 TABLET BY  MOUTH AT BEDTIME AS NEEDED  FOR SLEEP. What changed:   reasons to take this  additional instructions   Trelegy Ellipta 100-62.5-25 MCG/INH Aepb Generic drug:  Fluticasone-Umeclidin-Vilant Inhale 1 puff into the lungs daily.  Wixela Inhub 250-50 MCG/DOSE Aepb Generic drug: Fluticasone-Salmeterol Inhale 1 puff into the lungs 2 (two) times daily.       Time coordinating discharge: 35 minutes  The results of significant diagnostics from this hospitalization (including imaging, microbiology, ancillary and laboratory) are listed below for reference.    Procedures and Diagnostic Studies:   CT HEAD WO CONTRAST  Result Date: 09/13/2020 CLINICAL DATA:  Shortness of breath. EXAM: CT HEAD WITHOUT CONTRAST TECHNIQUE: Contiguous axial images were obtained from the base of the skull through the vertex without intravenous contrast. COMPARISON:  None. FINDINGS: Brain: No evidence of acute infarction, hemorrhage, hydrocephalus, extra-axial collection or mass lesion/mass effect. Vascular: No hyperdense vessel or unexpected calcification. Skull: Normal. Negative for fracture or focal lesion. Sinuses/Orbits: There is mild bilateral ethmoid sinus mucosal thickening. Other: None. IMPRESSION: 1. No acute intracranial abnormality. Electronically Signed   By: Virgina Norfolk M.D.   On: 09/13/2020 01:16   DG Chest Port 1 View  Result Date: 09/12/2020 CLINICAL DATA:  Intubated EXAM: PORTABLE CHEST 1 VIEW COMPARISON:  06/20/2020 FINDINGS: Single frontal view of the chest demonstrates endotracheal tube overlying tracheal air column tip midway between thoracic inlet and carina. Enteric catheter passes below diaphragm, tip excluded by collimation. Cardiac silhouette is unremarkable. There is bilateral interstitial prominence with multifocal bilateral ground-glass airspace disease. No effusion or pneumothorax. IMPRESSION: 1. No complication after intubation. 2. Multifocal bilateral interstitial and ground-glass opacities, which may reflect atypical infection or edema. The Electronically Signed   By: Randa Ngo M.D.   On: 09/12/2020 22:39   DG Abd Portable 1 View  Result  Date: 09/12/2020 CLINICAL DATA:  Enteric catheter placement EXAM: PORTABLE ABDOMEN - 1 VIEW COMPARISON:  None. FINDINGS: Supine frontal view of the abdomen and pelvis excludes the right flank and pubic symphysis by collimation. Enteric catheter coiled over the gastric fundus. Bowel gas pattern is unremarkable. IMPRESSION: 1. Enteric catheter overlying gastric fundus. Electronically Signed   By: Randa Ngo M.D.   On: 09/12/2020 22:39   ECHOCARDIOGRAM COMPLETE  Result Date: 09/13/2020    ECHOCARDIOGRAM REPORT   Patient Name:   Kenneth [name] Date of Exam: 09/13/2020 Medical Rec #:  [id number]    Height:       72.0 in Accession #:    [id number]   Weight:       203.9 lb Date of Birth:  [birth year]   BSA:          2.148 m Patient Age:    33 years     BP:           107/55 mmHg Patient Gender: M            HR:           63 bpm. Exam Location:  Inpatient Procedure: 2D Echo, Cardiac Doppler and Color Doppler Indications:    Respiratory distress  History:        Patient has prior history of Echocardiogram examinations, most                 recent 04/07/2020. CHF, COPD, Arrythmias:Atrial Fibrillation;                 Risk Factors:Current Smoker, Diabetes, Dyslipidemia and                 Hypertension.  Sonographer:    Dustin Flock Referring Phys: [place]  1. Left ventricular ejection fraction, by estimation, is 60 to 65%. The left ventricle has normal  function. The left ventricle has no regional wall motion abnormalities. There is moderate concentric left ventricular hypertrophy. Left ventricular diastolic parameters are consistent with Grade I diastolic dysfunction (impaired relaxation).  2. Right ventricular systolic function is normal. The right ventricular size is normal. There is mildly elevated pulmonary artery systolic pressure.  3. The mitral valve is normal in structure. No evidence of mitral valve regurgitation. No evidence of mitral stenosis.  4. The aortic valve is  tricuspid. Aortic valve regurgitation is mild to moderate. Mild aortic valve stenosis. Aortic regurgitation PHT measures 434 msec. Aortic valve area, by VTI measures 1.38 cm. Aortic valve mean gradient measures 12.0 mmHg. Aortic valve Vmax measures 2.50 m/s.  5. The inferior vena cava is dilated in size with <50% respiratory variability, suggesting right atrial pressure of 15 mmHg. FINDINGS  Left Ventricle: Left ventricular ejection fraction, by estimation, is 60 to 65%. The left ventricle has normal function. The left ventricle has no regional wall motion abnormalities. The left ventricular internal cavity size was normal in size. There is  moderate concentric left ventricular hypertrophy. Left ventricular diastolic parameters are consistent with Grade I diastolic dysfunction (impaired relaxation). Indeterminate filling pressures. Right Ventricle: The right ventricular size is normal. No increase in right ventricular wall thickness. Right ventricular systolic function is normal. There is mildly elevated pulmonary artery systolic pressure. The tricuspid regurgitant velocity is 2.57  m/s, and with an assumed right atrial pressure of 15 mmHg, the estimated right ventricular systolic pressure is 01.0 mmHg. Left Atrium: Left atrial size was normal in size. Right Atrium: Right atrial size was normal in size. Pericardium: There is no evidence of pericardial effusion. Mitral Valve: The mitral valve is normal in structure. No evidence of mitral valve regurgitation. No evidence of mitral valve stenosis. Tricuspid Valve: The tricuspid valve is normal in structure. Tricuspid valve regurgitation is trivial. No evidence of tricuspid stenosis. Aortic Valve: The aortic valve is tricuspid. Aortic valve regurgitation is mild to moderate. Aortic regurgitation PHT measures 434 msec. Mild aortic stenosis is present. Aortic valve mean gradient measures 12.0 mmHg. Aortic valve peak gradient measures 25.0 mmHg. Aortic valve area, by VTI  measures 1.38 cm. Pulmonic Valve: The pulmonic valve was normal in structure. Pulmonic valve regurgitation is not visualized. No evidence of pulmonic stenosis. Aorta: The aortic root is normal in size and structure. Venous: The inferior vena cava is dilated in size with less than 50% respiratory variability, suggesting right atrial pressure of 15 mmHg. IAS/Shunts: No atrial level shunt detected by color flow Doppler.  LEFT VENTRICLE PLAX 2D LVIDd:         5.10 cm  Diastology LVIDs:         3.30 cm  LV e' medial:    5.77 cm/s LV PW:         1.40 cm  LV E/e' medial:  11.4 LV IVS:        1.30 cm  LV e' lateral:   6.20 cm/s LVOT diam:     2.20 cm  LV E/e' lateral: 10.6 LV SV:         70 LV SV Index:   33 LVOT Area:     3.80 cm  RIGHT VENTRICLE RV Basal diam:  2.90 cm RV S prime:     9.14 cm/s TAPSE (M-mode): 2.0 cm LEFT ATRIUM             Index       RIGHT ATRIUM  Index LA diam:        3.40 cm 1.58 cm/m  RA Area:     14.40 cm LA Vol (A2C):   47.5 ml 22.11 ml/m RA Volume:   38.70 ml  18.01 ml/m LA Vol (A4C):   39.8 ml 18.53 ml/m LA Biplane Vol: 45.8 ml 21.32 ml/m  AORTIC VALVE AV Area (Vmax):    1.37 cm AV Area (Vmean):   1.52 cm AV Area (VTI):     1.38 cm AV Vmax:           250.00 cm/s AV Vmean:          161.000 cm/s AV VTI:            0.510 m AV Peak Grad:      25.0 mmHg AV Mean Grad:      12.0 mmHg LVOT Vmax:         90.10 cm/s LVOT Vmean:        64.500 cm/s LVOT VTI:          0.185 m LVOT/AV VTI ratio: 0.36 AI PHT:            434 msec  AORTA Ao Root diam: 3.20 cm MITRAL VALVE               TRICUSPID VALVE MV Area (PHT): 3.12 cm    TR Peak grad:   26.4 mmHg MV Decel Time: 243 msec    TR Vmax:        257.00 cm/s MV E velocity: 65.60 cm/s MV A velocity: 62.60 cm/s  SHUNTS MV E/A ratio:  1.05        Systemic VTI:  0.18 m                            Systemic Diam: 2.20 cm Skeet Latch MD Electronically signed by Skeet Latch MD Signature Date/Time: 09/13/2020/12:05:59 PM    Final      Labs:    Basic Metabolic Panel: Recent Labs  Lab 09/12/20 2230 09/12/20 2240 09/12/20 2256 09/13/20 0039 09/13/20 0408 09/14/20 0100 09/15/20 0236 09/16/20 0154  NA 137 139 137  --  137 138 135 140  K 3.9 3.8 3.8  --  3.8 4.4 4.6 3.7  CL 99 97*  --   --  98 98 96* 98  CO2 27  --   --   --  _0 32  GLUCOSE 189* 184*  --   --  259* 86 211* 91  BUN 7 8  --   --  11 20 27* 27*  CREATININE 1.58* 1.60*  --  1.77* 1.99* 1.55* 1.17 1.33*  CALCIUM 8.5*  --   --   --  8.3* 8.8* 8.9 9.1  MG  --   --   --   --  2.3  --   --  2.3  PHOS  --   --   --   --  1.2*  --   --  3.4   GFR Estimated Creatinine Clearance: 65.6 mL/min (A) (by C-G formula based on SCr of 1.33 mg/dL (H)). Liver Function Tests: Recent Labs  Lab 09/12/20 2230  AST 26  ALT 18  ALKPHOS 60  BILITOT 0.7  PROT 6.3*  ALBUMIN 3.6   No results for input(s): LIPASE, AMYLASE in the last 168 hours. No results for input(s): AMMONIA in the last 168 hours. Coagulation profile No results for input(s): INR, PROTIME  in the last 168 hours.  CBC: Recent Labs  Lab 09/12/20 2230 09/12/20 2240 09/12/20 2256 09/13/20 0039 09/13/20 0408 09/14/20 0100 09/15/20 0236  WBC 6.2  --   --  13.9* 10.1 13.4* 13.3*  NEUTROABS 3.0  --   --   --   --   --  12.9*  HGB 12.0*   < > 11.9* 11.0* 10.6* 10.5* 10.8*  HCT 40.3   < > 35.0* 36.3* 34.5* 34.7* 36.2*  MCV 93.9  --   --  93.1 92.0 91.6 92.3  PLT 271  --   --  260 226 207 222   < > = values in this interval not displayed.   Cardiac Enzymes: No results for input(s): CKTOTAL, CKMB, CKMBINDEX, TROPONINI in the last 168 hours. BNP: Invalid input(s): POCBNP CBG: Recent Labs  Lab 09/15/20 0744 09/15/20 1139 09/15/20 1748 09/15/20 2116 09/16/20 0749  GLUCAP 168* 96 170* 186* 168*   D-Dimer No results for input(s): DDIMER in the last 72 hours. Hgb A1c No results for input(s): HGBA1C in the last 72 hours. Lipid Profile Recent Labs    09/14/20 0100 09/15/20 0236  TRIG 66 193*    Thyroid function studies No results for input(s): TSH, T4TOTAL, T3FREE, THYROIDAB in the last 72 hours.  Invalid input(s): FREET3 Anemia work up No results for input(s): VITAMINB12, FOLATE, FERRITIN, TIBC, IRON, RETICCTPCT in the last 72 hours. Microbiology Recent Results (from the past 240 hour(s))  Resp Panel by RT-PCR (Flu A&B, Covid) Nasopharyngeal Swab     Status: None   Collection Time: 09/12/20 10:30 PM   Specimen: Nasopharyngeal Swab; Nasopharyngeal(NP) swabs in vial transport medium  Result Value Ref Range Status   SARS Coronavirus 2 by RT PCR NEGATIVE NEGATIVE Final    Comment: (NOTE) SARS-CoV-2 target nucleic acids are NOT DETECTED.  The SARS-CoV-2 RNA is generally detectable in upper respiratory specimens during the acute phase of infection. The lowest concentration of SARS-CoV-2 viral copies this assay can detect is 138 copies/mL. A negative result does not preclude SARS-Cov-2 infection and should not be used as the sole basis for treatment or other patient management decisions. A negative result may occur with  improper specimen collection/handling, submission of specimen other than nasopharyngeal swab, presence of viral mutation(s) within the areas targeted by this assay, and inadequate number of viral copies(<138 copies/mL). A negative result must be combined with clinical observations, patient history, and epidemiological information. The expected result is Negative.  Fact Sheet for Patients:  EntrepreneurPulse.com.au  Fact Sheet for Healthcare Providers:  IncredibleEmployment.be  This test is no t yet approved or cleared by the [place] FDA and  has been authorized for detection and/or diagnosis of SARS-CoV-2 by FDA under an Emergency Use Authorization (EUA). This EUA will remain  in effect (meaning this test can be used) for the duration of the COVID-19 declaration under Section 564(b)(1) of the Act,  21 U.S.C.section 360bbb-3(b)(1), unless the authorization is terminated  or revoked sooner.       Influenza A by PCR NEGATIVE NEGATIVE Final   Influenza B by PCR NEGATIVE NEGATIVE Final    Comment: (NOTE) The Xpert Xpress SARS-CoV-2/FLU/RSV plus assay is intended as an aid in the diagnosis of influenza from Nasopharyngeal swab specimens and should not be used as a sole basis for treatment. Nasal washings and aspirates are unacceptable for Xpert Xpress SARS-CoV-2/FLU/RSV testing.  Fact Sheet for Patients: EntrepreneurPulse.com.au  Fact Sheet for Healthcare Providers: IncredibleEmployment.be  This test is not yet approved or  cleared by the [place] and has been authorized for detection and/or diagnosis of SARS-CoV-2 by FDA under an Emergency Use Authorization (EUA). This EUA will remain in effect (meaning this test can be used) for the duration of the COVID-19 declaration under Section 564(b)(1) of the Act, 21 U.S.C. section 360bbb-3(b)(1), unless the authorization is terminated or revoked.  Performed at [facility], [place] [address] [place], [place] [address]   MRSA PCR Screening     Status: None   Collection Time: 09/13/20  6:14 AM   Specimen: Nasal Mucosa; Nasopharyngeal  Result Value Ref Range Status   MRSA by PCR NEGATIVE NEGATIVE Final    Comment:        The GeneXpert MRSA Assay (FDA approved for NASAL specimens only), is one component of a comprehensive MRSA colonization surveillance program. It is not intended to diagnose MRSA infection nor to guide or monitor treatment for MRSA infections. Performed at [facility], [place] [address], [place], [place] [address]      Signed: Terrilee Croak  [facility] 09/16/2020, 11:16 AM

## 2020-09-17 ENCOUNTER — Telehealth: Payer: Self-pay

## 2020-09-17 ENCOUNTER — Telehealth: Payer: Self-pay | Admitting: Adult Health

## 2020-09-17 NOTE — Telephone Encounter (Signed)
Patient is returning phone call. Patient phone number is [phone]. 

## 2020-09-17 NOTE — Telephone Encounter (Signed)
Transition Care Management Follow-up Telephone Call  Date of discharge and from where: 09/16/20[facility]  How have you been since you were released from the hospital? Doing Well  Any questions or concerns? No  Items Reviewed:  Did the pt receive and understand the discharge instructions provided? Yes   Medications obtained and verified? Yes   Other? Yes   Any new allergies since your discharge? No   Dietary orders reviewed? Yes  Do you have support at home? Yes   Home Care and Equipment/Supplies: Were home health services ordered? no If so, what is the name of the agency? n/a  Has the agency set up a time to come to the patient's home? not applicable Were any new equipment or medical supplies ordered?  No What is the name of the medical supply agency? n/a Were you able to get the supplies/equipment? not applicable Do you have any questions related to the use of the equipment or supplies? N/ A  Functional Questionnaire: (I = Independent and D = Dependent) ADLs: I  Bathing/Dressing- I  Meal Prep- I  Eating- I  Maintaining continence- I  Transferring/Ambulation- I  Managing Meds- I  Follow up appointments reviewed:   PCP Hospital f/u appt confirmed? Yes  Scheduled to see Worthy Rancher on 09/25/20 @ 11:20.  Specialist Hospital f/u appt confirmed? Yes  Scheduled to see Cardiology on 10/12/20 @ 10:15.  Are transportation arrangements needed? No   If their condition worsens, is the pt aware to call PCP or go to the Emergency Dept.? Yes  Was the patient provided with contact information for the PCP's office or ED? Yes  Was to pt encouraged to call back with questions or concerns? Yes

## 2020-09-17 NOTE — Telephone Encounter (Signed)
Records state pt on CPAP  We already ordered POC   LMTCB for the pt

## 2020-09-17 NOTE — Telephone Encounter (Signed)
Called again and still no answer- LMTCB. 

## 2020-09-18 ENCOUNTER — Other Ambulatory Visit: Payer: Self-pay | Admitting: *Deleted

## 2020-09-18 NOTE — Telephone Encounter (Signed)
Pt is needing to know if ordered was sent through [facility] or [facility] . Please advise

## 2020-09-18 NOTE — Patient Instructions (Signed)
Goals Addressed            This Visit's Progress   . THN - Track and Manage Fluids and Swelling-Heart Failure       Timeframe:  Short-Term Goal Priority:  Medium Start Date:     09/18/2020                        Expected End Date:    10/19/2020                   Follow Up Date 10/02/2020   - track weight in diary - use salt in moderation - watch for swelling in feet, ankles and legs every day - weigh myself daily    Why is this important?    It is important to check your weight daily and watch how much salt and liquids you have.   It will help you to manage your heart failure.    Notes:     . THN - Track and Manage My Symptoms   On track    Timeframe:  Short-Term Goal Priority:  Medium Start Date:     09/04/2020                        Expected End Date:    10/05/2020                     - develop a rescue plan - keep follow-up appointments    Why is this important?   Tracking your symptoms and other information about your health helps your doctor plan your care.  Write down the symptoms, the time of day, what you were doing and what medicine you are taking.  You will soon learn how to manage your symptoms.     Notes:   11/10 - reminded of appointments next week  12/14 - Reviewed upcoming appointments with Cardiology and pulmonology    . THN - Track and Manage My Triggers   On track    Timeframe:  Long-Range Goal Priority:  High Start Date:        09/04/2020                     Expected End Date:         11/05/2020                - eliminate smoking in my home - identify and remove indoor air pollutants - listen for public air quality announcements every day    Why is this important?   Triggers are activities or things, like tobacco smoke or cold weather, that make your COPD (chronic obstructive pulmonary disease) flare-up.  Knowing these triggers helps you plan how to stay away from them.  When you cannot remove them, you can learn how to manage them.      Notes:   11/10 - Reviewed use of inhalers and oxygen  12/14 - Reviewed COPD action plan, encouraged to contact provider office with concerns       Heart Failure Action Plan A heart failure action plan helps you understand what to do when you have symptoms of heart failure. Follow the plan that was created by you and your health care provider. Review your plan each time you visit your health care provider. Red zone These signs and symptoms mean you should get medical help right away:  You have trouble breathing when resting.  You have a dry cough that is getting worse.  You have swelling or pain in your legs or abdomen that is getting worse.  You suddenly gain more than 2-3 lb (0.9-1.4 kg) in a day, or more than 5 lb (2.3 kg) in one week. This amount may be more or less depending on your condition.  You have trouble staying awake or you feel confused.  You have chest pain.  You do not have an appetite.  You pass out. If you experience any of these symptoms:  Call your local emergency services (911 in the U.S[place] right away or seek help at the emergency department of the nearest hospital. Yellow zone These signs and symptoms mean your condition may be getting worse and you should make some changes:  You have trouble breathing when you are active or you need to sleep with extra pillows.  You have swelling in your legs or abdomen.  You gain 2-3 lb (0.9-1.4 kg) in one day, or 5 lb (2.3 kg) in one week. This amount may be more or less depending on your condition.  You get tired easily.  You have trouble sleeping.  You have a dry cough. If you experience any of these symptoms:  Contact your health care provider within the next day.  Your health care provider may adjust your medicines. Green zone These signs mean you are doing well and can continue what you are doing:  You do not have shortness of breath.  You have very little swelling or no new swelling.  Your  weight is stable (no gain or loss).  You have a normal activity level.  You do not have chest pain or any other new symptoms. Follow these instructions at home:  Take over-the-counter and prescription medicines only as told by your health care provider.  Weigh yourself daily. Your target weight is __________ lb (__________ kg). ? Call your health care provider if you gain more than __________ lb (__________ kg) in a day, or more than __________ lb (__________ kg) in one week.  Eat a heart-healthy diet. Work with a diet and nutrition specialist (dietitian) to create an eating plan that is best for you.  Keep all follow-up visits as told by your health care provider. This is important. Where to find more information  American Heart Association: www.heart.org Summary  Follow the action plan that was created by you and your health care provider.  Get help right away if you have any symptoms in the Red zone. This information is not intended to replace advice given to you by your health care provider. Make sure you discuss any questions you have with your health care provider. Document Revised: 08/21/2017 Document Reviewed: 10/18/2016 Elsevier Patient Education  2020 [employer].

## 2020-09-18 NOTE — Telephone Encounter (Signed)
Spoke with patient. I advised him that the orders had been placed on 09/13/20. He verbalized understanding. Nothing further needed at time of call.

## 2020-09-18 NOTE — Telephone Encounter (Signed)
Patient is returning phone call. Patient phone number is [phone]. 

## 2020-09-18 NOTE — Patient Outreach (Signed)
[facility]) Care Management  Kenneth [name]  09/18/2020   Kenneth [name] 22-Aug-1961 [id number]   Noted that member was admitted to hospital 12/22-12/26 for acute respiratory failure requiring intubation.  State he was in his usual state, intermittent shortness of breath recovering with rest and use of nebulizer, until the day of admission. He wasn't able to remember all details but report having severe shortness of breath.  Since discharge, report being back to his normal, wearing 2 L of oxygen, saturations 99-100%.  Report proper use of inhalers, including rinsing mouth after use.  Also discussed breathing exercises and use of breathing devices (flutter valve).  Will be switching from CPAP to BiPAP, getting equipment from [facility] instead of [facility].  Per chart, order has been placed.  He will follow up with PCP on 1/4 and with pulmonology of 2/3.  Concerned about dental extractions soon, will ask physicians about clearance.    Member report there may be concern that shortness of breath may be caused by heart condition, history of CHF.  He does weight daily, today was 200.4.  Discussed the most accurate way to record weights (first thing in the morning, after voiding, and with minimal clothing), he verbalizes understanding.  Educated on heart failure zones, will follow up with cardiology on 1/21.  Denies any urgent concerns, encouraged to contact this care manager with questions.  Encounter Medications:  Outpatient Encounter Medications as of 09/18/2020  Medication Sig  . acetaminophen (TYLENOL) 325 MG tablet Take 650 mg by mouth every 6 (six) hours as needed for mild pain or headache.  . albuterol (PROVENTIL HFA;VENTOLIN HFA) 108 (90 Base) MCG/ACT inhaler Inhale 1-2 puffs into the lungs every 4 (four) hours as needed for wheezing or shortness of breath.   [name] albuterol (PROVENTIL) (2.5 MG/3ML) 0.083% nebulizer solution INHALE 1 VIAL VIA NEBULIZER 5 TIMES DAILY AS NEEDED FOR  WHEEZING OR FOR SHORTNESS OF BREATH (Patient taking differently: Take 2.5 mg by nebulization See admin instructions. 5 times daily prn wheezing or shortness of breath)  . apixaban (ELIQUIS) 5 MG TABS tablet Take 1 tablet (5 mg total) by mouth 2 (two) times daily.  . ARIPiprazole (ABILIFY) 5 MG tablet Take 5 mg by mouth daily.  . Blood Glucose Monitoring Suppl (ONE TOUCH ULTRA 2) w/Device KIT Use to test blood sugars 1-2 times daily.  . busPIRone (BUSPAR) 15 MG tablet Take 1 tablet (15 mg total) by mouth 2 (two) times daily.  . carvedilol (COREG) 12.5 MG tablet Take 1 tablet (12.5 mg total) by mouth 2 (two) times daily with a meal.  . DM-GG & DM-APAP-CPM (CORICIDIN HBP DAY/NIGHT COLD) 10-20 &15-200-2 MG MISC Take 1 tablet by mouth every 12 (twelve) hours as needed (cold symptoms).  . famotidine (PEPCID) 20 MG tablet Take 20 mg by mouth daily.  [name] FLUoxetine (PROZAC) 20 MG capsule TAKE 3 CAPSULES BY MOUTH  DAILY (Patient taking differently: Take 60 mg by mouth daily.)  . Fluticasone-Umeclidin-Vilant (TRELEGY ELLIPTA) 100-62.5-25 MCG/INH AEPB Inhale 1 puff into the lungs daily.  . furosemide (LASIX) 40 MG tablet Take 1 tablet (40 mg total) by mouth 2 (two) times daily.  [name] gabapentin (NEURONTIN) 300 MG capsule TAKE ONE CAPSULE BY MOUTH THREE TIMES DAILY (Patient taking differently: Take 300 mg by mouth 3 (three) times daily.)  . glucose blood (ONETOUCH ULTRA) test strip USE TO TEST BLOOD SUGAR 1-2 TIMES DAILY  . ipratropium (ATROVENT) 0.02 % nebulizer solution Take 2.5 mLs (0.5 mg total) by nebulization 4 (  four) times daily.  . ipratropium-albuterol (DUONEB) 0.5-2.5 (3) MG/3ML SOLN Take 3 mLs by nebulization every 6 (six) hours as needed. (Patient taking differently: Take 3 mLs by nebulization every 6 (six) hours as needed (wheezing or shortness of breath).)  . isosorbide mononitrate (IMDUR) 30 MG 24 hr tablet Take 30 mg by mouth daily.  . Lancets (ONETOUCH ULTRASOFT) lancets USE TO TEST BLOOD SUGAR 1-2  TIMES DAILY  . lubiprostone ([name]) 8 MCG capsule TAKE 1 CAPSULE(8 MCG) BY MOUTH TWICE DAILY WITH A MEAL (Patient taking differently: Take 8 mcg by mouth 2 (two) times daily with a meal.)  . Multiple Vitamin (MULTIVITAMIN WITH MINERALS) TABS tablet Take 1 tablet by mouth daily.  . nitroGLYCERIN (NITROSTAT) 0.4 MG SL tablet DISSOLVE 1 TABLET UNDER THE TONGUE EVERY 5 MINUTES AS  NEEDED FOR CHEST PAIN. MAX  OF 3 TABLETS IN 15 MINUTES. CALL 911 IF PAIN PERSISTS. (Patient taking differently: Place 0.4 mg under the tongue every 5 (five) minutes as needed for chest pain.)  . pantoprazole (PROTONIX) 40 MG tablet TAKE ONE TABLET BY MOUTH EVERY MORNING (Patient taking differently: Take 40 mg by mouth daily.)  . rosuvastatin (CRESTOR) 20 MG tablet Take 1 tablet (20 mg total) by mouth daily.  . traZODone (DESYREL) 50 MG tablet TAKE 1/2 TO 1 TABLET BY  MOUTH AT BEDTIME AS NEEDED  FOR SLEEP. (Patient taking differently: Take 25-50 mg by mouth at bedtime as needed for sleep.)  . WIXELA INHUB 250-50 MCG/DOSE AEPB Inhale 1 puff into the lungs 2 (two) times daily.   No facility-administered encounter medications on file as of 09/18/2020.    Functional Status:  In your present state of health, do you have any difficulty performing the following activities: 09/14/2020 06/21/2020  Hearing? N N  Vision? N N  Difficulty concentrating or making decisions? N N  Walking or climbing stairs? N Y  Dressing or bathing? N N  Doing errands, shopping? N Y  Some recent data might be hidden    Fall/Depression Screening: Fall Risk  07/02/2020 04/24/2020 02/15/2020  Falls in the past year? 0 0 0  Number falls in past yr: - - 0  Injury with Fall? - - 0   PHQ 2/9 Scores 08/27/2020 07/02/2020 04/24/2020 03/13/2020 02/15/2020 05/20/2019 11/15/2018  PHQ - 2 Score 0 0 0 5 0 2 2  PHQ- 9 Score - - - 17 - 7 11  Exception Documentation - - - (No Data) - - -    Assessment:  Goals Addressed            This Visit's Progress   . THN  - Track and Manage Fluids and Swelling-Heart Failure       Timeframe:  Short-Term Goal Priority:  Medium Start Date:     09/18/2020                        Expected End Date:    10/19/2020                   Follow Up Date 10/02/2020   - track weight in diary - use salt in moderation - watch for swelling in feet, ankles and legs every day - weigh myself daily    Why is this important?    It is important to check your weight daily and watch how much salt and liquids you have.   It will help you to manage your heart failure.    Notes:     .   THN - Track and Manage My Symptoms   On track    Timeframe:  Short-Term Goal Priority:  Medium Start Date:     09/04/2020                        Expected End Date:    10/05/2020                     - develop a rescue plan - keep follow-up appointments    Why is this important?   Tracking your symptoms and other information about your health helps your doctor plan your care.  Write down the symptoms, the time of day, what you were doing and what medicine you are taking.  You will soon learn how to manage your symptoms.     Notes:   11/10 - reminded of appointments next week  12/14 - Reviewed upcoming appointments with Cardiology and pulmonology    . THN - Track and Manage My Triggers   On track    Timeframe:  Long-Range Goal Priority:  High Start Date:        09/04/2020                     Expected End Date:         11/05/2020                - eliminate smoking in my home - identify and remove indoor air pollutants - listen for public air quality announcements every day    Why is this important?   Triggers are activities or things, like tobacco smoke or cold weather, that make your COPD (chronic obstructive pulmonary disease) flare-up.  Knowing these triggers helps you plan how to stay away from them.  When you cannot remove them, you can learn how to manage them.     Notes:   11/10 - Reviewed use of inhalers and oxygen  12/14  - Reviewed COPD action plan, encouraged to contact provider office with concerns       Plan:  Follow-up:  Patient agrees to Care Plan and Follow-up.  Will send Living Better with Heart Failure booklet to include heart failure action plan.  Will send [facility] calendar book with monitoring logs.  Will follow up within the next 2 weeks.  Valente David, [place], MSN [place] [phone]

## 2020-09-19 DIAGNOSIS — J449 Chronic obstructive pulmonary disease, unspecified: Secondary | ICD-10-CM | POA: Diagnosis not present

## 2020-09-23 ENCOUNTER — Encounter (HOSPITAL_COMMUNITY): Payer: Self-pay

## 2020-09-23 ENCOUNTER — Observation Stay (HOSPITAL_COMMUNITY)
Admission: EM | Admit: 2020-09-23 | Discharge: 2020-09-24 | Disposition: A | Discharge status: Home / Self Care | Payer: Medicare Other | Attending: Internal Medicine | Admitting: Internal Medicine

## 2020-09-23 ENCOUNTER — Other Ambulatory Visit: Payer: Self-pay

## 2020-09-23 ENCOUNTER — Emergency Department (HOSPITAL_COMMUNITY): Payer: Medicare Other

## 2020-09-23 ENCOUNTER — Observation Stay (HOSPITAL_COMMUNITY): Payer: Medicare Other

## 2020-09-23 DIAGNOSIS — I152 Hypertension secondary to endocrine disorders: Secondary | ICD-10-CM | POA: Diagnosis present

## 2020-09-23 DIAGNOSIS — Z7901 Long term (current) use of anticoagulants: Secondary | ICD-10-CM | POA: Diagnosis not present

## 2020-09-23 DIAGNOSIS — Z87891 Personal history of nicotine dependence: Secondary | ICD-10-CM | POA: Insufficient documentation

## 2020-09-23 DIAGNOSIS — R0603 Acute respiratory distress: Secondary | ICD-10-CM | POA: Diagnosis not present

## 2020-09-23 DIAGNOSIS — F32A Depression, unspecified: Secondary | ICD-10-CM | POA: Diagnosis present

## 2020-09-23 DIAGNOSIS — I130 Hypertensive heart and chronic kidney disease with heart failure and stage 1 through stage 4 chronic kidney disease, or unspecified chronic kidney disease: Secondary | ICD-10-CM | POA: Diagnosis not present

## 2020-09-23 DIAGNOSIS — E1122 Type 2 diabetes mellitus with diabetic chronic kidney disease: Secondary | ICD-10-CM | POA: Diagnosis not present

## 2020-09-23 DIAGNOSIS — Z79899 Other long term (current) drug therapy: Secondary | ICD-10-CM | POA: Insufficient documentation

## 2020-09-23 DIAGNOSIS — J80 Acute respiratory distress syndrome: Secondary | ICD-10-CM | POA: Diagnosis not present

## 2020-09-23 DIAGNOSIS — I480 Paroxysmal atrial fibrillation: Secondary | ICD-10-CM | POA: Insufficient documentation

## 2020-09-23 DIAGNOSIS — R069 Unspecified abnormalities of breathing: Secondary | ICD-10-CM | POA: Diagnosis not present

## 2020-09-23 DIAGNOSIS — R0600 Dyspnea, unspecified: Secondary | ICD-10-CM | POA: Diagnosis not present

## 2020-09-23 DIAGNOSIS — G4733 Obstructive sleep apnea (adult) (pediatric): Secondary | ICD-10-CM | POA: Diagnosis present

## 2020-09-23 DIAGNOSIS — I5023 Acute on chronic systolic (congestive) heart failure: Secondary | ICD-10-CM | POA: Diagnosis present

## 2020-09-23 DIAGNOSIS — N1831 Chronic kidney disease, stage 3a: Secondary | ICD-10-CM | POA: Diagnosis not present

## 2020-09-23 DIAGNOSIS — E119 Type 2 diabetes mellitus without complications: Secondary | ICD-10-CM

## 2020-09-23 DIAGNOSIS — I10 Essential (primary) hypertension: Secondary | ICD-10-CM | POA: Diagnosis present

## 2020-09-23 DIAGNOSIS — Z20822 Contact with and (suspected) exposure to covid-19: Secondary | ICD-10-CM | POA: Insufficient documentation

## 2020-09-23 DIAGNOSIS — R0602 Shortness of breath: Secondary | ICD-10-CM | POA: Diagnosis not present

## 2020-09-23 DIAGNOSIS — I5043 Acute on chronic combined systolic (congestive) and diastolic (congestive) heart failure: Secondary | ICD-10-CM | POA: Insufficient documentation

## 2020-09-23 DIAGNOSIS — N179 Acute kidney failure, unspecified: Secondary | ICD-10-CM | POA: Diagnosis not present

## 2020-09-23 DIAGNOSIS — J45909 Unspecified asthma, uncomplicated: Secondary | ICD-10-CM | POA: Diagnosis not present

## 2020-09-23 DIAGNOSIS — J441 Chronic obstructive pulmonary disease with (acute) exacerbation: Secondary | ICD-10-CM | POA: Diagnosis not present

## 2020-09-23 DIAGNOSIS — F418 Other specified anxiety disorders: Secondary | ICD-10-CM | POA: Diagnosis present

## 2020-09-23 DIAGNOSIS — N189 Chronic kidney disease, unspecified: Secondary | ICD-10-CM | POA: Diagnosis present

## 2020-09-23 DIAGNOSIS — R61 Generalized hyperhidrosis: Secondary | ICD-10-CM | POA: Diagnosis not present

## 2020-09-23 DIAGNOSIS — E1159 Type 2 diabetes mellitus with other circulatory complications: Secondary | ICD-10-CM | POA: Diagnosis present

## 2020-09-23 DIAGNOSIS — I5042 Chronic combined systolic (congestive) and diastolic (congestive) heart failure: Secondary | ICD-10-CM | POA: Diagnosis present

## 2020-09-23 LAB — CBC WITH DIFFERENTIAL/PLATELET
Abs Immature Granulocytes: 0.11 10*3/uL — ABNORMAL HIGH (ref 0.00–0.07)
Basophils Absolute: 0 10*3/uL (ref 0.0–0.1)
Basophils Relative: 0 %
Eosinophils Absolute: 0.2 10*3/uL (ref 0.0–0.5)
Eosinophils Relative: 1 %
HCT: 35.1 % — ABNORMAL LOW (ref 39.0–52.0)
Hemoglobin: 11 g/dL — ABNORMAL LOW (ref 13.0–17.0)
Immature Granulocytes: 1 %
Lymphocytes Relative: 9 %
Lymphs Abs: 1.3 10*3/uL (ref 0.7–4.0)
MCH: 29.3 pg (ref 26.0–34.0)
MCHC: 31.3 g/dL (ref 30.0–36.0)
MCV: 93.4 fL (ref 80.0–100.0)
Monocytes Absolute: 1.2 10*3/uL — ABNORMAL HIGH (ref 0.1–1.0)
Monocytes Relative: 8 %
Neutro Abs: 11.8 10*3/uL — ABNORMAL HIGH (ref 1.7–7.7)
Neutrophils Relative %: 81 %
Platelets: 260 10*3/uL (ref 150–400)
RBC: 3.76 MIL/uL — ABNORMAL LOW (ref 4.22–5.81)
RDW: 13.4 % (ref 11.5–15.5)
WBC: 14.6 10*3/uL — ABNORMAL HIGH (ref 4.0–10.5)
nRBC: 0 % (ref 0.0–0.2)

## 2020-09-23 LAB — BASIC METABOLIC PANEL
Anion gap: 13 (ref 5–15)
BUN: 14 mg/dL (ref 6–20)
CO2: 27 mmol/L (ref 22–32)
Calcium: 8.5 mg/dL — ABNORMAL LOW (ref 8.9–10.3)
Chloride: 98 mmol/L (ref 98–111)
Creatinine, Ser: 1.89 mg/dL — ABNORMAL HIGH (ref 0.61–1.24)
GFR, Estimated: 40 mL/min — ABNORMAL LOW (ref >60)
Glucose, Bld: 204 mg/dL — ABNORMAL HIGH (ref 70–99)
Potassium: 4.2 mmol/L (ref 3.5–5.1)
Sodium: 138 mmol/L (ref 135–145)

## 2020-09-23 LAB — CBG MONITORING, ED
Glucose-Capillary: 168 mg/dL — ABNORMAL HIGH (ref 70–99)
Glucose-Capillary: 238 mg/dL — ABNORMAL HIGH (ref 70–99)
Glucose-Capillary: 166 mg/dL — ABNORMAL HIGH (ref 70–99)

## 2020-09-23 LAB — I-STAT ARTERIAL BLOOD GAS, ED
Acid-Base Excess: 5 mmol/L — ABNORMAL HIGH (ref 0.0–2.0)
Bicarbonate: 32.5 mmol/L — ABNORMAL HIGH (ref 20.0–28.0)
Calcium, Ion: 1.2 mmol/L (ref 1.15–1.40)
HCT: 33 % — ABNORMAL LOW (ref 39.0–52.0)
Hemoglobin: 11.2 g/dL — ABNORMAL LOW (ref 13.0–17.0)
O2 Saturation: 98 %
Patient temperature: 98.6
Potassium: 4.2 mmol/L (ref 3.5–5.1)
Sodium: 136 mmol/L (ref 135–145)
TCO2: 34 mmol/L — ABNORMAL HIGH (ref 22–32)
pCO2 arterial: 64.8 mmHg — ABNORMAL HIGH (ref 32.0–48.0)
pH, Arterial: 7.308 — ABNORMAL LOW (ref 7.350–7.450)
pO2, Arterial: 121 mmHg — ABNORMAL HIGH (ref 83.0–108.0)

## 2020-09-23 LAB — BRAIN NATRIURETIC PEPTIDE: B Natriuretic Peptide: 1083.5 pg/mL — ABNORMAL HIGH (ref 0.0–100.0)

## 2020-09-23 LAB — RESP PANEL BY RT-PCR (FLU A&B, COVID) ARPGX2
Influenza A by PCR: NEGATIVE
Influenza B by PCR: NEGATIVE
SARS Coronavirus 2 by RT PCR: NEGATIVE

## 2020-09-23 LAB — TROPONIN I (HIGH SENSITIVITY)
Troponin I (High Sensitivity): 16 ng/L (ref <18)
Troponin I (High Sensitivity): 26 ng/L — ABNORMAL HIGH (ref <18)

## 2020-09-23 LAB — GLUCOSE, CAPILLARY: Glucose-Capillary: 182 mg/dL — ABNORMAL HIGH (ref 70–99)

## 2020-09-23 LAB — POC SARS CORONAVIRUS 2 AG -  ED: SARS Coronavirus 2 Ag: NEGATIVE

## 2020-09-23 MED ORDER — FLUOXETINE HCL 20 MG PO CAPS
60.0000 mg | ORAL_CAPSULE | Freq: Every day | ORAL | Status: DC
Start: 2020-09-23 — End: 2020-09-24
  Administered 2020-09-24: 60 mg via ORAL
  Filled 2020-09-23: qty 3

## 2020-09-23 MED ORDER — CARVEDILOL 12.5 MG PO TABS
12.5000 mg | ORAL_TABLET | Freq: Two times a day (BID) | ORAL | Status: DC
  Administered 2020-09-23 – 2020-09-24 (×2): 12.5 mg via ORAL
  Filled 2020-09-23: qty 1
  Filled 2020-09-23: qty 4

## 2020-09-23 MED ORDER — ALBUTEROL SULFATE (2.5 MG/3ML) 0.083% IN NEBU: 2.5000 mg | INHALATION_SOLUTION | RESPIRATORY_TRACT | Status: DC | PRN

## 2020-09-23 MED ORDER — GABAPENTIN 300 MG PO CAPS
300.0000 mg | ORAL_CAPSULE | Freq: Three times a day (TID) | ORAL | Status: DC
  Administered 2020-09-23 – 2020-09-24 (×4): 300 mg via ORAL
  Filled 2020-09-23 (×4): qty 1

## 2020-09-23 MED ORDER — FUROSEMIDE 20 MG PO TABS
40.0000 mg | ORAL_TABLET | Freq: Two times a day (BID) | ORAL | Status: DC
Start: 2020-09-23 — End: 2020-09-23
  Filled 2020-09-23: qty 2

## 2020-09-23 MED ORDER — GUAIFENESIN ER 600 MG PO TB12
600.0000 mg | ORAL_TABLET | Freq: Two times a day (BID) | ORAL | Status: DC
  Administered 2020-09-23 – 2020-09-24 (×2): 600 mg via ORAL
  Filled 2020-09-23 (×2): qty 1

## 2020-09-23 MED ORDER — DM-GG & DM-APAP-CPM 10-20 &15-200-2 MG PO MISC: 1.0000 | Freq: Two times a day (BID) | ORAL | Status: DC | PRN

## 2020-09-23 MED ORDER — ALBUTEROL (5 MG/ML) CONTINUOUS INHALATION SOLN
10.0000 mg/h | INHALATION_SOLUTION | Freq: Once | RESPIRATORY_TRACT | Status: AC
  Administered 2020-09-23: 10 mg/h via RESPIRATORY_TRACT
  Filled 2020-09-23: qty 20

## 2020-09-23 MED ORDER — SODIUM CHLORIDE 0.9% FLUSH
3.0000 mL | Freq: Two times a day (BID) | INTRAVENOUS | Status: DC
  Administered 2020-09-23 – 2020-09-24 (×2): 3 mL via INTRAVENOUS

## 2020-09-23 MED ORDER — METHYLPREDNISOLONE SODIUM SUCC 125 MG IJ SOLR
60.0000 mg | Freq: Two times a day (BID) | INTRAMUSCULAR | Status: AC
  Administered 2020-09-23 – 2020-09-24 (×2): 60 mg via INTRAVENOUS
  Filled 2020-09-23 (×2): qty 2

## 2020-09-23 MED ORDER — BUSPIRONE HCL 5 MG PO TABS
15.0000 mg | ORAL_TABLET | Freq: Two times a day (BID) | ORAL | Status: DC
  Administered 2020-09-23 – 2020-09-24 (×3): 15 mg via ORAL
  Filled 2020-09-23 (×2): qty 3
  Filled 2020-09-23: qty 2

## 2020-09-23 MED ORDER — FLUTICASONE FUROATE-VILANTEROL 200-25 MCG/INH IN AEPB
1.0000 | INHALATION_SPRAY | Freq: Every day | RESPIRATORY_TRACT | Status: DC
  Filled 2020-09-23 (×2): qty 28

## 2020-09-23 MED ORDER — FLUTICASONE FUROATE-VILANTEROL 100-25 MCG/INH IN AEPB
1.0000 | INHALATION_SPRAY | Freq: Every day | RESPIRATORY_TRACT | Status: DC
  Filled 2020-09-23: qty 28

## 2020-09-23 MED ORDER — UMECLIDINIUM BROMIDE 62.5 MCG/INH IN AEPB
1.0000 | INHALATION_SPRAY | Freq: Every day | RESPIRATORY_TRACT | Status: DC
  Administered 2020-09-23: 1 via RESPIRATORY_TRACT
  Filled 2020-09-23 (×2): qty 7

## 2020-09-23 MED ORDER — FLUTICASONE FUROATE-VILANTEROL 200-25 MCG/INH IN AEPB: 1.0000 | INHALATION_SPRAY | Freq: Every day | RESPIRATORY_TRACT | Status: DC

## 2020-09-23 MED ORDER — APIXABAN 5 MG PO TABS
5.0000 mg | ORAL_TABLET | Freq: Two times a day (BID) | ORAL | Status: DC
Start: 2020-09-23 — End: 2020-09-24
  Administered 2020-09-23 – 2020-09-24 (×3): 5 mg via ORAL
  Filled 2020-09-23 (×3): qty 1

## 2020-09-23 MED ORDER — PANTOPRAZOLE SODIUM 40 MG PO TBEC
40.0000 mg | DELAYED_RELEASE_TABLET | Freq: Every day | ORAL | Status: DC
Start: 2020-09-23 — End: 2020-09-24
  Administered 2020-09-23 – 2020-09-24 (×2): 40 mg via ORAL
  Filled 2020-09-23 (×2): qty 1

## 2020-09-23 MED ORDER — PREDNISONE 20 MG PO TABS
40.0000 mg | ORAL_TABLET | Freq: Every day | ORAL | Status: DC
  Administered 2020-09-24: 40 mg via ORAL
  Filled 2020-09-23 (×2): qty 2

## 2020-09-23 MED ORDER — IPRATROPIUM-ALBUTEROL 0.5-2.5 (3) MG/3ML IN SOLN
3.0000 mL | Freq: Four times a day (QID) | RESPIRATORY_TRACT | Status: DC
  Administered 2020-09-23 – 2020-09-24 (×3): 3 mL via RESPIRATORY_TRACT
  Filled 2020-09-23 (×4): qty 3

## 2020-09-23 MED ORDER — ARIPIPRAZOLE 5 MG PO TABS
5.0000 mg | ORAL_TABLET | Freq: Every day | ORAL | Status: DC
  Administered 2020-09-23 – 2020-09-24 (×2): 5 mg via ORAL
  Filled 2020-09-23 (×2): qty 1

## 2020-09-23 MED ORDER — ROSUVASTATIN CALCIUM 20 MG PO TABS
20.0000 mg | ORAL_TABLET | Freq: Every day | ORAL | Status: DC
  Administered 2020-09-23 – 2020-09-24 (×2): 20 mg via ORAL
  Filled 2020-09-23 (×2): qty 1

## 2020-09-23 MED ORDER — FAMOTIDINE 20 MG PO TABS
20.0000 mg | ORAL_TABLET | Freq: Every day | ORAL | Status: DC
  Administered 2020-09-23 – 2020-09-24 (×2): 20 mg via ORAL
  Filled 2020-09-23 (×2): qty 1

## 2020-09-23 MED ORDER — ISOSORBIDE MONONITRATE ER 30 MG PO TB24
30.0000 mg | ORAL_TABLET | Freq: Every day | ORAL | Status: DC
  Administered 2020-09-23 – 2020-09-24 (×2): 30 mg via ORAL
  Filled 2020-09-23 (×2): qty 1

## 2020-09-23 MED ORDER — LUBIPROSTONE 8 MCG PO CAPS
8.0000 ug | ORAL_CAPSULE | Freq: Two times a day (BID) | ORAL | Status: DC
  Administered 2020-09-24: 8 ug via ORAL
  Filled 2020-09-23 (×2): qty 1

## 2020-09-23 MED ORDER — LACTATED RINGERS IV SOLN: INTRAVENOUS | Status: DC

## 2020-09-23 MED ORDER — INSULIN ASPART 100 UNIT/ML ~~LOC~~ SOLN
0.0000 [IU] | Freq: Three times a day (TID) | SUBCUTANEOUS | Status: DC
  Administered 2020-09-23: 5 [IU] via SUBCUTANEOUS
  Administered 2020-09-23 – 2020-09-24 (×2): 3 [IU] via SUBCUTANEOUS

## 2020-09-23 MED ORDER — TRAZODONE HCL 50 MG PO TABS
50.0000 mg | ORAL_TABLET | Freq: Every evening | ORAL | Status: DC | PRN
  Filled 2020-09-23: qty 1

## 2020-09-23 NOTE — ED Triage Notes (Signed)
Pt from home for eval of SHOB. Per EMS, pt was using home CPAP machine on arrival to relieve symptoms, received 1.5 duoneb, 2 mag, 125 solumedrol, 0.3 epi  20RAC MD Pollina at bedside on arrival

## 2020-09-23 NOTE — ED Notes (Signed)
Pt placed on NRB on arrival, O2 sats 98%

## 2020-09-23 NOTE — ED Provider Notes (Signed)
Patient seen after prior ED provider.  Patient does appear to be improved.  He is currently on 2 L nasal cannula.  He is breathing comfortably.  He is speaking in full sentences.  Presentation is concerning given recent admission with intubation being required for respiratory distress/arrest.  Patient's presentation this morning apparently was significant and concerning to both EMS and the initial ED provider.  He does appear to be improved now.  Case discussed with hospitalist service Ophelia Charter) regarding observation admission. Patient understands and is in agreement with plan of care.      Wynetta Fines, MD 09/23/20 [phone]

## 2020-09-23 NOTE — H&P (Signed)
History and Physical    Gunter Conde MHD:[id number] DOB: [birth year] DOA: 09/23/2020  PCP: [name], MD Consultants:  Harrell Gave - cardiology; [place] - pulmonology Patient coming from:  Home; NOK: Significant other, Deloris [place], [phone]  Chief Complaint: SOB  HPI: Kenneth [name] is a 60 y.o. male with medical history significant of OSA on CPAP; HTN; HLD; COPD on 3L home O2; DM; afib; and chronic diastolic CHF presenting with SOB.  He was previously admitted from 12/22-26 for acute respiratory failure from acute on chronic diastolic CHF and required intubation/mechnical ventilation overnight, improved with diuresis.  He reports that after his hospital discharge, he "stuck to the plan" - he was careful with his diet, was compliant with medications, and weighed himself daily.  He has noticed that when he eats a big meal he has fecal urgency with mildly loose stools, and after a large BM notes difficulty with his breathing.  This AM, he felt SOB shortly after waking up, having not slept well.  Shortly thereafter, he "couldn't breathe" and so called 911.  He has not had cough.  After significant EMS and ER treatments, he is now feeling good and back to his usual baseline of 2.5L [place] O2.    ED Course:  Previously admitted and intubated.  Hyperbaric then.  Awoke this AM, very SOB despite CPAP.  EMS gave solumedrol, mag, epi, breathing treatments.  Improved with treatments, evaluation unremarkable.  BNP better, WBC 14, hypercarbic today, COVID negative.  Appears to be COPD.  Review of Systems: As per HPI; otherwise review of systems reviewed and negative.   Ambulatory Status:  Ambulates without assistance  COVID Vaccine Status:  Complete  Past Medical History:  Diagnosis Date  . Anxiety   . Arthritis   . Atrial fibrillation ([place])   . CHF (congestive heart failure) ([place])   . COPD (chronic obstructive pulmonary disease) (HCC)    emphysema  . Depression   . Diabetes mellitus  without complication ([place])   . Heart murmur   . Hyperlipidemia   . Hypertension   . Sleep apnea with use of continuous positive airway pressure (CPAP)     Past Surgical History:  Procedure Laterality Date  . NO PAST SURGERIES      Social History   Socioeconomic History  . Marital status: Divorced    Spouse name: Not on file  . Number of children: 1  . Years of education: Not on file  . Highest education level: Not on file  Occupational History  . Occupation: disabled  Tobacco Use  . Smoking status: Former Smoker    Packs/day: 2.00    Years: 30.00    Pack years: 60.00    Types: Cigarettes    Quit date: 05/01/2019    Years since quitting: 1.4  . Smokeless tobacco: Never Used  . Tobacco comment: smokes 2-3cigs per day  Vaping Use  . Vaping Use: Never used  Substance and Sexual Activity  . Alcohol use: Yes    Comment: Drinks up to six beers around a game  . Drug use: Yes    Types: Marijuana    Comment: occ marijuana  . Sexual activity: Yes    Partners: Female  Other Topics Concern  . Not on file  Social History Narrative  . Not on file   Social Determinants of Health   Financial Resource Strain: Low Risk   . Difficulty of Paying Living Expenses: Not hard at all  Food Insecurity: No Food Insecurity  . Worried  About Running Out of Food in the Last Year: Never true  . Ran Out of Food in the Last Year: Never true  Transportation Needs: No Transportation Needs  . Lack of Transportation (Medical): No  . Lack of Transportation (Non-Medical): No  Physical Activity: Not on file  Stress: Not on file  Social Connections: Not on file  Intimate Partner Violence: Not on file    Allergies  Allergen Reactions  . Lisinopril Swelling  . Other Other (See Comments)    Lettuce : rash  . Tomato Rash    Family History  Problem Relation Age of Onset  . Diabetes Mother   . Heart disease Mother   . Diabetes Father   . Heart disease Father   . Diabetes Sister   . Heart  disease Sister   . Kidney disease Sister   . Coronary artery disease Brother   . Liver disease Maternal Uncle     Prior to Admission medications   Medication Sig Start Date End Date Taking? Authorizing Provider  acetaminophen (TYLENOL) 325 MG tablet Take 650 mg by mouth every 6 (six) hours as needed for mild pain or headache.    [provider]  albuterol (PROVENTIL HFA;VENTOLIN HFA) 108 (90 Base) MCG/ACT inhaler Inhale 1-2 puffs into the lungs every 4 (four) hours as needed for wheezing or shortness of breath.  06/19/16   [provider]  albuterol (PROVENTIL) (2.5 MG/3ML) 0.083% nebulizer solution INHALE 1 VIAL VIA NEBULIZER 5 TIMES DAILY AS NEEDED FOR WHEEZING OR FOR SHORTNESS OF BREATH Patient taking differently: Take 2.5 mg by nebulization See admin instructions. 5 times daily prn wheezing or shortness of breath 05/22/20   [name], MD  apixaban (ELIQUIS) 5 MG TABS tablet Take 1 tablet (5 mg total) by mouth 2 (two) times daily. 04/23/20   Buford Dresser, MD  ARIPiprazole (ABILIFY) 5 MG tablet Take 5 mg by mouth daily. 08/24/20   [provider]  Blood Glucose Monitoring Suppl (ONE TOUCH ULTRA 2) w/Device KIT Use to test blood sugars 1-2 times daily. 04/02/20   [name], MD  busPIRone (BUSPAR) 15 MG tablet Take 1 tablet (15 mg total) by mouth 2 (two) times daily. 09/06/19   [name], MD  carvedilol (COREG) 12.5 MG tablet Take 1 tablet (12.5 mg total) by mouth 2 (two) times daily with a meal. 04/23/20   Buford Dresser, MD  DM-GG & DM-APAP-CPM (CORICIDIN HBP DAY/NIGHT COLD) 10-20 &15-200-2 MG MISC Take 1 tablet by mouth every 12 (twelve) hours as needed (cold symptoms).    [provider]  famotidine (PEPCID) 20 MG tablet Take 20 mg by mouth daily.    [provider]  FLUoxetine (PROZAC) 20 MG capsule TAKE 3 CAPSULES BY MOUTH  DAILY Patient taking differently: Take 60 mg by mouth daily. 05/26/19    [name], MD  Fluticasone-Umeclidin-Vilant (TRELEGY ELLIPTA) 100-62.5-25 MCG/INH AEPB Inhale 1 puff into the lungs daily. 04/02/20   Collene Gobble, MD  furosemide (LASIX) 40 MG tablet Take 1 tablet (40 mg total) by mouth 2 (two) times daily. 08/02/20   Buford Dresser, MD  gabapentin (NEURONTIN) 300 MG capsule TAKE ONE CAPSULE BY MOUTH THREE TIMES DAILY Patient taking differently: Take 300 mg by mouth 3 (three) times daily. 08/03/20   [name], MD  glucose blood [facility] ULTRA) test strip USE TO TEST BLOOD SUGAR 1-2 TIMES DAILY 05/22/20   [name], MD  ipratropium (ATROVENT) 0.02 % nebulizer solution Take  2.5 mLs (0.5 mg total) by nebulization 4 (four) times daily. 08/05/19   Law, Bea Graff, PA-C  ipratropium-albuterol (DUONEB) 0.5-2.5 (3) MG/3ML SOLN Take 3 mLs by nebulization every 6 (six) hours as needed. Patient taking differently: Take 3 mLs by nebulization every 6 (six) hours as needed (wheezing or shortness of breath). 08/24/20   Dutch Quint B, FNP  isosorbide mononitrate (IMDUR) 30 MG 24 hr tablet Take 30 mg by mouth daily. 07/04/20   [provider]  Lancets [facility] ULTRASOFT) lancets USE TO TEST BLOOD SUGAR 1-2 TIMES DAILY 05/22/20   [name], MD  lubiprostone ([name]) 8 MCG capsule TAKE 1 CAPSULE(8 MCG) BY MOUTH TWICE DAILY WITH A MEAL Patient taking differently: Take 8 mcg by mouth 2 (two) times daily with a meal. 04/30/20   Esterwood, Amy S, PA-C  Multiple Vitamin (MULTIVITAMIN WITH MINERALS) TABS tablet Take 1 tablet by mouth daily.    [provider]  nitroGLYCERIN (NITROSTAT) 0.4 MG SL tablet DISSOLVE 1 TABLET UNDER THE TONGUE EVERY 5 MINUTES AS&nbsp;&nbsp;NEEDED FOR CHEST PAIN. MAX&nbsp;&nbsp;OF 3 TABLETS IN 15 MINUTES. CALL 911 IF PAIN PERSISTS. Patient taking differently: Place 0.4 mg under the tongue every 5 (five) minutes as needed for chest pain. 07/25/20   [name], MD   pantoprazole (PROTONIX) 40 MG tablet TAKE ONE TABLET BY MOUTH EVERY MORNING Patient taking differently: Take 40 mg by mouth daily. 08/03/20   Esterwood, Amy S, PA-C  rosuvastatin (CRESTOR) 20 MG tablet Take 1 tablet (20 mg total) by mouth daily. 08/27/20   Dutch Quint B, FNP  traZODone (DESYREL) 50 MG tablet TAKE 1/2 TO 1 TABLET BY  MOUTH AT BEDTIME AS NEEDED  FOR SLEEP. Patient taking differently: Take 25-50 mg by mouth at bedtime as needed for sleep. 05/26/19   [name], MD  WIXELA INHUB 250-50 MCG/DOSE AEPB Inhale 1 puff into the lungs 2 (two) times daily. 04/02/20   [provider]    Physical Exam: Vitals:   09/23/20 1351 09/23/20 1400 09/23/20 1700 09/23/20 1730  BP: (!) 172/83 (!) 167/78  (!) 161/83  Pulse: 85 84 73 78  Resp: 19 16 18 16   Temp:      TempSrc:      SpO2: 100% 100% 100% 100%  Weight:      Height:         . General:  Appears calm and comfortable and is in NAD, on 3.5L [place] O2 . Eyes:  PERRL, EOMI, normal lids, iris . ENT:  grossly normal hearing, lips & tongue, mmm; some absent mostly upper dentition . Neck:  no LAD, masses or thyromegaly . Cardiovascular:  RRR, no m/r/g. No LE edema.  [name] Respiratory:   Scattered wheezes with RUL rhonchi both anteriorly and poteriorly.  Normal respiratory effort. . Abdomen:  soft, NT, ND, NABS . Back:   normal alignment, no CVAT . Skin:  no rash or induration seen on limited exam . Musculoskeletal:  grossly normal tone BUE/BLE, good ROM, no bony abnormality . Psychiatric:  grossly normal mood and affect, speech fluent and appropriate, AOx3 . Neurologic:  CN 2-12 grossly intact, moves all extremities in coordinated fashion    Radiological Exams on Admission: Independently reviewed - see discussion in A/P where applicable  CT Chest Wo Contrast  Result Date: 09/23/2020 CLINICAL DATA:  COPD exacerbation, shortness of breath EXAM: CT CHEST WITHOUT CONTRAST TECHNIQUE: Multidetector CT imaging of the chest was  performed following the standard protocol without IV contrast. COMPARISON:  03/03/2020  FINDINGS: Cardiovascular: Aortic atherosclerosis. Heart is normal size. Aorta is normal caliber. Mediastinum/Nodes: No mediastinal, hilar, or axillary adenopathy. Trachea and esophagus are unremarkable. Thyroid unremarkable. Lungs/Pleura: Centrilobular and paraseptal emphysema. Peripheral platelike scarring or atelectasis in the left lower lobe and lingula. Linear scarring at the right base in both the right middle lobe and right lower lobe. No effusions. No confluent airspace opacities. Upper Abdomen: Imaging into the upper abdomen demonstrates no acute findings. Musculoskeletal: Chest wall soft tissues are unremarkable. No acute bony abnormality. IMPRESSION: Moderate centrilobular and paraseptal emphysema. Linear bibasilar scarring or atelectasis.  No confluent opacities. Aortic Atherosclerosis (ICD10-I70.0) and Emphysema (ICD10-J43.9). Electronically Signed   By: Rolm Baptise M.D.   On: 09/23/2020 11:57   DG Chest Port 1 View  Result Date: 09/23/2020 CLINICAL DATA:  Dyspnea EXAM: PORTABLE CHEST 1 VIEW COMPARISON:  09/14/2020 chest radiograph. FINDINGS: Stable cardiomediastinal silhouette with normal heart size. No pneumothorax. No pleural effusion. No pulmonary edema. Faint hazy bibasilar lung opacities, similar to prior radiograph. IMPRESSION: Faint hazy bibasilar lung opacities, similar to prior radiograph, more suggestive of atelectasis or nonspecific fibrosis. Electronically Signed   By: Ilona Sorrel M.D.   On: 09/23/2020 07:18    EKG: Independently reviewed.  NSR with rate 83; LVH; NSCSLT  Labs on Admission: I have personally reviewed the available labs and imaging studies at the time of the admission.  Pertinent labs:   ABG: 7.308/64.8/121/98% - interestingly, worse than when intubated on 12/22 Glucose 204 BUN 14/Creatinine 1.89/GFR 40; 27/1.33/>60 on 12/26 BNP 1083.5; 1514.3 on 12/2 HS troponin 16 WBC  14.6 Hgb 11.0 COVID/flu negative   Assessment/Plan Principal Problem:   COPD with acute exacerbation (HCC) Active Problems:   Essential hypertension   Controlled type 2 diabetes mellitus without complication, without long-term current use of insulin (HCC)   Depression   PAF (paroxysmal atrial fibrillation) (HCC)   Chronic combined systolic and diastolic heart failure (HCC)   Obstructive sleep apnea   Acute kidney injury superimposed on CKD ([place])  Acute on chronic respiratory distress associated with a COPD exacerbation -Patient had apparent acute bronchospasm this AM with sudden onset of respiratory distress -He was aggressively treated with nebs, CPAP; solumedrol, magnesium, and epinephrine with resultant improvement -By the time I evaluated the patient he appeared calm and comfortable on his home O2, 3-4L -He does not have fever and has mild leukocytosis.  -Chest x-ray is not consistent with pneumonia -He did have RUL rhonchi at the time of my evaluation and so chest CT (non contrast) was ordered; it shows moderate emphysema without opacities -will observe on telemetry overnight with possible d/c to home in AM if he continues to do well -Nebulizers: scheduled Duoneb and prn albuterol -Solu-Medrol 80 mg IV BID -> Prednisone 40 mg PO daily -Continue Trelegy (Breo and Incruse Elliptas) -Continue Mucinex, Duonebs, Albuterol -Coordinated care with TOC team/PT/OT/Nutrition/RT consults  Chronic combined CHF -Recent admission with exacerbation -While today's presentation could be c/w flash pulmonary edema, it did not appear to be that on evaluation -Will monitor for now  OSA  -Continue CPAP  HTN  -Continue Coreg, Norvasc, Imdur -Hold Lasix and Losartan given AKI  HLD -Continue Crestor  DM -Recent A1c was 6.3 -hold Glucophage -Cover with moderate-scale SSI  Afib -Rate controlled with Coreg -Continue Eliquis  AKI on Stage 3a CKD -Baseline creatinine appears to be about  1.3 -Current creatinine is 1.89 -Hold Lasix and provide gentle IVF infusion -Recheck BMP in AM  Anxiety/depression -Continue home meds - Abilify, Buspar,  Prozac, Trazodone, Neurontin    Note: This patient has been tested and is negative for the novel coronavirus COVID-19. He has been fully vaccinated against COVID-19.    DVT prophylaxis: Eliquis  Code Status:  Full - confirmed with patient Family Communication: None present Disposition Plan:  The patient is from: home  Anticipated d/c is to: home without [facility] services   Anticipated d/c date will depend on clinical response to treatment, but possibly as early as tomorrow if he has excellent response to treatment  Patient is currently: acutely ill Consults called: Nutrition/RT  Admission status: It is my clinical opinion that referral for OBSERVATION is reasonable and necessary in this patient based on the above information provided. The aforementioned taken together are felt to place the patient at high risk for further clinical deterioration. However it is anticipated that the patient may be medically stable for discharge from the hospital within 24 to 48 hours.    Karmen Bongo MD [facility]   How to contact the [facility] Attending or Consulting provider [place] or covering provider during after hours [place], for this patient?  1. Check the care team in [facility] and look for a) attending/consulting TR[facility] provider listed and b) the [facility] team listed 2. Log into [email or web address] and use 's universal password to access. If you do not have the password, please contact the hospital operator. 3. Locate the [facility] provider you are looking for under [facility] and page to a number that you can be directly reached. 4. If you still have difficulty reaching the provider, please page the [facility] (Director on Call) for the Hospitalists listed on amion for assistance.   09/23/2020, 6:52 PM

## 2020-09-23 NOTE — Plan of Care (Signed)
  Problem: Education: Goal: Knowledge of General Education information will improve Description Including pain rating scale, medication(s)/side effects and non-pharmacologic comfort measures Outcome: Progressing   Problem: Health Behavior/Discharge Planning: Goal: Ability to manage health-related needs will improve Outcome: Progressing   

## 2020-09-23 NOTE — ED Provider Notes (Signed)
[facility] EMERGENCY DEPARTMENT Provider Note   CSN: [id number] Arrival date & time: 09/23/20  0630     History Chief Complaint  Patient presents with  . Respiratory Distress    Kenneth [name] is a 60 y.o. male.  Patient presents to the emergency department for evaluation of respiratory distress.  Patient developed severe shortness of breath at home tonight.  EMS report that he was on his CPAP when they arrived to his home and was in distress.  They were never able to get him on room air to get a room air saturation.  Patient was continued on CPAP.  He was treated for possible asthma/COPD exacerbation with subcu epi, IV magnesium, IV Solu-Medrol and bronchodilators.  Patient has improved during transport.  He is not experiencing any chest pain.        Past Medical History:  Diagnosis Date  . Anxiety   . Arthritis   . Asthma   . Atrial fibrillation ([place])   . CHF (congestive heart failure) ([place])   . COPD (chronic obstructive pulmonary disease) ([place])   . Depression   . Diabetes mellitus without complication ([place])   . Emphysema of lung ([place])   . Heart murmur   . Hyperlipidemia   . Hypertension   . Sleep apnea with use of continuous positive airway pressure (CPAP)     Patient Active Problem List   Diagnosis Date Noted  . Acute respiratory failure ([place]) 09/13/2020  . Thiamine deficiency 07/02/2020  . Acute respiratory distress 06/21/2020  . Chronic respiratory failure with hypoxia ([place]) 06/01/2020  . Obstructive sleep apnea 05/02/2020  . Type 2 diabetes mellitus with diabetic neuropathy, without long-term current use of insulin ([place]) 04/24/2020  . Hyperlipidemia   . Acute on chronic combined systolic and diastolic CHF (congestive heart failure) ([place]) 04/07/2020  . Chronic constipation 07/19/2019  . Hospital discharge follow-up 06/17/2019  . Shortness of breath 06/12/2019  . Chest tightness 06/12/2019  . Snoring 03/29/2019  . Insomnia due to other  mental disorder 11/15/2018  . Anticoagulant long-term use 10/21/2018  . Gastroesophageal reflux disease without esophagitis 08/10/2018  . Tobacco abuse 08/10/2018  . Chest pain 07/27/2018  . PAF (paroxysmal atrial fibrillation) ([place]) 07/27/2018  . Mild renal insufficiency 07/27/2018  . Chronic combined systolic and diastolic heart failure ([place]) 07/27/2018  . Slow transit constipation 07/20/2018  . Essential hypertension 07/06/2018  . Chronic obstructive pulmonary disease ([place]) 07/06/2018  . Controlled type 2 diabetes mellitus without complication, without long-term current use of insulin ([place]) 07/06/2018  . Depression 07/06/2018  . Macrocytic anemia 07/06/2018  . Healthcare maintenance 07/06/2018  . Alcohol abuse 07/06/2018    Past Surgical History:  Procedure Laterality Date  . NO PAST SURGERIES         Family History  Problem Relation Age of Onset  . Diabetes Mother   . Heart disease Mother   . Diabetes Father   . Heart disease Father   . Diabetes Sister   . Heart disease Sister   . Kidney disease Sister   . Coronary artery disease Brother   . Liver disease Maternal Uncle     Social History   Tobacco Use  . Smoking status: Former Smoker    Packs/day: 2.00    Years: 30.00    Pack years: 60.00    Types: Cigarettes    Quit date: 05/01/2019    Years since quitting: 1.4  . Smokeless tobacco: Never Used  . Tobacco comment: smokes 2-3cigs per day  Vaping Use  . Vaping Use: Never used  Substance Use Topics  . Alcohol use: Yes    Comment: Drinks up to six beers around a game  . Drug use: Yes    Types: Marijuana    Comment: occ marijuana    Home Medications Prior to Admission medications   Medication Sig Start Date End Date Taking? Authorizing Provider  acetaminophen (TYLENOL) 325 MG tablet Take 650 mg by mouth every 6 (six) hours as needed for mild pain or headache.    [provider]  albuterol (PROVENTIL HFA;VENTOLIN HFA) 108 (90 Base) MCG/ACT inhaler  Inhale 1-2 puffs into the lungs every 4 (four) hours as needed for wheezing or shortness of breath.  06/19/16   [provider]  albuterol (PROVENTIL) (2.5 MG/3ML) 0.083% nebulizer solution INHALE 1 VIAL VIA NEBULIZER 5 TIMES DAILY AS NEEDED FOR WHEEZING OR FOR SHORTNESS OF BREATH Patient taking differently: Take 2.5 mg by nebulization See admin instructions. 5 times daily prn wheezing or shortness of breath 05/22/20   [name], MD  apixaban (ELIQUIS) 5 MG TABS tablet Take 1 tablet (5 mg total) by mouth 2 (two) times daily. 04/23/20   Buford Dresser, MD  ARIPiprazole (ABILIFY) 5 MG tablet Take 5 mg by mouth daily. 08/24/20   [provider]  Blood Glucose Monitoring Suppl (ONE TOUCH ULTRA 2) w/Device KIT Use to test blood sugars 1-2 times daily. 04/02/20   [name], MD  busPIRone (BUSPAR) 15 MG tablet Take 1 tablet (15 mg total) by mouth 2 (two) times daily. 09/06/19   [name], MD  carvedilol (COREG) 12.5 MG tablet Take 1 tablet (12.5 mg total) by mouth 2 (two) times daily with a meal. 04/23/20   Buford Dresser, MD  DM-GG & DM-APAP-CPM (CORICIDIN HBP DAY/NIGHT COLD) 10-20 &15-200-2 MG MISC Take 1 tablet by mouth every 12 (twelve) hours as needed (cold symptoms).    [provider]  famotidine (PEPCID) 20 MG tablet Take 20 mg by mouth daily.    [provider]  FLUoxetine (PROZAC) 20 MG capsule TAKE 3 CAPSULES BY MOUTH  DAILY Patient taking differently: Take 60 mg by mouth daily. 05/26/19   [name], MD  Fluticasone-Umeclidin-Vilant (TRELEGY ELLIPTA) 100-62.5-25 MCG/INH AEPB Inhale 1 puff into the lungs daily. 04/02/20   Collene Gobble, MD  furosemide (LASIX) 40 MG tablet Take 1 tablet (40 mg total) by mouth 2 (two) times daily. 08/02/20   Buford Dresser, MD  gabapentin (NEURONTIN) 300 MG capsule TAKE ONE CAPSULE BY MOUTH THREE TIMES DAILY Patient taking differently: Take 300 mg by mouth 3 (three)  times daily. 08/03/20   [name], MD  glucose blood [facility] ULTRA) test strip USE TO TEST BLOOD SUGAR 1-2 TIMES DAILY 05/22/20   [name], MD  ipratropium (ATROVENT) 0.02 % nebulizer solution Take 2.5 mLs (0.5 mg total) by nebulization 4 (four) times daily. 08/05/19   Law, Bea Graff, PA-C  ipratropium-albuterol (DUONEB) 0.5-2.5 (3) MG/3ML SOLN Take 3 mLs by nebulization every 6 (six) hours as needed. Patient taking differently: Take 3 mLs by nebulization every 6 (six) hours as needed (wheezing or shortness of breath). 08/24/20   Dutch Quint B, FNP  isosorbide mononitrate (IMDUR) 30 MG 24 hr tablet Take 30 mg by mouth daily. 07/04/20   [provider]  Lancets [facility] ULTRASOFT) lancets USE TO TEST BLOOD SUGAR 1-2 TIMES DAILY 05/22/20   [name], MD  lubiprostone ([name]) 8 MCG capsule TAKE 1  CAPSULE(8 MCG) BY MOUTH TWICE DAILY WITH A MEAL Patient taking differently: Take 8 mcg by mouth 2 (two) times daily with a meal. 04/30/20   Esterwood, Amy S, PA-C  Multiple Vitamin (MULTIVITAMIN WITH MINERALS) TABS tablet Take 1 tablet by mouth daily.    [provider]  nitroGLYCERIN (NITROSTAT) 0.4 MG SL tablet DISSOLVE 1 TABLET UNDER THE TONGUE EVERY 5 MINUTES AS  NEEDED FOR CHEST PAIN. MAX  OF 3 TABLETS IN 15 MINUTES. CALL 911 IF PAIN PERSISTS. Patient taking differently: Place 0.4 mg under the tongue every 5 (five) minutes as needed for chest pain. 07/25/20   [name], MD  pantoprazole (PROTONIX) 40 MG tablet TAKE ONE TABLET BY MOUTH EVERY MORNING Patient taking differently: Take 40 mg by mouth daily. 08/03/20   Esterwood, Amy S, PA-C  rosuvastatin (CRESTOR) 20 MG tablet Take 1 tablet (20 mg total) by mouth daily. 08/27/20   Dutch Quint B, FNP  traZODone (DESYREL) 50 MG tablet TAKE 1/2 TO 1 TABLET BY  MOUTH AT BEDTIME AS NEEDED  FOR SLEEP. Patient taking differently: Take 25-50 mg by mouth at bedtime as needed for sleep. 05/26/19    [name], MD  WIXELA INHUB 250-50 MCG/DOSE AEPB Inhale 1 puff into the lungs 2 (two) times daily. 04/02/20   [provider]    Allergies    Lisinopril, Other, and Tomato  Review of Systems   Review of Systems  Constitutional: Negative for fever.  Respiratory: Positive for shortness of breath.   Cardiovascular: Negative for chest pain.  All other systems reviewed and are negative.   Physical Exam Updated Vital Signs BP (!) 165/96 (BP Location: Left Arm)   Pulse 80   Temp (!) 95.7 F (35.4 C) (Temporal)   Resp (!) 21   Ht 6' (1.829 m)   Wt 92.5 kg   SpO2 100%   BMI 27.66 kg/m   Physical Exam Vitals and nursing note reviewed.  Constitutional:      General: He is not in acute distress.    Appearance: Normal appearance. He is well-developed and well-nourished. He is ill-appearing and diaphoretic.  HENT:     Head: Normocephalic and atraumatic.     Right Ear: Hearing normal.     Left Ear: Hearing normal.     Nose: Nose normal.     Mouth/Throat:     Mouth: Oropharynx is clear and moist and mucous membranes are normal.  Eyes:     Extraocular Movements: EOM normal.     Conjunctiva/sclera: Conjunctivae normal.     Pupils: Pupils are equal, round, and reactive to light.  Cardiovascular:     Rate and Rhythm: Regular rhythm.     Heart sounds: S1 normal and S2 normal. No murmur heard. No friction rub. No gallop.   Pulmonary:     Effort: Pulmonary effort is normal. No respiratory distress.     Breath sounds: Decreased breath sounds present.  Chest:     Chest wall: No tenderness.  Abdominal:     General: Bowel sounds are normal.     Palpations: Abdomen is soft. There is no hepatosplenomegaly.     Tenderness: There is no abdominal tenderness. There is no guarding or rebound. Negative signs include Murphy's sign and McBurney's sign.     Hernia: No hernia is present.  Musculoskeletal:        General: Normal range of motion.     Cervical back: Normal  range of motion and neck supple.  Skin:  General: Skin is warm and intact.     Findings: No rash.     Nails: There is no cyanosis.  Neurological:     Mental Status: He is alert and oriented to person, place, and time.     GCS: GCS eye subscore is 4. GCS verbal subscore is 5. GCS motor subscore is 6.     Cranial Nerves: No cranial nerve deficit.     Sensory: No sensory deficit.     Coordination: Coordination normal.     Deep Tendon Reflexes: Strength normal.  Psychiatric:        Mood and Affect: Mood and affect normal.        Speech: Speech normal.        Behavior: Behavior normal.        Thought Content: Thought content normal.     ED Results / Procedures / Treatments   Labs (all labs ordered are listed, but only abnormal results are displayed) Labs Reviewed  I-STAT ARTERIAL BLOOD GAS, ED - Abnormal; Notable for the following components:      Result Value   pH, Arterial 7.308 (*)    pCO2 arterial 64.8 (*)    pO2, Arterial 121 (*)    Bicarbonate 32.5 (*)    TCO2 34 (*)    Acid-Base Excess 5.0 (*)    HCT 33.0 (*)    Hemoglobin 11.2 (*)    All other components within normal limits  CBC WITH DIFFERENTIAL/PLATELET  BASIC METABOLIC PANEL  BRAIN NATRIURETIC PEPTIDE  POC SARS CORONAVIRUS 2 AG -  ED  TROPONIN I (HIGH SENSITIVITY)    EKG EKG Interpretation  Date/Time:  Sunday September 23 2020 06:33:53 EST Ventricular Rate:  83 PR Interval:    QRS Duration: 87 QT Interval:  380 QTC Calculation: 447 R Axis:   74 Text Interpretation: Sinus rhythm Prolonged PR interval Left ventricular hypertrophy Borderline T abnormalities, inferior leads No significant change since last tracing Confirmed by Orpah Greek [phone]) on 09/23/2020 6:36:24 AM   Radiology No results found.  Procedures Procedures (including critical care time)  Medications Ordered in ED Medications  albuterol (PROVENTIL,VENTOLIN) solution continuous neb (has no administration in time range)    ED  Course  I have reviewed the triage vital signs and the nursing notes.  Pertinent labs & imaging results that were available during my care of the patient were reviewed by me and considered in my medical decision making (see chart for details).    MDM Rules/Calculators/A&P                          Patient with history of COPD and CHF presents to the emergency department in respiratory distress.  He was treated aggressively for COPD exacerbation during transport and has significantly improved.  He has now weaned down to nasal cannula and is talking in full sentences.  Will continue bronchodilator therapy.  Await labs to determine if he has any volume overload.  Sign out to oncoming ED physician to follow up.  CRITICAL CARE Performed by: Orpah Greek   Total critical care time: 30 minutes  Critical care time was exclusive of separately billable procedures and treating other patients.  Critical care was necessary to treat or prevent imminent or life-threatening deterioration.  Critical care was time spent personally by me on the following activities: development of treatment plan with patient and/or surrogate as well as nursing, discussions with consultants, evaluation of patient's response to treatment,  examination of patient, obtaining history from patient or surrogate, ordering and performing treatments and interventions, ordering and review of laboratory studies, ordering and review of radiographic studies, pulse oximetry and re-evaluation of patient's condition.   Final Clinical Impression(s) / ED Diagnoses Final diagnoses:  Respiratory distress    Rx / DC Orders ED Discharge Orders    None       Fredrich Cory, Gwenyth Allegra, MD 09/23/20 [phone]

## 2020-09-24 DIAGNOSIS — J441 Chronic obstructive pulmonary disease with (acute) exacerbation: Secondary | ICD-10-CM | POA: Diagnosis not present

## 2020-09-24 LAB — CBC
HCT: 33.9 % — ABNORMAL LOW (ref 39.0–52.0)
Hemoglobin: 10.4 g/dL — ABNORMAL LOW (ref 13.0–17.0)
MCH: 28 pg (ref 26.0–34.0)
MCHC: 30.7 g/dL (ref 30.0–36.0)
MCV: 91.4 fL (ref 80.0–100.0)
Platelets: 225 10*3/uL (ref 150–400)
RBC: 3.71 MIL/uL — ABNORMAL LOW (ref 4.22–5.81)
RDW: 13.7 % (ref 11.5–15.5)
WBC: 8.9 10*3/uL (ref 4.0–10.5)
nRBC: 0 % (ref 0.0–0.2)

## 2020-09-24 LAB — BASIC METABOLIC PANEL
Anion gap: 12 (ref 5–15)
BUN: 19 mg/dL (ref 6–20)
CO2: 27 mmol/L (ref 22–32)
Calcium: 8.8 mg/dL — ABNORMAL LOW (ref 8.9–10.3)
Chloride: 98 mmol/L (ref 98–111)
Creatinine, Ser: 1.39 mg/dL — ABNORMAL HIGH (ref 0.61–1.24)
GFR, Estimated: 58 mL/min — ABNORMAL LOW (ref >60)
Glucose, Bld: 208 mg/dL — ABNORMAL HIGH (ref 70–99)
Potassium: 4.4 mmol/L (ref 3.5–5.1)
Sodium: 137 mmol/L (ref 135–145)

## 2020-09-24 LAB — GLUCOSE, CAPILLARY: Glucose-Capillary: 195 mg/dL — ABNORMAL HIGH (ref 70–99)

## 2020-09-24 LAB — HIV ANTIBODY (ROUTINE TESTING W REFLEX): HIV Screen 4th Generation wRfx: NONREACTIVE

## 2020-09-24 MED ORDER — AZITHROMYCIN 500 MG PO TABS
500.0000 mg | ORAL_TABLET | Freq: Every day | ORAL | Status: DC
  Administered 2020-09-24: 500 mg via ORAL
  Filled 2020-09-24: qty 1

## 2020-09-24 MED ORDER — FUROSEMIDE 40 MG PO TABS
40.0000 mg | ORAL_TABLET | Freq: Two times a day (BID) | ORAL | Status: DC
Start: 2020-09-24 — End: 2020-09-24
  Administered 2020-09-24: 40 mg via ORAL
  Filled 2020-09-24: qty 1

## 2020-09-24 MED ORDER — PREDNISONE 20 MG PO TABS: 40.0000 mg | ORAL_TABLET | Freq: Every day | ORAL | 0 refills | Status: AC

## 2020-09-24 MED ORDER — FUROSEMIDE 40 MG PO TABS: 40.0000 mg | ORAL_TABLET | Freq: Two times a day (BID) | ORAL | Status: DC

## 2020-09-24 MED ORDER — ACETAMINOPHEN 325 MG PO TABS
650.0000 mg | ORAL_TABLET | Freq: Four times a day (QID) | ORAL | Status: DC | PRN
  Administered 2020-09-24: 650 mg via ORAL
  Filled 2020-09-24: qty 2

## 2020-09-24 MED ORDER — GUAIFENESIN ER 600 MG PO TB12: 600.0000 mg | ORAL_TABLET | Freq: Two times a day (BID) | ORAL | 0 refills | Status: DC

## 2020-09-24 MED ORDER — AZITHROMYCIN 500 MG PO TABS: 500.0000 mg | ORAL_TABLET | Freq: Every day | ORAL | 0 refills | Status: DC

## 2020-09-24 MED ORDER — IPRATROPIUM-ALBUTEROL 0.5-2.5 (3) MG/3ML IN SOLN: 3.0000 mL | Freq: Four times a day (QID) | RESPIRATORY_TRACT | 0 refills | Status: AC | PRN

## 2020-09-24 NOTE — Care Management Obs Status (Signed)
MEDICARE OBSERVATION STATUS NOTIFICATION   Patient Details  Name: Kenneth [name] MRN: [id number] Date of Birth: 1961/01/25   Medicare Observation Status Notification Given:  Yes    Bess Kinds, RN 09/24/2020, 10:18 AM

## 2020-09-24 NOTE — Discharge Summary (Signed)
Physician Discharge Summary  Kenneth [name] HKV:[id number] DOB: [birth year] DOA: 09/23/2020  PCP: [name], MD  Admit date: 09/23/2020 Discharge date: 09/24/2020  Admitted From: Home  Disposition:  Home   Recommendations for Outpatient Follow-up:  1. Follow up with PCP in 1-2 weeks 2. Please obtain BMP/CBC in one week 3.   Home Health: none  Discharge Condition: Stable.  CODE STATUS: Full code Diet recommendation: Heart Healthy  Brief/Interim Summary:  60 year old with past medical history significant for OSA on CPAP, hypertension, hyperlipidemia, COPD on 3 L of oxygen, A. fib, diabetes, chronic diastolic heart failure who presents complaining of shortness of breath, he developed a sudden episode of shortness of breath he called 911.  After he received treatment by EMS and in the ED he felt better.  He was admitted overnight for COPD exacerbation and observation.  Patient feels better today back to baseline.  He will be discharged today  1-Acute on chronic respiratory distress associated to COPD exacerbation: Received IV Solu-Medrol, nebulizer treatment in the ED. Is feeling better back to baseline. Resume treelgy./  Discharged on prednisone for 5 days and azithromycin. He will need close follow-up with his pulmonologist.  2-Chronic combined CHF: Resume home diuretics at discharge. OSA: Continue with CPAP Hypertension: Continue with Coreg Norvasc and indoor. Hyperlipidemia: Continue with Crestor AKI stage IIIa  CKD; Creatinine baseline 1.3.  Presented with a creatinine of 1.8 Creatinine down to baseline 1.3.  Resume diuretics at discharge. A. fib: Continue with Coreg and Eliquis.   Discharge Diagnoses:  Principal Problem:   COPD with acute exacerbation ([place]) Active Problems:   Essential hypertension   Controlled type 2 diabetes mellitus without complication, without long-term current use of insulin (HCC)   Depression   PAF (paroxysmal atrial fibrillation)  (HCC)   Chronic combined systolic and diastolic heart failure (HCC)   Obstructive sleep apnea   Acute kidney injury superimposed on CKD [facility])    Discharge Instructions  Discharge Instructions    Diet - low sodium heart healthy   Complete by: As directed    Increase activity slowly   Complete by: As directed      Allergies as of 09/24/2020      Reactions   Lisinopril Swelling   Other Other (See Comments)   Lettuce : rash   Tomato Rash      Medication List    STOP taking these medications   ipratropium 0.02 % nebulizer solution Commonly known as: ATROVENT     TAKE these medications   acetaminophen 325 MG tablet Commonly known as: TYLENOL Take 650 mg by mouth every 6 (six) hours as needed for mild pain or headache.   albuterol (2.5 MG/3ML) 0.083% nebulizer solution Commonly known as: PROVENTIL INHALE 1 VIAL VIA NEBULIZER 5 TIMES DAILY AS NEEDED FOR WHEEZING OR FOR SHORTNESS OF BREATH What changed: See the new instructions.   apixaban 5 MG Tabs tablet Commonly known as: Eliquis Take 1 tablet (5 mg total) by mouth 2 (two) times daily.   ARIPiprazole 5 MG tablet Commonly known as: ABILIFY Take 5 mg by mouth daily.   azithromycin 500 MG tablet Commonly known as: ZITHROMAX Take 1 tablet (500 mg total) by mouth daily.   busPIRone 15 MG tablet Commonly known as: BUSPAR Take 1 tablet (15 mg total) by mouth 2 (two) times daily.   carvedilol 12.5 MG tablet Commonly known as: COREG Take 1 tablet (12.5 mg total) by mouth 2 (two) times daily with a meal.   Coricidin HBP  Day/Night Cold 10-20 &15-200-2 MG Misc Generic drug: DM-GG & DM-APAP-CPM Take 1 tablet by mouth every 12 (twelve) hours as needed (cold symptoms).   famotidine 20 MG tablet Commonly known as: PEPCID Take 20 mg by mouth daily.   FLUoxetine 20 MG capsule Commonly known as: PROZAC TAKE 3 CAPSULES BY MOUTH  DAILY   furosemide 40 MG tablet Commonly known as: LASIX Take 1 tablet (40 mg total) by  mouth 2 (two) times daily.   gabapentin 300 MG capsule Commonly known as: NEURONTIN TAKE ONE CAPSULE BY MOUTH THREE TIMES DAILY   guaiFENesin 600 MG 12 hr tablet Commonly known as: MUCINEX Take 1 tablet (600 mg total) by mouth 2 (two) times daily.   ipratropium-albuterol 0.5-2.5 (3) MG/3ML Soln Commonly known as: DUONEB Take 3 mLs by nebulization every 6 (six) hours as needed. What changed: reasons to take this   isosorbide mononitrate 30 MG 24 hr tablet Commonly known as: IMDUR Take 30 mg by mouth daily.   lubiprostone 8 MCG capsule Commonly known as: [name] TAKE 1 CAPSULE(8 MCG) BY MOUTH TWICE DAILY WITH A MEAL What changed:   how much to take  how to take this  when to take this  additional instructions   multivitamin with minerals Tabs tablet Take 1 tablet by mouth daily.   nitroGLYCERIN 0.4 MG SL tablet Commonly known as: NITROSTAT DISSOLVE 1 TABLET UNDER THE TONGUE EVERY 5 MINUTES AS  NEEDED FOR CHEST PAIN. MAX  OF 3 TABLETS IN 15 MINUTES. CALL 911 IF PAIN PERSISTS. What changed:   how much to take  how to take this  when to take this  reasons to take this  additional instructions   ONE TOUCH ULTRA 2 w/Device Kit Use to test blood sugars 1-2 times daily.   OneTouch Ultra test strip Generic drug: glucose blood USE TO TEST BLOOD SUGAR 1-2 TIMES DAILY   onetouch ultrasoft lancets USE TO TEST BLOOD SUGAR 1-2 TIMES DAILY   pantoprazole 40 MG tablet Commonly known as: PROTONIX TAKE ONE TABLET BY MOUTH EVERY MORNING What changed: when to take this   predniSONE 20 MG tablet Commonly known as: DELTASONE Take 2 tablets (40 mg total) by mouth daily with breakfast for 5 days.   rosuvastatin 20 MG tablet Commonly known as: Crestor Take 1 tablet (20 mg total) by mouth daily.   traZODone 50 MG tablet Commonly known as: DESYREL TAKE 1/2 TO 1 TABLET BY  MOUTH AT BEDTIME AS NEEDED  FOR SLEEP. What changed:   reasons to take this  additional  instructions   Trelegy Ellipta 100-62.5-25 MCG/INH Aepb Generic drug: Fluticasone-Umeclidin-Vilant Inhale 1 puff into the lungs daily.       Follow-up Information    [name], MD Follow up in 1 week(s).   Specialty: Family Medicine Contact information: [place] [address] [phone]        Buford Dresser, MD .   Specialty: Cardiology Contact information: [address] [place] [address] [phone]              Allergies  Allergen Reactions  . Lisinopril Swelling  . Other Other (See Comments)    Lettuce : rash  . Tomato Rash    Consultations:  none   Procedures/Studies: CT HEAD WO CONTRAST  Result Date: 09/13/2020 CLINICAL DATA:  Shortness of breath. EXAM: CT HEAD WITHOUT CONTRAST TECHNIQUE: Contiguous axial images were obtained from the base of the skull through the vertex without intravenous contrast.  COMPARISON:  None. FINDINGS: Brain: No evidence of acute infarction, hemorrhage, hydrocephalus, extra-axial collection or mass lesion/mass effect. Vascular: No hyperdense vessel or unexpected calcification. Skull: Normal. Negative for fracture or focal lesion. Sinuses/Orbits: There is mild bilateral ethmoid sinus mucosal thickening. Other: None. IMPRESSION: 1. No acute intracranial abnormality. Electronically Signed   By: Virgina Norfolk M.D.   On: 09/13/2020 01:16   CT Chest Wo Contrast  Result Date: 09/23/2020 CLINICAL DATA:  COPD exacerbation, shortness of breath EXAM: CT CHEST WITHOUT CONTRAST TECHNIQUE: Multidetector CT imaging of the chest was performed following the standard protocol without IV contrast. COMPARISON:  03/03/2020 FINDINGS: Cardiovascular: Aortic atherosclerosis. Heart is normal size. Aorta is normal caliber. Mediastinum/Nodes: No mediastinal, hilar, or axillary adenopathy. Trachea and esophagus are unremarkable. Thyroid unremarkable. Lungs/Pleura: Centrilobular and paraseptal  emphysema. Peripheral platelike scarring or atelectasis in the left lower lobe and lingula. Linear scarring at the right base in both the right middle lobe and right lower lobe. No effusions. No confluent airspace opacities. Upper Abdomen: Imaging into the upper abdomen demonstrates no acute findings. Musculoskeletal: Chest wall soft tissues are unremarkable. No acute bony abnormality. IMPRESSION: Moderate centrilobular and paraseptal emphysema. Linear bibasilar scarring or atelectasis.  No confluent opacities. Aortic Atherosclerosis (ICD10-I70.0) and Emphysema (ICD10-J43.9). Electronically Signed   By: Rolm Baptise M.D.   On: 09/23/2020 11:57   DG Chest Port 1 View  Result Date: 09/23/2020 CLINICAL DATA:  Dyspnea EXAM: PORTABLE CHEST 1 VIEW COMPARISON:  09/14/2020 chest radiograph. FINDINGS: Stable cardiomediastinal silhouette with normal heart size. No pneumothorax. No pleural effusion. No pulmonary edema. Faint hazy bibasilar lung opacities, similar to prior radiograph. IMPRESSION: Faint hazy bibasilar lung opacities, similar to prior radiograph, more suggestive of atelectasis or nonspecific fibrosis. Electronically Signed   By: Ilona Sorrel M.D.   On: 09/23/2020 07:18   DG Chest Port 1 View  Result Date: 09/14/2020 CLINICAL DATA:  COPD exacerbation. EXAM: PORTABLE CHEST 1 VIEW COMPARISON:  09/12/2020 FINDINGS: Cardiopericardial silhouette is at upper limits of normal for size. Subtle bibasilar atelectasis or infiltrate noted with interval improvement in patchy ground-glass opacity seen in the left mid lung. No pulmonary edema or dense focal airspace consolidation. The visualized bony structures of the thorax show no acute abnormality. Telemetry leads overlie the chest. IMPRESSION: Subtle persistent bibasilar atelectasis or infiltrate with improvement in the hazy ground-glass opacity seen previously in the left mid lung. Electronically Signed   By: Misty Stanley M.D.   On: 09/14/2020 09:05   DG Chest  Port 1 View  Result Date: 09/12/2020 CLINICAL DATA:  Intubated EXAM: PORTABLE CHEST 1 VIEW COMPARISON:  06/20/2020 FINDINGS: Single frontal view of the chest demonstrates endotracheal tube overlying tracheal air column tip midway between thoracic inlet and carina. Enteric catheter passes below diaphragm, tip excluded by collimation. Cardiac silhouette is unremarkable. There is bilateral interstitial prominence with multifocal bilateral ground-glass airspace disease. No effusion or pneumothorax. IMPRESSION: 1. No complication after intubation. 2. Multifocal bilateral interstitial and ground-glass opacities, which may reflect atypical infection or edema. The Electronically Signed   By: Randa Ngo M.D.   On: 09/12/2020 22:39   DG Abd Portable 1 View  Result Date: 09/12/2020 CLINICAL DATA:  Enteric catheter placement EXAM: PORTABLE ABDOMEN - 1 VIEW COMPARISON:  None. FINDINGS: Supine frontal view of the abdomen and pelvis excludes the right flank and pubic symphysis by collimation. Enteric catheter coiled over the gastric fundus. Bowel gas pattern is unremarkable. IMPRESSION: 1. Enteric catheter overlying gastric fundus. Electronically Signed   By: Legrand Como  Owens Shark M.D.   On: 09/12/2020 22:39   ECHOCARDIOGRAM COMPLETE  Result Date: 09/13/2020    ECHOCARDIOGRAM REPORT   Patient Name:   DELROY ORDWAY Date of Exam: 09/13/2020 Medical Rec #:  [id number]    Height:       72.0 in Accession #:    [id number]   Weight:       203.9 lb Date of Birth:  08-Dec-1960   BSA:          2.148 m Patient Age:    76 years     BP:           107/55 mmHg Patient Gender: M            HR:           63 bpm. Exam Location:  Inpatient Procedure: 2D Echo, Cardiac Doppler and Color Doppler Indications:    Respiratory distress  History:        Patient has prior history of Echocardiogram examinations, most                 recent 04/07/2020. CHF, COPD, Arrythmias:Atrial Fibrillation;                 Risk Factors:Current Smoker, Diabetes,  Dyslipidemia and                 Hypertension.  Sonographer:    Dustin Flock Referring Phys: [place]  1. Left ventricular ejection fraction, by estimation, is 60 to 65%. The left ventricle has normal function. The left ventricle has no regional wall motion abnormalities. There is moderate concentric left ventricular hypertrophy. Left ventricular diastolic parameters are consistent with Grade I diastolic dysfunction (impaired relaxation).  2. Right ventricular systolic function is normal. The right ventricular size is normal. There is mildly elevated pulmonary artery systolic pressure.  3. The mitral valve is normal in structure. No evidence of mitral valve regurgitation. No evidence of mitral stenosis.  4. The aortic valve is tricuspid. Aortic valve regurgitation is mild to moderate. Mild aortic valve stenosis. Aortic regurgitation PHT measures 434 msec. Aortic valve area, by VTI measures 1.38 cm. Aortic valve mean gradient measures 12.0 mmHg. Aortic valve Vmax measures 2.50 m/s.  5. The inferior vena cava is dilated in size with <50% respiratory variability, suggesting right atrial pressure of 15 mmHg. FINDINGS  Left Ventricle: Left ventricular ejection fraction, by estimation, is 60 to 65%. The left ventricle has normal function. The left ventricle has no regional wall motion abnormalities. The left ventricular internal cavity size was normal in size. There is  moderate concentric left ventricular hypertrophy. Left ventricular diastolic parameters are consistent with Grade I diastolic dysfunction (impaired relaxation). Indeterminate filling pressures. Right Ventricle: The right ventricular size is normal. No increase in right ventricular wall thickness. Right ventricular systolic function is normal. There is mildly elevated pulmonary artery systolic pressure. The tricuspid regurgitant velocity is 2.57  m/s, and with an assumed right atrial pressure of 15 mmHg, the estimated  right ventricular systolic pressure is 97.4 mmHg. Left Atrium: Left atrial size was normal in size. Right Atrium: Right atrial size was normal in size. Pericardium: There is no evidence of pericardial effusion. Mitral Valve: The mitral valve is normal in structure. No evidence of mitral valve regurgitation. No evidence of mitral valve stenosis. Tricuspid Valve: The tricuspid valve is normal in structure. Tricuspid valve regurgitation is trivial. No evidence of tricuspid stenosis. Aortic Valve: The aortic valve is tricuspid. Aortic valve regurgitation is mild  to moderate. Aortic regurgitation PHT measures 434 msec. Mild aortic stenosis is present. Aortic valve mean gradient measures 12.0 mmHg. Aortic valve peak gradient measures 25.0 mmHg. Aortic valve area, by VTI measures 1.38 cm. Pulmonic Valve: The pulmonic valve was normal in structure. Pulmonic valve regurgitation is not visualized. No evidence of pulmonic stenosis. Aorta: The aortic root is normal in size and structure. Venous: The inferior vena cava is dilated in size with less than 50% respiratory variability, suggesting right atrial pressure of 15 mmHg. IAS/Shunts: No atrial level shunt detected by color flow Doppler.  LEFT VENTRICLE PLAX 2D LVIDd:         5.10 cm  Diastology LVIDs:         3.30 cm  LV e' medial:    5.77 cm/s LV PW:         1.40 cm  LV E/e' medial:  11.4 LV IVS:        1.30 cm  LV e' lateral:   6.20 cm/s LVOT diam:     2.20 cm  LV E/e' lateral: 10.6 LV SV:         70 LV SV Index:   33 LVOT Area:     3.80 cm  RIGHT VENTRICLE RV Basal diam:  2.90 cm RV S prime:     9.14 cm/s TAPSE (M-mode): 2.0 cm LEFT ATRIUM             Index       RIGHT ATRIUM           Index LA diam:        3.40 cm 1.58 cm/m  RA Area:     14.40 cm LA Vol (A2C):   47.5 ml 22.11 ml/m RA Volume:   38.70 ml  18.01 ml/m LA Vol (A4C):   39.8 ml 18.53 ml/m LA Biplane Vol: 45.8 ml 21.32 ml/m  AORTIC VALVE AV Area (Vmax):    1.37 cm AV Area (Vmean):   1.52 cm AV Area  (VTI):     1.38 cm AV Vmax:           250.00 cm/s AV Vmean:          161.000 cm/s AV VTI:            0.510 m AV Peak Grad:      25.0 mmHg AV Mean Grad:      12.0 mmHg LVOT Vmax:         90.10 cm/s LVOT Vmean:        64.500 cm/s LVOT VTI:          0.185 m LVOT/AV VTI ratio: 0.36 AI PHT:            434 msec  AORTA Ao Root diam: 3.20 cm MITRAL VALVE               TRICUSPID VALVE MV Area (PHT): 3.12 cm    TR Peak grad:   26.4 mmHg MV Decel Time: 243 msec    TR Vmax:        257.00 cm/s MV E velocity: 65.60 cm/s MV A velocity: 62.60 cm/s  SHUNTS MV E/A ratio:  1.05        Systemic VTI:  0.18 m                            Systemic Diam: 2.20 cm Skeet Latch MD Electronically signed by Skeet Latch MD Signature Date/Time: 09/13/2020/12:05:59 PM  Final      Subjective: He is breathing better, he is back to his baseline.   Discharge Exam: Vitals:   09/24/20 0120 09/24/20 0852  BP: (!) 160/77 (!) 164/81  Pulse: 74 80  Resp:  20  Temp: 98 F (36.7 C) 98.3 F (36.8 C)  SpO2: 100% 100%     General: Pt is alert, awake, not in acute distress Cardiovascular: RRR, S1/S2 +, no rubs, no gallops Respiratory: CTA bilaterally, no wheezing, no rhonchi Abdominal: Soft, NT, ND, bowel sounds + Extremities: no edema, no cyanosis    The results of significant diagnostics from this hospitalization (including imaging, microbiology, ancillary and laboratory) are listed below for reference.     Microbiology: Recent Results (from the past 240 hour(s))  Resp Panel by RT-PCR (Flu A&B, Covid) Nasopharyngeal Swab     Status: None   Collection Time: 09/23/20  7:11 AM   Specimen: Nasopharyngeal Swab; Nasopharyngeal(NP) swabs in vial transport medium  Result Value Ref Range Status   SARS Coronavirus 2 by RT PCR NEGATIVE NEGATIVE Final    Comment: (NOTE) SARS-CoV-2 target nucleic acids are NOT DETECTED.  The SARS-CoV-2 RNA is generally detectable in upper respiratory specimens during the acute phase of  infection. The lowest concentration of SARS-CoV-2 viral copies this assay can detect is 138 copies/mL. A negative result does not preclude SARS-Cov-2 infection and should not be used as the sole basis for treatment or other patient management decisions. A negative result may occur with  improper specimen collection/handling, submission of specimen other than nasopharyngeal swab, presence of viral mutation(s) within the areas targeted by this assay, and inadequate number of viral copies(<138 copies/mL). A negative result must be combined with clinical observations, patient history, and epidemiological information. The expected result is Negative.  Fact Sheet for Patients:  EntrepreneurPulse.com.au  Fact Sheet for Healthcare Providers:  IncredibleEmployment.be  This test is no t yet approved or cleared by the [place] FDA and  has been authorized for detection and/or diagnosis of SARS-CoV-2 by FDA under an Emergency Use Authorization (EUA). This EUA will remain  in effect (meaning this test can be used) for the duration of the COVID-19 declaration under Section 564(b)(1) of the Act, 21 U.S.C.section 360bbb-3(b)(1), unless the authorization is terminated  or revoked sooner.       Influenza A by PCR NEGATIVE NEGATIVE Final   Influenza B by PCR NEGATIVE NEGATIVE Final    Comment: (NOTE) The Xpert Xpress SARS-CoV-2/FLU/RSV plus assay is intended as an aid in the diagnosis of influenza from Nasopharyngeal swab specimens and should not be used as a sole basis for treatment. Nasal washings and aspirates are unacceptable for Xpert Xpress SARS-CoV-2/FLU/RSV testing.  Fact Sheet for Patients: EntrepreneurPulse.com.au  Fact Sheet for Healthcare Providers: IncredibleEmployment.be  This test is not yet approved or cleared by the [place] FDA and has been authorized for detection and/or diagnosis of SARS-CoV-2  by FDA under an Emergency Use Authorization (EUA). This EUA will remain in effect (meaning this test can be used) for the duration of the COVID-19 declaration under Section 564(b)(1) of the Act, 21 U.S.C. section 360bbb-3(b)(1), unless the authorization is terminated or revoked.  Performed at [facility], [place] [address] [place], [place] [address]      Labs: BNP (last 3 results) Recent Labs    09/15/20 0236 09/16/20 0154 09/23/20 0634  BNP 1,221.6* 1,514.3* 3,220.2*   Basic Metabolic Panel: Recent Labs  Lab 09/23/20 5427 09/23/20 0648 09/24/20 0629  NA 138  136 137  K 4.2 4.2 4.4  CL 98  --  98  CO2 27  --  27  GLUCOSE 204*  --  208*  BUN 14  --  19  CREATININE 1.89*  --  1.39*  CALCIUM 8.5*  --  8.8*   Liver Function Tests: No results for input(s): AST, ALT, ALKPHOS, BILITOT, PROT, ALBUMIN in the last 168 hours. No results for input(s): LIPASE, AMYLASE in the last 168 hours. No results for input(s): AMMONIA in the last 168 hours. CBC: Recent Labs  Lab 09/23/20 0634 09/23/20 0648 09/24/20 0629  WBC 14.6*  --  8.9  NEUTROABS 11.8*  --   --   HGB 11.0* 11.2* 10.4*  HCT 35.1* 33.0* 33.9*  MCV 93.4  --  91.4  PLT 260  --  225   Cardiac Enzymes: No results for input(s): CKTOTAL, CKMB, CKMBINDEX, TROPONINI in the last 168 hours. BNP: Invalid input(s): POCBNP CBG: Recent Labs  Lab 09/23/20 1206 09/23/20 1400 09/23/20 1816 09/23/20 2125 09/24/20 0634  GLUCAP 168* 238* 166* 182* 195*   D-Dimer No results for input(s): DDIMER in the last 72 hours. Hgb A1c No results for input(s): HGBA1C in the last 72 hours. Lipid Profile No results for input(s): CHOL, HDL, LDLCALC, TRIG, CHOLHDL, LDLDIRECT in the last 72 hours. Thyroid function studies No results for input(s): TSH, T4TOTAL, T3FREE, THYROIDAB in the last 72 hours.  Invalid input(s): FREET3 Anemia work up No results for input(s): VITAMINB12, FOLATE, FERRITIN, TIBC, IRON, RETICCTPCT in the last  72 hours. Urinalysis    Component Value Date/Time   COLORURINE YELLOW 05/20/2019 0825   APPEARANCEUR CLEAR 05/20/2019 0825   LABSPEC 1.020 05/20/2019 0825   PHURINE 6.0 05/20/2019 0825   GLUCOSEU NEGATIVE 05/20/2019 0825   HGBUR NEGATIVE 05/20/2019 0825   BILIRUBINUR SMALL (A) 05/20/2019 0825   KETONESUR TRACE (A) 05/20/2019 0825   UROBILINOGEN 1.0 05/20/2019 0825   NITRITE NEGATIVE 05/20/2019 0825   LEUKOCYTESUR NEGATIVE 05/20/2019 0825   Sepsis Labs Invalid input(s): PROCALCITONIN,  WBC,  LACTICIDVEN Microbiology Recent Results (from the past 240 hour(s))  Resp Panel by RT-PCR (Flu A&B, Covid) Nasopharyngeal Swab     Status: None   Collection Time: 09/23/20  7:11 AM   Specimen: Nasopharyngeal Swab; Nasopharyngeal(NP) swabs in vial transport medium  Result Value Ref Range Status   SARS Coronavirus 2 by RT PCR NEGATIVE NEGATIVE Final    Comment: (NOTE) SARS-CoV-2 target nucleic acids are NOT DETECTED.  The SARS-CoV-2 RNA is generally detectable in upper respiratory specimens during the acute phase of infection. The lowest concentration of SARS-CoV-2 viral copies this assay can detect is 138 copies/mL. A negative result does not preclude SARS-Cov-2 infection and should not be used as the sole basis for treatment or other patient management decisions. A negative result may occur with  improper specimen collection/handling, submission of specimen other than nasopharyngeal swab, presence of viral mutation(s) within the areas targeted by this assay, and inadequate number of viral copies(<138 copies/mL). A negative result must be combined with clinical observations, patient history, and epidemiological information. The expected result is Negative.  Fact Sheet for Patients:  EntrepreneurPulse.com.au  Fact Sheet for Healthcare Providers:  IncredibleEmployment.be  This test is no t yet approved or cleared by the [place] FDA and  has been  authorized for detection and/or diagnosis of SARS-CoV-2 by FDA under an Emergency Use Authorization (EUA). This EUA will remain  in effect (meaning this test can be used) for the duration of the  COVID-19 declaration under Section 564(b)(1) of the Act, 21 U.S.C.section 360bbb-3(b)(1), unless the authorization is terminated  or revoked sooner.       Influenza A by PCR NEGATIVE NEGATIVE Final   Influenza B by PCR NEGATIVE NEGATIVE Final    Comment: (NOTE) The Xpert Xpress SARS-CoV-2/FLU/RSV plus assay is intended as an aid in the diagnosis of influenza from Nasopharyngeal swab specimens and should not be used as a sole basis for treatment. Nasal washings and aspirates are unacceptable for Xpert Xpress SARS-CoV-2/FLU/RSV testing.  Fact Sheet for Patients: EntrepreneurPulse.com.au  Fact Sheet for Healthcare Providers: IncredibleEmployment.be  This test is not yet approved or cleared by the [place] FDA and has been authorized for detection and/or diagnosis of SARS-CoV-2 by FDA under an Emergency Use Authorization (EUA). This EUA will remain in effect (meaning this test can be used) for the duration of the COVID-19 declaration under Section 564(b)(1) of the Act, 21 U.S.C. section 360bbb-3(b)(1), unless the authorization is terminated or revoked.  Performed at [facility], [facility] [address], [place], [place] [address]      Time coordinating discharge: 40 minutes  SIGNED:   Elmarie Shiley, MD  [facility]

## 2020-09-24 NOTE — Progress Notes (Signed)
SATURATION QUALIFICATIONS: (This note is used to comply with regulatory documentation for home oxygen)  Patient Saturations on Room Air at Rest = 97%  Patient Saturations on Room Air while Ambulating = 94%  Patient Saturations on 3 Liters of oxygen while Ambulating = 100%

## 2020-09-24 NOTE — Progress Notes (Signed)
DISCHARGE NOTE HOME Kenneth [name] to be discharged Home per MD order. Discussed prescriptions and follow up appointments with the patient. Medication list explained in detail. Patient verbalized understanding.  Skin clean, dry and intact without evidence of skin break down, no evidence of skin tears noted. IV catheter discontinued intact. Site without signs and symptoms of complications. Dressing and pressure applied. Pt denies pain at the site currently. No complaints noted.  Patient free of lines, drains, and wounds.   An After Visit Summary (AVS) was printed and given to the patient. Patient escorted via wheelchair, and discharged home via private auto.  [name], RN

## 2020-09-24 NOTE — TOC Transition Note (Signed)
Transition of Care [facility]) - CM/SW Discharge Note   Patient Details  Name: Kenneth [name] MRN: [id number] Date of Birth: [birth year]  [facility] CM/SW Contact:  Bess Kinds, RN Phone Number: [phone] 09/24/2020, 10:19 AM   Clinical Narrative:     Spoke with patient at the bedside. Demographics updated for current phone number. Verified PCP and preferred pharmacy in Epic as correct. Patient stated that he has all needed medications. Patient stated that he has a PCP appointment scheduled for tomorrow. Patient stated that he has transportation home. No further TOC needs identified.   Final next level of care: Home/Self Care Barriers to Discharge: No Barriers Identified   Patient Goals and CMS Choice Patient states their goals for this hospitalization and ongoing recovery are:: return home CMS Medicare.gov Compare Post Acute Care list provided to:: Patient Choice offered to / list presented to : NA  Discharge Placement                       Discharge Plan and Services                DME Arranged: N/A DME Agency: NA       HH Arranged: NA HH Agency: NA        Social Determinants of Health (SDOH) Interventions     Readmission Risk Interventions No flowsheet data found.

## 2020-09-24 NOTE — Progress Notes (Signed)
New Admission Note:   Arrival Method: from ED via stretcher Mental Orientation: Alert and oriented x4 Telemetry: 16m10, CCMD notified Assessment: Completed Skin: Intact IV: RAC, infusing with LR @ 75cc/hr Pain: 0/10 Tubes: None Safety Measures: Safety Fall Prevention Plan has been discussed  Admission: to be completed 5 [place] Orientation: Patient has been oriented to the room, unit and staff.   Family: none ate bedside  Orders to be reviewed and implemented. Will continue to monitor the patient. Call light has been placed within reach and bed alarm has been activated.

## 2020-09-25 ENCOUNTER — Telehealth: Payer: Self-pay

## 2020-09-25 ENCOUNTER — Encounter: Payer: Self-pay | Admitting: Family

## 2020-09-25 ENCOUNTER — Other Ambulatory Visit: Payer: Self-pay

## 2020-09-25 ENCOUNTER — Ambulatory Visit (INDEPENDENT_AMBULATORY_CARE_PROVIDER_SITE_OTHER): Payer: Medicare Other | Admitting: Family

## 2020-09-25 VITALS — BP 142/70 | HR 70 | Temp 97.4°F | Ht 72.0 in | Wt 203.6 lb

## 2020-09-25 DIAGNOSIS — I480 Paroxysmal atrial fibrillation: Secondary | ICD-10-CM

## 2020-09-25 DIAGNOSIS — E1140 Type 2 diabetes mellitus with diabetic neuropathy, unspecified: Secondary | ICD-10-CM

## 2020-09-25 DIAGNOSIS — Z09 Encounter for follow-up examination after completed treatment for conditions other than malignant neoplasm: Secondary | ICD-10-CM | POA: Diagnosis not present

## 2020-09-25 DIAGNOSIS — F5105 Insomnia due to other mental disorder: Secondary | ICD-10-CM

## 2020-09-25 DIAGNOSIS — F99 Mental disorder, not otherwise specified: Secondary | ICD-10-CM

## 2020-09-25 DIAGNOSIS — E119 Type 2 diabetes mellitus without complications: Secondary | ICD-10-CM | POA: Diagnosis not present

## 2020-09-25 DIAGNOSIS — J449 Chronic obstructive pulmonary disease, unspecified: Secondary | ICD-10-CM

## 2020-09-25 DIAGNOSIS — Z9981 Dependence on supplemental oxygen: Secondary | ICD-10-CM | POA: Diagnosis not present

## 2020-09-25 DIAGNOSIS — I10 Essential (primary) hypertension: Secondary | ICD-10-CM

## 2020-09-25 DIAGNOSIS — N289 Disorder of kidney and ureter, unspecified: Secondary | ICD-10-CM

## 2020-09-25 LAB — CBC WITH DIFFERENTIAL/PLATELET
Basophils Absolute: 0.2 10*3/uL — ABNORMAL HIGH (ref 0.0–0.1)
Basophils Relative: 1.7 % (ref 0.0–3.0)
Eosinophils Absolute: 0 10*3/uL (ref 0.0–0.7)
Eosinophils Relative: 0.3 % (ref 0.0–5.0)
HCT: 33.4 % — ABNORMAL LOW (ref 39.0–52.0)
Hemoglobin: 10.7 g/dL — ABNORMAL LOW (ref 13.0–17.0)
Lymphocytes Relative: 17.1 % (ref 12.0–46.0)
Lymphs Abs: 1.8 10*3/uL (ref 0.7–4.0)
MCHC: 32.1 g/dL (ref 30.0–36.0)
MCV: 88.2 fl (ref 78.0–100.0)
Monocytes Absolute: 1 10*3/uL (ref 0.1–1.0)
Monocytes Relative: 9.3 % (ref 3.0–12.0)
Neutro Abs: 7.3 10*3/uL (ref 1.4–7.7)
Neutrophils Relative %: 71.6 % (ref 43.0–77.0)
Platelets: 235 10*3/uL (ref 150.0–400.0)
RBC: 3.79 Mil/uL — ABNORMAL LOW (ref 4.22–5.81)
RDW: 15.3 % (ref 11.5–15.5)
WBC: 10.3 10*3/uL (ref 4.0–10.5)

## 2020-09-25 LAB — BASIC METABOLIC PANEL
BUN: 17 mg/dL (ref 6–23)
CO2: 36 mEq/L — ABNORMAL HIGH (ref 19–32)
Calcium: 9 mg/dL (ref 8.4–10.5)
Chloride: 99 mEq/L (ref 96–112)
Creatinine, Ser: 1.67 mg/dL — ABNORMAL HIGH (ref 0.40–1.50)
GFR: 44.62 mL/min — ABNORMAL LOW (ref >60.00)
Glucose, Bld: 149 mg/dL — ABNORMAL HIGH (ref 70–99)
Potassium: 4.2 mEq/L (ref 3.5–5.1)
Sodium: 141 mEq/L (ref 135–145)

## 2020-09-25 NOTE — Patient Instructions (Signed)

## 2020-09-25 NOTE — Progress Notes (Addendum)
Chronic Care Management Pharmacy Assistant   Name: Kenneth [name]  MRN: [id number] DOB: July 31, 1961  Reason for Encounter: Medication Review   PCP : [name], MD  Allergies:   Allergies  Allergen Reactions   Lisinopril Swelling   Other Other (See Comments)    Lettuce : rash   Tomato Rash    Medications: Outpatient Encounter Medications as of 09/25/2020  Medication Sig   acetaminophen (TYLENOL) 325 MG tablet Take 650 mg by mouth every 6 (six) hours as needed for mild pain or headache.   albuterol (PROVENTIL) (2.5 MG/3ML) 0.083% nebulizer solution INHALE 1 VIAL VIA NEBULIZER 5 TIMES DAILY AS NEEDED FOR WHEEZING OR FOR SHORTNESS OF BREATH (Patient taking differently: Take 2.5 mg by nebulization See admin instructions. 5 times daily prn wheezing or shortness of breath)   apixaban (ELIQUIS) 5 MG TABS tablet Take 1 tablet (5 mg total) by mouth 2 (two) times daily.   ARIPiprazole (ABILIFY) 5 MG tablet Take 5 mg by mouth daily.   azithromycin (ZITHROMAX) 500 MG tablet Take 1 tablet (500 mg total) by mouth daily.   Blood Glucose Monitoring Suppl (ONE TOUCH ULTRA 2) w/Device KIT Use to test blood sugars 1-2 times daily.   busPIRone (BUSPAR) 15 MG tablet Take 1 tablet (15 mg total) by mouth 2 (two) times daily.   carvedilol (COREG) 12.5 MG tablet Take 1 tablet (12.5 mg total) by mouth 2 (two) times daily with a meal.   DM-GG & DM-APAP-CPM (CORICIDIN HBP DAY/NIGHT COLD) 10-20 &15-200-2 MG MISC Take 1 tablet by mouth every 12 (twelve) hours as needed (cold symptoms).   famotidine (PEPCID) 20 MG tablet Take 20 mg by mouth daily.   FLUoxetine (PROZAC) 20 MG capsule TAKE 3 CAPSULES BY MOUTH  DAILY (Patient taking differently: Take 60 mg by mouth daily.)   Fluticasone-Umeclidin-Vilant (TRELEGY ELLIPTA) 100-62.5-25 MCG/INH AEPB Inhale 1 puff into the lungs daily.   furosemide (LASIX) 40 MG tablet Take 1 tablet (40 mg total) by mouth 2 (two) times daily.   gabapentin (NEURONTIN) 300 MG  capsule TAKE ONE CAPSULE BY MOUTH THREE TIMES DAILY (Patient taking differently: Take 300 mg by mouth 3 (three) times daily.)   glucose blood (ONETOUCH ULTRA) test strip USE TO TEST BLOOD SUGAR 1-2 TIMES DAILY   guaiFENesin (MUCINEX) 600 MG 12 hr tablet Take 1 tablet (600 mg total) by mouth 2 (two) times daily.   ipratropium-albuterol (DUONEB) 0.5-2.5 (3) MG/3ML SOLN Take 3 mLs by nebulization every 6 (six) hours as needed.   isosorbide mononitrate (IMDUR) 30 MG 24 hr tablet Take 30 mg by mouth daily.   Lancets (ONETOUCH ULTRASOFT) lancets USE TO TEST BLOOD SUGAR 1-2 TIMES DAILY   lubiprostone ([name]) 8 MCG capsule TAKE 1 CAPSULE(8 MCG) BY MOUTH TWICE DAILY WITH A MEAL (Patient taking differently: Take 8 mcg by mouth 2 (two) times daily with a meal.)   Multiple Vitamin (MULTIVITAMIN WITH MINERALS) TABS tablet Take 1 tablet by mouth daily.   nitroGLYCERIN (NITROSTAT) 0.4 MG SL tablet DISSOLVE 1 TABLET UNDER THE TONGUE EVERY 5 MINUTES AS&nbsp;&nbsp;NEEDED FOR CHEST PAIN. MAX&nbsp;&nbsp;OF 3 TABLETS IN 15 MINUTES. CALL 911 IF PAIN PERSISTS. (Patient taking differently: Place 0.4 mg under the tongue every 5 (five) minutes as needed for chest pain.)   pantoprazole (PROTONIX) 40 MG tablet TAKE ONE TABLET BY MOUTH EVERY MORNING (Patient taking differently: Take 40 mg by mouth daily.)   predniSONE (DELTASONE) 20 MG tablet Take 2 tablets (40 mg total) by mouth daily with breakfast  for 5 days.   rosuvastatin (CRESTOR) 20 MG tablet Take 1 tablet (20 mg total) by mouth daily.   traZODone (DESYREL) 50 MG tablet TAKE 1/2 TO 1 TABLET BY  MOUTH AT BEDTIME AS NEEDED  FOR SLEEP. (Patient taking differently: Take 25-50 mg by mouth at bedtime as needed for sleep.)   No facility-administered encounter medications on file as of 09/25/2020.    Current Diagnosis: Patient Active Problem List   Diagnosis Date Noted   COPD with acute exacerbation ([place]) 09/23/2020   Acute kidney injury superimposed on CKD ([place]) 09/23/2020    Acute respiratory failure ([place]) 09/13/2020   Thiamine deficiency 07/02/2020   Acute respiratory distress 06/21/2020   Chronic respiratory failure with hypoxia ([place]) 06/01/2020   Obstructive sleep apnea 05/02/2020   Type 2 diabetes mellitus with diabetic neuropathy, without long-term current use of insulin ([place]) 04/24/2020   Hyperlipidemia    Acute on chronic combined systolic and diastolic CHF (congestive heart failure) ([place]) 04/07/2020   Chronic constipation 07/19/2019   Hospital discharge follow-up 06/17/2019   Shortness of breath 06/12/2019   Chest tightness 06/12/2019   Snoring 03/29/2019   Insomnia due to other mental disorder 11/15/2018   Anticoagulant long-term use 10/21/2018   Gastroesophageal reflux disease without esophagitis 08/10/2018   Tobacco abuse 08/10/2018   Chest pain 07/27/2018   PAF (paroxysmal atrial fibrillation) ([place]) 07/27/2018   Mild renal insufficiency 07/27/2018   Chronic combined systolic and diastolic heart failure ([place]) 07/27/2018   Slow transit constipation 07/20/2018   Essential hypertension 07/06/2018   Chronic obstructive pulmonary disease ([place]) 07/06/2018   Controlled type 2 diabetes mellitus without complication, without long-term current use of insulin ([place]) 07/06/2018   Depression 07/06/2018   Macrocytic anemia 07/06/2018   Healthcare maintenance 07/06/2018   Alcohol abuse 07/06/2018    Goals Addressed   None    Reviewed chart for medication changes ahead of medication coordination call.  OVs, Consults, or hospital visits since last care coordination call/Pharmacist visit  09/05/2020 Cardiology  Bridgette Harrell Gave 09/05/2020 Pulmonology Tammy S Parrett 09/12/2020 ED COPD exacerbation 09/23/2020 ED Respiratory distress   No medication changes indicated.  BP Readings from Last 3 Encounters:  09/24/20 (!) 164/81  09/16/20 (!) 133/93  09/05/20 (!) 146/76    Lab Results  Component Value Date   HGBA1C 6.3 (H) 09/13/2020      Patient obtains medications through Vials  90 Days   Last adherence delivery included:     None ID  Coordinated acute fill for (med) to be delivered (date).              Aripiprazole 5 mg Daily Breakfast to be delivered 08/27/2020   Patient declined the following medications:             Isosorbide Mononitrate 30 MG Tablet Daily  -Breakfast -Adequate supply              Carvedilol 12.5 MG Twice Daily  - Breakfast,Evening Meals -Adequate supply              Amlodipine 5MG Tablet Daily  -Breakfast -Adequate supply              Furosemide 40 Mg Tablet Twice Daily - Breakfast, Evening Meals -Adequate supply              Lubiprostone 8 MCG Capsule Twice Daily - Breakfast, Evening Meals -Adequate supply              Eliquis 5 MG Tablets twice daily -Breakfast,  Evening Meals -Adequate supply              Trelegy Ellipta 100-62.5-25 One  Puff Daily -Adequate supply               Pantoprazole 40 MG Tablet Daily -Breakfast -Adequate supply              Gabapentin 300 MG Capsule Three Times Daily - Breakfast, Lunch , Evening meals -Adequate supply               Albuterol 108 MCG/ACT inhaler PRN - adequate supply              Albuterol 0.83% 25CT SOLN PRN  - Patient states he will get this from a company.             Buspirone 15 MG Tablet Two Times Daily - Breakfast, Evening meal- adequate supply             Nitroglycerin 0.4MG SL Tablet PRN -Adequate supply             Metformin HCI 500 MG Tablet Twice Daily - Breakfast, Evening Meal -adequate supply.             One Touch U-Soft Lancet 100 -Adequate supply             One touch Ult 100CT Test strip - Adequate Supply              Losartan 50MG Tablet Daily              Fluoxetine 20 MG Capsule Three Capsules Daily   Patient is due for next adherence delivery on: 10/01/2020. Called patient and reviewed medications and coordinated delivery.   This delivery to include:None ID  Patient will not  need a short fill of medication, prior to  adherence delivery.  Patient will not need a  acute fill of medication, prior to adherence delivery.   Patient declined the following medications:   Isosorbide Mononitrate 30 MG Tablet Daily  -Breakfast -Adequate supply              Carvedilol 12.5 MG Twice Daily  - Breakfast,Evening Meals -Adequate supply              Amlodipine 5MG Tablet Daily  -Breakfast -Adequate supply              Furosemide 40 Mg Tablet Twice Daily - Breakfast, Evening Meals -Adequate supply              Lubiprostone 8 MCG Capsule Twice Daily - Breakfast, Evening Meals -Adequate supply              Eliquis 5 MG Tablets twice daily -Breakfast, Evening Meals -Adequate supply              Trelegy Ellipta 100-62.5-25 One  Puff Daily -Adequate supply               Pantoprazole 40 MG Tablet Daily -Breakfast -Adequate supply              Gabapentin 300 MG Capsule Three Times Daily - Breakfast, Lunch , Evening meals -Adequate supply               Albuterol 108 MCG/ACT inhaler PRN - adequate supply              Albuterol 0.83% 25CT SOLN PRN  - Patient states he will get this from a company.  Buspirone 15 MG Tablet Two Times Daily - Breakfast, Evening meal- adequate supply             Nitroglycerin 0.4MG SL Tablet PRN -Adequate supply             Metformin HCI 500 MG Tablet Twice Daily - Breakfast, Evening Meal -adequate supply.(Patient states the ED told him to stop taking for now)             One Touch U-Soft Lancet 100 -Adequate supply             One touch Ult 100CT Test strip - Adequate Supply              Losartan 50MG Tablet Daily - Adequate supply              Fluoxetine 20 MG Capsule Three Capsules Daily- Adequate Supply     Patient needs refills for None ID .  Confirmed delivery date of No Fill Needed , advised patient that pharmacy will contact them the morning of delivery.  Blood sugar readings: Patient states his blood sugar has been ranging around 95-220's Blood pressure readings: Patient  states his blood pressure has been ranging around 160-180/60's.   CAT ASSESSMENT  Rank each of the following items on a scale of 0 to 5 (with 5 being most severe) Write a # 0-5 in each box  I never cough (0) > I cough all the time (5) 3  I have no phlegm (mucus) in my chest (0) > My chest is completely full of phlegm (mucus) (5) 1  My chest does not feel tight at all (0) > My chest feels very tight (5) 3  When I walk up a hill or one flight of stairs I am not breathless (0) > When I walk up a hill or one flight of stairs I am very breathless (5) 4  I am not limited doing any activities at home (0) > I am very limited doing activities at home (5) 1  I am confident leaving my home despite my lung function (0) > I am not at all confident leaving my home because of my lung condition (5)  4  I sleep soundly (0) > I don't sleep soundly because of my lung condition (5) 0  I have lots of energy (0) > I have no energy at all (5) 3   Total CAT Score: 19  Follow-Up:  Pharmacist Review   [place] Pharmacist Assistant [phone]   Addendum: Patient blood pressures elevated, patient has hospital follow-up today with Dutch Quint, FNP. Will likely need to restart BP meds after rechecking labwork.   15 minutes spent in review, coordination, and documentation. Doristine Section Clinical Pharmacist [facility]  [phone]

## 2020-09-25 NOTE — Progress Notes (Signed)
Established Patient Office Visit  Subjective:  Patient ID: Kenneth [name], male    DOB: Dec 25, 1960  Age: 60 y.o. MRN: [id number]  CC:  Chief Complaint  Patient presents with  . Hospitalization Follow-up    Hospital follow up seen for chest pains and SOB, no concerns.     HPI Kenneth [name] presents for hospital follow-up after being seen in the ED on Sep 23, 2020. Patient presented with concerns of chest pain and SOB. He has a history of COPD, CHF, atrial Fib, Renal insufficiency. Today he is much better. He is on 2-3 liters of o2 continuously. He is concerned that he is getting weak and needs strengthening exercises. He has an appointment with cardiology and  Pulmonology later this month. Also reports an appointment with the dentist for teet extraction. He is due to undergo general anesthesia according to him. Patient needs a refill on medication. Tolerates well.   Past Medical History:  Diagnosis Date  . Anxiety   . Arthritis   . Atrial fibrillation ([place])   . CHF (congestive heart failure) ([place])   . COPD (chronic obstructive pulmonary disease) ([facility])    emphysema  . Depression   . Diabetes mellitus without complication ([place])   . Heart murmur   . Hyperlipidemia   . Hypertension   . Sleep apnea with use of continuous positive airway pressure (CPAP)     Past Surgical History:  Procedure Laterality Date  . NO PAST SURGERIES      Family History  Problem Relation Age of Onset  . Diabetes Mother   . Heart disease Mother   . Diabetes Father   . Heart disease Father   . Diabetes Sister   . Heart disease Sister   . Kidney disease Sister   . Coronary artery disease Brother   . Liver disease Maternal Uncle     Social History   Socioeconomic History  . Marital status: Divorced    Spouse name: Not on file  . Number of children: 1  . Years of education: Not on file  . Highest education level: Not on file  Occupational History  . Occupation: disabled  Tobacco Use  . Smoking  status: Former Smoker    Packs/day: 2.00    Years: 30.00    Pack years: 60.00    Types: Cigarettes    Quit date: 05/01/2019    Years since quitting: 1.4  . Smokeless tobacco: Never Used  . Tobacco comment: smokes 2-3cigs per day  Vaping Use  . Vaping Use: Never used  Substance and Sexual Activity  . Alcohol use: Yes    Comment: Drinks up to six beers around a game  . Drug use: Yes    Types: Marijuana    Comment: occ marijuana  . Sexual activity: Yes    Partners: Female  Other Topics Concern  . Not on file  Social History Narrative  . Not on file   Social Determinants of Health   Financial Resource Strain: Low Risk   . Difficulty of Paying Living Expenses: Not hard at all  Food Insecurity: No Food Insecurity  . Worried About Charity fundraiser in the Last Year: Never true  . Ran Out of Food in the Last Year: Never true  Transportation Needs: No Transportation Needs  . Lack of Transportation (Medical): No  . Lack of Transportation (Non-Medical): No  Physical Activity: Not on file  Stress: Not on file  Social Connections: Not on file  Intimate Partner Violence:  Not on file    Outpatient Medications Prior to Visit  Medication Sig Dispense Refill  . acetaminophen (TYLENOL) 325 MG tablet Take 650 mg by mouth every 6 (six) hours as needed for mild pain or headache.    [name] apixaban (ELIQUIS) 5 MG TABS tablet Take 1 tablet (5 mg total) by mouth 2 (two) times daily. 180 tablet 1  . ARIPiprazole (ABILIFY) 5 MG tablet Take 5 mg by mouth daily.    [name] azithromycin (ZITHROMAX) 500 MG tablet Take 1 tablet (500 mg total) by mouth daily. 4 tablet 0  . Blood Glucose Monitoring Suppl (ONE TOUCH ULTRA 2) w/Device KIT Use to test blood sugars 1-2 times daily. 1 kit 0  . busPIRone (BUSPAR) 15 MG tablet Take 1 tablet (15 mg total) by mouth 2 (two) times daily. 180 tablet 1  . carvedilol (COREG) 12.5 MG tablet Take 1 tablet (12.5 mg total) by mouth 2 (two) times daily with a meal. 180 tablet 3  .  DM-GG & DM-APAP-CPM (CORICIDIN HBP DAY/NIGHT COLD) 10-20 &15-200-2 MG MISC Take 1 tablet by mouth every 12 (twelve) hours as needed (cold symptoms).    . famotidine (PEPCID) 20 MG tablet Take 20 mg by mouth daily.    [name] FLUoxetine (PROZAC) 20 MG capsule TAKE 3 CAPSULES BY MOUTH  DAILY (Patient taking differently: Take 60 mg by mouth daily.) 270 capsule 3  . Fluticasone-Umeclidin-Vilant (TRELEGY ELLIPTA) 100-62.5-25 MCG/INH AEPB Inhale 1 puff into the lungs daily. 60 each 5  . furosemide (LASIX) 40 MG tablet Take 1 tablet (40 mg total) by mouth 2 (two) times daily. 60 tablet 11  . gabapentin (NEURONTIN) 300 MG capsule TAKE ONE CAPSULE BY MOUTH THREE TIMES DAILY (Patient taking differently: Take 300 mg by mouth 3 (three) times daily.) 90 capsule 2  . glucose blood (ONETOUCH ULTRA) test strip USE TO TEST BLOOD SUGAR 1-2 TIMES DAILY 100 each 11  . guaiFENesin (MUCINEX) 600 MG 12 hr tablet Take 1 tablet (600 mg total) by mouth 2 (two) times daily. 30 tablet 0  . ipratropium-albuterol (DUONEB) 0.5-2.5 (3) MG/3ML SOLN Take 3 mLs by nebulization every 6 (six) hours as needed. 360 mL 0  . isosorbide mononitrate (IMDUR) 30 MG 24 hr tablet Take 30 mg by mouth daily.    . Lancets (ONETOUCH ULTRASOFT) lancets USE TO TEST BLOOD SUGAR 1-2 TIMES DAILY 100 each 11  . lubiprostone ([name]) 8 MCG capsule TAKE 1 CAPSULE(8 MCG) BY MOUTH TWICE DAILY WITH A MEAL (Patient taking differently: Take 8 mcg by mouth 2 (two) times daily with a meal.) 60 capsule 8  . Multiple Vitamin (MULTIVITAMIN WITH MINERALS) TABS tablet Take 1 tablet by mouth daily.    . nitroGLYCERIN (NITROSTAT) 0.4 MG SL tablet DISSOLVE 1 TABLET UNDER THE TONGUE EVERY 5 MINUTES AS&nbsp;&nbsp;NEEDED FOR CHEST PAIN. MAX&nbsp;&nbsp;OF 3 TABLETS IN 15 MINUTES. CALL 911 IF PAIN PERSISTS. (Patient taking differently: Place 0.4 mg under the tongue every 5 (five) minutes as needed for chest pain.) 75 tablet 4  . pantoprazole (PROTONIX) 40 MG tablet TAKE ONE TABLET BY  MOUTH EVERY MORNING (Patient taking differently: Take 40 mg by mouth daily.) 30 tablet 4  . predniSONE (DELTASONE) 20 MG tablet Take 2 tablets (40 mg total) by mouth daily with breakfast for 5 days. 10 tablet 0  . rosuvastatin (CRESTOR) 20 MG tablet Take 1 tablet (20 mg total) by mouth daily. 30 tablet 3  . traZODone (DESYREL) 50 MG tablet TAKE 1/2 TO 1 TABLET BY  MOUTH AT  BEDTIME AS NEEDED  FOR SLEEP. (Patient taking differently: Take 25-50 mg by mouth at bedtime as needed for sleep.) 30 tablet 11  . albuterol (PROVENTIL) (2.5 MG/3ML) 0.083% nebulizer solution INHALE 1 VIAL VIA NEBULIZER 5 TIMES DAILY AS NEEDED FOR WHEEZING OR FOR SHORTNESS OF BREATH (Patient taking differently: Take 2.5 mg by nebulization See admin instructions. 5 times daily prn wheezing or shortness of breath) 150 mL 1   No facility-administered medications prior to visit.    Allergies  Allergen Reactions  . Lisinopril Swelling  . Other Other (See Comments)    Lettuce : rash  . Tomato Rash    ROS Review of Systems    Objective:    Physical Exam  BP (!) 142/70   Pulse 70   Temp (!) 97.4 F (36.3 C) (Tympanic)   Ht 6' (1.829 m)   Wt 203 lb 9.6 oz (92.4 kg)   SpO2 97%   BMI 27.61 kg/m  Wt Readings from Last 3 Encounters:  09/25/20 203 lb 9.6 oz (92.4 kg)  09/23/20 201 lb 11.5 oz (91.5 kg)  09/13/20 203 lb 14.8 oz (92.5 kg)     Health Maintenance Due  Topic Date Due  . URINE MICROALBUMIN  07/07/2019  . COLONOSCOPY (Pts 45-75yr Insurance coverage will need to be confirmed)  09/23/2019  . OPHTHALMOLOGY EXAM  05/12/2020  . COVID-19 Vaccine (3 - Booster for Pfizer series) 07/17/2020    There are no preventive care reminders to display for this patient.  Lab Results  Component Value Date   TSH 4.32 07/06/2018   Lab Results  Component Value Date   WBC 8.9 09/24/2020   HGB 10.4 (L) 09/24/2020   HCT 33.9 (L) 09/24/2020   MCV 91.4 09/24/2020   PLT 225 09/24/2020   Lab Results  Component Value  Date   NA 137 09/24/2020   K 4.4 09/24/2020   CO2 27 09/24/2020   GLUCOSE 208 (H) 09/24/2020   BUN 19 09/24/2020   CREATININE 1.39 (H) 09/24/2020   BILITOT 0.7 09/12/2020   ALKPHOS 60 09/12/2020   AST 26 09/12/2020   ALT 18 09/12/2020   PROT 6.3 (L) 09/12/2020   ALBUMIN 3.6 09/12/2020   CALCIUM 8.8 (L) 09/24/2020   ANIONGAP 12 09/24/2020   GFR 88.57 02/14/2020   Lab Results  Component Value Date   CHOL 226 (H) 07/06/2018   Lab Results  Component Value Date   HDL 68.40 07/06/2018   Lab Results  Component Value Date   LDLCALC 119 (H) 07/06/2018   Lab Results  Component Value Date   TRIG 193 (H) 09/15/2020   Lab Results  Component Value Date   CHOLHDL 3 07/06/2018   Lab Results  Component Value Date   HGBA1C 6.3 (H) 09/13/2020      Assessment & Plan:   Problem List Items Addressed This Visit    PAF (paroxysmal atrial fibrillation) ([place]   Relevant Orders   CBC w/Diff   Basic Metabolic Panel (BMET)   Mild renal insufficiency   Relevant Orders   CBC w/Diff   Basic Metabolic Panel (BMET)   Hospital discharge follow-up   Relevant Orders   CBC w/Diff   Basic Metabolic Panel (BMET)   Essential hypertension   Relevant Orders   CBC w/Diff   Basic Metabolic Panel (BMET)   Controlled type 2 diabetes mellitus without complication, without long-term current use of insulin ([place] [facility]   Relevant Orders   CBC w/Diff   Basic Metabolic Panel (BMET)  Chronic obstructive pulmonary disease (HCC)   Relevant Orders   CBC w/Diff   Basic Metabolic Panel (BMET)   Ambulatory referral to Physical Therapy    Other Visit Diagnoses    Supplemental oxygen dependent       Relevant Orders   Ambulatory referral to Physical Therapy      Advised patient to follow-up with his dentist and let them know he has been in the hospital. He is also on Eliquis. Does this need to be stopped prior to the surgery. I have reached out to pulmonology on their overall thoughts regarding  him having the procedure tomorrow. Will await their opinion and advised patient.   Follow-up: 3 momths   Kennyth Arnold, FNP

## 2020-10-02 ENCOUNTER — Encounter: Payer: Self-pay | Admitting: Adult Health

## 2020-10-02 ENCOUNTER — Telehealth: Payer: Self-pay

## 2020-10-02 ENCOUNTER — Other Ambulatory Visit: Payer: Self-pay | Admitting: *Deleted

## 2020-10-02 ENCOUNTER — Ambulatory Visit (INDEPENDENT_AMBULATORY_CARE_PROVIDER_SITE_OTHER): Payer: Medicare Other | Admitting: Adult Health

## 2020-10-02 ENCOUNTER — Telehealth: Payer: Self-pay | Admitting: Emergency Medicine

## 2020-10-02 DIAGNOSIS — G4733 Obstructive sleep apnea (adult) (pediatric): Secondary | ICD-10-CM | POA: Diagnosis not present

## 2020-10-02 DIAGNOSIS — I5042 Chronic combined systolic (congestive) and diastolic (congestive) heart failure: Secondary | ICD-10-CM

## 2020-10-02 DIAGNOSIS — J449 Chronic obstructive pulmonary disease, unspecified: Secondary | ICD-10-CM | POA: Diagnosis not present

## 2020-10-02 DIAGNOSIS — J9611 Chronic respiratory failure with hypoxia: Secondary | ICD-10-CM

## 2020-10-02 NOTE — Progress Notes (Signed)
Virtual Visit via Telephone Note  I connected with Kenneth [name] on 10/02/20 at 12:00 PM EST by telephone and verified that I am speaking with the correct person using two identifiers.  Location: Patient: Home  Provider: Office    I discussed the limitations, risks, security and privacy concerns of performing an evaluation and management service by telephone and the availability of in person appointments. I also discussed with the patient that there may be a patient responsible charge related to this service. The patient expressed understanding and agreed to proceed.   History of Present Illness: 60 year old male former smoker followed for very severe COPD with asthma, obstructive sleep apnea on nocturnal CPAP Medical history significant for A. fib on chronic anticoagulation, diabetes, hypertension, congestive heart failure  TEST/EVENTS :  Pulmonary function testing May 02, 2020 showed FEV1 at 25%, ratio 42, FVC 48%, no significant bronchodilator response., Severe mid flow obstruction with reversibility, DLCO 50%.  2D echo April 07, 2020 EF 45 to 50%, normal pulmonary artery systolic pressure  Today's telemedicine visit is a post hospital follow-up. Patient has been hospitalized twice over the last month for COPD exacerbation. Admission in December was for severe COPD exacerbation acute on chronic respiratory failure. Patient did require intubation and mechanical ventilatory support. Chest x-ray that showed multifocal bilateral interstitial opacities. He was treated with IV antibiotics steroids and pulmonary hygiene. Was extubated. He did require diuresis. He was continued on nocturnal CPAP after extubation. Patient has known obstructive sleep apnea and is on CPAP at bedtime. CPAP download shows moderate compliance. Patient is encouraged on improving his compliance and length of use. Is also advised to use during nap time. Patient had altered mental status during this admission. Was felt  likely due to his severe COPD, hypercarbia and multiple psych medications Most recent admission patient was treated for COPD exacerbation with antibiotics and steroids. Discharged on azithromycin and prednisone. Patient says he is feeling better. Feels that his breathing has gotten much better since being home the last few days. He denies any mopped assist chest pain orthopnea PND or leg swelling. Patient says he is try to use his CPAP. He was encouraged on compliance. He does remain on oxygen 2 L. He has taken Trelegy inhaler once daily. And has DuoNeb nebulizer as needed   Observations/Objective: Speaks in full sentences with no audible distress.  Assessment and Plan: Severe COPD prone to recurrent exacerbations. Patient is cautioned on the use of sedating medications. And to avoid oversedation. He was also encouraged on CPAP compliance.  Chronic respiratory failure on oxygen. Patient is continue on oxygen at 2 L. To keep O2 saturations greater than 88 to 90%  Obstructive sleep apnea. Patient is encouraged on CPAP compliance.  Plan  Patient Instructions  Continue on TRELEGY 1 puff daily, rinse after use.  Duoneb nebs As needed   Continue on Oxygen 2l/m  Continue on CPAP At bedtime  . -need to wear all night long and naps.  Follow up with Dr. Delton Coombes or Parrett NP in 4-6 weeks  and As needed  Please contact office for sooner follow up if symptoms do not improve or worsen or seek emergency care       Follow Up Instructions: Follow up in 4 weks and As needed     I discussed the assessment and treatment plan with the patient. The patient was provided an opportunity to ask questions and all were answered. The patient agreed with the plan and demonstrated an understanding of the instructions.  The patient was advised to call back or seek an in-person evaluation if the symptoms worsen or if the condition fails to improve as anticipated.  I provided 26 minutes of non-face-to-face time during  this encounter.   Rubye Oaks, NP

## 2020-10-02 NOTE — Telephone Encounter (Signed)
Pt has called back wanting to change appt for today 10-02-20 at 12pm into a televisit instead . Please advise . Call back [phone]

## 2020-10-02 NOTE — Patient Instructions (Addendum)
Continue on TRELEGY 1 puff daily, rinse after use.  Duoneb nebs As needed   Continue on Oxygen 2l/m  Continue on CPAP At bedtime  . -need to wear all night long and naps.  Follow up with Dr. Delton Coombes or Glorine Hanratty NP in 4-6 weeks  and As needed  Please contact office for sooner follow up if symptoms do not improve or worsen or seek emergency care

## 2020-10-02 NOTE — Chronic Care Management (AMB) (Signed)
10/01/2020- Received message from [facility] that two prescriptions were sent in one for aripiprazole and the other for buspirone.  They are wondering if patient needs both of these medications filled. Patient called three times but calls were not being accepted.   10/02/2020- Called prescriber Meghan Blankmann to inquire if patient should be getting both medications, left message to return call.  Called patient to see if phone is available. Patient answered, asked patient if he needs both Buspirone and Arpiprazole sent from [facility] due to them receiving both of these medications at one time. Patient stated he does need both of these medications and aware that [facility] will contact him tomorrow for delivery. [facility] also notified. Angelena Sole, CPP notified.   Billee Cashing, CMA Clinical Pharmacist Assistant [phone]

## 2020-10-02 NOTE — Addendum Note (Signed)
Addended by: Delrae Rend on: 10/02/2020 02:58 PM   Modules accepted: Orders

## 2020-10-02 NOTE — Progress Notes (Signed)
fo

## 2020-10-02 NOTE — Telephone Encounter (Signed)
Spoke with Heather,RN with Tammy,NP.  Herbert Seta is going to call Patient to follow up.

## 2020-10-02 NOTE — Patient Outreach (Signed)
[facility]) Care Management  10/02/2020  Kenneth [name] [birth year] [id number]   Noted that member had an observation admission 1/2-1/3 for COPD exacerbation.  State he has improved, still having the usual shortness of breath.  Has appointment with pulmonology today, will need to change office visit to virtual due to transportation.  Will also have follow up with cardiology on 1/21.  Discussed use of BiPAP, state he now has equipment from [place].  Also have equipment from [employer], will have them come pick up.  Monitor's oxygen level, range 96-98% on home O2.  Blood pressure has been slightly elevated, report 160's/80's, discussed medication management.  Encouraged to record and share trends with provider for better BP control.  State blood sugars have remained controlled, range 90-120.   Home health PT was ordered, not started yet but have received call for assessment.  Denies any urgent concerns, encouraged to contact this care manager with questions.  Agrees to follow up within the next month.   Goals Addressed            This Visit's Progress   . [facility] - Track and Manage Fluids and Swelling-Heart Failure   On track    Timeframe:  Short-Term Goal Priority:  Medium Start Date:     09/18/2020                        Expected End Date:    10/19/2020                   Follow Up Date 10/02/2020   - track weight in diary - use salt in moderation - watch for swelling in feet, ankles and legs every day - weigh myself daily    Why is this important?    It is important to check your weight daily and watch how much salt and liquids you have.   It will help you to manage your heart failure.    Notes:     . [facility] - Track and Manage My Symptoms   On track    Timeframe:  Short-Term Goal Priority:  Medium Start Date:     09/04/2020                        Expected End Date:    10/05/2020                     - develop a rescue plan - keep follow-up appointments    Why is this  important?   Tracking your symptoms and other information about your health helps your doctor plan your care.  Write down the symptoms, the time of day, what you were doing and what medicine you are taking.  You will soon learn how to manage your symptoms.     Notes:   11/10 - reminded of appointments next week  12/14 - Reviewed upcoming appointments with Cardiology and pulmonology    . [facility] - Track and Manage My Triggers   On track    Timeframe:  Long-Range Goal Priority:  High Start Date:        09/04/2020                     Expected End Date:         11/05/2020                - eliminate smoking in my home -  identify and remove indoor air pollutants - listen for public air quality announcements every day    Why is this important?   Triggers are activities or things, like tobacco smoke or cold weather, that make your COPD (chronic obstructive pulmonary disease) flare-up.  Knowing these triggers helps you plan how to stay away from them.  When you cannot remove them, you can learn how to manage them.     Notes:   11/10 - Reviewed use of inhalers and oxygen  12/14 - Reviewed COPD action plan, encouraged to contact provider office with concerns       [name], RN, MSN [facility] Care Management  [facility] Manager [phone]

## 2020-10-03 ENCOUNTER — Inpatient Hospital Stay (HOSPITAL_COMMUNITY)
Admission: EM | Admit: 2020-10-03 | Discharge: 2020-10-13 | DRG: 296 | Disposition: A | Discharge status: Home / Self Care | Payer: Medicare Other | Attending: Internal Medicine | Admitting: Internal Medicine

## 2020-10-03 ENCOUNTER — Emergency Department (HOSPITAL_COMMUNITY): Payer: Medicare Other

## 2020-10-03 DIAGNOSIS — G9341 Metabolic encephalopathy: Secondary | ICD-10-CM | POA: Diagnosis not present

## 2020-10-03 DIAGNOSIS — I160 Hypertensive urgency: Secondary | ICD-10-CM | POA: Diagnosis present

## 2020-10-03 DIAGNOSIS — J9621 Acute and chronic respiratory failure with hypoxia: Secondary | ICD-10-CM | POA: Diagnosis present

## 2020-10-03 DIAGNOSIS — R092 Respiratory arrest: Secondary | ICD-10-CM | POA: Diagnosis not present

## 2020-10-03 DIAGNOSIS — Z888 Allergy status to other drugs, medicaments and biological substances status: Secondary | ICD-10-CM

## 2020-10-03 DIAGNOSIS — Z833 Family history of diabetes mellitus: Secondary | ICD-10-CM

## 2020-10-03 DIAGNOSIS — I469 Cardiac arrest, cause unspecified: Secondary | ICD-10-CM | POA: Diagnosis not present

## 2020-10-03 DIAGNOSIS — J151 Pneumonia due to Pseudomonas: Secondary | ICD-10-CM | POA: Diagnosis not present

## 2020-10-03 DIAGNOSIS — Z4682 Encounter for fitting and adjustment of non-vascular catheter: Secondary | ICD-10-CM | POA: Diagnosis not present

## 2020-10-03 DIAGNOSIS — K5900 Constipation, unspecified: Secondary | ICD-10-CM | POA: Diagnosis not present

## 2020-10-03 DIAGNOSIS — Z7901 Long term (current) use of anticoagulants: Secondary | ICD-10-CM

## 2020-10-03 DIAGNOSIS — R0789 Other chest pain: Secondary | ICD-10-CM | POA: Diagnosis not present

## 2020-10-03 DIAGNOSIS — G4733 Obstructive sleep apnea (adult) (pediatric): Secondary | ICD-10-CM | POA: Diagnosis present

## 2020-10-03 DIAGNOSIS — I480 Paroxysmal atrial fibrillation: Secondary | ICD-10-CM | POA: Diagnosis not present

## 2020-10-03 DIAGNOSIS — J9690 Respiratory failure, unspecified, unspecified whether with hypoxia or hypercapnia: Secondary | ICD-10-CM | POA: Diagnosis not present

## 2020-10-03 DIAGNOSIS — Z9289 Personal history of other medical treatment: Secondary | ICD-10-CM | POA: Diagnosis not present

## 2020-10-03 DIAGNOSIS — N1831 Chronic kidney disease, stage 3a: Secondary | ICD-10-CM | POA: Diagnosis present

## 2020-10-03 DIAGNOSIS — R0602 Shortness of breath: Secondary | ICD-10-CM

## 2020-10-03 DIAGNOSIS — Z20822 Contact with and (suspected) exposure to covid-19: Secondary | ICD-10-CM | POA: Diagnosis not present

## 2020-10-03 DIAGNOSIS — E1122 Type 2 diabetes mellitus with diabetic chronic kidney disease: Secondary | ICD-10-CM | POA: Diagnosis present

## 2020-10-03 DIAGNOSIS — J9602 Acute respiratory failure with hypercapnia: Secondary | ICD-10-CM

## 2020-10-03 DIAGNOSIS — I5042 Chronic combined systolic (congestive) and diastolic (congestive) heart failure: Secondary | ICD-10-CM | POA: Diagnosis present

## 2020-10-03 DIAGNOSIS — F17200 Nicotine dependence, unspecified, uncomplicated: Secondary | ICD-10-CM | POA: Diagnosis present

## 2020-10-03 DIAGNOSIS — E876 Hypokalemia: Secondary | ICD-10-CM | POA: Diagnosis not present

## 2020-10-03 DIAGNOSIS — M1990 Unspecified osteoarthritis, unspecified site: Secondary | ICD-10-CM | POA: Diagnosis present

## 2020-10-03 DIAGNOSIS — J440 Chronic obstructive pulmonary disease with acute lower respiratory infection: Secondary | ICD-10-CM | POA: Diagnosis not present

## 2020-10-03 DIAGNOSIS — Z978 Presence of other specified devices: Secondary | ICD-10-CM | POA: Diagnosis not present

## 2020-10-03 DIAGNOSIS — R011 Cardiac murmur, unspecified: Secondary | ICD-10-CM | POA: Diagnosis present

## 2020-10-03 DIAGNOSIS — I428 Other cardiomyopathies: Secondary | ICD-10-CM | POA: Diagnosis present

## 2020-10-03 DIAGNOSIS — K219 Gastro-esophageal reflux disease without esophagitis: Secondary | ICD-10-CM | POA: Diagnosis not present

## 2020-10-03 DIAGNOSIS — J90 Pleural effusion, not elsewhere classified: Secondary | ICD-10-CM

## 2020-10-03 DIAGNOSIS — F419 Anxiety disorder, unspecified: Secondary | ICD-10-CM | POA: Diagnosis present

## 2020-10-03 DIAGNOSIS — N179 Acute kidney failure, unspecified: Secondary | ICD-10-CM | POA: Diagnosis not present

## 2020-10-03 DIAGNOSIS — R079 Chest pain, unspecified: Secondary | ICD-10-CM | POA: Diagnosis not present

## 2020-10-03 DIAGNOSIS — I130 Hypertensive heart and chronic kidney disease with heart failure and stage 1 through stage 4 chronic kidney disease, or unspecified chronic kidney disease: Secondary | ICD-10-CM | POA: Diagnosis present

## 2020-10-03 DIAGNOSIS — I462 Cardiac arrest due to underlying cardiac condition: Secondary | ICD-10-CM | POA: Diagnosis not present

## 2020-10-03 DIAGNOSIS — J80 Acute respiratory distress syndrome: Secondary | ICD-10-CM | POA: Diagnosis not present

## 2020-10-03 DIAGNOSIS — I499 Cardiac arrhythmia, unspecified: Secondary | ICD-10-CM | POA: Diagnosis not present

## 2020-10-03 DIAGNOSIS — J449 Chronic obstructive pulmonary disease, unspecified: Secondary | ICD-10-CM | POA: Diagnosis not present

## 2020-10-03 DIAGNOSIS — J9601 Acute respiratory failure with hypoxia: Secondary | ICD-10-CM

## 2020-10-03 DIAGNOSIS — J9622 Acute and chronic respiratory failure with hypercapnia: Secondary | ICD-10-CM | POA: Diagnosis present

## 2020-10-03 DIAGNOSIS — I5023 Acute on chronic systolic (congestive) heart failure: Secondary | ICD-10-CM | POA: Diagnosis present

## 2020-10-03 DIAGNOSIS — J690 Pneumonitis due to inhalation of food and vomit: Secondary | ICD-10-CM | POA: Diagnosis present

## 2020-10-03 DIAGNOSIS — E1159 Type 2 diabetes mellitus with other circulatory complications: Secondary | ICD-10-CM | POA: Diagnosis present

## 2020-10-03 DIAGNOSIS — Z791 Long term (current) use of non-steroidal anti-inflammatories (NSAID): Secondary | ICD-10-CM

## 2020-10-03 DIAGNOSIS — Z9981 Dependence on supplemental oxygen: Secondary | ICD-10-CM

## 2020-10-03 DIAGNOSIS — I10 Essential (primary) hypertension: Secondary | ICD-10-CM | POA: Diagnosis present

## 2020-10-03 DIAGNOSIS — I5043 Acute on chronic combined systolic (congestive) and diastolic (congestive) heart failure: Secondary | ICD-10-CM | POA: Diagnosis not present

## 2020-10-03 DIAGNOSIS — E669 Obesity, unspecified: Secondary | ICD-10-CM | POA: Diagnosis present

## 2020-10-03 DIAGNOSIS — E119 Type 2 diabetes mellitus without complications: Secondary | ICD-10-CM | POA: Diagnosis not present

## 2020-10-03 DIAGNOSIS — Z91018 Allergy to other foods: Secondary | ICD-10-CM | POA: Diagnosis not present

## 2020-10-03 DIAGNOSIS — I152 Hypertension secondary to endocrine disorders: Secondary | ICD-10-CM | POA: Diagnosis present

## 2020-10-03 DIAGNOSIS — F32A Depression, unspecified: Secondary | ICD-10-CM | POA: Diagnosis present

## 2020-10-03 DIAGNOSIS — Z841 Family history of disorders of kidney and ureter: Secondary | ICD-10-CM

## 2020-10-03 DIAGNOSIS — E785 Hyperlipidemia, unspecified: Secondary | ICD-10-CM | POA: Diagnosis present

## 2020-10-03 DIAGNOSIS — J441 Chronic obstructive pulmonary disease with (acute) exacerbation: Secondary | ICD-10-CM | POA: Diagnosis not present

## 2020-10-03 DIAGNOSIS — Z8249 Family history of ischemic heart disease and other diseases of the circulatory system: Secondary | ICD-10-CM

## 2020-10-03 DIAGNOSIS — I4891 Unspecified atrial fibrillation: Secondary | ICD-10-CM | POA: Diagnosis not present

## 2020-10-03 LAB — BASIC METABOLIC PANEL
Anion gap: 18 — ABNORMAL HIGH (ref 5–15)
BUN: 10 mg/dL (ref 6–20)
CO2: 27 mmol/L (ref 22–32)
Calcium: 8.7 mg/dL — ABNORMAL LOW (ref 8.9–10.3)
Chloride: 93 mmol/L — ABNORMAL LOW (ref 98–111)
Creatinine, Ser: 1.78 mg/dL — ABNORMAL HIGH (ref 0.61–1.24)
GFR, Estimated: 43 mL/min — ABNORMAL LOW (ref >60)
Glucose, Bld: 261 mg/dL — ABNORMAL HIGH (ref 70–99)
Potassium: 3.9 mmol/L (ref 3.5–5.1)
Sodium: 138 mmol/L (ref 135–145)

## 2020-10-03 LAB — CBC
HCT: 34.1 % — ABNORMAL LOW (ref 39.0–52.0)
Hemoglobin: 9.9 g/dL — ABNORMAL LOW (ref 13.0–17.0)
MCH: 27.7 pg (ref 26.0–34.0)
MCHC: 29 g/dL — ABNORMAL LOW (ref 30.0–36.0)
MCV: 95.3 fL (ref 80.0–100.0)
Platelets: 213 10*3/uL (ref 150–400)
RBC: 3.58 MIL/uL — ABNORMAL LOW (ref 4.22–5.81)
RDW: 13.2 % (ref 11.5–15.5)
WBC: 14.9 10*3/uL — ABNORMAL HIGH (ref 4.0–10.5)
nRBC: 0 % (ref 0.0–0.2)

## 2020-10-03 LAB — URINALYSIS, ROUTINE W REFLEX MICROSCOPIC
Bilirubin Urine: NEGATIVE
Glucose, UA: >=500 mg/dL — AB
Hgb urine dipstick: NEGATIVE
Ketones, ur: NEGATIVE mg/dL
Leukocytes,Ua: NEGATIVE
Nitrite: NEGATIVE
Protein, ur: >=300 mg/dL — AB
Specific Gravity, Urine: 1.016 (ref 1.005–1.030)
pH: 6 (ref 5.0–8.0)

## 2020-10-03 LAB — I-STAT CHEM 8, ED
BUN: 11 mg/dL (ref 6–20)
Calcium, Ion: 1.04 mmol/L — ABNORMAL LOW (ref 1.15–1.40)
Chloride: 93 mmol/L — ABNORMAL LOW (ref 98–111)
Creatinine, Ser: 1.7 mg/dL — ABNORMAL HIGH (ref 0.61–1.24)
Glucose, Bld: 264 mg/dL — ABNORMAL HIGH (ref 70–99)
HCT: 35 % — ABNORMAL LOW (ref 39.0–52.0)
Hemoglobin: 11.9 g/dL — ABNORMAL LOW (ref 13.0–17.0)
Potassium: 3.9 mmol/L (ref 3.5–5.1)
Sodium: 137 mmol/L (ref 135–145)
TCO2: 31 mmol/L (ref 22–32)

## 2020-10-03 LAB — I-STAT ARTERIAL BLOOD GAS, ED
Acid-Base Excess: 15 mmol/L — ABNORMAL HIGH (ref 0.0–2.0)
Bicarbonate: 42.1 mmol/L — ABNORMAL HIGH (ref 20.0–28.0)
Calcium, Ion: 1.15 mmol/L (ref 1.15–1.40)
HCT: 28 % — ABNORMAL LOW (ref 39.0–52.0)
Hemoglobin: 9.5 g/dL — ABNORMAL LOW (ref 13.0–17.0)
O2 Saturation: 100 %
Patient temperature: 96.7
Potassium: 3.8 mmol/L (ref 3.5–5.1)
Sodium: 136 mmol/L (ref 135–145)
TCO2: 44 mmol/L — ABNORMAL HIGH (ref 22–32)
pCO2 arterial: 68.1 mmHg (ref 32.0–48.0)
pH, Arterial: 7.394 (ref 7.350–7.450)
pO2, Arterial: 501 mmHg — ABNORMAL HIGH (ref 83.0–108.0)

## 2020-10-03 LAB — RESP PANEL BY RT-PCR (FLU A&B, COVID) ARPGX2
Influenza A by PCR: NEGATIVE
Influenza B by PCR: NEGATIVE
SARS Coronavirus 2 by RT PCR: NEGATIVE

## 2020-10-03 LAB — ETHANOL: Alcohol, Ethyl (B): <10 mg/dL (ref <10)

## 2020-10-03 LAB — HEPATIC FUNCTION PANEL
ALT: 57 U/L — ABNORMAL HIGH (ref 0–44)
AST: 86 U/L — ABNORMAL HIGH (ref 15–41)
Albumin: 3.3 g/dL — ABNORMAL LOW (ref 3.5–5.0)
Alkaline Phosphatase: 81 U/L (ref 38–126)
Bilirubin, Direct: <0.1 mg/dL (ref 0.0–0.2)
Total Bilirubin: 0.3 mg/dL (ref 0.3–1.2)
Total Protein: 6.2 g/dL — ABNORMAL LOW (ref 6.5–8.1)

## 2020-10-03 LAB — LACTIC ACID, PLASMA
Lactic Acid, Venous: 8.8 mmol/L (ref 0.5–1.9)
Lactic Acid, Venous: 3.8 mmol/L (ref 0.5–1.9)

## 2020-10-03 LAB — BRAIN NATRIURETIC PEPTIDE: B Natriuretic Peptide: 960.1 pg/mL — ABNORMAL HIGH (ref 0.0–100.0)

## 2020-10-03 LAB — CBG MONITORING, ED: Glucose-Capillary: 247 mg/dL — ABNORMAL HIGH (ref 70–99)

## 2020-10-03 LAB — TROPONIN I (HIGH SENSITIVITY)
Troponin I (High Sensitivity): 44 ng/L — ABNORMAL HIGH (ref <18)
Troponin I (High Sensitivity): 167 ng/L (ref <18)

## 2020-10-03 MED ORDER — FENTANYL CITRATE (PF) 100 MCG/2ML IJ SOLN
INTRAMUSCULAR | Status: AC
  Filled 2020-10-03: qty 2

## 2020-10-03 MED ORDER — NOREPINEPHRINE 4 MG/250ML-% IV SOLN
INTRAVENOUS | Status: AC
  Administered 2020-10-03: 10 ug/min via INTRAVENOUS
  Filled 2020-10-03: qty 250

## 2020-10-03 MED ORDER — IOHEXOL 350 MG/ML SOLN
60.0000 mL | Freq: Once | INTRAVENOUS | Status: AC | PRN
  Administered 2020-10-03: 60 mL via INTRAVENOUS

## 2020-10-03 MED ORDER — PROPOFOL 1000 MG/100ML IV EMUL
5.0000 ug/kg/min | INTRAVENOUS | Status: DC
  Administered 2020-10-04: 40 ug/kg/min via INTRAVENOUS
  Administered 2020-10-04 (×3): 45 ug/kg/min via INTRAVENOUS
  Administered 2020-10-04 – 2020-10-05 (×3): 40 ug/kg/min via INTRAVENOUS
  Filled 2020-10-03 (×7): qty 100

## 2020-10-03 MED ORDER — PROPOFOL 1000 MG/100ML IV EMUL
INTRAVENOUS | Status: AC
  Administered 2020-10-03: 5 ug/kg/min via INTRAVENOUS
  Filled 2020-10-03: qty 100

## 2020-10-03 MED ORDER — ETOMIDATE 2 MG/ML IV SOLN
20.0000 mg | Freq: Once | INTRAVENOUS | Status: AC
  Administered 2020-10-03: 20 mg via INTRAVENOUS

## 2020-10-03 MED ORDER — FENTANYL CITRATE (PF) 100 MCG/2ML IJ SOLN
100.0000 ug | Freq: Once | INTRAMUSCULAR | Status: AC
  Administered 2020-10-04: 100 ug via INTRAVENOUS

## 2020-10-03 MED ORDER — FENTANYL CITRATE (PF) 100 MCG/2ML IJ SOLN: 50.0000 ug | Freq: Once | INTRAMUSCULAR | Status: AC

## 2020-10-03 MED ORDER — FUROSEMIDE 10 MG/ML IJ SOLN
40.0000 mg | Freq: Once | INTRAMUSCULAR | Status: AC
  Administered 2020-10-03: 40 mg via INTRAVENOUS
  Filled 2020-10-03: qty 4

## 2020-10-03 MED ORDER — FENTANYL CITRATE (PF) 100 MCG/2ML IJ SOLN
50.0000 ug | Freq: Once | INTRAMUSCULAR | Status: AC
  Administered 2020-10-03: 50 ug via INTRAVENOUS
  Filled 2020-10-03: qty 2

## 2020-10-03 MED ORDER — NOREPINEPHRINE 4 MG/250ML-% IV SOLN
0.0000 ug/min | INTRAVENOUS | Status: DC
  Administered 2020-10-04: 2 ug/min via INTRAVENOUS
  Filled 2020-10-03: qty 250

## 2020-10-03 MED ORDER — ROCURONIUM BROMIDE 50 MG/5ML IV SOLN
100.0000 mg | Freq: Once | INTRAVENOUS | Status: AC
  Administered 2020-10-03: 100 mg via INTRAVENOUS
  Filled 2020-10-03: qty 10

## 2020-10-03 MED ORDER — FENTANYL CITRATE (PF) 100 MCG/2ML IJ SOLN
INTRAMUSCULAR | Status: AC
  Administered 2020-10-03: 50 ug via INTRAVENOUS
  Filled 2020-10-03: qty 2

## 2020-10-03 NOTE — ED Provider Notes (Signed)
Procedure Name: Intubation Date/Time: 10/03/2020 9:01 PM Performed by: Cristina Gong, PA-C Pre-anesthesia Checklist: Emergency Drugs available, Suction available, Patient being monitored, Timeout performed and Patient identified Oxygen Delivery Method: Ambu bag Preoxygenation: Pre-oxygenation with 100% oxygen Induction Type: Rapid sequence and Cricoid Pressure applied Laryngoscope Size: Glidescope and 3 Grade View: Grade I Tube size: 7.5 mm Number of attempts: 1 Airway Equipment and Method: Video-laryngoscopy and Stylet Placement Confirmation: ETT inserted through vocal cords under direct vision,  Positive ETCO2 and Breath sounds checked- equal and bilateral Secured at: 27 cm Tube secured with: ETT holder Dental Injury: Teeth and Oropharynx as per pre-operative assessment       Direct supervision by Dr. Myrtis Ser.     Norman Clay 10/03/20 2326    Sabino Donovan, MD 10/04/20 [phone]

## 2020-10-03 NOTE — ED Notes (Signed)
Sister and fiance brought to bedside, updated by provider. Chaplin for support

## 2020-10-03 NOTE — ED Triage Notes (Signed)
Pt arrives via [facility], pt witnessed arrest outside the fire station. Pt was PEA, approx 5-7 minutes of CPR, given 1 epi with ROSC. He then started to wake up, he was given 50 fentanyl and 2.5 midazolam. Bilateral 18 guages in the [facility]. No fluids given en route. King airway in place on arrival with assistance of BVM. Per report, the pt had been c/o SOB all day long, had 2 nebs today. Also had been having CP for a week, taking approx 2-3 nitro daily for his pain.

## 2020-10-03 NOTE — ED Notes (Signed)
Returned from ct 

## 2020-10-03 NOTE — Progress Notes (Signed)
Chaplain responded to request from staff that patient's family had arrived in consult.  Chaplain engaged the family, present were patient's fiance and her mother and later patient's sister arrived.  Fiance was emotional in telling the events of today and is worried.  Chaplain offered space for family to share.  Chaplain connected MD and family to update on status.  Family not able to go back just yet.  Chaplain built rapport and offered reflective listening and words of support.  Chaplain available as needed. Chaplain Agustin Cree, Mdiv.    10/03/20 2055  Clinical Encounter Type  Visited With Family;Health care provider;Patient not available  Visit Type Critical Care  Referral From Nurse  Consult/Referral To Chaplain  Spiritual Encounters  Spiritual Needs Emotional

## 2020-10-03 NOTE — ED Provider Notes (Signed)
[facility] EMERGENCY DEPARTMENT Provider Note   CSN: [id number] Arrival date & time: 10/03/20  2007     History Chief Complaint  Patient presents with  . Cardiac Arrest    Johndaniel Catlin is a 60 y.o. male.   Cardiac Arrest Witnessed by:  Healthcare provider Incident location: fire department. Time before BLS initiated:  Immediate Time before ALS initiated:  Immediate Condition upon EMS arrival:  Unresponsive Pulse:  Absent Initial cardiac rhythm per EMS:  PEA Treatments prior to arrival:  ACLS protocol Medications given prior to ED:  Epinephrine Airway: king airway. Rhythm on admission to ED:  Sinus tachycardia Associated symptoms: chest pain and difficulty breathing   Risk factors: heart problem        Past Medical History:  Diagnosis Date  . Anxiety   . Arthritis   . Atrial fibrillation ([place])   . CHF (congestive heart failure) ([place])   . COPD (chronic obstructive pulmonary disease) (HCC)    emphysema  . Depression   . Diabetes mellitus without complication ([place])   . Heart murmur   . Hyperlipidemia   . Hypertension   . Sleep apnea with use of continuous positive airway pressure (CPAP)     Patient Active Problem List   Diagnosis Date Noted  . COPD with acute exacerbation ([place]) 09/23/2020  . Acute kidney injury superimposed on CKD ([place]) 09/23/2020  . Acute respiratory failure ([place]) 09/13/2020  . Thiamine deficiency 07/02/2020  . Acute respiratory distress 06/21/2020  . Chronic respiratory failure with hypoxia ([place]) 06/01/2020  . Obstructive sleep apnea 05/02/2020  . Type 2 diabetes mellitus with diabetic neuropathy, without long-term current use of insulin ([place]) 04/24/2020  . Hyperlipidemia   . Acute on chronic combined systolic and diastolic CHF (congestive heart failure) ([place]) 04/07/2020  . Chronic constipation 07/19/2019  . Hospital discharge follow-up 06/17/2019  . Shortness of breath 06/12/2019  . Chest tightness 06/12/2019  .  Snoring 03/29/2019  . Insomnia due to other mental disorder 11/15/2018  . Anticoagulant long-term use 10/21/2018  . Gastroesophageal reflux disease without esophagitis 08/10/2018  . Tobacco abuse 08/10/2018  . Chest pain 07/27/2018  . PAF (paroxysmal atrial fibrillation) ([place]) 07/27/2018  . Mild renal insufficiency 07/27/2018  . Chronic combined systolic and diastolic heart failure ([place]) 07/27/2018  . Slow transit constipation 07/20/2018  . Essential hypertension 07/06/2018  . Chronic obstructive pulmonary disease ([place]) 07/06/2018  . Controlled type 2 diabetes mellitus without complication, without long-term current use of insulin ([place]) 07/06/2018  . Depression 07/06/2018  . Macrocytic anemia 07/06/2018  . Healthcare maintenance 07/06/2018  . Alcohol abuse 07/06/2018    Past Surgical History:  Procedure Laterality Date  . NO PAST SURGERIES         Family History  Problem Relation Age of Onset  . Diabetes Mother   . Heart disease Mother   . Diabetes Father   . Heart disease Father   . Diabetes Sister   . Heart disease Sister   . Kidney disease Sister   . Coronary artery disease Brother   . Liver disease Maternal Uncle     Social History   Tobacco Use  . Smoking status: Former Smoker    Packs/day: 2.00    Years: 30.00    Pack years: 60.00    Types: Cigarettes    Quit date: 05/01/2019    Years since quitting: 1.4  . Smokeless tobacco: Never Used  . Tobacco comment: smokes 2-3cigs per day  Vaping Use  . Vaping  Use: Never used  Substance Use Topics  . Alcohol use: Yes    Comment: Drinks up to six beers around a game  . Drug use: Yes    Types: Marijuana    Comment: occ marijuana    Home Medications Prior to Admission medications   Medication Sig Start Date End Date Taking? Authorizing Provider  acetaminophen (TYLENOL) 325 MG tablet Take 650 mg by mouth every 6 (six) hours as needed for mild pain or headache.    [provider]  albuterol (PROVENTIL)  (2.5 MG/3ML) 0.083% nebulizer solution INHALE 1 VIAL VIA NEBULIZER 5 TIMES DAILY AS NEEDED FOR WHEEZING OR FOR SHORTNESS OF BREATH Patient taking differently: Take 2.5 mg by nebulization See admin instructions. 5 times daily prn wheezing or shortness of breath 05/22/20   [name], MD  apixaban (ELIQUIS) 5 MG TABS tablet Take 1 tablet (5 mg total) by mouth 2 (two) times daily. 04/23/20   Buford Dresser, MD  ARIPiprazole (ABILIFY) 5 MG tablet Take 5 mg by mouth daily. 08/24/20   [provider]  Blood Glucose Monitoring Suppl (ONE TOUCH ULTRA 2) w/Device KIT Use to test blood sugars 1-2 times daily. 04/02/20   [name], MD  busPIRone (BUSPAR) 15 MG tablet Take 1 tablet (15 mg total) by mouth 2 (two) times daily. 09/06/19   [name], MD  carvedilol (COREG) 12.5 MG tablet Take 1 tablet (12.5 mg total) by mouth 2 (two) times daily with a meal. 04/23/20   Buford Dresser, MD  DM-GG & DM-APAP-CPM (CORICIDIN HBP DAY/NIGHT COLD) 10-20 &15-200-2 MG MISC Take 1 tablet by mouth every 12 (twelve) hours as needed (cold symptoms).    [provider]  famotidine (PEPCID) 20 MG tablet Take 20 mg by mouth daily.    [provider]  FLUoxetine (PROZAC) 20 MG capsule TAKE 3 CAPSULES BY MOUTH  DAILY Patient taking differently: Take 60 mg by mouth daily. 05/26/19   [name], MD  Fluticasone-Umeclidin-Vilant (TRELEGY ELLIPTA) 100-62.5-25 MCG/INH AEPB Inhale 1 puff into the lungs daily. 04/02/20   Collene Gobble, MD  furosemide (LASIX) 40 MG tablet Take 1 tablet (40 mg total) by mouth 2 (two) times daily. 08/02/20   Buford Dresser, MD  gabapentin (NEURONTIN) 300 MG capsule TAKE ONE CAPSULE BY MOUTH THREE TIMES DAILY Patient taking differently: Take 300 mg by mouth 3 (three) times daily. 08/03/20   [name], MD  glucose blood [facility] ULTRA) test strip USE TO TEST BLOOD SUGAR 1-2 TIMES DAILY 05/22/20   [name], MD  guaiFENesin (MUCINEX) 600 MG 12 hr tablet Take 1 tablet (600 mg total) by mouth 2 (two) times daily. 09/24/20   Regalado, Belkys A, MD  ipratropium-albuterol (DUONEB) 0.5-2.5 (3) MG/3ML SOLN Take 3 mLs by nebulization every 6 (six) hours as needed. 09/24/20   Regalado, Belkys A, MD  isosorbide mononitrate (IMDUR) 30 MG 24 hr tablet Take 30 mg by mouth daily. 07/04/20   [provider]  Lancets [facility] ULTRASOFT) lancets USE TO TEST BLOOD SUGAR 1-2 TIMES DAILY 05/22/20   [name], MD  lubiprostone ([name]) 8 MCG capsule TAKE 1 CAPSULE(8 MCG) BY MOUTH TWICE DAILY WITH A MEAL Patient taking differently: Take 8 mcg by mouth 2 (two) times daily with a meal. 04/30/20   Esterwood, Amy S, PA-C  Multiple Vitamin (MULTIVITAMIN WITH MINERALS) TABS tablet Take 1 tablet by mouth daily.    [provider]  nitroGLYCERIN (NITROSTAT) 0.4 MG SL tablet  DISSOLVE 1 TABLET UNDER THE TONGUE EVERY 5 MINUTES AS  NEEDED FOR CHEST PAIN. MAX  OF 3 TABLETS IN 15 MINUTES. CALL 911 IF PAIN PERSISTS. Patient taking differently: Place 0.4 mg under the tongue every 5 (five) minutes as needed for chest pain. 07/25/20   [name], MD  pantoprazole (PROTONIX) 40 MG tablet TAKE ONE TABLET BY MOUTH EVERY MORNING Patient taking differently: Take 40 mg by mouth daily. 08/03/20   Esterwood, Amy S, PA-C  rosuvastatin (CRESTOR) 20 MG tablet Take 1 tablet (20 mg total) by mouth daily. 08/27/20   Dutch Quint B, FNP  traZODone (DESYREL) 50 MG tablet TAKE 1/2 TO 1 TABLET BY  MOUTH AT BEDTIME AS NEEDED  FOR SLEEP. Patient taking differently: Take 25-50 mg by mouth at bedtime as needed for sleep. 05/26/19   [name], MD    Allergies    Lisinopril, Other, and Tomato  Review of Systems   Review of Systems  Unable to perform ROS: Acuity of condition  HENT: Positive for congestion and rhinorrhea.   Respiratory: Positive for cough.   Cardiovascular: Positive for chest  pain.  Gastrointestinal: Positive for constipation.  Review of systems that are positive were provided by family  Physical Exam Updated Vital Signs BP 105/62   Pulse 64   Temp (!) 96.8 F (36 C)   Resp 16   Wt 92.4 kg   SpO2 100%   BMI 27.63 kg/m   Physical Exam Constitutional:      Appearance: He is normal weight. He is toxic-appearing.  HENT:     Head: Normocephalic and atraumatic.     Nose: No congestion or rhinorrhea.     Mouth/Throat:     Mouth: Mucous membranes are moist.  Eyes:     General: No scleral icterus.    Pupils: Pupils are equal, round, and reactive to light.  Cardiovascular:     Rate and Rhythm: Normal rate and regular rhythm.     Comments: Distant heart sounds Pulmonary:     Breath sounds: No wheezing or rales.     Comments: Equal sounds bilaterally, no wheeze no rales, assisted ventilation by Edison Pace airway Abdominal:     General: There is no distension.     Tenderness: There is no abdominal tenderness.  Musculoskeletal:        General: No deformity or signs of injury.     Right lower leg: No edema.     Left lower leg: No edema.  Skin:    Findings: No bruising or rash.  Neurological:     Comments: Had poor response to painful stimuli in all 4 extremities, but intermittently thrashed and did move all 4 extremities.  Pupils equal reactive to light.  No purposeful movement.     ED Results / Procedures / Treatments   Labs (all labs ordered are listed, but only abnormal results are displayed) Labs Reviewed  BASIC METABOLIC PANEL - Abnormal; Notable for the following components:      Result Value   Chloride 93 (*)    Glucose, Bld 261 (*)    Creatinine, Ser 1.78 (*)    Calcium 8.7 (*)    GFR, Estimated 43 (*)    Anion gap 18 (*)    All other components within normal limits  LACTIC ACID, PLASMA - Abnormal; Notable for the following components:   Lactic Acid, Venous 8.8 (*)    All other components within normal limits  HEPATIC FUNCTION PANEL -  Abnormal; Notable for  the following components:   Total Protein 6.2 (*)    Albumin 3.3 (*)    AST 86 (*)    ALT 57 (*)    All other components within normal limits  URINALYSIS, ROUTINE W REFLEX MICROSCOPIC - Abnormal; Notable for the following components:   Color, Urine AMBER (*)    APPearance CLOUDY (*)    Glucose, UA >=500 (*)    Protein, ur >=300 (*)    Bacteria, UA RARE (*)    Non Squamous Epithelial 0-5 (*)    All other components within normal limits  BRAIN NATRIURETIC PEPTIDE - Abnormal; Notable for the following components:   B Natriuretic Peptide 960.1 (*)    All other components within normal limits  CBG MONITORING, ED - Abnormal; Notable for the following components:   Glucose-Capillary 247 (*)    All other components within normal limits  I-STAT CHEM 8, ED - Abnormal; Notable for the following components:   Chloride 93 (*)    Creatinine, Ser 1.70 (*)    Glucose, Bld 264 (*)    Calcium, Ion 1.04 (*)    Hemoglobin 11.9 (*)    HCT 35.0 (*)    All other components within normal limits  I-STAT ARTERIAL BLOOD GAS, ED - Abnormal; Notable for the following components:   pCO2 arterial 68.1 (*)    pO2, Arterial 501 (*)    Bicarbonate 42.1 (*)    TCO2 44 (*)    Acid-Base Excess 15.0 (*)    HCT 28.0 (*)    Hemoglobin 9.5 (*)    All other components within normal limits  TROPONIN I (HIGH SENSITIVITY) - Abnormal; Notable for the following components:   Troponin I (High Sensitivity) 44 (*)    All other components within normal limits  RESP PANEL BY RT-PCR (FLU A&B, COVID) [id number]  CBC  LACTIC ACID, PLASMA  ETHANOL  TROPONIN I (HIGH SENSITIVITY)    EKG EKG Interpretation  Date/Time:  Wednesday October 03 2020 20:12:29 EST Ventricular Rate:  79 PR Interval:    QRS Duration: 200 QT Interval:  429 QTC Calculation: 492 R Axis:   78 Text Interpretation: Sinus rhythm Nonspecific intraventricular conduction delay Borderline repolarization abnormality Confirmed by Dewaine Conger [phone]) on 10/03/2020 8:59:25 PM   Radiology DG Abdomen 1 View  Result Date: 10/03/2020 CLINICAL DATA:  60 year old male status post intubation. EXAM: ABDOMEN - 1 VIEW; PORTABLE CHEST - 1 VIEW COMPARISON:  Chest CT dated 09/23/2020. FINDINGS: Endotracheal tube with tip approximately 6 cm above the carina. Enteric tube extends below the diaphragm with tip and side-port in the proximal stomach. Faint bilateral perihilar streaky densities, likely atelectasis. No focal consolidation, pleural effusion or pneumothorax. The cardiac silhouette is within limits. No acute osseous pathology. IMPRESSION: 1. Endotracheal tube above the carina. 2. Enteric tube with tip and side-port in the proximal stomach. Electronically Signed   By: Anner Crete M.D.   On: 10/03/2020 20:57   CT Head Wo Contrast  Result Date: 10/03/2020 CLINICAL DATA:  Altered mental status status post cardiac arrest EXAM: CT HEAD WITHOUT CONTRAST TECHNIQUE: Contiguous axial images were obtained from the base of the skull through the vertex without intravenous contrast. COMPARISON:  Head CT September 13, 2020. FINDINGS: Brain: No evidence of acute infarction, hemorrhage, hydrocephalus, extra-axial collection or mass lesion/mass effect. Vascular: No hyperdense vessel or unexpected calcification. Skull: Normal. Negative for fracture or focal lesion. Sinuses/Orbits: No acute finding. Other: None. IMPRESSION: No acute intracranial abnormality. Electronically Signed   By: Dellis Filbert  Nance Pew MD   On: 10/03/2020 21:34   CT Angio Chest PE W and/or Wo Contrast  Result Date: 10/03/2020 CLINICAL DATA:  Chest pain, shortness of breath, post arrest EXAM: CT ANGIOGRAPHY CHEST WITH CONTRAST TECHNIQUE: Multidetector CT imaging of the chest was performed using the standard protocol during bolus administration of intravenous contrast. Multiplanar CT image reconstructions and MIPs were obtained to evaluate the vascular anatomy. CONTRAST:  71m OMNIPAQUE IOHEXOL  350 MG/ML SOLN COMPARISON:  09/23/2020 FINDINGS: Cardiovascular: No filling defects in the pulmonary arteries to suggest pulmonary emboli. Heart is normal size. Aorta is normal caliber. Mediastinum/Nodes: No mediastinal, hilar, or axillary adenopathy. Endotracheal tube tip in the distal trachea. Thyroid and esophagus unremarkable. Lungs/Pleura: Moderate emphysema. No confluent opacities or effusions. Upper Abdomen: Imaging into the upper abdomen demonstrates no acute findings. NG tube in the stomach. Reflux of contrast into the IVC and hepatic veins suggesting right heart dysfunction. Musculoskeletal: Chest wall soft tissues are unremarkable. No acute bony abnormality. Review of the MIP images confirms the above findings. IMPRESSION: No evidence of pulmonary embolus. Reflux of contrast into the IVC and hepatic veins compatible with right heart dysfunction. Endotracheal tube and NG tube in expected position. Aortic Atherosclerosis (ICD10-I70.0) and Emphysema (ICD10-J43.9). Electronically Signed   By: KRolm BaptiseM.D.   On: 10/03/2020 21:30   DG Chest Portable 1 View  Result Date: 10/03/2020 CLINICAL DATA:  60year old male status post intubation. EXAM: ABDOMEN - 1 VIEW; PORTABLE CHEST - 1 VIEW COMPARISON:  Chest CT dated 09/23/2020. FINDINGS: Endotracheal tube with tip approximately 6 cm above the carina. Enteric tube extends below the diaphragm with tip and side-port in the proximal stomach. Faint bilateral perihilar streaky densities, likely atelectasis. No focal consolidation, pleural effusion or pneumothorax. The cardiac silhouette is within limits. No acute osseous pathology. IMPRESSION: 1. Endotracheal tube above the carina. 2. Enteric tube with tip and side-port in the proximal stomach. Electronically Signed   By: AAnner CreteM.D.   On: 10/03/2020 20:57    Procedures Ultrasound ED Echo  Date/Time: 10/03/2020 9:02 PM Performed by: KBreck Coons MD Authorized by: KBreck Coons MD   Procedure  details:    Indications: cardiac arrest     Views: parasternal long axis view, parasternal short axis view and IVC view     Images: archived   Findings:    Pericardium: no pericardial effusion     Cardiac Activity comment:  Decreased   LV Function: depressed (30 - 50%)     IVC: normal   Impression:    Impression: decreased contractility   Ultrasound ED Thoracic  Date/Time: 10/03/2020 9:03 PM Performed by: KBreck Coons MD Authorized by: KBreck Coons MD   Procedure details:    Indications: chest pain     Assessment for:  Pneumothorax   Left lung pleural:  Visualized   Right lung pleural:  Visualized   Images: archived   Findings:    B-lines noted throughout: identified   Right Lung Findings:     right lung sliding Left Lung Findings:     left lung sliding Impression:    Impression: pulmonary edema   .Critical Care Performed by: KBreck Coons MD Authorized by: KBreck Coons MD   Critical care provider statement:    Critical care time (minutes):  60   Critical care was necessary to treat or prevent imminent or life-threatening deterioration of the following conditions:  Cardiac failure and respiratory failure   Critical care was time spent personally  by me on the following activities:  Discussions with consultants, evaluation of patient's response to treatment, examination of patient, ordering and performing treatments and interventions, ordering and review of laboratory studies, ordering and review of radiographic studies, pulse oximetry, re-evaluation of patient's condition, obtaining history from patient or surrogate, review of old charts, blood draw for specimens and development of treatment plan with patient or surrogate   (including critical care time)  Medications Ordered in ED Medications  propofol (DIPRIVAN) 1000 MG/100ML infusion (20 mcg/kg/min  92.4 kg Intravenous Infusion Verify 10/03/20 2221)  norepinephrine (LEVOPHED) 68m in 258mpremix infusion (0 mcg/min  Intravenous Stopped 10/03/20 2156)  fentaNYL (SUBLIMAZE) injection 50 mcg (50 mcg Intravenous Given 10/03/20 2024)  etomidate (AMIDATE) injection 20 mg (20 mg Intravenous Given 10/03/20 2026)  rocuronium (ZEMURON) injection 100 mg (100 mg Intravenous Given 10/03/20 2027)  iohexol (OMNIPAQUE) 350 MG/ML injection 60 mL (60 mLs Intravenous Contrast Given 10/03/20 2125)    ED Course  I have reviewed the triage vital signs and the nursing notes.  Pertinent labs & imaging results that were available during my care of the patient were reviewed by me and considered in my medical decision making (see chart for details).    MDM Rules/Calculators/A&P                          [age]ear old male arrives via EMS status post PEA arrest, 1 round of epinephrine at 5 minutes of chest compressions and then return to spontaneous circulation.  The report is that he showed up to the fire department complaining of shortness of breath and chest pain and collapsed.  Family states he has had URI symptoms when taking Mucinex.  No sick contacts.  Upon arrival airway was intact with a King airway bilateral breath sounds present.  Ultrasound of the heart and lungs done by myself at bedside shows B-lines in the lungs consistent with possible pulmonary edema as well as decreased contractility.  Laboratory studies sent EKG performed at bedside shows no specific signs of STEMI or definitive ST changes, cardiac biomarkers will be sent.  Infectious work-up to include cultures and viral swab.  Though he is afebrile.  He has started with low blood pressure but started to wake up blood pressure climb we gave him fentanyl for discomfort, [facility] by PA HaPhylliss Bob See their note for procedure separately.  ET tube replacement went smoothly with color change bilateral breath sounds and visualization via video laryngoscopy.  Confirmatory chest x-ray afterwards looks with good tube placement, review of the chest x-ray  shows no focal opacity or significant lung disease.  We will wait for radiology review as well.  Screening labs show normal electrolytes on the rapid testing but will get formal chemistry panel.  Patient will get CT PE study, concerns with hypoxia chest pain and collapse within fairly unremarkable chest x-ray.  We will also scan his head to make sure there is no signs of trauma with collapse.  Fire department states the patient was laid down did not fall to the ground.  Patient is normothermic.  And was PEA arrest so no cooling will be initiated this time.  I informed the family what was going on and answered questions.  We will continue work this patient up.  Propofol infusion going to help keep the patient's comfortable.  Blood pressure is downtrending now that he is intubated norepinephrine infusion is started.  CT scan of  the chest for PE is negative for PE, some signs of right heart strain with contrast in the IVC, CT scan of the head read by radiology myself shows no acute cardiopulmonary pathology.  BNP is elevated but at baseline is markedly elevated.  Serum electrolytes and creatinine are fairly unremarkable.  Blood gas shows a compensated respiratory acidosis, lactic acidosis was elevated to 8.  Likely secondary to cardiac arrest.  Urine without signs of infection.  Troponin elevated but again cardiac arrest likely cause.  Unknown cause of the PEA arrest at this time.  Patient was on norepinephrine but we have been able to titrate this down.  Remains on propofol infusion for sedation. Viral testing is negative.  Critical care medicine is consulted for admission and further evaluation.  CRITICAL CARE Performed by: Breck Coons   Total critical care time: 60 minutes  Critical care time was exclusive of separately billable procedures and treating other patients.  Critical care was necessary to treat or prevent imminent or life-threatening deterioration.  Critical care was time spent personally  by me on the following activities: development of treatment plan with patient and/or surrogate as well as nursing, discussions with consultants, evaluation of patient's response to treatment, examination of patient, obtaining history from patient or surrogate, ordering and performing treatments and interventions, ordering and review of laboratory studies, ordering and review of radiographic studies, pulse oximetry and re-evaluation of patient's condition.  Final Clinical Impression(s) / ED Diagnoses Final diagnoses:  PEA (Pulseless electrical activity) ([place])  Cardiac arrest ([place])  SOB (shortness of breath)  Endotracheally intubated    Rx / DC Orders ED Discharge Orders    None       Breck Coons, MD 10/03/20 2227

## 2020-10-03 NOTE — Progress Notes (Signed)
Patient transported to CT and back without complications.  

## 2020-10-03 NOTE — ED Notes (Signed)
Requested phelbotomy to draw a purple top as requested by lab

## 2020-10-03 NOTE — ED Notes (Signed)
2 sets blood cultures at the bedside

## 2020-10-03 NOTE — ED Notes (Signed)
Pt is waking up, looking around, biting on tube

## 2020-10-04 ENCOUNTER — Telehealth: Payer: Self-pay

## 2020-10-04 ENCOUNTER — Inpatient Hospital Stay: Payer: Self-pay

## 2020-10-04 ENCOUNTER — Other Ambulatory Visit: Payer: Self-pay

## 2020-10-04 ENCOUNTER — Encounter (HOSPITAL_COMMUNITY): Payer: Self-pay | Admitting: Internal Medicine

## 2020-10-04 ENCOUNTER — Inpatient Hospital Stay (HOSPITAL_COMMUNITY): Payer: Medicare Other

## 2020-10-04 DIAGNOSIS — K5900 Constipation, unspecified: Secondary | ICD-10-CM | POA: Diagnosis present

## 2020-10-04 DIAGNOSIS — I469 Cardiac arrest, cause unspecified: Secondary | ICD-10-CM | POA: Diagnosis not present

## 2020-10-04 DIAGNOSIS — F419 Anxiety disorder, unspecified: Secondary | ICD-10-CM | POA: Diagnosis present

## 2020-10-04 DIAGNOSIS — I462 Cardiac arrest due to underlying cardiac condition: Secondary | ICD-10-CM | POA: Diagnosis not present

## 2020-10-04 DIAGNOSIS — I428 Other cardiomyopathies: Secondary | ICD-10-CM | POA: Diagnosis not present

## 2020-10-04 DIAGNOSIS — Z978 Presence of other specified devices: Secondary | ICD-10-CM | POA: Diagnosis not present

## 2020-10-04 DIAGNOSIS — J9811 Atelectasis: Secondary | ICD-10-CM | POA: Diagnosis not present

## 2020-10-04 DIAGNOSIS — I480 Paroxysmal atrial fibrillation: Secondary | ICD-10-CM | POA: Diagnosis not present

## 2020-10-04 DIAGNOSIS — Z91018 Allergy to other foods: Secondary | ICD-10-CM | POA: Diagnosis not present

## 2020-10-04 DIAGNOSIS — E1122 Type 2 diabetes mellitus with diabetic chronic kidney disease: Secondary | ICD-10-CM | POA: Diagnosis present

## 2020-10-04 DIAGNOSIS — J690 Pneumonitis due to inhalation of food and vomit: Secondary | ICD-10-CM | POA: Diagnosis not present

## 2020-10-04 DIAGNOSIS — E119 Type 2 diabetes mellitus without complications: Secondary | ICD-10-CM | POA: Diagnosis not present

## 2020-10-04 DIAGNOSIS — G9341 Metabolic encephalopathy: Secondary | ICD-10-CM | POA: Diagnosis not present

## 2020-10-04 DIAGNOSIS — E669 Obesity, unspecified: Secondary | ICD-10-CM | POA: Diagnosis present

## 2020-10-04 DIAGNOSIS — J440 Chronic obstructive pulmonary disease with acute lower respiratory infection: Secondary | ICD-10-CM | POA: Diagnosis not present

## 2020-10-04 DIAGNOSIS — J90 Pleural effusion, not elsewhere classified: Secondary | ICD-10-CM | POA: Diagnosis not present

## 2020-10-04 DIAGNOSIS — N179 Acute kidney failure, unspecified: Secondary | ICD-10-CM | POA: Diagnosis present

## 2020-10-04 DIAGNOSIS — N1831 Chronic kidney disease, stage 3a: Secondary | ICD-10-CM | POA: Diagnosis present

## 2020-10-04 DIAGNOSIS — J441 Chronic obstructive pulmonary disease with (acute) exacerbation: Secondary | ICD-10-CM | POA: Diagnosis not present

## 2020-10-04 DIAGNOSIS — J449 Chronic obstructive pulmonary disease, unspecified: Secondary | ICD-10-CM | POA: Diagnosis not present

## 2020-10-04 DIAGNOSIS — Z888 Allergy status to other drugs, medicaments and biological substances status: Secondary | ICD-10-CM | POA: Diagnosis not present

## 2020-10-04 DIAGNOSIS — G4733 Obstructive sleep apnea (adult) (pediatric): Secondary | ICD-10-CM | POA: Diagnosis not present

## 2020-10-04 DIAGNOSIS — Z20822 Contact with and (suspected) exposure to covid-19: Secondary | ICD-10-CM | POA: Diagnosis not present

## 2020-10-04 DIAGNOSIS — J9622 Acute and chronic respiratory failure with hypercapnia: Secondary | ICD-10-CM | POA: Diagnosis not present

## 2020-10-04 DIAGNOSIS — J9621 Acute and chronic respiratory failure with hypoxia: Secondary | ICD-10-CM | POA: Diagnosis not present

## 2020-10-04 DIAGNOSIS — R092 Respiratory arrest: Secondary | ICD-10-CM | POA: Diagnosis not present

## 2020-10-04 DIAGNOSIS — I130 Hypertensive heart and chronic kidney disease with heart failure and stage 1 through stage 4 chronic kidney disease, or unspecified chronic kidney disease: Secondary | ICD-10-CM | POA: Diagnosis not present

## 2020-10-04 DIAGNOSIS — J151 Pneumonia due to Pseudomonas: Secondary | ICD-10-CM | POA: Diagnosis present

## 2020-10-04 DIAGNOSIS — I5043 Acute on chronic combined systolic (congestive) and diastolic (congestive) heart failure: Secondary | ICD-10-CM | POA: Diagnosis not present

## 2020-10-04 DIAGNOSIS — Z9981 Dependence on supplemental oxygen: Secondary | ICD-10-CM | POA: Diagnosis not present

## 2020-10-04 DIAGNOSIS — Z9289 Personal history of other medical treatment: Secondary | ICD-10-CM | POA: Diagnosis not present

## 2020-10-04 DIAGNOSIS — R0902 Hypoxemia: Secondary | ICD-10-CM | POA: Diagnosis not present

## 2020-10-04 DIAGNOSIS — K219 Gastro-esophageal reflux disease without esophagitis: Secondary | ICD-10-CM | POA: Diagnosis present

## 2020-10-04 DIAGNOSIS — I10 Essential (primary) hypertension: Secondary | ICD-10-CM | POA: Diagnosis not present

## 2020-10-04 DIAGNOSIS — J811 Chronic pulmonary edema: Secondary | ICD-10-CM | POA: Diagnosis not present

## 2020-10-04 LAB — POCT I-STAT 7, (LYTES, BLD GAS, ICA,H+H)
Acid-Base Excess: 17 mmol/L — ABNORMAL HIGH (ref 0.0–2.0)
Acid-Base Excess: 12 mmol/L — ABNORMAL HIGH (ref 0.0–2.0)
Bicarbonate: 41.1 mmol/L — ABNORMAL HIGH (ref 20.0–28.0)
Bicarbonate: 38.4 mmol/L — ABNORMAL HIGH (ref 20.0–28.0)
Calcium, Ion: 1.12 mmol/L — ABNORMAL LOW (ref 1.15–1.40)
Calcium, Ion: 1.16 mmol/L (ref 1.15–1.40)
HCT: 29 % — ABNORMAL LOW (ref 39.0–52.0)
HCT: 28 % — ABNORMAL LOW (ref 39.0–52.0)
Hemoglobin: 9.9 g/dL — ABNORMAL LOW (ref 13.0–17.0)
Hemoglobin: 9.5 g/dL — ABNORMAL LOW (ref 13.0–17.0)
O2 Saturation: 99 %
O2 Saturation: 95 %
Patient temperature: 98
Potassium: 3 mmol/L — ABNORMAL LOW (ref 3.5–5.1)
Potassium: 2.9 mmol/L — ABNORMAL LOW (ref 3.5–5.1)
Sodium: 140 mmol/L (ref 135–145)
Sodium: 139 mmol/L (ref 135–145)
TCO2: 43 mmol/L — ABNORMAL HIGH (ref 22–32)
TCO2: 40 mmol/L — ABNORMAL HIGH (ref 22–32)
pCO2 arterial: 47.6 mmHg (ref 32.0–48.0)
pCO2 arterial: 63.6 mmHg — ABNORMAL HIGH (ref 32.0–48.0)
pH, Arterial: 7.543 — ABNORMAL HIGH (ref 7.350–7.450)
pH, Arterial: 7.389 (ref 7.350–7.450)
pO2, Arterial: 130 mmHg — ABNORMAL HIGH (ref 83.0–108.0)
pO2, Arterial: 80 mmHg — ABNORMAL LOW (ref 83.0–108.0)

## 2020-10-04 LAB — GLUCOSE, CAPILLARY
Glucose-Capillary: 203 mg/dL — ABNORMAL HIGH (ref 70–99)
Glucose-Capillary: 151 mg/dL — ABNORMAL HIGH (ref 70–99)
Glucose-Capillary: 93 mg/dL (ref 70–99)
Glucose-Capillary: 111 mg/dL — ABNORMAL HIGH (ref 70–99)
Glucose-Capillary: 96 mg/dL (ref 70–99)
Glucose-Capillary: 168 mg/dL — ABNORMAL HIGH (ref 70–99)

## 2020-10-04 LAB — ECHOCARDIOGRAM COMPLETE
AR max vel: 1.97 cm2
AV Area VTI: 1.83 cm2
AV Area mean vel: 1.76 cm2
AV Mean grad: 15.4 mmHg
AV Peak grad: 27.7 mmHg
Ao pk vel: 2.63 m/s
Area-P 1/2: 3.77 cm2
P 1/2 time: 521 msec
S' Lateral: 4.5 cm
Weight: 3259.28 oz

## 2020-10-04 LAB — MAGNESIUM: Magnesium: 2.7 mg/dL — ABNORMAL HIGH (ref 1.7–2.4)

## 2020-10-04 LAB — CBC
HCT: 32 % — ABNORMAL LOW (ref 39.0–52.0)
Hemoglobin: 9.9 g/dL — ABNORMAL LOW (ref 13.0–17.0)
MCH: 28.4 pg (ref 26.0–34.0)
MCHC: 30.9 g/dL (ref 30.0–36.0)
MCV: 92 fL (ref 80.0–100.0)
Platelets: 225 10*3/uL (ref 150–400)
RBC: 3.48 MIL/uL — ABNORMAL LOW (ref 4.22–5.81)
RDW: 13.4 % (ref 11.5–15.5)
WBC: 8.4 10*3/uL (ref 4.0–10.5)
nRBC: 0 % (ref 0.0–0.2)

## 2020-10-04 LAB — BASIC METABOLIC PANEL
Anion gap: 10 (ref 5–15)
BUN: 13 mg/dL (ref 6–20)
CO2: 36 mmol/L — ABNORMAL HIGH (ref 22–32)
Calcium: 8.6 mg/dL — ABNORMAL LOW (ref 8.9–10.3)
Chloride: 94 mmol/L — ABNORMAL LOW (ref 98–111)
Creatinine, Ser: 1.66 mg/dL — ABNORMAL HIGH (ref 0.61–1.24)
GFR, Estimated: 47 mL/min — ABNORMAL LOW (ref >60)
Glucose, Bld: 140 mg/dL — ABNORMAL HIGH (ref 70–99)
Potassium: 3.4 mmol/L — ABNORMAL LOW (ref 3.5–5.1)
Sodium: 140 mmol/L (ref 135–145)

## 2020-10-04 LAB — PHOSPHORUS: Phosphorus: 2.3 mg/dL — ABNORMAL LOW (ref 2.5–4.6)

## 2020-10-04 LAB — MRSA PCR SCREENING: MRSA by PCR: NEGATIVE

## 2020-10-04 LAB — CBG MONITORING, ED: Glucose-Capillary: 214 mg/dL — ABNORMAL HIGH (ref 70–99)

## 2020-10-04 MED ORDER — FENTANYL CITRATE (PF) 100 MCG/2ML IJ SOLN
50.0000 ug | Freq: Once | INTRAMUSCULAR | Status: AC
  Administered 2020-10-04: 50 ug via INTRAVENOUS
  Filled 2020-10-04: qty 2

## 2020-10-04 MED ORDER — DEXMEDETOMIDINE HCL IN NACL 400 MCG/100ML IV SOLN
0.4000 ug/kg/h | INTRAVENOUS | Status: DC
  Administered 2020-10-04: 1.2 ug/kg/h via INTRAVENOUS
  Administered 2020-10-04: 0.4 ug/kg/h via INTRAVENOUS
  Administered 2020-10-04 (×2): 1.2 ug/kg/h via INTRAVENOUS
  Administered 2020-10-05: 0.5 ug/kg/h via INTRAVENOUS
  Administered 2020-10-05 (×4): 1.2 ug/kg/h via INTRAVENOUS
  Filled 2020-10-04 (×7): qty 100

## 2020-10-04 MED ORDER — ALBUTEROL SULFATE (2.5 MG/3ML) 0.083% IN NEBU
2.5000 mg | INHALATION_SOLUTION | RESPIRATORY_TRACT | Status: DC | PRN
  Administered 2020-10-07 – 2020-10-11 (×4): 2.5 mg via RESPIRATORY_TRACT
  Filled 2020-10-04 (×4): qty 3

## 2020-10-04 MED ORDER — NOREPINEPHRINE 4 MG/250ML-% IV SOLN: 2.0000 ug/min | INTRAVENOUS | Status: DC

## 2020-10-04 MED ORDER — POTASSIUM CHLORIDE 20 MEQ PO PACK
20.0000 meq | PACK | Freq: Once | ORAL | Status: AC
  Administered 2020-10-04: 20 meq
  Filled 2020-10-04: qty 1

## 2020-10-04 MED ORDER — FUROSEMIDE 10 MG/ML IJ SOLN
INTRAMUSCULAR | Status: AC
  Filled 2020-10-04: qty 4

## 2020-10-04 MED ORDER — FAMOTIDINE IN NACL 20-0.9 MG/50ML-% IV SOLN
20.0000 mg | Freq: Two times a day (BID) | INTRAVENOUS | Status: DC
  Administered 2020-10-04 – 2020-10-05 (×3): 20 mg via INTRAVENOUS
  Filled 2020-10-04 (×3): qty 50

## 2020-10-04 MED ORDER — DOCUSATE SODIUM 100 MG PO CAPS: 100.0000 mg | ORAL_CAPSULE | Freq: Two times a day (BID) | ORAL | Status: DC | PRN

## 2020-10-04 MED ORDER — CHLORHEXIDINE GLUCONATE 0.12% ORAL RINSE (MEDLINE KIT)
15.0000 mL | Freq: Two times a day (BID) | OROMUCOSAL | Status: DC
  Administered 2020-10-04 – 2020-10-05 (×3): 15 mL via OROMUCOSAL

## 2020-10-04 MED ORDER — CALCIUM GLUCONATE-NACL 1-0.675 GM/50ML-% IV SOLN
1.0000 g | Freq: Once | INTRAVENOUS | Status: AC
  Administered 2020-10-04: 1000 mg via INTRAVENOUS
  Filled 2020-10-04: qty 50

## 2020-10-04 MED ORDER — LACTATED RINGERS IV BOLUS
1000.0000 mL | Freq: Once | INTRAVENOUS | Status: AC
  Administered 2020-10-04: 1000 mL via INTRAVENOUS

## 2020-10-04 MED ORDER — FUROSEMIDE 10 MG/ML IJ SOLN
40.0000 mg | Freq: Once | INTRAMUSCULAR | Status: AC
  Administered 2020-10-04: 40 mg via INTRAVENOUS

## 2020-10-04 MED ORDER — FENTANYL 2500MCG IN NS 250ML (10MCG/ML) PREMIX INFUSION
50.0000 ug/h | INTRAVENOUS | Status: DC
  Administered 2020-10-04 – 2020-10-05 (×2): 200 ug/h via INTRAVENOUS
  Filled 2020-10-04 (×2): qty 250

## 2020-10-04 MED ORDER — CHLORHEXIDINE GLUCONATE CLOTH 2 % EX PADS
6.0000 | MEDICATED_PAD | Freq: Every day | CUTANEOUS | Status: DC
  Administered 2020-10-04 – 2020-10-10 (×8): 6 via TOPICAL

## 2020-10-04 MED ORDER — FENTANYL BOLUS VIA INFUSION
50.0000 ug | INTRAVENOUS | Status: DC | PRN
  Administered 2020-10-04 (×3): 50 ug via INTRAVENOUS
  Filled 2020-10-04: qty 50

## 2020-10-04 MED ORDER — SODIUM CHLORIDE 0.9 % IV SOLN
INTRAVENOUS | Status: DC | PRN
  Administered 2020-10-05 – 2020-10-07 (×3): 500 mL via INTRAVENOUS

## 2020-10-04 MED ORDER — POLYETHYLENE GLYCOL 3350 17 G PO PACK: 17.0000 g | PACK | Freq: Every day | ORAL | Status: DC | PRN

## 2020-10-04 MED ORDER — FENTANYL 2500MCG IN NS 250ML (10MCG/ML) PREMIX INFUSION
0.0000 ug/h | INTRAVENOUS | Status: DC
  Administered 2020-10-04: 100 ug/h via INTRAVENOUS
  Filled 2020-10-04: qty 250

## 2020-10-04 MED ORDER — INSULIN ASPART 100 UNIT/ML ~~LOC~~ SOLN
0.0000 [IU] | SUBCUTANEOUS | Status: DC
  Administered 2020-10-04: 5 [IU] via SUBCUTANEOUS
  Administered 2020-10-04 (×2): 3 [IU] via SUBCUTANEOUS
  Administered 2020-10-05: 2 [IU] via SUBCUTANEOUS
  Administered 2020-10-05: 8 [IU] via SUBCUTANEOUS
  Administered 2020-10-05 (×2): 3 [IU] via SUBCUTANEOUS
  Administered 2020-10-05: 8 [IU] via SUBCUTANEOUS
  Administered 2020-10-05: 2 [IU] via SUBCUTANEOUS
  Administered 2020-10-05 – 2020-10-06 (×3): 3 [IU] via SUBCUTANEOUS
  Administered 2020-10-06: 5 [IU] via SUBCUTANEOUS
  Administered 2020-10-06: 2 [IU] via SUBCUTANEOUS
  Administered 2020-10-06 – 2020-10-07 (×3): 3 [IU] via SUBCUTANEOUS

## 2020-10-04 MED ORDER — SODIUM CHLORIDE 0.9 % IV SOLN: 250.0000 mL | INTRAVENOUS | Status: DC

## 2020-10-04 MED ORDER — APIXABAN 5 MG PO TABS
5.0000 mg | ORAL_TABLET | Freq: Two times a day (BID) | ORAL | Status: DC
  Administered 2020-10-04 – 2020-10-05 (×3): 5 mg
  Filled 2020-10-04 (×4): qty 1

## 2020-10-04 MED ORDER — ORAL CARE MOUTH RINSE
15.0000 mL | OROMUCOSAL | Status: DC
  Administered 2020-10-04 – 2020-10-05 (×16): 15 mL via OROMUCOSAL

## 2020-10-04 MED ORDER — ARFORMOTEROL TARTRATE 15 MCG/2ML IN NEBU
15.0000 ug | INHALATION_SOLUTION | Freq: Two times a day (BID) | RESPIRATORY_TRACT | Status: DC
  Administered 2020-10-05 – 2020-10-10 (×11): 15 ug via RESPIRATORY_TRACT
  Filled 2020-10-04 (×12): qty 2

## 2020-10-04 MED ORDER — IPRATROPIUM-ALBUTEROL 0.5-2.5 (3) MG/3ML IN SOLN
3.0000 mL | RESPIRATORY_TRACT | Status: DC
  Administered 2020-10-04 – 2020-10-09 (×26): 3 mL via RESPIRATORY_TRACT
  Filled 2020-10-04 (×26): qty 3

## 2020-10-04 MED ORDER — BUDESONIDE 0.5 MG/2ML IN SUSP
0.5000 mg | Freq: Two times a day (BID) | RESPIRATORY_TRACT | Status: DC
  Administered 2020-10-05 – 2020-10-10 (×11): 0.5 mg via RESPIRATORY_TRACT
  Filled 2020-10-04 (×12): qty 2

## 2020-10-04 NOTE — Chronic Care Management (AMB) (Signed)
10/04/2020- Placing acute fill forms for patient Buspirone and Arpiprazole to be delivered today but will place on hold due to patient being admitted to [facility] on 10/03/2020. Will recheck patient status tomorrow. Angelena Sole, CPP notified.   Billee Cashing, CMA Clinical Pharmacist Assistant [phone]

## 2020-10-04 NOTE — H&P (Signed)
NAME:  Kenneth [name], MRN:  [id number], DOB:  09/05/1961, LOS: 0 ADMISSION DATE:  10/03/2020, CONSULTATION DATE: 10/03/2020 REFERRING MD:  Breck Coons, MD, CHIEF COMPLAINT: Status post PEA cardiac arrest  Brief History:  60 year old male presented status post PEA cardiac arrest while he was at fire department.  ROSC was achieved after 5 minutes and received 1 round of epi.  History of Present Illness:  60 year old male with diabetes, hypertension, obesity and obstructive sleep apnea who was brought into the emergency department after he collapsed near fire department and was found to be in PEA arrest, ROSC was achieved after 5 minutes, he received 1 round of epinephrine. As per patient's wife he was in his usual state of health about few days ago when he started complaining of chest congestion and flulike symptoms, this evening he went to the fire department after he started with shortness of breath and chest pain and collapsed in front of it.  Past Medical History:  COPD CKD stage IIIa Obstructive sleep apnea Diabetes type 2 Hyperlipidemia Hypertension GERD Paroxysmal A. fib on apixaban  Significant Hospital Events:    Consults:  PCCM  Procedures:  ETT 1/12 >  Significant Diagnostic Tests:  1/12 CTA PE protocol: No evidence of pulmonary embolus. Reflux of contrast into the IVC and hepatic veins compatible with right heart dysfunction  1/12 CT head: No acute intracranial abnormality.  Micro Data:  1/12 influenza/COVID PCR negative  Antimicrobials:    Interim History / Subjective:    Objective   Blood pressure (!) 98/59, pulse 63, temperature 97.8 F (36.6 C), resp. rate 16, weight 92.4 kg, SpO2 100 %.    Vent Mode: PRVC FiO2 (%):  [40 %-100 %] 40 % Set Rate:  [16 bmp] 16 bmp Vt Set:  [620 mL] 620 mL PEEP:  [5 cmH20] 5 cmH20 Plateau Pressure:  [21 cmH20-23 cmH20] 23 cmH20   Intake/Output Summary (Last 24 hours) at 10/04/2020 0016 Last data filed at 10/03/2020  2248 Gross per 24 hour  Intake 25.24 ml  Output --  Net 25.24 ml   Filed Weights   10/03/20 2047  Weight: 92.4 kg    Examination: Physical exam: General: Middle-age African-American male, lying on the bed, orally intubated HEENT: [place]/AT, eyes anicteric.  ETT and OGT in place Neuro: Agitated, opens eyes but does not follow commands, moving all 4 extremities.  Pupils 2 mm bilateral sluggishly reactive to light, positive cough and gag Chest: Bilateral crackles all over, no wheezes or rhonchi Heart: Regular rate and rhythm, no murmurs or gallops Abdomen: Soft, nontender, nondistended, bowel sounds present Skin: No rash   Resolved Hospital Problem list     Assessment & Plan:  Status post PEA cardiac arrest, unclear etiology could be related to hypoxemia due to pulmonary edema Acute hypoxic/hypercapnic respiratory failure Obstructive sleep apnea/COPD, not in exacerbation Pulmonary edema due to acute on chronic diastolic heart failure CKD stage IIIa Diabetes type 2 Paroxysmal A. Fib   It is still unclear why patient collapsed and had a PEA cardiac arrest so far work-up has been negative except CT chest is suggestive of bilateral pulmonary edema. His last echocardiogram was done 3 weeks ago showing normal ejection fraction but some diastolic dysfunction On my bedside POCUS exam he has moderate LV dysfunction which could be due to myocardium stun from cardiac arrest His electrolytes are within normal limits He received 80 mg of Lasix in the emergency department Continue to monitor intake, output and daily weight Continue lung protective  ventilation, he was noted to have well compensated hypercapnia He has a history of obstructive sleep apnea and COPD Continue sedation with propofol and fentanyl with RASS goal -2 His CT head is negative for acute finding We will try SAT and SBT in the morning VAP prevention ordered Patient serum creatinine is at baseline, monitor closely Continue  sliding scale insulin with fingerstick goal of 140-180 Currently patient is in sinus rhythm, he is on apixaban for stroke prophylaxis Will resume apixaban as there are no signs of bleeding  Best practice (evaluated daily)  Diet: NPO Pain/Anxiety/Delirium protocol (if indicated): Propofol and fentanyl infusion VAP protocol (if indicated): Ordered DVT prophylaxis: Apixaban GI prophylaxis: Famotidine Glucose control: SSI Mobility: Bedrest for now Disposition: ICU  Goals of Care:  Last date of multidisciplinary goals of care discussion: Pending 1/13 Family and staff present:  Summary of discussion:  Follow up goals of care discussion due:  Code Status: Full code  Labs   CBC: Recent Labs  Lab 10/03/20 2020 10/03/20 2037 10/03/20 2139  WBC 14.9*  --   --   HGB 9.9* 11.9* 9.5*  HCT 34.1* 35.0* 28.0*  MCV 95.3  --   --   PLT 213  --   --     Basic Metabolic Panel: Recent Labs  Lab 10/03/20 2020 10/03/20 2037 10/03/20 2139  NA 138 137 136  K 3.9 3.9 3.8  CL 93* 93*  --   CO2 27  --   --   GLUCOSE 261* 264*  --   BUN 10 11  --   CREATININE 1.78* 1.70*  --   CALCIUM 8.7*  --   --    GFR: Estimated Creatinine Clearance: 51.4 mL/min (A) (by C-G formula based on SCr of 1.7 mg/dL (H)). Recent Labs  Lab 10/03/20 2020 10/03/20 2023 10/03/20 2130  WBC 14.9*  --   --   LATICACIDVEN  --  8.8* 3.8*    Liver Function Tests: Recent Labs  Lab 10/03/20 2035  AST 86*  ALT 57*  ALKPHOS 81  BILITOT 0.3  PROT 6.2*  ALBUMIN 3.3*   No results for input(s): LIPASE, AMYLASE in the last 168 hours. No results for input(s): AMMONIA in the last 168 hours.  ABG    Component Value Date/Time   PHART 7.394 10/03/2020 2139   PCO2ART 68.1 (HH) 10/03/2020 2139   PO2ART 501 (H) 10/03/2020 2139   HCO3 42.1 (H) 10/03/2020 2139   TCO2 44 (H) 10/03/2020 2139   ACIDBASEDEF 1.0 06/12/2019 1347   O2SAT 100.0 10/03/2020 2139     Coagulation Profile: No results for input(s): INR,  PROTIME in the last 168 hours.  Cardiac Enzymes: No results for input(s): CKTOTAL, CKMB, CKMBINDEX, TROPONINI in the last 168 hours.  HbA1C: Hgb A1c MFr Bld  Date/Time Value Ref Range Status  09/13/2020 04:08 AM 6.3 (H) 4.8 - 5.6 % Final    Comment:    (NOTE) Pre diabetes:          5.7%-6.4%  Diabetes:              >6.4%  Glycemic control for   <7.0% adults with diabetes   06/21/2020 09:14 AM 6.8 (H) 4.8 - 5.6 % Final    Comment:    (NOTE) Pre diabetes:          5.7%-6.4%  Diabetes:              >6.4%  Glycemic control for   <7.0% adults with diabetes  CBG: Recent Labs  Lab 10/03/20 2013  GLUCAP 247*    Review of Systems:   Unable to obtain as patient is intubated and sedated  Past Medical History:  He,  has a past medical history of Anxiety, Arthritis, Atrial fibrillation ([place]), CHF (congestive heart failure) ([place]), COPD (chronic obstructive pulmonary disease) ([place]), Depression, Diabetes mellitus without complication ([place]), Heart murmur, Hyperlipidemia, Hypertension, and Sleep apnea with use of continuous positive airway pressure (CPAP).   Surgical History:   Past Surgical History:  Procedure Laterality Date  . NO PAST SURGERIES       Social History:   reports that he quit smoking about 17 months ago. His smoking use included cigarettes. He has a 60.00 pack-year smoking history. He has never used smokeless tobacco. He reports current alcohol use. He reports current drug use. Drug: Marijuana.   Family History:  His family history includes Coronary artery disease in his brother; Diabetes in his father, mother, and sister; Heart disease in his father, mother, and sister; Kidney disease in his sister; Liver disease in his maternal uncle.   Allergies Allergies  Allergen Reactions  . Lisinopril Swelling  . Other Other (See Comments)    Lettuce : rash  . Tomato Rash     Home Medications  Prior to Admission medications   Medication Sig Start Date End  Date Taking? Authorizing Provider  acetaminophen (TYLENOL) 325 MG tablet Take 650 mg by mouth every 6 (six) hours as needed for mild pain or headache.    [provider]  albuterol (PROVENTIL) (2.5 MG/3ML) 0.083% nebulizer solution INHALE 1 VIAL VIA NEBULIZER 5 TIMES DAILY AS NEEDED FOR WHEEZING OR FOR SHORTNESS OF BREATH Patient taking differently: Take 2.5 mg by nebulization See admin instructions. 5 times daily prn wheezing or shortness of breath 05/22/20   [name], MD  apixaban (ELIQUIS) 5 MG TABS tablet Take 1 tablet (5 mg total) by mouth 2 (two) times daily. 04/23/20   Buford Dresser, MD  ARIPiprazole (ABILIFY) 5 MG tablet Take 5 mg by mouth daily. 08/24/20   [provider]  Blood Glucose Monitoring Suppl (ONE TOUCH ULTRA 2) w/Device KIT Use to test blood sugars 1-2 times daily. 04/02/20   [name], MD  busPIRone (BUSPAR) 15 MG tablet Take 1 tablet (15 mg total) by mouth 2 (two) times daily. 09/06/19   [name], MD  carvedilol (COREG) 12.5 MG tablet Take 1 tablet (12.5 mg total) by mouth 2 (two) times daily with a meal. 04/23/20   Buford Dresser, MD  DM-GG & DM-APAP-CPM (CORICIDIN HBP DAY/NIGHT COLD) 10-20 &15-200-2 MG MISC Take 1 tablet by mouth every 12 (twelve) hours as needed (cold symptoms).    [provider]  famotidine (PEPCID) 20 MG tablet Take 20 mg by mouth daily.    [provider]  FLUoxetine (PROZAC) 20 MG capsule TAKE 3 CAPSULES BY MOUTH  DAILY Patient taking differently: Take 60 mg by mouth daily. 05/26/19   [name], MD  Fluticasone-Umeclidin-Vilant (TRELEGY ELLIPTA) 100-62.5-25 MCG/INH AEPB Inhale 1 puff into the lungs daily. 04/02/20   Collene Gobble, MD  furosemide (LASIX) 40 MG tablet Take 1 tablet (40 mg total) by mouth 2 (two) times daily. 08/02/20   Buford Dresser, MD  gabapentin (NEURONTIN) 300 MG capsule TAKE ONE CAPSULE BY MOUTH THREE TIMES DAILY Patient taking  differently: Take 300 mg by mouth 3 (three) times daily. 08/03/20   [name], MD  glucose blood [facility] ULTRA) test strip  USE TO TEST BLOOD SUGAR 1-2 TIMES DAILY 05/22/20   [name], MD  guaiFENesin (MUCINEX) 600 MG 12 hr tablet Take 1 tablet (600 mg total) by mouth 2 (two) times daily. 09/24/20   Regalado, Belkys A, MD  ipratropium-albuterol (DUONEB) 0.5-2.5 (3) MG/3ML SOLN Take 3 mLs by nebulization every 6 (six) hours as needed. 09/24/20   Regalado, Belkys A, MD  isosorbide mononitrate (IMDUR) 30 MG 24 hr tablet Take 30 mg by mouth daily. 07/04/20   [provider]  Lancets [facility] ULTRASOFT) lancets USE TO TEST BLOOD SUGAR 1-2 TIMES DAILY 05/22/20   [name], MD  lubiprostone ([name]) 8 MCG capsule TAKE 1 CAPSULE(8 MCG) BY MOUTH TWICE DAILY WITH A MEAL Patient taking differently: Take 8 mcg by mouth 2 (two) times daily with a meal. 04/30/20   Esterwood, Amy S, PA-C  Multiple Vitamin (MULTIVITAMIN WITH MINERALS) TABS tablet Take 1 tablet by mouth daily.    [provider]  nitroGLYCERIN (NITROSTAT) 0.4 MG SL tablet DISSOLVE 1 TABLET UNDER THE TONGUE EVERY 5 MINUTES AS&nbsp;&nbsp;NEEDED FOR CHEST PAIN. MAX&nbsp;&nbsp;OF 3 TABLETS IN 15 MINUTES. CALL 911 IF PAIN PERSISTS. Patient taking differently: Place 0.4 mg under the tongue every 5 (five) minutes as needed for chest pain. 07/25/20   [name], MD  pantoprazole (PROTONIX) 40 MG tablet TAKE ONE TABLET BY MOUTH EVERY MORNING Patient taking differently: Take 40 mg by mouth daily. 08/03/20   Esterwood, Amy S, PA-C  rosuvastatin (CRESTOR) 20 MG tablet Take 1 tablet (20 mg total) by mouth daily. 08/27/20   Dutch Quint B, FNP  traZODone (DESYREL) 50 MG tablet TAKE 1/2 TO 1 TABLET BY  MOUTH AT BEDTIME AS NEEDED  FOR SLEEP. Patient taking differently: Take 25-50 mg by mouth at bedtime as needed for sleep. 05/26/19   [name], MD     Total critical care time: 43  minutes  Performed by: [place] care time was exclusive of separately billable procedures and treating other patients.   Critical care was necessary to treat or prevent imminent or life-threatening deterioration.   Critical care was time spent personally by me on the following activities: development of treatment plan with patient and/or surrogate as well as nursing, discussions with consultants, evaluation of patient's response to treatment, examination of patient, obtaining history from patient or surrogate, ordering and performing treatments and interventions, ordering and review of laboratory studies, ordering and review of radiographic studies, pulse oximetry and re-evaluation of patient's condition.   Jacky Kindle MD [facility] Pager: [phone] Mobile: [phone]

## 2020-10-04 NOTE — Progress Notes (Signed)
  Echocardiogram 2D Echocardiogram has been performed.  Kenneth [name] M 10/04/2020, 2:46 PM

## 2020-10-04 NOTE — Progress Notes (Addendum)
  PCCM Interval Note   60 year old male admitted early this morning s/p PEA cardiac arrest with ROSC after 5 mins.    Hx COPD on 2L home O2, OSA/ CPAP, DMT2, HFpEF, HTN, HLD  This is his third admission since mid December 2021 for respiratory failure (required intubation in December) secondary to AECOPD +/- HF exacerbation.  No obvious etiology of PEA other than respiratory precipitated.  CXR clear.    Woke up overnight in ICU on propofol and fentanyl gtt- agitated, reaching for ETT/ purposeful movement.  Followed commands on wake up assessment this today.   Remains on NE UOP- oliguria  Stable sCr K 3.4 Remains afebrile, normal WBC  P:  1L LR bolus (for low UOP/ ongoing pressor requirement) Repeat TTE  Check RVP  Change MV rate 20 KCL 20 meq per tube x 1 ? If COPD severe enough he needs nocturnal BiPAP Adding duonebs, pulmicort, brovanna  Avoid fever  Change sedation over to precedex/ fentanyl, hopeful for possible extubation in am    Attempted to update SO/ fiance, [name], [phone], no answer     Posey Boyer, ACNP [facility] 10/04/2020, 3:26 PM

## 2020-10-04 NOTE — ED Notes (Signed)
chand at bedside, requested bite block from RT

## 2020-10-04 NOTE — Progress Notes (Signed)
Patient transported to 2H10.

## 2020-10-05 ENCOUNTER — Telehealth: Payer: Self-pay

## 2020-10-05 ENCOUNTER — Inpatient Hospital Stay (HOSPITAL_COMMUNITY): Payer: Medicare Other

## 2020-10-05 DIAGNOSIS — I469 Cardiac arrest, cause unspecified: Secondary | ICD-10-CM | POA: Diagnosis not present

## 2020-10-05 LAB — COMPREHENSIVE METABOLIC PANEL
ALT: 34 U/L (ref 0–44)
AST: 24 U/L (ref 15–41)
Albumin: 2.7 g/dL — ABNORMAL LOW (ref 3.5–5.0)
Alkaline Phosphatase: 58 U/L (ref 38–126)
Anion gap: 13 (ref 5–15)
BUN: 13 mg/dL (ref 6–20)
CO2: 28 mmol/L (ref 22–32)
Calcium: 8.5 mg/dL — ABNORMAL LOW (ref 8.9–10.3)
Chloride: 96 mmol/L — ABNORMAL LOW (ref 98–111)
Creatinine, Ser: 1.5 mg/dL — ABNORMAL HIGH (ref 0.61–1.24)
GFR, Estimated: 53 mL/min — ABNORMAL LOW (ref >60)
Glucose, Bld: 196 mg/dL — ABNORMAL HIGH (ref 70–99)
Potassium: 3 mmol/L — ABNORMAL LOW (ref 3.5–5.1)
Sodium: 137 mmol/L (ref 135–145)
Total Bilirubin: 0.7 mg/dL (ref 0.3–1.2)
Total Protein: 5.2 g/dL — ABNORMAL LOW (ref 6.5–8.1)

## 2020-10-05 LAB — BASIC METABOLIC PANEL
Anion gap: 20 — ABNORMAL HIGH (ref 5–15)
BUN: 15 mg/dL (ref 6–20)
CO2: 20 mmol/L — ABNORMAL LOW (ref 22–32)
Calcium: 8.3 mg/dL — ABNORMAL LOW (ref 8.9–10.3)
Chloride: 98 mmol/L (ref 98–111)
Creatinine, Ser: 1.72 mg/dL — ABNORMAL HIGH (ref 0.61–1.24)
GFR, Estimated: 45 mL/min — ABNORMAL LOW (ref >60)
Glucose, Bld: 262 mg/dL — ABNORMAL HIGH (ref 70–99)
Potassium: 4 mmol/L (ref 3.5–5.1)
Sodium: 138 mmol/L (ref 135–145)

## 2020-10-05 LAB — RESPIRATORY PANEL BY PCR

## 2020-10-05 LAB — GLUCOSE, CAPILLARY
Glucose-Capillary: 148 mg/dL — ABNORMAL HIGH (ref 70–99)
Glucose-Capillary: 148 mg/dL — ABNORMAL HIGH (ref 70–99)
Glucose-Capillary: 173 mg/dL — ABNORMAL HIGH (ref 70–99)
Glucose-Capillary: 183 mg/dL — ABNORMAL HIGH (ref 70–99)
Glucose-Capillary: 190 mg/dL — ABNORMAL HIGH (ref 70–99)
Glucose-Capillary: 251 mg/dL — ABNORMAL HIGH (ref 70–99)
Glucose-Capillary: 271 mg/dL — ABNORMAL HIGH (ref 70–99)

## 2020-10-05 LAB — CBC
HCT: 33.9 % — ABNORMAL LOW (ref 39.0–52.0)
Hemoglobin: 9.7 g/dL — ABNORMAL LOW (ref 13.0–17.0)
MCH: 27.6 pg (ref 26.0–34.0)
MCHC: 28.6 g/dL — ABNORMAL LOW (ref 30.0–36.0)
MCV: 96.3 fL (ref 80.0–100.0)
Platelets: 178 10*3/uL (ref 150–400)
RBC: 3.52 MIL/uL — ABNORMAL LOW (ref 4.22–5.81)
RDW: 13.6 % (ref 11.5–15.5)
WBC: 8.2 10*3/uL (ref 4.0–10.5)
nRBC: 0 % (ref 0.0–0.2)

## 2020-10-05 LAB — TRIGLYCERIDES: Triglycerides: 275 mg/dL — ABNORMAL HIGH (ref <150)

## 2020-10-05 LAB — POTASSIUM: Potassium: 5.6 mmol/L — ABNORMAL HIGH (ref 3.5–5.1)

## 2020-10-05 LAB — MAGNESIUM
Magnesium: 2.1 mg/dL (ref 1.7–2.4)
Magnesium: 1.6 mg/dL — ABNORMAL LOW (ref 1.7–2.4)

## 2020-10-05 LAB — PHOSPHORUS: Phosphorus: 4 mg/dL (ref 2.5–4.6)

## 2020-10-05 MED ORDER — HYDRALAZINE HCL 20 MG/ML IJ SOLN
INTRAMUSCULAR | Status: AC
  Administered 2020-10-05: 10 mg via INTRAVENOUS
  Filled 2020-10-05: qty 1

## 2020-10-05 MED ORDER — DILTIAZEM HCL-DEXTROSE 125-5 MG/125ML-% IV SOLN (PREMIX)
5.0000 mg/h | INTRAVENOUS | Status: DC
  Filled 2020-10-05: qty 125

## 2020-10-05 MED ORDER — GABAPENTIN 300 MG PO CAPS: 300.0000 mg | ORAL_CAPSULE | Freq: Two times a day (BID) | ORAL | Status: DC

## 2020-10-05 MED ORDER — ISOSORBIDE MONONITRATE 10 MG PO TABS
15.0000 mg | ORAL_TABLET | Freq: Two times a day (BID) | ORAL | Status: DC
  Administered 2020-10-05: 15 mg
  Filled 2020-10-05 (×3): qty 1.5

## 2020-10-05 MED ORDER — METHYLPREDNISOLONE SODIUM SUCC 40 MG IJ SOLR
40.0000 mg | Freq: Three times a day (TID) | INTRAMUSCULAR | Status: DC
  Administered 2020-10-05 – 2020-10-08 (×9): 40 mg via INTRAVENOUS
  Filled 2020-10-05 (×9): qty 1

## 2020-10-05 MED ORDER — DEXMEDETOMIDINE HCL IN NACL 400 MCG/100ML IV SOLN
0.4000 ug/kg/h | INTRAVENOUS | Status: DC
  Administered 2020-10-05 (×2): 2 ug/kg/h via INTRAVENOUS
  Administered 2020-10-06: 1 ug/kg/h via INTRAVENOUS
  Administered 2020-10-06: 1.6 ug/kg/h via INTRAVENOUS
  Administered 2020-10-06: 1.5 ug/kg/h via INTRAVENOUS
  Administered 2020-10-06: 1.6 ug/kg/h via INTRAVENOUS
  Administered 2020-10-06 (×2): 1.5 ug/kg/h via INTRAVENOUS
  Administered 2020-10-07: 2 ug/kg/h via INTRAVENOUS
  Administered 2020-10-07: 0.5 ug/kg/h via INTRAVENOUS
  Administered 2020-10-07: 1.4 ug/kg/h via INTRAVENOUS
  Administered 2020-10-07: 0.8 ug/kg/h via INTRAVENOUS
  Administered 2020-10-07 (×2): 1.4 ug/kg/h via INTRAVENOUS
  Administered 2020-10-08: 0.8 ug/kg/h via INTRAVENOUS
  Administered 2020-10-08 (×3): 2 ug/kg/h via INTRAVENOUS
  Administered 2020-10-08: 1.4 ug/kg/h via INTRAVENOUS
  Administered 2020-10-08: 2 ug/kg/h via INTRAVENOUS
  Filled 2020-10-05 (×4): qty 100
  Filled 2020-10-05: qty 200
  Filled 2020-10-05 (×6): qty 100
  Filled 2020-10-05: qty 200
  Filled 2020-10-05 (×5): qty 100
  Filled 2020-10-05: qty 200
  Filled 2020-10-05 (×2): qty 100

## 2020-10-05 MED ORDER — SODIUM CHLORIDE 0.9 % IV SOLN
2.0000 g | Freq: Every day | INTRAVENOUS | Status: DC
  Administered 2020-10-05 – 2020-10-06 (×2): 2 g via INTRAVENOUS
  Filled 2020-10-05: qty 20
  Filled 2020-10-05 (×2): qty 2

## 2020-10-05 MED ORDER — CARVEDILOL 12.5 MG PO TABS: 12.5000 mg | ORAL_TABLET | Freq: Two times a day (BID) | ORAL | Status: DC

## 2020-10-05 MED ORDER — VITAL HIGH PROTEIN PO LIQD: 1000.0000 mL | ORAL | Status: DC

## 2020-10-05 MED ORDER — FENTANYL CITRATE (PF) 100 MCG/2ML IJ SOLN
50.0000 ug | INTRAMUSCULAR | Status: DC | PRN
  Administered 2020-10-05 (×2): 50 ug via INTRAVENOUS
  Filled 2020-10-05 (×2): qty 2

## 2020-10-05 MED ORDER — POTASSIUM CHLORIDE 10 MEQ/100ML IV SOLN
10.0000 meq | INTRAVENOUS | Status: AC
  Administered 2020-10-05 (×4): 10 meq via INTRAVENOUS
  Filled 2020-10-05: qty 100

## 2020-10-05 MED ORDER — FUROSEMIDE 10 MG/ML IJ SOLN
40.0000 mg | Freq: Four times a day (QID) | INTRAMUSCULAR | Status: AC
  Administered 2020-10-05 (×2): 40 mg via INTRAVENOUS
  Filled 2020-10-05 (×2): qty 4

## 2020-10-05 MED ORDER — METHYLPREDNISOLONE SODIUM SUCC 40 MG IJ SOLR
40.0000 mg | Freq: Every day | INTRAMUSCULAR | Status: DC
  Administered 2020-10-05: 40 mg via INTRAVENOUS
  Filled 2020-10-05: qty 1

## 2020-10-05 MED ORDER — ORAL CARE MOUTH RINSE
15.0000 mL | Freq: Two times a day (BID) | OROMUCOSAL | Status: DC
  Administered 2020-10-06 – 2020-10-12 (×8): 15 mL via OROMUCOSAL

## 2020-10-05 MED ORDER — MAGNESIUM SULFATE 4 GM/100ML IV SOLN
4.0000 g | Freq: Once | INTRAVENOUS | Status: AC
  Administered 2020-10-05: 4 g via INTRAVENOUS
  Filled 2020-10-05: qty 100

## 2020-10-05 MED ORDER — VITAL AF 1.2 CAL PO LIQD: 1000.0000 mL | ORAL | Status: DC

## 2020-10-05 MED ORDER — ARIPIPRAZOLE 5 MG PO TABS
5.0000 mg | ORAL_TABLET | Freq: Every day | ORAL | Status: DC
  Administered 2020-10-05: 5 mg
  Filled 2020-10-05 (×2): qty 1

## 2020-10-05 MED ORDER — BUSPIRONE HCL 10 MG PO TABS
15.0000 mg | ORAL_TABLET | Freq: Two times a day (BID) | ORAL | Status: DC
  Administered 2020-10-05: 15 mg
  Filled 2020-10-05: qty 2

## 2020-10-05 MED ORDER — ALBUTEROL (5 MG/ML) CONTINUOUS INHALATION SOLN
10.0000 mg/h | INHALATION_SOLUTION | RESPIRATORY_TRACT | Status: DC
  Administered 2020-10-05: 10 mg/h via RESPIRATORY_TRACT
  Filled 2020-10-05: qty 20

## 2020-10-05 MED ORDER — POLYETHYLENE GLYCOL 3350 17 G PO PACK: 17.0000 g | PACK | Freq: Every day | ORAL | Status: DC | PRN

## 2020-10-05 MED ORDER — PANTOPRAZOLE SODIUM 40 MG PO PACK
40.0000 mg | PACK | Freq: Every day | ORAL | Status: DC
  Administered 2020-10-05: 40 mg
  Filled 2020-10-05: qty 20

## 2020-10-05 MED ORDER — POTASSIUM CHLORIDE 20 MEQ PO PACK
20.0000 meq | PACK | ORAL | Status: AC
  Administered 2020-10-05 (×2): 20 meq
  Filled 2020-10-05 (×2): qty 1

## 2020-10-05 MED ORDER — FENTANYL CITRATE (PF) 100 MCG/2ML IJ SOLN
50.0000 ug | INTRAMUSCULAR | Status: DC | PRN
  Administered 2020-10-05 (×2): 100 ug via INTRAVENOUS
  Filled 2020-10-05 (×2): qty 2

## 2020-10-05 MED ORDER — DOCUSATE SODIUM 50 MG/5ML PO LIQD: 100.0000 mg | Freq: Two times a day (BID) | ORAL | Status: DC | PRN

## 2020-10-05 MED ORDER — HYDRALAZINE HCL 20 MG/ML IJ SOLN: 10.0000 mg | Freq: Once | INTRAMUSCULAR | Status: AC

## 2020-10-05 MED ORDER — FENTANYL BOLUS VIA INFUSION
25.0000 ug | INTRAVENOUS | Status: DC | PRN
  Filled 2020-10-05: qty 25

## 2020-10-05 MED ORDER — CHLORHEXIDINE GLUCONATE 0.12 % MT SOLN
15.0000 mL | Freq: Two times a day (BID) | OROMUCOSAL | Status: DC
  Administered 2020-10-06 – 2020-10-13 (×15): 15 mL via OROMUCOSAL
  Filled 2020-10-05 (×12): qty 15

## 2020-10-05 MED ORDER — ROSUVASTATIN CALCIUM 20 MG PO TABS
20.0000 mg | ORAL_TABLET | Freq: Every day | ORAL | Status: DC
  Administered 2020-10-05: 20 mg
  Filled 2020-10-05: qty 1

## 2020-10-05 MED ORDER — ISOSORBIDE MONONITRATE ER 30 MG PO TB24: 30.0000 mg | ORAL_TABLET | Freq: Every day | ORAL | Status: DC

## 2020-10-05 MED ORDER — FENTANYL 2500MCG IN NS 250ML (10MCG/ML) PREMIX INFUSION: 25.0000 ug/h | INTRAVENOUS | Status: DC

## 2020-10-05 MED ORDER — SODIUM CHLORIDE 0.9 % IV SOLN
500.0000 mg | INTRAVENOUS | Status: DC
  Administered 2020-10-05 – 2020-10-06 (×2): 500 mg via INTRAVENOUS
  Filled 2020-10-05 (×3): qty 500

## 2020-10-05 MED ORDER — FLUOXETINE HCL 20 MG PO CAPS
40.0000 mg | ORAL_CAPSULE | Freq: Every day | ORAL | Status: DC
  Administered 2020-10-05: 40 mg
  Filled 2020-10-05: qty 2

## 2020-10-05 MED ORDER — GABAPENTIN 250 MG/5ML PO SOLN
300.0000 mg | Freq: Two times a day (BID) | ORAL | Status: DC
  Administered 2020-10-05: 300 mg
  Filled 2020-10-05 (×3): qty 6

## 2020-10-05 MED ORDER — FENTANYL CITRATE (PF) 100 MCG/2ML IJ SOLN
25.0000 ug | Freq: Once | INTRAMUSCULAR | Status: AC
Start: 2020-10-05 — End: 2020-10-05
  Administered 2020-10-05: 25 ug via INTRAVENOUS
  Filled 2020-10-05: qty 2

## 2020-10-05 MED ORDER — DILTIAZEM LOAD VIA INFUSION
15.0000 mg | Freq: Once | INTRAVENOUS | Status: DC
  Filled 2020-10-05: qty 15

## 2020-10-05 NOTE — Progress Notes (Signed)
Initial Nutrition Assessment  DOCUMENTATION CODES:   Not applicable  INTERVENTION:   Tube Feeding via OG: Vital AF 1.2 at 65 ml/hr Provides 1872 kcals, 117 g of protein and 1264 mL of free water Meets 100% estimated calorie and protein needs  NUTRITION DIAGNOSIS:   Inadequate oral intake related to acute illness as evidenced by NPO status.  GOAL:   Patient will meet greater than or equal to 90% of their needs   MONITOR:   Vent status,Labs,Weight trends,TF tolerance  REASON FOR ASSESSMENT:   Consult,Ventilator Enteral/tube feeding initiation and management  ASSESSMENT:   60 yo male admitted with PEA arrest likely related to AECOPD requiring intubation, AKI on CKD. PMH COPD, CKD, DM, HLD, HTN, GERD  Pt sedated on vent support; on fentanyl and precedex. Failed weaning attempt today  Hyperkalemic after receiving KCL for potassium of 3.0   Unable to obtain diet and weight history at this time  Labs: potassium 5.6 (H)-pt receiving potassium supplement Meds: lasix, solumedrol, ss novolog, KCL    Diet Order:   Diet Order            Diet NPO time specified  Diet effective now                 EDUCATION NEEDS:   Not appropriate for education at this time  Skin:  Skin Assessment: Reviewed RN Assessment  Last BM:  PTA  Height:   Ht Readings from Last 1 Encounters:  10/04/20 6' (1.829 m)    Weight:   Wt Readings from Last 1 Encounters:  10/03/20 92.4 kg   BMI:  Body mass index is 27.63 kg/m.  Estimated Nutritional Needs:   Kcal:  1800-2000 kcals  Protein:  110-140 g  Fluid:  >/= 1.8 L  Romelle Starcher MS, RDN, LDN, CNSC Registered Dietitian III Clinical Nutrition RD Pager and On-Call Pager Number Located in [place]

## 2020-10-05 NOTE — Progress Notes (Signed)
eLink Physician-Brief Progress Note Patient Name: Kenneth [name] DOB: 27-Dec-1960 MRN: [id number]   Date of Service  10/05/2020  HPI/Events of Note  Patient reported by bedside RN to have been agitated on 2 mcg of Precedex earlier in the shift. On entering the room he is currently sleeping peacefully.  eICU Interventions  No intervention at this time.        Thomasene Lot Jentry Mcqueary 10/05/2020, 11:15 PM

## 2020-10-05 NOTE — Procedures (Signed)
Extubation Procedure Note  Patient Details:   Name: Kenneth [name] DOB: [birth year] MRN: [id number]   Airway Documentation:    Vent end date: 10/05/20 Vent end time: 1605   Evaluation  O2 sats: stable throughout Complications: No apparent complications Patient did tolerate procedure well. Bilateral Breath Sounds: Diminished   Yes   Patient extubated to bipap per MD order.  Positive cuff leak noted.  No evidence of stridor.  Patient able to speak post extubation.  Sats and vitals currently stable.  Will continue to monitor.   Elyn Peers 10/05/2020, 4:20 PM

## 2020-10-05 NOTE — Progress Notes (Signed)
E-link notified for Mag 1.6 and pt agitation and restlessness on Dex gtt 2

## 2020-10-05 NOTE — Progress Notes (Signed)
NAME:  Plato Alspaugh, MRN:  [id number], DOB:  08-Mar-1961, LOS: 1 ADMISSION DATE:  10/03/2020, CONSULTATION DATE: 10/03/2020 REFERRING MD:  Sabino Donovan, MD, CHIEF COMPLAINT: Status post PEA cardiac arrest  Brief History:  60 year old male presented status post PEA cardiac arrest while he was at fire department.  ROSC was achieved after 5 minutes and received 1 round of epi.  History of Present Illness:  60 year old male with diabetes, hypertension, obesity and obstructive sleep apnea who was brought into the emergency department after he collapsed near fire department and was found to be in PEA arrest, ROSC was achieved after 5 minutes, he received 1 round of epinephrine. As per patient's wife he was in his usual state of health about few days ago when he started complaining of chest congestion and flulike symptoms, this evening he went to the fire department after he started with shortness of breath and chest pain and collapsed in front of it.  Past Medical History:  COPD CKD stage IIIa Obstructive sleep apnea Diabetes type 2 Hyperlipidemia Hypertension GERD Paroxysmal A. fib on apixaban  Pulmonary function testing May 02, 2020 showed FEV1 at 25%, ratio 42, FVC 48%, no significant bronchodilator response., Severe mid flow obstruction with reversibility, DLCO 50%.  2D echo April 07, 2020 EF 45 to 50%, normal pulmonary artery systolic pressure  Significant Hospital Events:  1/12 admitted   Consults:  PCCM  Procedures:  ETT 1/12 >  Significant Diagnostic Tests:  1/12 CTA PE protocol: No evidence of pulmonary embolus. Reflux of contrast into the IVC and hepatic veins compatible with right heart dysfunction  1/12 CT head: No acute intracranial abnormality.  1/13 TTE >> 1. Left ventricular ejection fraction, by estimation, is 55 to 60%. The left ventricle has normal function. The left ventricle has no regional wall motion abnormalities. There is mild left ventricular  hypertrophy. Left ventricular diastolic parameters  are indeterminate.  2. Right ventricular systolic function is normal. The right ventricular size is normal. Tricuspid regurgitation signal is inadequate for assessing PA pressure.  3. A small pericardial effusion is present.  4. The mitral valve is normal in structure. No evidence of mitral valve regurgitation.  5. The aortic valve was not well visualized. Aortic valve regurgitation is mild. Mild to moderate aortic valve stenosis. Vmax 2.8 m/s, MG , AVA 1.6 cm^2, DI 0.4   Micro Data:  1/12 influenza/COVID PCR negative 1/13 MRSA > neg 1/14 RVP >>neg 1/14 trach asp >>  Antimicrobials:  1/14 azithro >> 1/14 ceftriaxone >>  Interim History / Subjective:  No events overnight Off vasopressors  Objective   Blood pressure (!) 167/74, pulse (!) 54, temperature (!) 95 F (35 C), temperature source Axillary, resp. rate 20, height 6' (1.829 m), weight 92.4 kg, SpO2 100 %.    Vent Mode: PRVC FiO2 (%):  [40 %] 40 % Set Rate:  [16 bmp-25 bmp] 20 bmp Vt Set:  [540 mL] 540 mL PEEP:  [5 cmH20] 5 cmH20 Plateau Pressure:  [20 cmH20-34 cmH20] 34 cmH20   Intake/Output Summary (Last 24 hours) at 10/05/2020 0908 Last data filed at 10/05/2020 0800 Gross per 24 hour  Intake 3301.17 ml  Output 735 ml  Net 2566.17 ml   Filed Weights   10/03/20 2047  Weight: 92.4 kg    Examination: General:  Adult male sedated on MV  HEENT: MM pink/moist, ETT/ OGT, pupils 4/reactive Neuro: sedated, not f/c, will reach for ETT CV: rr, NSR/ SB, no murmur PULM:  MV supported breaths,  diffuse faint exp wheeze, rhonchi in right base GI: soft, bs active, foley  Extremities: warm/dry, no LE edema  Skin: no rashes   CXR: stable ETT, new RLL opacity, LLL atelectasis  Net +2.5L  Lab reviewed, K 3 s/p replete total 40 meq orally and 4 runs IV sCr 1.66-> 1.5 UOP picking up over last 12 hours, 655 ml/ 24 hr   Resolved Hospital Problem list      Assessment & Plan:  Status post PEA cardiac arrest thought to be related to hypoxemia/ acute on chronic respiratory failure, ? Flash pulmonary edema, CAP,  - continue supportive care - off vasopressors - TTE with improved EF 55-60% from 7/21 EF 45 to 50%, diastolic function indeterminate   Acute hypoxic/hypercapnic respiratory failure Obstructive sleep apnea COPD, possible AE CAP/ aspiration PNA Possible component of flash pulmonary edema   -Continue MV support, 8cc/kg IBW, rate 20 with goal Pplat <30 and DP<15  -VAP prevention protocol/ PPI -PAD protocol for sedation> minimize, d/c fentanyl gtt-> prn only, minimize precedex -wean FiO2 as able for SpO2 >92%  -hopeful to wake up and extubate today, would consider extubation to BiPAP  - continue BD- duonebs, prn albuterol, brovanna/ pulmicort - solumedrol 40mg  daily given ongoing wheezing  - lasix when able for even balance   Encephalopathy, metabolic - CTH neg - serial neuro exams, purposeful/ intermittent f/c, continue to trend   AKI on CKD stage IIIa, baseline sCr 1.3 -trend renal indices, improved today  -strict I/O's/ daily wts   Diabetes type 2 - SSI   Paroxysmal A. Fib - remains SR/ SB - continue eliquis   HTN, HFrEF/ HFpEF - restart home coreg, imdur, and crestor - awaiting improvement in K prior to dosing lasix  - TTE as above, no wall motion abnormalities, improved EF  Hypokalemia  - s/p replete, recheck K today 1100 - mag ok 2.1  Anxiety / depression - restart home Abilify, buspar, prozac, Neurontin  - hold home prn trazodone prn hs   Best practice (evaluated daily)  Diet: start TF if not extubated Pain/Anxiety/Delirium protocol (if indicated): precedex, prn fentanyl  VAP protocol (if indicated): Ordered DVT prophylaxis: Apixaban GI prophylaxis: Famotidine Glucose control: SSI Mobility: progress  Disposition: ICU  Goals of Care:  Last date of multidisciplinary goals of care discussion: Pending  1/13 Family and staff present:  Summary of discussion:  Follow up goals of care discussion due: pending; significant other/ fiance, Delores, updated by phone 1/14 and plan of care Code Status: Full code  Labs   CBC: Recent Labs  Lab 10/03/20 2020 10/03/20 2037 10/03/20 2139 10/04/20 0545 10/04/20 0552 10/04/20 1423 10/05/20 0126  WBC 14.9*  --   --  8.4  --   --  8.2  HGB 9.9*   < > 9.5* 9.9* 9.9* 9.5* 9.7*  HCT 34.1*   < > 28.0* 32.0* 29.0* 28.0* 33.9*  MCV 95.3  --   --  92.0  --   --  96.3  PLT 213  --   --  225  --   --  178   < > = values in this interval not displayed.    Basic Metabolic Panel: Recent Labs  Lab 10/03/20 2020 10/03/20 2037 10/03/20 2139 10/04/20 0545 10/04/20 0552 10/04/20 1423 10/05/20 0126  NA 138 137 136 140 140 139 137  K 3.9 3.9 3.8 3.4* 3.0* 2.9* 3.0*  CL 93* 93*  --  94*  --   --  96*  CO2 27  --   --  36*  --   --  28  GLUCOSE 261* 264*  --  140*  --   --  196*  BUN 10 11  --  13  --   --  13  CREATININE 1.78* 1.70*  --  1.66*  --   --  1.50*  CALCIUM 8.7*  --   --  8.6*  --   --  8.5*  MG  --   --   --  2.7*  --   --  2.1  PHOS  --   --   --  2.3*  --   --   --    GFR: Estimated Creatinine Clearance: 58.2 mL/min (A) (by C-G formula based on SCr of 1.5 mg/dL (H)). Recent Labs  Lab 10/03/20 2020 10/03/20 2023 10/03/20 2130 10/04/20 0545 10/05/20 0126  WBC 14.9*  --   --  8.4 8.2  LATICACIDVEN  --  8.8* 3.8*  --   --     Liver Function Tests: Recent Labs  Lab 10/03/20 2035 10/05/20 0126  AST 86* 24  ALT 57* 34  ALKPHOS 81 58  BILITOT 0.3 0.7  PROT 6.2* 5.2*  ALBUMIN 3.3* 2.7*   No results for input(s): LIPASE, AMYLASE in the last 168 hours. No results for input(s): AMMONIA in the last 168 hours.  ABG    Component Value Date/Time   PHART 7.389 10/04/2020 1423   PCO2ART 63.6 (H) 10/04/2020 1423   PO2ART 80 (L) 10/04/2020 1423   HCO3 38.4 (H) 10/04/2020 1423   TCO2 40 (H) 10/04/2020 1423   ACIDBASEDEF 1.0  06/12/2019 1347   O2SAT 95.0 10/04/2020 1423     Coagulation Profile: No results for input(s): INR, PROTIME in the last 168 hours.  Cardiac Enzymes: No results for input(s): CKTOTAL, CKMB, CKMBINDEX, TROPONINI in the last 168 hours.  HbA1C: Hgb A1c MFr Bld  Date/Time Value Ref Range Status  09/13/2020 04:08 AM 6.3 (H) 4.8 - 5.6 % Final    Comment:    (NOTE) Pre diabetes:          5.7%-6.4%  Diabetes:              >6.4%  Glycemic control for   <7.0% adults with diabetes   06/21/2020 09:14 AM 6.8 (H) 4.8 - 5.6 % Final    Comment:    (NOTE) Pre diabetes:          5.7%-6.4%  Diabetes:              >6.4%  Glycemic control for   <7.0% adults with diabetes     CBG: Recent Labs  Lab 10/04/20 1533 10/04/20 1936 10/05/20 0033 10/05/20 0339 10/05/20 0737  GLUCAP 96 168* 190* 173* 148*    Total critical care time: 30 minutes   Posey Boyer, ACNP [facility] 10/05/2020, 9:08 AM

## 2020-10-05 NOTE — Progress Notes (Signed)
Left voice message to confirmed patient telephone appointment on 10/08/2020 for CCM at 11:00 am with Angelena Sole the Clinical pharmacist.     Everlean Cherry Clinical Pharmacist Assistant [phone]

## 2020-10-05 NOTE — Progress Notes (Signed)
[facility] ADULT ICU REPLACEMENT PROTOCOL   The patient does apply for the [facility] Adult ICU Electrolyte Replacment Protocol based on the criteria listed below:   1. Is GFR >/= 30 ml/min? Yes.    Patient's GFR today is 53 2. Is SCr </= 2? Yes.   Patient's SCr is 1.5 ml/kg/hr 3. Did SCr increase >/= 0.5 in 24 hours? No. 4. Abnormal electrolyte(s): k 3.0 5. Ordered repletion with: protocol 6. If a panic level lab has been reported, has the CCM MD in charge been notified? No..   Physician:    Markus Daft A 10/05/2020 4:29 AM

## 2020-10-05 NOTE — Progress Notes (Signed)
[facility] ADULT ICU REPLACEMENT PROTOCOL   The patient does apply for the [facility] Adult ICU Electrolyte Replacment Protocol based on the criteria listed below:   1. Is GFR >/= 30 ml/min? Yes.    Patient's GFR today is 45 2. Is SCr </= 2? Yes.   Patient's SCr is 1.72 ml/kg/hr 3. Did SCr increase >/= 0.5 in 24 hours? No. 4. Abnormal electrolyte(s): Mag 1.6 5. Ordered repletion with: protocol 6. If a panic level lab has been reported, has the CCM MD in charge been notified? No..   Physician:  Not previously replaced  [facility], Juergen Hardenbrook A 10/05/2020 10:37 PM

## 2020-10-06 ENCOUNTER — Inpatient Hospital Stay (HOSPITAL_COMMUNITY): Payer: Medicare Other

## 2020-10-06 DIAGNOSIS — I469 Cardiac arrest, cause unspecified: Secondary | ICD-10-CM | POA: Diagnosis not present

## 2020-10-06 LAB — POCT I-STAT 7, (LYTES, BLD GAS, ICA,H+H)
Acid-Base Excess: 5 mmol/L — ABNORMAL HIGH (ref 0.0–2.0)
Bicarbonate: 32 mmol/L — ABNORMAL HIGH (ref 20.0–28.0)
Calcium, Ion: 1.15 mmol/L (ref 1.15–1.40)
HCT: 30 % — ABNORMAL LOW (ref 39.0–52.0)
Hemoglobin: 10.2 g/dL — ABNORMAL LOW (ref 13.0–17.0)
O2 Saturation: 83 %
Patient temperature: 37.6
Potassium: 4.5 mmol/L (ref 3.5–5.1)
Sodium: 137 mmol/L (ref 135–145)
TCO2: 34 mmol/L — ABNORMAL HIGH (ref 22–32)
pCO2 arterial: 58.5 mmHg — ABNORMAL HIGH (ref 32.0–48.0)
pH, Arterial: 7.349 — ABNORMAL LOW (ref 7.350–7.450)
pO2, Arterial: 53 mmHg — ABNORMAL LOW (ref 83.0–108.0)

## 2020-10-06 LAB — BASIC METABOLIC PANEL
Anion gap: 14 (ref 5–15)
BUN: 16 mg/dL (ref 6–20)
CO2: 26 mmol/L (ref 22–32)
Calcium: 8.8 mg/dL — ABNORMAL LOW (ref 8.9–10.3)
Chloride: 99 mmol/L (ref 98–111)
Creatinine, Ser: 1.62 mg/dL — ABNORMAL HIGH (ref 0.61–1.24)
GFR, Estimated: 49 mL/min — ABNORMAL LOW (ref >60)
Glucose, Bld: 250 mg/dL — ABNORMAL HIGH (ref 70–99)
Potassium: 4.1 mmol/L (ref 3.5–5.1)
Sodium: 139 mmol/L (ref 135–145)

## 2020-10-06 LAB — GLUCOSE, CAPILLARY
Glucose-Capillary: 156 mg/dL — ABNORMAL HIGH (ref 70–99)
Glucose-Capillary: 126 mg/dL — ABNORMAL HIGH (ref 70–99)
Glucose-Capillary: 163 mg/dL — ABNORMAL HIGH (ref 70–99)
Glucose-Capillary: 203 mg/dL — ABNORMAL HIGH (ref 70–99)
Glucose-Capillary: 201 mg/dL — ABNORMAL HIGH (ref 70–99)
Glucose-Capillary: 159 mg/dL — ABNORMAL HIGH (ref 70–99)

## 2020-10-06 LAB — CBC
HCT: 32.7 % — ABNORMAL LOW (ref 39.0–52.0)
Hemoglobin: 9.7 g/dL — ABNORMAL LOW (ref 13.0–17.0)
MCH: 27.8 pg (ref 26.0–34.0)
MCHC: 29.7 g/dL — ABNORMAL LOW (ref 30.0–36.0)
MCV: 93.7 fL (ref 80.0–100.0)
Platelets: 193 10*3/uL (ref 150–400)
RBC: 3.49 MIL/uL — ABNORMAL LOW (ref 4.22–5.81)
RDW: 13.6 % (ref 11.5–15.5)
WBC: 12.6 10*3/uL — ABNORMAL HIGH (ref 4.0–10.5)
nRBC: 0 % (ref 0.0–0.2)

## 2020-10-06 LAB — MAGNESIUM: Magnesium: 2.9 mg/dL — ABNORMAL HIGH (ref 1.7–2.4)

## 2020-10-06 LAB — PHOSPHORUS: Phosphorus: 3.1 mg/dL (ref 2.5–4.6)

## 2020-10-06 MED ORDER — APIXABAN 5 MG PO TABS: 5.0000 mg | ORAL_TABLET | Freq: Two times a day (BID) | ORAL | Status: DC

## 2020-10-06 MED ORDER — LORAZEPAM 2 MG/ML IJ SOLN
INTRAMUSCULAR | Status: AC
  Filled 2020-10-06: qty 1

## 2020-10-06 MED ORDER — ISOSORBIDE MONONITRATE ER 30 MG PO TB24: 15.0000 mg | ORAL_TABLET | Freq: Two times a day (BID) | ORAL | Status: DC

## 2020-10-06 MED ORDER — LABETALOL HCL 5 MG/ML IV SOLN
10.0000 mg | INTRAVENOUS | Status: DC | PRN
  Administered 2020-10-06: 10 mg via INTRAVENOUS
  Administered 2020-10-06: 20 mg via INTRAVENOUS
  Administered 2020-10-09: 10 mg via INTRAVENOUS
  Administered 2020-10-10: 20 mg via INTRAVENOUS
  Filled 2020-10-06 (×4): qty 4

## 2020-10-06 MED ORDER — CARVEDILOL 12.5 MG PO TABS: 12.5000 mg | ORAL_TABLET | Freq: Two times a day (BID) | ORAL | Status: DC

## 2020-10-06 MED ORDER — ENOXAPARIN SODIUM 100 MG/ML ~~LOC~~ SOLN
90.0000 mg | Freq: Two times a day (BID) | SUBCUTANEOUS | Status: DC
  Administered 2020-10-06 – 2020-10-10 (×9): 90 mg via SUBCUTANEOUS
  Filled 2020-10-06 (×9): qty 0.9

## 2020-10-06 MED ORDER — ROSUVASTATIN CALCIUM 20 MG PO TABS: 20.0000 mg | ORAL_TABLET | Freq: Every day | ORAL | Status: DC

## 2020-10-06 MED ORDER — BUSPIRONE HCL 10 MG PO TABS: 15.0000 mg | ORAL_TABLET | Freq: Two times a day (BID) | ORAL | Status: DC

## 2020-10-06 MED ORDER — LORAZEPAM 2 MG/ML IJ SOLN
2.0000 mg | Freq: Once | INTRAMUSCULAR | Status: AC
  Administered 2020-10-06: 2 mg via INTRAVENOUS

## 2020-10-06 MED ORDER — PANTOPRAZOLE SODIUM 40 MG IV SOLR
40.0000 mg | INTRAVENOUS | Status: DC
  Administered 2020-10-06 – 2020-10-09 (×4): 40 mg via INTRAVENOUS
  Filled 2020-10-06 (×5): qty 40

## 2020-10-06 MED ORDER — ARIPIPRAZOLE 5 MG PO TABS
5.0000 mg | ORAL_TABLET | Freq: Every day | ORAL | Status: DC
  Filled 2020-10-06: qty 1

## 2020-10-06 MED ORDER — HYDRALAZINE HCL 20 MG/ML IJ SOLN
10.0000 mg | INTRAMUSCULAR | Status: DC | PRN
  Administered 2020-10-06: 10 mg via INTRAVENOUS
  Administered 2020-10-08 – 2020-10-13 (×13): 20 mg via INTRAVENOUS
  Filled 2020-10-06 (×14): qty 1

## 2020-10-06 MED ORDER — LORAZEPAM 2 MG/ML IJ SOLN
0.5000 mg | Freq: Once | INTRAMUSCULAR | Status: AC
  Administered 2020-10-06: 0.5 mg via INTRAVENOUS

## 2020-10-06 MED ORDER — LABETALOL HCL 5 MG/ML IV SOLN
10.0000 mg | INTRAVENOUS | Status: DC | PRN
Start: 2020-10-06 — End: 2020-10-06
  Administered 2020-10-06: 10 mg via INTRAVENOUS
  Filled 2020-10-06: qty 4

## 2020-10-06 MED ORDER — MORPHINE SULFATE (PF) 2 MG/ML IV SOLN: 2.0000 mg | Freq: Once | INTRAVENOUS | Status: DC

## 2020-10-06 MED ORDER — GABAPENTIN 250 MG/5ML PO SOLN
300.0000 mg | Freq: Two times a day (BID) | ORAL | Status: DC
  Administered 2020-10-07 – 2020-10-10 (×6): 300 mg via ORAL
  Filled 2020-10-06 (×12): qty 6

## 2020-10-06 MED ORDER — FUROSEMIDE 10 MG/ML IJ SOLN
40.0000 mg | Freq: Four times a day (QID) | INTRAMUSCULAR | Status: AC
Start: 2020-10-06 — End: 2020-10-06
  Administered 2020-10-06 (×2): 40 mg via INTRAVENOUS
  Filled 2020-10-06 (×2): qty 4

## 2020-10-06 MED ORDER — PANTOPRAZOLE SODIUM 40 MG PO TBEC: 40.0000 mg | DELAYED_RELEASE_TABLET | Freq: Every day | ORAL | Status: DC

## 2020-10-06 MED ORDER — NICARDIPINE HCL IN NACL 20-0.86 MG/200ML-% IV SOLN
3.0000 mg/h | INTRAVENOUS | Status: DC
  Administered 2020-10-06: 5 mg/h via INTRAVENOUS
  Administered 2020-10-06: 3 mg/h via INTRAVENOUS
  Administered 2020-10-06: 5 mg/h via INTRAVENOUS
  Administered 2020-10-07 (×3): 3 mg/h via INTRAVENOUS
  Filled 2020-10-06 (×7): qty 200

## 2020-10-06 MED ORDER — CLONIDINE HCL 0.2 MG/24HR TD PTWK
0.2000 mg | MEDICATED_PATCH | TRANSDERMAL | Status: DC
  Administered 2020-10-06: 0.2 mg via TRANSDERMAL
  Filled 2020-10-06: qty 1

## 2020-10-06 NOTE — Progress Notes (Signed)
eLink Physician-Brief Progress Note Patient Name: Kenneth [name] DOB: March 16, 1961 MRN: [id number]   Date of Service  10/06/2020  HPI/Events of Note  Patient restless despite Precedex infusion at 2 mcg. He also stated that he takes Ambien nightly at home.  eICU Interventions  Precedex switched to an alternative iv and patient given 0.5 mg of Ativan with resolution of his agitation.        Thomasene Lot Ogan 10/06/2020, 5:07 AM

## 2020-10-06 NOTE — Progress Notes (Signed)
Pt irritated with BIPAP attempting to take off, now tolerating 5LNC Will continue to monitor and titrate down on Precedex as tolerated

## 2020-10-06 NOTE — Progress Notes (Signed)
eLink Physician-Brief Progress Note Patient Name: Kenneth [name] DOB: 04-07-1961 MRN: [id number]   Date of Service  10/06/2020  HPI/Events of Note  Labetalol 10 mg ineffective in controlling blood pressure.  eICU Interventions  Labetalol PRN dose changed to 10-20 mg .        Thomasene Lot Ogan 10/06/2020, 3:15 AM

## 2020-10-06 NOTE — Progress Notes (Addendum)
eLink Physician-Brief Progress Note Patient Name: Kenneth [name] DOB: 07-26-1961 MRN: [id number]   Date of Service  10/06/2020  HPI/Events of Note  Patient quite sedated with OSA symptoms, blood pressure 191/101.  eICU Interventions  Bedside RN instructed to wean Precedex, Hydralazine 10-20 mg iv Q 4 hours PRN  SBP > 160 mmHg.        [name] 10/06/2020, 6:01 AM

## 2020-10-06 NOTE — Progress Notes (Signed)
Hypertensive despite PRN medications. Appears agitated with BiPap and stable resp status. Will attempt off BiPap with close obs

## 2020-10-06 NOTE — Progress Notes (Signed)
eLink Physician-Brief Progress Note Patient Name: Kenneth [name] DOB: 1961-02-25 MRN: [id number]   Date of Service  10/06/2020  HPI/Events of Note  Elevated blood pressure, patient is on  Coreg at home but is currently NPO.  eICU Interventions  PRN iv Hydralazine ordered for SBP > 160.        Thomasene Lot Shaakira Borrero 10/06/2020, 12:59 AM

## 2020-10-06 NOTE — Progress Notes (Signed)
NAME:  Kenneth [name], MRN:  [id number], DOB:  12-Feb-1961, LOS: 2 ADMISSION DATE:  10/03/2020, CONSULTATION DATE: 10/03/2020 REFERRING MD:  Sabino Donovan, MD, CHIEF COMPLAINT: Status post PEA cardiac arrest  Brief History:  60 year old male presented status post PEA cardiac arrest while he was at fire department.  ROSC was achieved after 5 minutes and received 1 round of epi.  History of Present Illness:  60 year old male with diabetes, hypertension, obesity and obstructive sleep apnea who was brought into the emergency department after he collapsed near fire department and was found to be in PEA arrest, ROSC was achieved after 5 minutes, he received 1 round of epinephrine. As per patient's wife he was in his usual state of health about few days ago when he started complaining of chest congestion and flulike symptoms, this evening he went to the fire department after he started with shortness of breath and chest pain and collapsed in front of it.  Past Medical History:  COPD CKD stage IIIa Obstructive sleep apnea Diabetes type 2 Hyperlipidemia Hypertension GERD Paroxysmal A. fib on apixaban  Pulmonary function testing May 02, 2020 showed FEV1 at 25%, ratio 42, FVC 48%, no significant bronchodilator response., Severe mid flow obstruction with reversibility, DLCO 50%.  2D echo April 07, 2020 EF 45 to 50%, normal pulmonary artery systolic pressure  Significant Hospital Events:  1/12 admitted   Consults:  PCCM  Procedures:  ETT 1/12 >1/14  Significant Diagnostic Tests:  1/12 CTA PE protocol: No evidence of pulmonary embolus. Reflux of contrast into the IVC and hepatic veins compatible with right heart dysfunction  1/12 CT head: No acute intracranial abnormality.  1/13 TTE >> 1. Left ventricular ejection fraction, by estimation, is 55 to 60%. The left ventricle has normal function. The left ventricle has no regional wall motion abnormalities. There is mild left ventricular  hypertrophy. Left ventricular diastolic parameters  are indeterminate.  2. Right ventricular systolic function is normal. The right ventricular size is normal. Tricuspid regurgitation signal is inadequate for assessing PA pressure.  3. A small pericardial effusion is present.  4. The mitral valve is normal in structure. No evidence of mitral valve regurgitation.  5. The aortic valve was not well visualized. Aortic valve regurgitation is mild. Mild to moderate aortic valve stenosis. Vmax 2.8 m/s, MG , AVA 1.6 cm^2, DI 0.4   Micro Data:  1/12 influenza/COVID PCR negative 1/13 MRSA > neg 1/14 RVP >>neg 1/14 trach asp >>  Antimicrobials:  1/14 azithro >> 1/14 ceftriaxone >>  Interim History / Subjective:  Extubated yesterday but remains tenuous. Agitation and air trapping seem to remain an issue.  Objective   Blood pressure (!) 148/77, pulse 84, temperature 98.42 F (36.9 C), resp. rate 14, height 6' (1.829 m), weight 92.4 kg, SpO2 100 %.    Vent Mode: BIPAP FiO2 (%):  [30 %-60 %] 60 % Set Rate:  [12 bmp-20 bmp] 16 bmp Vt Set:  [540 mL] 540 mL PEEP:  [5 cmH20-8 cmH20] 8 cmH20 Pressure Support:  [6 cmH20-10 cmH20] 6 cmH20 Plateau Pressure:  [13 cmH20-19 cmH20] 19 cmH20   Intake/Output Summary (Last 24 hours) at 10/06/2020 1027 Last data filed at 10/06/2020 0925 Gross per 24 hour  Intake 1561.83 ml  Output 3235 ml  Net -1673.17 ml   Filed Weights   10/03/20 2047  Weight: 92.4 kg    Examination: Constitutional: ill appearing man sedated on BIPAP  Eyes: pupils equal, does track intertmittently Ears, nose, mouth, and throat: BIPAP  in place with good seal, trachea midline Cardiovascular: RRR, ext warm Respiratory: remains with poor air movement by auscultation but pulling good TV on BIPAP as his RR slows down with sedation Gastrointestinal: soft, hypoactive BS Skin: No rashes, normal turgor Neurologic: moves all 4 ext Psychiatric: RASS -2  ABG this AM looks  okay BMP okay CBG okay CBC mild leukocytosis CXR stable bilateral LL infiltrates  Resolved Hospital Problem list     Assessment & Plan:  OOH PEA arrest likely related to AECOPD, no obvious neurological sequelae on sedation wean  Infiltrates on CT: fluid vs. Aspiration vs. CAP, tx all 3  Underlying advanced COPD/OSA overlap syndrome with chronic CO2 retention, recurrent admissions, questionable CPAP compliance, also a question of asthma overlap complicated by panic attacks AKI on CKD vs. Just worsening CKD due to recurrent hospitalizations, fluid shifts  DM2, last A1c 6.3% 09/13/20  Paroxysmal afib  Extubated but BIPAP dependent, BP swings not helping breathing status likely some pulmonary edema component.  - Start nicardipine gtt, if HR becomes issue use dilt gtt instead - Clonidine patch, wean precedex if able - Switch eliquis to lovenox since not able to take PO - Continue diuretics, steroids, abx - Needs better anxiety control as OP as this does not help air trapping issues - Would be good candidate for macrolide suppression therapy and daliresp on discharge given his recurrent COPD admissions  - I am hesitant to work toward a NIV for him until he can show good CPAP compliance   High risk for reintubation, keep in ICU   Best practice (evaluated daily)  Diet: NPO Pain/Anxiety/Delirium protocol (if indicated): precedex, prn ativan VAP protocol (if indicated): Ordered DVT prophylaxis: lovenox GI prophylaxis: Famotidine Glucose control: SSI Mobility: BR Disposition: ICU pending respiratory stability  Goals of Care:  Last date of multidisciplinary goals of care discussion: 1/14 Family and staff present:  RN, patient, sister Summary of discussion: understand advanced COPD, continue aggressive care Follow up goals of care discussion due: 1/23 Code Status: Full code   Patient critically ill due to acute hypoxemic and acute on chronic hypercarbic respiratory  failure Interventions to address this today BIPAP titration, tx of CAP/volume overload/AECOPD Risk of deterioration without these interventions is high  I personally spent 43 minutes providing critical care not including any separately billable procedures  Myrla Halsted MD [facility] 10/06/2020 10:41 AM Personal pager: #[phone] If unanswered, please page CCM On-call: #[phone]

## 2020-10-07 DIAGNOSIS — I469 Cardiac arrest, cause unspecified: Secondary | ICD-10-CM | POA: Diagnosis not present

## 2020-10-07 LAB — CBC
HCT: 31.9 % — ABNORMAL LOW (ref 39.0–52.0)
Hemoglobin: 9.9 g/dL — ABNORMAL LOW (ref 13.0–17.0)
MCH: 28.4 pg (ref 26.0–34.0)
MCHC: 31 g/dL (ref 30.0–36.0)
MCV: 91.7 fL (ref 80.0–100.0)
Platelets: 196 10*3/uL (ref 150–400)
RBC: 3.48 MIL/uL — ABNORMAL LOW (ref 4.22–5.81)
RDW: 14 % (ref 11.5–15.5)
WBC: 12 10*3/uL — ABNORMAL HIGH (ref 4.0–10.5)
nRBC: 0 % (ref 0.0–0.2)

## 2020-10-07 LAB — BASIC METABOLIC PANEL
Anion gap: 13 (ref 5–15)
BUN: 23 mg/dL — ABNORMAL HIGH (ref 6–20)
CO2: 29 mmol/L (ref 22–32)
Calcium: 8.9 mg/dL (ref 8.9–10.3)
Chloride: 97 mmol/L — ABNORMAL LOW (ref 98–111)
Creatinine, Ser: 1.2 mg/dL (ref 0.61–1.24)
GFR, Estimated: >60 mL/min (ref >60)
Glucose, Bld: 166 mg/dL — ABNORMAL HIGH (ref 70–99)
Potassium: 3.9 mmol/L (ref 3.5–5.1)
Sodium: 139 mmol/L (ref 135–145)

## 2020-10-07 LAB — MAGNESIUM: Magnesium: 2.4 mg/dL (ref 1.7–2.4)

## 2020-10-07 LAB — GLUCOSE, CAPILLARY
Glucose-Capillary: 149 mg/dL — ABNORMAL HIGH (ref 70–99)
Glucose-Capillary: 205 mg/dL — ABNORMAL HIGH (ref 70–99)

## 2020-10-07 LAB — CULTURE, RESPIRATORY W GRAM STAIN

## 2020-10-07 MED ORDER — LORAZEPAM 2 MG/ML IJ SOLN
INTRAMUSCULAR | Status: AC
  Administered 2020-10-07: 0.5 mg via INTRAVENOUS
  Filled 2020-10-07: qty 1

## 2020-10-07 MED ORDER — POTASSIUM CHLORIDE CRYS ER 20 MEQ PO TBCR
40.0000 meq | EXTENDED_RELEASE_TABLET | Freq: Two times a day (BID) | ORAL | Status: AC
  Administered 2020-10-07 (×2): 40 meq via ORAL
  Filled 2020-10-07 (×2): qty 2

## 2020-10-07 MED ORDER — LORAZEPAM 2 MG/ML IJ SOLN
0.5000 mg | INTRAMUSCULAR | Status: AC | PRN
Start: 2020-10-07 — End: 2020-10-08
  Administered 2020-10-08: 0.5 mg via INTRAVENOUS
  Filled 2020-10-07: qty 1

## 2020-10-07 MED ORDER — FUROSEMIDE 10 MG/ML IJ SOLN
40.0000 mg | Freq: Four times a day (QID) | INTRAMUSCULAR | Status: AC
  Administered 2020-10-07 (×2): 40 mg via INTRAVENOUS
  Filled 2020-10-07 (×2): qty 4

## 2020-10-07 MED ORDER — SODIUM CHLORIDE 0.9 % IV SOLN
2.0000 g | Freq: Three times a day (TID) | INTRAVENOUS | Status: DC
  Administered 2020-10-07 – 2020-10-08 (×4): 2 g via INTRAVENOUS
  Filled 2020-10-07 (×4): qty 2

## 2020-10-07 NOTE — Progress Notes (Addendum)
eLink Physician-Brief Progress Note Patient Name: Kenneth [name] DOB: May 12, 1961 MRN: [id number]   Date of Service  10/07/2020  HPI/Events of Note  Prec up to 2 with pt coming OOB, very confused, will not keep Bipap on, received ativan on 1/15 and did very well.   Pt did mentioned ETOH with the bedside RN and on 1/15 Dr. Katrinka Blazing reports ETOH  Camera: Talking over his BiPAP, not drinking much last year. following commands. Restless. HR 82.sats ok.on precedex gt 2.  s/p PEA arrest/COPD P afib.     eICU Interventions  qtc 492 from 12 th. Ativan 0.5 mg q30 mins x 2 prn for now Asp precautions.prn abg.  zyprexa trial if not better, low dose.       Intervention Category Major Interventions: Delirium, psychosis, severe agitation - evaluation and management Intermediate Interventions: Other:  [name] 10/07/2020, 11:05 PM

## 2020-10-07 NOTE — Progress Notes (Signed)
NAME:  Kenneth [name], MRN:  [id number], DOB:  March 02, 1961, LOS: 3 ADMISSION DATE:  10/03/2020, CONSULTATION DATE: 10/03/2020 REFERRING MD:  Sabino Donovan, MD, CHIEF COMPLAINT: Status post PEA cardiac arrest  Brief History:  60 year old male presented status post PEA cardiac arrest while he was at fire department.  ROSC was achieved after 5 minutes and received 1 round of epi.  History of Present Illness:  60 year old male with diabetes, hypertension, obesity and obstructive sleep apnea who was brought into the emergency department after he collapsed near fire department and was found to be in PEA arrest, ROSC was achieved after 5 minutes, he received 1 round of epinephrine. As per patient's wife he was in his usual state of health about few days ago when he started complaining of chest congestion and flulike symptoms, this evening he went to the fire department after he started with shortness of breath and chest pain and collapsed in front of it.  Past Medical History:  COPD CKD stage IIIa Obstructive sleep apnea Diabetes type 2 Hyperlipidemia Hypertension GERD Paroxysmal A. fib on apixaban  Pulmonary function testing May 02, 2020 showed FEV1 at 25%, ratio 42, FVC 48%, no significant bronchodilator response., Severe mid flow obstruction with reversibility, DLCO 50%.  2D echo April 07, 2020 EF 45 to 50%, normal pulmonary artery systolic pressure  Significant Hospital Events:  1/12 admitted   Consults:  PCCM  Procedures:  ETT 1/12 >1/14  Significant Diagnostic Tests:  1/12 CTA PE protocol: No evidence of pulmonary embolus. Reflux of contrast into the IVC and hepatic veins compatible with right heart dysfunction  1/12 CT head: No acute intracranial abnormality.  1/13 TTE >> 1. Left ventricular ejection fraction, by estimation, is 55 to 60%. The left ventricle has normal function. The left ventricle has no regional wall motion abnormalities. There is mild left ventricular  hypertrophy. Left ventricular diastolic parameters  are indeterminate.  2. Right ventricular systolic function is normal. The right ventricular size is normal. Tricuspid regurgitation signal is inadequate for assessing PA pressure.  3. A small pericardial effusion is present.  4. The mitral valve is normal in structure. No evidence of mitral valve regurgitation.  5. The aortic valve was not well visualized. Aortic valve regurgitation is mild. Mild to moderate aortic valve stenosis. Vmax 2.8 m/s, MG , AVA 1.6 cm^2, DI 0.4   Micro Data:  1/12 influenza/COVID PCR negative 1/13 MRSA > neg 1/14 RVP >>neg 1/14 trach asp >>  Antimicrobials:  1/14 azithro >> 1/14 ceftriaxone >>  Interim History / Subjective:  Looks a little better today. Wore bipap most of yesterday. Going to try some liquids PO today.  Objective   Blood pressure 135/74, pulse 61, temperature (!) 95 F (35 C), resp. rate 17, height 6' (1.829 m), weight 91.4 kg, SpO2 99 %.    Vent Mode: BIPAP FiO2 (%):  [40 %-50 %] 40 % Set Rate:  [8 bmp-16 bmp] 8 bmp PEEP:  [8 cmH20] 8 cmH20   Intake/Output Summary (Last 24 hours) at 10/07/2020 0829 Last data filed at 10/07/2020 0738 Gross per 24 hour  Intake 1940.72 ml  Output 3725 ml  Net -1784.28 ml   Filed Weights   10/03/20 2047 10/07/20 0449  Weight: 92.4 kg 91.4 kg    Examination: Constitutional: somnolent man in NAD  Eyes: EOMI, pupils equal Ears, nose, mouth, and throat: MM dry, trachea midline Cardiovascular: RRR, ext warm Respiratory: better air movement today but still tight, less tachypneic Gastrointestinal: soft, hypoactive bS  Skin: No rashes, normal turgor Neurologic: moves all 4 ext to command Psychiatric: RASS 0, more alert today  Labs reviewed  Resolved Hospital Problem list     Assessment & Plan:  OOH PEA arrest likely related to AECOPD, no obvious neurological sequelae on sedation wean  Acute on chronic hypoxemic and hypercapneic  respiratory failure- due to pseudomonas pneumonia of bilateral lower lobes, volume overloaded state of heart; improved AKI on CKD vs. Just worsening CKD due to recurrent hospitalizations, fluid shifts, improved  DM2, last A1c 6.3% 09/13/20  Paroxysmal afib HTNsive urgency- on nicardipine gtt Anxiety/agitation- related to WOB, improved with BIPAP and precedex  - Change abx to cefepime, duration 7 days - IV steroids x 1 more day - Continue nebs as ordered - Continue diuresis as tolerated by renal function - Try to wean from BIPAP during day - Wean precedex as able - Remains high risk for intubation - On DC would like to see him on daliresp +/- suppressive azithromycin  Best practice (evaluated daily)  Diet: trial of clears Pain/Anxiety/Delirium protocol (if indicated): precedex, prn ativan VAP protocol (if indicated): off DVT prophylaxis: lovenox GI prophylaxis: ppi Glucose control: DC Mobility: BR Disposition: ICU pending respiratory stability  Goals of Care:  Last date of multidisciplinary goals of care discussion: 1/14 Family and staff present:  RN, patient, sister Summary of discussion: understand advanced COPD, continue aggressive care Follow up goals of care discussion due: 1/23 Code Status: Full code   Patient critically ill due to acute hypoxemic and acute on chronic hypercarbic respiratory failure Interventions to address this today BIPAP titration, tx of CAP/volume overload/AECOPD Risk of deterioration without these interventions is high  I personally spent 33 minutes providing critical care not including any separately billable procedures  Myrla Halsted MD  Pulmonary Critical Care 10/07/2020 8:29 AM Personal pager: [phone] If unanswered, please page CCM On-call: #[phone]

## 2020-10-07 NOTE — Progress Notes (Signed)
Entered room to patient attempting to get OOB and extremely agitated/confused/attempting to remove equipment. Patient only oriented to self and location. Disoriented to date and situation. IV partially removed and bleeding but able to be redressed and flushed/working appropriately. Patient assisted back to bed and BiPap placed. Attempts to reorient patient unsuccessful. Precedex remains at . [facility] notified and awaiting orders. Safety sitter order placed.

## 2020-10-08 ENCOUNTER — Telehealth: Payer: Medicare Other

## 2020-10-08 DIAGNOSIS — I469 Cardiac arrest, cause unspecified: Secondary | ICD-10-CM | POA: Diagnosis not present

## 2020-10-08 LAB — CBC
HCT: 34.7 % — ABNORMAL LOW (ref 39.0–52.0)
Hemoglobin: 10.1 g/dL — ABNORMAL LOW (ref 13.0–17.0)
MCH: 27.2 pg (ref 26.0–34.0)
MCHC: 29.1 g/dL — ABNORMAL LOW (ref 30.0–36.0)
MCV: 93.5 fL (ref 80.0–100.0)
Platelets: 219 10*3/uL (ref 150–400)
RBC: 3.71 MIL/uL — ABNORMAL LOW (ref 4.22–5.81)
RDW: 14 % (ref 11.5–15.5)
WBC: 13.8 10*3/uL — ABNORMAL HIGH (ref 4.0–10.5)
nRBC: 0 % (ref 0.0–0.2)

## 2020-10-08 LAB — BASIC METABOLIC PANEL
Anion gap: 12 (ref 5–15)
BUN: 25 mg/dL — ABNORMAL HIGH (ref 6–20)
CO2: 29 mmol/L (ref 22–32)
Calcium: 9.2 mg/dL (ref 8.9–10.3)
Chloride: 97 mmol/L — ABNORMAL LOW (ref 98–111)
Creatinine, Ser: 1.28 mg/dL — ABNORMAL HIGH (ref 0.61–1.24)
GFR, Estimated: >60 mL/min (ref >60)
Glucose, Bld: 243 mg/dL — ABNORMAL HIGH (ref 70–99)
Potassium: 4.6 mmol/L (ref 3.5–5.1)
Sodium: 138 mmol/L (ref 135–145)

## 2020-10-08 LAB — GLUCOSE, CAPILLARY
Glucose-Capillary: 305 mg/dL — ABNORMAL HIGH (ref 70–99)
Glucose-Capillary: 242 mg/dL — ABNORMAL HIGH (ref 70–99)

## 2020-10-08 LAB — MAGNESIUM: Magnesium: 2.3 mg/dL (ref 1.7–2.4)

## 2020-10-08 MED ORDER — INSULIN ASPART 100 UNIT/ML ~~LOC~~ SOLN
0.0000 [IU] | Freq: Three times a day (TID) | SUBCUTANEOUS | Status: DC
  Administered 2020-10-09: 3 [IU] via SUBCUTANEOUS
  Administered 2020-10-09 – 2020-10-10 (×2): 2 [IU] via SUBCUTANEOUS
  Administered 2020-10-10 (×2): 3 [IU] via SUBCUTANEOUS
  Administered 2020-10-11: 5 [IU] via SUBCUTANEOUS
  Administered 2020-10-12: 3 [IU] via SUBCUTANEOUS
  Administered 2020-10-12: 8 [IU] via SUBCUTANEOUS
  Administered 2020-10-12 – 2020-10-13 (×2): 2 [IU] via SUBCUTANEOUS
  Administered 2020-10-13: 3 [IU] via SUBCUTANEOUS

## 2020-10-08 MED ORDER — LORAZEPAM 2 MG/ML IJ SOLN
2.0000 mg | Freq: Once | INTRAMUSCULAR | Status: AC
  Administered 2020-10-08: 2 mg via INTRAVENOUS

## 2020-10-08 MED ORDER — METHYLPREDNISOLONE SODIUM SUCC 40 MG IJ SOLR
40.0000 mg | INTRAMUSCULAR | Status: DC
  Administered 2020-10-09 – 2020-10-11 (×3): 40 mg via INTRAVENOUS
  Filled 2020-10-08 (×3): qty 1

## 2020-10-08 MED ORDER — LORAZEPAM 2 MG/ML IJ SOLN
INTRAMUSCULAR | Status: AC
  Filled 2020-10-08: qty 1

## 2020-10-08 MED ORDER — INSULIN ASPART 100 UNIT/ML ~~LOC~~ SOLN
0.0000 [IU] | Freq: Every day | SUBCUTANEOUS | Status: DC
  Administered 2020-10-08: 2 [IU] via SUBCUTANEOUS
  Administered 2020-10-12: 3 [IU] via SUBCUTANEOUS

## 2020-10-08 MED ORDER — HALOPERIDOL LACTATE 5 MG/ML IJ SOLN
2.0000 mg | Freq: Four times a day (QID) | INTRAMUSCULAR | Status: DC | PRN
  Filled 2020-10-08: qty 1

## 2020-10-08 MED ORDER — SODIUM CHLORIDE 0.9 % IV SOLN
2.0000 g | Freq: Three times a day (TID) | INTRAVENOUS | Status: DC
  Administered 2020-10-08 – 2020-10-13 (×15): 2 g via INTRAVENOUS
  Filled 2020-10-08 (×20): qty 2

## 2020-10-08 MED ORDER — FUROSEMIDE 10 MG/ML IJ SOLN
40.0000 mg | Freq: Four times a day (QID) | INTRAMUSCULAR | Status: AC
  Administered 2020-10-08 (×2): 40 mg via INTRAVENOUS
  Filled 2020-10-08 (×2): qty 4

## 2020-10-08 MED ORDER — HALOPERIDOL LACTATE 5 MG/ML IJ SOLN: 1.0000 mg | INTRAMUSCULAR | Status: DC | PRN

## 2020-10-08 MED ORDER — HALOPERIDOL LACTATE 5 MG/ML IJ SOLN
1.0000 mg | Freq: Once | INTRAMUSCULAR | Status: AC | PRN
  Administered 2020-10-08: 1 mg via INTRAVENOUS
  Filled 2020-10-08: qty 1

## 2020-10-08 NOTE — Progress Notes (Signed)
NAME:  Kenneth [name], MRN:  [id number], DOB:  1961/07/24, LOS: 4 ADMISSION DATE:  10/03/2020, CONSULTATION DATE: 10/03/2020 REFERRING MD:  Sabino Donovan, MD, CHIEF COMPLAINT: Status post PEA cardiac arrest  Brief History:  60 year old male presented status post PEA cardiac arrest while he was at fire department.  ROSC was achieved after 5 minutes and received 1 round of epi.  History of Present Illness:  60 year old male with diabetes, hypertension, obesity and obstructive sleep apnea who was brought into the emergency department after he collapsed near fire department and was found to be in PEA arrest, ROSC was achieved after 5 minutes, he received 1 round of epinephrine. As per patient's wife he was in his usual state of health about few days ago when he started complaining of chest congestion and flulike symptoms, this evening he went to the fire department after he started with shortness of breath and chest pain and collapsed in front of it.  Past Medical History:  COPD CKD stage IIIa Obstructive sleep apnea Diabetes type 2 Hyperlipidemia Hypertension GERD Paroxysmal A. fib on apixaban  Pulmonary function testing May 02, 2020 showed FEV1 at 25%, ratio 42, FVC 48%, no significant bronchodilator response., Severe mid flow obstruction with reversibility, DLCO 50%.  2D echo April 07, 2020 EF 45 to 50%, normal pulmonary artery systolic pressure  Significant Hospital Events:  1/12 admitted   Consults:  PCCM  Procedures:  ETT 1/12 >1/14  Significant Diagnostic Tests:  1/12 CTA PE protocol: No evidence of pulmonary embolus. Reflux of contrast into the IVC and hepatic veins compatible with right heart dysfunction  1/12 CT head: No acute intracranial abnormality.  1/13 TTE >> 1. Left ventricular ejection fraction, by estimation, is 55 to 60%. The left ventricle has normal function. The left ventricle has no regional wall motion abnormalities. There is mild left ventricular  hypertrophy. Left ventricular diastolic parameters  are indeterminate.  2. Right ventricular systolic function is normal. The right ventricular size is normal. Tricuspid regurgitation signal is inadequate for assessing PA pressure.  3. A small pericardial effusion is present.  4. The mitral valve is normal in structure. No evidence of mitral valve regurgitation.  5. The aortic valve was not well visualized. Aortic valve regurgitation is mild. Mild to moderate aortic valve stenosis. Vmax 2.8 m/s, MG , AVA 1.6 cm^2, DI 0.4   Micro Data:  1/12 influenza/COVID PCR negative 1/13 MRSA > neg 1/14 RVP >>neg 1/14 trach asp >>  Antimicrobials:  1/14 azithro >> 1/14 ceftriaxone >>  Interim History / Subjective:  Unfortunately sundowned overnight.  Received ativan.  Confused this AM.  Objective   Blood pressure (!) 162/68, pulse 79, temperature 99 F (37.2 C), temperature source Axillary, resp. rate 16, height 6' (1.829 m), weight 91.4 kg, SpO2 99 %.    Vent Mode: BIPAP FiO2 (%):  [30 %-40 %] 30 % Set Rate:  [8 bmp] 8 bmp PEEP:  [8 cmH20] 8 cmH20   Intake/Output Summary (Last 24 hours) at 10/08/2020 0865 Last data filed at 10/08/2020 0900 Gross per 24 hour  Intake 1656.71 ml  Output 2725 ml  Net -1068.29 ml   Filed Weights   10/03/20 2047 10/07/20 0449  Weight: 92.4 kg 91.4 kg    Examination: Constitutional: confused man on BIPAP  Eyes: EOMI, pupils equal Ears, nose, mouth, and throat: BIPAP in place with good seal, trachea midline Cardiovascular: RRR, ext warm Respiratory: Less wheezing, mildly tachypneic Gastrointestinal: Soft, hypoactive BS Skin: No rashes, normal turgor Neurologic:  moves all 4 ext, not following command consistently Psychiatric: RASS +1  Labs reviewed  Resolved Hospital Problem list     Assessment & Plan:  OOH PEA arrest likely related to AECOPD, no obvious neurological sequelae on sedation wean  Acute on chronic hypoxemic and hypercapneic  respiratory failure- due to pseudomonas pneumonia of bilateral lower lobes, volume overloaded state of heart; improved AKI on CKD vs. Just worsening CKD due to recurrent hospitalizations, fluid shifts, improved  DM2, last A1c 6.3% 09/13/20  Paroxysmal afib- on eliquis PTA HTNsive urgency- improved w/ current Rx Metabolic encephalopathy- c/w ICU delirium, new overnight 1/16-1/17, on precedex, clonidine patch; some etoh use but does not seem c/w alcohol dependence  - Ceftazidine duration 7 days  (switched from cefepime due to neurotoxicity) - IV steroids x 5 more days, (switch to daily to reduce delirium) - Continue nebs as ordered - Continue diuresis as tolerated by renal function - Try to wean from BIPAP during day - Wean precedex as able, trial of haldol PRN - Remains high risk for intubation unfortunately - On DC would like to see him on daliresp +/- suppressive azithromycin  Best practice (evaluated daily)  Diet: clear if tolerates Pain/Anxiety/Delirium protocol (if indicated): precedex, prn ativan VAP protocol (if indicated): off DVT prophylaxis: lovenox treatment dose GI prophylaxis: ppi Glucose control: DC Mobility: BR Disposition: ICU pending respiratory stability  Goals of Care:  Last date of multidisciplinary goals of care discussion: 1/14 Family and staff present:  RN, patient, sister Summary of discussion: understand advanced COPD, continue aggressive care Follow up goals of care discussion due: 1/23 Code Status: Full code   Patient critically ill due to acute hypoxemic and acute on chronic hypercarbic respiratory failure, acute metabolic encephalopathy Interventions to address this today BIPAP titration, tx of CAP/volume overload/AECOPD, redirection, precedex titration Risk of deterioration without these interventions is high  I personally spent 35 minutes providing critical care not including any separately billable procedures  Myrla Halsted MD [place] [facility]  Critical Care 10/08/2020 9:28 AM Personal pager: [phone] If unanswered, please page CCM On-call: [phone]

## 2020-10-08 NOTE — Progress Notes (Deleted)
Cardiology Office Note   Date:  10/08/2020   ID:  Kenneth [name], DOB 1961/06/01, MRN [id number]  PCP:  Mliss Sax, MD  Cardiologist:  Dr. Cristal Deer  No chief complaint on file.    History of Present Illness: Kenneth [name] is a 60 y.o. male who presents for ***    Past Medical History:  Diagnosis Date  . Anxiety   . Arthritis   . Atrial fibrillation (HCC)   . CHF (congestive heart failure) (HCC)   . COPD (chronic obstructive pulmonary disease) (HCC)    emphysema  . Depression   . Diabetes mellitus without complication (HCC)   . Heart murmur   . Hyperlipidemia   . Hypertension   . Sleep apnea with use of continuous positive airway pressure (CPAP)     Past Surgical History:  Procedure Laterality Date  . NO PAST SURGERIES       No current facility-administered medications for this visit.   No current outpatient medications on file.   Facility-Administered Medications Ordered in Other Visits  Medication Dose Route Frequency Provider Last Rate Last Admin  . 0.9 %  sodium chloride infusion   Intravenous PRN Cheri Fowler, MD 10 mL/hr at 10/08/20 1000 Infusion Verify at 10/08/20 1000  . 0.9 %  sodium chloride infusion  250 mL Intravenous Continuous Mosetta Anis, RPH      . albuterol (PROVENTIL) (2.5 MG/3ML) 0.083% nebulizer solution 2.5 mg  2.5 mg Nebulization Q2H PRN Selmer Dominion B, NP   2.5 mg at 10/07/20 2242  . arformoterol (BROVANA) nebulizer solution 15 mcg  15 mcg Nebulization BID Selmer Dominion B, NP   15 mcg at 10/08/20 0746  . budesonide (PULMICORT) nebulizer solution 0.5 mg  0.5 mg Nebulization BID Selmer Dominion B, NP   0.5 mg at 10/08/20 0821  . cefTAZidime (FORTAZ) 2 g in sodium chloride 0.9 % 100 mL IVPB  2 g Intravenous Q8H Lorin Glass, MD      . chlorhexidine (PERIDEX) 0.12 % solution 15 mL  15 mL Mouth Rinse BID Lorin Glass, MD   15 mL at 10/07/20 2100  . Chlorhexidine Gluconate Cloth 2 % PADS 6 each  6 each Topical Daily Cheri Fowler, MD   6 each at 10/07/20 0924  . cloNIDine (CATAPRES - Dosed in mg/24 hr) patch 0.2 mg  0.2 mg Transdermal Weekly Lorin Glass, MD   0.2 mg at 10/06/20 1207  . dexmedetomidine (PRECEDEX) 400 MCG/100ML (4 mcg/mL) infusion  0.4-2 mcg/kg/hr Intravenous Titrated Selmer Dominion B, NP 39.3 mL/hr at 10/08/20 1000 1.7 mcg/kg/hr at 10/08/20 1000  . docusate (COLACE) 50 MG/5ML liquid 100 mg  100 mg Per Tube BID PRN Phillips Hay, RPH      . enoxaparin (LOVENOX) injection 90 mg  90 mg Subcutaneous Q12H Lorin Glass, MD   90 mg at 10/08/20 1042  . furosemide (LASIX) injection 40 mg  40 mg Intravenous Q6H Lorin Glass, MD   40 mg at 10/08/20 1005  . gabapentin (NEURONTIN) 250 MG/5ML solution 300 mg  300 mg Oral BID Mosetta Anis, RPH   300 mg at 10/07/20 2100  . haloperidol lactate (HALDOL) injection 2 mg  2 mg Intravenous Q6H PRN Lorin Glass, MD      . hydrALAZINE (APRESOLINE) injection 10-20 mg  10-20 mg Intravenous Q4H PRN Migdalia Dk, MD   20 mg at 10/08/20 1005  . ipratropium-albuterol (DUONEB) 0.5-2.5 (3) MG/3ML nebulizer solution 3 mL  3  mL Nebulization Q4H Selmer Dominion B, NP   3 mL at 10/08/20 0744  . labetalol (NORMODYNE) injection 10-20 mg  10-20 mg Intravenous Q2H PRN Migdalia Dk, MD   20 mg at 10/06/20 0530  . MEDLINE mouth rinse  15 mL Mouth Rinse q12n4p Lorin Glass, MD   15 mL at 10/07/20 1559  . [START ON 10/09/2020] methylPREDNISolone sodium succinate (SOLU-MEDROL) 40 mg/mL injection 40 mg  40 mg Intravenous Q24H Lorin Glass, MD      . pantoprazole (PROTONIX) injection 40 mg  40 mg Intravenous Q24H Mosetta Anis, RPH   40 mg at 10/08/20 1041  . polyethylene glycol (MIRALAX / GLYCOLAX) packet 17 g  17 g Per Tube Daily PRN Phillips Hay, RPH        Allergies:   Lisinopril, Other, and Tomato    Social History:  The patient  reports that he quit smoking about 17 months ago. His smoking use included cigarettes. He has a 60.00 pack-year  smoking history. He has never used smokeless tobacco. He reports current alcohol use. He reports current drug use. Drug: Marijuana.   Family History:  The patient's family history includes Coronary artery disease in his brother; Diabetes in his father, mother, and sister; Heart disease in his father, mother, and sister; Kidney disease in his sister; Liver disease in his maternal uncle.    ROS: All other systems are reviewed and negative. Unless otherwise mentioned in H&P    PHYSICAL EXAM: VS:  There were no vitals taken for this visit. , BMI There is no height or weight on file to calculate BMI. GEN: Well nourished, well developed, in no acute distress HEENT: normal Neck: no JVD, carotid bruits, or masses Cardiac: ***RRR; no murmurs, rubs, or gallops,no edema  Respiratory:  Clear to auscultation bilaterally, normal work of breathing GI: soft, nontender, nondistended, + BS MS: no deformity or atrophy Skin: warm and dry, no rash Neuro:  Strength and sensation are intact Psych: euthymic mood, full affect   EKG:  EKG {ACTION; IS/IS XNA:[id number]} ordered today. The ekg ordered today demonstrates ***   Recent Labs: 10/03/2020: B Natriuretic Peptide 960.1 10/05/2020: ALT 34 10/08/2020: BUN 25; Creatinine, Ser 1.28; Hemoglobin 10.1; Magnesium 2.3; Platelets 219; Potassium 4.6; Sodium 138    Lipid Panel    Component Value Date/Time   CHOL 226 (H) 07/06/2018 1355   TRIG 275 (H) 10/05/2020 0126   HDL 68.40 07/06/2018 1355   CHOLHDL 3 07/06/2018 1355   VLDL 38.0 07/06/2018 1355   LDLCALC 119 (H) 07/06/2018 1355      Wt Readings from Last 3 Encounters:  10/07/20 201 lb 8 oz (91.4 kg)  09/25/20 203 lb 9.6 oz (92.4 kg)  09/23/20 201 lb 11.5 oz (91.5 kg)      Other studies Reviewed: Additional studies/ records that were reviewed today include: ***. Review of the above records demonstrates: ***   ASSESSMENT AND PLAN:  1.  ***   Current medicines are reviewed at length with  the patient today.  I have spent *** dedicated to the care of this patient on the date of this encounter to include pre-visit review of records, assessment, management and diagnostic testing,with shared decision making.  Labs/ tests ordered today include: *** Bettey Mare. Liborio Nixon, ANP, [facility]   10/08/2020 10:58 AM    [facility] [address] Office [phone] Fax [phone]  Notice: This dictation was prepared with Dragon dictation along with smaller  Company secretary. Any transcriptional errors that result from this process are unintentional and may not be corrected upon review.

## 2020-10-08 NOTE — Plan of Care (Signed)
  Problem: Clinical Measurements: Goal: Ability to maintain clinical measurements within normal limits will improve Outcome: Progressing Goal: Diagnostic test results will improve Outcome: Progressing Goal: Respiratory complications will improve Outcome: Progressing Goal: Cardiovascular complication will be avoided Outcome: Progressing   Problem: Activity: Goal: Risk for activity intolerance will decrease Outcome: Progressing   Problem: Elimination: Goal: Will not experience complications related to urinary retention Outcome: Progressing   Problem: Skin Integrity: Goal: Risk for impaired skin integrity will decrease Outcome: Progressing   Problem: Activity: Goal: Ability to tolerate increased activity will improve Outcome: Progressing   Problem: Respiratory: Goal: Ability to maintain a clear airway and adequate ventilation will improve Outcome: Progressing   Problem: Education: Goal: Knowledge of General Education information will improve Description: Including pain rating scale, medication(s)/side effects and non-pharmacologic comfort measures Outcome: Not Progressing   Problem: Health Behavior/Discharge Planning: Goal: Ability to manage health-related needs will improve Outcome: Not Progressing   Problem: Clinical Measurements: Goal: Will remain free from infection Outcome: Not Progressing   Problem: Nutrition: Goal: Adequate nutrition will be maintained Outcome: Not Progressing   Problem: Elimination: Goal: Will not experience complications related to bowel motility Outcome: Not Progressing   Problem: Pain Managment: Goal: General experience of comfort will improve Outcome: Not Progressing   Problem: Safety: Goal: Ability to remain free from injury will improve Outcome: Not Progressing   Problem: Role Relationship: Goal: Method of communication will improve Outcome: Not Progressing

## 2020-10-08 NOTE — Progress Notes (Signed)
eLink Physician-Brief Progress Note Patient Name: Kenneth [name] DOB: 08-05-1961 MRN: [id number]   Date of Service  10/08/2020  HPI/Events of Note  CBG 305 Tolerating PO intake  eICU Interventions  Moderate SSI ordered     Intervention Category Intermediate Interventions: Hyperglycemia - evaluation and treatment  Rosalie Gums [name] 10/08/2020, 8:26 PM

## 2020-10-08 NOTE — Progress Notes (Signed)
eLink Physician-Brief Progress Note Patient Name: Kenneth [name] DOB: 08-30-1961 MRN: [id number]   Date of Service  10/08/2020  HPI/Events of Note  Ativan prn worked  Briefly.   eICU Interventions  Haldol 1 mg IV  and ativan 2mg  IV. Once. Keep BiPAP on.      Intervention Category Intermediate Interventions: Other:  10/08/2020, 1:00 AM

## 2020-10-08 NOTE — Progress Notes (Signed)
Patient taken off of bipap and placed on 5L nasal cannula.  Currently tolerating well.  Will continue to monitor.

## 2020-10-09 ENCOUNTER — Inpatient Hospital Stay (HOSPITAL_COMMUNITY): Payer: Medicare Other

## 2020-10-09 DIAGNOSIS — I469 Cardiac arrest, cause unspecified: Secondary | ICD-10-CM | POA: Diagnosis not present

## 2020-10-09 LAB — MAGNESIUM: Magnesium: 2.9 mg/dL — ABNORMAL HIGH (ref 1.7–2.4)

## 2020-10-09 LAB — CBC
HCT: 37 % — ABNORMAL LOW (ref 39.0–52.0)
Hemoglobin: 10.9 g/dL — ABNORMAL LOW (ref 13.0–17.0)
MCH: 27.5 pg (ref 26.0–34.0)
MCHC: 29.5 g/dL — ABNORMAL LOW (ref 30.0–36.0)
MCV: 93.2 fL (ref 80.0–100.0)
Platelets: 223 10*3/uL (ref 150–400)
RBC: 3.97 MIL/uL — ABNORMAL LOW (ref 4.22–5.81)
RDW: 14.3 % (ref 11.5–15.5)
WBC: 13.1 10*3/uL — ABNORMAL HIGH (ref 4.0–10.5)
nRBC: 0 % (ref 0.0–0.2)

## 2020-10-09 LAB — BASIC METABOLIC PANEL
Anion gap: 11 (ref 5–15)
BUN: 42 mg/dL — ABNORMAL HIGH (ref 6–20)
CO2: 29 mmol/L (ref 22–32)
Calcium: 9.5 mg/dL (ref 8.9–10.3)
Chloride: 100 mmol/L (ref 98–111)
Creatinine, Ser: 1.72 mg/dL — ABNORMAL HIGH (ref 0.61–1.24)
GFR, Estimated: 45 mL/min — ABNORMAL LOW (ref >60)
Glucose, Bld: 130 mg/dL — ABNORMAL HIGH (ref 70–99)
Potassium: 4.3 mmol/L (ref 3.5–5.1)
Sodium: 140 mmol/L (ref 135–145)

## 2020-10-09 LAB — GLUCOSE, CAPILLARY
Glucose-Capillary: 181 mg/dL — ABNORMAL HIGH (ref 70–99)
Glucose-Capillary: 142 mg/dL — ABNORMAL HIGH (ref 70–99)
Glucose-Capillary: 78 mg/dL (ref 70–99)
Glucose-Capillary: 150 mg/dL — ABNORMAL HIGH (ref 70–99)

## 2020-10-09 MED ORDER — IPRATROPIUM-ALBUTEROL 0.5-2.5 (3) MG/3ML IN SOLN
3.0000 mL | Freq: Four times a day (QID) | RESPIRATORY_TRACT | Status: DC
  Administered 2020-10-09 (×2): 3 mL via RESPIRATORY_TRACT
  Filled 2020-10-09 (×2): qty 3

## 2020-10-09 MED ORDER — METOPROLOL TARTRATE 5 MG/5ML IV SOLN
2.5000 mg | INTRAVENOUS | Status: DC | PRN
Start: 2020-10-09 — End: 2020-10-12

## 2020-10-09 MED ORDER — DOCUSATE SODIUM 100 MG PO CAPS: 100.0000 mg | ORAL_CAPSULE | Freq: Two times a day (BID) | ORAL | Status: DC | PRN

## 2020-10-09 MED ORDER — LOPERAMIDE HCL 2 MG PO CAPS
2.0000 mg | ORAL_CAPSULE | ORAL | Status: DC | PRN
  Administered 2020-10-09: 2 mg via ORAL
  Filled 2020-10-09: qty 1

## 2020-10-09 MED ORDER — POLYETHYLENE GLYCOL 3350 17 G PO PACK: 17.0000 g | PACK | Freq: Every day | ORAL | Status: DC | PRN

## 2020-10-09 MED ORDER — OXYCODONE HCL 5 MG PO TABS
5.0000 mg | ORAL_TABLET | Freq: Four times a day (QID) | ORAL | Status: DC | PRN
  Administered 2020-10-09 – 2020-10-10 (×3): 5 mg via ORAL
  Filled 2020-10-09 (×3): qty 1

## 2020-10-09 MED ORDER — CARVEDILOL 12.5 MG PO TABS
12.5000 mg | ORAL_TABLET | Freq: Two times a day (BID) | ORAL | Status: DC
  Administered 2020-10-09 – 2020-10-12 (×7): 12.5 mg via ORAL
  Filled 2020-10-09 (×7): qty 1

## 2020-10-09 MED ORDER — IPRATROPIUM-ALBUTEROL 0.5-2.5 (3) MG/3ML IN SOLN
3.0000 mL | Freq: Two times a day (BID) | RESPIRATORY_TRACT | Status: DC
  Administered 2020-10-09 – 2020-10-10 (×2): 3 mL via RESPIRATORY_TRACT
  Filled 2020-10-09 (×2): qty 3

## 2020-10-09 NOTE — Progress Notes (Signed)
Patient resting comfortably on 4L [place]. No respiratory distress noted. BIPAP not indicated at this time. BIPAP is at bedside on standby if needed. RT will continue to monitor as needed.

## 2020-10-09 NOTE — Progress Notes (Signed)
Patient taken off of bipap and placed on 4L nasal cannula.  Currently tolerating well.  Will continue to monitor.  

## 2020-10-09 NOTE — Consult Note (Signed)
   [facility] Inpatient Consult   10/09/2020  Kenneth [name] [birth year] [id number]   [facility] [[facility]  Accountable Care Organization [ACO] Patient: [name]  Acknowledgment of admission: Patient is currently active with [facility] [[facility] Care Management for chronic disease management services.      Plan:  [facility] Care Management following following for progress and disposition needs. Of note, [facility] Care Management services does not replace or interfere with any services that are needed or arranged by inpatient [facility] care management team.  For additional questions or referrals please contact:  [name], RN BSN CCM [facility]  [phone] business mobile phone Toll free office [phone]  Fax number: [phone] [email or web address]

## 2020-10-09 NOTE — Progress Notes (Signed)
NAME:  Kenneth [name], MRN:  [id number], DOB:  [birth year], LOS: 5 ADMISSION DATE:  10/03/2020, CONSULTATION DATE: 10/03/2020 REFERRING MD:  Sabino Donovan, MD, CHIEF COMPLAINT: Status post PEA cardiac arrest  Brief History:  60 year old male presented status post PEA cardiac arrest while he was at fire department.  ROSC was achieved after 5 minutes and received 1 round of epi.  History of Present Illness:  59 year old male with diabetes, hypertension, obesity and obstructive sleep apnea who was brought into the emergency department after he collapsed near fire department and was found to be in PEA arrest, ROSC was achieved after 5 minutes, he received 1 round of epinephrine. As per patient's wife he was in his usual state of health about few days ago when he started complaining of chest congestion and flulike symptoms, this evening he went to the fire department after he started with shortness of breath and chest pain and collapsed in front of it.  Past Medical History:  COPD CKD stage IIIa Obstructive sleep apnea Diabetes type 2 Hyperlipidemia Hypertension GERD Paroxysmal A. fib on apixaban  Pulmonary function testing May 02, 2020 showed FEV1 at 25%, ratio 42, FVC 48%, no significant bronchodilator response., Severe mid flow obstruction with reversibility, DLCO 50%.  2D echo April 07, 2020 EF 45 to 50%, normal pulmonary artery systolic pressure  Significant Hospital Events:  1/12 admitted   Consults:  PCCM  Procedures:  ETT 1/12 >1/14  Significant Diagnostic Tests:  1/12 CTA PE protocol: No evidence of pulmonary embolus. Reflux of contrast into the IVC and hepatic veins compatible with right heart dysfunction  1/12 CT head: No acute intracranial abnormality.  1/13 TTE >> 1. Left ventricular ejection fraction, by estimation, is 55 to 60%. The left ventricle has normal function. The left ventricle has no regional wall motion abnormalities. There is mild left ventricular  hypertrophy. Left ventricular diastolic parameters  are indeterminate.  2. Right ventricular systolic function is normal. The right ventricular size is normal. Tricuspid regurgitation signal is inadequate for assessing PA pressure.  3. A small pericardial effusion is present.  4. The mitral valve is normal in structure. No evidence of mitral valve regurgitation.  5. The aortic valve was not well visualized. Aortic valve regurgitation is mild. Mild to moderate aortic valve stenosis. Vmax 2.8 m/s, MG , AVA 1.6 cm^2, DI 0.4   Micro Data:  1/12 influenza/COVID PCR negative 1/13 MRSA > neg 1/14 RVP >>neg 1/14 trach asp >>  Antimicrobials:  1/14 azithro >> 1/14 ceftriaxone >>  Interim History / Subjective:  Pt with conversion to atrial fib with RVR  over night 1/18 ( Rates from 117-130) Hx. If PAF on apixaban , carvedilol   Objective   Blood pressure (!) 149/76, pulse 92, temperature 99.4 F (37.4 C), temperature source Oral, resp. rate 12, height 6' (1.829 m), weight 91.4 kg, SpO2 100 %.    Vent Mode: BIPAP FiO2 (%):  [30 %] 30 % Set Rate:  [8 bmp] 8 bmp PEEP:  [8 cmH20] 8 cmH20   Intake/Output Summary (Last 24 hours) at 10/09/2020 1032 Last data filed at 10/09/2020 0600 Gross per 24 hour  Intake 1031.51 ml  Output 1725 ml  Net -693.49 ml   Filed Weights   10/03/20 2047 10/07/20 0449  Weight: 92.4 kg 91.4 kg    Examination: Constitutional: awake and alert, on 4 l [place] Eyes: EOMI, pupils equal Ears, nose, mouth, and throat: No LAD, No JVD,  trachea midline, MM Pink and moist Cardiovascular:S1,  S2,  RRR, ext warm Respiratory: Bilateral chest excursion, diminished per bases, L>R, no wheeze or rhonchi noted Gastrointestinal: Soft,ND, NT,  hypoactive BS, Body mass index is 27.33 kg/m. Skin: No rashes,No lesions,  normal turgor Neurologic: moves all 4 ext, follows commands, alert and oriented to self place and time Psychiatric: RASS +1  Labs reviewed Creatinine  jumped overnight 1.72 from 1.28 Mag 2.9 T max 99 with WBC of 13.1  Resolved Hospital Problem list     Assessment & Plan:  OOH PEA arrest likely related to AECOPD, no obvious neurological sequelae on sedation wean  Acute on chronic hypoxemic and hypercapneic respiratory failure- due to pseudomonas pneumonia of bilateral lower lobes, volume overloaded state of heart; improved AKI on CKD vs. Just worsening CKD due to recurrent hospitalizations, fluid shifts, improved  DM2, last A1c 6.3% 09/13/20  Paroxysmal afib- on eliquis PTA HTNsive urgency- improved w/ current Rx Metabolic encephalopathy- c/w ICU delirium, new overnight 1/16-1/17, on precedex, clonidine patch; some etoh use but does not seem c/w alcohol dependence  - Ceftazidine duration 7 days  (switched from cefepime due to neurotoxicity) - IV steroids x 4 more days, (switch to daily to reduce delirium) - Continue nebs as ordered - Continue diuresis as tolerated by renal function - Try to wean from BIPAP during day>> tolerating well - precedex is off, trial of haldol PRN - Remains high risk for intubation  - aggressive pulmonary toilet. - Add IS, OOB to chair - On DC would like to see him on daliresp +/- suppressive azithromycin  Doing well today on 4 L. Consider transfer to progressive bed with mandatory BiPAP at [facility] Aggressive Pulmonary Toilet Net - 1300 cc's  Will hold lasix today as bump in creatinine Reconsider 1/19 for additional dosing  Best practice (evaluated daily)  Diet: clear if tolerates Pain/Anxiety/Delirium protocol (if indicated): precedex, prn ativan VAP protocol (if indicated): off DVT prophylaxis: lovenox treatment dose GI prophylaxis: ppi Glucose control: DC Mobility: BR Disposition: ICU pending respiratory stability  Goals of Care:  Last date of multidisciplinary goals of care discussion: 1/14 Family and staff present:  RN, patient, sister Summary of discussion: understand advanced COPD, continue  aggressive care Follow up goals of care discussion due: 1/23 Code Status: Full code   Patient critically ill due to acute hypoxemic and acute on chronic hypercarbic respiratory failure, acute metabolic encephalopathy Interventions to address this today BIPAP titration, tx of CAP/volume overload/AECOPD, redirection, precedex titration Risk of deterioration without these interventions is high  I personally spent 30 minutes providing critical care not including any separately billable procedures   Bevelyn Ngo, MSN, AGACNP-BC [facility] See Amion for personal pager 10/09/2020 10:32 AM

## 2020-10-09 NOTE — Progress Notes (Signed)
eLink Physician-Brief Progress Note Patient Name: Kenneth [name] DOB: February 21, 1961 MRN: [id number]   Date of Service  10/09/2020  HPI/Events of Note  Notified that patient went into afib 117 to 130. Has history of PAF on apixaban and carvedilol BP 156/100  eICU Interventions  Ordered to resume carvedilol 12.5 BID and lopressor IV prn for HR persistently > 130     Intervention Category Major Interventions: Arrhythmia - evaluation and management;Hypertension - evaluation and management  Darl Pikes 10/09/2020, 5:05 AM

## 2020-10-09 NOTE — Progress Notes (Signed)
Patient removed himself from BIPAP. RT placed patient on 4L [place]. No respiratory distress noted at this time. RN is aware. RT will monitor as needed.

## 2020-10-10 ENCOUNTER — Inpatient Hospital Stay (HOSPITAL_COMMUNITY): Payer: Medicare Other

## 2020-10-10 LAB — BASIC METABOLIC PANEL
Anion gap: 10 (ref 5–15)
BUN: 41 mg/dL — ABNORMAL HIGH (ref 6–20)
CO2: 28 mmol/L (ref 22–32)
Calcium: 9.3 mg/dL (ref 8.9–10.3)
Chloride: 102 mmol/L (ref 98–111)
Creatinine, Ser: 1.6 mg/dL — ABNORMAL HIGH (ref 0.61–1.24)
GFR, Estimated: 49 mL/min — ABNORMAL LOW (ref >60)
Glucose, Bld: 137 mg/dL — ABNORMAL HIGH (ref 70–99)
Potassium: 4.3 mmol/L (ref 3.5–5.1)
Sodium: 140 mmol/L (ref 135–145)

## 2020-10-10 LAB — CBC
HCT: 37.9 % — ABNORMAL LOW (ref 39.0–52.0)
Hemoglobin: 11.1 g/dL — ABNORMAL LOW (ref 13.0–17.0)
MCH: 27.6 pg (ref 26.0–34.0)
MCHC: 29.3 g/dL — ABNORMAL LOW (ref 30.0–36.0)
MCV: 94.3 fL (ref 80.0–100.0)
Platelets: 259 10*3/uL (ref 150–400)
RBC: 4.02 MIL/uL — ABNORMAL LOW (ref 4.22–5.81)
RDW: 14.3 % (ref 11.5–15.5)
WBC: 13.5 10*3/uL — ABNORMAL HIGH (ref 4.0–10.5)
nRBC: 0 % (ref 0.0–0.2)

## 2020-10-10 LAB — MAGNESIUM: Magnesium: 2.5 mg/dL — ABNORMAL HIGH (ref 1.7–2.4)

## 2020-10-10 LAB — GLUCOSE, CAPILLARY
Glucose-Capillary: 140 mg/dL — ABNORMAL HIGH (ref 70–99)
Glucose-Capillary: 162 mg/dL — ABNORMAL HIGH (ref 70–99)
Glucose-Capillary: 173 mg/dL — ABNORMAL HIGH (ref 70–99)
Glucose-Capillary: 96 mg/dL (ref 70–99)

## 2020-10-10 MED ORDER — ROSUVASTATIN CALCIUM 20 MG PO TABS
20.0000 mg | ORAL_TABLET | Freq: Every day | ORAL | Status: DC
  Administered 2020-10-10: 20 mg
  Filled 2020-10-10: qty 1

## 2020-10-10 MED ORDER — FUROSEMIDE 40 MG PO TABS
40.0000 mg | ORAL_TABLET | Freq: Every day | ORAL | Status: DC
  Administered 2020-10-10 – 2020-10-13 (×4): 40 mg via ORAL
  Filled 2020-10-10 (×4): qty 1

## 2020-10-10 MED ORDER — ARIPIPRAZOLE 5 MG PO TABS
5.0000 mg | ORAL_TABLET | Freq: Every day | ORAL | Status: DC
Start: 2020-10-10 — End: 2020-10-13
  Administered 2020-10-10 – 2020-10-13 (×4): 5 mg via ORAL
  Filled 2020-10-10 (×4): qty 1

## 2020-10-10 MED ORDER — APIXABAN 5 MG PO TABS
5.0000 mg | ORAL_TABLET | Freq: Two times a day (BID) | ORAL | Status: DC
  Administered 2020-10-10 – 2020-10-13 (×6): 5 mg via ORAL
  Filled 2020-10-10 (×6): qty 1

## 2020-10-10 MED ORDER — FLUOXETINE HCL 20 MG PO CAPS
40.0000 mg | ORAL_CAPSULE | Freq: Every day | ORAL | Status: DC
  Administered 2020-10-10 – 2020-10-13 (×4): 40 mg via ORAL
  Filled 2020-10-10 (×4): qty 2

## 2020-10-10 MED ORDER — GABAPENTIN 600 MG PO TABS
300.0000 mg | ORAL_TABLET | Freq: Two times a day (BID) | ORAL | Status: DC
  Administered 2020-10-10 – 2020-10-13 (×6): 300 mg via ORAL
  Filled 2020-10-10 (×6): qty 1

## 2020-10-10 MED ORDER — ADULT MULTIVITAMIN W/MINERALS CH
1.0000 | ORAL_TABLET | Freq: Every day | ORAL | Status: DC
  Administered 2020-10-10 – 2020-10-13 (×4): 1 via ORAL
  Filled 2020-10-10 (×4): qty 1

## 2020-10-10 MED ORDER — ARIPIPRAZOLE 5 MG PO TABS
5.0000 mg | ORAL_TABLET | Freq: Every day | ORAL | Status: DC
  Filled 2020-10-10: qty 1

## 2020-10-10 MED ORDER — TRAMADOL HCL 50 MG PO TABS
50.0000 mg | ORAL_TABLET | Freq: Four times a day (QID) | ORAL | Status: DC | PRN
  Administered 2020-10-10 – 2020-10-13 (×5): 50 mg via ORAL
  Filled 2020-10-10 (×5): qty 1

## 2020-10-10 MED ORDER — ISOSORBIDE MONONITRATE ER 30 MG PO TB24
30.0000 mg | ORAL_TABLET | Freq: Every day | ORAL | Status: DC
  Administered 2020-10-10 – 2020-10-13 (×4): 30 mg via ORAL
  Filled 2020-10-10 (×4): qty 1

## 2020-10-10 MED ORDER — PANTOPRAZOLE SODIUM 40 MG PO TBEC
40.0000 mg | DELAYED_RELEASE_TABLET | Freq: Every day | ORAL | Status: DC
  Administered 2020-10-10 – 2020-10-13 (×4): 40 mg via ORAL
  Filled 2020-10-10 (×4): qty 1

## 2020-10-10 MED ORDER — BUSPIRONE HCL 5 MG PO TABS
15.0000 mg | ORAL_TABLET | Freq: Two times a day (BID) | ORAL | Status: DC
Start: 2020-10-10 — End: 2020-10-13
  Administered 2020-10-10 – 2020-10-13 (×6): 15 mg via ORAL
  Filled 2020-10-10 (×6): qty 1

## 2020-10-10 MED ORDER — UMECLIDINIUM BROMIDE 62.5 MCG/INH IN AEPB
1.0000 | INHALATION_SPRAY | Freq: Every day | RESPIRATORY_TRACT | Status: DC
  Administered 2020-10-11 – 2020-10-13 (×3): 1 via RESPIRATORY_TRACT
  Filled 2020-10-10: qty 7

## 2020-10-10 MED ORDER — BUSPIRONE HCL 10 MG PO TABS
15.0000 mg | ORAL_TABLET | Freq: Two times a day (BID) | ORAL | Status: DC
  Administered 2020-10-10: 15 mg
  Filled 2020-10-10: qty 2

## 2020-10-10 MED ORDER — LIDOCAINE 5 % EX PTCH
1.0000 | MEDICATED_PATCH | CUTANEOUS | Status: DC
  Administered 2020-10-10 – 2020-10-12 (×3): 1 via TRANSDERMAL
  Filled 2020-10-10 (×4): qty 1

## 2020-10-10 MED ORDER — FLUTICASONE FUROATE-VILANTEROL 200-25 MCG/INH IN AEPB
1.0000 | INHALATION_SPRAY | Freq: Every day | RESPIRATORY_TRACT | Status: DC
  Administered 2020-10-11 – 2020-10-13 (×3): 1 via RESPIRATORY_TRACT
  Filled 2020-10-10: qty 28

## 2020-10-10 MED ORDER — ROSUVASTATIN CALCIUM 20 MG PO TABS
20.0000 mg | ORAL_TABLET | Freq: Every day | ORAL | Status: DC
  Administered 2020-10-11 – 2020-10-13 (×3): 20 mg via ORAL
  Filled 2020-10-10 (×3): qty 1

## 2020-10-10 MED ORDER — ACETAMINOPHEN 325 MG PO TABS
650.0000 mg | ORAL_TABLET | ORAL | Status: DC | PRN
  Administered 2020-10-11 – 2020-10-13 (×2): 650 mg via ORAL
  Filled 2020-10-10 (×2): qty 2

## 2020-10-10 NOTE — Evaluation (Signed)
Occupational Therapy Evaluation Patient Details Name: Kenneth [name] MRN: [id number] DOB: [birth year] Today's Date: 10/10/2020    History of Present Illness 60 year old male with diabetes, hypertension, obesity and obstructive sleep apnea who was brought into the emergency department after he collapsed near fire department and was found to be in PEA arrest.   Clinical Impression   Patient admitted for the diagnosis above.  Deficits impacting independence are listed below.  PTA he states he was fairly sedentary.  He did use O2 at 4 L [place] at home, and depending on SOB that day, would need Min A for lower body ADL.  Generally he was independent with mobility in the home, ADL and toileting.  His finance completed meals and home management.  Currently OT is required to maximize his ADL and functional mobility for an eventual return home.  See below for current functional status.     Follow Up Recommendations  Home health OT    Equipment Recommendations  None recommended by OT    Recommendations for Other Services       Precautions / Restrictions Precautions Precautions: Fall Restrictions Weight Bearing Restrictions: No      Mobility Bed Mobility Overal bed mobility: Needs Assistance Bed Mobility: Supine to Sit     Supine to sit: Min assist;HOB elevated          Transfers Overall transfer level: Needs assistance Equipment used: Rolling walker (2 wheeled) Transfers: Sit to/from [employer] Sit to Stand: Min assist Stand pivot transfers: Min assist            Balance Overall balance assessment: Needs assistance Sitting-balance support: Bilateral upper extremity supported Sitting balance-Leahy Scale: Fair     Standing balance support: Bilateral upper extremity supported Standing balance-Leahy Scale: Poor Standing balance comment: RW used                           ADL either performed or assessed with clinical judgement   ADL Overall ADL's  : Needs assistance/impaired Eating/Feeding: Set up;Bed level   Grooming: Wash/dry hands;Wash/dry face;Set up;Bed level   Upper Body Bathing: Minimal assistance;Sitting   Lower Body Bathing: Moderate assistance;Sitting/lateral leans   Upper Body Dressing : Minimal assistance;Sitting   Lower Body Dressing: Moderate assistance;Sitting/lateral leans               Functional mobility during ADLs: Minimal assistance;Rolling walker       Vision Baseline Vision/History: Wears glasses Wears Glasses: At all times Patient Visual Report: No change from baseline       Perception     Praxis      Pertinent Vitals/Pain Pain Assessment: Faces Faces Pain Scale: Hurts little more Pain Location: sternal pain due to chest compressions. Pain Descriptors / Indicators: Tender Pain Intervention(s): Monitored during session     Hand Dominance Right   Extremity/Trunk Assessment Upper Extremity Assessment Upper Extremity Assessment: Generalized weakness   Lower Extremity Assessment Lower Extremity Assessment: Defer to PT evaluation   Cervical / Trunk Assessment Cervical / Trunk Assessment: Normal   Communication Communication Communication: No difficulties   Cognition Arousal/Alertness: Awake/alert Behavior During Therapy: WFL for tasks assessed/performed Overall Cognitive Status: Within Functional Limits for tasks assessed                                     General Comments   VSS on 4L    Exercises  Shoulder Instructions      Home Living Family/patient expects to be discharged to:: Private residence Living Arrangements: Spouse/significant other Available Help at Discharge: Family;Available 24 hours/day Type of Home: Apartment Home Access: Level entry     Home Layout: One level     Bathroom Shower/Tub: Chief Strategy Officer: Standard     Home Equipment: Shower seat          Prior Functioning/Environment Level of  Independence: Independent        Comments: patient drives occasionally.        OT Problem List: Decreased strength;Decreased activity tolerance;Impaired balance (sitting and/or standing)      OT Treatment/Interventions: Self-care/ADL training;Therapeutic exercise;Energy conservation;DME and/or AE instruction;Balance training;Therapeutic activities    OT Goals(Current goals can be found in the care plan section) Acute Rehab OT Goals Patient Stated Goal: I want to move a little better OT Goal Formulation: With patient Time For Goal Achievement: 10/24/20 Potential to Achieve Goals: Good ADL Goals Pt Will Perform Grooming: with set-up;standing Pt Will Perform Lower Body Bathing: with supervision;sit to/from stand Pt Will Perform Lower Body Dressing: with supervision;sit to/from stand Pt Will Transfer to Toilet: with supervision;ambulating;regular height toilet Pt Will Perform Toileting - Clothing Manipulation and hygiene: with supervision;sit to/from stand  OT Frequency: Min 2X/week   Barriers to D/C:    none noted       Co-evaluation              AM-PAC OT "6 Clicks" Daily Activity     Outcome Measure Help from another person eating meals?: A Little Help from another person taking care of personal grooming?: A Little Help from another person toileting, which includes using toliet, bedpan, or urinal?: A Lot Help from another person bathing (including washing, rinsing, drying)?: A Lot Help from another person to put on and taking off regular upper body clothing?: A Little Help from another person to put on and taking off regular lower body clothing?: A Lot 6 Click Score: 15   End of Session Equipment Utilized During Treatment: Gait belt;Rolling walker Nurse Communication: Mobility status  Activity Tolerance: Patient tolerated treatment well Patient left: in chair;with call bell/phone within reach;with nursing/sitter in room  OT Visit Diagnosis: Unsteadiness on feet  (R26.81)                Time: 4270-6237 OT Time Calculation (min): 18 min Charges:  OT General Charges $OT Visit: 1 Visit OT Evaluation $OT Eval Moderate Complexity: 1 Mod  10/10/2020  Rich, OTR/L  Acute Rehabilitation Services  Office:  [phone]   Suzanna Obey 10/10/2020, 9:14 AM

## 2020-10-10 NOTE — Evaluation (Signed)
Physical Therapy Evaluation Patient Details Name: Kenneth [name] MRN: [id number] DOB: 1960-12-08 Today's Date: 10/10/2020   History of Present Illness  59 year old male with diabetes, hypertension, obesity and obstructive sleep apnea who was brought into the emergency department after he collapsed near fire department and was found to be in PEA arrest.  Intubated 1/12-1/14.  Clinical Impression  Pt admitted with above diagnosis. Pt was able to ambulate with RW but does have some loss of balance and needed cues for safety. States that his fiancee will be helping him at home and this PT recommends 24 hour care initially. Pt should progress well.   Pt currently with functional limitations due to the deficits listed below (see PT Problem List). Pt will benefit from skilled PT to increase their independence and safety with mobility to allow discharge to the venue listed below.      Follow Up Recommendations Home health PT;Supervision/Assistance - 24 hour    Equipment Recommendations  Rolling walker with 5" wheels;3in1 (PT)    Recommendations for Other Services       Precautions / Restrictions Precautions Precautions: Fall Restrictions Weight Bearing Restrictions: No      Mobility  Bed Mobility Overal bed mobility: Needs Assistance Bed Mobility: Supine to Sit     Supine to sit: Min assist;HOB elevated     General bed mobility comments: Pt in chair on arrival    Transfers Overall transfer level: Needs assistance Equipment used: Rolling walker (2 wheeled) Transfers: Sit to/from [employer] Sit to Stand: Min assist Stand pivot transfers: Min assist       General transfer comment: Pt needed assist to steady upon standing and a little assist to power up.  Ambulation/Gait Ambulation/Gait assistance: Min assist Gait Distance (Feet): 125 Feet Assistive device: Rolling walker (2 wheeled) Gait Pattern/deviations: Step-through pattern;Decreased stride  length;Shuffle;Trunk flexed;Drifts right/left;Wide base of support   Gait velocity interpretation: <1.31 ft/sec, indicative of household ambulator General Gait Details: Pt needed RW for support for balance. Pt needs cues for upright stance as he tends to flex forward especially as he fatigues.  Pt needed cues to stay close to RW as well. Pt needed min asisst with turns as he lost balance to right and needed assist to steady.  Pt unaware that he was fatiguing therefore needs cues to self monitor breathing and endurance.  Stairs            Wheelchair Mobility    Modified Rankin (Stroke Patients Only)       Balance Overall balance assessment: Needs assistance Sitting-balance support: No upper extremity supported;Feet supported Sitting balance-Leahy Scale: Fair     Standing balance support: Bilateral upper extremity supported Standing balance-Leahy Scale: Poor Standing balance comment: RW required for safety. Can stand statically without UE support but pt flexed and needed min guard assist. Dynamically, pt requires RW and external assist.                             Pertinent Vitals/Pain Pain Assessment: Faces Faces Pain Scale: Hurts little more Pain Location: sternal pain due to chest compressions. Pain Descriptors / Indicators: Tender Pain Intervention(s): Limited activity within patient's tolerance;Monitored during session;Repositioned    Home Living Family/patient expects to be discharged to:: Private residence Living Arrangements: Spouse/significant other (Lives with fiancee) Available Help at Discharge: Family;Available 24 hours/day Type of Home: Apartment Home Access: Level entry     Home Layout: One level Home Equipment: Shower seat  Prior Function Level of Independence: Independent         Comments: patient drives occasionally.     Hand Dominance   Dominant Hand: Right    Extremity/Trunk Assessment   Upper Extremity  Assessment Upper Extremity Assessment: Defer to OT evaluation    Lower Extremity Assessment Lower Extremity Assessment: Generalized weakness    Cervical / Trunk Assessment Cervical / Trunk Assessment: Normal  Communication   Communication: No difficulties  Cognition Arousal/Alertness: Awake/alert Behavior During Therapy: WFL for tasks assessed/performed Overall Cognitive Status: Within Functional Limits for tasks assessed                                        General Comments General comments (skin integrity, edema, etc.): 70-138  bpm, 100% 4L at rest, 156/74, sats with activity with monitor not picking up well therefore incr to 6L as pt was DOE 3/4.  Once back in room, sats were 95% on 4L.    Exercises General Exercises - Lower Extremity Ankle Circles/Pumps: AROM;Both;5 reps;Supine Long Arc Quad: AROM;Both;10 reps;Seated Hip Flexion/Marching: AROM;Both;10 reps;Seated   Assessment/Plan    PT Assessment Patient needs continued PT services  PT Problem List Decreased activity tolerance;Decreased balance;Decreased mobility;Decreased knowledge of use of DME;Decreased safety awareness;Decreased knowledge of precautions;Cardiopulmonary status limiting activity       PT Treatment Interventions Gait training;DME instruction;Functional mobility training;Therapeutic activities;Therapeutic exercise;Balance training;Patient/family education    PT Goals (Current goals can be found in the Care Plan section)  Acute Rehab PT Goals Patient Stated Goal: I want to move a little better PT Goal Formulation: With patient Time For Goal Achievement: 10/24/20 Potential to Achieve Goals: Good    Frequency Min 3X/week   Barriers to discharge        Co-evaluation               AM-PAC PT "6 Clicks" Mobility  Outcome Measure Help needed turning from your back to your side while in a flat bed without using bedrails?: A Little Help needed moving from lying on your back to  sitting on the side of a flat bed without using bedrails?: A Little Help needed moving to and from a bed to a chair (including a wheelchair)?: A Little Help needed standing up from a chair using your arms (e.g., wheelchair or bedside chair)?: A Little Help needed to walk in hospital room?: A Little Help needed climbing 3-5 steps with a railing? : A Little 6 Click Score: 18    End of Session Equipment Utilized During Treatment: Gait belt;Oxygen Activity Tolerance: Patient limited by fatigue Patient left: in chair;with call bell/phone within reach;with chair alarm set Nurse Communication: Mobility status PT Visit Diagnosis: Unsteadiness on feet (R26.81);Muscle weakness (generalized) (M62.81)    Time: 1829-9371 PT Time Calculation (min) (ACUTE ONLY): 18 min   Charges:   PT Evaluation $PT Eval Moderate Complexity: 1 Mod          Kyndal Heringer W,PT Acute Rehabilitation Services Pager:  [phone]  Office:  [phone]    Berline Lopes 10/10/2020, 11:05 AM

## 2020-10-10 NOTE — Progress Notes (Signed)
NAME:  Miliano Cotten, MRN:  [id number], DOB:  06/05/1961, LOS: 6 ADMISSION DATE:  10/03/2020, CONSULTATION DATE: 10/03/2020 REFERRING MD:  Sabino Donovan, MD, CHIEF COMPLAINT: Status post PEA cardiac arrest  Brief History:  60 y/o M admitted 1/12 with c/o flu-like symptoms with SOB and CP. 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  ROSC was achieved after 5 minutes and received 1 round of epi. CTA chest negative for PE.TTE wnl.  Arrest thought secondary to AECOPD + pseudomonal PNA.  COVID negative.  Extubated 1/14.  ICU course prolonged due to delirium (steroids + ICU related illness).   Past Medical History:  COPD - PFT 04/2020 FEV1 25%, ratio 42, FVC 48%, no significant bronchodilator response., Severe mid flow obstruction with reversibility, DLCO 50%.  Chronic Oxygen Dependence - 3L  CKD stage IIIa Obstructive sleep apnea Diabetes type 2 Hyperlipidemia Hypertension GERD Paroxysmal A. fib on apixaban  Significant Hospital Events:  1/12 Admit s/p PEA arrest  1/14 Extubated  1/18 Atrial fib with RVR (Rates from 117-130)  Consults:  PCCM  Procedures:  ETT 1/12 >> 1/14  Significant Diagnostic Tests:   CTA Chest 1/12 >> negative for PE, reflux of contrast into the IVC and hepatic veins c/w R heart dysfunction  CT Head 1/12 >> no acute intracranial abnormality   TTE 1/13 >> LVEF 55-60%, normal LV function, no RWMA, mild LVH, RV systolic function normal, small pericardial effusion, mild to moderate aortic valve stenosis   Micro Data:  COVID, Influenza A/B 1/12 >> negative  RVP 1/14 >> negative  Tracheal Aspirate 1/14 >> pseudomonas aeruginosa >> pan-sensitive   Antimicrobials:  Azithro 1/14 >> 1/16  Ceftriaxone 1/14 >> 1/16 Ceftazidime 1/16 >>  Interim History / Subjective:  Afebrile / WBC 13.5 O2 4L  Glucose range 137-140  I/O 830 ml UOP, -313 ml in last 24hr Pt reports chest wall discomfort from CPR   Objective   Blood  pressure (!) 165/73, pulse 70, temperature 98.3 F (36.8 C), temperature source Oral, resp. rate 18, height 6' (1.829 m), weight 91.4 kg, SpO2 100 %.    Vent Mode: BIPAP FiO2 (%):  [30 %] 30 % Set Rate:  [8 bmp] 8 bmp PEEP:  [8 cmH20] 8 cmH20   Intake/Output Summary (Last 24 hours) at 10/10/2020 0933 Last data filed at 10/10/2020 0800 Gross per 24 hour  Intake 646.34 ml  Output 865 ml  Net -218.66 ml   Filed Weights   10/03/20 2047 10/07/20 0449  Weight: 92.4 kg 91.4 kg    Examination: General: adult male sitting up in chair in NAD HEENT: MM pink/moist, [place] O2 (3L baseline), anicteric  Neuro: Awake, alert / oriented, speech clear, slight delay in answering questions but appropriate, MAE CV: s1s2 irr irr, AF on monitor 100's, no m/r/g PULM: non-labored on [place] O2, lungs bilaterally with wheezing GI: soft, bsx4 active  Extremities: warm/dry, no edema  Skin: no rashes or lesions  PCXR 1/19 >> images personally reviewed, mild vascular congestion, low lung volumes  Resolved Hospital Problem list     Assessment & Plan:   OOH PEA Arrest  Likely related to AECOPD, no obvious neurological sequelae on sedation wean.  Pseudomal PNA.  Suspected pulmonary leading to cardiac arrest.  -supportive care  -PT/OT   Acute on Chronic Hypoxemic and Hypercapneic Respiratory Failure AECOPD  Due to pseudomonas pneumonia of bilateral lower lobes, volume overloaded, AECOPD, improved -continue pulmicort + brovana while inpatient -  continue ceftazadime, D6/7. Stop date in place -solumedrol with stop date in place -aggressive pulmonary hygiene -IS, mobilize -Daliresp at discharge for suppression of exacerbation  -resume home pulmonary meds at discharge -would given 7 days prednisone 20 mg after completing IV steoids -pulmonary follow up at discharge -BiPAP QHS   AKI on CKD  Suspect element of AKI superimposed on CKD with arrest -Trend BMP / urinary output -Replace electrolytes as  indicated -Avoid nephrotoxic agents, ensure adequate renal perfusion  DM II  Last A1c 6.3% 09/13/20  -SSI, moderate scale  -HS coverage  Paroxysmal AF On Eliquis PTA -transition back to eliquis per pharmacy   HTNsive Urgency -continue coreg -resume home imdur  -resume home lasix at reduced function for now, follow renal indices  -PRN lopressor   Acute Metabolic Encephalopathy C/w ICU delirium, steroids. New overnight 1/16-1/17, on precedex, clonidine; some etoh use but does not seem c/w alcohol dependence.  Improved.  -stop clonidine patch  -significantly improved, PT efforts  -promote sleep / wake cycle   Best practice (evaluated daily)  Diet: advance as tolerated  Pain/Anxiety/Delirium protocol (if indicated): n/a  VAP protocol (if indicated): off DVT prophylaxis: lovenox full dose > transition back to eliquis GI prophylaxis: PPI Glucose control: SSI Mobility: BR Disposition: PCU, to [facility] [place] as of 1/20 am  Goals of Care:  Last date of multidisciplinary goals of care discussion: 1/14 Family and staff present:  RN, patient, sister Summary of discussion: understand advanced COPD, continue aggressive care Follow up goals of care discussion due: 1/23 Code Status: Full code   Canary Brim, MSN, NP-C, AGACNP-BC [facility] 10/10/2020, 10:29 AM   Please see [email or web address] for pager details.

## 2020-10-10 NOTE — Progress Notes (Signed)
ANTICOAGULATION CONSULT NOTE - Initial Consult  Pharmacy Consult for apixaban Indication: atrial fibrillation  Allergies  Allergen Reactions  . Lisinopril Swelling  . Other Other (See Comments)    Lettuce : rash  . Tomato Rash    Patient Measurements: Height: 6' (182.9 cm) Weight:  (Unable to obtain. Bed currently not able to weigh.) IBW/kg (Calculated) : 77.6  Vital Signs: Temp: 98.3 F (36.8 C) (01/19 0743) Temp Source: Oral (01/19 0743) BP: 138/95 (01/19 1000) Pulse Rate: 117 (01/19 1000)  Labs: Recent Labs    10/08/20 0104 10/09/20 0127 10/10/20 0217  HGB 10.1* 10.9* 11.1*  HCT 34.7* 37.0* 37.9*  PLT 219 223 259  CREATININE 1.28* 1.72* 1.60*    Estimated Creatinine Clearance: 54.6 mL/min (A) (by C-G formula based on SCr of 1.6 mg/dL (H)).   Medical History: Past Medical History:  Diagnosis Date  . Anxiety   . Arthritis   . Atrial fibrillation ([place])   . CHF (congestive heart failure) ([place])   . COPD (chronic obstructive pulmonary disease) ([facility])    emphysema  . Depression   . Diabetes mellitus without complication ([place])   . Heart murmur   . Hyperlipidemia   . Hypertension   . Sleep apnea with use of continuous positive airway pressure (CPAP)     Medications:  Medications Prior to Admission  Medication Sig Dispense Refill Last Dose  . acetaminophen (TYLENOL) 325 MG tablet Take 650 mg by mouth every 6 (six) hours as needed for mild pain or headache.     . albuterol (PROVENTIL) (2.5 MG/3ML) 0.083% nebulizer solution INHALE 1 VIAL VIA NEBULIZER 5 TIMES DAILY AS NEEDED FOR WHEEZING OR FOR SHORTNESS OF BREATH (Patient taking differently: Take 2.5 mg by nebulization See admin instructions. 5 times daily prn wheezing or shortness of breath) 150 mL 1   . apixaban (ELIQUIS) 5 MG TABS tablet Take 1 tablet (5 mg total) by mouth 2 (two) times daily. 180 tablet 1   . ARIPiprazole (ABILIFY) 5 MG tablet Take 5 mg by mouth daily.     . Blood Glucose Monitoring Suppl  (ONE TOUCH ULTRA 2) w/Device KIT Use to test blood sugars 1-2 times daily. 1 kit 0   . busPIRone (BUSPAR) 15 MG tablet Take 1 tablet (15 mg total) by mouth 2 (two) times daily. 180 tablet 1   . carvedilol (COREG) 12.5 MG tablet Take 1 tablet (12.5 mg total) by mouth 2 (two) times daily with a meal. 180 tablet 3   . DM-GG & DM-APAP-CPM (CORICIDIN HBP DAY/NIGHT COLD) 10-20 &15-200-2 MG MISC Take 1 tablet by mouth every 12 (twelve) hours as needed (cold symptoms).     . famotidine (PEPCID) 20 MG tablet Take 20 mg by mouth daily.     [name] FLUoxetine (PROZAC) 20 MG capsule TAKE 3 CAPSULES BY MOUTH  DAILY (Patient taking differently: Take 60 mg by mouth daily.) 270 capsule 3   . Fluticasone-Umeclidin-Vilant (TRELEGY ELLIPTA) 100-62.5-25 MCG/INH AEPB Inhale 1 puff into the lungs daily. 60 each 5   . furosemide (LASIX) 40 MG tablet Take 1 tablet (40 mg total) by mouth 2 (two) times daily. 60 tablet 11   . gabapentin (NEURONTIN) 300 MG capsule TAKE ONE CAPSULE BY MOUTH THREE TIMES DAILY (Patient taking differently: Take 300 mg by mouth 3 (three) times daily.) 90 capsule 2   . glucose blood (ONETOUCH ULTRA) test strip USE TO TEST BLOOD SUGAR 1-2 TIMES DAILY 100 each 11   . guaiFENesin (MUCINEX) 600 MG 12 hr  tablet Take 1 tablet (600 mg total) by mouth 2 (two) times daily. 30 tablet 0   . ipratropium-albuterol (DUONEB) 0.5-2.5 (3) MG/3ML SOLN Take 3 mLs by nebulization every 6 (six) hours as needed. 360 mL 0   . isosorbide mononitrate (IMDUR) 30 MG 24 hr tablet Take 30 mg by mouth daily.     . Lancets (ONETOUCH ULTRASOFT) lancets USE TO TEST BLOOD SUGAR 1-2 TIMES DAILY 100 each 11   . lubiprostone ([name]) 8 MCG capsule TAKE 1 CAPSULE(8 MCG) BY MOUTH TWICE DAILY WITH A MEAL (Patient taking differently: Take 8 mcg by mouth 2 (two) times daily with a meal.) 60 capsule 8   . Multiple Vitamin (MULTIVITAMIN WITH MINERALS) TABS tablet Take 1 tablet by mouth daily.     . nitroGLYCERIN (NITROSTAT) 0.4 MG SL tablet  DISSOLVE 1 TABLET UNDER THE TONGUE EVERY 5 MINUTES AS&nbsp;&nbsp;NEEDED FOR CHEST PAIN. MAX&nbsp;&nbsp;OF 3 TABLETS IN 15 MINUTES. CALL 911 IF PAIN PERSISTS. (Patient taking differently: Place 0.4 mg under the tongue every 5 (five) minutes as needed for chest pain.) 75 tablet 4   . pantoprazole (PROTONIX) 40 MG tablet TAKE ONE TABLET BY MOUTH EVERY MORNING (Patient taking differently: Take 40 mg by mouth daily.) 30 tablet 4   . rosuvastatin (CRESTOR) 20 MG tablet Take 1 tablet (20 mg total) by mouth daily. 30 tablet 3   . traZODone (DESYREL) 50 MG tablet TAKE 1/2 TO 1 TABLET BY  MOUTH AT BEDTIME AS NEEDED  FOR SLEEP. (Patient taking differently: Take 25-50 mg by mouth at bedtime as needed for sleep.) 30 tablet 11    Scheduled:  . apixaban  5 mg Oral BID  . arformoterol  15 mcg Nebulization BID  . ARIPiprazole  5 mg Oral Daily  . budesonide (PULMICORT) nebulizer solution  0.5 mg Nebulization BID  . busPIRone  15 mg Oral BID  . carvedilol  12.5 mg Oral BID WC  . chlorhexidine  15 mL Mouth Rinse BID  . Chlorhexidine Gluconate Cloth  6 each Topical Daily  . FLUoxetine  40 mg Oral Daily  . furosemide  40 mg Oral Daily  . gabapentin  300 mg Oral BID  . insulin aspart  0-15 Units Subcutaneous TID WC  . insulin aspart  0-5 Units Subcutaneous QHS  . ipratropium-albuterol  3 mL Nebulization BID  . isosorbide mononitrate  30 mg Oral Daily  . lidocaine  1 patch Transdermal Q24H  . mouth rinse  15 mL Mouth Rinse q12n4p  . methylPREDNISolone (SOLU-MEDROL) injection  40 mg Intravenous Q24H  . multivitamin with minerals  1 tablet Oral Daily  . pantoprazole  40 mg Oral Q1200  . [START ON 10/11/2020] rosuvastatin  20 mg Oral Daily   Infusions:  . sodium chloride    . cefTAZidime (FORTAZ)  IV Stopped (10/10/20 0610)   PRN: acetaminophen, albuterol, docusate sodium, hydrALAZINE, loperamide, metoprolol tartrate, polyethylene glycol, traMADol Anti-infectives (From admission, onward)   Start     Dose/Rate  Route Frequency Ordered Stop   10/08/20 1400  cefTAZidime (FORTAZ) 2 g in sodium chloride 0.9 % 100 mL IVPB        2 g 200 mL/hr over 30 Minutes Intravenous Every 8 hours 10/08/20 0931 10/11/20 2359   10/07/20 0930  ceFEPIme (MAXIPIME) 2 g in sodium chloride 0.9 % 100 mL IVPB  Status:  Discontinued        2 g 200 mL/hr over 30 Minutes Intravenous Every 8 hours 10/07/20 0830 10/08/20 0931   10/05/20 1000  cefTRIAXone (ROCEPHIN) 2 g in sodium chloride 0.9 % 100 mL IVPB  Status:  Discontinued        2 g 200 mL/hr over 30 Minutes Intravenous Daily 10/05/20 0918 10/07/20 0830   10/05/20 1000  azithromycin (ZITHROMAX) 500 mg in sodium chloride 0.9 % 250 mL IVPB  Status:  Discontinued        500 mg 250 mL/hr over 60 Minutes Intravenous Every 24 hours 10/05/20 0918 10/07/20 0830      Assessment: 60 yo male presents s/p PEA arrest. PTA the patient is on apixaban for atrial fibrillation. While admitted, the pt had poor oral intake and was transitioned to enoxaparin. The pts oral intake has improved and is stable. Pharmacy is consulted to stop enoxaparin and restart apixaban.  The last dose of enoxaparin was given 1/19 @ 1000. Will restart apixaban when the next enoxaparin dose would have been due. The patient does not meet 2 of the 3 dose reduction criteria, therefore, will initiate at the full dose.   Goal of Therapy:  Monitor platelets by anticoagulation protocol: Yes   Plan:  - Initiate apixaban 5 mg PO BID (starting 1/19 @ 2200) - Obtain a CBC every 72 hours - Monitor age, weight, and renal function for necessary dose adjustments - Monitor for signs and symptoms of bleeding  Shauna Hugh, PharmD, [facility]  PGY-1 Pharmacy Resident 10/10/2020 11:03 AM  Please check [email or web address] for unit-specific pharmacy phone numbers.

## 2020-10-11 DIAGNOSIS — I480 Paroxysmal atrial fibrillation: Secondary | ICD-10-CM

## 2020-10-11 DIAGNOSIS — E119 Type 2 diabetes mellitus without complications: Secondary | ICD-10-CM

## 2020-10-11 DIAGNOSIS — J449 Chronic obstructive pulmonary disease, unspecified: Secondary | ICD-10-CM

## 2020-10-11 DIAGNOSIS — G4733 Obstructive sleep apnea (adult) (pediatric): Secondary | ICD-10-CM

## 2020-10-11 DIAGNOSIS — J441 Chronic obstructive pulmonary disease with (acute) exacerbation: Secondary | ICD-10-CM

## 2020-10-11 LAB — CBC
HCT: 33.5 % — ABNORMAL LOW (ref 39.0–52.0)
Hemoglobin: 10.1 g/dL — ABNORMAL LOW (ref 13.0–17.0)
MCH: 28.2 pg (ref 26.0–34.0)
MCHC: 30.1 g/dL (ref 30.0–36.0)
MCV: 93.6 fL (ref 80.0–100.0)
Platelets: 221 10*3/uL (ref 150–400)
RBC: 3.58 MIL/uL — ABNORMAL LOW (ref 4.22–5.81)
RDW: 13.7 % (ref 11.5–15.5)
WBC: 11.2 10*3/uL — ABNORMAL HIGH (ref 4.0–10.5)
nRBC: 0 % (ref 0.0–0.2)

## 2020-10-11 LAB — GLUCOSE, CAPILLARY
Glucose-Capillary: 119 mg/dL — ABNORMAL HIGH (ref 70–99)
Glucose-Capillary: 248 mg/dL — ABNORMAL HIGH (ref 70–99)
Glucose-Capillary: 114 mg/dL — ABNORMAL HIGH (ref 70–99)
Glucose-Capillary: 168 mg/dL — ABNORMAL HIGH (ref 70–99)

## 2020-10-11 LAB — BASIC METABOLIC PANEL
Anion gap: 10 (ref 5–15)
BUN: 36 mg/dL — ABNORMAL HIGH (ref 6–20)
CO2: 31 mmol/L (ref 22–32)
Calcium: 9.2 mg/dL (ref 8.9–10.3)
Chloride: 102 mmol/L (ref 98–111)
Creatinine, Ser: 1.54 mg/dL — ABNORMAL HIGH (ref 0.61–1.24)
GFR, Estimated: 52 mL/min — ABNORMAL LOW (ref >60)
Glucose, Bld: 113 mg/dL — ABNORMAL HIGH (ref 70–99)
Potassium: 4 mmol/L (ref 3.5–5.1)
Sodium: 143 mmol/L (ref 135–145)

## 2020-10-11 LAB — MAGNESIUM: Magnesium: 2.2 mg/dL (ref 1.7–2.4)

## 2020-10-11 MED ORDER — GLUCERNA SHAKE PO LIQD
237.0000 mL | Freq: Three times a day (TID) | ORAL | Status: DC
  Administered 2020-10-11 – 2020-10-12 (×4): 237 mL via ORAL

## 2020-10-11 MED ORDER — PREDNISONE 20 MG PO TABS
40.0000 mg | ORAL_TABLET | Freq: Every day | ORAL | Status: AC
  Administered 2020-10-12 – 2020-10-13 (×2): 40 mg via ORAL
  Filled 2020-10-11 (×2): qty 2

## 2020-10-11 NOTE — Progress Notes (Signed)
Received call from CCMD that patient converted back to afib. Obtained 12 lead EKG which appears to be afib with RVR. Paged attending MD.

## 2020-10-11 NOTE — Progress Notes (Signed)
PROGRESS NOTE    Kenneth [name]   XBL:[id number]  DOB: June 25, 1961  DOA: 10/03/2020 PCP: Mliss Sax, MD   Brief Narrative:  Kenneth [name] is a 61 y/o male with COPD on 2 L O2 at home, OSA DM, HTN, PAF who presented to the ED with an out of hospital PEA arrest. ROSC was achieved after 5 min of CPR and 1 round of Epi. His wife stated that he was short of breath for a number of days prior to the above arrest and that he went to the fire dept for his symptoms where he collapsed and arrested.   He was extubated on 1/14 and transferred out of the ICU to [facility] Hospitalist service on 1/20.   Subjective: Sitting at the edge of the bed. Feels like breathing is better but still short of breath on exertion- has a moderate cough as well.     Assessment & Plan:   Principal Problem:   Cardiac arrest - PEA arrest s/p 5 min of CPR and Epi to regain ROSC  Active Problems:    Chronic obstructive pulmonary disease with exacerbation  Acute on chronic respiratory failure - has an FEV 1 of 25% - on 2 L O2 cheronically - extubated on 1/14  - 1/16> found to have "abundandant" pan sensitive pseudomonas on sputum culture- initially was received rocephin and azithromycin - transitioned to Cefepime on 1/16 and then to Ceftaz on 1/17- plan for 7 day course- stop date 1/22 - transition to Prednisone tomorrow - plan to dc home with Diliresp 250 mcg daily - possibly home in 1-2 days  ICU delirium - requiring Precedex - resolved    Controlled type 2 diabetes mellitus without complication, without long-term current use of insulin - cont SSI      PAF (paroxysmal atrial fibrillation) - cont Coreg & Eliquis       Time spent in minutes: 35 DVT prophylaxis: Eliquis Code Status: Full code Family Communication:  Disposition Plan:  Status is: Inpatient  Remains inpatient appropriate because:IV treatments appropriate due to intensity of illness or inability to take PO   Dispo: The patient is  from: Home              Anticipated d/c is to: Home              Anticipated d/c date is: 2 days              Patient currently is not medically stable to d/c.      Consultants:   PCCM Procedures:   Intubation/ extubation Antimicrobials:  Anti-infectives (From admission, onward)   Start     Dose/Rate Route Frequency Ordered Stop   10/08/20 1400  cefTAZidime (FORTAZ) 2 g in sodium chloride 0.9 % 100 mL IVPB        2 g 200 mL/hr over 30 Minutes Intravenous Every 8 hours 10/08/20 0931 10/13/20 2359   10/07/20 0930  ceFEPIme (MAXIPIME) 2 g in sodium chloride 0.9 % 100 mL IVPB  Status:  Discontinued        2 g 200 mL/hr over 30 Minutes Intravenous Every 8 hours 10/07/20 0830 10/08/20 0931   10/05/20 1000  cefTRIAXone (ROCEPHIN) 2 g in sodium chloride 0.9 % 100 mL IVPB  Status:  Discontinued        2 g 200 mL/hr over 30 Minutes Intravenous Daily 10/05/20 0918 10/07/20 0830   10/05/20 1000  azithromycin (ZITHROMAX) 500 mg in sodium chloride 0.9 % 250 mL IVPB  Status:  Discontinued        500 mg 250 mL/hr over 60 Minutes Intravenous Every 24 hours 10/05/20 0918 10/07/20 0830       Objective: Vitals:   10/11/20 0004 10/11/20 0020 10/11/20 0350 10/11/20 1229  BP:   (!) 178/79 (!) 145/69  Pulse:   75 72  Resp:   16 16  Temp:   98 F (36.7 C) 97.7 F (36.5 C)  TempSrc:   Oral Oral  SpO2:  100% 100% 100%  Weight: 88 kg     Height:        Intake/Output Summary (Last 24 hours) at 10/11/2020 1405 Last data filed at 10/11/2020 1200 Gross per 24 hour  Intake 1200 ml  Output 1450 ml  Net -250 ml   Filed Weights   10/07/20 0449 10/11/20 0004  Weight: 91.4 kg 88 kg    Examination: General exam: Appears comfortable  HEENT: PERRLA, oral mucosa moist, no sclera icterus or thrush Respiratory system: Clear to auscultation. Very poor air movement. Cardiovascular system: S1 & S2 heard, RRR.   Gastrointestinal system: Abdomen soft, non-tender, nondistended. Normal bowel  sounds. Central nervous system: Alert and oriented. No focal neurological deficits. Extremities: No cyanosis, clubbing or edema Skin: No rashes or ulcers Psychiatry:  Mood & affect appropriate.     Data Reviewed: I have personally reviewed following labs and imaging studies  CBC: Recent Labs  Lab 10/07/20 0109 10/08/20 0104 10/09/20 0127 10/10/20 0217 10/11/20 0426  WBC 12.0* 13.8* 13.1* 13.5* 11.2*  HGB 9.9* 10.1* 10.9* 11.1* 10.1*  HCT 31.9* 34.7* 37.0* 37.9* 33.5*  MCV 91.7 93.5 93.2 94.3 93.6  PLT 196 219 223 259 221   Basic Metabolic Panel: Recent Labs  Lab 10/05/20 1815 10/06/20 0108 10/06/20 0736 10/07/20 0109 10/08/20 0104 10/09/20 0127 10/10/20 0217 10/11/20 0426  NA 138 139   < > 139 138 140 140 143  K 4.0 4.1   < > 3.9 4.6 4.3 4.3 4.0  CL 98 99  --  97* 97* 100 102 102  CO2 20* 26  --  29 29 29 28 31   GLUCOSE 262* 250*  --  166* 243* 130* 137* 113*  BUN 15 16  --  23* 25* 42* 41* 36*  CREATININE 1.72* 1.62*  --  1.20 1.28* 1.72* 1.60* 1.54*  CALCIUM 8.3* 8.8*  --  8.9 9.2 9.5 9.3 9.2  MG 1.6* 2.9*  --  2.4 2.3 2.9* 2.5* 2.2  PHOS 4.0 3.1  --   --   --   --   --   --    < > = values in this interval not displayed.   GFR: Estimated Creatinine Clearance: 56.7 mL/min (A) (by C-G formula based on SCr of 1.54 mg/dL (H)). Liver Function Tests: Recent Labs  Lab 10/05/20 0126  AST 24  ALT 34  ALKPHOS 58  BILITOT 0.7  PROT 5.2*  ALBUMIN 2.7*   No results for input(s): LIPASE, AMYLASE in the last 168 hours. No results for input(s): AMMONIA in the last 168 hours. Coagulation Profile: No results for input(s): INR, PROTIME in the last 168 hours. Cardiac Enzymes: No results for input(s): CKTOTAL, CKMB, CKMBINDEX, TROPONINI in the last 168 hours. BNP (last 3 results) No results for input(s): PROBNP in the last 8760 hours. HbA1C: No results for input(s): HGBA1C in the last 72 hours. CBG: Recent Labs  Lab 10/10/20 1137 10/10/20 1610 10/10/20 2133  10/11/20 0604 10/11/20 1225  GLUCAP 162* 173* 96  119* 248*   Lipid Profile: No results for input(s): CHOL, HDL, LDLCALC, TRIG, CHOLHDL, LDLDIRECT in the last 72 hours. Thyroid Function Tests: No results for input(s): TSH, T4TOTAL, FREET4, T3FREE, THYROIDAB in the last 72 hours. Anemia Panel: No results for input(s): VITAMINB12, FOLATE, FERRITIN, TIBC, IRON, RETICCTPCT in the last 72 hours. Urine analysis:    Component Value Date/Time   COLORURINE AMBER (A) 10/03/2020 2036   APPEARANCEUR CLOUDY (A) 10/03/2020 2036   LABSPEC 1.016 10/03/2020 2036   PHURINE 6.0 10/03/2020 2036   GLUCOSEU >=500 (A) 10/03/2020 2036   GLUCOSEU NEGATIVE 05/20/2019 0825   HGBUR NEGATIVE 10/03/2020 2036   BILIRUBINUR NEGATIVE 10/03/2020 2036   KETONESUR NEGATIVE 10/03/2020 2036   PROTEINUR >=300 (A) 10/03/2020 2036   UROBILINOGEN 1.0 05/20/2019 0825   NITRITE NEGATIVE 10/03/2020 2036   LEUKOCYTESUR NEGATIVE 10/03/2020 2036   Sepsis Labs: @LABRCNTIP (procalcitonin:4,lacticidven:4) ) Recent Results (from the past 240 hour(s))  Resp Panel by RT-PCR (Flu A&B, Covid) Nasopharyngeal Swab     Status: None   Collection Time: 10/03/20  8:20 PM   Specimen: Nasopharyngeal Swab; Nasopharyngeal(NP) swabs in vial transport medium  Result Value Ref Range Status   SARS Coronavirus 2 by RT PCR NEGATIVE NEGATIVE Final    Comment: (NOTE) SARS-CoV-2 target nucleic acids are NOT DETECTED.  The SARS-CoV-2 RNA is generally detectable in upper respiratory specimens during the acute phase of infection. The lowest concentration of SARS-CoV-2 viral copies this assay can detect is 138 copies/mL. A negative result does not preclude SARS-Cov-2 infection and should not be used as the sole basis for treatment or other patient management decisions. A negative result may occur with  improper specimen collection/handling, submission of specimen other than nasopharyngeal swab, presence of viral mutation(s) within the areas  targeted by this assay, and inadequate number of viral copies(<138 copies/mL). A negative result must be combined with clinical observations, patient history, and epidemiological information. The expected result is Negative.  Fact Sheet for Patients:  BloggerCourse.com  Fact Sheet for Healthcare Providers:  SeriousBroker.it  This test is no t yet approved or cleared by the [place] FDA and  has been authorized for detection and/or diagnosis of SARS-CoV-2 by FDA under an Emergency Use Authorization (EUA). This EUA will remain  in effect (meaning this test can be used) for the duration of the COVID-19 declaration under Section 564(b)(1) of the Act, 21 U.S.C.section 360bbb-3(b)(1), unless the authorization is terminated  or revoked sooner.       Influenza A by PCR NEGATIVE NEGATIVE Final   Influenza B by PCR NEGATIVE NEGATIVE Final    Comment: (NOTE) The Xpert Xpress SARS-CoV-2/FLU/RSV plus assay is intended as an aid in the diagnosis of influenza from Nasopharyngeal swab specimens and should not be used as a sole basis for treatment. Nasal washings and aspirates are unacceptable for Xpert Xpress SARS-CoV-2/FLU/RSV testing.  Fact Sheet for Patients: BloggerCourse.com  Fact Sheet for Healthcare Providers: SeriousBroker.it  This test is not yet approved or cleared by the [place] FDA and has been authorized for detection and/or diagnosis of SARS-CoV-2 by FDA under an Emergency Use Authorization (EUA). This EUA will remain in effect (meaning this test can be used) for the duration of the COVID-19 declaration under Section 564(b)(1) of the Act, 21 U.S.C. section 360bbb-3(b)(1), unless the authorization is terminated or revoked.  Performed at [facility], [address], [place], [place] [address]   MRSA PCR Screening     Status: None   Collection Time: 10/04/20  1:45 AM   Specimen: Nasal Mucosa; Nasopharyngeal  Result Value Ref Range Status   MRSA by PCR NEGATIVE NEGATIVE Final    Comment:        The GeneXpert MRSA Assay (FDA approved for NASAL specimens only), is one component of a comprehensive MRSA colonization surveillance program. It is not intended to diagnose MRSA infection nor to guide or monitor treatment for MRSA infections. Performed at [facility], [address] [place], [place] [address]   Respiratory (~20 pathogens) panel by PCR     Status: None   Collection Time: 10/05/20  3:20 AM   Specimen: Tracheal Aspirate; Respiratory  Result Value Ref Range Status   Adenovirus NOT DETECTED NOT DETECTED Final   Coronavirus 229E NOT DETECTED NOT DETECTED Final    Comment: (NOTE) The Coronavirus on the Respiratory Panel, DOES NOT test for the novel  Coronavirus (2019 nCoV)    Coronavirus HKU1 NOT DETECTED NOT DETECTED Final   Coronavirus NL63 NOT DETECTED NOT DETECTED Final   Coronavirus OC43 NOT DETECTED NOT DETECTED Final   Metapneumovirus NOT DETECTED NOT DETECTED Final   Rhinovirus / Enterovirus NOT DETECTED NOT DETECTED Final   Influenza A NOT DETECTED NOT DETECTED Final   Influenza B NOT DETECTED NOT DETECTED Final   Parainfluenza Virus 1 NOT DETECTED NOT DETECTED Final   Parainfluenza Virus 2 NOT DETECTED NOT DETECTED Final   Parainfluenza Virus 3 NOT DETECTED NOT DETECTED Final   Parainfluenza Virus 4 NOT DETECTED NOT DETECTED Final   Respiratory Syncytial Virus NOT DETECTED NOT DETECTED Final   Bordetella pertussis NOT DETECTED NOT DETECTED Final   Bordetella Parapertussis NOT DETECTED NOT DETECTED Final   Chlamydophila pneumoniae NOT DETECTED NOT DETECTED Final   Mycoplasma pneumoniae NOT DETECTED NOT DETECTED Final    Comment: Performed at [facility], [address] [place], [place] [address]  Culture, respiratory (non-expectorated)     Status: None   Collection Time: 10/05/20 10:15 AM   Specimen:  Tracheal Aspirate; Respiratory  Result Value Ref Range Status   Specimen Description TRACHEAL ASPIRATE  Final   Special Requests NONE  Final   Gram Stain   Final    ABUNDANT WBC PRESENT,BOTH PMN AND MONONUCLEAR FEW GRAM NEGATIVE RODS Performed at [facility], [address] [place], [place] [address]    Culture ABUNDANT PSEUDOMONAS AERUGINOSA  Final   Report Status 10/07/2020 FINAL  Final   Organism ID, Bacteria PSEUDOMONAS AERUGINOSA  Final      Susceptibility   Pseudomonas aeruginosa - MIC*    CEFTAZIDIME 4 SENSITIVE Sensitive     CIPROFLOXACIN 0.5 SENSITIVE Sensitive     GENTAMICIN 2 SENSITIVE Sensitive     IMIPENEM 2 SENSITIVE Sensitive     PIP/TAZO 8 SENSITIVE Sensitive     CEFEPIME 2 SENSITIVE Sensitive     * ABUNDANT PSEUDOMONAS AERUGINOSA         Radiology Studies: DG CHEST PORT 1 VIEW  Result Date: 10/10/2020 CLINICAL DATA:  Shortness of breath.  Pleural effusion. EXAM: PORTABLE CHEST 1 VIEW COMPARISON:  10/09/2020. FINDINGS: Mediastinum and hilar structures normal. Cardiomegaly. Mild pulmonary venous congestion. Low lung volumes persistent bibasilar atelectasis/infiltrates. No pleural effusion or pneumothorax. Left costophrenic incompletely imaged. Thoracic spine scoliosis. IMPRESSION: 1. Cardiomegaly with mild pulmonary venous congestion. 2. Low lung volumes with persistent bibasilar atelectasis/infiltrates. Electronically Signed   By: Maisie Fushomas  Register   On: 10/10/2020 07:59      Scheduled Meds: . apixaban  5 mg Oral  BID  . ARIPiprazole  5 mg Oral Daily  . busPIRone  15 mg Oral BID  . carvedilol  12.5 mg Oral BID WC  . chlorhexidine  15 mL Mouth Rinse BID  . Chlorhexidine Gluconate Cloth  6 each Topical Daily  . FLUoxetine  40 mg Oral Daily  . fluticasone furoate-vilanterol  1 puff Inhalation Daily  . furosemide  40 mg Oral Daily  . gabapentin  300 mg Oral BID  . insulin aspart  0-15 Units Subcutaneous TID WC  . insulin aspart  0-5 Units Subcutaneous  QHS  . isosorbide mononitrate  30 mg Oral Daily  . lidocaine  1 patch Transdermal Q24H  . mouth rinse  15 mL Mouth Rinse q12n4p  . multivitamin with minerals  1 tablet Oral Daily  . pantoprazole  40 mg Oral Q1200  . [START ON 10/12/2020] predniSONE  40 mg Oral Q breakfast  . rosuvastatin  20 mg Oral Daily  . umeclidinium bromide  1 puff Inhalation Daily   Continuous Infusions: . sodium chloride    . cefTAZidime (FORTAZ)  IV 2 g (10/11/20 1335)     LOS: 7 days      Calvert Cantor, MD [facility] Pager: [email or web address] 10/11/2020, 2:05 PM

## 2020-10-11 NOTE — Progress Notes (Signed)
Nutrition Follow-up  DOCUMENTATION CODES:   Not applicable  INTERVENTION:   -MVI with minerals daily -Glucerna Shake po TID, each supplement provides 220 kcal and 10 grams of protein  NUTRITION DIAGNOSIS:   Inadequate oral intake related to acute illness as evidenced by NPO status.  Progressing; advanced to carb modified, heart healthy diet on 10/09/20  GOAL:   Patient will meet greater than or equal to 90% of their needs  Progressing  MONITOR:   PO intake,Supplement acceptance,Labs,Weight trends,Skin,I & O's  REASON FOR ASSESSMENT:   Consult,Ventilator Enteral/tube feeding initiation and management  ASSESSMENT:   60 yo male admitted with PEA arrest likely related to AECOPD requiring intubation, AKI on CKD. PMH COPD, CKD, DM, HLD, HTN, GERD  1/14- extubated 1/17- transitioned from Bi-ap to nasal cannula 1/18- advanced to heart healthy, carb modified diet  Reviewed I/O's: -147 ml x 24 hours and -1.8 L since admission  UOP: 1.5 L x 24 hours  Spoke with pt at bedside, who was pleasant and in good spirits today. He shares that he appetite has been poor over the past few days (noted meal completion 10-20%), however, this is improving. Observed pt breakfast tray, which he consumed 75% (all but the sausage). PTA pt reports good appetite. He typically consumes 2 meals per day (baked protein, vegetables, and a starch); he shares that his fiance cooks for him and is diligent about baking his meats. He also snacks on potato chips and raisin bread throughout the day.   Pt denies any wt loss. Per pt, UBW is around 195-200#. Reviewed wt hx; wt has been stable over the past 3 months.  Discussed with pt importance of good meal and supplement intake to promote healing. Pt had no further questions at this time, but expressed appreciation for visit.   Medications reviewed and include lasix and prednisone.   Labs reviewed: CBGS: 114-248 (inpatient orders for glycemic control are 0-15  units insulin aspart TID with meals and 0-5 units insulin aspart daily at bedtime).   NUTRITION - FOCUSED PHYSICAL EXAM:  Flowsheet Row Most Recent Value  Orbital Region No depletion  Upper Arm Region No depletion  Thoracic and Lumbar Region No depletion  Buccal Region No depletion  Temple Region No depletion  Clavicle Bone Region No depletion  Clavicle and Acromion Bone Region No depletion  Scapular Bone Region No depletion  Dorsal Hand No depletion  Patellar Region No depletion  Anterior Thigh Region No depletion  Posterior Calf Region No depletion  Edema (RD Assessment) Mild  Hair Reviewed  Eyes Reviewed  Mouth Reviewed  Skin Reviewed  Nails Reviewed       Diet Order:   Diet Order            Diet heart healthy/carb modified Room service appropriate? Yes; Fluid consistency: Thin  Diet effective now                 EDUCATION NEEDS:   Education needs have been addressed  Skin:  Skin Assessment: Reviewed RN Assessment  Last BM:  10/09/20  Height:   Ht Readings from Last 1 Encounters:  10/04/20 6' (1.829 m)    Weight:   Wt Readings from Last 1 Encounters:  10/11/20 88 kg    Ideal Body Weight:  80.9 kg  BMI:  Body mass index is 26.3 kg/m.  Estimated Nutritional Needs:   Kcal:  2200-2400  Protein:  115-130 grams  Fluid:  > 2 L    Levada Schilling, RD, LDN,  CDCES Registered Dietitian II Certified Diabetes Care and Education Specialist Please refer to [facility] for RD and/or RD on-call/weekend/after hours pager

## 2020-10-11 NOTE — Care Management Important Message (Signed)
Important Message  Patient Details  Name: Kenneth [name] MRN: [id number] Date of Birth: 1960-12-21   Medicare Important Message Given:  Yes     Renie Ora 10/11/2020, 10:20 AM

## 2020-10-12 ENCOUNTER — Ambulatory Visit: Payer: Medicare Other | Admitting: Adult Health

## 2020-10-12 DIAGNOSIS — J151 Pneumonia due to Pseudomonas: Secondary | ICD-10-CM

## 2020-10-12 LAB — GLUCOSE, CAPILLARY
Glucose-Capillary: 153 mg/dL — ABNORMAL HIGH (ref 70–99)
Glucose-Capillary: 145 mg/dL — ABNORMAL HIGH (ref 70–99)
Glucose-Capillary: 267 mg/dL — ABNORMAL HIGH (ref 70–99)
Glucose-Capillary: 254 mg/dL — ABNORMAL HIGH (ref 70–99)

## 2020-10-12 MED ORDER — CARVEDILOL 25 MG PO TABS
25.0000 mg | ORAL_TABLET | Freq: Two times a day (BID) | ORAL | Status: DC
Start: 2020-10-12 — End: 2020-10-13
  Administered 2020-10-12 – 2020-10-13 (×2): 25 mg via ORAL
  Filled 2020-10-12 (×2): qty 1

## 2020-10-12 NOTE — Progress Notes (Addendum)
Physical Therapy Treatment Patient Details Name: Kenneth [name] MRN: [id number] DOB: 01/12/1961 Today's Date: 10/12/2020    History of Present Illness 60 year old male with diabetes, hypertension, obesity and obstructive sleep apnea who was brought into the emergency department after he collapsed near fire department and was found to be in PEA arrest.  Intubated 1/12-1/14.    PT Comments    Pt supine in bed on arrival and eager to participate in PT session.  Pt is making great functional gains this session and tolerating increased activity.  Continue with plan to return home with f/u HHPT at d/c.      Follow Up Recommendations  Home health PT;Supervision/Assistance - 24 hour     Equipment Recommendations  Rolling walker with 5" wheels;3in1 (PT)    Recommendations for Other Services       Precautions / Restrictions Precautions Precautions: Fall Restrictions Weight Bearing Restrictions: No    Mobility  Bed Mobility Overal bed mobility: Needs Assistance Bed Mobility: Supine to Sit     Supine to sit: Modified independent (Device/Increase time)        Transfers Overall transfer level: Needs assistance Equipment used: Rolling walker (2 wheeled) Transfers: Sit to/from Stand Sit to Stand: Supervision         General transfer comment: Cues for hand placement to and from seated surface.  Ambulation/Gait Ambulation/Gait assistance: Min guard Gait Distance (Feet): 300 Feet Assistive device: Rolling walker (2 wheeled) Gait Pattern/deviations: Step-through pattern;Decreased stride length;Trunk flexed     General Gait Details: Cues for upper trunk control and pacing.  2L [place] - 97%.  Pt able to increase gt distance with only mild fatigue at end of gt trial.   Stairs             Wheelchair Mobility    Modified Rankin (Stroke Patients Only)       Balance Overall balance assessment: Needs assistance Sitting-balance support: No upper extremity supported;Feet  supported Sitting balance-Leahy Scale: Fair     Standing balance support: Bilateral upper extremity supported Standing balance-Leahy Scale: Fair                              Cognition Arousal/Alertness: Awake/alert Behavior During Therapy: WFL for tasks assessed/performed Overall Cognitive Status: Within Functional Limits for tasks assessed                                        Exercises      General Comments        Pertinent Vitals/Pain Pain Assessment: Faces Faces Pain Scale: Hurts little more Pain Location: sternal pain due to chest compressions. Pain Descriptors / Indicators: Tender Pain Intervention(s): Monitored during session;Repositioned    Home Living                      Prior Function            PT Goals (current goals can now be found in the care plan section) Acute Rehab PT Goals Patient Stated Goal: I want to move a little better Potential to Achieve Goals: Good Progress towards PT goals: Progressing toward goals    Frequency    Min 3X/week      PT Plan Current plan remains appropriate    Co-evaluation              AM-PAC PT "  6 Clicks" Mobility   Outcome Measure  Help needed turning from your back to your side while in a flat bed without using bedrails?: A Little Help needed moving from lying on your back to sitting on the side of a flat bed without using bedrails?: A Little Help needed moving to and from a bed to a chair (including a wheelchair)?: A Little Help needed standing up from a chair using your arms (e.g., wheelchair or bedside chair)?: A Little Help needed to walk in hospital room?: A Little Help needed climbing 3-5 steps with a railing? : A Little 6 Click Score: 18    End of Session Equipment Utilized During Treatment: Gait belt;Oxygen Activity Tolerance: Patient limited by fatigue Patient left: in chair;with call bell/phone within reach;with chair alarm set Nurse Communication:  Mobility status PT Visit Diagnosis: Unsteadiness on feet (R26.81);Muscle weakness (generalized) (M62.81)     Time: 7342-8768 PT Time Calculation (min) (ACUTE ONLY): 36 min  Charges:  $Gait Training: 8-22 mins $Therapeutic Activity: 8-22 mins                     Bonney Leitz , PTA Acute Rehabilitation Services Pager [phone] Office [phone]     Kenneth [name] Artis Delay 10/12/2020, 2:27 PM

## 2020-10-12 NOTE — Progress Notes (Signed)
OT Cancellation Note  Patient Details Name: Quame Spratlin MRN: [id number] DOB: 1961-05-19   Cancelled Treatment:    Reason Eval/Treat Not Completed: Patient declined, just finished with PT, OT will stop back as time allows.    Maudry Zeidan D Cecile Gillispie 10/12/2020, 2:48 PM

## 2020-10-12 NOTE — Progress Notes (Signed)
PROGRESS NOTE    Kenneth [name]   ULA:[id number]  DOB: 07/06/1961  DOA: 10/03/2020 PCP: Mliss Sax, MD   Brief Narrative:  Kenneth [name] is a 60 y/o male with COPD on 2 L O2 at home, OSA DM, HTN, PAF who presented to the ED with an out of hospital PEA arrest. ROSC was achieved after 5 min of CPR and 1 round of Epi. His wife stated that he was short of breath for a number of days prior to the above arrest and that he went to the fire dept for his symptoms where he collapsed and arrested.   He was extubated on 1/14 and transferred out of the ICU to [facility] service on 1/20.   Subjective: Became very short of breath with ambulating. Per RN he was struggling to breath and was only able to walk about 50 feet.     Assessment & Plan:   Principal Problem:   Cardiac arrest - PEA arrest s/p 5 min of CPR and Epi to regain ROSC - 2 d ECHO revealed normal EF, mild LVH and indeterminate diastolic parameters.   Active Problems:    Chronic obstructive pulmonary disease with exacerbation  Acute on chronic respiratory failure - has an FEV 1 of 25% and is on 2 L O2 chronically - extubated on 1/14  - 1/16> found to have "abundandant" pan sensitive pseudomonas on sputum culture- initially was received rocephin and azithromycin - transitioned to Cefepime on 1/16 and then to Ceftaz on 1/17- plan for 7 day course- stop date 1/22 - transitioning to Prednisone today - plan to dc home with Diliresp 250 mcg daily - He is not ready to go home today due to severe dyspnea on mild/moderate exertion  ICU delirium - requiring Precedex - resolved    Controlled type 2 diabetes mellitus without complication, without long-term current use of insulin - cont SSI      PAF (paroxysmal atrial fibrillation) - cont Coreg & Eliquis       Time spent in minutes: 35 DVT prophylaxis: Eliquis Code Status: Full code Family Communication:  Disposition Plan:  Status is: Inpatient  Remains  inpatient appropriate because:IV treatments appropriate due to intensity of illness or inability to take PO   Dispo: The patient is from: Home              Anticipated d/c is to: Home              Anticipated d/c date is: 2 days              Patient currently is not medically stable to d/c.   Consultants:   PCCM Procedures:   Intubation/ extubation Antimicrobials:  Anti-infectives (From admission, onward)   Start     Dose/Rate Route Frequency Ordered Stop   10/08/20 1400  cefTAZidime (FORTAZ) 2 g in sodium chloride 0.9 % 100 mL IVPB        2 g 200 mL/hr over 30 Minutes Intravenous Every 8 hours 10/08/20 0931 10/13/20 2359   10/07/20 0930  ceFEPIme (MAXIPIME) 2 g in sodium chloride 0.9 % 100 mL IVPB  Status:  Discontinued        2 g 200 mL/hr over 30 Minutes Intravenous Every 8 hours 10/07/20 0830 10/08/20 0931   10/05/20 1000  cefTRIAXone (ROCEPHIN) 2 g in sodium chloride 0.9 % 100 mL IVPB  Status:  Discontinued        2 g 200 mL/hr over 30 Minutes Intravenous Daily 10/05/20 0918  10/07/20 0830   10/05/20 1000  azithromycin (ZITHROMAX) 500 mg in sodium chloride 0.9 % 250 mL IVPB  Status:  Discontinued        500 mg 250 mL/hr over 60 Minutes Intravenous Every 24 hours 10/05/20 0918 10/07/20 0830       Objective: Vitals:   10/12/20 0414 10/12/20 0727 10/12/20 0742 10/12/20 1025  BP: (!) 182/71  (!) 181/78 (!) 150/98  Pulse: 68 72 68 68  Resp: 20 18 18    Temp: (!) 97.3 F (36.3 C)  98.1 F (36.7 C) 98.2 F (36.8 C)  TempSrc: Oral  Oral Oral  SpO2: 100% 98% 100% 95%  Weight:      Height:        Intake/Output Summary (Last 24 hours) at 10/12/2020 1119 Last data filed at 10/12/2020 10/14/2020 Gross per 24 hour  Intake 1220 ml  Output 350 ml  Net 870 ml   Filed Weights   10/11/20 0004 10/12/20 0028  Weight: 88 kg 87.3 kg    Examination: General exam: Appears comfortable  HEENT: PERRLA, oral mucosa moist, no sclera icterus or thrush Respiratory system: b/l rhonchi  today. Respiratory effort normal. Cardiovascular system: S1 & S2 heard,  No murmurs  Gastrointestinal system: Abdomen soft, non-tender, nondistended. Normal bowel sounds   Central nervous system: Alert and oriented. No focal neurological deficits. Extremities: No cyanosis, clubbing or edema Skin: No rashes or ulcers Psychiatry:  Mood & affect appropriate.     Data Reviewed: I have personally reviewed following labs and imaging studies  CBC: Recent Labs  Lab 10/07/20 0109 10/08/20 0104 10/09/20 0127 10/10/20 0217 10/11/20 0426  WBC 12.0* 13.8* 13.1* 13.5* 11.2*  HGB 9.9* 10.1* 10.9* 11.1* 10.1*  HCT 31.9* 34.7* 37.0* 37.9* 33.5*  MCV 91.7 93.5 93.2 94.3 93.6  PLT 196 219 223 259 221   Basic Metabolic Panel: Recent Labs  Lab 10/05/20 1815 10/06/20 0108 10/06/20 0736 10/07/20 0109 10/08/20 0104 10/09/20 0127 10/10/20 0217 10/11/20 0426  NA 138 139   < > 139 138 140 140 143  K 4.0 4.1   < > 3.9 4.6 4.3 4.3 4.0  CL 98 99  --  97* 97* 100 102 102  CO2 20* 26  --  29 29 29 28 31   GLUCOSE 262* 250*  --  166* 243* 130* 137* 113*  BUN 15 16  --  23* 25* 42* 41* 36*  CREATININE 1.72* 1.62*  --  1.20 1.28* 1.72* 1.60* 1.54*  CALCIUM 8.3* 8.8*  --  8.9 9.2 9.5 9.3 9.2  MG 1.6* 2.9*  --  2.4 2.3 2.9* 2.5* 2.2  PHOS 4.0 3.1  --   --   --   --   --   --    < > = values in this interval not displayed.   GFR: Estimated Creatinine Clearance: 56.7 mL/min (A) (by C-G formula based on SCr of 1.54 mg/dL (H)). Liver Function Tests: No results for input(s): AST, ALT, ALKPHOS, BILITOT, PROT, ALBUMIN in the last 168 hours. No results for input(s): LIPASE, AMYLASE in the last 168 hours. No results for input(s): AMMONIA in the last 168 hours. Coagulation Profile: No results for input(s): INR, PROTIME in the last 168 hours. Cardiac Enzymes: No results for input(s): CKTOTAL, CKMB, CKMBINDEX, TROPONINI in the last 168 hours. BNP (last 3 results) No results for input(s): PROBNP in the last  8760 hours. HbA1C: No results for input(s): HGBA1C in the last 72 hours. CBG: Recent Labs  Lab  10/11/20 0604 10/11/20 1225 10/11/20 1636 10/11/20 2125 10/12/20 0606  GLUCAP 119* 248* 114* 168* 153*   Lipid Profile: No results for input(s): CHOL, HDL, LDLCALC, TRIG, CHOLHDL, LDLDIRECT in the last 72 hours. Thyroid Function Tests: No results for input(s): TSH, T4TOTAL, FREET4, T3FREE, THYROIDAB in the last 72 hours. Anemia Panel: No results for input(s): VITAMINB12, FOLATE, FERRITIN, TIBC, IRON, RETICCTPCT in the last 72 hours. Urine analysis:    Component Value Date/Time   COLORURINE AMBER (A) 10/03/2020 2036   APPEARANCEUR CLOUDY (A) 10/03/2020 2036   LABSPEC 1.016 10/03/2020 2036   PHURINE 6.0 10/03/2020 2036   GLUCOSEU >=500 (A) 10/03/2020 2036   GLUCOSEU NEGATIVE 05/20/2019 0825   HGBUR NEGATIVE 10/03/2020 2036   BILIRUBINUR NEGATIVE 10/03/2020 2036   KETONESUR NEGATIVE 10/03/2020 2036   PROTEINUR >=300 (A) 10/03/2020 2036   UROBILINOGEN 1.0 05/20/2019 0825   NITRITE NEGATIVE 10/03/2020 2036   LEUKOCYTESUR NEGATIVE 10/03/2020 2036   Sepsis Labs: @LABRCNTIP (procalcitonin:4,lacticidven:4) ) Recent Results (from the past 240 hour(s))  Resp Panel by RT-PCR (Flu A&B, Covid) Nasopharyngeal Swab     Status: None   Collection Time: 10/03/20  8:20 PM   Specimen: Nasopharyngeal Swab; Nasopharyngeal(NP) swabs in vial transport medium  Result Value Ref Range Status   SARS Coronavirus 2 by RT PCR NEGATIVE NEGATIVE Final    Comment: (NOTE) SARS-CoV-2 target nucleic acids are NOT DETECTED.  The SARS-CoV-2 RNA is generally detectable in upper respiratory specimens during the acute phase of infection. The lowest concentration of SARS-CoV-2 viral copies this assay can detect is 138 copies/mL. A negative result does not preclude SARS-Cov-2 infection and should not be used as the sole basis for treatment or other patient management decisions. A negative result may occur with   improper specimen collection/handling, submission of specimen other than nasopharyngeal swab, presence of viral mutation(s) within the areas targeted by this assay, and inadequate number of viral copies(<138 copies/mL). A negative result must be combined with clinical observations, patient history, and epidemiological information. The expected result is Negative.  Fact Sheet for Patients:  12/01/20  Fact Sheet for Healthcare Providers:  BloggerCourse.com  This test is no t yet approved or cleared by the [employer] FDA and  has been authorized for detection and/or diagnosis of SARS-CoV-2 by FDA under an Emergency Use Authorization (EUA). This EUA will remain  in effect (meaning this test can be used) for the duration of the COVID-19 declaration under Section 564(b)(1) of the Act, 21 U.S.C.section 360bbb-3(b)(1), unless the authorization is terminated  or revoked sooner.       Influenza A by PCR NEGATIVE NEGATIVE Final   Influenza B by PCR NEGATIVE NEGATIVE Final    Comment: (NOTE) The Xpert Xpress SARS-CoV-2/FLU/RSV plus assay is intended as an aid in the diagnosis of influenza from Nasopharyngeal swab specimens and should not be used as a sole basis for treatment. Nasal washings and aspirates are unacceptable for Xpert Xpress SARS-CoV-2/FLU/RSV testing.  Fact Sheet for Patients: [place]  Fact Sheet for Healthcare Providers: BloggerCourse.com  This test is not yet approved or cleared by the [employer]ious[employer] FDA and has been authorized for detection and/or diagnosis of SARS-CoV-2 by FDA under an Emergency Use Authorization (EUA). This EUA will remain in effect (meaning this test can be used) for the duration of the COVID-19 declaration under Section 564(b)(1) of the Act, 21 U.S.C. section 360bbb-3(b)(1), unless the authorization is terminated  or revoked.  Performed at [facility], [address], [place], [place]  MRSA PCR Screening     Status: None   Collection Time: 10/04/20  1:45 AM   Specimen: Nasal Mucosa; Nasopharyngeal  Result Value Ref Range Status   MRSA by PCR NEGATIVE NEGATIVE Final    Comment:        The GeneXpert MRSA Assay (FDA approved for NASAL specimens only), is one component of a comprehensive MRSA colonization surveillance program. It is not intended to diagnose MRSA infection nor to guide or monitor treatment for MRSA infections. Performed at [facility], [address], [place], [place] [address]   Respiratory (~20 pathogens) panel by PCR     Status: None   Collection Time: 10/05/20  3:20 AM   Specimen: Tracheal Aspirate; Respiratory  Result Value Ref Range Status   Adenovirus NOT DETECTED NOT DETECTED Final   Coronavirus 229E NOT DETECTED NOT DETECTED Final    Comment: (NOTE) The Coronavirus on the Respiratory Panel, DOES NOT test for the novel  Coronavirus (2019 nCoV)    Coronavirus HKU1 NOT DETECTED NOT DETECTED Final   Coronavirus NL63 NOT DETECTED NOT DETECTED Final   Coronavirus OC43 NOT DETECTED NOT DETECTED Final   Metapneumovirus NOT DETECTED NOT DETECTED Final   Rhinovirus / Enterovirus NOT DETECTED NOT DETECTED Final   Influenza A NOT DETECTED NOT DETECTED Final   Influenza B NOT DETECTED NOT DETECTED Final   Parainfluenza Virus 1 NOT DETECTED NOT DETECTED Final   Parainfluenza Virus 2 NOT DETECTED NOT DETECTED Final   Parainfluenza Virus 3 NOT DETECTED NOT DETECTED Final   Parainfluenza Virus 4 NOT DETECTED NOT DETECTED Final   Respiratory Syncytial Virus NOT DETECTED NOT DETECTED Final   Bordetella pertussis NOT DETECTED NOT DETECTED Final   Bordetella Parapertussis NOT DETECTED NOT DETECTED Final   Chlamydophila pneumoniae NOT DETECTED NOT DETECTED Final   Mycoplasma pneumoniae NOT DETECTED NOT DETECTED Final    Comment: Performed at [facility], [address], [place], [place] [address]  Culture, respiratory (non-expectorated)     Status: None   Collection Time: 10/05/20 10:15 AM   Specimen: Tracheal Aspirate; Respiratory  Result Value Ref Range Status   Specimen Description TRACHEAL ASPIRATE  Final   Special Requests NONE  Final   Gram Stain   Final    ABUNDANT WBC PRESENT,BOTH PMN AND MONONUCLEAR FEW GRAM NEGATIVE RODS Performed at [facility], [address] [place], [place] [address]    Culture ABUNDANT PSEUDOMONAS AERUGINOSA  Final   Report Status 10/07/2020 FINAL  Final   Organism ID, Bacteria PSEUDOMONAS AERUGINOSA  Final      Susceptibility   Pseudomonas aeruginosa - MIC*    CEFTAZIDIME 4 SENSITIVE Sensitive     CIPROFLOXACIN 0.5 SENSITIVE Sensitive     GENTAMICIN 2 SENSITIVE Sensitive     IMIPENEM 2 SENSITIVE Sensitive     PIP/TAZO 8 SENSITIVE Sensitive     CEFEPIME 2 SENSITIVE Sensitive     * ABUNDANT PSEUDOMONAS AERUGINOSA         Radiology Studies: No results found.    Scheduled Meds: . apixaban  5 mg Oral BID  . ARIPiprazole  5 mg Oral Daily  . busPIRone  15 mg Oral BID  . carvedilol  25 mg Oral BID WC  . chlorhexidine  15 mL Mouth Rinse BID  . Chlorhexidine Gluconate Cloth  6 each Topical Daily  . feeding supplement (GLUCERNA SHAKE)  237 mL Oral TID BM  . FLUoxetine  40 mg Oral Daily  . fluticasone  furoate-vilanterol  1 puff Inhalation Daily  . furosemide  40 mg Oral Daily  . gabapentin  300 mg Oral BID  . insulin aspart  0-15 Units Subcutaneous TID WC  . insulin aspart  0-5 Units Subcutaneous QHS  . isosorbide mononitrate  30 mg Oral Daily  . lidocaine  1 patch Transdermal Q24H  . mouth rinse  15 mL Mouth Rinse q12n4p  . multivitamin with minerals  1 tablet Oral Daily  . pantoprazole  40 mg Oral Q1200  . predniSONE  40 mg Oral Q breakfast  . rosuvastatin  20 mg Oral Daily  . umeclidinium bromide  1 puff Inhalation Daily   Continuous Infusions: . sodium chloride    .  cefTAZidime (FORTAZ)  IV 2 g (10/12/20 [id number])     LOS: 8 days      Calvert CantorSaima Obie Silos, MD [facility] Pager: [email or web address] 10/12/2020, 11:19 AM

## 2020-10-13 DIAGNOSIS — Z978 Presence of other specified devices: Secondary | ICD-10-CM

## 2020-10-13 DIAGNOSIS — I10 Essential (primary) hypertension: Secondary | ICD-10-CM

## 2020-10-13 DIAGNOSIS — Z9289 Personal history of other medical treatment: Secondary | ICD-10-CM

## 2020-10-13 DIAGNOSIS — R092 Respiratory arrest: Secondary | ICD-10-CM

## 2020-10-13 LAB — GLUCOSE, CAPILLARY
Glucose-Capillary: 128 mg/dL — ABNORMAL HIGH (ref 70–99)
Glucose-Capillary: 164 mg/dL — ABNORMAL HIGH (ref 70–99)

## 2020-10-13 MED ORDER — ALBUTEROL SULFATE (2.5 MG/3ML) 0.083% IN NEBU: INHALATION_SOLUTION | RESPIRATORY_TRACT | 1 refills | Status: DC

## 2020-10-13 MED ORDER — PREDNISONE 10 MG PO TABS: 10.0000 mg | ORAL_TABLET | Freq: Every day | ORAL | 1 refills | Status: DC

## 2020-10-13 MED ORDER — DALIRESP 250 MCG PO TABS: 250.0000 ug | ORAL_TABLET | Freq: Every day | ORAL | 1 refills | Status: DC

## 2020-10-13 MED ORDER — GLIMEPIRIDE 2 MG PO TABS
2.0000 mg | ORAL_TABLET | Freq: Every day | ORAL | Status: DC
  Administered 2020-10-13: 2 mg via ORAL
  Filled 2020-10-13: qty 1

## 2020-10-13 NOTE — TOC Transition Note (Signed)
Transition of Care [facility]) - CM/SW Discharge Note   Patient Details  Name: Kenneth [name] MRN: [id number] Date of Birth: Jan 02, 1961  Transition [facility] CM/SW Contact:  Kallie Locks, RN Phone Number: [phone] 10/13/2020, 1:53 PM   Clinical Narrative:  Sherron Monday with Mr. Ritson about home health PT recommendations and DME. Mr. Tagliaferro reports that he already has 3 in 1. He will need rolling walker. Home health PT arranged thru [facility] and rolling walker to be delivered to room thru [employer].   Mr. Jakubiak states he lives with his fiance. His brother will come to pick him up today from hospital.  No further needs assessed.     Final next level of care: Home w Home Health Services Barriers to Discharge: No Barriers Identified   Patient Goals and CMS Choice Patient states their goals for this hospitalization and ongoing recovery are:: get stronger CMS Medicare.gov Compare Post Acute Care list provided to:: Patient Choice offered to / list presented to : Patient  Discharge Placement                       Discharge Plan and Services                DME Arranged: Walker rolling DME Agency: [facility] Date DME Agency Contacted: 10/13/20 Time DME Agency Contacted: 1215 Representative spoke with at DME Agency: Lawernce Keas HH Arranged: PT HH Agency: [facility] Date HH Agency Contacted: 10/13/20 Time HH Agency Contacted: 1215 Representative spoke with at [facility] Agency: Claris Che  Social Determinants of Health (SDOH) Interventions     Readmission Risk Interventions No flowsheet data found.    Raiford Noble, MSN, RN,BSN Inpatient [facility] Case Manager [phone]

## 2020-10-13 NOTE — Progress Notes (Signed)
D/C instructions given and reviewed. No questions asked but encouraged to call with any concerns. Tele and IV removed, tolerated well. Pt stated his family would bring O2 tank when they came to pick him up.

## 2020-10-13 NOTE — Plan of Care (Signed)
  Problem: Education: Goal: Knowledge of General Education information will improve Description: Including pain rating scale, medication(s)/side effects and non-pharmacologic comfort measures Outcome: Adequate for Discharge   Problem: Health Behavior/Discharge Planning: Goal: Ability to manage health-related needs will improve Outcome: Adequate for Discharge   Problem: Clinical Measurements: Goal: Ability to maintain clinical measurements within normal limits will improve Outcome: Adequate for Discharge Goal: Will remain free from infection Outcome: Adequate for Discharge Goal: Diagnostic test results will improve Outcome: Adequate for Discharge Goal: Respiratory complications will improve Outcome: Adequate for Discharge Goal: Cardiovascular complication will be avoided Outcome: Adequate for Discharge   Problem: Activity: Goal: Risk for activity intolerance will decrease Outcome: Adequate for Discharge   Problem: Nutrition: Goal: Adequate nutrition will be maintained Outcome: Adequate for Discharge   Problem: Coping: Goal: Level of anxiety will decrease Outcome: Adequate for Discharge   Problem: Elimination: Goal: Will not experience complications related to bowel motility Outcome: Adequate for Discharge Goal: Will not experience complications related to urinary retention Outcome: Adequate for Discharge   Problem: Pain Managment: Goal: General experience of comfort will improve Outcome: Adequate for Discharge   Problem: Safety: Goal: Ability to remain free from injury will improve Outcome: Adequate for Discharge   Problem: Skin Integrity: Goal: Risk for impaired skin integrity will decrease Outcome: Adequate for Discharge   Problem: Education: Goal: Knowledge of disease or condition will improve Outcome: Adequate for Discharge Goal: Knowledge of the prescribed therapeutic regimen will improve Outcome: Adequate for Discharge   Problem: Activity: Goal: Ability  to tolerate increased activity will improve Outcome: Adequate for Discharge Goal: Will verbalize the importance of balancing activity with adequate rest periods Outcome: Adequate for Discharge   Problem: Respiratory: Goal: Ability to maintain a clear airway will improve Outcome: Adequate for Discharge Goal: Levels of oxygenation will improve Outcome: Adequate for Discharge Goal: Ability to maintain adequate ventilation will improve Outcome: Adequate for Discharge   

## 2020-10-13 NOTE — Discharge Summary (Signed)
Physician Discharge Summary  Kenneth [name] FYB:[id number] DOB: 1961/01/26 DOA: 10/03/2020  PCP: [name], MD  Admit date: 10/03/2020 Discharge date: 10/13/2020  Admitted From: home Disposition:  home   Recommendations for Outpatient Follow-up:  1. Needs close f/u with Pulmonary   Home Health:  ordered  Discharge Condition:  stable   CODE STATUS:  Full code   Diet recommendation:  Heart healthy Consultations:  PCCM  Procedures/Studies: . Intubation/ extubation   Discharge Diagnoses:  Principal Problem:   Cardiac arrest   Active Problems:   COPD with acute exacerbation    Chronic combined systolic and diastolic heart failure     Essential hypertension   Chronic obstructive pulmonary disease (HCC)   Controlled type 2 diabetes mellitus without complication, without long-term current use of     insulin     PAF (paroxysmal atrial fibrillation) ([place])   Obstructive sleep apnea     Brief Summary: Kenneth [name] is a 60 y/o male with COPD on 2 L O2 at home, OSA on CPAP, DM, HTN, PAF, non ischemic cardiomyopathy with EF of 30-35%, who presented to the ED with an out of hospital PEA arrest. ROSC was achieved after 5 min of CPR and 1 round of Epi. His wife stated that he was short of breath for a number of days prior to the above arrest and that he went to the fire dept for his symptoms where he collapsed and arrested.   In ED > intubated, Lactic acid 3.8, Troponin 167 COVID, Influenza and resp panel negative HIV neg  He was extubated on 1/14 and transferred out of the ICU to [facility] service on 1/20.   He was admitted from 09/23/20 -09/24/20 for a COPD exacerbation.  He was admitted from 52/77/82-42/35/36 for a diastolic CHF exacerbation.   Hospital Course:  Principal Problem:   Cardiac arrest- prior h/o non ischemic cardiomyopathy with reduced EF - PEA arrest (suspected to be secondary to COPD exacerbation) s/p 5 min of CPR and Epi to regain ROSC - 2 d ECHO  during this admission revealed normal EF, mild LVH and indeterminate diastolic parameters. No focal WMA.  - cont BID Lasix as outpt  Active Problems: Lactic acidosis and elevated troponin - due to cardiac arrest    Chronic obstructive pulmonary disease with exacerbation B/l Pneumonia with pseudomonas  Acute on chronic respiratory failure - has an FEV 1 of 25% and is on 2 L O2 chronically and has had many admissions for COPD exacerbations - extubated on 1/14  - multiple CXR suggestive of bilateral infiltrates - sputum culture 1/16> found to have "abundandant" pan sensitive pseudomonas on sputum culture- initially was received rocephin and azithromycin - transitioned to Cefepime on 1/16 and then to [place] on 1/17- he has completed a 7 day course which is to stop today as recommended by PCCM - Will dc home with Daliresp 250 mcg daily per pulmonary recommendations - transitioned to Prednisone on 1/21 - I will prescribe a slow taper followed by 10 mg daily which may help prevent a recurrence-     ICU delirium - requiring Precedex - resolved    Controlled type 2 diabetes mellitus without complication, without long-term current use of insulin - cont SSI     PAF (paroxysmal atrial fibrillation) - cont Coreg & Eliquis    Anxiety - he takes Buspar, Prozac and Trazodone as outpt  GERD and Constipation - cont Pepcid, Protonix and [name]   Discharge Exam: Vitals:   10/13/20 0909 10/13/20  1147  BP: (!) 176/94 (!) 164/70  Pulse: 64 60  Resp: 18 19  Temp: 98.2 F (36.8 C) 97.6 F (36.4 C)  SpO2: 100% 98%   Vitals:   10/13/20 0422 10/13/20 0500 10/13/20 0909 10/13/20 1147  BP: (!) 178/79  (!) 176/94 (!) 164/70  Pulse: 67  64 60  Resp: 20  18 19   Temp: 98.2 F (36.8 C)  98.2 F (36.8 C) 97.6 F (36.4 C)  TempSrc: Oral  Oral Oral  SpO2: 100%  100% 98%  Weight:  87.7 kg    Height:        General: Pt is alert, awake, not in acute distress Cardiovascular: RRR, S1/S2 +,  no rubs, no gallops Respiratory: CTA bilaterally, no wheezing, no rhonchi Abdominal: Soft, NT, ND, bowel sounds + Extremities: no edema, no cyanosis   Discharge Instructions  Discharge Instructions    Diet - low sodium heart healthy   Complete by: As directed    Increase activity slowly   Complete by: As directed    No wound care   Complete by: As directed      Allergies as of 10/13/2020      Reactions   Lisinopril Swelling   Other Other (See Comments)   Lettuce : rash   Tomato Rash      Medication List    TAKE these medications   acetaminophen 325 MG tablet Commonly known as: TYLENOL Take 650 mg by mouth every 6 (six) hours as needed for mild pain or headache.   albuterol (2.5 MG/3ML) 0.083% nebulizer solution Commonly known as: PROVENTIL INHALE 1 VIAL VIA NEBULIZER 5 TIMES DAILY AS NEEDED FOR WHEEZING OR FOR SHORTNESS OF BREATH What changed: See the new instructions.   apixaban 5 MG Tabs tablet Commonly known as: Eliquis Take 1 tablet (5 mg total) by mouth 2 (two) times daily.   ARIPiprazole 5 MG tablet Commonly known as: ABILIFY Take 5 mg by mouth daily.   busPIRone 15 MG tablet Commonly known as: BUSPAR Take 1 tablet (15 mg total) by mouth 2 (two) times daily.   carvedilol 12.5 MG tablet Commonly known as: COREG Take 1 tablet (12.5 mg total) by mouth 2 (two) times daily with a meal.   Coricidin HBP Day/Night Cold 10-20 &15-200-2 MG Misc Generic drug: DM-GG & DM-APAP-CPM Take 1 tablet by mouth every 12 (twelve) hours as needed (cold symptoms).   Daliresp 250 MCG Tabs Generic drug: Roflumilast Take 250 mcg by mouth daily.   famotidine 20 MG tablet Commonly known as: PEPCID Take 20 mg by mouth daily.   FLUoxetine 20 MG capsule Commonly known as: PROZAC TAKE 3 CAPSULES BY MOUTH  DAILY   furosemide 40 MG tablet Commonly known as: LASIX Take 1 tablet (40 mg total) by mouth 2 (two) times daily.   gabapentin 300 MG capsule Commonly known as:  NEURONTIN TAKE ONE CAPSULE BY MOUTH THREE TIMES DAILY   guaiFENesin 600 MG 12 hr tablet Commonly known as: MUCINEX Take 1 tablet (600 mg total) by mouth 2 (two) times daily.   ipratropium-albuterol 0.5-2.5 (3) MG/3ML Soln Commonly known as: DUONEB Take 3 mLs by nebulization every 6 (six) hours as needed.   isosorbide mononitrate 30 MG 24 hr tablet Commonly known as: IMDUR Take 30 mg by mouth daily.   lubiprostone 8 MCG capsule Commonly known as: [name] TAKE 1 CAPSULE(8 MCG) BY MOUTH TWICE DAILY WITH A MEAL What changed:   how much to take  how to take this  when to take this  additional instructions   multivitamin with minerals Tabs tablet Take 1 tablet by mouth daily.   nitroGLYCERIN 0.4 MG SL tablet Commonly known as: NITROSTAT DISSOLVE 1 TABLET UNDER THE TONGUE EVERY 5 MINUTES AS  NEEDED FOR CHEST PAIN. MAX  OF 3 TABLETS IN 15 MINUTES. CALL 911 IF PAIN PERSISTS. What changed:   how much to take  how to take this  when to take this  reasons to take this  additional instructions   ONE TOUCH ULTRA 2 w/Device Kit Use to test blood sugars 1-2 times daily.   OneTouch Ultra test strip Generic drug: glucose blood USE TO TEST BLOOD SUGAR 1-2 TIMES DAILY   onetouch ultrasoft lancets USE TO TEST BLOOD SUGAR 1-2 TIMES DAILY   pantoprazole 40 MG tablet Commonly known as: PROTONIX TAKE ONE TABLET BY MOUTH EVERY MORNING What changed: when to take this   predniSONE 10 MG tablet Commonly known as: DELTASONE Take 1 tablet (10 mg total) by mouth daily. Take 40 mg x 1 day, 30 mg x 3 days, 20 mg x 3 days and then 10 mg daily- do not stop until your doctor tells you to.   rosuvastatin 20 MG tablet Commonly known as: Crestor Take 1 tablet (20 mg total) by mouth daily.   traZODone 50 MG tablet Commonly known as: DESYREL TAKE 1/2 TO 1 TABLET BY  MOUTH AT BEDTIME AS NEEDED  FOR SLEEP. What changed:   reasons to take this  additional instructions   Trelegy  Ellipta 100-62.5-25 MCG/INH Aepb Generic drug: Fluticasone-Umeclidin-Vilant Inhale 1 puff into the lungs daily.       Follow-up Information    Parrett, Fonnie Mu, NP Follow up on 10/25/2020.   Specialty: Pulmonary Disease Why: Appt at 9:30 AM. Please arrive at 9:15 for check in. Contact information: [address] [place] [address] [phone]              Allergies  Allergen Reactions  . Lisinopril Swelling  . Other Other (See Comments)    Lettuce : rash  . Tomato Rash      DG Chest 1 View  Result Date: 10/09/2020 CLINICAL DATA:  Extubation. EXAM: CHEST  1 VIEW COMPARISON:  10/06/2020. FINDINGS: Mediastinum and hilar structures normal. Stable cardiomegaly. Low lung volumes with bibasilar atelectasis/infiltrates, improved from prior exam. Small left pleural effusion. Right costophrenic angle incompletely imaged. No pneumothorax. IMPRESSION: 1. Low lung volumes with bibasilar atelectasis/infiltrates, improved from prior exam. Small left pleural effusion. 2.  Stable cardiomegaly. Electronically Signed   By: Marcello Moores  Register   On: 10/09/2020 06:37   DG Abdomen 1 View  Result Date: 10/03/2020 CLINICAL DATA:  60 year old male status post intubation. EXAM: ABDOMEN - 1 VIEW; PORTABLE CHEST - 1 VIEW COMPARISON:  Chest CT dated 09/23/2020. FINDINGS: Endotracheal tube with tip approximately 6 cm above the carina. Enteric tube extends below the diaphragm with tip and side-port in the proximal stomach. Faint bilateral perihilar streaky densities, likely atelectasis. No focal consolidation, pleural effusion or pneumothorax. The cardiac silhouette is within limits. No acute osseous pathology. IMPRESSION: 1. Endotracheal tube above the carina. 2. Enteric tube with tip and side-port in the proximal stomach. Electronically Signed   By: Anner Crete M.D.   On: 10/03/2020 20:57   CT Head Wo Contrast  Result Date: 10/03/2020 CLINICAL DATA:  Altered mental status status post  cardiac arrest EXAM: CT HEAD WITHOUT CONTRAST TECHNIQUE: Contiguous axial images were obtained from the base of  the skull through the vertex without intravenous contrast. COMPARISON:  Head CT September 13, 2020. FINDINGS: Brain: No evidence of acute infarction, hemorrhage, hydrocephalus, extra-axial collection or mass lesion/mass effect. Vascular: No hyperdense vessel or unexpected calcification. Skull: Normal. Negative for fracture or focal lesion. Sinuses/Orbits: No acute finding. Other: None. IMPRESSION: No acute intracranial abnormality. Electronically Signed   By: Dahlia Bailiff MD   On: 10/03/2020 21:34   CT Chest Wo Contrast  Result Date: 09/23/2020 CLINICAL DATA:  COPD exacerbation, shortness of breath EXAM: CT CHEST WITHOUT CONTRAST TECHNIQUE: Multidetector CT imaging of the chest was performed following the standard protocol without IV contrast. COMPARISON:  03/03/2020 FINDINGS: Cardiovascular: Aortic atherosclerosis. Heart is normal size. Aorta is normal caliber. Mediastinum/Nodes: No mediastinal, hilar, or axillary adenopathy. Trachea and esophagus are unremarkable. Thyroid unremarkable. Lungs/Pleura: Centrilobular and paraseptal emphysema. Peripheral platelike scarring or atelectasis in the left lower lobe and lingula. Linear scarring at the right base in both the right middle lobe and right lower lobe. No effusions. No confluent airspace opacities. Upper Abdomen: Imaging into the upper abdomen demonstrates no acute findings. Musculoskeletal: Chest wall soft tissues are unremarkable. No acute bony abnormality. IMPRESSION: Moderate centrilobular and paraseptal emphysema. Linear bibasilar scarring or atelectasis.  No confluent opacities. Aortic Atherosclerosis (ICD10-I70.0) and Emphysema (ICD10-J43.9). Electronically Signed   By: Rolm Baptise M.D.   On: 09/23/2020 11:57   CT Angio Chest PE W and/or Wo Contrast  Result Date: 10/03/2020 CLINICAL DATA:  Chest pain, shortness of breath, post arrest  EXAM: CT ANGIOGRAPHY CHEST WITH CONTRAST TECHNIQUE: Multidetector CT imaging of the chest was performed using the standard protocol during bolus administration of intravenous contrast. Multiplanar CT image reconstructions and MIPs were obtained to evaluate the vascular anatomy. CONTRAST:  6m OMNIPAQUE IOHEXOL 350 MG/ML SOLN COMPARISON:  09/23/2020 FINDINGS: Cardiovascular: No filling defects in the pulmonary arteries to suggest pulmonary emboli. Heart is normal size. Aorta is normal caliber. Mediastinum/Nodes: No mediastinal, hilar, or axillary adenopathy. Endotracheal tube tip in the distal trachea. Thyroid and esophagus unremarkable. Lungs/Pleura: Moderate emphysema. No confluent opacities or effusions. Upper Abdomen: Imaging into the upper abdomen demonstrates no acute findings. NG tube in the stomach. Reflux of contrast into the IVC and hepatic veins suggesting right heart dysfunction. Musculoskeletal: Chest wall soft tissues are unremarkable. No acute bony abnormality. Review of the MIP images confirms the above findings. IMPRESSION: No evidence of pulmonary embolus. Reflux of contrast into the IVC and hepatic veins compatible with right heart dysfunction. Endotracheal tube and NG tube in expected position. Aortic Atherosclerosis (ICD10-I70.0) and Emphysema (ICD10-J43.9). Electronically Signed   By: KRolm BaptiseM.D.   On: 10/03/2020 21:30   DG CHEST PORT 1 VIEW  Result Date: 10/10/2020 CLINICAL DATA:  Shortness of breath.  Pleural effusion. EXAM: PORTABLE CHEST 1 VIEW COMPARISON:  10/09/2020. FINDINGS: Mediastinum and hilar structures normal. Cardiomegaly. Mild pulmonary venous congestion. Low lung volumes persistent bibasilar atelectasis/infiltrates. No pleural effusion or pneumothorax. Left costophrenic incompletely imaged. Thoracic spine scoliosis. IMPRESSION: 1. Cardiomegaly with mild pulmonary venous congestion. 2. Low lung volumes with persistent bibasilar atelectasis/infiltrates. Electronically  Signed   By: TMarcello Moores Register   On: 10/10/2020 07:59   DG CHEST PORT 1 VIEW  Result Date: 10/06/2020 CLINICAL DATA:  Respiratory arrest. EXAM: PORTABLE CHEST 1 VIEW COMPARISON:  10/05/2020 FINDINGS: Lungs are adequately inflated with persistent hazy patchy density over the mid to lower lungs likely due to infection and less likely asymmetric edema or atelectasis. Cardiomediastinal silhouette and remainder of the exam is unchanged. IMPRESSION:  Persistent hazy patchy density over the mid to lower lungs likely infection and less likely asymmetric edema or atelectasis. Electronically Signed   By: Marin Olp M.D.   On: 10/06/2020 09:21   DG Chest Port 1 View  Result Date: 10/05/2020 CLINICAL DATA:  Hypoxia EXAM: PORTABLE CHEST 1 VIEW COMPARISON:  October 03, 2020 chest radiograph and chest CT FINDINGS: Endotracheal tube tip is 5.2 cm above the carina. Nasogastric tube tip and side port are below the diaphragm. No pneumothorax. There is airspace opacity in the right base, new from recent studies. There is mild left base atelectasis. Heart is upper normal in size with pulmonary vascularity normal. No adenopathy. There is aortic atherosclerosis. No bone lesions. IMPRESSION: Tube and catheter positions as described without pneumothorax. Airspace opacity right lower lobe, consistent with pneumonia or aspiration. Both of these entities may be present concurrently. There is mild left base atelectasis. Heart upper normal in size. Aortic Atherosclerosis (ICD10-I70.0). Electronically Signed   By: Lowella Grip III M.D.   On: 10/05/2020 08:22   DG Chest Portable 1 View  Result Date: 10/03/2020 CLINICAL DATA:  60 year old male status post intubation. EXAM: ABDOMEN - 1 VIEW; PORTABLE CHEST - 1 VIEW COMPARISON:  Chest CT dated 09/23/2020. FINDINGS: Endotracheal tube with tip approximately 6 cm above the carina. Enteric tube extends below the diaphragm with tip and side-port in the proximal stomach. Faint bilateral  perihilar streaky densities, likely atelectasis. No focal consolidation, pleural effusion or pneumothorax. The cardiac silhouette is within limits. No acute osseous pathology. IMPRESSION: 1. Endotracheal tube above the carina. 2. Enteric tube with tip and side-port in the proximal stomach. Electronically Signed   By: Anner Crete M.D.   On: 10/03/2020 20:57   DG Chest Port 1 View  Result Date: 09/23/2020 CLINICAL DATA:  Dyspnea EXAM: PORTABLE CHEST 1 VIEW COMPARISON:  09/14/2020 chest radiograph. FINDINGS: Stable cardiomediastinal silhouette with normal heart size. No pneumothorax. No pleural effusion. No pulmonary edema. Faint hazy bibasilar lung opacities, similar to prior radiograph. IMPRESSION: Faint hazy bibasilar lung opacities, similar to prior radiograph, more suggestive of atelectasis or nonspecific fibrosis. Electronically Signed   By: Ilona Sorrel M.D.   On: 09/23/2020 07:18   DG Chest Port 1 View  Result Date: 09/14/2020 CLINICAL DATA:  COPD exacerbation. EXAM: PORTABLE CHEST 1 VIEW COMPARISON:  09/12/2020 FINDINGS: Cardiopericardial silhouette is at upper limits of normal for size. Subtle bibasilar atelectasis or infiltrate noted with interval improvement in patchy ground-glass opacity seen in the left mid lung. No pulmonary edema or dense focal airspace consolidation. The visualized bony structures of the thorax show no acute abnormality. Telemetry leads overlie the chest. IMPRESSION: Subtle persistent bibasilar atelectasis or infiltrate with improvement in the hazy ground-glass opacity seen previously in the left mid lung. Electronically Signed   By: Misty Stanley M.D.   On: 09/14/2020 09:05   ECHOCARDIOGRAM COMPLETE  Result Date: 10/04/2020    ECHOCARDIOGRAM REPORT   Patient Name:   KODIE KISHI Date of Exam: 10/04/2020 Medical Rec #:  [id number]    Height:       72.0 in Accession #:    [id number]   Weight:       203.7 lb Date of Birth:  1961/08/28   BSA:          2.147 m Patient Age:     60 years     BP:           109/47 mmHg Patient Gender: M  HR:           64 bpm. Exam Location:  Inpatient Procedure: 2D Echo, Color Doppler, Cardiac Doppler and Strain Analysis Indications:    Cardiac arrest ([place]) [427.5.ICD-9-CM]  History:        Patient has prior history of Echocardiogram examinations, most                 recent 09/13/2020. COPD; Risk Factors:Diabetes, Hypertension,                 Sleep Apnea, Dyslipidemia and Current Smoker. Hronic kidney                 disease. GERD. History of Afib.  Sonographer:    Darlina Sicilian RDCS Referring Phys: [place]  1. Left ventricular ejection fraction, by estimation, is 55 to 60%. The left ventricle has normal function. The left ventricle has no regional wall motion abnormalities. There is mild left ventricular hypertrophy. Left ventricular diastolic parameters are indeterminate.  2. Right ventricular systolic function is normal. The right ventricular size is normal. Tricuspid regurgitation signal is inadequate for assessing PA pressure.  3. A small pericardial effusion is present.  4. The mitral valve is normal in structure. No evidence of mitral valve regurgitation.  5. The aortic valve was not well visualized. Aortic valve regurgitation is mild. Mild to moderate aortic valve stenosis. Vmax 2.8 m/s, MG 32mHg, AVA 1.6 cm^2, DI 0.4 FINDINGS  Left Ventricle: Left ventricular ejection fraction, by estimation, is 55 to 60%. The left ventricle has normal function. The left ventricle has no regional wall motion abnormalities. The left ventricular internal cavity size was normal in size. There is  mild left ventricular hypertrophy. Left ventricular diastolic parameters are indeterminate. Right Ventricle: The right ventricular size is normal. No increase in right ventricular wall thickness. Right ventricular systolic function is normal. Tricuspid regurgitation signal is inadequate for assessing PA pressure. Left Atrium: Left  atrial size was normal in size. Right Atrium: Right atrial size was normal in size. Pericardium: A small pericardial effusion is present. Mitral Valve: The mitral valve is normal in structure. No evidence of mitral valve regurgitation. Tricuspid Valve: The tricuspid valve is normal in structure. Tricuspid valve regurgitation is trivial. Aortic Valve: The aortic valve was not well visualized. Aortic valve regurgitation is mild. Aortic regurgitation PHT measures 521 msec. Mild to moderate aortic stenosis is present. Aortic valve mean gradient measures 15.4 mmHg. Aortic valve peak gradient  measures 27.7 mmHg. Aortic valve area, by VTI measures 1.83 cm. Pulmonic Valve: The pulmonic valve was not well visualized. Pulmonic valve regurgitation is not visualized. Aorta: The aortic root is normal in size and structure. IAS/Shunts: The interatrial septum was not well visualized.  LEFT VENTRICLE PLAX 2D LVIDd:         5.50 cm  Diastology LVIDs:         4.50 cm  LV e' medial:    5.98 cm/s LV PW:         1.00 cm  LV E/e' medial:  11.4 LV IVS:        1.10 cm  LV e' lateral:   4.79 cm/s LVOT diam:     2.30 cm  LV E/e' lateral: 14.2 LV SV:         85 LV SV Index:   40 LVOT Area:     4.15 cm  RIGHT VENTRICLE RV S prime:     15.40 cm/s TAPSE (M-mode): 2.4 cm LEFT ATRIUM  Index       RIGHT ATRIUM          Index LA diam:        3.40 cm 1.58 cm/m  RA Area:     9.68 cm LA Vol (A2C):   68.2 ml 31.76 ml/m RA Volume:   16.90 ml 7.87 ml/m LA Vol (A4C):   72.7 ml 33.86 ml/m LA Biplane Vol: 71.4 ml 33.25 ml/m  AORTIC VALVE AV Area (Vmax):    1.97 cm AV Area (Vmean):   1.76 cm AV Area (VTI):     1.83 cm AV Vmax:           263.20 cm/s AV Vmean:          186.000 cm/s AV VTI:            0.465 m AV Peak Grad:      27.7 mmHg AV Mean Grad:      15.4 mmHg LVOT Vmax:         125.00 cm/s LVOT Vmean:        79.000 cm/s LVOT VTI:          0.205 m LVOT/AV VTI ratio: 0.44 AI PHT:            521 msec  AORTA Ao Root diam: 3.40 cm MITRAL  VALVE MV Area (PHT): 3.77 cm    SHUNTS MV Decel Time: 201 msec    Systemic VTI:  0.20 m MV E velocity: 68.10 cm/s  Systemic Diam: 2.30 cm MV A velocity: 69.80 cm/s MV E/A ratio:  0.98 Oswaldo Milian MD Electronically signed by Oswaldo Milian MD Signature Date/Time: 10/04/2020/5:46:31 PM    Final    [place] EKG SITE RITE  Result Date: 10/04/2020 If Site Rite image not attached, placement could not be confirmed due to current cardiac rhythm.    The results of significant diagnostics from this hospitalization (including imaging, microbiology, ancillary and laboratory) are listed below for reference.     Microbiology: Recent Results (from the past 240 hour(s))  Resp Panel by RT-PCR (Flu A&B, Covid) Nasopharyngeal Swab     Status: None   Collection Time: 10/03/20  8:20 PM   Specimen: Nasopharyngeal Swab; Nasopharyngeal(NP) swabs in vial transport medium  Result Value Ref Range Status   SARS Coronavirus 2 by RT PCR NEGATIVE NEGATIVE Final    Comment: (NOTE) SARS-CoV-2 target nucleic acids are NOT DETECTED.  The SARS-CoV-2 RNA is generally detectable in upper respiratory specimens during the acute phase of infection. The lowest concentration of SARS-CoV-2 viral copies this assay can detect is 138 copies/mL. A negative result does not preclude SARS-Cov-2 infection and should not be used as the sole basis for treatment or other patient management decisions. A negative result may occur with  improper specimen collection/handling, submission of specimen other than nasopharyngeal swab, presence of viral mutation(s) within the areas targeted by this assay, and inadequate number of viral copies(<138 copies/mL). A negative result must be combined with clinical observations, patient history, and epidemiological information. The expected result is Negative.  Fact Sheet for Patients:  EntrepreneurPulse.com.au  Fact Sheet for Healthcare Providers:   IncredibleEmployment.be  This test is no t yet approved or cleared by the [place] FDA and  has been authorized for detection and/or diagnosis of SARS-CoV-2 by FDA under an Emergency Use Authorization (EUA). This EUA will remain  in effect (meaning this test can be used) for the duration of the COVID-19 declaration under Section 564(b)(1) of the Act, 21 U.S.C.section 360bbb-3(b)(1),  unless the authorization is terminated  or revoked sooner.       Influenza A by PCR NEGATIVE NEGATIVE Final   Influenza B by PCR NEGATIVE NEGATIVE Final    Comment: (NOTE) The Xpert Xpress SARS-CoV-2/FLU/RSV plus assay is intended as an aid in the diagnosis of influenza from Nasopharyngeal swab specimens and should not be used as a sole basis for treatment. Nasal washings and aspirates are unacceptable for Xpert Xpress SARS-CoV-2/FLU/RSV testing.  Fact Sheet for Patients: EntrepreneurPulse.com.au  Fact Sheet for Healthcare Providers: IncredibleEmployment.be  This test is not yet approved or cleared by the [place] FDA and has been authorized for detection and/or diagnosis of SARS-CoV-2 by FDA under an Emergency Use Authorization (EUA). This EUA will remain in effect (meaning this test can be used) for the duration of the COVID-19 declaration under Section 564(b)(1) of the Act, 21 U.S.C. section 360bbb-3(b)(1), unless the authorization is terminated or revoked.  Performed at [facility], [place] [address], [place], [place] [address]   MRSA PCR Screening     Status: None   Collection Time: 10/04/20  1:45 AM   Specimen: Nasal Mucosa; Nasopharyngeal  Result Value Ref Range Status   MRSA by PCR NEGATIVE NEGATIVE Final    Comment:        The GeneXpert MRSA Assay (FDA approved for NASAL specimens only), is one component of a comprehensive MRSA colonization surveillance program. It is not intended to diagnose MRSA infection nor  to guide or monitor treatment for MRSA infections. Performed at [facility], [place] [address] [place], [place] [address]   Respiratory (~20 pathogens) panel by PCR     Status: None   Collection Time: 10/05/20  3:20 AM   Specimen: Tracheal Aspirate; Respiratory  Result Value Ref Range Status   Adenovirus NOT DETECTED NOT DETECTED Final   Coronavirus 229E NOT DETECTED NOT DETECTED Final    Comment: (NOTE) The Coronavirus on the Respiratory Panel, DOES NOT test for the novel  Coronavirus (2019 nCoV)    Coronavirus HKU1 NOT DETECTED NOT DETECTED Final   Coronavirus NL63 NOT DETECTED NOT DETECTED Final   Coronavirus OC43 NOT DETECTED NOT DETECTED Final   Metapneumovirus NOT DETECTED NOT DETECTED Final   Rhinovirus / Enterovirus NOT DETECTED NOT DETECTED Final   Influenza A NOT DETECTED NOT DETECTED Final   Influenza B NOT DETECTED NOT DETECTED Final   Parainfluenza Virus 1 NOT DETECTED NOT DETECTED Final   Parainfluenza Virus 2 NOT DETECTED NOT DETECTED Final   Parainfluenza Virus 3 NOT DETECTED NOT DETECTED Final   Parainfluenza Virus 4 NOT DETECTED NOT DETECTED Final   Respiratory Syncytial Virus NOT DETECTED NOT DETECTED Final   Bordetella pertussis NOT DETECTED NOT DETECTED Final   Bordetella Parapertussis NOT DETECTED NOT DETECTED Final   Chlamydophila pneumoniae NOT DETECTED NOT DETECTED Final   Mycoplasma pneumoniae NOT DETECTED NOT DETECTED Final    Comment: Performed at [facility], [place]. [address], [place], [place] [address]  Culture, respiratory (non-expectorated)     Status: None   Collection Time: 10/05/20 10:15 AM   Specimen: Tracheal Aspirate; Respiratory  Result Value Ref Range Status   Specimen Description TRACHEAL ASPIRATE  Final   Special Requests NONE  Final   Gram Stain   Final    ABUNDANT WBC PRESENT,BOTH PMN AND MONONUCLEAR FEW GRAM NEGATIVE RODS Performed at [facility], [place] [address], [place], [place] [address]    Culture ABUNDANT  PSEUDOMONAS AERUGINOSA  Final  Report Status 10/07/2020 FINAL  Final   Organism ID, Bacteria PSEUDOMONAS AERUGINOSA  Final      Susceptibility   Pseudomonas aeruginosa - MIC*    CEFTAZIDIME 4 SENSITIVE Sensitive     CIPROFLOXACIN 0.5 SENSITIVE Sensitive     GENTAMICIN 2 SENSITIVE Sensitive     IMIPENEM 2 SENSITIVE Sensitive     PIP/TAZO 8 SENSITIVE Sensitive     CEFEPIME 2 SENSITIVE Sensitive     * ABUNDANT PSEUDOMONAS AERUGINOSA     Labs: BNP (last 3 results) Recent Labs    09/16/20 0154 09/23/20 0634 10/03/20 2037  BNP 1,514.3* 1,083.5* 295.1*   Basic Metabolic Panel: Recent Labs  Lab 10/07/20 0109 10/08/20 0104 10/09/20 0127 10/10/20 0217 10/11/20 0426  NA 139 138 140 140 143  K 3.9 4.6 4.3 4.3 4.0  CL 97* 97* 100 102 102  CO2 29 29 29 28 31   GLUCOSE 166* 243* 130* 137* 113*  BUN 23* 25* 42* 41* 36*  CREATININE 1.20 1.28* 1.72* 1.60* 1.54*  CALCIUM 8.9 9.2 9.5 9.3 9.2  MG 2.4 2.3 2.9* 2.5* 2.2   Liver Function Tests: No results for input(s): AST, ALT, ALKPHOS, BILITOT, PROT, ALBUMIN in the last 168 hours. No results for input(s): LIPASE, AMYLASE in the last 168 hours. No results for input(s): AMMONIA in the last 168 hours. CBC: Recent Labs  Lab 10/07/20 0109 10/08/20 0104 10/09/20 0127 10/10/20 0217 10/11/20 0426  WBC 12.0* 13.8* 13.1* 13.5* 11.2*  HGB 9.9* 10.1* 10.9* 11.1* 10.1*  HCT 31.9* 34.7* 37.0* 37.9* 33.5*  MCV 91.7 93.5 93.2 94.3 93.6  PLT 196 219 223 259 221   Cardiac Enzymes: No results for input(s): CKTOTAL, CKMB, CKMBINDEX, TROPONINI in the last 168 hours. BNP: Invalid input(s): POCBNP CBG: Recent Labs  Lab 10/12/20 1058 10/12/20 1603 10/12/20 2109 10/13/20 0609 10/13/20 1150  GLUCAP 145* 267* 254* 128* 164*   D-Dimer No results for input(s): DDIMER in the last 72 hours. Hgb A1c No results for input(s): HGBA1C in the last 72 hours. Lipid Profile No results for input(s): CHOL, HDL, LDLCALC, TRIG, CHOLHDL, LDLDIRECT in the  last 72 hours. Thyroid function studies No results for input(s): TSH, T4TOTAL, T3FREE, THYROIDAB in the last 72 hours.  Invalid input(s): FREET3 Anemia work up No results for input(s): VITAMINB12, FOLATE, FERRITIN, TIBC, IRON, RETICCTPCT in the last 72 hours. Urinalysis    Component Value Date/Time   COLORURINE AMBER (A) 10/03/2020 2036   APPEARANCEUR CLOUDY (A) 10/03/2020 2036   LABSPEC 1.016 10/03/2020 2036   PHURINE 6.0 10/03/2020 2036   GLUCOSEU >=500 (A) 10/03/2020 2036   GLUCOSEU NEGATIVE 05/20/2019 0825   HGBUR NEGATIVE 10/03/2020 2036   BILIRUBINUR NEGATIVE 10/03/2020 2036   KETONESUR NEGATIVE 10/03/2020 2036   PROTEINUR >=300 (A) 10/03/2020 2036   UROBILINOGEN 1.0 05/20/2019 0825   NITRITE NEGATIVE 10/03/2020 2036   LEUKOCYTESUR NEGATIVE 10/03/2020 2036   Sepsis Labs Invalid input(s): PROCALCITONIN,  WBC,  LACTICIDVEN Microbiology Recent Results (from the past 240 hour(s))  Resp Panel by RT-PCR (Flu A&B, Covid) Nasopharyngeal Swab     Status: None   Collection Time: 10/03/20  8:20 PM   Specimen: Nasopharyngeal Swab; Nasopharyngeal(NP) swabs in vial transport medium  Result Value Ref Range Status   SARS Coronavirus 2 by RT PCR NEGATIVE NEGATIVE Final    Comment: (NOTE) SARS-CoV-2 target nucleic acids are NOT DETECTED.  The SARS-CoV-2 RNA is generally detectable in upper respiratory specimens during the acute phase of infection. The lowest concentration of SARS-CoV-2 viral copies  this assay can detect is 138 copies/mL. A negative result does not preclude SARS-Cov-2 infection and should not be used as the sole basis for treatment or other patient management decisions. A negative result may occur with  improper specimen collection/handling, submission of specimen other than nasopharyngeal swab, presence of viral mutation(s) within the areas targeted by this assay, and inadequate number of viral copies(<138 copies/mL). A negative result must be combined with clinical  observations, patient history, and epidemiological information. The expected result is Negative.  Fact Sheet for Patients:  EntrepreneurPulse.com.au  Fact Sheet for Healthcare Providers:  IncredibleEmployment.be  This test is no t yet approved or cleared by the [place] FDA and  has been authorized for detection and/or diagnosis of SARS-CoV-2 by FDA under an Emergency Use Authorization (EUA). This EUA will remain  in effect (meaning this test can be used) for the duration of the COVID-19 declaration under Section 564(b)(1) of the Act, 21 U.S.C.section 360bbb-3(b)(1), unless the authorization is terminated  or revoked sooner.       Influenza A by PCR NEGATIVE NEGATIVE Final   Influenza B by PCR NEGATIVE NEGATIVE Final    Comment: (NOTE) The Xpert Xpress SARS-CoV-2/FLU/RSV plus assay is intended as an aid in the diagnosis of influenza from Nasopharyngeal swab specimens and should not be used as a sole basis for treatment. Nasal washings and aspirates are unacceptable for Xpert Xpress SARS-CoV-2/FLU/RSV testing.  Fact Sheet for Patients: EntrepreneurPulse.com.au  Fact Sheet for Healthcare Providers: IncredibleEmployment.be  This test is not yet approved or cleared by the [place] FDA and has been authorized for detection and/or diagnosis of SARS-CoV-2 by FDA under an Emergency Use Authorization (EUA). This EUA will remain in effect (meaning this test can be used) for the duration of the COVID-19 declaration under Section 564(b)(1) of the Act, 21 U.S.C. section 360bbb-3(b)(1), unless the authorization is terminated or revoked.  Performed at [facility], [place] [address] [place], [place] [address]   MRSA PCR Screening     Status: None   Collection Time: 10/04/20  1:45 AM   Specimen: Nasal Mucosa; Nasopharyngeal  Result Value Ref Range Status   MRSA by PCR NEGATIVE NEGATIVE Final     Comment:        The GeneXpert MRSA Assay (FDA approved for NASAL specimens only), is one component of a comprehensive MRSA colonization surveillance program. It is not intended to diagnose MRSA infection nor to guide or monitor treatment for MRSA infections. Performed at [facility], [place] [address], [place], [place] [address]   Respiratory (~20 pathogens) panel by PCR     Status: None   Collection Time: 10/05/20  3:20 AM   Specimen: Tracheal Aspirate; Respiratory  Result Value Ref Range Status   Adenovirus NOT DETECTED NOT DETECTED Final   Coronavirus 229E NOT DETECTED NOT DETECTED Final    Comment: (NOTE) The Coronavirus on the Respiratory Panel, DOES NOT test for the novel  Coronavirus (2019 nCoV)    Coronavirus HKU1 NOT DETECTED NOT DETECTED Final   Coronavirus NL63 NOT DETECTED NOT DETECTED Final   Coronavirus OC43 NOT DETECTED NOT DETECTED Final   Metapneumovirus NOT DETECTED NOT DETECTED Final   Rhinovirus / Enterovirus NOT DETECTED NOT DETECTED Final   Influenza A NOT DETECTED NOT DETECTED Final   Influenza B NOT DETECTED NOT DETECTED Final   Parainfluenza Virus 1 NOT DETECTED NOT DETECTED Final   Parainfluenza Virus 2 NOT DETECTED NOT DETECTED Final   Parainfluenza Virus 3 NOT  DETECTED NOT DETECTED Final   Parainfluenza Virus 4 NOT DETECTED NOT DETECTED Final   Respiratory Syncytial Virus NOT DETECTED NOT DETECTED Final   Bordetella pertussis NOT DETECTED NOT DETECTED Final   Bordetella Parapertussis NOT DETECTED NOT DETECTED Final   Chlamydophila pneumoniae NOT DETECTED NOT DETECTED Final   Mycoplasma pneumoniae NOT DETECTED NOT DETECTED Final    Comment: Performed at [facility], [place] [address] [place], [place] [address]  Culture, respiratory (non-expectorated)     Status: None   Collection Time: 10/05/20 10:15 AM   Specimen: Tracheal Aspirate; Respiratory  Result Value Ref Range Status   Specimen Description TRACHEAL ASPIRATE  Final   Special  Requests NONE  Final   Gram Stain   Final    ABUNDANT WBC PRESENT,BOTH PMN AND MONONUCLEAR FEW GRAM NEGATIVE RODS Performed at [facility], [place] [address] [place], [place] [address]    Culture ABUNDANT PSEUDOMONAS AERUGINOSA  Final   Report Status 10/07/2020 FINAL  Final   Organism ID, Bacteria PSEUDOMONAS AERUGINOSA  Final      Susceptibility   Pseudomonas aeruginosa - MIC*    CEFTAZIDIME 4 SENSITIVE Sensitive     CIPROFLOXACIN 0.5 SENSITIVE Sensitive     GENTAMICIN 2 SENSITIVE Sensitive     IMIPENEM 2 SENSITIVE Sensitive     PIP/TAZO 8 SENSITIVE Sensitive     CEFEPIME 2 SENSITIVE Sensitive     * ABUNDANT PSEUDOMONAS AERUGINOSA     Time coordinating discharge in minutes: 65  SIGNED:   Debbe Odea, MD  [facility] 10/13/2020, 11:58 AM

## 2020-10-15 ENCOUNTER — Telehealth: Payer: Self-pay | Admitting: Family Medicine

## 2020-10-15 ENCOUNTER — Other Ambulatory Visit: Payer: Self-pay | Admitting: *Deleted

## 2020-10-15 ENCOUNTER — Telehealth: Payer: Self-pay

## 2020-10-15 ENCOUNTER — Telehealth: Payer: Self-pay | Admitting: Adult Health

## 2020-10-15 ENCOUNTER — Other Ambulatory Visit: Payer: Self-pay

## 2020-10-15 NOTE — Progress Notes (Signed)
Please Void note, open in error.   

## 2020-10-15 NOTE — Patient Outreach (Addendum)
[facility]) Care Management  10/15/2020  Kenneth [name] [birth year] [id number]   Member was readmitted 1/12-1/22 for PEA, requiring intubation again. Call placed to member for hospital follow up, no answer, HIPAA compliant voice message left. Will follow up within the next 3-4 business days.    Update:  Incoming call received from member, state he is feeling much better.  Was discharged home with home health, they will visit tomorrow.  Office visit scheduled with pulmonology on 1/31, fiance will provide transportation.  Will also contact PCP office to schedule follow up appointment.  Report he is sore from compressions, otherwise no complaints.  Discussed concern for member's decline in condition, inquired about advanced life planning.  State he has not thought about it much, but will speak with his significant other.  Educated on palliative care program and [facility], agrees to hearing more about program.  Denies any urgent concerns, encouraged to contact this care manager with questions.  Agrees to follow up within the next 2 weeks.  Will place referral to [facility].  Goals Addressed            This Visit's Progress   . [facility] - Track and Manage Fluids and Swelling-Heart Failure   On track    Timeframe:  Short-Term Goal Priority:  Medium Start Date:     10/16/2019                        Expected End Date:    11/15/2020                   Follow Up Date 10/29/2020   - track weight in diary - use salt in moderation - watch for swelling in feet, ankles and legs every day - weigh myself daily    Why is this important?    It is important to check your weight daily and watch how much salt and liquids you have.   It will help you to manage your heart failure.    Notes:     . [facility] - Track and Manage My Symptoms   On track    Timeframe:  Short-Term Goal Priority:  Medium Start Date:     10/16/2019                        Expected End Date:    2/242022                      - develop a rescue plan - keep follow-up appointments    Why is this important?   Tracking your symptoms and other information about your health helps your doctor plan your care.  Write down the symptoms, the time of day, what you were doing and what medicine you are taking.  You will soon learn how to manage your symptoms.     Notes:   11/10 - reminded of appointments next week  12/14 - Reviewed upcoming appointments with Cardiology and pulmonology    . [facility] - Track and Manage My Triggers   On track    Timeframe:  Long-Range Goal Priority:  High Start Date:        09/04/2020                     Expected End Date:         11/05/2020                -  eliminate smoking in my home - identify and remove indoor air pollutants - listen for public air quality announcements every day    Why is this important?   Triggers are activities or things, like tobacco smoke or cold weather, that make your COPD (chronic obstructive pulmonary disease) flare-up.  Knowing these triggers helps you plan how to stay away from them.  When you cannot remove them, you can learn how to manage them.     Notes:   11/10 - Reviewed use of inhalers and oxygen  12/14 - Reviewed COPD action plan, encouraged to contact provider office with concerns        [name], RN, MSN [facility] Care Management  [facility] Manager [phone]

## 2020-10-15 NOTE — Telephone Encounter (Signed)
error 

## 2020-10-16 ENCOUNTER — Telehealth: Payer: Self-pay | Admitting: Adult Health

## 2020-10-16 ENCOUNTER — Telehealth: Payer: Self-pay

## 2020-10-16 ENCOUNTER — Inpatient Hospital Stay: Payer: Medicare Other | Admitting: Family Medicine

## 2020-10-16 DIAGNOSIS — I10 Essential (primary) hypertension: Secondary | ICD-10-CM | POA: Diagnosis not present

## 2020-10-16 DIAGNOSIS — I480 Paroxysmal atrial fibrillation: Secondary | ICD-10-CM | POA: Diagnosis not present

## 2020-10-16 DIAGNOSIS — I5042 Chronic combined systolic (congestive) and diastolic (congestive) heart failure: Secondary | ICD-10-CM | POA: Diagnosis not present

## 2020-10-16 DIAGNOSIS — R269 Unspecified abnormalities of gait and mobility: Secondary | ICD-10-CM | POA: Diagnosis not present

## 2020-10-16 DIAGNOSIS — I219 Acute myocardial infarction, unspecified: Secondary | ICD-10-CM | POA: Diagnosis not present

## 2020-10-16 DIAGNOSIS — M6281 Muscle weakness (generalized): Secondary | ICD-10-CM | POA: Diagnosis not present

## 2020-10-16 DIAGNOSIS — J441 Chronic obstructive pulmonary disease with (acute) exacerbation: Secondary | ICD-10-CM | POA: Diagnosis not present

## 2020-10-16 DIAGNOSIS — E119 Type 2 diabetes mellitus without complications: Secondary | ICD-10-CM | POA: Diagnosis not present

## 2020-10-16 NOTE — Telephone Encounter (Signed)
Bessie Yolanda Manges states patient needs samples of Daliresp. Does not need Furesomide. Bessie phone number is [phone].

## 2020-10-16 NOTE — Telephone Encounter (Signed)
ATC patient LMTCB as Kenneth [name] is not listed on DPR and neither is the phone number listed

## 2020-10-16 NOTE — Progress Notes (Signed)
Chronic Care Management Pharmacy Assistant   Name: Kenneth [name]  MRN: [id number] DOB: Sep 23, 1960  Reason for Encounter: Medication Review/Patient Assistance for [place].  PCP : [name], MD  Allergies:   Allergies  Allergen Reactions  . Lisinopril Swelling  . Other Other (See Comments)    Lettuce : rash  . Tomato Rash    Medications: Outpatient Encounter Medications as of 10/16/2020  Medication Sig Note  . acetaminophen (TYLENOL) 325 MG tablet Take 650 mg by mouth every 6 (six) hours as needed for mild pain or headache.   . albuterol (PROVENTIL) (2.5 MG/3ML) 0.083% nebulizer solution INHALE 1 VIAL VIA NEBULIZER 5 TIMES DAILY AS NEEDED FOR WHEEZING OR FOR SHORTNESS OF BREATH   . apixaban (ELIQUIS) 5 MG TABS tablet Take 1 tablet (5 mg total) by mouth 2 (two) times daily. 10/04/2020: Last filled 08/03/20  . ARIPiprazole (ABILIFY) 5 MG tablet Take 5 mg by mouth daily. 10/04/2020: Last filled 08/24/20  . Blood Glucose Monitoring Suppl (ONE TOUCH ULTRA 2) w/Device KIT Use to test blood sugars 1-2 times daily.   . busPIRone (BUSPAR) 15 MG tablet Take 1 tablet (15 mg total) by mouth 2 (two) times daily. 10/04/2020: Last filled 07/27/20  . carvedilol (COREG) 12.5 MG tablet Take 1 tablet (12.5 mg total) by mouth 2 (two) times daily with a meal. 10/04/2020: Last filled 08/03/20  . DM-GG & DM-APAP-CPM (CORICIDIN HBP DAY/NIGHT COLD) 10-20 &15-200-2 MG MISC Take 1 tablet by mouth every 12 (twelve) hours as needed (cold symptoms).   . famotidine (PEPCID) 20 MG tablet Take 20 mg by mouth daily.   [name] FLUoxetine (PROZAC) 20 MG capsule TAKE 3 CAPSULES BY MOUTH  DAILY (Patient taking differently: Take 60 mg by mouth daily.) 10/04/2020: Last filled 08/08/20  . Fluticasone-Umeclidin-Vilant (TRELEGY ELLIPTA) 100-62.5-25 MCG/INH AEPB Inhale 1 puff into the lungs daily. 10/04/2020: Last filled 08/03/20  . furosemide (LASIX) 40 MG tablet Take 1 tablet (40 mg total) by mouth 2 (two) times daily.  10/04/2020: Last filled 08/03/20  . gabapentin (NEURONTIN) 300 MG capsule TAKE ONE CAPSULE BY MOUTH THREE TIMES DAILY (Patient taking differently: Take 300 mg by mouth 3 (three) times daily.) 10/04/2020: Last filled 08/03/20  . glucose blood (ONETOUCH ULTRA) test strip USE TO TEST BLOOD SUGAR 1-2 TIMES DAILY   . guaiFENesin (MUCINEX) 600 MG 12 hr tablet Take 1 tablet (600 mg total) by mouth 2 (two) times daily. 10/04/2020: Last filled 09/24/20  . ipratropium-albuterol (DUONEB) 0.5-2.5 (3) MG/3ML SOLN Take 3 mLs by nebulization every 6 (six) hours as needed. 10/04/2020: Last filled 09/24/20  . isosorbide mononitrate (IMDUR) 30 MG 24 hr tablet Take 30 mg by mouth daily. 10/04/2020: Last filled 08/03/20  . Lancets (ONETOUCH ULTRASOFT) lancets USE TO TEST BLOOD SUGAR 1-2 TIMES DAILY   . lubiprostone ([name]) 8 MCG capsule TAKE 1 CAPSULE(8 MCG) BY MOUTH TWICE DAILY WITH A MEAL (Patient taking differently: Take 8 mcg by mouth 2 (two) times daily with a meal.) 10/04/2020: Lasft filled 08/03/20  . Multiple Vitamin (MULTIVITAMIN WITH MINERALS) TABS tablet Take 1 tablet by mouth daily.   . nitroGLYCERIN (NITROSTAT) 0.4 MG SL tablet DISSOLVE 1 TABLET UNDER THE TONGUE EVERY 5 MINUTES AS&nbsp;&nbsp;NEEDED FOR CHEST PAIN. MAX&nbsp;&nbsp;OF 3 TABLETS IN 15 MINUTES. CALL 911 IF PAIN PERSISTS. (Patient taking differently: Place 0.4 mg under the tongue every 5 (five) minutes as needed for chest pain.) 10/04/2020: Last filled 07/25/20  . pantoprazole (PROTONIX) 40 MG tablet TAKE ONE TABLET BY MOUTH EVERY MORNING (  Patient taking differently: Take 40 mg by mouth daily.) 10/04/2020: Last filled 08/03/20  . predniSONE (DELTASONE) 10 MG tablet Take 1 tablet (10 mg total) by mouth daily. Take 40 mg x 1 day, 30 mg x 3 days, 20 mg x 3 days and then 10 mg daily- do not stop until your doctor tells you to.   . Roflumilast (DALIRESP) 250 MCG TABS Take 250 mcg by mouth daily.   . rosuvastatin (CRESTOR) 20 MG tablet Take 1 tablet (20 mg total) by  mouth daily. 10/04/2020: Last filled 08/27/20  . traZODone (DESYREL) 50 MG tablet TAKE 1/2 TO 1 TABLET BY  MOUTH AT BEDTIME AS NEEDED  FOR SLEEP. (Patient taking differently: Take 25-50 mg by mouth at bedtime as needed for sleep.) 10/04/2020: No fill record in past 6 months   No facility-administered encounter medications on file as of 10/16/2020.    Current Diagnosis: Patient Active Problem List   Diagnosis Date Noted  . Cardiac arrest ([place]) 10/04/2020  . COPD with acute exacerbation ([place]) 09/23/2020  . Acute kidney injury superimposed on CKD ([place]) 09/23/2020  . Acute respiratory failure ([place]) 09/13/2020  . Thiamine deficiency 07/02/2020  . Acute respiratory distress 06/21/2020  . Chronic respiratory failure with hypoxia ([place]) 06/01/2020  . Obstructive sleep apnea 05/02/2020  . Type 2 diabetes mellitus with diabetic neuropathy, without long-term current use of insulin ([place]) 04/24/2020  . Hyperlipidemia   . Acute on chronic combined systolic and diastolic CHF (congestive heart failure) ([place]) 04/07/2020  . Chronic constipation 07/19/2019  . Hospital discharge follow-up 06/17/2019  . Shortness of breath 06/12/2019  . Chest tightness 06/12/2019  . Snoring 03/29/2019  . Insomnia due to other mental disorder 11/15/2018  . Anticoagulant long-term use 10/21/2018  . Gastroesophageal reflux disease without esophagitis 08/10/2018  . Tobacco abuse 08/10/2018  . Chest pain 07/27/2018  . PAF (paroxysmal atrial fibrillation) ([place]) 07/27/2018  . Mild renal insufficiency 07/27/2018  . Chronic combined systolic and diastolic heart failure ([place]) 07/27/2018  . Slow transit constipation 07/20/2018  . Essential hypertension 07/06/2018  . Chronic obstructive pulmonary disease ([place]) 07/06/2018  . Controlled type 2 diabetes mellitus without complication, without long-term current use of insulin ([place]) 07/06/2018  . Depression 07/06/2018  . Macrocytic anemia 07/06/2018  . Healthcare maintenance 07/06/2018   . Alcohol abuse 07/06/2018    Goals Addressed   None    Spoke to patient to inform him that we are sending her a Patient assistance form  for Daliresp  by mail.Informed patient to include a copy of his proof of income AND a copy of his Explanation of Benefits (EOB) statement from her insurance.Advised patient to return the PAP forms back to the [place] office.  I reach out to pulmonary requesting samples of Daliresp on 10/16/2020.   Patient verbalized understanding.  [facility] Pharmacist Assistant [phone]   Follow-Up:  Patient Assistance Coordination

## 2020-10-17 ENCOUNTER — Telehealth: Payer: Self-pay | Admitting: *Deleted

## 2020-10-17 DIAGNOSIS — J449 Chronic obstructive pulmonary disease, unspecified: Secondary | ICD-10-CM | POA: Diagnosis not present

## 2020-10-17 NOTE — Telephone Encounter (Signed)
Patient is returning phone call. Patient phone number is [phone].

## 2020-10-17 NOTE — Telephone Encounter (Signed)
Called and spoke with patient, he states he has been unable to get his daliresp prescription filled because it costs over $300.  Advised that we may have to get him to apply for patient assistance through the manufacturer.  I let him know I would make Tammy aware and see how she wants to proceed.  I looked under good rx and it was no cheaper.  I have printed the patient assistance application in case this is the way you want to proceed.  Tammy, please advise.

## 2020-10-18 ENCOUNTER — Telehealth: Payer: Self-pay | Admitting: *Deleted

## 2020-10-18 DIAGNOSIS — I5042 Chronic combined systolic (congestive) and diastolic (congestive) heart failure: Secondary | ICD-10-CM | POA: Diagnosis not present

## 2020-10-18 DIAGNOSIS — I10 Essential (primary) hypertension: Secondary | ICD-10-CM | POA: Diagnosis not present

## 2020-10-18 DIAGNOSIS — M6281 Muscle weakness (generalized): Secondary | ICD-10-CM | POA: Diagnosis not present

## 2020-10-18 DIAGNOSIS — E119 Type 2 diabetes mellitus without complications: Secondary | ICD-10-CM | POA: Diagnosis not present

## 2020-10-18 DIAGNOSIS — J441 Chronic obstructive pulmonary disease with (acute) exacerbation: Secondary | ICD-10-CM | POA: Diagnosis not present

## 2020-10-18 DIAGNOSIS — R269 Unspecified abnormalities of gait and mobility: Secondary | ICD-10-CM | POA: Diagnosis not present

## 2020-10-18 DIAGNOSIS — I219 Acute myocardial infarction, unspecified: Secondary | ICD-10-CM | POA: Diagnosis not present

## 2020-10-18 DIAGNOSIS — I480 Paroxysmal atrial fibrillation: Secondary | ICD-10-CM | POA: Diagnosis not present

## 2020-10-18 NOTE — Telephone Encounter (Signed)
Called and spoke with patient, advised that there is not another medication that is comparable to the daliresp.  We will proceed with the patient assistance program for the daliresp.  Patient states he will stop by this afternoon to fill out the paperwork.

## 2020-10-18 NOTE — Telephone Encounter (Signed)
See phone note from 1.25.22. Will close encounter.

## 2020-10-18 NOTE — Telephone Encounter (Signed)
Patient called, made aware, he will come by to fill out the forms and bring proof of income.  Advised I would call [employer] and try to get the information from 2021 regarding out of pocket expenses.

## 2020-10-18 NOTE — Telephone Encounter (Signed)
Called and spoke with [facility] with [employer] regarding patient spend down from 2021.  Advised that we have to request this by fax:  [phone].  She also provided the website to fill out a form with his information for the request:  [email or web address].  Will print out form and fax request for records.  Patient here to fill out application.

## 2020-10-18 NOTE — Telephone Encounter (Signed)
Lets try Patient assistance .

## 2020-10-19 ENCOUNTER — Other Ambulatory Visit: Payer: Self-pay | Admitting: *Deleted

## 2020-10-19 ENCOUNTER — Telehealth: Payer: Self-pay | Admitting: Family Medicine

## 2020-10-19 ENCOUNTER — Ambulatory Visit: Payer: Medicare Other | Admitting: *Deleted

## 2020-10-19 NOTE — Telephone Encounter (Signed)
[facility] is calling to get orders to enroll patient into the Palliative Care Program. Please call her back at [phone].

## 2020-10-19 NOTE — Telephone Encounter (Signed)
Verbal order given for palliative care.

## 2020-10-19 NOTE — Telephone Encounter (Signed)
Patient checking on forms filled out 10/18/2020. Patient phone number is [phone].

## 2020-10-19 NOTE — Patient Outreach (Signed)
[facility]) Care Management  10/19/2020  Kenneth [name] [birth year] [id number]   RED ON EMMI ALERT - General Discharge  Day # 4 Date: 1/27 Red Alert Reason: Unfilled prescriptions, Not able to fill today or tomorrow, Lost interest in things, and Other questions/problems.   Outreach attempt #1, unsuccessful, HIPAA compliant voice message left.    Plan: RN CM will follow up within the next 3-4 business days.  [name], [place], MSN [facility] Care Management  [facility] Manager [phone]

## 2020-10-19 NOTE — Telephone Encounter (Signed)
Calle and left detailed vm per DPR for patient letting him know that I have been unable to get the records from [facility] regarding his out of expense for 2021 through the fax # I was provided by the pharmacy.  Advised that he will have to contact his insurance company to request those records.  Advised to call the office with any questions.

## 2020-10-20 DIAGNOSIS — J449 Chronic obstructive pulmonary disease, unspecified: Secondary | ICD-10-CM | POA: Diagnosis not present

## 2020-10-22 ENCOUNTER — Other Ambulatory Visit: Payer: Self-pay | Admitting: Cardiology

## 2020-10-22 ENCOUNTER — Telehealth: Payer: Medicare Other

## 2020-10-22 ENCOUNTER — Emergency Department (HOSPITAL_COMMUNITY): Payer: Medicare Other

## 2020-10-22 ENCOUNTER — Ambulatory Visit (INDEPENDENT_AMBULATORY_CARE_PROVIDER_SITE_OTHER): Payer: Medicare Other

## 2020-10-22 ENCOUNTER — Other Ambulatory Visit: Payer: Self-pay | Admitting: Nurse Practitioner

## 2020-10-22 ENCOUNTER — Telehealth: Payer: Self-pay

## 2020-10-22 ENCOUNTER — Ambulatory Visit (INDEPENDENT_AMBULATORY_CARE_PROVIDER_SITE_OTHER): Payer: Medicare Other | Admitting: Adult Health

## 2020-10-22 ENCOUNTER — Encounter: Payer: Self-pay | Admitting: Adult Health

## 2020-10-22 ENCOUNTER — Other Ambulatory Visit: Payer: Self-pay | Admitting: *Deleted

## 2020-10-22 ENCOUNTER — Other Ambulatory Visit: Payer: Self-pay

## 2020-10-22 ENCOUNTER — Inpatient Hospital Stay (HOSPITAL_COMMUNITY)
Admission: EM | Admit: 2020-10-22 | Discharge: 2020-10-24 | DRG: 190 | Disposition: A | Discharge status: Home / Self Care | Payer: Medicare Other | Attending: Internal Medicine | Admitting: Internal Medicine

## 2020-10-22 DIAGNOSIS — F419 Anxiety disorder, unspecified: Secondary | ICD-10-CM | POA: Diagnosis present

## 2020-10-22 DIAGNOSIS — I5043 Acute on chronic combined systolic (congestive) and diastolic (congestive) heart failure: Secondary | ICD-10-CM | POA: Diagnosis not present

## 2020-10-22 DIAGNOSIS — I469 Cardiac arrest, cause unspecified: Secondary | ICD-10-CM

## 2020-10-22 DIAGNOSIS — Z888 Allergy status to other drugs, medicaments and biological substances status: Secondary | ICD-10-CM

## 2020-10-22 DIAGNOSIS — E1165 Type 2 diabetes mellitus with hyperglycemia: Secondary | ICD-10-CM | POA: Diagnosis present

## 2020-10-22 DIAGNOSIS — E1122 Type 2 diabetes mellitus with diabetic chronic kidney disease: Secondary | ICD-10-CM | POA: Diagnosis not present

## 2020-10-22 DIAGNOSIS — J9621 Acute and chronic respiratory failure with hypoxia: Secondary | ICD-10-CM | POA: Diagnosis not present

## 2020-10-22 DIAGNOSIS — I5023 Acute on chronic systolic (congestive) heart failure: Secondary | ICD-10-CM | POA: Diagnosis present

## 2020-10-22 DIAGNOSIS — K219 Gastro-esophageal reflux disease without esophagitis: Secondary | ICD-10-CM | POA: Diagnosis not present

## 2020-10-22 DIAGNOSIS — Z833 Family history of diabetes mellitus: Secondary | ICD-10-CM

## 2020-10-22 DIAGNOSIS — I130 Hypertensive heart and chronic kidney disease with heart failure and stage 1 through stage 4 chronic kidney disease, or unspecified chronic kidney disease: Secondary | ICD-10-CM | POA: Diagnosis present

## 2020-10-22 DIAGNOSIS — J441 Chronic obstructive pulmonary disease with (acute) exacerbation: Principal | ICD-10-CM | POA: Diagnosis present

## 2020-10-22 DIAGNOSIS — I480 Paroxysmal atrial fibrillation: Secondary | ICD-10-CM | POA: Diagnosis present

## 2020-10-22 DIAGNOSIS — N179 Acute kidney failure, unspecified: Secondary | ICD-10-CM | POA: Diagnosis not present

## 2020-10-22 DIAGNOSIS — K5909 Other constipation: Secondary | ICD-10-CM | POA: Diagnosis present

## 2020-10-22 DIAGNOSIS — B965 Pseudomonas (aeruginosa) (mallei) (pseudomallei) as the cause of diseases classified elsewhere: Secondary | ICD-10-CM | POA: Diagnosis present

## 2020-10-22 DIAGNOSIS — I429 Cardiomyopathy, unspecified: Secondary | ICD-10-CM | POA: Diagnosis not present

## 2020-10-22 DIAGNOSIS — E119 Type 2 diabetes mellitus without complications: Secondary | ICD-10-CM

## 2020-10-22 DIAGNOSIS — R0689 Other abnormalities of breathing: Secondary | ICD-10-CM | POA: Diagnosis not present

## 2020-10-22 DIAGNOSIS — I313 Pericardial effusion (noninflammatory): Secondary | ICD-10-CM | POA: Diagnosis not present

## 2020-10-22 DIAGNOSIS — F32A Depression, unspecified: Secondary | ICD-10-CM | POA: Diagnosis present

## 2020-10-22 DIAGNOSIS — J439 Emphysema, unspecified: Secondary | ICD-10-CM | POA: Diagnosis not present

## 2020-10-22 DIAGNOSIS — E1140 Type 2 diabetes mellitus with diabetic neuropathy, unspecified: Secondary | ICD-10-CM

## 2020-10-22 DIAGNOSIS — I10 Essential (primary) hypertension: Secondary | ICD-10-CM | POA: Diagnosis present

## 2020-10-22 DIAGNOSIS — Z8674 Personal history of sudden cardiac arrest: Secondary | ICD-10-CM

## 2020-10-22 DIAGNOSIS — Z91018 Allergy to other foods: Secondary | ICD-10-CM

## 2020-10-22 DIAGNOSIS — Z7901 Long term (current) use of anticoagulants: Secondary | ICD-10-CM

## 2020-10-22 DIAGNOSIS — Z8249 Family history of ischemic heart disease and other diseases of the circulatory system: Secondary | ICD-10-CM

## 2020-10-22 DIAGNOSIS — I248 Other forms of acute ischemic heart disease: Secondary | ICD-10-CM | POA: Diagnosis not present

## 2020-10-22 DIAGNOSIS — E785 Hyperlipidemia, unspecified: Secondary | ICD-10-CM | POA: Diagnosis not present

## 2020-10-22 DIAGNOSIS — G4733 Obstructive sleep apnea (adult) (pediatric): Secondary | ICD-10-CM | POA: Diagnosis not present

## 2020-10-22 DIAGNOSIS — R000 Tachycardia, unspecified: Secondary | ICD-10-CM | POA: Diagnosis not present

## 2020-10-22 DIAGNOSIS — Z7952 Long term (current) use of systemic steroids: Secondary | ICD-10-CM

## 2020-10-22 DIAGNOSIS — R0602 Shortness of breath: Secondary | ICD-10-CM | POA: Diagnosis not present

## 2020-10-22 DIAGNOSIS — I5042 Chronic combined systolic (congestive) and diastolic (congestive) heart failure: Secondary | ICD-10-CM

## 2020-10-22 DIAGNOSIS — J9611 Chronic respiratory failure with hypoxia: Secondary | ICD-10-CM

## 2020-10-22 DIAGNOSIS — F418 Other specified anxiety disorders: Secondary | ICD-10-CM | POA: Diagnosis present

## 2020-10-22 DIAGNOSIS — Z20822 Contact with and (suspected) exposure to covid-19: Secondary | ICD-10-CM | POA: Diagnosis present

## 2020-10-22 DIAGNOSIS — J449 Chronic obstructive pulmonary disease, unspecified: Secondary | ICD-10-CM | POA: Diagnosis not present

## 2020-10-22 DIAGNOSIS — R0902 Hypoxemia: Secondary | ICD-10-CM | POA: Diagnosis not present

## 2020-10-22 DIAGNOSIS — E1159 Type 2 diabetes mellitus with other circulatory complications: Secondary | ICD-10-CM | POA: Diagnosis present

## 2020-10-22 DIAGNOSIS — F17210 Nicotine dependence, cigarettes, uncomplicated: Secondary | ICD-10-CM | POA: Diagnosis not present

## 2020-10-22 DIAGNOSIS — Z79899 Other long term (current) drug therapy: Secondary | ICD-10-CM

## 2020-10-22 DIAGNOSIS — N182 Chronic kidney disease, stage 2 (mild): Secondary | ICD-10-CM | POA: Diagnosis present

## 2020-10-22 DIAGNOSIS — Z841 Family history of disorders of kidney and ureter: Secondary | ICD-10-CM

## 2020-10-22 DIAGNOSIS — N189 Chronic kidney disease, unspecified: Secondary | ICD-10-CM | POA: Diagnosis not present

## 2020-10-22 DIAGNOSIS — I152 Hypertension secondary to endocrine disorders: Secondary | ICD-10-CM | POA: Diagnosis present

## 2020-10-22 LAB — CBC
HCT: 33.4 % — ABNORMAL LOW (ref 39.0–52.0)
Hemoglobin: 9.7 g/dL — ABNORMAL LOW (ref 13.0–17.0)
MCH: 27.2 pg (ref 26.0–34.0)
MCHC: 29 g/dL — ABNORMAL LOW (ref 30.0–36.0)
MCV: 93.6 fL (ref 80.0–100.0)
Platelets: 339 10*3/uL (ref 150–400)
RBC: 3.57 MIL/uL — ABNORMAL LOW (ref 4.22–5.81)
RDW: 13.7 % (ref 11.5–15.5)
WBC: 8.9 10*3/uL (ref 4.0–10.5)
nRBC: 0 % (ref 0.0–0.2)

## 2020-10-22 LAB — I-STAT VENOUS BLOOD GAS, ED
Acid-Base Excess: 13 mmol/L — ABNORMAL HIGH (ref 0.0–2.0)
Bicarbonate: 40.1 mmol/L — ABNORMAL HIGH (ref 20.0–28.0)
Calcium, Ion: 1.09 mmol/L — ABNORMAL LOW (ref 1.15–1.40)
HCT: 34 % — ABNORMAL LOW (ref 39.0–52.0)
Hemoglobin: 11.6 g/dL — ABNORMAL LOW (ref 13.0–17.0)
O2 Saturation: 99 %
Potassium: 4.3 mmol/L (ref 3.5–5.1)
Sodium: 134 mmol/L — ABNORMAL LOW (ref 135–145)
TCO2: 42 mmol/L — ABNORMAL HIGH (ref 22–32)
pCO2, Ven: 64.2 mmHg — ABNORMAL HIGH (ref 44.0–60.0)
pH, Ven: 7.404 (ref 7.250–7.430)
pO2, Ven: 149 mmHg — ABNORMAL HIGH (ref 32.0–45.0)

## 2020-10-22 MED ORDER — IPRATROPIUM-ALBUTEROL 0.5-2.5 (3) MG/3ML IN SOLN
3.0000 mL | Freq: Once | RESPIRATORY_TRACT | Status: DC
  Filled 2020-10-22: qty 3

## 2020-10-22 MED ORDER — PREDNISONE 10 MG PO TABS: 10.0000 mg | ORAL_TABLET | Freq: Every day | ORAL | 2 refills | Status: DC

## 2020-10-22 MED ORDER — METHYLPREDNISOLONE SODIUM SUCC 125 MG IJ SOLR
125.0000 mg | Freq: Once | INTRAMUSCULAR | Status: AC
  Administered 2020-10-22: 125 mg via INTRAVENOUS
  Filled 2020-10-22: qty 2

## 2020-10-22 MED ORDER — IPRATROPIUM-ALBUTEROL 0.5-2.5 (3) MG/3ML IN SOLN
3.0000 mL | Freq: Once | RESPIRATORY_TRACT | Status: AC
  Administered 2020-10-23: 3 mL via RESPIRATORY_TRACT

## 2020-10-22 NOTE — ED Provider Notes (Incomplete)
[facility] Emergency Department Provider Note MRN:  [id number]  Arrival date & time: 10/22/20     Chief Complaint   Respiratory Distress   History of Present Illness   Kenneth [name] is a 60 y.o. year-old male with a history of CHF, COPD, diabetes, A. fib, hypertension presenting to the ED with chief complaint of respiratory distress.  Acute respiratory distress beginning this evening at about 8 PM.  Worsened quickly, EMS called.  Found to be hypoxic and struggling for breath, started on BiPAP.  Given 4 nitro tablets in route.  Feeling much better with the BiPAP machine.  Explains that he was recently admitted to the hospital for similar issues.  Denies any recent fever or cough, no excessive leg swelling.  Denies any chest pain.  Review of Systems  A complete 10 system review of systems was obtained and all systems are negative except as noted in the HPI and PMH.   Patient's Health History    Past Medical History:  Diagnosis Date  . Anxiety   . Arthritis   . Atrial fibrillation (HCC)   . CHF (congestive heart failure) (HCC)   . COPD (chronic obstructive pulmonary disease) (HCC)    emphysema  . Depression   . Diabetes mellitus without complication (HCC)   . Heart murmur   . Hyperlipidemia   . Hypertension   . Sleep apnea with use of continuous positive airway pressure (CPAP)     Past Surgical History:  Procedure Laterality Date  . NO PAST SURGERIES      Family History  Problem Relation Age of Onset  . Diabetes Mother   . Heart disease Mother   . Diabetes Father   . Heart disease Father   . Diabetes Sister   . Heart disease Sister   . Kidney disease Sister   . Coronary artery disease Brother   . Liver disease Maternal Uncle     Social History   Socioeconomic History  . Marital status: Divorced    Spouse name: Not on file  . Number of children: 1  . Years of education: Not on file  . Highest education level: Not on file  Occupational  History  . Occupation: disabled  Tobacco Use  . Smoking status: Former Smoker    Packs/day: 2.00    Years: 30.00    Pack years: 60.00    Types: Cigarettes    Quit date: 05/01/2019    Years since quitting: 1.4  . Smokeless tobacco: Never Used  . Tobacco comment: smokes 2-3cigs per day  Vaping Use  . Vaping Use: Never used  Substance and Sexual Activity  . Alcohol use: Yes    Comment: Drinks up to six beers around a game  . Drug use: Yes    Types: Marijuana    Comment: occ marijuana  . Sexual activity: Yes    Partners: Female  Other Topics Concern  . Not on file  Social History Narrative  . Not on file   Social Determinants of Health   Financial Resource Strain: Low Risk   . Difficulty of Paying Living Expenses: Not hard at all  Food Insecurity: No Food Insecurity  . Worried About Programme researcher, broadcasting/film/video in the Last Year: Never true  . Ran Out of Food in the Last Year: Never true  Transportation Needs: No Transportation Needs  . Lack of Transportation (Medical): No  . Lack of Transportation (Non-Medical): No  Physical Activity: Not on file  Stress: Not on  file  Social Connections: Not on file  Intimate Partner Violence: Not on file     Physical Exam   Vitals:   10/22/20 2242 10/22/20 2247  BP:  (!) 163/99  Pulse:  (!) 121  Temp:  97.7 F (36.5 C)  SpO2: 97% 100%    CONSTITUTIONAL: Well-appearing, NAD NEURO:  Alert and oriented x 3, no focal deficits EYES:  eyes equal and reactive ENT/NECK:  no LAD, no JVD CARDIO: Regular rate, well-perfused, normal S1 and S2 PULM: Poor air movement, tolerating BiPAP well GI/GU:  normal bowel sounds, non-distended, non-tender MSK/SPINE:  No gross deformities, no edema SKIN:  no rash, atraumatic PSYCH:  Appropriate speech and behavior  *Additional and/or pertinent findings included in MDM below  Diagnostic and Interventional Summary    EKG Interpretation  Date/Time:  Monday October 22 2020 22:45:24 EST Ventricular Rate:   123 PR Interval:    QRS Duration: 75 QT Interval:  323 QTC Calculation: 462 R Axis:   65 Text Interpretation: Atrial fibrillation LVH with secondary repolarization abnormality Confirmed by Kennis Carina [phone]) on 10/22/2020 10:59:28 PM      Labs Reviewed  I-STAT VENOUS BLOOD GAS, ED - Abnormal; Notable for the following components:      Result Value   pCO2, Ven 64.2 (*)    pO2, Ven 149.0 (*)    Bicarbonate 40.1 (*)    TCO2 42 (*)    Acid-Base Excess 13.0 (*)    Sodium 134 (*)    Calcium, Ion 1.09 (*)    HCT 34.0 (*)    Hemoglobin 11.6 (*)    All other components within normal limits  SARS CORONAVIRUS 2 BY RT PCR (HOSPITAL ORDER, PERFORMED IN [facility])  CBC  COMPREHENSIVE METABOLIC PANEL  LACTIC ACID, PLASMA  BRAIN NATRIURETIC PEPTIDE  TROPONIN I (HIGH SENSITIVITY)    DG Chest Port 1 View  Final Result      Medications  ipratropium-albuterol (DUONEB) 0.5-2.5 (3) MG/3ML nebulizer solution 3 mL (has no administration in time range)  ipratropium-albuterol (DUONEB) 0.5-2.5 (3) MG/3ML nebulizer solution 3 mL (has no administration in time range)  methylPREDNISolone sodium succinate (SOLU-MEDROL) 125 mg/2 mL injection 125 mg (125 mg Intravenous Given 10/22/20 2306)     Procedures  /  Critical Care .Critical Care Performed by: Sabas Sous, MD Authorized by: Sabas Sous, MD   Critical care provider statement:    Critical care time (minutes):  45   Critical care was necessary to treat or prevent imminent or life-threatening deterioration of the following conditions:  Respiratory failure   Critical care was time spent personally by me on the following activities:  Discussions with consultants, evaluation of patient's response to treatment, examination of patient, ordering and performing treatments and interventions, ordering and review of laboratory studies, ordering and review of radiographic studies, pulse oximetry, re-evaluation of patient's condition,  obtaining history from patient or surrogate and review of old charts    ED Course and Medical Decision Making  I have reviewed the triage vital signs, the nursing notes, and pertinent available records from the EMR.  Listed above are laboratory and imaging tests that I personally ordered, reviewed, and interpreted and then considered in my medical decision making (see below for details).  History of CHF and COPD, frequent admissions, was admitted earlier this month after cardiac arrest and return of spontaneous circulation, was intubated during this admission.     Elmer Sow. Pilar Plate, MD [facility] Emergency Medicine [facility]  [facility] [email or web address]  Final Clinical Impressions(s) / ED Diagnoses  No diagnosis found.  ED Discharge Orders    None       Discharge Instructions Discussed with and Provided to Patient:   Discharge Instructions   None

## 2020-10-22 NOTE — Progress Notes (Signed)
Chronic Care Management Pharmacy Assistant   Name: Kenneth [name]  MRN: [id number] DOB: 1960-11-28  Reason for Encounter: Medication Review  PCP : [name], MD  Allergies:   Allergies  Allergen Reactions  . Lisinopril Swelling  . Other Other (See Comments)    Lettuce : rash  . Tomato Rash    Medications: Outpatient Encounter Medications as of 10/22/2020  Medication Sig Note  . acetaminophen (TYLENOL) 325 MG tablet Take 650 mg by mouth every 6 (six) hours as needed for mild pain or headache.   . albuterol (PROVENTIL) (2.5 MG/3ML) 0.083% nebulizer solution INHALE 1 VIAL VIA NEBULIZER 5 TIMES DAILY AS NEEDED FOR WHEEZING OR FOR SHORTNESS OF BREATH   . ARIPiprazole (ABILIFY) 5 MG tablet Take 5 mg by mouth daily. 10/04/2020: Last filled 08/24/20  . Blood Glucose Monitoring Suppl (ONE TOUCH ULTRA 2) w/Device KIT Use to test blood sugars 1-2 times daily.   . busPIRone (BUSPAR) 15 MG tablet Take 1 tablet (15 mg total) by mouth 2 (two) times daily. 10/04/2020: Last filled 07/27/20  . carvedilol (COREG) 12.5 MG tablet Take 1 tablet (12.5 mg total) by mouth 2 (two) times daily with a meal. 10/04/2020: Last filled 08/03/20  . DM-GG & DM-APAP-CPM (CORICIDIN HBP DAY/NIGHT COLD) 10-20 &15-200-2 MG MISC Take 1 tablet by mouth every 12 (twelve) hours as needed (cold symptoms).   [name] ELIQUIS 5 MG TABS tablet TAKE ONE TABLET BY MOUTH EVERY MORNING and TAKE ONE TABLET BY MOUTH EVERY EVENING   . famotidine (PEPCID) 20 MG tablet Take 20 mg by mouth daily.   [name] FLUoxetine (PROZAC) 20 MG capsule TAKE 3 CAPSULES BY MOUTH  DAILY (Patient taking differently: Take 60 mg by mouth daily.) 10/04/2020: Last filled 08/08/20  . Fluticasone-Umeclidin-Vilant (TRELEGY ELLIPTA) 100-62.5-25 MCG/INH AEPB Inhale 1 puff into the lungs daily. 10/04/2020: Last filled 08/03/20  . furosemide (LASIX) 40 MG tablet Take 1 tablet (40 mg total) by mouth 2 (two) times daily. 10/04/2020: Last filled 08/03/20  . gabapentin  (NEURONTIN) 300 MG capsule TAKE ONE CAPSULE BY MOUTH THREE TIMES DAILY (Patient taking differently: Take 300 mg by mouth 3 (three) times daily.) 10/04/2020: Last filled 08/03/20  . glucose blood (ONETOUCH ULTRA) test strip USE TO TEST BLOOD SUGAR 1-2 TIMES DAILY   . guaiFENesin (MUCINEX) 600 MG 12 hr tablet Take 1 tablet (600 mg total) by mouth 2 (two) times daily. 10/04/2020: Last filled 09/24/20  . ipratropium-albuterol (DUONEB) 0.5-2.5 (3) MG/3ML SOLN Take 3 mLs by nebulization every 6 (six) hours as needed. 10/04/2020: Last filled 09/24/20  . isosorbide mononitrate (IMDUR) 30 MG 24 hr tablet Take 30 mg by mouth daily. 10/04/2020: Last filled 08/03/20  . Lancets (ONETOUCH ULTRASOFT) lancets USE TO TEST BLOOD SUGAR 1-2 TIMES DAILY   . lubiprostone ([name]) 8 MCG capsule TAKE 1 CAPSULE(8 MCG) BY MOUTH TWICE DAILY WITH A MEAL (Patient taking differently: Take 8 mcg by mouth 2 (two) times daily with a meal.) 10/04/2020: Lasft filled 08/03/20  . Multiple Vitamin (MULTIVITAMIN WITH MINERALS) TABS tablet Take 1 tablet by mouth daily.   . nitroGLYCERIN (NITROSTAT) 0.4 MG SL tablet DISSOLVE 1 TABLET UNDER THE TONGUE EVERY 5 MINUTES AS&nbsp;&nbsp;NEEDED FOR CHEST PAIN. MAX&nbsp;&nbsp;OF 3 TABLETS IN 15 MINUTES. CALL 911 IF PAIN PERSISTS. (Patient taking differently: Place 0.4 mg under the tongue every 5 (five) minutes as needed for chest pain.) 10/04/2020: Last filled 07/25/20  . pantoprazole (PROTONIX) 40 MG tablet TAKE ONE TABLET BY MOUTH EVERY MORNING (Patient taking differently: Take  40 mg by mouth daily.) 10/04/2020: Last filled 08/03/20  . predniSONE (DELTASONE) 10 MG tablet Take 1 tablet (10 mg total) by mouth daily. Take 40 mg x 1 day, 30 mg x 3 days, 20 mg x 3 days and then 10 mg daily- do not stop until your doctor tells you to.   . Roflumilast (DALIRESP) 250 MCG TABS Take 250 mcg by mouth daily.   . rosuvastatin (CRESTOR) 20 MG tablet Take 1 tablet (20 mg total) by mouth daily. 10/04/2020: Last filled 08/27/20   . traZODone (DESYREL) 50 MG tablet TAKE 1/2 TO 1 TABLET BY  MOUTH AT BEDTIME AS NEEDED  FOR SLEEP. (Patient taking differently: Take 25-50 mg by mouth at bedtime as needed for sleep.) 10/04/2020: No fill record in past 6 months   No facility-administered encounter medications on file as of 10/22/2020.    Current Diagnosis: Patient Active Problem List   Diagnosis Date Noted  . Cardiac arrest ([place]) 10/04/2020  . COPD with acute exacerbation ([place]) 09/23/2020  . Acute kidney injury superimposed on CKD ([place]) 09/23/2020  . Acute respiratory failure ([place]) 09/13/2020  . Thiamine deficiency 07/02/2020  . Acute respiratory distress 06/21/2020  . Chronic respiratory failure with hypoxia ([place]) 06/01/2020  . Obstructive sleep apnea 05/02/2020  . Type 2 diabetes mellitus with diabetic neuropathy, without long-term current use of insulin ([place]) 04/24/2020  . Hyperlipidemia   . Acute on chronic combined systolic and diastolic CHF (congestive heart failure) ([place]) 04/07/2020  . Chronic constipation 07/19/2019  . Hospital discharge follow-up 06/17/2019  . Shortness of breath 06/12/2019  . Chest tightness 06/12/2019  . Snoring 03/29/2019  . Insomnia due to other mental disorder 11/15/2018  . Anticoagulant long-term use 10/21/2018  . Gastroesophageal reflux disease without esophagitis 08/10/2018  . Tobacco abuse 08/10/2018  . Chest pain 07/27/2018  . PAF (paroxysmal atrial fibrillation) ([place]) 07/27/2018  . Mild renal insufficiency 07/27/2018  . Chronic combined systolic and diastolic heart failure ([place]) 07/27/2018  . Slow transit constipation 07/20/2018  . Essential hypertension 07/06/2018  . Chronic obstructive pulmonary disease ([place]) 07/06/2018  . Controlled type 2 diabetes mellitus without complication, without long-term current use of insulin ([place]) 07/06/2018  . Depression 07/06/2018  . Macrocytic anemia 07/06/2018  . Healthcare maintenance 07/06/2018  . Alcohol abuse 07/06/2018    Goals  Addressed   None     Follow-Up:  Pharmacist Review   Spoke with Patient , requesting refill for Fluoxetine 20 mg capsule, and carvedilol 12.5 mg tablet.Completed acute form and sent to clinical pharmacist for review.  [place] Pharmacist Assistant [phone]

## 2020-10-22 NOTE — Assessment & Plan Note (Addendum)
Wound wound recurrent COPD exacerbation-patient is prone to frequent flares and hospitalizations.  He is also been intubated twice in the last month.  Long discussion with patient and family member regarding goals of care. Daliresp has been added, patient assistance paperwork has been completed, will continue on DuoNeb nebulizers 3 times daily.  Along with triple therapy.  We will hold on low-dose steroids for now. Sputum culture did show Pseudomonas-has completed full course of antibiotics.  We will check chest x-ray today. Do believe patient would benefit from palliative care in the outpatient setting.  Continue with physical therapy  Plan  Patient Instructions  Continue on TRELEGY 1 puff daily, rinse after use.  Begin Daliresp daily  When available  Mucinex DM Twice daily  As needed  Cough/congestion  Begin Flutter valve Three times a day   Call back if you would like referral to Palliative.  Continue on Duoneb nebs Three times a day   Continue on Oxygen 2l/m  Taper prednisone to 10mg  daily and hold at this dose .  Continue on CPAP At bedtime  . -need to wear all night long and naps.  Use caution with sedating medications  Chest xray today .  Follow up with Dr. or Trevan Messman NP in 4 weeks  and As needed  Please contact office for sooner follow up if symptoms do not improve or worsen or seek emergency care

## 2020-10-22 NOTE — ED Provider Notes (Signed)
[facility] Emergency Department Provider Note MRN:  [id number]  Arrival date & time: 10/23/20     Chief Complaint   Respiratory Distress   History of Present Illness   Kenneth [name] is a 60 y.o. year-old male with a history of CHF, COPD, diabetes, A. fib, hypertension presenting to the ED with chief complaint of respiratory distress.  Acute respiratory distress beginning this evening at about 8 PM.  Worsened quickly, EMS called.  Found to be hypoxic and struggling for breath, started on BiPAP.  Given 4 nitro tablets in route.  Feeling much better with the BiPAP machine.  Explains that he was recently admitted to the hospital for similar issues.  Denies any recent fever or cough, no excessive leg swelling.  Denies any chest pain.  Review of Systems  A complete 10 system review of systems was obtained and all systems are negative except as noted in the HPI and PMH.   Patient's Health History    Past Medical History:  Diagnosis Date  . Anxiety   . Arthritis   . Atrial fibrillation (HCC)   . Chest tightness 06/12/2019  . CHF (congestive heart failure) (HCC)   . COPD (chronic obstructive pulmonary disease) (HCC)    emphysema  . Depression   . Diabetes mellitus without complication (HCC)   . Heart murmur   . Hyperlipidemia   . Hypertension   . Sleep apnea with use of continuous positive airway pressure (CPAP)     Past Surgical History:  Procedure Laterality Date  . NO PAST SURGERIES      Family History  Problem Relation Age of Onset  . Diabetes Mother   . Heart disease Mother   . Diabetes Father   . Heart disease Father   . Diabetes Sister   . Heart disease Sister   . Kidney disease Sister   . Coronary artery disease Brother   . Liver disease Maternal Uncle     Social History   Socioeconomic History  . Marital status: Divorced    Spouse name: Not on file  . Number of children: 1  . Years of education: Not on file  . Highest education level:  Not on file  Occupational History  . Occupation: disabled  Tobacco Use  . Smoking status: Former Smoker    Packs/day: 2.00    Years: 30.00    Pack years: 60.00    Types: Cigarettes    Quit date: 05/01/2019    Years since quitting: 1.4  . Smokeless tobacco: Never Used  . Tobacco comment: smokes 2-3cigs per day  Vaping Use  . Vaping Use: Never used  Substance and Sexual Activity  . Alcohol use: Yes    Comment: Drinks up to six beers around a game  . Drug use: Yes    Types: Marijuana    Comment: occ marijuana  . Sexual activity: Yes    Partners: Female  Other Topics Concern  . Not on file  Social History Narrative  . Not on file   Social Determinants of Health   Financial Resource Strain: Low Risk   . Difficulty of Paying Living Expenses: Not hard at all  Food Insecurity: No Food Insecurity  . Worried About Programme researcher, broadcasting/film/video in the Last Year: Never true  . Ran Out of Food in the Last Year: Never true  Transportation Needs: No Transportation Needs  . Lack of Transportation (Medical): No  . Lack of Transportation (Non-Medical): No  Physical Activity: Not on  file  Stress: Not on file  Social Connections: Not on file  Intimate Partner Violence: Not on file     Physical Exam   Vitals:   10/23/20 0300 10/23/20 0330  BP: (!) 158/79 (!) 151/89  Pulse: 74 80  Resp: 17 14  Temp:    SpO2: 100% 100%    CONSTITUTIONAL: Well-appearing, NAD NEURO:  Alert and oriented x 3, no focal deficits EYES:  eyes equal and reactive ENT/NECK:  no LAD, no JVD CARDIO: Regular rate, well-perfused, normal S1 and S2 PULM: Poor air movement, tolerating BiPAP well GI/GU:  normal bowel sounds, non-distended, non-tender MSK/SPINE:  No gross deformities, no edema SKIN:  no rash, atraumatic PSYCH:  Appropriate speech and behavior  *Additional and/or pertinent findings included in MDM below  Diagnostic and Interventional Summary    EKG Interpretation  Date/Time:  Monday October 22 2020  22:45:24 EST Ventricular Rate:  123 PR Interval:    QRS Duration: 75 QT Interval:  323 QTC Calculation: 462 R Axis:   65 Text Interpretation: Atrial fibrillation LVH with secondary repolarization abnormality Confirmed by Kennis Carina [phone]) on 10/22/2020 10:59:28 PM      Labs Reviewed  CBC - Abnormal; Notable for the following components:      Result Value   RBC 3.57 (*)    Hemoglobin 9.7 (*)    HCT 33.4 (*)    MCHC 29.0 (*)    All other components within normal limits  COMPREHENSIVE METABOLIC PANEL - Abnormal; Notable for the following components:   Chloride 90 (*)    Glucose, Bld 528 (*)    Creatinine, Ser 2.03 (*)    Total Protein 5.9 (*)    Albumin 3.4 (*)    AST 45 (*)    Alkaline Phosphatase 127 (*)    GFR, Estimated 37 (*)    Anion gap 16 (*)    All other components within normal limits  LACTIC ACID, PLASMA - Abnormal; Notable for the following components:   Lactic Acid, Venous 2.2 (*)    All other components within normal limits  COMPREHENSIVE METABOLIC PANEL - Abnormal; Notable for the following components:   Chloride 90 (*)    CO2 36 (*)    Glucose, Bld 404 (*)    Creatinine, Ser 1.84 (*)    Total Protein 6.2 (*)    Albumin 3.4 (*)    Alkaline Phosphatase 138 (*)    GFR, Estimated 42 (*)    All other components within normal limits  CBC - Abnormal; Notable for the following components:   WBC 11.6 (*)    RBC 3.33 (*)    Hemoglobin 9.2 (*)    HCT 31.6 (*)    MCHC 29.1 (*)    All other components within normal limits  I-STAT VENOUS BLOOD GAS, ED - Abnormal; Notable for the following components:   pCO2, Ven 64.2 (*)    pO2, Ven 149.0 (*)    Bicarbonate 40.1 (*)    TCO2 42 (*)    Acid-Base Excess 13.0 (*)    Sodium 134 (*)    Calcium, Ion 1.09 (*)    HCT 34.0 (*)    Hemoglobin 11.6 (*)    All other components within normal limits  CBG MONITORING, ED - Abnormal; Notable for the following components:   Glucose-Capillary 148 (*)    All other  components within normal limits  TROPONIN I (HIGH SENSITIVITY) - Abnormal; Notable for the following components:   Troponin I (High Sensitivity) 30 (*)  All other components within normal limits  TROPONIN I (HIGH SENSITIVITY) - Abnormal; Notable for the following components:   Troponin I (High Sensitivity) 43 (*)    All other components within normal limits  SARS CORONAVIRUS 2 BY RT PCR (HOSPITAL ORDER, PERFORMED IN [facility])  PROCALCITONIN  BRAIN NATRIURETIC PEPTIDE  PROCALCITONIN    DG Chest Port 1 View  Final Result      Medications  ipratropium-albuterol (DUONEB) 0.5-2.5 (3) MG/3ML nebulizer solution 3 mL (3 mLs Nebulization Not Given 10/23/20 0012)  carvedilol (COREG) tablet 12.5 mg (has no administration in time range)  isosorbide mononitrate (IMDUR) 24 hr tablet 30 mg (has no administration in time range)  nitroGLYCERIN (NITROSTAT) SL tablet 0.4 mg (has no administration in time range)  rosuvastatin (CRESTOR) tablet 20 mg (has no administration in time range)  ARIPiprazole (ABILIFY) tablet 5 mg (has no administration in time range)  busPIRone (BUSPAR) tablet 15 mg (15 mg Oral Given 10/23/20 0140)  FLUoxetine (PROZAC) capsule 60 mg (has no administration in time range)  pantoprazole (PROTONIX) EC tablet 40 mg (has no administration in time range)  lubiprostone ([name]) capsule 8 mcg (has no administration in time range)  apixaban (ELIQUIS) tablet 5 mg (has no administration in time range)  gabapentin (NEURONTIN) capsule 300 mg (300 mg Oral Given 10/23/20 0140)  roflumilast (DALIRESP) tablet 250 mcg (has no administration in time range)  albuterol (PROVENTIL) (2.5 MG/3ML) 0.083% nebulizer solution 2.5 mg (has no administration in time range)  predniSONE (DELTASONE) tablet 40 mg (has no administration in time range)  sodium chloride flush (NS) 0.9 % injection 3 mL (3 mLs Intravenous Given 10/23/20 0119)  acetaminophen (TYLENOL) tablet 650 mg (has no administration in  time range)    Or  acetaminophen (TYLENOL) suppository 650 mg (has no administration in time range)  fluticasone furoate-vilanterol (BREO ELLIPTA) 100-25 MCG/INH 1 puff (has no administration in time range)    And  umeclidinium bromide (INCRUSE ELLIPTA) 62.5 MCG/INH 1 puff (has no administration in time range)  insulin aspart (novoLOG) injection 0-20 Units (has no administration in time range)  ceFEPIme (MAXIPIME) 2 g in sodium chloride 0.9 % 100 mL IVPB (has no administration in time range)  0.9 %  sodium chloride infusion ( Intravenous New Bag/Given 10/23/20 0226)  methylPREDNISolone sodium succinate (SOLU-MEDROL) 125 mg/2 mL injection 125 mg (125 mg Intravenous Given 10/22/20 2306)  ipratropium-albuterol (DUONEB) 0.5-2.5 (3) MG/3ML nebulizer solution 3 mL (3 mLs Nebulization Given 10/23/20 0012)  insulin aspart (novoLOG) injection 10 Units (10 Units Intravenous Given 10/23/20 0101)  ceFEPIme (MAXIPIME) 2 g in sodium chloride 0.9 % 100 mL IVPB (0 g Intravenous Stopped 10/23/20 0139)     Procedures  /  Critical Care .Critical Care Performed by: Sabas Sous, MD Authorized by: Sabas Sous, MD   Critical care provider statement:    Critical care time (minutes):  45   Critical care was necessary to treat or prevent imminent or life-threatening deterioration of the following conditions:  Respiratory failure   Critical care was time spent personally by me on the following activities:  Discussions with consultants, evaluation of patient's response to treatment, examination of patient, ordering and performing treatments and interventions, ordering and review of laboratory studies, ordering and review of radiographic studies, pulse oximetry, re-evaluation of patient's condition, obtaining history from patient or surrogate and review of old charts    ED Course and Medical Decision Making  I have reviewed the triage vital signs, the nursing notes,  and pertinent available records from the  EMR.  Listed above are laboratory and imaging tests that I personally ordered, reviewed, and interpreted and then considered in my medical decision making (see below for details).  History of CHF and COPD, frequent admissions, was admitted earlier this month after cardiac arrest and return of spontaneous circulation, was intubated during this admission.  He is currently doing better on BiPAP, conversing with me.  Suspect will need repeated admission.       Elmer Sow. Pilar Plate, MD [facility] Emergency Medicine [facility] [email or web address]  Final Clinical Impressions(s) / ED Diagnoses     ICD-10-CM   1. COPD exacerbation (HCC)  J44.1     ED Discharge Orders    None       Discharge Instructions Discussed with and Provided to Patient:   Discharge Instructions   None       Sabas Sous, MD 10/23/20 [phone]

## 2020-10-22 NOTE — ED Triage Notes (Signed)
Pt arrived via ems from home due to respiratory distress. Pt was found by ems in a tripod position 80% on RA. Pt was placed on cpap o2 sats improved to 97%

## 2020-10-22 NOTE — Progress Notes (Signed)
@Patient  ID: Kenneth [name], male    DOB: [birth year], 60 y.o.   MRN: [id number]  Chief Complaint  Patient presents with  . Follow-up    COPD     Referring provider: [name]  HPI: 60 year old male former smoker followed for very severe COPD with asthma-prone to recurrent exacerbations, obstructive sleep apnea on nocturnal CPAP Medical history significant for A. fib on chronic anticoagulation, diabetes, hypertension, congestive heart failure, nonischemic cardiomyopathy  TEST/EVENTS :  Pulmonary function testing May 02, 2020 showed FEV1 at 25%, ratio 42, FVC 48%, no significant bronchodilator response., Severe mid flow obstruction with reversibility, DLCO 50%.  2D echo April 07, 2020 EF 45 to 50%, normal pulmonary artery systolic pressure  2D echo October 04, 2020 EF 55 to 40%, RV systolic function is normal   10/22/2020 Follow up : COPD with Asthma , OSA  patient returns for a follow-up visit.  Patient has underlying COPD with asthma prone to recurrent exacerbations.  He has been hospitalized multiple times over the last month.  He has been intubated x2 during those admissions for acute on chronic respiratory failure, decompensated heart failure and COPD. Most recent admission patient suffered a out of hospital PEA arrest, ROSC was achieved after 5 minutes of CPR and 1 round of epi.  Patient had had progressive shortness of breath prior to collapse.  He went to a local fire department where symptoms worsen and he had a witnessed arrest.  During hospitalization he was treated with IV antibiotics steroids and nebulized bronchodilators.  Sputum culture was abundant pansensitive Pseudomonas.  Started on Daliresp 250 mcg daily at discharge.  And started on a slow Prednisone taper.  Was not able to start Daliresp due to insurance coverage . Patient assistance paperwork completed.  Patient is on multiple medications for anxiety including BuSpar, Neurontin ,  Prozac and  trazodone. Discussed using sedating meds with caution .  Patient does have sleep apnea.  CPAP download shows mixed usage.  Some of this was during hospitalizations.  We discussed the importance of CPAP usage.  CPAP download shows 50% compliance, AHI 0.1.  Daily average usage at 6 hours.  Patient is on auto CPAP 13 to 20 cm H2O.[name] He says he wears it everyday he is home and if he naps.   Since discharge starting to feel only minimally better, hard to cough up mucus . No increased O2 demands. O2 sats good on 2l/m .   PT at home. Nursing will be starting next week to help 2 x month .   Long discussion with patient and family member regarding code status , goals of care . Went over Palliative care and hospice in the home setting . They would like to think about this and get back to me regarding referral to Palliative care .       Allergies  Allergen Reactions  . Lisinopril Swelling  . Other Other (See Comments)    Lettuce : rash  . Tomato Rash    Immunization History  Administered Date(s) Administered  . Influenza,inj,Quad PF,6+ Mos 06/25/2018, 05/20/2019  . Influenza-Unspecified 06/28/2020  . PFIZER(Purple Top)SARS-COV-2 Vaccination 12/22/2019, 01/16/2020  . Pneumococcal Polysaccharide-23 06/08/2016  . Zoster Recombinat (Shingrix) 07/22/2019    Past Medical History:  Diagnosis Date  . Anxiety   . Arthritis   . Atrial fibrillation ([place])   . CHF (congestive heart failure) ([place])   . COPD (chronic obstructive pulmonary disease) (HCC)    emphysema  . Depression   . Diabetes  mellitus without complication ([place])   . Heart murmur   . Hyperlipidemia   . Hypertension   . Sleep apnea with use of continuous positive airway pressure (CPAP)     Tobacco History: Social History   Tobacco Use  Smoking Status Former Smoker  . Packs/day: 2.00  . Years: 30.00  . Pack years: 60.00  . Types: Cigarettes  . Quit date: 05/01/2019  . Years since quitting: 1.4  Smokeless Tobacco Never Used   Tobacco Comment   smokes 2-3cigs per day   Counseling given: Not Answered Comment: smokes 2-3cigs per day   Outpatient Medications Prior to Visit  Medication Sig Dispense Refill  . acetaminophen (TYLENOL) 325 MG tablet Take 650 mg by mouth every 6 (six) hours as needed for mild pain or headache.    . albuterol (PROVENTIL) (2.5 MG/3ML) 0.083% nebulizer solution INHALE 1 VIAL VIA NEBULIZER 5 TIMES DAILY AS NEEDED FOR WHEEZING OR FOR SHORTNESS OF BREATH 150 mL 1  . ARIPiprazole (ABILIFY) 5 MG tablet Take 5 mg by mouth daily.    . Blood Glucose Monitoring Suppl (ONE TOUCH ULTRA 2) w/Device KIT Use to test blood sugars 1-2 times daily. 1 kit 0  . busPIRone (BUSPAR) 15 MG tablet Take 1 tablet (15 mg total) by mouth 2 (two) times daily. 180 tablet 1  . carvedilol (COREG) 12.5 MG tablet Take 1 tablet (12.5 mg total) by mouth 2 (two) times daily with a meal. 180 tablet 3  . DM-GG & DM-APAP-CPM (CORICIDIN HBP DAY/NIGHT COLD) 10-20 &15-200-2 MG MISC Take 1 tablet by mouth every 12 (twelve) hours as needed (cold symptoms).    [name] ELIQUIS 5 MG TABS tablet TAKE ONE TABLET BY MOUTH EVERY MORNING and TAKE ONE TABLET BY MOUTH EVERY EVENING 180 tablet 1  . famotidine (PEPCID) 20 MG tablet Take 20 mg by mouth daily.    [name] FLUoxetine (PROZAC) 20 MG capsule TAKE 3 CAPSULES BY MOUTH  DAILY (Patient taking differently: Take 60 mg by mouth daily.) 270 capsule 3  . Fluticasone-Umeclidin-Vilant (TRELEGY ELLIPTA) 100-62.5-25 MCG/INH AEPB Inhale 1 puff into the lungs daily. 60 each 5  . furosemide (LASIX) 40 MG tablet Take 1 tablet (40 mg total) by mouth 2 (two) times daily. 60 tablet 11  . gabapentin (NEURONTIN) 300 MG capsule TAKE ONE CAPSULE BY MOUTH THREE TIMES DAILY (Patient taking differently: Take 300 mg by mouth 3 (three) times daily.) 90 capsule 2  . glucose blood (ONETOUCH ULTRA) test strip USE TO TEST BLOOD SUGAR 1-2 TIMES DAILY 100 each 11  . guaiFENesin (MUCINEX) 600 MG 12 hr tablet Take 1 tablet (600 mg  total) by mouth 2 (two) times daily. 30 tablet 0  . ipratropium-albuterol (DUONEB) 0.5-2.5 (3) MG/3ML SOLN Take 3 mLs by nebulization every 6 (six) hours as needed. 360 mL 0  . isosorbide mononitrate (IMDUR) 30 MG 24 hr tablet Take 30 mg by mouth daily.    . Lancets (ONETOUCH ULTRASOFT) lancets USE TO TEST BLOOD SUGAR 1-2 TIMES DAILY 100 each 11  . lubiprostone ([name]) 8 MCG capsule TAKE 1 CAPSULE(8 MCG) BY MOUTH TWICE DAILY WITH A MEAL (Patient taking differently: Take 8 mcg by mouth 2 (two) times daily with a meal.) 60 capsule 8  . Multiple Vitamin (MULTIVITAMIN WITH MINERALS) TABS tablet Take 1 tablet by mouth daily.    . nitroGLYCERIN (NITROSTAT) 0.4 MG SL tablet DISSOLVE 1 TABLET UNDER THE TONGUE EVERY 5 MINUTES AS  NEEDED FOR CHEST PAIN. MAX  OF 3 TABLETS IN 15  MINUTES. CALL 911 IF PAIN PERSISTS. (Patient taking differently: Place 0.4 mg under the tongue every 5 (five) minutes as needed for chest pain.) 75 tablet 4  . pantoprazole (PROTONIX) 40 MG tablet TAKE ONE TABLET BY MOUTH EVERY MORNING (Patient taking differently: Take 40 mg by mouth daily.) 30 tablet 4  . predniSONE (DELTASONE) 10 MG tablet Take 1 tablet (10 mg total) by mouth daily. Take 40 mg x 1 day, 30 mg x 3 days, 20 mg x 3 days and then 10 mg daily- do not stop until your doctor tells you to. 50 tablet 1  . Roflumilast (DALIRESP) 250 MCG TABS Take 250 mcg by mouth daily. 28 tablet 1  . rosuvastatin (CRESTOR) 20 MG tablet Take 1 tablet (20 mg total) by mouth daily. 30 tablet 3  . traZODone (DESYREL) 50 MG tablet TAKE 1/2 TO 1 TABLET BY  MOUTH AT BEDTIME AS NEEDED  FOR SLEEP. (Patient taking differently: Take 25-50 mg by mouth at bedtime as needed for sleep.) 30 tablet 11   No facility-administered medications prior to visit.     Review of Systems:   Constitutional:   No  weight loss, night sweats,  Fevers, chills, + fatigue, or  lassitude.  HEENT:   No headaches,  Difficulty swallowing,  Tooth/dental problems, or  Sore  throat,                No sneezing, itching, ear ache,  +nasal congestion, post nasal drip,   CV:  No chest pain,  Orthopnea, PND, swelling in lower extremities, anasarca, dizziness, palpitations, syncope.   GI  No heartburn, indigestion, abdominal pain, nausea, vomiting, diarrhea, change in bowel habits, loss of appetite, bloody stools.   Resp:  .  No chest wall deformity  Skin: no rash or lesions.  GU: no dysuria, change in color of urine, no urgency or frequency.  No flank pain, no hematuria   MS:  No joint pain or swelling.  No decreased range of motion.  No back pain.    Physical Exam  BP 128/82   Pulse 78   Temp (!) 97.3 F (36.3 C) (Temporal)   Ht 6' (1.829 m)   Wt 193 lb 9.6 oz (87.8 kg)   SpO2 100% Comment: on 2L  BMI 26.26 kg/m   GEN: A/Ox3; pleasant , NAD, chronically ill appearing, on o2    HEENT:  [place]/AT,  NOSE-clear, THROAT-clear, no lesions, no postnasal drip or exudate noted.   NECK:  Supple w/ fair ROM; no JVD; normal carotid impulses w/o bruits; no thyromegaly or nodules palpated; no lymphadenopathy.    RESP  Clear  P & A; w/o, wheezes/ rales/ or rhonchi. no accessory muscle use, no dullness to percussion  CARD:  RRR, no m/r/g, tr  peripheral edema, pulses intact, no cyanosis or clubbing.  GI:   Soft & nt; nml bowel sounds; no organomegaly or masses detected.   Musco: Warm bil, no deformities or joint swelling noted.   Neuro: alert, no focal deficits noted.    Skin: Warm, no lesions or rashes    Lab Results:  CBC    BNP  ProBNP No results found for: PROBNP  Imaging: DG Chest 1 View  Result Date: 10/09/2020 CLINICAL DATA:  Extubation. EXAM: CHEST  1 VIEW COMPARISON:  10/06/2020. FINDINGS: Mediastinum and hilar structures normal. Stable cardiomegaly. Low lung volumes with bibasilar atelectasis/infiltrates, improved from prior exam. Small left pleural effusion. Right costophrenic angle incompletely imaged. No pneumothorax. IMPRESSION: 1.  Low lung  volumes with bibasilar atelectasis/infiltrates, improved from prior exam. Small left pleural effusion. 2.  Stable cardiomegaly. Electronically Signed   By: Marcello Moores  Register   On: 10/09/2020 06:37   DG Abdomen 1 View  Result Date: 10/03/2020 CLINICAL DATA:  60 year old male status post intubation. EXAM: ABDOMEN - 1 VIEW; PORTABLE CHEST - 1 VIEW COMPARISON:  Chest CT dated 09/23/2020. FINDINGS: Endotracheal tube with tip approximately 6 cm above the carina. Enteric tube extends below the diaphragm with tip and side-port in the proximal stomach. Faint bilateral perihilar streaky densities, likely atelectasis. No focal consolidation, pleural effusion or pneumothorax. The cardiac silhouette is within limits. No acute osseous pathology. IMPRESSION: 1. Endotracheal tube above the carina. 2. Enteric tube with tip and side-port in the proximal stomach. Electronically Signed   By: Anner Crete M.D.   On: 10/03/2020 20:57   CT Head Wo Contrast  Result Date: 10/03/2020 CLINICAL DATA:  Altered mental status status post cardiac arrest EXAM: CT HEAD WITHOUT CONTRAST TECHNIQUE: Contiguous axial images were obtained from the base of the skull through the vertex without intravenous contrast. COMPARISON:  Head CT September 13, 2020. FINDINGS: Brain: No evidence of acute infarction, hemorrhage, hydrocephalus, extra-axial collection or mass lesion/mass effect. Vascular: No hyperdense vessel or unexpected calcification. Skull: Normal. Negative for fracture or focal lesion. Sinuses/Orbits: No acute finding. Other: None. IMPRESSION: No acute intracranial abnormality. Electronically Signed   By: Dahlia Bailiff MD   On: 10/03/2020 21:34   CT Chest Wo Contrast  Result Date: 09/23/2020 CLINICAL DATA:  COPD exacerbation, shortness of breath EXAM: CT CHEST WITHOUT CONTRAST TECHNIQUE: Multidetector CT imaging of the chest was performed following the standard protocol without IV contrast. COMPARISON:  03/03/2020 FINDINGS:  Cardiovascular: Aortic atherosclerosis. Heart is normal size. Aorta is normal caliber. Mediastinum/Nodes: No mediastinal, hilar, or axillary adenopathy. Trachea and esophagus are unremarkable. Thyroid unremarkable. Lungs/Pleura: Centrilobular and paraseptal emphysema. Peripheral platelike scarring or atelectasis in the left lower lobe and lingula. Linear scarring at the right base in both the right middle lobe and right lower lobe. No effusions. No confluent airspace opacities. Upper Abdomen: Imaging into the upper abdomen demonstrates no acute findings. Musculoskeletal: Chest wall soft tissues are unremarkable. No acute bony abnormality. IMPRESSION: Moderate centrilobular and paraseptal emphysema. Linear bibasilar scarring or atelectasis.  No confluent opacities. Aortic Atherosclerosis (ICD10-I70.0) and Emphysema (ICD10-J43.9). Electronically Signed   By: Rolm Baptise M.D.   On: 09/23/2020 11:57   CT Angio Chest PE W and/or Wo Contrast  Result Date: 10/03/2020 CLINICAL DATA:  Chest pain, shortness of breath, post arrest EXAM: CT ANGIOGRAPHY CHEST WITH CONTRAST TECHNIQUE: Multidetector CT imaging of the chest was performed using the standard protocol during bolus administration of intravenous contrast. Multiplanar CT image reconstructions and MIPs were obtained to evaluate the vascular anatomy. CONTRAST:  15m OMNIPAQUE IOHEXOL 350 MG/ML SOLN COMPARISON:  09/23/2020 FINDINGS: Cardiovascular: No filling defects in the pulmonary arteries to suggest pulmonary emboli. Heart is normal size. Aorta is normal caliber. Mediastinum/Nodes: No mediastinal, hilar, or axillary adenopathy. Endotracheal tube tip in the distal trachea. Thyroid and esophagus unremarkable. Lungs/Pleura: Moderate emphysema. No confluent opacities or effusions. Upper Abdomen: Imaging into the upper abdomen demonstrates no acute findings. NG tube in the stomach. Reflux of contrast into the IVC and hepatic veins suggesting right heart dysfunction.  Musculoskeletal: Chest wall soft tissues are unremarkable. No acute bony abnormality. Review of the MIP images confirms the above findings. IMPRESSION: No evidence of pulmonary embolus. Reflux of contrast into the IVC and hepatic veins compatible  with right heart dysfunction. Endotracheal tube and NG tube in expected position. Aortic Atherosclerosis (ICD10-I70.0) and Emphysema (ICD10-J43.9). Electronically Signed   By: Rolm Baptise M.D.   On: 10/03/2020 21:30   DG CHEST PORT 1 VIEW  Result Date: 10/10/2020 CLINICAL DATA:  Shortness of breath.  Pleural effusion. EXAM: PORTABLE CHEST 1 VIEW COMPARISON:  10/09/2020. FINDINGS: Mediastinum and hilar structures normal. Cardiomegaly. Mild pulmonary venous congestion. Low lung volumes persistent bibasilar atelectasis/infiltrates. No pleural effusion or pneumothorax. Left costophrenic incompletely imaged. Thoracic spine scoliosis. IMPRESSION: 1. Cardiomegaly with mild pulmonary venous congestion. 2. Low lung volumes with persistent bibasilar atelectasis/infiltrates. Electronically Signed   By: Marcello Moores  Register   On: 10/10/2020 07:59   DG CHEST PORT 1 VIEW  Result Date: 10/06/2020 CLINICAL DATA:  Respiratory arrest. EXAM: PORTABLE CHEST 1 VIEW COMPARISON:  10/05/2020 FINDINGS: Lungs are adequately inflated with persistent hazy patchy density over the mid to lower lungs likely due to infection and less likely asymmetric edema or atelectasis. Cardiomediastinal silhouette and remainder of the exam is unchanged. IMPRESSION: Persistent hazy patchy density over the mid to lower lungs likely infection and less likely asymmetric edema or atelectasis. Electronically Signed   By: Marin Olp M.D.   On: 10/06/2020 09:21   DG Chest Port 1 View  Result Date: 10/05/2020 CLINICAL DATA:  Hypoxia EXAM: PORTABLE CHEST 1 VIEW COMPARISON:  October 03, 2020 chest radiograph and chest CT FINDINGS: Endotracheal tube tip is 5.2 cm above the carina. Nasogastric tube tip and side port  are below the diaphragm. No pneumothorax. There is airspace opacity in the right base, new from recent studies. There is mild left base atelectasis. Heart is upper normal in size with pulmonary vascularity normal. No adenopathy. There is aortic atherosclerosis. No bone lesions. IMPRESSION: Tube and catheter positions as described without pneumothorax. Airspace opacity right lower lobe, consistent with pneumonia or aspiration. Both of these entities may be present concurrently. There is mild left base atelectasis. Heart upper normal in size. Aortic Atherosclerosis (ICD10-I70.0). Electronically Signed   By: Lowella Grip III M.D.   On: 10/05/2020 08:22   DG Chest Portable 1 View  Result Date: 10/03/2020 CLINICAL DATA:  60 year old male status post intubation. EXAM: ABDOMEN - 1 VIEW; PORTABLE CHEST - 1 VIEW COMPARISON:  Chest CT dated 09/23/2020. FINDINGS: Endotracheal tube with tip approximately 6 cm above the carina. Enteric tube extends below the diaphragm with tip and side-port in the proximal stomach. Faint bilateral perihilar streaky densities, likely atelectasis. No focal consolidation, pleural effusion or pneumothorax. The cardiac silhouette is within limits. No acute osseous pathology. IMPRESSION: 1. Endotracheal tube above the carina. 2. Enteric tube with tip and side-port in the proximal stomach. Electronically Signed   By: Anner Crete M.D.   On: 10/03/2020 20:57   DG Chest Port 1 View  Result Date: 09/23/2020 CLINICAL DATA:  Dyspnea EXAM: PORTABLE CHEST 1 VIEW COMPARISON:  09/14/2020 chest radiograph. FINDINGS: Stable cardiomediastinal silhouette with normal heart size. No pneumothorax. No pleural effusion. No pulmonary edema. Faint hazy bibasilar lung opacities, similar to prior radiograph. IMPRESSION: Faint hazy bibasilar lung opacities, similar to prior radiograph, more suggestive of atelectasis or nonspecific fibrosis. Electronically Signed   By: Ilona Sorrel M.D.   On: 09/23/2020 07:18    ECHOCARDIOGRAM COMPLETE  Result Date: 10/04/2020    ECHOCARDIOGRAM REPORT   Patient Name:   JIMY GATES Date of Exam: 10/04/2020 Medical Rec #:  [id number]    Height:       72.0 in Accession #:  [id number]   Weight:       203.7 lb Date of Birth:  1961-07-23   BSA:          2.147 m Patient Age:    85 years     BP:           109/47 mmHg Patient Gender: M            HR:           64 bpm. Exam Location:  Inpatient Procedure: 2D Echo, Color Doppler, Cardiac Doppler and Strain Analysis Indications:    Cardiac arrest ([place]) [427.5.ICD-9-CM]  History:        Patient has prior history of Echocardiogram examinations, most                 recent 09/13/2020. COPD; Risk Factors:Diabetes, Hypertension,                 Sleep Apnea, Dyslipidemia and Current Smoker. Hronic kidney                 disease. GERD. History of Afib.  Sonographer:    Darlina Sicilian RDCS Referring Phys: Gamaliel  1. Left ventricular ejection fraction, by estimation, is 55 to 60%. The left ventricle has normal function. The left ventricle has no regional wall motion abnormalities. There is mild left ventricular hypertrophy. Left ventricular diastolic parameters are indeterminate.  2. Right ventricular systolic function is normal. The right ventricular size is normal. Tricuspid regurgitation signal is inadequate for assessing PA pressure.  3. A small pericardial effusion is present.  4. The mitral valve is normal in structure. No evidence of mitral valve regurgitation.  5. The aortic valve was not well visualized. Aortic valve regurgitation is mild. Mild to moderate aortic valve stenosis. Vmax 2.8 m/s, MG 54mHg, AVA 1.6 cm^2, DI 0.4 FINDINGS  Left Ventricle: Left ventricular ejection fraction, by estimation, is 55 to 60%. The left ventricle has normal function. The left ventricle has no regional wall motion abnormalities. The left ventricular internal cavity size was normal in size. There is  mild left ventricular hypertrophy.  Left ventricular diastolic parameters are indeterminate. Right Ventricle: The right ventricular size is normal. No increase in right ventricular wall thickness. Right ventricular systolic function is normal. Tricuspid regurgitation signal is inadequate for assessing PA pressure. Left Atrium: Left atrial size was normal in size. Right Atrium: Right atrial size was normal in size. Pericardium: A small pericardial effusion is present. Mitral Valve: The mitral valve is normal in structure. No evidence of mitral valve regurgitation. Tricuspid Valve: The tricuspid valve is normal in structure. Tricuspid valve regurgitation is trivial. Aortic Valve: The aortic valve was not well visualized. Aortic valve regurgitation is mild. Aortic regurgitation PHT measures 521 msec. Mild to moderate aortic stenosis is present. Aortic valve mean gradient measures 15.4 mmHg. Aortic valve peak gradient  measures 27.7 mmHg. Aortic valve area, by VTI measures 1.83 cm. Pulmonic Valve: The pulmonic valve was not well visualized. Pulmonic valve regurgitation is not visualized. Aorta: The aortic root is normal in size and structure. IAS/Shunts: The interatrial septum was not well visualized.  LEFT VENTRICLE PLAX 2D LVIDd:         5.50 cm  Diastology LVIDs:         4.50 cm  LV e' medial:    5.98 cm/s LV PW:         1.00 cm  LV E/e' medial:  11.4 LV IVS:  1.10 cm  LV e' lateral:   4.79 cm/s LVOT diam:     2.30 cm  LV E/e' lateral: 14.2 LV SV:         85 LV SV Index:   40 LVOT Area:     4.15 cm  RIGHT VENTRICLE RV S prime:     15.40 cm/s TAPSE (M-mode): 2.4 cm LEFT ATRIUM             Index       RIGHT ATRIUM          Index LA diam:        3.40 cm 1.58 cm/m  RA Area:     9.68 cm LA Vol (A2C):   68.2 ml 31.76 ml/m RA Volume:   16.90 ml 7.87 ml/m LA Vol (A4C):   72.7 ml 33.86 ml/m LA Biplane Vol: 71.4 ml 33.25 ml/m  AORTIC VALVE AV Area (Vmax):    1.97 cm AV Area (Vmean):   1.76 cm AV Area (VTI):     1.83 cm AV Vmax:           263.20  cm/s AV Vmean:          186.000 cm/s AV VTI:            0.465 m AV Peak Grad:      27.7 mmHg AV Mean Grad:      15.4 mmHg LVOT Vmax:         125.00 cm/s LVOT Vmean:        79.000 cm/s LVOT VTI:          0.205 m LVOT/AV VTI ratio: 0.44 AI PHT:            521 msec  AORTA Ao Root diam: 3.40 cm MITRAL VALVE MV Area (PHT): 3.77 cm    SHUNTS MV Decel Time: 201 msec    Systemic VTI:  0.20 m MV E velocity: 68.10 cm/s  Systemic Diam: 2.30 cm MV A velocity: 69.80 cm/s MV E/A ratio:  0.98 Oswaldo Milian MD Electronically signed by Oswaldo Milian MD Signature Date/Time: 10/04/2020/5:46:31 PM    Final    [place] EKG SITE RITE  Result Date: 10/04/2020 If Site Rite image not attached, placement could not be confirmed due to current cardiac rhythm.     PFT Results Latest Ref Rng & Units 05/02/2020  FVC-Pre L 1.90  FVC-Predicted Pre % 45  FVC-Post L 2.06  FVC-Predicted Post % 48  Pre FEV1/FVC % % 43  Post FEV1/FCV % % 42  FEV1-Pre L 0.81  FEV1-Predicted Pre % 24  FEV1-Post L 0.86  DLCO uncorrected ml/min/mmHg 14.61  DLCO UNC% % 50  DLCO corrected ml/min/mmHg 14.61  DLCO COR %Predicted % 50  DLVA Predicted % 72  TLC L 7.93  TLC % Predicted % 110  RV % Predicted % 220    No results found for: NITRICOXIDE      Assessment & Plan:   COPD with acute exacerbation (HCC) Wound wound recurrent COPD exacerbation-patient is prone to frequent flares and hospitalizations.  He is also been intubated twice in the last month.  Long discussion with patient and family member regarding goals of care. Daliresp has been added, patient assistance paperwork has been completed, will continue on DuoNeb nebulizers 3 times daily.  Along with triple therapy.  We will hold on low-dose steroids for now. Do believe patient would benefit from palliative care in the outpatient setting.  Continue with physical therapy  Plan  Patient  Instructions  Continue on TRELEGY 1 puff daily, rinse after use.  Begin Daliresp  daily  When available  Mucinex DM Twice daily  As needed  Cough/congestion  Begin Flutter valve Three times a day   Call back if you would like referral to Palliative.  Continue on Duoneb nebs Three times a day   Continue on Oxygen 2l/m  Taper prednisone to 5m daily and hold at this dose .  Continue on CPAP At bedtime  . -need to wear all night long and naps.  Use caution with sedating medications  Chest xray today .  Follow up with Dr. BLamonte Sakaior Okey Zelek NP in 4 weeks  and As needed  Please contact office for sooner follow up if symptoms do not improve or worsen or seek emergency care         Chronic respiratory failure with hypoxia ([place] Continue on oxygen therapy.  No increased demands on O2.  Patient has had 2 acute on chronic respiratory failures and a PEA arrest.  For now patient will continue on CPAP as he has this in the home.  Could consider a noninvasive bilevel support to help with recurrent exacerbations.  Plan  Patient Instructions  Continue on TRELEGY 1 puff daily, rinse after use.  Begin Daliresp daily  When available  Mucinex DM Twice daily  As needed  Cough/congestion  Begin Flutter valve Three times a day   Call back if you would like referral to Palliative.  Continue on Duoneb nebs Three times a day   Continue on Oxygen 2l/m  Taper prednisone to 162mdaily and hold at this dose .  Continue on CPAP At bedtime  . -need to wear all night long and naps.  Use caution with sedating medications  Chest xray today .  Follow up with Dr. ByLamonte Sakair [place] Bloor NP in 4 weeks  and As needed  Please contact office for sooner follow up if symptoms do not improve or worsen or seek emergency care       Acute on chronic combined systolic and diastolic CHF (congestive heart failure) (HCElkridgeRecent exacerbation requiring diuresis.  Patient appears euvolemic on exam.  Continue follow-up with cardiology.  [facility] witnessed PEA arrest with ROSC after 5 minutes with  CPR and epi. Goals of care reviewed with patient and family member.  Recommend palliative care referral.  Patient and family want to think about this and get back to our office.     TaRexene EdisonNP 10/22/2020

## 2020-10-22 NOTE — Patient Outreach (Signed)
[facility]) Care Management  10/22/2020  Kenneth [name] 1960/12/30 [id number]   Incoming call received from member, state she is having trouble getting his medications.  State he need refills on Prozac and Coreg, still has not obtained Daliresp inhaler.  Noted per chart that member wasn't able to afford Daliresp, state he has completed and returned patient assistance paperwork.  He will contact MD office to inquire about progress of application status.  He has follow up appointment today with pulmonology, will discuss during visit.  Advised to contact pharmacy to get refills on Coreg and Prozac, report taking all other medications as instructed.  Denies any shortness of breath at this time, will continue to use other inhalers and nebulizers as instructed.  Will follow up within the next week as planned, if active with [facility] at that time, will close case.  Denies any urgent concerns, encouraged to contact this care manager with questions.  Agrees to follow up within the next week.  Goals Addressed            This Visit's Progress   . [facility] - Track and Manage Fluids and Swelling-Heart Failure   On track    Timeframe:  Short-Term Goal Priority:  Medium Start Date:     10/16/2019                        Expected End Date:    11/15/2020                   Follow Up Date 10/29/2020   - track weight in diary - use salt in moderation - watch for swelling in feet, ankles and legs every day - weigh myself daily    Why is this important?    It is important to check your weight daily and watch how much salt and liquids you have.   It will help you to manage your heart failure.    Notes:     . [facility] - Track and Manage My Symptoms   On track    Timeframe:  Short-Term Goal Priority:  Medium Start Date:     10/16/2019                        Expected End Date:    2/242022                     - develop a rescue plan - keep follow-up appointments    Why is this important?    Tracking your symptoms and other information about your health helps your doctor plan your care.  Write down the symptoms, the time of day, what you were doing and what medicine you are taking.  You will soon learn how to manage your symptoms.     Notes:   11/10 - reminded of appointments next week  12/14 - Reviewed upcoming appointments with Cardiology and pulmonology  1/31 - Referral was placed to [facility] for home based palliative care, awaiting notification regarding engagement    . [facility] - Track and Manage My Triggers   On track    Timeframe:  Long-Range Goal Priority:  High Start Date:        09/04/2020                     Expected End Date:         11/05/2020                -  eliminate smoking in my home - identify and remove indoor air pollutants - listen for public air quality announcements every day    Why is this important?   Triggers are activities or things, like tobacco smoke or cold weather, that make your COPD (chronic obstructive pulmonary disease) flare-up.  Knowing these triggers helps you plan how to stay away from them.  When you cannot remove them, you can learn how to manage them.     Notes:   11/10 - Reviewed use of inhalers and oxygen  12/14 - Reviewed COPD action plan, encouraged to contact provider office with concerns      [name], RN, MSN [facility] Care Management  [facility] Manager [phone]

## 2020-10-22 NOTE — Assessment & Plan Note (Signed)
Recent exacerbation requiring diuresis.  Patient appears euvolemic on exam.  Continue follow-up with cardiology.

## 2020-10-22 NOTE — Addendum Note (Signed)
Addended by: Maurene Capes on: 10/22/2020 11:05 AM   Modules accepted: Orders

## 2020-10-22 NOTE — Assessment & Plan Note (Signed)
Recent witnessed PEA arrest with ROSC after 5 minutes with CPR and epi. Goals of care reviewed with patient and family member.  Recommend palliative care referral.  Patient and family want to think about this and get back to our office.

## 2020-10-22 NOTE — Chronic Care Management (AMB) (Deleted)
Chronic Care Management Pharmacy  Name: Kenneth [name]  MRN: [id number] DOB: 1961-08-02   Chief Complaint/ HPI  [name],  60 y.o. , male presents for their Follow-Up CCM visit with the clinical pharmacist In office.  PCP : [name], MD Patient Care Team: [name], MD as PCP - General (Family Medicine) Buford Dresser, MD as PCP - Cardiology (Cardiology) Valente David, RN as [place], Glean Salvo, [facility] as Pharmacist (Pharmacist) Madison Hickman, NP as Nurse Practitioner (Psychiatry)  Their chronic conditions include: Hypertension, Hyperlipidemia, Diabetes, Atrial Fibrillation, Heart Failure, GERD, COPD, Depression and Tobacco use   Office Visits: 04/24/20: Patient presented to Dr. Ethelene Hal for hospital follow-up. Patient started on Gabapentin for neuropathy. BP in clinic 150/78, patient started on losartan 50 mg daily.  03/22/20: Patient presented to [name], NP for constipation. Patient does not have adequate fiber or water intake. Patient started on Lubiprostone 8 mcg twice daily.   Consult Visit: 10/22/20: Patient presented to Rexene Edison, NP (Pulmonology) for Hospital Follow-up. Patient to continue Prednisone 10 mg daily.  1/12-1/22: Patient Hospitalized for Cardiac Arrest. Patient discharged on Daliresp 250 mcg daily and Prednisone with slower taper.   07/12/20: Patient presented to Dr. Royston Cowper (Pulmonology) for follow-up. Patient with dry wheezing, but no evidence of exacerbation. 07/05/20: Patient presented to Dr. Harrell Gave (Cardiology) for follow-up. BP in clinic 130/70. Continue losartan 50 mg daily, Imdur decreased to 30 mg daily.  06/01/20: Patient presented to Rexene Edison, NP (Pulmonology) for COPD follow-up. Patient still with symptoms of exacerbation. Doxycycline and prednisone course extended.  05/24/20: Patient presented to Rexene Edison, NP (Pulmonology) for COPD follow-up. Patient started  on Doxycycline and Prednisone.  05/04/20: Patient presented to Dr. Harrell Gave (Cardiology) for follow-up. Euvolemic, EF improved. Discussed Ferne Coe with patient.  05/02/20: Patient presented to Dr. Lamonte Sakai (Pulmonology) for COPD with acute exacerbation. Patient wondering if CPAP with oxygen would help him.  7/23-7/26/21: Patient hospitalized for COPD exacerbation 7/16-7/18/21: Patient hospitalized for COPD exacerbation   Allergies  Allergen Reactions  . Lisinopril Swelling  . Other Other (See Comments)    Lettuce : rash  . Tomato Rash    Medications: Outpatient Encounter Medications as of 10/22/2020  Medication Sig Note  . acetaminophen (TYLENOL) 325 MG tablet Take 650 mg by mouth every 6 (six) hours as needed for mild pain or headache.   . albuterol (PROVENTIL) (2.5 MG/3ML) 0.083% nebulizer solution INHALE 1 VIAL VIA NEBULIZER 5 TIMES DAILY AS NEEDED FOR WHEEZING OR FOR SHORTNESS OF BREATH   . ARIPiprazole (ABILIFY) 5 MG tablet Take 5 mg by mouth daily. 10/04/2020: Last filled 08/24/20  . Blood Glucose Monitoring Suppl (ONE TOUCH ULTRA 2) w/Device KIT Use to test blood sugars 1-2 times daily.   . busPIRone (BUSPAR) 15 MG tablet Take 1 tablet (15 mg total) by mouth 2 (two) times daily. 10/04/2020: Last filled 07/27/20  . carvedilol (COREG) 12.5 MG tablet Take 1 tablet (12.5 mg total) by mouth 2 (two) times daily with a meal. 10/04/2020: Last filled 08/03/20  . DM-GG & DM-APAP-CPM (CORICIDIN HBP DAY/NIGHT COLD) 10-20 &15-200-2 MG MISC Take 1 tablet by mouth every 12 (twelve) hours as needed (cold symptoms).   [name] ELIQUIS 5 MG TABS tablet TAKE ONE TABLET BY MOUTH EVERY MORNING and TAKE ONE TABLET BY MOUTH EVERY EVENING   . famotidine (PEPCID) 20 MG tablet Take 20 mg by mouth daily.   [name] FLUoxetine (PROZAC) 20 MG capsule TAKE 3 CAPSULES BY MOUTH  DAILY (Patient taking differently: Take 60 mg by mouth daily.) 10/04/2020: Last filled 08/08/20  . Fluticasone-Umeclidin-Vilant (TRELEGY ELLIPTA)  100-62.5-25 MCG/INH AEPB Inhale 1 puff into the lungs daily. 10/04/2020: Last filled 08/03/20  . furosemide (LASIX) 40 MG tablet Take 1 tablet (40 mg total) by mouth 2 (two) times daily. 10/04/2020: Last filled 08/03/20  . gabapentin (NEURONTIN) 300 MG capsule TAKE ONE CAPSULE BY MOUTH THREE TIMES DAILY (Patient taking differently: Take 300 mg by mouth 3 (three) times daily.) 10/04/2020: Last filled 08/03/20  . glucose blood (ONETOUCH ULTRA) test strip USE TO TEST BLOOD SUGAR 1-2 TIMES DAILY   . guaiFENesin (MUCINEX) 600 MG 12 hr tablet Take 1 tablet (600 mg total) by mouth 2 (two) times daily. 10/04/2020: Last filled 09/24/20  . ipratropium-albuterol (DUONEB) 0.5-2.5 (3) MG/3ML SOLN Take 3 mLs by nebulization every 6 (six) hours as needed. 10/04/2020: Last filled 09/24/20  . isosorbide mononitrate (IMDUR) 30 MG 24 hr tablet Take 30 mg by mouth daily. 10/04/2020: Last filled 08/03/20  . Lancets (ONETOUCH ULTRASOFT) lancets USE TO TEST BLOOD SUGAR 1-2 TIMES DAILY   . lubiprostone ([name]) 8 MCG capsule TAKE 1 CAPSULE(8 MCG) BY MOUTH TWICE DAILY WITH A MEAL (Patient taking differently: Take 8 mcg by mouth 2 (two) times daily with a meal.) 10/04/2020: Lasft filled 08/03/20  . Multiple Vitamin (MULTIVITAMIN WITH MINERALS) TABS tablet Take 1 tablet by mouth daily.   . nitroGLYCERIN (NITROSTAT) 0.4 MG SL tablet DISSOLVE 1 TABLET UNDER THE TONGUE EVERY 5 MINUTES AS  NEEDED FOR CHEST PAIN. MAX  OF 3 TABLETS IN 15 MINUTES. CALL 911 IF PAIN PERSISTS. (Patient taking differently: Place 0.4 mg under the tongue every 5 (five) minutes as needed for chest pain.) 10/04/2020: Last filled 07/25/20  . pantoprazole (PROTONIX) 40 MG tablet TAKE ONE TABLET BY MOUTH EVERY MORNING (Patient taking differently: Take 40 mg by mouth daily.) 10/04/2020: Last filled 08/03/20  . predniSONE (DELTASONE) 10 MG tablet Take 1 tablet (10 mg total) by mouth daily. Take 40 mg x 1 day, 30 mg x 3 days, 20 mg x 3 days and then 10 mg daily- do not stop until  your doctor tells you to.   . Roflumilast (DALIRESP) 250 MCG TABS Take 250 mcg by mouth daily.   . rosuvastatin (CRESTOR) 20 MG tablet Take 1 tablet (20 mg total) by mouth daily. 10/04/2020: Last filled 08/27/20  . traZODone (DESYREL) 50 MG tablet TAKE 1/2 TO 1 TABLET BY  MOUTH AT BEDTIME AS NEEDED  FOR SLEEP. (Patient taking differently: Take 25-50 mg by mouth at bedtime as needed for sleep.) 10/04/2020: No fill record in past 6 months   No facility-administered encounter medications on file as of 10/22/2020.    Wt Readings from Last 3 Encounters:  10/22/20 193 lb 9.6 oz (87.8 kg)  10/13/20 193 lb 4.8 oz (87.7 kg)  09/25/20 203 lb 9.6 oz (92.4 kg)    Current Diagnosis/Assessment:    Goals Addressed   None     AFIB   Managed by Dr. Harrell Gave  Patient is currently rate controlled.  Patient has failed these meds in past: n/a Patient is currently controlled on the following  medications:  . Carvedilol 12.5 mg twice daily  . Eliquis 5 mg twice daily   We discussed:  diet and exercise extensively. Patient denies unusual bruising/bleeding with Eliquis.   Plan  Continue current medications  Heart Failure   Managed by Dr. Harrell Gave  Type: Combined Systolic and Diastolic  Last ejection fraction: 45-50% (  04/07/20) NYHA Class: II (slight limitation of activity) AHA HF Stage: B (Heart disease present - no symptoms present)  Patient has failed these meds in past: Lisinopril (Angioedema) Patient is currently controlled on the following  medications:  . Amlodipine 5 mg daily  . Carvedilol 12.5 mg twice daily  . Furosemide 40 mg twice daily . Imdur 30 mg daily  . Losartan 100 mg daily  We discussed: Patient's weight continues to increase over the past two months, which patient attributes to replacing his cigarette habit with snacking. Discussed healthy alternatives when snacking, and trying to minimize snacking when bored or stressed.   Plan  Recommend starting  Jardiance 10 mg daily. Given current COPD exacerbations will defer until later visit.   Hypertension   BP goal is:  <130/80  Office blood pressures are  BP Readings from Last 3 Encounters:  10/22/20 128/82  10/13/20 (!) 164/70  09/25/20 (!) 142/70   CMP Latest Ref Rng & Units 10/11/2020 10/10/2020 10/09/2020  Glucose 70 - 99 mg/dL 113(H) 137(H) 130(H)  BUN 6 - 20 mg/dL 36(H) 41(H) 42(H)  Creatinine 0.61 - 1.24 mg/dL 1.54(H) 1.60(H) 1.72(H)  Sodium 135 - 145 mmol/L 143 140 140  Potassium 3.5 - 5.1 mmol/L 4.0 4.3 4.3  Chloride 98 - 111 mmol/L 102 102 100  CO2 22 - 32 mmol/L 31 28 29   Calcium 8.9 - 10.3 mg/dL 9.2 9.3 9.5  Total Protein 6.5 - 8.1 g/dL - - -  Total Bilirubin 0.3 - 1.2 mg/dL - - -  Alkaline Phos 38 - 126 U/L - - -  AST 15 - 41 U/L - - -  ALT 0 - 44 U/L - - -   Patient checks BP at home 3-5x per week Patient home BP readings are ranging: 140s/80-90s. On 08/25/20 it was 149/85.   Patient has failed these meds in the past: n/a Patient is currently uncontrolled on the following medications:  . Amlodipine 5 mg daily (AM)  . Carvedilol 12.5 mg twice daily  . Furosemide 20 mg daily . Imdur 30 mg daily  . Losartan 100 mg daily   We discussed diet and exercise extensively.   Plan  Continue current medications.  Recommend BMP at Cardiology follow-up on 12/15.   Diabetes   A1c goal <7%  Recent Relevant Labs: Lab Results  Component Value Date/Time   HGBA1C 6.3 (H) 09/13/2020 04:08 AM   HGBA1C 6.8 (H) 06/21/2020 09:14 AM   GFR 44.62 (L) 09/25/2020 11:54 AM   GFR 88.57 02/14/2020 08:54 AM   MICROALBUR <0.7 07/06/2018 01:55 PM    Last diabetic Eye exam: No results found for: HMDIABEYEEXA  Last diabetic Foot exam: No results found for: HMDIABFOOTEX   Checking BG: Daily  Patient has failed these meds in past: n/a Patient is currently controlled on the following medications: [name] Metformin 500 mg twice daily   We discussed: diet and exercise extensively. A1c at  goal, but recent fasting BG elevated. Patient has been skipping evening doses of his metformin after checking his blood sugars. Counseled patient on the fasting goal range and the importance of strict adherence to both doses of metformin to maintain good blood sugar control.   Plan  Continue current medications  Hyperlipidemia   LDL goal < 70  Lipid Panel     Component Value Date/Time   CHOL 226 (H) 07/06/2018 1355   TRIG 275 (H) 10/05/2020 0126   HDL 68.40 07/06/2018 1355   LDLCALC 119 (H) 07/06/2018 1355  Hepatic Function Latest Ref Rng & Units 10/05/2020 10/03/2020 09/12/2020  Total Protein 6.5 - 8.1 g/dL 5.2(L) 6.2(L) 6.3(L)  Albumin 3.5 - 5.0 g/dL 2.7(L) 3.3(L) 3.6  AST 15 - 41 U/L 24 86(H) 26  ALT 0 - 44 U/L 34 57(H) 18  Alk Phosphatase 38 - 126 U/L 58 81 60  Total Bilirubin 0.3 - 1.2 mg/dL 0.7 0.3 0.7  Bilirubin, Direct 0.0 - 0.2 mg/dL - <0.1 -     The 10-year ASCVD risk score Mikey Bussing DC Jr., et al., 2013) is: 34.9%   Values used to calculate the score:     Age: 75 years     Sex: Male     Is Non-Hispanic African American: Yes     Diabetic: Yes     Tobacco smoker: Yes     Systolic Blood Pressure: 887 mmHg     Is BP treated: Yes     HDL Cholesterol: 68.4 mg/dL     Total Cholesterol: 226 mg/dL   Patient has failed these meds in past: n/a Patient is currently uncontrolled on the following medications:  . Rosuvastatin 20 mg daily   We discussed:   Plan  Recommend Fasting Lipid Panel   COPD   Managed by Dr. Lamonte Sakai  Last spirometry score: FEV1 25% (05/02/20)  Gold Grade: Gold 4 (FEV1<40%) Current COPD Classification:  D (high sx, >/=2 exacerbations/yr)  Eosinophil count:   Lab Results  Component Value Date/Time   EOSPCT 0.3 09/25/2020 11:54 AM  %                               Eos (Absolute):  Lab Results  Component Value Date/Time   EOSABS 0.0 09/25/2020 11:54 AM    Tobacco Status:  Social History   Tobacco Use  Smoking Status Former Smoker  .  Packs/day: 2.00  . Years: 30.00  . Pack years: 60.00  . Types: Cigarettes  . Quit date: 05/01/2019  . Years since quitting: 1.4  Smokeless Tobacco Never Used  Tobacco Comment   smokes 2-3cigs per day   Patient has failed these meds in past: n/a Patient is currently uncontrolled on the following  medications:  . Proventil HFA  . Albuterol 0.083% Neb . Atrovent 0.02% Neb . Trelegy 100-62.5-25 1 puff daily  . Prednisone 10 mg daily   Using maintenance inhaler regularly? Yes Frequency of rescue inhaler use:  1-2x per week. Uses Nebulizer 3-4 times daily.    We discussed: Has not started Daliresp yet, required a PA and patient unable to afford.   Plan  Continue current medications    Depression / Anxiety   PHQ9 Score:  PHQ9 SCORE ONLY 09/25/2020 08/27/2020 07/02/2020  PHQ-9 Total Score 1 0 0   GAD7 Score: GAD 7 : Generalized Anxiety Score 05/20/2019 11/15/2018 07/20/2018 07/06/2018  Nervous, Anxious, on Edge 1 1 1 2   Control/stop worrying 1 1 1 1   Worry too much - different things 1 1 1 1   Trouble relaxing 1 2 2 1   Restless 1 1 1 1   Easily annoyed or irritable 1 1 2 2   Afraid - awful might happen 1 0 1 1  Total GAD 7 Score 7 7 9 9    Patient has failed these meds in past: n/a Patient is currently controlled on the following medications:  . Aripiprazole 5 mg daily  . Buspirone 15 mg twice daily  . Fluoxetine 20 mg 3 caps daily  We  discussed:    Plan  Continue current medications  GERD   Patient reports dysphagia or heartburn. Expresses understanding to avoid triggers such as fatty foods and large meals.  Currently controlled on: [name] Famotidine 20 mg daily  . Pantoprazole 40 mg daily  . Good Sense Antacid chewable  Plan   Continue current medication.  Chronic Constipation   Patient has failed these meds in past: *** Patient is currently {CHL Controlled/[phone]} on the following medications:  . ***Lubiprostone 8 mcg twice daily  We discussed:   ***  Plan  Continue {CHL [facility] Plans:[phone]}   Misc / OTC    . APAP 325 2 tabs q6hr PRN . Diphenhydramine 25 mg 2 tablet q6hr PRN  . Gabapentin 300 mg TID -Neuropathy in Feet (not as irritating)  . Multivitamin daily . Nitroglycerin 0.4 mg PRN  . Trazodone 50 mg QHS PRN   We discussed:   Plan  Continue current medications  Vaccines   Reviewed and discussed patient's vaccination history.    Immunization History  Administered Date(s) Administered  . Influenza,inj,Quad PF,6+ Mos 06/25/2018, 05/20/2019  . Influenza-Unspecified 06/28/2020  . PFIZER(Purple Top)SARS-COV-2 Vaccination 12/22/2019, 01/16/2020  . Pneumococcal Polysaccharide-23 06/08/2016  . Zoster Recombinat (Shingrix) 07/22/2019    Medication Management   [facility]  Reviewed patient's [facility] medication and Epic medication profile assuring there are no discrepancies or gaps in therapy. Confirmed all fill dates appropriate and verified with patient that there is a sufficient quantity of all prescribed medications at home. Informed patient to call me any time if needing medications before scheduled deliveries. The anticipated medication sync date is 09/08/20.  Follow up: 1 month phone visit  [place] Pharmacist [facility]  [phone]

## 2020-10-22 NOTE — Patient Instructions (Addendum)
Continue on TRELEGY 1 puff daily, rinse after use.  Begin Daliresp daily  When available  Mucinex DM Twice daily  As needed  Cough/congestion  Begin Flutter valve Three times a day   Call back if you would like referral to Palliative.  Continue on Duoneb nebs Three times a day   Continue on Oxygen 2l/m  Taper prednisone to 10mg  daily and hold at this dose .  Continue on CPAP At bedtime  . -need to wear all night long and naps.  Use caution with sedating medications  Chest xray today .  Follow up with Dr. or Bexlee Bergdoll NP in 4 weeks  and As needed  Please contact office for sooner follow up if symptoms do not improve or worsen or seek emergency care

## 2020-10-22 NOTE — Assessment & Plan Note (Signed)
Continue on oxygen therapy.  No increased demands on O2.  Patient has had 2 acute on chronic respiratory failures and a PEA arrest.  For now patient will continue on CPAP as he has this in the home.  Could consider a noninvasive bilevel support to help with recurrent exacerbations.  Plan  Patient Instructions  Continue on TRELEGY 1 puff daily, rinse after use.  Begin Daliresp daily  When available  Mucinex DM Twice daily  As needed  Cough/congestion  Begin Flutter valve Three times a day   Call back if you would like referral to Palliative.  Continue on Duoneb nebs Three times a day   Continue on Oxygen 2l/m  Taper prednisone to 10mg  daily and hold at this dose .  Continue on CPAP At bedtime  . -need to wear all night long and naps.  Use caution with sedating medications  Chest xray today .  Follow up with Dr. or Chaska Hagger NP in 4 weeks  and As needed  Please contact office for sooner follow up if symptoms do not improve or worsen or seek emergency care

## 2020-10-23 ENCOUNTER — Other Ambulatory Visit: Payer: Self-pay | Admitting: *Deleted

## 2020-10-23 ENCOUNTER — Encounter (HOSPITAL_COMMUNITY): Payer: Self-pay | Admitting: Internal Medicine

## 2020-10-23 DIAGNOSIS — I10 Essential (primary) hypertension: Secondary | ICD-10-CM | POA: Diagnosis not present

## 2020-10-23 DIAGNOSIS — K5909 Other constipation: Secondary | ICD-10-CM | POA: Diagnosis not present

## 2020-10-23 DIAGNOSIS — K219 Gastro-esophageal reflux disease without esophagitis: Secondary | ICD-10-CM | POA: Diagnosis present

## 2020-10-23 DIAGNOSIS — I5043 Acute on chronic combined systolic (congestive) and diastolic (congestive) heart failure: Secondary | ICD-10-CM | POA: Diagnosis not present

## 2020-10-23 DIAGNOSIS — E785 Hyperlipidemia, unspecified: Secondary | ICD-10-CM | POA: Diagnosis present

## 2020-10-23 DIAGNOSIS — F32A Depression, unspecified: Secondary | ICD-10-CM | POA: Diagnosis present

## 2020-10-23 DIAGNOSIS — B965 Pseudomonas (aeruginosa) (mallei) (pseudomallei) as the cause of diseases classified elsewhere: Secondary | ICD-10-CM | POA: Diagnosis present

## 2020-10-23 DIAGNOSIS — N179 Acute kidney failure, unspecified: Secondary | ICD-10-CM

## 2020-10-23 DIAGNOSIS — N189 Chronic kidney disease, unspecified: Secondary | ICD-10-CM

## 2020-10-23 DIAGNOSIS — J9621 Acute and chronic respiratory failure with hypoxia: Secondary | ICD-10-CM

## 2020-10-23 DIAGNOSIS — F419 Anxiety disorder, unspecified: Secondary | ICD-10-CM | POA: Diagnosis present

## 2020-10-23 DIAGNOSIS — Z20822 Contact with and (suspected) exposure to covid-19: Secondary | ICD-10-CM | POA: Diagnosis present

## 2020-10-23 DIAGNOSIS — Z7901 Long term (current) use of anticoagulants: Secondary | ICD-10-CM

## 2020-10-23 DIAGNOSIS — I429 Cardiomyopathy, unspecified: Secondary | ICD-10-CM | POA: Diagnosis present

## 2020-10-23 DIAGNOSIS — I5042 Chronic combined systolic (congestive) and diastolic (congestive) heart failure: Secondary | ICD-10-CM | POA: Diagnosis not present

## 2020-10-23 DIAGNOSIS — I480 Paroxysmal atrial fibrillation: Secondary | ICD-10-CM | POA: Diagnosis present

## 2020-10-23 DIAGNOSIS — F17210 Nicotine dependence, cigarettes, uncomplicated: Secondary | ICD-10-CM | POA: Diagnosis present

## 2020-10-23 DIAGNOSIS — E1165 Type 2 diabetes mellitus with hyperglycemia: Secondary | ICD-10-CM | POA: Diagnosis present

## 2020-10-23 DIAGNOSIS — Z833 Family history of diabetes mellitus: Secondary | ICD-10-CM | POA: Diagnosis not present

## 2020-10-23 DIAGNOSIS — J441 Chronic obstructive pulmonary disease with (acute) exacerbation: Secondary | ICD-10-CM | POA: Diagnosis present

## 2020-10-23 DIAGNOSIS — E1122 Type 2 diabetes mellitus with diabetic chronic kidney disease: Secondary | ICD-10-CM | POA: Diagnosis present

## 2020-10-23 DIAGNOSIS — I248 Other forms of acute ischemic heart disease: Secondary | ICD-10-CM | POA: Diagnosis present

## 2020-10-23 DIAGNOSIS — G4733 Obstructive sleep apnea (adult) (pediatric): Secondary | ICD-10-CM | POA: Diagnosis present

## 2020-10-23 DIAGNOSIS — Z8249 Family history of ischemic heart disease and other diseases of the circulatory system: Secondary | ICD-10-CM | POA: Diagnosis not present

## 2020-10-23 DIAGNOSIS — Z888 Allergy status to other drugs, medicaments and biological substances status: Secondary | ICD-10-CM | POA: Diagnosis not present

## 2020-10-23 DIAGNOSIS — I313 Pericardial effusion (noninflammatory): Secondary | ICD-10-CM | POA: Diagnosis present

## 2020-10-23 DIAGNOSIS — I130 Hypertensive heart and chronic kidney disease with heart failure and stage 1 through stage 4 chronic kidney disease, or unspecified chronic kidney disease: Secondary | ICD-10-CM | POA: Diagnosis present

## 2020-10-23 DIAGNOSIS — Z8674 Personal history of sudden cardiac arrest: Secondary | ICD-10-CM | POA: Diagnosis not present

## 2020-10-23 LAB — CBC
HCT: 31.6 % — ABNORMAL LOW (ref 39.0–52.0)
Hemoglobin: 9.2 g/dL — ABNORMAL LOW (ref 13.0–17.0)
MCH: 27.6 pg (ref 26.0–34.0)
MCHC: 29.1 g/dL — ABNORMAL LOW (ref 30.0–36.0)
MCV: 94.9 fL (ref 80.0–100.0)
Platelets: 262 10*3/uL (ref 150–400)
RBC: 3.33 MIL/uL — ABNORMAL LOW (ref 4.22–5.81)
RDW: 13.8 % (ref 11.5–15.5)
WBC: 11.6 10*3/uL — ABNORMAL HIGH (ref 4.0–10.5)
nRBC: 0 % (ref 0.0–0.2)

## 2020-10-23 LAB — BRAIN NATRIURETIC PEPTIDE: B Natriuretic Peptide: 1701.2 pg/mL — ABNORMAL HIGH (ref 0.0–100.0)

## 2020-10-23 LAB — COMPREHENSIVE METABOLIC PANEL
ALT: 34 U/L (ref 0–44)
ALT: 34 U/L (ref 0–44)
AST: 45 U/L — ABNORMAL HIGH (ref 15–41)
AST: 32 U/L (ref 15–41)
Albumin: 3.4 g/dL — ABNORMAL LOW (ref 3.5–5.0)
Albumin: 3.4 g/dL — ABNORMAL LOW (ref 3.5–5.0)
Alkaline Phosphatase: 127 U/L — ABNORMAL HIGH (ref 38–126)
Alkaline Phosphatase: 138 U/L — ABNORMAL HIGH (ref 38–126)
Anion gap: 16 — ABNORMAL HIGH (ref 5–15)
Anion gap: 10 (ref 5–15)
BUN: 11 mg/dL (ref 6–20)
BUN: 9 mg/dL (ref 6–20)
CO2: 29 mmol/L (ref 22–32)
CO2: 36 mmol/L — ABNORMAL HIGH (ref 22–32)
Calcium: 8.9 mg/dL (ref 8.9–10.3)
Calcium: 9 mg/dL (ref 8.9–10.3)
Chloride: 90 mmol/L — ABNORMAL LOW (ref 98–111)
Chloride: 90 mmol/L — ABNORMAL LOW (ref 98–111)
Creatinine, Ser: 2.03 mg/dL — ABNORMAL HIGH (ref 0.61–1.24)
Creatinine, Ser: 1.84 mg/dL — ABNORMAL HIGH (ref 0.61–1.24)
GFR, Estimated: 37 mL/min — ABNORMAL LOW (ref >60)
GFR, Estimated: 42 mL/min — ABNORMAL LOW (ref >60)
Glucose, Bld: 528 mg/dL (ref 70–99)
Glucose, Bld: 404 mg/dL — ABNORMAL HIGH (ref 70–99)
Potassium: 4.4 mmol/L (ref 3.5–5.1)
Potassium: 4.4 mmol/L (ref 3.5–5.1)
Sodium: 135 mmol/L (ref 135–145)
Sodium: 136 mmol/L (ref 135–145)
Total Bilirubin: 0.6 mg/dL (ref 0.3–1.2)
Total Bilirubin: 0.8 mg/dL (ref 0.3–1.2)
Total Protein: 5.9 g/dL — ABNORMAL LOW (ref 6.5–8.1)
Total Protein: 6.2 g/dL — ABNORMAL LOW (ref 6.5–8.1)

## 2020-10-23 LAB — PROCALCITONIN
Procalcitonin: <0.1 ng/mL
Procalcitonin: <0.1 ng/mL

## 2020-10-23 LAB — CBG MONITORING, ED
Glucose-Capillary: 148 mg/dL — ABNORMAL HIGH (ref 70–99)
Glucose-Capillary: 267 mg/dL — ABNORMAL HIGH (ref 70–99)
Glucose-Capillary: 201 mg/dL — ABNORMAL HIGH (ref 70–99)
Glucose-Capillary: 379 mg/dL — ABNORMAL HIGH (ref 70–99)

## 2020-10-23 LAB — TROPONIN I (HIGH SENSITIVITY)
Troponin I (High Sensitivity): 30 ng/L — ABNORMAL HIGH (ref <18)
Troponin I (High Sensitivity): 43 ng/L — ABNORMAL HIGH (ref <18)

## 2020-10-23 LAB — LACTIC ACID, PLASMA: Lactic Acid, Venous: 2.2 mmol/L (ref 0.5–1.9)

## 2020-10-23 LAB — SARS CORONAVIRUS 2 BY RT PCR (HOSPITAL ORDER, PERFORMED IN ~~LOC~~ HOSPITAL LAB): SARS Coronavirus 2: NEGATIVE

## 2020-10-23 MED ORDER — INSULIN ASPART 100 UNIT/ML ~~LOC~~ SOLN: 0.0000 [IU] | SUBCUTANEOUS | Status: DC

## 2020-10-23 MED ORDER — ARIPIPRAZOLE 5 MG PO TABS
5.0000 mg | ORAL_TABLET | Freq: Every day | ORAL | Status: DC
  Administered 2020-10-23 – 2020-10-24 (×2): 5 mg via ORAL
  Filled 2020-10-23 (×2): qty 1

## 2020-10-23 MED ORDER — SODIUM CHLORIDE 0.9 % IV SOLN
2.0000 g | Freq: Once | INTRAVENOUS | Status: AC
  Administered 2020-10-23: 2 g via INTRAVENOUS
  Filled 2020-10-23: qty 2

## 2020-10-23 MED ORDER — GABAPENTIN 300 MG PO CAPS
300.0000 mg | ORAL_CAPSULE | Freq: Three times a day (TID) | ORAL | Status: DC
  Administered 2020-10-23 – 2020-10-24 (×5): 300 mg via ORAL
  Filled 2020-10-23 (×5): qty 1

## 2020-10-23 MED ORDER — FLUTICASONE FUROATE-VILANTEROL 100-25 MCG/INH IN AEPB
1.0000 | INHALATION_SPRAY | Freq: Every day | RESPIRATORY_TRACT | Status: DC
  Administered 2020-10-23: 1 via RESPIRATORY_TRACT
  Filled 2020-10-23: qty 28

## 2020-10-23 MED ORDER — FUROSEMIDE 10 MG/ML IJ SOLN
40.0000 mg | Freq: Two times a day (BID) | INTRAMUSCULAR | Status: DC
  Administered 2020-10-23 – 2020-10-24 (×2): 40 mg via INTRAVENOUS
  Filled 2020-10-23 (×2): qty 4

## 2020-10-23 MED ORDER — ALBUTEROL SULFATE (2.5 MG/3ML) 0.083% IN NEBU
2.5000 mg | INHALATION_SOLUTION | RESPIRATORY_TRACT | Status: DC | PRN
  Administered 2020-10-24: 2.5 mg via RESPIRATORY_TRACT
  Filled 2020-10-23: qty 3

## 2020-10-23 MED ORDER — SODIUM CHLORIDE 0.9 % IV SOLN
2.0000 g | Freq: Two times a day (BID) | INTRAVENOUS | Status: DC
  Administered 2020-10-23 – 2020-10-24 (×3): 2 g via INTRAVENOUS
  Filled 2020-10-23 (×3): qty 2

## 2020-10-23 MED ORDER — INSULIN ASPART 100 UNIT/ML ~~LOC~~ SOLN
0.0000 [IU] | Freq: Three times a day (TID) | SUBCUTANEOUS | Status: DC
  Administered 2020-10-23: 7 [IU] via SUBCUTANEOUS
  Administered 2020-10-23: 20 [IU] via SUBCUTANEOUS
  Administered 2020-10-23: 11 [IU] via SUBCUTANEOUS
  Administered 2020-10-24: 20 [IU] via SUBCUTANEOUS

## 2020-10-23 MED ORDER — FLUTICASONE-UMECLIDIN-VILANT 100-62.5-25 MCG/INH IN AEPB: 1.0000 | INHALATION_SPRAY | Freq: Every day | RESPIRATORY_TRACT | Status: DC

## 2020-10-23 MED ORDER — FUROSEMIDE 10 MG/ML IJ SOLN
40.0000 mg | Freq: Once | INTRAMUSCULAR | Status: AC
  Administered 2020-10-23: 40 mg via INTRAVENOUS
  Filled 2020-10-23: qty 4

## 2020-10-23 MED ORDER — PREDNISONE 20 MG PO TABS
40.0000 mg | ORAL_TABLET | Freq: Every day | ORAL | Status: DC
  Administered 2020-10-23 – 2020-10-24 (×2): 40 mg via ORAL
  Filled 2020-10-23 (×2): qty 2

## 2020-10-23 MED ORDER — NITROGLYCERIN 0.4 MG SL SUBL: 0.4000 mg | SUBLINGUAL_TABLET | SUBLINGUAL | Status: DC | PRN

## 2020-10-23 MED ORDER — ROSUVASTATIN CALCIUM 20 MG PO TABS
20.0000 mg | ORAL_TABLET | Freq: Every day | ORAL | Status: DC
  Administered 2020-10-23 – 2020-10-24 (×2): 20 mg via ORAL
  Filled 2020-10-23 (×2): qty 1

## 2020-10-23 MED ORDER — CARVEDILOL 12.5 MG PO TABS
12.5000 mg | ORAL_TABLET | Freq: Two times a day (BID) | ORAL | Status: DC
  Administered 2020-10-23 – 2020-10-24 (×3): 12.5 mg via ORAL
  Filled 2020-10-23: qty 1
  Filled 2020-10-23: qty 4
  Filled 2020-10-23: qty 1

## 2020-10-23 MED ORDER — SODIUM CHLORIDE 0.9 % IV SOLN: INTRAVENOUS | Status: AC

## 2020-10-23 MED ORDER — LUBIPROSTONE 8 MCG PO CAPS
8.0000 ug | ORAL_CAPSULE | Freq: Two times a day (BID) | ORAL | Status: DC
  Administered 2020-10-23 – 2020-10-24 (×2): 8 ug via ORAL
  Filled 2020-10-23 (×4): qty 1

## 2020-10-23 MED ORDER — BUSPIRONE HCL 5 MG PO TABS
15.0000 mg | ORAL_TABLET | Freq: Two times a day (BID) | ORAL | Status: DC
  Administered 2020-10-23 – 2020-10-24 (×4): 15 mg via ORAL
  Filled 2020-10-23 (×3): qty 2
  Filled 2020-10-23: qty 3

## 2020-10-23 MED ORDER — PANTOPRAZOLE SODIUM 40 MG PO TBEC
40.0000 mg | DELAYED_RELEASE_TABLET | Freq: Every day | ORAL | Status: DC
  Administered 2020-10-23 – 2020-10-24 (×2): 40 mg via ORAL
  Filled 2020-10-23 (×2): qty 1

## 2020-10-23 MED ORDER — UMECLIDINIUM BROMIDE 62.5 MCG/INH IN AEPB
1.0000 | INHALATION_SPRAY | Freq: Every day | RESPIRATORY_TRACT | Status: DC
  Administered 2020-10-23: 1 via RESPIRATORY_TRACT
  Filled 2020-10-23: qty 7

## 2020-10-23 MED ORDER — ACETAMINOPHEN 325 MG PO TABS: 650.0000 mg | ORAL_TABLET | Freq: Four times a day (QID) | ORAL | Status: DC | PRN

## 2020-10-23 MED ORDER — SODIUM CHLORIDE 0.9% FLUSH
3.0000 mL | Freq: Two times a day (BID) | INTRAVENOUS | Status: DC
  Administered 2020-10-23 – 2020-10-24 (×4): 3 mL via INTRAVENOUS

## 2020-10-23 MED ORDER — ISOSORBIDE MONONITRATE ER 30 MG PO TB24
30.0000 mg | ORAL_TABLET | Freq: Every day | ORAL | Status: DC
  Administered 2020-10-23 – 2020-10-24 (×2): 30 mg via ORAL
  Filled 2020-10-23 (×2): qty 1

## 2020-10-23 MED ORDER — ROFLUMILAST 500 MCG PO TABS
250.0000 ug | ORAL_TABLET | Freq: Every day | ORAL | Status: DC
  Administered 2020-10-23 – 2020-10-24 (×2): 250 ug via ORAL
  Filled 2020-10-23 (×2): qty 1

## 2020-10-23 MED ORDER — ACETAMINOPHEN 650 MG RE SUPP: 650.0000 mg | Freq: Four times a day (QID) | RECTAL | Status: DC | PRN

## 2020-10-23 MED ORDER — INSULIN ASPART 100 UNIT/ML IV SOLN
10.0000 [IU] | Freq: Once | INTRAVENOUS | Status: AC
  Administered 2020-10-23: 10 [IU] via INTRAVENOUS

## 2020-10-23 MED ORDER — HYDRALAZINE HCL 25 MG PO TABS
25.0000 mg | ORAL_TABLET | Freq: Three times a day (TID) | ORAL | Status: DC
  Administered 2020-10-23 – 2020-10-24 (×2): 25 mg via ORAL
  Filled 2020-10-23 (×2): qty 1

## 2020-10-23 MED ORDER — FLUOXETINE HCL 20 MG PO CAPS
60.0000 mg | ORAL_CAPSULE | Freq: Every day | ORAL | Status: DC
  Administered 2020-10-23 – 2020-10-24 (×2): 60 mg via ORAL
  Filled 2020-10-23 (×2): qty 3

## 2020-10-23 MED ORDER — APIXABAN 5 MG PO TABS
5.0000 mg | ORAL_TABLET | Freq: Two times a day (BID) | ORAL | Status: DC
  Administered 2020-10-23 – 2020-10-24 (×3): 5 mg via ORAL
  Filled 2020-10-23 (×3): qty 1

## 2020-10-23 NOTE — Consult Note (Signed)
Cardiology Consultation:   Patient ID: Kenneth [name] MRN: [id number]; DOB: 27-May-1961  Admit date: 10/22/2020 Date of Consult: 10/23/2020  Primary Care Provider: [name], MD [facility] Cardiologist: Buford Dresser, MD  [place] Electrophysiologist:  None    Patient Profile:   Kenneth [name] is a 60 y.o. male with a PMH of chronic combined CHF with recovery of EF, paroxysmal atrial fibrillation, HTN, HLD, DM type 2, severe COPD, OSA on CPAP, and tobacco abuse, who is being seen today for the evaluation of CHF at the request of Dr. Sloan Leiter.  History of Present Illness:   Kenneth [name] has had several admission in the past 1.5 months. Admitted 09/12/20-09/16/20 for acute on chronic respiratory failure in the setting of acute on chronic combined CHF and COPD exacerbation requiring brief intubation and IV diuresis. Again admitted 09/23/20-09/24/20 for COPD exacerbation at which time he received IV>po steroids and azithromycin with improvement in symptoms. More recently he was admitted 10/03/20-10/13/20 following an out of hospital PEA arrest with successful ROSC after 5 minutes of CPR and 1 round of epi. Felt to be 2/2 COPD exacerbation. Echo that admission showed EF 55-60%, no RWMA, mild LVH, indeterminate LV diastolic function, normal RV systolic function/size, small pericardial effusion, and mild-moderate AS.   He was last evaluated by cardiology at an outpatient visit with Dr. Harrell Gave 09/05/20 at which time he was doing fine from a cardiac standpoint with improved breathing on lasix 67m BID. No medication changes occurred at that visit and he was recommended to follow-up in 3 months. He has a longstanding history of chronic combined CHF. He underwent a cardiac catheterization in 2011 which reportedly revealed normal coronary arteries with EF 40% at that time. EF has been as low as 30-35% in 2019. The etiology of his cardiomyopathy was felt to be hypertension mediated.   He  was seen outpatient by pulmonology on the morning of 10/22/20, at which time he continued to have SOB and increased sputum which was difficult to cough up. They discussed a referral to Palliative Care at that time, though patient wanted to think about a referral further. Unfortunately later that evening he experienced acutely worsening SOB. EMS activated and he was found to be hypoxic to 80% on RA.  He was started on BiPAP and received 4 SL nitro en route to the ED with improvement in symptoms.  On arrival to the ED he was satting in the 90s on BiPAP, hypertensive, tachycardic, and afebrile. EKG with atrial fibrillation with RVR, rate 123 bpm, chronic subtle STD in lateral leads. Initial CXR showed improved aeration at the lung bases compared to 10/10/20, however repeat CXR ~12 hours later showed developing interstitial opacities at the right lung base suspicious for PNA.  Labs notable for K 4.4, glucose 528, Cr 2.03>1.84 (baseline ~1.5), Hgb 9.7, PLT 339, HsTrop 30>43, procalcitonin <0.10, BNP 1700. COVID-19 negative. Patient was given steroids, nebulizers, and antibiotics in the ED and was admitted to medicine. He was weaned to O2 via [place]. PCCM consulted and felt acute on chronic respiratory failure was most consistent with acute on chronic CHF. He was given IV lasix 417m Cardiology asked to evaluate.     Past Medical History:  Diagnosis Date  . Anxiety   . Arthritis   . Atrial fibrillation ([place]  . Chest tightness 06/12/2019  . CHF (congestive heart failure) ([facility]  . COPD (chronic obstructive pulmonary disease) (HCC)    emphysema  . Depression   . Diabetes mellitus without  complication ([facility])   . Heart murmur   . Hyperlipidemia   . Hypertension   . Sleep apnea with use of continuous positive airway pressure (CPAP)     Past Surgical History:  Procedure Laterality Date  . NO PAST SURGERIES       Home Medications:  Prior to Admission medications   Medication Sig Start Date End Date  Taking? Authorizing Provider  acetaminophen (TYLENOL) 325 MG tablet Take 650 mg by mouth every 6 (six) hours as needed for mild pain or headache.   Yes [provider]  albuterol (PROVENTIL) (2.5 MG/3ML) 0.083% nebulizer solution INHALE 1 VIAL VIA NEBULIZER 5 TIMES DAILY AS NEEDED FOR WHEEZING OR FOR SHORTNESS OF BREATH Patient taking differently: Take 2.5 mg by nebulization every 6 (six) hours as needed for shortness of breath. 10/13/20  Yes Rizwan, Eunice Blase, MD  ARIPiprazole (ABILIFY) 5 MG tablet Take 5 mg by mouth daily. 08/24/20  Yes [provider]  Blood Glucose Monitoring Suppl (ONE TOUCH ULTRA 2) w/Device KIT Use to test blood sugars 1-2 times daily. 04/02/20  Yes [name], MD  busPIRone (BUSPAR) 15 MG tablet Take 1 tablet (15 mg total) by mouth 2 (two) times daily. 09/06/19  Yes [name], MD  carvedilol (COREG) 12.5 MG tablet Take 1 tablet (12.5 mg total) by mouth 2 (two) times daily with a meal. 04/23/20  Yes Buford Dresser, MD  DM-GG & DM-APAP-CPM (CORICIDIN HBP DAY/NIGHT COLD) 10-20 &15-200-2 MG MISC Take 1 tablet by mouth every 12 (twelve) hours as needed (cold symptoms).   Yes [provider]  ELIQUIS 5 MG TABS tablet TAKE ONE TABLET BY MOUTH EVERY MORNING and TAKE ONE TABLET BY MOUTH EVERY EVENING Patient taking differently: Take 5 mg by mouth 2 (two) times daily. 10/22/20  Yes Buford Dresser, MD  famotidine (PEPCID) 20 MG tablet Take 20 mg by mouth daily.   Yes [provider]  FLUoxetine (PROZAC) 20 MG capsule TAKE 3 CAPSULES BY MOUTH  DAILY Patient taking differently: Take 60 mg by mouth daily. 05/26/19  Yes [name], MD  Fluticasone-Umeclidin-Vilant (TRELEGY ELLIPTA) 100-62.5-25 MCG/INH AEPB Inhale 1 puff into the lungs daily. 04/02/20  Yes Collene Gobble, MD  furosemide (LASIX) 40 MG tablet Take 1 tablet (40 mg total) by mouth 2 (two) times daily. 08/02/20  Yes Buford Dresser, MD  gabapentin  (NEURONTIN) 300 MG capsule TAKE ONE CAPSULE BY MOUTH THREE TIMES DAILY Patient taking differently: Take 300 mg by mouth 3 (three) times daily. 08/03/20  Yes [name], MD  glucose blood [facility] ULTRA) test strip USE TO TEST BLOOD SUGAR 1-2 TIMES DAILY 05/22/20  Yes [name], MD  guaiFENesin (MUCINEX) 600 MG 12 hr tablet Take 1 tablet (600 mg total) by mouth 2 (two) times daily. 09/24/20  Yes Regalado, Belkys A, MD  ipratropium-albuterol (DUONEB) 0.5-2.5 (3) MG/3ML SOLN Take 3 mLs by nebulization every 6 (six) hours as needed. Patient taking differently: Take 3 mLs by nebulization every 6 (six) hours as needed (For shortness of breath). 09/24/20  Yes Regalado, Belkys A, MD  isosorbide mononitrate (IMDUR) 30 MG 24 hr tablet Take 30 mg by mouth daily. 07/04/20  Yes [provider]  lubiprostone ([name]) 8 MCG capsule Take 1 capsule (8 mcg total) by mouth 2 (two) times daily with a meal. 10/22/20  Yes [name], MD  Multiple Vitamin (MULTIVITAMIN WITH MINERALS) TABS tablet Take 1 tablet by mouth daily.   Yes [provider]  nitroGLYCERIN (NITROSTAT) 0.4 MG SL tablet DISSOLVE 1 TABLET UNDER THE TONGUE EVERY 5 MINUTES AS&nbsp;&nbsp;NEEDED FOR CHEST PAIN. MAX&nbsp;&nbsp;OF 3 TABLETS IN 15 MINUTES. CALL 911 IF PAIN PERSISTS. Patient taking differently: Place 0.4 mg under the tongue every 5 (five) minutes as needed for chest pain. 07/25/20  Yes [name], MD  pantoprazole (PROTONIX) 40 MG tablet TAKE ONE TABLET BY MOUTH EVERY MORNING Patient taking differently: Take 40 mg by mouth daily. 08/03/20  Yes Esterwood, Amy S, PA-C  predniSONE (DELTASONE) 10 MG tablet Take 1 tablet (10 mg total) by mouth daily with breakfast. 10/22/20  Yes Parrett, Tammy S, NP  rosuvastatin (CRESTOR) 20 MG tablet Take 1 tablet (20 mg total) by mouth daily. 08/27/20  Yes Dutch Quint B, FNP  traZODone (DESYREL) 50 MG tablet TAKE 1/2 TO 1 TABLET BY  MOUTH AT BEDTIME AS  NEEDED  FOR SLEEP. Patient taking differently: Take 25-50 mg by mouth at bedtime as needed for sleep. 05/26/19  Yes [name], MD  Lancets [facility] ULTRASOFT) lancets USE TO TEST BLOOD SUGAR 1-2 TIMES DAILY Patient not taking: Reported on 10/23/2020 05/22/20   [name], MD    Inpatient Medications: Scheduled Meds: . apixaban  5 mg Oral BID  . ARIPiprazole  5 mg Oral Daily  . busPIRone  15 mg Oral BID  . carvedilol  12.5 mg Oral BID WC  . FLUoxetine  60 mg Oral Daily  . fluticasone furoate-vilanterol  1 puff Inhalation Daily   And  . umeclidinium bromide  1 puff Inhalation Daily  . gabapentin  300 mg Oral TID  . insulin aspart  0-20 Units Subcutaneous TID WC  . ipratropium-albuterol  3 mL Nebulization Once  . isosorbide mononitrate  30 mg Oral Daily  . lubiprostone  8 mcg Oral BID WC  . pantoprazole  40 mg Oral Daily  . predniSONE  40 mg Oral Q breakfast  . roflumilast  250 mcg Oral Daily  . rosuvastatin  20 mg Oral Daily  . sodium chloride flush  3 mL Intravenous Q12H   Continuous Infusions: . ceFEPime (MAXIPIME) IV Stopped (10/23/20 1138)   PRN Meds: acetaminophen **OR** acetaminophen, albuterol, nitroGLYCERIN  Allergies:    Allergies  Allergen Reactions  . Lisinopril Swelling  . Other Other (See Comments)    Lettuce : rash  . Tomato Rash    Social History:   Social History   Socioeconomic History  . Marital status: Divorced    Spouse name: Not on file  . Number of children: 1  . Years of education: Not on file  . Highest education level: Not on file  Occupational History  . Occupation: disabled  Tobacco Use  . Smoking status: Former Smoker    Packs/day: 2.00    Years: 30.00    Pack years: 60.00    Types: Cigarettes    Quit date: 05/01/2019    Years since quitting: 1.4  . Smokeless tobacco: Never Used  . Tobacco comment: smokes 2-3cigs per day  Vaping Use  . Vaping Use: Never used  Substance and Sexual Activity  . Alcohol use:  Yes    Comment: Drinks up to six beers around a game  . Drug use: Yes    Types: Marijuana    Comment: occ marijuana  . Sexual activity: Yes    Partners: Female  Other Topics Concern  . Not on file  Social History Narrative  . Not on file   Social Determinants of Health   Financial  Resource Strain: Low Risk   . Difficulty of Paying Living Expenses: Not hard at all  Food Insecurity: No Food Insecurity  . Worried About Charity fundraiser in the Last Year: Never true  . Ran Out of Food in the Last Year: Never true  Transportation Needs: No Transportation Needs  . Lack of Transportation (Medical): No  . Lack of Transportation (Non-Medical): No  Physical Activity: Not on file  Stress: Not on file  Social Connections: Not on file  Intimate Partner Violence: Not on file    Family History:    Family History  Problem Relation Age of Onset  . Diabetes Mother   . Heart disease Mother   . Diabetes Father   . Heart disease Father   . Diabetes Sister   . Heart disease Sister   . Kidney disease Sister   . Coronary artery disease Brother   . Liver disease Maternal Uncle      ROS:  Please see the history of present illness.   All other ROS reviewed and negative.     Physical Exam/Data:   Vitals:   10/23/20 1500 10/23/20 1530 10/23/20 1600 10/23/20 1630  BP: (!) 150/78 134/61 (!) 154/81 (!) 144/90  Pulse: 86 71 72 84  Resp: 17 13 13 18   Temp:      TempSrc:      SpO2: 99% 100% 100% 100%  Weight:      Height:       No intake or output data in the 24 hours ending 10/23/20 1804 Last 3 Weights 10/22/2020 10/22/2020 10/13/2020  Weight (lbs) 193 lb 9 oz 193 lb 9.6 oz 193 lb 4.8 oz  Weight (kg) 87.8 kg 87.816 kg 87.68 kg     Body mass index is 26.25 kg/m.  Physical exam per MD below  EKG:  The EKG was personally reviewed and demonstrates:  atrial fibrillation with RVR, rate 123 bpm, chronic subtle STD in lateral leads.   Relevant CV Studies: Echocardiogram 10/04/20: 1. Left  ventricular ejection fraction, by estimation, is 55 to 60%. The  left ventricle has normal function. The left ventricle has no regional  wall motion abnormalities. There is mild left ventricular hypertrophy.  Left ventricular diastolic parameters  are indeterminate.  2. Right ventricular systolic function is normal. The right ventricular  size is normal. Tricuspid regurgitation signal is inadequate for assessing  PA pressure.  3. A small pericardial effusion is present.  4. The mitral valve is normal in structure. No evidence of mitral valve  regurgitation.  5. The aortic valve was not well visualized. Aortic valve regurgitation  is mild. Mild to moderate aortic valve stenosis. Vmax 2.8 m/s, MG 5mHg,  AVA 1.6 cm^2, DI 0.4   Laboratory Data:  High Sensitivity Troponin:   Recent Labs  Lab 10/03/20 2020 10/03/20 2209 10/22/20 2301 10/23/20 0105  TROPONINIHS 44* 167* 30* 43*     Chemistry Recent Labs  Lab 10/22/20 2301 10/22/20 2314 10/23/20 0105  NA 135 134* 136  K 4.4 4.3 4.4  CL 90*  --  90*  CO2 29  --  36*  GLUCOSE 528*  --  404*  BUN 11  --  9  CREATININE 2.03*  --  1.84*  CALCIUM 8.9  --  9.0  GFRNONAA 37*  --  42*  ANIONGAP 16*  --  10    Recent Labs  Lab 10/22/20 2301 10/23/20 0105  PROT 5.9* 6.2*  ALBUMIN 3.4* 3.4*  AST 45* 32  ALT 34 34  ALKPHOS 127* 138*  BILITOT 0.6 0.8   Hematology Recent Labs  Lab 10/22/20 2301 10/22/20 2314 10/23/20 0417  WBC 8.9  --  11.6*  RBC 3.57*  --  3.33*  HGB 9.7* 11.6* 9.2*  HCT 33.4* 34.0* 31.6*  MCV 93.6  --  94.9  MCH 27.2  --  27.6  MCHC 29.0*  --  29.1*  RDW 13.7  --  13.8  PLT 339  --  262   BNP Recent Labs  Lab 10/23/20 0417  BNP 1,701.2*    DDimer No results for input(s): DDIMER in the last 168 hours.   Radiology/Studies:  DG Chest 2 View  Result Date: 10/22/2020 CLINICAL DATA:  COPD, follow-up EXAM: CHEST - 2 VIEW COMPARISON:  10/10/2020 FINDINGS: Background changes of COPD. Improved  aeration at the lung bases. No pleural effusion. No pneumothorax. Cardiomediastinal contours are within normal limits. No acute osseous abnormality. IMPRESSION: Improved aeration at the lung bases since 10/10/2020. Electronically Signed   By: Macy Mis M.D.   On: 10/22/2020 11:21   DG Chest Port 1 View  Result Date: 10/22/2020 CLINICAL DATA:  Shortness of breath. EXAM: PORTABLE CHEST 1 VIEW COMPARISON:  Radiograph earlier today.  Chest CT 10/03/2020 FINDINGS: Emphysema with chronic hyperinflation. Developing interstitial opacities at the right lung base from earlier today. Stable heart size and mediastinal contours, aortic atherosclerosis. No pleural effusion or pneumothorax. No acute osseous abnormalities are seen. IMPRESSION: 1. Developing interstitial opacities at the right lung base from earlier today, suspicious for pneumonia. Asymmetric pulmonary edema is felt less likely given distribution. 2. Emphysema and chronic hyperinflation. Electronically Signed   By: Keith Rake M.D.   On: 10/22/2020 23:17     Assessment and Plan:   1. Acute on chronic respiratory failure in patient with chronic combined CHF: patient presented with acute onset worsening SOB. Found to be hypoxic to 80% on RA on EMS arrival which improved with BiPAP and 4 SL nitro en route to the ED. Initial CXR without significant findings, however repeat suggestive of developing opacities in RLL c/w PNA. BNP was elevated to 1700s. Seen by PCCM who felt presentation and work-up were not c/w COPD exacerbation. He was given IV lasix 81m x1 dose. Weaned off BiPAP to O2 via [place]. Cr 2/03 on admission, down to 1.8 today (baseline ~1.5)  - Continue IV lasix 40 mg IV BID - Continue carvedilol  - Continue to monitor strict I&Os and daily weights.  - Continue to monitor electrolytes and replete as needed to maintain K >4, Mg >2  2. Paroxysmal atrial fibrillation: EKG on admission showed afib with RVR with rates in the 120s, now in the  70s-80s.  - Continue carvedilol for rate control - Continue eliquis for stroke ppx.   3. HTN: BP persistently elevated this admission. Suspected to be the etiology of his combined CHF in the past.  - Continue carvedilol  4. HLD: no recent lipids on file - Continue statin  5. DM type 2: A1C 6.3 08/2020. Glucose significantly elevated to 500s on admission. Now on ISS  - Continue management per primary team  6. COPD: several admissions in the past month for COPD exacerbation. He remains on po steroids and IV antibiotics. PCCM following with plans to repeat procalcitonin tomorrow to determine need for ongoing antibiotics.  - Continue antibiotics and supportive care per primary team and PCCM.   Risk Assessment/Risk Scores:   New York Heart Association (NYHA) Functional Class NYHA Class  II  CHA2DS2-VASc Score = 3  This indicates a 3.2% annual risk of stroke. The patient's score is based upon: CHF History: Yes HTN History: Yes Diabetes History: Yes Stroke History: No Vascular Disease History: No Age Score: 0 Gender Score: 0   For questions or updates, please contact [place] Please consult [email or web address] for contact info under   Signed, Ena Dawley, MD  10/23/2020 6:04 PM   The patient was seen, examined and discussed with Abigail Butts, PA-C  and I agree with the above.   60 y.o. male with a PMH of chronic combined CHF (30-35%->55-60%), previously underwent cardiac catheterization with normal coronaries in 2011 with LVEF 40%, CHF felt to be secondary to uncontrolled hypertension, paroxysmal atrial fibrillation, HTN, HLD, DM type 2, severe COPD, OSA on CPAP, and tobacco abuse, who is being seen today for the evaluation of CHF.  This is Kenneth [name] third admission in the last 1 and half months. He was admitted on 09/12/20-09/16/20 for acute on chronic respiratory failure in the setting of acute on chronic combined CHF and COPD exacerbation requiring brief intubation and IV  diuresis. Again admitted 09/23/20-09/24/20 for COPD exacerbation at which time he received IV>po steroids and azithromycin with improvement in symptoms. Re-admitted 10/03/20-10/13/20 following an out of hospital PEA arrest with successful ROSC after 5 minutes of CPR and 1 round of epi. Felt to be 2/2 COPD exacerbation. Echo that admission showed EF 55-60%, no RWMA, mild LVH, indeterminate LV diastolic function, normal RV systolic function/size, small pericardial effusion, and mild-moderate AS.  Palliative care has been considered in the past but he has not been referred to them yet.  He presented with worsening shortness of breath, EMS found him hypoxic at 80% on room air, he was started on BiPAP and received 4 SL nitro en route to the ED with improvement in symptoms. On arrival to the ED he was satting in the 90s on BiPAP, hypertensive, tachycardic, and afebrile. EKG with atrial fibrillation with RVR, rate 123 bpm, chronic subtle STD in lateral leads. Initial CXR showed improved aeration at the lung bases compared to 10/10/20, however repeat CXR ~12 hours later showed developing interstitial opacities at the right lung base suspicious for PNA.    Labs notable for K 4.4, glucose 528, Cr 2.03>1.84 (baseline ~1.5), Hgb 9.7, PLT 339, HsTrop 30>43, procalcitonin <0.10, BNP 1700. COVID-19 negative. Patient was given steroids, nebulizers, and antibiotics in the ED and was admitted to medicine. He was weaned to O2 via [place]. PCCM consulted and felt acute on chronic respiratory failure was most consistent with acute on chronic CHF. He was given IV lasix 29m.  The patient states he was compliant with his meds at home he was not on any diuretics.  Assessment and plan  Acute on chronic respiratory failure, secondary to COPD and possible pneumonia Acute on chronic diastolic CHF Hypertensive heart disease with CHF Elevated troponin -demand ischemia Paroxysmal atrial fibrillation -on chronic anticoagulation with Eliquis  I  would initiate Lasix 40 mg IV twice daily follow closely, assistant with pulmonary edema, will follow creatinine, today 1.84, baseline anywhere from 1.6-2.0.  Follow I&O's closely. His blood pressure is uncontrolled, I will add hydralazine 25 mg p.o. 3 times daily to his regimen.  KEna Dawley MD 10/23/2020

## 2020-10-23 NOTE — Progress Notes (Signed)
Patient seen and examined.  Admitted early morning hours by nighttime hospitalist.  Patient with severe COPD, CKD, recent cardiac arrest with COPD exacerbation, obstructive sleep apnea on CPAP, diabetes, hypertension, hyperlipidemia, paroxysmal A. fib, anticoagulation on Eliquis presented with sudden onset of respiratory distress and shortness of breath.  With evidence of COPD exacerbation he has been admitted to the hospital and treated with IV steroids followed by oral steroids, antibiotic with cefepime, overnight on BiPAP and currently weaned to nasal cannula.  Respiratory panel for COVID-19 negative.  Plan: Continue to monitor in the hospital given severity of symptoms.   Aggressive bronchodilator therapy, IV steroids, change to oral steroids.  Inhalational steroids, scheduled and as needed bronchodilators, deep breathing exercises, incentive spirometry, chest physiotherapy and respiratory therapy consult. Started on cefepime for right base opacity and COPD exacerbation. Supplemental oxygen to keep saturations more than 92%. Uses CPAP at night, will use BiPAP in the hospital.  Called and discussed with pulmonary team for consultation. He does have CPAP at home, may benefit with BiPAP, will defer to pulmonary. Patient has severe symptoms and need to stay in the hospital and anticipate hospitalization more than 2 midnights.

## 2020-10-23 NOTE — ED Notes (Signed)
Dinner Tray Ordered @ 1651. 

## 2020-10-23 NOTE — H&P (Addendum)
History and Physical   Kenneth [name] JAS:[id number] DOB: October 03, 1960 DOA: 10/22/2020  PCP: [name], MD   Patient coming from: Home  Chief Complaint: Shortness of breath  HPI: Kenneth [name] is a 60 y.o. male with medical history significant of CKD, CHF, alcohol use, cardiac arrest, COPD, OSA, diabetes, hypertension, hyperlipidemia, depression, paroxysmal A. fib, constipation who presents with acute onset respiratory distress and shortness of breath earlier this evening.  Patient states that his symptoms started around 8 PM this evening and worsened quickly.  He called EMS who upon their arrival found patient in tripod position satting 80%.  He was placed on BiPAP and his sats improved to 97%.  He is 2L home oxygen at baseline, which he was on when his symptoms started. He denies fevers, chills, chest pain, abdominal pain, nausea, vomiting, diarrhea.  Of note, patient recently admitted from 1/12-1/22 after having an out of hospital PEA arrest in which ROSC was achieved within about 5 minutes of CPR and a dose of epi.  This was suspected to be due to severe COPD exacerbation.  He also was noted to have bilateral pneumonia during that admission.  ED Course: Vital signs in the ED significant for requiring BiPAP to maintain saturations and comfort, initially heart rate elevated though not verified in the 100s but this has been in the 70s to 80s since.  Blood pressure in the 419F to 790W systolic.  Lab work-up showed CMP with chloride ninety, creatinine of 2.03 up from a baseline of around 1.3, glucose 528.  Protein 5.9, albumin 3.4, AST mildly elevated to forty-five.  CBC showed hemoglobin stable at 9.7.  Initial troponin thirty with repeat pending.  Lactic acid initially 2.2 with repeat pending.  BNP pending.  VBG showed pH 7.4 with PCO2 of sixty-four.  Respiratory panel for Covid negative.  Chest x-ray showed developing opacity at the right base which is new compared to an x-ray x-ray  performed earlier today.  Patient was given steroids, breathing treatments, antibiotics in ED.  Review of Systems: As per HPI otherwise all other systems reviewed and are negative.  Past Medical History:  Diagnosis Date  . Anxiety   . Arthritis   . Atrial fibrillation ([place])   . Chest tightness 06/12/2019  . CHF (congestive heart failure) ([place])   . COPD (chronic obstructive pulmonary disease) (HCC)    emphysema  . Depression   . Diabetes mellitus without complication ([place])   . Heart murmur   . Hyperlipidemia   . Hypertension   . Sleep apnea with use of continuous positive airway pressure (CPAP)     Past Surgical History:  Procedure Laterality Date  . NO PAST SURGERIES      Social History  reports that he quit smoking about 17 months ago. His smoking use included cigarettes. He has a 60.00 pack-year smoking history. He has never used smokeless tobacco. He reports current alcohol use. He reports current drug use. Drug: Marijuana.  Allergies  Allergen Reactions  . Lisinopril Swelling  . Other Other (See Comments)    Lettuce : rash  . Tomato Rash    Family History  Problem Relation Age of Onset  . Diabetes Mother   . Heart disease Mother   . Diabetes Father   . Heart disease Father   . Diabetes Sister   . Heart disease Sister   . Kidney disease Sister   . Coronary artery disease Brother   . Liver disease Maternal Uncle   Reviewed on admission  Prior to Admission medications   Medication Sig Start Date End Date Taking? Authorizing Provider  acetaminophen (TYLENOL) 325 MG tablet Take 650 mg by mouth every 6 (six) hours as needed for mild pain or headache.    [provider]  albuterol (PROVENTIL) (2.5 MG/3ML) 0.083% nebulizer solution INHALE 1 VIAL VIA NEBULIZER 5 TIMES DAILY AS NEEDED FOR WHEEZING OR FOR SHORTNESS OF BREATH 10/13/20   Debbe Odea, MD  ARIPiprazole (ABILIFY) 5 MG tablet Take 5 mg by mouth daily. 08/24/20   [provider]  Blood  Glucose Monitoring Suppl (ONE TOUCH ULTRA 2) w/Device KIT Use to test blood sugars 1-2 times daily. 04/02/20   [name], MD  busPIRone (BUSPAR) 15 MG tablet Take 1 tablet (15 mg total) by mouth 2 (two) times daily. 09/06/19   [name], MD  carvedilol (COREG) 12.5 MG tablet Take 1 tablet (12.5 mg total) by mouth 2 (two) times daily with a meal. 04/23/20   Buford Dresser, MD  DM-GG & DM-APAP-CPM (CORICIDIN HBP DAY/NIGHT COLD) 10-20 &15-200-2 MG MISC Take 1 tablet by mouth every 12 (twelve) hours as needed (cold symptoms).    [provider]  ELIQUIS 5 MG TABS tablet TAKE ONE TABLET BY MOUTH EVERY MORNING and TAKE ONE TABLET BY MOUTH EVERY EVENING 10/22/20   Buford Dresser, MD  famotidine (PEPCID) 20 MG tablet Take 20 mg by mouth daily.    [provider]  FLUoxetine (PROZAC) 20 MG capsule TAKE 3 CAPSULES BY MOUTH  DAILY Patient taking differently: Take 60 mg by mouth daily. 05/26/19   [name], MD  Fluticasone-Umeclidin-Vilant (TRELEGY ELLIPTA) 100-62.5-25 MCG/INH AEPB Inhale 1 puff into the lungs daily. 04/02/20   Collene Gobble, MD  furosemide (LASIX) 40 MG tablet Take 1 tablet (40 mg total) by mouth 2 (two) times daily. 08/02/20   Buford Dresser, MD  gabapentin (NEURONTIN) 300 MG capsule TAKE ONE CAPSULE BY MOUTH THREE TIMES DAILY Patient taking differently: Take 300 mg by mouth 3 (three) times daily. 08/03/20   [name], MD  glucose blood [facility] ULTRA) test strip USE TO TEST BLOOD SUGAR 1-2 TIMES DAILY 05/22/20   [name], MD  guaiFENesin (MUCINEX) 600 MG 12 hr tablet Take 1 tablet (600 mg total) by mouth 2 (two) times daily. 09/24/20   Regalado, Belkys A, MD  ipratropium-albuterol (DUONEB) 0.5-2.5 (3) MG/3ML SOLN Take 3 mLs by nebulization every 6 (six) hours as needed. 09/24/20   Regalado, Belkys A, MD  isosorbide mononitrate (IMDUR) 30 MG 24 hr tablet Take 30 mg by mouth daily. 07/04/20    [provider]  Lancets [facility] ULTRASOFT) lancets USE TO TEST BLOOD SUGAR 1-2 TIMES DAILY 05/22/20   [name], MD  lubiprostone [facility]) 8 MCG capsule Take 1 capsule (8 mcg total) by mouth 2 (two) times daily with a meal. 10/22/20   [name], MD  Multiple Vitamin (MULTIVITAMIN WITH MINERALS) TABS tablet Take 1 tablet by mouth daily.    [provider]  nitroGLYCERIN (NITROSTAT) 0.4 MG SL tablet DISSOLVE 1 TABLET UNDER THE TONGUE EVERY 5 MINUTES AS&nbsp;&nbsp;NEEDED FOR CHEST PAIN. MAX&nbsp;&nbsp;OF 3 TABLETS IN 15 MINUTES. CALL 911 IF PAIN PERSISTS. Patient taking differently: Place 0.4 mg under the tongue every 5 (five) minutes as needed for chest pain. 07/25/20   [name], MD  pantoprazole (PROTONIX) 40 MG tablet TAKE ONE TABLET BY MOUTH EVERY MORNING Patient taking differently: Take 40 mg by mouth daily. 08/03/20   Esterwood,  Amy S, PA-C  predniSONE (DELTASONE) 10 MG tablet Take 1 tablet (10 mg total) by mouth daily. Take 40 mg x 1 day, 30 mg x 3 days, 20 mg x 3 days and then 10 mg daily- do not stop until your doctor tells you to. 10/13/20   Debbe Odea, MD  predniSONE (DELTASONE) 10 MG tablet Take 1 tablet (10 mg total) by mouth daily with breakfast. 10/22/20   Parrett, Fonnie Mu, NP  Roflumilast (DALIRESP) 250 MCG TABS Take 250 mcg by mouth daily. 10/13/20   Debbe Odea, MD  rosuvastatin (CRESTOR) 20 MG tablet Take 1 tablet (20 mg total) by mouth daily. 08/27/20   Dutch Quint B, FNP  traZODone (DESYREL) 50 MG tablet TAKE 1/2 TO 1 TABLET BY  MOUTH AT BEDTIME AS NEEDED  FOR SLEEP. Patient taking differently: Take 25-50 mg by mouth at bedtime as needed for sleep. 05/26/19   [name], MD    Physical Exam: Vitals:   10/22/20 2247 10/22/20 2248 10/22/20 2330 10/23/20 0015  BP: (!) 163/99  (!) 158/84 (!) 173/96  Pulse: (!) 121  88 90  Resp:   19 18  Temp: 97.7 F (36.5 C)     TempSrc: Axillary     SpO2: 100%  100% 100%   Weight:  87.8 kg    Height:  6' (1.829 m)     Physical Exam Constitutional:      General: He is not in acute distress.    Comments: Mild increased work of breathing on 2.5 L nasal cannula when seen  HENT:     Head: Normocephalic and atraumatic.     Mouth/Throat:     Mouth: Mucous membranes are moist.     Pharynx: Oropharynx is clear.  Eyes:     Extraocular Movements: Extraocular movements intact.     Pupils: Pupils are equal, round, and reactive to light.  Cardiovascular:     Rate and Rhythm: Normal rate and regular rhythm.     Pulses: Normal pulses.     Heart sounds: Normal heart sounds.  Pulmonary:     Effort: Pulmonary effort is normal. No respiratory distress.     Breath sounds: Wheezing and rhonchi present.  Abdominal:     General: Bowel sounds are normal. There is no distension.     Palpations: Abdomen is soft.     Tenderness: There is no abdominal tenderness.  Musculoskeletal:        General: No swelling or deformity.     Right lower leg: No edema.     Left lower leg: No edema.  Skin:    General: Skin is warm and dry.  Neurological:     General: No focal deficit present.     Mental Status: Mental status is at baseline.    Labs on Admission: I have personally reviewed following labs and imaging studies  CBC: Recent Labs  Lab 10/22/20 2301 10/22/20 2314  WBC 8.9  --   HGB 9.7* 11.6*  HCT 33.4* 34.0*  MCV 93.6  --   PLT 339  --     Basic Metabolic Panel: Recent Labs  Lab 10/22/20 2301 10/22/20 2314  NA 135 134*  K 4.4 4.3  CL 90*  --   CO2 29  --   GLUCOSE 528*  --   BUN 11  --   CREATININE 2.03*  --   CALCIUM 8.9  --     GFR: Estimated Creatinine Clearance: 43 mL/min (A) (by C-G formula based on  SCr of 2.03 mg/dL (H)).  Liver Function Tests: Recent Labs  Lab 10/22/20 2301  AST 45*  ALT 34  ALKPHOS 127*  BILITOT 0.6  PROT 5.9*  ALBUMIN 3.4*    Urine analysis:    Component Value Date/Time   COLORURINE AMBER (A) 10/03/2020 2036    APPEARANCEUR CLOUDY (A) 10/03/2020 2036   LABSPEC 1.016 10/03/2020 2036   PHURINE 6.0 10/03/2020 2036   GLUCOSEU >=500 (A) 10/03/2020 2036   GLUCOSEU NEGATIVE 05/20/2019 0825   HGBUR NEGATIVE 10/03/2020 2036   BILIRUBINUR NEGATIVE 10/03/2020 2036   KETONESUR NEGATIVE 10/03/2020 2036   PROTEINUR >=300 (A) 10/03/2020 2036   UROBILINOGEN 1.0 05/20/2019 0825   NITRITE NEGATIVE 10/03/2020 2036   LEUKOCYTESUR NEGATIVE 10/03/2020 2036    Radiological Exams on Admission: DG Chest 2 View  Result Date: 10/22/2020 CLINICAL DATA:  COPD, follow-up EXAM: CHEST - 2 VIEW COMPARISON:  10/10/2020 FINDINGS: Background changes of COPD. Improved aeration at the lung bases. No pleural effusion. No pneumothorax. Cardiomediastinal contours are within normal limits. No acute osseous abnormality. IMPRESSION: Improved aeration at the lung bases since 10/10/2020. Electronically Signed   By: Macy Mis M.D.   On: 10/22/2020 11:21   DG Chest Port 1 View  Result Date: 10/22/2020 CLINICAL DATA:  Shortness of breath. EXAM: PORTABLE CHEST 1 VIEW COMPARISON:  Radiograph earlier today.  Chest CT 10/03/2020 FINDINGS: Emphysema with chronic hyperinflation. Developing interstitial opacities at the right lung base from earlier today. Stable heart size and mediastinal contours, aortic atherosclerosis. No pleural effusion or pneumothorax. No acute osseous abnormalities are seen. IMPRESSION: 1. Developing interstitial opacities at the right lung base from earlier today, suspicious for pneumonia. Asymmetric pulmonary edema is felt less likely given distribution. 2. Emphysema and chronic hyperinflation. Electronically Signed   By: Keith Rake M.D.   On: 10/22/2020 23:17   EKG: Independently reviewed.  Atrial fibrillation at 123 bpm.  Evidence of LVH.  Assessment/Plan Principal Problem:   Acute on chronic respiratory failure with hypoxia (HCC) Active Problems:   Essential hypertension   Controlled type 2 diabetes  mellitus without complication, without long-term current use of insulin (HCC)   Depression   PAF (paroxysmal atrial fibrillation) (HCC)   Chronic combined systolic and diastolic heart failure (HCC)   Gastroesophageal reflux disease without esophagitis   Anticoagulant long-term use   Chronic constipation   Hyperlipidemia   Obstructive sleep apnea   COPD with acute exacerbation (HCC)   Acute kidney injury superimposed on CKD (HCC)  COPD OSA on CPAP Acute on chronic hypoxic respiratory failure > Patient with a history of COPD with recent exacerbation earlier this month which was likely the cause of his out-of-hospital PEA arrest. > Had acute onset respiratory distress around 8 PM this evening, initially hypoxic to the 80s with improvement when placed on BiPAP, remaied on BiPAP in the ED, but on nasal cannula when seen by me. > Chest x-ray noted to have a developing opacity at the right base which is new from a chest x-ray done earlier in the day at pulmonologist office. > Story somewhat suspicious for flash pulm edema, but no signs of this on evaluation here > Given a dose of Solu-Medrol, DuoNeb and cefepime in the ED - Admit to progressive - Continue scheduled duo nebs - As needed albuterol nebs - Prednisone 40 mg daily (appears he may be on steroids chronically outpatient) - Continue home Trelegy and Roflumilast - As needed BiPAP - Given a dose of cefepime in the ED, will continue  and check procalcitonin  CHF > Last echo was on 1/13 during recent admission, EF 79-15%, diastolic parameters indeterminate, normal RV function. - Continue home Coreg, Imdur - Holding off on Lasix given AKI and pending BNP - We will give gentle fluids overnight as he has no signs of volume overload - Follow-up BNP  AKI on CKD > Creatinine elevated to 2.03 from a baseline of around 1.3 > Does have mild lactic acid elevation at 2.2 this is not significantly elevated. - We will give gentle fluids overnight  as above, 75cc/hr. - Avoid nephrotoxic agents - Trend renal function and electrolytes  Paroxysmal A. Fib > Heart rate initially elevated to 123 in A. fib but this has improved while in the ED - Continue Coreg and Eliquis  Diabetes > Glucose initially elevated to 528 on admission, did receive 10 units of insulin for this > States his oral Metformin was discontinued after his last admission, may need to consider restarting this if elevated glucose is not solely due to steroid administration. - SSI  Hypertension - Continue Imdur and Coreg as above  GERD - Continue home PPI  Hyperlipidemia - Continue home statin  Depression - Continue home BuSpar, fluoxetine, Abilify   Constipation  - Continue home lubiprostone  History of PEA arrest > See HPI for review and prior records for details  DVT prophylaxis: Eliquis  Code Status:   Full  Family Communication:  None on admission Disposition Plan:   Patient is from:  Home  Anticipated DC to:  Home  Anticipated DC date:  1 to 5 days  Anticipated DC barriers: None  Consults called:  None  Admission status:  Observation, progressive   Severity of Illness: The appropriate patient status for this patient is OBSERVATION. Observation status is judged to be reasonable and necessary in order to provide the required intensity of service to ensure the patient's safety. The patient's presenting symptoms, physical exam findings, and initial radiographic and laboratory data in the context of their medical condition is felt to place them at decreased risk for further clinical deterioration. Furthermore, it is anticipated that the patient will be medically stable for discharge from the hospital within 2 midnights of admission. The following factors support the patient status of observation.   " The patient's presenting symptoms include shortness of breath. " The physical exam findings include wheezing and rhonchi, mild increased work of  breathing. " The initial radiographic and laboratory data are concerning for chronic elevated PCO2 at sixty-four, troponin initially thirty with repeat pending, lactic acid 2.2 with repeat pending, hemoglobin 9.7 this is stable.  Creatinine elevated 2.03 from baseline 1.3.  Glucose 528.  Chest x-ray with developing opacity at the right base which is new from x-ray earlier in the day.   Marcelyn Bruins MD [facility]  How to contact the [facility] Attending or Consulting provider [place] or covering provider during after hours [place], for this patient?   1. Check the care team in [facility] and look for a) attending/consulting [facility] provider listed and b) the [facility] team listed 2. Log into [email or web address] and use [place]'s universal password to access. If you do not have the password, please contact the hospital operator. 3. Locate the [facility] provider you are looking for under [facility] and page to a number that you can be directly reached. 4. If you still have difficulty reaching the provider, please page the [facility] (Director on Call) for the Hospitalists listed on amion for assistance.  10/23/2020, 1:21 AM

## 2020-10-23 NOTE — ED Notes (Signed)
SDU Breakfast order placed 

## 2020-10-23 NOTE — ED Notes (Signed)
Patient ambulated to and from restroom with minimal assistance. Patient placed back on monitor.

## 2020-10-23 NOTE — Patient Outreach (Signed)
[facility]) Care Management  10/23/2020  Merritt Mccravy 1961/01/14 [id number]   Noted that member presented again to ED yesterday with COPD exacerbation, currently in the Ed awaiting admission.  Call placed to [facility] to follow up on referral.  Notified by Revonda Standard that member had schedule home visit this morning to enroll in program.  Advised her that member is currently at the hospital.  She will have their liaison follow up and plan to enroll once member is discharged.  [facility] hospital liaisons notified and updated on plan of care.    [name], [place], MSN [facility] Care Management  [facility] Hospital Manager [phone]

## 2020-10-23 NOTE — Progress Notes (Signed)
Pt. Placed on 2.5 L [place] at this time, per pt./ 2.5L is what he wears at home. VSS at this time, RT will continue to monitor

## 2020-10-23 NOTE — Consult Note (Addendum)
NAME:  Kenneth [name], MRN:  [id number], DOB:  07-08-1961, LOS: 0 ADMISSION DATE:  10/22/2020, CONSULTATION DATE:  2/1 REFERRING MD:  Sheppard Coil, CHIEF COMPLAINT:  Acute on chronic respiratory failure    Brief History:  This is a 60 year old male with chronic hypoxic respiratory failure in the setting of fairly advanced chronic obstructive pulmonary disease further complicated by history of systolic and diastolic heart failure.  Admitted 2/1 with acute decompensated heart failure pulmonary asked to evaluate to make further recommendations to maximize pulmonary regimen  History of Present Illness:  60 year old male patient with chronic respiratory failure with fairly significant cardiopulmonary disease just discharged from the hospital on 1/22 after being admitted status post cardiopulmonary arrest felt secondary to decompensated combined systolic/diastolic heart failure. Has a history of fairly significant chronic obstructive pulmonary disease as well, requiring supplemental oxygen between 2 to 3 L. Also has history of obstructive sleep apnea. Since his dc had been slowly feeling better.  Review from our clinic note shows that his CPAP download he was only having about 50% compliance.  He apparently had a fairly busy day on 1/31 having several doctors appointments during that day.  He was actually in his usual health at home then when getting ready to go to bed during the evening hours he developed acute onset of shortness of breath.  He first attempted rescue bronchodilator with no relief, he then placed his CPAP. He developed fairly rapid worsening of shortness of breath because of this and EMS was called. He was placed on CPAP and transferred to the ER. In ER interventions included:  BIPAP IV solumedrol Bds transitioned to oral pred In ER dx eval New bilateral infiltrates. New acute on chronic renal failure (BL cr 1.3 2.03).   Past Medical History:  Chronic hypoxic respiratory failure in the  setting of COPD, also has component of reactive airway disease/asthmatic component. Obstructive sleep apnea, multiple hospital admissions most recent for PEA cardiac arrest. He was just discharged on 10/13/2020. History of pansensitive Pseudomonas tracheobronchitis.History of atrial fibrillation on anticoagulation. History of HFpEF, last ejection fraction 55 to 60%, probable diastolic dysfunction. Hyperlipidemia, hypertension, depression. Diabetes type 2  Significant Hospital Events:   Consults:    Procedures:    Significant Diagnostic Tests:    Micro Data:  SARS coronavirus 2: Negative   Antimicrobials:  Cefepime 1/31  Interim History / Subjective:  Feels much better Objective   Blood pressure (Abnormal) 159/83, pulse 74, temperature 98.3 F (36.8 C), temperature source Oral, resp. rate 17, height 6' (1.829 m), weight 87.8 kg, SpO2 100 %.    Vent Mode: BIPAP;PCV FiO2 (%):  [40 %-100 %] 40 % Set Rate:  [15 bmp] 15 bmp PEEP:  [5 cmH20] 5 cmH20  No intake or output data in the 24 hours ending 10/23/20 1258 Filed Weights   10/22/20 2248  Weight: 87.8 kg    Examination: General: Chronically ill-appearing 60 year old male lying in bed currently no acute distress on 2 L via nasal cannula HENT:normocephalic atraumatic no JVD mucous membranes moist Lungs: Prolonged faint posterior expiratory wheeze.  No rhonchi or rales.  No accessory use.  Speaking in full sentences. Cardiovascular: Regular rate and rhythm Abdomen: Soft nontender Extremities: Does have pitting edema in both lower extremities pulses are palpable Neuro: Awake oriented GU: Voids  Resolved Hospital Problem list     Assessment & Plan:    Acute on chronic hypoxic respiratory failure in the setting of acute diastolic heart failure with pulmonary edema further complicated by  underlying chronic obstructive pulmonary disease -Had rather acute decompensation BNP was > 1700. This is NOT C/W AECOPD -Has had only 50%  compliance per CPAP downloads, this could certainly be leading to increased cardiac demand -Portable chest x-ray personally reviewed shows cardiomegaly and bilateral/slightly asymmetrical pulmonary infiltrates  -Already feels close to baseline Plan Continue supplemental oxygen Rapid prednisone taper, I do not think this is COPD exacerbation He has been placed on cefepime, not convinced he is infected, repeat procalcitonin would consider discontinuing antibiotics IV Lasix x1 KVO fluids Continue scheduled bronchodilators We will need to check arterial blood gas close to discharge, I wonder if he would benefit from BiPAP instead of CPAP of course first would be ideal to ensure he is compliant with this We will ask heart failure to see him  Obstructive sleep apnea CPAP download showing only about 50% compliance This could certainly be contributing to his diastolic dysfunction Plan Continue to encourage compliance  Acute diastolic heart failure with pulmonary edema Hypertension HL Plan Continue Imdur, Crestor, Coreg Lasix as mentioned above  History of paroxysmal atrial fibrillation currently normal sinus Plan Telemetry Beta-blocker and DOAC  Acute on chronic renal failure CKD stage III Baseline cr 1.5 to 1.7 Plan  Renal dose meds IV lasix x1 Am chemistry   Diabetes type II w/ hyperglycemia Plan ssi   H/o GERD Plan PPI  H/o depression Plan Cont home meds  Best practice (evaluated daily)  Per primary     Labs   CBC: Recent Labs  Lab 10/22/20 2301 10/22/20 2314 10/23/20 0417  WBC 8.9  --  11.6*  HGB 9.7* 11.6* 9.2*  HCT 33.4* 34.0* 31.6*  MCV 93.6  --  94.9  PLT 339  --  737    Basic Metabolic Panel: Recent Labs  Lab 10/22/20 2301 10/22/20 2314 10/23/20 0105  NA 135 134* 136  K 4.4 4.3 4.4  CL 90*  --  90*  CO2 29  --  36*  GLUCOSE 528*  --  404*  BUN 11  --  9  CREATININE 2.03*  --  1.84*  CALCIUM 8.9  --  9.0   GFR: Estimated Creatinine  Clearance: 47.4 mL/min (A) (by C-G formula based on SCr of 1.84 mg/dL (H)). Recent Labs  Lab 10/22/20 2301 10/22/20 2326 10/23/20 0105 10/23/20 0417  PROCALCITON  --   --  <0.10 <0.10  WBC 8.9  --   --  11.6*  LATICACIDVEN  --  2.2*  --   --     Liver Function Tests: Recent Labs  Lab 10/22/20 2301 10/23/20 0105  AST 45* 32  ALT 34 34  ALKPHOS 127* 138*  BILITOT 0.6 0.8  PROT 5.9* 6.2*  ALBUMIN 3.4* 3.4*   No results for input(s): LIPASE, AMYLASE in the last 168 hours. No results for input(s): AMMONIA in the last 168 hours.  ABG    Component Value Date/Time   PHART 7.349 (L) 10/06/2020 0736   PCO2ART 58.5 (H) 10/06/2020 0736   PO2ART 53 (L) 10/06/2020 0736   HCO3 40.1 (H) 10/22/2020 2314   TCO2 42 (H) 10/22/2020 2314   ACIDBASEDEF 1.0 06/12/2019 1347   O2SAT 99.0 10/22/2020 2314     Coagulation Profile: No results for input(s): INR, PROTIME in the last 168 hours.  Cardiac Enzymes: No results for input(s): CKTOTAL, CKMB, CKMBINDEX, TROPONINI in the last 168 hours.  HbA1C: Hgb A1c MFr Bld  Date/Time Value Ref Range Status  09/13/2020 04:08 AM 6.3 (H) 4.8 - 5.6 %  Final    Comment:    (NOTE) Pre diabetes:          5.7%-6.4%  Diabetes:              >6.4%  Glycemic control for   <7.0% adults with diabetes   06/21/2020 09:14 AM 6.8 (H) 4.8 - 5.6 % Final    Comment:    (NOTE) Pre diabetes:          5.7%-6.4%  Diabetes:              >6.4%  Glycemic control for   <7.0% adults with diabetes     CBG: Recent Labs  Lab 10/23/20 0223 10/23/20 0752 10/23/20 1215  GLUCAP 148* 267* 201*    Review of Systems:   Review of Systems  Constitutional: Positive for diaphoresis and malaise/fatigue.  HENT: Negative.   Eyes: Negative.   Respiratory: Positive for shortness of breath.   Cardiovascular: Positive for leg swelling.  Gastrointestinal: Negative.   Genitourinary: Negative.   Musculoskeletal: Negative.   Neurological: Negative.    Endo/Heme/Allergies: Negative.   Psychiatric/Behavioral: Negative.      Past Medical History:  He,  has a past medical history of Anxiety, Arthritis, Atrial fibrillation ([place]), Chest tightness (06/12/2019), CHF (congestive heart failure) ([place]), COPD (chronic obstructive pulmonary disease) ([place]), Depression, Diabetes mellitus without complication ([place]), Heart murmur, Hyperlipidemia, Hypertension, and Sleep apnea with use of continuous positive airway pressure (CPAP).   Surgical History:   Past Surgical History:  Procedure Laterality Date  . NO PAST SURGERIES       Social History:   reports that he quit smoking about 17 months ago. His smoking use included cigarettes. He has a 60.00 pack-year smoking history. He has never used smokeless tobacco. He reports current alcohol use. He reports current drug use. Drug: Marijuana.   Family History:  His family history includes Coronary artery disease in his brother; Diabetes in his father, mother, and sister; Heart disease in his father, mother, and sister; Kidney disease in his sister; Liver disease in his maternal uncle.   Allergies Allergies  Allergen Reactions  . Lisinopril Swelling  . Other Other (See Comments)    Lettuce : rash  . Tomato Rash     Home Medications  Prior to Admission medications   Medication Sig Start Date End Date Taking? Authorizing Provider  acetaminophen (TYLENOL) 325 MG tablet Take 650 mg by mouth every 6 (six) hours as needed for mild pain or headache.    [provider]  albuterol (PROVENTIL) (2.5 MG/3ML) 0.083% nebulizer solution INHALE 1 VIAL VIA NEBULIZER 5 TIMES DAILY AS NEEDED FOR WHEEZING OR FOR SHORTNESS OF BREATH 10/13/20   Debbe Odea, MD  ARIPiprazole (ABILIFY) 5 MG tablet Take 5 mg by mouth daily. 08/24/20   [provider]  Blood Glucose Monitoring Suppl (ONE TOUCH ULTRA 2) w/Device KIT Use to test blood sugars 1-2 times daily. 04/02/20   [name], MD  busPIRone  (BUSPAR) 15 MG tablet Take 1 tablet (15 mg total) by mouth 2 (two) times daily. 09/06/19   [name], MD  carvedilol (COREG) 12.5 MG tablet Take 1 tablet (12.5 mg total) by mouth 2 (two) times daily with a meal. 04/23/20   Buford Dresser, MD  DM-GG & DM-APAP-CPM (CORICIDIN HBP DAY/NIGHT COLD) 10-20 &15-200-2 MG MISC Take 1 tablet by mouth every 12 (twelve) hours as needed (cold symptoms).    [provider]  ELIQUIS 5 MG TABS tablet TAKE ONE TABLET BY  MOUTH EVERY MORNING and TAKE ONE TABLET BY MOUTH EVERY EVENING 10/22/20   Buford Dresser, MD  famotidine (PEPCID) 20 MG tablet Take 20 mg by mouth daily.    [provider]  FLUoxetine (PROZAC) 20 MG capsule TAKE 3 CAPSULES BY MOUTH  DAILY Patient taking differently: Take 60 mg by mouth daily. 05/26/19   [name], MD  Fluticasone-Umeclidin-Vilant (TRELEGY ELLIPTA) 100-62.5-25 MCG/INH AEPB Inhale 1 puff into the lungs daily. 04/02/20   Collene Gobble, MD  furosemide (LASIX) 40 MG tablet Take 1 tablet (40 mg total) by mouth 2 (two) times daily. 08/02/20   Buford Dresser, MD  gabapentin (NEURONTIN) 300 MG capsule TAKE ONE CAPSULE BY MOUTH THREE TIMES DAILY Patient taking differently: Take 300 mg by mouth 3 (three) times daily. 08/03/20   [name], MD  glucose blood [facility] ULTRA) test strip USE TO TEST BLOOD SUGAR 1-2 TIMES DAILY 05/22/20   [name], MD  guaiFENesin (MUCINEX) 600 MG 12 hr tablet Take 1 tablet (600 mg total) by mouth 2 (two) times daily. 09/24/20   Regalado, Belkys A, MD  ipratropium-albuterol (DUONEB) 0.5-2.5 (3) MG/3ML SOLN Take 3 mLs by nebulization every 6 (six) hours as needed. 09/24/20   Regalado, Belkys A, MD  isosorbide mononitrate (IMDUR) 30 MG 24 hr tablet Take 30 mg by mouth daily. 07/04/20   [provider]  Lancets [facility] ULTRASOFT) lancets USE TO TEST BLOOD SUGAR 1-2 TIMES DAILY 05/22/20   [name], MD   lubiprostone [facility]) 8 MCG capsule Take 1 capsule (8 mcg total) by mouth 2 (two) times daily with a meal. 10/22/20   [name], MD  Multiple Vitamin (MULTIVITAMIN WITH MINERALS) TABS tablet Take 1 tablet by mouth daily.    [provider]  nitroGLYCERIN (NITROSTAT) 0.4 MG SL tablet DISSOLVE 1 TABLET UNDER THE TONGUE EVERY 5 MINUTES AS&nbsp;&nbsp;NEEDED FOR CHEST PAIN. MAX&nbsp;&nbsp;OF 3 TABLETS IN 15 MINUTES. CALL 911 IF PAIN PERSISTS. Patient taking differently: Place 0.4 mg under the tongue every 5 (five) minutes as needed for chest pain. 07/25/20   [name], MD  pantoprazole (PROTONIX) 40 MG tablet TAKE ONE TABLET BY MOUTH EVERY MORNING Patient taking differently: Take 40 mg by mouth daily. 08/03/20   Esterwood, Amy S, PA-C  predniSONE (DELTASONE) 10 MG tablet Take 1 tablet (10 mg total) by mouth daily. Take 40 mg x 1 day, 30 mg x 3 days, 20 mg x 3 days and then 10 mg daily- do not stop until your doctor tells you to. 10/13/20   Debbe Odea, MD  predniSONE (DELTASONE) 10 MG tablet Take 1 tablet (10 mg total) by mouth daily with breakfast. 10/22/20   Parrett, Fonnie Mu, NP  Roflumilast (DALIRESP) 250 MCG TABS Take 250 mcg by mouth daily. 10/13/20   Debbe Odea, MD  rosuvastatin (CRESTOR) 20 MG tablet Take 1 tablet (20 mg total) by mouth daily. 08/27/20   Dutch Quint B, FNP  traZODone (DESYREL) 50 MG tablet TAKE 1/2 TO 1 TABLET BY  MOUTH AT BEDTIME AS NEEDED  FOR SLEEP. Patient taking differently: Take 25-50 mg by mouth at bedtime as needed for sleep. 05/26/19   [name], MD     Critical care time: NA   Erick Colace ACNP-BC [place] Pager # [phone] OR # [phone] if no answer

## 2020-10-23 NOTE — ED Notes (Signed)
Lunch Tray Ordered @ 1034. 

## 2020-10-24 DIAGNOSIS — I5042 Chronic combined systolic (congestive) and diastolic (congestive) heart failure: Secondary | ICD-10-CM | POA: Diagnosis not present

## 2020-10-24 DIAGNOSIS — N179 Acute kidney failure, unspecified: Secondary | ICD-10-CM | POA: Diagnosis not present

## 2020-10-24 DIAGNOSIS — J9621 Acute and chronic respiratory failure with hypoxia: Secondary | ICD-10-CM | POA: Diagnosis not present

## 2020-10-24 DIAGNOSIS — Z7901 Long term (current) use of anticoagulants: Secondary | ICD-10-CM | POA: Diagnosis not present

## 2020-10-24 DIAGNOSIS — I5043 Acute on chronic combined systolic (congestive) and diastolic (congestive) heart failure: Secondary | ICD-10-CM

## 2020-10-24 LAB — BASIC METABOLIC PANEL
Anion gap: 12 (ref 5–15)
BUN: 16 mg/dL (ref 6–20)
CO2: 35 mmol/L — ABNORMAL HIGH (ref 22–32)
Calcium: 8.9 mg/dL (ref 8.9–10.3)
Chloride: 93 mmol/L — ABNORMAL LOW (ref 98–111)
Creatinine, Ser: 1.25 mg/dL — ABNORMAL HIGH (ref 0.61–1.24)
GFR, Estimated: >60 mL/min (ref >60)
Glucose, Bld: 159 mg/dL — ABNORMAL HIGH (ref 70–99)
Potassium: 3.2 mmol/L — ABNORMAL LOW (ref 3.5–5.1)
Sodium: 140 mmol/L (ref 135–145)

## 2020-10-24 LAB — GLUCOSE, CAPILLARY
Glucose-Capillary: 69 mg/dL — ABNORMAL LOW (ref 70–99)
Glucose-Capillary: 352 mg/dL — ABNORMAL HIGH (ref 70–99)
Glucose-Capillary: 53 mg/dL — ABNORMAL LOW (ref 70–99)
Glucose-Capillary: 82 mg/dL (ref 70–99)

## 2020-10-24 LAB — CBC WITH DIFFERENTIAL/PLATELET
Abs Immature Granulocytes: 0.03 10*3/uL (ref 0.00–0.07)
Basophils Absolute: 0 10*3/uL (ref 0.0–0.1)
Basophils Relative: 0 %
Eosinophils Absolute: 0 10*3/uL (ref 0.0–0.5)
Eosinophils Relative: 0 %
HCT: 30 % — ABNORMAL LOW (ref 39.0–52.0)
Hemoglobin: 9.4 g/dL — ABNORMAL LOW (ref 13.0–17.0)
Immature Granulocytes: 0 %
Lymphocytes Relative: 10 %
Lymphs Abs: 1.1 10*3/uL (ref 0.7–4.0)
MCH: 28.7 pg (ref 26.0–34.0)
MCHC: 31.3 g/dL (ref 30.0–36.0)
MCV: 91.7 fL (ref 80.0–100.0)
Monocytes Absolute: 0.9 10*3/uL (ref 0.1–1.0)
Monocytes Relative: 7 %
Neutro Abs: 9.8 10*3/uL — ABNORMAL HIGH (ref 1.7–7.7)
Neutrophils Relative %: 83 %
Platelets: 260 10*3/uL (ref 150–400)
RBC: 3.27 MIL/uL — ABNORMAL LOW (ref 4.22–5.81)
RDW: 14.2 % (ref 11.5–15.5)
WBC: 11.8 10*3/uL — ABNORMAL HIGH (ref 4.0–10.5)
nRBC: 0 % (ref 0.0–0.2)

## 2020-10-24 LAB — MAGNESIUM: Magnesium: 1.8 mg/dL (ref 1.7–2.4)

## 2020-10-24 LAB — PROCALCITONIN: Procalcitonin: <0.1 ng/mL

## 2020-10-24 LAB — PHOSPHORUS: Phosphorus: 4.1 mg/dL (ref 2.5–4.6)

## 2020-10-24 MED ORDER — SPIRONOLACTONE 25 MG PO TABS: 25.0000 mg | ORAL_TABLET | Freq: Every day | ORAL | Status: DC

## 2020-10-24 MED ORDER — POTASSIUM CHLORIDE CRYS ER 20 MEQ PO TBCR: 40.0000 meq | EXTENDED_RELEASE_TABLET | Freq: Every day | ORAL | 0 refills | Status: DC

## 2020-10-24 MED ORDER — HYDRALAZINE HCL 25 MG PO TABS: 25.0000 mg | ORAL_TABLET | Freq: Three times a day (TID) | ORAL | 0 refills | Status: DC

## 2020-10-24 MED ORDER — PREDNISONE 10 MG PO TABS: 10.0000 mg | ORAL_TABLET | Freq: Every day | ORAL | Status: DC

## 2020-10-24 MED ORDER — FUROSEMIDE 40 MG PO TABS: 40.0000 mg | ORAL_TABLET | Freq: Two times a day (BID) | ORAL | Status: DC

## 2020-10-24 MED ORDER — POTASSIUM CHLORIDE CRYS ER 20 MEQ PO TBCR
40.0000 meq | EXTENDED_RELEASE_TABLET | Freq: Two times a day (BID) | ORAL | Status: DC
  Administered 2020-10-24: 40 meq via ORAL
  Filled 2020-10-24: qty 2

## 2020-10-24 MED ORDER — SPIRONOLACTONE 25 MG PO TABS: 25.0000 mg | ORAL_TABLET | Freq: Every day | ORAL | 0 refills | Status: DC

## 2020-10-24 MED ORDER — POTASSIUM CHLORIDE CRYS ER 20 MEQ PO TBCR: 40.0000 meq | EXTENDED_RELEASE_TABLET | Freq: Once | ORAL | Status: DC

## 2020-10-24 NOTE — Consult Note (Signed)
   [facility] Inpatient Consult   10/24/2020  Kenneth [name] November 29, 1960 [id number]   [facility] [[facility]]  Accountable Care Organization [ACO] Patient: [name]  Patient is currently active with [facility] [[facility]] Care Management for chronic disease management services.  Patient has been engaged by a [facility] Telephonic RN Care Coordinator as well as an Engineer, mining at [employer] .  Our community based plan of care has focused on disease management and community resource support.  Patient reviewed for hospital readmission in less than 30 days with extreme high score for unplanned readmission risk noted.  Patient had been referred to [facility] program with [facility] at last hospitalization and had planned follow up.  Plan: Will follow up with assigned Inpatient Transition Of Care [TOC] team member to make aware that [name] Care Management following.   Of note, [name] Care Management services does not replace or interfere with any services that are needed or arranged by inpatient [name] care management team.  For additional questions or referrals please contact:  [name], RN BSN CCM [facility]  [phone] business mobile phone Toll free office [phone]  Fax number: [phone] [email or web address]

## 2020-10-24 NOTE — Progress Notes (Signed)
DISCHARGE NOTE HOME Kenneth [name] to be discharged Home per MD order. Discussed prescriptions and follow up appointments with the patient. Prescriptions given to patient; medication list explained in detail. Patient verbalized understanding.  Skin clean, dry and intact without evidence of skin break down, no evidence of skin tears noted. IV catheter discontinued intact. Site without signs and symptoms of complications. Dressing and pressure applied. Pt denies pain at the site currently. No complaints noted.  Patient free of lines, drains, and wounds.   An After Visit Summary (AVS) was printed and given to the patient. Patient escorted via wheelchair, and discharged home via private auto.  [name], RN

## 2020-10-24 NOTE — Progress Notes (Signed)
NAME:  Kenneth [name], MRN:  [id number], DOB:  May 17, 1961, LOS: 1 ADMISSION DATE:  10/22/2020, CONSULTATION DATE:  2/1 REFERRING MD:  Lyn Hollingshead, CHIEF COMPLAINT:  Dyspnea   Brief History:  60 y/o male with severe COPD and chronic respiratory failure with hypoxemia presented with the sudden onset of dyspnea and hypoxemia on 2/1 requiring bipap.  Felt to have a CHF exacerbation as he improved rapidly with IV lasix.   Past Medical History:  COPD Chronic respiratory failure with hypoxemia osa PEA arrest in 09/2020 in setting of hypoxemia Pseudomonas colonized Atrial fib HFpEF last EF 55-60% hyperlipdemia Hypertension depression DM2  Significant Hospital Events:    Consults:  Cardiology  Pulmonology  Procedures:    Significant Diagnostic Tests:    Micro Data:  SARS coronavirus 2: Negative  Antimicrobials:   Cefepime 1/31 >  Interim History / Subjective:  Feels much better today Slept OK  Objective   Blood pressure (!) 161/72, pulse (!) 109, temperature 98.3 F (36.8 C), resp. rate 18, height 6' (1.829 m), weight 87.8 kg, SpO2 100 %.        Intake/Output Summary (Last 24 hours) at 10/24/2020 0851 Last data filed at 10/24/2020 0500 Gross per 24 hour  Intake 700 ml  Output 0 ml  Net 700 ml   Filed Weights   10/22/20 2248  Weight: 87.8 kg    Examination: General:  Resting comfortably in bed HENT: NCAT OP clear PULM: Improved air movement compared to yesterday, no crackles B, normal effort CV: RRR, no S3 today GI: BS+, soft, nontender MSK: normal bulk and tone Neuro: awake, alert, no distress, MAEW   Resolved Hospital Problem list     Assessment & Plan:  Severe COPD: I do not think he has an exacerbation of COPD I also don't think he has pneumonia I have adjusted his prednisone to his chronic home dose of 10mg  daily Continue Breo/Incruise in hospital Resume trelegy one puff daily at home duoneb as ordered here, at home needs to take it  tid roflumilast to continue Out of bed D/c cefepime Flutter to continue  Acute on chronic respiratory failure with hypoxemia: resolved Decrease O2 to 2L continuously as per home dose  Acute diastolic heart failure Appreciated cardiology recommendations Diuretics, coreg, nitrates per their recommendations  PCCM will sign off, call if questions Suspect he will be ready to discharge today or tomorrow depending on cardiology recommendations  Best practice (evaluated daily)   Per [facility]  Goals of Care:   Per [facility]  Labs   CBC: Recent Labs  Lab 10/22/20 2301 10/22/20 2314 10/23/20 0417 10/24/20 0228  WBC 8.9  --  11.6* 11.8*  NEUTROABS  --   --   --  9.8*  HGB 9.7* 11.6* 9.2* 9.4*  HCT 33.4* 34.0* 31.6* 30.0*  MCV 93.6  --  94.9 91.7  PLT 339  --  262 260    Basic Metabolic Panel: Recent Labs  Lab 10/22/20 2301 10/22/20 2314 10/23/20 0105 10/24/20 0228  NA 135 134* 136 140  K 4.4 4.3 4.4 3.2*  CL 90*  --  90* 93*  CO2 29  --  36* 35*  GLUCOSE 528*  --  404* 159*  BUN 11  --  9 16  CREATININE 2.03*  --  1.84* 1.25*  CALCIUM 8.9  --  9.0 8.9  MG  --   --   --  1.8  PHOS  --   --   --  4.1   GFR:  Estimated Creatinine Clearance: 69.8 mL/min (A) (by C-G formula based on SCr of 1.25 mg/dL (H)). Recent Labs  Lab 10/22/20 2301 10/22/20 2326 10/23/20 0105 10/23/20 0417 10/24/20 0228  PROCALCITON  --   --  <0.10 <0.10 <0.10  WBC 8.9  --   --  11.6* 11.8*  LATICACIDVEN  --  2.2*  --   --   --     Liver Function Tests: Recent Labs  Lab 10/22/20 2301 10/23/20 0105  AST 45* 32  ALT 34 34  ALKPHOS 127* 138*  BILITOT 0.6 0.8  PROT 5.9* 6.2*  ALBUMIN 3.4* 3.4*   No results for input(s): LIPASE, AMYLASE in the last 168 hours. No results for input(s): AMMONIA in the last 168 hours.  ABG    Component Value Date/Time   PHART 7.349 (L) 10/06/2020 0736   PCO2ART 58.5 (H) 10/06/2020 0736   PO2ART 53 (L) 10/06/2020 0736   HCO3 40.1 (H) 10/22/2020 2314   TCO2  42 (H) 10/22/2020 2314   ACIDBASEDEF 1.0 06/12/2019 1347   O2SAT 99.0 10/22/2020 2314     Coagulation Profile: No results for input(s): INR, PROTIME in the last 168 hours.  Cardiac Enzymes: No results for input(s): CKTOTAL, CKMB, CKMBINDEX, TROPONINI in the last 168 hours.  HbA1C: Hgb A1c MFr Bld  Date/Time Value Ref Range Status  09/13/2020 04:08 AM 6.3 (H) 4.8 - 5.6 % Final    Comment:    (NOTE) Pre diabetes:          5.7%-6.4%  Diabetes:              >6.4%  Glycemic control for   <7.0% adults with diabetes   06/21/2020 09:14 AM 6.8 (H) 4.8 - 5.6 % Final    Comment:    (NOTE) Pre diabetes:          5.7%-6.4%  Diabetes:              >6.4%  Glycemic control for   <7.0% adults with diabetes     CBG: Recent Labs  Lab 10/23/20 0223 10/23/20 0752 10/23/20 1215 10/23/20 1739 10/24/20 0801  GLUCAP 148* 267* 201* 379* 352*     Heber [place], MD [place] [facility] Pager: [phone] Cell: [phone] If no response, call [phone]

## 2020-10-24 NOTE — Progress Notes (Signed)
Progress Note  Patient Name: Kenneth [name] Date of Encounter: 10/24/2020  [facility] Cardiologist: Jodelle Red, MD   Subjective   The patient feels better today, improved SOB.  Inpatient Medications    Scheduled Meds: . apixaban  5 mg Oral BID  . ARIPiprazole  5 mg Oral Daily  . busPIRone  15 mg Oral BID  . carvedilol  12.5 mg Oral BID WC  . FLUoxetine  60 mg Oral Daily  . fluticasone furoate-vilanterol  1 puff Inhalation Daily   And  . umeclidinium bromide  1 puff Inhalation Daily  . furosemide  40 mg Intravenous BID  . gabapentin  300 mg Oral TID  . hydrALAZINE  25 mg Oral TID  . insulin aspart  0-20 Units Subcutaneous TID WC  . ipratropium-albuterol  3 mL Nebulization Once  . isosorbide mononitrate  30 mg Oral Daily  . lubiprostone  8 mcg Oral BID WC  . pantoprazole  40 mg Oral Daily  . potassium chloride  40 mEq Oral BID  . [START ON 10/25/2020] predniSONE  10 mg Oral Q breakfast  . roflumilast  250 mcg Oral Daily  . rosuvastatin  20 mg Oral Daily  . sodium chloride flush  3 mL Intravenous Q12H   Continuous Infusions:  PRN Meds: acetaminophen **OR** acetaminophen, albuterol, nitroGLYCERIN   Vital Signs    Vitals:   10/23/20 2200 10/24/20 0000 10/24/20 0119 10/24/20 0542  BP: (!) 165/83 (!) 148/84 (!) 170/79 (!) 161/72  Pulse: 74 73 76 (!) 109  Resp: 13 11 20 18   Temp:   97.7 F (36.5 C) 98.3 F (36.8 C)  TempSrc:   Oral   SpO2: 100% 100% 99% 100%  Weight:      Height:        Intake/Output Summary (Last 24 hours) at 10/24/2020 0932 Last data filed at 10/24/2020 0500 Gross per 24 hour  Intake 700 ml  Output 0 ml  Net 700 ml   Last 3 Weights 10/22/2020 10/22/2020 10/13/2020  Weight (lbs) 193 lb 9 oz 193 lb 9.6 oz 193 lb 4.8 oz  Weight (kg) 87.8 kg 87.816 kg 87.68 kg      Telemetry    SR - Personally Reviewed  ECG    No new tracing - Personally Reviewed  Physical Exam   GEN: No acute distress.   Neck: No JVD Cardiac: RRR, no  murmurs, rubs, or gallops.  Respiratory: Clear to auscultation bilaterally. GI: Soft, nontender, non-distended  MS: No edema; No deformity. Neuro:  Nonfocal  Psych: Normal affect   Labs    High Sensitivity Troponin:   Recent Labs  Lab 10/03/20 2020 10/03/20 2209 10/22/20 2301 10/23/20 0105  TROPONINIHS 44* 167* 30* 43*      Chemistry Recent Labs  Lab 10/22/20 2301 10/22/20 2314 10/23/20 0105 10/24/20 0228  NA 135 134* 136 140  K 4.4 4.3 4.4 3.2*  CL 90*  --  90* 93*  CO2 29  --  36* 35*  GLUCOSE 528*  --  404* 159*  BUN 11  --  9 16  CREATININE 2.03*  --  1.84* 1.25*  CALCIUM 8.9  --  9.0 8.9  PROT 5.9*  --  6.2*  --   ALBUMIN 3.4*  --  3.4*  --   AST 45*  --  32  --   ALT 34  --  34  --   ALKPHOS 127*  --  138*  --   BILITOT 0.6  --  0.8  --   GFRNONAA 37*  --  42* >60  ANIONGAP 16*  --  10 12     Hematology Recent Labs  Lab 10/22/20 2301 10/22/20 2314 10/23/20 0417 10/24/20 0228  WBC 8.9  --  11.6* 11.8*  RBC 3.57*  --  3.33* 3.27*  HGB 9.7* 11.6* 9.2* 9.4*  HCT 33.4* 34.0* 31.6* 30.0*  MCV 93.6  --  94.9 91.7  MCH 27.2  --  27.6 28.7  MCHC 29.0*  --  29.1* 31.3  RDW 13.7  --  13.8 14.2  PLT 339  --  262 260    BNP Recent Labs  Lab 10/23/20 0417  BNP 1,701.2*    DDimer No results for input(s): DDIMER in the last 168 hours.   Radiology    DG Chest 2 View  Result Date: 10/22/2020 CLINICAL DATA:  COPD, follow-up EXAM: CHEST - 2 VIEW COMPARISON:  10/10/2020 FINDINGS: Background changes of COPD. Improved aeration at the lung bases. No pleural effusion. No pneumothorax. Cardiomediastinal contours are within normal limits. No acute osseous abnormality. IMPRESSION: Improved aeration at the lung bases since 10/10/2020. Electronically Signed   By: Guadlupe Spanish M.D.   On: 10/22/2020 11:21   DG Chest Port 1 View  Result Date: 10/22/2020 CLINICAL DATA:  Shortness of breath. EXAM: PORTABLE CHEST 1 VIEW COMPARISON:  Radiograph earlier today.  Chest  CT 10/03/2020 FINDINGS: Emphysema with chronic hyperinflation. Developing interstitial opacities at the right lung base from earlier today. Stable heart size and mediastinal contours, aortic atherosclerosis. No pleural effusion or pneumothorax. No acute osseous abnormalities are seen. IMPRESSION: 1. Developing interstitial opacities at the right lung base from earlier today, suspicious for pneumonia. Asymmetric pulmonary edema is felt less likely given distribution. 2. Emphysema and chronic hyperinflation. Electronically Signed   By: Narda Rutherford M.D.   On: 10/22/2020 23:17    Cardiac Studies     Patient Profile     60 y.o. male with a PMH of chronic combined CHF (30-35%->55-60%), previously underwent cardiac catheterization with normal coronaries in 2011 with LVEF 40%, CHF felt to be secondary to uncontrolled hypertension, paroxysmal atrial fibrillation, HTN, HLD, DM type 2, severe COPD, OSA on CPAP, and tobacco abuse, who is being seen today for the evaluation of CHF.  This is Mr. Stillings third admission in the last 1 and half months. He was admitted on 09/12/20-09/16/20 for acute on chronic respiratory failure in the setting of acute on chronic combined CHF and COPD exacerbation requiring brief intubation and IV diuresis. Again admitted 09/23/20-09/24/20 for COPD exacerbation at which time he received IV>po steroids and azithromycin with improvement in symptoms. Re-admitted 10/03/20-10/13/20 following an out of hospital PEA arrest with successful ROSC after 5 minutes of CPR and 1 round of epi. Felt to be 2/2 COPD exacerbation. Echo that admission showed EF 55-60%, no RWMA, mild LVH, indeterminate LV diastolic function, normal RV systolic function/size, small pericardial effusion, and mild-moderate AS.  Palliative care has been considered in the past but he has not been referred to them yet.  He presented with worsening shortness of breath, EMS found him hypoxic at 80% on room air, he was started on  BiPAP and received 4 SL nitro en route to the ED with improvement in symptoms. On arrival to the ED he was satting in the 90s on BiPAP, hypertensive, tachycardic, and afebrile. EKG with atrial fibrillation with RVR, rate 123 bpm, chronic subtle STD in lateral leads. Initial CXR showed improved aeration at the lung  bases compared to 10/10/20, however repeat CXR ~12 hours later showed developing interstitial opacities at the right lung base suspicious for PNA.    Labs notable for K 4.4, glucose 528, Cr 2.03>1.84 (baseline ~1.5), Hgb 9.7, PLT 339, HsTrop 30>43, procalcitonin <0.10, BNP 1700. COVID-19 negative. Patient was given steroids, nebulizers, and antibiotics in the ED and was admitted to medicine. He was weaned to O2 via [place]. PCCM consulted and felt acute on chronic respiratory failure was most consistent with acute on chronic CHF. He was given IV lasix 40mg .  The patient states he was compliant with his meds at home he was not on   Assessment & Plan    Acute on chronic respiratory failure, secondary to COPD and possible pneumonia Acute on chronic diastolic CHF Hypertensive heart disease with CHF Elevated troponin -demand ischemia Paroxysmal atrial fibrillation -on chronic anticoagulation with Eliquis  He appears euvolemic and clinically is improved. I would switch to Lasix 40 mg PO twice daily, add spironolactone 25 mg po daily. Replace K today with 40 mEq daily. Continue all other meds - carvedilol, Imdur and uptitrate as needed at the next clinic visit. The patient can be discharged, we will arrange for a follow up with labs - BMP and BNP in 7-10 days.   For questions or updates, please contact [facility] Please consult [email or web address] for contact info under     Signed, 9-10, MD  10/24/2020, 9:32 AM

## 2020-10-24 NOTE — Discharge Summary (Signed)
Physician Discharge Summary  Rambo Sarafian [facility]:[id number] DOB: 1960/11/02 DOA: 10/22/2020  PCP: [name], MD  Admit date: 10/22/2020 Discharge date: 10/24/2020  Admitted From: Home Disposition: Home  Recommendations for Outpatient Follow-up:  1. Follow up with PCP in 1-2 weeks 2. Please obtain BMP/CBC in one week 3. Cardiology will schedule follow-up with you. 4. Keep a follow-up with your lung doctors.  Home Health: Not applicable Equipment/Devices: Not applicable  Discharge Condition: Stable CODE STATUS: Full code Diet recommendation: Low-carb, low-salt diet  Discharge summary: 60 year old gentleman with severe COPD, on 2 to 3 L of oxygen at home, CKD stage II, recent PEA cardiac arrest secondary to COPD exacerbation and hypoxia, obstructive sleep apnea on CPAP, type 2 diabetes on Metformin, hypertension, hyperlipidemia, paroxysmal A. fib on Eliquis presented with sudden onset of shortness of breath.  He does have pretty advanced COPD and has been following up at pulmonary clinic.  He has recurrent hospitalizations.  He was admitted to the hospital with COPD exacerbation and treated with aggressive IV steroids, antibiotics with cefepime, stayed on BiPAP overnight and weaned to the nasal cannula oxygen.  Patient also exhibited evidence of diastolic heart failure exacerbation.  He was seen by cardiology.  He did excellent response to IV diuresis with Lasix.  Known ejection fraction 60%.  Likely acute on chronic diastolic heart failure: Patient is stabilized.  Currently back to baseline oxygen requirement.  Treated with high-dose IV Lasix and BiPAP with excellent response.  Patient was initially presented with severe symptoms of shortness of breath requiring BiPAP support, however with aggressive treatment he quickly stabilized and able to go home with outpatient follow-up.  Plan: Discharging home with Lasix 40 mg twice daily, potassium 40 mEq daily, Aldactone 25 mg,  carvedilol.  Added hydralazine 25 mg 3 times a day.  Patient is already on nitrates.  Cardiology to schedule outpatient follow-up. For his COPD, he is already on home oxygen.  He has been optimized on chronic prednisone therapy, bronchodilator therapy and steroid inhalers.  He has a scheduled follow-up with pulmonary.   Discharge Diagnoses:  Principal Problem:   Acute on chronic respiratory failure with hypoxia (HCC) Active Problems:   Essential hypertension   Controlled type 2 diabetes mellitus without complication, without long-term current use of insulin (HCC)   Depression   PAF (paroxysmal atrial fibrillation) (HCC)   Chronic combined systolic and diastolic heart failure (HCC)   Gastroesophageal reflux disease without esophagitis   Anticoagulant long-term use   Chronic constipation   Acute on chronic combined systolic (congestive) and diastolic (congestive) heart failure (HCC)   Hyperlipidemia   Obstructive sleep apnea   COPD with acute exacerbation ([place])   Acute kidney injury superimposed on CKD [facility])    Discharge Instructions  Discharge Instructions    (HEART FAILURE PATIENTS) Call MD:  Anytime you have any of the following symptoms: 1) 3 pound weight gain in 24 hours or 5 pounds in 1 week 2) shortness of breath, with or without a dry hacking cough 3) swelling in the hands, feet or stomach 4) if you have to sleep on extra pillows at night in order to breathe.   Complete by: As directed    Call MD for:  difficulty breathing, headache or visual disturbances   Complete by: As directed    Diet - low sodium heart healthy   Complete by: As directed    Diet Carb Modified   Complete by: As directed    Increase activity slowly   Complete  by: As directed      Allergies as of 10/24/2020      Reactions   Lisinopril Swelling   Other Other (See Comments)   Lettuce : rash   Tomato Rash      Medication List    TAKE these medications   acetaminophen 325 MG tablet Commonly known  as: TYLENOL Take 650 mg by mouth every 6 (six) hours as needed for mild pain or headache.   albuterol (2.5 MG/3ML) 0.083% nebulizer solution Commonly known as: PROVENTIL INHALE 1 VIAL VIA NEBULIZER 5 TIMES DAILY AS NEEDED FOR WHEEZING OR FOR SHORTNESS OF BREATH What changed:   how much to take  how to take this  when to take this  reasons to take this  additional instructions   ARIPiprazole 5 MG tablet Commonly known as: ABILIFY Take 5 mg by mouth daily.   busPIRone 15 MG tablet Commonly known as: BUSPAR Take 1 tablet (15 mg total) by mouth 2 (two) times daily.   carvedilol 12.5 MG tablet Commonly known as: COREG Take 1 tablet (12.5 mg total) by mouth 2 (two) times daily with a meal.   Coricidin HBP Day/Night Cold 10-20 &15-200-2 MG Misc Generic drug: DM-GG & DM-APAP-CPM Take 1 tablet by mouth every 12 (twelve) hours as needed (cold symptoms).   Eliquis 5 MG Tabs tablet Generic drug: apixaban TAKE ONE TABLET BY MOUTH EVERY MORNING and TAKE ONE TABLET BY MOUTH EVERY EVENING What changed: See the new instructions.   famotidine 20 MG tablet Commonly known as: PEPCID Take 20 mg by mouth daily.   FLUoxetine 20 MG capsule Commonly known as: PROZAC TAKE 3 CAPSULES BY MOUTH  DAILY   furosemide 40 MG tablet Commonly known as: LASIX Take 1 tablet (40 mg total) by mouth 2 (two) times daily.   gabapentin 300 MG capsule Commonly known as: NEURONTIN TAKE ONE CAPSULE BY MOUTH THREE TIMES DAILY   guaiFENesin 600 MG 12 hr tablet Commonly known as: MUCINEX Take 1 tablet (600 mg total) by mouth 2 (two) times daily.   hydrALAZINE 25 MG tablet Commonly known as: APRESOLINE Take 1 tablet (25 mg total) by mouth 3 (three) times daily.   ipratropium-albuterol 0.5-2.5 (3) MG/3ML Soln Commonly known as: DUONEB Take 3 mLs by nebulization every 6 (six) hours as needed. What changed: reasons to take this   isosorbide mononitrate 30 MG 24 hr tablet Commonly known as: IMDUR Take  30 mg by mouth daily.   lubiprostone 8 MCG capsule Commonly known as: [name] Take 1 capsule (8 mcg total) by mouth 2 (two) times daily with a meal.   multivitamin with minerals Tabs tablet Take 1 tablet by mouth daily.   nitroGLYCERIN 0.4 MG SL tablet Commonly known as: NITROSTAT DISSOLVE 1 TABLET UNDER THE TONGUE EVERY 5 MINUTES AS&nbsp;&nbsp;NEEDED FOR CHEST PAIN. MAX&nbsp;&nbsp;OF 3 TABLETS IN 15 MINUTES. CALL 911 IF PAIN PERSISTS. What changed:   how much to take  how to take this  when to take this  reasons to take this  additional instructions   ONE TOUCH ULTRA 2 w/Device Kit Use to test blood sugars 1-2 times daily.   OneTouch Ultra test strip Generic drug: glucose blood USE TO TEST BLOOD SUGAR 1-2 TIMES DAILY   onetouch ultrasoft lancets USE TO TEST BLOOD SUGAR 1-2 TIMES DAILY   pantoprazole 40 MG tablet Commonly known as: PROTONIX TAKE ONE TABLET BY MOUTH EVERY MORNING What changed: when to take this   potassium chloride SA 20 MEQ tablet Commonly known  as: KLOR-CON Take 2 tablets (40 mEq total) by mouth daily.   predniSONE 10 MG tablet Commonly known as: DELTASONE Take 1 tablet (10 mg total) by mouth daily with breakfast.   rosuvastatin 20 MG tablet Commonly known as: Crestor Take 1 tablet (20 mg total) by mouth daily.   spironolactone 25 MG tablet Commonly known as: ALDACTONE Take 1 tablet (25 mg total) by mouth daily.   traZODone 50 MG tablet Commonly known as: DESYREL TAKE 1/2 TO 1 TABLET BY  MOUTH AT BEDTIME AS NEEDED  FOR SLEEP. What changed:   reasons to take this  additional instructions   Trelegy Ellipta 100-62.5-25 MCG/INH Aepb Generic drug: Fluticasone-Umeclidin-Vilant Inhale 1 puff into the lungs daily.       Follow-up Information    Buford Dresser, MD Follow up.   Specialty: Cardiology Why: Hospital follow-up with Cardiology scheduled for 10/29/2020 at 8:00am. If this date/time does not work for you, please call our  office to reschedule. Contact information: [address] [place] [address] [phone]              Allergies  Allergen Reactions  . Lisinopril Swelling  . Other Other (See Comments)    Lettuce : rash  . Tomato Rash    Consultations:  Cardiology  PCCM   Procedures/Studies: DG Chest 1 View  Result Date: 10/09/2020 CLINICAL DATA:  Extubation. EXAM: CHEST  1 VIEW COMPARISON:  10/06/2020. FINDINGS: Mediastinum and hilar structures normal. Stable cardiomegaly. Low lung volumes with bibasilar atelectasis/infiltrates, improved from prior exam. Small left pleural effusion. Right costophrenic angle incompletely imaged. No pneumothorax. IMPRESSION: 1. Low lung volumes with bibasilar atelectasis/infiltrates, improved from prior exam. Small left pleural effusion. 2.  Stable cardiomegaly. Electronically Signed   By: Marcello Moores  Register   On: 10/09/2020 06:37   DG Chest 2 View  Result Date: 10/22/2020 CLINICAL DATA:  COPD, follow-up EXAM: CHEST - 2 VIEW COMPARISON:  10/10/2020 FINDINGS: Background changes of COPD. Improved aeration at the lung bases. No pleural effusion. No pneumothorax. Cardiomediastinal contours are within normal limits. No acute osseous abnormality. IMPRESSION: Improved aeration at the lung bases since 10/10/2020. Electronically Signed   By: Macy Mis M.D.   On: 10/22/2020 11:21   DG Abdomen 1 View  Result Date: 10/03/2020 CLINICAL DATA:  60 year old male status post intubation. EXAM: ABDOMEN - 1 VIEW; PORTABLE CHEST - 1 VIEW COMPARISON:  Chest CT dated 09/23/2020. FINDINGS: Endotracheal tube with tip approximately 6 cm above the carina. Enteric tube extends below the diaphragm with tip and side-port in the proximal stomach. Faint bilateral perihilar streaky densities, likely atelectasis. No focal consolidation, pleural effusion or pneumothorax. The cardiac silhouette is within limits. No acute osseous pathology. IMPRESSION: 1. Endotracheal tube above  the carina. 2. Enteric tube with tip and side-port in the proximal stomach. Electronically Signed   By: Anner Crete M.D.   On: 10/03/2020 20:57   CT Head Wo Contrast  Result Date: 10/03/2020 CLINICAL DATA:  Altered mental status status post cardiac arrest EXAM: CT HEAD WITHOUT CONTRAST TECHNIQUE: Contiguous axial images were obtained from the base of the skull through the vertex without intravenous contrast. COMPARISON:  Head CT September 13, 2020. FINDINGS: Brain: No evidence of acute infarction, hemorrhage, hydrocephalus, extra-axial collection or mass lesion/mass effect. Vascular: No hyperdense vessel or unexpected calcification. Skull: Normal. Negative for fracture or focal lesion. Sinuses/Orbits: No acute finding. Other: None. IMPRESSION: No acute intracranial abnormality. Electronically Signed   By: Dahlia Bailiff MD   On:  10/03/2020 21:34   CT Angio Chest PE W and/or Wo Contrast  Result Date: 10/03/2020 CLINICAL DATA:  Chest pain, shortness of breath, post arrest EXAM: CT ANGIOGRAPHY CHEST WITH CONTRAST TECHNIQUE: Multidetector CT imaging of the chest was performed using the standard protocol during bolus administration of intravenous contrast. Multiplanar CT image reconstructions and MIPs were obtained to evaluate the vascular anatomy. CONTRAST:  42m OMNIPAQUE IOHEXOL 350 MG/ML SOLN COMPARISON:  09/23/2020 FINDINGS: Cardiovascular: No filling defects in the pulmonary arteries to suggest pulmonary emboli. Heart is normal size. Aorta is normal caliber. Mediastinum/Nodes: No mediastinal, hilar, or axillary adenopathy. Endotracheal tube tip in the distal trachea. Thyroid and esophagus unremarkable. Lungs/Pleura: Moderate emphysema. No confluent opacities or effusions. Upper Abdomen: Imaging into the upper abdomen demonstrates no acute findings. NG tube in the stomach. Reflux of contrast into the IVC and hepatic veins suggesting right heart dysfunction. Musculoskeletal: Chest wall soft tissues are  unremarkable. No acute bony abnormality. Review of the MIP images confirms the above findings. IMPRESSION: No evidence of pulmonary embolus. Reflux of contrast into the IVC and hepatic veins compatible with right heart dysfunction. Endotracheal tube and NG tube in expected position. Aortic Atherosclerosis (ICD10-I70.0) and Emphysema (ICD10-J43.9). Electronically Signed   By: KRolm BaptiseM.D.   On: 10/03/2020 21:30   DG Chest Port 1 View  Result Date: 10/22/2020 CLINICAL DATA:  Shortness of breath. EXAM: PORTABLE CHEST 1 VIEW COMPARISON:  Radiograph earlier today.  Chest CT 10/03/2020 FINDINGS: Emphysema with chronic hyperinflation. Developing interstitial opacities at the right lung base from earlier today. Stable heart size and mediastinal contours, aortic atherosclerosis. No pleural effusion or pneumothorax. No acute osseous abnormalities are seen. IMPRESSION: 1. Developing interstitial opacities at the right lung base from earlier today, suspicious for pneumonia. Asymmetric pulmonary edema is felt less likely given distribution. 2. Emphysema and chronic hyperinflation. Electronically Signed   By: MKeith RakeM.D.   On: 10/22/2020 23:17   DG CHEST PORT 1 VIEW  Result Date: 10/10/2020 CLINICAL DATA:  Shortness of breath.  Pleural effusion. EXAM: PORTABLE CHEST 1 VIEW COMPARISON:  10/09/2020. FINDINGS: Mediastinum and hilar structures normal. Cardiomegaly. Mild pulmonary venous congestion. Low lung volumes persistent bibasilar atelectasis/infiltrates. No pleural effusion or pneumothorax. Left costophrenic incompletely imaged. Thoracic spine scoliosis. IMPRESSION: 1. Cardiomegaly with mild pulmonary venous congestion. 2. Low lung volumes with persistent bibasilar atelectasis/infiltrates. Electronically Signed   By: TMarcello Moores Register   On: 10/10/2020 07:59   DG CHEST PORT 1 VIEW  Result Date: 10/06/2020 CLINICAL DATA:  Respiratory arrest. EXAM: PORTABLE CHEST 1 VIEW COMPARISON:  10/05/2020 FINDINGS:  Lungs are adequately inflated with persistent hazy patchy density over the mid to lower lungs likely due to infection and less likely asymmetric edema or atelectasis. Cardiomediastinal silhouette and remainder of the exam is unchanged. IMPRESSION: Persistent hazy patchy density over the mid to lower lungs likely infection and less likely asymmetric edema or atelectasis. Electronically Signed   By: DMarin OlpM.D.   On: 10/06/2020 09:21   DG Chest Port 1 View  Result Date: 10/05/2020 CLINICAL DATA:  Hypoxia EXAM: PORTABLE CHEST 1 VIEW COMPARISON:  October 03, 2020 chest radiograph and chest CT FINDINGS: Endotracheal tube tip is 5.2 cm above the carina. Nasogastric tube tip and side port are below the diaphragm. No pneumothorax. There is airspace opacity in the right base, new from recent studies. There is mild left base atelectasis. Heart is upper normal in size with pulmonary vascularity normal. No adenopathy. There is aortic atherosclerosis. No bone lesions.  IMPRESSION: Tube and catheter positions as described without pneumothorax. Airspace opacity right lower lobe, consistent with pneumonia or aspiration. Both of these entities may be present concurrently. There is mild left base atelectasis. Heart upper normal in size. Aortic Atherosclerosis (ICD10-I70.0). Electronically Signed   By: Lowella Grip III M.D.   On: 10/05/2020 08:22   DG Chest Portable 1 View  Result Date: 10/03/2020 CLINICAL DATA:  60 year old male status post intubation. EXAM: ABDOMEN - 1 VIEW; PORTABLE CHEST - 1 VIEW COMPARISON:  Chest CT dated 09/23/2020. FINDINGS: Endotracheal tube with tip approximately 6 cm above the carina. Enteric tube extends below the diaphragm with tip and side-port in the proximal stomach. Faint bilateral perihilar streaky densities, likely atelectasis. No focal consolidation, pleural effusion or pneumothorax. The cardiac silhouette is within limits. No acute osseous pathology. IMPRESSION: 1. Endotracheal  tube above the carina. 2. Enteric tube with tip and side-port in the proximal stomach. Electronically Signed   By: Anner Crete M.D.   On: 10/03/2020 20:57   ECHOCARDIOGRAM COMPLETE  Result Date: 10/04/2020    ECHOCARDIOGRAM REPORT   Patient Name:   SAKAI WOLFORD Date of Exam: 10/04/2020 Medical Rec #:  [id number]    Height:       72.0 in Accession #:    [id number]   Weight:       203.7 lb Date of Birth:  03-08-1961   BSA:          2.147 m Patient Age:    40 years     BP:           109/47 mmHg Patient Gender: M            HR:           64 bpm. Exam Location:  Inpatient Procedure: 2D Echo, Color Doppler, Cardiac Doppler and Strain Analysis Indications:    Cardiac arrest ([place]) [427.5.ICD-9-CM]  History:        Patient has prior history of Echocardiogram examinations, most                 recent 09/13/2020. COPD; Risk Factors:Diabetes, Hypertension,                 Sleep Apnea, Dyslipidemia and Current Smoker. Hronic kidney                 disease. GERD. History of Afib.  Sonographer:    Darlina Sicilian RDCS Referring Phys: [place]  1. Left ventricular ejection fraction, by estimation, is 55 to 60%. The left ventricle has normal function. The left ventricle has no regional wall motion abnormalities. There is mild left ventricular hypertrophy. Left ventricular diastolic parameters are indeterminate.  2. Right ventricular systolic function is normal. The right ventricular size is normal. Tricuspid regurgitation signal is inadequate for assessing PA pressure.  3. A small pericardial effusion is present.  4. The mitral valve is normal in structure. No evidence of mitral valve regurgitation.  5. The aortic valve was not well visualized. Aortic valve regurgitation is mild. Mild to moderate aortic valve stenosis. Vmax 2.8 m/s, MG 66mHg, AVA 1.6 cm^2, DI 0.4 FINDINGS  Left Ventricle: Left ventricular ejection fraction, by estimation, is 55 to 60%. The left ventricle has normal function. The left  ventricle has no regional wall motion abnormalities. The left ventricular internal cavity size was normal in size. There is  mild left ventricular hypertrophy. Left ventricular diastolic parameters are indeterminate. Right Ventricle: The right ventricular size is normal. No  increase in right ventricular wall thickness. Right ventricular systolic function is normal. Tricuspid regurgitation signal is inadequate for assessing PA pressure. Left Atrium: Left atrial size was normal in size. Right Atrium: Right atrial size was normal in size. Pericardium: A small pericardial effusion is present. Mitral Valve: The mitral valve is normal in structure. No evidence of mitral valve regurgitation. Tricuspid Valve: The tricuspid valve is normal in structure. Tricuspid valve regurgitation is trivial. Aortic Valve: The aortic valve was not well visualized. Aortic valve regurgitation is mild. Aortic regurgitation PHT measures 521 msec. Mild to moderate aortic stenosis is present. Aortic valve mean gradient measures 15.4 mmHg. Aortic valve peak gradient  measures 27.7 mmHg. Aortic valve area, by VTI measures 1.83 cm. Pulmonic Valve: The pulmonic valve was not well visualized. Pulmonic valve regurgitation is not visualized. Aorta: The aortic root is normal in size and structure. IAS/Shunts: The interatrial septum was not well visualized.  LEFT VENTRICLE PLAX 2D LVIDd:         5.50 cm  Diastology LVIDs:         4.50 cm  LV e' medial:    5.98 cm/s LV PW:         1.00 cm  LV E/e' medial:  11.4 LV IVS:        1.10 cm  LV e' lateral:   4.79 cm/s LVOT diam:     2.30 cm  LV E/e' lateral: 14.2 LV SV:         85 LV SV Index:   40 LVOT Area:     4.15 cm  RIGHT VENTRICLE RV S prime:     15.40 cm/s TAPSE (M-mode): 2.4 cm LEFT ATRIUM             Index       RIGHT ATRIUM          Index LA diam:        3.40 cm 1.58 cm/m  RA Area:     9.68 cm LA Vol (A2C):   68.2 ml 31.76 ml/m RA Volume:   16.90 ml 7.87 ml/m LA Vol (A4C):   72.7 ml 33.86 ml/m  LA Biplane Vol: 71.4 ml 33.25 ml/m  AORTIC VALVE AV Area (Vmax):    1.97 cm AV Area (Vmean):   1.76 cm AV Area (VTI):     1.83 cm AV Vmax:           263.20 cm/s AV Vmean:          186.000 cm/s AV VTI:            0.465 m AV Peak Grad:      27.7 mmHg AV Mean Grad:      15.4 mmHg LVOT Vmax:         125.00 cm/s LVOT Vmean:        79.000 cm/s LVOT VTI:          0.205 m LVOT/AV VTI ratio: 0.44 AI PHT:            521 msec  AORTA Ao Root diam: 3.40 cm MITRAL VALVE MV Area (PHT): 3.77 cm    SHUNTS MV Decel Time: 201 msec    Systemic VTI:  0.20 m MV E velocity: 68.10 cm/s  Systemic Diam: 2.30 cm MV A velocity: 69.80 cm/s MV E/A ratio:  0.98 Oswaldo Milian MD Electronically signed by Oswaldo Milian MD Signature Date/Time: 10/04/2020/5:46:31 PM    Final    [place] EKG SITE RITE  Result Date: 10/04/2020 If  Site Rite image not attached, placement could not be confirmed due to current cardiac rhythm.   (Echo, Carotid, EGD, Colonoscopy, ERCP)    Subjective: Patient seen and examined.  No overnight events.  He is eager to go home and already dressed up.  Denies any chest pain or shortness of breath.  He walked around and was comfortable on 2 L of oxygen.   Discharge Exam: Vitals:   10/24/20 0542 10/24/20 1214  BP: (!) 161/72 134/88  Pulse: (!) 109 70  Resp: 18 20  Temp: 98.3 F (36.8 C) 98.1 F (36.7 C)  SpO2: 100% 100%   Vitals:   10/24/20 0000 10/24/20 0119 10/24/20 0542 10/24/20 1214  BP: (!) 148/84 (!) 170/79 (!) 161/72 134/88  Pulse: 73 76 (!) 109 70  Resp: 11 20 18 20   Temp:  97.7 F (36.5 C) 98.3 F (36.8 C) 98.1 F (36.7 C)  TempSrc:  Oral  Oral  SpO2: 100% 99% 100% 100%  Weight:      Height:        General: Pt is alert, awake, not in acute distress Chronically sick looking but not in any distress.  Looks fairly comfortable on 2 L. Cardiovascular: RRR, S1/S2 +, no rubs, no gallops Respiratory: Occasional expiratory wheezes.  No other added sounds. Abdominal: Soft, NT, ND,  bowel sounds + Extremities: no edema, no cyanosis    The results of significant diagnostics from this hospitalization (including imaging, microbiology, ancillary and laboratory) are listed below for reference.     Microbiology: Recent Results (from the past 240 hour(s))  SARS Coronavirus 2 by RT PCR (hospital order, performed in [facility] hospital lab) Nasopharyngeal Nasopharyngeal Swab     Status: None   Collection Time: 10/22/20 11:08 PM   Specimen: Nasopharyngeal Swab  Result Value Ref Range Status   SARS Coronavirus 2 NEGATIVE NEGATIVE Final    Comment: (NOTE) SARS-CoV-2 target nucleic acids are NOT DETECTED.  The SARS-CoV-2 RNA is generally detectable in upper and lower respiratory specimens during the acute phase of infection. The lowest concentration of SARS-CoV-2 viral copies this assay can detect is 250 copies / mL. A negative result does not preclude SARS-CoV-2 infection and should not be used as the sole basis for treatment or other patient management decisions.  A negative result may occur with improper specimen collection / handling, submission of specimen other than nasopharyngeal swab, presence of viral mutation(s) within the areas targeted by this assay, and inadequate number of viral copies (<250 copies / mL). A negative result must be combined with clinical observations, patient history, and epidemiological information.  Fact Sheet for Patients:   StrictlyIdeas.no  Fact Sheet for Healthcare Providers: BankingDealers.co.za  This test is not yet approved or  cleared by the [place] FDA and has been authorized for detection and/or diagnosis of SARS-CoV-2 by FDA under an Emergency Use Authorization (EUA).  This EUA will remain in effect (meaning this test can be used) for the duration of the COVID-19 declaration under Section 564(b)(1) of the Act, 21 U.S.C. section 360bbb-3(b)(1), unless the authorization is  terminated or revoked sooner.  Performed at [facility], [place] [address], [place],  [address]      Labs: BNP (last 3 results) Recent Labs    09/23/20 0634 10/03/20 2037 10/23/20 0417  BNP 1,083.5* 960.1* 0,867.6*   Basic Metabolic Panel: Recent Labs  Lab 10/22/20 2301 10/22/20 2314 10/23/20 0105 10/24/20 0228  NA 135 134* 136 140  K 4.4 4.3 4.4  3.2*  CL 90*  --  90* 93*  CO2 29  --  36* 35*  GLUCOSE 528*  --  404* 159*  BUN 11  --  9 16  CREATININE 2.03*  --  1.84* 1.25*  CALCIUM 8.9  --  9.0 8.9  MG  --   --   --  1.8  PHOS  --   --   --  4.1   Liver Function Tests: Recent Labs  Lab 10/22/20 2301 10/23/20 0105  AST 45* 32  ALT 34 34  ALKPHOS 127* 138*  BILITOT 0.6 0.8  PROT 5.9* 6.2*  ALBUMIN 3.4* 3.4*   No results for input(s): LIPASE, AMYLASE in the last 168 hours. No results for input(s): AMMONIA in the last 168 hours. CBC: Recent Labs  Lab 10/22/20 2301 10/22/20 2314 10/23/20 0417 10/24/20 0228  WBC 8.9  --  11.6* 11.8*  NEUTROABS  --   --   --  9.8*  HGB 9.7* 11.6* 9.2* 9.4*  HCT 33.4* 34.0* 31.6* 30.0*  MCV 93.6  --  94.9 91.7  PLT 339  --  262 260   Cardiac Enzymes: No results for input(s): CKTOTAL, CKMB, CKMBINDEX, TROPONINI in the last 168 hours. BNP: Invalid input(s): POCBNP CBG: Recent Labs  Lab 10/23/20 1739 10/24/20 0120 10/24/20 0801 10/24/20 1143 10/24/20 1257  GLUCAP 379* 69* 352* 53* 82   D-Dimer No results for input(s): DDIMER in the last 72 hours. Hgb A1c No results for input(s): HGBA1C in the last 72 hours. Lipid Profile No results for input(s): CHOL, HDL, LDLCALC, TRIG, CHOLHDL, LDLDIRECT in the last 72 hours. Thyroid function studies No results for input(s): TSH, T4TOTAL, T3FREE, THYROIDAB in the last 72 hours.  Invalid input(s): FREET3 Anemia work up No results for input(s): VITAMINB12, FOLATE, FERRITIN, TIBC, IRON, RETICCTPCT in the last 72 hours. Urinalysis    Component Value Date/Time    COLORURINE AMBER (A) 10/03/2020 2036   APPEARANCEUR CLOUDY (A) 10/03/2020 2036   LABSPEC 1.016 10/03/2020 2036   PHURINE 6.0 10/03/2020 2036   GLUCOSEU >=500 (A) 10/03/2020 2036   GLUCOSEU NEGATIVE 05/20/2019 0825   HGBUR NEGATIVE 10/03/2020 2036   BILIRUBINUR NEGATIVE 10/03/2020 2036   KETONESUR NEGATIVE 10/03/2020 2036   PROTEINUR >=300 (A) 10/03/2020 2036   UROBILINOGEN 1.0 05/20/2019 0825   NITRITE NEGATIVE 10/03/2020 2036   LEUKOCYTESUR NEGATIVE 10/03/2020 2036   Sepsis Labs Invalid input(s): PROCALCITONIN,  WBC,  LACTICIDVEN Microbiology Recent Results (from the past 240 hour(s))  SARS Coronavirus 2 by RT PCR (hospital order, performed in [place] hospital lab) Nasopharyngeal Nasopharyngeal Swab     Status: None   Collection Time: 10/22/20 11:08 PM   Specimen: Nasopharyngeal Swab  Result Value Ref Range Status   SARS Coronavirus 2 NEGATIVE NEGATIVE Final    Comment: (NOTE) SARS-CoV-2 target nucleic acids are NOT DETECTED.  The SARS-CoV-2 RNA is generally detectable in upper and lower respiratory specimens during the acute phase of infection. The lowest concentration of SARS-CoV-2 viral copies this assay can detect is 250 copies / mL. A negative result does not preclude SARS-CoV-2 infection and should not be used as the sole basis for treatment or other patient management decisions.  A negative result may occur with improper specimen collection / handling, submission of specimen other than nasopharyngeal swab, presence of viral mutation(s) within the areas targeted by this assay, and inadequate number of viral copies (<250 copies / mL). A negative result must be combined with clinical observations, patient history, and epidemiological  information.  Fact Sheet for Patients:   StrictlyIdeas.no  Fact Sheet for Healthcare Providers: BankingDealers.co.za  This test is not yet approved or  cleared by the [place] FDA  and has been authorized for detection and/or diagnosis of SARS-CoV-2 by FDA under an Emergency Use Authorization (EUA).  This EUA will remain in effect (meaning this test can be used) for the duration of the COVID-19 declaration under Section 564(b)(1) of the Act, 21 U.S.C. section 360bbb-3(b)(1), unless the authorization is terminated or revoked sooner.  Performed at [facility], [place] [address] [place], [place] [address]      Time coordinating discharge:  35 minutes  SIGNED:   Barb Merino, MD  [facility] 10/24/2020, 1:47 PM

## 2020-10-25 ENCOUNTER — Telehealth: Payer: Self-pay

## 2020-10-25 ENCOUNTER — Inpatient Hospital Stay: Payer: Medicare Other | Admitting: Adult Health

## 2020-10-25 ENCOUNTER — Ambulatory Visit: Payer: Medicare Other | Admitting: Adult Health

## 2020-10-25 ENCOUNTER — Ambulatory Visit: Payer: Medicare Other | Admitting: *Deleted

## 2020-10-25 NOTE — Telephone Encounter (Signed)
Transition Care Management Unsuccessful Follow-up Telephone Call  Date of discharge and from where:  10/24/2020-[place]  Attempts:  1st Attempt  Reason for unsuccessful TCM follow-up call:  No answer/busy

## 2020-10-25 NOTE — Progress Notes (Addendum)
Chronic Care Management Pharmacy Assistant   Name: Ora Mcnatt  MRN: [id number] DOB: [birth year]  Reason for Encounter: Medication Review  Patient Questions:  1.  Have you seen any other providers since your last visit? Yes, 10/22/2020 [facility]monary [facility], 10/22/2020 COPD ED  2.  Any changes in your medicines or health? Yes, 10/22/2020 [facility] , started Daliresp 250 mg Daily    PCP : [name], MD  Allergies:   Allergies  Allergen Reactions   Lisinopril Swelling   Other Other (See Comments)    Lettuce : rash   Tomato Rash    Medications: Outpatient Encounter Medications as of 10/25/2020  Medication Sig Note   acetaminophen (TYLENOL) 325 MG tablet Take 650 mg by mouth every 6 (six) hours as needed for mild pain or headache.    albuterol (PROVENTIL) (2.5 MG/3ML) 0.083% nebulizer solution INHALE 1 VIAL VIA NEBULIZER 5 TIMES DAILY AS NEEDED FOR WHEEZING OR FOR SHORTNESS OF BREATH (Patient taking differently: Take 2.5 mg by nebulization every 6 (six) hours as needed for shortness of breath.)    ARIPiprazole (ABILIFY) 5 MG tablet Take 5 mg by mouth daily.    Blood Glucose Monitoring Suppl (ONE TOUCH ULTRA 2) w/Device KIT Use to test blood sugars 1-2 times daily.    busPIRone (BUSPAR) 15 MG tablet Take 1 tablet (15 mg total) by mouth 2 (two) times daily.    carvedilol (COREG) 12.5 MG tablet Take 1 tablet (12.5 mg total) by mouth 2 (two) times daily with a meal.    DM-GG & DM-APAP-CPM (CORICIDIN HBP DAY/NIGHT COLD) 10-20 &15-200-2 MG MISC Take 1 tablet by mouth every 12 (twelve) hours as needed (cold symptoms).    ELIQUIS 5 MG TABS tablet TAKE ONE TABLET BY MOUTH EVERY MORNING and TAKE ONE TABLET BY MOUTH EVERY EVENING (Patient taking differently: Take 5 mg by mouth 2 (two) times daily.)    famotidine (PEPCID) 20 MG tablet Take 20 mg by mouth daily.    FLUoxetine (PROZAC) 20 MG capsule TAKE 3 CAPSULES BY MOUTH  DAILY (Patient taking differently: Take 60  mg by mouth daily.)    Fluticasone-Umeclidin-Vilant (TRELEGY ELLIPTA) 100-62.5-25 MCG/INH AEPB Inhale 1 puff into the lungs daily.    furosemide (LASIX) 40 MG tablet Take 1 tablet (40 mg total) by mouth 2 (two) times daily.    gabapentin (NEURONTIN) 300 MG capsule TAKE ONE CAPSULE BY MOUTH THREE TIMES DAILY (Patient taking differently: Take 300 mg by mouth 3 (three) times daily.) 10/04/2020: Last filled 08/03/20   glucose blood (ONETOUCH ULTRA) test strip USE TO TEST BLOOD SUGAR 1-2 TIMES DAILY    guaiFENesin (MUCINEX) 600 MG 12 hr tablet Take 1 tablet (600 mg total) by mouth 2 (two) times daily.    hydrALAZINE (APRESOLINE) 25 MG tablet Take 1 tablet (25 mg total) by mouth 3 (three) times daily.    ipratropium-albuterol (DUONEB) 0.5-2.5 (3) MG/3ML SOLN Take 3 mLs by nebulization every 6 (six) hours as needed. (Patient taking differently: Take 3 mLs by nebulization every 6 (six) hours as needed (For shortness of breath).)    isosorbide mononitrate (IMDUR) 30 MG 24 hr tablet Take 30 mg by mouth daily.    Lancets (ONETOUCH ULTRASOFT) lancets USE TO TEST BLOOD SUGAR 1-2 TIMES DAILY (Patient not taking: Reported on 10/23/2020)    lubiprostone ([name]) 8 MCG capsule Take 1 capsule (8 mcg total) by mouth 2 (two) times daily with a meal.    Multiple Vitamin (MULTIVITAMIN WITH MINERALS) TABS  tablet Take 1 tablet by mouth daily.    nitroGLYCERIN (NITROSTAT) 0.4 MG SL tablet DISSOLVE 1 TABLET UNDER THE TONGUE EVERY 5 MINUTES AS&nbsp;&nbsp;NEEDED FOR CHEST PAIN. MAX&nbsp;&nbsp;OF 3 TABLETS IN 15 MINUTES. CALL 911 IF PAIN PERSISTS. (Patient taking differently: Place 0.4 mg under the tongue every 5 (five) minutes as needed for chest pain.) 10/04/2020: Last filled 07/25/20   pantoprazole (PROTONIX) 40 MG tablet TAKE ONE TABLET BY MOUTH EVERY MORNING (Patient taking differently: Take 40 mg by mouth daily.)    potassium chloride SA (KLOR-CON) 20 MEQ tablet Take 2 tablets (40 mEq total) by mouth daily.    predniSONE  (DELTASONE) 10 MG tablet Take 1 tablet (10 mg total) by mouth daily with breakfast.    rosuvastatin (CRESTOR) 20 MG tablet Take 1 tablet (20 mg total) by mouth daily.    spironolactone (ALDACTONE) 25 MG tablet Take 1 tablet (25 mg total) by mouth daily.    traZODone (DESYREL) 50 MG tablet TAKE 1/2 TO 1 TABLET BY  MOUTH AT BEDTIME AS NEEDED  FOR SLEEP. (Patient taking differently: Take 25-50 mg by mouth at bedtime as needed for sleep.) 10/04/2020: No fill record in past 6 months   No facility-administered encounter medications on file as of 10/25/2020.    Current Diagnosis: Patient Active Problem List   Diagnosis Date Noted   Acute on chronic respiratory failure with hypoxia ([place]) 10/23/2020   Cardiac arrest ([place]) 10/04/2020   COPD with acute exacerbation ([place]) 09/23/2020   Acute kidney injury superimposed on CKD ([place]) 09/23/2020   Thiamine deficiency 07/02/2020   Chronic respiratory failure with hypoxia ([place]) 06/01/2020   Obstructive sleep apnea 05/02/2020   Type 2 diabetes mellitus with diabetic neuropathy, without long-term current use of insulin ([place]) 04/24/2020   Hyperlipidemia    Acute on chronic combined systolic (congestive) and diastolic (congestive) heart failure ([place]) 04/07/2020   Chronic constipation 07/19/2019   Hospital discharge follow-up 06/17/2019   Shortness of breath 06/12/2019   Snoring 03/29/2019   Insomnia due to other mental disorder 11/15/2018   Anticoagulant long-term use 10/21/2018   Gastroesophageal reflux disease without esophagitis 08/10/2018   Tobacco abuse 08/10/2018   Chest pain 07/27/2018   PAF (paroxysmal atrial fibrillation) ([place]) 07/27/2018   Mild renal insufficiency 07/27/2018   Chronic combined systolic and diastolic heart failure ([place]) 07/27/2018   Slow transit constipation 07/20/2018   Essential hypertension 07/06/2018   Chronic obstructive pulmonary disease ([place]) 07/06/2018   Controlled type 2 diabetes mellitus without complication, without  long-term current use of insulin ([place]) 07/06/2018   Depression 07/06/2018   Macrocytic anemia 07/06/2018   Healthcare maintenance 07/06/2018   Alcohol abuse 07/06/2018    Goals Addressed   None    Reviewed chart for medication changes ahead of medication coordination call.   OVs, Consults, or hospital visits since last care coordination call/Pharmacist visit.   Yes, 10/22/2020 [facility], 10/22/2020 COPD ED  Medication changes indicated  Yes, 10/22/2020 [facility] , started Daliresp 250 mg Daily   BP Readings from Last 3 Encounters:  10/24/20 134/88  10/22/20 128/82  10/13/20 (!) 164/70    Lab Results  Component Value Date   HGBA1C 6.3 (H) 09/13/2020     Patient obtains medications through Vials  90 Days   Last adherence delivery included: None ID  Patient declined medication last month:     Isosorbide Mononitrate 30 MG Tablet Daily  -Breakfast -Adequate supply  Carvedilol 12.5 MG Twice Daily  - Breakfast,Evening Meals -Adequate supply              Amlodipine 5MG Tablet Daily  -Breakfast -Adequate supply              Furosemide 40 Mg Tablet Twice Daily - Breakfast, Evening Meals -Adequate supply              Lubiprostone 8 MCG Capsule Twice Daily - Breakfast, Evening Meals -Adequate supply              Eliquis 5 MG Tablets twice daily -Breakfast, Evening Meals -Adequate supply              Trelegy Ellipta 100-62.5-25 One  Puff Daily -Adequate supply               Pantoprazole 40 MG Tablet Daily -Breakfast -Adequate supply              Gabapentin 300 MG Capsule Three Times Daily - Breakfast, Lunch , Evening meals -Adequate supply               Albuterol 108 MCG/ACT inhaler PRN - adequate supply              Albuterol 0.83% 25CT SOLN PRN  - Patient states he will get this from a company.             Buspirone 15 MG Tablet Two Times Daily - Breakfast, Evening meal- adequate supply             Nitroglycerin 0.4MG SL Tablet PRN -Adequate  supply             Metformin HCI 500 MG Tablet Twice Daily - Breakfast, Evening Meal -adequate supply.(Patient states the ED told him to stop taking for now)             One Touch U-Soft Lancet 100 -Adequate supply             One touch Ult 100CT Test strip - Adequate Supply              Losartan 50MG Tablet Daily - Adequate supply              Fluoxetine 20 MG Capsule Three Capsules Daily- Adequate Supply   Patient is due for next adherence delivery on: 10/31/2020. Called patient and reviewed medications and coordinated delivery.   This delivery to include:  Amlodipine 5MG Tablet Daily -Breakfast Aripiprazole 5 mg daily  Buspirone 15 MG Tablet Two Times Daily - Breakfast, Evening meals Eliquis 5 MG Tablets twice daily -Breakfast, Evening Meals Fluoxetine 20 MG Capsule Three Capsules Daily Furosemide 40 Mg Tablet Twice Daily - Breakfast, Evening Meals Isosorbide Mononitrate 30 MG Tablet Daily -Breakfast Metformin HCI 500 MG Tablet Twice Daily - Breakfast, Evening Meal Pantoprazole 40 MG Tablet Daily -Breakfast Rosuvastatin 20 mg daily   Patient will not need a short fill of medication, prior to adherence delivery. Patient will not need a  acute fill of medication, prior to adherence delivery.  Patient declined the following medications:    Lubiprostone 8 MCG Capsule Twice Daily - Breakfast, Evening Meals -Adequate supply   Gabapentin 300 MG Capsule Three Times Daily - Breakfast, Lunch , Evening meals -Adequate supply     Nitroglycerin 0.4MG SL Tablet PRN -Adequate supply  Albuterol 108 MCG/ACT inhaler PRN - adequate supply              Albuterol 0.83% 25CT SOLN PRN  - Patient  states he will get this from a company.  One Touch U-Soft Lancet 100 -Adequate supply             One touch Ult 100CT Test strip - Adequate Supply  Spironolactone 25 mg-deliver on 10/24/2020  Hydralazine 25 mg - deliver on 10/24/2020  Potassium 20 meq- deliver on 10/24/2020  Daliresp 250 mg - waiting for  patient assistance  Patient is asking if he can restart tramadol that he was on in the past. Patient needs refills for None ID .  Confirmed delivery date of 10/31/2020 , advised patient that pharmacy will contact them the morning of delivery.  Blood pressure readings: Patient states his blood pressure has been ranging around 140's/80's.  Blood sugar readings:  Patient states his blood sugars has been around 200's before meals and without taking metformin. Patient states he was told he can start back taking his metformin today. CAT ASSESSMENT  Rank each of the following items on a scale of 0 to 5 (with 5 being most severe) Write a # 0-5 in each box  I never cough (0) > I cough all the time (5) 3  I have no phlegm (mucus) in my chest (0) > My chest is completely full of phlegm (mucus) (5) 3  My chest does not feel tight at all (0) > My chest feels very tight (5) 3  When I walk up a hill or one flight of stairs I am not breathless (0) > When I walk up a hill or one flight of stairs I am very breathless (5) 0  I am not limited doing any activities at home (0) > I am very limited doing activities at home (5) 4  I am confident leaving my home despite my lung function (0) > I am not at all confident leaving my home because of my lung condition (5)  2  I sleep soundly (0) > I don't sleep soundly because of my lung condition (5) 4  I have lots of energy (0) > I have no energy at all (5) 3   Total CAT Score: 22  Follow-Up:  Pharmacist Review   Bessie [place] Pharmacist Assistant [phone]

## 2020-10-26 ENCOUNTER — Other Ambulatory Visit: Payer: Self-pay | Admitting: *Deleted

## 2020-10-26 NOTE — Telephone Encounter (Signed)
Transition Care Management Unsuccessful Follow-up Telephone Call  Date of discharge and from where:  10/24/2020-[facility]  Attempts:  2nd Attempt  Reason for unsuccessful TCM follow-up call:  No answer/busy

## 2020-10-26 NOTE — Telephone Encounter (Signed)
Transition Care Management Follow-up Telephone Call Date of discharge and from where: 10/24/2020-[facility] How have you been since you were released from the hospital? Doing well-per Kenneth [name] is following all of the instructions he was given. Any questions or concerns? No  Items Reviewed: Did the pt receive and understand the discharge instructions provided? Yes  Medications obtained and verified? Yes  Other? Yes  Any new allergies since your discharge? No  Dietary orders reviewed? Yes Do you have support at home? Yes   Home Care and Equipment/Supplies: Were home health services ordered? no If so, what is the name of the agency? N/a  Has the agency set up a time to come to the patient's home? not applicable Were any new equipment or medical supplies ordered?  No What is the name of the medical supply agency? N/a Were you able to get the supplies/equipment? not applicable Do you have any questions related to the use of the equipment or supplies? N/a Functional Questionnaire: (I = Independent and D = Dependent) ADLs: I  Bathing/Dressing- I  Meal Prep- I  Eating- I  Maintaining continence- I  Transferring/Ambulation- I  Managing Meds- I  Follow up appointments reviewed:  PCP Hospital f/u appt confirmed? Yes  Scheduled to see Alysia Penna on 10/29/20 @ 11:00. Specialist Hospital f/u appt confirmed? Yes  Scheduled to see Cardiology on 10/29/20 @ 8:00. Are transportation arrangements needed? Yes  If their condition worsens, is the pt aware to call PCP or go to the Emergency Dept.? Yes Was the patient provided with contact information for the PCP's office or ED? Yes Was to pt encouraged to call back with questions or concerns? Yes

## 2020-10-26 NOTE — Patient Outreach (Signed)
[facility]) Care Management  10/26/2020  Kenneth [name] 10/15/1960 [id number]   CSW attempted follow up call to pt and was unsuccessful. CSW was able to leave a HIPPA compliant voice message and will await callback or try again in 3-4 business days.  CSW noted recent ED/hospitalization and dc home with possible Hospice-Palliative Care involvement. [name], MSW, LCSW Clinical Social Worker  [facility] [employer] [phone]

## 2020-10-28 NOTE — Progress Notes (Deleted)
Cardiology Office Note:    Date:  10/28/2020   ID:  Kenneth [name], DOB [birth year], MRN [id number]  PCP:  [name], MD  Cardiologist:  Buford Dresser, MD PhD  Referring MD: [name],*   CC: follow up  History of Present Illness:    Kenneth [name] is a 60 y.o. male with a hx of hypertension, diabetes, tobacco use, non-ischemic cardiomyopathy with EF 30-35%, severe COPD on home oxygen who is seen for follow up today. I initially saw him 10/21/18 as a new patient to me at the request of [name],* for the evaluation and management of paroxysmal atrial fibrillation.  Cardiac history: he followed with a cardiologist in [place], then in [place], but he recently moved back to the area. Per care everywhere notes: history of angioedema on ACEi, was on losartan and tolerating in 2016; however in [place] he was not on ARB but hydralazine instead. Cath/echo in 2011 showed normal coronaries, reduced LV function with EF 40%. He was admitted for COPD and HF exacerbation in 11/2017 in [facility], and at that time he was on pradaxa for anticoagulation. He was seen by Roderic Palau in the afib clinic in 07/2018, started on apixaban and carvedilol uptitrated. Echo repeated 08/2018, below. He has had multiple recent hospitalizations for acute hypoxic respiratory failure and COPD exacerbation, with one episode resulting in PEA arrest due to respiratory failure.  Today: Discharged from the hospital 10/24/20. 7 day transition of care appt after his recent hospitalization for acute on chronic respiratory failure. Full hospital course reviewed and medication reconciliation performed.    Past Medical History:  Diagnosis Date  . Anxiety   . Arthritis   . Atrial fibrillation ([place])   . Chest tightness 06/12/2019  . CHF (congestive heart failure) ([place])   . COPD (chronic obstructive pulmonary disease) (HCC)    emphysema  . Depression   . Diabetes mellitus without  complication ([place])   . Heart murmur   . Hyperlipidemia   . Hypertension   . Sleep apnea with use of continuous positive airway pressure (CPAP)     Past Surgical History:  Procedure Laterality Date  . NO PAST SURGERIES      Current Medications: Current Outpatient Medications on File Prior to Visit  Medication Sig  . acetaminophen (TYLENOL) 325 MG tablet Take 650 mg by mouth every 6 (six) hours as needed for mild pain or headache.  . albuterol (PROVENTIL) (2.5 MG/3ML) 0.083% nebulizer solution INHALE 1 VIAL VIA NEBULIZER 5 TIMES DAILY AS NEEDED FOR WHEEZING OR FOR SHORTNESS OF BREATH (Patient taking differently: Take 2.5 mg by nebulization every 6 (six) hours as needed for shortness of breath.)  . ARIPiprazole (ABILIFY) 5 MG tablet Take 5 mg by mouth daily.  . Blood Glucose Monitoring Suppl (ONE TOUCH ULTRA 2) w/Device KIT Use to test blood sugars 1-2 times daily.  . busPIRone (BUSPAR) 15 MG tablet Take 1 tablet (15 mg total) by mouth 2 (two) times daily.  . carvedilol (COREG) 12.5 MG tablet Take 1 tablet (12.5 mg total) by mouth 2 (two) times daily with a meal.  . DM-GG & DM-APAP-CPM (CORICIDIN HBP DAY/NIGHT COLD) 10-20 &15-200-2 MG MISC Take 1 tablet by mouth every 12 (twelve) hours as needed (cold symptoms).  [name] ELIQUIS 5 MG TABS tablet TAKE ONE TABLET BY MOUTH EVERY MORNING and TAKE ONE TABLET BY MOUTH EVERY EVENING (Patient taking differently: Take 5 mg by mouth 2 (two) times daily.)  . famotidine (PEPCID) 20 MG tablet  Take 20 mg by mouth daily.  [name] FLUoxetine (PROZAC) 20 MG capsule TAKE 3 CAPSULES BY MOUTH  DAILY (Patient taking differently: Take 60 mg by mouth daily.)  . Fluticasone-Umeclidin-Vilant (TRELEGY ELLIPTA) 100-62.5-25 MCG/INH AEPB Inhale 1 puff into the lungs daily.  . furosemide (LASIX) 40 MG tablet Take 1 tablet (40 mg total) by mouth 2 (two) times daily.  [name] gabapentin (NEURONTIN) 300 MG capsule TAKE ONE CAPSULE BY MOUTH THREE TIMES DAILY (Patient taking differently: Take  300 mg by mouth 3 (three) times daily.)  . glucose blood (ONETOUCH ULTRA) test strip USE TO TEST BLOOD SUGAR 1-2 TIMES DAILY  . guaiFENesin (MUCINEX) 600 MG 12 hr tablet Take 1 tablet (600 mg total) by mouth 2 (two) times daily.  . hydrALAZINE (APRESOLINE) 25 MG tablet Take 1 tablet (25 mg total) by mouth 3 (three) times daily.  [name] ipratropium-albuterol (DUONEB) 0.5-2.5 (3) MG/3ML SOLN Take 3 mLs by nebulization every 6 (six) hours as needed. (Patient taking differently: Take 3 mLs by nebulization every 6 (six) hours as needed (For shortness of breath).)  . isosorbide mononitrate (IMDUR) 30 MG 24 hr tablet Take 30 mg by mouth daily.  . Lancets (ONETOUCH ULTRASOFT) lancets USE TO TEST BLOOD SUGAR 1-2 TIMES DAILY (Patient not taking: Reported on 10/23/2020)  . lubiprostone ([name]) 8 MCG capsule Take 1 capsule (8 mcg total) by mouth 2 (two) times daily with a meal.  . Multiple Vitamin (MULTIVITAMIN WITH MINERALS) TABS tablet Take 1 tablet by mouth daily.  . nitroGLYCERIN (NITROSTAT) 0.4 MG SL tablet DISSOLVE 1 TABLET UNDER THE TONGUE EVERY 5 MINUTES AS  NEEDED FOR CHEST PAIN. MAX  OF 3 TABLETS IN 15 MINUTES. CALL 911 IF PAIN PERSISTS. (Patient taking differently: Place 0.4 mg under the tongue every 5 (five) minutes as needed for chest pain.)  . pantoprazole (PROTONIX) 40 MG tablet TAKE ONE TABLET BY MOUTH EVERY MORNING (Patient taking differently: Take 40 mg by mouth daily.)  . potassium chloride SA (KLOR-CON) 20 MEQ tablet Take 2 tablets (40 mEq total) by mouth daily.  . predniSONE (DELTASONE) 10 MG tablet Take 1 tablet (10 mg total) by mouth daily with breakfast.  . rosuvastatin (CRESTOR) 20 MG tablet Take 1 tablet (20 mg total) by mouth daily.  [name] spironolactone (ALDACTONE) 25 MG tablet Take 1 tablet (25 mg total) by mouth daily.  . traZODone (DESYREL) 50 MG tablet TAKE 1/2 TO 1 TABLET BY  MOUTH AT BEDTIME AS NEEDED  FOR SLEEP. (Patient taking differently: Take 25-50 mg by mouth at bedtime as needed for  sleep.)   No current facility-administered medications on file prior to visit.     Allergies:   Lisinopril, Other, and Tomato   Social History   Tobacco Use  . Smoking status: Former Smoker    Packs/day: 2.00    Years: 30.00    Pack years: 60.00    Types: Cigarettes    Quit date: 05/01/2019    Years since quitting: 1.4  . Smokeless tobacco: Never Used  . Tobacco comment: smokes 2-3cigs per day  Vaping Use  . Vaping Use: Never used  Substance Use Topics  . Alcohol use: Yes    Comment: Drinks up to six beers around a game  . Drug use: Yes    Types: Marijuana    Comment: occ marijuana    Family History: The patient's family history includes Coronary artery disease in his brother; Diabetes in his father, mother, and sister; Heart disease in his father, mother, and sister;  Kidney disease in his sister; Liver disease in his maternal uncle. Father also had unknown heart disease. Mother had cancer.   ROS:   Please see the history of present illness.  Additional pertinent ROS otherwise unremarkable.  EKGs/Labs/Other Studies Reviewed:    The following studies were reviewed today: Echo 04/07/20 1. LVEF is mildly depressed with inferior hypokinesis. Compared to  previous echo, LVEF is improved. . Left ventricular ejection fraction, by  estimation, is 45 to 50%. The left ventricle has mildly decreased  function. The left ventricle demonstrates  regional wall motion abnormalities (see scoring diagram/findings for  description). The left ventricular internal cavity size was mildly  dilated. Indeterminate diastolic filling due to E-A fusion.  2. Right ventricular systolic function is normal. The right ventricular  size is normal. There is normal pulmonary artery systolic pressure.  3. The mitral valve is abnormal. Trivial mitral valve regurgitation.  4. The aortic valve is abnormal. Aortic valve regurgitation is mild. Mild  to moderate aortic valve sclerosis/calcification is  present, without any  evidence of aortic stenosis.  5. The inferior vena cava is normal in size with greater than 50%  respiratory variability, suggesting right atrial pressure of 3 mmHg.   Echo 06/13/19 1. Left ventricular ejection fraction, by visual estimation, is 30 to  35%. The left ventricle has normal function. Normal left ventricular size.  There is moderately increased left ventricular hypertrophy.  2. Basal and mid inferolateral wall is abnormal.  3. Left ventricular diastolic Doppler parameters are indeterminate  pattern of LV diastolic filling.  4. Global right ventricle has normal systolic function.The right  ventricular size is normal. No increase in right ventricular wall  thickness.  5. Left atrial size was normal.  6. Right atrial size was normal.  7. The mitral valve is normal in structure. Trace mitral valve  regurgitation. No evidence of mitral stenosis.  8. The tricuspid valve is normal in structure. Tricuspid valve  regurgitation is trivial.  9. The aortic valve is normal in structure. Aortic valve regurgitation is  mild to moderate by color flow Doppler. Mild aortic valve sclerosis  without stenosis.  10. The pulmonic valve was normal in structure. Pulmonic valve  regurgitation is not visualized by color flow Doppler.  11. The inferior vena cava is normal in size with greater than 50%  respiratory variability, suggesting right atrial pressure of 3 mmHg.   Echo 09/21/18 Left ventricle: The cavity size was normal. Wall thickness was   normal. Systolic function was moderately to severely reduced. The   estimated ejection fraction was in the range of 30% to 35%.   Diffuse hypokinesis. There is hypokinesis of the inferior and   inferoseptal myocardium. Features are consistent with a   pseudonormal left ventricular filling pattern, with concomitant   abnormal relaxation and increased filling pressure (grade 2   diastolic dysfunction). - Aortic valve:  Trileaflet; moderately thickened, moderately   calcified leaflets. There was mild regurgitation. - Left atrium: The atrium was moderately dilated. - Tricuspid valve: There was trivial regurgitation. - Pulmonary arteries: Systolic pressure was mildly increased. PA   peak pressure: 32 mm Hg (S).  EKG:  EKG is personally reviewed.  The ekg ordered 06/28/20 demonstrates sinus bradycardia, LVH with repol changes, similar to prior to admission ECG  Recent Labs: 10/23/2020: ALT 34; B Natriuretic Peptide 1,701.2 10/24/2020: BUN 16; Creatinine, Ser 1.25; Hemoglobin 9.4; Magnesium 1.8; Platelets 260; Potassium 3.2; Sodium 140  Recent Lipid Panel    Component Value Date/Time  CHOL 226 (H) 07/06/2018 1355   TRIG 275 (H) 10/05/2020 0126   HDL 68.40 07/06/2018 1355   CHOLHDL 3 07/06/2018 1355   VLDL 38.0 07/06/2018 1355   LDLCALC 119 (H) 07/06/2018 1355    Physical Exam:    VS:  There were no vitals taken for this visit.    Wt Readings from Last 3 Encounters:  10/22/20 193 lb 9 oz (87.8 kg)  10/22/20 193 lb 9.6 oz (87.8 kg)  10/13/20 193 lb 4.8 oz (87.7 kg)    GEN: Well nourished, well developed in no acute distress HEENT: Normal, moist mucous membranes NECK: No JVD CARDIAC: regular rhythm, normal S1 and S2, no rubs or gallops. No murmur. VASCULAR: Radial and DP pulses 2+ bilaterally. No carotid bruits RESPIRATORY:  Distant breath sounds with prolonged expiratory phase  ABDOMEN: Soft, non-tender, non-distended MUSCULOSKELETAL:  Ambulates independently SKIN: Warm and dry, no edema NEUROLOGIC:  Alert and oriented x 3. No focal neuro deficits noted. PSYCHIATRIC:  Normal affect   ASSESSMENT:    No diagnosis found. PLAN:    chronic systolic and diastolic heart failure:  -improved from one week ago, no visible JVD, breathing better -weight stable, but notes he has been eating more since quitting smoking. -will check BMET today. As he is doing well on 40 mg BID lasix, would continue this  if renal function stable. If renal function down, would change to 40 mg daily with PRN second dose for weight gain or worsening shortness of breath -EF improved on most recent echo to 45-50% -tolerating carvedilol 12.5 mg BID, watch for bradycardia -had angioedema on ACEi before. Tolerating losartan 50 mg daily, will continue. Have discussed entresto, but given increased risk of angioedema on this, will continue ARB. -continue daily weight, salt avoidance, fluid restriction -counseled on red flag warning signs, including chest pain/pressure, that need immediate medical attention. His symptoms are much improved with the lasix and increased imdur  Paroxysmal atrial fibrillation -CHA2DS2/VAS Stroke Risk Points=3 -continue apixaban, denies bleeding issues  Hypertension: -control improving, at goal today and home numbers improving. -continue imdur 60 mg daily, lasix TBD as above -continue carvedilol, losartan, amlodipine  Sleep apnea: -has CPAP, using.  Severe COPD: -followed by Dr. Lamonte Sakai, multiple admissions for COPD exacerbation -continue tobacco abstinence, minimal smoking now, encouraged abstinence  Type II diabetes, with chronic systolic and diastolic dysfunction: -on metformin -have discussed SGLT2i, but cost is a concern for him. -we have previously discussed recommendation for statin given his risk and diabetes -no longer on aspirin as he is on apixaban  Prevention and risk counseling -recommend heart healthy/Mediterranean diet, with whole grains, fruits, vegetable, fish, lean meats, nuts, and olive oil. Limit salt. -recommend moderate walking, 3-5 times/week for 30-50 minutes each session. Aim for at least 150 minutes.week. Goal should be pace of 3 miles/hours, or walking 1.5 miles in 30 minutes -recommend avoidance of tobacco products. Avoid excess alcohol. -ASCVD risk score: no known ASCVD The 10-year ASCVD risk score Mikey Bussing DC Jr., et al., 2013) is: 37.4%   Values used to  calculate the score:     Age: 6 years     Sex: Male     Is Non-Hispanic African American: Yes     Diabetic: Yes     Tobacco smoker: Yes     Systolic Blood Pressure: 696 mmHg     Is BP treated: Yes     HDL Cholesterol: 68.4 mg/dL     Total Cholesterol: 226 mg/dL   Plan for follow up:  3 months or sooner as needed  Buford Dresser, MD, PhD [place]  [facility]   Medication Adjustments/Labs and Tests Ordered: Current medicines are reviewed at length with the patient today.  Concerns regarding medicines are outlined above.  No orders of the defined types were placed in this encounter.  No orders of the defined types were placed in this encounter.   There are no Patient Instructions on file for this visit.   Signed, Buford Dresser, MD PhD 10/28/2020    [place]

## 2020-10-29 ENCOUNTER — Encounter: Payer: Self-pay | Admitting: Nurse Practitioner

## 2020-10-29 ENCOUNTER — Telehealth (INDEPENDENT_AMBULATORY_CARE_PROVIDER_SITE_OTHER): Payer: Medicare Other | Admitting: Nurse Practitioner

## 2020-10-29 ENCOUNTER — Ambulatory Visit: Payer: Medicare Other | Admitting: Cardiology

## 2020-10-29 ENCOUNTER — Other Ambulatory Visit: Payer: Self-pay | Admitting: Physician Assistant

## 2020-10-29 ENCOUNTER — Other Ambulatory Visit: Payer: Self-pay | Admitting: Family

## 2020-10-29 ENCOUNTER — Encounter: Payer: Self-pay | Admitting: *Deleted

## 2020-10-29 ENCOUNTER — Other Ambulatory Visit: Payer: Self-pay | Admitting: *Deleted

## 2020-10-29 ENCOUNTER — Inpatient Hospital Stay: Payer: Medicare Other | Admitting: Family Medicine

## 2020-10-29 ENCOUNTER — Telehealth: Payer: Self-pay

## 2020-10-29 VITALS — BP 143/68 | Ht 72.0 in | Wt 193.0 lb

## 2020-10-29 DIAGNOSIS — Z794 Long term (current) use of insulin: Secondary | ICD-10-CM | POA: Diagnosis not present

## 2020-10-29 DIAGNOSIS — J449 Chronic obstructive pulmonary disease, unspecified: Secondary | ICD-10-CM | POA: Diagnosis not present

## 2020-10-29 DIAGNOSIS — E1165 Type 2 diabetes mellitus with hyperglycemia: Secondary | ICD-10-CM

## 2020-10-29 DIAGNOSIS — N1831 Chronic kidney disease, stage 3a: Secondary | ICD-10-CM

## 2020-10-29 DIAGNOSIS — I129 Hypertensive chronic kidney disease with stage 1 through stage 4 chronic kidney disease, or unspecified chronic kidney disease: Secondary | ICD-10-CM

## 2020-10-29 DIAGNOSIS — Z7901 Long term (current) use of anticoagulants: Secondary | ICD-10-CM

## 2020-10-29 DIAGNOSIS — I10 Essential (primary) hypertension: Secondary | ICD-10-CM

## 2020-10-29 NOTE — Addendum Note (Signed)
Addended by: Julious Payer A on: 10/29/2020 08:37 AM   Modules accepted: Orders

## 2020-10-29 NOTE — Patient Instructions (Signed)
Schedule appt with pulmonologist: Dr. Delton Coombes as soon as possible. Maintain upcoming appt with cardiology 12/05/2020 Go to lab for repeat CBC and BMP as scheduled.

## 2020-10-29 NOTE — Progress Notes (Signed)
Virtual Visit via Video Note  I connected with@ on 10/29/20 at 11:00 AM EST by a video enabled telemedicine application and verified that I am speaking with the correct person using two identifiers.  Location: Patient:Home Provider: Office Participants: patient and provider  I discussed the limitations of evaluation and management by telemedicine and the availability of in person appointments. I also discussed with the patient that there may be a patient responsible charge related to this service. The patient expressed understanding and agreed to proceed.  ZO:[id number] f/up  History of Present Illness: TCM completed 10/25/2020 Hospital stay 10/22/2020 to 10/24/2020 Uses continous O2 2.5-3L via nasal cannula and CPAP at hs. Today's O2 Saturation: 99% per patient Wt Readings from Last 3 Encounters:  10/29/20 193 lb (87.5 kg)  10/22/20 193 lb 9 oz (87.8 kg)  10/22/20 193 lb 9.6 oz (87.8 kg)   BP Readings from Last 3 Encounters:  10/29/20 (!) 143/68  10/24/20 134/88  10/22/20 128/82   Mr. Mcduffey was hospitalized due to severe acute COPD exacerbation and hypoxia. This was complicated by acute on chronic diastolic HF exacerbation. He was treated with IV steroids, furosemide and antibiotics. He also need use of BiPAP during hospitalization, but was transitioned to baseline nasal canula oxygen prior to discharge. Today he denies any worsening SOB or cough or palpitation or chest pain or dizziness. He is using CPAP machine at bedtime and prn in daytime. He has upcoming appt with cardiology on 12/05/2020. He is still to schedule an appt with Dr. Kavin Leech office (pulmonology).  Reviewed medication list and reconciled with patient. Reviewed discharge summary note, lab results, and radiology reports.  Observations/Objective: Physical Exam Pulmonary:     Effort: Pulmonary effort is normal. No respiratory distress.  Neurological:     Mental Status: He is alert and oriented to person, place, and  time.  Psychiatric:        Mood and Affect: Mood normal.        Behavior: Behavior normal.        Thought Content: Thought content normal.    Assessment and Plan: Duaine was seen today for hospitalization follow-up.  Diagnoses and all orders for this visit:  Anticoagulant long-term use -     CBC w/Diff; Future  Hypertensive kidney disease with stage 3a chronic kidney disease (HCC) -     Basic metabolic panel; Future -     CBC w/Diff; Future  Essential hypertension -     Basic metabolic panel; Future  Chronic obstructive pulmonary disease, unspecified COPD type (HCC)   Follow Up Instructions: Schedule appt with pulmonologist: Dr. Delton Coombes as soon as possible. Maintain upcoming appt with cardiology 12/05/2020 Go to lab for repeat CBC and BMP as scheduled   I discussed the assessment and treatment plan with the patient. The patient was provided an opportunity to ask questions and all were answered. The patient agreed with the plan and demonstrated an understanding of the instructions.   The patient was advised to call back or seek an in-person evaluation if the symptoms worsen or if the condition fails to improve as anticipated.  Alysia Penna, NP

## 2020-10-29 NOTE — Patient Outreach (Signed)
[facility]) Care Management  10/29/2020  Kiven Vangilder 1961/01/02 [id number]   Patient discharged home from hospital on 2/2.  Call placed to [facility] to inquire about enrollment, not completed yet but they will call today to schedule visit and admission to program.  Call placed to member, no answer, HIPAA compliant voice message left.  Will follow up within the next 3-4 business days.  [name], [place], MSN [facility] Care Management  [facility] Manager [phone]

## 2020-10-29 NOTE — Progress Notes (Signed)
Patient would like to reschedule appointment on 10/29/2020 at his PCP Office.  Patient would like to add refills for his aripiprazole and fluoxetine with his delivery on  10/31/2020.  Patient agrees to do 30 day supply instead of 90 days due to his medication changing so much.  Everlean Cherry Clinical Pharmacist Assistant [phone]

## 2020-10-30 ENCOUNTER — Other Ambulatory Visit (INDEPENDENT_AMBULATORY_CARE_PROVIDER_SITE_OTHER): Payer: Medicare Other

## 2020-10-30 ENCOUNTER — Other Ambulatory Visit: Payer: Self-pay

## 2020-10-30 DIAGNOSIS — Z7901 Long term (current) use of anticoagulants: Secondary | ICD-10-CM | POA: Diagnosis not present

## 2020-10-30 DIAGNOSIS — I10 Essential (primary) hypertension: Secondary | ICD-10-CM | POA: Diagnosis not present

## 2020-10-30 DIAGNOSIS — N1831 Chronic kidney disease, stage 3a: Secondary | ICD-10-CM

## 2020-10-30 DIAGNOSIS — I129 Hypertensive chronic kidney disease with stage 1 through stage 4 chronic kidney disease, or unspecified chronic kidney disease: Secondary | ICD-10-CM | POA: Diagnosis not present

## 2020-10-30 LAB — CBC WITH DIFFERENTIAL/PLATELET
Basophils Absolute: 0 10*3/uL (ref 0.0–0.1)
Basophils Relative: 0.4 % (ref 0.0–3.0)
Eosinophils Absolute: 0 10*3/uL (ref 0.0–0.7)
Eosinophils Relative: 0.5 % (ref 0.0–5.0)
HCT: 34.2 % — ABNORMAL LOW (ref 39.0–52.0)
Hemoglobin: 11 g/dL — ABNORMAL LOW (ref 13.0–17.0)
Lymphocytes Relative: 6.4 % — ABNORMAL LOW (ref 12.0–46.0)
Lymphs Abs: 0.6 10*3/uL — ABNORMAL LOW (ref 0.7–4.0)
MCHC: 32.1 g/dL (ref 30.0–36.0)
MCV: 86.9 fl (ref 78.0–100.0)
Monocytes Absolute: 0.5 10*3/uL (ref 0.1–1.0)
Monocytes Relative: 5.1 % (ref 3.0–12.0)
Neutro Abs: 8.4 10*3/uL — ABNORMAL HIGH (ref 1.4–7.7)
Neutrophils Relative %: 87.6 % — ABNORMAL HIGH (ref 43.0–77.0)
Platelets: 240 10*3/uL (ref 150.0–400.0)
RBC: 3.94 Mil/uL — ABNORMAL LOW (ref 4.22–5.81)
RDW: 16 % — ABNORMAL HIGH (ref 11.5–15.5)
WBC: 9.6 10*3/uL (ref 4.0–10.5)

## 2020-10-30 LAB — BASIC METABOLIC PANEL
BUN: 16 mg/dL (ref 6–23)
CO2: 40 mEq/L — ABNORMAL HIGH (ref 19–32)
Calcium: 9.7 mg/dL (ref 8.4–10.5)
Chloride: 87 mEq/L — ABNORMAL LOW (ref 96–112)
Creatinine, Ser: 1.51 mg/dL — ABNORMAL HIGH (ref 0.40–1.50)
GFR: 50.31 mL/min — ABNORMAL LOW (ref >60.00)
Glucose, Bld: 324 mg/dL — ABNORMAL HIGH (ref 70–99)
Potassium: 4.9 mEq/L (ref 3.5–5.1)
Sodium: 132 mEq/L — ABNORMAL LOW (ref 135–145)

## 2020-11-01 ENCOUNTER — Telehealth: Payer: Self-pay | Admitting: Family Medicine

## 2020-11-01 NOTE — Telephone Encounter (Signed)
[facility] [phone] ext [phone]) is calling to get verbal orders for resumption of Physical Therapy. Please give her a call.

## 2020-11-01 NOTE — Telephone Encounter (Signed)
Verbal orders give, per Amy written orders will follow to be signed and sent back. Patient was admitted to hospital no orders given to continue PT after patient returned home.

## 2020-11-02 ENCOUNTER — Telehealth: Payer: Self-pay | Admitting: Family Medicine

## 2020-11-02 NOTE — Telephone Encounter (Signed)
Forms completed for patient assistance on 10/22/20 during his visit with TP.  Nothing further needed.

## 2020-11-02 NOTE — Telephone Encounter (Signed)
Left message for patient to schedule Annual Wellness Visit.  Please schedule with Nurse Health Advisor Martha Stanley, RN at [facility] [place]  °

## 2020-11-02 NOTE — Telephone Encounter (Signed)
Paperwork for patient assistance for [employer] completed during his visit on 10/22/20 with TP.  Nothing further needed.

## 2020-11-03 MED ORDER — PEN NEEDLES 31G X 6 MM MISC: 1.0000 "application " | Freq: Every day | 1 refills | Status: DC

## 2020-11-03 MED ORDER — LANTUS SOLOSTAR 100 UNIT/ML ~~LOC~~ SOPN: 10.0000 [IU] | PEN_INJECTOR | Freq: Every day | SUBCUTANEOUS | 1 refills | Status: DC

## 2020-11-03 NOTE — Addendum Note (Signed)
Addended by: Michaela Corner on: 11/03/2020 04:51 PM   Modules accepted: Orders

## 2020-11-05 ENCOUNTER — Telehealth: Payer: Self-pay | Admitting: *Deleted

## 2020-11-05 ENCOUNTER — Telehealth: Payer: Self-pay

## 2020-11-05 NOTE — Patient Outreach (Signed)
[facility]) Care Management  11/05/2020  Oseph Imburgia 17-Feb-1961 [id number]   CSW attempted another outreach to pt and was unable to reach. CSW left a HIPPA compliant voice message and will attempt again per policy.   [name], MSW, LCSW Clinical Social Worker  [facility] [phone]

## 2020-11-05 NOTE — Progress Notes (Signed)
  CAT ASSESSMENT  Rank each of the following items on a scale of 0 to 5 (with 5 being most severe) Write a # 0-5 in each box  I never cough (0) > I cough all the time (5) 0  I have no phlegm (mucus) in my chest (0) > My chest is completely full of phlegm (mucus) (5) 5  My chest does not feel tight at all (0) > My chest feels very tight (5) 5  When I walk up a hill or one flight of stairs I am not breathless (0) > When I walk up a hill or one flight of stairs I am very breathless (5) 5  I am not limited doing any activities at home (0) > I am very limited doing activities at home (5) 5  I am confident leaving my home despite my lung function (0) > I am not at all confident leaving my home because of my lung condition (5)  2  I sleep soundly (0) > I don't sleep soundly because of my lung condition (5) 0  I have lots of energy (0) > I have no energy at all (5) 5   Total CAT Score: 72  Spoke with patient to inform him if he wanted to receive his needles and Lantus per clinical pharmacist, Patient states he is taking his  metformin and would prefer to have a appiontment first before he starts back on insulin. Patient is schedule for appointment on 11/13/2020 with clinical pharmacist, and he calling his PCP office now to make his annual wellness visit. Notified Clinical Pharmacist.   Everlean Cherry Clinical Pharmacist Assistant [phone]

## 2020-11-06 ENCOUNTER — Other Ambulatory Visit: Payer: Self-pay | Admitting: *Deleted

## 2020-11-06 DIAGNOSIS — M6281 Muscle weakness (generalized): Secondary | ICD-10-CM | POA: Diagnosis not present

## 2020-11-06 DIAGNOSIS — E119 Type 2 diabetes mellitus without complications: Secondary | ICD-10-CM | POA: Diagnosis not present

## 2020-11-06 DIAGNOSIS — R269 Unspecified abnormalities of gait and mobility: Secondary | ICD-10-CM | POA: Diagnosis not present

## 2020-11-06 DIAGNOSIS — I5042 Chronic combined systolic (congestive) and diastolic (congestive) heart failure: Secondary | ICD-10-CM | POA: Diagnosis not present

## 2020-11-06 DIAGNOSIS — I219 Acute myocardial infarction, unspecified: Secondary | ICD-10-CM | POA: Diagnosis not present

## 2020-11-06 DIAGNOSIS — I10 Essential (primary) hypertension: Secondary | ICD-10-CM | POA: Diagnosis not present

## 2020-11-06 DIAGNOSIS — I480 Paroxysmal atrial fibrillation: Secondary | ICD-10-CM | POA: Diagnosis not present

## 2020-11-06 DIAGNOSIS — J441 Chronic obstructive pulmonary disease with (acute) exacerbation: Secondary | ICD-10-CM | POA: Diagnosis not present

## 2020-11-06 MED ORDER — DALIRESP 250 MCG PO TABS: 250.0000 ug | ORAL_TABLET | Freq: Every day | ORAL | 11 refills | Status: DC

## 2020-11-07 ENCOUNTER — Encounter: Payer: Self-pay | Admitting: *Deleted

## 2020-11-07 ENCOUNTER — Ambulatory Visit: Payer: Self-pay

## 2020-11-07 ENCOUNTER — Other Ambulatory Visit: Payer: Self-pay | Admitting: *Deleted

## 2020-11-07 ENCOUNTER — Telehealth: Payer: Self-pay

## 2020-11-07 ENCOUNTER — Telehealth: Payer: Medicare Other

## 2020-11-07 NOTE — Progress Notes (Signed)
Patient asking what is his status on his patient assistance for [employer].Informed patient form was completed on 10/16/2020 with pulmonology office. Waiting on response for approval.   Patient confirms acute delivery on 11/07/2020 for Lantus , pen needles and nitroglycerin.   Everlean Cherry Clinical Pharmacist Assistant [phone]

## 2020-11-07 NOTE — Progress Notes (Signed)
    Pt was [facility] to [facility] division of [facility]--in the home on 11/07/20.  Pt will be receiving nurse visits 1-3 x/month and home-based social worker support as well. ° °Fran Kiser °Referral Specialist °[phone] °

## 2020-11-07 NOTE — Patient Outreach (Signed)
[facility]) Care Management  11/07/2020  Kenneth [name] [birth year] [id number]   Outgoing call placed to member to follow up on contact with [facility] for Palliative care.  State they have home visit scheduled for tomorrow, will be enrolled at that time. Patient is being transitioned to Chronic Care Management with the primary provider office as well as [facility], case closed.  [name], [place], MSN [facility] Care Management  [facility] Manager [phone]

## 2020-11-08 ENCOUNTER — Telehealth: Payer: Medicare Other

## 2020-11-08 DIAGNOSIS — J449 Chronic obstructive pulmonary disease, unspecified: Secondary | ICD-10-CM | POA: Diagnosis not present

## 2020-11-08 DIAGNOSIS — E119 Type 2 diabetes mellitus without complications: Secondary | ICD-10-CM | POA: Diagnosis not present

## 2020-11-08 DIAGNOSIS — J441 Chronic obstructive pulmonary disease with (acute) exacerbation: Secondary | ICD-10-CM | POA: Diagnosis not present

## 2020-11-08 DIAGNOSIS — R269 Unspecified abnormalities of gait and mobility: Secondary | ICD-10-CM | POA: Diagnosis not present

## 2020-11-08 DIAGNOSIS — I480 Paroxysmal atrial fibrillation: Secondary | ICD-10-CM | POA: Diagnosis not present

## 2020-11-08 DIAGNOSIS — I219 Acute myocardial infarction, unspecified: Secondary | ICD-10-CM | POA: Diagnosis not present

## 2020-11-08 DIAGNOSIS — I5042 Chronic combined systolic (congestive) and diastolic (congestive) heart failure: Secondary | ICD-10-CM | POA: Diagnosis not present

## 2020-11-08 DIAGNOSIS — M6281 Muscle weakness (generalized): Secondary | ICD-10-CM | POA: Diagnosis not present

## 2020-11-08 DIAGNOSIS — I10 Essential (primary) hypertension: Secondary | ICD-10-CM | POA: Diagnosis not present

## 2020-11-08 NOTE — Patient Instructions (Addendum)
Visit Information  PATIENT GOALS:  Goals Addressed            This Visit's Progress   . RNCM- Track and Manage My Triggers -COPD       Timeframe:  Long-Range Goal Priority:  High Start Date:       11/07/2020                    Expected End Date:    02/18/2021              Follow up date: 12/04/2020  - eliminate smoking in my home - identify and remove indoor air pollutants - limit outdoor activity during cold weather - listen for public air quality announcements every day  - Monitor for signs of respiratory infection, including changes in sputum color, volume and thickness, as well as fever. Report these changes to your doctor as soon as possible.  - Follow up with your providers as recommeded - Take your medications as prescribed.  - Contact your primary care provider office regarding medication assistance update for your medication Roflumilast.  - Review COPD action plan sent to you in My Chart.  -Continue to use your oxygen as recommended by your provider and monitor oxygen saturation levels with pulse oximetry .  Why is this important?   Triggers are activities or things, like tobacco smoke or cold weather, that make your COPD (chronic obstructive pulmonary disease) flare-up.  Knowing these triggers helps you plan how to stay away from them.  When you cannot remove them, you can learn how to manage them.     Notes:     . RNCM-Track and Manage Fluids and Swelling-Heart Failure       Timeframe:  Long-Range Goal Priority:  High Start Date:     11/07/2020                       Expected End Date:    02/18/2021                 Follow Up Date 12/04/2020  - call office if I gain more than 3 pounds in one day or 5 pounds in one week - track weight in diary - use salt in moderation - watch for swelling in feet, ankles and legs every day - weigh myself daily    Why is this important?    It is important to check your weight daily and watch how much salt and liquids you have.    It will help you to manage your heart failure.    Notes:     . RNCM; Patient will verbalize decrease in anxiety       Timeframe:  Long-Range Goal Priority:  High Start Date:   11/07/2020                          Expected End Date:  02/18/2021                     Follow Up Date 12/04/2020    - call and visit an old friend - laugh; watch a funny movie or comedian - practice relaxation or meditation daily - talk about feelings with a friend, family or spiritual advisor - practice positive thinking and self-talk    Why is this important?    When you are stressed, down or upset, your body reacts too.   For example, your blood pressure  may get higher; you may have a headache or stomachache.   When your emotions get the best of you, your body's ability to fight off cold and flu gets weak.   These steps will help you manage your emotions.     Notes:      COPD Action Plan A COPD action plan is a description of what to do when you have a flare (exacerbation) of chronic obstructive pulmonary disease (COPD). Your action plan is a color-coded plan that lists the symptoms that indicate whether your condition is under control and what actions to take.  If you have symptoms in the Kenneth [name] zone, it means you are doing well that day.  If you have symptoms in the yellow zone, it means you are having a bad day or an exacerbation.  If you have symptoms in the red zone, you need urgent medical care. Follow the plan that you and your health care provider developed. Review your plan with your health care provider at each visit. Red zone Symptoms in this zone mean that you should get medical help right away. They include:  Feeling very short of breath, even when you are resting.  Not being able to do any activities because of poor breathing.  Not being able to sleep because of poor breathing.  Fever or shaking chills.  Feeling confused or very sleepy.  Chest pain.  Coughing up  blood. If you have any of these symptoms, call emergency services (911 in the [place] or go to the nearest emergency room.   Yellow zone Symptoms in this zone mean that your condition may be getting worse. They include:  Feeling more short of breath than usual.  Having less energy for daily activities than usual.  Phlegm or mucus that is thicker than usual.  Needing to use your rescue inhaler or nebulizer more often than usual.  More ankle swelling than usual.  Coughing more than usual.  Feeling like you have a chest cold.  Trouble sleeping due to COPD symptoms.  Decreased appetite.  COPD medicines not helping as much as usual. If you experience any "yellow" symptoms:  Keep taking your daily medicines as directed.  Use your quick-relief inhaler as told by your health care provider.  If you were prescribed steroid medicine to take by mouth (oral medicine), start taking it as told by your health care provider.  If you were prescribed an antibiotic medicine, start taking it as told by your health care provider. Do not stop taking the antibiotic even if you start to feel better.  Use oxygen as told by your health care provider.  Get more rest.  Do your pursed-lip breathing exercises.  Do not smoke. Avoid any irritants in the air. If your signs and symptoms do not improve after taking these steps, call your health care provider right away.   Kenneth [name] zone Symptoms in this zone mean that you are doing well. They include:  Being able to do your usual activities and exercise.  Having the usual amount of coughing, including the same amount of phlegm or mucus.  Being able to sleep well.  Having a good appetite.   Where to find more information: You can find more information about COPD from:  American Lung Association, My COPD Action Plan: www.lung.org  COPD Foundation: www.copdfoundation.org  National Heart, Lung, & Blood Institute: [employer] Follow these  instructions at home:  Continue taking your daily medicines as told by your health care provider.  Make sure you receive  all the immunizations that your health care provider recommends, especially the pneumococcal and influenza vaccines.  Wash your hands often with soap and water. Have family members wash their hands too. Regular hand washing can help prevent infections.  Follow your usual exercise and diet plan.  Avoid irritants in the air, such as smoke.  Do not use any products that contain nicotine or tobacco. These products include cigarettes, chewing tobacco, and vaping devices, such as e-cigarettes. If you need help quitting, ask your health care provider. Summary  A COPD action plan tells you what to do when you have a flare (exacerbation) of chronic obstructive pulmonary disease (COPD).  Follow each action plan for your symptoms. If you have any symptoms in the red zone, call emergency services (911 in the [place] or go to the nearest emergency room. This information is not intended to replace advice given to you by your health care provider. Make sure you discuss any questions you have with your health care provider. Document Revised: 07/17/2020 Document Reviewed: 07/17/2020 Elsevier Patient Education  2021 [employer]   Heart Failure Action Plan A heart failure action plan helps you understand what to do when you have symptoms of heart failure. Your action plan is a color-coded plan that lists the symptoms to watch for and indicates what actions to take.  If you have symptoms in the red zone, you need medical care right away.  If you have symptoms in the yellow zone, you are having problems.  If you have symptoms in the Kenneth [name] zone, you are doing well. Follow the plan that was created by you and your health care provider. Review your plan each time you visit your health care provider. Red zone These signs and symptoms mean you should get medical help right away:  You  have trouble breathing when resting.  You have a dry cough that is getting worse.  You have swelling or pain in your legs or abdomen that is getting worse.  You suddenly gain more than 2-3 lb (0.9-1.4 kg) in 24 hours, or more than 5 lb (2.3 kg) in a week. This amount may be more or less depending on your condition.  You have trouble staying awake or you feel confused.  You have chest pain.  You do not have an appetite.  You pass out.  You have worsening sadness or depression. If you have any of these symptoms, call your local emergency services (911 in the [place] right away. Do not drive yourself to the hospital.   Yellow zone These signs and symptoms mean your condition may be getting worse and you should make some changes:  You have trouble breathing when you are active, or you need to sleep with your head raised on extra pillows to help you breathe.  You have swelling in your legs or abdomen.  You gain 2-3 lb (0.9-1.4 kg) in 24 hours, or 5 lb (2.3 kg) in a week. This amount may be more or less depending on your condition.  You get tired easily.  You have trouble sleeping.  You have a dry cough. If you have any of these symptoms:  Contact your health care provider within the next day.  Your health care provider may adjust your medicines.   Kenneth [name] zone These signs mean you are doing well and can continue what you are doing:  You do not have shortness of breath.  You have very little swelling or no new swelling.  Your weight is stable (no  gain or loss).  You have a normal activity level.  You do not have chest pain or any other new symptoms.   Follow these instructions at home:  Take over-the-counter and prescription medicines only as told by your health care provider.  Weigh yourself daily. Your target weight is __________ lb (__________ kg). ? Call your health care provider if you gain more than __________ lb (__________ kg) in 24 hours, or more than __________  lb (__________ kg) in a week. ? Health care provider name: _____________________________________________________ ? Health care provider phone number: _____________________________________________________  Eat a heart-healthy diet. Work with a diet and nutrition specialist (dietitian) to create an eating plan that is best for you.  Keep all follow-up visits. This is important. Where to find more information  American Heart Association: www.heart.org Summary  A heart failure action plan helps you understand what to do when you have symptoms of heart failure.  Follow the action plan that was created by you and your health care provider.  Get help right away if you have any symptoms in the red zone. This information is not intended to replace advice given to you by your health care provider. Make sure you discuss any questions you have with your health care provider. Document Revised: 04/23/2020 Document Reviewed: 04/23/2020 Elsevier Patient Education  2021 [employer]  Kenneth [name] was given information about Care Management services today including:  1. Care Management services include personalized support from designated clinical staff supervised by his physician, including individualized plan of care and coordination with other care providers 2. 24/7 contact phone numbers for assistance for urgent and routine care needs. 3. The patient may stop CCM services at any time (effective at the end of the month) by phone call to the office staff.  Patient agreed to services and verbal consent obtained.   Patient verbalizes understanding of instructions provided today and agrees to view in MyChart.   The patient has been provided with contact information for the care management team and has been advised to call with any health related questions or concerns.  The care management team will reach out to the patient again over the next 30 days.   George Ina RN,BSN,CCM RN Case Manager [facility]  [phone]

## 2020-11-08 NOTE — Chronic Care Management (AMB) (Signed)
Care Management    RN Visit Note  11/08/2020 Name: Kenneth [name] MRN: [id number] DOB: [birth year]  Subjective: Kenneth [name] is a 60 y.o. year old male who is a primary care patient of [name], MD. The care management team was consulted for assistance with disease management and care coordination needs.    Engaged with patient by telephone for initial visit in response to provider referral for case management and/or care coordination services.   Consent to Services:   [name] was given information about Care Management services today including:  1. Care Management services includes personalized support from designated clinical staff supervised by his physician, including individualized plan of care and coordination with other care providers 2. 24/7 contact phone numbers for assistance for urgent and routine care needs. 3. The patient may stop case management services at any time by phone call to the office staff.  Patient agreed to services and consent obtained.   Assessment: Review of patient past medical history, allergies, medications, health status, including review of consultants reports, laboratory and other test data, was performed as part of comprehensive evaluation and provision of chronic care management services.   SDOH (Social Determinants of Health) assessments and interventions performed:  SDOH Interventions   Flowsheet Row Most Recent Value  SDOH Interventions   Depression Interventions/Treatment  Medication, Counseling, Currently on Treatment     Care Plan  Allergies  Allergen Reactions  . Lisinopril Swelling  . Other Other (See Comments)    Lettuce : rash  . Tomato Rash    Outpatient Encounter Medications as of 11/07/2020  Medication Sig Note  . acetaminophen (TYLENOL) 325 MG tablet Take 650 mg by mouth every 6 (six) hours as needed for mild pain or headache.   . albuterol (PROVENTIL) (2.5 MG/3ML) 0.083% nebulizer solution INHALE 1 VIAL VIA  NEBULIZER 5 TIMES DAILY AS NEEDED FOR WHEEZING OR FOR SHORTNESS OF BREATH (Patient taking differently: Take 2.5 mg by nebulization every 6 (six) hours as needed for shortness of breath.)   . ARIPiprazole (ABILIFY) 5 MG tablet Take 5 mg by mouth daily. 10/29/2020: Prescribed by  Madison Hickman, NP   . busPIRone (BUSPAR) 15 MG tablet Take 1 tablet (15 mg total) by mouth 2 (two) times daily. 10/29/2020: Prescribed by  Madison Hickman, NP    . carvedilol (COREG) 12.5 MG tablet Take 1 tablet (12.5 mg total) by mouth 2 (two) times daily with a meal.   . ELIQUIS 5 MG TABS tablet TAKE ONE TABLET BY MOUTH EVERY MORNING and TAKE ONE TABLET BY MOUTH EVERY EVENING   . famotidine (PEPCID) 20 MG tablet Take 20 mg by mouth daily.   [name] FLUoxetine (PROZAC) 20 MG capsule TAKE 3 CAPSULES BY MOUTH  DAILY 10/29/2020: Prescribed by  Madison Hickman, NP   . Fluticasone-Umeclidin-Vilant (TRELEGY ELLIPTA) 100-62.5-25 MCG/INH AEPB Inhale 1 puff into the lungs daily.   . furosemide (LASIX) 40 MG tablet Take 1 tablet (40 mg total) by mouth 2 (two) times daily.   [name] gabapentin (NEURONTIN) 300 MG capsule TAKE ONE CAPSULE BY MOUTH THREE TIMES DAILY (Patient taking differently: Take 300 mg by mouth 3 (three) times daily.) 10/04/2020: Last filled 08/03/20  . guaiFENesin (MUCINEX) 600 MG 12 hr tablet Take 1 tablet (600 mg total) by mouth 2 (two) times daily.   . hydrALAZINE (APRESOLINE) 25 MG tablet Take 1 tablet (25 mg total) by mouth 3 (three) times daily.   . insulin glargine (LANTUS SOLOSTAR) 100 UNIT/ML Solostar Pen Inject 10 Units  into the skin at bedtime.   [name] ipratropium-albuterol (DUONEB) 0.5-2.5 (3) MG/3ML SOLN Take 3 mLs by nebulization every 6 (six) hours as needed. (Patient taking differently: Take 3 mLs by nebulization every 6 (six) hours as needed (For shortness of breath).)   . isosorbide mononitrate (IMDUR) 30 MG 24 hr tablet Take 30 mg by mouth daily.   [name] lubiprostone ([name]) 8 MCG capsule Take 1 capsule (8 mcg total)  by mouth 2 (two) times daily with a meal.   . Multiple Vitamin (MULTIVITAMIN WITH MINERALS) TABS tablet Take 1 tablet by mouth daily.   . nitroGLYCERIN (NITROSTAT) 0.4 MG SL tablet DISSOLVE 1 TABLET UNDER THE TONGUE EVERY 5 MINUTES AS&nbsp;&nbsp;NEEDED FOR CHEST PAIN. MAX&nbsp;&nbsp;OF 3 TABLETS IN 15 MINUTES. CALL 911 IF PAIN PERSISTS. (Patient taking differently: Place 0.4 mg under the tongue every 5 (five) minutes as needed for chest pain.) 10/04/2020: Last filled 07/25/20  . pantoprazole (PROTONIX) 40 MG tablet TAKE ONE TABLET BY MOUTH EVERY MORNING   . potassium chloride SA (KLOR-CON) 20 MEQ tablet Take 2 tablets (40 mEq total) by mouth daily.   . predniSONE (DELTASONE) 10 MG tablet Take 1 tablet (10 mg total) by mouth daily with breakfast.   . rosuvastatin (CRESTOR) 20 MG tablet TAKE ONE TABLET BY MOUTH ONCE DAILY   . spironolactone (ALDACTONE) 25 MG tablet Take 1 tablet (25 mg total) by mouth daily.   . Blood Glucose Monitoring Suppl (ONE TOUCH ULTRA 2) w/Device KIT Use to test blood sugars 1-2 times daily.   [name] DM-GG & DM-APAP-CPM (CORICIDIN HBP DAY/NIGHT COLD) 10-20 &15-200-2 MG MISC Take 1 tablet by mouth every 12 (twelve) hours as needed (cold symptoms).   [name] glucose blood (ONETOUCH ULTRA) test strip USE TO TEST BLOOD SUGAR 1-2 TIMES DAILY   . Insulin Pen Needle (PEN NEEDLES) 31G X 6 MM MISC 1 application by Does not apply route at bedtime.   . Lancets (ONETOUCH ULTRASOFT) lancets USE TO TEST BLOOD SUGAR 1-2 TIMES DAILY   . Roflumilast (DALIRESP) 250 MCG TABS Take 250 mcg by mouth daily. (Patient not taking: Reported on 11/07/2020)    No facility-administered encounter medications on file as of 11/07/2020.    Patient Active Problem List   Diagnosis Date Noted  . Acute on chronic respiratory failure with hypoxia ([place]) 10/23/2020  . Cardiac arrest ([place]) 10/04/2020  . COPD with acute exacerbation ([place]) 09/23/2020  . Acute kidney injury superimposed on CKD ([place]) 09/23/2020  . Thiamine  deficiency 07/02/2020  . Obstructive sleep apnea 05/02/2020  . Hyperlipidemia   . Acute on chronic combined systolic (congestive) and diastolic (congestive) heart failure ([place]) 04/07/2020  . Chronic constipation 07/19/2019  . Hospital discharge follow-up 06/17/2019  . Shortness of breath 06/12/2019  . Snoring 03/29/2019  . Insomnia due to other mental disorder 11/15/2018  . Anticoagulant long-term use 10/21/2018  . Gastroesophageal reflux disease without esophagitis 08/10/2018  . Tobacco abuse 08/10/2018  . Chest pain 07/27/2018  . Mild renal insufficiency 07/27/2018  . Essential hypertension 07/06/2018  . Chronic obstructive pulmonary disease ([place]) 07/06/2018  . Controlled type 2 diabetes mellitus without complication, without long-term current use of insulin ([place]) 07/06/2018  . Healthcare maintenance 07/06/2018  . Alcohol abuse 07/06/2018  . Paroxysmal atrial fibrillation ([place]) 11/28/2017  . Acute on chronic systolic heart failure ([place]) 11/28/2017  . Slow transit constipation 06/07/2016  . Chronic anemia 06/06/2016  . Hypertensive kidney disease with chronic kidney disease stage III ([place]) 06/06/2016  . Mixed anxiety and depressive disorder 06/05/2016  .  Type 2 diabetes mellitus, without long-term current use of insulin ([place]) 06/05/2016  . Chronic obstructive pulmonary disease with (acute) exacerbation ([facility]) 06/05/2016    Conditions to be addressed/monitored: CHF, COPD and Anxiety  Care Plan : COPD (Adult)  Updates made by Dannielle Karvonen, RN since 11/08/2020 12:00 AM  Problem: Symptom Exacerbation (COPD)   Priority: High  Long-Range Goal: Symptom Exacerbation Prevented or Minimized   Start Date: 11/07/2020  Expected End Date: 02/18/2021  Recent Progress: On track  Priority: High  Current Barriers:  [name] Knowledge deficits related to basic COPD disease process and self care/management Case Manager Clinical Goal(s):   patient will be able to verbalize understanding of COPD  action plan and when to seek appropriate levels of medical care   patient will verbalize basic understanding of COPD disease process and self care activities  Interventions:  . Collaboration with [name], MD regarding development and update of comprehensive plan of care as evidenced by provider attestation and co-signature . Inter-disciplinary care team collaboration (see longitudinal plan of care)  Provided patient with basic written and verbal COPD education on self care/management/and exacerbation prevention   Provided patient with COPD action plan and reinforced importance of daily self assessment  - communicable disease prevention promoted  - medication-adherence assessment completed.   - quality of sleep assessed  - rescue (action) plan reviewed  - screen for functional limitations completed and reviewed  - self-awareness of symptom triggers encouraged Patient Goals/Self-Care Activities:  - eliminate smoking in my home - identify and remove indoor air pollutants - limit outdoor activity during cold weather - listen for public air quality announcements every day - Monitor for signs of respiratory infection, including changes in sputum color, volume and thickness, as well as fever. Report these changes to your doctor as soon as possible.  -Take your medications as prescribed. - Keep follow up appointments with your doctors - Contact your primary care provider office regarding medication assistance update for your medication Roflumilast.  - Review COPD action plan sent to you in My Chart. -Continue to use your oxygen as recommended by your provider and monitor oxygen saturation levels with pulse oximetry . Follow Up Plan: The patient has been provided with contact information for the care management team and has been advised to call with any health related questions or concerns.  The care management team will reach out to the patient again over the next 30 days.      Problem: Coping Skills (General Plan of Care)   Priority: High  Long-Range Goal: Coping Skills Enhanced   Start Date: 11/07/2020  Expected End Date: 02/18/2021  This Visit's Progress: On track  Priority: High  Current Barriers:   Knowledge Deficits related to limited coping strategies for managing complex medical conditions  Clinical Goal(s):  [name] Collaboration with [name], MD regarding development and update of comprehensive plan of care as evidenced by provider attestation and co-signature . Inter-disciplinary care team collaboration (see longitudinal plan of care)  Patient will work with care management team to establish personal coping strategies.     Interventions:   Evaluation of current treatment plan related to anxiety/ depression and patient's adherence to plan as established by provider.  Collaboration with [name], MD regarding development and update of comprehensive plan of care as evidenced by provider attestation       and co-signature  Inter-disciplinary care team collaboration (see longitudinal plan of care)  Discussed plans with patient for ongoing care management  follow up and provided patient with direct contact information for care management team  Provided opportunity for patient to express concerns, fears, feelings and expectations regarding his medical conditions/ outcomes.  Patient Goals/Self Care Activities:  Patient will:  . call provider office for new concerns or questions . work with Chief Strategy Officer to address care coordination needs and will continue to work with counselor to address mental health needs   .  laugh; watch a funny movie or comedian .  practice relaxation or meditation daily .  talk about feelings with a friend, family or spiritual advisor .  practice positive thinking and self-talk Follow Up Plan: The patient has been provided with contact information for the care management team and has been advised to  call with any health related questions or concerns.  The care management team will reach out to the patient again over the next 30 days.     Care Plan : Heart Failure (Adult)  Updates made by Dannielle Karvonen, RN since 11/08/2020 12:00 AM  Problem: Disease Progression (Heart Failure)   Priority: High  Onset Date: 11/07/2020  Long-Range Goal: Patient will verbalize understanding of heart failure self management strategies   Start Date: 11/07/2020  Expected End Date: 02/18/2021  This Visit's Progress: On track  Priority: High  Current Barriers:  [name] Knowledge deficit related to basic heart failure pathophysiology and self care management Case Manager Clinical Goal(s):  . patient will verbalize understanding of Heart Failure Action Plan and when to call doctor . patient will take all Heart Failure medications as prescribed . patient will weigh daily and record (notifying MD of 3 lb weight gain over night or 5 lb in a week) . Patient will monitor / decrease sodium intake . Patient will contact the RN case manager if he has difficulty obtaining his medications.  Interventions:  . Collaboration with [name], MD regarding development and update of comprehensive plan of care as evidenced by provider attestation and co-signature . Inter-disciplinary care team collaboration (see longitudinal plan of care) . Basic overview and discussion of pathophysiology of Heart Failure reviewed  . Provided verbal and written education on low sodium diet . Reviewed Heart Failure Action Plan in depth and provided written copy . Discussed importance of daily weight and advised patient to weigh and record daily . Reviewed role of diuretics in prevention of fluid overload and management of heart failure Patient Goals/Self-Care Activities - call office if I gain more than 3 pounds in one day or 5 pounds in one week - use salt in moderation - watch for swelling in feet, ankles and legs every day - weigh  myself daily and record weights. Follow Up Plan: The patient has been provided with contact information for the care management team and has been advised to call with any health related questions or concerns.  The care management team will reach out to the patient again over the next 30 days.       Plan: The patient has been provided with contact information for the care management team and has been advised to call with any health related questions or concerns.  and The care management team will reach out to the patient again over the next 30 days.  Quinn Plowman RN,BSN,CCM RN Case Manager Claudie Fisherman [phone]

## 2020-11-09 ENCOUNTER — Telehealth: Payer: Self-pay | Admitting: *Deleted

## 2020-11-09 ENCOUNTER — Encounter: Payer: Self-pay | Admitting: *Deleted

## 2020-11-09 NOTE — Progress Notes (Signed)
This encounter was created in error - please disregard.

## 2020-11-09 NOTE — Telephone Encounter (Signed)
  Chronic Care Management   Outreach Note  11/09/2020 Name: Kenneth [name] MRN: [id number] DOB: 09-22-1961  Referred by: Mliss Sax, MD Reason for referral : Care Coordination (Unsuccessful outreach call )   An unsuccessful telephone outreach was attempted today. The patient was referred to the case management team for assistance with care management and care coordination.     Follow Up Plan: The care management team will reach out to the patient again over the next 10 days.   [name] MSW, LCSW Licensed Clinical Social Worker [employer] [phone]

## 2020-11-09 NOTE — Telephone Encounter (Signed)
This encounter was created in error - please disregard.

## 2020-11-12 ENCOUNTER — Telehealth: Payer: Self-pay

## 2020-11-12 ENCOUNTER — Ambulatory Visit (INDEPENDENT_AMBULATORY_CARE_PROVIDER_SITE_OTHER): Payer: Medicare Other

## 2020-11-12 ENCOUNTER — Encounter: Payer: Medicare Other | Admitting: *Deleted

## 2020-11-12 DIAGNOSIS — F418 Other specified anxiety disorders: Secondary | ICD-10-CM | POA: Diagnosis not present

## 2020-11-12 DIAGNOSIS — E1140 Type 2 diabetes mellitus with diabetic neuropathy, unspecified: Secondary | ICD-10-CM

## 2020-11-12 DIAGNOSIS — N1831 Chronic kidney disease, stage 3a: Secondary | ICD-10-CM

## 2020-11-12 NOTE — Chronic Care Management (AMB) (Unsigned)
Care Management Clinical Social Work Note  11/13/2020 Name: Kenneth [name] MRN: [id number] DOB: Oct 09, 1960  Kenneth [name] is a 60 y.o. year old male who is a primary care patient of Kenneth Maw, MD.  The Care Management team was consulted for assistance with chronic disease management and coordination needs. Engaged with patient by telephone for follow up visit in response to provider referral for social work chronic care management and care coordination services Consent to Services:  Kenneth [name] was given information about Care Management services today including:  1. Care Management services includes personalized support from designated clinical staff supervised by his physician, including individualized plan of care and coordination with other care providers 2. 24/7 contact phone numbers for assistance for urgent and routine care needs. 3. The patient may stop case management services at any time by phone call to the office staff. Patient agreed to services and consent obtained.  Assessment: Review of patient past medical history, allergies, medications, and health status, including review of relevant consultants reports was performed today as part of a comprehensive evaluation and provision of chronic care management and care coordination services. SDOH (Social Determinants of Health) assessments and interventions performed:   Advanced Directives Status: Not addressed in this encounter.  Care Plan  Allergies  Allergen Reactions  . Lisinopril Swelling  . Other Other (See Comments)    Lettuce : rash  . Tomato Rash    Outpatient Encounter Medications as of 11/12/2020  Medication Sig Note  . ARIPiprazole (ABILIFY) 5 MG tablet Take 5 mg by mouth daily. 10/29/2020: Prescribed by  Kenneth Hickman, NP   . gabapentin (NEURONTIN) 300 MG capsule TAKE ONE CAPSULE BY MOUTH THREE TIMES DAILY (Patient taking differently: Take 300 mg by mouth 3 (three) times daily.) 11/12/2020: Pt has questions  and concerns about this RX and it possibly causing him to have "trouble holding things"  . acetaminophen (TYLENOL) 325 MG tablet Take 650 mg by mouth every 6 (six) hours as needed for mild pain or headache.   . albuterol (PROVENTIL) (2.5 MG/3ML) 0.083% nebulizer solution INHALE 1 VIAL VIA NEBULIZER 5 TIMES DAILY AS NEEDED FOR WHEEZING OR FOR SHORTNESS OF BREATH (Patient taking differently: Take 2.5 mg by nebulization every 6 (six) hours as needed for shortness of breath.)   . Blood Glucose Monitoring Suppl (ONE TOUCH ULTRA 2) w/Device KIT Use to test blood sugars 1-2 times daily.   . busPIRone (BUSPAR) 15 MG tablet Take 1 tablet (15 mg total) by mouth 2 (two) times daily. 10/29/2020: Prescribed by  Kenneth Hickman, NP    . carvedilol (COREG) 12.5 MG tablet Take 1 tablet (12.5 mg total) by mouth 2 (two) times daily with a meal.   . DM-GG & DM-APAP-CPM (CORICIDIN HBP DAY/NIGHT COLD) 10-20 &15-200-2 MG MISC Take 1 tablet by mouth every 12 (twelve) hours as needed (cold symptoms).   [name] ELIQUIS 5 MG TABS tablet TAKE ONE TABLET BY MOUTH EVERY MORNING and TAKE ONE TABLET BY MOUTH EVERY EVENING   . famotidine (PEPCID) 20 MG tablet Take 20 mg by mouth daily.   [name] FLUoxetine (PROZAC) 20 MG capsule TAKE 3 CAPSULES BY MOUTH  DAILY 10/29/2020: Prescribed by  Kenneth Hickman, NP   . Fluticasone-Umeclidin-Vilant (TRELEGY ELLIPTA) 100-62.5-25 MCG/INH AEPB Inhale 1 puff into the lungs daily.   . furosemide (LASIX) 40 MG tablet Take 1 tablet (40 mg total) by mouth 2 (two) times daily.   [name] glucose blood (ONETOUCH ULTRA) test strip USE TO TEST BLOOD SUGAR 1-2 TIMES  DAILY   . guaiFENesin (MUCINEX) 600 MG 12 hr tablet Take 1 tablet (600 mg total) by mouth 2 (two) times daily.   . hydrALAZINE (APRESOLINE) 25 MG tablet Take 1 tablet (25 mg total) by mouth 3 (three) times daily.   . insulin glargine (LANTUS SOLOSTAR) 100 UNIT/ML Solostar Pen Inject 10 Units into the skin at bedtime.   . Insulin Pen Needle (PEN NEEDLES) 31G X 6  MM MISC 1 application by Does not apply route at bedtime.   [name] ipratropium-albuterol (DUONEB) 0.5-2.5 (3) MG/3ML SOLN Take 3 mLs by nebulization every 6 (six) hours as needed. (Patient taking differently: Take 3 mLs by nebulization every 6 (six) hours as needed (For shortness of breath).)   . isosorbide mononitrate (IMDUR) 30 MG 24 hr tablet Take 30 mg by mouth daily.   . Lancets (ONETOUCH ULTRASOFT) lancets USE TO TEST BLOOD SUGAR 1-2 TIMES DAILY   . lubiprostone ([name]) 8 MCG capsule Take 1 capsule (8 mcg total) by mouth 2 (two) times daily with a meal.   . Multiple Vitamin (MULTIVITAMIN WITH MINERALS) TABS tablet Take 1 tablet by mouth daily.   . nitroGLYCERIN (NITROSTAT) 0.4 MG SL tablet DISSOLVE 1 TABLET UNDER THE TONGUE EVERY 5 MINUTES AS  NEEDED FOR CHEST PAIN. MAX  OF 3 TABLETS IN 15 MINUTES. CALL 911 IF PAIN PERSISTS. (Patient taking differently: Place 0.4 mg under the tongue every 5 (five) minutes as needed for chest pain.) 10/04/2020: Last filled 07/25/20  . pantoprazole (PROTONIX) 40 MG tablet TAKE ONE TABLET BY MOUTH EVERY MORNING   . potassium chloride SA (KLOR-CON) 20 MEQ tablet Take 2 tablets (40 mEq total) by mouth daily.   . predniSONE (DELTASONE) 10 MG tablet Take 1 tablet (10 mg total) by mouth daily with breakfast.   . Roflumilast (DALIRESP) 250 MCG TABS Take 250 mcg by mouth daily. (Patient not taking: Reported on 11/07/2020)   . rosuvastatin (CRESTOR) 20 MG tablet TAKE ONE TABLET BY MOUTH ONCE DAILY   . spironolactone (ALDACTONE) 25 MG tablet Take 1 tablet (25 mg total) by mouth daily.    No facility-administered encounter medications on file as of 11/12/2020.    Patient Active Problem List   Diagnosis Date Noted  . Acute on chronic respiratory failure with hypoxia ([place]) 10/23/2020  . Cardiac arrest ([place]) 10/04/2020  . COPD with acute exacerbation ([place]) 09/23/2020  . Acute kidney injury superimposed on CKD ([place]) 09/23/2020  . Thiamine deficiency 07/02/2020  . Obstructive  sleep apnea 05/02/2020  . Hyperlipidemia   . Acute on chronic combined systolic (congestive) and diastolic (congestive) heart failure ([place]) 04/07/2020  . Chronic constipation 07/19/2019  . Hospital discharge follow-up 06/17/2019  . Shortness of breath 06/12/2019  . Snoring 03/29/2019  . Insomnia due to other mental disorder 11/15/2018  . Anticoagulant long-term use 10/21/2018  . Gastroesophageal reflux disease without esophagitis 08/10/2018  . Tobacco abuse 08/10/2018  . Chest pain 07/27/2018  . Mild renal insufficiency 07/27/2018  . Essential hypertension 07/06/2018  . Chronic obstructive pulmonary disease ([place]) 07/06/2018  . Controlled type 2 diabetes mellitus without complication, without long-term current use of insulin ([place]) 07/06/2018  . Healthcare maintenance 07/06/2018  . Alcohol abuse 07/06/2018  . Paroxysmal atrial fibrillation ([place]) 11/28/2017  . Acute on chronic systolic heart failure ([place]) 11/28/2017  . Slow transit constipation 06/07/2016  . Chronic anemia 06/06/2016  . Hypertensive kidney disease with chronic kidney disease stage III ([place]) 06/06/2016  . Mixed anxiety and depressive disorder 06/05/2016  . Type 2  diabetes mellitus, without long-term current use of insulin ([place]) 06/05/2016  . Chronic obstructive pulmonary disease with (acute) exacerbation ([place]) 06/05/2016    Conditions to be addressed/monitored: DMII, ESRD and Depression; Mental Health Concerns   Long-Range Goal: Develop long term plan for self management of depression   Start Date: 11/12/2020  This Visit's Progress: On track  Priority: High  Note:   Current Barriers:  . Chronic Mental Health needs related to depression and health conditions-pt has been attending bi-weekly counseling sessions (virtually and some in person) and feels this is helping to "give me a sense of direction" and to set goals. . Suicidal Ideation/Homicidal Ideation: No  Clinical Social Work Goal(s):  . patient will work with  SW bi-weekly by telephone or in person to reduce or manage symptoms related to depression . Over the next 21 days, patient will work with SW to address concerns related to depression  Interventions: . Patient interviewed and appropriate assessments performed: PHQ 9 . Discussed plans with patient for ongoing care management follow up and provided patient with direct contact information for care management team . Collaborated with RN Case Manager re: medication concerns; metformin and gabapentin- Per pt, Metformin was discontinued by Palliative team and his CBG's have been in the 500's. Also, pt reports having trouble holding on to things and wonders if this is also RX (Gabaoentin?) related.  . Motivational Interviewing, Solution-Focused Strategies, and Emotional/Supportive Counseling  Patient Self Care Activities:  . Self administers medications as prescribed, Attends all scheduled provider appointments, Calls provider office for new concerns or questions, and Motivation for treatment  Patient Coping Strengths:  . Supportive Relationships . Hopefulness . Able to Communicate Effectively  Patient Self Care Deficits:  . Recent transition to palliative care support and pt's need for understanding/accepting their plan of care  Patient Goals: Continue with bi-weekly counseling visits Attempt in-person visits as able Set goals with counselor   Follow Up Plan: SW will follow up with patient by phone over the next 3 weeks.        Follow Up Plan: SW will follow up with patient by phone over the next 3 weeks  Eduard Clos MSW, [place] Licensed Clinical Social Worker [employer] [phone]

## 2020-11-12 NOTE — Progress Notes (Unsigned)
Erroneous encounter

## 2020-11-12 NOTE — Progress Notes (Signed)
This encounter was created in error - please disregard.

## 2020-11-12 NOTE — Patient Instructions (Signed)
Visit Information  PATIENT GOALS:  Goals Addressed            This Visit's Progress   . Keep bi-weekly appointments with counselor       Timeframe:  Long-Range Goal Priority:  High Start Date:    11/12/20                         Expected End Date:             02/19/2021         Follow Up Date 12/03/2020   Continue with bi-weekly counseling visits Attempt in-person visits as able Set goals with counselor       Why is this important?    Beating depression may take some time.   If you don't feel better right away, don't give up on your treatment plan.         Kenneth [name] was given information about Care Management services today including:  1. Care Management services include personalized support from designated clinical staff supervised by his physician, including individualized plan of care and coordination with other care providers 2. 24/7 contact phone numbers for assistance for urgent and routine care needs. 3. The patient may stop CCM services at any time (effective at the end of the month) by phone call to the office staff.  Patient agreed to services and verbal consent obtained.   The patient verbalized understanding of instructions, educational materials, and care plan provided today and declined offer to receive copy of patient instructions, educational materials, and care plan.   Telephone follow up appointment with care management team member scheduled for: 12/03/2020 at 10am  [name] MSW, LCSW Licensed Clinical Social Worker [employer] [phone]

## 2020-11-12 NOTE — Progress Notes (Signed)
Spoke to patient to confirmed patient telephone appointment on 11/13/2020 for CCM at 11:00 am with Angelena Sole the Clinical pharmacist.   Patient verbalized understanding   Patient reports having a side effect with some of his medication and prefers to wait until appointment to discuss it with the clinical pharmacist.  Everlean Cherry Clinical Pharmacist Assistant [phone]

## 2020-11-13 ENCOUNTER — Ambulatory Visit: Payer: Medicare Other

## 2020-11-13 ENCOUNTER — Telehealth: Payer: Self-pay

## 2020-11-13 ENCOUNTER — Telehealth: Payer: Medicare Other

## 2020-11-13 NOTE — Progress Notes (Deleted)
Chronic Care Management Pharmacy Note  11/13/2020 Name:  Kenneth [name] MRN:  [id number] DOB:  Jul 22, 1961  Subjective: Kenneth [name] is an 60 y.o. year old male who is a primary patient of [name], MD.  The CCM team was consulted for assistance with disease management and care coordination needs.    Engaged with patient by telephone for follow up visit in response to provider referral for pharmacy case management and/or care coordination services.   Consent to Services:  The patient was given the following information about Chronic Care Management services today, agreed to services, and gave verbal consent: 1. CCM service includes personalized support from designated clinical staff supervised by the primary care provider, including individualized plan of care and coordination with other care providers 2. 24/7 contact phone numbers for assistance for urgent and routine care needs. 3. Service will only be billed when office clinical staff spend 20 minutes or more in a month to coordinate care. 4. Only one practitioner may furnish and bill the service in a calendar month. 5.The patient may stop CCM services at any time (effective at the end of the month) by phone call to the office staff. 6. The patient will be responsible for cost sharing (co-pay) of up to 20% of the service fee (after annual deductible is met). Patient agreed to services and consent obtained.  Patient Care Team: [name], MD as PCP - General (Family Medicine) Buford Dresser, MD as PCP - Cardiology (Cardiology) Germaine Pomfret, [facility] as Pharmacist (Pharmacist) Madison Hickman, NP as Nurse Practitioner (Psychiatry) Dannielle Karvonen, RN as Case Manager Deirdre Peer, LCSW as Social Worker (Licensed Clinical Social Worker)  Recent office visits: 10/29/20: Video visit with Kenneth Lacy, NP for hospital follow-up.   Recent consult visits: 1/31-2/2: Patient hospitalized for COPD  exacerbation. Patient prescribed Daliresp (cannot afford). Patient assistance started. Patient started on hydralazine 25 mg TID.   Hospital visits: {Hospital DC Yes/No:25215}  Objective:  Lab Results  Component Value Date   CREATININE 1.51 (H) 10/30/2020   BUN 16 10/30/2020   GFR 50.31 (L) 10/30/2020   GFRNONAA >60 10/24/2020   GFRAA 85 07/05/2020   NA 132 (L) 10/30/2020   K 4.9 10/30/2020   CALCIUM 9.7 10/30/2020   CO2 40 (H) 10/30/2020    Lab Results  Component Value Date/Time   HGBA1C 6.3 (H) 09/13/2020 04:08 AM   HGBA1C 6.8 (H) 06/21/2020 09:14 AM   GFR 50.31 (L) 10/30/2020 08:00 AM   GFR 44.62 (L) 09/25/2020 11:54 AM   MICROALBUR <0.7 07/06/2018 01:55 PM    Last diabetic Eye exam: No results found for: HMDIABEYEEXA  Last diabetic Foot exam: No results found for: HMDIABFOOTEX   Lab Results  Component Value Date   CHOL 226 (H) 07/06/2018   HDL 68.40 07/06/2018   LDLCALC 119 (H) 07/06/2018   TRIG 275 (H) 10/05/2020   CHOLHDL 3 07/06/2018    Hepatic Function Latest Ref Rng & Units 10/23/2020 10/22/2020 10/05/2020  Total Protein 6.5 - 8.1 g/dL 6.2(L) 5.9(L) 5.2(L)  Albumin 3.5 - 5.0 g/dL 3.4(L) 3.4(L) 2.7(L)  AST 15 - 41 U/L 32 45(H) 24  ALT 0 - 44 U/L 34 34 34  Alk Phosphatase 38 - 126 U/L 138(H) 127(H) 58  Total Bilirubin 0.3 - 1.2 mg/dL 0.8 0.6 0.7  Bilirubin, Direct 0.0 - 0.2 mg/dL - - -    Lab Results  Component Value Date/Time   TSH 4.32 07/06/2018 01:55 PM    CBC Latest  Ref Rng & Units 10/30/2020 10/24/2020 10/23/2020  WBC 4.0 - 10.5 K/uL 9.6 11.8(H) 11.6(H)  Hemoglobin 13.0 - 17.0 g/dL 11.0(L) 9.4(L) 9.2(L)  Hematocrit 39.0 - 52.0 % 34.2(L) 30.0(L) 31.6(L)  Platelets 150.0 - 400.0 K/uL 240.0 260 262    No results found for: VD25OH  Clinical ASCVD: {YES/NO:21197} The 10-year ASCVD risk score Mikey Bussing DC Jr., et al., 2013) is: 41.2%   Values used to calculate the score:     Age: 15 years     Sex: Male     Is Non-Hispanic African American: Yes      Diabetic: Yes     Tobacco smoker: Yes     Systolic Blood Pressure: 542 mmHg     Is BP treated: Yes     HDL Cholesterol: 68.4 mg/dL     Total Cholesterol: 226 mg/dL    Depression screen [facility] 2/9 11/07/2020 09/25/2020 08/27/2020  Decreased Interest 1 1 0  Down, Depressed, Hopeless 1 0 0  PHQ - 2 Score 2 1 0  Altered sleeping 3 - -  Tired, decreased energy 2 - -  Change in appetite 2 - -  Feeling bad or failure about yourself  1 - -  Trouble concentrating 1 - -  Moving slowly or fidgety/restless 1 - -  Suicidal thoughts 0 - -  PHQ-9 Score 12 - -  Difficult doing work/chores Somewhat difficult - -     ***Other: (CHADS2VASc if Afib, MMRC or CAT for COPD, ACT, DEXA)  Social History   Tobacco Use  Smoking Status Former Smoker  . Packs/day: 2.00  . Years: 30.00  . Pack years: 60.00  . Types: Cigarettes  . Quit date: 05/01/2019  . Years since quitting: 1.5  Smokeless Tobacco Never Used  Tobacco Comment   smokes 2-3cigs per day   BP Readings from Last 3 Encounters:  10/29/20 (!) 143/68  10/24/20 134/88  10/22/20 128/82   Pulse Readings from Last 3 Encounters:  10/24/20 70  10/22/20 78  10/13/20 60   Wt Readings from Last 3 Encounters:  10/29/20 193 lb (87.5 kg)  10/22/20 193 lb 9 oz (87.8 kg)  10/22/20 193 lb 9.6 oz (87.8 kg)    Assessment/Interventions: Review of patient past medical history, allergies, medications, health status, including review of consultants reports, laboratory and other test data, was performed as part of comprehensive evaluation and provision of chronic care management services.   SDOH:  (Social Determinants of Health) assessments and interventions performed: Yes   CCM Care Plan  Allergies  Allergen Reactions  . Lisinopril Swelling  . Other Other (See Comments)    Lettuce : rash  . Tomato Rash    Medications Reviewed Today    Reviewed by Deirdre Peer, LCSW (Social Worker) on 11/12/20 at 1137  Med List Status: <None>  Medication Order  Taking? Sig Documenting Provider Last Dose Status Informant  acetaminophen (TYLENOL) 325 MG tablet [id number]  Take 650 mg by mouth every 6 (six) hours as needed for mild pain or headache. [provider]  Active Self  albuterol (PROVENTIL) (2.5 MG/3ML) 0.083% nebulizer solution [id number]  INHALE 1 VIAL VIA NEBULIZER 5 TIMES DAILY AS NEEDED FOR WHEEZING OR FOR SHORTNESS OF BREATH  Patient taking differently: Take 2.5 mg by nebulization every 6 (six) hours as needed for shortness of breath.   Debbe Odea, MD  Active   ARIPiprazole (ABILIFY) 5 MG tablet [id number] Yes Take 5 mg by mouth daily. [provider] Taking Active Self  Med Note Michaelle Birks, Messi Twedt A   Mon Oct 29, 2020  8:13 AM) Prescribed by  Madison Hickman, NP   Blood Glucose Monitoring Suppl (ONE TOUCH ULTRA 2) w/Device KIT [id number]  Use to test blood sugars 1-2 times daily. [name], MD  Active Self  busPIRone (BUSPAR) 15 MG tablet [id number]  Take 1 tablet (15 mg total) by mouth 2 (two) times daily. [name], MD  Active Self           Med Note Michaelle Birks, Deitra Craine A   Mon Oct 29, 2020  8:13 AM) Prescribed by  Madison Hickman, NP    carvedilol (COREG) 12.5 MG tablet [id number]  Take 1 tablet (12.5 mg total) by mouth 2 (two) times daily with a meal. Buford Dresser, MD  Active Self           Med Note ([place], Ned Clines E   Tue Oct 23, 2020  1:21 PM)    DM-GG & DM-APAP-CPM (CORICIDIN HBP DAY/NIGHT COLD) 10-20 &15-200-2 MG MISC [id number]  Take 1 tablet by mouth every 12 (twelve) hours as needed (cold symptoms). [provider]  Active Self  ELIQUIS 5 MG TABS tablet [id number]  TAKE ONE TABLET BY MOUTH EVERY MORNING and TAKE ONE TABLET BY MOUTH EVERY Janace Litten, MD  Active   famotidine (PEPCID) 20 MG tablet [id number]  Take 20 mg by mouth daily. [provider]  Active Self  FLUoxetine (PROZAC) 20 MG capsule [id number]  TAKE 3 CAPSULES BY MOUTH   DAILY [name], MD  Active            Med Note Michaelle Birks, Travers Goodley A   Mon Oct 29, 2020  8:15 AM) Prescribed by  Madison Hickman, NP   Fluticasone-Umeclidin-Vilant (TRELEGY ELLIPTA) 100-62.5-25 MCG/INH AEPB [id number]  Inhale 1 puff into the lungs daily. Collene Gobble, MD  Active Self           Med Note ([place], [place] Oct 23, 2020  1:23 PM)    furosemide (LASIX) 40 MG tablet [id number]  Take 1 tablet (40 mg total) by mouth 2 (two) times daily. Buford Dresser, MD  Active Self           Med Note ([place], [place] Oct 23, 2020  1:23 PM)    gabapentin (NEURONTIN) 300 MG capsule [id number] Yes TAKE ONE CAPSULE BY MOUTH THREE TIMES DAILY  Patient taking differently: Take 300 mg by mouth 3 (three) times daily.   [name], MD Taking Active            Med Note Marcelline Deist, Paulita Cradle Nov 12, 2020 11:37 AM) Pt has questions and concerns about this RX and it possibly causing him to have "trouble holding things"  glucose blood (ONETOUCH ULTRA) test strip [id number]  USE TO TEST BLOOD SUGAR 1-2 TIMES DAILY [name], MD  Active Self  guaiFENesin (MUCINEX) 600 MG 12 hr tablet [id number]  Take 1 tablet (600 mg total) by mouth 2 (two) times daily. Elmarie Shiley, MD  Active Self           Med Note (SATTERFIELD, DARIUS E   Tue Oct 23, 2020  1:22 PM)    hydrALAZINE (APRESOLINE) 25 MG tablet [id number]  Take 1 tablet (25 mg total) by mouth 3 (three) times daily. Barb Merino, MD  Active   insulin glargine (LANTUS SOLOSTAR) 100 UNIT/ML Solostar Pen [id number]  Inject 10 Units into the  skin at bedtime. Nche, Charlene Brooke, NP  Active   Insulin Pen Needle (PEN NEEDLES) 31G X 6 MM MISC [id number]  1 application by Does not apply route at bedtime. Nche, Charlene Brooke, NP  Active   ipratropium-albuterol (DUONEB) 0.5-2.5 (3) MG/3ML SOLN [id number]  Take 3 mLs by nebulization every 6 (six) hours as needed.  Patient taking differently: Take 3 mLs by  nebulization every 6 (six) hours as needed (For shortness of breath).   Elmarie Shiley, MD  Active            Med Note (SATTERFIELD, DARIUS E   Tue Oct 23, 2020  1:22 PM)    isosorbide mononitrate (IMDUR) 30 MG 24 hr tablet [id number]  Take 30 mg by mouth daily. [provider]  Active Self           Med Note (S[place]TERFIELD, Armstead Peaks   Tue Oct 23, 2020  1:24 PM)    Lancets [facility] ULTRASOFT) lancets [id number]  USE TO TEST BLOOD SUGAR 1-2 TIMES DAILY [name], MD  Active   lubiprostone [facility]) 8 MCG capsule [id number]  Take 1 capsule (8 mcg total) by mouth 2 (two) times daily with a meal. [name], MD  Active   Multiple Vitamin (MULTIVITAMIN WITH MINERALS) TABS tablet [id number]  Take 1 tablet by mouth daily. [provider]  Active Self  nitroGLYCERIN (NITROSTAT) 0.4 MG SL tablet [id number]  DISSOLVE 1 TABLET UNDER THE TONGUE EVERY 5 MINUTES AS&nbsp;&nbsp;NEEDED FOR CHEST PAIN. MAX&nbsp;&nbsp;OF 3 TABLETS IN 15 MINUTES. CALL 911 IF PAIN PERSISTS.  Patient taking differently: Place 0.4 mg under the tongue every 5 (five) minutes as needed for chest pain.   [name], MD  Active            Med Note Alvino Chapel, Melanee Left Oct 04, 2020  1:52 PM) Last filled 07/25/20  pantoprazole (PROTONIX) 40 MG tablet [id number]  TAKE ONE TABLET BY MOUTH EVERY MORNING Esterwood, Amy S, PA-C  Active   potassium chloride SA (KLOR-CON) 20 MEQ tablet [id number]  Take 2 tablets (40 mEq total) by mouth daily. Barb Merino, MD  Active   predniSONE (DELTASONE) 10 MG tablet [id number]  Take 1 tablet (10 mg total) by mouth daily with breakfast. Parrett, Tammy S, NP  Active Self  Roflumilast (DALIRESP) 250 MCG TABS [id number]  Take 250 mcg by mouth daily.  Patient not taking: Reported on 11/07/2020   Melvenia Needles, NP  Active   rosuvastatin (CRESTOR) 20 MG tablet [id number]  TAKE ONE TABLET BY MOUTH ONCE DAILY Dutch Quint B, FNP  Active   spironolactone  (ALDACTONE) 25 MG tablet [id number]  Take 1 tablet (25 mg total) by mouth daily. Barb Merino, MD  Active           Patient Active Problem List   Diagnosis Date Noted  . Acute on chronic respiratory failure with hypoxia ([place]) 10/23/2020  . Cardiac arrest ([place]) 10/04/2020  . COPD with acute exacerbation ([place]) 09/23/2020  . Acute kidney injury superimposed on CKD ([place]) 09/23/2020  . Thiamine deficiency 07/02/2020  . Obstructive sleep apnea 05/02/2020  . Hyperlipidemia   . Acute on chronic combined systolic (congestive) and diastolic (congestive) heart failure ([place]) 04/07/2020  . Chronic constipation 07/19/2019  . Hospital discharge follow-up 06/17/2019  . Shortness of breath 06/12/2019  . Snoring 03/29/2019  . Insomnia due to other mental disorder 11/15/2018  . Anticoagulant long-term use 10/21/2018  . Gastroesophageal reflux disease without esophagitis  08/10/2018  . Tobacco abuse 08/10/2018  . Chest pain 07/27/2018  . Mild renal insufficiency 07/27/2018  . Essential hypertension 07/06/2018  . Chronic obstructive pulmonary disease ([place]) 07/06/2018  . Controlled type 2 diabetes mellitus without complication, without long-term current use of insulin ([place]) 07/06/2018  . Healthcare maintenance 07/06/2018  . Alcohol abuse 07/06/2018  . Paroxysmal atrial fibrillation ([place]) 11/28/2017  . Acute on chronic systolic heart failure ([place]) 11/28/2017  . Slow transit constipation 06/07/2016  . Chronic anemia 06/06/2016  . Hypertensive kidney disease with chronic kidney disease stage III ([place]) 06/06/2016  . Mixed anxiety and depressive disorder 06/05/2016  . Type 2 diabetes mellitus, without long-term current use of insulin ([place]) 06/05/2016  . Chronic obstructive pulmonary disease with (acute) exacerbation ([place]) 06/05/2016    Immunization History  Administered Date(s) Administered  . Influenza,inj,Quad PF,6+ Mos 06/25/2018, 05/20/2019  . Influenza-Unspecified 06/28/2020  . PFIZER(Purple  Top)SARS-COV-2 Vaccination 12/22/2019, 01/16/2020  . Pneumococcal Polysaccharide-23 06/08/2016  . Zoster Recombinat (Shingrix) 07/22/2019    Conditions to be addressed/monitored:  Hypertension, Hyperlipidemia, Diabetes, Atrial Fibrillation, Heart Failure, GERD, COPD and Depression  There are no care plans that you recently modified to display for this patient.    Medication Assistance: {MEDASSISTANCEINFO:25044}  Patient's preferred pharmacy is:  [facility] - [place], [place] - [address] [place] [address] Phone: [phone] Fax: [phone]  Uses pill box? {Yes or If no, why not?:20788} Pt endorses ***% compliance  We discussed: {Pharmacy options:24294} Patient decided to: {US Pharmacy Plan:23885}  Care Plan and Follow Up Patient Decision:  {FOLLOWUP:24991}  Plan: {CM FOLLOW UP PLAN:25073}  ***   Current Barriers:  . {pharmacybarriers:24917} . ***  Pharmacist Clinical Goal(s):  [name] Over the next *** days, patient will {PHARMACYGOALCHOICES:24921} through collaboration with PharmD and provider.  . ***  Interventions: . 1:1 collaboration with [name], MD regarding development and update of comprehensive plan of care as evidenced by provider attestation and co-signature . Inter-disciplinary care team collaboration (see longitudinal plan of care) . Comprehensive medication review performed; medication list updated in electronic medical record  Hyperlipidemia: (LDL goal < 70) -controlled -Current treatment: . Rosuvastatin 5 mg daily  -Medications previously tried: NA  -Current dietary patterns: *** -Current exercise habits: *** -Educated on {CCM HLD Counseling:25126} -{CCMPHARMDINTERVENTION:25122}  Diabetes (A1c goal <8%) -controlled -Current medications: [name] Metformin 500 mg twice daily . Lantus 10 units daily (Not started)   -Medications previously tried: ***  -Current home glucose  readings . fasting glucose: *** . post prandial glucose: *** -{ACTIONS;DENIES/REPORTS:21021675::"Denies"} hypoglycemic/hyperglycemic symptoms -Current meal patterns:  . breakfast: ***  . lunch: ***  . dinner: *** . snacks: *** . drinks: *** -Current exercise: *** -Educated on{CCM DM COUNSELING:25123} -Counseled to check feet daily and get yearly eye exams -{CCMPHARMDINTERVENTION:25122}  Atrial Fibrillation (Goal: prevent stroke and major bleeding) -{CHL Controlled/Uncontrolled:[phone]}  -CHADSVASC: *** -Current treatment: . Rate control: Carvedilol 12.5 mg twice daily  . Anticoagulation: Eliquis 5 mg twice daily  -Medications previously tried: *** -Home BP and HR readings: ***  -Counseled on {CCMAFIBCOUNSELING:25120} -{CCMPHARMDINTERVENTION:25122}  Heart Failure (Goal: control symptoms and prevent exacerbations) uncontrolled Type: Systolic -NYHA Class: III (marked limitation of activity) -Ejection fraction: 45-50% (Date: 04/07/20) -Current treatment:  Carvedilol 12.5 mg twice daily   Furosemide 40 mg twice daily   Hydralazine 25 mg three times daily   Imdur 30 mg daily   Spironolactone 25 mg daily   -Medications previously tried: Lisinopril (angioedema) -Current home BP/HR readings: *** -  Current dietary habits: *** -Current exercise routine: *** -Educated on {CCM HF Counseling:25125} -{CCMPHARMDINTERVENTION:25122}  COPD (Goal: control symptoms and prevent exacerbations) -uncontrolled -Current treatment  . Trelegy 1 puff daily  . DuoNeb 3 mL Nebulizer every 6 hours as needed  . Daliresp 250 mcg daily (not started)  -Medications previously tried: ***  -Gold Grade: {CHL HP [facility] COPD Gold HVFMB:[id number]} -Current COPD Classification:  {CHL HP [facility] COPD Classification:[phone]} -MMRC/CAT score: *** -Pulmonary function testing: *** -Exacerbations requiring treatment in last 6 months: *** -Patient {Actions; denies-reports:120008}  consistent use of maintenance inhaler -Frequency of rescue inhaler use: *** -Counseled on {CCMINHALERCOUNSELING:25121} -{CCMPHARMDINTERVENTION:25122}  Depression/Anxiety (Goal: ***) -{CHL Controlled/Uncontrolled:[phone]} -Current treatment: . *** -Medications previously tried/failed: *** -PHQ9: *** -GAD7: *** -Connected with *** for mental health support -Educated on {CCM mental health counseling:25127} -{CCMPHARMDINTERVENTION:25122}   Patient Goals/Self-Care Activities . Over the next *** days, patient will:  - {pharmacypatientgoals:24919}  Follow Up Plan: {CM FOLLOW UP CVKF:84037}

## 2020-11-13 NOTE — Telephone Encounter (Signed)
  Care Management   Outreach Note  11/13/2020 Name: Kenneth [name] MRN: [id number] DOB: Jul 05, 1961  Referred by: Mliss Sax, MD Reason for referral : Care Coordination (Telephone follow up outreach )  Social worker request received to follow up with patient regarding elevated blood sugar concerns.  An unsuccessful telephone outreach was attempted today. The patient was referred to the case management team for assistance with care management and care coordination.   Follow Up Plan: The care management team will reach out to the patient again over the next 1 week.   George Ina RN,BSN,CCM RN Case Manager Corinda Gubler [place]  [phone]

## 2020-11-13 NOTE — Progress Notes (Signed)
This encounter was created in error - please disregard.

## 2020-11-13 NOTE — Telephone Encounter (Signed)
Spoke with patient to complete Medicare Wellness exam. Patient states his blood sugar was 546 this morning & he says it has been over 500 since he was discharged from the hospital last month. He states they took him off of his Metformin while he was in the hospital & he is only taking Lantus 10 units at night. Consulted with Dr. Veto Kemps. Per Dr. Veto Kemps, patient instructed to increase Lantus to 20 units at bedtime & to increase fluid intake. Appointment made with Dr. Veto Kemps for tomorrow(11/14/20) at 8:00am. Patient states he has had some dizziness. Patient instructed to go to the ER if his dizziness worsens or he develops any new symptoms such as vomiting or confusion. Patient voiced understanding & he has someone in the home with him to help him if needed. Medicare Wellness appt cancelled for today & we will reschedule at a later date.

## 2020-11-14 ENCOUNTER — Ambulatory Visit (INDEPENDENT_AMBULATORY_CARE_PROVIDER_SITE_OTHER): Payer: Medicare Other | Admitting: Family Medicine

## 2020-11-14 ENCOUNTER — Encounter: Payer: Self-pay | Admitting: Family Medicine

## 2020-11-14 ENCOUNTER — Other Ambulatory Visit: Payer: Self-pay

## 2020-11-14 ENCOUNTER — Telehealth: Payer: Self-pay

## 2020-11-14 ENCOUNTER — Telehealth: Payer: Medicare Other

## 2020-11-14 VITALS — BP 146/74 | HR 67 | Temp 97.1°F | Ht 72.0 in | Wt 187.2 lb

## 2020-11-14 DIAGNOSIS — E1140 Type 2 diabetes mellitus with diabetic neuropathy, unspecified: Secondary | ICD-10-CM

## 2020-11-14 DIAGNOSIS — Z794 Long term (current) use of insulin: Secondary | ICD-10-CM | POA: Diagnosis not present

## 2020-11-14 LAB — GLUCOSE, POCT (MANUAL RESULT ENTRY): POC Glucose: 405 mg/dl — AB (ref 70–99)

## 2020-11-14 MED ORDER — LANTUS SOLOSTAR 100 UNIT/ML ~~LOC~~ SOPN
20.0000 [IU] | PEN_INJECTOR | Freq: Every day | SUBCUTANEOUS | 2 refills | Status: DC
Start: 2020-11-14 — End: 2020-11-22

## 2020-11-14 NOTE — Progress Notes (Signed)
[facility] [address] [place] [address] Dept: [phone] Dept Fax: [phone]  Acute Office Visit  Subjective:    Patient ID: Kenneth [name], male    DOB: 11-17-1960, 60 y.o..   MRN: [id number]  Chief Complaint  Patient presents with  . Diabetes    Concerns about elevated blood sugar levels.     History of Present Illness:  Patient is in today for evaluation of hyperglycemia. Kenneth [name] has a long-standing history of Type 2 DM, complicated by CVD, COPD, and CKD. He was recently in the hospital for an exacerbation of heart failure. At discharge, his metformin had been stopped (presumably due to its negative impact on CHF) and he had been started on Lantus 10 units qhs for diabetes management. Since discharge he has noted his blood sugars to be running > 500. Yesterday, he had an annual wellness visit with Caroleen Hamman, LPN. On my recommendation, she had his start taking 20 units of Lantus last night. Kenneth [name] continues to have high blood sugars today. He has had some polyuria and mild orthostasis associated with this.  Past Medical History: Patient Active Problem List   Diagnosis Date Noted  . Acute on chronic respiratory failure with hypoxia ([place]) 10/23/2020  . Cardiac arrest ([place]) 10/04/2020  . COPD with acute exacerbation ([place]) 09/23/2020  . Acute kidney injury superimposed on CKD ([place]) 09/23/2020  . Thiamine deficiency 07/02/2020  . Obstructive sleep apnea 05/02/2020  . Hyperlipidemia   . Acute on chronic combined systolic (congestive) and diastolic (congestive) heart failure ([place]) 04/07/2020  . Chronic constipation 07/19/2019  . Hospital discharge follow-up 06/17/2019  . Shortness of breath 06/12/2019  . Snoring 03/29/2019  . Insomnia due to other mental disorder 11/15/2018  . Anticoagulant long-term use 10/21/2018  . Gastroesophageal reflux disease without esophagitis 08/10/2018  . Tobacco abuse 08/10/2018   . Chest pain 07/27/2018  . Mild renal insufficiency 07/27/2018  . Essential hypertension 07/06/2018  . Chronic obstructive pulmonary disease ([place]) 07/06/2018  . Controlled type 2 diabetes mellitus without complication, without long-term current use of insulin ([place]) 07/06/2018  . Healthcare maintenance 07/06/2018  . Alcohol abuse 07/06/2018  . Paroxysmal atrial fibrillation ([place]) 11/28/2017  . Acute on chronic systolic heart failure ([place]) 11/28/2017  . Slow transit constipation 06/07/2016  . Chronic anemia 06/06/2016  . Hypertensive kidney disease with chronic kidney disease stage III ([place]) 06/06/2016  . Mixed anxiety and depressive disorder 06/05/2016  . Type 2 diabetes mellitus, without long-term current use of insulin ([employer]) 06/05/2016  . Chronic obstructive pulmonary disease with (acute) exacerbation ([place]) 06/05/2016   Past Surgical History:  Procedure Laterality Date  . NO PAST SURGERIES     Family History  Problem Relation Age of Onset  . Diabetes Mother   . Heart disease Mother   . Diabetes Father   . Heart disease Father   . Diabetes Sister   . Heart disease Sister   . Kidney disease Sister   . Coronary artery disease Brother   . Liver disease Maternal Uncle    Outpatient Medications Prior to Visit  Medication Sig Dispense Refill  . acetaminophen (TYLENOL) 325 MG tablet Take 650 mg by mouth every 6 (six) hours as needed for mild pain or headache.    . albuterol (PROVENTIL) (2.5 MG/3ML) 0.083% nebulizer solution INHALE 1 VIAL VIA NEBULIZER 5 TIMES DAILY AS NEEDED FOR WHEEZING OR FOR SHORTNESS OF BREATH (Patient taking differently: Take 2.5 mg by nebulization every 6 (six)  hours as needed for shortness of breath.) 150 mL 1  . ARIPiprazole (ABILIFY) 5 MG tablet Take 5 mg by mouth daily.    . Blood Glucose Monitoring Suppl (ONE TOUCH ULTRA 2) w/Device KIT Use to test blood sugars 1-2 times daily. 1 kit 0  . busPIRone (BUSPAR) 15 MG tablet Take 1 tablet (15 mg total) by mouth  2 (two) times daily. 180 tablet 1  . carvedilol (COREG) 12.5 MG tablet Take 1 tablet (12.5 mg total) by mouth 2 (two) times daily with a meal. 180 tablet 3  . DM-GG & DM-APAP-CPM (CORICIDIN HBP DAY/NIGHT COLD) 10-20 &15-200-2 MG MISC Take 1 tablet by mouth every 12 (twelve) hours as needed (cold symptoms).    [name] ELIQUIS 5 MG TABS tablet TAKE ONE TABLET BY MOUTH EVERY MORNING and TAKE ONE TABLET BY MOUTH EVERY EVENING 180 tablet 1  . famotidine (PEPCID) 20 MG tablet Take 20 mg by mouth daily.    [name] FLUoxetine (PROZAC) 20 MG capsule TAKE 3 CAPSULES BY MOUTH  DAILY 270 capsule 3  . Fluticasone-Umeclidin-Vilant (TRELEGY ELLIPTA) 100-62.5-25 MCG/INH AEPB Inhale 1 puff into the lungs daily. 60 each 5  . furosemide (LASIX) 40 MG tablet Take 1 tablet (40 mg total) by mouth 2 (two) times daily. 60 tablet 11  . gabapentin (NEURONTIN) 300 MG capsule TAKE ONE CAPSULE BY MOUTH THREE TIMES DAILY (Patient taking differently: Take 300 mg by mouth 3 (three) times daily.) 90 capsule 2  . glucose blood (ONETOUCH ULTRA) test strip USE TO TEST BLOOD SUGAR 1-2 TIMES DAILY 100 each 11  . guaiFENesin (MUCINEX) 600 MG 12 hr tablet Take 1 tablet (600 mg total) by mouth 2 (two) times daily. 30 tablet 0  . hydrALAZINE (APRESOLINE) 25 MG tablet Take 1 tablet (25 mg total) by mouth 3 (three) times daily. 90 tablet 0  . Insulin Pen Needle (PEN NEEDLES) 31G X 6 MM MISC 1 application by Does not apply route at bedtime. 100 each 1  . ipratropium-albuterol (DUONEB) 0.5-2.5 (3) MG/3ML SOLN Take 3 mLs by nebulization every 6 (six) hours as needed. (Patient taking differently: Take 3 mLs by nebulization every 6 (six) hours as needed (For shortness of breath).) 360 mL 0  . isosorbide mononitrate (IMDUR) 30 MG 24 hr tablet Take 30 mg by mouth daily.    . Lancets (ONETOUCH ULTRASOFT) lancets USE TO TEST BLOOD SUGAR 1-2 TIMES DAILY 100 each 11  . lubiprostone ([name]) 8 MCG capsule Take 1 capsule (8 mcg total) by mouth 2 (two) times daily  with a meal. 60 capsule 0  . Multiple Vitamin (MULTIVITAMIN WITH MINERALS) TABS tablet Take 1 tablet by mouth daily.    . nitroGLYCERIN (NITROSTAT) 0.4 MG SL tablet DISSOLVE 1 TABLET UNDER THE TONGUE EVERY 5 MINUTES AS&nbsp;&nbsp;NEEDED FOR CHEST PAIN. MAX&nbsp;&nbsp;OF 3 TABLETS IN 15 MINUTES. CALL 911 IF PAIN PERSISTS. (Patient taking differently: Place 0.4 mg under the tongue every 5 (five) minutes as needed for chest pain.) 75 tablet 4  . pantoprazole (PROTONIX) 40 MG tablet TAKE ONE TABLET BY MOUTH EVERY MORNING 30 tablet 4  . potassium chloride SA (KLOR-CON) 20 MEQ tablet Take 2 tablets (40 mEq total) by mouth daily. 60 tablet 0  . predniSONE (DELTASONE) 10 MG tablet Take 1 tablet (10 mg total) by mouth daily with breakfast. 30 tablet 2  . rosuvastatin (CRESTOR) 20 MG tablet TAKE ONE TABLET BY MOUTH ONCE DAILY 30 tablet 3  . spironolactone (ALDACTONE) 25 MG tablet Take 1 tablet (25 mg total)  by mouth daily. 30 tablet 0  . insulin glargine (LANTUS SOLOSTAR) 100 UNIT/ML Solostar Pen Inject 10 Units into the skin at bedtime. 15 mL 1  . amLODipine (NORVASC) 5 MG tablet Take 5 mg by mouth every morning.    . Roflumilast (DALIRESP) 250 MCG TABS Take 250 mcg by mouth daily. (Patient not taking: No sig reported) 30 tablet 11   No facility-administered medications prior to visit.   Allergies  Allergen Reactions  . Lisinopril Swelling  . Other Other (See Comments)    Lettuce : rash  . Tomato Rash     Objective:   Today's Vitals   11/14/20 0808  BP: (!) 146/74  Pulse: 67  Temp: (!) 97.1 F (36.2 C)  TempSrc: Temporal  SpO2: 94%  Weight: 187 lb 3.2 oz (84.9 kg)  Height: 6' (1.829 m)   Body mass index is 25.39 kg/m.   General: Well developed, well nourished. No acute distress. Psych: Alert and oriented. Normal mood and affect.  Health Maintenance Due  Topic Date Due  . COLONOSCOPY (Pts 45-53yr Insurance coverage will need to be confirmed)  09/23/2019  . OPHTHALMOLOGY EXAM   05/12/2020   Lab Results Lab Results  Component Value Date   POCGLU 405 (A) 11/14/2020      Assessment & Plan:   1. Type 2 diabetes mellitus with diabetic neuropathy, with long-term current use of insulin ([place] I provided Mr. DGahanwith printed instructions regarding tiotrating up on his insulin. He should also push his water intake this next week. I recommend we see him weekly until we have his blood sugars back under control.  - POCT Glucose (CBG) - insulin glargine (LANTUS SOLOSTAR) 100 UNIT/ML Solostar Pen; Inject 20 Units into the skin at bedtime. If fasting blood sugars remain >150 for 3 days, increase insulin dose by 4 units. Maximum of 80 units.  Dispense: 15 mL; Refill: 2  SHaydee Salter MD

## 2020-11-14 NOTE — Telephone Encounter (Signed)
  Care Management   Outreach Note  11/14/2020 Name: Kenneth [name] MRN: [id number] DOB: Feb 17, 1961  Referred by: Mliss Sax, MD Reason for referral : Care Coordination (2nd telephone outreach follow up)   A second unsuccessful telephone outreach was attempted today. The patient was referred to the case management team for assistance with care management and care coordination.   Follow Up Plan: A HIPAA compliant phone message was left for the patient providing contact information and requesting a return call.  The care management team will reach out to the patient again within 1 week.   George Ina RN,BSN,CCM RN Case Manager Kenneth [name] [phone]

## 2020-11-15 ENCOUNTER — Ambulatory Visit: Payer: Medicare Other | Admitting: Cardiology

## 2020-11-15 DIAGNOSIS — J441 Chronic obstructive pulmonary disease with (acute) exacerbation: Secondary | ICD-10-CM | POA: Diagnosis not present

## 2020-11-15 DIAGNOSIS — I5042 Chronic combined systolic (congestive) and diastolic (congestive) heart failure: Secondary | ICD-10-CM | POA: Diagnosis not present

## 2020-11-15 DIAGNOSIS — R269 Unspecified abnormalities of gait and mobility: Secondary | ICD-10-CM | POA: Diagnosis not present

## 2020-11-15 DIAGNOSIS — M6281 Muscle weakness (generalized): Secondary | ICD-10-CM | POA: Diagnosis not present

## 2020-11-15 DIAGNOSIS — I219 Acute myocardial infarction, unspecified: Secondary | ICD-10-CM | POA: Diagnosis not present

## 2020-11-15 DIAGNOSIS — E119 Type 2 diabetes mellitus without complications: Secondary | ICD-10-CM | POA: Diagnosis not present

## 2020-11-15 DIAGNOSIS — I480 Paroxysmal atrial fibrillation: Secondary | ICD-10-CM | POA: Diagnosis not present

## 2020-11-15 DIAGNOSIS — I10 Essential (primary) hypertension: Secondary | ICD-10-CM | POA: Diagnosis not present

## 2020-11-16 ENCOUNTER — Telehealth: Payer: Medicare Other

## 2020-11-19 DIAGNOSIS — J449 Chronic obstructive pulmonary disease, unspecified: Secondary | ICD-10-CM | POA: Diagnosis not present

## 2020-11-20 ENCOUNTER — Telehealth: Payer: Self-pay | Admitting: Family Medicine

## 2020-11-20 ENCOUNTER — Telehealth: Payer: Self-pay

## 2020-11-20 DIAGNOSIS — I5042 Chronic combined systolic (congestive) and diastolic (congestive) heart failure: Secondary | ICD-10-CM | POA: Diagnosis not present

## 2020-11-20 DIAGNOSIS — E119 Type 2 diabetes mellitus without complications: Secondary | ICD-10-CM | POA: Diagnosis not present

## 2020-11-20 DIAGNOSIS — I480 Paroxysmal atrial fibrillation: Secondary | ICD-10-CM | POA: Diagnosis not present

## 2020-11-20 DIAGNOSIS — J441 Chronic obstructive pulmonary disease with (acute) exacerbation: Secondary | ICD-10-CM | POA: Diagnosis not present

## 2020-11-20 DIAGNOSIS — M6281 Muscle weakness (generalized): Secondary | ICD-10-CM | POA: Diagnosis not present

## 2020-11-20 DIAGNOSIS — I219 Acute myocardial infarction, unspecified: Secondary | ICD-10-CM | POA: Diagnosis not present

## 2020-11-20 DIAGNOSIS — I10 Essential (primary) hypertension: Secondary | ICD-10-CM | POA: Diagnosis not present

## 2020-11-20 DIAGNOSIS — R269 Unspecified abnormalities of gait and mobility: Secondary | ICD-10-CM | POA: Diagnosis not present

## 2020-11-20 NOTE — Telephone Encounter (Signed)
Bessie from [facility] calling requesting refill for spironolactone (ALDACTONE) 25 MG tablet for pt. Please advise

## 2020-11-20 NOTE — Progress Notes (Signed)
Patient requested refill for Spironolactone 25 mg. Completed acute form and sent to clinical pharmacist for review. Reached out to cardiology and PCP to request refill on 11/20/2020.  Everlean Cherry Clinical Pharmacist Assistant [phone]

## 2020-11-22 ENCOUNTER — Telehealth: Payer: Self-pay

## 2020-11-22 ENCOUNTER — Other Ambulatory Visit: Payer: Self-pay

## 2020-11-22 ENCOUNTER — Encounter: Payer: Self-pay | Admitting: Family Medicine

## 2020-11-22 ENCOUNTER — Ambulatory Visit (INDEPENDENT_AMBULATORY_CARE_PROVIDER_SITE_OTHER): Payer: Medicare Other | Admitting: Family Medicine

## 2020-11-22 VITALS — BP 150/74 | HR 74 | Temp 96.1°F | Ht 72.0 in | Wt 179.2 lb

## 2020-11-22 DIAGNOSIS — E1140 Type 2 diabetes mellitus with diabetic neuropathy, unspecified: Secondary | ICD-10-CM | POA: Diagnosis not present

## 2020-11-22 DIAGNOSIS — Z794 Long term (current) use of insulin: Secondary | ICD-10-CM | POA: Diagnosis not present

## 2020-11-22 DIAGNOSIS — I10 Essential (primary) hypertension: Secondary | ICD-10-CM

## 2020-11-22 LAB — GLUCOSE, POCT (MANUAL RESULT ENTRY): POC Glucose: 490 mg/dl — AB (ref 70–99)

## 2020-11-22 MED ORDER — LANTUS SOLOSTAR 100 UNIT/ML ~~LOC~~ SOPN: 30.0000 [IU] | PEN_INJECTOR | Freq: Every day | SUBCUTANEOUS | 2 refills | Status: DC

## 2020-11-22 MED ORDER — GABAPENTIN 100 MG PO CAPS: 200.0000 mg | ORAL_CAPSULE | Freq: Three times a day (TID) | ORAL | 3 refills | Status: DC

## 2020-11-22 NOTE — Telephone Encounter (Signed)
Patient in office for OV visit today will go over meds and refill needed.

## 2020-11-22 NOTE — Patient Instructions (Signed)
Increase insulin glargine (Lantus) to 30 units at bedtime.  If blood sugar still elevated above 150 in 1 week, increase the Lantus to 35 units.

## 2020-11-22 NOTE — Telephone Encounter (Signed)
Bessie from [facility] calling to request refill Last fill for Potassium 10/24/20  #60/0 Last fill for Hydralazine 10/24/20 #90/0 Last OV 11/22/20

## 2020-11-22 NOTE — Progress Notes (Signed)
[facility] [address] [place] [address] Dept: [phone] Dept Fax: [phone]  Acute Office Visit  Subjective:    Patient ID: Kenneth [name], male    DOB: 11/11/1960, 60 y.o..   MRN: [id number]  Chief Complaint  Patient presents with  . Follow-up    Follow up on diabetes per patient levels are still elevated at home. Also has concerns about Gabapentin makes patient lose his grip on things.     History of Present Illness:  Patient is in today for reassessment of his blood sugars. He was seen 2 weeks ago due to a recent change in his medication that had contributed to hyperglycemia. He was in the hospital several weeks ago for an exacerbation of heart failure. At discharge, his metformin had been stopped (presumably due to its negative impact on CHF) and he had been started on Lantus 10 units qhs for diabetes management. Since discharge he has noted his blood sugars to be running > 500. Two weeks ago, we increased his insulin to 20 units qhs. He was given instructions about titrating the dose up, but apparently, he was either uncomfortable making those changes or did not remember to do this. He is still seeing blood sugars that are reading only as "High".  Additionally, Kenneth [name] has noted occasional issues with poor grip and sudden muscles twitches. He is worried that this may be due to his gabapentin., which he is on for neuropathy. He would like to decrease his dose and see if this improves.  Past Medical History: Patient Active Problem List   Diagnosis Date Noted  . Acute on chronic respiratory failure with hypoxia ([place]) 10/23/2020  . Cardiac arrest ([place]) 10/04/2020  . Acute kidney injury superimposed on CKD ([place]) 09/23/2020  . Thiamine deficiency 07/02/2020  . Obstructive sleep apnea 05/02/2020  . Hyperlipidemia   . Acute on chronic combined systolic (congestive) and diastolic (congestive) heart failure ([place])  04/07/2020  . Chronic constipation 07/19/2019  . Shortness of breath 06/12/2019  . Snoring 03/29/2019  . Insomnia due to other mental disorder 11/15/2018  . Anticoagulant long-term use 10/21/2018  . Gastroesophageal reflux disease without esophagitis 08/10/2018  . Tobacco abuse 08/10/2018  . Chest pain 07/27/2018  . Essential hypertension 07/06/2018  . Chronic obstructive pulmonary disease ([place]) 07/06/2018  . Alcohol abuse 07/06/2018  . Paroxysmal atrial fibrillation ([place]) 11/28/2017  . Slow transit constipation 06/07/2016  . Chronic anemia 06/06/2016  . Hypertensive kidney disease with chronic kidney disease stage III ([place]) 06/06/2016  . Mixed anxiety and depressive disorder 06/05/2016  . Type 2 diabetes mellitus with insulin therapy ([place]) 06/05/2016  . Chronic obstructive pulmonary disease with (acute) exacerbation ([place]) 06/05/2016   Past Surgical History:  Procedure Laterality Date  . NO PAST SURGERIES     Family History  Problem Relation Age of Onset  . Diabetes Mother   . Heart disease Mother   . Diabetes Father   . Heart disease Father   . Diabetes Sister   . Heart disease Sister   . Kidney disease Sister   . Coronary artery disease Brother   . Liver disease Maternal Uncle    Outpatient Medications Prior to Visit  Medication Sig Dispense Refill  . acetaminophen (TYLENOL) 325 MG tablet Take 650 mg by mouth every 6 (six) hours as needed for mild pain or headache.    . albuterol (PROVENTIL) (2.5 MG/3ML) 0.083% nebulizer solution INHALE 1 VIAL VIA NEBULIZER 5 TIMES DAILY AS NEEDED FOR  WHEEZING OR FOR SHORTNESS OF BREATH (Patient taking differently: Take 2.5 mg by nebulization every 6 (six) hours as needed for shortness of breath.) 150 mL 1  . amLODipine (NORVASC) 5 MG tablet Take 5 mg by mouth every morning.    . ARIPiprazole (ABILIFY) 5 MG tablet Take 5 mg by mouth daily.    . Blood Glucose Monitoring Suppl (ONE TOUCH ULTRA 2) w/Device KIT Use to test blood sugars 1-2  times daily. 1 kit 0  . busPIRone (BUSPAR) 15 MG tablet Take 1 tablet (15 mg total) by mouth 2 (two) times daily. 180 tablet 1  . carvedilol (COREG) 12.5 MG tablet Take 1 tablet (12.5 mg total) by mouth 2 (two) times daily with a meal. 180 tablet 3  . DM-GG & DM-APAP-CPM (CORICIDIN HBP DAY/NIGHT COLD) 10-20 &15-200-2 MG MISC Take 1 tablet by mouth every 12 (twelve) hours as needed (cold symptoms).    [name] ELIQUIS 5 MG TABS tablet TAKE ONE TABLET BY MOUTH EVERY MORNING and TAKE ONE TABLET BY MOUTH EVERY EVENING 180 tablet 1  . famotidine (PEPCID) 20 MG tablet Take 20 mg by mouth daily.    [name] FLUoxetine (PROZAC) 20 MG capsule TAKE 3 CAPSULES BY MOUTH  DAILY 270 capsule 3  . Fluticasone-Umeclidin-Vilant (TRELEGY ELLIPTA) 100-62.5-25 MCG/INH AEPB Inhale 1 puff into the lungs daily. 60 each 5  . furosemide (LASIX) 40 MG tablet Take 1 tablet (40 mg total) by mouth 2 (two) times daily. 60 tablet 11  . glucose blood (ONETOUCH ULTRA) test strip USE TO TEST BLOOD SUGAR 1-2 TIMES DAILY 100 each 11  . guaiFENesin (MUCINEX) 600 MG 12 hr tablet Take 1 tablet (600 mg total) by mouth 2 (two) times daily. 30 tablet 0  . hydrALAZINE (APRESOLINE) 25 MG tablet Take 1 tablet (25 mg total) by mouth 3 (three) times daily. 90 tablet 0  . Insulin Pen Needle (PEN NEEDLES) 31G X 6 MM MISC 1 application by Does not apply route at bedtime. 100 each 1  . ipratropium-albuterol (DUONEB) 0.5-2.5 (3) MG/3ML SOLN Take 3 mLs by nebulization every 6 (six) hours as needed. (Patient taking differently: Take 3 mLs by nebulization every 6 (six) hours as needed (For shortness of breath).) 360 mL 0  . isosorbide mononitrate (IMDUR) 30 MG 24 hr tablet Take 30 mg by mouth daily.    . Lancets (ONETOUCH ULTRASOFT) lancets USE TO TEST BLOOD SUGAR 1-2 TIMES DAILY 100 each 11  . lubiprostone ([name]) 8 MCG capsule Take 1 capsule (8 mcg total) by mouth 2 (two) times daily with a meal. 60 capsule 0  . Multiple Vitamin (MULTIVITAMIN WITH MINERALS) TABS  tablet Take 1 tablet by mouth daily.    . nitroGLYCERIN (NITROSTAT) 0.4 MG SL tablet DISSOLVE 1 TABLET UNDER THE TONGUE EVERY 5 MINUTES AS&nbsp;&nbsp;NEEDED FOR CHEST PAIN. MAX&nbsp;&nbsp;OF 3 TABLETS IN 15 MINUTES. CALL 911 IF PAIN PERSISTS. (Patient taking differently: Place 0.4 mg under the tongue every 5 (five) minutes as needed for chest pain.) 75 tablet 4  . pantoprazole (PROTONIX) 40 MG tablet TAKE ONE TABLET BY MOUTH EVERY MORNING 30 tablet 4  . potassium chloride SA (KLOR-CON) 20 MEQ tablet Take 2 tablets (40 mEq total) by mouth daily. 60 tablet 0  . predniSONE (DELTASONE) 10 MG tablet Take 1 tablet (10 mg total) by mouth daily with breakfast. 30 tablet 2  . rosuvastatin (CRESTOR) 20 MG tablet TAKE ONE TABLET BY MOUTH ONCE DAILY 30 tablet 3  . spironolactone (ALDACTONE) 25 MG tablet Take 1 tablet (  25 mg total) by mouth daily. 30 tablet 0  . gabapentin (NEURONTIN) 300 MG capsule TAKE ONE CAPSULE BY MOUTH THREE TIMES DAILY (Patient taking differently: Take 300 mg by mouth 3 (three) times daily.) 90 capsule 2  . insulin glargine (LANTUS SOLOSTAR) 100 UNIT/ML Solostar Pen Inject 20 Units into the skin at bedtime. If fasting blood sugars remain >150 for 3 days, increase insulin dose by 4 units. Maximum of 80 units. 15 mL 2  . Roflumilast (DALIRESP) 250 MCG TABS Take 250 mcg by mouth daily. (Patient not taking: No sig reported) 30 tablet 11   No facility-administered medications prior to visit.   Allergies  Allergen Reactions  . Lisinopril Swelling  . Other Other (See Comments)    Lettuce : rash  . Tomato Rash    Objective:   Today's Vitals   11/22/20 0927  BP: (!) 150/74  Pulse: 74  Temp: (!) 96.1 F (35.6 C)  TempSrc: Temporal  SpO2: 98%  Weight: 179 lb 3.2 oz (81.3 kg)  Height: 6' (1.829 m)   Body mass index is 24.3 kg/m.   General: Well developed, well nourished. No acute distress. Neuro: Grip strength equal bilaterally. Psych: Alert and oriented x3. Normal mood and  affect.  Health Maintenance Due  Topic Date Due  . COLONOSCOPY (Pts 45-34yr Insurance coverage will need to be confirmed)  09/23/2019  . OPHTHALMOLOGY EXAM  05/12/2020   LABS:   Lab Results  Component Value Date   POCGLU 490 (A) 11/22/2020   Assessment & Plan:   1. Type 2 diabetes mellitus with diabetic neuropathy, with long-term current use of insulin ([facility]) Glucose remains elevated. Patient was unable to titrate his insulin up on his on. I will increase this today and ask that he titrate this in week. I will see him in 2 weeks to reassess. I recommend that he continue to push fluids. I do think it reasonable to try and reduce his gabapentin dose, so will decrease this to 200 mg TID.  - POCT Glucose (CBG) - insulin glargine (LANTUS SOLOSTAR) 100 UNIT/ML Solostar Pen; Inject 30 Units into the skin at bedtime. If fasting blood sugars remain >150 for 1 week, increase insulin dose by 5 units. Maximum of 80 units.  Dispense: 15 mL; Refill: 2 - gabapentin (NEURONTIN) 100 MG capsule; Take 2 capsules (200 mg total) by mouth 3 (three) times daily.  Dispense: 180 capsule; Refill: 3  SHaydee Salter MD

## 2020-11-22 NOTE — Progress Notes (Signed)
Chronic Care Management Pharmacy Assistant   Name: Kenneth [name]  MRN: [id number] DOB: 20-Feb-1961  Reason for Encounter: Medication Review  Patient Questions:  1.  Have you seen any other providers since your last visit? Yes, 11/14/2020 [facility], 11/22/2020 [facility]  2.  Any changes in your medicines or health? Yes, 11/22/2020 [facility] insulin glargine (LANTUS SOLOSTAR) 100 UNIT/ML Solostar Pen; Inject 30 Units into the skin at bedtime. If fasting blood sugars remain >150 for 1 week, increase insulin dose by 5 units. Maximum of 80 units. gabapentin (NEURONTIN) 100 MG capsule; Take 2 capsules (200 mg total) by mouth 3 (three) times daily.  ,    PCP : [name], MD  Allergies:   Allergies  Allergen Reactions  . Lisinopril Swelling  . Other Other (See Comments)    Lettuce : rash  . Tomato Rash    Medications: Outpatient Encounter Medications as of 11/22/2020  Medication Sig Note  . acetaminophen (TYLENOL) 325 MG tablet Take 650 mg by mouth every 6 (six) hours as needed for mild pain or headache.   . albuterol (PROVENTIL) (2.5 MG/3ML) 0.083% nebulizer solution INHALE 1 VIAL VIA NEBULIZER 5 TIMES DAILY AS NEEDED FOR WHEEZING OR FOR SHORTNESS OF BREATH (Patient taking differently: Take 2.5 mg by nebulization every 6 (six) hours as needed for shortness of breath.)   . amLODipine (NORVASC) 5 MG tablet Take 5 mg by mouth every morning.   . ARIPiprazole (ABILIFY) 5 MG tablet Take 5 mg by mouth daily. 10/29/2020: Prescribed by  Madison Hickman, NP   . Blood Glucose Monitoring Suppl (ONE TOUCH ULTRA 2) w/Device KIT Use to test blood sugars 1-2 times daily.   . busPIRone (BUSPAR) 15 MG tablet Take 1 tablet (15 mg total) by mouth 2 (two) times daily. 10/29/2020: Prescribed by  Madison Hickman, NP    . carvedilol (COREG) 12.5 MG tablet Take 1 tablet (12.5 mg total) by mouth 2 (two) times daily with a meal.   . DM-GG & DM-APAP-CPM (CORICIDIN HBP  DAY/NIGHT COLD) 10-20 &15-200-2 MG MISC Take 1 tablet by mouth every 12 (twelve) hours as needed (cold symptoms).   [name] ELIQUIS 5 MG TABS tablet TAKE ONE TABLET BY MOUTH EVERY MORNING and TAKE ONE TABLET BY MOUTH EVERY EVENING   . famotidine (PEPCID) 20 MG tablet Take 20 mg by mouth daily.   [name] FLUoxetine (PROZAC) 20 MG capsule TAKE 3 CAPSULES BY MOUTH  DAILY 10/29/2020: Prescribed by  Madison Hickman, NP   . Fluticasone-Umeclidin-Vilant (TRELEGY ELLIPTA) 100-62.5-25 MCG/INH AEPB Inhale 1 puff into the lungs daily.   . furosemide (LASIX) 40 MG tablet Take 1 tablet (40 mg total) by mouth 2 (two) times daily.   [name] gabapentin (NEURONTIN) 300 MG capsule TAKE ONE CAPSULE BY MOUTH THREE TIMES DAILY (Patient taking differently: Take 300 mg by mouth 3 (three) times daily.) 11/12/2020: Pt has questions and concerns about this RX and it possibly causing him to have "trouble holding things"  . glucose blood (ONETOUCH ULTRA) test strip USE TO TEST BLOOD SUGAR 1-2 TIMES DAILY   . guaiFENesin (MUCINEX) 600 MG 12 hr tablet Take 1 tablet (600 mg total) by mouth 2 (two) times daily.   . hydrALAZINE (APRESOLINE) 25 MG tablet Take 1 tablet (25 mg total) by mouth 3 (three) times daily.   . insulin glargine (LANTUS SOLOSTAR) 100 UNIT/ML Solostar Pen Inject 20 Units into the skin at bedtime. If fasting blood sugars remain >150 for 3 days,  increase insulin dose by 4 units. Maximum of 80 units.   . Insulin Pen Needle (PEN NEEDLES) 31G X 6 MM MISC 1 application by Does not apply route at bedtime.   [name] ipratropium-albuterol (DUONEB) 0.5-2.5 (3) MG/3ML SOLN Take 3 mLs by nebulization every 6 (six) hours as needed. (Patient taking differently: Take 3 mLs by nebulization every 6 (six) hours as needed (For shortness of breath).)   . isosorbide mononitrate (IMDUR) 30 MG 24 hr tablet Take 30 mg by mouth daily.   . Lancets (ONETOUCH ULTRASOFT) lancets USE TO TEST BLOOD SUGAR 1-2 TIMES DAILY   . lubiprostone ([name]) 8 MCG capsule Take 1  capsule (8 mcg total) by mouth 2 (two) times daily with a meal.   . Multiple Vitamin (MULTIVITAMIN WITH MINERALS) TABS tablet Take 1 tablet by mouth daily.   . nitroGLYCERIN (NITROSTAT) 0.4 MG SL tablet DISSOLVE 1 TABLET UNDER THE TONGUE EVERY 5 MINUTES AS&nbsp;&nbsp;NEEDED FOR CHEST PAIN. MAX&nbsp;&nbsp;OF 3 TABLETS IN 15 MINUTES. CALL 911 IF PAIN PERSISTS. (Patient taking differently: Place 0.4 mg under the tongue every 5 (five) minutes as needed for chest pain.) 10/04/2020: Last filled 07/25/20  . pantoprazole (PROTONIX) 40 MG tablet TAKE ONE TABLET BY MOUTH EVERY MORNING   . potassium chloride SA (KLOR-CON) 20 MEQ tablet Take 2 tablets (40 mEq total) by mouth daily.   . predniSONE (DELTASONE) 10 MG tablet Take 1 tablet (10 mg total) by mouth daily with breakfast.   . Roflumilast (DALIRESP) 250 MCG TABS Take 250 mcg by mouth daily. (Patient not taking: No sig reported)   . rosuvastatin (CRESTOR) 20 MG tablet TAKE ONE TABLET BY MOUTH ONCE DAILY   . spironolactone (ALDACTONE) 25 MG tablet Take 1 tablet (25 mg total) by mouth daily.    No facility-administered encounter medications on file as of 11/22/2020.    Current Diagnosis: Patient Active Problem List   Diagnosis Date Noted  . Acute on chronic respiratory failure with hypoxia ([place]) 10/23/2020  . Cardiac arrest ([place]) 10/04/2020  . Acute kidney injury superimposed on CKD ([place]) 09/23/2020  . Thiamine deficiency 07/02/2020  . Obstructive sleep apnea 05/02/2020  . Hyperlipidemia   . Acute on chronic combined systolic (congestive) and diastolic (congestive) heart failure ([place]) 04/07/2020  . Chronic constipation 07/19/2019  . Hospital discharge follow-up 06/17/2019  . Shortness of breath 06/12/2019  . Snoring 03/29/2019  . Insomnia due to other mental disorder 11/15/2018  . Anticoagulant long-term use 10/21/2018  . Gastroesophageal reflux disease without esophagitis 08/10/2018  . Tobacco abuse 08/10/2018  . Chest pain 07/27/2018  .  Essential hypertension 07/06/2018  . Chronic obstructive pulmonary disease ([place]) 07/06/2018  . Healthcare maintenance 07/06/2018  . Alcohol abuse 07/06/2018  . Paroxysmal atrial fibrillation ([place]) 11/28/2017  . Slow transit constipation 06/07/2016  . Chronic anemia 06/06/2016  . Hypertensive kidney disease with chronic kidney disease stage III ([place]) 06/06/2016  . Mixed anxiety and depressive disorder 06/05/2016  . Type 2 diabetes mellitus with insulin therapy ([place]) 06/05/2016  . Chronic obstructive pulmonary disease with (acute) exacerbation ([place]) 06/05/2016    Goals Addressed   None    Reviewed chart for medication changes ahead of medication coordination call.  OVs, Consults, or hospital visits since last care coordination call/Pharmacist visit.   11/14/2020 [name],11/22/2020 [name]  medication changes indicated OR   Yes, 11/22/2020 [facility] insulin glargine (LANTUS SOLOSTAR) 100 UNIT/ML Solostar Pen; Inject 30 Units into the skin at bedtime. If fasting blood sugars remain >150 for  1 week, increase insulin dose by 5 units. Maximum of 80 units. gabapentin (NEURONTIN) 100 MG capsule; Take 2 capsules (200 mg total) by mouth 3 (three) times daily.  , BP Readings from Last 3 Encounters:  11/14/20 (!) 146/74  10/29/20 (!) 143/68  10/24/20 134/88    Lab Results  Component Value Date   HGBA1C 6.3 (H) 09/13/2020     Patient obtains medications through Vials  30 Days   Last adherence delivery included: Amlodipine 5MG Tablet Daily -Breakfast Aripiprazole 5 mg daily  Buspirone 15 MG Tablet Two Times Daily - Breakfast, Evening meals Eliquis 5 MG Tablets twice daily -Breakfast, Evening Meals Fluoxetine 20 MG Capsule Three Capsules Daily Furosemide 40 Mg Tablet Twice Daily - Breakfast, Evening Meals Isosorbide Mononitrate 30 MG Tablet Daily -Breakfast Metformin HCI 500 MG Tablet Twice Daily- Breakfast, Evening Meal Pantoprazole 40 MG Tablet  Daily -Breakfast Rosuvastatin 20 mg daily            Per Note on 11/07/2020 acute delivery was order  on 11/07/2020 for Lantus 20 units at bedtime , pen needles and nitroglycerin 0.4 mg PRN.   Per note on 11/20/2020 acute delivery was order for 11/20/2020 for spironolactone 25 mg.  Patient declined medications last month:    Lubiprostone 8 MCG Capsule Twice Daily- Breakfast, Evening Meals-Adequate supply              Gabapentin 300 MG Capsule Three Times Daily- Breakfast, Lunch , Evening meals -Adequate supply              Nitroglycerin 0.4MG SL Tablet PRN-Adequate supply             Albuterol 108 MCG/ACT inhalerPRN - adequate supply Albuterol 0.83% 25CT SOLN PRN- Patient states he will get this from a company.             One Touch U-Soft Lancet 100-Adequate supply One touch Ult 100CT Test strip- Adequate Supply             Spironolactone 25 mg-deliver on 10/24/2020             Hydralazine 25 mg - deliver on 10/24/2020             Potassium 20 meq- deliver on 10/24/2020             Daliresp 250 mg - waiting for patient assistance  Patient is due for next adherence delivery on: 11/29/2020. Called patient and reviewed medications and coordinated delivery.  This delivery to include: Amlodipine 5MG Tablet Daily -Breakfast Aripiprazole 5 mg daily  Buspirone 15 MG one  Tablet Two Times Daily - Breakfast, Evening meals Eliquis 5 MG OneTablets twice daily -Breakfast, Evening Meals Fluoxetine 20 MG Capsule Three Capsules Daily Furosemide 40 Mg One Tablet Twice Daily - Breakfast, Evening Meals  Pantoprazole 40 MG One Tablet Daily -Breakfast Rosuvastatin 20 mg daily Nitroglycerin 0.4MG SL Tablet PRN Albuterol 108 MCG/ACT inhalerPRN  Lubiprostone 8 MCG One  Capsule Twice Daily- Breakfast, Evening Meals Potassium 20 meq two tablets by mouth daily Hydralazine 25 mg Take one tablet 3 times daily. insulin glargine (LANTUS SOLOSTAR) 100 UNIT/ML  Solostar Pen; Inject 30 Units into the skin at bedtime. If fasting blood sugars remain >150 for 1 week, increase insulin dose by 5 units. Maximum of 80 units  Patient will not need a short fill of medication, prior to adherence delivery.   Coordinated acute fill for medications to be delivered.  Spironolactone 25 mg-deliver on 11/23/2020  Isosorbide Mononitrate 30 MG  Tablet Daily -deliver on 11/23/2020  Patient declined the following medications: Metformin HCI 500 MG Tablet Twice Daily- (patient states he not taking per instruction from provider) Gabapentin 100 mg capsule-take 2 capsule by mouth 3 times daily(adequate supply) Albuterol 0.83% 25CT SOLN PRN- Patient states he will get this from a company. One Touch U-Soft Lancet 100-Adequate supply One touch Ult 100CT Test strip- Adequate Supply   Patient states he is having difficulties shallowing potassium due to it size. Patient is asking if there is a different tablet, he can take that is potassium.  Patient needs refills for Hydralazine 25 mg, Potassium 20 meq , reach out to PCP to request refills on 11/22/2020.  Confirmed delivery date of 11/29/2020, advised patient that pharmacy will contact them the morning of delivery.  Blood pressure readings: Patient states his blood pressure been ranging around 130's-145/80's.  Patient is asking the status of his PAP for Dailresp[employer] Clinical Pharmacist.  Follow-Up:  Pharmacist Review   [place] Clinical Pharmacist Assistant [phone]

## 2020-11-23 DIAGNOSIS — E109 Type 1 diabetes mellitus without complications: Secondary | ICD-10-CM | POA: Diagnosis not present

## 2020-11-23 DIAGNOSIS — Z794 Long term (current) use of insulin: Secondary | ICD-10-CM | POA: Diagnosis not present

## 2020-11-23 MED ORDER — HYDRALAZINE HCL 25 MG PO TABS: 25.0000 mg | ORAL_TABLET | Freq: Three times a day (TID) | ORAL | 5 refills | Status: DC

## 2020-11-23 MED ORDER — POTASSIUM CHLORIDE CRYS ER 20 MEQ PO TBCR: 40.0000 meq | EXTENDED_RELEASE_TABLET | Freq: Every day | ORAL | 5 refills | Status: DC

## 2020-11-25 ENCOUNTER — Encounter: Payer: Self-pay | Admitting: Family Medicine

## 2020-11-26 NOTE — Telephone Encounter (Signed)
Called patient on the phone and advised of what his BS was on his last appt.  He reports still having elevated BS in AM 360-577. He will start taking 35 units of Lantus at night.  No further questions. Dm/cma

## 2020-11-27 ENCOUNTER — Telehealth: Payer: Self-pay

## 2020-11-27 DIAGNOSIS — I5042 Chronic combined systolic (congestive) and diastolic (congestive) heart failure: Secondary | ICD-10-CM | POA: Diagnosis not present

## 2020-11-27 DIAGNOSIS — I10 Essential (primary) hypertension: Secondary | ICD-10-CM | POA: Diagnosis not present

## 2020-11-27 DIAGNOSIS — M6281 Muscle weakness (generalized): Secondary | ICD-10-CM | POA: Diagnosis not present

## 2020-11-27 DIAGNOSIS — I480 Paroxysmal atrial fibrillation: Secondary | ICD-10-CM | POA: Diagnosis not present

## 2020-11-27 DIAGNOSIS — I219 Acute myocardial infarction, unspecified: Secondary | ICD-10-CM | POA: Diagnosis not present

## 2020-11-27 DIAGNOSIS — E119 Type 2 diabetes mellitus without complications: Secondary | ICD-10-CM | POA: Diagnosis not present

## 2020-11-27 DIAGNOSIS — J441 Chronic obstructive pulmonary disease with (acute) exacerbation: Secondary | ICD-10-CM | POA: Diagnosis not present

## 2020-11-27 DIAGNOSIS — R269 Unspecified abnormalities of gait and mobility: Secondary | ICD-10-CM | POA: Diagnosis not present

## 2020-11-27 NOTE — Chronic Care Management (AMB) (Signed)
Chronic Care Management Pharmacy Assistant   Name: Kenneth [name]  MRN: [id number] DOB: 1960/12/22  Reason for Encounter: Medication Review - Patient assistance update status on Daliresp.    Recent office visits:  11/22/2020 PCP Office [name] - Increase insulin glargine (Lantus) to 30 units at bedtime. If blood sugar still elevated above 150 in 1 week, increase the Lantus to 35 units.  Recent consult visits:  No recent Consult VIsit  Hospital visits:  None in previous 6 months  Medications: Outpatient Encounter Medications as of 11/27/2020  Medication Sig Note   acetaminophen (TYLENOL) 325 MG tablet Take 650 mg by mouth every 6 (six) hours as needed for mild pain or headache.    albuterol (PROVENTIL) (2.5 MG/3ML) 0.083% nebulizer solution INHALE 1 VIAL VIA NEBULIZER 5 TIMES DAILY AS NEEDED FOR WHEEZING OR FOR SHORTNESS OF BREATH (Patient taking differently: Take 2.5 mg by nebulization every 6 (six) hours as needed for shortness of breath.)    amLODipine (NORVASC) 5 MG tablet Take 5 mg by mouth every morning.    ARIPiprazole (ABILIFY) 5 MG tablet Take 5 mg by mouth daily. 10/29/2020: Prescribed by  Madison Hickman, NP    Blood Glucose Monitoring Suppl (ONE TOUCH ULTRA 2) w/Device KIT Use to test blood sugars 1-2 times daily.    busPIRone (BUSPAR) 15 MG tablet Take 1 tablet (15 mg total) by mouth 2 (two) times daily. 10/29/2020: Prescribed by  Madison Hickman, NP     carvedilol (COREG) 12.5 MG tablet Take 1 tablet (12.5 mg total) by mouth 2 (two) times daily with a meal.    DM-GG & DM-APAP-CPM (CORICIDIN HBP DAY/NIGHT COLD) 10-20 &15-200-2 MG MISC Take 1 tablet by mouth every 12 (twelve) hours as needed (cold symptoms).    ELIQUIS 5 MG TABS tablet TAKE ONE TABLET BY MOUTH EVERY MORNING and TAKE ONE TABLET BY MOUTH EVERY EVENING    famotidine (PEPCID) 20 MG tablet Take 20 mg by mouth daily.    FLUoxetine (PROZAC) 20 MG capsule TAKE 3 CAPSULES BY MOUTH  DAILY 10/29/2020:  Prescribed by  Madison Hickman, NP    Fluticasone-Umeclidin-Vilant (TRELEGY ELLIPTA) 100-62.5-25 MCG/INH AEPB Inhale 1 puff into the lungs daily.    furosemide (LASIX) 40 MG tablet Take 1 tablet (40 mg total) by mouth 2 (two) times daily.    gabapentin (NEURONTIN) 100 MG capsule Take 2 capsules (200 mg total) by mouth 3 (three) times daily.    glucose blood (ONETOUCH ULTRA) test strip USE TO TEST BLOOD SUGAR 1-2 TIMES DAILY    guaiFENesin (MUCINEX) 600 MG 12 hr tablet Take 1 tablet (600 mg total) by mouth 2 (two) times daily.    hydrALAZINE (APRESOLINE) 25 MG tablet Take 1 tablet (25 mg total) by mouth 3 (three) times daily.    insulin glargine (LANTUS SOLOSTAR) 100 UNIT/ML Solostar Pen Inject 30 Units into the skin at bedtime. If fasting blood sugars remain >150 for 1 week, increase insulin dose by 5 units. Maximum of 80 units.    Insulin Pen Needle (PEN NEEDLES) 31G X 6 MM MISC 1 application by Does not apply route at bedtime.    ipratropium-albuterol (DUONEB) 0.5-2.5 (3) MG/3ML SOLN Take 3 mLs by nebulization every 6 (six) hours as needed. (Patient taking differently: Take 3 mLs by nebulization every 6 (six) hours as needed (For shortness of breath).)    isosorbide mononitrate (IMDUR) 30 MG 24 hr tablet Take 30 mg by mouth daily.    Lancets (ONETOUCH ULTRASOFT) lancets  USE TO TEST BLOOD SUGAR 1-2 TIMES DAILY    lubiprostone ([name]) 8 MCG capsule Take 1 capsule (8 mcg total) by mouth 2 (two) times daily with a meal.    Multiple Vitamin (MULTIVITAMIN WITH MINERALS) TABS tablet Take 1 tablet by mouth daily.    nitroGLYCERIN (NITROSTAT) 0.4 MG SL tablet DISSOLVE 1 TABLET UNDER THE TONGUE EVERY 5 MINUTES AS&nbsp;&nbsp;NEEDED FOR CHEST PAIN. MAX&nbsp;&nbsp;OF 3 TABLETS IN 15 MINUTES. CALL 911 IF PAIN PERSISTS. (Patient taking differently: Place 0.4 mg under the tongue every 5 (five) minutes as needed for chest pain.) 10/04/2020: Last filled 07/25/20   pantoprazole (PROTONIX) 40 MG tablet  TAKE ONE TABLET BY MOUTH EVERY MORNING    potassium chloride SA (KLOR-CON) 20 MEQ tablet Take 2 tablets (40 mEq total) by mouth daily.    predniSONE (DELTASONE) 10 MG tablet Take 1 tablet (10 mg total) by mouth daily with breakfast.    Roflumilast (DALIRESP) 250 MCG TABS Take 250 mcg by mouth daily. (Patient not taking: No sig reported)    rosuvastatin (CRESTOR) 20 MG tablet TAKE ONE TABLET BY MOUTH ONCE DAILY    spironolactone (ALDACTONE) 25 MG tablet Take 1 tablet (25 mg total) by mouth daily.    No facility-administered encounter medications on file as of 11/27/2020.      Star Rating Drugs: Rosuvastatin 20 mg   Patient assistance for Daliresp, Per [employer] , patient sign his birth day on the line that was meant for the date he actually sign the patient assistance form which was 11/06/2020.   Call patient in a three way call for patient to give verbal consent  And permission to process his application for patient assistance.Per [employer] she will now process the application and it can take 7 to 10 days for the process.  Patient Verbalized understanding.  [place] Pharmacist Assistant [phone]   I was on hold with [employer] for a total of 56 minutes

## 2020-11-28 ENCOUNTER — Telehealth: Payer: Self-pay

## 2020-11-28 ENCOUNTER — Ambulatory Visit (INDEPENDENT_AMBULATORY_CARE_PROVIDER_SITE_OTHER): Payer: Medicare Other

## 2020-11-28 ENCOUNTER — Telehealth: Payer: Self-pay | Admitting: Family Medicine

## 2020-11-28 DIAGNOSIS — Z794 Long term (current) use of insulin: Secondary | ICD-10-CM | POA: Diagnosis not present

## 2020-11-28 DIAGNOSIS — F418 Other specified anxiety disorders: Secondary | ICD-10-CM

## 2020-11-28 DIAGNOSIS — E1140 Type 2 diabetes mellitus with diabetic neuropathy, unspecified: Secondary | ICD-10-CM | POA: Diagnosis not present

## 2020-11-28 DIAGNOSIS — J441 Chronic obstructive pulmonary disease with (acute) exacerbation: Secondary | ICD-10-CM | POA: Diagnosis not present

## 2020-11-28 DIAGNOSIS — I152 Hypertension secondary to endocrine disorders: Secondary | ICD-10-CM

## 2020-11-28 DIAGNOSIS — E119 Type 2 diabetes mellitus without complications: Secondary | ICD-10-CM | POA: Diagnosis not present

## 2020-11-28 DIAGNOSIS — E1159 Type 2 diabetes mellitus with other circulatory complications: Secondary | ICD-10-CM

## 2020-11-28 DIAGNOSIS — I5043 Acute on chronic combined systolic (congestive) and diastolic (congestive) heart failure: Secondary | ICD-10-CM

## 2020-11-28 DIAGNOSIS — J449 Chronic obstructive pulmonary disease, unspecified: Secondary | ICD-10-CM

## 2020-11-28 DIAGNOSIS — I10 Essential (primary) hypertension: Secondary | ICD-10-CM | POA: Diagnosis not present

## 2020-11-28 MED ORDER — LANTUS SOLOSTAR 100 UNIT/ML ~~LOC~~ SOPN
35.0000 [IU] | PEN_INJECTOR | Freq: Every day | SUBCUTANEOUS | 2 refills | Status: DC
Start: 2020-11-28 — End: 2020-12-05

## 2020-11-28 NOTE — Telephone Encounter (Signed)
Pharmacy notified VIA phone that RX was sent.  Dm/cma

## 2020-11-28 NOTE — Progress Notes (Signed)
Reached out to PCP on 11/28/2020 to request a new rx for patient  lantus that reflects the new dose change.  Everlean Cherry Clinical Pharmacist Assistant [phone]

## 2020-11-28 NOTE — Chronic Care Management (AMB) (Signed)
Chronic Care Management   CCM RN Visit Note  11/29/2020 Name: Kenneth [name] MRN: [id number] DOB: 03-18-1961  Subjective: Kenneth [name] is a 60 y.o. year old male who is a primary care patient of [name], MD. The care management team was consulted for assistance with disease management and care coordination needs.    Engaged with patient by telephone for follow up visit in response to provider referral for case management and/or care coordination services.   Consent to Services:  The patient was given information about Chronic Care Management services, agreed to services, and gave verbal consent prior to initiation of services.  Please see initial visit note for detailed documentation.   Patient agreed to services and verbal consent obtained.   Assessment: Review of patient past medical history, allergies, medications, health status, including review of consultants reports, laboratory and other test data, was performed as part of comprehensive evaluation and provision of chronic care management services.   SDOH (Social Determinants of Health) assessments and interventions performed:  SDOH Interventions   Flowsheet Row Most Recent Value  SDOH Interventions   Financial Strain Interventions Intervention Not Indicated  [patient is working with pharmacist at primary care provider office for medication assistance.]  Intimate Partner Violence Interventions Intervention Not Indicated  Physical Activity Interventions --  [Discussed with patient incorporating exercise into his daily routine.]  Stress Interventions Intervention Not Indicated  [patient sees a therapist and has a scheduled appointment with the Licensed clinical social worker.]       CCM Care Plan  Allergies  Allergen Reactions  . Lisinopril Swelling  . Other Other (See Comments)    Lettuce : rash  . Tomato Rash    Outpatient Encounter Medications as of 11/28/2020  Medication Sig Note  . acetaminophen (TYLENOL)  325 MG tablet Take 650 mg by mouth every 6 (six) hours as needed for mild pain or headache.   . albuterol (PROVENTIL) (2.5 MG/3ML) 0.083% nebulizer solution INHALE 1 VIAL VIA NEBULIZER 5 TIMES DAILY AS NEEDED FOR WHEEZING OR FOR SHORTNESS OF BREATH (Patient taking differently: Take 2.5 mg by nebulization every 6 (six) hours as needed for shortness of breath.)   . amLODipine (NORVASC) 5 MG tablet Take 5 mg by mouth every morning.   . ARIPiprazole (ABILIFY) 5 MG tablet Take 5 mg by mouth daily. 10/29/2020: Prescribed by  Madison Hickman, NP   . Blood Glucose Monitoring Suppl (ONE TOUCH ULTRA 2) w/Device KIT Use to test blood sugars 1-2 times daily.   . busPIRone (BUSPAR) 15 MG tablet Take 1 tablet (15 mg total) by mouth 2 (two) times daily. 10/29/2020: Prescribed by  Madison Hickman, NP    . carvedilol (COREG) 12.5 MG tablet Take 1 tablet (12.5 mg total) by mouth 2 (two) times daily with a meal.   . DM-GG & DM-APAP-CPM (CORICIDIN HBP DAY/NIGHT COLD) 10-20 &15-200-2 MG MISC Take 1 tablet by mouth every 12 (twelve) hours as needed (cold symptoms).   [name] ELIQUIS 5 MG TABS tablet TAKE ONE TABLET BY MOUTH EVERY MORNING and TAKE ONE TABLET BY MOUTH EVERY EVENING   . famotidine (PEPCID) 20 MG tablet Take 20 mg by mouth daily.   [name] FLUoxetine (PROZAC) 20 MG capsule TAKE 3 CAPSULES BY MOUTH  DAILY 10/29/2020: Prescribed by  Madison Hickman, NP   . Fluticasone-Umeclidin-Vilant (TRELEGY ELLIPTA) 100-62.5-25 MCG/INH AEPB Inhale 1 puff into the lungs daily.   . furosemide (LASIX) 40 MG tablet Take 1 tablet (40 mg total) by mouth 2 (two) times daily.   [name]  gabapentin (NEURONTIN) 100 MG capsule Take 2 capsules (200 mg total) by mouth 3 (three) times daily.   [name] glucose blood (ONETOUCH ULTRA) test strip USE TO TEST BLOOD SUGAR 1-2 TIMES DAILY   . guaiFENesin (MUCINEX) 600 MG 12 hr tablet Take 1 tablet (600 mg total) by mouth 2 (two) times daily.   . hydrALAZINE (APRESOLINE) 25 MG tablet Take 1 tablet (25 mg total) by mouth 3  (three) times daily.   . insulin glargine (LANTUS SOLOSTAR) 100 UNIT/ML Solostar Pen Inject 35 Units into the skin at bedtime. If fasting blood sugars remain >150 for 1 week, increase insulin dose by 5 units. Maximum of 80 units.   . Insulin Pen Needle (PEN NEEDLES) 31G X 6 MM MISC 1 application by Does not apply route at bedtime.   [name] ipratropium-albuterol (DUONEB) 0.5-2.5 (3) MG/3ML SOLN Take 3 mLs by nebulization every 6 (six) hours as needed. (Patient taking differently: Take 3 mLs by nebulization every 6 (six) hours as needed (For shortness of breath).)   . isosorbide mononitrate (IMDUR) 30 MG 24 hr tablet Take 30 mg by mouth daily.   . Lancets (ONETOUCH ULTRASOFT) lancets USE TO TEST BLOOD SUGAR 1-2 TIMES DAILY   . lubiprostone ([name]) 8 MCG capsule Take 1 capsule (8 mcg total) by mouth 2 (two) times daily with a meal.   . Multiple Vitamin (MULTIVITAMIN WITH MINERALS) TABS tablet Take 1 tablet by mouth daily.   . nitroGLYCERIN (NITROSTAT) 0.4 MG SL tablet DISSOLVE 1 TABLET UNDER THE TONGUE EVERY 5 MINUTES AS  NEEDED FOR CHEST PAIN. MAX  OF 3 TABLETS IN 15 MINUTES. CALL 911 IF PAIN PERSISTS. (Patient taking differently: Place 0.4 mg under the tongue every 5 (five) minutes as needed for chest pain.) 10/04/2020: Last filled 07/25/20  . pantoprazole (PROTONIX) 40 MG tablet TAKE ONE TABLET BY MOUTH EVERY MORNING   . potassium chloride SA (KLOR-CON) 20 MEQ tablet Take 2 tablets (40 mEq total) by mouth daily.   . predniSONE (DELTASONE) 10 MG tablet Take 1 tablet (10 mg total) by mouth daily with breakfast.   . Roflumilast (DALIRESP) 250 MCG TABS Take 250 mcg by mouth daily. (Patient not taking: No sig reported)   . rosuvastatin (CRESTOR) 20 MG tablet TAKE ONE TABLET BY MOUTH ONCE DAILY   . spironolactone (ALDACTONE) 25 MG tablet Take 1 tablet (25 mg total) by mouth daily.    No facility-administered encounter medications on file as of 11/28/2020.    Patient Active Problem List   Diagnosis Date Noted   . Acute on chronic respiratory failure with hypoxia ([place]) 10/23/2020  . Cardiac arrest ([place]) 10/04/2020  . Acute kidney injury superimposed on CKD ([place]) 09/23/2020  . Thiamine deficiency 07/02/2020  . Obstructive sleep apnea 05/02/2020  . Hyperlipidemia   . Acute on chronic combined systolic (congestive) and diastolic (congestive) heart failure ([place]) 04/07/2020  . Chronic constipation 07/19/2019  . Shortness of breath 06/12/2019  . Snoring 03/29/2019  . Insomnia due to other mental disorder 11/15/2018  . Anticoagulant long-term use 10/21/2018  . Gastroesophageal reflux disease without esophagitis 08/10/2018  . Tobacco abuse 08/10/2018  . Chest pain 07/27/2018  . Essential hypertension 07/06/2018  . Chronic obstructive pulmonary disease ([place]) 07/06/2018  . Alcohol abuse 07/06/2018  . Paroxysmal atrial fibrillation ([place]) 11/28/2017  . Slow transit constipation 06/07/2016  . Chronic anemia 06/06/2016  . Hypertensive kidney disease with chronic kidney disease stage III ([place]) 06/06/2016  . Mixed anxiety and depressive disorder 06/05/2016  . Type 2  diabetes mellitus with insulin therapy ([place]) 06/05/2016  . Chronic obstructive pulmonary disease with (acute) exacerbation ([facility]) 06/05/2016    Conditions to be addressed/monitored:CHF, COPD and DMII  Goals Addressed   Patient Care Plan: COPD (Adult)  Problem Identified: Symptom Exacerbation (COPD)   Priority: High  Long-Range Goal: Symptom Exacerbation Prevented or Minimized   Start Date: 11/07/2020  Expected End Date: 02/18/2021  This Visit's Progress: On track  Recent Progress: On track  Priority: High  Note:   Current Barriers:  [name] Knowledge deficits related to basic COPD disease process and self care/management:  Patient reports, "I'm doing really good with my breathing."  Patient states he continues to wear his oxygen around the clock at 3 L.  He reports today's oxygen saturation was 98%.  Patient reports there was an error on his  medication assistance request form for the Daliresp medication.  He states he has since corrected the information and resubmitted form.  Patient reports primary care provider office pharmacist has been assisting him with this.  Case Manager Clinical Goal(s):   patient will be able to verbalize understanding of COPD action plan and when to seek appropriate levels of medical care: Ongoing review of COPD action plan with patient.    patient will verbalize basic understanding of COPD disease process and self care activities  Interventions:  . Collaboration with [name], MD regarding development and update of comprehensive plan of care as evidenced by provider attestation and co-signature . Inter-disciplinary care team collaboration (see longitudinal plan of care)  Provided patient with basic written and verbal COPD education on self care/management/and exacerbation prevention   Provided patient with COPD action plan and reinforced importance of daily self assessment  - communicable disease prevention promoted  - medication-adherence assessment completed.   - quality of sleep assessed  - self-awareness of symptom triggers encouraged Patient Goals/Self-Care Activities:  Follow up date: 12/05/2020 - Call your pulmonologist office, Dr. Cyril Loosen, NP and schedule follow up appointment as soon as possible.  - eliminate smoking in my home - identify and remove indoor air pollutants - limit outdoor activity during cold weather - listen for public air quality announcements frequently.  -  Continue to monitor for signs of respiratory infection, including changes in sputum color, volume and thickness, as well as fever. Report these changes to your doctor as soon as possible.  -Take your medications as prescribed. Continue to refill you medication on a timely basis so that you do not miss doses.  - Keep follow up appointments with your doctors - Contact your provider office  pharmacist for updates regarding your medication assistance request for Roflumilast.  - Continue to keep yourself familiar with your  COPD action plan  -Continue to use your oxygen as recommended by your provider and monitor oxygen saturation levels with pulse oximetry . Follow Up Plan: The patient has been provided with contact information for the care management team and has been advised to call with any health related questions or concerns.  The care management team will reach out to the patient again over the next 30 days.     Problem Identified: Coping Skills (General Plan of Care)   Priority: High  Long-Range Goal: Coping Skills Enhanced   Start Date: 11/07/2020  Expected End Date: 02/18/2021  Recent Progress: On track  Priority: High  Current Barriers:   Knowledge Deficits related to limited coping strategies for managing complex medical conditions: Patient states he has ongoing anxiety about his health concerning  his blood sugars. He reports he has a follow up appointment with his doctor on 12/05/2020 for his blood sugars. Patient reports he has ongoing follow up with a therapist.  Per chart review, patient has a scheduled appointment with [facility] care management social worker on 12/03/20.   Clinical Goal(s):  [name] Collaboration with [name], MD regarding development and update of comprehensive plan of care as evidenced by provider attestation and co-signature . Inter-disciplinary care team collaboration (see longitudinal plan of care)  Patient will work with care management team to establish personal coping strategies.     Interventions:   Evaluation of current treatment plan related to anxiety/ depression and patient's adherence to plan as established by provider.  Collaboration with [name], MD regarding development and update of comprehensive plan of care as evidenced by provider attestation       and co-signature  Inter-disciplinary care team  collaboration (see longitudinal plan of care)  Discussed plans with patient for ongoing care management follow up and provided patient with direct contact information for care management team  Provided opportunity for patient to express concerns, fears, feelings and expectations regarding his medical conditions/ outcomes.   RN case manager to notify primary care provider office of patients concerns regarding his ongoing elevated blood sugars.  RN case manager contacted patients primary care provider office and left message with Levada Dy for Dr. Gena Fray / [facility] CMA regarding ongoing elevated blood sugars and patients expressed concerns. Requested return call from [place], [place]. Patient Goals/Self Care Activities:  Patient will:  . call provider office for new concerns or questions . work with Chief Strategy Officer to address care coordination needs and will continue to work with counselor to address mental health needs   .  laugh; watch a funny movie or comedian .  practice relaxation or meditation daily .  talk about feelings with a friend, family or spiritual advisor .  practice positive thinking and self-talk  Follow Up Plan: The patient has been provided with contact information for the care management team and has been advised to call with any health related questions or concerns.  The care management team will reach out to the patient again over the next 30 days.      Patient Care Plan: Heart Failure (Adult)  Problem Identified: Disease Progression (Heart Failure)   Priority: High  Onset Date: 11/07/2020  Long-Range Goal: Patient will verbalize understanding of heart failure self management strategies   Start Date: 11/07/2020  Expected End Date: 02/18/2021  Recent Progress: On track  Priority: High  Current Barriers:  [name] Knowledge deficit related to basic heart failure pathophysiology and self care management:  Patient denies swelling in fee/ ankles. He reports he continues to use his oxygen at 3L  around the clock. Patient reports today's  O2 saturation was 98.  He reports he weighs daily and states  today's weight at 183 lbs. Patient reports his weight will fluctuate between 5-7 lbs weekly. Case Manager Clinical Goal(s):  . patient will verbalize understanding of Heart Failure Action Plan and when to call doctor . patient will take all Heart Failure medications as prescribed . patient will weigh daily and record (notifying MD of 3 lb weight gain over night or 5 lb in a week) . Patient will monitor / decrease sodium intake . Patient will contact the RN case manager if he has difficulty obtaining his medications.  . Patient will attend scheduled provider visits.  Interventions:  . Collaboration with Abelino Derrick  Delrae Alfred, MD regarding development and update of comprehensive plan of care as evidenced by provider attestation and co-signature . Inter-disciplinary care team collaboration (see longitudinal plan of care) . Basic overview and discussion of pathophysiology of Heart Failure reviewed  . Provided verbal and written education on low sodium diet . Reinforced with patient need to follow heart Failure Action Plan and report increase in weight: 3 lbs overnight or 5 lbs in a week, to his doctor.  . Discussed importance of daily weight and advised patient to weigh and record daily . Reviewed role of diuretics in prevention of fluid overload and management of heart failure . Patient will follow up with his doctor as recommended:  Per chart review, patient has a follow up appointment scheduled with his cardiologist on 12/24/2020.  Patient Goals/Self-Care Activities - call office if I gain more than 3 pounds in one day or 5 pounds in one week - continue to follow a low salt diet.  - /continue to watch for swelling in feet, ankles and legs every day. Report increase in symptoms to your doctor.  - continue to weight yourself daily and record weights. Follow Up Plan: The patient has been provided  with contact information for the care management team and has been advised to call with any health related questions or concerns.  The care management team will reach out to the patient again over the next 2 weeks.       Patient Care Plan: Diabetes Type 2 (Adult)  Problem Identified: Glycemic Management (Diabetes, Type 2)   Priority: High  Long-Range Goal: Glycemic Management Optimized   Start Date: 11/28/2020  Expected End Date: 03/21/2021  This Visit's Progress: On track  Priority: High  Objective:  Lab Results  Component Value Date   HGBA1C 6.3 (H) 09/13/2020 .   Lab Results  Component Value Date   CREATININE 1.51 (H) 10/30/2020   CREATININE 1.25 (H) 10/24/2020   CREATININE 1.84 (H) 10/23/2020   Current Barriers:  [name] Knowledge Deficits related to basic Diabetes pathophysiology and self care/management Patient reports his blood sugars having been ranging from 310 to Hi on his meter over the past 2 weeks. He reports having follow up with his provider on 11/22/2020 and having his insulin adjusted. Patient states because he was concerned about his blood sugars being elevated he bought a new glucose meter to use. Patient states he continues to have high blood sugars even with the new meter. Patient states he is concerned he does not have insulin coverage throughout the day when he eats. He reports he is only taking his evening lantas.   Case Manager Clinical Goal(s):  . patient will demonstrate improved adherence to prescribed treatment plan for diabetes self care/management as evidenced by: daily monitoring and recording of CBG  adherence to ADA/ carb modified diet adherence to prescribed medication regimen contacting provider for new or worsened symptoms or questions Interventions:  . Collaboration with [name], MD regarding development and update of comprehensive plan of care as evidenced by provider attestation and co-signature . Inter-disciplinary care team collaboration  (see longitudinal plan of care):  RN case manager contacted patients primary care provider office and left message with Levada Dy for Dr. Gena Fray / [facility] CMA regarding ongoing elevated blood sugars and patients expressed concerns. Requested return call from [place], [place]. . Reviewed medications with patient and discussed importance of medication adherence: Patient reports he is taking Lantas 35U at bedtime as prescribed.  . Discussed plans with patient for ongoing care  management follow up and provided patient with direct contact information for care management team . Provided patient with written educational materials related hyperglycemia and importance of correct treatment . Reviewed scheduled/upcoming provider appointments including: Patient confirmed his next follow up visit with his primary care provider in 12/05/2020 Self-Care Activities Self administers oral medications as prescribed Self administers insulin as prescribed Attends all scheduled provider appointments Checks blood sugars as prescribed and utilize hyper and hypoglycemia protocol as needed Adheres to prescribed ADA/carb modified Patient Goals: - Continue to check your blood sugar as advised by your doctor  - Continue to take your diabetic medications as prescribed.  - check your blood sugar if you feel it is too high or too low - Continue to enter your blood sugar readings and medication or insulin into daily log - Always take your blood sugar log or meter to your doctor visits  - Plan to eat low carbohydrate and low salt meals, watch your portion sizes and avoid sugar sweetened drinks.  Follow Up Plan: The patient has been provided with contact information for the care management team and has been advised to call with any health related questions or concerns.  The care management team will reach out to the patient again over the next 2 weeks.         Plan:The patient has been provided with contact information for the care  management team and has been advised to call with any health related questions or concerns.  and The care management team will reach out to the patient again over the next 2 weeks.  Quinn Plowman RN,BSN,CCM RN Case Manager [place] [phone]

## 2020-11-28 NOTE — Telephone Encounter (Signed)
[facility] calling and said they need new script for the updated way that pt is taking Lantus. Pharmacy call back, [phone] Pt will be out soon, They said it was changed by Dr Veto Kemps

## 2020-11-29 ENCOUNTER — Telehealth: Payer: Self-pay

## 2020-11-29 DIAGNOSIS — I480 Paroxysmal atrial fibrillation: Secondary | ICD-10-CM | POA: Diagnosis not present

## 2020-11-29 DIAGNOSIS — E119 Type 2 diabetes mellitus without complications: Secondary | ICD-10-CM | POA: Diagnosis not present

## 2020-11-29 DIAGNOSIS — I219 Acute myocardial infarction, unspecified: Secondary | ICD-10-CM | POA: Diagnosis not present

## 2020-11-29 DIAGNOSIS — J441 Chronic obstructive pulmonary disease with (acute) exacerbation: Secondary | ICD-10-CM | POA: Diagnosis not present

## 2020-11-29 DIAGNOSIS — R269 Unspecified abnormalities of gait and mobility: Secondary | ICD-10-CM | POA: Diagnosis not present

## 2020-11-29 DIAGNOSIS — I5042 Chronic combined systolic (congestive) and diastolic (congestive) heart failure: Secondary | ICD-10-CM | POA: Diagnosis not present

## 2020-11-29 DIAGNOSIS — I10 Essential (primary) hypertension: Secondary | ICD-10-CM | POA: Diagnosis not present

## 2020-11-29 DIAGNOSIS — M6281 Muscle weakness (generalized): Secondary | ICD-10-CM | POA: Diagnosis not present

## 2020-11-29 NOTE — Patient Instructions (Signed)
Visit Information  PATIENT GOALS: Goals Addressed            This Visit's Progress   . Monitor and Manage My Blood Sugar-Diabetes Type 2   On track    Timeframe:  Long-Range Goal Priority:  High Start Date:   11/28/2020                        Expected End Date:  03/21/2021                     Follow Up Date 12/05/2020    - Continue to check your blood sugar as advised by your doctor  - Continue to take your diabetic medications as prescribed.  - check your blood sugar if you feel it is too high or too low - Continue to enter your blood sugar readings and medication or insulin into daily log - Always take your blood sugar log or meter to your doctor visits  - Plan to eat low carbohydrate and low salt meals, watch your portion sizes and avoid sugar sweetened drinks.    Why is this important?    Checking your blood sugar at home helps to keep it from getting very high or very low.   Writing the results in a diary or log helps the doctor know how to care for you.   Your blood sugar log should have the time, date and the results.   Also, write down the amount of insulin or other medicine that you take.   Other information, like what you ate, exercise done and how you were feeling, will also be helpful.     Notes:     . Patient will verbalize decrease in anxiety   On track    Timeframe:  Long-Range Goal Priority:  High Start Date:   11/07/2020                          Expected End Date:  02/18/2021                     Follow Up Date 12/05/2020    - call and visit an old friend - laugh; watch a funny movie or comedian - practice relaxation or meditation daily - talk about feelings with a friend, family or spiritual advisor - practice positive thinking and self-talk    Why is this important?    When you are stressed, down or upset, your body reacts too.   For example, your blood pressure may get higher; you may have a headache or stomachache.   When your emotions get the  best of you, your body's ability to fight off cold and flu gets weak.   These steps will help you manage your emotions.     Notes:     . Track and Manage Fluids and Swelling-Heart Failure   On track    Timeframe:  Long-Range Goal Priority:  High Start Date:     11/07/2020                       Expected End Date:    02/18/2021                   Follow Up Date  12/05/2020  - call office if I gain more than 3 pounds in one day or 5 pounds in one week - continue to follow  a low salt diet.  - /continue to watch for swelling in feet, ankles and legs every day. Report increase in symptoms to your doctor.  - continue to weight yourself daily and record weights.    Why is this important?    It is important to check your weight daily and watch how much salt and liquids you have.   It will help you to manage your heart failure.    Notes:     . Track and manage my COPD symptoms.   On track    Timeframe:  Long-Range Goal Priority:  High Start Date:       11/07/2020                    Expected End Date:    02/18/2021              Follow up date: 12/05/2020 - Call your pulmonologist office, Dr. Cherylann Banas, NP and schedule follow up appointment as soon as possible.  - eliminate smoking in my home - identify and remove indoor air pollutants - limit outdoor activity during cold weather - listen for public air quality announcements frequently.  -  Continue to monitor for signs of respiratory infection, including changes in sputum color, volume and thickness, as well as fever. Report these changes to your doctor as soon as possible.  -Take your medications as prescribed. Continue to refill you medication on a timely basis so that you do not miss doses.  - Keep follow up appointments with your doctors - Contact your provider office pharmacist for updates regarding your medication assistance request for Roflumilast.  - Continue to keep yourself familiar with your  COPD action plan   -Continue to use your oxygen as recommended by your provider and monitor oxygen saturation levels with pulse oximetry .  Why is this important?   Triggers are activities or things, like tobacco smoke or cold weather, that make your COPD (chronic obstructive pulmonary disease) flare-up.  Knowing these triggers helps you plan how to stay away from them.  When you cannot remove them, you can learn how to manage them.     Notes:          Hyperglycemia Hyperglycemia is when the sugar (glucose) level in your blood is too high. High blood sugar can happen to people who have or do not have diabetes. High blood sugar can happen quickly. It can be an emergency. What are the causes? If you have diabetes, high blood sugar may be caused by:  Medicines that increase blood sugar or affect your control of diabetes.  Getting less physical activity.  Overeating.  Being sick or injured or having an infection.  Having surgery.  Stress.  Not giving yourself enough insulin (if you are taking it). You may have high blood sugar because you have diabetes that has not been diagnosed yet. If you do not have diabetes, high blood sugar may be caused by:  Certain medicines.  Stress.  A bad illness.  An infection.  Having surgery.  Diseases of the pancreas. What increases the risk? This condition is more likely to develop in people who have risk factors for diabetes, such as:  Having a family member with diabetes.  Certain conditions in which the body's defense system (immune system) attacks itself. These are called autoimmune disorders.  Being overweight.  Not being active.  Having a condition called insulin resistance.  Having a history of: ? Prediabetes. ? Diabetes when pregnant. ?  Polycystic ovarian syndrome (PCOS). What are the signs or symptoms? This condition may not cause symptoms. If you do have symptoms, they may include:  Feeling more thirsty than normal.  Needing to  pee (urinate) more often than normal.  Hunger.  Feeling very tired.  Blurry eyesight (vision). You may get other symptoms as the condition gets worse, such as:  Dry mouth.  Pain in your belly (abdomen).  Not being hungry (loss of appetite).  Breath that smells fruity.  Weakness.  Weight loss that is not planned.  A tingling or numb feeling in your hands or feet.  A headache.  Cuts or bruises that heal slowly. How is this treated? Treatment depends on the cause of your condition. Treatment may include:  Taking medicine to control your blood sugar levels.  Changing your medicine or dosage if you take insulin or other diabetes medicines.  Lifestyle changes. These may include: ? Exercising more. ? Eating healthier foods. ? Losing weight.  Treating an illness or infection.  Checking your blood sugar more often.  Stopping or reducing steroid medicines. If your condition gets very bad, you will need to be treated in the hospital. Follow these instructions at home: General instructions  Take over-the-counter and prescription medicines only as told by your doctor.  Do not smoke or use any products that contain nicotine or tobacco. If you need help quitting, ask your doctor.  If you drink alcohol: ? Limit how much you have to:  0-1 drink a day for women who are not pregnant.  0-2 drinks a day for men. ? Know how much alcohol is in a drink. In the [place] one drink equals one 12 oz bottle of beer (355 mL), one 5 oz glass of wine (148 mL), or one 1 oz glass of hard liquor (44 mL).  Manage stress. If you need help with this, ask your doctor.  Do exercises as told by your doctor.  Keep all follow-up visits. Eating and drinking  Stay at a healthy weight.  Make sure you drink enough fluid when you: ? Exercise. ? Get sick. ? Are in hot temperatures.  Drink enough fluid to keep your pee (urine) pale yellow.   If you have diabetes:  Know the symptoms of high  blood sugar.  Follow your diabetes management plan as told by your doctor. Make sure you: ? Take insulin and medicines as told. ? Follow your exercise plan. ? Follow your meal plan. Eat on time. Do not skip meals. ? Check your blood sugar as often as told. Make sure you check before and after exercise. If you exercise longer or in a different way, check your blood sugar more often. ? Follow your sick day plan whenever you cannot eat or drink normally. Make this plan ahead of time with your doctor.  Share your diabetes management plan with people in your workplace, school, and household.  Check your pee for ketones when you are ill and as told by your doctor.  Carry a card or wear jewelry that says that you have diabetes.   Where to find more information American Diabetes Association: www.diabetes.org Contact a doctor if:  Your blood sugar level is at or above 240 mg/dL (29.5 mmol/L) for 2 days in a row.  You have problems keeping your blood sugar in your target range.  You have high blood pressure often.  You have signs of illness, such as: ? Feeling like you may vomit (feeling nauseous). ? Vomiting. ? A  fever. Get help right away if:  Your blood sugar monitor reads "high" even when you are taking insulin.  You have trouble breathing.  You have a change in how you think, feel, or act (mental status).  You feel like you may vomit, and the feeling does not go away.  You cannot stop vomiting. These symptoms may be an emergency. Get medical help right away. Call your local emergency services (911 in the [place]  Do not wait to see if the symptoms will go away.  Do not drive yourself to the hospital. Summary  Hyperglycemia is when the sugar (glucose) level in your blood is too high.  High blood sugar can happen to people who have or do not have diabetes.  Make sure you drink enough fluids and follow your meal plan. Exercise as often as told by your doctor.  Contact your  doctor if you have problems keeping your blood sugar in your target range. This information is not intended to replace advice given to you by your health care provider. Make sure you discuss any questions you have with your health care provider. Document Revised: 06/22/2020 Document Reviewed: 06/22/2020 Elsevier Patient Education  2021 Elsevier Inc.  Patient verbalizes understanding of instructions provided today and agrees to view in [place].   The patient has been provided with contact information for the care management team and has been advised to call with any health related questions or concerns.  The care management team will reach out to the patient again over the next 2 weeks.   George Ina RN,BSN,CCM RN Case Manager [place]  [phone]

## 2020-11-29 NOTE — Telephone Encounter (Addendum)
Rn case manager assigned for our office, Mikey Kirschner, called to report the pts blood sugar continues to range 320-HIGH on his meter. Pt is concerned an scared to eat.  She is asking for a call back at [phone]  Thank you

## 2020-11-30 ENCOUNTER — Telehealth: Payer: Self-pay | Admitting: Family Medicine

## 2020-11-30 NOTE — Telephone Encounter (Signed)
lft VM to rtn call. Dm/cma  

## 2020-11-30 NOTE — Telephone Encounter (Signed)
Raynelle Fanning from [facility] calling and it was her 1st time seeing pt, needs recommendations for pt. Pt complaining of pain in ankles, knees, and arms and she wanted to know if there are any other pain meds pt can take besides tylenol. Would he be a candidate for [employer] since he checks blood sugar a lot?, pt still has anxiety and she is not sure if he needs his meds adjusted. Julies call back is [phone]

## 2020-12-03 ENCOUNTER — Ambulatory Visit: Payer: Medicare Other | Admitting: *Deleted

## 2020-12-03 DIAGNOSIS — J449 Chronic obstructive pulmonary disease, unspecified: Secondary | ICD-10-CM

## 2020-12-03 DIAGNOSIS — I10 Essential (primary) hypertension: Secondary | ICD-10-CM

## 2020-12-03 DIAGNOSIS — F418 Other specified anxiety disorders: Secondary | ICD-10-CM

## 2020-12-03 NOTE — Patient Instructions (Signed)
Visit Information  PATIENT GOALS: Goals Addressed            This Visit's Progress   . Continue with on-going counseling appointments       Timeframe:  Long-Range Goal Priority:  High Start Date:    11/12/20                         Expected End Date  02/19/2021         Follow Up Date 12/31/2020   Continue with on-going/scheduled counseling visits Continue taking meds as directed. Attempt in-person visits as able Set goals with counselor and work on reaching these goals    Why is this important?    Beating depression may take some time.   If you don't feel better right away, don't give up on your treatment plan.         The patient verbalized understanding of instructions, educational materials, and care plan provided today and declined offer to receive copy of patient instructions, educational materials, and care plan.   Telephone follow up appointment with care management team member scheduled for: 12/31/2020  [name] MSW, LCSW Licensed Clinical Social Worker [employer] [phone]

## 2020-12-03 NOTE — Telephone Encounter (Signed)
Nurse case manager returned call and said that Pt was concerned that his Blood sugar is still 360 to high no coverage during the day when he eats Call back for her is [phone]

## 2020-12-03 NOTE — Progress Notes (Signed)
Hello,   I have been working with pulmonology to get his Dali[employer] approved for patient assistance. We last spoke with them on 11/27/20, and will follow-up again this week to see if we have gotten any updates.   Thanks, Garey Ham Clinical Pharmacist [facility]  [phone]

## 2020-12-03 NOTE — Chronic Care Management (AMB) (Signed)
Care Management Clinical Social Work Note  12/03/2020 Name: Kenneth [name] MRN: [id number] DOB: 1961-08-12  Kenneth [name] is a 60 y.o. year old male who is a primary care patient of [name], MD.  The Care Management team was consulted for assistance with chronic disease management and coordination needs. Engaged with patient by telephone for follow up visit in response to provider referral for social work chronic care management and care coordination services Consent to Services:  Kenneth [name] was given information about Care Management services today including:  1. Care Management services includes personalized support from designated clinical staff supervised by his physician, including individualized plan of care and coordination with other care providers 2. 24/7 contact phone numbers for assistance for urgent and routine care needs. 3. The patient may stop case management services at any time by phone call to the office staff. Patient agreed to services and consent obtained.  Assessment: Review of patient past medical history, allergies, medications, and health status, including review of relevant consultants reports was performed today as part of a comprehensive evaluation and provision of chronic care management and care coordination services. SDOH (Social Determinants of Health) assessments and interventions performed:   Advanced Directives Status: Not addressed in this encounter. Care Plan  Allergies  Allergen Reactions  . Lisinopril Swelling  . Other Other (See Comments)    Lettuce : rash  . Tomato Rash    Outpatient Encounter Medications as of 12/03/2020  Medication Sig Note  . acetaminophen (TYLENOL) 325 MG tablet Take 650 mg by mouth every 6 (six) hours as needed for mild pain or headache.   . albuterol (PROVENTIL) (2.5 MG/3ML) 0.083% nebulizer solution INHALE 1 VIAL VIA NEBULIZER 5 TIMES DAILY AS NEEDED FOR WHEEZING OR FOR SHORTNESS OF BREATH (Patient taking  differently: Take 2.5 mg by nebulization every 6 (six) hours as needed for shortness of breath.)   . amLODipine (NORVASC) 5 MG tablet Take 5 mg by mouth every morning.   . ARIPiprazole (ABILIFY) 5 MG tablet Take 5 mg by mouth daily. 10/29/2020: Prescribed by  Madison Hickman, NP   . Blood Glucose Monitoring Suppl (ONE TOUCH ULTRA 2) w/Device KIT Use to test blood sugars 1-2 times daily.   . busPIRone (BUSPAR) 15 MG tablet Take 1 tablet (15 mg total) by mouth 2 (two) times daily. 10/29/2020: Prescribed by  Madison Hickman, NP    . carvedilol (COREG) 12.5 MG tablet Take 1 tablet (12.5 mg total) by mouth 2 (two) times daily with a meal.   . DM-GG & DM-APAP-CPM (CORICIDIN HBP DAY/NIGHT COLD) 10-20 &15-200-2 MG MISC Take 1 tablet by mouth every 12 (twelve) hours as needed (cold symptoms).   [name] ELIQUIS 5 MG TABS tablet TAKE ONE TABLET BY MOUTH EVERY MORNING and TAKE ONE TABLET BY MOUTH EVERY EVENING   . famotidine (PEPCID) 20 MG tablet Take 20 mg by mouth daily.   [name] FLUoxetine (PROZAC) 20 MG capsule TAKE 3 CAPSULES BY MOUTH  DAILY 10/29/2020: Prescribed by  Madison Hickman, NP   . Fluticasone-Umeclidin-Vilant (TRELEGY ELLIPTA) 100-62.5-25 MCG/INH AEPB Inhale 1 puff into the lungs daily.   . furosemide (LASIX) 40 MG tablet Take 1 tablet (40 mg total) by mouth 2 (two) times daily.   [name] gabapentin (NEURONTIN) 100 MG capsule Take 2 capsules (200 mg total) by mouth 3 (three) times daily.   [name] glucose blood (ONETOUCH ULTRA) test strip USE TO TEST BLOOD SUGAR 1-2 TIMES DAILY   . guaiFENesin (MUCINEX) 600 MG 12 hr tablet Take  1 tablet (600 mg total) by mouth 2 (two) times daily.   . hydrALAZINE (APRESOLINE) 25 MG tablet Take 1 tablet (25 mg total) by mouth 3 (three) times daily.   . insulin glargine (LANTUS SOLOSTAR) 100 UNIT/ML Solostar Pen Inject 35 Units into the skin at bedtime. If fasting blood sugars remain >150 for 1 week, increase insulin dose by 5 units. Maximum of 80 units.   . Insulin Pen Needle (PEN  NEEDLES) 31G X 6 MM MISC 1 application by Does not apply route at bedtime.   [name] ipratropium-albuterol (DUONEB) 0.5-2.5 (3) MG/3ML SOLN Take 3 mLs by nebulization every 6 (six) hours as needed. (Patient taking differently: Take 3 mLs by nebulization every 6 (six) hours as needed (For shortness of breath).)   . isosorbide mononitrate (IMDUR) 30 MG 24 hr tablet Take 30 mg by mouth daily.   . Lancets (ONETOUCH ULTRASOFT) lancets USE TO TEST BLOOD SUGAR 1-2 TIMES DAILY   . lubiprostone ([name]) 8 MCG capsule Take 1 capsule (8 mcg total) by mouth 2 (two) times daily with a meal.   . Multiple Vitamin (MULTIVITAMIN WITH MINERALS) TABS tablet Take 1 tablet by mouth daily.   . nitroGLYCERIN (NITROSTAT) 0.4 MG SL tablet DISSOLVE 1 TABLET UNDER THE TONGUE EVERY 5 MINUTES AS&nbsp;&nbsp;NEEDED FOR CHEST PAIN. MAX&nbsp;&nbsp;OF 3 TABLETS IN 15 MINUTES. CALL 911 IF PAIN PERSISTS. (Patient taking differently: Place 0.4 mg under the tongue every 5 (five) minutes as needed for chest pain.) 10/04/2020: Last filled 07/25/20  . pantoprazole (PROTONIX) 40 MG tablet TAKE ONE TABLET BY MOUTH EVERY MORNING   . potassium chloride SA (KLOR-CON) 20 MEQ tablet Take 2 tablets (40 mEq total) by mouth daily.   . predniSONE (DELTASONE) 10 MG tablet Take 1 tablet (10 mg total) by mouth daily with breakfast.   . Roflumilast (DALIRESP) 250 MCG TABS Take 250 mcg by mouth daily. (Patient not taking: No sig reported)   . rosuvastatin (CRESTOR) 20 MG tablet TAKE ONE TABLET BY MOUTH ONCE DAILY   . spironolactone (ALDACTONE) 25 MG tablet Take 1 tablet (25 mg total) by mouth daily.    No facility-administered encounter medications on file as of 12/03/2020.    Patient Active Problem List   Diagnosis Date Noted  . Acute on chronic respiratory failure with hypoxia ([place]) 10/23/2020  . Cardiac arrest ([place]) 10/04/2020  . Acute kidney injury superimposed on CKD ([place]) 09/23/2020  . Thiamine deficiency 07/02/2020  . Obstructive sleep apnea  05/02/2020  . Hyperlipidemia   . Acute on chronic combined systolic (congestive) and diastolic (congestive) heart failure ([place]) 04/07/2020  . Chronic constipation 07/19/2019  . Shortness of breath 06/12/2019  . Snoring 03/29/2019  . Insomnia due to other mental disorder 11/15/2018  . Anticoagulant long-term use 10/21/2018  . Gastroesophageal reflux disease without esophagitis 08/10/2018  . Tobacco abuse 08/10/2018  . Chest pain 07/27/2018  . Essential hypertension 07/06/2018  . Chronic obstructive pulmonary disease ([place]) 07/06/2018  . Alcohol abuse 07/06/2018  . Paroxysmal atrial fibrillation ([place]) 11/28/2017  . Slow transit constipation 06/07/2016  . Chronic anemia 06/06/2016  . Hypertensive kidney disease with chronic kidney disease stage III ([place]) 06/06/2016  . Mixed anxiety and depressive disorder 06/05/2016  . Type 2 diabetes mellitus with insulin therapy ([place]) 06/05/2016  . Chronic obstructive pulmonary disease with (acute) exacerbation ([place]) 06/05/2016    Conditions to be addressed/monitored: Anxiety and Depression; Mental Health Concerns   Care Plan : LCSW Plan of Care  Updates made by Kenneth Peer, LCSW since  12/03/2020 12:00 AM    Problem: Knowledge barriers related to depression   Priority: High  Note:    Current barriers:   . Chronic Mental Health needs related to   chronic depression . Currently working towards self management of needs related to mental health conditions. . Needs Support, Education, and Care Coordination in order to meet unmet needs  . Recently accepted under Palliative Care and adjusting to new goals/coping Clinical Goal(s): Over the next 21 days, patient will work with SW to reduce or manage symptoms of depression until connected for ongoing counseling.  Clinical Interventions:  . Assessed patient's previous treatment, needs, coping skills, current treatment, support system and barriers to care . Patient interviewed and appropriate  assessments performed or reviewed: PHQ 9;Suicidal Ideation/Homicidal Ideation: No . Provided basic mental health support, education and interventions  . Reviewed mental health medications with patient prescribed by PCP and discussed compliance  . Other interventions include: Motivational Interviewing, Solution-Focused Strategies, Mindfulness or Psychologist, educational, Emotional/Supportive Counseling, and Problem Solving  . Collaboration with PCP regarding development and update of comprehensive plan of care as evidenced by provider attestation and co-signature . Inter-disciplinary care team collaboration (see longitudinal plan of care) Patient Goals/Self-Care Activities:  . Over the next 21 days . Continue with bi-weekly counseling visits . Attempt in-person visits as able . Set goals with counselor   Follow Up Plan: 12/03/2020   Long-Range Goal: Develop long term plan for self management of depression   Start Date: 11/12/2020  This Visit's Progress: On track  Recent Progress: On track  Priority: High  Note:   Current Barriers:  . Chronic Mental Health needs related to depression and health conditions-pt has been a counseling sessions (virtually and some in person) and feels this is helping to "give me a sense of direction" and to set goals. . Per pt, he is attending in person and about 3 times per month . Pt is developing coping skills around his anxiety/depression and is "motivated" by the therapy . Suicidal Ideation/Homicidal Ideation: No  Clinical Social Work Goal(s):  . patient will work with CSW by telephone and with Counselor to reduce/manage symptoms related to depression . Patient will take medications as ordered . Patient will work on his "letting things go" and "release" of his anxiety as he anticipates things agitating him  Interventions: . Patient interviewed and appropriate assessments performed; reports his depression and anxiety are "low" today (4 out of 10) . Discussed  plans with patient for ongoing care management follow up and provided patient with direct contact information for care management team . [facility]ated with [facility] regarding pt's inquiring about the inhaler he "can't afford and was waiting to hear about" . Motivational Interviewing . Solution-Focused Strategies . Emotional/Supportive Counseling . Psychoeducation and/or Health Education . Problem Solving  Patient Self Care Activities:  . Self administers medications as prescribed, Attends all scheduled provider appointments, Calls provider office for new concerns or questions, and Motivation for treatment  Patient Coping Strengths:  . Supportive Relationships . Hopefulness . Able to Communicate Effectively  Patient Self Care Deficits:  . Recent transition to palliative care support and pt's need for understanding/accepting their plan of care  Patient Goals: Continue with on-going/scheduled counseling visits Continue taking meds as directed. Attempt in-person visits as able Set goals with counselor and work on reaching these goals  Follow Up Plan: SW will follow up with patient by phone over the next  4 weeks.        Follow Up Plan:  SW will follow up with patient by phone over the next 12/31/2020 and Appointment scheduled for SW follow up with client by phone on:   Eduard Clos MSW, LCSW Licensed Clinical Social Worker [place] [phone]

## 2020-12-03 NOTE — Telephone Encounter (Signed)
Use voltaren gel prn as needed for joint aches and pains continue tylenol.

## 2020-12-03 NOTE — Telephone Encounter (Signed)
Patient has an appt 12/04/20 with Dr Veto Kemps. Dm/cma

## 2020-12-03 NOTE — Telephone Encounter (Signed)
lft VM to rtn call and advise that patient has appt tomorrow. Dm/cma

## 2020-12-03 NOTE — Telephone Encounter (Signed)
Lft VM to rtn call. Dm/cma  

## 2020-12-04 ENCOUNTER — Telehealth: Payer: Medicare Other

## 2020-12-04 ENCOUNTER — Ambulatory Visit: Payer: Self-pay

## 2020-12-04 ENCOUNTER — Other Ambulatory Visit: Payer: Self-pay

## 2020-12-04 NOTE — Chronic Care Management (AMB) (Signed)
   12/04/2020  Kenneth [name] April 18, 1961 [id number]   Telephone message received from [facility] with Dr. Evangeline Gula office on 12/03/2020.  Attempted return call to office. Message left with [facility] requesting return call.   Today telephone message received from [place].  Return call made.  Informed Angelique Blonder that patient was contacted on 3/05/23/2021 and voiced concerns regarding his still elevated blood sugars ranging from 360's to Hi on his meter.  Patient concerned he does not have insulin coverage during the day for meals.  Angelique Blonder reports patient is scheduled to see Dr. Veto Kemps on 12/05/2020 for follow up visit. She states she will follow up with this RNCM after patients appointment to update.   George Ina RN,BSN,CCM RN Case Manager [facility] [place]  [phone]

## 2020-12-04 NOTE — Telephone Encounter (Signed)
Returned [place] call not answer LMTCB

## 2020-12-04 NOTE — Telephone Encounter (Signed)
Spoke to [place], Scientist, physiological, and will call her after his appt with Dr Veto Kemps on 12/05/20 to advise on plan for patients diabetes.  Dm/cma

## 2020-12-05 ENCOUNTER — Other Ambulatory Visit: Payer: Self-pay

## 2020-12-05 ENCOUNTER — Ambulatory Visit (INDEPENDENT_AMBULATORY_CARE_PROVIDER_SITE_OTHER): Payer: Medicare Other | Admitting: Family Medicine

## 2020-12-05 ENCOUNTER — Telehealth: Payer: Medicare Other

## 2020-12-05 ENCOUNTER — Encounter: Payer: Self-pay | Admitting: Family Medicine

## 2020-12-05 ENCOUNTER — Ambulatory Visit: Payer: Medicare Other | Admitting: Cardiology

## 2020-12-05 ENCOUNTER — Ambulatory Visit: Payer: Medicare Other | Admitting: Family Medicine

## 2020-12-05 VITALS — BP 142/76 | HR 67 | Temp 97.9°F | Ht 72.0 in | Wt 188.0 lb

## 2020-12-05 DIAGNOSIS — E1140 Type 2 diabetes mellitus with diabetic neuropathy, unspecified: Secondary | ICD-10-CM

## 2020-12-05 DIAGNOSIS — Z794 Long term (current) use of insulin: Secondary | ICD-10-CM

## 2020-12-05 MED ORDER — LANTUS SOLOSTAR 100 UNIT/ML ~~LOC~~ SOPN: 45.0000 [IU] | PEN_INJECTOR | Freq: Every day | SUBCUTANEOUS | 2 refills | Status: DC

## 2020-12-05 NOTE — Progress Notes (Signed)
[facility] [address] [place] [address] Dept: [phone] Dept Fax: [phone]  Acute Office Visit  Subjective:    Patient ID: Kenneth [name], male    DOB: [birth year], 60 y.o..   MRN: [id number]  Chief Complaint  Patient presents with  . Follow-up    2 week f/u diabetes.   Average BS 300- 350.      History of Present Illness:  Patient is in today for reassessment of his blood sugars. He was seen on 2/23 due to a recent change in his medication that had contributed to hyperglycemia. He was in the hospital several weeks prior to that for an exacerbation of heart failure. At discharge, his metformin had been stopped (presumably due to its negative impact on CHF) and he had been started on Lantus 10 units qhs for diabetes management. Since discharge, he had noted his blood sugars to be running > 500. We are working with him to gradually increase the dose of his Lantus. His last increase put him at 35 units daily.  Mr. Wilsey notes his fasting blood sugar this morning was 405. He has recently seen blood sugars averaging 300-350. He notes that he is starting to feel some improvement in his overall well being with the lower blood sugars.  Mr. Sally has upcoming appointments with both his cardiologist and pulmonologist.  Past Medical History: Patient Active Problem List   Diagnosis Date Noted  . Acute on chronic respiratory failure with hypoxia ([place]) 10/23/2020  . Cardiac arrest ([place]) 10/04/2020  . Acute kidney injury superimposed on CKD ([place]) 09/23/2020  . Thiamine deficiency 07/02/2020  . Obstructive sleep apnea 05/02/2020  . Hyperlipidemia   . Acute on chronic combined systolic (congestive) and diastolic (congestive) heart failure ([place]) 04/07/2020  . Chronic constipation 07/19/2019  . Snoring 03/29/2019  . Insomnia due to other mental disorder 11/15/2018  . Anticoagulant long-term use 10/21/2018  . Gastroesophageal  reflux disease without esophagitis 08/10/2018  . Tobacco abuse 08/10/2018  . Chest pain 07/27/2018  . Essential hypertension 07/06/2018  . Chronic obstructive pulmonary disease ([place]) 07/06/2018  . Alcohol abuse 07/06/2018  . Paroxysmal atrial fibrillation ([place]) 11/28/2017  . Slow transit constipation 06/07/2016  . Chronic anemia 06/06/2016  . Hypertensive kidney disease with chronic kidney disease stage III ([place]) 06/06/2016  . Mixed anxiety and depressive disorder 06/05/2016  . Type 2 diabetes mellitus with insulin therapy ([place]) 06/05/2016  . Chronic obstructive pulmonary disease with (acute) exacerbation ([place]) 06/05/2016   Past Surgical History:  Procedure Laterality Date  . NO PAST SURGERIES     Family History  Problem Relation Age of Onset  . Diabetes Mother   . Heart disease Mother   . Diabetes Father   . Heart disease Father   . Diabetes Sister   . Heart disease Sister   . Kidney disease Sister   . Coronary artery disease Brother   . Liver disease Maternal Uncle    Outpatient Medications Prior to Visit  Medication Sig Dispense Refill  . acetaminophen (TYLENOL) 325 MG tablet Take 650 mg by mouth every 6 (six) hours as needed for mild pain or headache.    . albuterol (PROVENTIL) (2.5 MG/3ML) 0.083% nebulizer solution INHALE 1 VIAL VIA NEBULIZER 5 TIMES DAILY AS NEEDED FOR WHEEZING OR FOR SHORTNESS OF BREATH (Patient taking differently: Take 2.5 mg by nebulization every 6 (six) hours as needed for shortness of breath.) 150 mL 1  . amLODipine (NORVASC) 5 MG tablet Take 5  mg by mouth every morning.    . ARIPiprazole (ABILIFY) 5 MG tablet Take 5 mg by mouth daily.    . Blood Glucose Monitoring Suppl (ONE TOUCH ULTRA 2) w/Device KIT Use to test blood sugars 1-2 times daily. 1 kit 0  . busPIRone (BUSPAR) 15 MG tablet Take 1 tablet (15 mg total) by mouth 2 (two) times daily. 180 tablet 1  . carvedilol (COREG) 12.5 MG tablet Take 1 tablet (12.5 mg total) by mouth 2 (two) times daily  with a meal. 180 tablet 3  . DM-GG & DM-APAP-CPM (CORICIDIN HBP DAY/NIGHT COLD) 10-20 &15-200-2 MG MISC Take 1 tablet by mouth every 12 (twelve) hours as needed (cold symptoms).    [name] ELIQUIS 5 MG TABS tablet TAKE ONE TABLET BY MOUTH EVERY MORNING and TAKE ONE TABLET BY MOUTH EVERY EVENING 180 tablet 1  . famotidine (PEPCID) 20 MG tablet Take 20 mg by mouth daily.    [name] FLUoxetine (PROZAC) 20 MG capsule TAKE 3 CAPSULES BY MOUTH  DAILY 270 capsule 3  . Fluticasone-Umeclidin-Vilant (TRELEGY ELLIPTA) 100-62.5-25 MCG/INH AEPB Inhale 1 puff into the lungs daily. 60 each 5  . furosemide (LASIX) 40 MG tablet Take 1 tablet (40 mg total) by mouth 2 (two) times daily. 60 tablet 11  . gabapentin (NEURONTIN) 100 MG capsule Take 2 capsules (200 mg total) by mouth 3 (three) times daily. 180 capsule 3  . glucose blood (ONETOUCH ULTRA) test strip USE TO TEST BLOOD SUGAR 1-2 TIMES DAILY 100 each 11  . guaiFENesin (MUCINEX) 600 MG 12 hr tablet Take 1 tablet (600 mg total) by mouth 2 (two) times daily. 30 tablet 0  . hydrALAZINE (APRESOLINE) 25 MG tablet Take 1 tablet (25 mg total) by mouth 3 (three) times daily. 90 tablet 5  . Insulin Pen Needle (PEN NEEDLES) 31G X 6 MM MISC 1 application by Does not apply route at bedtime. 100 each 1  . ipratropium-albuterol (DUONEB) 0.5-2.5 (3) MG/3ML SOLN Take 3 mLs by nebulization every 6 (six) hours as needed. (Patient taking differently: Take 3 mLs by nebulization every 6 (six) hours as needed (For shortness of breath).) 360 mL 0  . isosorbide mononitrate (IMDUR) 30 MG 24 hr tablet Take 30 mg by mouth daily.    . Lancets (ONETOUCH ULTRASOFT) lancets USE TO TEST BLOOD SUGAR 1-2 TIMES DAILY 100 each 11  . lubiprostone ([name]) 8 MCG capsule Take 1 capsule (8 mcg total) by mouth 2 (two) times daily with a meal. 60 capsule 0  . Multiple Vitamin (MULTIVITAMIN WITH MINERALS) TABS tablet Take 1 tablet by mouth daily.    . nitroGLYCERIN (NITROSTAT) 0.4 MG SL tablet DISSOLVE 1 TABLET  UNDER THE TONGUE EVERY 5 MINUTES AS&nbsp;&nbsp;NEEDED FOR CHEST PAIN. MAX&nbsp;&nbsp;OF 3 TABLETS IN 15 MINUTES. CALL 911 IF PAIN PERSISTS. (Patient taking differently: Place 0.4 mg under the tongue every 5 (five) minutes as needed for chest pain.) 75 tablet 4  . pantoprazole (PROTONIX) 40 MG tablet TAKE ONE TABLET BY MOUTH EVERY MORNING 30 tablet 4  . potassium chloride SA (KLOR-CON) 20 MEQ tablet Take 2 tablets (40 mEq total) by mouth daily. 60 tablet 5  . predniSONE (DELTASONE) 10 MG tablet Take 1 tablet (10 mg total) by mouth daily with breakfast. 30 tablet 2  . rosuvastatin (CRESTOR) 20 MG tablet TAKE ONE TABLET BY MOUTH ONCE DAILY 30 tablet 3  . spironolactone (ALDACTONE) 25 MG tablet Take 1 tablet (25 mg total) by mouth daily. 30 tablet 0  . insulin glargine (LANTUS  SOLOSTAR) 100 UNIT/ML Solostar Pen Inject 35 Units into the skin at bedtime. If fasting blood sugars remain >150 for 1 week, increase insulin dose by 5 units. Maximum of 80 units. 15 mL 2  . Roflumilast (DALIRESP) 250 MCG TABS Take 250 mcg by mouth daily. (Patient not taking: No sig reported) 30 tablet 11   No facility-administered medications prior to visit.   Allergies  Allergen Reactions  . Lisinopril Swelling  . Other Other (See Comments)    Lettuce : rash  . Tomato Rash   Objective:   Today's Vitals   12/05/20 0957  BP: (!) 142/76  Pulse: 67  Temp: 97.9 F (36.6 C)  TempSrc: Temporal  SpO2: 98%  Weight: 188 lb (85.3 kg)  Height: 6' (1.829 m)   Body mass index is 25.5 kg/m.   General: Well developed, well nourished. No acute distress. Psych: Alert and oriented x3. Normal mood and affect.  Health Maintenance Due  Topic Date Due  . COLONOSCOPY (Pts 45-18yr Insurance coverage will need to be confirmed)  09/23/2019  . OPHTHALMOLOGY EXAM  05/12/2020     Assessment & Plan:   1. Type 2 diabetes mellitus with diabetic neuropathy, with long-term current use of insulin ([facility] Mr. DMattersblood sugars are  starting to gradually decrease. I will increase his Lantus dose to 45 units. I will have my MA check with him in 1 week. If his fasting blood sugars remain above 200, we will increase his Lantus to 50 units daily. I will see him back in 2 weeks for assessment.  - insulin glargine (LANTUS SOLOSTAR) 100 UNIT/ML Solostar Pen; Inject 45 Units into the skin at bedtime. If fasting blood sugars remain >150 for 1 week, increase insulin dose by 5 units. Maximum of 80 units.  Dispense: 15 mL; Refill: 2  SHaydee Salter MD

## 2020-12-05 NOTE — Telephone Encounter (Signed)
Lft VM to rtn call to advise on plans for patients BS treatment and f/u plans. Dm/cma

## 2020-12-05 NOTE — Telephone Encounter (Signed)
Tyson Alias, RN case manager, notified VIA phone of changes and f/u plans.Dm/cma

## 2020-12-05 NOTE — Telephone Encounter (Signed)
No return call from Raynelle Fanning, spoke with patient who verbally understood he could use Voltaren gel on the painful areas if no improvement to give [place] a call back.

## 2020-12-07 ENCOUNTER — Encounter: Payer: Self-pay | Admitting: Adult Health

## 2020-12-07 ENCOUNTER — Other Ambulatory Visit: Payer: Self-pay

## 2020-12-07 ENCOUNTER — Ambulatory Visit (INDEPENDENT_AMBULATORY_CARE_PROVIDER_SITE_OTHER): Payer: Medicare Other | Admitting: Adult Health

## 2020-12-07 DIAGNOSIS — J441 Chronic obstructive pulmonary disease with (acute) exacerbation: Secondary | ICD-10-CM

## 2020-12-07 DIAGNOSIS — G4733 Obstructive sleep apnea (adult) (pediatric): Secondary | ICD-10-CM | POA: Diagnosis not present

## 2020-12-07 DIAGNOSIS — J9611 Chronic respiratory failure with hypoxia: Secondary | ICD-10-CM

## 2020-12-07 DIAGNOSIS — B370 Candidal stomatitis: Secondary | ICD-10-CM | POA: Diagnosis not present

## 2020-12-07 DIAGNOSIS — I5043 Acute on chronic combined systolic (congestive) and diastolic (congestive) heart failure: Secondary | ICD-10-CM | POA: Diagnosis not present

## 2020-12-07 DIAGNOSIS — J449 Chronic obstructive pulmonary disease, unspecified: Secondary | ICD-10-CM

## 2020-12-07 MED ORDER — NYSTATIN 100000 UNIT/ML MT SUSP: 5.0000 mL | Freq: Four times a day (QID) | OROMUCOSAL | 0 refills | Status: DC

## 2020-12-07 MED ORDER — CLOTRIMAZOLE 10 MG MT TROC: 10.0000 mg | Freq: Every day | OROMUCOSAL | 0 refills | Status: DC

## 2020-12-07 NOTE — Assessment & Plan Note (Signed)
Recurrent admissions patient is continue on current regimen.  Continue follow-up with cardiology

## 2020-12-07 NOTE — Assessment & Plan Note (Addendum)
Severe GOLD D COPD prone to frequent flares , high risk for decompensation  Continue on triple inhaler/ low dose steroids. , nebs .  Daliresp pending - patient assistance pending .  Cont on O2  Cont w/ home exercises. Consider pulm rehab going forward  Needs Alpha one testing .   Plan  There are no Patient Instructions on file for this visit.

## 2020-12-07 NOTE — Assessment & Plan Note (Signed)
Recurrent COPD exacerbations with multiple hospitalizations.  Patient has very severe COPD has high symptom burden which is oxygen dependent.  He is on multiple maintenance medications including triple inhaler therapy.  He has been continued on low-dose steroids.  Has been recommended on Daliresp but insurance declines.  Patient assistance has been submitted. He is on nocturnal CPAP. Now enrolled in palliative care Continue aggressive pulmonary hygiene regimen.  Plan  Patient Instructions  Continue on TRELEGY 1 puff daily, rinse after use.  Begin Daliresp daily  When available  Mucinex DM Twice daily  As needed  Cough/congestion  Flutter valve Three times a day   Continue on Duoneb nebs Three times a day   Continue on Oxygen 2l/m rest and 3l/m walking .  Continue on Prednisone to 10mg  daily  Continue on CPAP At bedtime  . -need to wear all night long and naps.  Use caution with sedating medications  Labs today  Continue with Palliative care .  Follow up with Dr. or Michelangelo Rindfleisch NP in 6 weeks  and As needed  Please contact office for sooner follow up if symptoms do not improve or worsen or seek emergency care

## 2020-12-07 NOTE — Assessment & Plan Note (Signed)
Compensated on O2  

## 2020-12-07 NOTE — Progress Notes (Addendum)
_0  ID: Kenneth [name], male    DOB: 27-Aug-1961, 60 y.o.   MRN: [id number]  Chief Complaint  Patient presents with  . Follow-up    Referring provider: Libby [name]  HPI: 60 year old male former smoker for very severe COPD with asthma -prone to recurrent exacerbations, obstructive sleep apnea on nocturnal CPAP Medical history significant for A. fib on chronic anticoagulation, diabetes, hypertension, congestive heart failure and nonischemic cardiomyopathy  TEST/EVENTS :  Pulmonary function testing May 02, 2020 showed FEV1 at 25%, ratio 42, FVC 48%, no significant bronchodilator response., Severe mid flow obstruction with reversibility, DLCO 50%.  2D echo April 07, 2020 EF 45 to 50%, normal pulmonary artery systolic pressure  2D echo October 04, 2020 EF 55 to 85%, RV systolic function is normal  12/07/2020 Follow up ; COPD with asthma, obstructive sleep apnea, oxygen dependent respiratory failure Patient presents for a 6-week follow-up.  Patient has underlying COPD with asthma and is prone to recurrent exacerbations.  He has had multiple hospitalizations over the last few months and has been intubated on 2 separate occasions for acute on chronic respiratory failure, COPD flare and decompensated heart failure.  He also suffered an out of hospital PEA arrest with ROSC achieved after 5 minutes of CPR and 1 round of epi.  Recent admission he was treated with IV antibiotics, steroids, nebulized bronchodilators.  Sputum culture was abundant for pansensitive Pseudomonas.  He was started on Daliresp at discharge.  And a slow prednisone taper.  Daliresp was declined by his insurance company despite multiple hospitalizations.  Patient assistance paperwork was completed. Admitted again last month for acute on chronic respiratory failure with decompensated CHF , improved with diuresis . Treated for COPD flare with IV abx and steroids . Started on Palliative care at home .  Patient is on  multiple medications for anxiety including BuSpar, Neurontin, Prozac and trazodone.  Patient has underlying sleep apnea.  Is on nocturnal CPAP.  Patient says he is trying to use it every single night.  CPAP download shows significant improved compliance with 100% daily usage at 6.5 hours.  Patient is on auto CPAP 13 to 20 cm H2O.  AHI 0.1. feels he is benefiting from CPAP .  Remains on Prednisone 54m daily . Trelegy inhaler daily .   Since last visit patient is starting to feel some better.  Remains very weak with low activity tolerance.  Is receiving physical therapy at home.  He is also getting nursing assistance through home health. Finish PT this week.  Feels stronger. Better balance. Has home exercises to work on .     Allergies  Allergen Reactions  . Lisinopril Swelling  . Other Other (See Comments)    Lettuce : rash  . Tomato Rash    Immunization History  Administered Date(s) Administered  . Influenza,inj,Quad PF,6+ Mos 06/25/2018, 05/20/2019  . Influenza-Unspecified 06/28/2020  . PFIZER(Purple Top)SARS-COV-2 Vaccination 12/22/2019, 01/16/2020, 10/29/2020  . Pneumococcal Polysaccharide-23 06/08/2016  . Zoster Recombinat (Shingrix) 07/22/2019    Past Medical History:  Diagnosis Date  . Anxiety   . Arthritis   . Atrial fibrillation ([facility]   . Chest tightness 06/12/2019  . CHF (congestive heart failure) ([facility]   . COPD (chronic obstructive pulmonary disease) (HCC)    emphysema  . Depression   . Diabetes mellitus without complication ([facility]   . Heart murmur   . Hyperlipidemia   . Hypertension   . Sleep apnea with use of continuous positive airway pressure (CPAP)  Tobacco History: Social History   Tobacco Use  Smoking Status Former Smoker  . Packs/day: 2.00  . Years: 30.00  . Pack years: 60.00  . Types: Cigarettes  . Quit date: 05/01/2019  . Years since quitting: 1.6  Smokeless Tobacco Never Used  Tobacco Comment   smokes 2-3cigs per day   Counseling given:  Not Answered Comment: smokes 2-3cigs per day   Outpatient Medications Prior to Visit  Medication Sig Dispense Refill  . acetaminophen (TYLENOL) 325 MG tablet Take 650 mg by mouth every 6 (six) hours as needed for mild pain or headache.    . albuterol (PROVENTIL) (2.5 MG/3ML) 0.083% nebulizer solution INHALE 1 VIAL VIA NEBULIZER 5 TIMES DAILY AS NEEDED FOR WHEEZING OR FOR SHORTNESS OF BREATH (Patient taking differently: Take 2.5 mg by nebulization every 6 (six) hours as needed for shortness of breath.) 150 mL 1  . amLODipine (NORVASC) 5 MG tablet Take 5 mg by mouth every morning.    . ARIPiprazole (ABILIFY) 5 MG tablet Take 5 mg by mouth daily.    . Blood Glucose Monitoring Suppl (ONE TOUCH ULTRA 2) w/Device KIT Use to test blood sugars 1-2 times daily. 1 kit 0  . busPIRone (BUSPAR) 15 MG tablet Take 1 tablet (15 mg total) by mouth 2 (two) times daily. 180 tablet 1  . carvedilol (COREG) 12.5 MG tablet Take 1 tablet (12.5 mg total) by mouth 2 (two) times daily with a meal. 180 tablet 3  . DM-GG & DM-APAP-CPM (CORICIDIN HBP DAY/NIGHT COLD) 10-20 &15-200-2 MG MISC Take 1 tablet by mouth every 12 (twelve) hours as needed (cold symptoms).    [name] ELIQUIS 5 MG TABS tablet TAKE ONE TABLET BY MOUTH EVERY MORNING and TAKE ONE TABLET BY MOUTH EVERY EVENING 180 tablet 1  . famotidine (PEPCID) 20 MG tablet Take 20 mg by mouth daily.    [name] FLUoxetine (PROZAC) 20 MG capsule TAKE 3 CAPSULES BY MOUTH  DAILY 270 capsule 3  . Fluticasone-Umeclidin-Vilant (TRELEGY ELLIPTA) 100-62.5-25 MCG/INH AEPB Inhale 1 puff into the lungs daily. 60 each 5  . furosemide (LASIX) 40 MG tablet Take 1 tablet (40 mg total) by mouth 2 (two) times daily. 60 tablet 11  . gabapentin (NEURONTIN) 100 MG capsule Take 2 capsules (200 mg total) by mouth 3 (three) times daily. 180 capsule 3  . glucose blood (ONETOUCH ULTRA) test strip USE TO TEST BLOOD SUGAR 1-2 TIMES DAILY 100 each 11  . guaiFENesin (MUCINEX) 600 MG 12 hr tablet Take 1 tablet  (600 mg total) by mouth 2 (two) times daily. 30 tablet 0  . hydrALAZINE (APRESOLINE) 25 MG tablet Take 1 tablet (25 mg total) by mouth 3 (three) times daily. 90 tablet 5  . insulin glargine (LANTUS SOLOSTAR) 100 UNIT/ML Solostar Pen Inject 45 Units into the skin at bedtime. If fasting blood sugars remain >150 for 1 week, increase insulin dose by 5 units. Maximum of 80 units. 15 mL 2  . Insulin Pen Needle (PEN NEEDLES) 31G X 6 MM MISC 1 application by Does not apply route at bedtime. 100 each 1  . ipratropium-albuterol (DUONEB) 0.5-2.5 (3) MG/3ML SOLN Take 3 mLs by nebulization every 6 (six) hours as needed. (Patient taking differently: Take 3 mLs by nebulization every 6 (six) hours as needed (For shortness of breath).) 360 mL 0  . isosorbide mononitrate (IMDUR) 30 MG 24 hr tablet Take 30 mg by mouth daily.    . Lancets (ONETOUCH ULTRASOFT) lancets USE TO TEST BLOOD SUGAR 1-2 TIMES  DAILY 100 each 11  . lubiprostone ([name]) 8 MCG capsule Take 1 capsule (8 mcg total) by mouth 2 (two) times daily with a meal. 60 capsule 0  . Multiple Vitamin (MULTIVITAMIN WITH MINERALS) TABS tablet Take 1 tablet by mouth daily.    . nitroGLYCERIN (NITROSTAT) 0.4 MG SL tablet DISSOLVE 1 TABLET UNDER THE TONGUE EVERY 5 MINUTES AS  NEEDED FOR CHEST PAIN. MAX  OF 3 TABLETS IN 15 MINUTES. CALL 911 IF PAIN PERSISTS. (Patient taking differently: Place 0.4 mg under the tongue every 5 (five) minutes as needed for chest pain.) 75 tablet 4  . pantoprazole (PROTONIX) 40 MG tablet TAKE ONE TABLET BY MOUTH EVERY MORNING 30 tablet 4  . potassium chloride SA (KLOR-CON) 20 MEQ tablet Take 2 tablets (40 mEq total) by mouth daily. 60 tablet 5  . predniSONE (DELTASONE) 10 MG tablet Take 1 tablet (10 mg total) by mouth daily with breakfast. 30 tablet 2  . rosuvastatin (CRESTOR) 20 MG tablet TAKE ONE TABLET BY MOUTH ONCE DAILY 30 tablet 3  . Roflumilast (DALIRESP) 250 MCG TABS Take 250 mcg by mouth daily. (Patient not taking: Reported on  12/07/2020) 30 tablet 11  . spironolactone (ALDACTONE) 25 MG tablet Take 1 tablet (25 mg total) by mouth daily. 30 tablet 0   No facility-administered medications prior to visit.     Review of Systems:   Constitutional:   No  weight loss, night sweats,  Fevers, chills,  +fatigue, or  lassitude.  HEENT:   No headaches,  Difficulty swallowing,  Tooth/dental problems, or  Sore throat,                No sneezing, itching, ear ache, nasal congestion, post nasal drip,   CV:  No chest pain,  Orthopnea, PND, swelling in lower extremities, anasarca, dizziness, palpitations, syncope.   GI  No heartburn, indigestion, abdominal pain, nausea, vomiting, diarrhea, change in bowel habits, loss of appetite, bloody stools.   Resp:   No chest wall deformity  Skin: no rash or lesions.  GU: no dysuria, change in color of urine, no urgency or frequency.  No flank pain, no hematuria   MS:  No joint pain or swelling.  No decreased range of motion.  No back pain.    Physical Exam  BP (!) 150/80 (BP Location: Left Arm, Cuff Size: Normal)   Pulse 60   Temp 97.6 F (36.4 C)   Ht 6' (1.829 m)   Wt 189 lb (85.7 kg)   SpO2 96%   BMI 25.63 kg/m   GEN: A/Ox3; pleasant , NAD, elderly , O2    HEENT:  [place]/AT,  EACs-clear, TMs-wnl, NOSE-clear,   Oral mucosa with scattered white patches consistent with thrush  NECK:  Supple w/ fair ROM; no JVD; normal carotid impulses w/o bruits; no thyromegaly or nodules palpated; no lymphadenopathy.    RESP  Decreased BS in bases   no accessory muscle use, no dullness to percussion  CARD:  RRR, no m/r/g, tr  peripheral edema, pulses intact, no cyanosis or clubbing.  GI:   Soft & nt; nml bowel sounds; no organomegaly or masses detected.   Musco: Warm bil, no deformities or joint swelling noted.   Neuro: alert, no focal deficits noted.    Skin: Warm, no lesions or rashes    Lab Results:  CBC    Component Value Date/Time   WBC 9.6 10/30/2020 0800   RBC  3.94 (L) 10/30/2020 0800   HGB 11.0 (L) 10/30/2020  0800   HCT 34.2 (L) 10/30/2020 0800   PLT 240.0 10/30/2020 0800   MCV 86.9 10/30/2020 0800   MCH 28.7 10/24/2020 0228   MCHC 32.1 10/30/2020 0800   RDW 16.0 (H) 10/30/2020 0800   LYMPHSABS 0.6 (L) 10/30/2020 0800   MONOABS 0.5 10/30/2020 0800   EOSABS 0.0 10/30/2020 0800   BASOSABS 0.0 10/30/2020 0800    BMET    Component Value Date/Time   NA 132 (L) 10/30/2020 0800   NA 134 07/05/2020 0853   K 4.9 10/30/2020 0800   CL 87 (L) 10/30/2020 0800   CO2 40 (H) 10/30/2020 0800   GLUCOSE 324 (H) 10/30/2020 0800   BUN 16 10/30/2020 0800   BUN 9 07/05/2020 0853   CREATININE 1.51 (H) 10/30/2020 0800   CALCIUM 9.7 10/30/2020 0800   GFRNONAA >60 10/24/2020 0228   GFRAA 85 07/05/2020 0853    BNP    Component Value Date/Time   BNP 1,701.2 (H) 10/23/2020 0417    ProBNP No results found for: PROBNP  Imaging: No results found.    PFT Results Latest Ref Rng & Units 05/02/2020  FVC-Pre L 1.90  FVC-Predicted Pre % 45  FVC-Post L 2.06  FVC-Predicted Post % 48  Pre FEV1/FVC % % 43  Post FEV1/FCV % % 42  FEV1-Pre L 0.81  FEV1-Predicted Pre % 24  FEV1-Post L 0.86  DLCO uncorrected ml/min/mmHg 14.61  DLCO UNC% % 50  DLCO corrected ml/min/mmHg 14.61  DLCO COR %Predicted % 50  DLVA Predicted % 72  TLC L 7.93  TLC % Predicted % 110  RV % Predicted % 220    No results found for: NITRICOXIDE      Assessment & Plan:   Chronic obstructive pulmonary disease (HCC) Severe GOLD D COPD prone to frequent flares , high risk for decompensation  Continue on triple inhaler/ low dose steroids. , nebs .  Daliresp pending - patient assistance pending .  Cont on O2  Cont w/ home exercises. Consider pulm rehab going forward  Needs Alpha one testing .   Plan  There are no Patient Instructions on file for this visit.    Obstructive sleep apnea Improved CPAP compliance , excellent control  Plan  There are no Patient Instructions  on file for this visit.    Chronic respiratory failure with hypoxia (HCC) Compensated on O2 .   Chronic obstructive pulmonary disease with (acute) exacerbation (HCC) Recurrent COPD exacerbations with multiple hospitalizations.  Patient has very severe COPD has high symptom burden which is oxygen dependent.  He is on multiple maintenance medications including triple inhaler therapy.  He has been continued on low-dose steroids.  Has been recommended on Daliresp but insurance declines.  Patient assistance has been submitted. He is on nocturnal CPAP. Now enrolled in palliative care Continue aggressive pulmonary hygiene regimen.  Plan  Patient Instructions  Continue on TRELEGY 1 puff daily, rinse after use.  Begin Daliresp daily  When available  Mucinex DM Twice daily  As needed  Cough/congestion  Flutter valve Three times a day   Continue on Duoneb nebs Three times a day   Continue on Oxygen 2l/m rest and 3l/m walking .  Continue on Prednisone to 30m daily  Continue on CPAP At bedtime  . -need to wear all night long and naps.  Use caution with sedating medications  Labs today  Continue with Palliative care .  Follow up with Dr. BLamonte Sakaior Amillion Macchia NP in 6 weeks  and As needed  Please contact office for sooner follow up if symptoms do not improve or worsen or seek emergency care       Acute on chronic combined systolic (congestive) and diastolic (congestive) heart failure ([place]) Recurrent admissions patient is continue on current regimen.  Continue follow-up with cardiology     Rexene Edison, NP 12/07/2020

## 2020-12-07 NOTE — Patient Instructions (Addendum)
Continue on TRELEGY 1 puff daily, rinse after use.  Begin Daliresp daily  When available  Mucinex DM Twice daily  As needed  Cough/congestion  Flutter valve Three times a day   Continue on Duoneb nebs Three times a day   Continue on Oxygen 2l/m rest and 3l/m walking .  Continue on Prednisone to 10mg  daily  Continue on CPAP At bedtime  . -need to wear all night long and naps.  Use caution with sedating medications  Labs today  Continue with Palliative care .  Follow up with Dr. or Jenniferlynn Saad NP in 6 weeks  and As needed  Please contact office for sooner follow up if symptoms do not improve or worsen or seek emergency care

## 2020-12-07 NOTE — Assessment & Plan Note (Signed)
Noted oral candidiasis on exam.  Patient is advised on correct inhaler use. We will add in Mycelex troche  for [redacted] week along with nystatin rinse.  Patient is unable to take Diflucan due to multiple drug interactions.

## 2020-12-07 NOTE — Assessment & Plan Note (Addendum)
Improved CPAP compliance , excellent control  Plan  Patient Instructions  Continue on TRELEGY 1 puff daily, rinse after use.  Begin Daliresp daily  When available  Mucinex DM Twice daily  As needed  Cough/congestion  Flutter valve Three times a day   Continue on Duoneb nebs Three times a day   Continue on Oxygen 2l/m rest and 3l/m walking .  Continue on Prednisone to 10mg  daily  Continue on CPAP At bedtime  . -need to wear all night long and naps.  Use caution with sedating medications  Labs today  Continue with Palliative care .  Follow up with Dr. or Jaylin Benzel NP in 6 weeks  and As needed  Please contact office for sooner follow up if symptoms do not improve or worsen or seek emergency care

## 2020-12-10 ENCOUNTER — Telehealth: Payer: Self-pay | Admitting: Family Medicine

## 2020-12-10 NOTE — Telephone Encounter (Signed)
Please advise on message below. Thanks. Dm/cma  

## 2020-12-10 NOTE — Telephone Encounter (Signed)
Kenneth [name] is returning Denises call. Her call back number is [phone]

## 2020-12-10 NOTE — Telephone Encounter (Signed)
Kenneth [name] notified VIA phone of verbal recommendations.  She will call him to advise. Dm/cma

## 2020-12-10 NOTE — Telephone Encounter (Signed)
lft VM to rtn call. Dm/cma  

## 2020-12-10 NOTE — Telephone Encounter (Signed)
Raynelle Fanning from [facility] is calling in stating the pt is coughing, has congestion, diminished lung sounds. His weight has dropped from 185 on 11/30/20 to 190 on 12/10/20. He is taking Mucinex twice a day and Lasix 40mg s twice a day. She is wondering if his Lasix can be increased for a couple of days. Please advise

## 2020-12-11 ENCOUNTER — Telehealth: Payer: Medicare Other

## 2020-12-11 DIAGNOSIS — G4733 Obstructive sleep apnea (adult) (pediatric): Secondary | ICD-10-CM | POA: Diagnosis not present

## 2020-12-11 NOTE — Progress Notes (Deleted)
Chronic Care Management Pharmacy Note  12/11/2020 Name:  Kenneth [name] MRN:  [id number] DOB:  Jun 02, 1961  Subjective: Kenneth [name] is an 60 y.o. year old male who is a primary patient of [name], [name], MD.  The CCM team was consulted for assistance with disease management and care coordination needs.    Engaged with patient by telephone for follow up visit in response to provider referral for pharmacy case management and/or care coordination services.   Consent to Services:  The patient was given information about Chronic Care Management services, agreed to services, and gave verbal consent prior to initiation of services.  Please see initial visit note for detailed documentation.   Patient Care Team: [name], MD as PCP - General (Family Medicine) Buford Dresser, MD as PCP - Cardiology (Cardiology) Germaine Pomfret, [facility] as Pharmacist (Pharmacist) Madison Hickman, NP as Nurse Practitioner (Psychiatry) Dannielle Karvonen, RN as Case Manager [place], [place] as Registered Nurse [facility] [place]) Deirdre Peer, LCSW as Social Worker (Licensed Clinical Social Worker)  Recent office visits: 12/05/20: Patient presented to Dr. Gena Fray for follow-up. Lantus increased to 45 units daily.  11/22/20: Patient presented to Dr. Gena Fray for follow-up. Gabapentin decreased to 200 mg TID.   Recent consult visits: 12/07/20: Patient presented to Rexene Edison, NP (Pulmonology) for follow-up. Clotrimazole, nystatin started for oral candidiasis.    Hospital visits: {Hospital DC Yes/No:25215}  Objective:  Lab Results  Component Value Date   CREATININE 1.51 (H) 10/30/2020   BUN 16 10/30/2020   GFR 50.31 (L) 10/30/2020   GFRNONAA >60 10/24/2020   GFRAA 85 07/05/2020   NA 132 (L) 10/30/2020   K 4.9 10/30/2020   CALCIUM 9.7 10/30/2020   CO2 40 (H) 10/30/2020   GLUCOSE 324 (H) 10/30/2020    Lab Results  Component Value Date/Time   HGBA1C 6.3 (H) 09/13/2020  04:08 AM   HGBA1C 6.8 (H) 06/21/2020 09:14 AM   GFR 50.31 (L) 10/30/2020 08:00 AM   GFR 44.62 (L) 09/25/2020 11:54 AM   MICROALBUR <0.7 07/06/2018 01:55 PM    Last diabetic Eye exam: No results found for: HMDIABEYEEXA  Last diabetic Foot exam: No results found for: HMDIABFOOTEX   Lab Results  Component Value Date   CHOL 226 (H) 07/06/2018   HDL 68.40 07/06/2018   LDLCALC 119 (H) 07/06/2018   TRIG 275 (H) 10/05/2020   CHOLHDL 3 07/06/2018    Hepatic Function Latest Ref Rng & Units 10/23/2020 10/22/2020 10/05/2020  Total Protein 6.5 - 8.1 g/dL 6.2(L) 5.9(L) 5.2(L)  Albumin 3.5 - 5.0 g/dL 3.4(L) 3.4(L) 2.7(L)  AST 15 - 41 U/L 32 45(H) 24  ALT 0 - 44 U/L 34 34 34  Alk Phosphatase 38 - 126 U/L 138(H) 127(H) 58  Total Bilirubin 0.3 - 1.2 mg/dL 0.8 0.6 0.7  Bilirubin, Direct 0.0 - 0.2 mg/dL - - -    Lab Results  Component Value Date/Time   TSH 4.32 07/06/2018 01:55 PM    CBC Latest Ref Rng & Units 10/30/2020 10/24/2020 10/23/2020  WBC 4.0 - 10.5 K/uL 9.6 11.8(H) 11.6(H)  Hemoglobin 13.0 - 17.0 g/dL 11.0(L) 9.4(L) 9.2(L)  Hematocrit 39.0 - 52.0 % 34.2(L) 30.0(L) 31.6(L)  Platelets 150.0 - 400.0 K/uL 240.0 260 262    No results found for: VD25OH  Clinical ASCVD: {YES/NO:21197} The 10-year ASCVD risk score Mikey Bussing DC Jr., et al., 2013) is: 44.1%   Values used to calculate the score:     Age: 45 years  Sex: Male     Is Non-Hispanic African American: Yes     Diabetic: Yes     Tobacco smoker: Yes     Systolic Blood Pressure: 517 mmHg     Is BP treated: Yes     HDL Cholesterol: 68.4 mg/dL     Total Cholesterol: 226 mg/dL    Depression screen [facility] 2/9 11/14/2020 11/07/2020 09/25/2020  Decreased Interest 1 1 1   Down, Depressed, Hopeless 0 1 0  PHQ - 2 Score 1 2 1   Altered sleeping - 3 -  Tired, decreased energy - 2 -  Change in appetite - 2 -  Feeling bad or failure about yourself  - 1 -  Trouble concentrating - 1 -  Moving slowly or fidgety/restless - 1 -  Suicidal thoughts - 0 -   PHQ-9 Score - 12 -  Difficult doing work/chores - Somewhat difficult -  Some recent data might be hidden     ***Other: (CHADS2VASc if Afib, MMRC or CAT for COPD, ACT, DEXA)  Social History   Tobacco Use  Smoking Status Former Smoker  . Packs/day: 2.00  . Years: 30.00  . Pack years: 60.00  . Types: Cigarettes  . Quit date: 05/01/2019  . Years since quitting: 1.6  Smokeless Tobacco Never Used  Tobacco Comment   smokes 2-3cigs per day   BP Readings from Last 3 Encounters:  12/07/20 (!) 150/80  12/05/20 (!) 142/76  11/22/20 (!) 150/74   Pulse Readings from Last 3 Encounters:  12/07/20 60  12/05/20 67  11/22/20 74   Wt Readings from Last 3 Encounters:  12/07/20 189 lb (85.7 kg)  12/05/20 188 lb (85.3 kg)  11/22/20 179 lb 3.2 oz (81.3 kg)   BMI Readings from Last 3 Encounters:  12/07/20 25.63 kg/m  12/05/20 25.50 kg/m  11/22/20 24.30 kg/m    Assessment/Interventions: Review of patient past medical history, allergies, medications, health status, including review of consultants reports, laboratory and other test data, was performed as part of comprehensive evaluation and provision of chronic care management services.   SDOH:  (Social Determinants of Health) assessments and interventions performed: {yes/no:20286}   CCM Care Plan  Allergies  Allergen Reactions  . Lisinopril Swelling  . Other Other (See Comments)    Lettuce : rash  . Tomato Rash    Medications Reviewed Today    Reviewed by Melvenia Needles, NP (Nurse Practitioner) on 12/07/20 at 11  Med List Status: <None>  Medication Order Taking? Sig Documenting Provider Last Dose Status Informant  acetaminophen (TYLENOL) 325 MG tablet [id number] Yes Take 650 mg by mouth every 6 (six) hours as needed for mild pain or headache. [provider] Taking Active Self  albuterol (PROVENTIL) (2.5 MG/3ML) 0.083% nebulizer solution [id number] Yes INHALE 1 VIAL VIA NEBULIZER 5 TIMES DAILY AS NEEDED FOR WHEEZING OR  FOR SHORTNESS OF BREATH  Patient taking differently: Take 2.5 mg by nebulization every 6 (six) hours as needed for shortness of breath.   Debbe Odea, MD Taking Active   amLODipine (NORVASC) 5 MG tablet [id number] Yes Take 5 mg by mouth every morning. [provider] Taking Active   ARIPiprazole (ABILIFY) 5 MG tablet [id number] Yes Take 5 mg by mouth daily. [provider] Taking Active Self           Med Note Raeford Razor Oct 29, 2020  8:13 AM) Prescribed by  Madison Hickman, NP   Blood Glucose Monitoring Suppl (ONE TOUCH ULTRA 2) w/Device  KIT [id number] Yes Use to test blood sugars 1-2 times daily. [name], MD Taking Active Self  busPIRone (BUSPAR) 15 MG tablet [id number] Yes Take 1 tablet (15 mg total) by mouth 2 (two) times daily. [name], MD Taking Active Self           Med Note Michaelle Birks, Alda Lea Oct 29, 2020  8:13 AM) Prescribed by  Madison Hickman, NP    carvedilol (COREG) 12.5 MG tablet [id number] Yes Take 1 tablet (12.5 mg total) by mouth 2 (two) times daily with a meal. Buford Dresser, MD Taking Active Self           Med Note ([place], Ned Clines E   Tue Oct 23, 2020  1:21 PM)    DM-GG & DM-APAP-CPM (CORICIDIN HBP DAY/NIGHT COLD) 10-20 &15-200-2 MG MISC [id number] Yes Take 1 tablet by mouth every 12 (twelve) hours as needed (cold symptoms). [provider] Taking Active Self  ELIQUIS 5 MG TABS tablet [id number] Yes TAKE ONE TABLET BY MOUTH EVERY MORNING and TAKE ONE TABLET BY MOUTH EVERY Janace Litten, MD Taking Active   famotidine (PEPCID) 20 MG tablet [id number] Yes Take 20 mg by mouth daily. [provider] Taking Active Self  FLUoxetine (PROZAC) 20 MG capsule [id number] Yes TAKE 3 CAPSULES BY MOUTH  DAILY [name], MD Taking Active            Med Note Michaelle Birks, [facility] A   Mon Oct 29, 2020  8:15 AM) Prescribed by  Madison Hickman, NP    Fluticasone-Umeclidin-Vilant (TRELEGY ELLIPTA) 100-62.5-25 MCG/INH AEPB [id number] Yes Inhale 1 puff into the lungs daily. Collene Gobble, MD Taking Active Self           Med Note ([place], [place] Oct 23, 2020  1:23 PM)    furosemide (LASIX) 40 MG tablet [id number] Yes Take 1 tablet (40 mg total) by mouth 2 (two) times daily. Buford Dresser, MD Taking Active Self           Med Note ([place], [place] Oct 23, 2020  1:23 PM)    gabapentin (NEURONTIN) 100 MG capsule [id number] Yes Take 2 capsules (200 mg total) by mouth 3 (three) times daily. [name], MD Taking Active   glucose blood [facility] ULTRA) test strip [id number] Yes USE TO TEST BLOOD SUGAR 1-2 TIMES DAILY [name], MD Taking Active Self  guaiFENesin (MUCINEX) 600 MG 12 hr tablet [id number] Yes Take 1 tablet (600 mg total) by mouth 2 (two) times daily. Elmarie Shiley, MD Taking Active Self           Med Note ([place], [place] Oct 23, 2020  1:22 PM)    hydrALAZINE (APRESOLINE) 25 MG tablet [id number] Yes Take 1 tablet (25 mg total) by mouth 3 (three) times daily. [name], MD Taking Active   insulin glargine (LANTUS SOLOSTAR) 100 UNIT/ML Solostar Pen [id number] Yes Inject 45 Units into the skin at bedtime. If fasting blood sugars remain >150 for 1 week, increase insulin dose by 5 units. Maximum of 80 units. [name], MD Taking Active   Insulin Pen Needle (PEN NEEDLES) 31G X 6 MM MISC [id number] Yes 1 application by Does not apply route at bedtime. Nche, Charlene Brooke, NP Taking Active   ipratropium-albuterol (DUONEB) 0.5-2.5 (3) MG/3ML SOLN [id number] Yes Take 3 mLs by nebulization every 6 (six) hours as needed.  Patient taking differently:  Take 3 mLs by nebulization every 6 (six) hours as needed (For shortness of breath).   Elmarie Shiley, MD Taking Active            Med Note ([place], [place] Oct 23, 2020  1:22 PM)    isosorbide mononitrate (IMDUR) 30 MG  24 hr tablet [id number] Yes Take 30 mg by mouth daily. [provider] Taking Active Self           Med Note ([place], [place] Oct 23, 2020  1:24 PM)    Lancets [facility] ULTRASOFT) lancets [id number] Yes USE TO TEST BLOOD SUGAR 1-2 TIMES DAILY [name], MD Taking Active   lubiprostone [facility]) 8 MCG capsule [id number] Yes Take 1 capsule (8 mcg total) by mouth 2 (two) times daily with a meal. [name], MD Taking Active   Multiple Vitamin (MULTIVITAMIN WITH MINERALS) TABS tablet [id number] Yes Take 1 tablet by mouth daily. [provider] Taking Active Self  nitroGLYCERIN (NITROSTAT) 0.4 MG SL tablet [id number] Yes DISSOLVE 1 TABLET UNDER THE TONGUE EVERY 5 MINUTES AS&nbsp;&nbsp;NEEDED FOR CHEST PAIN. MAX&nbsp;&nbsp;OF 3 TABLETS IN 15 MINUTES. CALL 911 IF PAIN PERSISTS.  Patient taking differently: Place 0.4 mg under the tongue every 5 (five) minutes as needed for chest pain.   [name], MD Taking Active            Med Note Alvino Chapel, [place] Left Oct 04, 2020  1:52 PM) Last filled 07/25/20  pantoprazole (PROTONIX) 40 MG tablet [id number] Yes TAKE ONE TABLET BY MOUTH EVERY MORNING [place], Amy S, PA-C Taking Active   potassium chloride SA (KLOR-CON) 20 MEQ tablet [id number] Yes Take 2 tablets (40 mEq total) by mouth daily. [name], MD Taking Active   predniSONE (DELTASONE) 10 MG tablet [id number] Yes Take 1 tablet (10 mg total) by mouth daily with breakfast. Parrett, Fonnie Mu, NP Taking Active Self  Roflumilast (DALIRESP) 250 MCG TABS [id number] No Take 250 mcg by mouth daily.  Patient not taking: Reported on 12/07/2020   Melvenia Needles, NP Not Taking Active   rosuvastatin (CRESTOR) 20 MG tablet [id number] Yes TAKE ONE TABLET BY MOUTH ONCE DAILY Dutch Quint B, FNP Taking Active   spironolactone (ALDACTONE) 25 MG tablet [id number]  Take 1 tablet (25 mg total) by mouth daily. Barb Merino, MD  Expired 11/23/20 2359            Patient Active Problem List   Diagnosis Date Noted  . Chronic respiratory failure with hypoxia ([place]) 12/07/2020  . Oral candidiasis 12/07/2020  . Acute on chronic respiratory failure with hypoxia ([place]) 10/23/2020  . Cardiac arrest ([place]) 10/04/2020  . Acute kidney injury superimposed on CKD ([place]) 09/23/2020  . Thiamine deficiency 07/02/2020  . Obstructive sleep apnea 05/02/2020  . Hyperlipidemia   . Acute on chronic combined systolic (congestive) and diastolic (congestive) heart failure ([place]) 04/07/2020  . Chronic constipation 07/19/2019  . Snoring 03/29/2019  . Insomnia due to other mental disorder 11/15/2018  . Anticoagulant long-term use 10/21/2018  . Gastroesophageal reflux disease without esophagitis 08/10/2018  . Tobacco abuse 08/10/2018  . Chest pain 07/27/2018  . Essential hypertension 07/06/2018  . Chronic obstructive pulmonary disease ([place]) 07/06/2018  . Alcohol abuse 07/06/2018  . Paroxysmal atrial fibrillation ([place]) 11/28/2017  . Slow transit constipation 06/07/2016  . Chronic anemia 06/06/2016  . Hypertensive kidney disease with chronic kidney disease stage III ([place]) 06/06/2016  . Mixed anxiety and  depressive disorder 06/05/2016  . Type 2 diabetes mellitus with diabetic neuropathy, with long-term current use of insulin ([place]) 06/05/2016  . Chronic obstructive pulmonary disease with (acute) exacerbation ([place]) 06/05/2016    Immunization History  Administered Date(s) Administered  . Influenza,inj,Quad PF,6+ Mos 06/25/2018, 05/20/2019  . Influenza-Unspecified 06/28/2020  . PFIZER(Purple Top)SARS-COV-2 Vaccination 12/22/2019, 01/16/2020, 10/29/2020  . Pneumococcal Polysaccharide-23 06/08/2016  . Zoster Recombinat (Shingrix) 07/22/2019    Conditions to be addressed/monitored:  {USCCMDZASSESSMENTOPTIONS:23563}  There are no care plans that you recently modified to display for this patient.    Medication Assistance: {MEDASSISTANCEINFO:25044}  Patient's  preferred pharmacy is:  [facility] - [place], [place] - [address] [place] [address] Phone: [phone] Fax: [phone]  Uses pill box? {Yes or If no, why not?:20788} Pt endorses ***% compliance  We discussed: {Pharmacy options:24294} Patient decided to: {US Pharmacy Plan:23885}  Care Plan and Follow Up Patient Decision:  {FOLLOWUP:24991}  Plan: {CM FOLLOW UP PLAN:25073}  ***   Current Barriers:  . {pharmacybarriers:24917} . ***  Pharmacist Clinical Goal(s):  [name] Patient will {PHARMACYGOALCHOICES:24921} through collaboration with PharmD and provider.  . ***  Interventions: . 1:1 collaboration with [name], MD regarding development and update of comprehensive plan of care as evidenced by provider attestation and co-signature . Inter-disciplinary care team collaboration (see longitudinal plan of care) . Comprehensive medication review performed; medication list updated in electronic medical record  Hyperlipidemia: (LDL goal < ***) -{US controlled/uncontrolled:25276} -Current treatment: . *** -Medications previously tried: ***  -Current dietary patterns: *** -Current exercise habits: *** -Educated on {CCM HLD Counseling:25126} -{CCMPHARMDINTERVENTION:25122}  Diabetes (A1c goal <7%) -Not ideally controlled -Current medications: . Lantus 45 units nightly  -Medications previously tried: Metformin (stopped during hospitalization   -Current home glucose readings . fasting glucose: *** . post prandial glucose: *** -{ACTIONS;DENIES/REPORTS:21021675::"Denies"} hypoglycemic/hyperglycemic symptoms -Current meal patterns:  . breakfast: ***  . lunch: ***  . dinner: *** . snacks: *** . drinks: *** -Current exercise: *** -Educated on {CCM DM COUNSELING:25123} -Counseled to check feet daily and get yearly eye exams -{CCMPHARMDINTERVENTION:25122}  Heart Failure (Goal: manage symptoms and prevent  exacerbations) -{US controlled/uncontrolled:25276} -Last ejection fraction: *** (Date: ***) -HF type: {type of heart failure:30421350} -NYHA Class: {CHL HP [facility] NYHA Class:8782485035} -AHA HF Stage: {CHL HP [facility] AHA HF Stage:[phone]} -Current treatment: . *** -Medications previously tried: ***  -Current home BP/HR readings: *** -Current dietary habits: *** -Current exercise habits: *** -Educated on {CCM HF Counseling:25125} -{CCMPHARMDINTERVENTION:25122}  COPD (Goal: control symptoms and prevent exacerbations) -{US controlled/uncontrolled:25276} -Current treatment  . *** -Medications previously tried: ***  -Gold Grade: {CHL HP [facility] COPD Gold ZHYQM:[id number]} -Current COPD Classification:  D (high sx, >/=2 exacerbations/yr) -MMRC/CAT score: *** -Pulmonary function testing: *** -Exacerbations requiring treatment in last 6 months: *** -Patient {Actions; denies-reports:120008} consistent use of maintenance inhaler -Frequency of rescue inhaler use: *** -Counseled on {CCMINHALERCOUNSELING:25121} -{CCMPHARMDINTERVENTION:25122}   Patient Goals/Self-Care Activities . Patient will:  - {pharmacypatientgoals:24919}  Follow Up Plan: {CM FOLLOW UP MWUX:32440}

## 2020-12-12 ENCOUNTER — Ambulatory Visit: Payer: Self-pay

## 2020-12-12 ENCOUNTER — Ambulatory Visit: Payer: Medicare Other

## 2020-12-12 ENCOUNTER — Telehealth: Payer: Self-pay | Admitting: Family Medicine

## 2020-12-12 ENCOUNTER — Telehealth: Payer: Self-pay

## 2020-12-12 DIAGNOSIS — Z794 Long term (current) use of insulin: Secondary | ICD-10-CM

## 2020-12-12 DIAGNOSIS — J449 Chronic obstructive pulmonary disease, unspecified: Secondary | ICD-10-CM | POA: Diagnosis not present

## 2020-12-12 DIAGNOSIS — J441 Chronic obstructive pulmonary disease with (acute) exacerbation: Secondary | ICD-10-CM | POA: Diagnosis not present

## 2020-12-12 DIAGNOSIS — I5043 Acute on chronic combined systolic (congestive) and diastolic (congestive) heart failure: Secondary | ICD-10-CM | POA: Diagnosis not present

## 2020-12-12 DIAGNOSIS — I152 Hypertension secondary to endocrine disorders: Secondary | ICD-10-CM | POA: Diagnosis not present

## 2020-12-12 DIAGNOSIS — E1140 Type 2 diabetes mellitus with diabetic neuropathy, unspecified: Secondary | ICD-10-CM

## 2020-12-12 DIAGNOSIS — F418 Other specified anxiety disorders: Secondary | ICD-10-CM

## 2020-12-12 DIAGNOSIS — E1159 Type 2 diabetes mellitus with other circulatory complications: Secondary | ICD-10-CM

## 2020-12-12 DIAGNOSIS — I10 Essential (primary) hypertension: Secondary | ICD-10-CM

## 2020-12-12 DIAGNOSIS — E119 Type 2 diabetes mellitus without complications: Secondary | ICD-10-CM | POA: Diagnosis not present

## 2020-12-12 NOTE — Progress Notes (Signed)
Chronic Care Management Pharmacy Assistant   Name: Kenneth [name]  MRN: [id number] DOB: 1961-03-29   Reason for Encounter: Medication Review/Patient Assistance for [place].     Medications: Outpatient Encounter Medications as of 12/12/2020  Medication Sig Note  . acetaminophen (TYLENOL) 325 MG tablet Take 650 mg by mouth every 6 (six) hours as needed for mild pain or headache.   . albuterol (PROVENTIL) (2.5 MG/3ML) 0.083% nebulizer solution INHALE 1 VIAL VIA NEBULIZER 5 TIMES DAILY AS NEEDED FOR WHEEZING OR FOR SHORTNESS OF BREATH (Patient taking differently: Take 2.5 mg by nebulization every 6 (six) hours as needed for shortness of breath.)   . amLODipine (NORVASC) 5 MG tablet Take 5 mg by mouth every morning.   . ARIPiprazole (ABILIFY) 5 MG tablet Take 5 mg by mouth daily. 10/29/2020: Prescribed by  Madison Hickman, NP   . Blood Glucose Monitoring Suppl (ONE TOUCH ULTRA 2) w/Device KIT Use to test blood sugars 1-2 times daily.   . busPIRone (BUSPAR) 15 MG tablet Take 1 tablet (15 mg total) by mouth 2 (two) times daily. 10/29/2020: Prescribed by  Madison Hickman, NP    . carvedilol (COREG) 12.5 MG tablet Take 1 tablet (12.5 mg total) by mouth 2 (two) times daily with a meal.   . clotrimazole (MYCELEX) 10 MG troche Take 1 tablet (10 mg total) by mouth 5 (five) times daily.   [name] DM-GG & DM-APAP-CPM (CORICIDIN HBP DAY/NIGHT COLD) 10-20 &15-200-2 MG MISC Take 1 tablet by mouth every 12 (twelve) hours as needed (cold symptoms).   [name] ELIQUIS 5 MG TABS tablet TAKE ONE TABLET BY MOUTH EVERY MORNING and TAKE ONE TABLET BY MOUTH EVERY EVENING   . famotidine (PEPCID) 20 MG tablet Take 20 mg by mouth daily.   [name] FLUoxetine (PROZAC) 20 MG capsule TAKE 3 CAPSULES BY MOUTH  DAILY 10/29/2020: Prescribed by  Madison Hickman, NP   . Fluticasone-Umeclidin-Vilant (TRELEGY ELLIPTA) 100-62.5-25 MCG/INH AEPB Inhale 1 puff into the lungs daily.   . furosemide (LASIX) 40 MG tablet Take 1 tablet (40 mg total) by  mouth 2 (two) times daily.   [name] gabapentin (NEURONTIN) 100 MG capsule Take 2 capsules (200 mg total) by mouth 3 (three) times daily.   [name] glucose blood (ONETOUCH ULTRA) test strip USE TO TEST BLOOD SUGAR 1-2 TIMES DAILY   . guaiFENesin (MUCINEX) 600 MG 12 hr tablet Take 1 tablet (600 mg total) by mouth 2 (two) times daily.   . hydrALAZINE (APRESOLINE) 25 MG tablet Take 1 tablet (25 mg total) by mouth 3 (three) times daily.   . insulin glargine (LANTUS SOLOSTAR) 100 UNIT/ML Solostar Pen Inject 45 Units into the skin at bedtime. If fasting blood sugars remain >150 for 1 week, increase insulin dose by 5 units. Maximum of 80 units.   . Insulin Pen Needle (PEN NEEDLES) 31G X 6 MM MISC 1 application by Does not apply route at bedtime.   [name] ipratropium-albuterol (DUONEB) 0.5-2.5 (3) MG/3ML SOLN Take 3 mLs by nebulization every 6 (six) hours as needed. (Patient taking differently: Take 3 mLs by nebulization every 6 (six) hours as needed (For shortness of breath).)   . isosorbide mononitrate (IMDUR) 30 MG 24 hr tablet Take 30 mg by mouth daily.   . Lancets (ONETOUCH ULTRASOFT) lancets USE TO TEST BLOOD SUGAR 1-2 TIMES DAILY   . lubiprostone ([name]) 8 MCG capsule Take 1 capsule (8 mcg total) by mouth 2 (two) times daily with a meal.   . Multiple Vitamin (MULTIVITAMIN WITH  MINERALS) TABS tablet Take 1 tablet by mouth daily.   . nitroGLYCERIN (NITROSTAT) 0.4 MG SL tablet DISSOLVE 1 TABLET UNDER THE TONGUE EVERY 5 MINUTES AS&nbsp;&nbsp;NEEDED FOR CHEST PAIN. MAX&nbsp;&nbsp;OF 3 TABLETS IN 15 MINUTES. CALL 911 IF PAIN PERSISTS. (Patient taking differently: Place 0.4 mg under the tongue every 5 (five) minutes as needed for chest pain.) 10/04/2020: Last filled 07/25/20  . nystatin (MYCOSTATIN) 100000 UNIT/ML suspension Take 5 mLs (500,000 Units total) by mouth 4 (four) times daily.   . pantoprazole (PROTONIX) 40 MG tablet TAKE ONE TABLET BY MOUTH EVERY MORNING   . potassium chloride SA (KLOR-CON) 20 MEQ tablet Take 2  tablets (40 mEq total) by mouth daily.   . predniSONE (DELTASONE) 10 MG tablet Take 1 tablet (10 mg total) by mouth daily with breakfast.   . Roflumilast (DALIRESP) 250 MCG TABS Take 250 mcg by mouth daily. (Patient not taking: Reported on 12/07/2020)   . rosuvastatin (CRESTOR) 20 MG tablet TAKE ONE TABLET BY MOUTH ONCE DAILY   . spironolactone (ALDACTONE) 25 MG tablet Take 1 tablet (25 mg total) by mouth daily.    No facility-administered encounter medications on file as of 12/12/2020.    Patient states  his blood sugar last night was 535 he called the paramedics ,and they ask  him why he was not taking metformin anymore , he told them his PCP stopped it , according to the note on  12/05/2020 He was seen on 2/23 due to a recent change in his medication that had contributed to hyperglycemia. He was in the hospital several weeks prior to that for an exacerbation of heart failure. At discharge, his metformin had been stopped (presumably due to its negative impact on CHF) and he had been started on Lantus 10 units qhs for diabetes management.Patient is now currently taking 45 units of Lantus Daily at bedtime. Patient reports he has not slept ,he has been very irritable ,anxious fatigue and heart rate has increase.Notifed Clinical Pharmacist.   Patient is asking for a update on his [facility] patient assistance application.Spoke with [employer] , they will process a delivery for [facility] will take between 12 to 14 business days for the delivery.Notifed Clinical Pharmacist.  Bessie [place] Pharmacist Assistant [phone]

## 2020-12-12 NOTE — Telephone Encounter (Signed)
[place]-Case Manager [phone]) is calling to speak to the nurse regarding patient. Please give her a call back.

## 2020-12-12 NOTE — Telephone Encounter (Signed)
Patient is returning call to the office. Please call back. 

## 2020-12-12 NOTE — Telephone Encounter (Signed)
Spoke to patient.  See message in chart to Dr Veto Kemps. Dm/cma

## 2020-12-12 NOTE — Progress Notes (Signed)
Chronic Care Management Pharmacy Note  12/12/2020 Name:  Kenneth [name] MRN:  [id number] DOB:  1961-06-30  Subjective: Kenneth [name] is an 60 y.o. year old male who is a primary patient of [name], [name], MD.  The CCM team was consulted for assistance with disease management and care coordination needs.    Engaged with patient by telephone for follow up visit in response to provider referral for pharmacy case management and/or care coordination services.   Consent to Services:  The patient was given information about Chronic Care Management services, agreed to services, and gave verbal consent prior to initiation of services.  Please see initial visit note for detailed documentation.   Patient Care Team: [name], MD as PCP - General (Family Medicine) Buford Dresser, MD as PCP - Cardiology (Cardiology) Germaine Pomfret, [facility] as Pharmacist (Pharmacist) Madison Hickman, NP as Nurse Practitioner (Psychiatry) Dannielle Karvonen, RN as Case Manager [facility] as Registered Nurse [facility] and Palliative Medicine) Deirdre Peer, LCSW as Social Worker (Licensed Clinical Social Worker)  Objective:  Lab Results  Component Value Date   CREATININE 1.51 (H) 10/30/2020   BUN 16 10/30/2020   GFR 50.31 (L) 10/30/2020   GFRNONAA >60 10/24/2020   GFRAA 85 07/05/2020   NA 132 (L) 10/30/2020   K 4.9 10/30/2020   CALCIUM 9.7 10/30/2020   CO2 40 (H) 10/30/2020   GLUCOSE 324 (H) 10/30/2020    Lab Results  Component Value Date/Time   HGBA1C 6.3 (H) 09/13/2020 04:08 AM   HGBA1C 6.8 (H) 06/21/2020 09:14 AM   GFR 50.31 (L) 10/30/2020 08:00 AM   GFR 44.62 (L) 09/25/2020 11:54 AM   MICROALBUR <0.7 07/06/2018 01:55 PM    Last diabetic Eye exam: No results found for: HMDIABEYEEXA  Last diabetic Foot exam: No results found for: HMDIABFOOTEX   Lab Results  Component Value Date   CHOL 226 (H) 07/06/2018   HDL 68.40 07/06/2018   LDLCALC 119 (H) 07/06/2018   TRIG  275 (H) 10/05/2020   CHOLHDL 3 07/06/2018    Hepatic Function Latest Ref Rng & Units 10/23/2020 10/22/2020 10/05/2020  Total Protein 6.5 - 8.1 g/dL 6.2(L) 5.9(L) 5.2(L)  Albumin 3.5 - 5.0 g/dL 3.4(L) 3.4(L) 2.7(L)  AST 15 - 41 U/L 32 45(H) 24  ALT 0 - 44 U/L 34 34 34  Alk Phosphatase 38 - 126 U/L 138(H) 127(H) 58  Total Bilirubin 0.3 - 1.2 mg/dL 0.8 0.6 0.7  Bilirubin, Direct 0.0 - 0.2 mg/dL - - -    Lab Results  Component Value Date/Time   TSH 4.32 07/06/2018 01:55 PM    CBC Latest Ref Rng & Units 10/30/2020 10/24/2020 10/23/2020  WBC 4.0 - 10.5 K/uL 9.6 11.8(H) 11.6(H)  Hemoglobin 13.0 - 17.0 g/dL 11.0(L) 9.4(L) 9.2(L)  Hematocrit 39.0 - 52.0 % 34.2(L) 30.0(L) 31.6(L)  Platelets 150.0 - 400.0 K/uL 240.0 260 262    No results found for: VD25OH  Clinical ASCVD: No  The 10-year ASCVD risk score Mikey Bussing DC Jr., et al., 2013) is: 44.1%   Values used to calculate the score:     Age: 47 years     Sex: Male     Is Non-Hispanic African American: Yes     Diabetic: Yes     Tobacco smoker: Yes     Systolic Blood Pressure: 383 mmHg     Is BP treated: Yes     HDL Cholesterol: 68.4 mg/dL     Total Cholesterol: 226 mg/dL  Depression screen [facility] 2/9 11/14/2020 11/07/2020 09/25/2020  Decreased Interest 1 1 1   Down, Depressed, Hopeless 0 1 0  PHQ - 2 Score 1 2 1   Altered sleeping - 3 -  Tired, decreased energy - 2 -  Change in appetite - 2 -  Feeling bad or failure about yourself  - 1 -  Trouble concentrating - 1 -  Moving slowly or fidgety/restless - 1 -  Suicidal thoughts - 0 -  PHQ-9 Score - 12 -  Difficult doing work/chores - Somewhat difficult -  Some recent data might be hidden    Social History   Tobacco Use  Smoking Status Former Smoker  . Packs/day: 2.00  . Years: 30.00  . Pack years: 60.00  . Types: Cigarettes  . Quit date: 05/01/2019  . Years since quitting: 1.6  Smokeless Tobacco Never Used  Tobacco Comment   smokes 2-3cigs per day   BP Readings from Last 3 Encounters:   12/07/20 (!) 150/80  12/05/20 (!) 142/76  11/22/20 (!) 150/74   Pulse Readings from Last 3 Encounters:  12/07/20 60  12/05/20 67  11/22/20 74   Wt Readings from Last 3 Encounters:  12/07/20 189 lb (85.7 kg)  12/05/20 188 lb (85.3 kg)  11/22/20 179 lb 3.2 oz (81.3 kg)   BMI Readings from Last 3 Encounters:  12/07/20 25.63 kg/m  12/05/20 25.50 kg/m  11/22/20 24.30 kg/m    Assessment/Interventions: Review of patient past medical history, allergies, medications, health status, including review of consultants reports, laboratory and other test data, was performed as part of comprehensive evaluation and provision of chronic care management services.   SDOH:  (Social Determinants of Health) assessments and interventions performed: Yes   CCM Care Plan  Allergies  Allergen Reactions  . Lisinopril Swelling  . Other Other (See Comments)    Lettuce : rash  . Tomato Rash    Medications Reviewed Today    Reviewed by Dannielle Karvonen, RN (Registered Nurse) on 12/12/20 at 1042  Med List Status: <None>  Medication Order Taking? Sig Documenting Provider Last Dose Status Informant  acetaminophen (TYLENOL) 325 MG tablet [id number]  Take 650 mg by mouth every 6 (six) hours as needed for mild pain or headache. [provider]  Active Self  albuterol (PROVENTIL) (2.5 MG/3ML) 0.083% nebulizer solution [id number]  INHALE 1 VIAL VIA NEBULIZER 5 TIMES DAILY AS NEEDED FOR WHEEZING OR FOR SHORTNESS OF BREATH  Patient taking differently: Take 2.5 mg by nebulization every 6 (six) hours as needed for shortness of breath.   Debbe Odea, MD  Active   amLODipine (NORVASC) 5 MG tablet [id number]  Take 5 mg by mouth every morning. [provider]  Active   ARIPiprazole (ABILIFY) 5 MG tablet [id number]  Take 5 mg by mouth daily. [provider]  Active Self           Med Note Michaelle Birks, [place] A   Mon Oct 29, 2020  8:13 AM) Prescribed by  Madison Hickman, NP   Blood Glucose  Monitoring Suppl (ONE TOUCH ULTRA 2) w/Device KIT [id number]  Use to test blood sugars 1-2 times daily. [name], MD  Active Self  busPIRone (BUSPAR) 15 MG tablet [id number]  Take 1 tablet (15 mg total) by mouth 2 (two) times daily. [name], MD  Active Self           Med Note Michaelle Birks, [facility] A   Mon Oct 29, 2020  8:13 AM) Prescribed by  Trinda Pascal  Blankmann, NP    carvedilol (COREG) 12.5 MG tablet [id number]  Take 1 tablet (12.5 mg total) by mouth 2 (two) times daily with a meal. Buford Dresser, MD  Active Self           Med Note ([place], [place] Oct 23, 2020  1:21 PM)    clotrimazole (MYCELEX) 10 MG troche [id number] Yes Take 1 tablet (10 mg total) by mouth 5 (five) times daily. Parrett, Fonnie Mu, NP Taking Active   DM-GG & DM-APAP-CPM (CORICIDIN HBP DAY/NIGHT COLD) 10-20 &15-200-2 MG MISC [id number]  Take 1 tablet by mouth every 12 (twelve) hours as needed (cold symptoms). [provider]  Active Self  ELIQUIS 5 MG TABS tablet [id number]  TAKE ONE TABLET BY MOUTH EVERY MORNING and TAKE ONE TABLET BY MOUTH EVERY Janace Litten, MD  Active   famotidine (PEPCID) 20 MG tablet [id number]  Take 20 mg by mouth daily. [provider]  Active Self  FLUoxetine (PROZAC) 20 MG capsule [id number]  TAKE 3 CAPSULES BY MOUTH  DAILY [name], MD  Active            Med Note Michaelle Birks, Gjon Letarte A   Mon Oct 29, 2020  8:15 AM) Prescribed by  Madison Hickman, NP   Fluticasone-Umeclidin-Vilant (TRELEGY ELLIPTA) 100-62.5-25 MCG/INH AEPB [id number]  Inhale 1 puff into the lungs daily. Collene Gobble, MD  Active Self           Med Note ([place], [place] Oct 23, 2020  1:23 PM)    furosemide (LASIX) 40 MG tablet [id number]  Take 1 tablet (40 mg total) by mouth 2 (two) times daily. Buford Dresser, MD  Active Self           Med Note ([place], [place] Oct 23, 2020  1:23 PM)    gabapentin (NEURONTIN) 100 MG capsule  [id number]  Take 2 capsules (200 mg total) by mouth 3 (three) times daily. [name], MD  Active   glucose blood [facility] ULTRA) test strip [id number]  USE TO TEST BLOOD SUGAR 1-2 TIMES DAILY [name], MD  Active Self  guaiFENesin (MUCINEX) 600 MG 12 hr tablet [id number]  Take 1 tablet (600 mg total) by mouth 2 (two) times daily. Elmarie Shiley, MD  Active Self           Med Note ([place], DARIUS E   Tue Oct 23, 2020  1:22 PM)    hydrALAZINE (APRESOLINE) 25 MG tablet [id number]  Take 1 tablet (25 mg total) by mouth 3 (three) times daily. [name], MD  Active   insulin glargine (LANTUS SOLOSTAR) 100 UNIT/ML Solostar Pen [id number] Yes Inject 45 Units into the skin at bedtime. If fasting blood sugars remain >150 for 1 week, increase insulin dose by 5 units. Maximum of 80 units. [name], MD Taking Active   Insulin Pen Needle (PEN NEEDLES) 31G X 6 MM MISC [id number]  1 application by Does not apply route at bedtime. Nche, Charlene Brooke, NP  Active   ipratropium-albuterol (DUONEB) 0.5-2.5 (3) MG/3ML SOLN [id number]  Take 3 mLs by nebulization every 6 (six) hours as needed.  Patient taking differently: Take 3 mLs by nebulization every 6 (six) hours as needed (For shortness of breath).   Elmarie Shiley, MD  Active            Med Note ([place], [place]   Tue Oct 23, 2020  1:22 PM)    isosorbide mononitrate (IMDUR) 30 MG 24 hr tablet [id number]  Take 30 mg by mouth daily. [provider]  Active Self           Med Note ([place], [place]aks   Tue Oct 23, 2020  1:24 PM)    Lancets [facility] ULTRASOFT) lancets [id number]  USE TO TEST BLOOD SUGAR 1-2 TIMES DAILY [name], MD  Active   lubiprostone [facility]) 8 MCG capsule [id number]  Take 1 capsule (8 mcg total) by mouth 2 (two) times daily with a meal. [name], MD  Active   Multiple Vitamin (MULTIVITAMIN WITH MINERALS) TABS tablet [id number]  Take 1 tablet by mouth daily. [provider]  Active Self  nitroGLYCERIN (NITROSTAT) 0.4 MG SL tablet [id number]  DISSOLVE 1 TABLET UNDER THE TONGUE EVERY 5 MINUTES AS&nbsp;&nbsp;NEEDED FOR CHEST PAIN. MAX&nbsp;&nbsp;OF 3 TABLETS IN 15 MINUTES. CALL 911 IF PAIN PERSISTS.  Patient taking differently: Place 0.4 mg under the tongue every 5 (five) minutes as needed for chest pain.   [name], MD  Active            Med Note Alvino Chapel, [place] Left Oct 04, 2020  1:52 PM) Last filled 07/25/20  nystatin (MYCOSTATIN) 100000 UNIT/ML suspension [id number] Yes Take 5 mLs (500,000 Units total) by mouth 4 (four) times daily. Parrett, Fonnie Mu, NP Taking Active   pantoprazole (PROTONIX) 40 MG tablet [id number]  TAKE ONE TABLET BY MOUTH EVERY MORNING [place], Amy S, PA-C  Active   potassium chloride SA (KLOR-CON) 20 MEQ tablet [id number]  Take 2 tablets (40 mEq total) by mouth daily. [name], MD  Active   predniSONE (DELTASONE) 10 MG tablet [id number]  Take 1 tablet (10 mg total) by mouth daily with breakfast. Parrett, Fonnie Mu, NP  Active Self  Roflumilast (DALIRESP) 250 MCG TABS [id number]  Take 250 mcg by mouth daily.  Patient not taking: Reported on 12/07/2020   Melvenia Needles, NP  Active   rosuvastatin (CRESTOR) 20 MG tablet [id number]  TAKE ONE TABLET BY MOUTH ONCE DAILY Dutch Quint B, FNP  Active   spironolactone (ALDACTONE) 25 MG tablet [id number]  Take 1 tablet (25 mg total) by mouth daily. Barb Merino, MD  Expired 11/23/20 2359           Patient Active Problem List   Diagnosis Date Noted  . Chronic respiratory failure with hypoxia ([place]) 12/07/2020  . Oral candidiasis 12/07/2020  . Acute on chronic respiratory failure with hypoxia ([place]) 10/23/2020  . Cardiac arrest ([place]) 10/04/2020  . Acute kidney injury superimposed on CKD ([place]) 09/23/2020  . Thiamine deficiency 07/02/2020  . Obstructive sleep apnea 05/02/2020  . Hyperlipidemia   . Acute on chronic combined systolic (congestive) and diastolic  (congestive) heart failure ([place]) 04/07/2020  . Chronic constipation 07/19/2019  . Snoring 03/29/2019  . Insomnia due to other mental disorder 11/15/2018  . Anticoagulant long-term use 10/21/2018  . Gastroesophageal reflux disease without esophagitis 08/10/2018  . Tobacco abuse 08/10/2018  . Chest pain 07/27/2018  . Essential hypertension 07/06/2018  . Chronic obstructive pulmonary disease ([place]) 07/06/2018  . Alcohol abuse 07/06/2018  . Paroxysmal atrial fibrillation ([place]) 11/28/2017  . Slow transit constipation 06/07/2016  . Chronic anemia 06/06/2016  . Hypertensive kidney disease with chronic kidney disease stage III ([place]) 06/06/2016  . Mixed anxiety and depressive disorder 06/05/2016  . Type 2 diabetes mellitus with diabetic neuropathy, with long-term current use of insulin ([place]) 06/05/2016  .  Chronic obstructive pulmonary disease with (acute) exacerbation ([place]) 06/05/2016    Immunization History  Administered Date(s) Administered  . Influenza,inj,Quad PF,6+ Mos 06/25/2018, 05/20/2019  . Influenza-Unspecified 06/28/2020  . PFIZER(Purple Top)SARS-COV-2 Vaccination 12/22/2019, 01/16/2020, 10/29/2020  . Pneumococcal Polysaccharide-23 06/08/2016  . Zoster Recombinat (Shingrix) 07/22/2019    Conditions to be addressed/monitored:  Diabetes  There are no care plans that you recently modified to display for this patient.    Medication Assistance: None required.  Patient affirms current coverage meets needs.  Patient's preferred pharmacy is:  [facility] - [place], [place] - [address] [place] [address] Phone: [phone] Fax: [phone]  Care Plan and Follow Up Patient Decision:  Patient agrees to Care Plan and Follow-up.  Plan: Telephone follow up appointment with care management team member scheduled for:  12/25/2020 at 9:00 AM  Junius Argyle, PharmD, CPP Clinical Pharmacist [facility]  [phone]

## 2020-12-12 NOTE — Telephone Encounter (Signed)
Spoke to [place],   Will reach out to him to see how he is today after he called the EMS due to breathing issues and BS above 500. He did do breathing treatments and was better there.  EMS got a BS of 534 and was told to f/u with PCP.   called and left VM to RTN call. Dm/cma

## 2020-12-12 NOTE — Chronic Care Management (AMB) (Signed)
Care Management    RN Visit Note  12/12/2020 Name: Kenneth [name] MRN: [id number] DOB: 04/10/1961  Subjective: Kenneth [name] is a 60 y.o. year old male who is a primary care patient of [name], [name], MD. The care management team was consulted for assistance with disease management and care coordination needs.    Engaged with patient by telephone for follow up visit in response to provider referral for case management and/or care coordination services.   Consent to Services:   Kenneth [name] was given information about Care Management services today including:  1. Care Management services includes personalized support from designated clinical staff supervised by his physician, including individualized plan of care and coordination with other care providers 2. 24/7 contact phone numbers for assistance for urgent and routine care needs. 3. The patient may stop case management services at any time by phone call to the office staff.  Patient agreed to services and consent obtained.   Assessment: Review of patient past medical history, allergies, medications, health status, including review of consultants reports, laboratory and other test data, was performed as part of comprehensive evaluation and provision of chronic care management services.   SDOH (Social Determinants of Health) assessments and interventions performed:    Care Plan  Allergies  Allergen Reactions  . Lisinopril Swelling  . Other Other (See Comments)    Lettuce : rash  . Tomato Rash    Outpatient Encounter Medications as of 12/12/2020  Medication Sig Note  . clotrimazole (MYCELEX) 10 MG troche Take 1 tablet (10 mg total) by mouth 5 (five) times daily.   . insulin glargine (LANTUS SOLOSTAR) 100 UNIT/ML Solostar Pen Inject 45 Units into the skin at bedtime. If fasting blood sugars remain >150 for 1 week, increase insulin dose by 5 units. Maximum of 80 units.   [name] nystatin (MYCOSTATIN) 100000 UNIT/ML suspension Take 5 mLs  (500,000 Units total) by mouth 4 (four) times daily.   [name] acetaminophen (TYLENOL) 325 MG tablet Take 650 mg by mouth every 6 (six) hours as needed for mild pain or headache.   . albuterol (PROVENTIL) (2.5 MG/3ML) 0.083% nebulizer solution INHALE 1 VIAL VIA NEBULIZER 5 TIMES DAILY AS NEEDED FOR WHEEZING OR FOR SHORTNESS OF BREATH (Patient taking differently: Take 2.5 mg by nebulization every 6 (six) hours as needed for shortness of breath.)   . amLODipine (NORVASC) 5 MG tablet Take 5 mg by mouth every morning.   . ARIPiprazole (ABILIFY) 5 MG tablet Take 5 mg by mouth daily. 10/29/2020: Prescribed by  Madison Hickman, NP   . Blood Glucose Monitoring Suppl (ONE TOUCH ULTRA 2) w/Device KIT Use to test blood sugars 1-2 times daily.   . busPIRone (BUSPAR) 15 MG tablet Take 1 tablet (15 mg total) by mouth 2 (two) times daily. 10/29/2020: Prescribed by  Madison Hickman, NP    . carvedilol (COREG) 12.5 MG tablet Take 1 tablet (12.5 mg total) by mouth 2 (two) times daily with a meal.   . DM-GG & DM-APAP-CPM (CORICIDIN HBP DAY/NIGHT COLD) 10-20 &15-200-2 MG MISC Take 1 tablet by mouth every 12 (twelve) hours as needed (cold symptoms).   [name] ELIQUIS 5 MG TABS tablet TAKE ONE TABLET BY MOUTH EVERY MORNING and TAKE ONE TABLET BY MOUTH EVERY EVENING   . famotidine (PEPCID) 20 MG tablet Take 20 mg by mouth daily.   [name] FLUoxetine (PROZAC) 20 MG capsule TAKE 3 CAPSULES BY MOUTH  DAILY 10/29/2020: Prescribed by  Madison Hickman, NP   . Fluticasone-Umeclidin-Vilant (TRELEGY  ELLIPTA) 100-62.5-25 MCG/INH AEPB Inhale 1 puff into the lungs daily.   . furosemide (LASIX) 40 MG tablet Take 1 tablet (40 mg total) by mouth 2 (two) times daily.   [name] gabapentin (NEURONTIN) 100 MG capsule Take 2 capsules (200 mg total) by mouth 3 (three) times daily.   [name] glucose blood (ONETOUCH ULTRA) test strip USE TO TEST BLOOD SUGAR 1-2 TIMES DAILY   . guaiFENesin (MUCINEX) 600 MG 12 hr tablet Take 1 tablet (600 mg total) by mouth 2 (two) times daily.    . hydrALAZINE (APRESOLINE) 25 MG tablet Take 1 tablet (25 mg total) by mouth 3 (three) times daily.   . Insulin Pen Needle (PEN NEEDLES) 31G X 6 MM MISC 1 application by Does not apply route at bedtime.   [name] ipratropium-albuterol (DUONEB) 0.5-2.5 (3) MG/3ML SOLN Take 3 mLs by nebulization every 6 (six) hours as needed. (Patient taking differently: Take 3 mLs by nebulization every 6 (six) hours as needed (For shortness of breath).)   . isosorbide mononitrate (IMDUR) 30 MG 24 hr tablet Take 30 mg by mouth daily.   . Lancets (ONETOUCH ULTRASOFT) lancets USE TO TEST BLOOD SUGAR 1-2 TIMES DAILY   . lubiprostone ([name]) 8 MCG capsule Take 1 capsule (8 mcg total) by mouth 2 (two) times daily with a meal.   . Multiple Vitamin (MULTIVITAMIN WITH MINERALS) TABS tablet Take 1 tablet by mouth daily.   . nitroGLYCERIN (NITROSTAT) 0.4 MG SL tablet DISSOLVE 1 TABLET UNDER THE TONGUE EVERY 5 MINUTES AS&nbsp;&nbsp;NEEDED FOR CHEST PAIN. MAX&nbsp;&nbsp;OF 3 TABLETS IN 15 MINUTES. CALL 911 IF PAIN PERSISTS. (Patient taking differently: Place 0.4 mg under the tongue every 5 (five) minutes as needed for chest pain.) 10/04/2020: Last filled 07/25/20  . pantoprazole (PROTONIX) 40 MG tablet TAKE ONE TABLET BY MOUTH EVERY MORNING   . potassium chloride SA (KLOR-CON) 20 MEQ tablet Take 2 tablets (40 mEq total) by mouth daily.   . predniSONE (DELTASONE) 10 MG tablet Take 1 tablet (10 mg total) by mouth daily with breakfast.   . Roflumilast (DALIRESP) 250 MCG TABS Take 250 mcg by mouth daily. (Patient not taking: Reported on 12/07/2020)   . rosuvastatin (CRESTOR) 20 MG tablet TAKE ONE TABLET BY MOUTH ONCE DAILY   . spironolactone (ALDACTONE) 25 MG tablet Take 1 tablet (25 mg total) by mouth daily.    No facility-administered encounter medications on file as of 12/12/2020.    Patient Active Problem List   Diagnosis Date Noted  . Chronic respiratory failure with hypoxia ([place]) 12/07/2020  . Oral candidiasis 12/07/2020  .  Acute on chronic respiratory failure with hypoxia ([place]) 10/23/2020  . Cardiac arrest ([place]) 10/04/2020  . Acute kidney injury superimposed on CKD ([place]) 09/23/2020  . Thiamine deficiency 07/02/2020  . Obstructive sleep apnea 05/02/2020  . Hyperlipidemia   . Acute on chronic combined systolic (congestive) and diastolic (congestive) heart failure ([place]) 04/07/2020  . Chronic constipation 07/19/2019  . Snoring 03/29/2019  . Insomnia due to other mental disorder 11/15/2018  . Anticoagulant long-term use 10/21/2018  . Gastroesophageal reflux disease without esophagitis 08/10/2018  . Tobacco abuse 08/10/2018  . Chest pain 07/27/2018  . Essential hypertension 07/06/2018  . Chronic obstructive pulmonary disease ([place]) 07/06/2018  . Alcohol abuse 07/06/2018  . Paroxysmal atrial fibrillation ([place]) 11/28/2017  . Slow transit constipation 06/07/2016  . Chronic anemia 06/06/2016  . Hypertensive kidney disease with chronic kidney disease stage III ([place]) 06/06/2016  . Mixed anxiety and depressive disorder 06/05/2016  . Type 2  diabetes mellitus with diabetic neuropathy, with long-term current use of insulin ([place]) 06/05/2016  . Chronic obstructive pulmonary disease with (acute) exacerbation ([facility]) 06/05/2016    Conditions to be addressed/monitored: CHF, COPD, DMII and Anxiety  Care Plan : COPD (Adult)  Updates made by Dannielle Karvonen, RN since 12/12/2020 12:00 AM  Problem: Symptom Exacerbation (COPD)   Priority: High  Long-Range Goal: Symptom Exacerbation Prevented or Minimized   Start Date: 11/07/2020  Expected End Date: 02/18/2021  This Visit's Progress: On track  Recent Progress: On track  Priority: High  Current Barriers:  [name] Knowledge deficits related to basic COPD disease process and self care/management:  Patient reports he called EMS yesterday evening due to some breathing difficulty and elevated blood sugar. He reports he gave himself 3 breathing treatments prior to EMS being called so no  treatment was administered.  He states his breathing concerns had improved.   Patient states he notified his primary care provider office this morning.  Patient reports seeing his pulmonologist 12/07/2020.  He states he was prescribed mouth wash and medication due to having a bacteria infection in his mouth from his inhaler. Patient states he was able to obtain the medication and he is taking it as prescribed.  . Case Manager Clinical Goal(s):   patient will be able to verbalize understanding of COPD action plan and when to seek appropriate levels of medical care: Ongoing review of COPD action plan with patient.    patient will verbalize basic understanding of COPD disease process and self care activities  Interventions:  . Collaboration with [name], MD regarding development and update of comprehensive plan of care as evidenced by provider attestation and co-signature . Inter-disciplinary care team collaboration (see longitudinal plan of care)  Provided patient with verbal COPD education on self care/management/and exacerbation prevention   Provided patient with COPD action plan and reinforced importance of daily self assessment  - communicable disease prevention promoted  - medication-adherence assessment completed:  Reinforced with patient importance of washing mouth out after using inhaler.    - quality of sleep assessed  - self-awareness of symptom triggers encouraged:  Advised patient to stay inside more due to pollen and wear mask when going outside.   Advised patient to keep follow up appointments with providers:  Reminded patient of 01/18/2021 follow up appointment with pulmonology.  Patient Goals/Self-Care Activities:  - eliminate smoking in my home - Be mindful on pollen season which can cause your breathing issues to worsen.  Wear mask when going outside.  - listen for public air quality announcements frequently.  -  Continue to monitor for signs of respiratory  infection, including changes in sputum color, volume and thickness, as well as fever. Report these changes to your doctor as soon as possible.  -Take your medications as prescribed. Continue to refill you medication on a timely basis so that you do not miss doses.  - Keep follow up appointments with your doctors - Contact your provider office pharmacist for updates regarding your medication assistance request for Roflumilast.  - Continue to keep yourself familiar with your  COPD action plan  -Continue to use your oxygen as recommended by your provider and monitor oxygen saturation levels with pulse oximetry . Follow Up Plan: The patient has been provided with contact information for the care management team and has been advised to call with any health related questions or concerns.  The care management team will reach out to the patient again over the next  30 days.      Problem: Coping Skills (General Plan of Care)   Priority: High  Long-Range Goal: Coping Skills Enhanced   Start Date: 11/07/2020  Expected End Date: 03/21/2021  Recent Progress: On track  Priority: High  Current Barriers:   Knowledge Deficits related to limited coping strategies for managing complex medical conditions:  Patient reports having follow up with his primary care provider on 12/05/2020 and had a follow up appointment with social worker to address his anxiety needs on 12/03/2020.  Clinical Goal(s):  [name] Collaboration with [name], MD regarding development and update of comprehensive plan of care as evidenced by provider attestation and co-signature . Inter-disciplinary care team collaboration (see longitudinal plan of care)  Patient will work with care management team to establish personal coping strategies.     Interventions:   Evaluation of current treatment plan related to anxiety/ depression and patient's adherence to plan as established by provider.  Collaboration with [name], MD  regarding development and update of comprehensive plan of care as evidenced by provider attestation       and co-signature  Inter-disciplinary care team collaboration (see longitudinal plan of care)  Discussed plans with patient for ongoing care management follow up and provided patient with direct contact information for care management team  Provided opportunity for patient to express concerns, fears, feelings and expectations regarding his medical conditions/ outcomes.   Patient Goals/Self Care Activities:  Patient will:  . call provider office for new concerns or questions . Continue to work with RN case manager to address care coordination needs and will continue to work with counselor to address mental health needs   .  laugh; watch a funny movie or comedian .  practice relaxation or meditation daily .  talk about feelings with a friend, family or spiritual advisor .  practice positive thinking and self-talk Follow Up Plan: The patient has been provided with contact information for the care management team and has been advised to call with any health related questions or concerns.  The care management team will reach out to the patient again over the next 30 days.     Care Plan : Diabetes Type 2 (Adult)  Updates made by Dannielle Karvonen, RN since 12/12/2020 12:00 AM  Problem: Glycemic Management (Diabetes, Type 2)   Priority: High  Long-Range Goal: Glycemic Management Optimized   Start Date: 11/28/2020  Expected End Date: 03/21/2021  This Visit's Progress: On track  Recent Progress: On track  Priority: High  Objective:  Lab Results  Component Value Date   HGBA1C 6.3 (H) 09/13/2020 .   Lab Results  Component Value Date   CREATININE 1.51 (H) 10/30/2020   CREATININE 1.25 (H) 10/24/2020   CREATININE 1.84 (H) 10/23/2020   Current Barriers:  [name] Knowledge Deficits related to basic Diabetes pathophysiology and self care/management:  Patient states he called EMS yesterday evening due  to breathing difficulty and elevate blood sugars. Patient states his blood sugar registered Hi on his meter. Patient states no treatment was administered by EMS. Patient reports he called his doctors office this morning to notify.  Case Manager Clinical Goal(s):  . patient will demonstrate improved adherence to prescribed treatment plan for diabetes self care/management as evidenced by: daily monitoring and recording of CBG  adherence to ADA/ carb modified diet adherence to prescribed medication regimen contacting provider for new or worsened symptoms or questions Interventions:  . Collaboration with [name], MD regarding development and update  of comprehensive plan of care as evidenced by provider attestation and co-signature . Inter-disciplinary care team collaboration (see longitudinal plan of care):  RN received return call from [facility] on 12/05/2020 that primary provider increased patients lantas to 45 units.  RN called Langley Gauss with primary care provider today to report patient's EMS call for breathing issues and elevated blood sugar. Langley Gauss states she will contact patient today to follow up regarding his concerns/ symptoms and notify his primary care provider.  . Reviewed medications with patient and discussed importance of medication adherence: Patient reports he is taking Lantas 45 U at bedtime as prescribed.  . Discussed plans with patient for ongoing care management follow up and provided patient with direct contact information for care management team . Provided patient with written educational materials related hyperglycemia and importance of correct treatment . Reviewed scheduled/upcoming provider appointments including: Confirmed with patient his next follow up appointment with primary care provider is 12/19/2020.   Self-Care Activities Self administers oral medications as prescribed Self administers insulin as prescribed Attends all scheduled provider appointments Checks blood  sugars as prescribed and utilize hyper and hypoglycemia protocol as needed Adheres to prescribed ADA/carb modified Patient Goals: - Continue to check your blood sugar as advised by your doctor  - Continue to take your diabetic medications as prescribed.  - check your blood sugar if you feel it is too high or too low (Report these ongoing symptoms to your doctor) - Continue to enter your blood sugar readings and medication or insulin into daily log - Always take your blood sugar log or meter to your doctor visits  - Plan to eat low carbohydrate and low salt meals, watch your portion sizes and avoid sugar sweetened drinks.  Follow Up Plan: The patient has been provided with contact information for the care management team and has been advised to call with any health related questions or concerns.  The care management team will reach out to the patient again over the next 2 weeks.        Plan: The patient has been provided with contact information for the care management team and has been advised to call with any health related questions or concerns.  and The care management team will reach out to the patient again over the next 2 weeks.  Quinn Plowman RN,BSN,CCM RN Case Manager [place] [phone]

## 2020-12-12 NOTE — Patient Instructions (Signed)
Visit Information:  Thank you for taking the time to speak with me today.   Goals Addressed            This Visit's Progress   . Monitor and Manage My Blood Sugar-Diabetes Type 2   On track    Timeframe:  Long-Range Goal Priority:  High Start Date:   11/28/2020                        Expected End Date:  03/21/2021                     Follow Up Date 12/19/2020      - Continue to check your blood sugar as advised by your doctor  - Continue to take your diabetic medications as prescribed.  - check your blood sugar if you feel it is too high or too low (Report these ongoing symptoms to your doctor) - Continue to enter your blood sugar readings and medication or insulin into daily log - Always take your blood sugar log or meter to your doctor visits  - Plan to eat low carbohydrate and low salt meals, watch your portion sizes and avoid sugar sweetened drinks.  Why is this important?    Checking your blood sugar at home helps to keep it from getting very high or very low.   Writing the results in a diary or log helps the doctor know how to care for you.   Your blood sugar log should have the time, date and the results.   Also, write down the amount of insulin or other medicine that you take.   Other information, like what you ate, exercise done and how you were feeling, will also be helpful.     Notes:     . Patient will verbalize decrease in anxiety   On track    Timeframe:  Long-Range Goal Priority:  High Start Date:   11/07/2020                          Expected End Date:  03/21/2021                     Follow Up Date 12/19/2020    . call provider office for new concerns or questions . Continue to work with RN case manager to address care coordination needs and will continue to work with counselor to address mental health needs   .  laugh; watch a funny movie or comedian .  practice relaxation or meditation daily .  talk about feelings with a friend, family or spiritual  advisor .  practice positive thinking and self-talk   Why is this important?    When you are stressed, down or upset, your body reacts too.   For example, your blood pressure may get higher; you may have a headache or stomachache.   When your emotions get the best of you, your body's ability to fight off cold and flu gets weak.   These steps will help you manage your emotions.     Notes:     . Track and manage my COPD symptoms.   On track    Timeframe:  Long-Range Goal Priority:  High Start Date:       11/07/2020                    Expected End Date:  02/18/2021              Follow up date: 12/19/2020  - eliminate smoking in my home - Be mindful on pollen season which can cause your breathing issues to worsen.  Wear mask when going outside.  - listen for public air quality announcements frequently.  -  Continue to monitor for signs of respiratory infection, including changes in sputum color, volume and thickness, as well as fever. Report these changes to your doctor as soon as possible.  -Take your medications as prescribed. Continue to refill you medication on a timely basis so that you do not miss doses.  - Keep follow up appointments with your doctors - Contact your provider office pharmacist for updates regarding your medication assistance request for Roflumilast.  - Continue to keep yourself familiar with your  COPD action plan  -Continue to use your oxygen as recommended by your provider and monitor oxygen saturation levels with pulse oximetry   Why is this important?   Triggers are activities or things, like tobacco smoke or cold weather, that make your COPD (chronic obstructive pulmonary disease) flare-up.  Knowing these triggers helps you plan how to stay away from them.  When you cannot remove them, you can learn how to manage them.     Notes:          Patient verbalizes understanding of instructions provided today and agrees to view in MyChart.   The patient  has been provided with contact information for the care management team and has been advised to call with any health related questions or concerns.  The care management team will reach out to the patient again over the next 2 weeks.   George Ina RN,BSN,CCM RN Case Manager [facility] [place] [phone]

## 2020-12-13 DIAGNOSIS — G4733 Obstructive sleep apnea (adult) (pediatric): Secondary | ICD-10-CM | POA: Diagnosis not present

## 2020-12-15 LAB — ALPHA-1 ANTITRYPSIN PHENOTYPE: A-1 Antitrypsin, Ser: 143 mg/dL (ref 83–199)

## 2020-12-18 ENCOUNTER — Other Ambulatory Visit: Payer: Self-pay

## 2020-12-18 ENCOUNTER — Ambulatory Visit: Payer: Medicare Other | Admitting: Cardiology

## 2020-12-18 DIAGNOSIS — J449 Chronic obstructive pulmonary disease, unspecified: Secondary | ICD-10-CM | POA: Diagnosis not present

## 2020-12-18 NOTE — Progress Notes (Signed)
12/18/2020- Patient called inquiring about status of [facility] with [place], patient aware medication will be delivered 12-14 days from call on 12/12/2020. Should be to his home between 12/25/2020 and 12/28/2020. Patient aware he can give me a call if he has not received his medication on 12/28/2020 so we can follow up.  Angelena Sole, CPP notified.

## 2020-12-19 ENCOUNTER — Ambulatory Visit: Payer: Medicare Other

## 2020-12-19 ENCOUNTER — Encounter: Payer: Self-pay | Admitting: Family Medicine

## 2020-12-19 ENCOUNTER — Ambulatory Visit (INDEPENDENT_AMBULATORY_CARE_PROVIDER_SITE_OTHER): Payer: Medicare Other | Admitting: Family Medicine

## 2020-12-19 VITALS — BP 156/82 | HR 81 | Temp 98.3°F | Ht 72.0 in | Wt 194.2 lb

## 2020-12-19 DIAGNOSIS — I152 Hypertension secondary to endocrine disorders: Secondary | ICD-10-CM | POA: Diagnosis not present

## 2020-12-19 DIAGNOSIS — E119 Type 2 diabetes mellitus without complications: Secondary | ICD-10-CM

## 2020-12-19 DIAGNOSIS — E1140 Type 2 diabetes mellitus with diabetic neuropathy, unspecified: Secondary | ICD-10-CM

## 2020-12-19 DIAGNOSIS — J441 Chronic obstructive pulmonary disease with (acute) exacerbation: Secondary | ICD-10-CM | POA: Diagnosis not present

## 2020-12-19 DIAGNOSIS — I10 Essential (primary) hypertension: Secondary | ICD-10-CM | POA: Diagnosis not present

## 2020-12-19 DIAGNOSIS — E1159 Type 2 diabetes mellitus with other circulatory complications: Secondary | ICD-10-CM

## 2020-12-19 DIAGNOSIS — I5043 Acute on chronic combined systolic (congestive) and diastolic (congestive) heart failure: Secondary | ICD-10-CM

## 2020-12-19 DIAGNOSIS — F418 Other specified anxiety disorders: Secondary | ICD-10-CM | POA: Diagnosis not present

## 2020-12-19 DIAGNOSIS — Z794 Long term (current) use of insulin: Secondary | ICD-10-CM

## 2020-12-19 DIAGNOSIS — J449 Chronic obstructive pulmonary disease, unspecified: Secondary | ICD-10-CM | POA: Diagnosis not present

## 2020-12-19 LAB — POCT GLUCOSE (DEVICE FOR HOME USE): Glucose Fasting, POC: 223 mg/dL — AB (ref 70–99)

## 2020-12-19 MED ORDER — LANTUS SOLOSTAR 100 UNIT/ML ~~LOC~~ SOPN: 55.0000 [IU] | PEN_INJECTOR | Freq: Every day | SUBCUTANEOUS | 2 refills | Status: DC

## 2020-12-19 NOTE — Patient Instructions (Signed)
Visit Information  PATIENT GOALS: Goals Addressed            This Visit's Progress   . Monitor and Manage My Blood Sugar-Diabetes Type 2   On track    Timeframe:  Long-Range Goal Priority:  High Start Date:   11/28/2020                        Expected End Date:  03/21/2021                     Follow Up Date  01/16/2021     - Continue to check your blood sugar as advised by your doctor  - Continue to take your diabetic medications as prescribed.  - check your blood sugar if you feel it is too high or too low (Report these ongoing symptoms to your doctor) - Continue to enter your blood sugar readings and medication or insulin into daily log - Always take your blood sugar log or meter to your doctor visits  - Plan to eat low carbohydrate and low salt meals, watch your portion sizes and avoid sugar sweetened drinks.  Why is this important?    Checking your blood sugar at home helps to keep it from getting very high or very low.   Writing the results in a diary or log helps the doctor know how to care for you.   Your blood sugar log should have the time, date and the results.   Also, write down the amount of insulin or other medicine that you take.   Other information, like what you ate, exercise done and how you were feeling, will also be helpful.            Patient verbalizes understanding of instructions provided today and agrees to view in MyChart.   The patient has been provided with contact information for the care management team and has been advised to call with any health related questions or concerns.  The care management team will reach out to the patient again over the next 30 days.   George Ina RN,BSN,CCM RN Case Manager [facility] [place] [phone]

## 2020-12-19 NOTE — Progress Notes (Signed)
[facility] [address] [place] [address] Dept: [phone] Dept Fax: [phone]  Office Visit  Subjective:    Patient ID: Kenneth [name], male    DOB: 1960-11-10, 60 y.o..   MRN: [id number]  Chief Complaint  Patient presents with  . Follow-up    F/u DM.  Average BS 220, using 50 units Lantus @ night.  C/o seeing BS during the daytime 400's.      History of Present Illness:  Patient is in today for reassessment of his blood sugars. He was seen on 2/23 due to a change in his medication that had contributed to hyperglycemia.He had been in the hospitalseveral weeks prior to thatfor an exacerbation of heart failure. At discharge, his metformin had been stopped (presumably due to its negative impact on CHF) and he had been started on Lantus 10 units qhs for diabetes management. Since discharge, he had noted his blood sugars to be running >500. We are now working with him to gradually increase the dose of his Lantus. His last increase put him at 50 units daily.  Kenneth [name] notes his fasting blood sugar this morning was ~ 220, which has also been about his average. He notes he had been worried about his blood sugars, but is now feeling more positive about this.  Kenneth [name] notes his breathing is stable. He is using his home O2 without complaints and remains on his inhalers. He is still awaiting approval of Daliresp, which had been recommended by his pulmonologist.  Past Medical History: Patient Active Problem List   Diagnosis Date Noted  . Chronic respiratory failure with hypoxia ([place]) 12/07/2020  . Oral candidiasis 12/07/2020  . Acute on chronic respiratory failure with hypoxia ([place]) 10/23/2020  . Cardiac arrest ([place]) 10/04/2020  . Acute kidney injury superimposed on CKD ([place]) 09/23/2020  . Thiamine deficiency 07/02/2020  . Obstructive sleep apnea 05/02/2020  . Hyperlipidemia   . Acute on chronic combined systolic  (congestive) and diastolic (congestive) heart failure ([place]) 04/07/2020  . Chronic constipation 07/19/2019  . Snoring 03/29/2019  . Insomnia due to other mental disorder 11/15/2018  . Anticoagulant long-term use 10/21/2018  . Gastroesophageal reflux disease without esophagitis 08/10/2018  . Tobacco abuse 08/10/2018  . Chest pain 07/27/2018  . Essential hypertension 07/06/2018  . Chronic obstructive pulmonary disease ([place]) 07/06/2018  . Alcohol abuse 07/06/2018  . Paroxysmal atrial fibrillation ([place]) 11/28/2017  . Slow transit constipation 06/07/2016  . Chronic anemia 06/06/2016  . Hypertensive kidney disease with chronic kidney disease stage III ([place]) 06/06/2016  . Mixed anxiety and depressive disorder 06/05/2016  . Type 2 diabetes mellitus with diabetic neuropathy, with long-term current use of insulin ([place]) 06/05/2016  . Chronic obstructive pulmonary disease with (acute) exacerbation ([place]) 06/05/2016   Past Surgical History:  Procedure Laterality Date  . NO PAST SURGERIES     Family History  Problem Relation Age of Onset  . Diabetes Mother   . Heart disease Mother   . Diabetes Father   . Heart disease Father   . Diabetes Sister   . Heart disease Sister   . Kidney disease Sister   . Coronary artery disease Brother   . Liver disease Maternal Uncle    Outpatient Medications Prior to Visit  Medication Sig Dispense Refill  . acetaminophen (TYLENOL) 325 MG tablet Take 650 mg by mouth every 6 (six) hours as needed for mild pain or headache.    . albuterol (PROVENTIL) (2.5 MG/3ML) 0.083% nebulizer solution  INHALE 1 VIAL VIA NEBULIZER 5 TIMES DAILY AS NEEDED FOR WHEEZING OR FOR SHORTNESS OF BREATH (Patient taking differently: Take 2.5 mg by nebulization every 6 (six) hours as needed for shortness of breath.) 150 mL 1  . amLODipine (NORVASC) 5 MG tablet Take 5 mg by mouth every morning.    . ARIPiprazole (ABILIFY) 5 MG tablet Take 5 mg by mouth daily.    . Blood Glucose Monitoring  Suppl (ONE TOUCH ULTRA 2) w/Device KIT Use to test blood sugars 1-2 times daily. 1 kit 0  . busPIRone (BUSPAR) 15 MG tablet Take 1 tablet (15 mg total) by mouth 2 (two) times daily. 180 tablet 1  . carvedilol (COREG) 12.5 MG tablet Take 1 tablet (12.5 mg total) by mouth 2 (two) times daily with a meal. 180 tablet 3  . clotrimazole (MYCELEX) 10 MG troche Take 1 tablet (10 mg total) by mouth 5 (five) times daily. 35 Troche 0  . DM-GG & DM-APAP-CPM (CORICIDIN HBP DAY/NIGHT COLD) 10-20 &15-200-2 MG MISC Take 1 tablet by mouth every 12 (twelve) hours as needed (cold symptoms).    [name] ELIQUIS 5 MG TABS tablet TAKE ONE TABLET BY MOUTH EVERY MORNING and TAKE ONE TABLET BY MOUTH EVERY EVENING 180 tablet 1  . famotidine (PEPCID) 20 MG tablet Take 20 mg by mouth daily.    [name] FLUoxetine (PROZAC) 20 MG capsule TAKE 3 CAPSULES BY MOUTH  DAILY 270 capsule 3  . Fluticasone-Umeclidin-Vilant (TRELEGY ELLIPTA) 100-62.5-25 MCG/INH AEPB Inhale 1 puff into the lungs daily. 60 each 5  . furosemide (LASIX) 40 MG tablet Take 1 tablet (40 mg total) by mouth 2 (two) times daily. 60 tablet 11  . gabapentin (NEURONTIN) 100 MG capsule Take 2 capsules (200 mg total) by mouth 3 (three) times daily. 180 capsule 3  . glucose blood (ONETOUCH ULTRA) test strip USE TO TEST BLOOD SUGAR 1-2 TIMES DAILY 100 each 11  . guaiFENesin (MUCINEX) 600 MG 12 hr tablet Take 1 tablet (600 mg total) by mouth 2 (two) times daily. 30 tablet 0  . hydrALAZINE (APRESOLINE) 25 MG tablet Take 1 tablet (25 mg total) by mouth 3 (three) times daily. 90 tablet 5  . Insulin Pen Needle (PEN NEEDLES) 31G X 6 MM MISC 1 application by Does not apply route at bedtime. 100 each 1  . ipratropium-albuterol (DUONEB) 0.5-2.5 (3) MG/3ML SOLN Take 3 mLs by nebulization every 6 (six) hours as needed. (Patient taking differently: Take 3 mLs by nebulization every 6 (six) hours as needed (For shortness of breath).) 360 mL 0  . isosorbide mononitrate (IMDUR) 30 MG 24 hr tablet Take  30 mg by mouth daily.    . Lancets (ONETOUCH ULTRASOFT) lancets USE TO TEST BLOOD SUGAR 1-2 TIMES DAILY 100 each 11  . lubiprostone ([name]) 8 MCG capsule Take 1 capsule (8 mcg total) by mouth 2 (two) times daily with a meal. 60 capsule 0  . Multiple Vitamin (MULTIVITAMIN WITH MINERALS) TABS tablet Take 1 tablet by mouth daily.    . nitroGLYCERIN (NITROSTAT) 0.4 MG SL tablet DISSOLVE 1 TABLET UNDER THE TONGUE EVERY 5 MINUTES AS&nbsp;&nbsp;NEEDED FOR CHEST PAIN. MAX&nbsp;&nbsp;OF 3 TABLETS IN 15 MINUTES. CALL 911 IF PAIN PERSISTS. (Patient taking differently: Place 0.4 mg under the tongue every 5 (five) minutes as needed for chest pain.) 75 tablet 4  . nystatin (MYCOSTATIN) 100000 UNIT/ML suspension Take 5 mLs (500,000 Units total) by mouth 4 (four) times daily. 100 mL 0  . pantoprazole (PROTONIX) 40 MG tablet TAKE ONE  TABLET BY MOUTH EVERY MORNING 30 tablet 4  . potassium chloride SA (KLOR-CON) 20 MEQ tablet Take 2 tablets (40 mEq total) by mouth daily. 60 tablet 5  . predniSONE (DELTASONE) 10 MG tablet Take 1 tablet (10 mg total) by mouth daily with breakfast. 30 tablet 2  . Roflumilast (DALIRESP) 250 MCG TABS Take 250 mcg by mouth daily. 30 tablet 11  . rosuvastatin (CRESTOR) 20 MG tablet TAKE ONE TABLET BY MOUTH ONCE DAILY 30 tablet 3  . insulin glargine (LANTUS SOLOSTAR) 100 UNIT/ML Solostar Pen Inject 45 Units into the skin at bedtime. If fasting blood sugars remain >150 for 1 week, increase insulin dose by 5 units. Maximum of 80 units. 15 mL 2  . spironolactone (ALDACTONE) 25 MG tablet Take 1 tablet (25 mg total) by mouth daily. 30 tablet 0   No facility-administered medications prior to visit.   Allergies  Allergen Reactions  . Lisinopril Swelling  . Other Other (See Comments)    Lettuce : rash  . Tomato Rash     Objective:   Today's Vitals   12/19/20 0908  BP: (!) 156/82  Pulse: 81  Temp: 98.3 F (36.8 C)  TempSrc: Temporal  SpO2: 97%  Weight: 194 lb 3.2 oz (88.1 kg)   Height: 6' (1.829 m)   Body mass index is 26.34 kg/m.   General: Well developed, well nourished. No acute distress. Lungs: Decreased breath sounds throughout with slow expiratory phase. CV: Distant heart sounds. RRR without murmurs or rubs. Pulses 2+ bilaterally. Psych: Alert and oriented x3. Normal mood and affect.  Health Maintenance Due  Topic Date Due  . COLONOSCOPY (Pts 45-66yr Insurance coverage will need to be confirmed)  09/23/2019  . OPHTHALMOLOGY EXAM  05/12/2020    Lab Results POC Glucose: 223  Assessment & Plan:   1. Type 2 diabetes mellitus with diabetic neuropathy, with long-term current use of insulin ([facility] Mr. DCerviniis showing improvements in his glucose levels. I will increase his Lantus to 55 units daily. Once his fasting glucoses are under 200, Iw ould like to add an SGLT2 inhibitor, as it will helpt o further fine tune his blood sugars, but also provide some CV benefit.  - POCT Glucose (Device for Home Use) - insulin glargine (LANTUS SOLOSTAR) 100 UNIT/ML Solostar Pen; Inject 55 Units into the skin at bedtime. If fasting blood sugars remain >150 for 1 week, increase insulin dose by 5 units. Maximum of 80 units.  Dispense: 15 mL; Refill: 2  2. Chronic obstructive pulmonary disease with acute exacerbation ([place] I reviewed notes form his pulmonary NP. He is currently stable on triple therapy. Daliresp approval pending. Continue home 02 and palliative care.    SHaydee Salter MD

## 2020-12-19 NOTE — Chronic Care Management (AMB) (Signed)
Chronic Care Management   CCM RN Visit Note  12/19/2020 Name: Kenneth [name] MRN: [id number] DOB: [birth year]  Subjective: Kenneth [name] is a 60 y.o. year old male who is a primary care patient of [name], [name], MD. The care management team was consulted for assistance with disease management and care coordination needs.    Engaged with patient' significant other/ designated party release, Delores Waters by telephone for follow up visit in response to provider referral for case management and/or care coordination services. HIPAA verified by Ms. Waters.   Consent to Services:  The patient was given information about Chronic Care Management services, agreed to services, and gave verbal consent prior to initiation of services.  Please see initial visit note for detailed documentation.   Patient agreed to services and verbal consent obtained.   Assessment: Review of patient past medical history, allergies, medications, health status, including review of consultants reports, laboratory and other test data, was performed as part of comprehensive evaluation and provision of chronic care management services.   SDOH (Social Determinants of Health) assessments and interventions performed:    CCM Care Plan  Allergies  Allergen Reactions  . Lisinopril Swelling  . Other Other (See Comments)    Lettuce : rash  . Tomato Rash    Outpatient Encounter Medications as of 12/19/2020  Medication Sig Note  . acetaminophen (TYLENOL) 325 MG tablet Take 650 mg by mouth every 6 (six) hours as needed for mild pain or headache.   . albuterol (PROVENTIL) (2.5 MG/3ML) 0.083% nebulizer solution INHALE 1 VIAL VIA NEBULIZER 5 TIMES DAILY AS NEEDED FOR WHEEZING OR FOR SHORTNESS OF BREATH (Patient taking differently: Take 2.5 mg by nebulization every 6 (six) hours as needed for shortness of breath.)   . amLODipine (NORVASC) 5 MG tablet Take 5 mg by mouth every morning.   . ARIPiprazole (ABILIFY) 5 MG tablet Take 5 mg  by mouth daily. 10/29/2020: Prescribed by  Madison Hickman, NP   . Blood Glucose Monitoring Suppl (ONE TOUCH ULTRA 2) w/Device KIT Use to test blood sugars 1-2 times daily.   . busPIRone (BUSPAR) 15 MG tablet Take 1 tablet (15 mg total) by mouth 2 (two) times daily. 10/29/2020: Prescribed by  Madison Hickman, NP    . carvedilol (COREG) 12.5 MG tablet Take 1 tablet (12.5 mg total) by mouth 2 (two) times daily with a meal.   . clotrimazole (MYCELEX) 10 MG troche Take 1 tablet (10 mg total) by mouth 5 (five) times daily.   [name] DM-GG & DM-APAP-CPM (CORICIDIN HBP DAY/NIGHT COLD) 10-20 &15-200-2 MG MISC Take 1 tablet by mouth every 12 (twelve) hours as needed (cold symptoms).   [name] ELIQUIS 5 MG TABS tablet TAKE ONE TABLET BY MOUTH EVERY MORNING and TAKE ONE TABLET BY MOUTH EVERY EVENING   . famotidine (PEPCID) 20 MG tablet Take 20 mg by mouth daily.   [name] FLUoxetine (PROZAC) 20 MG capsule TAKE 3 CAPSULES BY MOUTH  DAILY 10/29/2020: Prescribed by  Madison Hickman, NP   . Fluticasone-Umeclidin-Vilant (TRELEGY ELLIPTA) 100-62.5-25 MCG/INH AEPB Inhale 1 puff into the lungs daily.   . furosemide (LASIX) 40 MG tablet Take 1 tablet (40 mg total) by mouth 2 (two) times daily.   [name] gabapentin (NEURONTIN) 100 MG capsule Take 2 capsules (200 mg total) by mouth 3 (three) times daily.   [name] glucose blood (ONETOUCH ULTRA) test strip USE TO TEST BLOOD SUGAR 1-2 TIMES DAILY   . guaiFENesin (MUCINEX) 600 MG 12 hr tablet Take 1 tablet (600  mg total) by mouth 2 (two) times daily.   . hydrALAZINE (APRESOLINE) 25 MG tablet Take 1 tablet (25 mg total) by mouth 3 (three) times daily.   . insulin glargine (LANTUS SOLOSTAR) 100 UNIT/ML Solostar Pen Inject 55 Units into the skin at bedtime. If fasting blood sugars remain >150 for 1 week, increase insulin dose by 5 units. Maximum of 80 units.   . Insulin Pen Needle (PEN NEEDLES) 31G X 6 MM MISC 1 application by Does not apply route at bedtime.   [name] ipratropium-albuterol (DUONEB) 0.5-2.5 (3)  MG/3ML SOLN Take 3 mLs by nebulization every 6 (six) hours as needed. (Patient taking differently: Take 3 mLs by nebulization every 6 (six) hours as needed (For shortness of breath).)   . isosorbide mononitrate (IMDUR) 30 MG 24 hr tablet Take 30 mg by mouth daily.   . Lancets (ONETOUCH ULTRASOFT) lancets USE TO TEST BLOOD SUGAR 1-2 TIMES DAILY   . lubiprostone ([name]) 8 MCG capsule Take 1 capsule (8 mcg total) by mouth 2 (two) times daily with a meal.   . Multiple Vitamin (MULTIVITAMIN WITH MINERALS) TABS tablet Take 1 tablet by mouth daily.   . nitroGLYCERIN (NITROSTAT) 0.4 MG SL tablet DISSOLVE 1 TABLET UNDER THE TONGUE EVERY 5 MINUTES AS&nbsp;&nbsp;NEEDED FOR CHEST PAIN. MAX&nbsp;&nbsp;OF 3 TABLETS IN 15 MINUTES. CALL 911 IF PAIN PERSISTS. (Patient taking differently: Place 0.4 mg under the tongue every 5 (five) minutes as needed for chest pain.) 10/04/2020: Last filled 07/25/20  . nystatin (MYCOSTATIN) 100000 UNIT/ML suspension Take 5 mLs (500,000 Units total) by mouth 4 (four) times daily.   . pantoprazole (PROTONIX) 40 MG tablet TAKE ONE TABLET BY MOUTH EVERY MORNING   . potassium chloride SA (KLOR-CON) 20 MEQ tablet Take 2 tablets (40 mEq total) by mouth daily.   . predniSONE (DELTASONE) 10 MG tablet Take 1 tablet (10 mg total) by mouth daily with breakfast.   . Roflumilast (DALIRESP) 250 MCG TABS Take 250 mcg by mouth daily.   . rosuvastatin (CRESTOR) 20 MG tablet TAKE ONE TABLET BY MOUTH ONCE DAILY   . spironolactone (ALDACTONE) 25 MG tablet Take 1 tablet (25 mg total) by mouth daily.    No facility-administered encounter medications on file as of 12/19/2020.    Patient Active Problem List   Diagnosis Date Noted  . Chronic respiratory failure with hypoxia ([place]) 12/07/2020  . Oral candidiasis 12/07/2020  . Acute on chronic respiratory failure with hypoxia ([place]) 10/23/2020  . Cardiac arrest ([place]) 10/04/2020  . Acute kidney injury superimposed on CKD ([place]) 09/23/2020  . Thiamine  deficiency 07/02/2020  . Obstructive sleep apnea 05/02/2020  . Hyperlipidemia   . Acute on chronic combined systolic (congestive) and diastolic (congestive) heart failure ([place]) 04/07/2020  . Chronic constipation 07/19/2019  . Snoring 03/29/2019  . Insomnia due to other mental disorder 11/15/2018  . Anticoagulant long-term use 10/21/2018  . Gastroesophageal reflux disease without esophagitis 08/10/2018  . Tobacco abuse 08/10/2018  . Chest pain 07/27/2018  . Essential hypertension 07/06/2018  . Chronic obstructive pulmonary disease ([place]) 07/06/2018  . Alcohol abuse 07/06/2018  . Paroxysmal atrial fibrillation ([place]) 11/28/2017  . Slow transit constipation 06/07/2016  . Chronic anemia 06/06/2016  . Hypertensive kidney disease with chronic kidney disease stage III ([place]) 06/06/2016  . Mixed anxiety and depressive disorder 06/05/2016  . Type 2 diabetes mellitus with diabetic neuropathy, with long-term current use of insulin ([place]) 06/05/2016  . Chronic obstructive pulmonary disease with (acute) exacerbation ([place]) 06/05/2016    Conditions to be  addressed/monitored: DMII  Care Plan : Diabetes Type 2 (Adult)  Updates made by Dannielle Karvonen, RN since 12/19/2020 12:00 AM  Problem: Glycemic Management (Diabetes, Type 2)   Priority: High  Long-Range Goal: Glycemic Management Optimized   Start Date: 11/28/2020  Expected End Date: 03/21/2021  This Visit's Progress: On track  Recent Progress: On track  Priority: High  Objective:  Lab Results  Component Value Date   HGBA1C 6.3 (H) 09/13/2020 .   Lab Results  Component Value Date   CREATININE 1.51 (H) 10/30/2020   CREATININE 1.25 (H) 10/24/2020   CREATININE 1.84 (H) 10/23/2020   Current Barriers:  [name] Knowledge Deficits related to basic Diabetes pathophysiology and self care/management:   . Case Manager Clinical Goal(s):  . patient will demonstrate improved adherence to prescribed treatment plan for diabetes self care/management as evidenced  by: daily monitoring and recording of CBG  adherence to ADA/ carb modified diet adherence to prescribed medication regimen contacting provider for new or worsened symptoms or questions Interventions:  . Collaboration with [name], MD regarding development and update of comprehensive plan of care as evidenced by provider attestation and co-signature . Inter-disciplinary care team collaboration (see longitudinal plan of care):   . Reviewed medications with patient and discussed importance of medication adherence:   . Discussed plans with patient for ongoing care management follow up and provided patient with direct contact information for care management team:  Spoke with patients significant other/ designated party release, Delores Waters regarding patients status.  Ms. Morton Stall states patient saw the doctor today and the doctor increased his Lantas to 55 units. She states patients blood sugars are now ranging from 200-300's.  She reports patient is scheduled to follow up with the doctor again in 3 weeks.  . Provided patient with written educational materials related hyperglycemia and importance of correct treatment . Reviewed scheduled/upcoming provider appointments including: Per chart review patient is scheduled to follow up with the pharmacist on 12/25/2020 and his primary care provider on 01/09/2021. Self-Care Activities Self administers oral medications as prescribed Self administers insulin as prescribed Attends all scheduled provider appointments Checks blood sugars as prescribed and utilize hyper and hypoglycemia protocol as needed Adheres to prescribed ADA/carb modified Patient Goals: - Continue to check your blood sugar as advised by your doctor  - Continue to take your diabetic medications as prescribed.  - check your blood sugar if you feel it is too high or too low (Report these ongoing symptoms to your doctor) - Continue to enter your blood sugar readings and medication or  insulin into daily log - Always take your blood sugar log or meter to your doctor visits  - Plan to eat low carbohydrate and low salt meals, watch your portion sizes and avoid sugar sweetened drinks.  Follow Up Plan: The patient has been provided with contact information for the care management team and has been advised to call with any health related questions or concerns.  The care management team will reach out to the patient again over the next 2 weeks.        Plan:The patient has been provided with contact information for the care management team and has been advised to call with any health related questions or concerns.  and The care management team will reach out to the patient again over the next 30 days.  Quinn Plowman RN,BSN,CCM RN Case Manager [place]  [phone]

## 2020-12-19 NOTE — Progress Notes (Signed)
o

## 2020-12-21 ENCOUNTER — Telehealth: Payer: Self-pay | Admitting: Family Medicine

## 2020-12-21 ENCOUNTER — Telehealth: Payer: Self-pay

## 2020-12-21 ENCOUNTER — Other Ambulatory Visit: Payer: Self-pay

## 2020-12-21 DIAGNOSIS — K5909 Other constipation: Secondary | ICD-10-CM

## 2020-12-21 MED ORDER — LUBIPROSTONE 8 MCG PO CAPS: 8.0000 ug | ORAL_CAPSULE | Freq: Two times a day (BID) | ORAL | 0 refills | Status: DC

## 2020-12-21 NOTE — Telephone Encounter (Signed)
[facility] is calling to get a refill on patients [name] and Lantus. Please call them back at [phone] if you have any questions.

## 2020-12-21 NOTE — Telephone Encounter (Signed)
rx sent to the pharmacy for [name] refill but th lantus was sent to them 12/19/20. Dm/cma

## 2020-12-21 NOTE — Progress Notes (Signed)
Chronic Care Management Pharmacy Assistant   Name: Kenneth [name]  MRN: [id number] DOB: 1961-01-04  Reason for Encounter: Medication Review/Medication Coordination Call.   Recent office visits:  12/19/2020 PCP [name] [name] Increase lantus - inject 55 Units into the skin at bedtime. If fasting blood sugars remain >150 for 1 week, increase insulin dose by 5 units. Maximum of 80 units 12/19/2020 CCM Quinn Plowman   Recent consult visits:  None ID  Hospital visits:  None in previous 6 months  Medications: Outpatient Encounter Medications as of 12/21/2020  Medication Sig Note  . acetaminophen (TYLENOL) 325 MG tablet Take 650 mg by mouth every 6 (six) hours as needed for mild pain or headache.   . albuterol (PROVENTIL) (2.5 MG/3ML) 0.083% nebulizer solution INHALE 1 VIAL VIA NEBULIZER 5 TIMES DAILY AS NEEDED FOR WHEEZING OR FOR SHORTNESS OF BREATH (Patient taking differently: Take 2.5 mg by nebulization every 6 (six) hours as needed for shortness of breath.)   . amLODipine (NORVASC) 5 MG tablet Take 5 mg by mouth every morning.   . ARIPiprazole (ABILIFY) 5 MG tablet Take 5 mg by mouth daily. 10/29/2020: Prescribed by  Madison Hickman, NP   . Blood Glucose Monitoring Suppl (ONE TOUCH ULTRA 2) w/Device KIT Use to test blood sugars 1-2 times daily.   . busPIRone (BUSPAR) 15 MG tablet Take 1 tablet (15 mg total) by mouth 2 (two) times daily. 10/29/2020: Prescribed by  Madison Hickman, NP    . carvedilol (COREG) 12.5 MG tablet Take 1 tablet (12.5 mg total) by mouth 2 (two) times daily with a meal.   . clotrimazole (MYCELEX) 10 MG troche Take 1 tablet (10 mg total) by mouth 5 (five) times daily.   [name] DM-GG & DM-APAP-CPM (CORICIDIN HBP DAY/NIGHT COLD) 10-20 &15-200-2 MG MISC Take 1 tablet by mouth every 12 (twelve) hours as needed (cold symptoms).   [name] ELIQUIS 5 MG TABS tablet TAKE ONE TABLET BY MOUTH EVERY MORNING and TAKE ONE TABLET BY MOUTH EVERY EVENING   . famotidine (PEPCID) 20 MG tablet Take  20 mg by mouth daily.   [name] FLUoxetine (PROZAC) 20 MG capsule TAKE 3 CAPSULES BY MOUTH  DAILY 10/29/2020: Prescribed by  Madison Hickman, NP   . Fluticasone-Umeclidin-Vilant (TRELEGY ELLIPTA) 100-62.5-25 MCG/INH AEPB Inhale 1 puff into the lungs daily.   . furosemide (LASIX) 40 MG tablet Take 1 tablet (40 mg total) by mouth 2 (two) times daily.   [name] gabapentin (NEURONTIN) 100 MG capsule Take 2 capsules (200 mg total) by mouth 3 (three) times daily.   [name] glucose blood (ONETOUCH ULTRA) test strip USE TO TEST BLOOD SUGAR 1-2 TIMES DAILY   . guaiFENesin (MUCINEX) 600 MG 12 hr tablet Take 1 tablet (600 mg total) by mouth 2 (two) times daily.   . hydrALAZINE (APRESOLINE) 25 MG tablet Take 1 tablet (25 mg total) by mouth 3 (three) times daily.   . insulin glargine (LANTUS SOLOSTAR) 100 UNIT/ML Solostar Pen Inject 55 Units into the skin at bedtime. If fasting blood sugars remain >150 for 1 week, increase insulin dose by 5 units. Maximum of 80 units.   . Insulin Pen Needle (PEN NEEDLES) 31G X 6 MM MISC 1 application by Does not apply route at bedtime.   [name] ipratropium-albuterol (DUONEB) 0.5-2.5 (3) MG/3ML SOLN Take 3 mLs by nebulization every 6 (six) hours as needed. (Patient taking differently: Take 3 mLs by nebulization every 6 (six) hours as needed (For shortness of breath).)   . isosorbide mononitrate (  IMDUR) 30 MG 24 hr tablet Take 30 mg by mouth daily.   . Lancets (ONETOUCH ULTRASOFT) lancets USE TO TEST BLOOD SUGAR 1-2 TIMES DAILY   . lubiprostone ([name]) 8 MCG capsule Take 1 capsule (8 mcg total) by mouth 2 (two) times daily with a meal.   . Multiple Vitamin (MULTIVITAMIN WITH MINERALS) TABS tablet Take 1 tablet by mouth daily.   . nitroGLYCERIN (NITROSTAT) 0.4 MG SL tablet DISSOLVE 1 TABLET UNDER THE TONGUE EVERY 5 MINUTES AS&nbsp;&nbsp;NEEDED FOR CHEST PAIN. MAX&nbsp;&nbsp;OF 3 TABLETS IN 15 MINUTES. CALL 911 IF PAIN PERSISTS. (Patient taking differently: Place 0.4 mg under the tongue every 5 (five)  minutes as needed for chest pain.) 10/04/2020: Last filled 07/25/20  . nystatin (MYCOSTATIN) 100000 UNIT/ML suspension Take 5 mLs (500,000 Units total) by mouth 4 (four) times daily.   . pantoprazole (PROTONIX) 40 MG tablet TAKE ONE TABLET BY MOUTH EVERY MORNING   . potassium chloride SA (KLOR-CON) 20 MEQ tablet Take 2 tablets (40 mEq total) by mouth daily.   . predniSONE (DELTASONE) 10 MG tablet Take 1 tablet (10 mg total) by mouth daily with breakfast.   . Roflumilast (DALIRESP) 250 MCG TABS Take 250 mcg by mouth daily.   . rosuvastatin (CRESTOR) 20 MG tablet TAKE ONE TABLET BY MOUTH ONCE DAILY   . spironolactone (ALDACTONE) 25 MG tablet Take 1 tablet (25 mg total) by mouth daily.    No facility-administered encounter medications on file as of 12/21/2020.    Star Rating Drugs:rosuvastatin 20 mg,  Reviewed chart for medication changes ahead of medication coordination call.   BP Readings from Last 3 Encounters:  12/19/20 (!) 156/82  12/07/20 (!) 150/80  12/05/20 (!) 142/76    Lab Results  Component Value Date   HGBA1C 6.3 (H) 09/13/2020     Patient obtains medications through Vials  30 Days   Last adherence delivery included:   Amlodipine 5MG Tablet Daily -Breakfast  Aripiprazole 5 mg daily   Buspirone 15 MG one  Tablet Two Times Daily - Breakfast, Evening meals  Eliquis 5 MG OneTablets twice daily -Breakfast, Evening Meals  Fluoxetine 20 MG Capsule Three Capsules Daily  Furosemide 40 Mg One Tablet Twice Daily - Breakfast, Evening Meals   Pantoprazole 40 MG One Tablet Daily -Breakfast  Rosuvastatin 20 mg daily  Nitroglycerin 0.4MG SL Tablet PRN  Albuterol 108 MCG/ACT inhalerPRN   Lubiprostone 8 MCG One  Capsule Twice Daily- Breakfast, Evening Meals  Potassium 20 meq two tablets by mouth daily  Hydralazine 25 mg Take one tablet 3 times daily  insulin glargine (LANTUS SOLOSTAR) 100 UNIT/ML Solostar Pen; Inject 30 Units into the skin at bedtime. If fasting blood  sugars remain >150 for 1 week, increase insulin dose by 5 units. Maximum of 80 units  Coordinated acute fill for medications to be delivered.             Spironolactone 25 mg-deliver on 11/23/2020             Isosorbide Mononitrate 30 MG Tablet Daily -deliver on 11/23/2020  Patient declined medications last month:  Metformin HCI 500 MG Tablet Twice Daily- (patient states he not taking per instruction from provider)  Gabapentin 100 mg capsule-take 2 capsule by mouth 3 times daily(adequate supply)  Albuterol 0.83% 25CT SOLN PRN- Patient states he will get this from a company.  One Touch U-Soft Lancet 100-Adequate supply  One touch Ult 100CT Test strip- Adequate Supply  Patient is due for next adherence delivery on: 12/28/2020. Called  patient and reviewed medications and coordinated delivery.  This delivery to include:  Amlodipine 5MG Tablet Daily -Breakfast  Aripiprazole 5 mg daily   Buspirone 15 MG one  Tablet Two Times Daily - Breakfast, Evening meals  Eliquis 5 MG OneTablets twice daily -Breakfast, Evening Meals  Fluoxetine 20 MG Capsule Three Capsules Daily  Furosemide 40 Mg One Tablet Twice Daily - Breakfast, Evening Meals   Pantoprazole 40 MG One Tablet Daily -Breakfast  Rosuvastatin 20 mg daily  Albuterol 108 MCG/ACT inhalerPRN   Lubiprostone 8 MCG One  Capsule Twice Daily- Breakfast, Evening Meals  Potassium 20 meq two tablets by mouth daily  Hydralazine 25 mg Take one tablet 3 times daily  insulin glargine (LANTUS SOLOSTAR) 100 UNIT/ML Solostar- inject 55 Units into the skin at bedtime. If fasting blood sugars remain >150 for 1 week, increase insulin dose by 5 units. Maximum of 80 units  One touch Ult 100CT Test strip  Patient declined the following medications:  Metformin HCI 500 MG Tablet Twice Daily- (patient states he not taking per instruction from provider)  Gabapentin 100 mg capsule-take 2 capsule by mouth 3 times daily(adequate  supply)  Albuterol 0.83% 25CT SOLN PRN- Patient states he will get this from a company.  One Touch U-Soft Lancet 100-Adequate supply  Nitroglycerin 0.4MG SL Tablet PRN-Adequate Supply  Patient needs refills for Lubiprostone 8 MCG, reach out to PCP to request refill on 12/21/2020. Reached out to PCP on 12/21/2020 to request a new prescription for patient lantus that reflects the new dose change ( On hold for 15 minutes) .  Confirmed delivery date of 12/28/2020, advised patient that pharmacy will contact them the morning of delivery.  [place] Pharmacist Assistant [phone]

## 2020-12-23 ENCOUNTER — Emergency Department (HOSPITAL_COMMUNITY): Payer: Medicare Other

## 2020-12-23 ENCOUNTER — Inpatient Hospital Stay (HOSPITAL_COMMUNITY)
Admission: EM | Admit: 2020-12-23 | Discharge: 2020-12-25 | DRG: 190 | Disposition: A | Discharge status: Home / Self Care | Payer: Medicare Other | Attending: Internal Medicine | Admitting: Internal Medicine

## 2020-12-23 DIAGNOSIS — I10 Essential (primary) hypertension: Secondary | ICD-10-CM | POA: Diagnosis not present

## 2020-12-23 DIAGNOSIS — Z91018 Allergy to other foods: Secondary | ICD-10-CM | POA: Diagnosis not present

## 2020-12-23 DIAGNOSIS — J441 Chronic obstructive pulmonary disease with (acute) exacerbation: Secondary | ICD-10-CM | POA: Diagnosis not present

## 2020-12-23 DIAGNOSIS — Z8249 Family history of ischemic heart disease and other diseases of the circulatory system: Secondary | ICD-10-CM | POA: Diagnosis not present

## 2020-12-23 DIAGNOSIS — Z20822 Contact with and (suspected) exposure to covid-19: Secondary | ICD-10-CM | POA: Diagnosis present

## 2020-12-23 DIAGNOSIS — E1142 Type 2 diabetes mellitus with diabetic polyneuropathy: Secondary | ICD-10-CM | POA: Diagnosis not present

## 2020-12-23 DIAGNOSIS — E785 Hyperlipidemia, unspecified: Secondary | ICD-10-CM | POA: Diagnosis not present

## 2020-12-23 DIAGNOSIS — K219 Gastro-esophageal reflux disease without esophagitis: Secondary | ICD-10-CM | POA: Diagnosis present

## 2020-12-23 DIAGNOSIS — E1122 Type 2 diabetes mellitus with diabetic chronic kidney disease: Secondary | ICD-10-CM

## 2020-12-23 DIAGNOSIS — Z79899 Other long term (current) drug therapy: Secondary | ICD-10-CM | POA: Diagnosis not present

## 2020-12-23 DIAGNOSIS — Z7901 Long term (current) use of anticoagulants: Secondary | ICD-10-CM | POA: Diagnosis not present

## 2020-12-23 DIAGNOSIS — E1165 Type 2 diabetes mellitus with hyperglycemia: Secondary | ICD-10-CM | POA: Diagnosis not present

## 2020-12-23 DIAGNOSIS — G4733 Obstructive sleep apnea (adult) (pediatric): Secondary | ICD-10-CM | POA: Diagnosis not present

## 2020-12-23 DIAGNOSIS — Z87891 Personal history of nicotine dependence: Secondary | ICD-10-CM

## 2020-12-23 DIAGNOSIS — J9621 Acute and chronic respiratory failure with hypoxia: Secondary | ICD-10-CM | POA: Diagnosis present

## 2020-12-23 DIAGNOSIS — E1140 Type 2 diabetes mellitus with diabetic neuropathy, unspecified: Secondary | ICD-10-CM | POA: Diagnosis not present

## 2020-12-23 DIAGNOSIS — R231 Pallor: Secondary | ICD-10-CM | POA: Diagnosis not present

## 2020-12-23 DIAGNOSIS — N179 Acute kidney failure, unspecified: Secondary | ICD-10-CM | POA: Diagnosis not present

## 2020-12-23 DIAGNOSIS — I480 Paroxysmal atrial fibrillation: Secondary | ICD-10-CM | POA: Diagnosis present

## 2020-12-23 DIAGNOSIS — Z833 Family history of diabetes mellitus: Secondary | ICD-10-CM | POA: Diagnosis not present

## 2020-12-23 DIAGNOSIS — J439 Emphysema, unspecified: Secondary | ICD-10-CM | POA: Diagnosis not present

## 2020-12-23 DIAGNOSIS — J80 Acute respiratory distress syndrome: Secondary | ICD-10-CM | POA: Diagnosis not present

## 2020-12-23 DIAGNOSIS — Z7951 Long term (current) use of inhaled steroids: Secondary | ICD-10-CM | POA: Diagnosis not present

## 2020-12-23 DIAGNOSIS — I248 Other forms of acute ischemic heart disease: Secondary | ICD-10-CM | POA: Diagnosis present

## 2020-12-23 DIAGNOSIS — Z841 Family history of disorders of kidney and ureter: Secondary | ICD-10-CM | POA: Diagnosis not present

## 2020-12-23 DIAGNOSIS — I5032 Chronic diastolic (congestive) heart failure: Secondary | ICD-10-CM | POA: Diagnosis not present

## 2020-12-23 DIAGNOSIS — R000 Tachycardia, unspecified: Secondary | ICD-10-CM | POA: Diagnosis not present

## 2020-12-23 DIAGNOSIS — Z794 Long term (current) use of insulin: Secondary | ICD-10-CM | POA: Diagnosis not present

## 2020-12-23 DIAGNOSIS — R0902 Hypoxemia: Secondary | ICD-10-CM | POA: Diagnosis not present

## 2020-12-23 DIAGNOSIS — Z888 Allergy status to other drugs, medicaments and biological substances status: Secondary | ICD-10-CM | POA: Diagnosis not present

## 2020-12-23 DIAGNOSIS — I152 Hypertension secondary to endocrine disorders: Secondary | ICD-10-CM | POA: Diagnosis present

## 2020-12-23 DIAGNOSIS — E1159 Type 2 diabetes mellitus with other circulatory complications: Secondary | ICD-10-CM | POA: Diagnosis present

## 2020-12-23 DIAGNOSIS — Z9981 Dependence on supplemental oxygen: Secondary | ICD-10-CM | POA: Diagnosis not present

## 2020-12-23 DIAGNOSIS — I110 Hypertensive heart disease with heart failure: Secondary | ICD-10-CM | POA: Diagnosis present

## 2020-12-23 DIAGNOSIS — D649 Anemia, unspecified: Secondary | ICD-10-CM | POA: Diagnosis not present

## 2020-12-23 DIAGNOSIS — F418 Other specified anxiety disorders: Secondary | ICD-10-CM | POA: Diagnosis not present

## 2020-12-23 DIAGNOSIS — R0602 Shortness of breath: Secondary | ICD-10-CM | POA: Diagnosis not present

## 2020-12-23 DIAGNOSIS — F1011 Alcohol abuse, in remission: Secondary | ICD-10-CM | POA: Diagnosis present

## 2020-12-23 LAB — BLOOD GAS, VENOUS
Acid-Base Excess: 11.4 mmol/L — ABNORMAL HIGH (ref 0.0–2.0)
Bicarbonate: 37.6 mmol/L — ABNORMAL HIGH (ref 20.0–28.0)
Drawn by: 5694
O2 Saturation: 70.8 %
Patient temperature: 37
pCO2, Ven: 73.8 mmHg (ref 44.0–60.0)
pH, Ven: 7.328 (ref 7.250–7.430)
pO2, Ven: 42 mmHg (ref 32.0–45.0)

## 2020-12-23 LAB — COMPREHENSIVE METABOLIC PANEL
ALT: 39 U/L (ref 0–44)
AST: 26 U/L (ref 15–41)
Albumin: 3.7 g/dL (ref 3.5–5.0)
Alkaline Phosphatase: 91 U/L (ref 38–126)
Anion gap: 6 (ref 5–15)
BUN: 18 mg/dL (ref 6–20)
CO2: 38 mmol/L — ABNORMAL HIGH (ref 22–32)
Calcium: 8.9 mg/dL (ref 8.9–10.3)
Chloride: 90 mmol/L — ABNORMAL LOW (ref 98–111)
Creatinine, Ser: 1.67 mg/dL — ABNORMAL HIGH (ref 0.61–1.24)
GFR, Estimated: 47 mL/min — ABNORMAL LOW (ref >60)
Glucose, Bld: 488 mg/dL — ABNORMAL HIGH (ref 70–99)
Potassium: 5 mmol/L (ref 3.5–5.1)
Sodium: 134 mmol/L — ABNORMAL LOW (ref 135–145)
Total Bilirubin: 0.4 mg/dL (ref 0.3–1.2)
Total Protein: 6.7 g/dL (ref 6.5–8.1)

## 2020-12-23 LAB — CBC WITH DIFFERENTIAL/PLATELET
Abs Immature Granulocytes: 0.05 10*3/uL (ref 0.00–0.07)
Basophils Absolute: 0 10*3/uL (ref 0.0–0.1)
Basophils Relative: 1 %
Eosinophils Absolute: 0 10*3/uL (ref 0.0–0.5)
Eosinophils Relative: 1 %
HCT: 35.8 % — ABNORMAL LOW (ref 39.0–52.0)
Hemoglobin: 10.6 g/dL — ABNORMAL LOW (ref 13.0–17.0)
Immature Granulocytes: 1 %
Lymphocytes Relative: 10 %
Lymphs Abs: 0.8 10*3/uL (ref 0.7–4.0)
MCH: 27.9 pg (ref 26.0–34.0)
MCHC: 29.6 g/dL — ABNORMAL LOW (ref 30.0–36.0)
MCV: 94.2 fL (ref 80.0–100.0)
Monocytes Absolute: 0.5 10*3/uL (ref 0.1–1.0)
Monocytes Relative: 6 %
Neutro Abs: 6.7 10*3/uL (ref 1.7–7.7)
Neutrophils Relative %: 81 %
Platelets: 259 10*3/uL (ref 150–400)
RBC: 3.8 MIL/uL — ABNORMAL LOW (ref 4.22–5.81)
RDW: 13.3 % (ref 11.5–15.5)
WBC: 8.2 10*3/uL (ref 4.0–10.5)
nRBC: 0 % (ref 0.0–0.2)

## 2020-12-23 LAB — I-STAT VENOUS BLOOD GAS, ED
Acid-Base Excess: 8 mmol/L — ABNORMAL HIGH (ref 0.0–2.0)
Bicarbonate: 34.7 mmol/L — ABNORMAL HIGH (ref 20.0–28.0)
Calcium, Ion: 0.97 mmol/L — ABNORMAL LOW (ref 1.15–1.40)
HCT: 27 % — ABNORMAL LOW (ref 39.0–52.0)
Hemoglobin: 9.2 g/dL — ABNORMAL LOW (ref 13.0–17.0)
O2 Saturation: 84 %
Potassium: 4.8 mmol/L (ref 3.5–5.1)
Sodium: 132 mmol/L — ABNORMAL LOW (ref 135–145)
TCO2: 37 mmol/L — ABNORMAL HIGH (ref 22–32)
pCO2, Ven: 61.6 mmHg — ABNORMAL HIGH (ref 44.0–60.0)
pH, Ven: 7.359 (ref 7.250–7.430)
pO2, Ven: 52 mmHg — ABNORMAL HIGH (ref 32.0–45.0)

## 2020-12-23 LAB — TROPONIN I (HIGH SENSITIVITY)
Troponin I (High Sensitivity): 16 ng/L (ref <18)
Troponin I (High Sensitivity): 18 ng/L — ABNORMAL HIGH (ref <18)

## 2020-12-23 LAB — CBC
HCT: 34.4 % — ABNORMAL LOW (ref 39.0–52.0)
Hemoglobin: 10.3 g/dL — ABNORMAL LOW (ref 13.0–17.0)
MCH: 28.1 pg (ref 26.0–34.0)
MCHC: 29.9 g/dL — ABNORMAL LOW (ref 30.0–36.0)
MCV: 93.7 fL (ref 80.0–100.0)
Platelets: 245 10*3/uL (ref 150–400)
RBC: 3.67 MIL/uL — ABNORMAL LOW (ref 4.22–5.81)
RDW: 13.2 % (ref 11.5–15.5)
WBC: 11.6 10*3/uL — ABNORMAL HIGH (ref 4.0–10.5)
nRBC: 0 % (ref 0.0–0.2)

## 2020-12-23 LAB — CBG MONITORING, ED: Glucose-Capillary: 445 mg/dL — ABNORMAL HIGH (ref 70–99)

## 2020-12-23 LAB — BASIC METABOLIC PANEL
Anion gap: 9 (ref 5–15)
BUN: 20 mg/dL (ref 6–20)
CO2: 33 mmol/L — ABNORMAL HIGH (ref 22–32)
Calcium: 9.3 mg/dL (ref 8.9–10.3)
Chloride: 94 mmol/L — ABNORMAL LOW (ref 98–111)
Creatinine, Ser: 1.51 mg/dL — ABNORMAL HIGH (ref 0.61–1.24)
GFR, Estimated: 53 mL/min — ABNORMAL LOW (ref >60)
Glucose, Bld: 234 mg/dL — ABNORMAL HIGH (ref 70–99)
Potassium: 4.5 mmol/L (ref 3.5–5.1)
Sodium: 136 mmol/L (ref 135–145)

## 2020-12-23 LAB — GLUCOSE, CAPILLARY
Glucose-Capillary: 437 mg/dL — ABNORMAL HIGH (ref 70–99)
Glucose-Capillary: 227 mg/dL — ABNORMAL HIGH (ref 70–99)

## 2020-12-23 LAB — HEMOGLOBIN A1C
Hgb A1c MFr Bld: 14.4 % — ABNORMAL HIGH (ref 4.8–5.6)
Mean Plasma Glucose: 366.58 mg/dL

## 2020-12-23 LAB — SARS CORONAVIRUS 2 (TAT 6-24 HRS): SARS Coronavirus 2: NEGATIVE

## 2020-12-23 LAB — BRAIN NATRIURETIC PEPTIDE: B Natriuretic Peptide: 393.8 pg/mL — ABNORMAL HIGH (ref 0.0–100.0)

## 2020-12-23 MED ORDER — ACETAMINOPHEN 650 MG RE SUPP: 650.0000 mg | Freq: Four times a day (QID) | RECTAL | Status: DC | PRN

## 2020-12-23 MED ORDER — HYDRALAZINE HCL 25 MG PO TABS
25.0000 mg | ORAL_TABLET | Freq: Three times a day (TID) | ORAL | Status: DC
  Administered 2020-12-23 – 2020-12-25 (×6): 25 mg via ORAL
  Filled 2020-12-23 (×6): qty 1

## 2020-12-23 MED ORDER — LUBIPROSTONE 8 MCG PO CAPS
8.0000 ug | ORAL_CAPSULE | Freq: Two times a day (BID) | ORAL | Status: DC
  Administered 2020-12-24 – 2020-12-25 (×3): 8 ug via ORAL
  Filled 2020-12-23 (×4): qty 1

## 2020-12-23 MED ORDER — SODIUM CHLORIDE 0.9 % IV SOLN
1.0000 g | INTRAVENOUS | Status: DC
  Administered 2020-12-23 – 2020-12-24 (×2): 1 g via INTRAVENOUS
  Filled 2020-12-23 (×2): qty 10

## 2020-12-23 MED ORDER — SPIRONOLACTONE 25 MG PO TABS
25.0000 mg | ORAL_TABLET | Freq: Every day | ORAL | Status: DC
  Administered 2020-12-24 – 2020-12-25 (×2): 25 mg via ORAL
  Filled 2020-12-23 (×2): qty 1

## 2020-12-23 MED ORDER — ISOSORBIDE MONONITRATE ER 30 MG PO TB24
30.0000 mg | ORAL_TABLET | Freq: Every day | ORAL | Status: DC
  Administered 2020-12-24 – 2020-12-25 (×2): 30 mg via ORAL
  Filled 2020-12-23 (×2): qty 1

## 2020-12-23 MED ORDER — CLOTRIMAZOLE 10 MG MT TROC
10.0000 mg | Freq: Every day | OROMUCOSAL | Status: DC
  Administered 2020-12-24 – 2020-12-25 (×8): 10 mg via ORAL
  Filled 2020-12-23 (×13): qty 1

## 2020-12-23 MED ORDER — UMECLIDINIUM BROMIDE 62.5 MCG/INH IN AEPB
1.0000 | INHALATION_SPRAY | Freq: Every day | RESPIRATORY_TRACT | Status: DC
  Administered 2020-12-24 – 2020-12-25 (×2): 1 via RESPIRATORY_TRACT
  Filled 2020-12-23: qty 7

## 2020-12-23 MED ORDER — ACETAMINOPHEN 325 MG PO TABS
650.0000 mg | ORAL_TABLET | Freq: Four times a day (QID) | ORAL | Status: DC | PRN
  Administered 2020-12-24: 650 mg via ORAL
  Filled 2020-12-23: qty 2

## 2020-12-23 MED ORDER — FAMOTIDINE 20 MG PO TABS
20.0000 mg | ORAL_TABLET | Freq: Every day | ORAL | Status: DC
  Administered 2020-12-24: 20 mg via ORAL
  Filled 2020-12-23: qty 1

## 2020-12-23 MED ORDER — FLUTICASONE-UMECLIDIN-VILANT 100-62.5-25 MCG/INH IN AEPB: 1.0000 | INHALATION_SPRAY | Freq: Every day | RESPIRATORY_TRACT | Status: DC

## 2020-12-23 MED ORDER — ROFLUMILAST 500 MCG PO TABS
250.0000 ug | ORAL_TABLET | Freq: Every day | ORAL | Status: DC
  Administered 2020-12-24 – 2020-12-25 (×2): 250 ug via ORAL
  Filled 2020-12-23 (×2): qty 1

## 2020-12-23 MED ORDER — AMLODIPINE BESYLATE 5 MG PO TABS
5.0000 mg | ORAL_TABLET | Freq: Every morning | ORAL | Status: DC
  Administered 2020-12-24 – 2020-12-25 (×2): 5 mg via ORAL
  Filled 2020-12-23 (×2): qty 1

## 2020-12-23 MED ORDER — FUROSEMIDE 40 MG PO TABS
40.0000 mg | ORAL_TABLET | Freq: Two times a day (BID) | ORAL | Status: DC
  Administered 2020-12-24 – 2020-12-25 (×3): 40 mg via ORAL
  Filled 2020-12-23 (×3): qty 1

## 2020-12-23 MED ORDER — ARIPIPRAZOLE 5 MG PO TABS
5.0000 mg | ORAL_TABLET | Freq: Every day | ORAL | Status: DC
  Administered 2020-12-24 – 2020-12-25 (×2): 5 mg via ORAL
  Filled 2020-12-23 (×2): qty 1

## 2020-12-23 MED ORDER — ALBUTEROL SULFATE (2.5 MG/3ML) 0.083% IN NEBU
2.5000 mg | INHALATION_SOLUTION | RESPIRATORY_TRACT | Status: DC | PRN
  Administered 2020-12-24: 2.5 mg via RESPIRATORY_TRACT

## 2020-12-23 MED ORDER — PREDNISONE 20 MG PO TABS
40.0000 mg | ORAL_TABLET | Freq: Every day | ORAL | Status: DC
  Filled 2020-12-23: qty 2

## 2020-12-23 MED ORDER — FLUTICASONE FUROATE-VILANTEROL 100-25 MCG/INH IN AEPB
1.0000 | INHALATION_SPRAY | Freq: Every day | RESPIRATORY_TRACT | Status: DC
  Administered 2020-12-24 – 2020-12-25 (×2): 1 via RESPIRATORY_TRACT
  Filled 2020-12-23: qty 28

## 2020-12-23 MED ORDER — ROSUVASTATIN CALCIUM 20 MG PO TABS
20.0000 mg | ORAL_TABLET | Freq: Every day | ORAL | Status: DC
  Administered 2020-12-24 – 2020-12-25 (×2): 20 mg via ORAL
  Filled 2020-12-23 (×2): qty 1

## 2020-12-23 MED ORDER — ALBUTEROL (5 MG/ML) CONTINUOUS INHALATION SOLN
15.0000 mg/h | INHALATION_SOLUTION | Freq: Once | RESPIRATORY_TRACT | Status: AC
  Administered 2020-12-23: 15 mg/h via RESPIRATORY_TRACT

## 2020-12-23 MED ORDER — CARVEDILOL 12.5 MG PO TABS
12.5000 mg | ORAL_TABLET | Freq: Two times a day (BID) | ORAL | Status: DC
  Administered 2020-12-24 – 2020-12-25 (×3): 12.5 mg via ORAL
  Filled 2020-12-23 (×3): qty 1

## 2020-12-23 MED ORDER — ONDANSETRON HCL 4 MG PO TABS: 4.0000 mg | ORAL_TABLET | Freq: Four times a day (QID) | ORAL | Status: DC | PRN

## 2020-12-23 MED ORDER — INSULIN GLARGINE 100 UNIT/ML ~~LOC~~ SOLN
55.0000 [IU] | Freq: Every day | SUBCUTANEOUS | Status: DC
  Administered 2020-12-24 (×2): 55 [IU] via SUBCUTANEOUS
  Filled 2020-12-23 (×4): qty 0.55

## 2020-12-23 MED ORDER — BUSPIRONE HCL 5 MG PO TABS
15.0000 mg | ORAL_TABLET | Freq: Two times a day (BID) | ORAL | Status: DC
  Administered 2020-12-23 – 2020-12-25 (×4): 15 mg via ORAL
  Filled 2020-12-23 (×4): qty 3

## 2020-12-23 MED ORDER — INSULIN ASPART 100 UNIT/ML ~~LOC~~ SOLN
20.0000 [IU] | Freq: Once | SUBCUTANEOUS | Status: AC
  Administered 2020-12-23: 20 [IU] via SUBCUTANEOUS

## 2020-12-23 MED ORDER — METHYLPREDNISOLONE SODIUM SUCC 125 MG IJ SOLR
60.0000 mg | Freq: Three times a day (TID) | INTRAMUSCULAR | Status: AC
  Administered 2020-12-24 (×3): 60 mg via INTRAVENOUS
  Filled 2020-12-23 (×4): qty 2

## 2020-12-23 MED ORDER — PANTOPRAZOLE SODIUM 40 MG PO TBEC
40.0000 mg | DELAYED_RELEASE_TABLET | Freq: Every morning | ORAL | Status: DC
  Administered 2020-12-24 – 2020-12-25 (×2): 40 mg via ORAL
  Filled 2020-12-23 (×2): qty 1

## 2020-12-23 MED ORDER — ONDANSETRON HCL 4 MG/2ML IJ SOLN: 4.0000 mg | Freq: Four times a day (QID) | INTRAMUSCULAR | Status: DC | PRN

## 2020-12-23 MED ORDER — APIXABAN 5 MG PO TABS
5.0000 mg | ORAL_TABLET | Freq: Two times a day (BID) | ORAL | Status: DC
  Administered 2020-12-23 – 2020-12-25 (×4): 5 mg via ORAL
  Filled 2020-12-23 (×4): qty 1

## 2020-12-23 MED ORDER — ALBUTEROL SULFATE (2.5 MG/3ML) 0.083% IN NEBU
2.5000 mg | INHALATION_SOLUTION | Freq: Four times a day (QID) | RESPIRATORY_TRACT | Status: DC
  Administered 2020-12-23 – 2020-12-24 (×3): 2.5 mg via RESPIRATORY_TRACT
  Filled 2020-12-23 (×3): qty 3

## 2020-12-23 MED ORDER — INSULIN ASPART 100 UNIT/ML ~~LOC~~ SOLN
0.0000 [IU] | Freq: Three times a day (TID) | SUBCUTANEOUS | Status: DC
  Administered 2020-12-24: 5 [IU] via SUBCUTANEOUS
  Administered 2020-12-24: 8 [IU] via SUBCUTANEOUS
  Administered 2020-12-24 – 2020-12-25 (×2): 5 [IU] via SUBCUTANEOUS

## 2020-12-23 MED ORDER — GUAIFENESIN ER 600 MG PO TB12
600.0000 mg | ORAL_TABLET | Freq: Two times a day (BID) | ORAL | Status: DC
  Administered 2020-12-23 – 2020-12-25 (×4): 600 mg via ORAL
  Filled 2020-12-23 (×4): qty 1

## 2020-12-23 MED ORDER — GABAPENTIN 100 MG PO CAPS
200.0000 mg | ORAL_CAPSULE | Freq: Three times a day (TID) | ORAL | Status: DC
  Administered 2020-12-23 – 2020-12-25 (×6): 200 mg via ORAL
  Filled 2020-12-23 (×6): qty 2

## 2020-12-23 MED ORDER — FLUOXETINE HCL 20 MG PO CAPS
60.0000 mg | ORAL_CAPSULE | Freq: Every day | ORAL | Status: DC
  Administered 2020-12-24 – 2020-12-25 (×2): 60 mg via ORAL
  Filled 2020-12-23 (×2): qty 3

## 2020-12-23 MED ORDER — IPRATROPIUM BROMIDE 0.02 % IN SOLN
0.5000 mg | Freq: Once | RESPIRATORY_TRACT | Status: AC
  Administered 2020-12-23: 0.5 mg via RESPIRATORY_TRACT
  Filled 2020-12-23: qty 2.5

## 2020-12-23 NOTE — ED Triage Notes (Signed)
Pt arrived to ED via EMS from home. Pt began with increased SOB at 0500 this am, worsening at 1000. Pt took nitro and increased his home at 3l without relief. Upon ems arrival pt was tachypnea, hypertensive and labored breathing. Pt was placed on CPAP 125mg  solu, 2 grams of mag, 10 mg of albuterol given. Pt improved en route and vital signs stabilized.

## 2020-12-23 NOTE — Progress Notes (Signed)
Patient came in via EMS on a CPAP, placed patient on Servo U on non-invasive mode PCV 10/5 40% with a back up rate of 15 BPM, patient tolerated well, SATS 100%, MD at bedside and aware, will continue to monitor patient.

## 2020-12-23 NOTE — H&P (Signed)
History and Physical    Kenneth [name]:[id number] DOB: 1961-08-03 DOA: 12/23/2020  Referring MD/NP/PA: Blanchie Dessert, MD PCP: [name], MD  Patient coming from: Via EMS  Chief Complaint: Shortness of breath  I have personally briefly reviewed patient's old medical records in [place]   HPI: Kenneth [name] is a 60 y.o. male with medical history significant of HTN, HLD, PAF on Eliquis, CHF last EF 55-60%, DM type II, COPD, chronic respiratory failure on 2-2.5 L oxygen, OSA, and depression/depression who presented with complaints of shortness of breath.  He thinks symptoms started yesterday evening when his fiance was cooking food in the oven.  Patient notes that he began having difficulty breathing at that time.  Tried cutting on the pain, but it provided no relief.  Overnight patient symptoms worsened despite him trying back-to-back breathing treatments at home and increasing his oxygen to 3 L.  Reports having associated symptoms of a nonproductive cough and wheezing.  He had quit smoking altogether approximately 4 months ago and reports that he also has not drink any alcohol in a couple months as well.  Denies having any fever, chest pain, nausea, vomiting, diarrhea, blood in stools, leg swelling, or calf pain.  Patient has been compliant with all of his medications at home including anticoagulation.  In route with EMS patient was placed on CPAP due to work of breathing given 125 mg of Solu-Medrol, 2 g of magnesium sulfate, and 10 mg of albuterol prior to arrival.  Of note patient had been admitted into the hospital 1/2-1/3 for COPD exacerbation, 1/12-1/22 after PEA arrest requiring intubation with COPD and congestive heart failure exacerbation, and lastly from 1/31-2/2 with a COPD exacerbation  ED Course: Upon admission into the emergency department patient was seen to be afebrile with respirations 16-23, and O2 saturations currently maintained on BiPAP.  Labs significant for  hemoglobin 10.6, sodium 134, BUN 18, creatinine 1.67, glucose 488, BNP 393.8, troponin XVI.  Chest x-ray showed no acute abnormality and emphysematous changes.  Review of Systems  Constitutional: Negative for fever and weight loss.  HENT: Negative for congestion and sinus pain.   Eyes: Negative for photophobia and pain.  Respiratory: Positive for cough, shortness of breath and wheezing.   Cardiovascular: Negative for chest pain and leg swelling.  Gastrointestinal: Negative for abdominal pain and diarrhea.  Genitourinary: Negative for dysuria and hematuria.  Musculoskeletal: Positive for joint pain. Negative for falls and myalgias.  Skin: Negative for rash.  Neurological: Negative for focal weakness and loss of consciousness.  Psychiatric/Behavioral: Negative for substance abuse. The patient has insomnia.     Past Medical History:  Diagnosis Date  . Anxiety   . Arthritis   . Atrial fibrillation ([place])   . Chest tightness 06/12/2019  . CHF (congestive heart failure) ([place])   . COPD (chronic obstructive pulmonary disease) ([facility])    emphysema  . Depression   . Diabetes mellitus without complication ([place])   . Heart murmur   . Hyperlipidemia   . Hypertension   . Sleep apnea with use of continuous positive airway pressure (CPAP)     Past Surgical History:  Procedure Laterality Date  . NO PAST SURGERIES       reports that he quit smoking about 19 months ago. His smoking use included cigarettes. He has a 60.00 pack-year smoking history. He has never used smokeless tobacco. He reports current alcohol use. He reports current drug use. Drug: Marijuana.  Allergies  Allergen Reactions  . Lisinopril  Swelling  . Other Other (See Comments)    Lettuce : rash  . Tomato Rash    Family History  Problem Relation Age of Onset  . Diabetes Mother   . Heart disease Mother   . Diabetes Father   . Heart disease Father   . Diabetes Sister   . Heart disease Sister   . Kidney disease Sister   .  Coronary artery disease Brother   . Liver disease Maternal Uncle     Prior to Admission medications   Medication Sig Start Date End Date Taking? Authorizing Provider  acetaminophen (TYLENOL) 325 MG tablet Take 650 mg by mouth every 6 (six) hours as needed for mild pain or headache.   Yes [provider]  albuterol (PROVENTIL) (2.5 MG/3ML) 0.083% nebulizer solution INHALE 1 VIAL VIA NEBULIZER 5 TIMES DAILY AS NEEDED FOR WHEEZING OR FOR SHORTNESS OF BREATH Patient taking differently: Take 2.5 mg by nebulization every 6 (six) hours as needed for shortness of breath. 10/13/20  Yes Rizwan, Eunice Blase, MD  amLODipine (NORVASC) 5 MG tablet Take 5 mg by mouth every morning. 10/30/20  Yes [provider]  ARIPiprazole (ABILIFY) 5 MG tablet Take 5 mg by mouth daily. 08/24/20  Yes [provider]  busPIRone (BUSPAR) 15 MG tablet Take 1 tablet (15 mg total) by mouth 2 (two) times daily. 09/06/19  Yes [name], MD  carvedilol (COREG) 12.5 MG tablet Take 1 tablet (12.5 mg total) by mouth 2 (two) times daily with a meal. 04/23/20  Yes Buford Dresser, MD  clotrimazole (MYCELEX) 10 MG troche Take 1 tablet (10 mg total) by mouth 5 (five) times daily. 12/07/20  Yes Parrett, Tammy S, NP  DM-GG & DM-APAP-CPM (CORICIDIN HBP DAY/NIGHT COLD) 10-20 &15-200-2 MG MISC Take 1 tablet by mouth every 12 (twelve) hours as needed (cold symptoms).   Yes [provider]  ELIQUIS 5 MG TABS tablet TAKE ONE TABLET BY MOUTH EVERY MORNING and TAKE ONE TABLET BY MOUTH EVERY EVENING Patient taking differently: Take 5 mg by mouth 2 (two) times daily. 10/22/20  Yes Buford Dresser, MD  famotidine (PEPCID) 20 MG tablet Take 20 mg by mouth daily.   Yes [provider]  FLUoxetine (PROZAC) 20 MG capsule TAKE 3 CAPSULES BY MOUTH  DAILY 05/26/19  Yes [name], MD  Fluticasone-Umeclidin-Vilant (TRELEGY ELLIPTA) 100-62.5-25 MCG/INH AEPB Inhale 1 puff into the lungs daily.  04/02/20  Yes Collene Gobble, MD  furosemide (LASIX) 40 MG tablet Take 1 tablet (40 mg total) by mouth 2 (two) times daily. 08/02/20  Yes Buford Dresser, MD  gabapentin (NEURONTIN) 100 MG capsule Take 2 capsules (200 mg total) by mouth 3 (three) times daily. 11/22/20  Yes [name], MD  guaiFENesin (MUCINEX) 600 MG 12 hr tablet Take 1 tablet (600 mg total) by mouth 2 (two) times daily. 09/24/20  Yes Regalado, Belkys A, MD  hydrALAZINE (APRESOLINE) 25 MG tablet Take 1 tablet (25 mg total) by mouth 3 (three) times daily. 11/23/20 05/22/21 Yes RuddLillette Boxer, MD  insulin glargine (LANTUS SOLOSTAR) 100 UNIT/ML Solostar Pen Inject 55 Units into the skin at bedtime. If fasting blood sugars remain >150 for 1 week, increase insulin dose by 5 units. Maximum of 80 units. 12/19/20  Yes [name], MD  ipratropium-albuterol (DUONEB) 0.5-2.5 (3) MG/3ML SOLN Take 3 mLs by nebulization every 6 (six) hours as needed. Patient taking differently: Take 3 mLs by nebulization every 6 (six) hours as needed (For shortness of  breath). 09/24/20  Yes Regalado, Belkys A, MD  isosorbide mononitrate (IMDUR) 30 MG 24 hr tablet Take 30 mg by mouth daily. 07/04/20  Yes [provider]  lubiprostone ([name]) 8 MCG capsule Take 1 capsule (8 mcg total) by mouth 2 (two) times daily with a meal. 12/21/20  Yes [name], [name], MD  Multiple Vitamin (MULTIVITAMIN WITH MINERALS) TABS tablet Take 1 tablet by mouth daily.   Yes [provider]  nitroGLYCERIN (NITROSTAT) 0.4 MG SL tablet DISSOLVE 1 TABLET UNDER THE TONGUE EVERY 5 MINUTES AS&nbsp;&nbsp;NEEDED FOR CHEST PAIN. MAX&nbsp;&nbsp;OF 3 TABLETS IN 15 MINUTES. CALL 911 IF PAIN PERSISTS. Patient taking differently: Place 0.4 mg under the tongue every 5 (five) minutes as needed for chest pain. 07/25/20  Yes [name], MD  nystatin (MYCOSTATIN) 100000 UNIT/ML suspension Take 5 mLs (500,000 Units total) by mouth 4 (four) times daily. 12/07/20  Yes Parrett,  Tammy S, NP  pantoprazole (PROTONIX) 40 MG tablet TAKE ONE TABLET BY MOUTH EVERY MORNING 10/29/20  Yes Esterwood, Amy S, PA-C  potassium chloride SA (KLOR-CON) 20 MEQ tablet Take 2 tablets (40 mEq total) by mouth daily. 11/23/20 05/22/21 Yes RuddLillette Boxer, MD  predniSONE (DELTASONE) 10 MG tablet Take 1 tablet (10 mg total) by mouth daily with breakfast. 10/22/20  Yes Parrett, Tammy S, NP  Roflumilast (DALIRESP) 250 MCG TABS Take 250 mcg by mouth daily. 11/06/20  Yes Parrett, Tammy S, NP  rosuvastatin (CRESTOR) 20 MG tablet TAKE ONE TABLET BY MOUTH ONCE DAILY 10/29/20  Yes Dutch Quint B, FNP  spironolactone (ALDACTONE) 25 MG tablet Take 1 tablet (25 mg total) by mouth daily. 10/24/20 11/23/20 Yes Ghimire, Dante Gang, MD  Blood Glucose Monitoring Suppl (ONE TOUCH ULTRA 2) w/Device KIT Use to test blood sugars 1-2 times daily. 04/02/20   [name], MD  glucose blood [facility] ULTRA) test strip USE TO TEST BLOOD SUGAR 1-2 TIMES DAILY 05/22/20   [name], MD  Insulin Pen Needle (PEN NEEDLES) 31G X 6 MM MISC 1 application by Does not apply route at bedtime. 11/03/20   Nche, Charlene Brooke, NP  Lancets [facility] ULTRASOFT) lancets USE TO TEST BLOOD SUGAR 1-2 TIMES DAILY 05/22/20   [name], MD    Physical Exam:  Constitutional: Middle-age male who appears to be in some respiratory distress Vitals:   12/23/20 1245 12/23/20 1300 12/23/20 1313 12/23/20 1345  BP: (!) 152/82 (!) 150/81  (!) 150/84  Pulse: 84 81  87  Resp: _0 Temp:      TempSrc:      SpO2: 100% 100% 100% 100%   Eyes: PERRL, lids and conjunctivae normal ENMT: Mucous membranes are moist. Posterior pharynx clear of any exudate or lesions.  Neck: normal, supple, no masses, no thyromegaly Respiratory: Decreased overall aeration with positive expiratory wheezes appreciated.  Patient currently on 6 L nasal cannula oxygen with O2 saturations maintained. Cardiovascular: Regular rate and rhythm, no murmurs / rubs  / gallops. No extremity edema. 2+ pedal pulses. No carotid bruits.  Abdomen: no tenderness, no masses palpated. No hepatosplenomegaly. Bowel sounds positive.  Musculoskeletal: no clubbing / cyanosis. No joint deformity upper and lower extremities. Good ROM, no contractures. Normal muscle tone.  Skin: no rashes, lesions, ulcers. No induration Neurologic: CN 2-12 grossly intact. Sensation intact, DTR normal. Strength 5/5 in all 4.  Psychiatric: Normal judgment and insight. Alert and oriented x 3. Normal mood.     Labs on Admission: I have personally reviewed following  labs and imaging studies  CBC: Recent Labs  Lab 12/23/20 1203  WBC 8.2  NEUTROABS 6.7  HGB 10.6*  HCT 35.8*  MCV 94.2  PLT 202   Basic Metabolic Panel: Recent Labs  Lab 12/23/20 1203  NA 134*  K 5.0  CL 90*  CO2 38*  GLUCOSE 488*  BUN 18  CREATININE 1.67*  CALCIUM 8.9   GFR: Estimated Creatinine Clearance: 52.3 mL/min (A) (by C-G formula based on SCr of 1.67 mg/dL (H)). Liver Function Tests: Recent Labs  Lab 12/23/20 1203  AST 26  ALT 39  ALKPHOS 91  BILITOT 0.4  PROT 6.7  ALBUMIN 3.7   No results for input(s): LIPASE, AMYLASE in the last 168 hours. No results for input(s): AMMONIA in the last 168 hours. Coagulation Profile: No results for input(s): INR, PROTIME in the last 168 hours. Cardiac Enzymes: No results for input(s): CKTOTAL, CKMB, CKMBINDEX, TROPONINI in the last 168 hours. BNP (last 3 results) No results for input(s): PROBNP in the last 8760 hours. HbA1C: No results for input(s): HGBA1C in the last 72 hours. CBG: No results for input(s): GLUCAP in the last 168 hours. Lipid Profile: No results for input(s): CHOL, HDL, LDLCALC, TRIG, CHOLHDL, LDLDIRECT in the last 72 hours. Thyroid Function Tests: No results for input(s): TSH, T4TOTAL, FREET4, T3FREE, THYROIDAB in the last 72 hours. Anemia Panel: No results for input(s): VITAMINB12, FOLATE, FERRITIN, TIBC, IRON, RETICCTPCT in the  last 72 hours. Urine analysis:    Component Value Date/Time   COLORURINE AMBER (A) 10/03/2020 2036   APPEARANCEUR CLOUDY (A) 10/03/2020 2036   LABSPEC 1.016 10/03/2020 2036   PHURINE 6.0 10/03/2020 2036   GLUCOSEU >=500 (A) 10/03/2020 2036   GLUCOSEU NEGATIVE 05/20/2019 0825   HGBUR NEGATIVE 10/03/2020 2036   BILIRUBINUR NEGATIVE 10/03/2020 2036   KETONESUR NEGATIVE 10/03/2020 2036   PROTEINUR >=300 (A) 10/03/2020 2036   UROBILINOGEN 1.0 05/20/2019 0825   NITRITE NEGATIVE 10/03/2020 2036   LEUKOCYTESUR NEGATIVE 10/03/2020 2036   Sepsis Labs: No results found for this or any previous visit (from the past 240 hour(s)).   Radiological Exams on Admission: DG Chest Port 1 View  Result Date: 12/23/2020 CLINICAL DATA:  Shortness of breath. EXAM: PORTABLE CHEST 1 VIEW COMPARISON:  Radiographs 10/22/2020.  CT 10/03/2020. FINDINGS: 1221 hours. Two views obtained. The heart size and mediastinal contours are stable with aortic atherosclerosis. The lungs are hyperinflated but clear. There is no pleural effusion or pneumothorax. Evidence of healing anterior rib fractures bilaterally. No acute osseous findings. Telemetry leads overlie the chest. IMPRESSION: No evidence of acute cardiopulmonary process. Healing rib fractures bilaterally. Emphysema. Electronically Signed   By: Richardean Sale M.D.   On: 12/23/2020 12:44    EKG: Independently reviewed. Atrial fibrillation at 93 bpm versus sinus rhythm  Assessment/Plan Acute on chronic respiratory failure with hypoxia secondary to COPD exacerbation: Presents with progressively worsening shortness of breath with cough and wheezing.  Normally require at least 2-2.5 liters of oxygen at baseline.  He was placed on CPAP with EMS due to work of breathing and had been transitioned to BiPAP due to significant work of breathing.  Chest x-ray noted no acute cardiopulmonary process, healing rib fractures, and emphysema  Venous pH significant for PCO2 of 61.  Patient  was able to be taken off of BiPAP and transitioned to  nasal cannula oxygen. -Admit to a progressive bed -COPD order set utilized -Continuous pulse oximetry with nasal cannula oxygen maintain O2 saturation greater than 92%  -BiPAP  as needed -Albuterol nebs 4 times daily every 2 hours as needed for shortness of breath/wheeze -Continue Trelegy Ellipta and Daliresp -Solu-Medrol 60 mg IV every 8 hours x3 doses, then possibly transition to prednisone p.o. -Rocephin IV -Mucinex  Diabetes mellitus type 2 with hyperglycemia: Last hemoglobin A1c 6.3 on 09/13/2020.  Patient presents with glucose elevated up to 488, but likely secondary to her being given IV Solu-Medrol prior to arrival.  Home medication regimen includes Lantus 55 units nightly. -Hypoglycemic protocols -Continue Lantus 55 units  -CBGs q. before meals and at bedtime with moderate SSI -Continue gabapentin  Paroxysmal atrial fibrillation on chronic anticoagulation: Patient currently appears to be rate controlled. -Continue Eliquis and beta-blocker  Diastolic congestive heart failure: Patient appears to be euvolemic at this time.  BNP 393.8 which is significantly lower than previous admissions.  Chest x-ray showed no signs of edema. -Strict I&Os and daily weights -Continue beta-blocker and diuretics per home regimen  Normocytic anemia: Chronic.  Hemoglobin 10.9 which appears near patient's baseline.  Patient denies any reports of bleeding -Continue to monitor  Essential hypertension: On admission blood pressure elevated up to 165/83. Home blood pressure medications include amlodipine 5 mg daily, Coreg 12.5 mg twice daily, furosemide 40 mg twice daily, isosorbide mononitrate 30 mg daily, and Aldactone 25 mg daily. -Continue home regimen as tolerated  Anxiety/depression -Continue Abilify, Prozac, and BuSpar  Hyperlipidemia -Continue Crestor  OSA -Continue BiPAP at night  History of tobacco and alcohol abuse: Patient reports  that he has not smoked or drank any alcohol in several months now.  DVT prophylaxis: Eliquis Code Status: Full Family Communication: Fianc updated at bedside Disposition Plan: Hopefully discharge home in 2 days. Consults called: None Admission status: Inpatient, require more than 2 midnight stay due to respiratory failure  Norval Morton MD [facility]   If 7PM-7AM, please contact night-coverage   12/23/2020, 2:09 PM

## 2020-12-23 NOTE — ED Notes (Signed)
Respiratory therapist  at bedside pt switched to BiPAP 10/5, 40% and rr 15

## 2020-12-23 NOTE — Progress Notes (Signed)
[facility] night shift.  The staff reported that the patient's CBG on arrival to the progressive unit at 2035 was 437 mg/dL.  Earlier today, at 31, his CBG was 445 mg/dL and he received 20 units of NovoLog SQ.  He then ate dinner shortly after that.  The patient was admitted for acute on chronic respiratory failure with hypoxia secondary to COPD exacerbation.  He received methylprednisolone earlier and is a scheduled to continue glucocorticoids while hospitalized.  NovoLog 20 units SQ x1 dose ordered.  The patient is scheduled to have Lantus 55 units SQ at bedtime.  Sanda Klein, MD.

## 2020-12-23 NOTE — ED Notes (Signed)
Pt request bipap come off, Dr Anitra Lauth aware and agrees. Bipap removed and pt placed on nasal canula

## 2020-12-23 NOTE — ED Notes (Signed)
Dr Katrinka Blazing aware of pt CBG, order placed in Epic

## 2020-12-23 NOTE — ED Provider Notes (Addendum)
[facility] Provider Note   CSN: [id number] Arrival date & time: 12/23/20  1155     History Chief Complaint  Patient presents with  . Shortness of Breath    Kenneth [name] is a 60 y.o. male.  Patient is a 60 year old male with a history of COPD on chronic 2 L at home, CHF, atrial fibrillation, diabetes, hypertension who stopped smoking approximately 4 months ago who is presenting today with paramedics due to severe shortness of breath.  Patient reports that last night he started feeling short of breath with more coughing.  However at 5 AM this morning it became worse.  He had turned his oxygen up to 4 L at home but was still unable to catch his breath.  He denies any recent lower extremity swelling but noticed his abdomen felt a little bit tight today.  He was having some chest tightness prior to EMS arrival and took 2 nitroglycerin and denies any chest pain at this time.  EMS reports when they arrived patient was tripoding with sats in the low 90s on 4 L and in acute distress.  He was given albuterol 15 mg, 1 round of Atrovent, Solu-Medrol, magnesium and placed on cPAP.  Shortly after being put on CPAP patient started to improve.  Currently patient reports he feels much better and his breathing is much more comfortable.  He is currently denying any chest pain or abdominal pain.  He denies any recent fever, abdominal pain, nausea or vomiting.  He has been taking his medications at home as prescribed.  The history is provided by the patient, the EMS personnel and medical records.  Shortness of Breath      Past Medical History:  Diagnosis Date  . Anxiety   . Arthritis   . Atrial fibrillation ([place])   . Chest tightness 06/12/2019  . CHF (congestive heart failure) ([place])   . COPD (chronic obstructive pulmonary disease) ([facility])    emphysema  . Depression   . Diabetes mellitus without complication ([place])   . Heart murmur   . Hyperlipidemia   . Hypertension    . Sleep apnea with use of continuous positive airway pressure (CPAP)     Patient Active Problem List   Diagnosis Date Noted  . Chronic respiratory failure with hypoxia ([place]) 12/07/2020  . Oral candidiasis 12/07/2020  . Acute on chronic respiratory failure with hypoxia ([place]) 10/23/2020  . Cardiac arrest ([place]) 10/04/2020  . Acute kidney injury superimposed on CKD ([place]) 09/23/2020  . Thiamine deficiency 07/02/2020  . Obstructive sleep apnea 05/02/2020  . Hyperlipidemia   . Acute on chronic combined systolic (congestive) and diastolic (congestive) heart failure ([place]) 04/07/2020  . Chronic constipation 07/19/2019  . Snoring 03/29/2019  . Insomnia due to other mental disorder 11/15/2018  . Anticoagulant long-term use 10/21/2018  . Gastroesophageal reflux disease without esophagitis 08/10/2018  . Tobacco abuse 08/10/2018  . Chest pain 07/27/2018  . Essential hypertension 07/06/2018  . Chronic obstructive pulmonary disease ([place]) 07/06/2018  . Alcohol abuse 07/06/2018  . Paroxysmal atrial fibrillation ([place]) 11/28/2017  . Slow transit constipation 06/07/2016  . Chronic anemia 06/06/2016  . Hypertensive kidney disease with chronic kidney disease stage III ([place]) 06/06/2016  . Mixed anxiety and depressive disorder 06/05/2016  . Type 2 diabetes mellitus with diabetic neuropathy, with long-term current use of insulin ([place]) 06/05/2016  . Chronic obstructive pulmonary disease with (acute) exacerbation ([place]) 06/05/2016    Past Surgical History:  Procedure Laterality Date  . NO  PAST SURGERIES         Family History  Problem Relation Age of Onset  . Diabetes Mother   . Heart disease Mother   . Diabetes Father   . Heart disease Father   . Diabetes Sister   . Heart disease Sister   . Kidney disease Sister   . Coronary artery disease Brother   . Liver disease Maternal Uncle     Social History   Tobacco Use  . Smoking status: Former Smoker    Packs/day: 2.00    Years: 30.00     Pack years: 60.00    Types: Cigarettes    Quit date: 05/01/2019    Years since quitting: 1.6  . Smokeless tobacco: Never Used  . Tobacco comment: smokes 2-3cigs per day  Vaping Use  . Vaping Use: Never used  Substance Use Topics  . Alcohol use: Yes    Comment: patient reports he drinks boubon 1-2 per month  . Drug use: Yes    Types: Marijuana    Comment: occ marijuana    Home Medications Prior to Admission medications   Medication Sig Start Date End Date Taking? Authorizing Provider  acetaminophen (TYLENOL) 325 MG tablet Take 650 mg by mouth every 6 (six) hours as needed for mild pain or headache.    [provider]  albuterol (PROVENTIL) (2.5 MG/3ML) 0.083% nebulizer solution INHALE 1 VIAL VIA NEBULIZER 5 TIMES DAILY AS NEEDED FOR WHEEZING OR FOR SHORTNESS OF BREATH Patient taking differently: Take 2.5 mg by nebulization every 6 (six) hours as needed for shortness of breath. 10/13/20   Debbe Odea, MD  amLODipine (NORVASC) 5 MG tablet Take 5 mg by mouth every morning. 10/30/20   [provider]  ARIPiprazole (ABILIFY) 5 MG tablet Take 5 mg by mouth daily. 08/24/20   [provider]  Blood Glucose Monitoring Suppl (ONE TOUCH ULTRA 2) w/Device KIT Use to test blood sugars 1-2 times daily. 04/02/20   [name], MD  busPIRone (BUSPAR) 15 MG tablet Take 1 tablet (15 mg total) by mouth 2 (two) times daily. 09/06/19   [name], MD  carvedilol (COREG) 12.5 MG tablet Take 1 tablet (12.5 mg total) by mouth 2 (two) times daily with a meal. 04/23/20   Buford Dresser, MD  clotrimazole (MYCELEX) 10 MG troche Take 1 tablet (10 mg total) by mouth 5 (five) times daily. 12/07/20   Parrett, Fonnie Mu, NP  DM-GG & DM-APAP-CPM (CORICIDIN HBP DAY/NIGHT COLD) 10-20 &15-200-2 MG MISC Take 1 tablet by mouth every 12 (twelve) hours as needed (cold symptoms).    [provider]  ELIQUIS 5 MG TABS tablet TAKE ONE TABLET BY MOUTH EVERY MORNING and TAKE  ONE TABLET BY MOUTH EVERY EVENING 10/22/20   Buford Dresser, MD  famotidine (PEPCID) 20 MG tablet Take 20 mg by mouth daily.    [provider]  FLUoxetine (PROZAC) 20 MG capsule TAKE 3 CAPSULES BY MOUTH  DAILY 05/26/19   [name], MD  Fluticasone-Umeclidin-Vilant (TRELEGY ELLIPTA) 100-62.5-25 MCG/INH AEPB Inhale 1 puff into the lungs daily. 04/02/20   Collene Gobble, MD  furosemide (LASIX) 40 MG tablet Take 1 tablet (40 mg total) by mouth 2 (two) times daily. 08/02/20   Buford Dresser, MD  gabapentin (NEURONTIN) 100 MG capsule Take 2 capsules (200 mg total) by mouth 3 (three) times daily. 11/22/20   [name], MD  glucose blood (ONETOUCH ULTRA) test strip USE TO TEST BLOOD SUGAR 1-2 TIMES DAILY  05/22/20   [name], MD  guaiFENesin (MUCINEX) 600 MG 12 hr tablet Take 1 tablet (600 mg total) by mouth 2 (two) times daily. 09/24/20   Regalado, Belkys A, MD  hydrALAZINE (APRESOLINE) 25 MG tablet Take 1 tablet (25 mg total) by mouth 3 (three) times daily. 11/23/20 05/22/21  [name], MD  insulin glargine (LANTUS SOLOSTAR) 100 UNIT/ML Solostar Pen Inject 55 Units into the skin at bedtime. If fasting blood sugars remain >150 for 1 week, increase insulin dose by 5 units. Maximum of 80 units. 12/19/20   [name], MD  Insulin Pen Needle (PEN NEEDLES) 31G X 6 MM MISC 1 application by Does not apply route at bedtime. 11/03/20   Nche, Charlene Brooke, NP  ipratropium-albuterol (DUONEB) 0.5-2.5 (3) MG/3ML SOLN Take 3 mLs by nebulization every 6 (six) hours as needed. Patient taking differently: Take 3 mLs by nebulization every 6 (six) hours as needed (For shortness of breath). 09/24/20   Regalado, Belkys A, MD  isosorbide mononitrate (IMDUR) 30 MG 24 hr tablet Take 30 mg by mouth daily. 07/04/20   [provider]  Lancets [facility] ULTRASOFT) lancets USE TO TEST BLOOD SUGAR 1-2 TIMES DAILY 05/22/20   [name], MD  lubiprostone [facility]) 8  MCG capsule Take 1 capsule (8 mcg total) by mouth 2 (two) times daily with a meal. 12/21/20   [name], MD  Multiple Vitamin (MULTIVITAMIN WITH MINERALS) TABS tablet Take 1 tablet by mouth daily.    [provider]  nitroGLYCERIN (NITROSTAT) 0.4 MG SL tablet DISSOLVE 1 TABLET UNDER THE TONGUE EVERY 5 MINUTES AS&nbsp;&nbsp;NEEDED FOR CHEST PAIN. MAX&nbsp;&nbsp;OF 3 TABLETS IN 15 MINUTES. CALL 911 IF PAIN PERSISTS. Patient taking differently: Place 0.4 mg under the tongue every 5 (five) minutes as needed for chest pain. 07/25/20   [name], MD  nystatin (MYCOSTATIN) 100000 UNIT/ML suspension Take 5 mLs (500,000 Units total) by mouth 4 (four) times daily. 12/07/20   Parrett, Fonnie Mu, NP  pantoprazole (PROTONIX) 40 MG tablet TAKE ONE TABLET BY MOUTH EVERY MORNING 10/29/20   Esterwood, Amy S, PA-C  potassium chloride SA (KLOR-CON) 20 MEQ tablet Take 2 tablets (40 mEq total) by mouth daily. 11/23/20 05/22/21  [name], MD  predniSONE (DELTASONE) 10 MG tablet Take 1 tablet (10 mg total) by mouth daily with breakfast. 10/22/20   Parrett, Fonnie Mu, NP  Roflumilast (DALIRESP) 250 MCG TABS Take 250 mcg by mouth daily. 11/06/20   Parrett, Fonnie Mu, NP  rosuvastatin (CRESTOR) 20 MG tablet TAKE ONE TABLET BY MOUTH ONCE DAILY 10/29/20   Dutch Quint B, FNP  spironolactone (ALDACTONE) 25 MG tablet Take 1 tablet (25 mg total) by mouth daily. 10/24/20 11/23/20  Barb Merino, MD    Allergies    Lisinopril, Other, and Tomato  Review of Systems   Review of Systems  Respiratory: Positive for shortness of breath.   All other systems reviewed and are negative.   Physical Exam Updated Vital Signs BP (!) 150/84 (BP Location: Left Arm)   Pulse 87   Temp (!) 96.9 F (36.1 C) (Axillary)   Resp 20   SpO2 100%   Physical Exam Vitals and nursing note reviewed.  Constitutional:      General: He is not in acute distress.    Appearance: He is well-developed.  HENT:     Head: Normocephalic and  atraumatic.  Eyes:     Conjunctiva/sclera: Conjunctivae normal.     Pupils: Pupils are equal,  round, and reactive to light.  Cardiovascular:     Rate and Rhythm: Normal rate and regular rhythm.     Heart sounds: No murmur heard.   Pulmonary:     Effort: Pulmonary effort is normal. Tachypnea present. No respiratory distress.     Breath sounds: Wheezing and rales present.  Abdominal:     General: There is no distension.     Palpations: Abdomen is soft.     Tenderness: There is no abdominal tenderness. There is no guarding or rebound.  Musculoskeletal:        General: No tenderness. Normal range of motion.     Cervical back: Normal range of motion and neck supple.     Right lower leg: No edema.     Left lower leg: No edema.  Skin:    General: Skin is warm and dry.     Findings: No erythema or rash.  Neurological:     Mental Status: He is alert and oriented to person, place, and time. Mental status is at baseline.  Psychiatric:        Mood and Affect: Mood normal.        Behavior: Behavior normal.        Thought Content: Thought content normal.     ED Results / Procedures / Treatments   Labs (all labs ordered are listed, but only abnormal results are displayed) Labs Reviewed  CBC WITH DIFFERENTIAL/PLATELET - Abnormal; Notable for the following components:      Result Value   RBC 3.80 (*)    Hemoglobin 10.6 (*)    HCT 35.8 (*)    MCHC 29.6 (*)    All other components within normal limits  COMPREHENSIVE METABOLIC PANEL - Abnormal; Notable for the following components:   Sodium 134 (*)    Chloride 90 (*)    CO2 38 (*)    Glucose, Bld 488 (*)    Creatinine, Ser 1.67 (*)    GFR, Estimated 47 (*)    All other components within normal limits  BRAIN NATRIURETIC PEPTIDE - Abnormal; Notable for the following components:   B Natriuretic Peptide 393.8 (*)    All other components within normal limits  SARS CORONAVIRUS 2 (TAT 6-24 HRS)  TROPONIN I (HIGH SENSITIVITY)  TROPONIN I  (HIGH SENSITIVITY)    EKG EKG Interpretation  Date/Time:  Sunday December 23 2020 12:12:49 EDT Ventricular Rate:  88 PR Interval:  192 QRS Duration: 76 QT Interval:  372 QTC Calculation: 451 R Axis:   61 Text Interpretation: Sinus rhythm Probable LVH with secondary repol abnrm No significant change since last tracing Confirmed by Blanchie Dessert ([id number]) on 12/23/2020 1:04:07 PM   Radiology DG Chest Port 1 View  Result Date: 12/23/2020 CLINICAL DATA:  Shortness of breath. EXAM: PORTABLE CHEST 1 VIEW COMPARISON:  Radiographs 10/22/2020.  CT 10/03/2020. FINDINGS: 1221 hours. Two views obtained. The heart size and mediastinal contours are stable with aortic atherosclerosis. The lungs are hyperinflated but clear. There is no pleural effusion or pneumothorax. Evidence of healing anterior rib fractures bilaterally. No acute osseous findings. Telemetry leads overlie the chest. IMPRESSION: No evidence of acute cardiopulmonary process. Healing rib fractures bilaterally. Emphysema. Electronically Signed   By: Richardean Sale M.D.   On: 12/23/2020 12:44    Procedures Procedures   Medications Ordered in ED Medications - No data to display  ED Course  I have reviewed the triage vital signs and the nursing notes.  Pertinent labs & imaging results that were  available during my care of the patient were reviewed by me and considered in my medical decision making (see chart for details).    MDM Rules/Calculators/A&P                          Patient presenting today with worsening shortness of breath.  Seems his symptoms started last night but became abruptly worse this morning.  Patient does have multiple medical problems and is a chronic oxygen user at home.  He does not have evidence of excessive fluid overload at this time however he does have some rhonchi/rales including wheezing.  Patient currently is not hypertensive and does not have excessive signs of fluid overload so lower suspicion for flash  pulmonary edema and suspect this is a COPD exacerbation.  He has already had magnesium, Solu-Medrol and albuterol/Atrovent.  He is currently on BiPAP and appears to be tolerating this well.  Oxygen saturation of 100%.  He denies any recent infectious symptoms.  Blood work, imaging and EKG are pending.  2:02 PM Patiently currently on an hour-long continuous albuterol neb and remains on BiPAP and appears to be breathing much more comfortably but still has some tightness and wheezing.  CBC is within normal limits today with a stable hemoglobin and normal white count, CMP with creatinine 1.67 which appears to be baseline and hyperglycemia of 488.  Potassium within normal limits and no anion gap.  Audie Box is at 83 which is improved from prior and BNP today is 393 which is significantly improved from prior switch appear to be in the thousands.  Feel that this is most likely a COPD exacerbation.  Chest x-ray shows signs of COPD but no evidence of fluid overload or pneumonia.  Will admit patient for COPD exacerbation.  Will attempt to wean BiPAP after continuous neb is completed.  MDM Number of Diagnoses or Management Options   Amount and/or Complexity of Data Reviewed Clinical lab tests: ordered and reviewed Tests in the radiology section of CPT: ordered and reviewed Tests in the medicine section of CPT: ordered and reviewed Decide to obtain previous medical records or to obtain history from someone other than the patient: yes Obtain history from someone other than the patient: yes Review and summarize past medical records: yes Independent visualization of images, tracings, or specimens: yes  Risk of Complications, Morbidity, and/or Mortality Presenting problems: high Diagnostic procedures: high Management options: high  Patient Progress Patient progress: improved  CRITICAL CARE Performed by: Janalee Grobe Total critical care time: 30 minutes Critical care time was exclusive of separately  billable procedures and treating other patients. Critical care was necessary to treat or prevent imminent or life-threatening deterioration. Critical care was time spent personally by me on the following activities: development of treatment plan with patient and/or surrogate as well as nursing, discussions with consultants, evaluation of patient's response to treatment, examination of patient, obtaining history from patient or surrogate, ordering and performing treatments and interventions, ordering and review of laboratory studies, ordering and review of radiographic studies, pulse oximetry and re-evaluation of patient's condition.   Final Clinical Impression(s) / ED Diagnoses Final diagnoses:  COPD exacerbation [facility])    Rx / DC Orders ED Discharge Orders    None       Blanchie Dessert, MD 12/23/20 1404    Blanchie Dessert, MD 12/23/20 1405

## 2020-12-24 ENCOUNTER — Ambulatory Visit: Payer: Medicare Other | Admitting: Family Medicine

## 2020-12-24 ENCOUNTER — Other Ambulatory Visit: Payer: Self-pay

## 2020-12-24 ENCOUNTER — Encounter (HOSPITAL_COMMUNITY): Payer: Self-pay | Admitting: Internal Medicine

## 2020-12-24 ENCOUNTER — Ambulatory Visit: Payer: Medicare Other | Admitting: Cardiology

## 2020-12-24 LAB — GLUCOSE, CAPILLARY
Glucose-Capillary: 197 mg/dL — ABNORMAL HIGH (ref 70–99)
Glucose-Capillary: 218 mg/dL — ABNORMAL HIGH (ref 70–99)
Glucose-Capillary: 219 mg/dL — ABNORMAL HIGH (ref 70–99)
Glucose-Capillary: 221 mg/dL — ABNORMAL HIGH (ref 70–99)
Glucose-Capillary: 288 mg/dL — ABNORMAL HIGH (ref 70–99)

## 2020-12-24 MED ORDER — INSULIN ASPART 100 UNIT/ML ~~LOC~~ SOLN
8.0000 [IU] | Freq: Three times a day (TID) | SUBCUTANEOUS | Status: DC
  Administered 2020-12-24 – 2020-12-25 (×3): 8 [IU] via SUBCUTANEOUS

## 2020-12-24 MED ORDER — ALBUTEROL SULFATE (2.5 MG/3ML) 0.083% IN NEBU
2.5000 mg | INHALATION_SOLUTION | Freq: Two times a day (BID) | RESPIRATORY_TRACT | Status: DC
  Administered 2020-12-25: 2.5 mg via RESPIRATORY_TRACT
  Filled 2020-12-24 (×2): qty 3

## 2020-12-24 NOTE — Progress Notes (Signed)
   Pt is active with [facility].  He receives nurse and social work support in the home.  Please contact [facility] if we can be of assistance with d/c planning.  Thank you Julius Bowels, RN Office [phone] Mobile [phone]

## 2020-12-24 NOTE — Progress Notes (Signed)
PROGRESS NOTE  Kenneth [name] UXN:[id number] DOB: [birth year] DOA: 12/23/2020 PCP: [name], MD  HPI/Recap of past 24 hours: Kenneth [name] is a 60 y.o. male with medical history significant of HTN, HLD, PAF on Eliquis, CHF last EF 55-60%, DM type II, COPD, chronic respiratory failure on 2-2.5 L oxygen, OSA, and depression who presented with complaints of shortness of breath of 1 day duration.  Reports having associated symptoms of a nonproductive cough and wheezing.  He had quit smoking altogether approximately 4 months ago and reports that he also has not drink any alcohol in a couple months as well.  Patient has been compliant with all of his medications at home including anticoagulation.  In route with EMS patient was placed on CPAP due to work of breathing and given 125 mg of Solu-Medrol, 2 g of magnesium sulfate, and 10 mg of albuterol prior to arrival.  Of note patient had been admitted into the hospital 1/2-1/3 for COPD exacerbation, 1/12-1/22 after PEA arrest requiring intubation with COPD and congestive heart failure exacerbation, and lastly from 1/31-2/2 with a COPD exacerbation  Chest x-ray showed no acute abnormality and emphysematous changes.  12/24/20: Patient was seen and examined at his bedside.  Reports he feels better however still has dyspnea with minimal exertion.  Denies having any chest pain.  Assessment/Plan: Principal Problem:   Chronic obstructive pulmonary disease with (acute) exacerbation (HCC) Active Problems:   Essential hypertension   Mixed anxiety and depressive disorder   Paroxysmal atrial fibrillation (HCC)   Hyperlipidemia   Type 2 diabetes mellitus with diabetic neuropathy, with long-term current use of insulin (HCC)   Acute on chronic respiratory failure with hypoxia (HCC)  Acute on chronic hypoxic respiratory failure secondary to acute COPD exacerbation. At baseline patient is on 2-2.5 L of oxygen by nasal cannula He is currently on 3 L Will wean  off oxygen supplementation as tolerated He is currently on Rocephin for COPD exacerbation, started in ED. Continue bronchodilators and steroids Maintain O2 saturation greater than 90%  Acute COPD exacerbation States there were fumes from cooking at onset of his symptoms may have caused this exacerbation. He quit tobacco use approximately 4 months ago Management of cerebral.  AKI Baseline creatinine is 1.2 with GFR greater than 60 Presented with creatinine of 1.6 with GFR 47 Avoid nephrotoxic agents Monitor urine output Repeat renal panel in the morning  Type 2 diabetes with hyperglycemia Hemoglobin A1c 14.4 on 12/23/2020 Continue Lantus 55 unit nightly, NovoLog 8 units 3 times daily, insulin sliding scale  Elevated troponin likely demand ischemia in the setting of acute on chronic hypoxia Troponin peaked at 18 Denies any anginal symptoms. EKG sinus rhythm rate of 88 nonspecific ST-T changes. Continue to monitor  Chronic diastolic CHF 2D echo done on 10/21/2020 showed LVEF 55 to 60%, a small pericardial effusion was present. He is euvolemic on exam BNP mildly elevated 393 on 12/23/2020 from greater than 1700 on 10/23/2020. Strict I's and O's and daily weight  Essential hypertension Resume home regimen  GERD Resume home regimen  Chronic anxiety/depression Resume home regimen  Hyperlipidemia Resume home regimen  Diabetic polyneuropathy Resume home regimen.  Code Status: Full code.  Family Communication: None at bedside.  Disposition Plan: Likely will discharge to home when dyspnea has improved.   Consultants:  None.  Procedures:  None.  Antimicrobials:  Rocephin.  DVT prophylaxis: Eliquis.  Status is: Inpatient    Dispo:  Patient From: Home  Planned Disposition: Home  Medically stable  for discharge: No          Objective: Vitals:   12/24/20 0300 12/24/20 0841 12/24/20 0848 12/24/20 1222  BP: (!) 174/81  (!) 168/85 (!) 149/81  Pulse:   78  77  Resp: 18  15 16   Temp: 97.9 F (36.6 C)  98 F (36.7 C) 98.1 F (36.7 C)  TempSrc: Oral  Oral Oral  SpO2: 100% 100% 99% 100%  Weight:      Height:        Intake/Output Summary (Last 24 hours) at 12/24/2020 1441 Last data filed at 12/24/2020 0600 Gross per 24 hour  Intake 960 ml  Output --  Net 960 ml   Filed Weights   12/23/20 2038 12/24/20 0258  Weight: 87.7 kg 87.6 kg    Exam:  . General: 60 y.o. year-old male well developed well nourished in no acute distress.  Alert and oriented x3. . Cardiovascular: Regular rate and rhythm with no rubs or gallops.  No thyromegaly or JVD noted.   46 Respiratory: Clear to auscultation with no wheezes or rales. Good inspiratory effort. . Abdomen: Soft nontender nondistended with normal bowel sounds x4 quadrants. . Musculoskeletal: No lower extremity edema. 2/4 pulses in all 4 extremities. . Skin: No ulcerative lesions noted or rashes, . Psychiatry: Mood is appropriate for condition and setting   Data Reviewed: CBC: Recent Labs  Lab 12/23/20 1203 12/23/20 1504 12/23/20 2322  WBC 8.2  --  11.6*  NEUTROABS 6.7  --   --   HGB 10.6* 9.2* 10.3*  HCT 35.8* 27.0* 34.4*  MCV 94.2  --  93.7  PLT 259  --  245   Basic Metabolic Panel: Recent Labs  Lab 12/23/20 1203 12/23/20 1504 12/23/20 2322  NA 134* 132* 136  K 5.0 4.8 4.5  CL 90*  --  94*  CO2 38*  --  33*  GLUCOSE 488*  --  234*  BUN 18  --  20  CREATININE 1.67*  --  1.51*  CALCIUM 8.9  --  9.3   GFR: Estimated Creatinine Clearance: 57.8 mL/min (A) (by C-G formula based on SCr of 1.51 mg/dL (H)). Liver Function Tests: Recent Labs  Lab 12/23/20 1203  AST 26  ALT 39  ALKPHOS 91  BILITOT 0.4  PROT 6.7  ALBUMIN 3.7   No results for input(s): LIPASE, AMYLASE in the last 168 hours. No results for input(s): AMMONIA in the last 168 hours. Coagulation Profile: No results for input(s): INR, PROTIME in the last 168 hours. Cardiac Enzymes: No results for input(s):  CKTOTAL, CKMB, CKMBINDEX, TROPONINI in the last 168 hours. BNP (last 3 results) No results for input(s): PROBNP in the last 8760 hours. HbA1C: Recent Labs    12/23/20 2322  HGBA1C 14.4*   CBG: Recent Labs  Lab 12/23/20 2034 12/23/20 2307 12/24/20 0027 12/24/20 0800 12/24/20 1119  GLUCAP 437* 227* 197* 218* 288*   Lipid Profile: No results for input(s): CHOL, HDL, LDLCALC, TRIG, CHOLHDL, LDLDIRECT in the last 72 hours. Thyroid Function Tests: No results for input(s): TSH, T4TOTAL, FREET4, T3FREE, THYROIDAB in the last 72 hours. Anemia Panel: No results for input(s): VITAMINB12, FOLATE, FERRITIN, TIBC, IRON, RETICCTPCT in the last 72 hours. Urine analysis:    Component Value Date/Time   COLORURINE AMBER (A) 10/03/2020 2036   APPEARANCEUR CLOUDY (A) 10/03/2020 2036   LABSPEC 1.016 10/03/2020 2036   PHURINE 6.0 10/03/2020 2036   GLUCOSEU >=500 (A) 10/03/2020 2036   GLUCOSEU NEGATIVE 05/20/2019 0825  HGBUR NEGATIVE 10/03/2020 2036   BILIRUBINUR NEGATIVE 10/03/2020 2036   KETONESUR NEGATIVE 10/03/2020 2036   PROTEINUR >=300 (A) 10/03/2020 2036   UROBILINOGEN 1.0 05/20/2019 0825   NITRITE NEGATIVE 10/03/2020 2036   LEUKOCYTESUR NEGATIVE 10/03/2020 2036   Sepsis Labs: @LABRCNTIP (procalcitonin:4,lacticidven:4)  ) Recent Results (from the past 240 hour(s))  SARS CORONAVIRUS 2 (TAT 6-24 HRS) Nasopharyngeal Nasopharyngeal Swab     Status: None   Collection Time: 12/23/20  2:24 PM   Specimen: Nasopharyngeal Swab  Result Value Ref Range Status   SARS Coronavirus 2 NEGATIVE NEGATIVE Final    Comment: (NOTE) SARS-CoV-2 target nucleic acids are NOT DETECTED.  The SARS-CoV-2 RNA is generally detectable in upper and lower respiratory specimens during the acute phase of infection. Negative results do not preclude SARS-CoV-2 infection, do not rule out co-infections with other pathogens, and should not be used as the sole basis for treatment or other patient management  decisions. Negative results must be combined with clinical observations, patient history, and epidemiological information. The expected result is Negative.  Fact Sheet for Patients: 02/22/21  Fact Sheet for Healthcare Providers: HairSlick.no  This test is not yet approved or cleared by the [employer]ier[employer]ir[employer] FDA and  has been authorized for detection and/or diagnosis of SARS-CoV-2 by FDA under an Emergency Use Authorization (EUA). This EUA will remain  in effect (meaning this test can be used) for the duration of the COVID-19 declaration under Se ction 564(b)(1) of the Act, 21 U.S.C. section 360bbb-3(b)(1), unless the authorization is terminated or revoked sooner.  Performed at [facility], [address] [place], [place]       Studies: No results found.  Scheduled Meds: . albuterol  2.5 mg Nebulization BID  . amLODipine  5 mg Oral q morning  . apixaban  5 mg Oral BID  . ARIPiprazole  5 mg Oral Daily  . busPIRone  15 mg Oral BID  . carvedilol  12.5 mg Oral BID WC  . clotrimazole  10 mg Oral 5 X Daily  . famotidine  20 mg Oral QHS  . FLUoxetine  60 mg Oral Daily  . umeclidinium bromide  1 puff Inhalation Daily   And  . fluticasone furoate-vilanterol  1 puff Inhalation Daily  . furosemide  40 mg Oral BID  . gabapentin  200 mg Oral TID  . guaiFENesin  600 mg Oral BID  . hydrALAZINE  25 mg Oral TID  . insulin aspart  0-15 Units Subcutaneous TID WC  . insulin aspart  8 Units Subcutaneous TID WC  . insulin glargine  55 Units Subcutaneous QHS  . isosorbide mononitrate  30 mg Oral Daily  . lubiprostone  8 mcg Oral BID WC  . pantoprazole  40 mg Oral q morning  . [START ON 12/25/2020] predniSONE  40 mg Oral Q breakfast  . roflumilast  250 mcg Oral Daily  . rosuvastatin  20 mg Oral Daily  . spironolactone  25 mg Oral Daily    Continuous Infusions: . cefTRIAXone (ROCEPHIN)  IV 1 g (12/24/20 1432)      LOS: 1 day     02/23/21, MD [facility] Pager [phone]  If 7PM-7AM, please contact night-coverage [email or web address] Password [facility] 12/24/2020, 2:41 PM

## 2020-12-24 NOTE — Progress Notes (Signed)
Inpatient Diabetes Program Recommendations  AACE/ADA: New Consensus Statement on Inpatient Glycemic Control (2015)  Target Ranges:  Prepandial:   less than 140 mg/dL      Peak postprandial:   less than 180 mg/dL (1-2 hours)      Critically ill patients:  140 - 180 mg/dL   Lab Results  Component Value Date   GLUCAP 218 (H) 12/24/2020   HGBA1C 14.4 (H) 12/23/2020  Results for Kenneth [name], Kenneth [name] (MRN [id number]) as of 12/24/2020 10:36  Ref. Range 09/13/2020 04:08 12/23/2020 23:22  Hemoglobin A1C Latest Ref Range: 4.8 - 5.6 % 6.3 (H) 14.4 (H)    Review of Glycemic Control Results for DILON, LANK (MRN [id number]) as of 12/24/2020 10:36  Ref. Range 12/23/2020 20:34 12/23/2020 23:07 12/24/2020 00:27 12/24/2020 08:00  Glucose-Capillary Latest Ref Range: 70 - 99 mg/dL 315 (H) 400 (H) 867 (H) 218 (H)   Diabetes history: DM2 Outpatient Diabetes medications:  Lantus 55 units q HS, Prednisone 10 mg daily Current orders for Inpatient glycemic control:  Solumedrol 60 mg IV q 8 hours Novolog moderate tid with meals  Lantus 55 units q HS  Inpatient Diabetes Program Recommendations:   Consider adding Novolog 8 units tid with meals (hold if patient eats less than 50% or NPO).   Called and spoke with patient regarding DM and A1C of 14.4%.  He states that he was recently started on insulin after being hospitalized.  A1C was in the 6% range in December of 2021 and now>14%.  He states that he is taking insulin as ordered.  We discussed insulin administration and the need to rotate sites. I think he likely needs meal coverage at home as well.  It appears he is being followed closely by PCP.  Will follow.   Thanks Beryl Meager, RN, BC-ADM Inpatient Diabetes Coordinator Pager [phone] (8a-5p)

## 2020-12-24 NOTE — Plan of Care (Signed)
  Problem: Education: Goal: Knowledge of General Education information will improve Description Including pain rating scale, medication(s)/side effects and non-pharmacologic comfort measures Outcome: Progressing   

## 2020-12-24 NOTE — Progress Notes (Signed)
Date and time results received: 4/3 @ 22:00  Critical Value: Blood gas, venous:  PCO2  73.8  Name of Provider Notified: Sanda Klein, MD  Orders Received Or Actions Taken: Pt already has active order for routine HS Bipap; pt asymptomatic & A&Ox4; Dr. Robb Matar, MD returned page to discuss pt's result & condition. Per MD continue with above order. Will continue to monitor.   Update.Marland KitchenMarland KitchenPt wore Bipap from ~ 2330 to 0300. Pt requested to remove Bipap for PO intake; educated on importance of wearing Bipap; stated he will call when he goes back to sleep

## 2020-12-25 ENCOUNTER — Telehealth: Payer: Medicare Other

## 2020-12-25 ENCOUNTER — Telehealth: Payer: Self-pay | Admitting: *Deleted

## 2020-12-25 ENCOUNTER — Telehealth: Payer: Self-pay

## 2020-12-25 DIAGNOSIS — I10 Essential (primary) hypertension: Secondary | ICD-10-CM

## 2020-12-25 DIAGNOSIS — Z794 Long term (current) use of insulin: Secondary | ICD-10-CM

## 2020-12-25 DIAGNOSIS — E1140 Type 2 diabetes mellitus with diabetic neuropathy, unspecified: Secondary | ICD-10-CM

## 2020-12-25 DIAGNOSIS — J9621 Acute and chronic respiratory failure with hypoxia: Secondary | ICD-10-CM

## 2020-12-25 LAB — BASIC METABOLIC PANEL
Anion gap: 6 (ref 5–15)
BUN: 28 mg/dL — ABNORMAL HIGH (ref 6–20)
CO2: 35 mmol/L — ABNORMAL HIGH (ref 22–32)
Calcium: 8.8 mg/dL — ABNORMAL LOW (ref 8.9–10.3)
Chloride: 92 mmol/L — ABNORMAL LOW (ref 98–111)
Creatinine, Ser: 1.4 mg/dL — ABNORMAL HIGH (ref 0.61–1.24)
GFR, Estimated: 58 mL/min — ABNORMAL LOW (ref >60)
Glucose, Bld: 395 mg/dL — ABNORMAL HIGH (ref 70–99)
Potassium: 5 mmol/L (ref 3.5–5.1)
Sodium: 133 mmol/L — ABNORMAL LOW (ref 135–145)

## 2020-12-25 LAB — CBC
HCT: 30.7 % — ABNORMAL LOW (ref 39.0–52.0)
Hemoglobin: 9.4 g/dL — ABNORMAL LOW (ref 13.0–17.0)
MCH: 28.8 pg (ref 26.0–34.0)
MCHC: 30.6 g/dL (ref 30.0–36.0)
MCV: 94.2 fL (ref 80.0–100.0)
Platelets: 235 10*3/uL (ref 150–400)
RBC: 3.26 MIL/uL — ABNORMAL LOW (ref 4.22–5.81)
RDW: 13.4 % (ref 11.5–15.5)
WBC: 11.5 10*3/uL — ABNORMAL HIGH (ref 4.0–10.5)
nRBC: 0 % (ref 0.0–0.2)

## 2020-12-25 LAB — GLUCOSE, CAPILLARY
Glucose-Capillary: 378 mg/dL — ABNORMAL HIGH (ref 70–99)
Glucose-Capillary: 397 mg/dL — ABNORMAL HIGH (ref 70–99)
Glucose-Capillary: 232 mg/dL — ABNORMAL HIGH (ref 70–99)

## 2020-12-25 LAB — MAGNESIUM: Magnesium: 2.2 mg/dL (ref 1.7–2.4)

## 2020-12-25 LAB — PHOSPHORUS: Phosphorus: 2.8 mg/dL (ref 2.5–4.6)

## 2020-12-25 MED ORDER — PREDNISONE 20 MG PO TABS: 40.0000 mg | ORAL_TABLET | Freq: Every day | ORAL | 0 refills | Status: AC

## 2020-12-25 MED ORDER — METHYLPREDNISOLONE SODIUM SUCC 40 MG IJ SOLR
40.0000 mg | Freq: Three times a day (TID) | INTRAMUSCULAR | Status: DC
  Administered 2020-12-25: 40 mg via INTRAVENOUS
  Filled 2020-12-25: qty 1

## 2020-12-25 MED ORDER — LEVALBUTEROL HCL 0.63 MG/3ML IN NEBU: 0.6300 mg | INHALATION_SOLUTION | RESPIRATORY_TRACT | Status: DC | PRN

## 2020-12-25 MED ORDER — INSULIN ASPART 100 UNIT/ML ~~LOC~~ SOLN
6.0000 [IU] | Freq: Once | SUBCUTANEOUS | Status: AC
  Administered 2020-12-25: 6 [IU] via SUBCUTANEOUS

## 2020-12-25 MED ORDER — ALBUTEROL SULFATE (2.5 MG/3ML) 0.083% IN NEBU: 2.5000 mg | INHALATION_SOLUTION | Freq: Four times a day (QID) | RESPIRATORY_TRACT | Status: AC | PRN

## 2020-12-25 NOTE — Discharge Summary (Signed)
Physician Discharge Summary  Less Woolsey GYB:[id number] DOB: 03/29/1961 DOA: 12/23/2020  PCP: [name], MD  Admit date: 12/23/2020 Discharge date: 12/25/2020  Admitted From: Home Disposition: Home  Recommendations for Outpatient Follow-up:  1. Follow up with PCP in 1 week with repeat CBC/BMP 2. Outpatient follow-up with pulmonary 3. Follow up in ED if symptoms worsen or new appear   Home Health: No Equipment/Devices: Continue oxygen supplementation via nasal cannula.  Uses 2 to 2.5 L normally at home.  Discharge Condition: Stable CODE STATUS: Full Diet recommendation: Heart healthy/carb modified  Brief/Interim Summary: 60 y.o.malewith medical history significant ofHTN, HLD, PAF on Eliquis, CHF last EF 55-60%, DM type II, COPD with recent hospitalizations for COPD exacerbation, chronic respiratory failure on 2-2.5 L oxygen,OSA, and depression presented with worsening shortness of breath.  He was admitted for COPD exacerbation and treated with intravenous steroids.  Chest x-ray on presentation showed no acute abnormality and showed emphysematous changes.  During the hospitalization, his symptoms have improved.  He feels better and wants to go home today.  He will be discharged home today on oral prednisone.  Discharge Diagnoses:   Acute on chronic hypoxic respiratory failure COPD exacerbation -Normally wears 2 to 2.5 L oxygen at home via nasal cannula.  Currently on 2 L oxygen -Treated with IV Solu-Medrol and nebs.  Symptoms have improved.  He feels better and wants to go home today. -Discharge home on prednisone 40 mg daily for 7 days.  Continue other home regimen.  Outpatient follow-up with pulmonary  Acute kidney injury -Baseline creatinine is 1.2.  Presented with creatinine of 1.6.  Improving to 1.4 today.  Outpatient follow-up  Diabetes mellitus type 2 with hyperglycemia Diabetic neuropathy -A1c 14.4.  Carb modified diet.  Continue home regimen.  Outpatient  follow-up -Continue gabapentin  Elevated troponin: Most likely from demand ischemia  -Troponins did not trend upwards.  No chest pain.  Outpatient follow-up  Chronic diastolic CHF -Currently compensated.  Continue Coreg, Lasix, hydralazine and isosorbide mononitrate.  Hold spironolactone for now because of potassium levels trending upwards.  Outpatient follow-up with cardiology  Essential hypertension -Antihypertensive plan as above  Chronic anxiety/depression -Continue home regimen  Hyperlipidemia -Continue home regimen    Discharge Instructions  Discharge Instructions    Ambulatory referral to Pulmonology   Complete by: As directed    Hospital followup   Reason for referral: Asthma/COPD   Diet - low sodium heart healthy   Complete by: As directed    Diet Carb Modified   Complete by: As directed    Increase activity slowly   Complete by: As directed      Allergies as of 12/25/2020      Reactions   Lisinopril Swelling   Other Other (See Comments)   Lettuce : rash   Tomato Rash      Medication List    STOP taking these medications   potassium chloride SA 20 MEQ tablet Commonly known as: KLOR-CON   spironolactone 25 MG tablet Commonly known as: ALDACTONE     TAKE these medications   acetaminophen 325 MG tablet Commonly known as: TYLENOL Take 650 mg by mouth every 6 (six) hours as needed for mild pain or headache.   albuterol (2.5 MG/3ML) 0.083% nebulizer solution Commonly known as: PROVENTIL Take 3 mLs (2.5 mg total) by nebulization every 6 (six) hours as needed for shortness of breath.   amLODipine 5 MG tablet Commonly known as: NORVASC Take 5 mg by mouth every morning.  ARIPiprazole 5 MG tablet Commonly known as: ABILIFY Take 5 mg by mouth daily.   busPIRone 15 MG tablet Commonly known as: BUSPAR Take 1 tablet (15 mg total) by mouth 2 (two) times daily.   carvedilol 12.5 MG tablet Commonly known as: COREG Take 1 tablet (12.5 mg total) by  mouth 2 (two) times daily with a meal.   clotrimazole 10 MG troche Commonly known as: MYCELEX Take 1 tablet (10 mg total) by mouth 5 (five) times daily.   Coricidin HBP Day/Night Cold 10-20 &15-200-2 MG Misc Generic drug: DM-GG & DM-APAP-CPM Take 1 tablet by mouth every 12 (twelve) hours as needed (cold symptoms).   Daliresp 250 MCG Tabs Generic drug: Roflumilast Take 250 mcg by mouth daily.   Eliquis 5 MG Tabs tablet Generic drug: apixaban TAKE ONE TABLET BY MOUTH EVERY MORNING and TAKE ONE TABLET BY MOUTH EVERY EVENING What changed: See the new instructions.   famotidine 20 MG tablet Commonly known as: PEPCID Take 20 mg by mouth daily.   FLUoxetine 20 MG capsule Commonly known as: PROZAC TAKE 3 CAPSULES BY MOUTH  DAILY   furosemide 40 MG tablet Commonly known as: LASIX Take 1 tablet (40 mg total) by mouth 2 (two) times daily.   gabapentin 100 MG capsule Commonly known as: NEURONTIN Take 2 capsules (200 mg total) by mouth 3 (three) times daily.   guaiFENesin 600 MG 12 hr tablet Commonly known as: MUCINEX Take 1 tablet (600 mg total) by mouth 2 (two) times daily.   hydrALAZINE 25 MG tablet Commonly known as: APRESOLINE Take 1 tablet (25 mg total) by mouth 3 (three) times daily.   ipratropium-albuterol 0.5-2.5 (3) MG/3ML Soln Commonly known as: DUONEB Take 3 mLs by nebulization every 6 (six) hours as needed. What changed: reasons to take this   isosorbide mononitrate 30 MG 24 hr tablet Commonly known as: IMDUR Take 30 mg by mouth daily.   Lantus SoloStar 100 UNIT/ML Solostar Pen Generic drug: insulin glargine Inject 55 Units into the skin at bedtime. If fasting blood sugars remain >150 for 1 week, increase insulin dose by 5 units. Maximum of 80 units.   lubiprostone 8 MCG capsule Commonly known as: [name] Take 1 capsule (8 mcg total) by mouth 2 (two) times daily with a meal.   multivitamin with minerals Tabs tablet Take 1 tablet by mouth daily.    nitroGLYCERIN 0.4 MG SL tablet Commonly known as: NITROSTAT DISSOLVE 1 TABLET UNDER THE TONGUE EVERY 5 MINUTES AS&nbsp;&nbsp;NEEDED FOR CHEST PAIN. MAX&nbsp;&nbsp;OF 3 TABLETS IN 15 MINUTES. CALL 911 IF PAIN PERSISTS. What changed:   how much to take  how to take this  when to take this  reasons to take this  additional instructions   nystatin 100000 UNIT/ML suspension Commonly known as: MYCOSTATIN Take 5 mLs (500,000 Units total) by mouth 4 (four) times daily.   ONE TOUCH ULTRA 2 w/Device Kit Use to test blood sugars 1-2 times daily.   OneTouch Ultra test strip Generic drug: glucose blood USE TO TEST BLOOD SUGAR 1-2 TIMES DAILY   onetouch ultrasoft lancets USE TO TEST BLOOD SUGAR 1-2 TIMES DAILY   pantoprazole 40 MG tablet Commonly known as: PROTONIX TAKE ONE TABLET BY MOUTH EVERY MORNING   Pen Needles 31G X 6 MM Misc 1 application by Does not apply route at bedtime.   predniSONE 20 MG tablet Commonly known as: DELTASONE Take 2 tablets (40 mg total) by mouth daily with breakfast for 7 days. What changed:   medication  strength  how much to take   rosuvastatin 20 MG tablet Commonly known as: CRESTOR TAKE ONE TABLET BY MOUTH ONCE DAILY   Trelegy Ellipta 100-62.5-25 MCG/INH Aepb Generic drug: Fluticasone-Umeclidin-Vilant Inhale 1 puff into the lungs daily.       Follow-up Information    [name], MD. Schedule an appointment as soon as possible for a visit in 1 week(s).   Specialty: Family Medicine Why: with repeat cbc/bmp Contact information: [place] [address] [phone]        Buford Dresser, MD .   Specialty: Cardiology Contact information: [address] [place] [address] [phone]              Allergies  Allergen Reactions  . Lisinopril Swelling  . Other Other (See Comments)    Lettuce : rash  . Tomato Rash    Consultations:  None   Procedures/Studies: DG  Chest Port 1 View  Result Date: 12/23/2020 CLINICAL DATA:  Shortness of breath. EXAM: PORTABLE CHEST 1 VIEW COMPARISON:  Radiographs 10/22/2020.  CT 10/03/2020. FINDINGS: 1221 hours. Two views obtained. The heart size and mediastinal contours are stable with aortic atherosclerosis. The lungs are hyperinflated but clear. There is no pleural effusion or pneumothorax. Evidence of healing anterior rib fractures bilaterally. No acute osseous findings. Telemetry leads overlie the chest. IMPRESSION: No evidence of acute cardiopulmonary process. Healing rib fractures bilaterally. Emphysema. Electronically Signed   By: Richardean Sale M.D.   On: 12/23/2020 12:44       Subjective: Patient seen and examined at bedside.  Denies any fever, vomiting, worsening shortness of breath.  Feels better and was vomiting.  Discharge Exam: Vitals:   12/25/20 0754 12/25/20 0910  BP:  (!) 176/93  Pulse:  79  Resp:  18  Temp:  97.8 F (36.6 C)  SpO2: 100% 90%    General: Pt is alert, awake, not in acute distress.  Looks chronically ill.  Currently on 2 L oxygen by nasal cannula Cardiovascular: rate controlled, S1/S2 + Respiratory: bilateral decreased breath sounds at bases with some scattered crackles Abdominal: Soft, NT, ND, bowel sounds + Extremities: Trace lower extremity edema, no cyanosis    The results of significant diagnostics from this hospitalization (including imaging, microbiology, ancillary and laboratory) are listed below for reference.     Microbiology: Recent Results (from the past 240 hour(s))  SARS CORONAVIRUS 2 (TAT 6-24 HRS) Nasopharyngeal Nasopharyngeal Swab     Status: None   Collection Time: 12/23/20  2:24 PM   Specimen: Nasopharyngeal Swab  Result Value Ref Range Status   SARS Coronavirus 2 NEGATIVE NEGATIVE Final    Comment: (NOTE) SARS-CoV-2 target nucleic acids are NOT DETECTED.  The SARS-CoV-2 RNA is generally detectable in upper and lower respiratory specimens during the  acute phase of infection. Negative results do not preclude SARS-CoV-2 infection, do not rule out co-infections with other pathogens, and should not be used as the sole basis for treatment or other patient management decisions. Negative results must be combined with clinical observations, patient history, and epidemiological information. The expected result is Negative.  Fact Sheet for Patients: SugarRoll.be  Fact Sheet for Healthcare Providers: https://www.woods-mathews.com/  This test is not yet approved or cleared by the [place] FDA and  has been authorized for detection and/or diagnosis of SARS-CoV-2 by FDA under an Emergency Use Authorization (EUA). This EUA will remain  in effect (meaning this test can be used) for the duration of the COVID-19 declaration  under Se ction 564(b)(1) of the Act, 21 U.S.C. section 360bbb-3(b)(1), unless the authorization is terminated or revoked sooner.  Performed at [facility], [place] [address], [place], [place] [address]      Labs: BNP (last 3 results) Recent Labs    10/03/20 2037 10/23/20 0417 12/23/20 1203  BNP 960.1* 1,701.2* 989.2*   Basic Metabolic Panel: Recent Labs  Lab 12/23/20 1203 12/23/20 1504 12/23/20 2322 12/25/20 0254  NA 134* 132* 136 133*  K 5.0 4.8 4.5 5.0  CL 90*  --  94* 92*  CO2 38*  --  33* 35*  GLUCOSE 488*  --  234* 395*  BUN 18  --  20 28*  CREATININE 1.67*  --  1.51* 1.40*  CALCIUM 8.9  --  9.3 8.8*  MG  --   --   --  2.2  PHOS  --   --   --  2.8   Liver Function Tests: Recent Labs  Lab 12/23/20 1203  AST 26  ALT 39  ALKPHOS 91  BILITOT 0.4  PROT 6.7  ALBUMIN 3.7   No results for input(s): LIPASE, AMYLASE in the last 168 hours. No results for input(s): AMMONIA in the last 168 hours. CBC: Recent Labs  Lab 12/23/20 1203 12/23/20 1504 12/23/20 2322 12/25/20 0254  WBC 8.2  --  11.6* 11.5*  NEUTROABS 6.7  --   --   --   HGB 10.6* 9.2*  10.3* 9.4*  HCT 35.8* 27.0* 34.4* 30.7*  MCV 94.2  --  93.7 94.2  PLT 259  --  245 235   Cardiac Enzymes: No results for input(s): CKTOTAL, CKMB, CKMBINDEX, TROPONINI in the last 168 hours. BNP: Invalid input(s): POCBNP CBG: Recent Labs  Lab 12/24/20 1626 12/24/20 2153 12/25/20 0316 12/25/20 0400 12/25/20 0723  GLUCAP 221* 219* 378* 397* 232*   D-Dimer No results for input(s): DDIMER in the last 72 hours. Hgb A1c Recent Labs    12/23/20 2322  HGBA1C 14.4*   Lipid Profile No results for input(s): CHOL, HDL, LDLCALC, TRIG, CHOLHDL, LDLDIRECT in the last 72 hours. Thyroid function studies No results for input(s): TSH, T4TOTAL, T3FREE, THYROIDAB in the last 72 hours.  Invalid input(s): FREET3 Anemia work up No results for input(s): VITAMINB12, FOLATE, FERRITIN, TIBC, IRON, RETICCTPCT in the last 72 hours. Urinalysis    Component Value Date/Time   COLORURINE AMBER (A) 10/03/2020 2036   APPEARANCEUR CLOUDY (A) 10/03/2020 2036   LABSPEC 1.016 10/03/2020 2036   PHURINE 6.0 10/03/2020 2036   GLUCOSEU >=500 (A) 10/03/2020 2036   GLUCOSEU NEGATIVE 05/20/2019 0825   HGBUR NEGATIVE 10/03/2020 2036   BILIRUBINUR NEGATIVE 10/03/2020 2036   KETONESUR NEGATIVE 10/03/2020 2036   PROTEINUR >=300 (A) 10/03/2020 2036   UROBILINOGEN 1.0 05/20/2019 0825   NITRITE NEGATIVE 10/03/2020 2036   LEUKOCYTESUR NEGATIVE 10/03/2020 2036   Sepsis Labs Invalid input(s): PROCALCITONIN,  WBC,  LACTICIDVEN Microbiology Recent Results (from the past 240 hour(s))  SARS CORONAVIRUS 2 (TAT 6-24 HRS) Nasopharyngeal Nasopharyngeal Swab     Status: None   Collection Time: 12/23/20  2:24 PM   Specimen: Nasopharyngeal Swab  Result Value Ref Range Status   SARS Coronavirus 2 NEGATIVE NEGATIVE Final    Comment: (NOTE) SARS-CoV-2 target nucleic acids are NOT DETECTED.  The SARS-CoV-2 RNA is generally detectable in upper and lower respiratory specimens during the acute phase of infection.  Negative results do not preclude SARS-CoV-2 infection, do not rule out co-infections with other pathogens, and should not  be used as the sole basis for treatment or other patient management decisions. Negative results must be combined with clinical observations, patient history, and epidemiological information. The expected result is Negative.  Fact Sheet for Patients: SugarRoll.be  Fact Sheet for Healthcare Providers: https://www.woods-mathews.com/  This test is not yet approved or cleared by the [place] FDA and  has been authorized for detection and/or diagnosis of SARS-CoV-2 by FDA under an Emergency Use Authorization (EUA). This EUA will remain  in effect (meaning this test can be used) for the duration of the COVID-19 declaration under Se ction 564(b)(1) of the Act, 21 U.S.C. section 360bbb-3(b)(1), unless the authorization is terminated or revoked sooner.  Performed at [facility], [place] [address] [place], [place] [address]      Time coordinating discharge: 35 minutes  SIGNED:   Aline August, MD  [facility] 12/25/2020, 10:26 AM

## 2020-12-25 NOTE — Progress Notes (Signed)
Left Voice message to confirm when patient would like his prednisone deliver.  Everlean Cherry Clinical Pharmacist Assistant [phone]

## 2020-12-25 NOTE — Chronic Care Management (AMB) (Signed)
  Care Management   Note  12/25/2020 Name: Quentavious Rittenhouse MRN: [id number] DOB: 1961-09-04  Benoit Meech is a 60 y.o. year old male who is a primary care patient of [name], MD and is actively engaged with the care management team. I reached out to Gwenith Daily by phone today to assist with scheduling a follow up visit with the RN Case Manager  Follow up plan: Unsuccessful telephone outreach attempt made. A HIPAA compliant phone message was left for the patient providing contact information and requesting a return call.  The care management team will reach out to the patient again over the next 7 days.  If patient returns call to provider office, please advise to call Embedded Care Management Care Guide Gwenevere Ghazi at [phone].  Gwenevere Ghazi  Care Guide, Embedded Care Coordination [facility]

## 2020-12-25 NOTE — Chronic Care Management (AMB) (Signed)
  Care Management   Note  12/25/2020 Name: Kenneth [name] MRN: [id number] DOB: 17-Mar-1961  Kenneth [name] is a 60 y.o. year old male who is a primary care patient of [name], MD and is actively engaged with the care management team. I reached out to Gwenith Daily by phone today to assist with scheduling a follow up visit with the RN Case Manager  Follow up plan: Telephone appointment with care management team member scheduled for: 01/02/2021  Gwenevere Ghazi  Care Guide, Embedded Care Coordination [facility]  Care Management

## 2020-12-25 NOTE — Progress Notes (Signed)
Pt is alert and oriented. Discharge instructions/ AVS given to pt. 

## 2020-12-25 NOTE — Discharge Instructions (Signed)
Chronic Obstructive Pulmonary Disease  Chronic obstructive pulmonary disease (COPD) is a long-term (chronic) lung problem. When you have COPD, it is hard for air to get in and out of your lungs. Usually the condition gets worse over time, and your lungs will never return to normal. There are things you can do to keep yourself as healthy as possible. What are the causes?  Smoking. This is the most common cause.  Certain genes passed from parent to child (inherited). What increases the risk?  Being exposed to secondhand smoke from cigarettes, pipes, or cigars.  Being exposed to chemicals and other irritants, such as fumes and dust in the work environment.  Having chronic lung conditions or infections. What are the signs or symptoms?  Shortness of breath, especially during physical activity.  A long-term cough with a large amount of thick mucus. Sometimes, the cough may not have any mucus (dry cough).  Wheezing.  Breathing quickly.  Skin that looks gray or blue, especially in the fingers, toes, or lips.  Feeling tired (fatigue).  Weight loss.  Chest tightness.  Having infections often.  Episodes when breathing symptoms become much worse (exacerbations). At the later stages of this disease, you may have swelling in the ankles, feet, or legs. How is this treated?  Taking medicines.  Quitting smoking, if you smoke.  Rehabilitation. This includes steps to make your body work better. It may involve a team of specialists.  Doing exercises.  Making changes to your diet.  Using oxygen.  Lung surgery.  Lung transplant.  Comfort measures (palliative care). Follow these instructions at home: Medicines  Take over-the-counter and prescription medicines only as told by your doctor.  Talk to your doctor before taking any cough or allergy medicines. You may need to avoid medicines that cause your lungs to be dry. Lifestyle  If you smoke, stop smoking. Smoking makes the  problem worse.  Do not smoke or use any products that contain nicotine or tobacco. If you need help quitting, ask your doctor.  Avoid being around things that make your breathing worse. This may include smoke, chemicals, and fumes.  Stay active, but remember to rest as well.  Learn and use tips on how to manage stress and control your breathing.  Make sure you get enough sleep. Most adults need at least 7 hours of sleep every night.  Eat healthy foods. Eat smaller meals more often. Rest before meals. Controlled breathing Learn and use tips on how to control your breathing as told by your doctor. Try:  Breathing in (inhaling) through your nose for 1 second. Then, pucker your lips and breath out (exhale) through your lips for 2 seconds.  Putting one hand on your belly (abdomen). Breathe in slowly through your nose for 1 second. Your hand on your belly should move out. Pucker your lips and breathe out slowly through your lips. Your hand on your belly should move in as you breathe out.   Controlled coughing Learn and use controlled coughing to clear mucus from your lungs. Follow these steps: 1. Lean your head a little forward. 2. Breathe in deeply. 3. Try to hold your breath for 3 seconds. 4. Keep your mouth slightly open while coughing 2 times. 5. Spit any mucus out into a tissue. 6. Rest and do the steps again 1 or 2 times as needed. General instructions  Make sure you get all the shots (vaccines) that your doctor recommends. Ask your doctor about a flu shot and a pneumonia shot.    Use oxygen therapy and pulmonary rehabilitation if told by your doctor. If you need home oxygen therapy, ask your doctor if you should buy a tool to measure your oxygen level (oximeter).  Make a COPD action plan with your doctor. This helps you to know what to do if you feel worse than usual.  Manage any other conditions you have as told by your doctor.  Avoid going outside when it is very hot, cold, or  humid.  Avoid people who have a sickness you can catch (contagious).  Keep all follow-up visits. Contact a doctor if:  You cough up more mucus than usual.  There is a change in the color or thickness of the mucus.  It is harder to breathe than usual.  Your breathing is faster than usual.  You have trouble sleeping.  You need to use your medicines more often than usual.  You have trouble doing your normal activities such as getting dressed or walking around the house. Get help right away if:  You have shortness of breath while resting.  You have shortness of breath that stops you from: ? Being able to talk. ? Doing normal activities.  Your chest hurts for longer than 5 minutes.  Your skin color is more blue than usual.  Your pulse oximeter shows that you have low oxygen for longer than 5 minutes.  You have a fever.  You feel too tired to breathe normally. These symptoms may represent a serious problem that is an emergency. Do not wait to see if the symptoms will go away. Get medical help right away. Call your local emergency services (911 in the [place] Do not drive yourself to the hospital. Summary  Chronic obstructive pulmonary disease (COPD) is a long-term lung problem.  The way your lungs work will never return to normal. Usually the condition gets worse over time. There are things you can do to keep yourself as healthy as possible.  Take over-the-counter and prescription medicines only as told by your doctor.  If you smoke, stop. Smoking makes the problem worse. This information is not intended to replace advice given to you by your health care provider. Make sure you discuss any questions you have with your health care provider. Document Revised: 07/17/2020 Document Reviewed: 07/17/2020 Elsevier Patient Education  2021 Elsevier Inc.   

## 2020-12-25 NOTE — Progress Notes (Signed)
Pt wore Bipap intermittently overnight for a total of ~ 2.5 hours. Pt tolerates Bipap well when placed, but he does not sleep for more than a couple hours at a time. Dayshift RN updated

## 2020-12-25 NOTE — Progress Notes (Signed)
Pt placed on CPAP.  Tolerating well.

## 2020-12-26 ENCOUNTER — Telehealth: Payer: Self-pay

## 2020-12-26 NOTE — Telephone Encounter (Signed)
Transition Care Management Follow-up Telephone Call  Date of discharge and from where: 12/25/2020-[place]  How have you been since you were released from the hospital? Doing ok. Using O2 as directed.  Any questions or concerns? No  Items Reviewed:  Did the pt receive and understand the discharge instructions provided? Yes   Medications obtained and verified? Yes   Other? Yes   Any new allergies since your discharge? No   Dietary orders reviewed? Yes  Do you have support at home? Yes   Home Care and Equipment/Supplies: Were home health services ordered? no If so, what is the name of the agency? n/a  Has the agency set up a time to come to the patient's home? not applicable Were any new equipment or medical supplies ordered?  No What is the name of the medical supply agency? n/a Were you able to get the supplies/equipment? not applicable Do you have any questions related to the use of the equipment or supplies? n/a  Functional Questionnaire: (I = Independent and D = Dependent) ADLs: I with assistance  Bathing/Dressing- I  Meal Prep- D  Eating- I  Maintaining continence- I  Transferring/Ambulation- I  Managing Meds- I  Follow up appointments reviewed:   PCP Hospital f/u appt confirmed? Yes  Scheduled to see Dr. Veto Kemps on 12/31/2020 @ 9:30.  Specialist Hospital f/u appt confirmed? Yes  Scheduled to see Tammy Parrett on 01/18/21 @ 10:30.  Are transportation arrangements needed? No   If their condition worsens, is the pt aware to call PCP or go to the Emergency Dept.? Yes  Was the patient provided with contact information for the PCP's office or ED? Yes  Was to pt encouraged to call back with questions or concerns? Yes

## 2020-12-27 ENCOUNTER — Telehealth: Payer: Self-pay | Admitting: Family Medicine

## 2020-12-27 DIAGNOSIS — J449 Chronic obstructive pulmonary disease, unspecified: Secondary | ICD-10-CM | POA: Diagnosis not present

## 2020-12-27 NOTE — Telephone Encounter (Signed)
Glad to hear he was approved for patient assistance for that medication! Thank you for the update.

## 2020-12-27 NOTE — Progress Notes (Signed)
Spoke with patient to confirm medication change from 12/23/2020 ED Kenneth [name].  Patient confirmed the ED stop Potassium Chloride 20 MEQ and Spironolactone 25 mg. Notified clinical pharmacist.  Everlean Cherry Clinical Pharmacist Assistant [phone]

## 2020-12-27 NOTE — Telephone Encounter (Signed)
Pt called to notify Dr. Veto Kemps & Trinna Post that he received the order for Daliresp and took his first dose today. He said it was mail order from [employer]. It appears it was ordered by pulmonary office.

## 2020-12-31 ENCOUNTER — Other Ambulatory Visit: Payer: Self-pay

## 2020-12-31 ENCOUNTER — Ambulatory Visit (INDEPENDENT_AMBULATORY_CARE_PROVIDER_SITE_OTHER): Payer: Medicare Other | Admitting: *Deleted

## 2020-12-31 ENCOUNTER — Encounter: Payer: Self-pay | Admitting: Family Medicine

## 2020-12-31 ENCOUNTER — Telehealth: Payer: Self-pay

## 2020-12-31 ENCOUNTER — Telehealth: Payer: Medicare Other

## 2020-12-31 ENCOUNTER — Ambulatory Visit (INDEPENDENT_AMBULATORY_CARE_PROVIDER_SITE_OTHER): Payer: Medicare Other | Admitting: Family Medicine

## 2020-12-31 VITALS — BP 138/74 | HR 79 | Temp 97.9°F | Ht 72.0 in | Wt 199.0 lb

## 2020-12-31 DIAGNOSIS — F418 Other specified anxiety disorders: Secondary | ICD-10-CM

## 2020-12-31 DIAGNOSIS — I5042 Chronic combined systolic (congestive) and diastolic (congestive) heart failure: Secondary | ICD-10-CM

## 2020-12-31 DIAGNOSIS — E1140 Type 2 diabetes mellitus with diabetic neuropathy, unspecified: Secondary | ICD-10-CM | POA: Diagnosis not present

## 2020-12-31 DIAGNOSIS — Z794 Long term (current) use of insulin: Secondary | ICD-10-CM

## 2020-12-31 DIAGNOSIS — J439 Emphysema, unspecified: Secondary | ICD-10-CM | POA: Diagnosis not present

## 2020-12-31 LAB — COMPREHENSIVE METABOLIC PANEL
ALT: 33 U/L (ref 0–53)
AST: 15 U/L (ref 0–37)
Albumin: 3.7 g/dL (ref 3.5–5.2)
Alkaline Phosphatase: 66 U/L (ref 39–117)
BUN: 16 mg/dL (ref 6–23)
CO2: 38 mEq/L — ABNORMAL HIGH (ref 19–32)
Calcium: 9 mg/dL (ref 8.4–10.5)
Chloride: 94 mEq/L — ABNORMAL LOW (ref 96–112)
Creatinine, Ser: 1.36 mg/dL (ref 0.40–1.50)
GFR: 56.98 mL/min — ABNORMAL LOW (ref >60.00)
Glucose, Bld: 234 mg/dL — ABNORMAL HIGH (ref 70–99)
Potassium: 4.2 mEq/L (ref 3.5–5.1)
Sodium: 138 mEq/L (ref 135–145)
Total Bilirubin: 0.3 mg/dL (ref 0.2–1.2)
Total Protein: 6 g/dL (ref 6.0–8.3)

## 2020-12-31 LAB — CBC
HCT: 32.4 % — ABNORMAL LOW (ref 39.0–52.0)
Hemoglobin: 10.2 g/dL — ABNORMAL LOW (ref 13.0–17.0)
MCHC: 31.4 g/dL (ref 30.0–36.0)
MCV: 88 fl (ref 78.0–100.0)
Platelets: 265 10*3/uL (ref 150.0–400.0)
RBC: 3.68 Mil/uL — ABNORMAL LOW (ref 4.22–5.81)
RDW: 14.9 % (ref 11.5–15.5)
WBC: 9.5 10*3/uL (ref 4.0–10.5)

## 2020-12-31 LAB — POCT GLUCOSE (DEVICE FOR HOME USE): Glucose Fasting, POC: 227 mg/dL — AB (ref 70–99)

## 2020-12-31 NOTE — Progress Notes (Signed)
[facility] [address] [place] [address] Dept: [phone] Dept Fax: [phone]  Office Visit  Subjective:    Patient ID: Kenneth [name], male    DOB: 12/19/1960, 60 y.o..   MRN: [id number]  Chief Complaint  Patient presents with  . Follow-up    2 week f/u DM and hospital f/u for COPD 12/23/20.   Average BS [phone].       History of Present Illness:  Patient is in today for hospital follow-up and reassessment of his diabetes. Kenneth [name] was recently admitted at [facility] with an exacerbation of his COPD. He was discharged on a prednisone course that he finished tomorrow. He had some elevated potassium in the hospital, so his spironolactone and potassium supplementation were placed on hold. Since returning home, he feels he has been doing fairly well. His oxygen concentrator is set at 3 lpm in the office, though he says he cuts this down when at home. He has finally been able to get started on Daliresp.   Kenneth [name] brought in a log of his blood pressures, weights, and blood sugars from home. Since discharge, his home BPs have been moderately elevated, His weight has been stable around 197 lbs. His blood sugars have mostly been in the 100s, with one occurrence of a low blood sugar (66) at 4 am one day after having a >500 blood sugar the previous afternoon.  Kenneth [name] notes he has calluses on his feet and wonders about a podiatry referral to address this.  Past Medical History: Patient Active Problem List   Diagnosis Date Noted  . Chronic respiratory failure with hypoxia ([place]) 12/07/2020  . Oral candidiasis 12/07/2020  . Acute on chronic respiratory failure with hypoxia ([place]) 10/23/2020  . Cardiac arrest ([place]) 10/04/2020  . Acute kidney injury superimposed on CKD ([place]) 09/23/2020  . Thiamine deficiency 07/02/2020  . Acute on chronic respiratory failure ([place]) 06/20/2020  . Obstructive sleep apnea 05/02/2020  . Hyperlipidemia   .  Chronic combined systolic and diastolic heart failure ([place]) 04/07/2020  . Chronic constipation 07/19/2019  . Snoring 03/29/2019  . Insomnia due to other mental disorder 11/15/2018  . Anticoagulant long-term use 10/21/2018  . Gastroesophageal reflux disease without esophagitis 08/10/2018  . Tobacco abuse 08/10/2018  . Chest pain 07/27/2018  . Essential hypertension 07/06/2018  . Chronic obstructive pulmonary disease ([place]) 07/06/2018  . Alcohol abuse 07/06/2018  . Paroxysmal atrial fibrillation ([place]) 11/28/2017  . Slow transit constipation 06/07/2016  . Chronic anemia 06/06/2016  . Hypertensive kidney disease with chronic kidney disease stage III ([place]) 06/06/2016  . Mixed anxiety and depressive disorder 06/05/2016  . Type 2 diabetes mellitus with diabetic neuropathy, with long-term current use of insulin ([place]) 06/05/2016  . Chronic obstructive pulmonary disease with (acute) exacerbation ([place]) 06/05/2016   Past Surgical History:  Procedure Laterality Date  . NO PAST SURGERIES     Family History  Problem Relation Age of Onset  . Diabetes Mother   . Heart disease Mother   . Diabetes Father   . Heart disease Father   . Diabetes Sister   . Heart disease Sister   . Kidney disease Sister   . Coronary artery disease Brother   . Liver disease Maternal Uncle    Outpatient Medications Prior to Visit  Medication Sig Dispense Refill  . acetaminophen (TYLENOL) 325 MG tablet Take 650 mg by mouth every 6 (six) hours as needed for mild pain or headache.    . albuterol (  PROVENTIL) (2.5 MG/3ML) 0.083% nebulizer solution Take 3 mLs (2.5 mg total) by nebulization every 6 (six) hours as needed for shortness of breath.    [name] amLODipine (NORVASC) 5 MG tablet Take 5 mg by mouth every morning.    . ARIPiprazole (ABILIFY) 5 MG tablet Take 5 mg by mouth daily.    . Blood Glucose Monitoring Suppl (ONE TOUCH ULTRA 2) w/Device KIT Use to test blood sugars 1-2 times daily. 1 kit 0  . busPIRone (BUSPAR) 15 MG  tablet Take 1 tablet (15 mg total) by mouth 2 (two) times daily. 180 tablet 1  . carvedilol (COREG) 12.5 MG tablet Take 1 tablet (12.5 mg total) by mouth 2 (two) times daily with a meal. 180 tablet 3  . clotrimazole (MYCELEX) 10 MG troche Take 1 tablet (10 mg total) by mouth 5 (five) times daily. 35 Troche 0  . DM-GG & DM-APAP-CPM (CORICIDIN HBP DAY/NIGHT COLD) 10-20 &15-200-2 MG MISC Take 1 tablet by mouth every 12 (twelve) hours as needed (cold symptoms).    [name] ELIQUIS 5 MG TABS tablet TAKE ONE TABLET BY MOUTH EVERY MORNING and TAKE ONE TABLET BY MOUTH EVERY EVENING (Patient taking differently: Take 5 mg by mouth 2 (two) times daily.) 180 tablet 1  . famotidine (PEPCID) 20 MG tablet Take 20 mg by mouth daily.    [name] FLUoxetine (PROZAC) 20 MG capsule TAKE 3 CAPSULES BY MOUTH  DAILY 270 capsule 3  . Fluticasone-Umeclidin-Vilant (TRELEGY ELLIPTA) 100-62.5-25 MCG/INH AEPB Inhale 1 puff into the lungs daily. 60 each 5  . furosemide (LASIX) 40 MG tablet Take 1 tablet (40 mg total) by mouth 2 (two) times daily. 60 tablet 11  . gabapentin (NEURONTIN) 100 MG capsule Take 2 capsules (200 mg total) by mouth 3 (three) times daily. 180 capsule 3  . glucose blood (ONETOUCH ULTRA) test strip USE TO TEST BLOOD SUGAR 1-2 TIMES DAILY 100 each 11  . guaiFENesin (MUCINEX) 600 MG 12 hr tablet Take 1 tablet (600 mg total) by mouth 2 (two) times daily. 30 tablet 0  . hydrALAZINE (APRESOLINE) 25 MG tablet Take 1 tablet (25 mg total) by mouth 3 (three) times daily. 90 tablet 5  . insulin glargine (LANTUS SOLOSTAR) 100 UNIT/ML Solostar Pen Inject 55 Units into the skin at bedtime. If fasting blood sugars remain >150 for 1 week, increase insulin dose by 5 units. Maximum of 80 units. 15 mL 2  . Insulin Pen Needle (PEN NEEDLES) 31G X 6 MM MISC 1 application by Does not apply route at bedtime. 100 each 1  . ipratropium-albuterol (DUONEB) 0.5-2.5 (3) MG/3ML SOLN Take 3 mLs by nebulization every 6 (six) hours as needed. (Patient  taking differently: Take 3 mLs by nebulization every 6 (six) hours as needed (For shortness of breath).) 360 mL 0  . isosorbide mononitrate (IMDUR) 30 MG 24 hr tablet Take 30 mg by mouth daily.    . Lancets (ONETOUCH ULTRASOFT) lancets USE TO TEST BLOOD SUGAR 1-2 TIMES DAILY 100 each 11  . lubiprostone ([name]) 8 MCG capsule Take 1 capsule (8 mcg total) by mouth 2 (two) times daily with a meal. 60 capsule 0  . Multiple Vitamin (MULTIVITAMIN WITH MINERALS) TABS tablet Take 1 tablet by mouth daily.    . nitroGLYCERIN (NITROSTAT) 0.4 MG SL tablet DISSOLVE 1 TABLET UNDER THE TONGUE EVERY 5 MINUTES AS&nbsp;&nbsp;NEEDED FOR CHEST PAIN. MAX&nbsp;&nbsp;OF 3 TABLETS IN 15 MINUTES. CALL 911 IF PAIN PERSISTS. (Patient taking differently: Place 0.4 mg under the tongue every 5 (five)  minutes as needed for chest pain.) 75 tablet 4  . nystatin (MYCOSTATIN) 100000 UNIT/ML suspension Take 5 mLs (500,000 Units total) by mouth 4 (four) times daily. 100 mL 0  . pantoprazole (PROTONIX) 40 MG tablet TAKE ONE TABLET BY MOUTH EVERY MORNING 30 tablet 4  . predniSONE (DELTASONE) 10 MG tablet TAKE 4 TABLETS (40 MG TOTAL) BY MOUTH DAILY WITH BREAKFAST FOR 3 DAYS. 12 tablet 0  . predniSONE (DELTASONE) 20 MG tablet Take 2 tablets (40 mg total) by mouth daily with breakfast for 7 days. 14 tablet 0  . Roflumilast (DALIRESP) 250 MCG TABS Take 250 mcg by mouth daily. 30 tablet 11  . rosuvastatin (CRESTOR) 20 MG tablet TAKE ONE TABLET BY MOUTH ONCE DAILY 30 tablet 3   No facility-administered medications prior to visit.   Allergies  Allergen Reactions  . Lisinopril Swelling  . Other Other (See Comments)    Lettuce : rash  . Tomato Rash     Objective:   Today's Vitals   12/31/20 0931  BP: 138/74  Pulse: 79  Temp: 97.9 F (36.6 C)  TempSrc: Temporal  SpO2: 98%  Weight: 199 lb (90.3 kg)  Height: 6' (1.829 m)   Body mass index is 26.99 kg/m.   General: Well developed, well nourished. No acute distress. Lungs:  Distant breath sounds with some upper airway mucous. CV: RRR without murmurs or rubs. Pulses 2+ bilaterally. Feet: The right great toe nail is thickened and crumbling. There is some peeling of the foot present and moderate callus.  Health Maintenance Due  Topic Date Due  . COLONOSCOPY (Pts 45-21yr Insurance coverage will need to be confirmed)  09/23/2019  . OPHTHALMOLOGY EXAM  05/12/2020     Assessment & Plan:   1. Type 2 diabetes mellitus with diabetic neuropathy, with long-term current use of insulin (HCC) Blood sugars continue to show improvement, despite the elevated HbA1c from the hospital last week. We will want to reassess this in 2-3 months. In the meantime, I would like to see how his blood sugars are doing once he is off his current course of prednisone prior to making further insulin adjustments. I will have him return in 3 weeks.  - POCT Glucose (Device for Home Use) - Ambulatory referral to Podiatry  2. Pulmonary emphysema, unspecified emphysema type ([facility] 3. Chronic combined systolic and diastolic heart failure ([facility] I reviewed the discharge summary from his recent hospitalization. Pre request, we check follow-up labs. He will continue to hold on his spironolactone and potassium pending his upcoming cardiology appointment next month.  - Comprehensive metabolic panel - CBC  SHaydee Salter MD

## 2020-12-31 NOTE — Chronic Care Management (AMB) (Signed)
Chronic Care Management    Clinical Social Work Note  12/31/2020 Name: Kenneth [name] MRN: [id number] DOB: 01-31-1961 Kendon Sedeno is a 60 y.o. year old male who is a primary care patient of [name], [name], MD. The CCM team was consulted to assist the patient with chronic disease management and/or care coordination needs related to: [place] and Resources.  Engaged with patient by telephone for follow up visit in response to provider referral for social work chronic care management and care coordination services.  Consent to Services:  The patient was given information about Chronic Care Management services, agreed to services, and gave verbal consent prior to initiation of services.  Please see initial visit note for detailed documentation.  Patient agreed to services and consent obtained.  Assessment: Review of patient past medical history, allergies, medications, and health status, including review of relevant consultants reports was performed today as part of a comprehensive evaluation and provision of chronic care management and care coordination services.    SDOH (Social Determinants of Health) assessments and interventions performed:  SDOH Interventions   Flowsheet Row Most Recent Value  SDOH Interventions   Depression Interventions/Treatment  Counseling     Advanced Directives Status: Not addressed in this encounter.  CCM Care Plan  Allergies  Allergen Reactions  . Lisinopril Swelling  . Other Other (See Comments)    Lettuce : rash  . Tomato Rash    Outpatient Encounter Medications as of 12/31/2020  Medication Sig Note  . acetaminophen (TYLENOL) 325 MG tablet Take 650 mg by mouth every 6 (six) hours as needed for mild pain or headache.   . albuterol (PROVENTIL) (2.5 MG/3ML) 0.083% nebulizer solution Take 3 mLs (2.5 mg total) by nebulization every 6 (six) hours as needed for shortness of breath.   [name] amLODipine (NORVASC) 5 MG tablet Take 5 mg by mouth every  morning.   . ARIPiprazole (ABILIFY) 5 MG tablet Take 5 mg by mouth daily. 10/29/2020: Prescribed by  Madison Hickman, NP   . Blood Glucose Monitoring Suppl (ONE TOUCH ULTRA 2) w/Device KIT Use to test blood sugars 1-2 times daily.   . busPIRone (BUSPAR) 15 MG tablet Take 1 tablet (15 mg total) by mouth 2 (two) times daily. 10/29/2020: Prescribed by  Madison Hickman, NP    . carvedilol (COREG) 12.5 MG tablet Take 1 tablet (12.5 mg total) by mouth 2 (two) times daily with a meal.   . clotrimazole (MYCELEX) 10 MG troche Take 1 tablet (10 mg total) by mouth 5 (five) times daily.   [name] DM-GG & DM-APAP-CPM (CORICIDIN HBP DAY/NIGHT COLD) 10-20 &15-200-2 MG MISC Take 1 tablet by mouth every 12 (twelve) hours as needed (cold symptoms).   [name] ELIQUIS 5 MG TABS tablet TAKE ONE TABLET BY MOUTH EVERY MORNING and TAKE ONE TABLET BY MOUTH EVERY EVENING (Patient taking differently: Take 5 mg by mouth 2 (two) times daily.)   . famotidine (PEPCID) 20 MG tablet Take 20 mg by mouth daily.   [name] FLUoxetine (PROZAC) 20 MG capsule TAKE 3 CAPSULES BY MOUTH  DAILY 10/29/2020: Prescribed by  Madison Hickman, NP   . Fluticasone-Umeclidin-Vilant (TRELEGY ELLIPTA) 100-62.5-25 MCG/INH AEPB Inhale 1 puff into the lungs daily.   . furosemide (LASIX) 40 MG tablet Take 1 tablet (40 mg total) by mouth 2 (two) times daily.   [name] gabapentin (NEURONTIN) 100 MG capsule Take 2 capsules (200 mg total) by mouth 3 (three) times daily.   [name] glucose blood (ONETOUCH ULTRA) test strip USE TO  TEST BLOOD SUGAR 1-2 TIMES DAILY   . guaiFENesin (MUCINEX) 600 MG 12 hr tablet Take 1 tablet (600 mg total) by mouth 2 (two) times daily.   . hydrALAZINE (APRESOLINE) 25 MG tablet Take 1 tablet (25 mg total) by mouth 3 (three) times daily.   . insulin glargine (LANTUS SOLOSTAR) 100 UNIT/ML Solostar Pen Inject 55 Units into the skin at bedtime. If fasting blood sugars remain >150 for 1 week, increase insulin dose by 5 units. Maximum of 80 units.   . Insulin Pen Needle  (PEN NEEDLES) 31G X 6 MM MISC 1 application by Does not apply route at bedtime.   [name] ipratropium-albuterol (DUONEB) 0.5-2.5 (3) MG/3ML SOLN Take 3 mLs by nebulization every 6 (six) hours as needed. (Patient taking differently: Take 3 mLs by nebulization every 6 (six) hours as needed (For shortness of breath).)   . isosorbide mononitrate (IMDUR) 30 MG 24 hr tablet Take 30 mg by mouth daily.   . Lancets (ONETOUCH ULTRASOFT) lancets USE TO TEST BLOOD SUGAR 1-2 TIMES DAILY   . lubiprostone ([name]) 8 MCG capsule Take 1 capsule (8 mcg total) by mouth 2 (two) times daily with a meal.   . Multiple Vitamin (MULTIVITAMIN WITH MINERALS) TABS tablet Take 1 tablet by mouth daily.   . nitroGLYCERIN (NITROSTAT) 0.4 MG SL tablet DISSOLVE 1 TABLET UNDER THE TONGUE EVERY 5 MINUTES AS  NEEDED FOR CHEST PAIN. MAX  OF 3 TABLETS IN 15 MINUTES. CALL 911 IF PAIN PERSISTS. (Patient taking differently: Place 0.4 mg under the tongue every 5 (five) minutes as needed for chest pain.) 10/04/2020: Last filled 07/25/20  . nystatin (MYCOSTATIN) 100000 UNIT/ML suspension Take 5 mLs (500,000 Units total) by mouth 4 (four) times daily.   . pantoprazole (PROTONIX) 40 MG tablet TAKE ONE TABLET BY MOUTH EVERY MORNING   . predniSONE (DELTASONE) 10 MG tablet TAKE 4 TABLETS (40 MG TOTAL) BY MOUTH DAILY WITH BREAKFAST FOR 3 DAYS.   [name] predniSONE (DELTASONE) 20 MG tablet Take 2 tablets (40 mg total) by mouth daily with breakfast for 7 days.   . Roflumilast (DALIRESP) 250 MCG TABS Take 250 mcg by mouth daily. 12/27/2020: Patient receives through [place] Patient Assistance through December 2022.  . rosuvastatin (CRESTOR) 20 MG tablet TAKE ONE TABLET BY MOUTH ONCE DAILY    No facility-administered encounter medications on file as of 12/31/2020.    Patient Active Problem List   Diagnosis Date Noted  . Chronic respiratory failure with hypoxia ([place]) 12/07/2020  . Oral candidiasis 12/07/2020  . Acute on chronic respiratory failure with hypoxia ([place])  10/23/2020  . Cardiac arrest ([place]) 10/04/2020  . Acute kidney injury superimposed on CKD ([place]) 09/23/2020  . Thiamine deficiency 07/02/2020  . Acute on chronic respiratory failure ([place]) 06/20/2020  . Obstructive sleep apnea 05/02/2020  . Hyperlipidemia   . Chronic combined systolic and diastolic heart failure ([place]) 04/07/2020  . Chronic constipation 07/19/2019  . Snoring 03/29/2019  . Insomnia due to other mental disorder 11/15/2018  . Anticoagulant long-term use 10/21/2018  . Gastroesophageal reflux disease without esophagitis 08/10/2018  . Tobacco abuse 08/10/2018  . Chest pain 07/27/2018  . Essential hypertension 07/06/2018  . Chronic obstructive pulmonary disease ([place]) 07/06/2018  . Alcohol abuse 07/06/2018  . Paroxysmal atrial fibrillation ([place]) 11/28/2017  . Slow transit constipation 06/07/2016  . Chronic anemia 06/06/2016  . Hypertensive kidney disease with chronic kidney disease stage III ([place]) 06/06/2016  . Mixed anxiety and depressive disorder 06/05/2016  . Type 2 diabetes mellitus with  diabetic neuropathy, with long-term current use of insulin ([place]) 06/05/2016  . Chronic obstructive pulmonary disease with (acute) exacerbation ([place]) 06/05/2016   Conditions to be addressed/monitored: DMII, Anxiety and Depression; Mental Health Concerns   Care Plan : LCSW Plan of Care  Updates made by Deirdre Peer, LCSW since 12/31/2020 12:00 AM    Problem: Knowledge barriers related to depression   Priority: High  Note:    Current barriers:   . Chronic Mental Health needs related to   chronic depression . Currently working towards self management of needs related to mental health conditions. . Needs Support, Education, and Care Coordination in order to meet unmet needs  . Recently accepted under Palliative Care and adjusting to new goals/coping Clinical Goal(s): Over the next 21 days, patient will work with SW to reduce or manage symptoms of depression until connected for ongoing  counseling.  Clinical Interventions:  . Assessed patient's previous treatment, needs, coping skills, current treatment, support system and barriers to care . Patient interviewed and appropriate assessments performed or reviewed: PHQ 9;Suicidal Ideation/Homicidal Ideation: No . Provided basic mental health support, education and interventions  . Reviewed mental health medications with patient prescribed by PCP and discussed compliance  . Other interventions include: Motivational Interviewing, Solution-Focused Strategies, Mindfulness or Psychologist, educational, Emotional/Supportive Counseling, and Problem Solving  . Collaboration with PCP regarding development and update of comprehensive plan of care as evidenced by provider attestation and co-signature . Inter-disciplinary care team collaboration (see longitudinal plan of care) Patient Goals/Self-Care Activities:  . Over the next 21 days . Continue with bi-weekly counseling visits . Attempt in-person visits as able . Set goals with counselor   Follow Up Plan: 12/03/2020   Long-Range Goal: Develop long term plan for self management of depression   Start Date: 11/12/2020  This Visit's Progress: On track  Recent Progress: On track  Priority: High  Note:   Current Barriers:  . Chronic Mental Health needs related to depression and health conditions-pt has been a counseling sessions (virtually and some in person) and feels this is helping to "give me a sense of direction" and to set goals. . Per pt, he is attending in person and about 3 times per month . Pt is developing coping skills around his anxiety/depression and is "motivated" by the therapy . Suicidal Ideation/Homicidal Ideation: No  Clinical Social Work Goal(s):  . patient will work with CSW by telephone and with Counselor to reduce/manage symptoms related to depression . Patient will take medications as ordered . Patient will work on his "letting things go" and "release" of his anxiety as  he anticipates things agitating him  Interventions: Patient reports a family gathering over the weekend "went well" and he had no issues with anxiety; stating, "managed it well".  He feels he is making positive strides with the counselor and reports he continues to take meds as prescribed also.  . Patient interviewed and appropriate assessments performed . Discussed plans with patient for ongoing care management follow up and provided patient with direct contact information for care management team . Collaborated with [facility] regarding pt's inquiring about the inhaler he "can't afford and was waiting to hear about" . Motivational Interviewing . Solution-Focused Strategies . Emotional/Supportive Counseling . Psychoeducation and/or Health Education . Problem Solving  Patient Self Care Activities:  . Self administers medications as prescribed, Attends all scheduled provider appointments, Calls provider office for new concerns or questions, and Motivation for treatment  Patient Coping Strengths:  . Supportive Relationships . Hopefulness .  Able to Communicate Effectively  Patient Self Care Deficits:  . Recent transition to palliative care support and pt's need for understanding/accepting their plan of care  Patient Goals:  Continue with on-going/scheduled counseling visits Continue taking meds as directed. Attempt in-person visits as able Set goals with counselor and work on reaching these goals  Follow Up Plan: SW will follow up with patient by phone over the next  4 weeks.       Follow Up Plan: Appointment scheduled for SW follow up with client by phone on: 01/30/2021 Eduard Clos MSW, LCSW Licensed Clinical Social Worker [place] [phone]

## 2020-12-31 NOTE — Progress Notes (Signed)
Left Voice message  to confirmed patient telephone appointment on 01/01/2021 for CCM at 9:00 am with Angelena Sole the Clinical pharmacist.   Everlean Cherry Clinical Pharmacist Assistant [phone]

## 2020-12-31 NOTE — Patient Instructions (Signed)
Visit Information  PATIENT GOALS: Goals Addressed            This Visit's Progress   . Continue with on-going counseling appointments       Timeframe:  Long-Range Goal Priority:  High Start Date:    11/12/20                         Expected End Date  02/19/2021         Follow Up Date 01/30/2021   Continue with on-going/scheduled counseling visits Continue taking meds as directed. Attempt in-person visits as able Set goals with counselor and work on reaching these goals    Why is this important?    Beating depression may take some time.   If you don't feel better right away, don't give up on your treatment plan.         The patient verbalized understanding of instructions, educational materials, and care plan provided today and declined offer to receive copy of patient instructions, educational materials, and care plan.   Telephone follow up appointment with care management team member scheduled for:01/30/2021  [name] MSW, LCSW Licensed Clinical Social Worker [facility] [place] [phone] Visit Information  PATIENT GOALS: Goals Addressed            This Visit's Progress   . Continue with on-going counseling appointments       Timeframe:  Long-Range Goal Priority:  High Start Date:    11/12/20                         Expected End Date  02/19/2021         Follow Up Date 01/30/2021   Continue with on-going/scheduled counseling visits Continue taking meds as directed. Attempt in-person visits as able Set goals with counselor and work on reaching these goals    Why is this important?    Beating depression may take some time.   If you don't feel better right away, don't give up on your treatment plan.         The patient verbalized understanding of instructions, educational materials, and care plan provided today and declined offer to receive copy of patient instructions, educational materials, and care plan.   Telephone follow up appointment  with care management team member scheduled for:01/30/2021  [name] MSW, LCSW Licensed Clinical Social Worker [facility] [place] [phone] Visit Information  PATIENT GOALS: Goals Addressed            This Visit's Progress   . Continue with on-going counseling appointments       Timeframe:  Long-Range Goal Priority:  High Start Date:    11/12/20                         Expected End Date  02/19/2021         Follow Up Date 01/30/2021   Continue with on-going/scheduled counseling visits Continue taking meds as directed. Attempt in-person visits as able Set goals with counselor and work on reaching these goals    Why is this important?    Beating depression may take some time.   If you don't feel better right away, don't give up on your treatment plan.         The patient verbalized understanding of instructions, educational materials, and care plan provided today and declined offer to receive copy of patient instructions, educational materials, and care plan.  Telephone follow up appointment with care management team member scheduled for:  01/30/2021 [name] MSW, LCSW Licensed Clinical Social Worker [employer] [phone]

## 2021-01-01 ENCOUNTER — Ambulatory Visit: Payer: Medicare Other

## 2021-01-01 DIAGNOSIS — J449 Chronic obstructive pulmonary disease, unspecified: Secondary | ICD-10-CM

## 2021-01-01 DIAGNOSIS — Z794 Long term (current) use of insulin: Secondary | ICD-10-CM | POA: Diagnosis not present

## 2021-01-01 DIAGNOSIS — J439 Emphysema, unspecified: Secondary | ICD-10-CM | POA: Diagnosis not present

## 2021-01-01 DIAGNOSIS — I5042 Chronic combined systolic (congestive) and diastolic (congestive) heart failure: Secondary | ICD-10-CM

## 2021-01-01 DIAGNOSIS — F418 Other specified anxiety disorders: Secondary | ICD-10-CM | POA: Diagnosis not present

## 2021-01-01 DIAGNOSIS — I152 Hypertension secondary to endocrine disorders: Secondary | ICD-10-CM

## 2021-01-01 DIAGNOSIS — E1140 Type 2 diabetes mellitus with diabetic neuropathy, unspecified: Secondary | ICD-10-CM

## 2021-01-01 DIAGNOSIS — I480 Paroxysmal atrial fibrillation: Secondary | ICD-10-CM | POA: Diagnosis not present

## 2021-01-01 DIAGNOSIS — I5031 Acute diastolic (congestive) heart failure: Secondary | ICD-10-CM | POA: Diagnosis not present

## 2021-01-01 DIAGNOSIS — E785 Hyperlipidemia, unspecified: Secondary | ICD-10-CM

## 2021-01-01 DIAGNOSIS — E1169 Type 2 diabetes mellitus with other specified complication: Secondary | ICD-10-CM

## 2021-01-01 DIAGNOSIS — E1159 Type 2 diabetes mellitus with other circulatory complications: Secondary | ICD-10-CM

## 2021-01-01 NOTE — Progress Notes (Signed)
Chronic Care Management Pharmacy Note  01/01/2021 Name:  Kenneth [name] MRN:  [id number] DOB:  [birth year]  Subjective: Kenneth [name] is an 60 y.o. year old male who is a primary patient of [name], [name], MD.  The CCM team was consulted for assistance with disease management and care coordination needs.    Engaged with patient by telephone for follow up visit in response to provider referral for pharmacy case management and/or care coordination services.   Consent to Services:  The patient was given information about Chronic Care Management services, agreed to services, and gave verbal consent prior to initiation of services.  Please see initial visit note for detailed documentation.   Patient Care Team: [name], MD as PCP - General (Family Medicine) Buford Dresser, MD as PCP - Cardiology (Cardiology) Germaine Pomfret, [facility] as Pharmacist (Pharmacist) Madison Hickman, NP as Nurse Practitioner (Psychiatry) Dannielle Karvonen, RN as Case Manager [place], [place] as Registered Nurse [facility] and Palliative Medicine) Deirdre Peer, LCSW as Social Worker (Licensed Clinical Social Worker) Parrett, Fonnie Mu, NP as Nurse Practitioner (Pulmonary Disease)  Recent office visits: 12/31/20: Patient presented to Dr. Gena Fray for follow-up. Continue to hold spironolactone and potassium until cardiology follow-up.  12/19/20: Patient presented to Dr. Gena Fray for follow-up. Lantus increased to 55 units daily.   Recent consult visits: 12/07/20: Patient presented to Tammy Parrett (Pulmonolgy) for follow-up. Patient started on Mycelex troche and nystatin rinse for candidiasis.   Hospital visits: 4/3-4/5: Patient hospitalized for COPD exacerbation. Potassium, Spironolactone stopped due to hyperkalemia. Patient discharged on Prednisone course for 7 days.   Objective:  Lab Results  Component Value Date   CREATININE 1.36 12/31/2020   BUN 16 12/31/2020   GFR 56.98 (L) 12/31/2020    GFRNONAA 58 (L) 12/25/2020   GFRAA 85 07/05/2020   NA 138 12/31/2020   K 4.2 12/31/2020   CALCIUM 9.0 12/31/2020   CO2 38 (H) 12/31/2020   GLUCOSE 234 (H) 12/31/2020    Lab Results  Component Value Date/Time   HGBA1C 14.4 (H) 12/23/2020 11:22 PM   HGBA1C 6.3 (H) 09/13/2020 04:08 AM   GFR 56.98 (L) 12/31/2020 10:06 AM   GFR 50.31 (L) 10/30/2020 08:00 AM   MICROALBUR <0.7 07/06/2018 01:55 PM    Last diabetic Eye exam: No results found for: HMDIABEYEEXA  Last diabetic Foot exam: No results found for: HMDIABFOOTEX   Lab Results  Component Value Date   CHOL 226 (H) 07/06/2018   HDL 68.40 07/06/2018   LDLCALC 119 (H) 07/06/2018   TRIG 275 (H) 10/05/2020   CHOLHDL 3 07/06/2018    Hepatic Function Latest Ref Rng & Units 12/31/2020 12/23/2020 10/23/2020  Total Protein 6.0 - 8.3 g/dL 6.0 6.7 6.2(L)  Albumin 3.5 - 5.2 g/dL 3.7 3.7 3.4(L)  AST 0 - 37 U/L 15 26 32  ALT 0 - 53 U/L 33 39 34  Alk Phosphatase 39 - 117 U/L 66 91 138(H)  Total Bilirubin 0.2 - 1.2 mg/dL 0.3 0.4 0.8  Bilirubin, Direct 0.0 - 0.2 mg/dL - - -    Lab Results  Component Value Date/Time   TSH 4.32 07/06/2018 01:55 PM    CBC Latest Ref Rng & Units 12/31/2020 12/25/2020 12/23/2020  WBC 4.0 - 10.5 K/uL 9.5 11.5(H) 11.6(H)  Hemoglobin 13.0 - 17.0 g/dL 10.2(L) 9.4(L) 10.3(L)  Hematocrit 39.0 - 52.0 % 32.4(L) 30.7(L) 34.4(L)  Platelets 150.0 - 400.0 K/uL 265.0 235 245    No results found for: VD25OH  Clinical  ASCVD: No  The 10-year ASCVD risk score Mikey Bussing DC Jr., et al., 2013) is: 39.1%   Values used to calculate the score:     Age: 44 years     Sex: Male     Is Non-Hispanic African American: Yes     Diabetic: Yes     Tobacco smoker: Yes     Systolic Blood Pressure: 409 mmHg     Is BP treated: Yes     HDL Cholesterol: 68.4 mg/dL     Total Cholesterol: 226 mg/dL    Depression screen [facility] 2/9 12/31/2020 11/14/2020 11/07/2020  Decreased Interest 0 1 1  Down, Depressed, Hopeless 0 0 1  PHQ - 2 Score 0 1 2  Altered  sleeping 1 - 3  Tired, decreased energy 0 - 2  Change in appetite 1 - 2  Feeling bad or failure about yourself  0 - 1  Trouble concentrating 1 - 1  Moving slowly or fidgety/restless 1 - 1  Suicidal thoughts 0 - 0  PHQ-9 Score 4 - 12  Difficult doing work/chores Not difficult at all - Somewhat difficult  Some recent data might be hidden    Social History   Tobacco Use  Smoking Status Former Smoker  . Packs/day: 2.00  . Years: 30.00  . Pack years: 60.00  . Types: Cigarettes  . Quit date: 06/2020  . Years since quitting: 0.5  Smokeless Tobacco Never Used  Tobacco Comment   smokes 2-3cigs per day   BP Readings from Last 3 Encounters:  12/31/20 138/74  12/25/20 (!) 176/93  12/19/20 (!) 156/82   Pulse Readings from Last 3 Encounters:  12/31/20 79  12/25/20 79  12/19/20 81   Wt Readings from Last 3 Encounters:  12/31/20 199 lb (90.3 kg)  12/25/20 194 lb 4.8 oz (88.1 kg)  12/19/20 194 lb 3.2 oz (88.1 kg)   BMI Readings from Last 3 Encounters:  12/31/20 26.99 kg/m  12/25/20 26.35 kg/m  12/19/20 26.34 kg/m    Assessment/Interventions: Review of patient past medical history, allergies, medications, health status, including review of consultants reports, laboratory and other test data, was performed as part of comprehensive evaluation and provision of chronic care management services.   SDOH:  (Social Determinants of Health) assessments and interventions performed: Yes SDOH Interventions   Flowsheet Row Most Recent Value  SDOH Interventions   Financial Strain Interventions Intervention Not Indicated      CCM Care Plan  Allergies  Allergen Reactions  . Lisinopril Swelling  . Other Other (See Comments)    Lettuce : rash  . Tomato Rash    Medications Reviewed Today    Reviewed by [name], MD (Physician) on 12/31/20 at 1001  Med List Status: <None>  Medication Order Taking? Sig Documenting Provider Last Dose Status Informant  acetaminophen (TYLENOL) 325  MG tablet [id number] Yes Take 650 mg by mouth every 6 (six) hours as needed for mild pain or headache. [provider] Taking Active Self  albuterol (PROVENTIL) (2.5 MG/3ML) 0.083% nebulizer solution [id number] Yes Take 3 mLs (2.5 mg total) by nebulization every 6 (six) hours as needed for shortness of breath. Aline August, MD Taking Active   amLODipine (NORVASC) 5 MG tablet [id number] Yes Take 5 mg by mouth every morning. [provider] Taking Active Self  ARIPiprazole (ABILIFY) 5 MG tablet [id number] Yes Take 5 mg by mouth daily. [provider] Taking Active Self           Med Note (Michaelle Birks,  Ayleen Mckinstry A   Mon Oct 29, 2020  8:13 AM) Prescribed by  Madison Hickman, NP   Blood Glucose Monitoring Suppl (ONE TOUCH ULTRA 2) w/Device KIT [id number] Yes Use to test blood sugars 1-2 times daily. [name], MD Taking Active Self  busPIRone (BUSPAR) 15 MG tablet [id number] Yes Take 1 tablet (15 mg total) by mouth 2 (two) times daily. [name], MD Taking Active Self           Med Note Michaelle Birks, Alda Lea Oct 29, 2020  8:13 AM) Prescribed by  Madison Hickman, NP    carvedilol (COREG) 12.5 MG tablet [id number] Yes Take 1 tablet (12.5 mg total) by mouth 2 (two) times daily with a meal. Buford Dresser, MD Taking Active Self           Med Note ([place], [place] Oct 23, 2020  1:21 PM)    clotrimazole (MYCELEX) 10 MG troche [id number] Yes Take 1 tablet (10 mg total) by mouth 5 (five) times daily. Parrett, Fonnie Mu, NP Taking Active Self  DM-GG & DM-APAP-CPM (CORICIDIN HBP DAY/NIGHT COLD) 10-20 &15-200-2 MG MISC [id number] Yes Take 1 tablet by mouth every 12 (twelve) hours as needed (cold symptoms). [provider] Taking Active Self  ELIQUIS 5 MG TABS tablet [id number] Yes TAKE ONE TABLET BY MOUTH EVERY MORNING and TAKE ONE TABLET BY MOUTH EVERY EVENING  Patient taking differently: Take 5 mg by mouth 2 (two) times daily.   Buford Dresser, MD Taking Active Self  famotidine (PEPCID) 20 MG tablet [id number] Yes Take 20 mg by mouth daily. [provider] Taking Active Self  FLUoxetine (PROZAC) 20 MG capsule [id number] Yes TAKE 3 CAPSULES BY MOUTH  DAILY [name], MD Taking Active Self           Med Note Daron Offer A   Mon Oct 29, 2020  8:15 AM) Prescribed by  Madison Hickman, NP   Fluticasone-Umeclidin-Vilant (TRELEGY ELLIPTA) 100-62.5-25 MCG/INH AEPB [id number] Yes Inhale 1 puff into the lungs daily. Collene Gobble, MD Taking Active Self           Med Note ([place], [place] Oct 23, 2020  1:23 PM)    furosemide (LASIX) 40 MG tablet [id number] Yes Take 1 tablet (40 mg total) by mouth 2 (two) times daily. Buford Dresser, MD Taking Active Self           Med Note ([place], [place] Oct 23, 2020  1:23 PM)    gabapentin (NEURONTIN) 100 MG capsule [id number] Yes Take 2 capsules (200 mg total) by mouth 3 (three) times daily. [name], MD Taking Active Self  glucose blood [facility] ULTRA) test strip [id number] Yes USE TO TEST BLOOD SUGAR 1-2 TIMES DAILY [name], MD Taking Active Self  guaiFENesin (MUCINEX) 600 MG 12 hr tablet [id number] Yes Take 1 tablet (600 mg total) by mouth 2 (two) times daily. Elmarie Shiley, MD Taking Active Self           Med Note ([place], [place] Oct 23, 2020  1:22 PM)    hydrALAZINE (APRESOLINE) 25 MG tablet [id number] Yes Take 1 tablet (25 mg total) by mouth 3 (three) times daily. [name], MD Taking Active Self  insulin glargine (LANTUS SOLOSTAR) 100 UNIT/ML Solostar Pen [id number] Yes Inject 55 Units into the skin at bedtime. If fasting blood sugars remain >150 for 1 week,  increase insulin dose by 5 units. Maximum of 80 units. [name], MD Taking Active Self  Insulin Pen Needle (PEN NEEDLES) 31G X 6 MM MISC [id number] Yes 1 application by Does not apply route at bedtime. Nche, Charlene Brooke, NP Taking Active  Self  ipratropium-albuterol (DUONEB) 0.5-2.5 (3) MG/3ML SOLN [id number] Yes Take 3 mLs by nebulization every 6 (six) hours as needed.  Patient taking differently: Take 3 mLs by nebulization every 6 (six) hours as needed (For shortness of breath).   Elmarie Shiley, MD Taking Active            Med Note ([place], [place] Oct 23, 2020  1:22 PM)    isosorbide mononitrate (IMDUR) 30 MG 24 hr tablet [id number] Yes Take 30 mg by mouth daily. [provider] Taking Active Self           Med Note ([place], [place]   Tue Oct 23, 2020  1:24 PM)    Lancets [facility] ULTRASOFT) lancets [id number] Yes USE TO TEST BLOOD SUGAR 1-2 TIMES DAILY [name], MD Taking Active Self  lubiprostone ([name]) 8 MCG capsule [id number] Yes Take 1 capsule (8 mcg total) by mouth 2 (two) times daily with a meal. [name], MD Taking Active Self  Multiple Vitamin (MULTIVITAMIN WITH MINERALS) TABS tablet [id number] Yes Take 1 tablet by mouth daily. [provider] Taking Active Self  nitroGLYCERIN (NITROSTAT) 0.4 MG SL tablet [id number] Yes DISSOLVE 1 TABLET UNDER THE TONGUE EVERY 5 MINUTES AS  NEEDED FOR CHEST PAIN. MAX  OF 3 TABLETS IN 15 MINUTES. CALL 911 IF PAIN PERSISTS.  Patient taking differently: Place 0.4 mg under the tongue every 5 (five) minutes as needed for chest pain.   [name], MD Taking Active            Med Note Alvino [place], [place] Left Oct 04, 2020  1:52 PM) Last filled 07/25/20  nystatin (MYCOSTATIN) 100000 UNIT/ML suspension [id number] Yes Take 5 mLs (500,000 Units total) by mouth 4 (four) times daily. Parrett, Fonnie Mu, NP Taking Active Self  pantoprazole (PROTONIX) 40 MG tablet [id number] Yes TAKE ONE TABLET BY MOUTH EVERY MORNING [place], Amy S, PA-C Taking Active Self  predniSONE (DELTASONE) 10 MG tablet [id number] Yes TAKE 4 TABLETS (40 MG TOTAL) BY MOUTH DAILY WITH BREAKFAST FOR 3 DAYS. Damita Lack, MD Taking Active   predniSONE  (DELTASONE) 20 MG tablet [id number] Yes Take 2 tablets (40 mg total) by mouth daily with breakfast for 7 days. Aline August, MD Taking Active   Roflumilast (DALIRESP) 250 MCG TABS [id number] Yes Take 250 mcg by mouth daily. Melvenia Needles, NP Taking Active Self           Med Note Michaelle Birks, Kase Shughart A   Thu Dec 27, 2020 10:46 AM) Patient receives through [place] through December 2022.  rosuvastatin (CRESTOR) 20 MG tablet [id number] Yes TAKE ONE TABLET BY MOUTH ONCE DAILY Kennyth Arnold, FNP Taking Active Self          Patient Active Problem List   Diagnosis Date Noted  . Chronic respiratory failure with hypoxia ([place]) 12/07/2020  . Oral candidiasis 12/07/2020  . Acute on chronic respiratory failure with hypoxia ([place]) 10/23/2020  . Cardiac arrest ([place]) 10/04/2020  . Acute kidney injury superimposed on CKD ([place]) 09/23/2020  . Thiamine deficiency 07/02/2020  . Acute on chronic respiratory failure ([place]) 06/20/2020  . Obstructive sleep apnea 05/02/2020  . Hyperlipidemia   .  Chronic combined systolic and diastolic heart failure ([place]) 04/07/2020  . Chronic constipation 07/19/2019  . Snoring 03/29/2019  . Insomnia due to other mental disorder 11/15/2018  . Anticoagulant long-term use 10/21/2018  . Gastroesophageal reflux disease without esophagitis 08/10/2018  . Tobacco abuse 08/10/2018  . Chest pain 07/27/2018  . Essential hypertension 07/06/2018  . Chronic obstructive pulmonary disease ([place]) 07/06/2018  . Alcohol abuse 07/06/2018  . Paroxysmal atrial fibrillation ([place]) 11/28/2017  . Slow transit constipation 06/07/2016  . Chronic anemia 06/06/2016  . Hypertensive kidney disease with chronic kidney disease stage III ([place]) 06/06/2016  . Mixed anxiety and depressive disorder 06/05/2016  . Type 2 diabetes mellitus with diabetic neuropathy, with long-term current use of insulin ([place]) 06/05/2016  . Chronic obstructive pulmonary disease with (acute) exacerbation ([place])  06/05/2016    Immunization History  Administered Date(s) Administered  . Influenza,inj,Quad PF,6+ Mos 06/25/2018, 05/20/2019  . Influenza-Unspecified 06/28/2020  . PFIZER(Purple Top)SARS-COV-2 Vaccination 12/22/2019, 01/16/2020, 10/29/2020  . Pneumococcal Polysaccharide-23 06/08/2016  . Zoster Recombinat (Shingrix) 07/22/2019    Conditions to be addressed/monitored:  Hypertension, Hyperlipidemia, Diabetes, Atrial Fibrillation, COPD, Chronic Kidney Disease, Depression and Anxiety  Care Plan : General Pharmacy (Adult)  Updates made by Germaine Pomfret, RPH since 01/01/2021 12:00 AM    Problem: Hypertension, Hyperlipidemia, Diabetes, Atrial Fibrillation, COPD, Chronic Kidney Disease, Depression and Anxiety   Priority: High    Long-Range Goal: Patient-Specific Goal   Start Date: 12/12/2020  Expected End Date: 06/14/2021  Recent Progress: On track  Priority: High  Note:   Current Barriers:  . Unable to independently afford treatment regimen . Unable to achieve control of diabetes  . Unable to achieve control of heart failure . Unable to achieve control of COPD   Pharmacist Clinical Goal(s):  [name] Patient will verbalize ability to afford treatment regimen . Achieve control of Diabetes as evidenced by A1c less than 8% through collaboration with PharmD and provider. . Achieve control of Heart failure as evidenced by stable weight and absence of hospitalizations  . Achieve control of COPD as evidenced by stable breathing and absence of exacerbations or hospitalizations.   Interventions: . 1:1 collaboration with [name], MD regarding development and update of comprehensive plan of care as evidenced by provider attestation and co-signature . Inter-disciplinary care team collaboration (see longitudinal plan of care) . Comprehensive medication review performed; medication list updated in electronic medical record  Hyperlipidemia: (LDL goal < 70) -Uncontrolled -Current  treatment: . Rosuvastatin 20 mg daily  -Medications previously tried: NA  -Educated on Importance of limiting foods high in cholesterol; -Recommended to continue current medication  -Recommend rechecking fasting lipid panel  Diabetes (A1c goal <8%) -Uncontrolled -Current medications: . Lantus 55 units nightly  -Medications previously tried: Metformin (AKI, HF)   -Current home glucose readings . fasting glucose: 100s . post prandial glucose: NA -Denies hypoglycemic/hyperglycemic symptoms -Current meal patterns: Patient reports he has been snacking more since quitting smoking, and is working on trying to manage that better to help with his weight.  -Current exercise: Mostly inactive, limited by his breathing and heart function.  -Educated on A1c and blood sugar goals; Proper insulin injection technique; Benefits of routine self-monitoring of blood sugar; -Counseled to check feet daily and get yearly eye exams -Recommended to continue current medication  -Recommend rechecking microalbumin/creatinine ratio   Atrial Fibrillation (Goal: prevent stroke and major bleeding) -Controlled -CHADSVASC: 3 -Current treatment: . Rate control: Carvedilol 12.5 mg twice daily  . Anticoagulation: Eliquis 5 mg twice daily  -  Medications previously tried: NA -Counseled on avoidance of NSAIDs due to increased bleeding risk with anticoagulants; -Recommended to continue current medication  Heart Failure (Goal: manage symptoms and prevent exacerbations) -Uncontrolled -Last ejection fraction: 55-60% (Date: 10/04/20) -HF type: Diastolic -NYHA Class: II (slight limitation of activity) -AHA HF Stage: C (Heart disease and symptoms present) -Current treatment: . Amlodipine 5 mg daily  . Carvedilol 12.5 mg twice daily  . Furosemide 40 mg twice daily  . Hydralazine 25 mg three times daily  . Imdur 30 mg daily -Medications previously tried: Lisinopril (swelling), losartan, spironolactone (hyperkalemia)     -Weight today was 200, patient has noticed a gradual increase in weight of 10 pounds over the last month -Educated on Importance of weighing daily; if you gain more than 3 pounds in one day or 5 pounds in one week, contact cardiology office -Recommended to continue current medication  COPD (Goal: control symptoms and prevent exacerbations) -Uncontrolled -Current treatment  . Proventil 0.083% nebulizer  . Trelegy 1 puff daily  . DuoNeb 3 mL every 6 hours as needed  . Daliresp 250 mcg daily (started 12/27/20) . Prednisone 10 mg daily  -Medications previously tried: NA  -Patient reports he has been tolerating Daliresp well, but has not noticed too much change in his breathing. Counseled patient on side effects of Daliresp and need to reach treatment dose before full therapeutic effect is seen.  -Gold Grade: Gold 4 (FEV1<40%) -Current COPD Classification:  D (high sx, >/=2 exacerbations/yr) -MMRC/CAT score: 27 (11/05/20) -Pulmonary function testing: FEV1 25%, FVC 48% (Aug 2021) -Exacerbations requiring treatment in last 6 months: Yes -Patient reports consistent use of maintenance inhaler -Frequency of rescue inhaler use: multiple times daily  -Counseled on Proper inhaler technique; -Recommended increasing Daliresp to 500 mg daily in 4 weeks (01/24/21)  Depression/Anxiety (Goal: Maintain stable mood) -Controlled -Current treatment: . Arirpiprazole 5 mg daily  . Buspirone 15 mg twice daily  . Fluoxetine 20 mg 3 capsules daily  -Medications previously tried/failed: NA -PHQ9: 4 -GAD7: 7 -Educated on Benefits of medication for symptom control Benefits of cognitive-behavioral therapy with or without medication -Recommended to continue current medication  Patient Goals/Self-Care Activities . Patient will:  - check glucose daily before breakfast, document, and provide at future appointments check blood pressure 3-4 times weekly, document, and provide at future appointments weigh daily, and  contact provider if weight gain of 3 pounds in one day; 5 pounds in one week  Follow Up Plan: Telephone follow up appointment with care management team member scheduled for:  02/04/2021 at 9:00 AM      Medication Assistance: Daliresp obtained through [place] medication [place].  Enrollment ends Dec 2022  Patient's preferred pharmacy is:  [facility] - [place], [place] - [address] [place] [address] Phone: [phone] Fax: [phone]  Care Plan and Follow Up Patient Decision:  Patient agrees to Care Plan and Follow-up.  Plan: Telephone follow up appointment with care management team member scheduled for:  02/04/2021 at 9:00 AM  Junius Argyle, PharmD, CPP Clinical Pharmacist [facility]  [phone]

## 2021-01-01 NOTE — Patient Instructions (Signed)
Visit Information It was great speaking with you today!  Please let me know if you have any questions about our visit.  Goals Addressed            This Visit's Progress   . Monitor and Manage My Blood Sugar-Diabetes Type 2   On track    Timeframe:  Long-Range Goal Priority:  High Start Date:   11/28/2020                        Expected End Date:  03/21/2021                     Follow Up Date  01/16/2021     - Continue to check your blood sugar as advised by your doctor  - Continue to take your diabetic medications as prescribed.  - check your blood sugar if you feel it is too high or too low (Report these ongoing symptoms to your doctor) - Continue to enter your blood sugar readings and medication or insulin into daily log - Always take your blood sugar log or meter to your doctor visits  - Plan to eat low carbohydrate and low salt meals, watch your portion sizes and avoid sugar sweetened drinks.  Why is this important?    Checking your blood sugar at home helps to keep it from getting very high or very low.   Writing the results in a diary or log helps the doctor know how to care for you.   Your blood sugar log should have the time, date and the results.   Also, write down the amount of insulin or other medicine that you take.   Other information, like what you ate, exercise done and how you were feeling, will also be helpful.         . Track and Manage Fluids and Swelling-Heart Failure   On track    Timeframe:  Long-Range Goal Priority:  High Start Date:     11/07/2020                       Expected End Date:    02/18/2021                   Follow Up Date  12/05/2020  - call office if I gain more than 3 pounds in one day or 5 pounds in one week - continue to follow a low salt diet.  - /continue to watch for swelling in feet, ankles and legs every day. Report increase in symptoms to your doctor.  - continue to weight yourself daily and record weights.    Why is this  important?    It is important to check your weight daily and watch how much salt and liquids you have.   It will help you to manage your heart failure.    Notes:     . Track and manage my COPD symptoms.   On track    Timeframe:  Long-Range Goal Priority:  High Start Date:       11/07/2020                    Expected End Date:    02/18/2021              Follow up date: 12/19/2020  - eliminate smoking in my home - Be mindful on pollen season which can cause your breathing issues to worsen.  Wear mask when going outside.  - listen for public air quality announcements frequently.  -  Continue to monitor for signs of respiratory infection, including changes in sputum color, volume and thickness, as well as fever. Report these changes to your doctor as soon as possible.  -Take your medications as prescribed. Continue to refill you medication on a timely basis so that you do not miss doses.  - Keep follow up appointments with your doctors - Contact your provider office pharmacist for updates regarding your medication assistance request for Roflumilast.  - Continue to keep yourself familiar with your  COPD action plan  -Continue to use your oxygen as recommended by your provider and monitor oxygen saturation levels with pulse oximetry   Why is this important?   Triggers are activities or things, like tobacco smoke or cold weather, that make your COPD (chronic obstructive pulmonary disease) flare-up.  Knowing these triggers helps you plan how to stay away from them.  When you cannot remove them, you can learn how to manage them.     Notes:          Patient Care Plan: General Pharmacy (Adult)    Problem Identified: Hypertension, Hyperlipidemia, Diabetes, Atrial Fibrillation, COPD, Chronic Kidney Disease, Depression and Anxiety   Priority: High    Long-Range Goal: Patient-Specific Goal   Start Date: 12/12/2020  Expected End Date: 06/14/2021  Recent Progress: On track  Priority: High   Note:   Current Barriers:  . Unable to independently afford treatment regimen . Unable to achieve control of diabetes  . Unable to achieve control of heart failure . Unable to achieve control of COPD   Pharmacist Clinical Goal(s):  [name] Patient will verbalize ability to afford treatment regimen . Achieve control of Diabetes as evidenced by A1c less than 8% through collaboration with PharmD and provider. . Achieve control of Heart failure as evidenced by stable weight and absence of hospitalizations  . Achieve control of COPD as evidenced by stable breathing and absence of exacerbations or hospitalizations.   Interventions: . 1:1 collaboration with [name], MD regarding development and update of comprehensive plan of care as evidenced by provider attestation and co-signature . Inter-disciplinary care team collaboration (see longitudinal plan of care) . Comprehensive medication review performed; medication list updated in electronic medical record  Hyperlipidemia: (LDL goal < 70) -Uncontrolled -Current treatment: . Rosuvastatin 20 mg daily  -Medications previously tried: NA  -Educated on Importance of limiting foods high in cholesterol; -Recommended to continue current medication  -Recommend rechecking fasting lipid panel  Diabetes (A1c goal <8%) -Uncontrolled -Current medications: . Lantus 55 units nightly  -Medications previously tried: Metformin (AKI, HF)   -Current home glucose readings . fasting glucose: 100s . post prandial glucose: NA -Denies hypoglycemic/hyperglycemic symptoms -Current meal patterns: Patient reports he has been snacking more since quitting smoking, and is working on trying to manage that better to help with his weight.  -Current exercise: Mostly inactive, limited by his breathing and heart function.  -Educated on A1c and blood sugar goals; Proper insulin injection technique; Benefits of routine self-monitoring of blood sugar; -Counseled to check  feet daily and get yearly eye exams -Recommended to continue current medication  -Recommend rechecking microalbumin/creatinine ratio   Atrial Fibrillation (Goal: prevent stroke and major bleeding) -Controlled -CHADSVASC: 3 -Current treatment: . Rate control: Carvedilol 12.5 mg twice daily  . Anticoagulation: Eliquis 5 mg twice daily  -Medications previously tried: NA -Counseled on avoidance of NSAIDs due to increased bleeding risk  with anticoagulants; -Recommended to continue current medication  Heart Failure (Goal: manage symptoms and prevent exacerbations) -Uncontrolled -Last ejection fraction: 55-60% (Date: 10/04/20) -HF type: Diastolic -NYHA Class: II (slight limitation of activity) -AHA HF Stage: C (Heart disease and symptoms present) -Current treatment: . Amlodipine 5 mg daily  . Carvedilol 12.5 mg twice daily  . Furosemide 40 mg twice daily  . Hydralazine 25 mg three times daily  . Imdur 30 mg daily -Medications previously tried: Lisinopril (swelling), losartan, spironolactone (hyperkalemia)    -Weight today was 200, patient has noticed a gradual increase in weight of 10 pounds over the last month -Educated on Importance of weighing daily; if you gain more than 3 pounds in one day or 5 pounds in one week, contact cardiology office -Recommended to continue current medication  COPD (Goal: control symptoms and prevent exacerbations) -Uncontrolled -Current treatment  . Proventil 0.083% nebulizer  . Trelegy 1 puff daily  . DuoNeb 3 mL every 6 hours as needed  . Daliresp 250 mcg daily (started 12/27/20) . Prednisone 10 mg daily  -Medications previously tried: NA  -Patient reports he has been tolerating Daliresp well, but has not noticed too much change in his breathing. Counseled patient on side effects of Daliresp and need to reach treatment dose before full therapeutic effect is seen.  -Gold Grade: Gold 4 (FEV1<40%) -Current COPD Classification:  D (high sx, >/=2  exacerbations/yr) -MMRC/CAT score: 27 (11/05/20) -Pulmonary function testing: FEV1 25%, FVC 48% (Aug 2021) -Exacerbations requiring treatment in last 6 months: Yes -Patient reports consistent use of maintenance inhaler -Frequency of rescue inhaler use: multiple times daily  -Counseled on Proper inhaler technique; -Recommended increasing Daliresp to 500 mg daily in 4 weeks (01/24/21)  Depression/Anxiety (Goal: Maintain stable mood) -Controlled -Current treatment: . Arirpiprazole 5 mg daily  . Buspirone 15 mg twice daily  . Fluoxetine 20 mg 3 capsules daily  -Medications previously tried/failed: NA -PHQ9: 4 -GAD7: 7 -Educated on Benefits of medication for symptom control Benefits of cognitive-behavioral therapy with or without medication -Recommended to continue current medication  Patient Goals/Self-Care Activities . Patient will:  - check glucose daily before breakfast, document, and provide at future appointments check blood pressure 3-4 times weekly, document, and provide at future appointments weigh daily, and contact provider if weight gain of 3 pounds in one day; 5 pounds in one week  Follow Up Plan: Telephone follow up appointment with care management team member scheduled for:  02/04/2021 at 9:00 AM    Patient agreed to services and verbal consent obtained.   The patient verbalized understanding of instructions, educational materials, and care plan provided today and declined offer to receive copy of patient instructions, educational materials, and care plan.   Angelena Sole, PharmD, CPP Clinical Pharmacist [facility]  [phone]

## 2021-01-02 ENCOUNTER — Ambulatory Visit: Payer: Medicare Other

## 2021-01-02 DIAGNOSIS — J449 Chronic obstructive pulmonary disease, unspecified: Secondary | ICD-10-CM

## 2021-01-02 DIAGNOSIS — E1140 Type 2 diabetes mellitus with diabetic neuropathy, unspecified: Secondary | ICD-10-CM

## 2021-01-02 DIAGNOSIS — I5042 Chronic combined systolic (congestive) and diastolic (congestive) heart failure: Secondary | ICD-10-CM

## 2021-01-02 DIAGNOSIS — F418 Other specified anxiety disorders: Secondary | ICD-10-CM

## 2021-01-02 DIAGNOSIS — Z794 Long term (current) use of insulin: Secondary | ICD-10-CM

## 2021-01-03 NOTE — Chronic Care Management (AMB) (Signed)
Care Management    RN Visit Note  01/03/2021 Name: Kenneth [name] MRN: [id number] DOB: 10-06-1960 Patient visit 01/02/2021  Subjective: Kenneth [name] is a 60 y.o. year old male who is a primary care patient of [name], [name], MD. The care management team was consulted for assistance with disease management and care coordination needs.    Engaged with patient by telephone for follow up visit in response to provider referral for case management and/or care coordination services.   Consent to Services:   [name] was given information about Care Management services today including:  1. Care Management services includes personalized support from designated clinical staff supervised by his physician, including individualized plan of care and coordination with other care providers 2. 24/7 contact phone numbers for assistance for urgent and routine care needs. 3. The patient may stop case management services at any time by phone call to the office staff.  Patient agreed to services and consent obtained.   Assessment: Review of patient past medical history, allergies, medications, health status, including review of consultants reports, laboratory and other test data, was performed as part of comprehensive evaluation and provision of chronic care management services.   SDOH (Social Determinants of Health) assessments and interventions performed:    Care Plan  Allergies  Allergen Reactions  . Lisinopril Swelling  . Other Other (See Comments)    Lettuce : rash  . Tomato Rash    Outpatient Encounter Medications as of 01/02/2021  Medication Sig Note  . acetaminophen (TYLENOL) 325 MG tablet Take 650 mg by mouth every 6 (six) hours as needed for mild pain or headache.   . albuterol (PROVENTIL) (2.5 MG/3ML) 0.083% nebulizer solution Take 3 mLs (2.5 mg total) by nebulization every 6 (six) hours as needed for shortness of breath.   [name] amLODipine (NORVASC) 5 MG tablet Take 5 mg by mouth every  morning.   . ARIPiprazole (ABILIFY) 5 MG tablet Take 5 mg by mouth daily. 10/29/2020: Prescribed by  Madison Hickman, NP   . busPIRone (BUSPAR) 15 MG tablet Take 1 tablet (15 mg total) by mouth 2 (two) times daily. 10/29/2020: Prescribed by  Madison Hickman, NP    . carvedilol (COREG) 12.5 MG tablet Take 1 tablet (12.5 mg total) by mouth 2 (two) times daily with a meal.   . clotrimazole (MYCELEX) 10 MG troche Take 1 tablet (10 mg total) by mouth 5 (five) times daily.   [name] DM-GG & DM-APAP-CPM (CORICIDIN HBP DAY/NIGHT COLD) 10-20 &15-200-2 MG MISC Take 1 tablet by mouth every 12 (twelve) hours as needed (cold symptoms).   [name] ELIQUIS 5 MG TABS tablet TAKE ONE TABLET BY MOUTH EVERY MORNING and TAKE ONE TABLET BY MOUTH EVERY EVENING   . famotidine (PEPCID) 20 MG tablet Take 20 mg by mouth daily.   [name] FLUoxetine (PROZAC) 20 MG capsule TAKE 3 CAPSULES BY MOUTH  DAILY 10/29/2020: Prescribed by  Madison Hickman, NP   . Fluticasone-Umeclidin-Vilant (TRELEGY ELLIPTA) 100-62.5-25 MCG/INH AEPB Inhale 1 puff into the lungs daily.   . furosemide (LASIX) 40 MG tablet Take 1 tablet (40 mg total) by mouth 2 (two) times daily.   [name] guaiFENesin (MUCINEX) 600 MG 12 hr tablet Take 1 tablet (600 mg total) by mouth 2 (two) times daily.   . hydrALAZINE (APRESOLINE) 25 MG tablet Take 1 tablet (25 mg total) by mouth 3 (three) times daily.   . insulin glargine (LANTUS SOLOSTAR) 100 UNIT/ML Solostar Pen Inject 55 Units into the skin at bedtime. If  fasting blood sugars remain >150 for 1 week, increase insulin dose by 5 units. Maximum of 80 units.   [name] ipratropium-albuterol (DUONEB) 0.5-2.5 (3) MG/3ML SOLN Take 3 mLs by nebulization every 6 (six) hours as needed. (Patient taking differently: Take 3 mLs by nebulization every 6 (six) hours as needed (For shortness of breath).)   . isosorbide mononitrate (IMDUR) 30 MG 24 hr tablet Take 30 mg by mouth daily.   [name] lubiprostone ([name]) 8 MCG capsule Take 1 capsule (8 mcg total) by mouth 2  (two) times daily with a meal.   . Multiple Vitamin (MULTIVITAMIN WITH MINERALS) TABS tablet Take 1 tablet by mouth daily.   . nitroGLYCERIN (NITROSTAT) 0.4 MG SL tablet DISSOLVE 1 TABLET UNDER THE TONGUE EVERY 5 MINUTES AS  NEEDED FOR CHEST PAIN. MAX  OF 3 TABLETS IN 15 MINUTES. CALL 911 IF PAIN PERSISTS. (Patient taking differently: Place 0.4 mg under the tongue every 5 (five) minutes as needed for chest pain.) 10/04/2020: Last filled 07/25/20  . nystatin (MYCOSTATIN) 100000 UNIT/ML suspension Take 5 mLs (500,000 Units total) by mouth 4 (four) times daily.   . pantoprazole (PROTONIX) 40 MG tablet TAKE ONE TABLET BY MOUTH EVERY MORNING   . predniSONE (DELTASONE) 10 MG tablet TAKE 4 TABLETS (40 MG TOTAL) BY MOUTH DAILY WITH BREAKFAST FOR 3 DAYS.   [name] Roflumilast (DALIRESP) 250 MCG TABS Take 250 mcg by mouth daily. 12/27/2020: Patient receives through [place] Patient Assistance through December 2022.  . rosuvastatin (CRESTOR) 20 MG tablet TAKE ONE TABLET BY MOUTH ONCE DAILY   . Blood Glucose Monitoring Suppl (ONE TOUCH ULTRA 2) w/Device KIT Use to test blood sugars 1-2 times daily.   [name] gabapentin (NEURONTIN) 100 MG capsule Take 2 capsules (200 mg total) by mouth 3 (three) times daily. (Patient not taking: Reported on 01/02/2021)   . glucose blood (ONETOUCH ULTRA) test strip USE TO TEST BLOOD SUGAR 1-2 TIMES DAILY   . Insulin Pen Needle (PEN NEEDLES) 31G X 6 MM MISC 1 application by Does not apply route at bedtime.   . Lancets (ONETOUCH ULTRASOFT) lancets USE TO TEST BLOOD SUGAR 1-2 TIMES DAILY    No facility-administered encounter medications on file as of 01/02/2021.    Patient Active Problem List   Diagnosis Date Noted  . Chronic respiratory failure with hypoxia ([place]) 12/07/2020  . Oral candidiasis 12/07/2020  . Acute on chronic respiratory failure with hypoxia ([place]) 10/23/2020  . Cardiac arrest ([place]) 10/04/2020  . Acute kidney injury superimposed on CKD ([place]) 09/23/2020  . Thiamine deficiency  07/02/2020  . Acute on chronic respiratory failure ([place]) 06/20/2020  . Obstructive sleep apnea 05/02/2020  . Hyperlipidemia   . Chronic combined systolic and diastolic heart failure ([place]) 04/07/2020  . Chronic constipation 07/19/2019  . Snoring 03/29/2019  . Insomnia due to other mental disorder 11/15/2018  . Anticoagulant long-term use 10/21/2018  . Gastroesophageal reflux disease without esophagitis 08/10/2018  . Tobacco abuse 08/10/2018  . Chest pain 07/27/2018  . Essential hypertension 07/06/2018  . Chronic obstructive pulmonary disease ([place]) 07/06/2018  . Alcohol abuse 07/06/2018  . Paroxysmal atrial fibrillation ([place]) 11/28/2017  . Slow transit constipation 06/07/2016  . Chronic anemia 06/06/2016  . Hypertensive kidney disease with chronic kidney disease stage III ([place]) 06/06/2016  . Mixed anxiety and depressive disorder 06/05/2016  . Type 2 diabetes mellitus with diabetic neuropathy, with long-term current use of insulin ([place]) 06/05/2016  . Chronic obstructive pulmonary disease with (acute) exacerbation ([place]) 06/05/2016    Conditions to  be addressed/monitored: CHF, COPD, DMII and Anxiety  Care Plan : COPD (Adult)  Updates made by Dannielle Karvonen, RN since 01/03/2021 12:00 AM  Problem: Symptom Exacerbation (COPD)   Priority: High  Long-Range Goal: Symptom Exacerbation Prevented or Minimized   Start Date: 11/07/2020  Expected End Date: 03/21/2021  This Visit's Progress: Not on track  Recent Progress: On track  Priority: High  Current Barriers:  [name] Knowledge deficits related to basic COPD disease process and self care/management:  Patient admitted to the hospital from 12/23/2020 to 4/5/ 2022 due to COPD exacerbation.  Patient discharged to home with ongoing follow up with Palliative care and home O2.  . Case Manager Clinical Goal(s):   patient will be able to verbalize understanding of COPD action plan and when to seek appropriate levels of medical care: Ongoing review of COPD  action plan with patient.    patient will verbalize basic understanding of COPD disease process and self care activities  Interventions:  . Collaboration with [name], MD regarding development and update of comprehensive plan of care as evidenced by provider attestation and co-signature . Inter-disciplinary care team collaboration (see longitudinal plan of care):  Patient states he had a hospital follow up visit with his primary care provider on 12/31/2020.  Patient states his next follow up appointment is scheduled with his primary care provider on 01/30/2021.  Patient states he continues to have biweekly in home nurse follow up with [facility].   Provided patient with verbal COPD education on self care/management/and exacerbation prevention   Provided patient with COPD action plan and reinforced importance of daily self assessment: RNCM reviewed COPD action plan/ zones with patient.  Patient states he feels a little winded when moving around. He states his oxygen is on at 3 L.  Patient states confirms his weight at his doctors office on 12/31/2020 was 197. Patient reports today's weight is 201.2.  Patient denies lower extremity swelling. RNCM reviewed symptom management: weight gain of 3 lbs overnight or 5 lbs in a week. RNCM advised patient to notify his provider of these symptoms.   - medication-adherence assessment completed:  Patient reports his Potassium and Spironolactone medication is on hold until he follows up with his cardiologist in May 2022.   Patient reports he received medication assistance approval for his D[place].  He states he's been taking them medication since 12/27/2020.  - quality of sleep assessed  - self-awareness of symptom triggers encouraged:  Patient states he thinks the pollen caused him to have a flare up with his COPD.  He report being outside quite a bit the day before.  Re-advised patient to stay inside more due to pollen and wear mask when going  outside. Advised patient to check the weather report and pollen index daily prior to going out.   Advised patient to keep follow up appointments with providers:  Reminded patient of 01/18/2021 follow up appointment with pulmonology.  Patient Goals/Self-Care Activities:  - eliminate any smoking in my home - Be mindful on pollen season which can cause your breathing issues to worsen.  Wear mask when going outside.  -  Continue to listen to public air quality announcements frequently.  -  Continue to monitor for signs of respiratory infection, including changes in sputum color, volume and thickness, as well as fever. Report these changes to your doctor as soon as possible.  -Take your medications as prescribed. Continue to refill you medication on a timely basis so that you do not  miss doses.  - Keep follow up appointments with your doctors - Continue to keep yourself familiar with your  COPD action plan / zones.  Call your doctor for increase in ( moderate) COPD symptoms. Call 911 for severe symptoms.  -Continue to use your oxygen as recommended by your provider and monitor oxygen saturation levels with pulse oximetry . - Review education articles for COPD exacerbation and COPD action plan on your after visit summary Follow Up Plan: The patient has been provided with contact information for the care management team and has been advised to call with any health related questions or concerns.  The care management team will reach out to the patient again over the next 30 days.     Problem: Coping Skills (General Plan of Care)   Priority: High  Long-Range Goal: Coping Skills Enhanced   Start Date: 11/07/2020  Expected End Date: 03/21/2021  This Visit's Progress: On track  Recent Progress: On track  Priority: High  Current Barriers:   Knowledge Deficits related to limited coping strategies for managing complex medical conditions:  Ongoing anxiety symptoms.   Clinical Goal(s):  [name] Collaboration with  [name], MD regarding development and update of comprehensive plan of care as evidenced by provider attestation and co-signature . Inter-disciplinary care team collaboration (see longitudinal plan of care)  Patient will work with care management team to establish personal coping strategies.     Interventions:   Evaluation of current treatment plan related to anxiety/ depression and patient's adherence to plan as established by provider.  Collaboration with [name], MD regarding development and update of comprehensive plan of care as evidenced by provider attestation       and co-signature  Inter-disciplinary care team collaboration (see longitudinal plan of care):  Patient reports having telephone visit with Social worker to address anxiety concerns on 12/31/2020.  Patient states he will have biweekly follow up with the social worker.   Discussed plans with patient for ongoing care management follow up and provided patient with direct contact information for care management team  Provided opportunity for patient to express concerns, fears, feelings and expectations regarding his medical conditions/ outcomes.   Patient Goals/Self Care Activities:  . call provider office for new concerns or questions . Continue to work with RN case manager to address care coordination needs and continue to work with counselor  and/ or social worker to address mental health needs   .  laugh; watch a funny movie or comedian .  practice relaxation or meditation daily .  talk about feelings with a friend, family or spiritual advisor .  practice positive thinking and self-talk Follow Up Plan: The patient has been provided with contact information for the care management team and has been advised to call with any health related questions or concerns.  The care management team will reach out to the patient again over the next 30 days.       Care Plan : Heart Failure (Adult)  Updates  made by Dannielle Karvonen, RN since 01/03/2021 12:00 AM  Problem: Disease Progression (Heart Failure)   Priority: High  Onset Date: 11/07/2020  Long-Range Goal: Patient will verbalize understanding of heart failure self management strategies   Start Date: 11/07/2020  Expected End Date: 03/21/2021  This Visit's Progress: On track  Recent Progress: On track  Priority: High  Current Barriers:  [name] Knowledge deficit related to basic heart failure pathophysiology and self care management:  Patient reports feeling "a little  winded when moving around." He confirms his weight at his doctors appointment visit  on 12/31/2020 was 197 lbs  .  Case Manager Clinical Goal(s):  . patient will verbalize understanding of Heart Failure Action Plan and when to call doctor . patient will take all Heart Failure medications as prescribed . patient will weigh daily and record (notifying MD of 3 lb weight gain over night or 5 lb in a week) . Patient will monitor / decrease sodium intake . Patient will contact the RN case manager if he has difficulty obtaining his medications.  . Patient will attend scheduled provider visits.  Interventions:  . Collaboration with [name], MD regarding development and update of comprehensive plan of care as evidenced by provider attestation and co-signature . Inter-disciplinary care team collaboration (see longitudinal plan of care):  Patient states he has biweekly in home nurse visits with [facility].  . Basic overview and discussion of pathophysiology of Heart Failure reviewed  . Provided verbal education on low sodium diet . Reinforced with patient need to follow heart Failure Action Plan and report increase in weight: 3 lbs overnight or 5 lbs in a week, to his doctor. Patient reports he uses his oxygen at 3 liters when he is moving around more.  . Discussed importance of daily weight and advised patient to weigh and record daily . Reviewed role of diuretics in  prevention of fluid overload and management of heart failure . Patient will follow up with his doctor as recommended:  Per chart review, patient has a follow up appointment scheduled with his cardiologist on 12/24/2020.  Patient Goals/Self-Care Activities - call office if I gain more than 3 pounds in one day or 5 pounds in one week - continue to follow a low salt diet.  - continue to watch for swelling in feet, ankles and legs every day. Report increase in symptoms to your doctor. Call 911 for severe heart failure symptoms.  - continue to weight yourself daily and record weights. Follow Up Plan: The patient has been provided with contact information for the care management team and has been advised to call with any health related questions or concerns.  The care management team will reach out to the patient again over the next 2 weeks.       Care Plan : Diabetes Type 2 (Adult)  Updates made by Dannielle Karvonen, RN since 01/03/2021 12:00 AM  Problem: Glycemic Management (Diabetes, Type 2)   Priority: High  Long-Range Goal: Glycemic Management Optimized   Start Date: 11/28/2020  Expected End Date: 03/21/2021  This Visit's Progress: On track  Recent Progress: On track  Priority: High  Objective:  Lab Results  Component Value Date   HGBA1C 6.3 (H) 09/13/2020 .   Lab Results  Component Value Date   CREATININE 1.51 (H) 10/30/2020   CREATININE 1.25 (H) 10/24/2020   CREATININE 1.84 (H) 10/23/2020   Current Barriers:  [name] Knowledge Deficits related to basic Diabetes pathophysiology and self care/management:  Patient reports his blood sugars have ranged in the 300's over the past couple of days. He reports today's fasting blood sugar was 319.  He states he contributes this to being on the prednisone due to his recent hospitalization.  Patient reports he took his last dose of prednisone this morning.  . Case Manager Clinical Goal(s):  . patient will demonstrate improved adherence to prescribed  treatment plan for diabetes self care/management as evidenced by: daily monitoring and recording of CBG  adherence  to ADA/ carb modified diet adherence to prescribed medication regimen contacting provider for new or worsened symptoms or questions Interventions:  . Collaboration with [name], MD regarding development and update of comprehensive plan of care as evidenced by provider attestation and co-signature . Inter-disciplinary care team collaboration (see longitudinal plan of care):   . Reviewed medications with patient and discussed importance of medication adherence:   . Discussed plans with patient for ongoing care management follow up and provided patient with direct contact information for care management team:  Prior to his recent admission his blood sugars were in the 100's with an occasional blood sugar <70.  RNCM discussed and provided written hypoglycemic management education.  . Reviewed scheduled/upcoming provider appointments including:  Patient states he has a follow up appointment with his primary care provider on 01/30/2021. Self-Care Activities Self administers oral medications as prescribed Self administers insulin as prescribed Attends all scheduled provider appointments Checks blood sugars as prescribed and utilize hyper and hypoglycemia protocol as needed Adheres to prescribed ADA/carb modified Patient Goals: - Continue to check your blood sugar as advised by your doctor  - Continue to take your diabetic medications as prescribed.  - check your blood sugar if you feel it is too high or too low (Report these ongoing symptoms to your doctor) - Continue to enter your blood sugar readings and medication or insulin into daily log - Always take your blood sugar log or meter to your doctor visits  - Plan to eat low carbohydrate and low salt meals, watch your portion sizes and avoid sugar sweetened drinks.  - Review education article on Hypoglycemia located on your  after visit summary. Follow Up Plan: The patient has been provided with contact information for the care management team and has been advised to call with any health related questions or concerns.  The care management team will reach out to the patient again over the next 2 weeks.        Plan: The patient has been provided with contact information for the care management team and has been advised to call with any health related questions or concerns.  and The care management team will reach out to the patient again over the next 45 days.  Quinn Plowman RN,BSN,CCM RN Case Manager [place] [phone]

## 2021-01-03 NOTE — Patient Instructions (Addendum)
Visit Information: Thank you for taking the time to speak with me today.   Goals Addressed            This Visit's Progress   . Monitor and Manage My Blood Sugar-Diabetes Type 2   On track    Timeframe:  Long-Range Goal Priority:  High Start Date:   11/28/2020                        Expected End Date:  03/21/2021                     Follow Up Date  01/09/2021   - Continue to check your blood sugar as advised by your doctor  - Continue to take your diabetic medications as prescribed.  - check your blood sugar if you feel it is too high or too low (Report these ongoing symptoms to your doctor) - Continue to enter your blood sugar readings and medication or insulin into daily log - Always take your blood sugar log or meter to your doctor visits  - Plan to eat low carbohydrate and low salt meals, watch your portion sizes and avoid sugar sweetened drinks.  - Review education article on Hypoglycemia located on your after visit summary.  Why is this important?    Checking your blood sugar at home helps to keep it from getting very high or very low.   Writing the results in a diary or log helps the doctor know how to care for you.   Your blood sugar log should have the time, date and the results.   Also, write down the amount of insulin or other medicine that you take.   Other information, like what you ate, exercise done and how you were feeling, will also be helpful.         . Patient will verbalize decrease in anxiety   On track    Timeframe:  Long-Range Goal Priority:  High Start Date:   11/07/2020                          Expected End Date:  03/21/2021                     Follow Up Date 01/09/2021    . call provider office for new concerns or questions . Continue to work with RN case manager to address care coordination needs and continue to work with counselor  and/ or social worker to address mental health needs   .  laugh; watch a funny movie or comedian .  practice  relaxation or meditation daily .  talk about feelings with a friend, family or spiritual advisor .  practice positive thinking and self-talk   Why is this important?    When you are stressed, down or upset, your body reacts too.   For example, your blood pressure may get higher; you may have a headache or stomachache.   When your emotions get the best of you, your body's ability to fight off cold and flu gets weak.   These steps will help you manage your emotions.         . Track and Manage Fluids and Swelling-Heart Failure   On track    Timeframe:  Long-Range Goal Priority:  High Start Date:     11/07/2020  Expected End Date:    03/21/2021                   Follow Up Date  01/09/2021  - call office if I gain more than 3 pounds in one day or 5 pounds in one week - continue to follow a low salt diet.  - continue to watch for swelling in feet, ankles and legs every day. Report increase in symptoms to your doctor. Call 911 for severe heart failure symptoms.  - continue to weight yourself daily and record weights.    Why is this important?    It is important to check your weight daily and watch how much salt and liquids you have.   It will help you to manage your heart failure.        . Track and manage my COPD symptoms.   On track    Timeframe:  Long-Range Goal Priority:  High Start Date:       11/07/2020                    Expected End Date:    03/21/2021              Follow up date: 01/09/2021  - eliminate any smoking in my home - Be mindful on pollen season which can cause your breathing issues to worsen.  Wear mask when going outside.  -  Continue to listen to public air quality announcements frequently.  -  Continue to monitor for signs of respiratory infection, including changes in sputum color, volume and thickness, as well as fever. Report these changes to your doctor as soon as possible.  -Take your medications as prescribed. Continue to refill  you medication on a timely basis so that you do not miss doses.  - Keep follow up appointments with your doctors - Continue to keep yourself familiar with your  COPD action plan / zones.  Call your doctor for increase in ( moderate) COPD symptoms. Call 911 for severe symptoms.  -Continue to use your oxygen as recommended by your provider and monitor oxygen saturation levels with pulse oximetry. - Review education articles for COPD exacerbation and COPD action plan on your after visit summary  Why is this important?   Triggers are activities or things, like tobacco smoke or cold weather, that make your COPD (chronic obstructive pulmonary disease) flare-up.  Knowing these triggers helps you plan how to stay away from them.  When you cannot remove them, you can learn how to manage them.              Hypoglycemia Hypoglycemia is when the sugar (glucose) level in your blood is too low. Low blood sugar can happen to people who have diabetes and people who do not have diabetes. Low blood sugar can happen quickly, and it can be an emergency. What are the causes? This condition happens most often in people who have diabetes and may be caused by:  Diabetes medicine.  Not eating enough, or not eating often enough.  Doing more physical activity.  Drinking alcohol on an empty stomach. If you do not have diabetes, hypoglycemia may be caused by:  A tumor in the pancreas.  Not eating enough, or not eating for long periods at a time (fasting).  A very bad infection or illness.  Problems after having weight loss (bariatric) surgery.  Kidney failure or liver failure.  Certain medicines. What increases the risk? This condition is more likely  to develop in people who:  Have diabetes and take medicines to lower their blood sugar.  Abuse alcohol.  Have a very bad illness. What are the signs or symptoms? Symptoms depend on whether your low blood sugar is mild, moderate, or very  low. Mild  Hunger.  Feeling worried or nervous (anxious).  Sweating and feeling clammy.  Feeling dizzy or light-headed.  Being sleepy or having trouble sleeping.  Feeling like you may vomit (nauseous).  A fast heartbeat.  A headache.  Blurry vision.  Being irritable or grouchy.  Tingling or loss of feeling (numbness) around your mouth, lips, or tongue.  Trouble with moving (coordination). Moderate  Confusion and poor judgment.  Behavior changes.  Weakness.  Uneven heartbeats. Very low Very low blood sugar (severe hypoglycemia) is a medical emergency. It can cause:  Fainting.  Jerky movements that you cannot control (seizure).  Loss of consciousness (coma).  Death. How is this treated? Treating low blood sugar Low blood sugar is often treated by eating or drinking something sugary right away. The snack should contain 15 grams of a fast-acting carb (carbohydrate). Options include:  4 oz (120 mL) of fruit juice.  4-6 oz (120-150 mL) of regular soda (not diet soda).  8 oz (240 mL) of low-fat milk.  Several pieces of hard candy. Check food labels to find out how many to eat for 15 grams.  1 Tbsp (15 mL) of sugar or honey. Treating low blood sugar if you have diabetes If you can think clearly and swallow safely, follow the 15:15 rule:  Take 15 grams of a fast-acting carb. Talk with your doctor about how much you should take.  Always keep a source of fast-acting carb with you, such as: ? Sugar tablets (glucose pills). Take 4 pills. ? Several pieces of hard candy. Check food labels to see how many pieces to eat for 15 grams. ? 4 oz (120 mL) of fruit juice. ? 4-6 oz (120-150 mL) of regular (not diet) soda. ? 1 Tbsp (15 mL) of honey or sugar.  Check your blood sugar 15 minutes after you take the carb.  If your blood sugar is still at or below 70 mg/dL (3.9 mmol/L), take 15 grams of a carb again.  If your blood sugar does not go above 70 mg/dL (3.9  mmol/L) after 3 tries, get help right away.  After your blood sugar goes back to normal, eat a meal or a snack within 1 hour.   Treating very low blood sugar If your blood sugar is at or below 54 mg/dL (3 mmol/L), you have very low blood sugar, or severe hypoglycemia. This is an emergency. Get medical help right away. If you have very low blood sugar and you cannot eat or drink, you will need to be given a hormone called glucagon. A family member or friend should learn how to check your blood sugar and how to give you glucagon. Ask your doctor if you need to have an emergency glucagon kit at home. Very low blood sugar may also need to be treated in a hospital. Follow these instructions at home: General instructions  Take over-the-counter and prescription medicines only as told by your doctor.  Stay aware of your blood sugar as told by your doctor.  If you drink alcohol: ? Limit how much you use to:  0-1 drink a day for nonpregnant women.  0-2 drinks a day for men. ? Be aware of how much alcohol is in your drink. In  the [place] one drink equals one 12 oz bottle of beer (355 mL), one 5 oz glass of wine (148 mL), or one 1 oz glass of hard liquor (44 mL).  Keep all follow-up visits as told by your doctor. This is important. If you have diabetes:  Always have a rapid-acting carb (15 grams) option with you to treat low blood sugar.  Follow your diabetes care plan as told by your doctor. Make sure you: ? Know the symptoms of low blood sugar. ? Check your blood sugar as often as told by your doctor. Always check it before and after exercise. ? Always check your blood sugar before you drive. ? Take your medicines as told. ? Follow your meal plan. ? Eat on time. Do not skip meals.  Share your diabetes care plan with: ? Your work or school. ? People you live with.  Carry a card or wear jewelry that says you have diabetes.   Contact a doctor if:  You have trouble keeping your blood sugar  in your target range.  You have low blood sugar often. Get help right away if:  You still have symptoms after you eat or drink something that contains 15 grams of fast-acting carb and you cannot get your blood sugar above 70 mg/dL by following the 15:15 rule.  Your blood sugar is at or below 54 mg/dL (3 mmol/L).  You have a seizure.  You faint. These symptoms may be an emergency. Do not wait to see if the symptoms will go away. Get medical help right away. Call your local emergency services (911 in the [place] Do not drive yourself to the hospital. Summary  Hypoglycemia happens when the level of sugar (glucose) in your blood is too low.  Low blood sugar can happen to people who have diabetes and people who do not have diabetes. Low blood sugar can happen quickly, and it can be an emergency.  Make sure you know the symptoms of low blood sugar and know how to treat it.  Always keep a source of sugar (fast-acting carb) with you to treat low blood sugar. This information is not intended to replace advice given to you by your health care provider. Make sure you discuss any questions you have with your health care provider. Document Revised: 08/03/2019 Document Reviewed: 08/03/2019 Elsevier Patient Education  2021 [place].  Chronic Obstructive Pulmonary Disease Exacerbation Chronic obstructive pulmonary disease (COPD) is a long-term (chronic) lung problem. In COPD, the flow of air from the lungs is limited. COPD exacerbations are times that breathing gets worse and you need more than your normal treatment. Without treatment, they can be life threatening. If they happen often, your lungs can become more damaged. If your COPD gets worse, your doctor may treat you with:  Medicines.  Oxygen.  Different ways to clear your airway, such as using a mask. Follow these instructions at home: Medicines  Take over-the-counter and prescription medicines only as told by your doctor.  If you  take an antibiotic or steroid medicine, do not stop taking the medicine even if you start to feel better.  Keep up with shots (vaccinations) as told by your doctor. Be sure to get a yearly (annual) flu shot. Lifestyle  Do not smoke. If you need help quitting, ask your doctor.  Eat healthy foods.  Exercise regularly.  Get plenty of sleep.  Avoid tobacco smoke and other things that can bother your lungs.  Wash your hands often with soap and water.  This will help keep you from getting an infection. If you cannot use soap and water, use hand sanitizer.  During flu season, avoid areas that are crowded with people. General instructions  Drink enough fluid to keep your pee (urine) clear or pale yellow. Do not do this if your doctor has told you not to.  Use a cool mist machine (vaporizer).  If you use oxygen or a machine that turns medicine into a mist (nebulizer), continue to use it as told.  Follow all instructions for rehabilitation. These are steps you can take to make your body work better.  Keep all follow-up visits as told by your doctor. This is important. Contact a doctor if:  Your COPD symptoms get worse than normal. Get help right away if:  You are short of breath and it gets worse.  You have trouble talking.  You have chest pain.  You cough up blood.  You have a fever.  You keep throwing up (vomiting).  You feel weak or you pass out (faint).  You feel confused.  You are not able to sleep because of your symptoms.  You are not able to do daily activities. Summary  COPD exacerbations are times that breathing gets worse and you need more treatment than normal.  COPD exacerbations can be very serious and may cause your lungs to become more damaged.  Do not smoke. If you need help quitting, ask your doctor.  Stay up-to-date on your shots. Get a flu shot every year. This information is not intended to replace advice given to you by your health care  provider. Make sure you discuss any questions you have with your health care provider. Document Revised: 08/21/2017 Document Reviewed: 10/13/2016 Elsevier Patient Education  2021 [place].  COPD Action Plan A COPD action plan is a description of what to do when you have a flare (exacerbation) of chronic obstructive pulmonary disease (COPD). Your action plan is a color-coded plan that lists the symptoms that indicate whether your condition is under control and what actions to take.  If you have symptoms in the [place] zone, it means you are doing well that day.  If you have symptoms in the yellow zone, it means you are having a bad day or an exacerbation.  If you have symptoms in the red zone, you need urgent medical care. Follow the plan that you and your health care provider developed. Review your plan with your health care provider at each visit. Red zone Symptoms in this zone mean that you should get medical help right away. They include:  Feeling very short of breath, even when you are resting.  Not being able to do any activities because of poor breathing.  Not being able to sleep because of poor breathing.  Fever or shaking chills.  Feeling confused or very sleepy.  Chest pain.  Coughing up blood. If you have any of these symptoms, call emergency services (911 in the [place] or go to the nearest emergency room.   Yellow zone Symptoms in this zone mean that your condition may be getting worse. They include:  Feeling more short of breath than usual.  Having less energy for daily activities than usual.  Phlegm or mucus that is thicker than usual.  Needing to use your rescue inhaler or nebulizer more often than usual.  More ankle swelling than usual.  Coughing more than usual.  Feeling like you have a chest cold.  Trouble sleeping due to COPD symptoms.  Decreased  appetite.  COPD medicines not helping as much as usual. If you experience any "yellow"  symptoms:  Keep taking your daily medicines as directed.  Use your quick-relief inhaler as told by your health care provider.  If you were prescribed steroid medicine to take by mouth (oral medicine), start taking it as told by your health care provider.  If you were prescribed an antibiotic medicine, start taking it as told by your health care provider. Do not stop taking the antibiotic even if you start to feel better.  Use oxygen as told by your health care provider.  Get more rest.  Do your pursed-lip breathing exercises.  Do not smoke. Avoid any irritants in the air. If your signs and symptoms do not improve after taking these steps, call your health care provider right away.   [place] zone Symptoms in this zone mean that you are doing well. They include:  Being able to do your usual activities and exercise.  Having the usual amount of coughing, including the same amount of phlegm or mucus.  Being able to sleep well.  Having a good appetite.   Where to find more information: You can find more information about COPD from:  American Lung Association, My COPD Action Plan: www.lung.org  COPD Foundation: www.copdfoundation.[place]: https://wilson-e[place].com/ Follow these instructions at home:  Continue taking your daily medicines as told by your health care provider.  Make sure you receive all the immunizations that your health care provider recommends, especially the pneumococcal and influenza vaccines.  Wash your hands often with soap and water. Have family members wash their hands too. Regular hand washing can help prevent infections.  Follow your usual exercise and diet plan.  Avoid irritants in the air, such as smoke.  Do not use any products that contain nicotine or tobacco. These products include cigarettes, chewing tobacco, and vaping devices, such as e-cigarettes. If you need help quitting, ask your health care provider. Summary  A  COPD action plan tells you what to do when you have a flare (exacerbation) of chronic obstructive pulmonary disease (COPD).  Follow each action plan for your symptoms. If you have any symptoms in the red zone, call emergency services (911 in the [place] or go to the nearest emergency room. This information is not intended to replace advice given to you by your health care provider. Make sure you discuss any questions you have with your health care provider. Document Revised: 07/17/2020 Document Reviewed: 07/17/2020 Elsevier Patient Education  2021 [place].  Patient verbalizes understanding of instructions provided today and agrees to view in [place].   The patient has been provided with contact information for the care management team and has been advised to call with any health related questions or concerns.  The care management team will reach out to the patient again over the next 45 days.   Quinn Plowman RN,BSN,CCM RN Case Manager [place] [phone]

## 2021-01-09 ENCOUNTER — Telehealth: Payer: Self-pay

## 2021-01-09 ENCOUNTER — Ambulatory Visit: Payer: Medicare Other | Admitting: Family Medicine

## 2021-01-09 ENCOUNTER — Telehealth: Payer: Medicare Other

## 2021-01-09 NOTE — Telephone Encounter (Signed)
  Chronic Care Management   Outreach Note  01/09/2021 Name: Kenneth [name] MRN: [id number] DOB: [birth year]  Referred by: [name], MD Reason for referral : Care Coordination (COPD, HF, Anxiety, DMII)   An unsuccessful telephone outreach was attempted today. The patient was referred to the case management team for assistance with care management and care coordination.   Follow Up Plan: A HIPAA compliant phone message was left for the patient providing contact information and requesting a return call.   George Ina RN,BSN,CCM RN Case Manager [place] [place]  [phone]

## 2021-01-14 ENCOUNTER — Telehealth: Payer: Self-pay | Admitting: Family Medicine

## 2021-01-14 ENCOUNTER — Telehealth: Payer: Self-pay

## 2021-01-14 NOTE — Progress Notes (Signed)
Patient asking if he is cleared to go to the dentsit.Notfied Clinical Pharmacist.  Per Clinical Pharmacist, Patient needs to reach out to his PCP to ask to get cleared.  Patient Verbalized understanding.  Everlean Cherry Clinical Pharmacist Assistant [phone]

## 2021-01-14 NOTE — Telephone Encounter (Signed)
Patient is calling to see if it is okay to go ahead and schedule his dentist appointment. He states that Dr. Veto Kemps told him to hold off when he was last seen. Please give him a call back at [phone].

## 2021-01-14 NOTE — Telephone Encounter (Signed)
Please advise on message below. Thanks. Dm/cma  

## 2021-01-15 ENCOUNTER — Ambulatory Visit (INDEPENDENT_AMBULATORY_CARE_PROVIDER_SITE_OTHER): Payer: Medicare Other | Admitting: Podiatry

## 2021-01-15 ENCOUNTER — Encounter: Payer: Self-pay | Admitting: Podiatry

## 2021-01-15 ENCOUNTER — Other Ambulatory Visit: Payer: Self-pay

## 2021-01-15 ENCOUNTER — Encounter: Payer: Self-pay | Admitting: Family Medicine

## 2021-01-15 DIAGNOSIS — B353 Tinea pedis: Secondary | ICD-10-CM | POA: Diagnosis not present

## 2021-01-15 DIAGNOSIS — M2141 Flat foot [pes planus] (acquired), right foot: Secondary | ICD-10-CM

## 2021-01-15 DIAGNOSIS — Z794 Long term (current) use of insulin: Secondary | ICD-10-CM

## 2021-01-15 DIAGNOSIS — B351 Tinea unguium: Secondary | ICD-10-CM

## 2021-01-15 DIAGNOSIS — M2140 Flat foot [pes planus] (acquired), unspecified foot: Secondary | ICD-10-CM | POA: Insufficient documentation

## 2021-01-15 DIAGNOSIS — M79674 Pain in right toe(s): Secondary | ICD-10-CM

## 2021-01-15 DIAGNOSIS — M2142 Flat foot [pes planus] (acquired), left foot: Secondary | ICD-10-CM | POA: Diagnosis not present

## 2021-01-15 DIAGNOSIS — E1140 Type 2 diabetes mellitus with diabetic neuropathy, unspecified: Secondary | ICD-10-CM

## 2021-01-15 DIAGNOSIS — M79675 Pain in left toe(s): Secondary | ICD-10-CM

## 2021-01-15 MED ORDER — KETOCONAZOLE 2 % EX CREA
1.0000 | TOPICAL_CREAM | Freq: Every day | CUTANEOUS | 2 refills | Status: DC
Start: 2021-01-15 — End: 2021-04-06

## 2021-01-15 NOTE — Progress Notes (Signed)
  Subjective:  Patient ID: Kenneth [name], male    DOB: [birth year],  MRN: [id number]  Chief Complaint  Patient presents with  . Diabetes    Foot exam, A1C  14.4. Blood sugar runs around 100 in the morning, and 300 in the evening  . Nail Problem    Thick painful toenails that he cannot cut himself  . Tinea Pedis    60 y.o. male presents with the above complaint. History confirmed with patient.   Objective:  Physical Exam: warm, good capillary refill, no trophic changes or ulcerative lesions and normal DP and PT pulses.  Onychomycosis x10 with thickened elongated nail plates with brown discoloration and subungual debris and dystrophy.  He has tinea pedis in moccasin distribution with dry scaling skin rash.  Loss of protective sensation bilateral foot Assessment:   1. Tinea pedis of both feet   2. Pes planus of both feet   3. Type 2 diabetes mellitus with diabetic neuropathy, with long-term current use of insulin (HCC)      Plan:  Patient was evaluated and treated and all questions answered.  Patient educated on diabetes. Discussed proper diabetic foot care and discussed risks and complications of disease. Educated patient in depth on reasons to return to the office immediately should he/she discover anything concerning or new on the feet. All questions answered. Discussed proper shoes as well.   Discussed the etiology and treatment options for the condition in detail with the patient. Educated patient on the topical and oral treatment options for mycotic nails. Recommended debridement of the nails today. Sharp and mechanical debridement performed of all painful and mycotic nails today. Nails debrided in length and thickness using a nail nipper to level of comfort. Discussed treatment options including appropriate shoe gear. Follow up as needed for painful nails.  Discussed the etiology and treatment options for tinea pedis.  Discussed topical and oral treatment.  Recommended topical  treatment with 2% ketoconazole cream.  This was sent to the patient's pharmacy.  Also discussed appropriate foot hygiene, use of antifungal spray such as Tinactin in shoes, as well as cleaning her foot surfaces such as showers and bathroom floors with bleach.   Return in about 3 months (around 04/16/2021) for diabetic nail trim.

## 2021-01-15 NOTE — Telephone Encounter (Signed)
Spoke to patient and advise of providers recommendations.. he will contact his cardiologist to check on medication.  The procedure is to have 4-5 teeth removed.  Dm/cma

## 2021-01-15 NOTE — Telephone Encounter (Signed)
lft VM to rtn call. Dm/cma  

## 2021-01-16 ENCOUNTER — Ambulatory Visit: Payer: Medicare Other

## 2021-01-16 DIAGNOSIS — I5042 Chronic combined systolic (congestive) and diastolic (congestive) heart failure: Secondary | ICD-10-CM | POA: Diagnosis not present

## 2021-01-16 DIAGNOSIS — E1140 Type 2 diabetes mellitus with diabetic neuropathy, unspecified: Secondary | ICD-10-CM | POA: Diagnosis not present

## 2021-01-16 DIAGNOSIS — I480 Paroxysmal atrial fibrillation: Secondary | ICD-10-CM | POA: Diagnosis not present

## 2021-01-16 DIAGNOSIS — J439 Emphysema, unspecified: Secondary | ICD-10-CM | POA: Diagnosis not present

## 2021-01-16 DIAGNOSIS — F418 Other specified anxiety disorders: Secondary | ICD-10-CM | POA: Diagnosis not present

## 2021-01-16 DIAGNOSIS — I152 Hypertension secondary to endocrine disorders: Secondary | ICD-10-CM | POA: Diagnosis not present

## 2021-01-16 DIAGNOSIS — J449 Chronic obstructive pulmonary disease, unspecified: Secondary | ICD-10-CM

## 2021-01-16 DIAGNOSIS — E785 Hyperlipidemia, unspecified: Secondary | ICD-10-CM | POA: Diagnosis not present

## 2021-01-16 DIAGNOSIS — Z794 Long term (current) use of insulin: Secondary | ICD-10-CM | POA: Diagnosis not present

## 2021-01-16 DIAGNOSIS — E1169 Type 2 diabetes mellitus with other specified complication: Secondary | ICD-10-CM | POA: Diagnosis not present

## 2021-01-16 DIAGNOSIS — E1159 Type 2 diabetes mellitus with other circulatory complications: Secondary | ICD-10-CM | POA: Diagnosis not present

## 2021-01-16 DIAGNOSIS — I5031 Acute diastolic (congestive) heart failure: Secondary | ICD-10-CM | POA: Diagnosis not present

## 2021-01-16 NOTE — Patient Instructions (Signed)
Visit Information: Thank you for taking the time to speak with me today.   Goals Addressed            This Visit's Progress   . Monitor and Manage My Blood Sugar-Diabetes Type 2       Timeframe:  Long-Range Goal Priority:  High Start Date:   11/28/2020                        Expected End Date:  03/21/2021                     Follow Up Date  02/08/2021   - Continue to check your blood sugar as advised by your doctor  - Continue to take your diabetic medications as prescribed.  - check your blood sugar if you feel it is too high or too low (Report these ongoing symptoms to your doctor) - Continue to enter your blood sugar readings and medication or insulin into daily log - Always take your blood sugar log or meter to your doctor visits  - Plan to eat low carbohydrate and low salt meals, watch your portion sizes and avoid sugar sweetened drinks.  - Review education article on Hypoglycemia located on your after visit summary.  Why is this important?    Checking your blood sugar at home helps to keep it from getting very high or very low.   Writing the results in a diary or log helps the doctor know how to care for you.   Your blood sugar log should have the time, date and the results.   Also, write down the amount of insulin or other medicine that you take.   Other information, like what you ate, exercise done and how you were feeling, will also be helpful.         . Patient will verbalize decrease in anxiety   On track    Timeframe:  Long-Range Goal Priority:  High Start Date:   11/07/2020                          Expected End Date:  03/21/2021                     Follow Up Date 02/08/2021    . call provider office for new concerns or questions . Continue to work with RN case manager to address care coordination needs and continue to work with counselor  and/ or social worker to address mental health needs   .  laugh; watch a funny movie or comedian .  practice relaxation or  meditation daily .  talk about feelings with a friend, family or spiritual advisor .  practice positive thinking and self-talk   Why is this important?    When you are stressed, down or upset, your body reacts too.   For example, your blood pressure may get higher; you may have a headache or stomachache.   When your emotions get the best of you, your body's ability to fight off cold and flu gets weak.   These steps will help you manage your emotions.         . Track and Manage Fluids and Swelling-Heart Failure   On track    Timeframe:  Long-Range Goal Priority:  High Start Date:     11/07/2020  Expected End Date:    03/21/2021                   Follow Up Date  02/08/2021  - Reinforced with patient to call office if I gain more than 3 pounds in one day or 5 pounds in one week - continue to follow a low salt diet.  - continue to watch for swelling in feet, ankles and legs every day. Report increase in symptoms to your doctor. Call 911 for severe heart failure symptoms.  - continue to weight yourself daily and record weights. - Continue to take your medications as prescribed and refill them timely.     Why is this important?    It is important to check your weight daily and watch how much salt and liquids you have.   It will help you to manage your heart failure.        . Track and manage my COPD symptoms.   On track    Timeframe:  Long-Range Goal Priority:  High Start Date:       11/07/2020                    Expected End Date:    03/21/2021              Follow up date: 02/08/2021  - eliminate any smoking in my home - Be mindful on pollen season which can cause your breathing issues to worsen.  Wear mask when going outside.  -  Continue to listen to public air quality announcements frequently.  -  Continue to monitor for signs of respiratory infection, including changes in sputum color, volume and thickness, as well as fever. Report these changes to  your doctor as soon as possible.  -Take your medications as prescribed.  - Keep follow up appointments with your doctors ( Reminder follow up with pulmonary doctor on 01/18/2021) - Continue to keep yourself familiar with your  COPD action plan / zones.  Call your doctor for increase in ( moderate) COPD symptoms. Call 911 for severe symptoms.  -Continue to use your oxygen as recommended by your provider and monitor oxygen saturation levels with pulse oximetry .  Why is this important?   Triggers are activities or things, like tobacco smoke or cold weather, that make your COPD (chronic obstructive pulmonary disease) flare-up.  Knowing these triggers helps you plan how to stay away from them.  When you cannot remove them, you can learn how to manage them.              Patient verbalizes understanding of instructions provided today and agrees to view in MyChart.   The patient has been provided with contact information for the care management team and has been advised to call with any health related questions or concerns.  The care management team will reach out to the patient again over the next 30 days.   George Ina RN,BSN,CCM RN Case Manager [place] [place]  [phone]

## 2021-01-16 NOTE — Chronic Care Management (AMB) (Signed)
Care Management    RN Visit Note  01/16/2021 Name: Kenneth [name] MRN: [id number] DOB: [birth year]  Subjective: Kenneth [name] is a 60 y.o. year old male who is a primary care patient of [name], [name], MD. The care management team was consulted for assistance with disease management and care coordination needs.    Engaged with patient by telephone for follow up visit in response to provider referral for case management and/or care coordination services.   Consent to Services:   Kenneth [name] was given information about Care Management services today including:  1. Care Management services includes personalized support from designated clinical staff supervised by his physician, including individualized plan of care and coordination with other care providers 2. 24/7 contact phone numbers for assistance for urgent and routine care needs. 3. The patient may stop case management services at any time by phone call to the office staff.  Patient agreed to services and consent obtained.   Assessment: Review of patient past medical history, allergies, medications, health status, including review of consultants reports, laboratory and other test data, was performed as part of comprehensive evaluation and provision of chronic care management services.   SDOH (Social Determinants of Health) assessments and interventions performed:    Care Plan  Allergies  Allergen Reactions  . Lisinopril Swelling  . Other Other (See Comments)    Lettuce : rash  . Tomato Rash    Outpatient Encounter Medications as of 01/16/2021  Medication Sig Note  . acetaminophen (TYLENOL) 325 MG tablet Take 650 mg by mouth every 6 (six) hours as needed for mild pain or headache.   . albuterol (PROVENTIL) (2.5 MG/3ML) 0.083% nebulizer solution Take 3 mLs (2.5 mg total) by nebulization every 6 (six) hours as needed for shortness of breath.   [name] amLODipine (NORVASC) 5 MG tablet Take 5 mg by mouth every morning.   . ARIPiprazole  (ABILIFY) 5 MG tablet Take 5 mg by mouth daily. 10/29/2020: Prescribed by  Madison Hickman, NP   . Blood Glucose Monitoring Suppl (ONE TOUCH ULTRA 2) w/Device KIT Use to test blood sugars 1-2 times daily.   . busPIRone (BUSPAR) 15 MG tablet Take 1 tablet (15 mg total) by mouth 2 (two) times daily. 10/29/2020: Prescribed by  Madison Hickman, NP    . carvedilol (COREG) 12.5 MG tablet Take 1 tablet (12.5 mg total) by mouth 2 (two) times daily with a meal.   . clotrimazole (MYCELEX) 10 MG troche Take 1 tablet (10 mg total) by mouth 5 (five) times daily.   [name] DM-GG & DM-APAP-CPM (CORICIDIN HBP DAY/NIGHT COLD) 10-20 &15-200-2 MG MISC Take 1 tablet by mouth every 12 (twelve) hours as needed (cold symptoms).   [name] ELIQUIS 5 MG TABS tablet TAKE ONE TABLET BY MOUTH EVERY MORNING and TAKE ONE TABLET BY MOUTH EVERY EVENING   . famotidine (PEPCID) 20 MG tablet Take 20 mg by mouth daily.   [name] FLUoxetine (PROZAC) 20 MG capsule TAKE 3 CAPSULES BY MOUTH  DAILY 10/29/2020: Prescribed by  Madison Hickman, NP   . Fluticasone-Umeclidin-Vilant (TRELEGY ELLIPTA) 100-62.5-25 MCG/INH AEPB Inhale 1 puff into the lungs daily.   . furosemide (LASIX) 40 MG tablet Take 1 tablet (40 mg total) by mouth 2 (two) times daily.   [name] gabapentin (NEURONTIN) 100 MG capsule Take 2 capsules (200 mg total) by mouth 3 (three) times daily. (Patient not taking: Reported on 01/02/2021)   . glucose blood (ONETOUCH ULTRA) test strip USE TO TEST BLOOD SUGAR 1-2 TIMES DAILY   .  guaiFENesin (MUCINEX) 600 MG 12 hr tablet Take 1 tablet (600 mg total) by mouth 2 (two) times daily.   . hydrALAZINE (APRESOLINE) 25 MG tablet Take 1 tablet (25 mg total) by mouth 3 (three) times daily.   . insulin glargine (LANTUS SOLOSTAR) 100 UNIT/ML Solostar Pen Inject 55 Units into the skin at bedtime. If fasting blood sugars remain >150 for 1 week, increase insulin dose by 5 units. Maximum of 80 units.   . Insulin Pen Needle (PEN NEEDLES) 31G X 6 MM MISC 1 application by Does  not apply route at bedtime.   [name] ipratropium-albuterol (DUONEB) 0.5-2.5 (3) MG/3ML SOLN Take 3 mLs by nebulization every 6 (six) hours as needed. (Patient taking differently: Take 3 mLs by nebulization every 6 (six) hours as needed (For shortness of breath).)   . isosorbide mononitrate (IMDUR) 30 MG 24 hr tablet Take 30 mg by mouth daily.   [name] ketoconazole (NIZORAL) 2 % cream Apply 1 application topically daily.   . Lancets (ONETOUCH ULTRASOFT) lancets USE TO TEST BLOOD SUGAR 1-2 TIMES DAILY   . lubiprostone ([name]) 8 MCG capsule Take 1 capsule (8 mcg total) by mouth 2 (two) times daily with a meal.   . Multiple Vitamin (MULTIVITAMIN WITH MINERALS) TABS tablet Take 1 tablet by mouth daily.   . nitroGLYCERIN (NITROSTAT) 0.4 MG SL tablet DISSOLVE 1 TABLET UNDER THE TONGUE EVERY 5 MINUTES AS&nbsp;&nbsp;NEEDED FOR CHEST PAIN. MAX&nbsp;&nbsp;OF 3 TABLETS IN 15 MINUTES. CALL 911 IF PAIN PERSISTS. (Patient taking differently: Place 0.4 mg under the tongue every 5 (five) minutes as needed for chest pain.) 10/04/2020: Last filled 07/25/20  . nystatin (MYCOSTATIN) 100000 UNIT/ML suspension Take 5 mLs (500,000 Units total) by mouth 4 (four) times daily.   . pantoprazole (PROTONIX) 40 MG tablet TAKE ONE TABLET BY MOUTH EVERY MORNING   . predniSONE (DELTASONE) 10 MG tablet TAKE 4 TABLETS (40 MG TOTAL) BY MOUTH DAILY WITH BREAKFAST FOR 3 DAYS.   [name] Roflumilast (DALIRESP) 250 MCG TABS Take 250 mcg by mouth daily. 12/27/2020: Patient receives through [place] Patient Assistance through December 2022.  . rosuvastatin (CRESTOR) 20 MG tablet TAKE ONE TABLET BY MOUTH ONCE DAILY    No facility-administered encounter medications on file as of 01/16/2021.    Patient Active Problem List   Diagnosis Date Noted  . Tinea pedis 01/15/2021  . Pes planus 01/15/2021  . Chronic respiratory failure with hypoxia ([place]) 12/07/2020  . Oral candidiasis 12/07/2020  . Acute on chronic respiratory failure with hypoxia ([place]) 10/23/2020  .  Cardiac arrest ([place]) 10/04/2020  . Acute kidney injury superimposed on CKD ([place]) 09/23/2020  . Thiamine deficiency 07/02/2020  . Acute on chronic respiratory failure ([place]) 06/20/2020  . Obstructive sleep apnea 05/02/2020  . Hyperlipidemia   . Chronic combined systolic and diastolic heart failure ([place]) 04/07/2020  . Chronic constipation 07/19/2019  . Snoring 03/29/2019  . Insomnia due to other mental disorder 11/15/2018  . Anticoagulant long-term use 10/21/2018  . Gastroesophageal reflux disease without esophagitis 08/10/2018  . Tobacco abuse 08/10/2018  . Chest pain 07/27/2018  . Essential hypertension 07/06/2018  . Chronic obstructive pulmonary disease ([place]) 07/06/2018  . Alcohol abuse 07/06/2018  . Paroxysmal atrial fibrillation ([place]) 11/28/2017  . Slow transit constipation 06/07/2016  . Chronic anemia 06/06/2016  . Hypertensive kidney disease with chronic kidney disease stage III ([place]) 06/06/2016  . Mixed anxiety and depressive disorder 06/05/2016  . Type 2 diabetes mellitus with diabetic neuropathy, with long-term current use of insulin ([place]) 06/05/2016  .  Chronic obstructive pulmonary disease with (acute) exacerbation ([facility]) 06/05/2016    Conditions to be addressed/monitored: CHF, COPD and Anxiety  Care Plan : COPD (Adult)  Updates made by Dannielle Karvonen, RN since 01/16/2021 12:00 AM  Problem: Symptom Exacerbation (COPD)   Priority: High  Long-Range Goal: Symptom Exacerbation Prevented or Minimized   Start Date: 11/07/2020  Expected End Date: 03/21/2021  This Visit's Progress: On track  Recent Progress: Not on track  Priority: High  Current Barriers:  [name] Knowledge deficits related to basic COPD disease process and self care/management:  Patient states he is doing as well as expected. He reports the pollen continues to be an issue for him but states he is trying to stay inside and continues to wear his mask if he has to go out. Patient states he continues to use his oxygen at 3  L continuous.  Patient states his palliative care nurse is scheduled for a visit today.  . Case Manager Clinical Goal(s):   patient will be able to verbalize understanding of COPD action plan and when to seek appropriate levels of medical care: Ongoing review of COPD action plan with patient. Patient states he considers himself in the [place] zone today.    patient will verbalize basic understanding of COPD disease process and self care activities  Interventions:  . Collaboration with [name], MD regarding development and update of comprehensive plan of care as evidenced by provider attestation and co-signature . Inter-disciplinary care team collaboration (see longitudinal plan of care):  Correction from note on 01/02/2021.  [facility] is being [facility] by [facility].  . Provided patient with verbal COPD education on self care/management/and exacerbation prevention   Provided patient with COPD action plan and reinforced importance of daily self assessment: RNCM reviewed COPD action plan/ zones with patient.   medication-adherence assessment completed:  Patient reports his Potassium and Spironolactone medication is on hold until he follows up with his cardiologist in May 2022.   Patient reports he received medication assistance approval for his Daliresp.  He states he's been taking them medication since 12/27/2020.  - quality of sleep assessed  - self-awareness of symptom triggers encouraged:    Advised patient to keep follow up appointments with providers:  Reminded patient of 01/18/2021 follow up appointment with pulmonology.  Patient Goals/Self-Care Activities:  - eliminate any smoking in my home - Be mindful on pollen season which can cause your breathing issues to worsen.  Wear mask when going outside.  -  Continue to listen to public air quality announcements frequently.  -  Continue to monitor for signs of respiratory infection, including changes in sputum color,  volume and thickness, as well as fever. Report these changes to your doctor as soon as possible.  -Take your medications as prescribed.  - Keep follow up appointments with your doctors ( Reminder follow up with pulmonary doctor on 01/18/2021) - Continue to keep yourself familiar with your  COPD action plan / zones.  Call your doctor for increase in ( moderate) COPD symptoms. Call 911 for severe symptoms.  -Continue to use your oxygen as recommended by your provider and monitor oxygen saturation levels with pulse oximetry .  Follow Up Plan: The patient has been provided with contact information for the care management team and has been advised to call with any health related questions or concerns.  The care management team will reach out to the patient again over the next 30 days.  Problem: Coping Skills (General Plan of Care)   Priority: High  Long-Range Goal: Coping Skills Enhanced   Start Date: 11/07/2020  Expected End Date: 03/21/2021  This Visit's Progress: On track  Recent Progress: On track  Priority: Medium  Current Barriers:   Knowledge Deficits related to limited coping strategies for managing complex medical conditions:  Ongoing follow up with social worker to address anxiety symptoms.   Clinical Goal(s):  [name] Collaboration with [name], MD regarding development and update of comprehensive plan of care as evidenced by provider attestation and co-signature . Inter-disciplinary care team collaboration (see longitudinal plan of care)  Patient will work with care management team to establish personal coping strategies.     Interventions:   Evaluation of current treatment plan related to anxiety/ depression and patient's adherence to plan as established by provider.  Collaboration with [name], MD regarding development and update of comprehensive plan of care as evidenced by provider attestation       and co-signature  Inter-disciplinary care  team collaboration (see longitudinal plan of care)   Discussed plans with patient for ongoing care management follow up and provided patient with direct contact information for care management team  Provided opportunity for patient to express concerns, fears, feelings and expectations regarding his medical conditions/ outcomes.   Patient Goals/Self Care Activities:  . call provider office for new concerns or questions . Continue to work with RN case manager to address care coordination needs and continue to work with counselor  and/ or social worker to address mental health needs   .  laugh; watch a funny movie or comedian .  practice relaxation or meditation daily .  talk about feelings with a friend, family or spiritual advisor .  practice positive thinking and self-talk Follow Up Plan: The patient has been provided with contact information for the care management team and has been advised to call with any health related questions or concerns.  The care management team will reach out to the patient again over the next 30 days.      Care Plan : Heart Failure (Adult)  Updates made by Dannielle Karvonen, RN since 01/16/2021 12:00 AM  Problem: Disease Progression (Heart Failure)   Priority: High  Onset Date: 11/07/2020  Long-Range Goal: Patient will verbalize understanding of heart failure self management strategies   Start Date: 11/07/2020  Expected End Date: 03/21/2021  This Visit's Progress: On track  Recent Progress: On track  Priority: High  Current Barriers:  [name] Knowledge deficit related to basic heart failure pathophysiology and self care management:  Patient reports he continues to take his medication as prescribed. Reports today's weight 197. He states his weight has come down over the last few days from 198, 199, 200.3.  Patient denies any increase in swelling in lower extremities and no increase in shortness of breath.  Case Manager Clinical Goal(s):  . patient will verbalize  understanding of Heart Failure Action Plan and when to call doctor . patient will take all Heart Failure medications as prescribed . patient will weigh daily and record (notifying MD of 3 lb weight gain over night or 5 lb in a week) . Patient will monitor / decrease sodium intake . Patient will contact the RN case manager if he has difficulty obtaining his medications.  . Patient will attend scheduled provider visits.  Interventions:  . Collaboration with [name], MD regarding development and update of comprehensive plan of care as evidenced by provider  attestation and co-signature . Inter-disciplinary care team collaboration (see longitudinal plan of care): Patient states he has a scheduled visit with his palliative care nurse today.  . Basic overview and discussion of pathophysiology of Heart Failure reviewed:  Patient reports being in the [place] zone of the heart failure action plan today . Provided verbal education on low sodium diet . Discussed importance of daily weight and advised patient to weigh and record daily . Reviewed role of diuretics in prevention of fluid overload and management of heart failure . Patient will follow up with his doctor as recommended  Patient Goals/Self-Care Activities - Reinforced with patient to call office if I gain more than 3 pounds in one day or 5 pounds in one week - continue to follow a low salt diet.  - continue to watch for swelling in feet, ankles and legs every day. Report increase in symptoms to your doctor. Call 911 for severe heart failure symptoms.  - continue to weight yourself daily and record weights. - Continue to take your medications as prescribed and refill them timely.  Follow Up Plan: The patient has been provided with contact information for the care management team and has been advised to call with any health related questions or concerns.  The care management team will reach out to the patient again over the next 2  weeks.        Plan: The patient has been provided with contact information for the care management team and has been advised to call with any health related questions or concerns.  and The care management team will reach out to the patient again over the next 30 days.  Quinn Plowman RN,BSN,CCM RN Case Manager [place] [phone]

## 2021-01-17 ENCOUNTER — Other Ambulatory Visit: Payer: Self-pay | Admitting: Family Medicine

## 2021-01-17 DIAGNOSIS — E119 Type 2 diabetes mellitus without complications: Secondary | ICD-10-CM

## 2021-01-18 ENCOUNTER — Encounter: Payer: Self-pay | Admitting: Adult Health

## 2021-01-18 ENCOUNTER — Ambulatory Visit (INDEPENDENT_AMBULATORY_CARE_PROVIDER_SITE_OTHER): Payer: Medicare Other | Admitting: Adult Health

## 2021-01-18 ENCOUNTER — Other Ambulatory Visit: Payer: Self-pay

## 2021-01-18 DIAGNOSIS — J441 Chronic obstructive pulmonary disease with (acute) exacerbation: Secondary | ICD-10-CM

## 2021-01-18 DIAGNOSIS — J9611 Chronic respiratory failure with hypoxia: Secondary | ICD-10-CM

## 2021-01-18 DIAGNOSIS — I5042 Chronic combined systolic (congestive) and diastolic (congestive) heart failure: Secondary | ICD-10-CM | POA: Diagnosis not present

## 2021-01-18 DIAGNOSIS — G4733 Obstructive sleep apnea (adult) (pediatric): Secondary | ICD-10-CM

## 2021-01-18 DIAGNOSIS — J449 Chronic obstructive pulmonary disease, unspecified: Secondary | ICD-10-CM | POA: Diagnosis not present

## 2021-01-18 NOTE — Assessment & Plan Note (Signed)
Appears euvolemic on exam.  Continue on current regimen and follow-up with cardiology 

## 2021-01-18 NOTE — Assessment & Plan Note (Signed)
Patient has very severe COPD with high symptom burden is prone to recurrent exacerbations.  He is on maximum medication therapy with triple inhaler (ICS/LAMA/LABA), DuoNeb nebulizer 3 times daily, Daliresp, low-dose prednisone 10 mg daily and oxygen .  He is also on nocturnal CPAP which should help with some of his chronic hypercarbia may need to consider changing to BiPAP/bilevel to help with hypercarbia however compliance has been somewhat of an issue.  Have encouraged him to wear his CPAP anytime he is sleeping.  He is on multiple medications for anxiety which do cause some sedation and probably exacerbate his hypoventilation/hypercarbia  Plan  Patient Instructions  Continue on TRELEGY 1 puff daily, rinse after use.  Continue on Daliresp daily   Mucinex DM Twice daily  As needed  Cough/congestion  Flutter valve Three times a day   Continue on Duoneb nebs Three times a day   Continue on Oxygen 3l/m .  Continue on Prednisone to 10mg  daily  Continue on CPAP At bedtime  . -need to wear all night long and naps. Do not take off and go back to sleepy.  Use caution with sedating medications  Continue with Palliative care .  Follow up with Dr. or Verley Pariseau NP in 4 weeks  and As needed  Please contact office for sooner follow up if symptoms do not improve or worsen or seek emergency care

## 2021-01-18 NOTE — Assessment & Plan Note (Signed)
Patient is doing better on nocturnal CPAP however does not wear the entire night.  And does admit to taking off and going back to sleep.  Of encouraged him to wear anytime he is sleeping and with naps during the daytime.  Plan  Patient Instructions  Continue on TRELEGY 1 puff daily, rinse after use.  Continue on Daliresp daily   Mucinex DM Twice daily  As needed  Cough/congestion  Flutter valve Three times a day   Continue on Duoneb nebs Three times a day   Continue on Oxygen 3l/m .  Continue on Prednisone to 10mg  daily  Continue on CPAP At bedtime  . -need to wear all night long and naps. Do not take off and go back to sleepy.  Use caution with sedating medications  Continue with Palliative care .  Follow up with Dr. or Landan Fedie NP in 4 weeks  and As needed  Please contact office for sooner follow up if symptoms do not improve or worsen or seek emergency care

## 2021-01-18 NOTE — Patient Instructions (Addendum)
Continue on TRELEGY 1 puff daily, rinse after use.  Continue on Daliresp daily   Mucinex DM Twice daily  As needed  Cough/congestion  Flutter valve Three times a day   Continue on Duoneb nebs Three times a day   Continue on Oxygen 3l/m .  Continue on Prednisone to 10mg  daily  Continue on CPAP At bedtime  . -need to wear all night long and naps. Do not take off and go back to sleepy.  Use caution with sedating medications  Continue with Palliative care .  Follow up with Dr. or Edmon Magid NP in 4 weeks  and As needed  Please contact office for sooner follow up if symptoms do not improve or worsen or seek emergency care

## 2021-01-18 NOTE — Assessment & Plan Note (Signed)
continue on oxygen to maintain O2 saturations greater than 88 to 90%

## 2021-01-18 NOTE — Progress Notes (Signed)
@Patient  ID: Kenneth [name], male    DOB: October 14, 1960, 60 y.o.   MRN: [id number]  Chief Complaint  Patient presents with  . Follow-up    Referring provider: [name], MD  HPI: 60 year old male former smoker followed for very severe COPD with asthma prone to recurrent exacerbations, obstructive sleep apnea on nocturnal CPAP Medical history significant for A. fib on chronic anticoagulation, diabetes, hypertension, congestive heart failure and nonischemic cardiomyopathy  TEST/EVENTS :  Pulmonary function testing May 02, 2020 showed FEV1 at 25%, ratio 42, FVC 48%, no significant bronchodilator response., Severe mid flow obstruction with reversibility, DLCO 50%.  2D echo April 07, 2020 EF 45 to 50%, normal pulmonary artery systolic pressure  2D echo October 04, 2020 EF 55 to 94%, RV systolic function is normal  11/2020 Alpha-1 MM phenotype, level 143  01/18/2021 Follow up : COPD with asthma, obstructive sleep apnea, oxygen dependent respiratory failure Patient presents for a 6-week follow-up patient has underlying very severe COPD with asthma and is prone to recurrent exacerbations.  He has had multiple hospitalizations over the last several months and has been intubated on 2 separate occasions for acute on chronic respiratory failure with COPD exacerbation and decompensated heart failure.  He also suffered an out of hospital PEA arrest with ROSC after 5 minutes of CPR and 1 round of epi.  Was admitted again since last ov , treated with IV antibiotics steroids and nebulized bronchodilators.  sputum culture in 09/2020  was positive for abundant pansensitive Pseudomonas. ABG last on admission earlier this month showed chronic compensated Hypercarbia with pH 7.35 and PCO2 at 61  He has been started on Daliresp. And Prednisone 24m daily . Now followed by palliative care.  Patient is on multiple medications for anxiety including BuSpar, Neurontin, Prozac and trazodone and Abilfy .  Since  discharge patient is feeling better.  He says the last 2 weeks he feels like he is getting some stronger.  Continues to deal with anxiety.  He denies any flare of cough or wheezing.  No fever no discolored mucus.  No increased leg swelling.  He has known congestive heart failure.  Says that he has had no increased leg edema.  Remains on Lasix 40 mg twice daily.  Remains on oxygen 3 L.  No increased oxygen demands.  O2 saturations 99% today on POC at 3 L  He has underlying obstructive sleep apnea.  Says that he wears his CPAP every night.  CPAP download shows excellent compliance with 100% usage.  Daily average usage at 6.5 hours.  AHI 0.1.  Patient is on auto CPAP 13 to 20 cm H2O.  Average CPAP pressure 16.9 cm H2O.  He does admit to taking his CPAP off and going back to sleep most nights and during the daytime.  Have encouraged him on CPAP compliance.  Allergies  Allergen Reactions  . Lisinopril Swelling  . Other Other (See Comments)    Lettuce : rash  . Tomato Rash    Immunization History  Administered Date(s) Administered  . Influenza,inj,Quad PF,6+ Mos 06/25/2018, 05/20/2019  . Influenza-Unspecified 06/28/2020  . PFIZER(Purple Top)SARS-COV-2 Vaccination 12/22/2019, 01/16/2020, 10/29/2020  . Pneumococcal Polysaccharide-23 06/08/2016  . Zoster Recombinat (Shingrix) 07/22/2019    Past Medical History:  Diagnosis Date  . Anxiety   . Arthritis   . Atrial fibrillation ([facility]   . Chest tightness 06/12/2019  . CHF (congestive heart failure) ([facility]   . COPD (chronic obstructive pulmonary disease) (HCC)    emphysema  .  Depression   . Diabetes mellitus without complication ([place])   . Heart murmur   . Hyperlipidemia   . Hypertension   . Sleep apnea with use of continuous positive airway pressure (CPAP)     Tobacco History: Social History   Tobacco Use  Smoking Status Former Smoker  . Packs/day: 2.00  . Years: 30.00  . Pack years: 60.00  . Types: Cigarettes  . Quit date: 06/2020   . Years since quitting: 0.5  Smokeless Tobacco Never Used  Tobacco Comment   smokes 2-3cigs per day   Counseling given: Not Answered Comment: smokes 2-3cigs per day   Outpatient Medications Prior to Visit  Medication Sig Dispense Refill  . acetaminophen (TYLENOL) 325 MG tablet Take 650 mg by mouth every 6 (six) hours as needed for mild pain or headache.    . albuterol (PROVENTIL) (2.5 MG/3ML) 0.083% nebulizer solution Take 3 mLs (2.5 mg total) by nebulization every 6 (six) hours as needed for shortness of breath.    [name] amLODipine (NORVASC) 5 MG tablet Take 5 mg by mouth every morning.    . ARIPiprazole (ABILIFY) 5 MG tablet Take 7 mg by mouth daily.    . Blood Glucose Monitoring Suppl (ONE TOUCH ULTRA 2) w/Device KIT Use to test blood sugars 1-2 times daily. 1 kit 0  . busPIRone (BUSPAR) 15 MG tablet Take 1 tablet (15 mg total) by mouth 2 (two) times daily. 180 tablet 1  . carvedilol (COREG) 12.5 MG tablet Take 1 tablet (12.5 mg total) by mouth 2 (two) times daily with a meal. 180 tablet 3  . clotrimazole (MYCELEX) 10 MG troche Take 1 tablet (10 mg total) by mouth 5 (five) times daily. 35 Troche 0  . DM-GG & DM-APAP-CPM (CORICIDIN HBP DAY/NIGHT COLD) 10-20 &15-200-2 MG MISC Take 1 tablet by mouth every 12 (twelve) hours as needed (cold symptoms).    [name] ELIQUIS 5 MG TABS tablet TAKE ONE TABLET BY MOUTH EVERY MORNING and TAKE ONE TABLET BY MOUTH EVERY EVENING 180 tablet 1  . famotidine (PEPCID) 20 MG tablet Take 20 mg by mouth daily.    [name] FLUoxetine (PROZAC) 20 MG capsule TAKE 3 CAPSULES BY MOUTH  DAILY 270 capsule 3  . Fluticasone-Umeclidin-Vilant (TRELEGY ELLIPTA) 100-62.5-25 MCG/INH AEPB Inhale 1 puff into the lungs daily. 60 each 5  . furosemide (LASIX) 40 MG tablet Take 1 tablet (40 mg total) by mouth 2 (two) times daily. 60 tablet 11  . gabapentin (NEURONTIN) 100 MG capsule Take 2 capsules (200 mg total) by mouth 3 (three) times daily. 180 capsule 3  . glucose blood (ONETOUCH ULTRA)  test strip USE TO TEST BLOOD SUGAR 1-2 TIMES DAILY 100 each 11  . guaiFENesin (MUCINEX) 600 MG 12 hr tablet Take 1 tablet (600 mg total) by mouth 2 (two) times daily. 30 tablet 0  . hydrALAZINE (APRESOLINE) 25 MG tablet Take 1 tablet (25 mg total) by mouth 3 (three) times daily. 90 tablet 5  . insulin glargine (LANTUS SOLOSTAR) 100 UNIT/ML Solostar Pen Inject 55 Units into the skin at bedtime. If fasting blood sugars remain >150 for 1 week, increase insulin dose by 5 units. Maximum of 80 units. 15 mL 2  . Insulin Pen Needle (PEN NEEDLES) 31G X 6 MM MISC 1 application by Does not apply route at bedtime. 100 each 1  . ipratropium-albuterol (DUONEB) 0.5-2.5 (3) MG/3ML SOLN Take 3 mLs by nebulization every 6 (six) hours as needed. (Patient taking differently: Take 3 mLs by nebulization every  6 (six) hours as needed (For shortness of breath).) 360 mL 0  . isosorbide mononitrate (IMDUR) 30 MG 24 hr tablet Take 30 mg by mouth daily.    [name] ketoconazole (NIZORAL) 2 % cream Apply 1 application topically daily. 60 g 2  . Lancets (ONETOUCH ULTRASOFT) lancets USE TO TEST BLOOD SUGAR 1-2 TIMES DAILY 100 each 11  . lubiprostone ([name]) 8 MCG capsule Take 1 capsule (8 mcg total) by mouth 2 (two) times daily with a meal. 60 capsule 0  . Multiple Vitamin (MULTIVITAMIN WITH MINERALS) TABS tablet Take 1 tablet by mouth daily.    . nitroGLYCERIN (NITROSTAT) 0.4 MG SL tablet DISSOLVE 1 TABLET UNDER THE TONGUE EVERY 5 MINUTES AS  NEEDED FOR CHEST PAIN. MAX  OF 3 TABLETS IN 15 MINUTES. CALL 911 IF PAIN PERSISTS. (Patient taking differently: Place 0.4 mg under the tongue every 5 (five) minutes as needed for chest pain.) 75 tablet 4  . nystatin (MYCOSTATIN) 100000 UNIT/ML suspension Take 5 mLs (500,000 Units total) by mouth 4 (four) times daily. 100 mL 0  . pantoprazole (PROTONIX) 40 MG tablet TAKE ONE TABLET BY MOUTH EVERY MORNING 30 tablet 4  . predniSONE (DELTASONE) 10 MG tablet TAKE 4 TABLETS (40 MG TOTAL) BY MOUTH DAILY  WITH BREAKFAST FOR 3 DAYS. 12 tablet 0  . Roflumilast (DALIRESP) 250 MCG TABS Take 250 mcg by mouth daily. 30 tablet 11  . rosuvastatin (CRESTOR) 20 MG tablet TAKE ONE TABLET BY MOUTH ONCE DAILY 30 tablet 3  . ARIPiprazole (ABILIFY) 5 MG tablet Take 5 mg by mouth daily.     No facility-administered medications prior to visit.     Review of Systems:   Constitutional:   No  weight loss, night sweats,  Fevers, chills, + fatigue, or  lassitude.  HEENT:   No headaches,  Difficulty swallowing,  Tooth/dental problems, or  Sore throat,                No sneezing, itching, ear ache, nasal congestion, post nasal drip,   CV:  No chest pain,  Orthopnea, PND, swelling in lower extremities, anasarca, dizziness, palpitations, syncope.   GI  No heartburn, indigestion, abdominal pain, nausea, vomiting, diarrhea, change in bowel habits, loss of appetite, bloody stools.   Resp:   No chest wall deformity  Skin: no rash or lesions.  GU: no dysuria, change in color of urine, no urgency or frequency.  No flank pain, no hematuria   MS:  No joint pain or swelling.  No decreased range of motion.  No back pain.    Physical Exam  BP (!) 156/82   Pulse 91   Ht 6' (1.829 m)   Wt 199 lb (90.3 kg)   SpO2 99%   BMI 26.99 kg/m   GEN: A/Ox3; pleasant , NAD, chronically ill-appearing, on oxygen   HEENT:  [place]/AT, NOSE-clear, THROAT-clear, no lesions, no postnasal drip or exudate noted.  Class III MP airway.  Poor dentition  NECK:  Supple w/ fair ROM; no JVD; normal carotid impulses w/o bruits; no thyromegaly or nodules palpated; no lymphadenopathy.    RESP decreased breath sounds in the bases  no accessory muscle use, no dullness to percussion  CARD:  RRR, no m/r/g, no peripheral edema, pulses intact, no cyanosis or clubbing.  GI:   Soft & nt; nml bowel sounds; no organomegaly or masses detected.   Musco: Warm bil, no deformities or joint swelling noted.   Neuro: alert, no focal deficits noted.  Skin: Warm, no lesions or rashes    Lab Results:  CBC    Component Value Date/Time   WBC 9.5 12/31/2020 1006   RBC 3.68 (L) 12/31/2020 1006   HGB 10.2 (L) 12/31/2020 1006   HCT 32.4 (L) 12/31/2020 1006   PLT 265.0 12/31/2020 1006   MCV 88.0 12/31/2020 1006   MCH 28.8 12/25/2020 0254   MCHC 31.4 12/31/2020 1006   RDW 14.9 12/31/2020 1006   LYMPHSABS 0.8 12/23/2020 1203   MONOABS 0.5 12/23/2020 1203   EOSABS 0.0 12/23/2020 1203   BASOSABS 0.0 12/23/2020 1203    BMET    Component Value Date/Time   NA 138 12/31/2020 1006   NA 134 07/05/2020 0853   K 4.2 12/31/2020 1006   CL 94 (L) 12/31/2020 1006   CO2 38 (H) 12/31/2020 1006   GLUCOSE 234 (H) 12/31/2020 1006   BUN 16 12/31/2020 1006   BUN 9 07/05/2020 0853   CREATININE 1.36 12/31/2020 1006   CALCIUM 9.0 12/31/2020 1006   GFRNONAA 58 (L) 12/25/2020 0254   GFRAA 85 07/05/2020 0853    BNP    Component Value Date/Time   BNP 393.8 (H) 12/23/2020 1203    ProBNP No results found for: PROBNP  Imaging: DG Chest Port 1 View  Result Date: 12/23/2020 CLINICAL DATA:  Shortness of breath. EXAM: PORTABLE CHEST 1 VIEW COMPARISON:  Radiographs 10/22/2020.  CT 10/03/2020. FINDINGS: 1221 hours. Two views obtained. The heart size and mediastinal contours are stable with aortic atherosclerosis. The lungs are hyperinflated but clear. There is no pleural effusion or pneumothorax. Evidence of healing anterior rib fractures bilaterally. No acute osseous findings. Telemetry leads overlie the chest. IMPRESSION: No evidence of acute cardiopulmonary process. Healing rib fractures bilaterally. Emphysema. Electronically Signed   By: Richardean Sale M.D.   On: 12/23/2020 12:44      PFT Results Latest Ref Rng & Units 05/02/2020  FVC-Pre L 1.90  FVC-Predicted Pre % 45  FVC-Post L 2.06  FVC-Predicted Post % 48  Pre FEV1/FVC % % 43  Post FEV1/FCV % % 42  FEV1-Pre L 0.81  FEV1-Predicted Pre % 24  FEV1-Post L 0.86  DLCO uncorrected  ml/min/mmHg 14.61  DLCO UNC% % 50  DLCO corrected ml/min/mmHg 14.61  DLCO COR %Predicted % 50  DLVA Predicted % 72  TLC L 7.93  TLC % Predicted % 110  RV % Predicted % 220    No results found for: NITRICOXIDE      Assessment & Plan:   Chronic combined systolic and diastolic heart failure (HCC) Appears euvolemic on exam.  Continue on current regimen and follow-up with cardiology.  Chronic obstructive pulmonary disease (HCC) Patient has very severe COPD with high symptom burden is prone to recurrent exacerbations.  He is on maximum medication therapy with triple inhaler (ICS/LAMA/LABA), DuoNeb nebulizer 3 times daily, Daliresp, low-dose prednisone 10 mg daily and oxygen .  He is also on nocturnal CPAP which should help with some of his chronic hypercarbia may need to consider changing to BiPAP/bilevel to help with hypercarbia however compliance has been somewhat of an issue.  Have encouraged him to wear his CPAP anytime he is sleeping.  He is on multiple medications for anxiety which do cause some sedation and probably exacerbate his hypoventilation/hypercarbia  Plan  Patient Instructions  Continue on TRELEGY 1 puff daily, rinse after use.  Continue on Daliresp daily   Mucinex DM Twice daily  As needed  Cough/congestion  Flutter valve Three times a day  Continue on Duoneb nebs Three times a day   Continue on Oxygen 3l/m .  Continue on Prednisone to 3m daily  Continue on CPAP At bedtime  . -need to wear all night long and naps. Do not take off and go back to sleepy.  Use caution with sedating medications  Continue with Palliative care .  Follow up with Dr. BLamonte Sakaior Yulieth Carrender NP in 4 weeks  and As needed  Please contact office for sooner follow up if symptoms do not improve or worsen or seek emergency care       Obstructive sleep apnea Patient is doing better on nocturnal CPAP however does not wear the entire night.  And does admit to taking off and going back to sleep.  Of  encouraged him to wear anytime he is sleeping and with naps during the daytime.  Plan  Patient Instructions  Continue on TRELEGY 1 puff daily, rinse after use.  Continue on Daliresp daily   Mucinex DM Twice daily  As needed  Cough/congestion  Flutter valve Three times a day   Continue on Duoneb nebs Three times a day   Continue on Oxygen 3l/m .  Continue on Prednisone to 168mdaily  Continue on CPAP At bedtime  . -need to wear all night long and naps. Do not take off and go back to sleepy.  Use caution with sedating medications  Continue with Palliative care .  Follow up with Dr. ByLamonte Sakair Edker Punt NP in 4 weeks  and As needed  Please contact office for sooner follow up if symptoms do not improve or worsen or seek emergency care       Chronic respiratory failure with hypoxia ([place] to maintain O2 saturations greater than 88 to 90%     TaRexene EdisonNP 01/18/2021

## 2021-01-21 DIAGNOSIS — J449 Chronic obstructive pulmonary disease, unspecified: Secondary | ICD-10-CM | POA: Diagnosis not present

## 2021-01-22 ENCOUNTER — Encounter (HOSPITAL_COMMUNITY): Payer: Self-pay | Admitting: Emergency Medicine

## 2021-01-22 ENCOUNTER — Telehealth: Payer: Self-pay

## 2021-01-22 ENCOUNTER — Emergency Department (HOSPITAL_COMMUNITY): Payer: Medicare Other

## 2021-01-22 ENCOUNTER — Inpatient Hospital Stay (HOSPITAL_COMMUNITY)
Admission: EM | Admit: 2021-01-22 | Discharge: 2021-01-24 | DRG: 189 | Disposition: A | Discharge status: Home / Self Care | Payer: Medicare Other | Attending: Internal Medicine | Admitting: Internal Medicine

## 2021-01-22 ENCOUNTER — Other Ambulatory Visit: Payer: Self-pay

## 2021-01-22 DIAGNOSIS — J441 Chronic obstructive pulmonary disease with (acute) exacerbation: Secondary | ICD-10-CM | POA: Diagnosis not present

## 2021-01-22 DIAGNOSIS — Z20822 Contact with and (suspected) exposure to covid-19: Secondary | ICD-10-CM | POA: Diagnosis present

## 2021-01-22 DIAGNOSIS — Z7952 Long term (current) use of systemic steroids: Secondary | ICD-10-CM

## 2021-01-22 DIAGNOSIS — G4733 Obstructive sleep apnea (adult) (pediatric): Secondary | ICD-10-CM | POA: Diagnosis not present

## 2021-01-22 DIAGNOSIS — E1140 Type 2 diabetes mellitus with diabetic neuropathy, unspecified: Secondary | ICD-10-CM | POA: Diagnosis present

## 2021-01-22 DIAGNOSIS — E876 Hypokalemia: Secondary | ICD-10-CM | POA: Diagnosis not present

## 2021-01-22 DIAGNOSIS — N183 Chronic kidney disease, stage 3 unspecified: Secondary | ICD-10-CM | POA: Diagnosis present

## 2021-01-22 DIAGNOSIS — Z87891 Personal history of nicotine dependence: Secondary | ICD-10-CM

## 2021-01-22 DIAGNOSIS — E1122 Type 2 diabetes mellitus with diabetic chronic kidney disease: Secondary | ICD-10-CM

## 2021-01-22 DIAGNOSIS — I499 Cardiac arrhythmia, unspecified: Secondary | ICD-10-CM | POA: Diagnosis not present

## 2021-01-22 DIAGNOSIS — M1990 Unspecified osteoarthritis, unspecified site: Secondary | ICD-10-CM | POA: Diagnosis present

## 2021-01-22 DIAGNOSIS — J9621 Acute and chronic respiratory failure with hypoxia: Principal | ICD-10-CM | POA: Diagnosis present

## 2021-01-22 DIAGNOSIS — K5909 Other constipation: Secondary | ICD-10-CM

## 2021-01-22 DIAGNOSIS — F32A Depression, unspecified: Secondary | ICD-10-CM | POA: Diagnosis not present

## 2021-01-22 DIAGNOSIS — R0689 Other abnormalities of breathing: Secondary | ICD-10-CM | POA: Diagnosis not present

## 2021-01-22 DIAGNOSIS — Z8249 Family history of ischemic heart disease and other diseases of the circulatory system: Secondary | ICD-10-CM

## 2021-01-22 DIAGNOSIS — Z7901 Long term (current) use of anticoagulants: Secondary | ICD-10-CM

## 2021-01-22 DIAGNOSIS — I5043 Acute on chronic combined systolic (congestive) and diastolic (congestive) heart failure: Secondary | ICD-10-CM | POA: Diagnosis not present

## 2021-01-22 DIAGNOSIS — Z841 Family history of disorders of kidney and ureter: Secondary | ICD-10-CM

## 2021-01-22 DIAGNOSIS — J439 Emphysema, unspecified: Secondary | ICD-10-CM | POA: Diagnosis not present

## 2021-01-22 DIAGNOSIS — E1159 Type 2 diabetes mellitus with other circulatory complications: Secondary | ICD-10-CM | POA: Diagnosis present

## 2021-01-22 DIAGNOSIS — I480 Paroxysmal atrial fibrillation: Secondary | ICD-10-CM | POA: Diagnosis not present

## 2021-01-22 DIAGNOSIS — Z79899 Other long term (current) drug therapy: Secondary | ICD-10-CM

## 2021-01-22 DIAGNOSIS — I248 Other forms of acute ischemic heart disease: Secondary | ICD-10-CM | POA: Diagnosis not present

## 2021-01-22 DIAGNOSIS — Z91018 Allergy to other foods: Secondary | ICD-10-CM

## 2021-01-22 DIAGNOSIS — N1831 Chronic kidney disease, stage 3a: Secondary | ICD-10-CM | POA: Diagnosis not present

## 2021-01-22 DIAGNOSIS — I152 Hypertension secondary to endocrine disorders: Secondary | ICD-10-CM | POA: Diagnosis not present

## 2021-01-22 DIAGNOSIS — Z888 Allergy status to other drugs, medicaments and biological substances status: Secondary | ICD-10-CM | POA: Diagnosis not present

## 2021-01-22 DIAGNOSIS — E785 Hyperlipidemia, unspecified: Secondary | ICD-10-CM | POA: Diagnosis not present

## 2021-01-22 DIAGNOSIS — I10 Essential (primary) hypertension: Secondary | ICD-10-CM

## 2021-01-22 DIAGNOSIS — I509 Heart failure, unspecified: Secondary | ICD-10-CM | POA: Diagnosis not present

## 2021-01-22 DIAGNOSIS — I1310 Hypertensive heart and chronic kidney disease without heart failure, with stage 1 through stage 4 chronic kidney disease, or unspecified chronic kidney disease: Secondary | ICD-10-CM | POA: Diagnosis present

## 2021-01-22 DIAGNOSIS — E1169 Type 2 diabetes mellitus with other specified complication: Secondary | ICD-10-CM | POA: Diagnosis present

## 2021-01-22 DIAGNOSIS — R0600 Dyspnea, unspecified: Secondary | ICD-10-CM | POA: Diagnosis not present

## 2021-01-22 DIAGNOSIS — S2231XA Fracture of one rib, right side, initial encounter for closed fracture: Secondary | ICD-10-CM | POA: Diagnosis not present

## 2021-01-22 DIAGNOSIS — F419 Anxiety disorder, unspecified: Secondary | ICD-10-CM | POA: Diagnosis present

## 2021-01-22 DIAGNOSIS — Z833 Family history of diabetes mellitus: Secondary | ICD-10-CM

## 2021-01-22 DIAGNOSIS — Z794 Long term (current) use of insulin: Secondary | ICD-10-CM | POA: Diagnosis not present

## 2021-01-22 DIAGNOSIS — I110 Hypertensive heart disease with heart failure: Secondary | ICD-10-CM | POA: Diagnosis not present

## 2021-01-22 DIAGNOSIS — I129 Hypertensive chronic kidney disease with stage 1 through stage 4 chronic kidney disease, or unspecified chronic kidney disease: Secondary | ICD-10-CM | POA: Diagnosis present

## 2021-01-22 DIAGNOSIS — R6889 Other general symptoms and signs: Secondary | ICD-10-CM | POA: Diagnosis not present

## 2021-01-22 DIAGNOSIS — Z743 Need for continuous supervision: Secondary | ICD-10-CM | POA: Diagnosis not present

## 2021-01-22 LAB — TROPONIN I (HIGH SENSITIVITY)
Troponin I (High Sensitivity): 19 ng/L — ABNORMAL HIGH (ref <18)
Troponin I (High Sensitivity): 42 ng/L — ABNORMAL HIGH (ref <18)

## 2021-01-22 LAB — CBC
HCT: 34.7 % — ABNORMAL LOW (ref 39.0–52.0)
Hemoglobin: 10.2 g/dL — ABNORMAL LOW (ref 13.0–17.0)
MCH: 27.3 pg (ref 26.0–34.0)
MCHC: 29.4 g/dL — ABNORMAL LOW (ref 30.0–36.0)
MCV: 92.8 fL (ref 80.0–100.0)
Platelets: 321 10*3/uL (ref 150–400)
RBC: 3.74 MIL/uL — ABNORMAL LOW (ref 4.22–5.81)
RDW: 13 % (ref 11.5–15.5)
WBC: 8.6 10*3/uL (ref 4.0–10.5)
nRBC: 0 % (ref 0.0–0.2)

## 2021-01-22 LAB — RESP PANEL BY RT-PCR (FLU A&B, COVID) ARPGX2
Influenza A by PCR: NEGATIVE
Influenza B by PCR: NEGATIVE
SARS Coronavirus 2 by RT PCR: NEGATIVE

## 2021-01-22 LAB — BASIC METABOLIC PANEL
Anion gap: 9 (ref 5–15)
BUN: 13 mg/dL (ref 6–20)
CO2: 34 mmol/L — ABNORMAL HIGH (ref 22–32)
Calcium: 8.7 mg/dL — ABNORMAL LOW (ref 8.9–10.3)
Chloride: 95 mmol/L — ABNORMAL LOW (ref 98–111)
Creatinine, Ser: 1.42 mg/dL — ABNORMAL HIGH (ref 0.61–1.24)
GFR, Estimated: 57 mL/min — ABNORMAL LOW (ref >60)
Glucose, Bld: 146 mg/dL — ABNORMAL HIGH (ref 70–99)
Potassium: 3.1 mmol/L — ABNORMAL LOW (ref 3.5–5.1)
Sodium: 138 mmol/L (ref 135–145)

## 2021-01-22 LAB — BRAIN NATRIURETIC PEPTIDE: B Natriuretic Peptide: 744.3 pg/mL — ABNORMAL HIGH (ref 0.0–100.0)

## 2021-01-22 LAB — D-DIMER, QUANTITATIVE: D-Dimer, Quant: 0.41 ug/mL-FEU (ref 0.00–0.50)

## 2021-01-22 MED ORDER — ISOSORBIDE MONONITRATE ER 30 MG PO TB24
30.0000 mg | ORAL_TABLET | Freq: Every day | ORAL | Status: DC
  Administered 2021-01-23 – 2021-01-24 (×2): 30 mg via ORAL
  Filled 2021-01-22 (×2): qty 1

## 2021-01-22 MED ORDER — IPRATROPIUM BROMIDE 0.02 % IN SOLN
0.5000 mg | Freq: Once | RESPIRATORY_TRACT | Status: AC
  Administered 2021-01-22: 0.5 mg via RESPIRATORY_TRACT
  Filled 2021-01-22: qty 2.5

## 2021-01-22 MED ORDER — SPIRONOLACTONE 25 MG PO TABS
25.0000 mg | ORAL_TABLET | Freq: Every day | ORAL | Status: DC
  Administered 2021-01-23 – 2021-01-24 (×2): 25 mg via ORAL
  Filled 2021-01-22 (×2): qty 1

## 2021-01-22 MED ORDER — APIXABAN 5 MG PO TABS
5.0000 mg | ORAL_TABLET | Freq: Two times a day (BID) | ORAL | Status: DC
  Administered 2021-01-23 – 2021-01-24 (×4): 5 mg via ORAL
  Filled 2021-01-22 (×4): qty 1

## 2021-01-22 MED ORDER — ACETAMINOPHEN 650 MG RE SUPP: 650.0000 mg | Freq: Four times a day (QID) | RECTAL | Status: DC | PRN

## 2021-01-22 MED ORDER — FLUOXETINE HCL 20 MG PO CAPS
20.0000 mg | ORAL_CAPSULE | Freq: Every day | ORAL | Status: DC
  Administered 2021-01-23 – 2021-01-24 (×2): 20 mg via ORAL
  Filled 2021-01-22 (×2): qty 1

## 2021-01-22 MED ORDER — CARVEDILOL 12.5 MG PO TABS
12.5000 mg | ORAL_TABLET | Freq: Two times a day (BID) | ORAL | Status: DC
  Administered 2021-01-23 – 2021-01-24 (×3): 12.5 mg via ORAL
  Filled 2021-01-22 (×3): qty 1

## 2021-01-22 MED ORDER — POTASSIUM CHLORIDE 20 MEQ PO PACK
60.0000 meq | PACK | Freq: Once | ORAL | Status: AC
  Administered 2021-01-23: 60 meq via ORAL
  Filled 2021-01-22: qty 3

## 2021-01-22 MED ORDER — ARIPIPRAZOLE 5 MG PO TABS
7.0000 mg | ORAL_TABLET | Freq: Every day | ORAL | Status: DC
  Administered 2021-01-23 – 2021-01-24 (×2): 7 mg via ORAL
  Filled 2021-01-22 (×2): qty 1

## 2021-01-22 MED ORDER — AMLODIPINE BESYLATE 5 MG PO TABS
5.0000 mg | ORAL_TABLET | Freq: Every morning | ORAL | Status: DC
  Administered 2021-01-23 – 2021-01-24 (×2): 5 mg via ORAL
  Filled 2021-01-22 (×2): qty 1

## 2021-01-22 MED ORDER — IPRATROPIUM-ALBUTEROL 0.5-2.5 (3) MG/3ML IN SOLN
3.0000 mL | Freq: Three times a day (TID) | RESPIRATORY_TRACT | Status: DC
  Administered 2021-01-23: 3 mL via RESPIRATORY_TRACT
  Filled 2021-01-22: qty 3

## 2021-01-22 MED ORDER — ROFLUMILAST 500 MCG PO TABS
250.0000 ug | ORAL_TABLET | Freq: Every day | ORAL | Status: DC
  Administered 2021-01-23 – 2021-01-24 (×2): 250 ug via ORAL
  Filled 2021-01-22 (×2): qty 1

## 2021-01-22 MED ORDER — INSULIN ASPART 100 UNIT/ML IJ SOLN
0.0000 [IU] | Freq: Three times a day (TID) | INTRAMUSCULAR | Status: DC
  Administered 2021-01-23: 7 [IU] via SUBCUTANEOUS

## 2021-01-22 MED ORDER — SENNOSIDES-DOCUSATE SODIUM 8.6-50 MG PO TABS: 1.0000 | ORAL_TABLET | Freq: Every evening | ORAL | Status: DC | PRN

## 2021-01-22 MED ORDER — METHYLPREDNISOLONE SODIUM SUCC 40 MG IJ SOLR
40.0000 mg | Freq: Two times a day (BID) | INTRAMUSCULAR | Status: DC
  Administered 2021-01-23 – 2021-01-24 (×3): 40 mg via INTRAVENOUS
  Filled 2021-01-22 (×3): qty 1

## 2021-01-22 MED ORDER — ALBUTEROL SULFATE (2.5 MG/3ML) 0.083% IN NEBU: 2.5000 mg | INHALATION_SOLUTION | Freq: Four times a day (QID) | RESPIRATORY_TRACT | Status: DC | PRN

## 2021-01-22 MED ORDER — ROSUVASTATIN CALCIUM 20 MG PO TABS
20.0000 mg | ORAL_TABLET | Freq: Every day | ORAL | Status: DC
  Administered 2021-01-23 – 2021-01-24 (×2): 20 mg via ORAL
  Filled 2021-01-22 (×3): qty 1

## 2021-01-22 MED ORDER — ONDANSETRON HCL 4 MG/2ML IJ SOLN: 4.0000 mg | Freq: Four times a day (QID) | INTRAMUSCULAR | Status: DC | PRN

## 2021-01-22 MED ORDER — FUROSEMIDE 10 MG/ML IJ SOLN
40.0000 mg | Freq: Once | INTRAMUSCULAR | Status: AC
  Administered 2021-01-22: 40 mg via INTRAVENOUS
  Filled 2021-01-22: qty 4

## 2021-01-22 MED ORDER — METHYLPREDNISOLONE SODIUM SUCC 125 MG IJ SOLR
125.0000 mg | Freq: Once | INTRAMUSCULAR | Status: AC
  Administered 2021-01-22: 125 mg via INTRAVENOUS
  Filled 2021-01-22: qty 2

## 2021-01-22 MED ORDER — ALBUTEROL SULFATE (2.5 MG/3ML) 0.083% IN NEBU
5.0000 mg | INHALATION_SOLUTION | Freq: Once | RESPIRATORY_TRACT | Status: AC
  Administered 2021-01-22: 5 mg via RESPIRATORY_TRACT
  Filled 2021-01-22: qty 6

## 2021-01-22 MED ORDER — BUSPIRONE HCL 5 MG PO TABS
15.0000 mg | ORAL_TABLET | Freq: Two times a day (BID) | ORAL | Status: DC
  Administered 2021-01-23 – 2021-01-24 (×4): 15 mg via ORAL
  Filled 2021-01-22 (×4): qty 1

## 2021-01-22 MED ORDER — ACETAMINOPHEN 325 MG PO TABS: 650.0000 mg | ORAL_TABLET | Freq: Four times a day (QID) | ORAL | Status: DC | PRN

## 2021-01-22 MED ORDER — FLUTICASONE-UMECLIDIN-VILANT 100-62.5-25 MCG/INH IN AEPB: 1.0000 | INHALATION_SPRAY | Freq: Every day | RESPIRATORY_TRACT | Status: DC

## 2021-01-22 MED ORDER — HYDRALAZINE HCL 25 MG PO TABS
25.0000 mg | ORAL_TABLET | Freq: Three times a day (TID) | ORAL | Status: DC
  Administered 2021-01-23 (×2): 25 mg via ORAL
  Filled 2021-01-22 (×2): qty 1

## 2021-01-22 MED ORDER — INSULIN GLARGINE 100 UNIT/ML ~~LOC~~ SOLN
35.0000 [IU] | Freq: Every day | SUBCUTANEOUS | Status: DC
  Administered 2021-01-23: 35 [IU] via SUBCUTANEOUS
  Filled 2021-01-22 (×2): qty 0.35

## 2021-01-22 MED ORDER — FAMOTIDINE 20 MG PO TABS
20.0000 mg | ORAL_TABLET | Freq: Every day | ORAL | Status: DC
  Administered 2021-01-23 – 2021-01-24 (×2): 20 mg via ORAL
  Filled 2021-01-22 (×2): qty 1

## 2021-01-22 MED ORDER — FUROSEMIDE 40 MG PO TABS
40.0000 mg | ORAL_TABLET | Freq: Two times a day (BID) | ORAL | Status: DC
  Administered 2021-01-23 – 2021-01-24 (×3): 40 mg via ORAL
  Filled 2021-01-22 (×3): qty 1

## 2021-01-22 MED ORDER — ONDANSETRON HCL 4 MG PO TABS: 4.0000 mg | ORAL_TABLET | Freq: Four times a day (QID) | ORAL | Status: DC | PRN

## 2021-01-22 MED ORDER — GUAIFENESIN ER 600 MG PO TB12
600.0000 mg | ORAL_TABLET | Freq: Two times a day (BID) | ORAL | Status: DC
  Administered 2021-01-23 – 2021-01-24 (×4): 600 mg via ORAL
  Filled 2021-01-22 (×4): qty 1

## 2021-01-22 MED ORDER — SODIUM CHLORIDE 0.9% FLUSH
3.0000 mL | Freq: Two times a day (BID) | INTRAVENOUS | Status: DC
  Administered 2021-01-23 – 2021-01-24 (×4): 3 mL via INTRAVENOUS

## 2021-01-22 NOTE — ED Triage Notes (Signed)
Pt BIB [facility] for respiratory distress on CPAP. Pt reports shob started 4 hours ago, no relief with home duo nebs. Per EMS pt was initially tachy in 130s, BP 230/130, and 80% on nebulizer. Given, 4 sublingual nitros, placed on CPAP, new vitals after 160/100, 110HR, 30RR. 18g LFA. GCS 15

## 2021-01-22 NOTE — Progress Notes (Signed)
Chronic Care Management Pharmacy Assistant   Name: Kenneth [name]  MRN: [id number] DOB: [birth year]  Reason for Encounter: Medication Review/Medication Coordination call.   Recent office visits:  01/16/2021 Quinn Plowman RN (PCP Office) 01/02/2021 Quinn Plowman RN (PCP Office)  Recent consult visits:  01/18/2021 Rexene Edison NP (Pulmonology) Aripiprazole change in therapy from 5 mg to 7 mg daily. 01/15/2021 Lanae Crumbly DPM (Podiatry) Started 2% Ketoconazole cream 1 application topical daily  Hospital visits:  None in previous 6 months  Medications: Outpatient Encounter Medications as of 01/22/2021  Medication Sig Note  . acetaminophen (TYLENOL) 325 MG tablet Take 650 mg by mouth every 6 (six) hours as needed for mild pain or headache.   . albuterol (PROVENTIL) (2.5 MG/3ML) 0.083% nebulizer solution Take 3 mLs (2.5 mg total) by nebulization every 6 (six) hours as needed for shortness of breath.   [name] amLODipine (NORVASC) 5 MG tablet Take 5 mg by mouth every morning.   . ARIPiprazole (ABILIFY) 5 MG tablet Take 7 mg by mouth daily.   . Blood Glucose Monitoring Suppl (ONE TOUCH ULTRA 2) w/Device KIT Use to test blood sugars 1-2 times daily.   . busPIRone (BUSPAR) 15 MG tablet Take 1 tablet (15 mg total) by mouth 2 (two) times daily. 10/29/2020: Prescribed by  Madison Hickman, NP    . carvedilol (COREG) 12.5 MG tablet Take 1 tablet (12.5 mg total) by mouth 2 (two) times daily with a meal.   . clotrimazole (MYCELEX) 10 MG troche Take 1 tablet (10 mg total) by mouth 5 (five) times daily.   [name] DM-GG & DM-APAP-CPM (CORICIDIN HBP DAY/NIGHT COLD) 10-20 &15-200-2 MG MISC Take 1 tablet by mouth every 12 (twelve) hours as needed (cold symptoms).   [name] ELIQUIS 5 MG TABS tablet TAKE ONE TABLET BY MOUTH EVERY MORNING and TAKE ONE TABLET BY MOUTH EVERY EVENING   . famotidine (PEPCID) 20 MG tablet Take 20 mg by mouth daily.   [name] FLUoxetine (PROZAC) 20 MG capsule TAKE 3 CAPSULES BY MOUTH  DAILY 10/29/2020:  Prescribed by  Madison Hickman, NP   . Fluticasone-Umeclidin-Vilant (TRELEGY ELLIPTA) 100-62.5-25 MCG/INH AEPB Inhale 1 puff into the lungs daily.   . furosemide (LASIX) 40 MG tablet Take 1 tablet (40 mg total) by mouth 2 (two) times daily.   [name] gabapentin (NEURONTIN) 100 MG capsule Take 2 capsules (200 mg total) by mouth 3 (three) times daily.   [name] glucose blood (ONETOUCH ULTRA) test strip USE TO TEST BLOOD SUGAR 1-2 TIMES DAILY   . guaiFENesin (MUCINEX) 600 MG 12 hr tablet Take 1 tablet (600 mg total) by mouth 2 (two) times daily.   . hydrALAZINE (APRESOLINE) 25 MG tablet Take 1 tablet (25 mg total) by mouth 3 (three) times daily.   . insulin glargine (LANTUS SOLOSTAR) 100 UNIT/ML Solostar Pen Inject 55 Units into the skin at bedtime. If fasting blood sugars remain >150 for 1 week, increase insulin dose by 5 units. Maximum of 80 units.   . Insulin Pen Needle (PEN NEEDLES) 31G X 6 MM MISC 1 application by Does not apply route at bedtime.   [name] ipratropium-albuterol (DUONEB) 0.5-2.5 (3) MG/3ML SOLN Take 3 mLs by nebulization every 6 (six) hours as needed. (Patient taking differently: Take 3 mLs by nebulization every 6 (six) hours as needed (For shortness of breath).)   . isosorbide mononitrate (IMDUR) 30 MG 24 hr tablet Take 30 mg by mouth daily.   [name] ketoconazole (NIZORAL) 2 % cream Apply 1 application topically daily.   [name]  Lancets (ONETOUCH ULTRASOFT) lancets USE TO TEST BLOOD SUGAR 1-2 TIMES DAILY   . lubiprostone ([name]) 8 MCG capsule Take 1 capsule (8 mcg total) by mouth 2 (two) times daily with a meal.   . Multiple Vitamin (MULTIVITAMIN WITH MINERALS) TABS tablet Take 1 tablet by mouth daily.   . nitroGLYCERIN (NITROSTAT) 0.4 MG SL tablet DISSOLVE 1 TABLET UNDER THE TONGUE EVERY 5 MINUTES AS&nbsp;&nbsp;NEEDED FOR CHEST PAIN. MAX&nbsp;&nbsp;OF 3 TABLETS IN 15 MINUTES. CALL 911 IF PAIN PERSISTS. (Patient taking differently: Place 0.4 mg under the tongue every 5 (five) minutes as needed for chest  pain.) 10/04/2020: Last filled 07/25/20  . nystatin (MYCOSTATIN) 100000 UNIT/ML suspension Take 5 mLs (500,000 Units total) by mouth 4 (four) times daily.   . pantoprazole (PROTONIX) 40 MG tablet TAKE ONE TABLET BY MOUTH EVERY MORNING   . predniSONE (DELTASONE) 10 MG tablet TAKE 4 TABLETS (40 MG TOTAL) BY MOUTH DAILY WITH BREAKFAST FOR 3 DAYS.   [name] Roflumilast (DALIRESP) 250 MCG TABS Take 250 mcg by mouth daily. 12/27/2020: Patient receives through [place] Patient Assistance through December 2022.  . rosuvastatin (CRESTOR) 20 MG tablet TAKE ONE TABLET BY MOUTH ONCE DAILY    No facility-administered encounter medications on file as of 01/22/2021.   Star Rating Drugs: Rosuvastatin 20 mg last filled on 12/26/2020 for 30 Day supply at [facility].  Reviewed chart for medication changes ahead of medication coordination call.  BP Readings from Last 3 Encounters:  01/18/21 (!) 156/82  12/31/20 138/74  12/25/20 (!) 176/93    Lab Results  Component Value Date   HGBA1C 14.4 (H) 12/23/2020     Patient obtains medications through V[facility]  30 Days   Last adherence delivery included:   Amlodipine 5MG Tablet Daily -Breakfast  Aripiprazole 5 mg daily   Buspirone 15 MGoneTablet Two Times Daily - Breakfast, Evening meals  Eliquis 5 MGOneTablets twice daily -Breakfast, Evening Meals  Fluoxetine 20 MG Capsule Three Capsules Daily  Furosemide 40 MgOneTablet Twice Daily - Breakfast, Evening Meals   Pantoprazole 40 MGOneTablet Daily -Breakfast  Rosuvastatin 20 mg daily  Albuterol 108 MCG/ACT inhalerPRN   Lubiprostone 8 MCGOneCapsule Twice Daily- Breakfast, Evening Meals  Potassium 20 meqtwo tablets by mouth daily  Hydralazine 25 mgTake one tablet 3 times daily  insulin glargine (LANTUS SOLOSTAR) 100 UNIT/ML Solostar- inject 55 Units into the skin at bedtime. If fasting blood sugars remain >150 for 1 week, increase insulin dose by 5 units. Maximum of 80 units  One touch Ult  100CT Test strip   Patient declined medication  last month  Metformin HCI 500 MG Tablet Twice Daily-(patient states he not taking per instruction from provider)  Gabapentin 100 mg capsule-take 2 capsule by mouth 3 times daily(adequate supply)  Albuterol 0.83% 25CT SOLN PRN- Patient states he will get this from a company.  One Touch U-Soft Lancet 100-Adequate supply  Nitroglycerin 0.4MG SL Tablet PRN-Adequate Supply  Patient is due for next adherence delivery on: 01/30/2021. Called patient and reviewed medications and coordinated delivery.  This delivery to include:  Amlodipine 5MG Tablet Daily -Breakfast  Aripiprazole 7 mg daily   Buspirone 15 MGoneTablet Two Times Daily - Breakfast, Evening meals  Eliquis 5 MGOneTablets twice daily -Breakfast, Evening Meals  Fluoxetine 20 MG Capsule Three Capsules Daily  Furosemide 40 MgOneTablet Twice Daily - Breakfast, Evening Meals   Pantoprazole 40 MGOneTablet Daily -Breakfast  Rosuvastatin 20 mg daily  Albuterol 108 MCG/ACT inhalerPRN   Lubiprostone 8 MCGOneCapsule Twice Daily- Breakfast, Evening Meals  Hydralazine  25 mgTake one tablet 3 times daily  insulin glargine (LANTUS SOLOSTAR) 100 UNIT/ML Solostar- inject 55 Units into the skin at bedtime. If fasting blood sugars remain >150 for 1 week, increase insulin dose by 5 units. Maximum of 80 units  One touch Ult 100CT Test strip  Carvedilol 12.5 mg 1 tablet 2 times daily  Isosorbide mononitrate 30 mg 1 tablet daily.  Pen needles 31g      Patient declined the following medications   Metformin HCI 500 MG Tablet Twice Daily-(patient states he not taking per instruction from provider)  Gabapentin 100 mg capsule-take 2 capsule by mouth 3 times daily(adequate supply)  Albuterol 0.83% 25CT SOLN PRN- Patient states he will get this from a company.  Spironolactone place on hold until follow up with Cardiology  Potassium place on hold until  follow with Cardiology.  Nitroglycerin 0.4MG SL Tablet PRN-Adequate Supply  Patient needs refills for Fluoxetine,Buspirone,Lubiprostone,Aripiprazole, Isosorbide mononitrate,Carvedilol, Amlodipine,   .  Confirmed delivery date of 01/30/2021, advised patient that pharmacy will contact them the morning of delivery.  Blood pressure readings: Patient states his blood pressure ranges around 160/80-90.  Blood sugar readings: Patient states his blood sugars ranges from 126 to the highest of 300.  [place] Pharmacist Assistant [phone]

## 2021-01-22 NOTE — ED Notes (Signed)
Critical lab Trop 42

## 2021-01-22 NOTE — ED Provider Notes (Signed)
[facility] EMERGENCY DEPARTMENT Provider Note   CSN: [id number] Arrival date & time: 01/22/21  1724     History Chief Complaint  Patient presents with  . Respiratory Distress    Kenneth [name] is a 60 y.o. male.  HPI   Patient presents to the ED with complaints of shortness of breath.  Patient does have history of CHF as well as COPD.  He also has history of atrial fibrillation.  Patient states the symptoms started acutely this afternoon.  Patient states he tried his DuoNeb treatments at home and the symptoms worsened.  He had to call EMS.  When EMS found him he was diaphoretic and in severe respiratory distress.  He was hypoxic.  There is also noted him to be extremely hypertensive.  He was started on nitroglycerin as well as nebulizer treatment and CPAP.  Patient's initial blood pressure by EMS was 230/130.  His symptoms improved significantly with EMS treatment and they noted his blood pressure had decreased to around 160/110.  Patient states he is feeling much better now.  He is not having any chest pain.  Is not had any fevers or chills.  He has been compliant with his medications  Past Medical History:  Diagnosis Date  . Anxiety   . Arthritis   . Atrial fibrillation ([place])   . Chest tightness 06/12/2019  . CHF (congestive heart failure) ([place])   . COPD (chronic obstructive pulmonary disease) ([facility])    emphysema  . Depression   . Diabetes mellitus without complication ([place])   . Heart murmur   . Hyperlipidemia   . Hypertension   . Sleep apnea with use of continuous positive airway pressure (CPAP)     Patient Active Problem List   Diagnosis Date Noted  . Tinea pedis 01/15/2021  . Pes planus 01/15/2021  . Chronic respiratory failure with hypoxia ([place]) 12/07/2020  . Oral candidiasis 12/07/2020  . Acute on chronic respiratory failure with hypoxia ([place]) 10/23/2020  . Cardiac arrest ([place]) 10/04/2020  . Acute kidney injury superimposed on CKD ([place]) 09/23/2020  .  Thiamine deficiency 07/02/2020  . Acute on chronic respiratory failure ([place]) 06/20/2020  . Obstructive sleep apnea 05/02/2020  . Hyperlipidemia   . Chronic combined systolic and diastolic heart failure ([place]) 04/07/2020  . Chronic constipation 07/19/2019  . Snoring 03/29/2019  . Insomnia due to other mental disorder 11/15/2018  . Anticoagulant long-term use 10/21/2018  . Gastroesophageal reflux disease without esophagitis 08/10/2018  . Tobacco abuse 08/10/2018  . Chest pain 07/27/2018  . Essential hypertension 07/06/2018  . Chronic obstructive pulmonary disease ([place]) 07/06/2018  . Alcohol abuse 07/06/2018  . Paroxysmal atrial fibrillation ([place]) 11/28/2017  . Slow transit constipation 06/07/2016  . Chronic anemia 06/06/2016  . Hypertensive kidney disease with chronic kidney disease stage III ([place]) 06/06/2016  . Mixed anxiety and depressive disorder 06/05/2016  . Type 2 diabetes mellitus with diabetic neuropathy, with long-term current use of insulin ([place]) 06/05/2016  . Chronic obstructive pulmonary disease with (acute) exacerbation ([place]) 06/05/2016    Past Surgical History:  Procedure Laterality Date  . NO PAST SURGERIES         Family History  Problem Relation Age of Onset  . Diabetes Mother   . Heart disease Mother   . Diabetes Father   . Heart disease Father   . Diabetes Sister   . Heart disease Sister   . Kidney disease Sister   . Coronary artery disease Brother   . Liver disease Maternal  Uncle     Social History   Tobacco Use  . Smoking status: Former Smoker    Packs/day: 2.00    Years: 30.00    Pack years: 60.00    Types: Cigarettes    Quit date: 06/2020    Years since quitting: 0.5  . Smokeless tobacco: Never Used  . Tobacco comment: smokes 2-3cigs per day  Vaping Use  . Vaping Use: Never used  Substance Use Topics  . Alcohol use: Not Currently    Comment: pt stated that it was 6 months ago he drunk bourbon  . Drug use: Not Currently    Types:  Marijuana    Comment: occ marijuana, 6 months ago    Home Medications Prior to Admission medications   Medication Sig Start Date End Date Taking? Authorizing Provider  acetaminophen (TYLENOL) 325 MG tablet Take 650 mg by mouth every 6 (six) hours as needed for mild pain or headache.    [provider]  albuterol (PROVENTIL) (2.5 MG/3ML) 0.083% nebulizer solution Take 3 mLs (2.5 mg total) by nebulization every 6 (six) hours as needed for shortness of breath. 12/25/20   Aline August, MD  amLODipine (NORVASC) 5 MG tablet Take 5 mg by mouth every morning. 10/30/20   [provider]  ARIPiprazole (ABILIFY) 5 MG tablet Take 7 mg by mouth daily.    [provider]  Blood Glucose Monitoring Suppl (ONE TOUCH ULTRA 2) w/Device KIT Use to test blood sugars 1-2 times daily. 04/02/20   [name], MD  busPIRone (BUSPAR) 15 MG tablet Take 1 tablet (15 mg total) by mouth 2 (two) times daily. 09/06/19   [name], MD  carvedilol (COREG) 12.5 MG tablet Take 1 tablet (12.5 mg total) by mouth 2 (two) times daily with a meal. 04/23/20   Buford Dresser, MD  clotrimazole (MYCELEX) 10 MG troche Take 1 tablet (10 mg total) by mouth 5 (five) times daily. 12/07/20   Parrett, Fonnie Mu, NP  DM-GG & DM-APAP-CPM (CORICIDIN HBP DAY/NIGHT COLD) 10-20 &15-200-2 MG MISC Take 1 tablet by mouth every 12 (twelve) hours as needed (cold symptoms).    [provider]  ELIQUIS 5 MG TABS tablet TAKE ONE TABLET BY MOUTH EVERY MORNING and TAKE ONE TABLET BY MOUTH EVERY EVENING 10/22/20   Buford Dresser, MD  famotidine (PEPCID) 20 MG tablet Take 20 mg by mouth daily.    [provider]  FLUoxetine (PROZAC) 20 MG capsule TAKE 3 CAPSULES BY MOUTH  DAILY 05/26/19   [name], MD  Fluticasone-Umeclidin-Vilant (TRELEGY ELLIPTA) 100-62.5-25 MCG/INH AEPB Inhale 1 puff into the lungs daily. 04/02/20   Collene Gobble, MD  furosemide (LASIX) 40 MG tablet Take 1  tablet (40 mg total) by mouth 2 (two) times daily. 08/02/20   Buford Dresser, MD  gabapentin (NEURONTIN) 100 MG capsule Take 2 capsules (200 mg total) by mouth 3 (three) times daily. 11/22/20   [name], MD  glucose blood [facility] ULTRA) test strip USE TO TEST BLOOD SUGAR 1-2 TIMES DAILY 05/22/20   [name], MD  guaiFENesin (MUCINEX) 600 MG 12 hr tablet Take 1 tablet (600 mg total) by mouth 2 (two) times daily. 09/24/20   Regalado, Belkys A, MD  hydrALAZINE (APRESOLINE) 25 MG tablet Take 1 tablet (25 mg total) by mouth 3 (three) times daily. 11/23/20 05/22/21  [name], MD  insulin glargine (LANTUS SOLOSTAR) 100 UNIT/ML Solostar Pen Inject 55 Units into the skin at bedtime. If fasting  blood sugars remain >150 for 1 week, increase insulin dose by 5 units. Maximum of 80 units. 12/19/20   [name], MD  Insulin Pen Needle (PEN NEEDLES) 31G X 6 MM MISC 1 application by Does not apply route at bedtime. 11/03/20   Nche, Charlene Brooke, NP  ipratropium-albuterol (DUONEB) 0.5-2.5 (3) MG/3ML SOLN Take 3 mLs by nebulization every 6 (six) hours as needed. Patient taking differently: Take 3 mLs by nebulization every 6 (six) hours as needed (For shortness of breath). 09/24/20   Regalado, Belkys A, MD  isosorbide mononitrate (IMDUR) 30 MG 24 hr tablet Take 30 mg by mouth daily. 07/04/20   [provider]  ketoconazole (NIZORAL) 2 % cream Apply 1 application topically daily. 01/15/21   Criselda Peaches, DPM  Lancets [facility] ULTRASOFT) lancets USE TO TEST BLOOD SUGAR 1-2 TIMES DAILY 05/22/20   [name], MD  lubiprostone [facility]) 8 MCG capsule Take 1 capsule (8 mcg total) by mouth 2 (two) times daily with a meal. 12/21/20   [name], MD  Multiple Vitamin (MULTIVITAMIN WITH MINERALS) TABS tablet Take 1 tablet by mouth daily.    [provider]  nitroGLYCERIN (NITROSTAT) 0.4 MG SL tablet DISSOLVE 1 TABLET UNDER THE TONGUE EVERY 5 MINUTES AS  NEEDED FOR  CHEST PAIN. MAX  OF 3 TABLETS IN 15 MINUTES. CALL 911 IF PAIN PERSISTS. Patient taking differently: Place 0.4 mg under the tongue every 5 (five) minutes as needed for chest pain. 07/25/20   [name], MD  nystatin (MYCOSTATIN) 100000 UNIT/ML suspension Take 5 mLs (500,000 Units total) by mouth 4 (four) times daily. 12/07/20   Parrett, Fonnie Mu, NP  pantoprazole (PROTONIX) 40 MG tablet TAKE ONE TABLET BY MOUTH EVERY MORNING 10/29/20   Esterwood, Amy S, PA-C  predniSONE (DELTASONE) 10 MG tablet TAKE 4 TABLETS (40 MG TOTAL) BY MOUTH DAILY WITH BREAKFAST FOR 3 DAYS. 06/22/20 06/22/21  Amin, Jeanella Flattery, MD  Roflumilast (DALIRESP) 250 MCG TABS Take 250 mcg by mouth daily. 11/06/20   Parrett, Fonnie Mu, NP  rosuvastatin (CRESTOR) 20 MG tablet TAKE ONE TABLET BY MOUTH ONCE DAILY 10/29/20   Dutch Quint B, FNP    Allergies    Lisinopril, Other, and Tomato  Review of Systems   Review of Systems  All other systems reviewed and are negative.   Physical Exam Updated Vital Signs BP 131/70   Pulse 89   Temp 97.6 F (36.4 C) (Temporal)   Resp 18   Ht 1.829 m (6')   Wt 90 kg   SpO2 100%   BMI 26.91 kg/m   Physical Exam Vitals and nursing note reviewed.  Constitutional:      Appearance: He is well-developed. He is not diaphoretic.  HENT:     Head: Normocephalic and atraumatic.     Right Ear: External ear normal.     Left Ear: External ear normal.  Eyes:     General: No scleral icterus.       Right eye: No discharge.        Left eye: No discharge.     Conjunctiva/sclera: Conjunctivae normal.  Neck:     Trachea: No tracheal deviation.  Cardiovascular:     Rate and Rhythm: Normal rate and regular rhythm.  Pulmonary:     Effort: Pulmonary effort is normal. No respiratory distress.     Breath sounds: Normal breath sounds. No stridor. No wheezing or rales.     Comments: Reading comfortably on CPAP Abdominal:  General: Bowel sounds are normal. There is no distension.      Palpations: Abdomen is soft.     Tenderness: There is no abdominal tenderness. There is no guarding or rebound.  Musculoskeletal:        General: No tenderness.     Cervical back: Neck supple.  Skin:    General: Skin is warm and dry.     Findings: No rash.  Neurological:     Mental Status: He is alert.     Cranial Nerves: No cranial nerve deficit (no facial droop, extraocular movements intact, no slurred speech).     Sensory: No sensory deficit.     Motor: No abnormal muscle tone or seizure activity.     Coordination: Coordination normal.     ED Results / Procedures / Treatments   Labs (all labs ordered are listed, but only abnormal results are displayed) Labs Reviewed  BASIC METABOLIC PANEL - Abnormal; Notable for the following components:      Result Value   Potassium 3.1 (*)    Chloride 95 (*)    CO2 34 (*)    Glucose, Bld 146 (*)    Creatinine, Ser 1.42 (*)    Calcium 8.7 (*)    GFR, Estimated 57 (*)    All other components within normal limits  CBC - Abnormal; Notable for the following components:   RBC 3.74 (*)    Hemoglobin 10.2 (*)    HCT 34.7 (*)    MCHC 29.4 (*)    All other components within normal limits  BRAIN NATRIURETIC PEPTIDE - Abnormal; Notable for the following components:   B Natriuretic Peptide 744.3 (*)    All other components within normal limits  TROPONIN I (HIGH SENSITIVITY) - Abnormal; Notable for the following components:   Troponin I (High Sensitivity) 19 (*)    All other components within normal limits  TROPONIN I (HIGH SENSITIVITY) - Abnormal; Notable for the following components:   Troponin I (High Sensitivity) 42 (*)    All other components within normal limits  RESP PANEL BY RT-PCR (FLU A&B, COVID) [id number]  D-DIMER, QUANTITATIVE    EKG EKG Interpretation  Date/Time:  Tuesday Jan 22 2021 17:32:00 EDT Ventricular Rate:  99 PR Interval:  191 QRS Duration: 82 QT Interval:  354 QTC Calculation: 455 R Axis:   67 Text  Interpretation: Atrial flutter with predominant 3:1 AV block Probable LVH with secondary repol abnrm Confirmed by Dorie Rank [phone]) on 01/22/2021 5:57:11 PM   Radiology DG Chest Port 1 View  Result Date: 01/22/2021 CLINICAL DATA:  Dyspnea EXAM: PORTABLE CHEST 1 VIEW COMPARISON:  12/23/2020, CT 10/03/2020 FINDINGS: Lungs are clear. No pneumothorax or pleural effusion. Multiple nodules noted over the right apex represent callus related to multiple anterior right rib fractures noted on prior CT examination of 10/03/2020. Cardiac size is within normal limits. Pulmonary vascularity is normal. No acute bone abnormality. IMPRESSION: No active disease. Electronically Signed   By: Fidela Salisbury MD   On: 01/22/2021 18:04    Procedures Procedures   Medications Ordered in ED Medications  methylPREDNISolone sodium succinate (SOLU-MEDROL) 125 mg/2 mL injection 125 mg (has no administration in time range)  furosemide (LASIX) injection 40 mg (40 mg Intravenous Given 01/22/21 2030)  albuterol (PROVENTIL) (2.5 MG/3ML) 0.083% nebulizer solution 5 mg (5 mg Nebulization Given 01/22/21 2118)  ipratropium (ATROVENT) nebulizer solution 0.5 mg (0.5 mg Nebulization Given 01/22/21 2118)    ED Course  I have reviewed the triage vital signs  and the nursing notes.  Pertinent labs & imaging results that were available during my care of the patient were reviewed by me and considered in my medical decision making (see chart for details).  Clinical Course as of 01/22/21 2203  Tue Jan 22, 2021  2001 Initial troponin elevated at 19, similar to previous values [JK]  2001 CBC shows a stable anemia [JK]  2001 BNP is elevated 744 [JK]  2002 Covid and flu are negative [JK]  2002 Chest x-ray without acute findings [JK]  2032 Patient no longer on BiPAP.  Breathing has improved.  Still some wheezing noted on exam [JK]  2145 Patient tried to walk to the bathroom but started to become more short of breath again. [JK]    Clinical  Course User Index [JK] Dorie Rank, MD   MDM Rules/Calculators/A&P                          Patient presented to the ED for evaluation of shortness of breath.  Patient had sudden onset this evening at home.  He tried several DuoNeb's without relief.  When EMS arrived patient was in respiratory distress he was notably hypertensive and hypoxic.  Patient was treated with nitroglycerin as well as nebulizer treatments.  In the ED patient did improve significantly.  He continued to have some wheezing.  Laboratory tests do show elevation in his BNP.  He also has coronary syndrome.  COVID and flu test are negative.  Chest x-ray is without pneumonia.  D-dimer is negative arguing against pulmonary embolism.  Suspect his symptoms are related to a combination of COPD and CHF.  Patient was treated with Lasix as well as breathing treatments and Solu-Medrol.  He continues to have some dyspnea and when he tried to walk his symptoms increased.  I will consult the medical service for admission Final Clinical Impression(s) / ED Diagnoses Final diagnoses:  COPD exacerbation ([place])  Acute on chronic congestive heart failure, unspecified heart failure type [facility])      Dorie Rank, MD 01/22/21 2204

## 2021-01-22 NOTE — ED Notes (Signed)
RT at bedside, pt off BIPAP

## 2021-01-22 NOTE — H&P (Signed)
History and Physical    Kenneth [name] YKZ:[id number] DOB: 1961/01/19 DOA: 01/22/2021  PCP: [name], MD  Patient coming from: Home via EMS  I have personally briefly reviewed patient's old medical records in [place]  Chief Complaint: Dyspnea  HPI: Kenneth [name] is a 60 y.o. male with medical history significant for COPD with asthma on chronic prednisone 10 mg daily, chronic respiratory failure with hypoxia on 3 L supplemental O2 via [place] at all times, chronic combined systolic and diastolic CHF (EF recovered to 60-65%), paroxysmal atrial fibrillation on Eliquis, CKD stage IIIa, insulin-dependent type 2 diabetes, hypertension, hyperlipidemia, depression/anxiety, and OSA on CPAP who presented to the ED for evaluation of dyspnea.  Patient states that he he developed sudden onset progressively worsening dyspnea while he was at home.  He says he was not significantly exerting himself but did take his dog outside a couple times.  He tried his home nebulizers without significant relief.  He has been having associated nonproductive cough.  He has had some chest tightness but denies any chest pressure.  He denies any subjective fevers, chills, diaphoresis, palpitations, abdominal pain, nausea, vomiting, dysuria, or lower extremity edema.  ED Course:  Initial vitals showed BP 143/81, pulse 104, RR 19, temp 97.6 F, SPO2 80% on room air, 100% while on BiPAP with 100% FiO2, subsequently weaned to 3 L supplemental O2 via [place] with O2 sats maintaining 99-100%.  Labs show WBC 8.6, hemoglobin 10.2, platelets 321,000, sodium 138, potassium 3.1, bicarb 34, BUN 13, creatinine 1.42, serum glucose 146, BNP 744.3, high-sensitivity troponin I 19 > 42, D-dimer 0.41.  Portable chest x-ray negative for focal consolidation, edema, effusion.  Patient was given IV Lasix 40 mg, Atrovent/albuterol nebulizer treatments, and IV Solu-Medrol 125 mg.  The hospitalist service was consulted to admit for further  evaluation and management.  Review of Systems: All systems reviewed and are negative except as documented in history of present illness above.   Past Medical History:  Diagnosis Date  . Anxiety   . Arthritis   . Atrial fibrillation ([place])   . Chest tightness 06/12/2019  . CHF (congestive heart failure) ([place])   . COPD (chronic obstructive pulmonary disease) (HCC)    emphysema  . Depression   . Diabetes mellitus without complication ([place])   . Heart murmur   . Hyperlipidemia   . Hypertension   . Sleep apnea with use of continuous positive airway pressure (CPAP)     Past Surgical History:  Procedure Laterality Date  . NO PAST SURGERIES      Social History:  reports that he quit smoking about 7 months ago. His smoking use included cigarettes. He has a 60.00 pack-year smoking history. He has never used smokeless tobacco. He reports previous alcohol use. He reports previous drug use. Drug: Marijuana.  Allergies  Allergen Reactions  . Lisinopril Swelling  . Other Other (See Comments)    Lettuce : rash  . Tomato Rash    Family History  Problem Relation Age of Onset  . Diabetes Mother   . Heart disease Mother   . Diabetes Father   . Heart disease Father   . Diabetes Sister   . Heart disease Sister   . Kidney disease Sister   . Coronary artery disease Brother   . Liver disease Maternal Uncle      Prior to Admission medications   Medication Sig Start Date End Date Taking? Authorizing Provider  acetaminophen (TYLENOL) 325 MG tablet Take 650 mg by  mouth every 6 (six) hours as needed for mild pain or headache.    [provider]  albuterol (PROVENTIL) (2.5 MG/3ML) 0.083% nebulizer solution Take 3 mLs (2.5 mg total) by nebulization every 6 (six) hours as needed for shortness of breath. 12/25/20   Aline August, MD  amLODipine (NORVASC) 5 MG tablet Take 5 mg by mouth every morning. 10/30/20   [provider]  ARIPiprazole (ABILIFY) 5 MG tablet Take 7 mg by mouth  daily.    [provider]  Blood Glucose Monitoring Suppl (ONE TOUCH ULTRA 2) w/Device KIT Use to test blood sugars 1-2 times daily. 04/02/20   [name], MD  busPIRone (BUSPAR) 15 MG tablet Take 1 tablet (15 mg total) by mouth 2 (two) times daily. 09/06/19   [name], MD  carvedilol (COREG) 12.5 MG tablet Take 1 tablet (12.5 mg total) by mouth 2 (two) times daily with a meal. 04/23/20   Buford Dresser, MD  clotrimazole (MYCELEX) 10 MG troche Take 1 tablet (10 mg total) by mouth 5 (five) times daily. 12/07/20   Parrett, Fonnie Mu, NP  DM-GG & DM-APAP-CPM (CORICIDIN HBP DAY/NIGHT COLD) 10-20 &15-200-2 MG MISC Take 1 tablet by mouth every 12 (twelve) hours as needed (cold symptoms).    [provider]  ELIQUIS 5 MG TABS tablet TAKE ONE TABLET BY MOUTH EVERY MORNING and TAKE ONE TABLET BY MOUTH EVERY EVENING 10/22/20   Buford Dresser, MD  famotidine (PEPCID) 20 MG tablet Take 20 mg by mouth daily.    [provider]  FLUoxetine (PROZAC) 20 MG capsule TAKE 3 CAPSULES BY MOUTH  DAILY 05/26/19   [name], MD  Fluticasone-Umeclidin-Vilant (TRELEGY ELLIPTA) 100-62.5-25 MCG/INH AEPB Inhale 1 puff into the lungs daily. 04/02/20   Collene Gobble, MD  furosemide (LASIX) 40 MG tablet Take 1 tablet (40 mg total) by mouth 2 (two) times daily. 08/02/20   Buford Dresser, MD  gabapentin (NEURONTIN) 100 MG capsule Take 2 capsules (200 mg total) by mouth 3 (three) times daily. 11/22/20   [name], MD  glucose blood [facility] ULTRA) test strip USE TO TEST BLOOD SUGAR 1-2 TIMES DAILY 05/22/20   [name], MD  guaiFENesin (MUCINEX) 600 MG 12 hr tablet Take 1 tablet (600 mg total) by mouth 2 (two) times daily. 09/24/20   Regalado, Belkys A, MD  hydrALAZINE (APRESOLINE) 25 MG tablet Take 1 tablet (25 mg total) by mouth 3 (three) times daily. 11/23/20 05/22/21  [name], MD  insulin glargine (LANTUS SOLOSTAR) 100 UNIT/ML  Solostar Pen Inject 55 Units into the skin at bedtime. If fasting blood sugars remain >150 for 1 week, increase insulin dose by 5 units. Maximum of 80 units. 12/19/20   [name], MD  Insulin Pen Needle (PEN NEEDLES) 31G X 6 MM MISC 1 application by Does not apply route at bedtime. 11/03/20   Nche, Charlene Brooke, NP  ipratropium-albuterol (DUONEB) 0.5-2.5 (3) MG/3ML SOLN Take 3 mLs by nebulization every 6 (six) hours as needed. Patient taking differently: Take 3 mLs by nebulization every 6 (six) hours as needed (For shortness of breath). 09/24/20   Regalado, Belkys A, MD  isosorbide mononitrate (IMDUR) 30 MG 24 hr tablet Take 30 mg by mouth daily. 07/04/20   [provider]  ketoconazole (NIZORAL) 2 % cream Apply 1 application topically daily. 01/15/21   McDonald, Stephan Minister, DPM  Lancets (ONETOUCH ULTRASOFT) lancets USE TO TEST BLOOD SUGAR 1-2 TIMES DAILY 05/22/20  [name], MD  lubiprostone [facility]) 8 MCG capsule Take 1 capsule (8 mcg total) by mouth 2 (two) times daily with a meal. 12/21/20   [name], MD  Multiple Vitamin (MULTIVITAMIN WITH MINERALS) TABS tablet Take 1 tablet by mouth daily.    [provider]  nitroGLYCERIN (NITROSTAT) 0.4 MG SL tablet DISSOLVE 1 TABLET UNDER THE TONGUE EVERY 5 MINUTES AS&nbsp;&nbsp;NEEDED FOR CHEST PAIN. MAX&nbsp;&nbsp;OF 3 TABLETS IN 15 MINUTES. CALL 911 IF PAIN PERSISTS. Patient taking differently: Place 0.4 mg under the tongue every 5 (five) minutes as needed for chest pain. 07/25/20   [name], MD  nystatin (MYCOSTATIN) 100000 UNIT/ML suspension Take 5 mLs (500,000 Units total) by mouth 4 (four) times daily. 12/07/20   Parrett, Fonnie Mu, NP  pantoprazole (PROTONIX) 40 MG tablet TAKE ONE TABLET BY MOUTH EVERY MORNING 10/29/20   Esterwood, Amy S, PA-C  predniSONE (DELTASONE) 10 MG tablet TAKE 4 TABLETS (40 MG TOTAL) BY MOUTH DAILY WITH BREAKFAST FOR 3 DAYS. 06/22/20 06/22/21  Amin, Jeanella Flattery, MD  Roflumilast  (DALIRESP) 250 MCG TABS Take 250 mcg by mouth daily. 11/06/20   Parrett, Fonnie Mu, NP  rosuvastatin (CRESTOR) 20 MG tablet TAKE ONE TABLET BY MOUTH ONCE DAILY 10/29/20   Dutch Quint B, FNP    Physical Exam: Vitals:   01/22/21 1930 01/22/21 2000 01/22/21 2045 01/22/21 2118  BP: (!) 149/86 (!) 147/77 131/70   Pulse: 84 89 89   Resp: _0 Temp:      TempSrc:      SpO2: 100% 100% 99% 100%  Weight:      Height:       Constitutional: Sitting up on the side of the bed, NAD, calm, comfortable Eyes: PERRL, lids and conjunctivae normal ENMT: Mucous membranes are moist. Posterior pharynx clear of any exudate or lesions.Normal dentition.  Neck: normal, supple, no masses. Respiratory: Distant breath sounds with faint end expiratory wheezing.  Normal respiratory effort. No accessory muscle use.  Cardiovascular: Irregularly irregular, no murmurs / rubs / gallops. No extremity edema. 2+ pedal pulses. Abdomen: no tenderness, no masses palpated. No hepatosplenomegaly. Bowel sounds positive.  Musculoskeletal: no clubbing / cyanosis. No joint deformity upper and lower extremities. Good ROM, no contractures. Normal muscle tone.  Skin: no rashes, lesions, ulcers. No induration Neurologic: CN 2-12 grossly intact. Sensation intact. Strength 5/5 in all 4.  Psychiatric: Normal judgment and insight. Alert and oriented x 3. Normal mood.   Labs on Admission: I have personally reviewed following labs and imaging studies  CBC: Recent Labs  Lab 01/22/21 1739  WBC 8.6  HGB 10.2*  HCT 34.7*  MCV 92.8  PLT 161   Basic Metabolic Panel: Recent Labs  Lab 01/22/21 1739  NA 138  K 3.1*  CL 95*  CO2 34*  GLUCOSE 146*  BUN 13  CREATININE 1.42*  CALCIUM 8.7*   GFR: Estimated Creatinine Clearance: 61.5 mL/min (A) (by C-G formula based on SCr of 1.42 mg/dL (H)). Liver Function Tests: No results for input(s): AST, ALT, ALKPHOS, BILITOT, PROT, ALBUMIN in the last 168 hours. No results for input(s):  LIPASE, AMYLASE in the last 168 hours. No results for input(s): AMMONIA in the last 168 hours. Coagulation Profile: No results for input(s): INR, PROTIME in the last 168 hours. Cardiac Enzymes: No results for input(s): CKTOTAL, CKMB, CKMBINDEX, TROPONINI in the last 168 hours. BNP (last 3 results) No results for input(s): PROBNP in the last 8760 hours. HbA1C: No results for  input(s): HGBA1C in the last 72 hours. CBG: No results for input(s): GLUCAP in the last 168 hours. Lipid Profile: No results for input(s): CHOL, HDL, LDLCALC, TRIG, CHOLHDL, LDLDIRECT in the last 72 hours. Thyroid Function Tests: No results for input(s): TSH, T4TOTAL, FREET4, T3FREE, THYROIDAB in the last 72 hours. Anemia Panel: No results for input(s): VITAMINB12, FOLATE, FERRITIN, TIBC, IRON, RETICCTPCT in the last 72 hours. Urine analysis:    Component Value Date/Time   COLORURINE AMBER (A) 10/03/2020 2036   APPEARANCEUR CLOUDY (A) 10/03/2020 2036   LABSPEC 1.016 10/03/2020 2036   PHURINE 6.0 10/03/2020 2036   GLUCOSEU >=500 (A) 10/03/2020 2036   GLUCOSEU NEGATIVE 05/20/2019 0825   HGBUR NEGATIVE 10/03/2020 2036   BILIRUBINUR NEGATIVE 10/03/2020 2036   KETONESUR NEGATIVE 10/03/2020 2036   PROTEINUR >=300 (A) 10/03/2020 2036   UROBILINOGEN 1.0 05/20/2019 0825   NITRITE NEGATIVE 10/03/2020 2036   LEUKOCYTESUR NEGATIVE 10/03/2020 2036    Radiological Exams on Admission: DG Chest Port 1 View  Result Date: 01/22/2021 CLINICAL DATA:  Dyspnea EXAM: PORTABLE CHEST 1 VIEW COMPARISON:  12/23/2020, CT 10/03/2020 FINDINGS: Lungs are clear. No pneumothorax or pleural effusion. Multiple nodules noted over the right apex represent callus related to multiple anterior right rib fractures noted on prior CT examination of 10/03/2020. Cardiac size is within normal limits. Pulmonary vascularity is normal. No acute bone abnormality. IMPRESSION: No active disease. Electronically Signed   By: Fidela Salisbury MD   On: 01/22/2021  18:04    EKG: Personally reviewed. Atrial fibrillation with rate 99.  Previous EKG showed sinus rhythm.  Assessment/Plan Principal Problem:   Acute on chronic respiratory failure with hypoxia (HCC) Active Problems:   Hypertension associated with diabetes ([place])   Type 2 diabetes mellitus with diabetic neuropathy, with long-term current use of insulin (HCC)   Obstructive sleep apnea   Chronic obstructive pulmonary disease with (acute) exacerbation (HCC)   Hypertensive kidney disease with chronic kidney disease stage III (HCC)   Acute on chronic combined systolic and diastolic CHF (congestive heart failure) (HCC)   Hyperlipidemia associated with type 2 diabetes mellitus ([place])   Hypokalemia   Kenneth [name] is a 60 y.o. male with medical history significant for COPD with asthma on chronic prednisone 10 mg daily, chronic respiratory failure with hypoxia on 3 L supplemental O2 via [place] at all times, chronic combined systolic and diastolic CHF (EF recovered to 60-65%), paroxysmal atrial fibrillation on Eliquis, CKD stage IIIa, insulin-dependent type 2 diabetes, hypertension, hyperlipidemia, depression/anxiety, and OSA on CPAP who is admitted for acute on chronic respiratory failure with hypoxia.  Acute on chronic respiratory failure with hypoxia secondary to acute COPD exacerbation: Initially requiring BiPAP, now back on home 3 L O2 via [place].  Still with some and expiratory wheezing and dyspnea on exertion. -Continue IV Solu-Medrol 40 mg twice daily -Continue scheduled DuoNebs, as needed albuterol -Trelegy, Roflumilast, Mucinex -Continue supplemental O2 -Ambulatory desat eval in a.m.  Acute on chronic combined systolic and diastolic CHF: Improving after receiving IV Lasix 40 mg once in the ED.  Resume home meds tomorrow. -Continue home Lasix 40 mg twice daily -Continue Coreg, spironolactone, Imdur  Paroxysmal atrial fibrillation: Continue Eliquis and Coreg.  Hypokalemia: Supplement, check  magnesium and supplement if needed.  Insulin-dependent type 2 diabetes: Continue reduced home Lantus 35 units nightly, add sensitive SSI.  CKD stage IIIa: Chronic and stable.  Hypertension: Resume home amlodipine, Coreg, hydralazine, Imdur, spironolactone.  Hyperlipidemia: Continue rosuvastatin.  Depression/anxiety: Continue Abilify, BuSpar, Prozac.  OSA: Continue  CPAP nightly.  DVT prophylaxis: Eliquis Code Status: Full code, confirmed with patient Family Communication: Discussed with patient, he has discussed with family Disposition Plan: From home and likely discharge to home pending clinical progress Consults called: None Level of care: Telemetry Cardiac Admission status:  Status is: Observation  The patient remains OBS appropriate and will d/c before 2 midnights.  Dispo: The patient is from: Home              Anticipated d/c is to: Home              Patient currently is not medically stable to d/c.   Difficult to place patient No  Zada Finders MD [facility]  If 7PM-7AM, please contact night-coverage [email or web address]  01/22/2021, 10:28 PM

## 2021-01-22 NOTE — ED Notes (Signed)
Knapp MD made aware of critical troponin 

## 2021-01-23 ENCOUNTER — Encounter (HOSPITAL_COMMUNITY): Payer: Self-pay | Admitting: Internal Medicine

## 2021-01-23 ENCOUNTER — Other Ambulatory Visit (HOSPITAL_COMMUNITY): Payer: Self-pay

## 2021-01-23 DIAGNOSIS — J9621 Acute and chronic respiratory failure with hypoxia: Secondary | ICD-10-CM | POA: Diagnosis not present

## 2021-01-23 DIAGNOSIS — F419 Anxiety disorder, unspecified: Secondary | ICD-10-CM | POA: Diagnosis present

## 2021-01-23 DIAGNOSIS — N1831 Chronic kidney disease, stage 3a: Secondary | ICD-10-CM | POA: Diagnosis present

## 2021-01-23 DIAGNOSIS — E1169 Type 2 diabetes mellitus with other specified complication: Secondary | ICD-10-CM | POA: Diagnosis present

## 2021-01-23 DIAGNOSIS — I5043 Acute on chronic combined systolic (congestive) and diastolic (congestive) heart failure: Secondary | ICD-10-CM | POA: Diagnosis present

## 2021-01-23 DIAGNOSIS — M1990 Unspecified osteoarthritis, unspecified site: Secondary | ICD-10-CM | POA: Diagnosis present

## 2021-01-23 DIAGNOSIS — J441 Chronic obstructive pulmonary disease with (acute) exacerbation: Secondary | ICD-10-CM | POA: Diagnosis present

## 2021-01-23 DIAGNOSIS — I1310 Hypertensive heart and chronic kidney disease without heart failure, with stage 1 through stage 4 chronic kidney disease, or unspecified chronic kidney disease: Secondary | ICD-10-CM | POA: Diagnosis present

## 2021-01-23 DIAGNOSIS — I152 Hypertension secondary to endocrine disorders: Secondary | ICD-10-CM | POA: Diagnosis present

## 2021-01-23 DIAGNOSIS — Z79899 Other long term (current) drug therapy: Secondary | ICD-10-CM | POA: Diagnosis not present

## 2021-01-23 DIAGNOSIS — Z7901 Long term (current) use of anticoagulants: Secondary | ICD-10-CM | POA: Diagnosis not present

## 2021-01-23 DIAGNOSIS — I248 Other forms of acute ischemic heart disease: Secondary | ICD-10-CM | POA: Diagnosis present

## 2021-01-23 DIAGNOSIS — Z888 Allergy status to other drugs, medicaments and biological substances status: Secondary | ICD-10-CM | POA: Diagnosis not present

## 2021-01-23 DIAGNOSIS — E1140 Type 2 diabetes mellitus with diabetic neuropathy, unspecified: Secondary | ICD-10-CM | POA: Diagnosis present

## 2021-01-23 DIAGNOSIS — E876 Hypokalemia: Secondary | ICD-10-CM | POA: Diagnosis present

## 2021-01-23 DIAGNOSIS — Z7952 Long term (current) use of systemic steroids: Secondary | ICD-10-CM | POA: Diagnosis not present

## 2021-01-23 DIAGNOSIS — Z20822 Contact with and (suspected) exposure to covid-19: Secondary | ICD-10-CM | POA: Diagnosis present

## 2021-01-23 DIAGNOSIS — E785 Hyperlipidemia, unspecified: Secondary | ICD-10-CM | POA: Diagnosis present

## 2021-01-23 DIAGNOSIS — G4733 Obstructive sleep apnea (adult) (pediatric): Secondary | ICD-10-CM | POA: Diagnosis present

## 2021-01-23 DIAGNOSIS — Z794 Long term (current) use of insulin: Secondary | ICD-10-CM | POA: Diagnosis not present

## 2021-01-23 DIAGNOSIS — I480 Paroxysmal atrial fibrillation: Secondary | ICD-10-CM | POA: Diagnosis present

## 2021-01-23 DIAGNOSIS — Z87891 Personal history of nicotine dependence: Secondary | ICD-10-CM | POA: Diagnosis not present

## 2021-01-23 DIAGNOSIS — E1122 Type 2 diabetes mellitus with diabetic chronic kidney disease: Secondary | ICD-10-CM | POA: Diagnosis present

## 2021-01-23 DIAGNOSIS — F32A Depression, unspecified: Secondary | ICD-10-CM | POA: Diagnosis present

## 2021-01-23 DIAGNOSIS — J439 Emphysema, unspecified: Secondary | ICD-10-CM | POA: Diagnosis present

## 2021-01-23 LAB — CBC
HCT: 34.9 % — ABNORMAL LOW (ref 39.0–52.0)
Hemoglobin: 10.3 g/dL — ABNORMAL LOW (ref 13.0–17.0)
MCH: 27 pg (ref 26.0–34.0)
MCHC: 29.5 g/dL — ABNORMAL LOW (ref 30.0–36.0)
MCV: 91.4 fL (ref 80.0–100.0)
Platelets: 294 10*3/uL (ref 150–400)
RBC: 3.82 MIL/uL — ABNORMAL LOW (ref 4.22–5.81)
RDW: 13.1 % (ref 11.5–15.5)
WBC: 8.1 10*3/uL (ref 4.0–10.5)
nRBC: 0 % (ref 0.0–0.2)

## 2021-01-23 LAB — BASIC METABOLIC PANEL
Anion gap: 9 (ref 5–15)
BUN: 13 mg/dL (ref 6–20)
CO2: 37 mmol/L — ABNORMAL HIGH (ref 22–32)
Calcium: 9.3 mg/dL (ref 8.9–10.3)
Chloride: 91 mmol/L — ABNORMAL LOW (ref 98–111)
Creatinine, Ser: 1.26 mg/dL — ABNORMAL HIGH (ref 0.61–1.24)
GFR, Estimated: >60 mL/min (ref >60)
Glucose, Bld: 325 mg/dL — ABNORMAL HIGH (ref 70–99)
Potassium: 3.6 mmol/L (ref 3.5–5.1)
Sodium: 137 mmol/L (ref 135–145)

## 2021-01-23 LAB — MAGNESIUM: Magnesium: 1.9 mg/dL (ref 1.7–2.4)

## 2021-01-23 LAB — TROPONIN I (HIGH SENSITIVITY): Troponin I (High Sensitivity): 31 ng/L — ABNORMAL HIGH (ref <18)

## 2021-01-23 LAB — GLUCOSE, CAPILLARY
Glucose-Capillary: 158 mg/dL — ABNORMAL HIGH (ref 70–99)
Glucose-Capillary: 186 mg/dL — ABNORMAL HIGH (ref 70–99)
Glucose-Capillary: 218 mg/dL — ABNORMAL HIGH (ref 70–99)
Glucose-Capillary: 254 mg/dL — ABNORMAL HIGH (ref 70–99)
Glucose-Capillary: 315 mg/dL — ABNORMAL HIGH (ref 70–99)

## 2021-01-23 MED ORDER — INSULIN ASPART 100 UNIT/ML IJ SOLN
0.0000 [IU] | Freq: Three times a day (TID) | INTRAMUSCULAR | Status: DC
  Administered 2021-01-23: 5 [IU] via SUBCUTANEOUS
  Administered 2021-01-23: 8 [IU] via SUBCUTANEOUS
  Administered 2021-01-24: 3 [IU] via SUBCUTANEOUS

## 2021-01-23 MED ORDER — HYDRALAZINE HCL 50 MG PO TABS
50.0000 mg | ORAL_TABLET | Freq: Three times a day (TID) | ORAL | Status: DC
  Administered 2021-01-23 – 2021-01-24 (×3): 50 mg via ORAL
  Filled 2021-01-23 (×3): qty 1

## 2021-01-23 MED ORDER — ALUM & MAG HYDROXIDE-SIMETH 200-200-20 MG/5ML PO SUSP
15.0000 mL | ORAL | Status: DC | PRN
  Administered 2021-01-23: 15 mL via ORAL
  Filled 2021-01-23: qty 30

## 2021-01-23 MED ORDER — FLUTICASONE FUROATE-VILANTEROL 100-25 MCG/INH IN AEPB
1.0000 | INHALATION_SPRAY | Freq: Every day | RESPIRATORY_TRACT | Status: DC
  Administered 2021-01-23 – 2021-01-24 (×2): 1 via RESPIRATORY_TRACT
  Filled 2021-01-23: qty 28

## 2021-01-23 MED ORDER — UMECLIDINIUM BROMIDE 62.5 MCG/INH IN AEPB
1.0000 | INHALATION_SPRAY | Freq: Every day | RESPIRATORY_TRACT | Status: DC
  Administered 2021-01-23 – 2021-01-24 (×2): 1 via RESPIRATORY_TRACT
  Filled 2021-01-23: qty 7

## 2021-01-23 MED ORDER — LIVING WELL WITH DIABETES BOOK
Freq: Once | Status: AC
  Filled 2021-01-23: qty 1

## 2021-01-23 MED ORDER — INSULIN GLARGINE 100 UNIT/ML ~~LOC~~ SOLN
55.0000 [IU] | Freq: Every day | SUBCUTANEOUS | Status: DC
  Administered 2021-01-23: 55 [IU] via SUBCUTANEOUS
  Filled 2021-01-23 (×2): qty 0.55

## 2021-01-23 MED ORDER — INSULIN ASPART 100 UNIT/ML IJ SOLN: 0.0000 [IU] | Freq: Every day | INTRAMUSCULAR | Status: DC

## 2021-01-23 MED ORDER — IPRATROPIUM-ALBUTEROL 0.5-2.5 (3) MG/3ML IN SOLN
3.0000 mL | Freq: Two times a day (BID) | RESPIRATORY_TRACT | Status: DC
  Administered 2021-01-23 – 2021-01-24 (×2): 3 mL via RESPIRATORY_TRACT
  Filled 2021-01-23 (×2): qty 3

## 2021-01-23 NOTE — Progress Notes (Signed)
Heart Failure Stewardship Pharmacist Progress Note   PCP: [name], MD PCP-Cardiologist: None    HPI:  60 YO male with PMH significant for COPD, chronic respiratory failure requiring 3L supplemental O2, chronic combined systolic and diastolic CHF, HTN, HLD, CKD stage IIIa and insulin-dependent T2DM. Presented with worsening dyspnea, nonproductive cough and chest tightness. Elevated BNP at 744, chest xray negative for edema. ECHO from January 2022 showed EF of 55-60%.   Current HF Medications: Carvedilol 12.5 mg BID Furosemide 40 mg PO BID Hydralazine 25 mg TID Isosorbide mononitrate 30 mg daily  Spironolactone 25 mg daily  *amlodipine 5 mg daily  Prior to admission HF Medications: Carvedilol 12.5 mg BID Furosemide 40 mg BID Spironolactone 25 mg daily  Hydralazine 25 mg TID Isosorbide mononitrate 30 mg daily  *amlodipine 5 mg daily    Pertinent Lab Values: . Serum creatinine 1.26 (improved from 1.42), BUN 13, Potassium 3.6, Sodium 137, BNP 744, Magnesium 1.9   Vital Signs: . Weight: 196.8 lbs (admission weight: 198.4 lbs) . Blood pressure: 140-160s/80-90s  . Heart rate: 80-90s    Medication Assistance / Insurance Benefits Check: Does the patient have prescription insurance?  Yes Type of insurance plan: Medicare Prescription Insurance AARP   Does the patient qualify for medication assistance through manufacturers or grants?   Pending - he states that he received PAP for his diabetes medications, so may need assistance with HF meds if unaffordable    Outpatient Pharmacy:  Prior to admission outpatient pharmacy: [facility] Is the patient willing to use [facility] TOC pharmacy at discharge? Yes Is the patient willing to transition their outpatient pharmacy to utilize a [facility] outpatient pharmacy?   No - Offered    Assessment: 1. Acute on chronic systolic CHF (EF 41-66%). NYHA class II-III symptoms. - Agree with transition back to oral diuretics - no signs of  edema on physical exam, breathing noted to be improved, lacking urine output documentation.  - Avoid RAAS therapy - documented allergy with lisinopril (swelling) - Carvedilol - continue current dose, HR appropriate - Consider increasing dose of hydralazine to 50 mg TID - BP remains elevated despite initiation of home BP medications  - Continue current dose of spironolactone and isosorbide   Plan: 1) Medication changes recommended at this time: - Consider increasing hydralazine to 50 mg TID   2) Patient assistance: - Not needed at this time   3)  Education  - To be completed prior to discharge  Theodis Sato, PharmD PGY-1 [facility] Pharm[facility] Resident   01/23/2021 3:21 PM

## 2021-01-23 NOTE — Progress Notes (Signed)
Inpatient Diabetes Program Recommendations  AACE/ADA: New Consensus Statement on Inpatient Glycemic Control (2015)  Target Ranges:  Prepandial:   less than 140 mg/dL      Peak postprandial:   less than 180 mg/dL (1-2 hours)      Critically ill patients:  140 - 180 mg/dL   Lab Results  Component Value Date   GLUCAP 315 (H) 01/23/2021   HGBA1C 14.4 (H) 12/23/2020    Review of Glycemic Control Results for Kenneth [name], Kenneth [name] (MRN [id number]) as of 01/23/2021 10:34  Ref. Range 01/23/2021 00:09 01/23/2021 05:55  Glucose-Capillary Latest Ref Range: 70 - 99 mg/dL 242 (H) 683 (H)   Diabetes history: DM2 Outpatient Diabetes medications: Lantus 55 units QHS & prednisone 50 mg daily Current orders for Inpatient glycemic control: Lantus 35 units QHS & Novolog 0-9 units TID & Solumedrol 40 mg Q12H (received 125 mg on 5/3)  Inpatient Diabetes Program Recommendations:     Lantus 55 units QHS (home dose)  Novolog 0-15 units TID and 0-5 QHS May need meal coverage as well if post prandials elevated  Note:   Spoke with patient at bedside.  Reviewed patient's current A1c of 14.4% (average blood sugar of 367 mg/dL over the last 2-3 months). Explained what a A1c is and what it measures. Also reviewed goal A1c with patient, importance of good glucose control @ home, and blood sugar goals.  He states he has been on prednisone 10 mg for about 2-3 months.  He states his A1C is usually around 7%.  On 09/13/20 it was 6.3%; on 12/23/20 it was 14.4%.  He states he takes Lantus 55 units QHS.  His blood sugar is usually higher in the morning and around 100-150 mg/dL in the afternoon.  He admits to drinking beverages with sugar.  Encouraged him to switch to non-caloric beverages.  He states he will switch. Discussed The Plate Method; Ordered LWWD booklet.  He sees his PCP every 3-6 months.  Denies difficulty obtaining medications.  Discussed long and short term complications of elevated blood glucose.  Might consider discharging on  a sliding scale insulin regimen if he continues on steroids after discharge.  Also might consider adding an SGLT2.    Will continue to follow while inpatient.  Thank you, Dulce Sellar, RN, BSN Diabetes Coordinator Inpatient Diabetes Program [phone] (team pager from 8a-5p)

## 2021-01-23 NOTE — Progress Notes (Signed)
   01/23/21 0007  Vitals  Temp 98 F (36.7 C)  Temp Source Oral  BP (!) 163/95  MAP (mmHg) 114  BP Location Right Arm  BP Method Automatic  Patient Position (if appropriate) Sitting  Pulse Rate 91  Resp 18  Level of Consciousness  Level of Consciousness Alert  MEWS COLOR  MEWS Score Color Green  Oxygen Therapy  SpO2 100 %  O2 Device Nasal Cannula  O2 Flow Rate (L/min) 3 L/min  Height and Weight  Weight 89.3 kg  Type of Scale Used Standing  Type of Weight Actual  BMI (Calculated) 26.69  MEWS Score  MEWS Temp 0  MEWS Systolic 0  MEWS Pulse 0  MEWS RR 0  MEWS LOC 0  MEWS Score 0  Admitted patient to rm 3E10 from ED, pt is alert and oriented, denies pain at this time, oriented to room, call bell placed within reach, placed on cardiac monitor  CCMD made aware.

## 2021-01-23 NOTE — Consult Note (Signed)
   [facility] Inpatient Consult   01/23/2021  Kenneth [name] Oct 28, 1960 [id number]  [facility] [[facility]]  Accountable Care Organization [ACO] Patient: Kenneth [name]  Patient is currently active with [facility] [[facility]] Care Management for chronic care management services with the .  Patient has been engaged by an Embedded team with [facility] at [facility]. Our community based plan of care has focused on disease management and community resource support.  Spoke with the patient at the bedside.  He verbalizes ongoing follow up with his CCM team at [facility].  He states he might have new medications but nothing has been changed to his knowledge yet. Patient is currently in observation status.  Plan: Will follow progress and discussed in unit progression meeting for transitional care needs. Will update Embedded CCM team and Inpatient Transition Of Care [TOC] team member to make aware that [facility] Care Management following.   Of note, [facility] Care Management services does not replace or interfere with any services that are needed or arranged by inpatient [facility] care management team.  For additional questions or referrals please contact:  Kenneth Shanks, RN BSN CCM [facility]  [phone] business mobile phone Toll free office [phone]  Fax number: [phone] [name]

## 2021-01-23 NOTE — Progress Notes (Signed)
PROGRESS NOTE    Kenneth [name]  RJJ:[id number] DOB: Apr 14, 1961 DOA: 01/22/2021 PCP: [name], MD     Brief Narrative:  Kenneth [name] is a 60 y.o. male with medical history significant for COPD with asthma on chronic prednisone 10 mg daily, chronic respiratory failure with hypoxia on 3 L supplemental O2 via [place] at all times, chronic combined systolic and diastolic CHF (EF recovered to 01-60%), paroxysmal atrial fibrillation on Eliquis, CKD stage IIIa, insulin-dependent type 2 diabetes, hypertension, hyperlipidemia, depression/anxiety, and OSA on CPAP who presented to the ED for evaluation of dyspnea.  Patient states that he he developed sudden onset progressively worsening dyspnea while he was at home.  He says he was not significantly exerting himself but did take his dog outside a couple times.  He tried his home nebulizers without significant relief.  He has been having associated nonproductive cough.  He has had some chest tightness but denies any chest pressure.  He denies any subjective fevers, chills, diaphoresis, palpitations, abdominal pain, nausea, vomiting, dysuria, or lower extremity edema.  Initial vitals showed BP 143/81, pulse 104, RR 19, temp 97.6 F, SPO2 80% on room air, 100% while on BiPAP with 100% FiO2, subsequently weaned to 3 L supplemental O2 via [place] with O2 sats maintaining 99-100%.  Labs show WBC 8.6, hemoglobin 10.2, platelets 321,000, sodium 138, potassium 3.1, bicarb 34, BUN 13, creatinine 1.42, serum glucose 146, BNP 744.3, high-sensitivity troponin I 19 > 42, D-dimer 0.41.  Portable chest x-ray negative for focal consolidation, edema, effusion.  Patient was given IV Lasix 40 mg, Atrovent/albuterol nebulizer treatments, and IV Solu-Medrol 125 mg.  The hospitalist service was consulted to admit for further evaluation and management.   New events last 24 hours / Subjective: He states that his breathing has improved, admits to nonproductive cough, denies any  wheezing.  His breathing is not back to his baseline however.  He states that he was unable to tolerate his CPAP mask last night.  Assessment & Plan:   Principal Problem:   Acute on chronic respiratory failure with hypoxia (HCC) Active Problems:   Hypertension associated with diabetes (HCC)   Paroxysmal atrial fibrillation (HCC)   Type 2 diabetes mellitus with diabetic neuropathy, with long-term current use of insulin (HCC)   Obstructive sleep apnea   Chronic obstructive pulmonary disease with (acute) exacerbation (HCC)   Hypertensive kidney disease with chronic kidney disease stage III (HCC)   Acute on chronic combined systolic and diastolic CHF (congestive heart failure) (HCC)   Hyperlipidemia associated with type 2 diabetes mellitus (HCC)   Hypokalemia   Acute on chronic hypoxemic respiratory failure secondary to COPD exacerbation -Initially required BiPAP, weaned down to his baseline of 3 L nasal cannula O2 -Continue IV Solu-Medrol, duo nebs, Breo, incruse ellipta, roflumilast   Chronic combined systolic and diastolic heart failure -Did receive IV Lasix at time of admission -Continue home Lasix, Coreg, spironolactone, Imdur -Does not appear to be fluid overloaded on examination, chest x-ray negative for pulmonary edema  Demand ischemia -Secondary to above  Paroxysmal atrial fibrillation -Continue Eliquis, Coreg  Insulin-dependent type 2 diabetes -Lantus, sliding scale insulin dose adjusted today  CKD stage IIIa -Stable  Hypertension -Continue amlodipine, Coreg, hydralazine, Imdur, spironolactone  Hyperlipidemia -Continue Crestor  Depression/anxiety -Continue Abilify, BuSpar, Prozac  OSA -CPAP nightly  DVT prophylaxis:   apixaban (ELIQUIS) tablet 5 mg  Code Status:     Code Status Orders  (From admission, onward)         Start  Ordered   01/22/21 2348  Full code  Continuous        01/22/21 2349        Code Status History    Date Active Date  Inactive Code Status Order ID Comments User Context   12/23/2020 1446 12/25/2020 1513 Full Code [id number]  Clydie Braun, MD ED   10/23/2020 0103 10/24/2020 1933 Full Code [id number]  Synetta Fail, MD ED   10/04/2020 0014 10/13/2020 1842 Full Code [id number]  Cheri Fowler, MD ED   09/23/2020 1125 09/24/2020 1655 Full Code [id number]  Jonah Blue, MD ED   09/13/2020 0026 09/16/2020 1711 Full Code [id number]  Kirtland Bouchard, MD ED   06/20/2020 2007 06/22/2020 1703 Full Code [id number]  John Giovanni, MD ED   04/13/2020 1723 04/16/2020 1511 Full Code [id number]  Ollen Bowl, MD ED   04/06/2020 2314 04/08/2020 1638 Full Code [id number]  Anselm Jungling, DO ED   02/08/2020 2047 02/10/2020 2245 Full Code [id number]  Rometta Emery, MD ED   06/13/2019 0919 06/13/2019 1839 Full Code [id number]  Allayne Butcher, PA-C Inpatient   06/12/2019 2210 06/13/2019 0919 DNR [id number]  Laverna Peace, MD Inpatient   06/12/2019 1749 06/12/2019 2209 DNR [id number]  Laverna Peace, MD ED   07/27/2018 2202 07/30/2018 1724 Full Code [id number]  Briscoe Deutscher, MD ED   Advance Care Planning Activity     Family Communication: None at bedside Disposition Plan:  Status is: Observation  The patient will require care spanning > 2 midnights and should be moved to inpatient because: IV treatments appropriate due to intensity of illness or inability to take PO  Dispo: The patient is from: Home              Anticipated d/c is to: Home              Patient currently is not medically stable to d/c.  Breathing is not back to baseline, continue IV Solu-Medrol   Difficult to place patient No    Antimicrobials:  Anti-infectives (From admission, onward)   None        Objective: Vitals:   01/23/21 0725 01/23/21 0754 01/23/21 0832 01/23/21 1017  BP: (!) 184/96 (!) 142/82  (!) 149/98  Pulse: 85 85    Resp: 18     Temp: 98 F (36.7 C)     TempSrc: Oral     SpO2: 100% 100% 99%   Weight:      Height:         Intake/Output Summary (Last 24 hours) at 01/23/2021 1240 Last data filed at 01/23/2021 1035 Gross per 24 hour  Intake 770 ml  Output 301 ml  Net 469 ml   Filed Weights   01/22/21 1737 01/23/21 0007  Weight: 90 kg 89.3 kg    Examination:  General exam: Appears calm and comfortable  Respiratory system: Diminished breath sounds bilaterally, respiratory effort normal, on nasal cannula O2 Cardiovascular system: S1 & S2 heard, RRR. No murmurs. No pedal edema. Gastrointestinal system: Abdomen is nondistended, soft and nontender. Normal bowel sounds heard. Central nervous system: Alert and oriented. No focal neurological deficits. Speech clear.  Extremities: Symmetric in appearance  Skin: No rashes, lesions or ulcers on exposed skin  Psychiatry: Judgement and insight appear normal. Mood & affect appropriate.   Data Reviewed: I have personally reviewed following labs and imaging studies  CBC: Recent Labs  Lab 01/22/21 1739 01/23/21 0442  WBC 8.6 8.1  HGB 10.2*  10.3*  HCT 34.7* 34.9*  MCV 92.8 91.4  PLT 321 294   Basic Metabolic Panel: Recent Labs  Lab 01/22/21 1739 01/23/21 0442  NA 138 137  K 3.1* 3.6  CL 95* 91*  CO2 34* 37*  GLUCOSE 146* 325*  BUN 13 13  CREATININE 1.42* 1.26*  CALCIUM 8.7* 9.3  MG  --  1.9   GFR: Estimated Creatinine Clearance: 69.3 mL/min (A) (by C-G formula based on SCr of 1.26 mg/dL (H)). Liver Function Tests: No results for input(s): AST, ALT, ALKPHOS, BILITOT, PROT, ALBUMIN in the last 168 hours. No results for input(s): LIPASE, AMYLASE in the last 168 hours. No results for input(s): AMMONIA in the last 168 hours. Coagulation Profile: No results for input(s): INR, PROTIME in the last 168 hours. Cardiac Enzymes: No results for input(s): CKTOTAL, CKMB, CKMBINDEX, TROPONINI in the last 168 hours. BNP (last 3 results) No results for input(s): PROBNP in the last 8760 hours. HbA1C: No results for input(s): HGBA1C in the last 72  hours. CBG: Recent Labs  Lab 01/23/21 0009 01/23/21 0555 01/23/21 1201  GLUCAP 158* 315* 218*   Lipid Profile: No results for input(s): CHOL, HDL, LDLCALC, TRIG, CHOLHDL, LDLDIRECT in the last 72 hours. Thyroid Function Tests: No results for input(s): TSH, T4TOTAL, FREET4, T3FREE, THYROIDAB in the last 72 hours. Anemia Panel: No results for input(s): VITAMINB12, FOLATE, FERRITIN, TIBC, IRON, RETICCTPCT in the last 72 hours. Sepsis Labs: No results for input(s): PROCALCITON, LATICACIDVEN in the last 168 hours.  Recent Results (from the past 240 hour(s))  Resp Panel by RT-PCR (Flu A&B, Covid) Nasopharyngeal Swab     Status: None   Collection Time: 01/22/21  5:35 PM   Specimen: Nasopharyngeal Swab; Nasopharyngeal(NP) swabs in vial transport medium  Result Value Ref Range Status   SARS Coronavirus 2 by RT PCR NEGATIVE NEGATIVE Final    Comment: (NOTE) SARS-CoV-2 target nucleic acids are NOT DETECTED.  The SARS-CoV-2 RNA is generally detectable in upper respiratory specimens during the acute phase of infection. The lowest concentration of SARS-CoV-2 viral copies this assay can detect is 138 copies/mL. A negative result does not preclude SARS-Cov-2 infection and should not be used as the sole basis for treatment or other patient management decisions. A negative result may occur with  improper specimen collection/handling, submission of specimen other than nasopharyngeal swab, presence of viral mutation(s) within the areas targeted by this assay, and inadequate number of viral copies(<138 copies/mL). A negative result must be combined with clinical observations, patient history, and epidemiological information. The expected result is Negative.  Fact Sheet for Patients:  BloggerCourse.com  Fact Sheet for Healthcare Providers:  SeriousBroker.it  This test is no t yet approved or cleared by the [place] FDA and  has been  authorized for detection and/or diagnosis of SARS-CoV-2 by FDA under an Emergency Use Authorization (EUA). This EUA will remain  in effect (meaning this test can be used) for the duration of the COVID-19 declaration under Section 564(b)(1) of the Act, 21 U.S.C.section 360bbb-3(b)(1), unless the authorization is terminated  or revoked sooner.       Influenza A by PCR NEGATIVE NEGATIVE Final   Influenza B by PCR NEGATIVE NEGATIVE Final    Comment: (NOTE) The Xpert Xpress SARS-CoV-2/FLU/RSV plus assay is intended as an aid in the diagnosis of influenza from Nasopharyngeal swab specimens and should not be used as a sole basis for treatment. Nasal washings and aspirates are unacceptable for Xpert Xpress SARS-CoV-2/FLU/RSV testing.  Fact Sheet for  Patients: BloggerCourse.com  Fact Sheet for Healthcare Providers: SeriousBroker.it  This test is not yet approved or cleared by the [place] FDA and has been authorized for detection and/or diagnosis of SARS-CoV-2 by FDA under an Emergency Use Authorization (EUA). This EUA will remain in effect (meaning this test can be used) for the duration of the COVID-19 declaration under Section 564(b)(1) of the Act, 21 U.S.C. section 360bbb-3(b)(1), unless the authorization is terminated or revoked.  Performed at [facility], [address] [place], [place] [address]       Radiology Studies: DG Chest Port 1 View  Result Date: 01/22/2021 CLINICAL DATA:  Dyspnea EXAM: PORTABLE CHEST 1 VIEW COMPARISON:  12/23/2020, CT 10/03/2020 FINDINGS: Lungs are clear. No pneumothorax or pleural effusion. Multiple nodules noted over the right apex represent callus related to multiple anterior right rib fractures noted on prior CT examination of 10/03/2020. Cardiac size is within normal limits. Pulmonary vascularity is normal. No acute bone abnormality. IMPRESSION: No active disease. Electronically Signed    By: Helyn Numbers MD   On: 01/22/2021 18:04      Scheduled Meds: . amLODipine  5 mg Oral q morning  . apixaban  5 mg Oral BID  . ARIPiprazole  7 mg Oral Daily  . busPIRone  15 mg Oral BID  . carvedilol  12.5 mg Oral BID WC  . famotidine  20 mg Oral Daily  . FLUoxetine  20 mg Oral Daily  . fluticasone furoate-vilanterol  1 puff Inhalation Daily   And  . umeclidinium bromide  1 puff Inhalation Daily  . furosemide  40 mg Oral BID  . guaiFENesin  600 mg Oral BID  . hydrALAZINE  25 mg Oral TID  . insulin aspart  0-15 Units Subcutaneous TID WC  . insulin aspart  0-5 Units Subcutaneous QHS  . insulin glargine  55 Units Subcutaneous QHS  . ipratropium-albuterol  3 mL Nebulization TID  . isosorbide mononitrate  30 mg Oral Daily  . methylPREDNISolone (SOLU-MEDROL) injection  40 mg Intravenous Q12H  . roflumilast  250 mcg Oral Daily  . rosuvastatin  20 mg Oral Daily  . sodium chloride flush  3 mL Intravenous Q12H  . spironolactone  25 mg Oral Daily   Continuous Infusions:   LOS: 0 days      Time spent: 20 minutes   Noralee Stain, DO [facility] 01/23/2021, 12:40 PM   Available via Epic secure chat 7am-7pm After these hours, please refer to coverage provider listed on [email or web address]

## 2021-01-23 NOTE — Discharge Instructions (Signed)

## 2021-01-23 NOTE — Progress Notes (Signed)
Heart Failure Nurse Navigator Progress Note  PCP: [name], MD PCP-Cardiologist: none on file Admission Diagnosis: A/C CHF Admitted from: home alone  Presentation:   Kenneth [name] presented with SOB, nonproductive cough, chest tightness. Pt sitting on side of bed during interview on 3 L O2 per [place] (home dose). Pt interactive during process. States he lives at home with his dog. Recieves $1100/mo disability, $16/mo food stamps. States he meets his monthly bills, but also has utilized pt assistance for medications in the past---pharmacy present at time of interview. Pt does drive, but would also like to be signed up for [facility] up DC.   ECHO/ LVEF: 55-60%  Clinical Course:  Past Medical History:  Diagnosis Date  . Anxiety   . Arthritis   . Atrial fibrillation (HCC)   . Chest tightness 06/12/2019  . CHF (congestive heart failure) (HCC)   . COPD (chronic obstructive pulmonary disease) (HCC)    emphysema  . Depression   . Diabetes mellitus without complication (HCC)   . Heart murmur   . Hyperlipidemia   . Hypertension   . Sleep apnea with use of continuous positive airway pressure (CPAP)      Social History   Socioeconomic History  . Marital status: Divorced    Spouse name: Not on file  . Number of children: 1  . Years of education: Not on file  . Highest education level: Associate degree: occupational, Scientist, product/process development, or vocational program  Occupational History  . Occupation: disabled    Comment: Disability $1100/mo  Tobacco Use  . Smoking status: Former Smoker    Packs/day: 2.50    Years: 30.00    Pack years: 75.00    Types: Cigarettes    Quit date: 06/2020    Years since quitting: 0.5  . Smokeless tobacco: Never Used  Vaping Use  . Vaping Use: Never used  Substance and Sexual Activity  . Alcohol use: Not Currently    Comment: pt stated that it was 6 months ago he drunk bourbon  . Drug use: Yes    Frequency: 1.0 times per week    Types: Marijuana     Comment: occ marijuana  . Sexual activity: Yes    Partners: Female    Birth control/protection: None  Other Topics Concern  . Not on file  Social History Narrative  . Not on file   Social Determinants of Health   Financial Resource Strain: Low Risk   . Difficulty of Paying Living Expenses: Not very hard  Food Insecurity: No Food Insecurity  . Worried About Programme researcher, broadcasting/film/video in the Last Year: Never true  . Ran Out of Food in the Last Year: Never true  Transportation Needs: No Transportation Needs  . Lack of Transportation (Medical): No  . Lack of Transportation (Non-Medical): No  Physical Activity: Sufficiently Active  . Days of Exercise per Week: 2 days  . Minutes of Exercise per Session: 90 min  Stress: Stress Concern Present  . Feeling of Stress : To some extent  Social Connections: Not on file    High Risk Criteria for Readmission and/or Poor Patient Outcomes:  Heart failure hospital admissions (last 6 months): 1   No Show rate: 6%  Difficult social situation: yes, low social support  Demonstrates medication adherence: no  Primary Language: English  Literacy level: able to read/write and comprehend  Education Assessment and Provision:  Detailed education and instructions provided on heart failure disease management including the following:  Signs and symptoms  of Heart Failure When to call the physician Importance of [name] weights Low sodium diet Fluid restriction Medication management Anticipated future follow-up appointments  Patient education given on each of the above topics.  Patient acknowledges understanding via teach back method and acceptance of all instructions.  Education Materials:  "Living Better With Heart Failure" Booklet, HF zone tool, & [name] Weight Tracker Tool.  Patient has scale at home: yes Patient has pill box at home: no, given from AHF clinic   Barriers of Care:   -medication compliance -low social  support  Considerations/Referrals:   Referral made to Heart Failure Pharmacist Stewardship: yes, appreciated Referral made to Heart & Vascular TOC clinic: pending, will place closer to DC date.   Items for Follow-up on DC/TOC: -medication compliance -medication optimization -social support  Ozella Rocks, RN, BSN Heart Failure Nurse Navigator [phone]

## 2021-01-24 ENCOUNTER — Other Ambulatory Visit (HOSPITAL_COMMUNITY): Payer: Self-pay

## 2021-01-24 LAB — CBC
HCT: 33.9 % — ABNORMAL LOW (ref 39.0–52.0)
Hemoglobin: 10.2 g/dL — ABNORMAL LOW (ref 13.0–17.0)
MCH: 27.5 pg (ref 26.0–34.0)
MCHC: 30.1 g/dL (ref 30.0–36.0)
MCV: 91.4 fL (ref 80.0–100.0)
Platelets: 281 10*3/uL (ref 150–400)
RBC: 3.71 MIL/uL — ABNORMAL LOW (ref 4.22–5.81)
RDW: 13 % (ref 11.5–15.5)
WBC: 9.2 10*3/uL (ref 4.0–10.5)
nRBC: 0 % (ref 0.0–0.2)

## 2021-01-24 LAB — BASIC METABOLIC PANEL
Anion gap: 7 (ref 5–15)
BUN: 27 mg/dL — ABNORMAL HIGH (ref 6–20)
CO2: 36 mmol/L — ABNORMAL HIGH (ref 22–32)
Calcium: 9.4 mg/dL (ref 8.9–10.3)
Chloride: 93 mmol/L — ABNORMAL LOW (ref 98–111)
Creatinine, Ser: 1.25 mg/dL — ABNORMAL HIGH (ref 0.61–1.24)
GFR, Estimated: >60 mL/min (ref >60)
Glucose, Bld: 288 mg/dL — ABNORMAL HIGH (ref 70–99)
Potassium: 4.2 mmol/L (ref 3.5–5.1)
Sodium: 136 mmol/L (ref 135–145)

## 2021-01-24 LAB — GLUCOSE, CAPILLARY: Glucose-Capillary: 187 mg/dL — ABNORMAL HIGH (ref 70–99)

## 2021-01-24 MED ORDER — PREDNISONE 10 MG PO TABS
ORAL_TABLET | ORAL | 0 refills | Status: DC
  Filled 2021-01-24: qty 32, 14d supply, fill #0

## 2021-01-24 MED ORDER — LUBIPROSTONE 8 MCG PO CAPS: 8.0000 ug | ORAL_CAPSULE | Freq: Two times a day (BID) | ORAL | 2 refills | Status: DC

## 2021-01-24 MED ORDER — FLUOXETINE HCL 20 MG PO CAPS: 20.0000 mg | ORAL_CAPSULE | Freq: Every day | ORAL | 0 refills | Status: DC

## 2021-01-24 MED ORDER — HYDRALAZINE HCL 50 MG PO TABS
50.0000 mg | ORAL_TABLET | Freq: Three times a day (TID) | ORAL | 0 refills | Status: DC
  Filled 2021-01-24: qty 90, 30d supply, fill #0

## 2021-01-24 NOTE — Progress Notes (Signed)
D/C instructions given and reviewed. Tele and IV removed, tolerated well. Awaiting TOC meds to be delivered. 

## 2021-01-24 NOTE — Progress Notes (Signed)
Heart Failure Nurse Navigator Progress Note  Scheduled pt for HV TOC on 5/10 @ 9AM per pt time request. Pt has follow up with Dr. Cristal Deer on 5/19. Navigator enrolled and set up [employer] for initial appt 5/10, gave pt information to self-schedule future appt transportation needs.   Ozella Rocks, RN, BSN Heart Failure Nurse Navigator [phone]

## 2021-01-24 NOTE — Progress Notes (Signed)
   Pt is active in [facility], the home-based [facility] division of [facility].  SN and SW support will resume when pt returns home.  Please call [facility] if we can be of assistance. Julius Bowels, RN  [phone]

## 2021-01-24 NOTE — Discharge Summary (Signed)
Physician Discharge Summary  Kenneth [name] TWK:[id number] DOB: Feb 14, 1961 DOA: 01/22/2021  PCP: Kenneth Salter, MD  Admit date: 01/22/2021 Discharge date: 01/24/2021  Admitted From: Home Disposition:  Home  Recommendations for Outpatient Follow-up:  1. Follow up with PCP in 1 week  Discharge Condition: Stable CODE STATUS: Full  Diet recommendation: Carb modified   Brief/Interim Summary: Kenneth [name] a 60 y.o.malewith medical history significant forCOPD with asthmaon chronic prednisone 10 mg daily, chronic respiratory failure with hypoxia on 3 L supplemental O2 via [place] at all times, chronic combined systolic and diastolic CHF (EF recovered to 60-65%), paroxysmal atrial fibrillation on Eliquis, CKD stage IIIa, insulin-dependent type 2 diabetes, hypertension, hyperlipidemia, depression/anxiety, and OSA on CPAP who presented to the ED for evaluation of dyspnea.  Patient states that he he developed sudden onset progressively worsening dyspnea while he was at home. He says he was not significantly exerting himself but did take his dog outside a couple times. He tried his home nebulizers without significant relief. He has been having associated nonproductive cough. He has had some chest tightness but denies any chest pressure. He denies any subjective fevers, chills, diaphoresis, palpitations, abdominal pain, nausea, vomiting, dysuria, or lower extremity edema.  Initial vitals showed BP 143/81, pulse 104, RR 19, temp 97.6 F, SPO2 80% on room air, 100% while on BiPAP with 100% FiO2, subsequently weaned to 3 L supplemental O2 via [place] with O2 sats maintaining 99-100%.  Labs show WBC 8.6, hemoglobin 10.2, platelets 321,000, sodium 138, potassium 3.1, bicarb 34, BUN 13, creatinine 1.42, serum glucose 146, BNP 744.3, high-sensitivity troponin I 19 > 42, D-dimer 0.41.  Portable chest x-ray negative for focal consolidation, edema, effusion.  Patient was given IV Lasix 40 mg, Atrovent/albuterol  nebulizer treatments, and IV Solu-Medrol 125 mg. The hospitalist service was consulted to admit for further evaluation and management.    Patient continued to improve on steroid and breathing treatment. He did not appear to be fluid overloaded and lasix was stopped.   Discharge Diagnoses:  Principal Problem:   Acute on chronic respiratory failure with hypoxia (HCC) Active Problems:   Hypertension associated with diabetes (HCC)   Paroxysmal atrial fibrillation (HCC)   Type 2 diabetes mellitus with diabetic neuropathy, with long-term current use of insulin (HCC)   Obstructive sleep apnea   Chronic obstructive pulmonary disease with (acute) exacerbation (HCC)   Hypertensive kidney disease with chronic kidney disease stage III (HCC)   Acute on chronic combined systolic and diastolic CHF (congestive heart failure) (HCC)   Hyperlipidemia associated with type 2 diabetes mellitus (HCC)   Hypokalemia   Acute on chronic hypoxemic respiratory failure secondary to COPD exacerbation -Initially required BiPAP, weaned down to his baseline of 3 L nasal cannula O2 -Continue IV Solu-Medrol --> prednisone taper, duo nebs, Breo, incruse ellipta, roflumilast   Chronic combined systolic and diastolic heart failure -Did receive IV Lasix at time of admission -Does not appear to be fluid overloaded on examination, chest x-ray negative for pulmonary edema -Continue home Lasix, Coreg, spironolactone, Imdur  Demand ischemia -Secondary to above, troponin 19, 42, 31   Paroxysmal atrial fibrillation -Continue Eliquis, Coreg  Insulin-dependent type 2 diabetes -Continue insulin   CKD stage IIIa -Stable  Hypertension -Continue amlodipine, Coreg, hydralazine, Imdur, spironolactone -Hydralazine dose increased due to elevated diastolic BP   Hyperlipidemia -Continue Crestor  Depression/anxiety -Continue Abilify, BuSpar, Prozac  OSA -CPAP nightly   Discharge Instructions  Discharge  Instructions    (HEART FAILURE PATIENTS) Call MD:  Anytime you  have any of the following symptoms: 1) 3 pound weight gain in 24 hours or 5 pounds in 1 week 2) shortness of breath, with or without a dry hacking cough 3) swelling in the hands, feet or stomach 4) if you have to sleep on extra pillows at night in order to breathe.   Complete by: As directed    Call MD for:  difficulty breathing, headache or visual disturbances   Complete by: As directed    Call MD for:  extreme fatigue   Complete by: As directed    Call MD for:  persistant dizziness or light-headedness   Complete by: As directed    Call MD for:  persistant nausea and vomiting   Complete by: As directed    Call MD for:  severe uncontrolled pain   Complete by: As directed    Call MD for:  temperature >100.4   Complete by: As directed    Discharge instructions   Complete by: As directed    You were cared for by a hospitalist during your hospital stay. If you have any questions about your discharge medications or the care you received while you were in the hospital after you are discharged, you can call the unit and ask to speak with the hospitalist on call if the hospitalist that took care of you is not available. Once you are discharged, your primary care physician will handle any further medical issues. Please note that NO REFILLS for any discharge medications will be authorized once you are discharged, as it is imperative that you return to your primary care physician (or establish a relationship with a primary care physician if you do not have one) for your aftercare needs so that they can reassess your need for medications and monitor your lab values.   Increase activity slowly   Complete by: As directed      Allergies as of 01/24/2021      Reactions   Lisinopril Swelling   Other Other (See Comments)   Lettuce : rash   Tomato Rash      Medication List    STOP taking these medications   nystatin 100000 UNIT/ML  suspension Commonly known as: MYCOSTATIN     TAKE these medications   acetaminophen 325 MG tablet Commonly known as: TYLENOL Take 650 mg by mouth every 6 (six) hours as needed for mild pain or headache.   albuterol (2.5 MG/3ML) 0.083% nebulizer solution Commonly known as: PROVENTIL Take 3 mLs (2.5 mg total) by nebulization every 6 (six) hours as needed for shortness of breath.   amLODipine 5 MG tablet Commonly known as: NORVASC Take 5 mg by mouth every morning.   ARIPiprazole 5 MG tablet Commonly known as: ABILIFY Take 7 mg by mouth daily.   busPIRone 15 MG tablet Commonly known as: BUSPAR Take 1 tablet (15 mg total) by mouth 2 (two) times daily.   carvedilol 12.5 MG tablet Commonly known as: COREG Take 1 tablet (12.5 mg total) by mouth 2 (two) times daily with a meal.   clotrimazole 10 MG troche Commonly known as: MYCELEX Take 1 tablet (10 mg total) by mouth 5 (five) times daily.   Coricidin HBP Day/Night Cold 10-20 &15-200-2 MG Misc Generic drug: DM-GG & DM-APAP-CPM Take 1 tablet by mouth every 12 (twelve) hours as needed (cold symptoms).   Daliresp 250 MCG Tabs Generic drug: Roflumilast Take 250 mcg by mouth daily.   Eliquis 5 MG Tabs tablet Generic drug: apixaban TAKE ONE TABLET BY  MOUTH EVERY MORNING and TAKE ONE TABLET BY MOUTH EVERY EVENING What changed: See the new instructions.   famotidine 20 MG tablet Commonly known as: PEPCID Take 20 mg by mouth daily.   FLUoxetine 20 MG capsule Commonly known as: PROZAC TAKE 3 CAPSULES BY MOUTH  DAILY What changed: how much to take   furosemide 40 MG tablet Commonly known as: LASIX Take 1 tablet (40 mg total) by mouth 2 (two) times daily.   gabapentin 100 MG capsule Commonly known as: NEURONTIN Take 2 capsules (200 mg total) by mouth 3 (three) times daily. What changed:   when to take this  reasons to take this   guaiFENesin 600 MG 12 hr tablet Commonly known as: MUCINEX Take 1 tablet (600 mg total)  by mouth 2 (two) times daily.   hydrALAZINE 50 MG tablet Commonly known as: APRESOLINE Take 1 tablet (50 mg total) by mouth 3 (three) times daily. What changed:   medication strength  how much to take   ipratropium-albuterol 0.5-2.5 (3) MG/3ML Soln Commonly known as: DUONEB Take 3 mLs by nebulization every 6 (six) hours as needed.   isosorbide mononitrate 30 MG 24 hr tablet Commonly known as: IMDUR Take 30 mg by mouth daily.   ketoconazole 2 % cream Commonly known as: NIZORAL Apply 1 application topically daily.   Lantus SoloStar 100 UNIT/ML Solostar Pen Generic drug: insulin glargine Inject 55 Units into the skin at bedtime. If fasting blood sugars remain >150 for 1 week, increase insulin dose by 5 units. Maximum of 80 units.   lubiprostone 8 MCG capsule Commonly known as: [name] Take 1 capsule (8 mcg total) by mouth 2 (two) times daily with a meal.   multivitamin with minerals Tabs tablet Take 1 tablet by mouth daily.   nitroGLYCERIN 0.4 MG SL tablet Commonly known as: NITROSTAT DISSOLVE 1 TABLET UNDER THE TONGUE EVERY 5 MINUTES AS  NEEDED FOR CHEST PAIN. MAX  OF 3 TABLETS IN 15 MINUTES. CALL 911 IF PAIN PERSISTS. What changed:   how much to take  how to take this  when to take this  reasons to take this  additional instructions   ONE TOUCH ULTRA 2 w/Device Kit Use to test blood sugars 1-2 times daily.   One-A-Day Mens 50+ Tabs Take 1 tablet by mouth daily.   OneTouch Ultra test strip Generic drug: glucose blood USE TO TEST BLOOD SUGAR 1-2 TIMES DAILY   onetouch ultrasoft lancets USE TO TEST BLOOD SUGAR 1-2 TIMES DAILY   pantoprazole 40 MG tablet Commonly known as: PROTONIX TAKE ONE TABLET BY MOUTH EVERY MORNING What changed: when to take this   Pen Needles 31G X 6 MM Misc 1 application by Does not apply route at bedtime.   predniSONE 10 MG tablet Commonly known as: DELTASONE Take 4 tabs for 3 days, then 3 tabs for 3 days, then 2 tabs for 3  days, then 1 tab daily. What changed:   additional instructions  Another medication with the same name was removed. Continue taking this medication, and follow the directions you see here.   pseudoephedrine 60 MG tablet Commonly known as: SUDAFED Take 60 mg by mouth every 4 (four) hours as needed for congestion.   rosuvastatin 20 MG tablet Commonly known as: CRESTOR TAKE ONE TABLET BY MOUTH ONCE DAILY   spironolactone 25 MG tablet Commonly known as: ALDACTONE Take 25 mg by mouth daily.   Trelegy Ellipta 100-62.5-25 MCG/INH Aepb Generic drug: Fluticasone-Umeclidin-Vilant Inhale 1 puff into the lungs  daily.       Follow-up Information    Kenneth Salter, MD. Schedule an appointment as soon as possible for a visit in 1 week(s).   Specialty: Family Medicine Contact information: [address] [place] [address] [phone]              Allergies  Allergen Reactions  . Lisinopril Swelling  . Other Other (See Comments)    Lettuce : rash  . Tomato Rash    Consultations:  None    Procedures/Studies: DG Chest Port 1 View  Result Date: 01/22/2021 CLINICAL DATA:  Dyspnea EXAM: PORTABLE CHEST 1 VIEW COMPARISON:  12/23/2020, CT 10/03/2020 FINDINGS: Lungs are clear. No pneumothorax or pleural effusion. Multiple nodules noted over the right apex represent callus related to multiple anterior right rib fractures noted on prior CT examination of 10/03/2020. Cardiac size is within normal limits. Pulmonary vascularity is normal. No acute bone abnormality. IMPRESSION: No active disease. Electronically Signed   By: Fidela Salisbury MD   On: 01/22/2021 18:04      Discharge Exam: Vitals:   01/24/21 0752 01/24/21 0914  BP:  (!) 165/80  Pulse:  75  Resp:    Temp:    SpO2: 99% 100%    General: Pt is alert, awake, not in acute distress Cardiovascular: RRR, S1/S2 +, no edema Respiratory: CTA bilaterally but diminished in bases, no wheezing, no rhonchi, no  respiratory distress, no conversational dyspnea, on [place] O2 Abdominal: Soft, NT, ND, bowel sounds + Extremities: no edema, no cyanosis Psych: Normal mood and affect, stable judgement and insight     The results of significant diagnostics from this hospitalization (including imaging, microbiology, ancillary and laboratory) are listed below for reference.     Microbiology: Recent Results (from the past 240 hour(s))  Resp Panel by RT-PCR (Flu A&B, Covid) Nasopharyngeal Swab     Status: None   Collection Time: 01/22/21  5:35 PM   Specimen: Nasopharyngeal Swab; Nasopharyngeal(NP) swabs in vial transport medium  Result Value Ref Range Status   SARS Coronavirus 2 by RT PCR NEGATIVE NEGATIVE Final    Comment: (NOTE) SARS-CoV-2 target nucleic acids are NOT DETECTED.  The SARS-CoV-2 RNA is generally detectable in upper respiratory specimens during the acute phase of infection. The lowest concentration of SARS-CoV-2 viral copies this assay can detect is 138 copies/mL. A negative result does not preclude SARS-Cov-2 infection and should not be used as the sole basis for treatment or other patient management decisions. A negative result may occur with  improper specimen collection/handling, submission of specimen other than nasopharyngeal swab, presence of viral mutation(s) within the areas targeted by this assay, and inadequate number of viral copies(<138 copies/mL). A negative result must be combined with clinical observations, patient history, and epidemiological information. The expected result is Negative.  Fact Sheet for Patients:  EntrepreneurPulse.com.au  Fact Sheet for Healthcare Providers:  IncredibleEmployment.be  This test is no t yet approved or cleared by the [place] FDA and  has been authorized for detection and/or diagnosis of SARS-CoV-2 by FDA under an Emergency Use Authorization (EUA). This EUA will remain  in effect (meaning this  test can be used) for the duration of the COVID-19 declaration under Section 564(b)(1) of the Act, 21 U.S.C.section 360bbb-3(b)(1), unless the authorization is terminated  or revoked sooner.       Influenza A by PCR NEGATIVE NEGATIVE Final   Influenza B by PCR NEGATIVE NEGATIVE Final    Comment: (NOTE) The Xpert Xpress SARS-CoV-2/FLU/RSV  plus assay is intended as an aid in the diagnosis of influenza from Nasopharyngeal swab specimens and should not be used as a sole basis for treatment. Nasal washings and aspirates are unacceptable for Xpert Xpress SARS-CoV-2/FLU/RSV testing.  Fact Sheet for Patients: EntrepreneurPulse.com.au  Fact Sheet for Healthcare Providers: IncredibleEmployment.be  This test is not yet approved or cleared by the [place] FDA and has been authorized for detection and/or diagnosis of SARS-CoV-2 by FDA under an Emergency Use Authorization (EUA). This EUA will remain in effect (meaning this test can be used) for the duration of the COVID-19 declaration under Section 564(b)(1) of the Act, 21 U.S.C. section 360bbb-3(b)(1), unless the authorization is terminated or revoked.  Performed at [facility], [place] [address], [place], [place] [address]      Labs: BNP (last 3 results) Recent Labs    10/23/20 0417 12/23/20 1203 01/22/21 1739  BNP 1,701.2* 393.8* 680.3*   Basic Metabolic Panel: Recent Labs  Lab 01/22/21 1739 01/23/21 0442 01/24/21 0248  NA 138 137 136  K 3.1* 3.6 4.2  CL 95* 91* 93*  CO2 34* 37* 36*  GLUCOSE 146* 325* 288*  BUN 13 13 27*  CREATININE 1.42* 1.26* 1.25*  CALCIUM 8.7* 9.3 9.4  MG  --  1.9  --    Liver Function Tests: No results for input(s): AST, ALT, ALKPHOS, BILITOT, PROT, ALBUMIN in the last 168 hours. No results for input(s): LIPASE, AMYLASE in the last 168 hours. No results for input(s): AMMONIA in the last 168 hours. CBC: Recent Labs  Lab 01/22/21 1739  01/23/21 0442 01/24/21 0248  WBC 8.6 8.1 9.2  HGB 10.2* 10.3* 10.2*  HCT 34.7* 34.9* 33.9*  MCV 92.8 91.4 91.4  PLT 321 294 281   Cardiac Enzymes: No results for input(s): CKTOTAL, CKMB, CKMBINDEX, TROPONINI in the last 168 hours. BNP: Invalid input(s): POCBNP CBG: Recent Labs  Lab 01/23/21 0555 01/23/21 1201 01/23/21 1625 01/23/21 2036 01/24/21 0555  GLUCAP 315* 218* 254* 186* 187*   D-Dimer Recent Labs    01/22/21 2030  DDIMER 0.41   Hgb A1c No results for input(s): HGBA1C in the last 72 hours. Lipid Profile No results for input(s): CHOL, HDL, LDLCALC, TRIG, CHOLHDL, LDLDIRECT in the last 72 hours. Thyroid function studies No results for input(s): TSH, T4TOTAL, T3FREE, THYROIDAB in the last 72 hours.  Invalid input(s): FREET3 Anemia work up No results for input(s): VITAMINB12, FOLATE, FERRITIN, TIBC, IRON, RETICCTPCT in the last 72 hours. Urinalysis    Component Value Date/Time   COLORURINE AMBER (A) 10/03/2020 2036   APPEARANCEUR CLOUDY (A) 10/03/2020 2036   LABSPEC 1.016 10/03/2020 2036   PHURINE 6.0 10/03/2020 2036   GLUCOSEU >=500 (A) 10/03/2020 2036   GLUCOSEU NEGATIVE 05/20/2019 0825   HGBUR NEGATIVE 10/03/2020 2036   BILIRUBINUR NEGATIVE 10/03/2020 2036   KETONESUR NEGATIVE 10/03/2020 2036   PROTEINUR >=300 (A) 10/03/2020 2036   UROBILINOGEN 1.0 05/20/2019 0825   NITRITE NEGATIVE 10/03/2020 2036   LEUKOCYTESUR NEGATIVE 10/03/2020 2036   Sepsis Labs Invalid input(s): PROCALCITONIN,  WBC,  LACTICIDVEN Microbiology Recent Results (from the past 240 hour(s))  Resp Panel by RT-PCR (Flu A&B, Covid) Nasopharyngeal Swab     Status: None   Collection Time: 01/22/21  5:35 PM   Specimen: Nasopharyngeal Swab; Nasopharyngeal(NP) swabs in vial transport medium  Result Value Ref Range Status   SARS Coronavirus 2 by RT PCR NEGATIVE NEGATIVE Final    Comment: (NOTE) SARS-CoV-2 target nucleic acids are NOT DETECTED.  The  SARS-CoV-2 RNA is generally  detectable in upper respiratory specimens during the acute phase of infection. The lowest concentration of SARS-CoV-2 viral copies this assay can detect is 138 copies/mL. A negative result does not preclude SARS-Cov-2 infection and should not be used as the sole basis for treatment or other patient management decisions. A negative result may occur with  improper specimen collection/handling, submission of specimen other than nasopharyngeal swab, presence of viral mutation(s) within the areas targeted by this assay, and inadequate number of viral copies(<138 copies/mL). A negative result must be combined with clinical observations, patient history, and epidemiological information. The expected result is Negative.  Fact Sheet for Patients:  EntrepreneurPulse.com.au  Fact Sheet for Healthcare Providers:  IncredibleEmployment.be  This test is no t yet approved or cleared by the [place] FDA and  has been authorized for detection and/or diagnosis of SARS-CoV-2 by FDA under an Emergency Use Authorization (EUA). This EUA will remain  in effect (meaning this test can be used) for the duration of the COVID-19 declaration under Section 564(b)(1) of the Act, 21 U.S.C.section 360bbb-3(b)(1), unless the authorization is terminated  or revoked sooner.       Influenza A by PCR NEGATIVE NEGATIVE Final   Influenza B by PCR NEGATIVE NEGATIVE Final    Comment: (NOTE) The Xpert Xpress SARS-CoV-2/FLU/RSV plus assay is intended as an aid in the diagnosis of influenza from Nasopharyngeal swab specimens and should not be used as a sole basis for treatment. Nasal washings and aspirates are unacceptable for Xpert Xpress SARS-CoV-2/FLU/RSV testing.  Fact Sheet for Patients: EntrepreneurPulse.com.au  Fact Sheet for Healthcare Providers: IncredibleEmployment.be  This test is not yet approved or cleared by the [place] FDA  and has been authorized for detection and/or diagnosis of SARS-CoV-2 by FDA under an Emergency Use Authorization (EUA). This EUA will remain in effect (meaning this test can be used) for the duration of the COVID-19 declaration under Section 564(b)(1) of the Act, 21 U.S.C. section 360bbb-3(b)(1), unless the authorization is terminated or revoked.  Performed at [facility], [place] [address], [place], [place] [address]      Patient was seen and examined on the day of discharge and was found to be in stable condition. Time coordinating discharge: 25 minutes including assessment and coordination of care, as well as examination of the patient.   SIGNED:  Dessa Phi, DO [facility] 01/24/2021, 9:27 AM

## 2021-01-24 NOTE — Addendum Note (Signed)
Addended by: Julious Payer A on: 01/24/2021 08:29 AM   Modules accepted: Orders

## 2021-01-24 NOTE — Plan of Care (Signed)

## 2021-01-25 ENCOUNTER — Telehealth: Payer: Self-pay | Admitting: Cardiology

## 2021-01-25 NOTE — Progress Notes (Signed)
Reach out to Psychiatry to request refill of aripiprazole 5 mg to [facility] to [facility] on 01/25/2021.  Per Psychiatry,  provider is only there on Tuesday and Thursday and she can not request it when she not there but she sent a email requesting the aripiprazole.  Reach out to Cardiology to request a refill of Carvedilol to go to [facility] on 01/25/2021.  Everlean Cherry Clinical Pharmacist Assistant [phone]

## 2021-01-25 NOTE — Telephone Encounter (Signed)
*  STAT* If patient is at the pharmacy, call can be transferred to refill team.   1. Which medications need to be refilled? (please list name of each medication and dose if known) carvedilol  2. Which pharmacy/location (including street and city if local pharmacy) is medication to be sent to? [facility]  3. Do they need a 30 day or 90 day supply? 90

## 2021-01-25 NOTE — Progress Notes (Incomplete)
[facility]  PCP: Arlester Marker Primary Cardiologist: Buford Dresser   HPI:  Kenneth [name] is a 60 y.o.  male  with a PMH significant for COPD with asthma on chronic prednisone 10 mg daily, chronic respiratory failure with hypoxia on 3 L supplemental O2 via [place] at all times, chronic combined systolic and diastolic CHF (EF recovered to 60-65%), paroxysmal atrial fibrillation on Eliquis, CKD stage IIIa, insulin-dependent type 2 diabetes, hypertension, hyperlipidemia, depression/anxiety, and OSA on CPAP  Majority of patient's medical history is centered around very severe COPD with asthma.  At least in our records he has had a steady decline since 2019 but severe COPD dating back to 2017. He is prone to recurrent exacerbations. .  Majority of admissions note COPD to be the primary cause of his respiratory failure. At outside hospital had [facility] July 2017 with  with normal coronary arteries. Ejection fraction by cath estimation was 35% at that time.  Started on medical therapy and EF recovered.  Last ECHO 09/2020 EF 55-60%  He has had multiple hospitalizations over the last several months and has been intubated on 2 separate occasions for acute on chronic respiratory failure with COPD exacerbation and decompensated heart failure. He also suffered an out of hospital PEA arrest with ROSC after 5 minutes of CPR and 1 round of epi.  On Daliresp. And chronic Prednisone 46m daily. Remains on oxygen 3 L . Followed by palliative care.   Recent admit for acute respiratory failure 2/2 COPD exacerbation and also received IV lasix 427mfor CHF although weight largely unchanged during admission. Chest x-ray was without cardiopulmonary disease, BNP was elevated to 700.  198>197lbs.  As with prior admissions primarily thought to be due to COPD exacerbation.     ROS: All systems negative except as listed in HPI, PMH and Problem List.  SH:  Social History    Socioeconomic History  . Marital status: Divorced    Spouse name: Not on file  . Number of children: 1  . Years of education: Not on file  . Highest education level: Associate degree: occupational, teHotel manageror vocational program  Occupational History  . Occupation: disabled    Comment: Disability $1100/mo  Tobacco Use  . Smoking status: Former Smoker    Packs/day: 2.50    Years: 30.00    Pack years: 75.00    Types: Cigarettes    Quit date: 06/2020    Years since quitting: 0.5  . Smokeless tobacco: Never Used  Vaping Use  . Vaping Use: Never used  Substance and Sexual Activity  . Alcohol use: Not Currently    Comment: pt stated that it was 6 months ago he drunk bourbon  . Drug use: Yes    Frequency: 1.0 times per week    Types: Marijuana    Comment: occ marijuana  . Sexual activity: Yes    Partners: Female    Birth control/protection: None  Other Topics Concern  . Not on file  Social History Narrative  . Not on file   Social Determinants of Health   Financial Resource Strain: Low Risk   . Difficulty of Paying Living Expenses: Not very hard  Food Insecurity: No Food Insecurity  . Worried About RuCharity fundraisern the Last Year: Never true  . Ran Out of Food in the Last Year: Never true  Transportation Needs: No Transportation Needs  . Lack of Transportation (Medical): No  . Lack of Transportation (Non-Medical): No  Physical Activity: Sufficiently Active  . Days of Exercise per Week: 2 days  . Minutes of Exercise per Session: 90 min  Stress: Stress Concern Present  . Feeling of Stress : To some extent  Social Connections: Not on file  Intimate Partner Violence: Not At Risk  . Fear of Current or Ex-Partner: No  . Emotionally Abused: No  . Physically Abused: No  . Sexually Abused: No    FH:  Family History  Problem Relation Age of Onset  . Diabetes Mother   . Heart disease Mother   . Diabetes Father   . Heart disease Father   . Diabetes Sister   .  Heart disease Sister   . Kidney disease Sister   . Coronary artery disease Brother   . Liver disease Maternal Uncle     Past Medical History:  Diagnosis Date  . Anxiety   . Arthritis   . Atrial fibrillation ([place])   . Chest tightness 06/12/2019  . CHF (congestive heart failure) ([place])   . COPD (chronic obstructive pulmonary disease) ([facility])    emphysema  . Depression   . Diabetes mellitus without complication ([place])   . Heart murmur   . Hyperlipidemia   . Hypertension   . Sleep apnea with use of continuous positive airway pressure (CPAP)     Current Outpatient Medications  Medication Sig Dispense Refill  . acetaminophen (TYLENOL) 325 MG tablet Take 650 mg by mouth every 6 (six) hours as needed for mild pain or headache.    . albuterol (PROVENTIL) (2.5 MG/3ML) 0.083% nebulizer solution Take 3 mLs (2.5 mg total) by nebulization every 6 (six) hours as needed for shortness of breath.    [name] amLODipine (NORVASC) 5 MG tablet Take 5 mg by mouth every morning.    . ARIPiprazole (ABILIFY) 5 MG tablet Take 7 mg by mouth daily.    . Blood Glucose Monitoring Suppl (ONE TOUCH ULTRA 2) w/Device KIT Use to test blood sugars 1-2 times daily. 1 kit 0  . busPIRone (BUSPAR) 15 MG tablet Take 1 tablet (15 mg total) by mouth 2 (two) times daily. 180 tablet 1  . carvedilol (COREG) 12.5 MG tablet Take 1 tablet (12.5 mg total) by mouth 2 (two) times daily with a meal. 180 tablet 3  . clotrimazole (MYCELEX) 10 MG troche Take 1 tablet (10 mg total) by mouth 5 (five) times daily. 35 Troche 0  . DM-GG & DM-APAP-CPM (CORICIDIN HBP DAY/NIGHT COLD) 10-20 &15-200-2 MG MISC Take 1 tablet by mouth every 12 (twelve) hours as needed (cold symptoms).    [name] ELIQUIS 5 MG TABS tablet TAKE ONE TABLET BY MOUTH EVERY MORNING and TAKE ONE TABLET BY MOUTH EVERY EVENING (Patient taking differently: Take 5 mg by mouth 2 (two) times daily.) 180 tablet 1  . famotidine (PEPCID) 20 MG tablet Take 20 mg by mouth daily.    [name] FLUoxetine  (PROZAC) 20 MG capsule Take 1 capsule (20 mg total) by mouth daily.  0  . Fluticasone-Umeclidin-Vilant (TRELEGY ELLIPTA) 100-62.5-25 MCG/INH AEPB Inhale 1 puff into the lungs daily. 60 each 5  . furosemide (LASIX) 40 MG tablet Take 1 tablet (40 mg total) by mouth 2 (two) times daily. 60 tablet 11  . gabapentin (NEURONTIN) 100 MG capsule Take 2 capsules (200 mg total) by mouth 3 (three) times daily. (Patient taking differently: Take 200 mg by mouth 3 (three) times daily as needed (pain).) 180 capsule 3  . glucose blood (ONETOUCH ULTRA) test strip USE TO TEST  BLOOD SUGAR 1-2 TIMES DAILY 100 each 11  . guaiFENesin (MUCINEX) 600 MG 12 hr tablet Take 1 tablet (600 mg total) by mouth 2 (two) times daily. 30 tablet 0  . hydrALAZINE (APRESOLINE) 50 MG tablet Take 1 tablet (50 mg total) by mouth 3 (three) times daily. 90 tablet 0  . insulin glargine (LANTUS SOLOSTAR) 100 UNIT/ML Solostar Pen Inject 55 Units into the skin at bedtime. If fasting blood sugars remain >150 for 1 week, increase insulin dose by 5 units. Maximum of 80 units. 15 mL 2  . Insulin Pen Needle (PEN NEEDLES) 31G X 6 MM MISC 1 application by Does not apply route at bedtime. 100 each 1  . ipratropium-albuterol (DUONEB) 0.5-2.5 (3) MG/3ML SOLN Take 3 mLs by nebulization every 6 (six) hours as needed. (Patient not taking: Reported on 01/22/2021) 360 mL 0  . isosorbide mononitrate (IMDUR) 30 MG 24 hr tablet Take 30 mg by mouth daily.    [name] ketoconazole (NIZORAL) 2 % cream Apply 1 application topically daily. 60 g 2  . Lancets (ONETOUCH ULTRASOFT) lancets USE TO TEST BLOOD SUGAR 1-2 TIMES DAILY 100 each 11  . lubiprostone ([name]) 8 MCG capsule Take 1 capsule (8 mcg total) by mouth 2 (two) times daily with a meal. 60 capsule 2  . Multiple Vitamin (MULTIVITAMIN WITH MINERALS) TABS tablet Take 1 tablet by mouth daily.    . Multiple Vitamins-Minerals (ONE-A-DAY MENS 50+) TABS Take 1 tablet by mouth daily.    . nitroGLYCERIN (NITROSTAT) 0.4 MG SL  tablet DISSOLVE 1 TABLET UNDER THE TONGUE EVERY 5 MINUTES AS&nbsp;&nbsp;NEEDED FOR CHEST PAIN. MAX&nbsp;&nbsp;OF 3 TABLETS IN 15 MINUTES. CALL 911 IF PAIN PERSISTS. (Patient taking differently: Place 0.4 mg under the tongue every 5 (five) minutes as needed for chest pain.) 75 tablet 4  . pantoprazole (PROTONIX) 40 MG tablet TAKE ONE TABLET BY MOUTH EVERY MORNING (Patient taking differently: Take 40 mg by mouth daily.) 30 tablet 4  . predniSONE (DELTASONE) 10 MG tablet Take 4 tablets (82m total) by mouth once daily for 3 days, then 3 tabs once daily for 3 days, then 2 tabs daily for 3 days, then 1 tab daily. 32 tablet 0  . pseudoephedrine (SUDAFED) 60 MG tablet Take 60 mg by mouth every 4 (four) hours as needed for congestion.    . Roflumilast (DALIRESP) 250 MCG TABS Take 250 mcg by mouth daily. 30 tablet 11  . rosuvastatin (CRESTOR) 20 MG tablet TAKE ONE TABLET BY MOUTH ONCE DAILY (Patient taking differently: No sig reported) 30 tablet 3  . spironolactone (ALDACTONE) 25 MG tablet Take 25 mg by mouth daily.     No current facility-administered medications for this visit.    There were no vitals filed for this visit.  PHYSICAL EXAM: ***   ECG   ASSESSMENT & PLAN: Chronic Combined Systolic and Diastolic CHF: NYHA Class  COPD:  PAF:  T2DM:  OSA:   Refer to Social Work:  PCP  Medications  Transportation   ETOH   Drug Abuse ITax inspectorto Pharmacy:  Refer to FDevelopment worker, community:  Refer to Home Health:   [facility]  Refer to General Cardiology Other    Follow up

## 2021-01-28 ENCOUNTER — Telehealth (HOSPITAL_COMMUNITY): Payer: Self-pay

## 2021-01-28 ENCOUNTER — Inpatient Hospital Stay (HOSPITAL_COMMUNITY)
Admission: EM | Admit: 2021-01-28 | Discharge: 2021-01-31 | DRG: 190 | Disposition: A | Discharge status: Home / Self Care | Payer: Medicare Other | Attending: Internal Medicine | Admitting: Internal Medicine

## 2021-01-28 ENCOUNTER — Telehealth: Payer: Self-pay

## 2021-01-28 DIAGNOSIS — E1169 Type 2 diabetes mellitus with other specified complication: Secondary | ICD-10-CM | POA: Diagnosis present

## 2021-01-28 DIAGNOSIS — F32A Depression, unspecified: Secondary | ICD-10-CM | POA: Diagnosis present

## 2021-01-28 DIAGNOSIS — N1831 Chronic kidney disease, stage 3a: Secondary | ICD-10-CM | POA: Diagnosis not present

## 2021-01-28 DIAGNOSIS — F419 Anxiety disorder, unspecified: Secondary | ICD-10-CM | POA: Diagnosis present

## 2021-01-28 DIAGNOSIS — Z79899 Other long term (current) drug therapy: Secondary | ICD-10-CM | POA: Diagnosis not present

## 2021-01-28 DIAGNOSIS — K219 Gastro-esophageal reflux disease without esophagitis: Secondary | ICD-10-CM | POA: Diagnosis not present

## 2021-01-28 DIAGNOSIS — Z841 Family history of disorders of kidney and ureter: Secondary | ICD-10-CM

## 2021-01-28 DIAGNOSIS — Z8249 Family history of ischemic heart disease and other diseases of the circulatory system: Secondary | ICD-10-CM | POA: Diagnosis not present

## 2021-01-28 DIAGNOSIS — I480 Paroxysmal atrial fibrillation: Secondary | ICD-10-CM | POA: Diagnosis present

## 2021-01-28 DIAGNOSIS — Z20822 Contact with and (suspected) exposure to covid-19: Secondary | ICD-10-CM | POA: Diagnosis not present

## 2021-01-28 DIAGNOSIS — E785 Hyperlipidemia, unspecified: Secondary | ICD-10-CM | POA: Diagnosis present

## 2021-01-28 DIAGNOSIS — I5042 Chronic combined systolic (congestive) and diastolic (congestive) heart failure: Secondary | ICD-10-CM | POA: Diagnosis present

## 2021-01-28 DIAGNOSIS — Z794 Long term (current) use of insulin: Secondary | ICD-10-CM | POA: Diagnosis not present

## 2021-01-28 DIAGNOSIS — G4733 Obstructive sleep apnea (adult) (pediatric): Secondary | ICD-10-CM | POA: Diagnosis present

## 2021-01-28 DIAGNOSIS — J441 Chronic obstructive pulmonary disease with (acute) exacerbation: Secondary | ICD-10-CM | POA: Diagnosis present

## 2021-01-28 DIAGNOSIS — I130 Hypertensive heart and chronic kidney disease with heart failure and stage 1 through stage 4 chronic kidney disease, or unspecified chronic kidney disease: Secondary | ICD-10-CM | POA: Diagnosis present

## 2021-01-28 DIAGNOSIS — R0602 Shortness of breath: Secondary | ICD-10-CM | POA: Diagnosis not present

## 2021-01-28 DIAGNOSIS — M7918 Myalgia, other site: Secondary | ICD-10-CM | POA: Diagnosis not present

## 2021-01-28 DIAGNOSIS — E1122 Type 2 diabetes mellitus with diabetic chronic kidney disease: Secondary | ICD-10-CM | POA: Diagnosis not present

## 2021-01-28 DIAGNOSIS — J9692 Respiratory failure, unspecified with hypercapnia: Secondary | ICD-10-CM

## 2021-01-28 DIAGNOSIS — Z7951 Long term (current) use of inhaled steroids: Secondary | ICD-10-CM | POA: Diagnosis not present

## 2021-01-28 DIAGNOSIS — Z87891 Personal history of nicotine dependence: Secondary | ICD-10-CM | POA: Diagnosis not present

## 2021-01-28 DIAGNOSIS — R21 Rash and other nonspecific skin eruption: Secondary | ICD-10-CM | POA: Diagnosis not present

## 2021-01-28 DIAGNOSIS — R0689 Other abnormalities of breathing: Secondary | ICD-10-CM | POA: Diagnosis not present

## 2021-01-28 DIAGNOSIS — I10 Essential (primary) hypertension: Secondary | ICD-10-CM | POA: Diagnosis not present

## 2021-01-28 DIAGNOSIS — J9621 Acute and chronic respiratory failure with hypoxia: Secondary | ICD-10-CM | POA: Diagnosis present

## 2021-01-28 DIAGNOSIS — Z833 Family history of diabetes mellitus: Secondary | ICD-10-CM

## 2021-01-28 DIAGNOSIS — J439 Emphysema, unspecified: Principal | ICD-10-CM | POA: Diagnosis present

## 2021-01-28 DIAGNOSIS — J9602 Acute respiratory failure with hypercapnia: Secondary | ICD-10-CM | POA: Diagnosis not present

## 2021-01-28 DIAGNOSIS — J80 Acute respiratory distress syndrome: Secondary | ICD-10-CM | POA: Diagnosis not present

## 2021-01-28 DIAGNOSIS — Z7901 Long term (current) use of anticoagulants: Secondary | ICD-10-CM | POA: Diagnosis not present

## 2021-01-28 DIAGNOSIS — R0603 Acute respiratory distress: Secondary | ICD-10-CM | POA: Diagnosis not present

## 2021-01-28 DIAGNOSIS — R61 Generalized hyperhidrosis: Secondary | ICD-10-CM | POA: Diagnosis not present

## 2021-01-28 DIAGNOSIS — E782 Mixed hyperlipidemia: Secondary | ICD-10-CM | POA: Diagnosis not present

## 2021-01-28 MED ORDER — CARVEDILOL 12.5 MG PO TABS
12.5000 mg | ORAL_TABLET | Freq: Two times a day (BID) | ORAL | 0 refills | Status: DC
Start: 2021-01-28 — End: 2021-02-19

## 2021-01-28 MED ORDER — LUBIPROSTONE 8 MCG PO CAPS: 8.0000 ug | ORAL_CAPSULE | Freq: Two times a day (BID) | ORAL | 2 refills | Status: AC

## 2021-01-28 NOTE — Addendum Note (Signed)
Addended by: Julious Payer A on: 01/28/2021 10:36 AM   Modules accepted: Orders

## 2021-01-28 NOTE — Telephone Encounter (Signed)
Heart Failure Nurse Navigator Progress Note  Pt called regarding transportation for HV TOC appt 5/10. Requested best phone number changed to [phone]  Navigator confirmed with [facility] previously arranged. Pick up time relayed back to patient (8:15AM 5/10).   Ozella Rocks, RN, BSN Heart Failure Nurse Navigator [phone]

## 2021-01-28 NOTE — ED Triage Notes (Signed)
Pt comes via [facility], c/o SOB for the past 2 hour, resp distress on CPAP, rales in all lobes, hypertensive, PTA two nitro, 5 mg albuterol, hx of CHF

## 2021-01-28 NOTE — Progress Notes (Signed)
Patient states he did not receive his test strips or Abilify  with his delivery. Notified Clinical Pharmacist and [facility].  Per [facility],the test strips were too soon when I tried to fill them.The rx that we've got on file says 1-2 times a day.Patient Abilify needs prior authorization.   Patient states he checks his blood sugar four times daily.Informed patient I will notified clinical pharmacist to get a new [facility].  Everlean Cherry Clinical Pharmacist Assistant [phone]

## 2021-01-28 NOTE — Telephone Encounter (Signed)
Transition Care Management Follow-up Telephone Call  Date of discharge and from where: 01/24/2021-[facility]  How have you been since you were released from the hospital? Doing ok-much better per caregiver  Any questions or concerns? No  Items Reviewed:  Did the pt receive and understand the discharge instructions provided? Yes   Medications obtained and verified? Yes   Other? Yes   Any new allergies since your discharge? No   Dietary orders reviewed? Yes  Do you have support at home? Yes   Home Care and Equipment/Supplies: Were home health services ordered? no If so, what is the name of the agency? n/a  Has the agency set up a time to come to the patient's home? not applicable Were any new equipment or medical supplies ordered?  No What is the name of the medical supply agency? n/a Were you able to get the supplies/equipment? not applicable Do you have any questions related to the use of the equipment or supplies? n/a  Functional Questionnaire: (I = Independent and D = Dependent) ADLs: I  Bathing/Dressing- I  Meal Prep- I with assistance   Eating- I  Maintaining continence- I  Transferring/Ambulation- I  Managing Meds- I  Follow up appointments reviewed:   PCP Hospital f/u appt confirmed? Yes  Scheduled to see Dr. Veto Kemps on 01/30/21 @ 9:00.  Specialist Hospital f/u appt confirmed? Yes  Scheduled to see Jodelle Red on 02/07/2021 @ 9:00.  Are transportation arrangements needed? No   If their condition worsens, is the pt aware to call PCP or go to the Emergency Dept.? Yes  Was the patient provided with contact information for the PCP's office or ED? Yes  Was to pt encouraged to call back with questions or concerns? Yes

## 2021-01-29 ENCOUNTER — Encounter (HOSPITAL_COMMUNITY): Payer: Self-pay | Admitting: Student

## 2021-01-29 ENCOUNTER — Emergency Department (HOSPITAL_COMMUNITY): Payer: Medicare Other

## 2021-01-29 ENCOUNTER — Encounter (HOSPITAL_COMMUNITY): Payer: Medicare Other

## 2021-01-29 ENCOUNTER — Other Ambulatory Visit: Payer: Self-pay

## 2021-01-29 DIAGNOSIS — I5042 Chronic combined systolic (congestive) and diastolic (congestive) heart failure: Secondary | ICD-10-CM

## 2021-01-29 DIAGNOSIS — E782 Mixed hyperlipidemia: Secondary | ICD-10-CM

## 2021-01-29 DIAGNOSIS — J441 Chronic obstructive pulmonary disease with (acute) exacerbation: Secondary | ICD-10-CM | POA: Diagnosis present

## 2021-01-29 DIAGNOSIS — N1831 Chronic kidney disease, stage 3a: Secondary | ICD-10-CM | POA: Diagnosis not present

## 2021-01-29 DIAGNOSIS — Z794 Long term (current) use of insulin: Secondary | ICD-10-CM

## 2021-01-29 DIAGNOSIS — E1122 Type 2 diabetes mellitus with diabetic chronic kidney disease: Secondary | ICD-10-CM

## 2021-01-29 DIAGNOSIS — K219 Gastro-esophageal reflux disease without esophagitis: Secondary | ICD-10-CM | POA: Diagnosis not present

## 2021-01-29 DIAGNOSIS — I480 Paroxysmal atrial fibrillation: Secondary | ICD-10-CM

## 2021-01-29 DIAGNOSIS — E1169 Type 2 diabetes mellitus with other specified complication: Secondary | ICD-10-CM

## 2021-01-29 LAB — CBC WITH DIFFERENTIAL/PLATELET
Abs Immature Granulocytes: 0.06 10*3/uL (ref 0.00–0.07)
Basophils Absolute: 0 10*3/uL (ref 0.0–0.1)
Basophils Relative: 0 %
Eosinophils Absolute: 0.1 10*3/uL (ref 0.0–0.5)
Eosinophils Relative: 1 %
HCT: 34.3 % — ABNORMAL LOW (ref 39.0–52.0)
Hemoglobin: 10.1 g/dL — ABNORMAL LOW (ref 13.0–17.0)
Immature Granulocytes: 1 %
Lymphocytes Relative: 17 %
Lymphs Abs: 1.7 10*3/uL (ref 0.7–4.0)
MCH: 27.1 pg (ref 26.0–34.0)
MCHC: 29.4 g/dL — ABNORMAL LOW (ref 30.0–36.0)
MCV: 92 fL (ref 80.0–100.0)
Monocytes Absolute: 1 10*3/uL (ref 0.1–1.0)
Monocytes Relative: 10 %
Neutro Abs: 7.3 10*3/uL (ref 1.7–7.7)
Neutrophils Relative %: 71 %
Platelets: 292 10*3/uL (ref 150–400)
RBC: 3.73 MIL/uL — ABNORMAL LOW (ref 4.22–5.81)
RDW: 13 % (ref 11.5–15.5)
WBC: 10.2 10*3/uL (ref 4.0–10.5)
nRBC: 0.2 % (ref 0.0–0.2)

## 2021-01-29 LAB — COMPREHENSIVE METABOLIC PANEL
ALT: 35 U/L (ref 0–44)
AST: 19 U/L (ref 15–41)
Albumin: 3.6 g/dL (ref 3.5–5.0)
Alkaline Phosphatase: 76 U/L (ref 38–126)
Anion gap: 9 (ref 5–15)
BUN: 12 mg/dL (ref 6–20)
CO2: 42 mmol/L — ABNORMAL HIGH (ref 22–32)
Calcium: 9.1 mg/dL (ref 8.9–10.3)
Chloride: 88 mmol/L — ABNORMAL LOW (ref 98–111)
Creatinine, Ser: 1.44 mg/dL — ABNORMAL HIGH (ref 0.61–1.24)
GFR, Estimated: 56 mL/min — ABNORMAL LOW (ref >60)
Glucose, Bld: 141 mg/dL — ABNORMAL HIGH (ref 70–99)
Potassium: 3 mmol/L — ABNORMAL LOW (ref 3.5–5.1)
Sodium: 139 mmol/L (ref 135–145)
Total Bilirubin: 0.6 mg/dL (ref 0.3–1.2)
Total Protein: 6.2 g/dL — ABNORMAL LOW (ref 6.5–8.1)

## 2021-01-29 LAB — I-STAT ARTERIAL BLOOD GAS, ED
Acid-Base Excess: 22 mmol/L — ABNORMAL HIGH (ref 0.0–2.0)
Bicarbonate: 50.4 mmol/L — ABNORMAL HIGH (ref 20.0–28.0)
Calcium, Ion: 1.21 mmol/L (ref 1.15–1.40)
HCT: 32 % — ABNORMAL LOW (ref 39.0–52.0)
Hemoglobin: 10.9 g/dL — ABNORMAL LOW (ref 13.0–17.0)
O2 Saturation: 100 %
Patient temperature: 36.6
Potassium: 2.8 mmol/L — ABNORMAL LOW (ref 3.5–5.1)
Sodium: 140 mmol/L (ref 135–145)
TCO2: >50 mmol/L — ABNORMAL HIGH (ref 22–32)
pCO2 arterial: 76.9 mmHg (ref 32.0–48.0)
pH, Arterial: 7.423 (ref 7.350–7.450)
pO2, Arterial: 254 mmHg — ABNORMAL HIGH (ref 83.0–108.0)

## 2021-01-29 LAB — MRSA PCR SCREENING: MRSA by PCR: NEGATIVE

## 2021-01-29 LAB — GLUCOSE, CAPILLARY
Glucose-Capillary: 194 mg/dL — ABNORMAL HIGH (ref 70–99)
Glucose-Capillary: 312 mg/dL — ABNORMAL HIGH (ref 70–99)
Glucose-Capillary: 260 mg/dL — ABNORMAL HIGH (ref 70–99)
Glucose-Capillary: 106 mg/dL — ABNORMAL HIGH (ref 70–99)

## 2021-01-29 LAB — RESP PANEL BY RT-PCR (FLU A&B, COVID) ARPGX2
Influenza A by PCR: NEGATIVE
Influenza B by PCR: NEGATIVE
SARS Coronavirus 2 by RT PCR: NEGATIVE

## 2021-01-29 LAB — HIV ANTIBODY (ROUTINE TESTING W REFLEX): HIV Screen 4th Generation wRfx: NONREACTIVE

## 2021-01-29 LAB — BRAIN NATRIURETIC PEPTIDE: B Natriuretic Peptide: 432.5 pg/mL — ABNORMAL HIGH (ref 0.0–100.0)

## 2021-01-29 LAB — TROPONIN I (HIGH SENSITIVITY)
Troponin I (High Sensitivity): 38 ng/L — ABNORMAL HIGH (ref <18)
Troponin I (High Sensitivity): 42 ng/L — ABNORMAL HIGH (ref <18)

## 2021-01-29 MED ORDER — IPRATROPIUM-ALBUTEROL 0.5-2.5 (3) MG/3ML IN SOLN
3.0000 mL | Freq: Four times a day (QID) | RESPIRATORY_TRACT | Status: DC
  Administered 2021-01-29 (×4): 3 mL via RESPIRATORY_TRACT
  Filled 2021-01-29 (×3): qty 3

## 2021-01-29 MED ORDER — ISOSORBIDE MONONITRATE ER 30 MG PO TB24
30.0000 mg | ORAL_TABLET | Freq: Every day | ORAL | Status: DC
  Administered 2021-01-29 – 2021-01-31 (×3): 30 mg via ORAL
  Filled 2021-01-29 (×3): qty 1

## 2021-01-29 MED ORDER — HYDRALAZINE HCL 25 MG PO TABS
25.0000 mg | ORAL_TABLET | Freq: Three times a day (TID) | ORAL | Status: DC
  Administered 2021-01-29 – 2021-01-31 (×7): 25 mg via ORAL
  Filled 2021-01-29 (×7): qty 1

## 2021-01-29 MED ORDER — INSULIN GLARGINE 100 UNIT/ML ~~LOC~~ SOLN
55.0000 [IU] | Freq: Every day | SUBCUTANEOUS | Status: DC
  Administered 2021-01-29 – 2021-01-30 (×2): 55 [IU] via SUBCUTANEOUS
  Filled 2021-01-29 (×3): qty 0.55

## 2021-01-29 MED ORDER — IPRATROPIUM-ALBUTEROL 0.5-2.5 (3) MG/3ML IN SOLN
3.0000 mL | Freq: Three times a day (TID) | RESPIRATORY_TRACT | Status: DC
  Administered 2021-01-30 – 2021-01-31 (×4): 3 mL via RESPIRATORY_TRACT
  Filled 2021-01-29 (×4): qty 3

## 2021-01-29 MED ORDER — NITROGLYCERIN 0.4 MG SL SUBL: 0.4000 mg | SUBLINGUAL_TABLET | SUBLINGUAL | Status: DC | PRN

## 2021-01-29 MED ORDER — HYDRALAZINE HCL 20 MG/ML IJ SOLN: 10.0000 mg | Freq: Four times a day (QID) | INTRAMUSCULAR | Status: DC | PRN

## 2021-01-29 MED ORDER — POTASSIUM CHLORIDE CRYS ER 20 MEQ PO TBCR
40.0000 meq | EXTENDED_RELEASE_TABLET | Freq: Three times a day (TID) | ORAL | Status: AC
  Administered 2021-01-29 (×3): 40 meq via ORAL
  Filled 2021-01-29 (×3): qty 2

## 2021-01-29 MED ORDER — FLUOXETINE HCL 20 MG PO CAPS
60.0000 mg | ORAL_CAPSULE | Freq: Every day | ORAL | Status: DC
  Administered 2021-01-29 – 2021-01-31 (×3): 60 mg via ORAL
  Filled 2021-01-29 (×3): qty 3

## 2021-01-29 MED ORDER — FLUTICASONE-UMECLIDIN-VILANT 100-62.5-25 MCG/INH IN AEPB: 1.0000 | INHALATION_SPRAY | Freq: Every day | RESPIRATORY_TRACT | Status: DC

## 2021-01-29 MED ORDER — ARIPIPRAZOLE 5 MG PO TABS
7.0000 mg | ORAL_TABLET | Freq: Every day | ORAL | Status: DC
  Administered 2021-01-29 – 2021-01-31 (×3): 7 mg via ORAL
  Filled 2021-01-29 (×3): qty 1

## 2021-01-29 MED ORDER — FAMOTIDINE 20 MG PO TABS
20.0000 mg | ORAL_TABLET | Freq: Every day | ORAL | Status: DC
  Administered 2021-01-29 – 2021-01-31 (×3): 20 mg via ORAL
  Filled 2021-01-29 (×3): qty 1

## 2021-01-29 MED ORDER — ROFLUMILAST 500 MCG PO TABS
250.0000 ug | ORAL_TABLET | Freq: Every day | ORAL | Status: DC
  Administered 2021-01-29 – 2021-01-31 (×3): 250 ug via ORAL
  Filled 2021-01-29 (×3): qty 1

## 2021-01-29 MED ORDER — IPRATROPIUM-ALBUTEROL 0.5-2.5 (3) MG/3ML IN SOLN
3.0000 mL | RESPIRATORY_TRACT | Status: DC | PRN
  Filled 2021-01-29: qty 3

## 2021-01-29 MED ORDER — ALUM & MAG HYDROXIDE-SIMETH 200-200-20 MG/5ML PO SUSP
30.0000 mL | Freq: Four times a day (QID) | ORAL | Status: DC | PRN
  Administered 2021-01-29 – 2021-01-30 (×2): 30 mL via ORAL
  Filled 2021-01-29 (×2): qty 30

## 2021-01-29 MED ORDER — ONETOUCH ULTRA VI STRP: ORAL_STRIP | 11 refills | Status: DC

## 2021-01-29 MED ORDER — GLUCERNA SHAKE PO LIQD
237.0000 mL | Freq: Two times a day (BID) | ORAL | Status: DC
  Administered 2021-01-30 (×2): 237 mL via ORAL

## 2021-01-29 MED ORDER — FLUTICASONE FUROATE-VILANTEROL 100-25 MCG/INH IN AEPB
1.0000 | INHALATION_SPRAY | Freq: Every day | RESPIRATORY_TRACT | Status: DC
  Administered 2021-01-29 – 2021-01-31 (×3): 1 via RESPIRATORY_TRACT
  Filled 2021-01-29: qty 28

## 2021-01-29 MED ORDER — LUBIPROSTONE 8 MCG PO CAPS
8.0000 ug | ORAL_CAPSULE | Freq: Two times a day (BID) | ORAL | Status: DC
  Administered 2021-01-29 – 2021-01-31 (×5): 8 ug via ORAL
  Filled 2021-01-29 (×6): qty 1

## 2021-01-29 MED ORDER — AMLODIPINE BESYLATE 5 MG PO TABS
5.0000 mg | ORAL_TABLET | Freq: Every morning | ORAL | Status: DC
  Administered 2021-01-29 – 2021-01-31 (×3): 5 mg via ORAL
  Filled 2021-01-29 (×2): qty 1

## 2021-01-29 MED ORDER — IPRATROPIUM-ALBUTEROL 0.5-2.5 (3) MG/3ML IN SOLN
3.0000 mL | Freq: Once | RESPIRATORY_TRACT | Status: AC
  Administered 2021-01-29: 3 mL via RESPIRATORY_TRACT
  Filled 2021-01-29: qty 3

## 2021-01-29 MED ORDER — ADULT MULTIVITAMIN W/MINERALS CH
1.0000 | ORAL_TABLET | Freq: Every day | ORAL | Status: DC
  Administered 2021-01-29 – 2021-01-31 (×3): 1 via ORAL
  Filled 2021-01-29 (×3): qty 1

## 2021-01-29 MED ORDER — APIXABAN 5 MG PO TABS
5.0000 mg | ORAL_TABLET | Freq: Two times a day (BID) | ORAL | Status: DC
  Administered 2021-01-29 – 2021-01-31 (×5): 5 mg via ORAL
  Filled 2021-01-29 (×5): qty 1

## 2021-01-29 MED ORDER — METHYLPREDNISOLONE SODIUM SUCC 125 MG IJ SOLR
125.0000 mg | Freq: Once | INTRAMUSCULAR | Status: AC
  Administered 2021-01-29: 125 mg via INTRAVENOUS
  Filled 2021-01-29: qty 2

## 2021-01-29 MED ORDER — SPIRONOLACTONE 25 MG PO TABS
25.0000 mg | ORAL_TABLET | Freq: Every day | ORAL | Status: DC
  Administered 2021-01-29 – 2021-01-31 (×3): 25 mg via ORAL
  Filled 2021-01-29 (×4): qty 1

## 2021-01-29 MED ORDER — ROSUVASTATIN CALCIUM 20 MG PO TABS
20.0000 mg | ORAL_TABLET | Freq: Every day | ORAL | Status: DC
  Administered 2021-01-29 – 2021-01-31 (×3): 20 mg via ORAL
  Filled 2021-01-29 (×3): qty 1

## 2021-01-29 MED ORDER — METHYLPREDNISOLONE SODIUM SUCC 125 MG IJ SOLR
60.0000 mg | Freq: Two times a day (BID) | INTRAMUSCULAR | Status: DC
  Administered 2021-01-29 – 2021-01-30 (×3): 60 mg via INTRAVENOUS
  Filled 2021-01-29 (×3): qty 2

## 2021-01-29 MED ORDER — ASPIRIN 81 MG PO CHEW: 324.0000 mg | CHEWABLE_TABLET | Freq: Once | ORAL | Status: DC

## 2021-01-29 MED ORDER — GUAIFENESIN ER 600 MG PO TB12
600.0000 mg | ORAL_TABLET | Freq: Two times a day (BID) | ORAL | Status: DC
  Administered 2021-01-29 – 2021-01-31 (×5): 600 mg via ORAL
  Filled 2021-01-29 (×5): qty 1

## 2021-01-29 MED ORDER — SODIUM CHLORIDE 0.9 % IV SOLN
100.0000 mg | Freq: Two times a day (BID) | INTRAVENOUS | Status: DC
  Administered 2021-01-29 – 2021-01-30 (×3): 100 mg via INTRAVENOUS
  Filled 2021-01-29 (×3): qty 100

## 2021-01-29 MED ORDER — CARVEDILOL 12.5 MG PO TABS
12.5000 mg | ORAL_TABLET | Freq: Two times a day (BID) | ORAL | Status: DC
  Administered 2021-01-29 – 2021-01-31 (×5): 12.5 mg via ORAL
  Filled 2021-01-29 (×5): qty 1

## 2021-01-29 MED ORDER — PANTOPRAZOLE SODIUM 40 MG PO TBEC
40.0000 mg | DELAYED_RELEASE_TABLET | Freq: Every day | ORAL | Status: DC
  Administered 2021-01-29 – 2021-01-31 (×3): 40 mg via ORAL
  Filled 2021-01-29 (×3): qty 1

## 2021-01-29 MED ORDER — BUSPIRONE HCL 15 MG PO TABS
15.0000 mg | ORAL_TABLET | Freq: Two times a day (BID) | ORAL | Status: DC
  Administered 2021-01-29 – 2021-01-31 (×5): 15 mg via ORAL
  Filled 2021-01-29 (×5): qty 1

## 2021-01-29 MED ORDER — WITCH HAZEL-GLYCERIN EX PADS
MEDICATED_PAD | CUTANEOUS | Status: DC | PRN
  Filled 2021-01-29: qty 100

## 2021-01-29 MED ORDER — INSULIN ASPART 100 UNIT/ML IJ SOLN
0.0000 [IU] | Freq: Three times a day (TID) | INTRAMUSCULAR | Status: DC
  Administered 2021-01-29: 15 [IU] via SUBCUTANEOUS
  Administered 2021-01-29: 4 [IU] via SUBCUTANEOUS
  Administered 2021-01-29: 11 [IU] via SUBCUTANEOUS
  Administered 2021-01-30: 7 [IU] via SUBCUTANEOUS
  Administered 2021-01-30 (×2): 4 [IU] via SUBCUTANEOUS

## 2021-01-29 MED ORDER — FUROSEMIDE 40 MG PO TABS
40.0000 mg | ORAL_TABLET | Freq: Two times a day (BID) | ORAL | Status: DC
  Administered 2021-01-29 – 2021-01-31 (×5): 40 mg via ORAL
  Filled 2021-01-29 (×5): qty 1

## 2021-01-29 MED ORDER — UMECLIDINIUM BROMIDE 62.5 MCG/INH IN AEPB
1.0000 | INHALATION_SPRAY | Freq: Every day | RESPIRATORY_TRACT | Status: DC
  Administered 2021-01-29 – 2021-01-31 (×3): 1 via RESPIRATORY_TRACT
  Filled 2021-01-29: qty 7

## 2021-01-29 MED ORDER — GABAPENTIN 100 MG PO CAPS: 200.0000 mg | ORAL_CAPSULE | Freq: Three times a day (TID) | ORAL | Status: DC | PRN

## 2021-01-29 NOTE — Progress Notes (Signed)
Agree with plan as stated by the admitting physician, initially on BiPAP now has been weaned to room air. Continue IV steroids, inhalers and antibiotics. I have examined the patient he looks comfortable tolerating his diet. He still wheezing on physical exam but his respiration rate has slowed down he relates he feels better than when he came in. We will continue treatment regimen.

## 2021-01-29 NOTE — Progress Notes (Signed)
   Pt is active with [facility], the home-based [facility] division of [facility].  He receives SN and MSW support at home.  Will plan to resume [facility] support upon d/c back home.  Please contact [facility] if we can assist with d/c planning.  Thank you Julius Bowels, RN  Office [phone] Mobile [phone]

## 2021-01-29 NOTE — H&P (Signed)
History and Physical    Kenneth [name] AOZ:[id number] DOB: 05-26-1961 DOA: 01/28/2021  PCP: [name], MD  Patient coming from: Home via EMS   Chief Complaint:  Chief Complaint  Patient presents with  . Respiratory Distress     HPI:    60 year old male with past medical history of COPD and asthma (PFT's 04/2020 FEV1 25%), chronic respiratory failure (on 3LPM via [place]), systolic and diastolic congestive heart failure (Echo 09/2020 EF 55-60%), atrial fibrillation on Eliquis, chronic kidney disease stage IIIa, insulin-dependent diabetes mellitus type 2, hypertension, hyperlipidemia, obstructive sleep apnea on CPAP who presents to [facility] emergency department via EMS with shortness of breath.  Of note, patient was recently admitted at [facility] from 5/3 until 5/5.  Patient was admitted for a COPD/asthma exacerbation.  Patient was managed with BiPAP initially and later transitioned to nasal cannula.  Patient was treated with intravenous steroids and aggressive bronchodilator therapy.  Patient was discharged on 5/5.  Patient explains that he was taking all of his medications as instructed since his discharge from the hospital 5/5.  Patient states that on the date of discharge he felt that he was back at baseline.  Patient explains that the evening of 5/9, patient walked to the bathroom and had a large bowel movement.  Patient became quite winded during this bowel movement and upon walking back to the bedroom became progressively more short of breath.  Shortness of breath became severe in intensity and did not seem to subside even with rest.  Patient complains of associated chest discomfort with deep inspiration.  Patient denies any change in his baseline cough.  Patient denies any fevers, sick contacts, recent travel or contact with confirmed COVID-19 infection.  Patient reports using all prescribed bronchodilators as instructed.   Due to these progressively worsening symptoms EMS was  contacted.  Upon prompt EMS arrival patient was found to be in respiratory distress.  In route to the hospital, patient was placed on CPAP after 2 rounds of sublingual nitroglycerin and albuterol were administered.  Upon evaluation in the emergency department patient was transitioned to BiPAP therapy.  ABG performed while patient was on BiPAP revealed a pH of 7.42, PCO2 of 76.9 and PaO2 of 254.  Chest x-ray was performed revealing no evidence of acute infiltrate.  BNP was found to be elevated at 432 although this was much lower than what his BNP was during the previous hospitalization.  Patient was felt to be suffering from an ongoing recurrent bout of COPD and asthma and therefore the hospitalist group was called to assess the patient for admission to hospital.  Review of Systems:   Review of Systems  Respiratory: Positive for shortness of breath and wheezing.     Past Medical History:  Diagnosis Date  . Anxiety   . Arthritis   . Atrial fibrillation ([place])   . Chest tightness 06/12/2019  . CHF (congestive heart failure) ([place])   . COPD (chronic obstructive pulmonary disease) (HCC)    emphysema  . Depression   . Diabetes mellitus without complication ([place])   . Heart murmur   . Hyperlipidemia   . Hypertension   . Sleep apnea with use of continuous positive airway pressure (CPAP)     Past Surgical History:  Procedure Laterality Date  . NO PAST SURGERIES       reports that he quit smoking about 7 months ago. His smoking use included cigarettes. He has a 75.00 pack-year smoking history. He has never used  smokeless tobacco. He reports previous alcohol use. He reports current drug use. Frequency: 1.00 time per week. Drug: Marijuana.  Allergies  Allergen Reactions  . Lisinopril Swelling  . Other Other (See Comments)    Lettuce : rash  . Tomato Rash    Family History  Problem Relation Age of Onset  . Diabetes Mother   . Heart disease Mother   . Diabetes Father   . Heart disease  Father   . Diabetes Sister   . Heart disease Sister   . Kidney disease Sister   . Coronary artery disease Brother   . Liver disease Maternal Uncle      Prior to Admission medications   Medication Sig Start Date End Date Taking? Authorizing Provider  acetaminophen (TYLENOL) 325 MG tablet Take 650 mg by mouth every 6 (six) hours as needed for mild pain or headache.   Yes [provider]  albuterol (PROVENTIL) (2.5 MG/3ML) 0.083% nebulizer solution Take 3 mLs (2.5 mg total) by nebulization every 6 (six) hours as needed for shortness of breath. 12/25/20  Yes Aline August, MD  amLODipine (NORVASC) 5 MG tablet Take 5 mg by mouth every morning. 10/30/20  Yes [provider]  ARIPiprazole (ABILIFY) 5 MG tablet Take 7 mg by mouth daily.   Yes [provider]  Blood Glucose Monitoring Suppl (ONE TOUCH ULTRA 2) w/Device KIT Use to test blood sugars 1-2 times daily. 04/02/20  Yes [name], MD  busPIRone (BUSPAR) 15 MG tablet Take 1 tablet (15 mg total) by mouth 2 (two) times daily. 09/06/19  Yes [name], MD  carvedilol (COREG) 12.5 MG tablet Take 1 tablet (12.5 mg total) by mouth 2 (two) times daily with a meal. 01/28/21  Yes Buford Dresser, MD  clotrimazole (MYCELEX) 10 MG troche Take 1 tablet (10 mg total) by mouth 5 (five) times daily. 12/07/20  Yes Parrett, Tammy S, NP  DM-GG & DM-APAP-CPM (CORICIDIN HBP DAY/NIGHT COLD) 10-20 &15-200-2 MG MISC Take 1 tablet by mouth every 12 (twelve) hours as needed (cold symptoms).   Yes [provider]  ELIQUIS 5 MG TABS tablet TAKE ONE TABLET BY MOUTH EVERY MORNING and TAKE ONE TABLET BY MOUTH EVERY EVENING Patient taking differently: Take 5 mg by mouth 2 (two) times daily. 10/22/20  Yes Buford Dresser, MD  famotidine (PEPCID) 20 MG tablet Take 20 mg by mouth daily.   Yes [provider]  FLUoxetine (PROZAC) 20 MG capsule Take 1 capsule (20 mg total) by mouth daily. Patient taking  differently: Take 60 mg by mouth daily. 01/25/21  Yes Dessa Phi, DO  Fluticasone-Umeclidin-Vilant (TRELEGY ELLIPTA) 100-62.5-25 MCG/INH AEPB Inhale 1 puff into the lungs daily. 04/02/20  Yes Collene Gobble, MD  furosemide (LASIX) 40 MG tablet Take 1 tablet (40 mg total) by mouth 2 (two) times daily. 08/02/20  Yes Buford Dresser, MD  gabapentin (NEURONTIN) 100 MG capsule Take 2 capsules (200 mg total) by mouth 3 (three) times daily. Patient taking differently: Take 200 mg by mouth 3 (three) times daily as needed (pain). 11/22/20  Yes [name], MD  glucose blood [facility] ULTRA) test strip USE TO TEST BLOOD SUGAR 1-2 TIMES DAILY 05/22/20  Yes [name], MD  guaiFENesin (MUCINEX) 600 MG 12 hr tablet Take 1 tablet (600 mg total) by mouth 2 (two) times daily. 09/24/20  Yes Regalado, Belkys A, MD  hydrALAZINE (APRESOLINE) 25 MG tablet Take 25 mg by mouth 3 (three) times daily. 01/25/21  Yes [provider]  insulin glargine (LANTUS SOLOSTAR) 100 UNIT/ML Solostar Pen Inject 55 Units into the skin at bedtime. If fasting blood sugars remain >150 for 1 week, increase insulin dose by 5 units. Maximum of 80 units. 12/19/20  Yes [name], MD  Insulin Pen Needle (PEN NEEDLES) 31G X 6 MM MISC 1 application by Does not apply route at bedtime. 11/03/20  Yes Nche, Charlene Brooke, NP  isosorbide mononitrate (IMDUR) 30 MG 24 hr tablet Take 30 mg by mouth daily. 07/04/20  Yes [provider]  ketoconazole (NIZORAL) 2 % cream Apply 1 application topically daily. 01/15/21  Yes McDonald, Stephan Minister, DPM  Lancets [facility] ULTRASOFT) lancets USE TO TEST BLOOD SUGAR 1-2 TIMES DAILY 05/22/20  Yes [name], MD  lubiprostone [facility]) 8 MCG capsule Take 1 capsule (8 mcg total) by mouth 2 (two) times daily with a meal. 01/28/21  Yes [name], MD  Multiple Vitamin (MULTIVITAMIN WITH MINERALS) TABS tablet Take 1 tablet by mouth daily.   Yes [provider]  Multiple  Vitamins-Minerals (ONE-A-DAY MENS 50+) TABS Take 1 tablet by mouth daily.   Yes [provider]  nitroGLYCERIN (NITROSTAT) 0.4 MG SL tablet DISSOLVE 1 TABLET UNDER THE TONGUE EVERY 5 MINUTES AS&nbsp;&nbsp;NEEDED FOR CHEST PAIN. MAX&nbsp;&nbsp;OF 3 TABLETS IN 15 MINUTES. CALL 911 IF PAIN PERSISTS. Patient taking differently: Place 0.4 mg under the tongue every 5 (five) minutes as needed for chest pain. 07/25/20  Yes [name], MD  pantoprazole (PROTONIX) 40 MG tablet TAKE ONE TABLET BY MOUTH EVERY MORNING Patient taking differently: Take 40 mg by mouth daily. 10/29/20  Yes Esterwood, Amy S, PA-C  predniSONE (DELTASONE) 10 MG tablet Take 4 tablets (68m total) by mouth once daily for 3 days, then 3 tabs once daily for 3 days, then 2 tabs daily for 3 days, then 1 tab daily. Patient taking differently: Take 4 tablets (460mtotal) by mouth once daily for 3 days, then 3 tabs once daily for 3 days, then 2 tabs daily for 3 days, then 1 tab daily. 01/24/21  Yes ChDessa PhiDO  pseudoephedrine (SUDAFED) 60 MG tablet Take 60 mg by mouth every 4 (four) hours as needed for congestion.   Yes [provider]  Roflumilast (DALIRESP) 250 MCG TABS Take 250 mcg by mouth daily. 11/06/20  Yes Parrett, Tammy S, NP  rosuvastatin (CRESTOR) 20 MG tablet TAKE ONE TABLET BY MOUTH ONCE DAILY Patient taking differently: Take 20 mg by mouth daily. 10/29/20  Yes WeDutch Quint, FNP  spironolactone (ALDACTONE) 25 MG tablet Take 25 mg by mouth daily.   Yes [provider]  ipratropium-albuterol (DUONEB) 0.5-2.5 (3) MG/3ML SOLN Take 3 mLs by nebulization every 6 (six) hours as needed. 09/24/20   ReElmarie ShileyMD    Physical Exam: Vitals:   01/29/21 0815 01/29/21 0826 01/29/21 0829 01/29/21 0832  BP: (!) 163/89     Pulse: 87     Resp:      Temp:      TempSrc:      SpO2:  100% 100% 100%  Weight:      Height:        Constitutional: Awake alert and oriented x3, patient is currently on  BiPAP in mild respiratory distress. Skin: no rashes, no lesions, good skin turgor noted. Eyes: Pupils are equally reactive to light.  No evidence of scleral icterus or conjunctival pallor.  ENMT: BiPAP mask in place, moist mucous membranes noted.  Posterior pharynx clear  of any exudate or lesions.   Neck: normal, supple, no masses, no thyromegaly.  No evidence of jugular venous distension.   Respiratory: Markedly diminished breath sounds in all fields with faint expiratory wheezing that is intermittent with prolonged expiratory phase.  No evidence of rales.  Patient is exhibiting respiratory distress without evidence of accessory muscle use.   Cardiovascular: Regular rate and rhythm, no murmurs / rubs / gallops. No extremity edema. 2+ pedal pulses. No carotid bruits.  Chest:   Nontender without crepitus or deformity.   Back:   Nontender without crepitus or deformity. Abdomen: Abdomen is soft and nontender.  No evidence of intra-abdominal masses.  Positive bowel sounds noted in all quadrants.   Musculoskeletal: No joint deformity upper and lower extremities. Good ROM, no contractures. Normal muscle tone.  Neurologic: CN 2-12 grossly intact. Sensation intact.  Patient moving all 4 extremities spontaneously.  Patient is following all commands.  Patient is responsive to verbal stimuli.   Psychiatric: Patient exhibits normal mood with appropriate affect.  Patient seems to possess insight as to their current situation.     Labs on Admission: I have personally reviewed following labs and imaging studies -   CBC: Recent Labs  Lab 01/22/21 1739 01/23/21 0442 01/24/21 0248 01/29/21 0005 01/29/21 0142  WBC 8.6 8.1 9.2 10.2  --   NEUTROABS  --   --   --  7.3  --   HGB 10.2* 10.3* 10.2* 10.1* 10.9*  HCT 34.7* 34.9* 33.9* 34.3* 32.0*  MCV 92.8 91.4 91.4 92.0  --   PLT 321 294 281 292  --    Basic Metabolic Panel: Recent Labs  Lab 01/22/21 1739 01/23/21 0442 01/24/21 0248 01/29/21 0005  01/29/21 0142  NA 138 137 136 139 140  K 3.1* 3.6 4.2 3.0* 2.8*  CL 95* 91* 93* 88*  --   CO2 34* 37* 36* 42*  --   GLUCOSE 146* 325* 288* 141*  --   BUN 13 13 27* 12  --   CREATININE 1.42* 1.26* 1.25* 1.44*  --   CALCIUM 8.7* 9.3 9.4 9.1  --   MG  --  1.9  --   --   --    GFR: Estimated Creatinine Clearance: 60.6 mL/min (A) (by C-G formula based on SCr of 1.44 mg/dL (H)). Liver Function Tests: Recent Labs  Lab 01/29/21 0005  AST 19  ALT 35  ALKPHOS 76  BILITOT 0.6  PROT 6.2*  ALBUMIN 3.6   No results for input(s): LIPASE, AMYLASE in the last 168 hours. No results for input(s): AMMONIA in the last 168 hours. Coagulation Profile: No results for input(s): INR, PROTIME in the last 168 hours. Cardiac Enzymes: No results for input(s): CKTOTAL, CKMB, CKMBINDEX, TROPONINI in the last 168 hours. BNP (last 3 results) No results for input(s): PROBNP in the last 8760 hours. HbA1C: No results for input(s): HGBA1C in the last 72 hours. CBG: Recent Labs  Lab 01/23/21 1201 01/23/21 1625 01/23/21 2036 01/24/21 0555 01/29/21 0749  GLUCAP 218* 254* 186* 187* 194*   Lipid Profile: No results for input(s): CHOL, HDL, LDLCALC, TRIG, CHOLHDL, LDLDIRECT in the last 72 hours. Thyroid Function Tests: No results for input(s): TSH, T4TOTAL, FREET4, T3FREE, THYROIDAB in the last 72 hours. Anemia Panel: No results for input(s): VITAMINB12, FOLATE, FERRITIN, TIBC, IRON, RETICCTPCT in the last 72 hours. Urine analysis:    Component Value Date/Time   COLORURINE AMBER (A) 10/03/2020 2036   APPEARANCEUR CLOUDY (A) 10/03/2020 2036  LABSPEC 1.016 10/03/2020 2036   PHURINE 6.0 10/03/2020 2036   GLUCOSEU >=500 (A) 10/03/2020 2036   GLUCOSEU NEGATIVE 05/20/2019 0825   HGBUR NEGATIVE 10/03/2020 2036   BILIRUBINUR NEGATIVE 10/03/2020 2036   KETONESUR NEGATIVE 10/03/2020 2036   PROTEINUR >=300 (A) 10/03/2020 2036   UROBILINOGEN 1.0 05/20/2019 0825   NITRITE NEGATIVE 10/03/2020 2036    LEUKOCYTESUR NEGATIVE 10/03/2020 2036    Radiological Exams on Admission - Personally Reviewed: DG Chest Portable 1 View  Result Date: 01/29/2021 CLINICAL DATA:  60 year old male with shortness of breath EXAM: PORTABLE CHEST 1 VIEW COMPARISON:  Chest radiograph dated 01/22/2021. FINDINGS: No focal consolidation, pleural effusion or pneumothorax. Right mid to lower lung field linear atelectasis/scarring. The cardiac silhouette is within limits. Atherosclerotic calcification of the aorta. No acute osseous pathology. IMPRESSION: No active disease. Electronically Signed   By: Anner Crete M.D.   On: 01/29/2021 00:32    EKG: Personally reviewed.  Rhythm is normal sinus rhythm with heart rate of 83 bpm.  No dynamic ST segment changes appreciated.  Assessment/Plan Principal Problem:   COPD and asthma with acute exacerbation [facility])   Patient presenting with rather rapid onset of shortness of breath and respiratory distress  ABG on ED evaluation unfortunately is not useful since it has been obtained after initiation of noninvasive positive pressure ventilation.  Rapid development of symptoms would lead I to believe the patient may have been an flash pulmonary edema, particularly with the markedly elevated blood pressures however chest x-ray here reveals no evidence of pulmonary edema.  Presentation is actually much lower than it was during the last presentation on 5/3.  Finally, exam reveals no elevation of jugular venous pulse, rales or peripheral edema.  Therefore, patient is likely simply suffering from a sudden resurgence of his COPD and asthma, possibly secondary to steroid taper that is too rapid.    Patient and placed him back on intravenous steroids  Resumption of aggressive bronchodilator therapy  BiPAP therapy for now which can hopefully be weaned off by the morning  Active Problems:   Paroxysmal atrial fibrillation (HCC)  Continue anticoagulation with Eliquis  Continue  Coreg  Monitoring patient on telemetry     Chronic combined systolic and diastolic congestive heart failure ([place])   As mentioned above no evidence of cardiogenic volume overload at this time  We will continue home regimen of maintenance diuretics with Lasix 40 mg by mouth twice daily    Type 2 diabetes mellitus with stage 3a chronic kidney disease, with long-term current use of insulin ([place])  . Patient been placed on Accu-Cheks before every meal and nightly with sliding scale insulin . Holding home regimen of hypoglycemics . Hemoglobin A1C ordered . Diabetic Diet    Chronic kidney disease, stage 3a ([place])  . Strict intake and output monitoring . Creatinine near baseline . Minimizing nephrotoxic agents as much as possible . Serial chemistries to monitor renal function and electrolytes    Gastroesophageal reflux disease without esophagitis  . Continuing home regimen of daily PPI therapy.    Mixed diabetic hyperlipidemia associated with type 2 diabetes mellitus ([place])  . Continuing home regimen of lipid lowering therapy.    Code Status:  Full code Family Communication: Spouse is at bedside who has been updated on plan of care.  Status is: Observation  The patient remains OBS appropriate and will d/c before 2 midnights.  Dispo: The patient is from: Home  Anticipated d/c is to: Home              Patient currently is not medically stable to d/c.   Difficult to place patient No        Vernelle Emerald MD [facility] Pager [phone]  If 7PM-7AM, please contact night-coverage [email or web address] Use universal [place] password for that web site. If you do not have the password, please call the hospital operator.  01/29/2021, 8:38 AM

## 2021-01-29 NOTE — ED Provider Notes (Signed)
[facility] DEPARTMENT Provider Note   CSN: [id number] Arrival date & time: 01/28/21  2358     History Chief Complaint  Patient presents with  . Respiratory Distress    Traci Plemons is a 60 y.o. male with a hx of COPD, CHF with last EF 55-60%, DM, hyperlipidemia, hypertension, OSA on CPAP, paroxysmal afib, & chronic resp failure on 3L via [place] who presents to the ED via EMS for dyspnea that began a few hours prior to arrival.  Patient states that he was sleeping propped upright on his baseline oxygen via nasal cannula with acute onset of shortness of breath, constant since onset, progressively worsening with associated chest discomfort prompting call to EMS.  Per EMS patient was extremely diaphoretic, hypertensive, and tachypneic on arrival with poor air movement.  He was giving albuterol neb by the fire department, they placed patient on CPAP and gave 2 nitroglycerin.  Patient states he currently has no chest discomfort, he does feel like his breathing is improved on CPAP.  No other alleviating aggravating factors.  This feels similar to what he has been experiencing with his most recent admission.  He has had a mild cough.  He denies fever, chills, vomiting, diarrhea, abdominal pain, or syncope.  HPI     Past Medical History:  Diagnosis Date  . Anxiety   . Arthritis   . Atrial fibrillation ([place])   . Chest tightness 06/12/2019  . CHF (congestive heart failure) ([place])   . COPD (chronic obstructive pulmonary disease) (H[facility])    emphysema  . Depression   . Diabetes mellitus without complication ([place])   . Heart murmur   . Hyperlipidemia   . Hypertension   . Sleep apnea with use of continuous positive airway pressure (CPAP)     Patient Active Problem List   Diagnosis Date Noted  . Acute on chronic combined systolic and diastolic CHF (congestive heart failure) ([place]) 01/22/2021  . Hyperlipidemia associated with type 2 diabetes mellitus ([place]) 01/22/2021  .  Hypokalemia 01/22/2021  . Tinea pedis 01/15/2021  . Pes planus 01/15/2021  . Chronic respiratory failure with hypoxia ([place]) 12/07/2020  . Oral candidiasis 12/07/2020  . Acute on chronic respiratory failure with hypoxia ([place]) 10/23/2020  . Cardiac arrest ([place]) 10/04/2020  . Acute kidney injury superimposed on CKD ([place]) 09/23/2020  . Thiamine deficiency 07/02/2020  . Acute on chronic respiratory failure ([place]) 06/20/2020  . Obstructive sleep apnea 05/02/2020  . Hyperlipidemia   . Chronic combined systolic and diastolic heart failure ([place]) 04/07/2020  . Chronic constipation 07/19/2019  . Snoring 03/29/2019  . Insomnia due to other mental disorder 11/15/2018  . Anticoagulant long-term use 10/21/2018  . Gastroesophageal reflux disease without esophagitis 08/10/2018  . Tobacco abuse 08/10/2018  . Chest pain 07/27/2018  . Hypertension associated with diabetes ([place]) 07/06/2018  . Chronic obstructive pulmonary disease ([place]) 07/06/2018  . Alcohol abuse 07/06/2018  . Paroxysmal atrial fibrillation ([place]) 11/28/2017  . Slow transit constipation 06/07/2016  . Chronic anemia 06/06/2016  . Hypertensive kidney disease with chronic kidney disease stage III ([place]) 06/06/2016  . Mixed anxiety and depressive disorder 06/05/2016  . Type 2 diabetes mellitus with diabetic neuropathy, with long-term current use of insulin ([place]) 06/05/2016  . Chronic obstructive pulmonary disease with (acute) exacerbation ([place]) 06/05/2016    Past Surgical History:  Procedure Laterality Date  . NO PAST SURGERIES         Family History  Problem Relation Age of Onset  . Diabetes Mother   .  Heart disease Mother   . Diabetes Father   . Heart disease Father   . Diabetes Sister   . Heart disease Sister   . Kidney disease Sister   . Coronary artery disease Brother   . Liver disease Maternal Uncle     Social History   Tobacco Use  . Smoking status: Former Smoker    Packs/day: 2.50    Years: 30.00    Pack years:  75.00    Types: Cigarettes    Quit date: 06/2020    Years since quitting: 0.6  . Smokeless tobacco: Never Used  Vaping Use  . Vaping Use: Never used  Substance Use Topics  . Alcohol use: Not Currently    Comment: pt stated that it was 6 months ago he drunk bourbon  . Drug use: Yes    Frequency: 1.0 times per week    Types: Marijuana    Comment: occ marijuana    Home Medications Prior to Admission medications   Medication Sig Start Date End Date Taking? Authorizing Provider  acetaminophen (TYLENOL) 325 MG tablet Take 650 mg by mouth every 6 (six) hours as needed for mild pain or headache.    [provider]  albuterol (PROVENTIL) (2.5 MG/3ML) 0.083% nebulizer solution Take 3 mLs (2.5 mg total) by nebulization every 6 (six) hours as needed for shortness of breath. 12/25/20   Aline August, MD  amLODipine (NORVASC) 5 MG tablet Take 5 mg by mouth every morning. 10/30/20   [provider]  ARIPiprazole (ABILIFY) 5 MG tablet Take 7 mg by mouth daily.    [provider]  Blood Glucose Monitoring Suppl (ONE TOUCH ULTRA 2) w/Device KIT Use to test blood sugars 1-2 times daily. 04/02/20   [name], MD  busPIRone (BUSPAR) 15 MG tablet Take 1 tablet (15 mg total) by mouth 2 (two) times daily. 09/06/19   [name], MD  carvedilol (COREG) 12.5 MG tablet Take 1 tablet (12.5 mg total) by mouth 2 (two) times daily with a meal. 01/28/21   Buford Dresser, MD  clotrimazole (MYCELEX) 10 MG troche Take 1 tablet (10 mg total) by mouth 5 (five) times daily. 12/07/20   Parrett, Fonnie Mu, NP  DM-GG & DM-APAP-CPM (CORICIDIN HBP DAY/NIGHT COLD) 10-20 &15-200-2 MG MISC Take 1 tablet by mouth every 12 (twelve) hours as needed (cold symptoms).    [provider]  ELIQUIS 5 MG TABS tablet TAKE ONE TABLET BY MOUTH EVERY MORNING and TAKE ONE TABLET BY MOUTH EVERY EVENING Patient taking differently: Take 5 mg by mouth 2 (two) times daily. 10/22/20    Buford Dresser, MD  famotidine (PEPCID) 20 MG tablet Take 20 mg by mouth daily.    [provider]  FLUoxetine (PROZAC) 20 MG capsule Take 1 capsule (20 mg total) by mouth daily. 01/25/21   Dessa Phi, DO  Fluticasone-Umeclidin-Vilant (TRELEGY ELLIPTA) 100-62.5-25 MCG/INH AEPB Inhale 1 puff into the lungs daily. 04/02/20   Collene Gobble, MD  furosemide (LASIX) 40 MG tablet Take 1 tablet (40 mg total) by mouth 2 (two) times daily. 08/02/20   Buford Dresser, MD  gabapentin (NEURONTIN) 100 MG capsule Take 2 capsules (200 mg total) by mouth 3 (three) times daily. Patient taking differently: Take 200 mg by mouth 3 (three) times daily as needed (pain). 11/22/20   [name], MD  glucose blood [facility] ULTRA) test strip USE TO TEST BLOOD SUGAR 1-2 TIMES DAILY 05/22/20   [name], MD  guaiFENesin [facility])  600 MG 12 hr tablet Take 1 tablet (600 mg total) by mouth 2 (two) times daily. 09/24/20   Regalado, Belkys A, MD  hydrALAZINE (APRESOLINE) 50 MG tablet Take 1 tablet (50 mg total) by mouth 3 (three) times daily. 01/24/21 02/23/21  Dessa Phi, DO  insulin glargine (LANTUS SOLOSTAR) 100 UNIT/ML Solostar Pen Inject 55 Units into the skin at bedtime. If fasting blood sugars remain >150 for 1 week, increase insulin dose by 5 units. Maximum of 80 units. 12/19/20   [name], MD  Insulin Pen Needle (PEN NEEDLES) 31G X 6 MM MISC 1 application by Does not apply route at bedtime. 11/03/20   Nche, Charlene Brooke, NP  ipratropium-albuterol (DUONEB) 0.5-2.5 (3) MG/3ML SOLN Take 3 mLs by nebulization every 6 (six) hours as needed. Patient not taking: Reported on 01/22/2021 09/24/20   Regalado, Jerald Kief A, MD  isosorbide mononitrate (IMDUR) 30 MG 24 hr tablet Take 30 mg by mouth daily. 07/04/20   [provider]  ketoconazole (NIZORAL) 2 % cream Apply 1 application topically daily. 01/15/21   Criselda Peaches, DPM  Lancets [facility] ULTRASOFT) lancets USE TO TEST BLOOD  SUGAR 1-2 TIMES DAILY 05/22/20   [name], MD  lubiprostone [facility]) 8 MCG capsule Take 1 capsule (8 mcg total) by mouth 2 (two) times daily with a meal. 01/28/21   [name], MD  Multiple Vitamin (MULTIVITAMIN WITH MINERALS) TABS tablet Take 1 tablet by mouth daily.    [provider]  Multiple Vitamins-Minerals (ONE-A-DAY MENS 50+) TABS Take 1 tablet by mouth daily.    [provider]  nitroGLYCERIN (NITROSTAT) 0.4 MG SL tablet DISSOLVE 1 TABLET UNDER THE TONGUE EVERY 5 MINUTES AS&nbsp;&nbsp;NEEDED FOR CHEST PAIN. MAX&nbsp;&nbsp;OF 3 TABLETS IN 15 MINUTES. CALL 911 IF PAIN PERSISTS. Patient taking differently: Place 0.4 mg under the tongue every 5 (five) minutes as needed for chest pain. 07/25/20   [name], MD  pantoprazole (PROTONIX) 40 MG tablet TAKE ONE TABLET BY MOUTH EVERY MORNING Patient taking differently: Take 40 mg by mouth daily. 10/29/20   Esterwood, Amy S, PA-C  predniSONE (DELTASONE) 10 MG tablet Take 4 tablets (20m total) by mouth once daily for 3 days, then 3 tabs once daily for 3 days, then 2 tabs daily for 3 days, then 1 tab daily. 01/24/21   CDessa Phi DO  pseudoephedrine (SUDAFED) 60 MG tablet Take 60 mg by mouth every 4 (four) hours as needed for congestion.    [provider]  Roflumilast (DALIRESP) 250 MCG TABS Take 250 mcg by mouth daily. 11/06/20   Parrett, TFonnie Mu NP  rosuvastatin (CRESTOR) 20 MG tablet TAKE ONE TABLET BY MOUTH ONCE DAILY Patient taking differently: No sig reported 10/29/20   WDutch QuintB, FNP  spironolactone (ALDACTONE) 25 MG tablet Take 25 mg by mouth daily.    [provider]    Allergies    Lisinopril, Other, and Tomato  Review of Systems   Review of Systems  Constitutional: Negative for chills and fever.  Respiratory: Positive for cough and shortness of breath.   Cardiovascular: Positive for chest pain.  Gastrointestinal: Negative for abdominal pain, diarrhea and vomiting.   Neurological: Negative for syncope.  All other systems reviewed and are negative.   Physical Exam Updated Vital Signs BP (!) 168/87   Pulse 75   Temp 97.8 F (36.6 C) (Temporal)   Resp 19   SpO2 100%   Physical Exam Vitals and nursing note reviewed.  Constitutional:  Appearance: He is well-developed. He is not toxic-appearing.  HENT:     Head: Normocephalic and atraumatic.  Eyes:     General:        Right eye: No discharge.        Left eye: No discharge.     Conjunctiva/sclera: Conjunctivae normal.  Cardiovascular:     Rate and Rhythm: Normal rate and regular rhythm.  Pulmonary:     Comments: Patient currently on CPAP.  Does not appear to be in significant respiratory distress on CPAP.  Poor air movement/decreased breath sounds throughout.  Some coarse breath sounds at the bases. Abdominal:     Palpations: Abdomen is soft.     Tenderness: There is no abdominal tenderness. There is no guarding or rebound.  Musculoskeletal:     Cervical back: Neck supple.     Comments: Trace symmetric lower leg edema noted.  No calf tenderness.  Skin:    General: Skin is warm and dry.     Findings: No rash.  Neurological:     Mental Status: He is alert.     Comments: Clear speech.   Psychiatric:        Behavior: Behavior normal.    ED Results / Procedures / Treatments   Labs (all labs ordered are listed, but only abnormal results are displayed) Labs Reviewed  COMPREHENSIVE METABOLIC PANEL - Abnormal; Notable for the following components:      Result Value   Potassium 3.0 (*)    Chloride 88 (*)    CO2 42 (*)    Glucose, Bld 141 (*)    Creatinine, Ser 1.44 (*)    Total Protein 6.2 (*)    GFR, Estimated 56 (*)    All other components within normal limits  CBC WITH DIFFERENTIAL/PLATELET - Abnormal; Notable for the following components:   RBC 3.73 (*)    Hemoglobin 10.1 (*)    HCT 34.3 (*)    MCHC 29.4 (*)    All other components within normal limits  BRAIN NATRIURETIC  PEPTIDE - Abnormal; Notable for the following components:   B Natriuretic Peptide 432.5 (*)    All other components within normal limits  I-STAT ARTERIAL BLOOD GAS, ED - Abnormal; Notable for the following components:   pCO2 arterial 76.9 (*)    pO2, Arterial 254 (*)    Bicarbonate 50.4 (*)    TCO2 >50 (*)    Acid-Base Excess 22.0 (*)    Potassium 2.8 (*)    HCT 32.0 (*)    Hemoglobin 10.9 (*)    All other components within normal limits  TROPONIN I (HIGH SENSITIVITY) - Abnormal; Notable for the following components:   Troponin I (High Sensitivity) 38 (*)    All other components within normal limits  RESP PANEL BY RT-PCR (FLU A&B, COVID) ARPGX2  BLOOD GAS, ARTERIAL  TROPONIN I (HIGH SENSITIVITY)    EKG EKG Interpretation  Date/Time:  Tuesday Jan 29 2021 00:09:59 EDT Ventricular Rate:  83 PR Interval:  194 QRS Duration: 83 QT Interval:  382 QTC Calculation: 449 R Axis:   62 Text Interpretation: Sinus rhythm LVH with secondary repolarization abnormality No significant change since 01/22/2021 Confirmed by Veryl Speak [phone]) on 01/29/2021 12:15:56 AM   Radiology DG Chest Portable 1 View  Result Date: 01/29/2021 CLINICAL DATA:  60 year old male with shortness of breath EXAM: PORTABLE CHEST 1 VIEW COMPARISON:  Chest radiograph dated 01/22/2021. FINDINGS: No focal consolidation, pleural effusion or pneumothorax. Right mid to lower lung field linear  atelectasis/scarring. The cardiac silhouette is within limits. Atherosclerotic calcification of the aorta. No acute osseous pathology. IMPRESSION: No active disease. Electronically Signed   By: Anner Crete M.D.   On: 01/29/2021 00:32    Procedures .Critical Care Performed by: Amaryllis Dyke, PA-C Authorized by: Amaryllis Dyke, PA-C    CRITICAL CARE Performed by: Kennith Maes   Total critical care time: 35 minutes  Critical care time was exclusive of separately billable procedures and treating other  patients.  Critical care was necessary to treat or prevent imminent or life-threatening deterioration.  Critical care was time spent personally by me on the following activities: development of treatment plan with patient and/or surrogate as well as nursing, discussions with consultants, evaluation of patient's response to treatment, examination of patient, obtaining history from patient or surrogate, ordering and performing treatments and interventions, ordering and review of laboratory studies, ordering and review of radiographic studies, pulse oximetry and re-evaluation of patient's condition.    Medications Ordered in ED Medications - No data to display  ED Course  I have reviewed the triage vital signs and the nursing notes.  Pertinent labs & imaging results that were available during my care of the patient were reviewed by me and considered in my medical decision making (see chart for details).    MDM Rules/Calculators/A&P                          Patient presents to the ED with complaints of dyspnea that acutely began a couple of hours PTA. On CPAP on arrival, poor air movement, BP somewhat elevated.   Ddx: COPD exacerbation, CHF exacerbation, pneumonia, ACS, pulmonary embolism, pneumothorax.  Additional history obtained:  Additional history obtained from chart review & nursing note review.   EKG: No significant change compared to prior.   Lab Tests:  I Ordered, reviewed, and interpreted labs, which included:  CBC: Anemia similar to prior. CMP: Mild increase in creatinine as well as known electrolyte abnormalities.  Bicarb fairly similar to prior, mildly elevated. Running: Elevated, has had similar in the past, suspect demand related. BMP: Elevated but less so than most recent on record. Imaging Studies ordered:  I ordered imaging studies which included CXR, I independently reviewed, formal radiology impression shows:  No active disease  ED Course:  Patient with  hypercapnic respiratory failure requiring BiPAP.  No significant signs of fluid overload on his chest x-ray and there is only trace lower leg edema noted on exam, favor COPD exacerbation.  Steroids and DuoNeb ordered.  Reexamination patient doing well on BiPAP, he has had some improvement in his air movement.  Will discuss with hospitalist service for admission.  Discussed findings and plan of care w/ attending Dr. Stark Jock- in agreement. Patient in agreement as well.   02:08: CONSULT: Discussed with hospitalist Dr. Cyd Silence- accepts admission.    Portions of this note were generated with Lobbyist. Dictation errors may occur despite best attempts at proofreading.  Final Clinical Impression(s) / ED Diagnoses Final diagnoses:  Respiratory failure with hypercapnia, unspecified chronicity [facility])    Rx / DC Orders ED Discharge Orders    None       Amaryllis Dyke, PA-C 01/29/21 0303    Veryl Speak, MD 01/29/21 [phone]

## 2021-01-29 NOTE — ED Notes (Signed)
Breakfast orders placed 

## 2021-01-29 NOTE — Progress Notes (Signed)
Initial Nutrition Assessment  DOCUMENTATION CODES:   Not applicable  INTERVENTION:    Glucerna Shake po BID, each supplement provides 220 kcal and 10 grams of protein  MVI daily   NUTRITION DIAGNOSIS:   Increased nutrient needs related to acute illness as evidenced by estimated needs.  GOAL:   Patient will meet greater than or equal to 90% of their needs  MONITOR:   PO intake,Supplement acceptance,Weight trends,Labs,I & O's  REASON FOR ASSESSMENT:   Consult COPD Protocol  ASSESSMENT:   Patient with PMH significant for COPD, chronic respiratory failure, CHF, CKD III, DM, HTN, HLD, and OSA. Presents this admission with COPD exacerbation.  Patient denies loss in appetite PTA. States he typically consumed three meals daily that consist of B- eggs, grits, toast L- fruits D- meat, vegetable, and grain. Does not use supplementation but is open to trying it this admission. Consumed 100% of his breakfast this am.   Endorses a UBW of 190-200 lb and denies recent weight loss. Records indicate patient has maintained his weight over the last year.   Medications: 40 mg lasix BID, SS novolog, lantus, solumedrol, aldactone Labs: K 3.0 (L) CBG 194-312  NUTRITION - FOCUSED PHYSICAL EXAM:  Flowsheet Row Most Recent Value  Orbital Region No depletion  Upper Arm Region No depletion  Thoracic and Lumbar Region No depletion  Temple Region No depletion  Clavicle Bone Region No depletion  Clavicle and Acromion Bone Region No depletion  Scapular Bone Region No depletion  Dorsal Hand No depletion  Patellar Region No depletion  Anterior Thigh Region No depletion  Posterior Calf Region No depletion  Edema (RD Assessment) None  Hair Reviewed  Eyes Reviewed  Mouth Reviewed  Skin Reviewed  Nails Reviewed     Diet Order:   Diet Order            Diet heart healthy/carb modified Room service appropriate? Yes; Fluid consistency: Thin  Diet effective now                 EDUCATION  NEEDS:   Not appropriate for education at this time  Skin:  Skin Assessment: Reviewed RN Assessment  Last BM:  5/4  Height:   Ht Readings from Last 1 Encounters:  01/29/21 6' (1.829 m)    Weight:   Wt Readings from Last 1 Encounters:  01/29/21 91.8 kg    BMI:  Body mass index is 27.45 kg/m.  Estimated Nutritional Needs:   Kcal:  2100-2300 kcal  Protein:  105-120 grams  Fluid:  >/= 2 L/day  Vanessa Kick RD, LDN Clinical Nutrition Pager listed in AMION

## 2021-01-29 NOTE — Evaluation (Signed)
Physical Therapy Evaluation & Discharge Patient Details Name: Kenneth [name] MRN: [id number] DOB: 12-12-1960 Today's Date: 01/29/2021   History of Present Illness  Pt is a 60 y.o. male admitted 01/28/21 with SOB and chest discomfort; found to be hypertensive, diaphoretic, tachypneic by EMS. Workup for likely COPD exacerbation. PMH includes afib, CHF, COPD, DM, HTN, OSA, depression, anxiety. Of note, recent admission 09/2020 with PEA arrest requiring intubation.    Clinical Impression  Patient evaluated by Physical Therapy with no further acute PT needs identified. PTA, pt independent, lives with significant other, drives occasionally. Today, pt independent with mobility, ambulating well with up to 3/4. Educ re: activity recommendations, energy conservation strategies, importance of mobility. All education has been completed and the patient has no further questions. Acute PT is signing off. Thank you for this referral.  SpO2 94-98% on 3L O2 [place] (pt wears 3L O2 baseline)    Follow Up Recommendations No PT follow up    Equipment Recommendations  None recommended by PT    Recommendations for Other Services       Precautions / Restrictions Precautions Precautions: Other (comment) Precaution Comments: Wears 3L O2 baseline Restrictions Weight Bearing Restrictions: No      Mobility  Bed Mobility Overal bed mobility: Modified Independent             General bed mobility comments: received sitting in recliner    Transfers Overall transfer level: Independent Equipment used: None Transfers: Sit to/from [employer] to Stand: Min guard Stand pivot transfers: Min guard       General transfer comment: Multiple sit<>stands from recliner, indep without use of UEs  Ambulation/Gait Ambulation/Gait assistance: Independent Gait Distance (Feet): 240 Feet Assistive device: None Gait Pattern/deviations: Step-through pattern;Decreased stride length Gait velocity:  Decreased Gait velocity interpretation: 1.31 - 2.62 ft/sec, indicative of limited community ambulator General Gait Details: Slow, steady gait indep without DME, able to manage O2 tank; only requiring assist to manage telemonitor/lines. 1x standing rest break secondary to SOB; cues for activity pacing. Post-ambulation, pt reports 7/10 "burning" in chest (reports, "This is same pain I would use NG for at home" - RN notified) which diminished with seated rest  Stairs            Wheelchair Mobility    Modified Rankin (Stroke Patients Only)       Balance Overall balance assessment: No apparent balance deficits (not formally assessed)                                           Pertinent Vitals/Pain Pain Assessment: Faces Faces Pain Scale: Hurts a little bit Pain Location: Generalized ("from the bed") Pain Descriptors / Indicators: Discomfort;Sore Pain Intervention(s): Monitored during session    Home Living Family/patient expects to be discharged to:: Private residence Living Arrangements: Spouse/significant other Available Help at Discharge: Family;Available 24 hours/day Type of Home: Apartment Home Access: Level entry     Home Layout: One level Home Equipment: Shower seat;Toilet riser Additional Comments: Pt lives with girlfriend who is also disabled. Pt wears 3L O2 [place]    Prior Function Level of Independence: Independent         Comments: Indep ambulating without DME; drives occasionally; gets into bottom of tub to bathe. No recent falls.     Hand Dominance   Dominant Hand: Right    Extremity/Trunk Assessment   Upper Extremity Assessment  Upper Extremity Assessment: Overall WFL for tasks assessed    Lower Extremity Assessment Lower Extremity Assessment: Overall WFL for tasks assessed    Cervical / Trunk Assessment Cervical / Trunk Assessment: Normal  Communication   Communication: No difficulties  Cognition Arousal/Alertness:  Awake/alert Behavior During Therapy: WFL for tasks assessed/performed Overall Cognitive Status: Within Functional Limits for tasks assessed                                        General Comments General comments (skin integrity, edema, etc.): Pt has energy conservation handout provided by OT earlier. Reviewed educ re: activity recommendations, importance of mobility, ambulating with nursing assist for line management    Exercises Other Exercises Other Exercises: 5x repeated sit<>stand without UE support Other Exercises: Educ pt on standing therex within confines of lines/O2 - including sit<>stand, standing marches, steps forwards/backwards   Assessment/Plan    PT Assessment Patent does not need any further PT services  PT Problem List         PT Treatment Interventions      PT Goals (Current goals can be found in the Care Plan section)  Acute Rehab PT Goals Patient Stated Goal: to breathe better PT Goal Formulation: All assessment and education complete, DC therapy    Frequency     Barriers to discharge        Co-evaluation               AM-PAC PT "6 Clicks" Mobility  Outcome Measure Help needed turning from your back to your side while in a flat bed without using bedrails?: None Help needed moving from lying on your back to sitting on the side of a flat bed without using bedrails?: None Help needed moving to and from a bed to a chair (including a wheelchair)?: None Help needed standing up from a chair using your arms (e.g., wheelchair or bedside chair)?: None Help needed to walk in hospital room?: None Help needed climbing 3-5 steps with a railing? : None 6 Click Score: 24    End of Session Equipment Utilized During Treatment: Oxygen Activity Tolerance: Patient tolerated treatment well Patient left: in chair;with call bell/phone within reach Nurse Communication: Mobility status PT Visit Diagnosis: Other abnormalities of gait and mobility  (R26.89)    Time: 8592-9244 PT Time Calculation (min) (ACUTE ONLY): 17 min   Charges:   PT Evaluation $PT Eval Low Complexity: 1 Low     Ina Homes, PT, DPT Acute Rehabilitation Services  Pager [phone] Office [phone]  Malachy Chamber 01/29/2021, 10:24 AM

## 2021-01-29 NOTE — Evaluation (Signed)
Occupational Therapy Evaluation Patient Details Name: Kenneth [name] MRN: [id number] DOB: 07/12/1961 Today's Date: 01/29/2021    History of Present Illness Pt is a 60 y.o. male admitted 01/28/21 with SOB and chest discomfort; found to be hypertensive, diaphoretic, tachypneic by EMS. Workup for likely COPD exacerbation. PMH includes afib, CHF, COPD, DM, HTN, OSA, depression, anxiety. Of note, recent admission 09/2020 with PEA arrest requiring intubation.   Clinical Impression   Pt admitted with the above diagnosis and has the deficits listed below. Pt would benefit from cont OT to address energy conservation techniques as pt gets very SOB. Pt does use 3L home O2 and has girlfriend to help assist as needed.  No post acute OT recommended.    Follow Up Recommendations  Supervision/Assistance - 24 hour;No OT follow up    Equipment Recommendations  None recommended by OT    Recommendations for Other Services       Precautions / Restrictions Precautions Precautions: None Restrictions Weight Bearing Restrictions: No      Mobility Bed Mobility Overal bed mobility: Modified Independent             General bed mobility comments: HOB up to 40 degrees and bedrails utilized.    Transfers Overall transfer level: Needs assistance Equipment used: 1 person hand held assist Transfers: Sit to/from [employer] Sit to Stand: Min guard Stand pivot transfers: Min guard       General transfer comment: Pt mildly unsteady when first standing but longer pt was up, the steadier pt became.    Balance Overall balance assessment: Mild deficits observed, not formally tested                                         ADL either performed or assessed with clinical judgement   ADL Overall ADL's : Needs assistance/impaired Eating/Feeding: Independent;Sitting   Grooming: Wash/dry hands;Wash/dry face;Oral care;Supervision/safety;Standing Grooming Details (indicate cue  type and reason): pt very SOB with all activities on his feet. Upper Body Bathing: Set up;Sitting   Lower Body Bathing: Min guard;Sit to/from stand   Upper Body Dressing : Set up;Sitting   Lower Body Dressing: Min guard;Sit to/from stand   Toilet Transfer: Min guard;Ambulation Toilet Transfer Details (indicate cue type and reason): Pt walked to bathroom with min guard hand held assist. Toileting- Clothing Manipulation and Hygiene: Supervision/safety;Sitting/lateral lean     Tub/Shower Transfer Details (indicate cue type and reason): need to simulate transfer to bottom of tub. Functional mobility during ADLs: Min guard General ADL Comments: Pt close to baseline.  Pt very SOB with all activity.  Respiratory therapy in to give breathing treatment.     Vision Baseline Vision/History: No visual deficits Patient Visual Report: No change from baseline Vision Assessment?: No apparent visual deficits     Perception Perception Perception Tested?: No   Praxis Praxis Praxis tested?: Within functional limits    Pertinent Vitals/Pain Pain Assessment: No/denies pain     Hand Dominance Right   Extremity/Trunk Assessment Upper Extremity Assessment Upper Extremity Assessment: Overall WFL for tasks assessed   Lower Extremity Assessment Lower Extremity Assessment: Defer to PT evaluation   Cervical / Trunk Assessment Cervical / Trunk Assessment: Normal   Communication Communication Communication: No difficulties   Cognition Arousal/Alertness: Awake/alert Behavior During Therapy: WFL for tasks assessed/performed Overall Cognitive Status: Within Functional Limits for tasks assessed  General Comments  Pt limited most by SOB during activities on his feet. Began energy consevation education and handout will be provided.    Exercises     Shoulder Instructions      Home Living Family/patient expects to be discharged to:: Private  residence Living Arrangements: Spouse/significant other Available Help at Discharge: Family;Available 24 hours/day Type of Home: Apartment Home Access: Level entry     Home Layout: One level     Bathroom Shower/Tub: Chief Strategy Officer: Standard     Home Equipment: Shower seat;Toilet riser   Additional Comments: Pt lives with girlfriend who is also disabled.  Pt on home O2 and drives occasionally.      Prior Functioning/Environment Level of Independence: Independent        Comments: drives occasionally, on home O2, gets into bottom of tub to bathe. No recent falls.        OT Problem List: Decreased activity tolerance;Impaired balance (sitting and/or standing);Decreased knowledge of use of DME or AE;Cardiopulmonary status limiting activity      OT Treatment/Interventions: Self-care/ADL training;Energy conservation    OT Goals(Current goals can be found in the care plan section) Acute Rehab OT Goals Patient Stated Goal: to breathe better OT Goal Formulation: With patient Time For Goal Achievement: 02/12/21 Potential to Achieve Goals: Good ADL Goals Additional ADL Goal #1: Pt will gather clothing and dress self without assistive device with one rest break and modified independence. Additional ADL Goal #2: Pt will walk to bathroom and complete all toileting on raised toilet seat with modified independence. Additional ADL Goal #3: Pt will state 3 things that can be done at home to conserve energy during adls and home tasks without cues.  OT Frequency: Min 2X/week   Barriers to D/C:    girlfriend at home to assist at all times.       Co-evaluation              AM-PAC OT "6 Clicks" Daily Activity     Outcome Measure Help from another person eating meals?: None Help from another person taking care of personal grooming?: None Help from another person toileting, which includes using toliet, bedpan, or urinal?: A Little Help from another person  bathing (including washing, rinsing, drying)?: A Little Help from another person to put on and taking off regular upper body clothing?: None Help from another person to put on and taking off regular lower body clothing?: A Little 6 Click Score: 21   End of Session Equipment Utilized During Treatment: Oxygen Nurse Communication: Mobility status  Activity Tolerance: Other (comment) (Pt very SOB) Patient left: in chair;with call bell/phone within reach  OT Visit Diagnosis: Unsteadiness on feet (R26.81)                Time: 1610-9604 OT Time Calculation (min): 17 min Charges:  OT General Charges $OT Visit: 1 Visit OT Evaluation $OT Eval Low Complexity: 1 Low  Hope Budds 01/29/2021, 8:38 AM

## 2021-01-29 NOTE — Plan of Care (Signed)

## 2021-01-29 NOTE — Addendum Note (Signed)
Addended by: Julious Payer A on: 01/29/2021 09:54 AM   Modules accepted: Orders

## 2021-01-29 NOTE — Progress Notes (Signed)
Transported pt from ed to 2C Rm 15. No complications noted

## 2021-01-30 ENCOUNTER — Telehealth: Payer: Medicare Other

## 2021-01-30 ENCOUNTER — Inpatient Hospital Stay: Payer: Medicare Other | Admitting: Family Medicine

## 2021-01-30 ENCOUNTER — Ambulatory Visit (INDEPENDENT_AMBULATORY_CARE_PROVIDER_SITE_OTHER): Payer: Medicare Other | Admitting: *Deleted

## 2021-01-30 DIAGNOSIS — Z87891 Personal history of nicotine dependence: Secondary | ICD-10-CM | POA: Diagnosis not present

## 2021-01-30 DIAGNOSIS — Z79899 Other long term (current) drug therapy: Secondary | ICD-10-CM | POA: Diagnosis not present

## 2021-01-30 DIAGNOSIS — F419 Anxiety disorder, unspecified: Secondary | ICD-10-CM | POA: Diagnosis present

## 2021-01-30 DIAGNOSIS — K219 Gastro-esophageal reflux disease without esophagitis: Secondary | ICD-10-CM | POA: Diagnosis not present

## 2021-01-30 DIAGNOSIS — I480 Paroxysmal atrial fibrillation: Secondary | ICD-10-CM | POA: Diagnosis not present

## 2021-01-30 DIAGNOSIS — Z841 Family history of disorders of kidney and ureter: Secondary | ICD-10-CM | POA: Diagnosis not present

## 2021-01-30 DIAGNOSIS — I130 Hypertensive heart and chronic kidney disease with heart failure and stage 1 through stage 4 chronic kidney disease, or unspecified chronic kidney disease: Secondary | ICD-10-CM | POA: Diagnosis present

## 2021-01-30 DIAGNOSIS — Z794 Long term (current) use of insulin: Secondary | ICD-10-CM | POA: Diagnosis not present

## 2021-01-30 DIAGNOSIS — Z20822 Contact with and (suspected) exposure to covid-19: Secondary | ICD-10-CM | POA: Diagnosis present

## 2021-01-30 DIAGNOSIS — F418 Other specified anxiety disorders: Secondary | ICD-10-CM | POA: Diagnosis not present

## 2021-01-30 DIAGNOSIS — Z8249 Family history of ischemic heart disease and other diseases of the circulatory system: Secondary | ICD-10-CM | POA: Diagnosis not present

## 2021-01-30 DIAGNOSIS — J9621 Acute and chronic respiratory failure with hypoxia: Secondary | ICD-10-CM | POA: Diagnosis present

## 2021-01-30 DIAGNOSIS — E785 Hyperlipidemia, unspecified: Secondary | ICD-10-CM | POA: Diagnosis present

## 2021-01-30 DIAGNOSIS — M7918 Myalgia, other site: Secondary | ICD-10-CM | POA: Diagnosis present

## 2021-01-30 DIAGNOSIS — Z7901 Long term (current) use of anticoagulants: Secondary | ICD-10-CM | POA: Diagnosis not present

## 2021-01-30 DIAGNOSIS — G4733 Obstructive sleep apnea (adult) (pediatric): Secondary | ICD-10-CM | POA: Diagnosis present

## 2021-01-30 DIAGNOSIS — Z7951 Long term (current) use of inhaled steroids: Secondary | ICD-10-CM | POA: Diagnosis not present

## 2021-01-30 DIAGNOSIS — I5042 Chronic combined systolic (congestive) and diastolic (congestive) heart failure: Secondary | ICD-10-CM | POA: Diagnosis not present

## 2021-01-30 DIAGNOSIS — J449 Chronic obstructive pulmonary disease, unspecified: Secondary | ICD-10-CM | POA: Diagnosis not present

## 2021-01-30 DIAGNOSIS — N1831 Chronic kidney disease, stage 3a: Secondary | ICD-10-CM | POA: Diagnosis not present

## 2021-01-30 DIAGNOSIS — F32A Depression, unspecified: Secondary | ICD-10-CM | POA: Diagnosis present

## 2021-01-30 DIAGNOSIS — J441 Chronic obstructive pulmonary disease with (acute) exacerbation: Secondary | ICD-10-CM | POA: Diagnosis not present

## 2021-01-30 DIAGNOSIS — Z833 Family history of diabetes mellitus: Secondary | ICD-10-CM | POA: Diagnosis not present

## 2021-01-30 DIAGNOSIS — J439 Emphysema, unspecified: Secondary | ICD-10-CM | POA: Diagnosis present

## 2021-01-30 DIAGNOSIS — E1169 Type 2 diabetes mellitus with other specified complication: Secondary | ICD-10-CM | POA: Diagnosis present

## 2021-01-30 DIAGNOSIS — E1122 Type 2 diabetes mellitus with diabetic chronic kidney disease: Secondary | ICD-10-CM | POA: Diagnosis present

## 2021-01-30 LAB — GLUCOSE, CAPILLARY
Glucose-Capillary: 103 mg/dL — ABNORMAL HIGH (ref 70–99)
Glucose-Capillary: 108 mg/dL — ABNORMAL HIGH (ref 70–99)
Glucose-Capillary: 158 mg/dL — ABNORMAL HIGH (ref 70–99)
Glucose-Capillary: 182 mg/dL — ABNORMAL HIGH (ref 70–99)
Glucose-Capillary: 241 mg/dL — ABNORMAL HIGH (ref 70–99)
Glucose-Capillary: 291 mg/dL — ABNORMAL HIGH (ref 70–99)

## 2021-01-30 MED ORDER — DOXYCYCLINE HYCLATE 100 MG PO TABS
100.0000 mg | ORAL_TABLET | Freq: Two times a day (BID) | ORAL | Status: DC
  Administered 2021-01-30 – 2021-01-31 (×3): 100 mg via ORAL
  Filled 2021-01-30 (×3): qty 1

## 2021-01-30 MED ORDER — METHYLPREDNISOLONE SODIUM SUCC 40 MG IJ SOLR
40.0000 mg | Freq: Two times a day (BID) | INTRAMUSCULAR | Status: DC
  Administered 2021-01-30 – 2021-01-31 (×2): 40 mg via INTRAVENOUS
  Filled 2021-01-30 (×2): qty 1

## 2021-01-30 MED ORDER — ACETAMINOPHEN 325 MG PO TABS
650.0000 mg | ORAL_TABLET | Freq: Four times a day (QID) | ORAL | Status: DC | PRN
  Administered 2021-01-30 – 2021-01-31 (×4): 650 mg via ORAL
  Filled 2021-01-30 (×4): qty 2

## 2021-01-30 NOTE — Progress Notes (Signed)
Patient refused BIPAP for the night. Told patient to call RN if he changed his mind. Will continue to monitor. Patient states he is noncompliant at home too.

## 2021-01-30 NOTE — Progress Notes (Signed)
Patient not ready for BIPAP yet. Will try again later.

## 2021-01-30 NOTE — Progress Notes (Signed)
PROGRESS NOTE        PATIENT DETAILS Name: Kenneth [name] Age: 60 y.o. Sex: male Date of Birth: 10/17/1960 Admit Date: 01/28/2021 Admitting Physician Marinda Elk, MD TMB:[id number], Bertram Millard, MD  Brief Narrative: Patient is a 60 y.o. male with history of COPD, chronic hypoxic respiratory failure on 3 L of O2, Chronic systolic/diastolic heart failure, PAF on Eliquis, CKD stage IIIa, DM-2, HTN, HLD, depression/anxiety, OSA on CPAP-presented with worsening shortness of breath-he was thought to have acute on chronic hypoxic respiratory failure due to COPD exacerbation.  Significant events: 5/9>> admit for worsening hypoxemia-COPD exacerbation.  Required BiPAP on initial presentation.  Significant studies: 5/10>> chest x-ray: No pneumonia  Antimicrobial therapy: Doxycycline: 5/10>>  Microbiology data: 5/10>> influenza/COVID PCR: Negative  Procedures : None  Consults: None  DVT Prophylaxis : apixaban (ELIQUIS) tablet 5 mg    Subjective: Lying comfortably in bed-feels better-complains of upper abdominal pain.  Cough is better.   Assessment/Plan: Acute on chronic hypoxic respiratory failure due to COPD exacerbation: Hypoxia has improved-did not require BiPAP last night-back on usual 2-3 L of oxygen.  Moving air well-still with some expiratory rhonchi-continue steroids with taper-although improved-not yet at baseline-remains on empiric doxycycline-continue bronchodilators.  Given recent hospitalization-prudent to ensure that stability of symptoms before consideration of discharge.  Reassess on 5/12.  Mild abdominal pain: Mostly in the upper abdomen-abdominal exam is benign-likely due to mild muscular strain from repeated bouts of coughing.  Supportive care for now.  PAF: Continue Coreg-on Eliquis.  Chronic systolic/diastolic heart failure: Volume status stable-continue Lasix, Aldactone  CKD stage IIIa: At baseline  HTN: BP stable-continue Imdur,  amlodipine, hydralazine, Coreg, Lasix, Aldactone  DM-2 (A1c 14.4 on 4/3): CBGs stable-continue Lantus 55 units daily at bedtime-and SSI.  CBG (last 3)  Recent Labs    01/30/21 0542 01/30/21 0848 01/30/21 1152  GLUCAP 182* 291* 158*    OSA: CPAP nightly  Depression/anxiety: Stable-continue fluoxetine, Abilify and BuSpar.  Diet: Diet Order            Diet heart healthy/carb modified Room service appropriate? Yes; Fluid consistency: Thin  Diet effective now                  Code Status: Full code   Family Communication: None at bedside  Disposition Plan: Status is: Observation  The patient will require care spanning > 2 midnights and should be moved to inpatient because: Inpatient level of care appropriate due to severity of illness  Dispo: The patient is from: Home              Anticipated d/c is to: Home              Patient currently is not medically stable to d/c.   Difficult to place patient No   Barriers to Discharge: Resolving COPD exacerbation-not yet stable for discharge-not yet at baseline-on IV steroids  Antimicrobial agents: Anti-infectives (From admission, onward)   Start     Dose/Rate Route Frequency Ordered Stop   01/29/21 1200  doxycycline (VIBRAMYCIN) 100 mg in sodium chloride 0.9 % 250 mL IVPB        100 mg 125 mL/hr over 120 Minutes Intravenous Every 12 hours 01/29/21 1103         Time spent: 35 minutes-Greater than 50% of this time was spent in counseling, explanation of diagnosis, planning of  further management, and coordination of care.  MEDICATIONS: Scheduled Meds: . amLODipine  5 mg Oral q morning  . apixaban  5 mg Oral BID  . ARIPiprazole  7 mg Oral Daily  . aspirin  324 mg Oral Once  . busPIRone  15 mg Oral BID  . carvedilol  12.5 mg Oral BID WC  . famotidine  20 mg Oral Daily  . feeding supplement (GLUCERNA SHAKE)  237 mL Oral BID BM  . FLUoxetine  60 mg Oral Daily  . fluticasone furoate-vilanterol  1 puff Inhalation Daily   . furosemide  40 mg Oral BID  . guaiFENesin  600 mg Oral BID  . hydrALAZINE  25 mg Oral TID  . insulin aspart  0-20 Units Subcutaneous TID AC & HS  . insulin glargine  55 Units Subcutaneous QHS  . ipratropium-albuterol  3 mL Nebulization TID  . isosorbide mononitrate  30 mg Oral Daily  . lubiprostone  8 mcg Oral BID WC  . methylPREDNISolone (SOLU-MEDROL) injection  60 mg Intravenous Q12H  . multivitamin with minerals  1 tablet Oral Daily  . pantoprazole  40 mg Oral Daily  . roflumilast  250 mcg Oral Daily  . rosuvastatin  20 mg Oral Daily  . spironolactone  25 mg Oral Daily  . umeclidinium bromide  1 puff Inhalation Daily   Continuous Infusions: . doxycycline (VIBRAMYCIN) IV 100 mg (01/30/21 1202)   PRN Meds:.acetaminophen, alum & mag hydroxide-simeth, gabapentin, hydrALAZINE, ipratropium-albuterol, nitroGLYCERIN, witch hazel-glycerin   PHYSICAL EXAM: Vital signs: Vitals:   01/29/21 2300 01/30/21 0300 01/30/21 0743 01/30/21 1153  BP: 138/72 (!) 155/74 (!) 166/79 (!) 147/72  Pulse: 73 75 80 68  Resp: 14 19 17 18   Temp: 97.7 F (36.5 C) 97.8 F (36.6 C) 97.6 F (36.4 C) 97.9 F (36.6 C)  TempSrc: Oral Oral Oral Oral  SpO2: 99% 98% 97% 100%  Weight:      Height:       Filed Weights   01/29/21 0649 01/29/21 0657  Weight: 91.8 kg 91.8 kg   Body mass index is 27.45 kg/m.   Gen Exam:Alert awake-not in any distress HEENT:atraumatic, normocephalic Chest: Moving air bilaterally-expiratory rhonchi all over. CVS:S1S2 regular Abdomen:soft non tender, non distended Extremities:no edema Neurology: Non focal Skin: no rash  I have personally reviewed following labs and imaging studies  LABORATORY DATA: CBC: Recent Labs  Lab 01/24/21 0248 01/29/21 0005 01/29/21 0142  WBC 9.2 10.2  --   NEUTROABS  --  7.3  --   HGB 10.2* 10.1* 10.9*  HCT 33.9* 34.3* 32.0*  MCV 91.4 92.0  --   PLT 281 292  --     Basic Metabolic Panel: Recent Labs  Lab 01/24/21 0248  01/29/21 0005 01/29/21 0142  NA 136 139 140  K 4.2 3.0* 2.8*  CL 93* 88*  --   CO2 36* 42*  --   GLUCOSE 288* 141*  --   BUN 27* 12  --   CREATININE 1.25* 1.44*  --   CALCIUM 9.4 9.1  --     GFR: Estimated Creatinine Clearance: 60.6 mL/min (A) (by C-G formula based on SCr of 1.44 mg/dL (H)).  Liver Function Tests: Recent Labs  Lab 01/29/21 0005  AST 19  ALT 35  ALKPHOS 76  BILITOT 0.6  PROT 6.2*  ALBUMIN 3.6   No results for input(s): LIPASE, AMYLASE in the last 168 hours. No results for input(s): AMMONIA in the last 168 hours.  Coagulation Profile: No results  for input(s): INR, PROTIME in the last 168 hours.  Cardiac Enzymes: No results for input(s): CKTOTAL, CKMB, CKMBINDEX, TROPONINI in the last 168 hours.  BNP (last 3 results) No results for input(s): PROBNP in the last 8760 hours.  Lipid Profile: No results for input(s): CHOL, HDL, LDLCALC, TRIG, CHOLHDL, LDLDIRECT in the last 72 hours.  Thyroid Function Tests: No results for input(s): TSH, T4TOTAL, FREET4, T3FREE, THYROIDAB in the last 72 hours.  Anemia Panel: No results for input(s): VITAMINB12, FOLATE, FERRITIN, TIBC, IRON, RETICCTPCT in the last 72 hours.  Urine analysis:    Component Value Date/Time   COLORURINE AMBER (A) 10/03/2020 2036   APPEARANCEUR CLOUDY (A) 10/03/2020 2036   LABSPEC 1.016 10/03/2020 2036   PHURINE 6.0 10/03/2020 2036   GLUCOSEU >=500 (A) 10/03/2020 2036   GLUCOSEU NEGATIVE 05/20/2019 0825   HGBUR NEGATIVE 10/03/2020 2036   BILIRUBINUR NEGATIVE 10/03/2020 2036   KETONESUR NEGATIVE 10/03/2020 2036   PROTEINUR >=300 (A) 10/03/2020 2036   UROBILINOGEN 1.0 05/20/2019 0825   NITRITE NEGATIVE 10/03/2020 2036   LEUKOCYTESUR NEGATIVE 10/03/2020 2036    Sepsis Labs: Lactic Acid, Venous    Component Value Date/Time   LATICACIDVEN 2.2 (HH) 10/22/2020 2326    MICROBIOLOGY: Recent Results (from the past 240 hour(s))  Resp Panel by RT-PCR (Flu A&B, Covid) Nasopharyngeal Swab      Status: None   Collection Time: 01/22/21  5:35 PM   Specimen: Nasopharyngeal Swab; Nasopharyngeal(NP) swabs in vial transport medium  Result Value Ref Range Status   SARS Coronavirus 2 by RT PCR NEGATIVE NEGATIVE Final    Comment: (NOTE) SARS-CoV-2 target nucleic acids are NOT DETECTED.  The SARS-CoV-2 RNA is generally detectable in upper respiratory specimens during the acute phase of infection. The lowest concentration of SARS-CoV-2 viral copies this assay can detect is 138 copies/mL. A negative result does not preclude SARS-Cov-2 infection and should not be used as the sole basis for treatment or other patient management decisions. A negative result may occur with  improper specimen collection/handling, submission of specimen other than nasopharyngeal swab, presence of viral mutation(s) within the areas targeted by this assay, and inadequate number of viral copies(<138 copies/mL). A negative result must be combined with clinical observations, patient history, and epidemiological information. The expected result is Negative.  Fact Sheet for Patients:  BloggerCourse.com  Fact Sheet for Healthcare Providers:  SeriousBroker.it  This test is no t yet approved or cleared by the [place] FDA and  has been authorized for detection and/or diagnosis of SARS-CoV-2 by FDA under an Emergency Use Authorization (EUA). This EUA will remain  in effect (meaning this test can be used) for the duration of the COVID-19 declaration under Section 564(b)(1) of the Act, 21 U.S.C.section 360bbb-3(b)(1), unless the authorization is terminated  or revoked sooner.       Influenza A by PCR NEGATIVE NEGATIVE Final   Influenza B by PCR NEGATIVE NEGATIVE Final    Comment: (NOTE) The Xpert Xpress SARS-CoV-2/FLU/RSV plus assay is intended as an aid in the diagnosis of influenza from Nasopharyngeal swab specimens and should not be used as a sole  basis for treatment. Nasal washings and aspirates are unacceptable for Xpert Xpress SARS-CoV-2/FLU/RSV testing.  Fact Sheet for Patients: BloggerCourse.com  Fact Sheet for Healthcare Providers: SeriousBroker.it  This test is not yet approved or cleared by the [place] FDA and has been authorized for detection and/or diagnosis of SARS-CoV-2 by FDA under an Emergency Use Authorization (EUA). This EUA will remain in effect (meaning  this test can be used) for the duration of the COVID-19 declaration under Section 564(b)(1) of the Act, 21 U.S.C. section 360bbb-3(b)(1), unless the authorization is terminated or revoked.  Performed at [facility], [address], [place], [place] [address]   Resp Panel by RT-PCR (Flu A&B, Covid) Nasopharyngeal Swab     Status: None   Collection Time: 01/29/21  1:20 AM   Specimen: Nasopharyngeal Swab; Nasopharyngeal(NP) swabs in vial transport medium  Result Value Ref Range Status   SARS Coronavirus 2 by RT PCR NEGATIVE NEGATIVE Final    Comment: (NOTE) SARS-CoV-2 target nucleic acids are NOT DETECTED.  The SARS-CoV-2 RNA is generally detectable in upper respiratory specimens during the acute phase of infection. The lowest concentration of SARS-CoV-2 viral copies this assay can detect is 138 copies/mL. A negative result does not preclude SARS-Cov-2 infection and should not be used as the sole basis for treatment or other patient management decisions. A negative result may occur with  improper specimen collection/handling, submission of specimen other than nasopharyngeal swab, presence of viral mutation(s) within the areas targeted by this assay, and inadequate number of viral copies(<138 copies/mL). A negative result must be combined with clinical observations, patient history, and epidemiological information. The expected result is Negative.  Fact Sheet for Patients:   BloggerCourse.com  Fact Sheet for Healthcare Providers:  SeriousBroker.it  This test is no t yet approved or cleared by the [place] FDA and  has been authorized for detection and/or diagnosis of SARS-CoV-2 by FDA under an Emergency Use Authorization (EUA). This EUA will remain  in effect (meaning this test can be used) for the duration of the COVID-19 declaration under Section 564(b)(1) of the Act, 21 U.S.C.section 360bbb-3(b)(1), unless the authorization is terminated  or revoked sooner.       Influenza A by PCR NEGATIVE NEGATIVE Final   Influenza B by PCR NEGATIVE NEGATIVE Final    Comment: (NOTE) The Xpert Xpress SARS-CoV-2/FLU/RSV plus assay is intended as an aid in the diagnosis of influenza from Nasopharyngeal swab specimens and should not be used as a sole basis for treatment. Nasal washings and aspirates are unacceptable for Xpert Xpress SARS-CoV-2/FLU/RSV testing.  Fact Sheet for Patients: BloggerCourse.com  Fact Sheet for Healthcare Providers: SeriousBroker.it  This test is not yet approved or cleared by the [place] FDA and has been authorized for detection and/or diagnosis of SARS-CoV-2 by FDA under an Emergency Use Authorization (EUA). This EUA will remain in effect (meaning this test can be used) for the duration of the COVID-19 declaration under Section 564(b)(1) of the Act, 21 U.S.C. section 360bbb-3(b)(1), unless the authorization is terminated or revoked.  Performed at [facility], [address] [place], [place] [address]   MRSA PCR Screening     Status: None   Collection Time: 01/29/21  8:05 AM   Specimen: Nasal Mucosa; Nasopharyngeal  Result Value Ref Range Status   MRSA by PCR NEGATIVE NEGATIVE Final    Comment:        The GeneXpert MRSA Assay (FDA approved for NASAL specimens only), is one component of a comprehensive MRSA  colonization surveillance program. It is not intended to diagnose MRSA infection nor to guide or monitor treatment for MRSA infections. Performed at [facility], [address] [place], [place] [address]     RADIOLOGY STUDIES/RESULTS: DG Chest Portable 1 View  Result Date: 01/29/2021 CLINICAL DATA:  60 year old male with shortness of breath EXAM: PORTABLE CHEST 1 VIEW COMPARISON:  Chest radiograph dated 01/22/2021. FINDINGS: No focal consolidation, pleural effusion or pneumothorax. Right mid to lower lung field linear atelectasis/scarring. The cardiac silhouette is within limits. Atherosclerotic calcification of the aorta. No acute osseous pathology. IMPRESSION: No active disease. Electronically Signed   By: Elgie CollardArash  Radparvar M.D.   On: 01/29/2021 00:32     LOS: 0 days   Jeoffrey MassedShanker Latravis Grine, MD  [facility]    To contact the attending provider between 7A-7P or the covering provider during after hours 7P-7A, please log into the web site [email or web address] and access using universal [place] password for that web site. If you do not have the password, please call the hospital operator.  01/30/2021, 1:19 PM

## 2021-01-30 NOTE — Progress Notes (Signed)
Mobility Specialist: Progress Note   01/30/21 1408  Mobility  Activity Ambulated in hall  Level of Assistance Independent  Assistive Device None  Distance Ambulated (ft) 380 ft  Mobility Ambulated with assistance in hallway  Mobility Response Tolerated well  Mobility performed by Mobility specialist  Bed Position Chair  $Mobility charge 1 Mobility   Post-Mobility: 88 HR, 153/79 BP, 99% SpO2  Pt ambulated on 3 L/min [place]. Pt c/o feeling a little SOB towards end of ambulation, otherwise asx. Pt to chair after walk with call bell in reach.   [facility] Lazarius Rivkin Mobility Specialist Mobility Specialist Phone: [phone]

## 2021-01-30 NOTE — Consult Note (Signed)
   [facility] CM Inpatient Consult   01/30/2021  Andruw Battie 02-11-1961 [id number] [facility] [[facility]]  Accountable Care Organization [ACO] Patient: [name]  Reviewed for unplanned readmission:  Patient is currently active with the [facility]  for chronic care management services.  Patient has been engaged by an Doctor, general practice..Currently patient is in observation status noted.  Patient is also active with [facility] program noted.    Plan: Will alert the [facility] RNCM of disposition and transition of care. Inpatient Transition Of Care [TOC] team  member to make aware that [facility] Care Management following.   Of note, [facility] Care Management services does not replace or interfere with any services that are needed or arranged by inpatient [facility] care management team.  For additional questions or referrals please contact:  [name], RN BSN CCM [facility]  [phone] business mobile phone Toll free office [phone]  Fax number: [phone] [email or web address]

## 2021-01-30 NOTE — Discharge Instructions (Signed)

## 2021-01-31 ENCOUNTER — Telehealth: Payer: Self-pay | Admitting: *Deleted

## 2021-01-31 LAB — GLUCOSE, CAPILLARY: Glucose-Capillary: 75 mg/dL (ref 70–99)

## 2021-01-31 MED ORDER — PREDNISONE 10 MG PO TABS: ORAL_TABLET | ORAL | 0 refills | Status: DC

## 2021-01-31 MED ORDER — DOXYCYCLINE HYCLATE 100 MG PO TABS: 100.0000 mg | ORAL_TABLET | Freq: Two times a day (BID) | ORAL | 0 refills | Status: DC

## 2021-01-31 NOTE — Progress Notes (Signed)
Mobility Specialist: Progress Note   01/31/21 1110  Mobility  Activity Ambulated in hall  Level of Assistance Independent  Assistive Device None  Distance Ambulated (ft) 380 ft  Mobility Ambulated with assistance in hallway  Mobility Response Tolerated well  Mobility performed by Mobility specialist  Bed Position Chair  $Mobility charge 1 Mobility   Pt ambulated on 3 L/min and was asx during ambulation. After returning to room pt received a phone call saying his ride is here, RN notified.   [facility] Swara Donze Mobility Specialist Mobility Specialist Phone: [phone]

## 2021-01-31 NOTE — Discharge Summary (Addendum)
PATIENT DETAILS Name: Kenneth [name] Age: 60 y.o. Sex: male Date of Birth: 1961/02/19 MRN: [id number]. Admitting Physician: Vernelle Emerald, MD SAY:TKZS, [name], MD  Admit Date: 01/28/2021 Discharge date: 01/31/2021  Recommendations for Outpatient Follow-up:  1. Follow up with PCP in 1-2 weeks 2. Please obtain CMP/CBC in one week   Admitted From:  Home  Disposition: [place]: No  Equipment/Devices: None  Discharge Condition: Stable  CODE STATUS: FULL CODE  Diet recommendation:  Diet Order            Diet - low sodium heart healthy           Diet Carb Modified           Diet heart healthy/carb modified Room service appropriate? Yes; Fluid consistency: Thin  Diet effective now                  Brief Narrative: Patient is a 60 y.o. male with history of COPD, chronic hypoxic respiratory failure on 3 L of O2, Chronic systolic/diastolic heart failure, PAF on Eliquis, CKD stage IIIa, DM-2, HTN, HLD, depression/anxiety, OSA on CPAP-presented with worsening shortness of breath-he was thought to have acute on chronic hypoxic respiratory failure due to COPD exacerbation.  Significant events: 5/9>> admit for worsening hypoxemia-COPD exacerbation.  Required BiPAP on initial presentation.  Significant studies: 5/10>> chest x-ray: No pneumonia  Antimicrobial therapy: Doxycycline: 5/10>>  Microbiology data: 5/10>> influenza/COVID PCR: Negative  Procedures : None  Consults: None  Brief Hospital Course: Acute on chronic hypoxic respiratory failure due to COPD exacerbation:  Required BiPAP on admission-treated with steroids-bronchodilators-is significantly improved.  Has not required BiPAP for 2 days.  This morning-he feels significantly better and thinks he is at baseline.  He has ambulated around the unit with rehab services-with oxygen saturation being in the high 90s.  Since he feels better-suspect he is stable to be discharged on tapering  steroids and his usual bronchodilator regimen.  He is currently back on his usual 2-3 L of oxygen that he is on at home.  Mild abdominal pain: Likely muscular etiology-given bouts of coughing.  Abdomen continues to be benign-pain is very minimal/trivial at this time.   PAF: Continue Coreg-on Eliquis.  Chronic systolic/diastolic heart failure: Volume status stable-continue Lasix, Aldactone  CKD stage IIIa: At baseline  HTN: BP stable-continue Imdur, amlodipine, hydralazine, Coreg, Lasix, Aldactone  DM-2 (A1c 14.4 on 4/3): CBGs stable-continue Lantus 55 units daily at bedtime  OSA: CPAP nightly  Depression/anxiety: Stable-continue fluoxetine, Abilify and BuSpar.   Procedures None  Discharge Diagnoses:  Principal Problem:   COPD with acute exacerbation ([place]) Active Problems:   Paroxysmal atrial fibrillation (HCC)   Gastroesophageal reflux disease without esophagitis   Chronic combined systolic and diastolic congestive heart failure (HCC)   Type 2 diabetes mellitus with stage 3a chronic kidney disease, with long-term current use of insulin (HCC)   Mixed diabetic hyperlipidemia associated with type 2 diabetes mellitus (HCC)   Chronic kidney disease, stage 3a ([place])   Discharge Instructions:  Activity:  As tolerated   Discharge Instructions    Call MD for:  difficulty breathing, headache or visual disturbances   Complete by: As directed    Diet - low sodium heart healthy   Complete by: As directed    Diet Carb Modified   Complete by: As directed    Discharge instructions   Complete by: As directed    Follow with Primary MD  Gena Fray [name], MD in  1-2 weeks  Please get a complete blood count and chemistry panel checked by your Primary MD at your next visit, and again as instructed by your Primary MD.  Get Medicines reviewed and adjusted: Please take all your medications with you for your next visit with your Primary MD  Laboratory/radiological data: Please  request your Primary MD to go over all hospital tests and procedure/radiological results at the follow up, please ask your Primary MD to get all Hospital records sent to his/her office.  In some cases, they will be blood work, cultures and biopsy results pending at the time of your discharge. Please request that your primary care M.D. follows up on these results.  Also Note the following: If you experience worsening of your admission symptoms, develop shortness of breath, life threatening emergency, suicidal or homicidal thoughts you must seek medical attention immediately by calling 911 or calling your MD immediately  if symptoms less severe.  You must read complete instructions/literature along with all the possible adverse reactions/side effects for all the Medicines you take and that have been prescribed to you. Take any new Medicines after you have completely understood and accpet all the possible adverse reactions/side effects.   Do not drive when taking Pain medications or sleeping medications (Benzodaizepines)  Do not take more than prescribed Pain, Sleep and Anxiety Medications. It is not advisable to combine anxiety,sleep and pain medications without talking with your primary care practitioner  Special Instructions: If you have smoked or chewed Tobacco  in the last 2 yrs please stop smoking, stop any regular Alcohol  and or any Recreational drug use.  Wear Seat belts while driving.  Please note: You were cared for by a hospitalist during your hospital stay. Once you are discharged, your primary care physician will handle any further medical issues. Please note that NO REFILLS for any discharge medications will be authorized once you are discharged, as it is imperative that you return to your primary care physician (or establish a relationship with a primary care physician if you do not have one) for your post hospital discharge needs so that they can reassess your need for medications and  monitor your lab values.   Increase activity slowly   Complete by: As directed      Allergies as of 01/31/2021      Reactions   Lisinopril Swelling   Other Other (See Comments)   Lettuce : rash   Tomato Rash      Medication List    TAKE these medications   acetaminophen 325 MG tablet Commonly known as: TYLENOL Take 650 mg by mouth every 6 (six) hours as needed for mild pain or headache.   albuterol (2.5 MG/3ML) 0.083% nebulizer solution Commonly known as: PROVENTIL Take 3 mLs (2.5 mg total) by nebulization every 6 (six) hours as needed for shortness of breath.   amLODipine 5 MG tablet Commonly known as: NORVASC Take 5 mg by mouth every morning.   ARIPiprazole 5 MG tablet Commonly known as: ABILIFY Take 7 mg by mouth daily.   busPIRone 15 MG tablet Commonly known as: BUSPAR Take 1 tablet (15 mg total) by mouth 2 (two) times daily.   carvedilol 12.5 MG tablet Commonly known as: COREG Take 1 tablet (12.5 mg total) by mouth 2 (two) times daily with a meal.   clotrimazole 10 MG troche Commonly known as: MYCELEX Take 1 tablet (10 mg total) by mouth 5 (five) times daily.   Coricidin HBP Day/Night Cold 10-20 &15-200-2 MG  Misc Generic drug: DM-GG & DM-APAP-CPM Take 1 tablet by mouth every 12 (twelve) hours as needed (cold symptoms).   Daliresp 250 MCG Tabs Generic drug: Roflumilast Take 250 mcg by mouth daily.   doxycycline 100 MG tablet Commonly known as: VIBRA-TABS Take 1 tablet (100 mg total) by mouth every 12 (twelve) hours.   Eliquis 5 MG Tabs tablet Generic drug: apixaban TAKE ONE TABLET BY MOUTH EVERY MORNING and TAKE ONE TABLET BY MOUTH EVERY EVENING What changed: See the new instructions.   famotidine 20 MG tablet Commonly known as: PEPCID Take 20 mg by mouth daily.   FLUoxetine 20 MG capsule Commonly known as: PROZAC Take 1 capsule (20 mg total) by mouth daily. What changed: how much to take   furosemide 40 MG tablet Commonly known as:  LASIX Take 1 tablet (40 mg total) by mouth 2 (two) times daily.   gabapentin 100 MG capsule Commonly known as: NEURONTIN Take 2 capsules (200 mg total) by mouth 3 (three) times daily. What changed:   when to take this  reasons to take this   guaiFENesin 600 MG 12 hr tablet Commonly known as: MUCINEX Take 1 tablet (600 mg total) by mouth 2 (two) times daily.   hydrALAZINE 25 MG tablet Commonly known as: APRESOLINE Take 25 mg by mouth 3 (three) times daily.   ipratropium-albuterol 0.5-2.5 (3) MG/3ML Soln Commonly known as: DUONEB Take 3 mLs by nebulization every 6 (six) hours as needed.   isosorbide mononitrate 30 MG 24 hr tablet Commonly known as: IMDUR Take 30 mg by mouth daily.   ketoconazole 2 % cream Commonly known as: NIZORAL Apply 1 application topically daily.   Lantus SoloStar 100 UNIT/ML Solostar Pen Generic drug: insulin glargine Inject 55 Units into the skin at bedtime. If fasting blood sugars remain >150 for 1 week, increase insulin dose by 5 units. Maximum of 80 units.   lubiprostone 8 MCG capsule Commonly known as: [name] Take 1 capsule (8 mcg total) by mouth 2 (two) times daily with a meal.   multivitamin with minerals Tabs tablet Take 1 tablet by mouth daily.   nitroGLYCERIN 0.4 MG SL tablet Commonly known as: NITROSTAT DISSOLVE 1 TABLET UNDER THE TONGUE EVERY 5 MINUTES AS  NEEDED FOR CHEST PAIN. MAX  OF 3 TABLETS IN 15 MINUTES. CALL 911 IF PAIN PERSISTS. What changed:   how much to take  how to take this  when to take this  reasons to take this  additional instructions   ONE TOUCH ULTRA 2 w/Device Kit Use to test blood sugars 1-2 times daily.   One-A-Day Mens 50+ Tabs Take 1 tablet by mouth daily.   OneTouch Ultra test strip Generic drug: glucose blood USE TO TEST BLOOD SUGAR UP TO FOUR TIMES DAILY What changed: additional instructions   onetouch ultrasoft lancets USE TO TEST BLOOD SUGAR 1-2 TIMES DAILY   pantoprazole 40 MG  tablet Commonly known as: PROTONIX TAKE ONE TABLET BY MOUTH EVERY MORNING What changed: when to take this   Pen Needles 31G X 6 MM Misc 1 application by Does not apply route at bedtime.   predniSONE 10 MG tablet Commonly known as: DELTASONE Take 40 mg daily for 2 days, 30 mg daily for 2 days, 20 mg daily for 2 days,10 mg daily for 1 days, then stop What changed: additional instructions   pseudoephedrine 60 MG tablet Commonly known as: SUDAFED Take 60 mg by mouth every 4 (four) hours as needed for congestion.   rosuvastatin  20 MG tablet Commonly known as: CRESTOR TAKE ONE TABLET BY MOUTH ONCE DAILY   spironolactone 25 MG tablet Commonly known as: ALDACTONE Take 25 mg by mouth daily.   Trelegy Ellipta 100-62.5-25 MCG/INH Aepb Generic drug: Fluticasone-Umeclidin-Vilant Inhale 1 puff into the lungs daily.       Follow-up Information    [name], MD. Schedule an appointment as soon as possible for a visit in 1 week(s).   Specialty: Family Medicine Contact information: [address] [place] [address] [phone]              Allergies  Allergen Reactions  . Lisinopril Swelling  . Other Other (See Comments)    Lettuce : rash  . Tomato Rash      Consultations:   None     Other Procedures/Studies: DG Chest Portable 1 View  Result Date: 01/29/2021 CLINICAL DATA:  60 year old male with shortness of breath EXAM: PORTABLE CHEST 1 VIEW COMPARISON:  Chest radiograph dated 01/22/2021. FINDINGS: No focal consolidation, pleural effusion or pneumothorax. Right mid to lower lung field linear atelectasis/scarring. The cardiac silhouette is within limits. Atherosclerotic calcification of the aorta. No acute osseous pathology. IMPRESSION: No active disease. Electronically Signed   By: Anner Crete M.D.   On: 01/29/2021 00:32   DG Chest Port 1 View  Result Date: 01/22/2021 CLINICAL DATA:  Dyspnea EXAM: PORTABLE CHEST 1 VIEW COMPARISON:   12/23/2020, CT 10/03/2020 FINDINGS: Lungs are clear. No pneumothorax or pleural effusion. Multiple nodules noted over the right apex represent callus related to multiple anterior right rib fractures noted on prior CT examination of 10/03/2020. Cardiac size is within normal limits. Pulmonary vascularity is normal. No acute bone abnormality. IMPRESSION: No active disease. Electronically Signed   By: Fidela Salisbury MD   On: 01/22/2021 18:04     TODAY-DAY OF DISCHARGE:  Subjective:   Kenneth [name] today has no headache,no chest abdominal pain,no new weakness tingling or numbness, feels much better wants to go home today.  Objective:   Blood pressure (!) 163/87, pulse 78, temperature 97.9 F (36.6 C), temperature source Oral, resp. rate 16, height 6' (1.829 m), weight 91.8 kg, SpO2 97 %.  Intake/Output Summary (Last 24 hours) at 01/31/2021 0950 Last data filed at 01/31/2021 0000 Gross per 24 hour  Intake 240 ml  Output 200 ml  Net 40 ml   Filed Weights   01/29/21 0649 01/29/21 0657  Weight: 91.8 kg 91.8 kg    Exam: Awake Alert, Oriented *3, No new F.N deficits, Normal affect [place].AT,PERRAL Supple Neck,No JVD, No cervical lymphadenopathy appriciated.  Symmetrical Chest wall movement, Good air movement bilaterally, CTAB RRR,No Gallops,Rubs or new Murmurs, No Parasternal Heave +ve B.Sounds, Abd Soft, Non tender, No organomegaly appriciated, No rebound -guarding or rigidity. No Cyanosis, Clubbing or edema, No new Rash or bruise   PERTINENT RADIOLOGIC STUDIES: No results found.   PERTINENT LAB RESULTS: CBC: Recent Labs    01/29/21 0005 01/29/21 0142  WBC 10.2  --   HGB 10.1* 10.9*  HCT 34.3* 32.0*  PLT 292  --    CMET CMP     Component Value Date/Time   NA 140 01/29/2021 0142   NA 134 07/05/2020 0853   K 2.8 (L) 01/29/2021 0142   CL 88 (L) 01/29/2021 0005   CO2 42 (H) 01/29/2021 0005   GLUCOSE 141 (H) 01/29/2021 0005   BUN 12 01/29/2021 0005   BUN 9 07/05/2020 0853    CREATININE 1.44 (H) 01/29/2021 0005  CALCIUM 9.1 01/29/2021 0005   PROT 6.2 (L) 01/29/2021 0005   ALBUMIN 3.6 01/29/2021 0005   AST 19 01/29/2021 0005   ALT 35 01/29/2021 0005   ALKPHOS 76 01/29/2021 0005   BILITOT 0.6 01/29/2021 0005   GFRNONAA 56 (L) 01/29/2021 0005   GFRAA 85 07/05/2020 0853    GFR Estimated Creatinine Clearance: 60.6 mL/min (A) (by C-G formula based on SCr of 1.44 mg/dL (H)). No results for input(s): LIPASE, AMYLASE in the last 72 hours. No results for input(s): CKTOTAL, CKMB, CKMBINDEX, TROPONINI in the last 72 hours. Invalid input(s): POCBNP No results for input(s): DDIMER in the last 72 hours. No results for input(s): HGBA1C in the last 72 hours. No results for input(s): CHOL, HDL, LDLCALC, TRIG, CHOLHDL, LDLDIRECT in the last 72 hours. No results for input(s): TSH, T4TOTAL, T3FREE, THYROIDAB in the last 72 hours.  Invalid input(s): FREET3 No results for input(s): VITAMINB12, FOLATE, FERRITIN, TIBC, IRON, RETICCTPCT in the last 72 hours. Coags: No results for input(s): INR in the last 72 hours.  Invalid input(s): PT Microbiology: Recent Results (from the past 240 hour(s))  Resp Panel by RT-PCR (Flu A&B, Covid) Nasopharyngeal Swab     Status: None   Collection Time: 01/22/21  5:35 PM   Specimen: Nasopharyngeal Swab; Nasopharyngeal(NP) swabs in vial transport medium  Result Value Ref Range Status   SARS Coronavirus 2 by RT PCR NEGATIVE NEGATIVE Final    Comment: (NOTE) SARS-CoV-2 target nucleic acids are NOT DETECTED.  The SARS-CoV-2 RNA is generally detectable in upper respiratory specimens during the acute phase of infection. The lowest concentration of SARS-CoV-2 viral copies this assay can detect is 138 copies/mL. A negative result does not preclude SARS-Cov-2 infection and should not be used as the sole basis for treatment or other patient management decisions. A negative result may occur with  improper specimen collection/handling, submission  of specimen other than nasopharyngeal swab, presence of viral mutation(s) within the areas targeted by this assay, and inadequate number of viral copies(<138 copies/mL). A negative result must be combined with clinical observations, patient history, and epidemiological information. The expected result is Negative.  Fact Sheet for Patients:  EntrepreneurPulse.com.au  Fact Sheet for Healthcare Providers:  IncredibleEmployment.be  This test is no t yet approved or cleared by the [place] FDA and  has been authorized for detection and/or diagnosis of SARS-CoV-2 by FDA under an Emergency Use Authorization (EUA). This EUA will remain  in effect (meaning this test can be used) for the duration of the COVID-19 declaration under Section 564(b)(1) of the Act, 21 U.S.C.section 360bbb-3(b)(1), unless the authorization is terminated  or revoked sooner.       Influenza A by PCR NEGATIVE NEGATIVE Final   Influenza B by PCR NEGATIVE NEGATIVE Final    Comment: (NOTE) The Xpert Xpress SARS-CoV-2/FLU/RSV plus assay is intended as an aid in the diagnosis of influenza from Nasopharyngeal swab specimens and should not be used as a sole basis for treatment. Nasal washings and aspirates are unacceptable for Xpert Xpress SARS-CoV-2/FLU/RSV testing.  Fact Sheet for Patients: EntrepreneurPulse.com.au  Fact Sheet for Healthcare Providers: IncredibleEmployment.be  This test is not yet approved or cleared by the [place] FDA and has been authorized for detection and/or diagnosis of SARS-CoV-2 by FDA under an Emergency Use Authorization (EUA). This EUA will remain in effect (meaning this test can be used) for the duration of the COVID-19 declaration under Section 564(b)(1) of the Act, 21 U.S.C. section 360bbb-3(b)(1), unless the authorization is terminated  or revoked.  Performed at [facility], [place] [address],  [place], [place] [address]   Resp Panel by RT-PCR (Flu A&B, Covid) Nasopharyngeal Swab     Status: None   Collection Time: 01/29/21  1:20 AM   Specimen: Nasopharyngeal Swab; Nasopharyngeal(NP) swabs in vial transport medium  Result Value Ref Range Status   SARS Coronavirus 2 by RT PCR NEGATIVE NEGATIVE Final    Comment: (NOTE) SARS-CoV-2 target nucleic acids are NOT DETECTED.  The SARS-CoV-2 RNA is generally detectable in upper respiratory specimens during the acute phase of infection. The lowest concentration of SARS-CoV-2 viral copies this assay can detect is 138 copies/mL. A negative result does not preclude SARS-Cov-2 infection and should not be used as the sole basis for treatment or other patient management decisions. A negative result may occur with  improper specimen collection/handling, submission of specimen other than nasopharyngeal swab, presence of viral mutation(s) within the areas targeted by this assay, and inadequate number of viral copies(<138 copies/mL). A negative result must be combined with clinical observations, patient history, and epidemiological information. The expected result is Negative.  Fact Sheet for Patients:  EntrepreneurPulse.com.au  Fact Sheet for Healthcare Providers:  IncredibleEmployment.be  This test is no t yet approved or cleared by the [place] FDA and  has been authorized for detection and/or diagnosis of SARS-CoV-2 by FDA under an Emergency Use Authorization (EUA). This EUA will remain  in effect (meaning this test can be used) for the duration of the COVID-19 declaration under Section 564(b)(1) of the Act, 21 U.S.C.section 360bbb-3(b)(1), unless the authorization is terminated  or revoked sooner.       Influenza A by PCR NEGATIVE NEGATIVE Final   Influenza B by PCR NEGATIVE NEGATIVE Final    Comment: (NOTE) The Xpert Xpress SARS-CoV-2/FLU/RSV plus assay is intended as an aid in the diagnosis of  influenza from Nasopharyngeal swab specimens and should not be used as a sole basis for treatment. Nasal washings and aspirates are unacceptable for Xpert Xpress SARS-CoV-2/FLU/RSV testing.  Fact Sheet for Patients: EntrepreneurPulse.com.au  Fact Sheet for Healthcare Providers: IncredibleEmployment.be  This test is not yet approved or cleared by the [place] FDA and has been authorized for detection and/or diagnosis of SARS-CoV-2 by FDA under an Emergency Use Authorization (EUA). This EUA will remain in effect (meaning this test can be used) for the duration of the COVID-19 declaration under Section 564(b)(1) of the Act, 21 U.S.C. section 360bbb-3(b)(1), unless the authorization is terminated or revoked.  Performed at [facility], [place] [address], [place], [place] [address]   MRSA PCR Screening     Status: None   Collection Time: 01/29/21  8:05 AM   Specimen: Nasal Mucosa; Nasopharyngeal  Result Value Ref Range Status   MRSA by PCR NEGATIVE NEGATIVE Final    Comment:        The GeneXpert MRSA Assay (FDA approved for NASAL specimens only), is one component of a comprehensive MRSA colonization surveillance program. It is not intended to diagnose MRSA infection nor to guide or monitor treatment for MRSA infections. Performed at [facility], [place] [address] [place], [place] [address]     FURTHER DISCHARGE INSTRUCTIONS:  Get Medicines reviewed and adjusted: Please take all your medications with you for your next visit with your Primary MD  Laboratory/radiological data: Please request your Primary MD to go over all hospital tests and procedure/radiological results at the follow up, please ask your Primary MD to get all  Hospital records sent to his/her office.  In some cases, they will be blood work, cultures and biopsy results pending at the time of your discharge. Please request that your primary care M.D. goes through  all the records of your hospital data and follows up on these results.  Also Note the following: If you experience worsening of your admission symptoms, develop shortness of breath, life threatening emergency, suicidal or homicidal thoughts you must seek medical attention immediately by calling 911 or calling your MD immediately  if symptoms less severe.  You must read complete instructions/literature along with all the possible adverse reactions/side effects for all the Medicines you take and that have been prescribed to you. Take any new Medicines after you have completely understood and accpet all the possible adverse reactions/side effects.   Do not drive when taking Pain medications or sleeping medications (Benzodaizepines)  Do not take more than prescribed Pain, Sleep and Anxiety Medications. It is not advisable to combine anxiety,sleep and pain medications without talking with your primary care practitioner  Special Instructions: If you have smoked or chewed Tobacco  in the last 2 yrs please stop smoking, stop any regular Alcohol  and or any Recreational drug use.  Wear Seat belts while driving.  Please note: You were cared for by a hospitalist during your hospital stay. Once you are discharged, your primary care physician will handle any further medical issues. Please note that NO REFILLS for any discharge medications will be authorized once you are discharged, as it is imperative that you return to your primary care physician (or establish a relationship with a primary care physician if you do not have one) for your post hospital discharge needs so that they can reassess your need for medications and monitor your lab values.  Total Time spent coordinating discharge including counseling, education and face to face time equals 35 minutes.  SignedOren Binet 01/31/2021 9:50 AM

## 2021-01-31 NOTE — Chronic Care Management (AMB) (Signed)
  Care Management   Note  01/31/2021 Name: Kenneth [name] MRN: [id number] DOB: 27-Sep-1960  Kenneth [name] is a 60 y.o. year old male who is a primary care patient of [name], MD and is actively engaged with the care management team. I reached out to Gwenith Daily by phone today to assist with re-scheduling a follow up visit with the Pharmacist  Follow up plan: Unsuccessful telephone outreach attempt made. A HIPAA compliant phone message was left for the patient providing contact information and requesting a return call.  The care management team will reach out to the patient again over the next 7 days.  If patient returns call to provider office, please advise to call Embedded Care Management Care Guide Gwenevere Ghazi at [phone].  Gwenevere Ghazi  Care Guide, Embedded Care Coordination [facility] Management

## 2021-01-31 NOTE — Progress Notes (Signed)
Occupational Therapy Treatment Patient Details Name: Kenneth [name] MRN: [id number] DOB: [birth year] Today's Date: 01/31/2021    History of present illness Pt is a 60 y.o. male admitted 01/28/21 with SOB and chest discomfort; found to be hypertensive, diaphoretic, tachypneic by EMS. Workup for likely COPD exacerbation. PMH includes afib, CHF, COPD, DM, HTN, OSA, depression, anxiety. Of note, recent admission 09/2020 with PEA arrest requiring intubation.   OT comments  Pt received in bathroom with pt able to complete toileting hygiene independently from low toilet seat. Pt also able to complete functional mobility from BR>sink to stand at sink for grooming tasks independently. Pt required seated rest break in recliner ~ 5 mins before completing functional mobility greater than a household distance with no AD, supervision only for line mgmt. Pt on 3L [place] during session with SpO2 >96% during ambulation. Pt likely to DC home later today with support from fiance, will follow acutley per POC.   Follow Up Recommendations  Supervision/Assistance - 24 hour;No OT follow up    Equipment Recommendations  None recommended by OT    Recommendations for Other Services      Precautions / Restrictions Precautions Precautions: Other (comment) Precaution Comments: Wears 3L O2 baseline Restrictions Weight Bearing Restrictions: No       Mobility Bed Mobility               General bed mobility comments: pt recieved on toilet and returned to recliner at end of session    Transfers Overall transfer level: Independent Equipment used: None Transfers: Sit to/from Stand Sit to Stand: Independent         General transfer comment: pt sit<>standing from recliner and toilet    Balance Overall balance assessment: No apparent balance deficits (not formally assessed)                                         ADL either performed or assessed with clinical judgement   ADL Overall ADL's :  Needs assistance/impaired     Grooming: Wash/dry hands;Standing;Independent Grooming Details (indicate cue type and reason): pt completing hand washing upon arrival                 Toilet Transfer: Ambulation;Independent;Regular Teacher, adult education Details (indicate cue type and reason): pt recieved on toilet with no AD Toileting- Clothing Manipulation and Hygiene: Independent;Sitting/lateral lean     Tub/Shower Transfer Details (indicate cue type and reason): pt reports bench seat at home for tub; education provided on importance of using TTB as energy conservation strategy Functional mobility during ADLs: Independent General ADL Comments: pt up in room upon arrival on toilet, pt ambulating in room with no AD to complete grooming tasks at sink, pt needed ~ 5 min seated rest break after ADLs to complete ~ 161ft of functional mobility with no AD, with supervision and assist for line mgmt     Vision       Perception     Praxis      Cognition Arousal/Alertness: Awake/alert Behavior During Therapy: WFL for tasks assessed/performed Overall Cognitive Status: Within Functional Limits for tasks assessed                                          Exercises     Shoulder Instructions  General Comments reinforced education from energy conservation handout, pt reports fiance is at home with him during the day and can assist as needed however she is also on disability. Pt on 3L [place] during session with SpO2 >96% during session, pt also able to state appropriate SpO2 reading for home and reports he does have pulse Ox at home. Pt describes fairly active lifestyle where he gets up to take his dog out and walks to his mailbox daily. Pt reports his wife mostly handles meals but he assists as needed.     Pertinent Vitals/ Pain       Pain Assessment: Faces Faces Pain Scale: No hurt  Home Living                                          Prior  Functioning/Environment              Frequency  Min 2X/week        Progress Toward Goals  OT Goals(current goals can now be found in the care plan section)  Progress towards OT goals: Progressing toward goals  Acute Rehab OT Goals Patient Stated Goal: to go home today OT Goal Formulation: With patient Time For Goal Achievement: 02/12/21 Potential to Achieve Goals: Good  Plan Discharge plan remains appropriate;Frequency remains appropriate    Co-evaluation                 AM-PAC OT "6 Clicks" Daily Activity     Outcome Measure   Help from another person eating meals?: None Help from another person taking care of personal grooming?: None Help from another person toileting, which includes using toliet, bedpan, or urinal?: None Help from another person bathing (including washing, rinsing, drying)?: A Little Help from another person to put on and taking off regular upper body clothing?: None Help from another person to put on and taking off regular lower body clothing?: None 6 Click Score: 23    End of Session Equipment Utilized During Treatment: Oxygen;Other (comment) (3L [place])  OT Visit Diagnosis: Unsteadiness on feet (R26.81)   Activity Tolerance Patient tolerated treatment well   Patient Left in chair;with call bell/phone within reach   Nurse Communication Mobility status        Time: 8341-9622 OT Time Calculation (min): 16 min  Charges: OT General Charges $OT Visit: 1 Visit OT Treatments $Self Care/Home Management : 8-22 mins  Lenor Derrick., COTA/L Acute Rehabilitation Services [phone]    Barron Schmid 01/31/2021, 8:31 AM

## 2021-01-31 NOTE — Progress Notes (Signed)
Discharge instructions given to patient, follow up appointments and medications were given pt understands all discharge information.

## 2021-02-01 ENCOUNTER — Telehealth: Payer: Self-pay | Admitting: Adult Health

## 2021-02-01 ENCOUNTER — Telehealth: Payer: Self-pay

## 2021-02-01 ENCOUNTER — Other Ambulatory Visit: Payer: Self-pay

## 2021-02-01 ENCOUNTER — Telehealth: Payer: Self-pay | Admitting: Physician Assistant

## 2021-02-01 ENCOUNTER — Ambulatory Visit: Payer: Medicare Other | Admitting: Family Medicine

## 2021-02-01 NOTE — Chronic Care Management (AMB) (Signed)
CSW spoke with pt on 01/30/2021.  Pt reported he was currently in the ED for the second visit; "for breathing and heart issues".  CSW offered to call pt back next week.   [name] MSW, LCSW Licensed Clinical Social Worker [employer] [phone]

## 2021-02-01 NOTE — Telephone Encounter (Signed)
I called and spoke with Tamela at [facility] about Abilify. I do not see were our office has prescribed Abilify for the patient. Tamela stated she put in the note that she was mistaken and it was prescribed behavioral med and she contacted them. Nothing further needed.

## 2021-02-01 NOTE — Patient Instructions (Incomplete)
Visit Information  PATIENT GOALS: Goals Addressed   None     {CCM PT PRINT INSTRUCTIONS:22237}  {CCM FOLLOW UP PLAN:22241}  Signature***

## 2021-02-01 NOTE — Chronic Care Management (AMB) (Signed)
02/01/2021- Patient called [facility] inquiring about Abilify prior authorization. Called [facility], Kenneth Oaks, Kenneth [name] prescribed Abilify to patient on 01/18/2021. Spoke with front office, they are sending a request to Kenneth Oaks, Kenneth [name] nurse regarding prior authorization. Awaiting response.   Called patient to remind of appointment with Kenneth [name], Kenneth [name] on 02/04/2021 at 9 am. No answer, left message of appointment date and time, and to have all medications, supplements and blood pressure or blood sugar logs near during telephone visit. Patient also notified of call to [facility] for prior authorization on medication.   It was brought to my attention that Kenneth Oaks, Kenneth [name] did not prescribe Abilify, this prescription is prescribed by Kenneth Fries, Kenneth [name]- Psychiatry. Called [facility], office of Kenneth Fries, Kenneth [name] spoke with front staff, they will look into PA for patient and contact [facility] with updates. [facility] and Kenneth [name], Kenneth [name] notfiied.   02/04/2021- Patient had a visit with Kenneth [name], Kenneth [name] today, will be out of Abilify, checking with [facility] to see if medication can be processed again to see if Prior Authorization was completed. Per [facility], medication went through and they will be delivering Kenneth [name] medication today. Kenneth [name], Kenneth [name] notified along with patient.    Kenneth [name], [facility] Concierge [phone]

## 2021-02-01 NOTE — Telephone Encounter (Signed)
The pt has a history of constipation and has not been seen since august of 21.  He would like an appt to discuss. Appt made to see Dr Lavon Paganini in August.  I offered an appt with Hyacinth Meeker on 6/9 and the pt accepted He wanted to leave the appt as planned with Dr Lavon Paganini since he has never seen her in the office.  He has abd pain and constipation.  He is taking amitiza 8 mcg twice daily but it does not seem to be working.  He has been advised to take miralax twice daily until his appt with Victorino Dike and increase liquids.  The pt has been advised of the information and verbalized understanding.

## 2021-02-04 ENCOUNTER — Telehealth: Payer: Self-pay

## 2021-02-04 ENCOUNTER — Ambulatory Visit: Payer: Medicare Other

## 2021-02-04 DIAGNOSIS — E1122 Type 2 diabetes mellitus with diabetic chronic kidney disease: Secondary | ICD-10-CM

## 2021-02-04 DIAGNOSIS — Z794 Long term (current) use of insulin: Secondary | ICD-10-CM

## 2021-02-04 DIAGNOSIS — J441 Chronic obstructive pulmonary disease with (acute) exacerbation: Secondary | ICD-10-CM

## 2021-02-04 DIAGNOSIS — N1831 Chronic kidney disease, stage 3a: Secondary | ICD-10-CM | POA: Diagnosis not present

## 2021-02-04 DIAGNOSIS — J449 Chronic obstructive pulmonary disease, unspecified: Secondary | ICD-10-CM | POA: Diagnosis not present

## 2021-02-04 DIAGNOSIS — F418 Other specified anxiety disorders: Secondary | ICD-10-CM

## 2021-02-04 NOTE — Patient Instructions (Signed)
Visit Information It was great speaking with you today!  Please let me know if you have any questions about our visit.  Goals Addressed            This Visit's Progress   . Monitor and Manage My Blood Sugar-Diabetes Type 2   On track    Timeframe:  Long-Range Goal Priority:  High Start Date:   11/28/2020                        Expected End Date:  09/20/2021                     Follow Up Date  6/21//2022   - Continue to check your blood sugar as advised by your doctor  - Continue to take your diabetic medications as prescribed.  - check your blood sugar if you feel it is too high or too low (Report these ongoing symptoms to your doctor) - Continue to enter your blood sugar readings and medication or insulin into daily log - Always take your blood sugar log or meter to your doctor visits  - Plan to eat low carbohydrate and low salt meals, watch your portion sizes and avoid sugar sweetened drinks.  - Review education article on Hypoglycemia located on your after visit summary.  Why is this important?    Checking your blood sugar at home helps to keep it from getting very high or very low.   Writing the results in a diary or log helps the doctor know how to care for you.   Your blood sugar log should have the time, date and the results.   Also, write down the amount of insulin or other medicine that you take.   Other information, like what you ate, exercise done and how you were feeling, will also be helpful.         . Track and Manage Fluids and Swelling-Heart Failure   On track    Timeframe:  Long-Range Goal Priority:  High Start Date:     11/07/2020                       Expected End Date:    09/20/2021                   Follow Up Date  02/08/2021  - Reinforced with patient to call office if I gain more than 3 pounds in one day or 5 pounds in one week - continue to follow a low salt diet.  - continue to watch for swelling in feet, ankles and legs every day. Report  increase in symptoms to your doctor. Call 911 for severe heart failure symptoms.  - continue to weight yourself daily and record weights. - Continue to take your medications as prescribed and refill them timely.     Why is this important?    It is important to check your weight daily and watch how much salt and liquids you have.   It will help you to manage your heart failure.             Patient Care Plan: General Pharmacy (Adult)    Problem Identified: Hypertension, Hyperlipidemia, Diabetes, Atrial Fibrillation, COPD, Chronic Kidney Disease, Depression and Anxiety   Priority: High    Long-Range Goal: Patient-Specific Goal   Start Date: 12/12/2020  Expected End Date: 06/14/2021  This Visit's Progress: On track  Recent Progress: On track  Priority: High  Note:   Current Barriers:  . Unable to independently afford treatment regimen . Unable to achieve control of diabetes  . Unable to achieve control of heart failure . Unable to achieve control of COPD   Pharmacist Clinical Goal(s):  [name] Patient will verbalize ability to afford treatment regimen . Achieve control of Diabetes as evidenced by A1c less than 8% through collaboration with PharmD and provider. . Achieve control of Heart failure as evidenced by stable weight and absence of hospitalizations  . Achieve control of COPD as evidenced by stable breathing and absence of exacerbations or hospitalizations.   Interventions: . 1:1 collaboration with [name], MD regarding development and update of comprehensive plan of care as evidenced by provider attestation and co-signature . Inter-disciplinary care team collaboration (see longitudinal plan of care) . Comprehensive medication review performed; medication list updated in electronic medical record  COPD (Goal: control symptoms and prevent exacerbations) -Uncontrolled -Current treatment  . Proventil 0.083% nebulizer  . Trelegy 1 puff daily  . DuoNeb 3 mL every 6  hours as needed  . Daliresp 250 mcg daily (started 12/27/20) . Prednisone 10 mg daily  -Medications previously tried: NA  -Gold Grade: Gold 4 (FEV1<40%) -Current COPD Classification:  D (high sx, >/=2 exacerbations/yr) -MMRC/CAT score: 27 (11/05/20) -Pulmonary function testing: FEV1 25%, FVC 48% (Aug 2021) -Exacerbations requiring treatment in last 6 months: Yes -Patient reports consistent use of maintenance inhaler -Frequency of rescue inhaler use: multiple times daily  -Counseled on Proper inhaler technique; -Recommended increasing Daliresp to 500 mg daily  Diabetes (A1c goal <8%) -Uncontrolled -Current medications: . Lantus 55 units nightly  -Medications previously tried: Metformin (AKI, HF)   -Current home glucose readings . fasting glucose: 55, 65, 123, 130  . post prandial glucose: NA -Reports hypoglycemic symptoms: nervous, sweaty, shaky,   -Current meal patterns: Patient reports he has been snacking more since quitting smoking, and is working on trying to manage that better to help with his weight.  -Current exercise: Mostly inactive, limited by his breathing and heart function.  -Counseled on treatment of hypogylcemia -Recommend decreasing Lantus to 50 units nightly   Heart Failure (Goal: manage symptoms and prevent exacerbations) -Uncontrolled -Last ejection fraction: 55-60% (Date: 10/04/20) -HF type: Diastolic -NYHA Class: II (slight limitation of activity) -AHA HF Stage: C (Heart disease and symptoms present) -Current treatment: . Amlodipine 5 mg daily  . Carvedilol 12.5 mg twice daily  . Furosemide 40 mg twice daily  . Hydralazine 25 mg three times daily  . Imdur 30 mg daily -Medications previously tried: Lisinopril (swelling), losartan, spironolactone (hyperkalemia)    -Educated on Importance of weighing daily; if you gain more than 3 pounds in one day or 5 pounds in one week, contact cardiology office -Recommended to continue current medication  Hyperlipidemia:  (LDL goal < 70) -Uncontrolled -Current treatment: . Rosuvastatin 20 mg daily  -Medications previously tried: NA  -Educated on Importance of limiting foods high in cholesterol; -Recommended to continue current medication  -Recommend rechecking fasting lipid panel  Atrial Fibrillation (Goal: prevent stroke and major bleeding) -Controlled -CHADSVASC: 3 -Current treatment: . Rate control: Carvedilol 12.5 mg twice daily  . Anticoagulation: Eliquis 5 mg twice daily  -Medications previously tried: NA -Counseled on avoidance of NSAIDs due to increased bleeding risk with anticoagulants; -Recommended to continue current medication  Depression/Anxiety (Goal: Maintain stable mood) -Managed by Landis Martins, NP  -Controlled -Current treatment: . Arirpiprazole 7 mg daily  . Buspirone 15 mg twice daily  .  Fluoxetine 20 mg daily  -Medications previously tried/failed: NA -PHQ9: 4 -GAD7: 7 -Educated on Benefits of medication for symptom control Benefits of cognitive-behavioral therapy with or without medication -Recommended to continue current medication  Patient Goals/Self-Care Activities . Patient will:  - check glucose daily before breakfast, document, and provide at future appointments check blood pressure 3-4 times weekly, document, and provide at future appointments weigh daily, and contact provider if weight gain of 3 pounds in one day; 5 pounds in one week  Follow Up Plan: Telephone follow up appointment with care management team member scheduled for:  03/12/2021 at 10:30 AM    Patient agreed to services and verbal consent obtained.   The patient verbalized understanding of instructions, educational materials, and care plan provided today and declined offer to receive copy of patient instructions, educational materials, and care plan.   Angelena Sole, PharmD, CPP Clinical Pharmacist [facility] at [facility]  [phone]

## 2021-02-04 NOTE — Progress Notes (Signed)
We will reach out to him to communicate the medication change.    Thanks, Angelena Sole, PharmD, CPP Clinical Pharmacist [facility] at [facility]  [phone]

## 2021-02-04 NOTE — Telephone Encounter (Signed)
-----   Message from Gaspar Cola, [facility] sent at 02/04/2021 12:57 PM EDT -----   ----- Message ----- From: [name], MD Sent: 02/04/2021  11:57 AM EDT To: Gaspar Cola, [facility]  Dear Kenneth [name],  I reviewed your note. The reduction in the Latus dose seems appropriate. Can you communicate this with Mr. Maselli?  Dr. Veto Kemps. ----- Message ----- From: Gaspar Cola, [facility] Sent: 02/04/2021   9:51 AM EDT To: [name], MD  Dr. Veto Kemps, This patient has had two reported instances of hypoglycemia (fasting blood sugar 55 and 65) this month. Otherwise he reports his blood sugars are ranging 110-130s. What would you think about having him decrease his Lantus from 55 units nightly to 50 units?

## 2021-02-04 NOTE — Telephone Encounter (Signed)
Transition Care Management Follow-up Telephone Call  Date of discharge and from where: 01/31/21-[place]  How have you been since you were released from the hospital? Pretty good.   Any questions or concerns? No  Items Reviewed:  Did the pt receive and understand the discharge instructions provided? Yes   Medications obtained and verified? Yes   Other? Yes   Any new allergies since your discharge? No   Dietary orders reviewed? Yes  Do you have support at home? Yes   Home Care and Equipment/Supplies: Were home health services ordered? no If so, what is the name of the agency? n/a  Has the agency set up a time to come to the patient's home? not applicable Were any new equipment or medical supplies ordered?  No What is the name of the medical supply agency? n/a Were you able to get the supplies/equipment? not applicable Do you have any questions related to the use of the equipment or supplies? N/a  Functional Questionnaire: (I = Independent and D = Dependent) ADLs: I  Bathing/Dressing- I  Meal Prep- I with assistance  Eating- I  Maintaining continence- I  Transferring/Ambulation- I  Managing Meds- I  Follow up appointments reviewed:   PCP Hospital f/u appt confirmed? Yes  Scheduled to see Dr. Veto Kemps on 02/08/21 @ 11:30.  Specialist Hospital f/u appt confirmed? Yes  Scheduled to see Jodelle Red on 02/07/2021 @ 9:00.  Are transportation arrangements needed? No   If their condition worsens, is the pt aware to call PCP or go to the Emergency Dept.? Yes  Was the patient provided with contact information for the PCP's office or ED? Yes  Was to pt encouraged to call back with questions or concerns? Yes

## 2021-02-04 NOTE — Progress Notes (Signed)
Inform patient per clinical pharmacist after discussion with patient PCP, they were in agreement with the following plan:   -DECREASE Lantus to 50 units nightly.  -Continue monitoring blood sugars every day before breakfast. He should inform us if he continues to have frequent instances of hypoglycemia or blood sugars less than 80.   Patient verbalized understanding and agrees with plan.  Everlean Cherry Clinical Pharmacist Assistant [phone]

## 2021-02-04 NOTE — Progress Notes (Signed)
Chronic Care Management Pharmacy Note  02/04/2021 Name:  Kenneth [name] MRN:  [id number] DOB:  1960/09/30  Subjective: Kenneth [name] is an 60 y.o. year old male who is a primary patient of [name], [name], MD.  The CCM team was consulted for assistance with disease management and care coordination needs.    Engaged with patient by telephone for follow up visit in response to provider referral for pharmacy case management and/or care coordination services.   Consent to Services:  The patient was given information about Chronic Care Management services, agreed to services, and gave verbal consent prior to initiation of services.  Please see initial visit note for detailed documentation.   Patient Care Team: [name], MD as PCP - General (Family Medicine) Germaine Pomfret, [facility] as Pharmacist (Pharmacist) Madison Hickman, NP as Nurse Practitioner (Psychiatry) Dannielle Karvonen, RN as Case Manager [place] as Registered Nurse [facility] and Palliative Medicine) Deirdre Peer, LCSW as Social Worker (Licensed Clinical Social Worker) Parrett, Fonnie Mu, NP as Nurse Practitioner (Pulmonary Disease) Collene Gobble, MD as Consulting Physician (Pulmonary Disease) Buford Dresser, MD as Consulting Physician (Cardiology)  Recent office visits: 12/31/20: Patient presented to Dr. Gena Fray for follow-up. Continue to hold spironolactone and potassium until cardiology follow-up.  12/19/20: Patient presented to Dr. Gena Fray for follow-up. Lantus increased to 55 units daily.   Recent consult visits: 01/18/21: Patient presented to [facility] (Pulmonology) for follow-up.  12/07/20: Patient presented to Tammy Parrett (Pulmonolgy) for follow-up. Patient started on Mycelex troche and nystatin rinse for candidiasis.   Hospital visits: 5/9-5/12/22: Patient hospitalized for COPD exacerbation. Discharged with doxycyline x1 day. Fluoxetine decreased to 20 mg daily  5/3-01/24/21: Patient  hospitalized for COPD exacerbation. Prednisone taper. Nystatin stopped. Hydralazine increased to 50 mg TID.  4/3-4/5: Patient hospitalized for COPD exacerbation. Potassium, Spironolactone stopped due to hyperkalemia. Patient discharged on Prednisone course for 7 days.   Objective:  Lab Results  Component Value Date   CREATININE 1.44 (H) 01/29/2021   BUN 12 01/29/2021   GFR 56.98 (L) 12/31/2020   GFRNONAA 56 (L) 01/29/2021   GFRAA 85 07/05/2020   NA 140 01/29/2021   K 2.8 (L) 01/29/2021   CALCIUM 9.1 01/29/2021   CO2 42 (H) 01/29/2021   GLUCOSE 141 (H) 01/29/2021    Lab Results  Component Value Date/Time   HGBA1C 14.4 (H) 12/23/2020 11:22 PM   HGBA1C 6.3 (H) 09/13/2020 04:08 AM   GFR 56.98 (L) 12/31/2020 10:06 AM   GFR 50.31 (L) 10/30/2020 08:00 AM   MICROALBUR <0.7 07/06/2018 01:55 PM    Last diabetic Eye exam: No results found for: HMDIABEYEEXA  Last diabetic Foot exam: No results found for: HMDIABFOOTEX   Lab Results  Component Value Date   CHOL 226 (H) 07/06/2018   HDL 68.40 07/06/2018   LDLCALC 119 (H) 07/06/2018   TRIG 275 (H) 10/05/2020   CHOLHDL 3 07/06/2018    Hepatic Function Latest Ref Rng & Units 01/29/2021 12/31/2020 12/23/2020  Total Protein 6.5 - 8.1 g/dL 6.2(L) 6.0 6.7  Albumin 3.5 - 5.0 g/dL 3.6 3.7 3.7  AST 15 - 41 U/L 19 15 26   ALT 0 - 44 U/L 35 33 39  Alk Phosphatase 38 - 126 U/L 76 66 91  Total Bilirubin 0.3 - 1.2 mg/dL 0.6 0.3 0.4  Bilirubin, Direct 0.0 - 0.2 mg/dL - - -    Lab Results  Component Value Date/Time   TSH 4.32 07/06/2018 01:55 PM  CBC Latest Ref Rng & Units 01/29/2021 01/29/2021 01/24/2021  WBC 4.0 - 10.5 K/uL - 10.2 9.2  Hemoglobin 13.0 - 17.0 g/dL 10.9(L) 10.1(L) 10.2(L)  Hematocrit 39.0 - 52.0 % 32.0(L) 34.3(L) 33.9(L)  Platelets 150 - 400 K/uL - 292 281    No results found for: VD25OH  Clinical ASCVD: No  The ASCVD Risk score Mikey Bussing DC Jr., et al., 2013) failed to calculate for the following reasons:   The patient has a  prior MI or stroke diagnosis    Depression screen [facility] 2/9 12/31/2020 11/14/2020 11/07/2020  Decreased Interest 0 1 1  Down, Depressed, Hopeless 0 0 1  PHQ - 2 Score 0 1 2  Altered sleeping 1 - 3  Tired, decreased energy 0 - 2  Change in appetite 1 - 2  Feeling bad or failure about yourself  0 - 1  Trouble concentrating 1 - 1  Moving slowly or fidgety/restless 1 - 1  Suicidal thoughts 0 - 0  PHQ-9 Score 4 - 12  Difficult doing work/chores Not difficult at all - Somewhat difficult  Some recent data might be hidden    Social History   Tobacco Use  Smoking Status Former Smoker  . Packs/day: 2.50  . Years: 30.00  . Pack years: 75.00  . Types: Cigarettes  . Quit date: 06/2020  . Years since quitting: 0.6  Smokeless Tobacco Never Used   BP Readings from Last 3 Encounters:  01/31/21 (!) 163/87  01/24/21 (!) 165/80  01/18/21 (!) 156/82   Pulse Readings from Last 3 Encounters:  01/31/21 78  01/24/21 75  01/18/21 91   Wt Readings from Last 3 Encounters:  01/29/21 202 lb 6.1 oz (91.8 kg)  01/24/21 197 lb 3.2 oz (89.4 kg)  01/18/21 199 lb (90.3 kg)   BMI Readings from Last 3 Encounters:  01/29/21 27.45 kg/m  01/24/21 26.75 kg/m  01/18/21 26.99 kg/m    Assessment/Interventions: Review of patient past medical history, allergies, medications, health status, including review of consultants reports, laboratory and other test data, was performed as part of comprehensive evaluation and provision of chronic care management services.   SDOH:  (Social Determinants of Health) assessments and interventions performed: Yes SDOH Interventions   Flowsheet Row Most Recent Value  SDOH Interventions   Financial Strain Interventions Intervention Not Indicated      CCM Care Plan  Allergies  Allergen Reactions  . Lisinopril Swelling  . Other Other (See Comments)    Lettuce : rash  . Tomato Rash    Medications Reviewed Today    Reviewed by Germaine Pomfret, [facility] (Pharmacist) on  02/04/21 at [phone]  Med List Status: <None>  Medication Order Taking? Sig Documenting Provider Last Dose Status Informant  acetaminophen (TYLENOL) 325 MG tablet [id number]  Take 650 mg by mouth every 6 (six) hours as needed for mild pain or headache. [provider]  Active Self  albuterol (PROVENTIL) (2.5 MG/3ML) 0.083% nebulizer solution [id number]  Take 3 mLs (2.5 mg total) by nebulization every 6 (six) hours as needed for shortness of breath. Aline August, MD  Active Self  amLODipine (NORVASC) 5 MG tablet [id number]  Take 5 mg by mouth every morning. [provider]  Active Self  ARIPiprazole (ABILIFY) 5 MG tablet [id number]  Take 7 mg by mouth daily. [provider]  Active Self           Med Note Raeford Razor Feb 04, 2021  9:24 AM) Prescribed by Trinda Pascal  Blankmann, NP  Blood Glucose Monitoring Suppl (ONE TOUCH ULTRA 2) w/Device KIT [id number]  Use to test blood sugars 1-2 times daily. [name], MD  Active Self  busPIRone (BUSPAR) 15 MG tablet [id number]  Take 1 tablet (15 mg total) by mouth 2 (two) times daily. [name], MD  Active Self           Med Note Michaelle Birks, Cathe Mons A   Mon Feb 04, 2021  9:24 AM) Prescribed by Madison Hickman, NP   carvedilol (COREG) 12.5 MG tablet [id number]  Take 1 tablet (12.5 mg total) by mouth 2 (two) times daily with a meal. Buford Dresser, MD  Active Self  clotrimazole (MYCELEX) 10 MG troche [id number]  Take 1 tablet (10 mg total) by mouth 5 (five) times daily. Parrett, Fonnie Mu, NP  Active Self  DM-GG & DM-APAP-CPM (CORICIDIN HBP DAY/NIGHT COLD) 10-20 &15-200-2 MG MISC [id number]  Take 1 tablet by mouth every 12 (twelve) hours as needed (cold symptoms). [provider]  Active Self  doxycycline (VIBRA-TABS) 100 MG tablet [id number]  Take 1 tablet (100 mg total) by mouth every 12 (twelve) hours. Jonetta Osgood, MD  Active   ELIQUIS 5 MG TABS tablet [id number]  TAKE ONE TABLET BY MOUTH  EVERY MORNING and TAKE ONE TABLET BY MOUTH EVERY Janace Litten, MD  Active   famotidine (PEPCID) 20 MG tablet [id number]  Take 20 mg by mouth daily. [provider]  Active Self  FLUoxetine (PROZAC) 20 MG capsule [id number] Yes Take 60 mg by mouth daily. [provider]  Active            Med Note Raeford Razor Feb 04, 2021  9:24 AM) Prescribed by Madison Hickman, NP   Fluticasone-Umeclidin-Vilant (TRELEGY ELLIPTA) 100-62.5-25 MCG/INH AEPB [id number]  Inhale 1 puff into the lungs daily. Collene Gobble, MD  Active Self           Med Note ([place], [place] Oct 23, 2020  1:23 PM)    furosemide (LASIX) 40 MG tablet [id number]  Take 1 tablet (40 mg total) by mouth 2 (two) times daily. Buford Dresser, MD  Active Self           Med Note ([place], [place] Oct 23, 2020  1:23 PM)    gabapentin (NEURONTIN) 100 MG capsule [id number]  Take 2 capsules (200 mg total) by mouth 3 (three) times daily.  Patient taking differently: Take 200 mg by mouth 3 (three) times daily as needed (pain).   [name], MD  Active Self  glucose blood [facility] ULTRA) test strip [id number]  USE TO TEST BLOOD SUGAR UP TO FOUR TIMES DAILY [name], MD  Active   guaiFENesin (MUCINEX) 600 MG 12 hr tablet [id number]  Take 1 tablet (600 mg total) by mouth 2 (two) times daily. Elmarie Shiley, MD  Active Self           Med Note ([place], DARIUS E   Tue Oct 23, 2020  1:22 PM)    hydrALAZINE (APRESOLINE) 25 MG tablet [id number]  Take 25 mg by mouth 3 (three) times daily. [provider]  Active Self  insulin glargine (LANTUS SOLOSTAR) 100 UNIT/ML Solostar Pen [id number]  Inject 55 Units into the skin at bedtime. If fasting blood sugars remain >150 for 1 week, increase insulin dose by 5 units. Maximum of 80 units. [name], MD  Active Self  Insulin  Pen Needle (PEN NEEDLES) 31G X 6 MM MISC [id number]  1 application by Does not apply route at  bedtime. Nche, Charlene Brooke, NP  Active Self  ipratropium-albuterol (DUONEB) 0.5-2.5 (3) MG/3ML SOLN [id number]  Take 3 mLs by nebulization every 6 (six) hours as needed. Elmarie Shiley, MD  Active Self           Med Note ([place], DARIUS E   Tue Oct 23, 2020  1:22 PM)    isosorbide mononitrate (IMDUR) 30 MG 24 hr tablet [id number]  Take 30 mg by mouth daily. [provider]  Active Self           Med Note ([place], DARIUS E   Tue Oct 23, 2020  1:24 PM)    ketoconazole (NIZORAL) 2 % cream [id number]  Apply 1 application topically daily. Criselda Peaches, DPM  Active Self  Lancets [facility] ULTRASOFT) lancets [id number]  USE TO TEST BLOOD SUGAR 1-2 TIMES DAILY [name], MD  Active Self  lubiprostone ([name]) 8 MCG capsule [id number]  Take 1 capsule (8 mcg total) by mouth 2 (two) times daily with a meal. [name], MD  Active Self  Multiple Vitamin (MULTIVITAMIN WITH MINERALS) TABS tablet [id number]  Take 1 tablet by mouth daily. [provider]  Active Self  Multiple Vitamins-Minerals (ONE-A-DAY MENS 50+) TABS [id number]  Take 1 tablet by mouth daily. [provider]  Active Self  nitroGLYCERIN (NITROSTAT) 0.4 MG SL tablet [id number]  DISSOLVE 1 TABLET UNDER THE TONGUE EVERY 5 MINUTES AS  NEEDED FOR CHEST PAIN. MAX  OF 3 TABLETS IN 15 MINUTES. CALL 911 IF PAIN PERSISTS.  Patient taking differently: Place 0.4 mg under the tongue every 5 (five) minutes as needed for chest pain.   [name], MD  Active Self           Med Note Alvino Chapel, [place] Left Oct 04, 2020  1:52 PM) Last filled 07/25/20  pantoprazole (PROTONIX) 40 MG tablet [id number]  TAKE ONE TABLET BY MOUTH EVERY MORNING  Patient taking differently: Take 40 mg by mouth daily.   Esterwood, Amy S, PA-C  Active Self  predniSONE (DELTASONE) 10 MG tablet [id number]  Take 40 mg daily for 2 days, 30 mg daily for 2 days, 20 mg daily for 2 days,10 mg daily for 1 days, then stop Jonetta Osgood, MD  Active   pseudoephedrine (SUDAFED) 60 MG tablet [id number]  Take 60 mg by mouth every 4 (four) hours as needed for congestion. [provider]  Active Self  Roflumilast (DALIRESP) 250 MCG TABS [id number]  Take 250 mcg by mouth daily. Melvenia Needles, NP  Active Self           Med Note Michaelle Birks, Aras Albarran A   Thu Dec 27, 2020 10:46 AM) Patient receives through [place] Patient Assistance through December 2022.  rosuvastatin (CRESTOR) 20 MG tablet [id number]  TAKE ONE TABLET BY MOUTH ONCE DAILY  Patient taking differently: Take 20 mg by mouth daily.   Dutch Quint B, FNP  Active Self  spironolactone (ALDACTONE) 25 MG tablet [id number]  Take 25 mg by mouth daily. [provider]  Active Self          Patient Active Problem List   Diagnosis Date Noted  . COPD with acute exacerbation ([place]) 01/29/2021  . Chronic kidney disease, stage 3a ([place]) 01/29/2021  . Acute on chronic combined systolic and diastolic CHF (congestive heart failure) ([place]) 01/22/2021  . Mixed  diabetic hyperlipidemia associated with type 2 diabetes mellitus ([place]) 01/22/2021  . Hypokalemia 01/22/2021  . Tinea pedis 01/15/2021  . Pes planus 01/15/2021  . Chronic respiratory failure with hypoxia ([place]) 12/07/2020  . Oral candidiasis 12/07/2020  . Acute on chronic respiratory failure with hypoxia ([place]) 10/23/2020  . Cardiac arrest ([place]) 10/04/2020  . Acute kidney injury superimposed on CKD ([place]) 09/23/2020  . Thiamine deficiency 07/02/2020  . Acute on chronic respiratory failure ([place]) 06/20/2020  . Obstructive sleep apnea 05/02/2020  . Hyperlipidemia   . Chronic combined systolic and diastolic congestive heart failure ([place]) 04/07/2020  . Chronic constipation 07/19/2019  . Snoring 03/29/2019  . Insomnia due to other mental disorder 11/15/2018  . Anticoagulant long-term use 10/21/2018  . Gastroesophageal reflux disease without esophagitis 08/10/2018  . Tobacco abuse 08/10/2018  . Chest pain  07/27/2018  . Hypertension associated with diabetes ([place]) 07/06/2018  . Chronic obstructive pulmonary disease ([place]) 07/06/2018  . Alcohol abuse 07/06/2018  . Paroxysmal atrial fibrillation ([place]) 11/28/2017  . Slow transit constipation 06/07/2016  . Chronic anemia 06/06/2016  . Hypertensive kidney disease with chronic kidney disease stage III ([place]) 06/06/2016  . Mixed anxiety and depressive disorder 06/05/2016  . Type 2 diabetes mellitus with stage 3a chronic kidney disease, with long-term current use of insulin ([place]) 06/05/2016  . Chronic obstructive pulmonary disease with (acute) exacerbation ([place]) 06/05/2016    Immunization History  Administered Date(s) Administered  . Influenza,inj,Quad PF,6+ Mos 06/25/2018, 05/20/2019  . Influenza-Unspecified 06/28/2020  . PFIZER(Purple Top)SARS-COV-2 Vaccination 12/22/2019, 01/16/2020, 10/29/2020  . Pneumococcal Polysaccharide-23 06/08/2016  . Zoster Recombinat (Shingrix) 07/22/2019    Conditions to be addressed/monitored:  Hypertension, Hyperlipidemia, Diabetes, Atrial Fibrillation, COPD, Chronic Kidney Disease, Depression and Anxiety  Care Plan : General Pharmacy (Adult)  Updates made by Germaine Pomfret, RPH since 02/04/2021 12:00 AM    Problem: Hypertension, Hyperlipidemia, Diabetes, Atrial Fibrillation, COPD, Chronic Kidney Disease, Depression and Anxiety   Priority: High    Long-Range Goal: Patient-Specific Goal   Start Date: 12/12/2020  Expected End Date: 06/14/2021  This Visit's Progress: On track  Recent Progress: On track  Priority: High  Note:   Current Barriers:  . Unable to independently afford treatment regimen . Unable to achieve control of diabetes  . Unable to achieve control of heart failure . Unable to achieve control of COPD   Pharmacist Clinical Goal(s):  [name] Patient will verbalize ability to afford treatment regimen . Achieve control of Diabetes as evidenced by A1c less than 8% through collaboration with PharmD  and provider. . Achieve control of Heart failure as evidenced by stable weight and absence of hospitalizations  . Achieve control of COPD as evidenced by stable breathing and absence of exacerbations or hospitalizations.   Interventions: . 1:1 collaboration with [name], MD regarding development and update of comprehensive plan of care as evidenced by provider attestation and co-signature . Inter-disciplinary care team collaboration (see longitudinal plan of care) . Comprehensive medication review performed; medication list updated in electronic medical record  COPD (Goal: control symptoms and prevent exacerbations) -Uncontrolled -Current treatment  . Proventil 0.083% nebulizer  . Trelegy 1 puff daily  . DuoNeb 3 mL every 6 hours as needed  . Daliresp 250 mcg daily (started 12/27/20) . Prednisone 10 mg daily  -Medications previously tried: NA  -Gold Grade: Gold 4 (FEV1<40%) -Current COPD Classification:  D (high sx, >/=2 exacerbations/yr) -MMRC/CAT score: 27 (11/05/20) -Pulmonary function testing: FEV1 25%, FVC 48% (Aug 2021) -Exacerbations requiring treatment in last 6  months: Yes -Patient reports consistent use of maintenance inhaler -Frequency of rescue inhaler use: multiple times daily  -Counseled on Proper inhaler technique; -Recommended increasing Daliresp to 500 mg daily  Diabetes (A1c goal <8%) -Uncontrolled -Current medications: . Lantus 55 units nightly  -Medications previously tried: Metformin (AKI, HF)   -Current home glucose readings . fasting glucose: 55, 65, 123, 130  . post prandial glucose: NA -Reports hypoglycemic symptoms: nervous, sweaty, shaky,   -Current meal patterns: Patient reports he has been snacking more since quitting smoking, and is working on trying to manage that better to help with his weight.  -Current exercise: Mostly inactive, limited by his breathing and heart function.  -Counseled on treatment of hypogylcemia -Recommend decreasing  Lantus to 50 units nightly   Heart Failure (Goal: manage symptoms and prevent exacerbations) -Uncontrolled -Last ejection fraction: 55-60% (Date: 10/04/20) -HF type: Diastolic -NYHA Class: II (slight limitation of activity) -AHA HF Stage: C (Heart disease and symptoms present) -Current treatment: . Amlodipine 5 mg daily  . Carvedilol 12.5 mg twice daily  . Furosemide 40 mg twice daily  . Hydralazine 25 mg three times daily  . Imdur 30 mg daily -Medications previously tried: Lisinopril (swelling), losartan, spironolactone (hyperkalemia)    -Educated on Importance of weighing daily; if you gain more than 3 pounds in one day or 5 pounds in one week, contact cardiology office -Recommended to continue current medication  Hyperlipidemia: (LDL goal < 70) -Uncontrolled -Current treatment: . Rosuvastatin 20 mg daily  -Medications previously tried: NA  -Educated on Importance of limiting foods high in cholesterol; -Recommended to continue current medication  -Recommend rechecking fasting lipid panel  Atrial Fibrillation (Goal: prevent stroke and major bleeding) -Controlled -CHADSVASC: 3 -Current treatment: . Rate control: Carvedilol 12.5 mg twice daily  . Anticoagulation: Eliquis 5 mg twice daily  -Medications previously tried: NA -Counseled on avoidance of NSAIDs due to increased bleeding risk with anticoagulants; -Recommended to continue current medication  Depression/Anxiety (Goal: Maintain stable mood) -Managed by Delrae Alfred, NP  -Controlled -Current treatment: . Arirpiprazole 7 mg daily  . Buspirone 15 mg twice daily  . Fluoxetine 20 mg daily  -Medications previously tried/failed: NA -PHQ9: 4 -GAD7: 7 -Educated on Benefits of medication for symptom control Benefits of cognitive-behavioral therapy with or without medication -Recommended to continue current medication  Patient Goals/Self-Care Activities . Patient will:  - check glucose daily before breakfast,  document, and provide at future appointments check blood pressure 3-4 times weekly, document, and provide at future appointments weigh daily, and contact provider if weight gain of 3 pounds in one day; 5 pounds in one week  Follow Up Plan: Telephone follow up appointment with care management team member scheduled for:  03/12/2021 at 10:30 AM      Medication Assistance: Daliresp obtained through [place] medication assistance program.  Enrollment ends Dec 2022  Patient's preferred pharmacy is:  [facility] - [place], [place] - [address] [place] [address] Phone: [phone] Fax: [phone]  [facility] [address] [place] [address] Phone: [phone] Fax: [phone]  Care Plan and Follow Up Patient Decision:  Patient agrees to Care Plan and Follow-up.  Plan: Telephone follow up appointment with care management team member scheduled for:  03/12/2021 at 10:30 AM  Junius Argyle, PharmD, CPP Clinical Pharmacist [facility]  [phone]

## 2021-02-04 NOTE — Progress Notes (Signed)
Bessie,  Please inform Mr. Sobotka that after discussion with Dr. Veto Kemps, we were in agreement with the following plan:   -DECREASE Lantus to 50 units nightly.  -Continue monitoring blood sugars every day before breakfast. He should inform us if he continues to have frequent instances of hypoglycemia or blood sugars less than 80.    Thanks, Angelena Sole, PharmD, CPP Clinical Pharmacist  Primary Care at [facility]  [phone]

## 2021-02-05 ENCOUNTER — Telehealth: Payer: Self-pay

## 2021-02-05 NOTE — Progress Notes (Signed)
Patient requesting a refill of Nitroglycerin 0.4 mg SL tablet.Patient state he only receive 10 day supply on 01/15/2021 and would like 30 day supply instead.  Completed acute form to be deliver on 02/06/2021 for 30 day supply and sent to clinical pharmacist for review.  Everlean Cherry Clinical Pharmacist Assistant [phone]

## 2021-02-05 NOTE — Progress Notes (Signed)
Cardiology Office Note:    Date:  02/07/2021   ID:  Kenneth [name], DOB [birth year], MRN [id number]  PCP:  [name], MD  Cardiologist:  None  Referring MD: [name], MD   CC: hospital follow-up  History of Present Illness:    Kenneth [name] is a 60 y.o. male with a hx of chronic hypoxic respiratory failure on home O2, hypertension, type II diabetes on long term insulin, chronic kidney disease stage 3a, non-ischemic cardiomyopathy with EF 30-35% prior, recently improved, COPD who is seen for follow up today. I initially saw him 10/21/18 as a new patient to me at the request of [name], for the evaluation and management of paroxysmal atrial fibrillation. He has a history of cardiac arrest 2/2 respiratory failure. He recently presented to the ED with respiratory failure with hypercapnia, unspecified chronicity ([place]). He was discharged on 01/31/2021.  Per Hospitalist's notes: Patient is a59 y.o.malewith history of COPD, chronic hypoxic respiratory failure on 3 L of O2, Chronic systolic/diastolic heart failure, PAF on Eliquis, CKD stage IIIa, DM-2, HTN, HLD, depression/anxiety, OSA on CPAP-presented with worsening shortness of breath-he was thought to have acute on chronic hypoxic respiratory failure due to COPD exacerbation.  Acute on chronic hypoxic respiratory failure due to COPD exacerbation: Required BiPAP on admission-treated with steroids-bronchodilators-is significantly improved.  Has not required BiPAP for 2 days.  This morning-he feels significantly better and thinks he is at baseline.  He has ambulated around the unit with rehab services-with oxygen saturation being in the high 90s.  Since he feels better-suspect he is stable to be discharged on tapering steroids and his usual bronchodilator regimen.  He is currently back on his usual 2-3 L of oxygen that he is on at home.  Today: Reviewed his recent hospitalizations. He is frustrated by his recurrent admissions  for COPD exacerbations/respiratory failure. No signs/symptoms of heart failure, and most recent echo 10/04/20 noted recovered EF of 55-60%.  He is currently on 2-3L of oxygen at baseline. He notes his breathing seems to be improving slighty since his hospital visit. He notes having some LE edema the other day, but has not noticed it often. Occasionally wears compression socks and he believes they do help when he wears them. His weight has remained stable at home.   Typically he uses a CPAP at night, and occasionally during the day as well.  He is followed closely for his his pulmonary issues, will follow up in about a month. He is also seen by palliative care for his symptoms.  He denies any chest pain, palpitations, or exertional symptoms. No headaches, lightheadedness, or syncope to report. Also has no lower extremity edema, orthopnea or PND.  Past Medical History:  Diagnosis Date  . Anxiety   . Arthritis   . Atrial fibrillation ([name])   . Chest tightness 06/12/2019  . CHF (congestive heart failure) ([place])   . COPD (chronic obstructive pulmonary disease) (HCC)    emphysema  . Depression   . Diabetes mellitus without complication ([name])   . Heart murmur   . Hyperlipidemia   . Hypertension   . Sleep apnea with use of continuous positive airway pressure (CPAP)     Past Surgical History:  Procedure Laterality Date  . NO PAST SURGERIES      Current Medications: Current Outpatient Medications on File Prior to Visit  Medication Sig  . acetaminophen (TYLENOL) 325 MG tablet Take 650 mg by mouth every 6 (six) hours as needed for mild  pain or headache.  . albuterol (PROVENTIL) (2.5 MG/3ML) 0.083% nebulizer solution Take 3 mLs (2.5 mg total) by nebulization every 6 (six) hours as needed for shortness of breath.  [name] amLODipine (NORVASC) 5 MG tablet Take 5 mg by mouth every morning.  . ARIPiprazole (ABILIFY) 5 MG tablet Take 7 mg by mouth daily.  . Blood Glucose Monitoring Suppl (ONE TOUCH ULTRA  2) w/Device KIT Use to test blood sugars 1-2 times daily.  . busPIRone (BUSPAR) 15 MG tablet Take 1 tablet (15 mg total) by mouth 2 (two) times daily.  . carvedilol (COREG) 12.5 MG tablet Take 1 tablet (12.5 mg total) by mouth 2 (two) times daily with a meal.  . clotrimazole (MYCELEX) 10 MG troche Take 1 tablet (10 mg total) by mouth 5 (five) times daily.  [name] DM-GG & DM-APAP-CPM (CORICIDIN HBP DAY/NIGHT COLD) 10-20 &15-200-2 MG MISC Take 1 tablet by mouth every 12 (twelve) hours as needed (cold symptoms).  [name] doxycycline (VIBRA-TABS) 100 MG tablet Take 1 tablet (100 mg total) by mouth every 12 (twelve) hours.  [name] ELIQUIS 5 MG TABS tablet TAKE ONE TABLET BY MOUTH EVERY MORNING and TAKE ONE TABLET BY MOUTH EVERY EVENING  . famotidine (PEPCID) 20 MG tablet Take 20 mg by mouth daily.  [name] FLUoxetine (PROZAC) 20 MG capsule Take 60 mg by mouth daily.  . Fluticasone-Umeclidin-Vilant (TRELEGY ELLIPTA) 100-62.5-25 MCG/INH AEPB Inhale 1 puff into the lungs daily.  . furosemide (LASIX) 40 MG tablet Take 1 tablet (40 mg total) by mouth 2 (two) times daily.  [name] gabapentin (NEURONTIN) 100 MG capsule Take 2 capsules (200 mg total) by mouth 3 (three) times daily. (Patient taking differently: Take 200 mg by mouth 3 (three) times daily as needed (pain).)  . glucose blood (ONETOUCH ULTRA) test strip USE TO TEST BLOOD SUGAR UP TO FOUR TIMES DAILY  . guaiFENesin (MUCINEX) 600 MG 12 hr tablet Take 1 tablet (600 mg total) by mouth 2 (two) times daily.  . hydrALAZINE (APRESOLINE) 25 MG tablet Take 25 mg by mouth 3 (three) times daily.  . insulin glargine (LANTUS SOLOSTAR) 100 UNIT/ML Solostar Pen Inject 55 Units into the skin at bedtime. If fasting blood sugars remain >150 for 1 week, increase insulin dose by 5 units. Maximum of 80 units. (Patient taking differently: Inject 50 Units into the skin at bedtime. If fasting blood sugars remain >150 for 1 week, increase insulin dose by 5 units. Maximum of 80 units.)  . Insulin Pen  Needle (PEN NEEDLES) 31G X 6 MM MISC 1 application by Does not apply route at bedtime.  [name] ipratropium-albuterol (DUONEB) 0.5-2.5 (3) MG/3ML SOLN Take 3 mLs by nebulization every 6 (six) hours as needed.  . isosorbide mononitrate (IMDUR) 30 MG 24 hr tablet Take 30 mg by mouth daily.  [name] ketoconazole (NIZORAL) 2 % cream Apply 1 application topically daily.  . Lancets (ONETOUCH ULTRASOFT) lancets USE TO TEST BLOOD SUGAR 1-2 TIMES DAILY  . lubiprostone ([name]) 8 MCG capsule Take 1 capsule (8 mcg total) by mouth 2 (two) times daily with a meal.  . Multiple Vitamins-Minerals (ONE-A-DAY MENS 50+) TABS Take 1 tablet by mouth daily.  . nitroGLYCERIN (NITROSTAT) 0.4 MG SL tablet DISSOLVE 1 TABLET UNDER THE TONGUE EVERY 5 MINUTES AS  NEEDED FOR CHEST PAIN. MAX  OF 3 TABLETS IN 15 MINUTES. CALL 911 IF PAIN PERSISTS. (Patient taking differently: Place 0.4 mg under the tongue every 5 (five) minutes as needed for chest pain.)  . pantoprazole (PROTONIX) 40 MG  tablet TAKE ONE TABLET BY MOUTH EVERY MORNING (Patient taking differently: Take 40 mg by mouth daily.)  . predniSONE (DELTASONE) 10 MG tablet Take 40 mg daily for 2 days, 30 mg daily for 2 days, 20 mg daily for 2 days,10 mg daily for 1 days, then stop (Patient taking differently: Take 10 mg by mouth 2 (two) times daily with a meal. Take 40 mg daily for 2 days, 30 mg daily for 2 days, 20 mg daily for 2 days,10 mg daily for 1 days, then stop)  . pseudoephedrine (SUDAFED) 60 MG tablet Take 60 mg by mouth every 4 (four) hours as needed for congestion.  . Roflumilast (DALIRESP) 250 MCG TABS Take 250 mcg by mouth daily.  . rosuvastatin (CRESTOR) 20 MG tablet TAKE ONE TABLET BY MOUTH ONCE DAILY  . spironolactone (ALDACTONE) 25 MG tablet Take 25 mg by mouth daily.   No current facility-administered medications on file prior to visit.     Allergies:   Lisinopril, Other, and Tomato   Social History   Tobacco Use  . Smoking status: Former Smoker    Packs/day:  2.50    Years: 30.00    Pack years: 75.00    Types: Cigarettes    Quit date: 06/2020    Years since quitting: 0.6  . Smokeless tobacco: Never Used  Vaping Use  . Vaping Use: Never used  Substance Use Topics  . Alcohol use: Not Currently    Comment: pt stated that it was 6 months ago he drunk bourbon  . Drug use: Yes    Frequency: 1.0 times per week    Types: Marijuana    Comment: occ marijuana    Family History: family history includes Coronary artery disease in his brother; Diabetes in his father, mother, and sister; Heart disease in his father, mother, and sister; Kidney disease in his sister; Liver disease in his maternal uncle.  ROS:   Please see the history of present illness.  Additional pertinent ROS: Constitutional: Negative for chills, fever, night sweats, unintentional weight loss  HENT: Negative for ear pain and hearing loss.   Eyes: Negative for loss of vision and eye pain.  Respiratory: On oxygen 2-3L. Positive for shortness of breath. Negative for cough, sputum, wheezing.   Cardiovascular: See HPI. Gastrointestinal: Negative for abdominal pain, melena, and hematochezia.  Genitourinary: Negative for dysuria and hematuria.  Musculoskeletal: Positive for LE edema. Negative for falls and myalgias.  Skin: Negative for itching and rash.  Neurological: Negative for focal weakness, focal sensory changes and loss of consciousness.  Endo/Heme/Allergies: Does not bruise/bleed easily.     EKGs/Labs/Other Studies Reviewed:    The following studies were reviewed today:  Echo 10/04/2020: 1. Left ventricular ejection fraction, by estimation, is 55 to 60%. The  left ventricle has normal function. The left ventricle has no regional  wall motion abnormalities. There is mild left ventricular hypertrophy.  Left ventricular diastolic parameters  are indeterminate.  2. Right ventricular systolic function is normal. The right ventricular  size is normal. Tricuspid regurgitation  signal is inadequate for assessing  PA pressure.  3. A small pericardial effusion is present.  4. The mitral valve is normal in structure. No evidence of mitral valve  regurgitation.  5. The aortic valve was not well visualized. Aortic valve regurgitation  is mild. Mild to moderate aortic valve stenosis. Vmax 2.8 m/s, MG 74mHg,  AVA 1.6 cm^2, DI 0.4   Echo 09/13/2020: 1. Left ventricular ejection fraction, by estimation, is 60 to  65%. The  left ventricle has normal function. The left ventricle has no regional  wall motion abnormalities. There is moderate concentric left ventricular  hypertrophy. Left ventricular  diastolic parameters are consistent with Grade I diastolic dysfunction  (impaired relaxation).  2. Right ventricular systolic function is normal. The right ventricular  size is normal. There is mildly elevated pulmonary artery systolic  pressure.  3. The mitral valve is normal in structure. No evidence of mitral valve  regurgitation. No evidence of mitral stenosis.  4. The aortic valve is tricuspid. Aortic valve regurgitation is mild to  moderate. Mild aortic valve stenosis. Aortic regurgitation PHT measures  434 msec. Aortic valve area, by VTI measures 1.38 cm. Aortic valve mean  gradient measures 12.0 mmHg. Aortic  valve Vmax measures 2.50 m/s.  5. The inferior vena cava is dilated in size with <50% respiratory  variability, suggesting right atrial pressure of 15 mmHg.  EKG:   02/07/2021: NSR at 79 bpm, LVH with repol.  Recent Labs: 01/23/2021: Magnesium 1.9 01/29/2021: ALT 35; B Natriuretic Peptide 432.5; BUN 12; Creatinine, Ser 1.44; Hemoglobin 10.9; Platelets 292; Potassium 2.8; Sodium 140  Recent Lipid Panel    Component Value Date/Time   CHOL 226 (H) 07/06/2018 1355   TRIG 275 (H) 10/05/2020 0126   HDL 68.40 07/06/2018 1355   CHOLHDL 3 07/06/2018 1355   VLDL 38.0 07/06/2018 1355   LDLCALC 119 (H) 07/06/2018 1355    Physical Exam:    VS:  BP 122/68  (BP Location: Left Arm, Patient Position: Sitting, Cuff Size: Normal)   Pulse 79   Ht 6' (1.829 m)   Wt 207 lb (93.9 kg)   BMI 28.07 kg/m     Wt Readings from Last 3 Encounters:  02/07/21 207 lb (93.9 kg)  01/29/21 202 lb 6.1 oz (91.8 kg)  01/24/21 197 lb 3.2 oz (89.4 kg)    GEN: Well nourished, well developed in no acute distress HEENT: Normal, moist mucous membranes NECK: No JVD appreciated CARDIAC: distant heart sounds, regular rhythm, normal S1 and S2, no rubs or gallops. No murmur. VASCULAR: Radial and DP pulses 2+ bilaterally. No carotid bruits RESPIRATORY:  On 2-3 L oxygen by [place]. Distant breath sounds, prolonged expiratory phase but clear to auscultation without rales, wheezing or rhonchi  ABDOMEN: Soft, non-tender, non-distended MUSCULOSKELETAL:  Ambulates independently SKIN: Warm and dry, no edema NEUROLOGIC:  Alert and oriented x 3. No focal neuro deficits noted. PSYCHIATRIC:  Normal affect    ASSESSMENT:    1. Chronic respiratory failure with hypoxia (HCC)   2. Hospital discharge follow-up   3. Chronic combined systolic and diastolic congestive heart failure ([place])   4. PAF (paroxysmal atrial fibrillation) ([place])   5. Obstructive sleep apnea   6. Type 2 diabetes mellitus with stage 3a chronic kidney disease, with long-term current use of insulin ([place])   7. Essential hypertension    PLAN:    Chronic systolic and diastolic heart failure, with recovered EF: -appears euvolemic, weights stable -continue carvedilol, hydralazine/isosorbide, spironolactone -continue current furosemide dose.  -not trialed on entresto due to renal function, risk for angioedema  Paroxysmal atrial fibrillation -CHA2DS2/VAS Stroke Risk Points=3 -continue apixaban, denies bleeding issues  Hypertension: -at goal today -continue furosemide, spironolactone -continue carvedilol, hydralazine/isosorbide, amlodipine  Sleep apnea: -has CPAP, using.  Severe COPD: with chronic hypoxic  respiratory failure. -followed by Dr. Lamonte Sakai, multiple admissions for COPD exacerbation. Reviewed recent admission today -encouraged tobacco abstinence  Type II diabetes, with chronic kidney disease stage 3a: -on  long term insulin -have discussed SGLT2i and GLP1RA, but cost is a concern for him. -continue rosuvastatin -no longer on aspirin as he is on apixaban  Cardiac risk counseling and prevention recommendations: -recommend heart healthy/Mediterranean diet, with whole grains, fruits, vegetable, fish, lean meats, nuts, and olive oil. Limit salt. -recommend moderate walking, 3-5 times/week for 30-50 minutes each session. Aim for at least 150 minutes.week. Goal should be pace of 3 miles/hours, or walking 1.5 miles in 30 minutes -recommend avoidance of tobacco products. Avoid excess alcohol.  Plan for follow up: 6 months or sooner as needed.  Buford Dresser, MD, PhD, [facility]    Medication Adjustments/Labs and Tests Ordered: Current medicines are reviewed at length with the patient today.  Concerns regarding medicines are outlined above.  No orders of the defined types were placed in this encounter.  No orders of the defined types were placed in this encounter.   Patient Instructions  Medication Instructions:  Your Physician recommend you continue on your current medication as directed.    *If you need a refill on your cardiac medications before your next appointment, please call your pharmacy*   Lab Work: None   Testing/Procedures: None   Follow-Up: At [facility], you and your health needs are our priority.  As part of our continuing mission to provide you with exceptional heart care, we have created designated Provider Care Teams.  These Care Teams include your primary Cardiologist (physician) and Advanced Practice Providers (APPs -  Physician Assistants and Nurse Practitioners) who all work together to provide you with the care you  need, when you need it.  We recommend signing up for the patient portal called "MyChart".  Sign up information is provided on this After Visit Summary.  MyChart is used to connect with patients for Virtual Visits (Telemedicine).  Patients are able to view lab/test results, encounter notes, upcoming appointments, etc.  Non-urgent messages can be sent to your provider as well.   To learn more about what you can do with MyChart, go to [email or web address].    Your next appointment:   6 month(s) @ [address] [place], [place] [address]   The format for your next appointment:   In Person  Provider:   Buford Dresser, MD       [facility] Stump[place],acting as a scribe for Buford Dresser, MD.,have documented all relevant documentation on the behalf of Buford Dresser, MD,as directed by  Buford Dresser, MD while in the presence of Buford Dresser, MD.  I, Buford Dresser, MD, have reviewed all documentation for this visit. The documentation on 02/07/21 for the exam, diagnosis, procedures, and orders are all accurate and complete.  Signed, Buford Dresser, MD PhD 02/07/2021 12:59 PM    [place]

## 2021-02-06 ENCOUNTER — Telehealth: Payer: Medicare Other

## 2021-02-06 ENCOUNTER — Telehealth: Payer: Self-pay

## 2021-02-06 NOTE — Progress Notes (Signed)
Tammy,  Thank you for getting back to me! Since he gets that medication through patient assistance, I will redo his application and fax it to your office for you to sign off on. I will have my team call the patient and update him on the dose change.   I appreciate your help!   Angelena Sole, PharmD, CPP Clinical Pharmacist [facility] at [facility]  [phone]

## 2021-02-06 NOTE — Telephone Encounter (Signed)
  Care Management   Follow Up Note   02/06/2021 Name: Kenneth [name] MRN: [id number] DOB: 07/13/1961   Referred by: [name], MD Reason for referral : Care Coordination (HF, COPD, DMII, Anxiety)   An unsuccessful telephone outreach was attempted today. The patient was referred to the case management team for assistance with care management and care coordination.   Follow Up Plan: A HIPPA compliant phone message was left for the patient providing contact information and requesting a return call. RNCM will attempt 2nd telephone call to client within 3 weeks.   George Ina RN,BSN,CCM RN Case Manager [facility] [place]  [phone]

## 2021-02-07 ENCOUNTER — Encounter: Payer: Self-pay | Admitting: Cardiology

## 2021-02-07 ENCOUNTER — Ambulatory Visit (INDEPENDENT_AMBULATORY_CARE_PROVIDER_SITE_OTHER): Payer: Medicare Other | Admitting: Cardiology

## 2021-02-07 ENCOUNTER — Other Ambulatory Visit: Payer: Self-pay

## 2021-02-07 VITALS — BP 122/68 | HR 79 | Ht 72.0 in | Wt 207.0 lb

## 2021-02-07 DIAGNOSIS — Z09 Encounter for follow-up examination after completed treatment for conditions other than malignant neoplasm: Secondary | ICD-10-CM

## 2021-02-07 DIAGNOSIS — G4733 Obstructive sleep apnea (adult) (pediatric): Secondary | ICD-10-CM | POA: Diagnosis not present

## 2021-02-07 DIAGNOSIS — N1831 Chronic kidney disease, stage 3a: Secondary | ICD-10-CM

## 2021-02-07 DIAGNOSIS — I10 Essential (primary) hypertension: Secondary | ICD-10-CM | POA: Diagnosis not present

## 2021-02-07 DIAGNOSIS — I480 Paroxysmal atrial fibrillation: Secondary | ICD-10-CM

## 2021-02-07 DIAGNOSIS — Z794 Long term (current) use of insulin: Secondary | ICD-10-CM | POA: Diagnosis not present

## 2021-02-07 DIAGNOSIS — J9611 Chronic respiratory failure with hypoxia: Secondary | ICD-10-CM | POA: Diagnosis not present

## 2021-02-07 DIAGNOSIS — E1122 Type 2 diabetes mellitus with diabetic chronic kidney disease: Secondary | ICD-10-CM

## 2021-02-07 DIAGNOSIS — I5042 Chronic combined systolic (congestive) and diastolic (congestive) heart failure: Secondary | ICD-10-CM | POA: Diagnosis not present

## 2021-02-07 NOTE — Patient Instructions (Signed)
Medication Instructions:  Your Physician recommend you continue on your current medication as directed.    *If you need a refill on your cardiac medications before your next appointment, please call your pharmacy*   Lab Work: None   Testing/Procedures: None   Follow-Up: At [facility], you and your health needs are our priority.  As part of our continuing mission to provide you with exceptional heart care, we have created designated Provider Care Teams.  These Care Teams include your primary Cardiologist (physician) and Advanced Practice Providers (APPs -  Physician Assistants and Nurse Practitioners) who all work together to provide you with the care you need, when you need it.  We recommend signing up for the patient portal called "MyChart".  Sign up information is provided on this After Visit Summary.  MyChart is used to connect with patients for Virtual Visits (Telemedicine).  Patients are able to view lab/test results, encounter notes, upcoming appointments, etc.  Non-urgent messages can be sent to your provider as well.   To learn more about what you can do with MyChart, go to [email or web address].    Your next appointment:   6 month(s) @ [address] [place], [place] [address]   The format for your next appointment:   In Person  Provider:   Bridgette Christopher, MD    

## 2021-02-07 NOTE — Chronic Care Management (AMB) (Signed)
  Care Management   Note  02/07/2021 Name: Kenneth [name] MRN: [id number] DOB: 20-May-1961  Kenneth [name] is a 60 y.o. year old male who is a primary care patient of Veto Kemps, Bertram Millard, MD and is actively engaged with the care management team. I reached out to Gwenith Daily by phone today to assist with re-scheduling a follow up visit with the RN Case Manager  Follow up plan: Telephone appointment with care management team member scheduled for:02/13/2021  [facility] Guide, Embedded Care Coordination [facility] Management

## 2021-02-08 ENCOUNTER — Encounter: Payer: Self-pay | Admitting: Family Medicine

## 2021-02-08 ENCOUNTER — Ambulatory Visit (INDEPENDENT_AMBULATORY_CARE_PROVIDER_SITE_OTHER): Payer: Medicare Other | Admitting: Family Medicine

## 2021-02-08 VITALS — BP 132/70 | HR 96 | Temp 96.8°F | Ht 72.0 in | Wt 206.4 lb

## 2021-02-08 DIAGNOSIS — E1159 Type 2 diabetes mellitus with other circulatory complications: Secondary | ICD-10-CM

## 2021-02-08 DIAGNOSIS — I480 Paroxysmal atrial fibrillation: Secondary | ICD-10-CM

## 2021-02-08 DIAGNOSIS — I5042 Chronic combined systolic (congestive) and diastolic (congestive) heart failure: Secondary | ICD-10-CM | POA: Diagnosis not present

## 2021-02-08 DIAGNOSIS — E785 Hyperlipidemia, unspecified: Secondary | ICD-10-CM | POA: Diagnosis not present

## 2021-02-08 DIAGNOSIS — Z794 Long term (current) use of insulin: Secondary | ICD-10-CM | POA: Diagnosis not present

## 2021-02-08 DIAGNOSIS — I152 Hypertension secondary to endocrine disorders: Secondary | ICD-10-CM | POA: Diagnosis not present

## 2021-02-08 DIAGNOSIS — K219 Gastro-esophageal reflux disease without esophagitis: Secondary | ICD-10-CM | POA: Diagnosis not present

## 2021-02-08 DIAGNOSIS — E1140 Type 2 diabetes mellitus with diabetic neuropathy, unspecified: Secondary | ICD-10-CM | POA: Diagnosis not present

## 2021-02-08 DIAGNOSIS — J441 Chronic obstructive pulmonary disease with (acute) exacerbation: Secondary | ICD-10-CM

## 2021-02-08 MED ORDER — PREDNISONE 10 MG PO TABS: 10.0000 mg | ORAL_TABLET | Freq: Every day | ORAL | 6 refills | Status: DC

## 2021-02-08 MED ORDER — GABAPENTIN 100 MG PO CAPS: 200.0000 mg | ORAL_CAPSULE | Freq: Three times a day (TID) | ORAL | 5 refills | Status: DC | PRN

## 2021-02-08 MED ORDER — DALIRESP 500 MCG PO TABS: 500.0000 ug | ORAL_TABLET | Freq: Every day | ORAL | 3 refills | Status: AC

## 2021-02-08 MED ORDER — LANTUS SOLOSTAR 100 UNIT/ML ~~LOC~~ SOPN: 50.0000 [IU] | PEN_INJECTOR | Freq: Every day | SUBCUTANEOUS | 2 refills | Status: DC

## 2021-02-08 MED ORDER — FAMOTIDINE 20 MG PO TABS: 20.0000 mg | ORAL_TABLET | Freq: Every day | ORAL | 3 refills | Status: DC

## 2021-02-08 NOTE — Progress Notes (Signed)
[facility] [address] [place] [address] Dept: [phone] Dept Fax: [phone]  Office Visit  Subjective:    Patient ID: Kenneth [name], male    DOB: 11/07/1960, 60 y.o..   MRN: [id number]  Chief Complaint  Patient presents with  . Hospitalization Follow-up    F/u hospital on 01/28/21.  Still getting SOB with walking.  Needs refill on the Famotidine.     History of Present Illness:  Patient is in today for Hospital follow-up. Kenneth [name] has had 2 admissions since I last saw him. Each tim e was for exacerbation of his COPD, requiring Bi-pap, antibiotics, and increased steroids. He notes that he is doing well back at home currently.  He is on 3 lpm of continuous O2. Home health is engaged and he has found this helpful. He had a recent day with lower leg edema that has since resolved. Although his med list and last discharge summary showed a prednisone taper, he notes he is taking a daily prednisone pill.  Kenneth [name] notes he is currently taking 50 units of insulin glargine. He does note occasional nocturnal hypoglycemia (BS in 60s). He states his fasting blood sugars are usually in the 100-200 range.  Kenneth [name] continues to take gabapentin, but notes he only uses this occasionally. He feels he developed an issue with his grip strength that he attributes to gabapentin, so no longer uses this regularly.  Past Medical History: Patient Active Problem List   Diagnosis Date Noted  . Chronic kidney disease, stage 3a ([place]) 01/29/2021  . Acute on chronic combined systolic and diastolic CHF (congestive heart failure) ([place]) 01/22/2021  . Mixed diabetic hyperlipidemia associated with type 2 diabetes mellitus ([place]) 01/22/2021  . Tinea pedis 01/15/2021  . Pes planus 01/15/2021  . Chronic respiratory failure with hypoxia ([place]) 12/07/2020  . Oral candidiasis 12/07/2020  . Cardiac arrest ([place]) 10/04/2020  . Acute kidney injury superimposed  on CKD ([place]) 09/23/2020  . Thiamine deficiency 07/02/2020  . Obstructive sleep apnea 05/02/2020  . Hyperlipidemia   . Chronic combined systolic and diastolic congestive heart failure ([place]) 04/07/2020  . Chronic constipation 07/19/2019  . Snoring 03/29/2019  . Insomnia due to other mental disorder 11/15/2018  . Anticoagulant long-term use 10/21/2018  . Gastroesophageal reflux disease without esophagitis 08/10/2018  . Tobacco abuse 08/10/2018  . Chest pain 07/27/2018  . Hypertension associated with diabetes ([place]) 07/06/2018  . Chronic obstructive pulmonary disease ([place]) 07/06/2018  . Alcohol abuse 07/06/2018  . Paroxysmal atrial fibrillation ([place]) 11/28/2017  . Slow transit constipation 06/07/2016  . Chronic anemia 06/06/2016  . Hypertensive kidney disease with chronic kidney disease stage III ([place]) 06/06/2016  . Mixed anxiety and depressive disorder 06/05/2016  . Type 2 diabetes mellitus with stage 3a chronic kidney disease, with long-term current use of insulin ([place]) 06/05/2016   Past Surgical History:  Procedure Laterality Date  . NO PAST SURGERIES     Family History  Problem Relation Age of Onset  . Diabetes Mother   . Heart disease Mother   . Diabetes Father   . Heart disease Father   . Diabetes Sister   . Heart disease Sister   . Kidney disease Sister   . Coronary artery disease Brother   . Liver disease Maternal Uncle    Outpatient Medications Prior to Visit  Medication Sig Dispense Refill  . acetaminophen (TYLENOL) 325 MG tablet Take 650 mg by mouth every 6 (six) hours as needed for mild pain  or headache.    . albuterol (PROVENTIL) (2.5 MG/3ML) 0.083% nebulizer solution Take 3 mLs (2.5 mg total) by nebulization every 6 (six) hours as needed for shortness of breath.    [name] amLODipine (NORVASC) 5 MG tablet Take 5 mg by mouth every morning.    . ARIPiprazole (ABILIFY) 5 MG tablet Take 7 mg by mouth daily.    . Blood Glucose Monitoring Suppl (ONE TOUCH ULTRA 2) w/Device  KIT Use to test blood sugars 1-2 times daily. 1 kit 0  . busPIRone (BUSPAR) 15 MG tablet Take 1 tablet (15 mg total) by mouth 2 (two) times daily. 180 tablet 1  . carvedilol (COREG) 12.5 MG tablet Take 1 tablet (12.5 mg total) by mouth 2 (two) times daily with a meal. 180 tablet 0  . clotrimazole (MYCELEX) 10 MG troche Take 1 tablet (10 mg total) by mouth 5 (five) times daily. 35 Troche 0  . DM-GG & DM-APAP-CPM (CORICIDIN HBP DAY/NIGHT COLD) 10-20 &15-200-2 MG MISC Take 1 tablet by mouth every 12 (twelve) hours as needed (cold symptoms).    [name] doxycycline (VIBRA-TABS) 100 MG tablet Take 1 tablet (100 mg total) by mouth every 12 (twelve) hours. 2 tablet 0  . ELIQUIS 5 MG TABS tablet TAKE ONE TABLET BY MOUTH EVERY MORNING and TAKE ONE TABLET BY MOUTH EVERY EVENING 180 tablet 1  . FLUoxetine (PROZAC) 20 MG capsule Take 60 mg by mouth daily.    . Fluticasone-Umeclidin-Vilant (TRELEGY ELLIPTA) 100-62.5-25 MCG/INH AEPB Inhale 1 puff into the lungs daily. 60 each 5  . furosemide (LASIX) 40 MG tablet Take 1 tablet (40 mg total) by mouth 2 (two) times daily. 60 tablet 11  . glucose blood (ONETOUCH ULTRA) test strip USE TO TEST BLOOD SUGAR UP TO FOUR TIMES DAILY 200 each 11  . guaiFENesin (MUCINEX) 600 MG 12 hr tablet Take 1 tablet (600 mg total) by mouth 2 (two) times daily. 30 tablet 0  . hydrALAZINE (APRESOLINE) 25 MG tablet Take 25 mg by mouth 3 (three) times daily.    . Insulin Pen Needle (PEN NEEDLES) 31G X 6 MM MISC 1 application by Does not apply route at bedtime. 100 each 1  . ipratropium-albuterol (DUONEB) 0.5-2.5 (3) MG/3ML SOLN Take 3 mLs by nebulization every 6 (six) hours as needed. 360 mL 0  . isosorbide mononitrate (IMDUR) 30 MG 24 hr tablet Take 30 mg by mouth daily.    [name] ketoconazole (NIZORAL) 2 % cream Apply 1 application topically daily. 60 g 2  . Lancets (ONETOUCH ULTRASOFT) lancets USE TO TEST BLOOD SUGAR 1-2 TIMES DAILY 100 each 11  . lubiprostone ([name]) 8 MCG capsule Take 1 capsule  (8 mcg total) by mouth 2 (two) times daily with a meal. 60 capsule 2  . Multiple Vitamins-Minerals (ONE-A-DAY MENS 50+) TABS Take 1 tablet by mouth daily.    . nitroGLYCERIN (NITROSTAT) 0.4 MG SL tablet DISSOLVE 1 TABLET UNDER THE TONGUE EVERY 5 MINUTES AS&nbsp;&nbsp;NEEDED FOR CHEST PAIN. MAX&nbsp;&nbsp;OF 3 TABLETS IN 15 MINUTES. CALL 911 IF PAIN PERSISTS. (Patient taking differently: Place 0.4 mg under the tongue every 5 (five) minutes as needed for chest pain.) 75 tablet 4  . pantoprazole (PROTONIX) 40 MG tablet TAKE ONE TABLET BY MOUTH EVERY MORNING (Patient taking differently: Take 40 mg by mouth daily.) 30 tablet 4  . pseudoephedrine (SUDAFED) 60 MG tablet Take 60 mg by mouth every 4 (four) hours as needed for congestion.    . rosuvastatin (CRESTOR) 20 MG tablet TAKE ONE TABLET BY  MOUTH ONCE DAILY 30 tablet 3  . spironolactone (ALDACTONE) 25 MG tablet Take 25 mg by mouth daily.    . famotidine (PEPCID) 20 MG tablet Take 20 mg by mouth daily.    [name] gabapentin (NEURONTIN) 100 MG capsule Take 2 capsules (200 mg total) by mouth 3 (three) times daily. (Patient taking differently: Take 200 mg by mouth 3 (three) times daily as needed (pain).) 180 capsule 3  . insulin glargine (LANTUS SOLOSTAR) 100 UNIT/ML Solostar Pen Inject 55 Units into the skin at bedtime. If fasting blood sugars remain >150 for 1 week, increase insulin dose by 5 units. Maximum of 80 units. (Patient taking differently: Inject 50 Units into the skin at bedtime. If fasting blood sugars remain >150 for 1 week, increase insulin dose by 5 units. Maximum of 80 units.) 15 mL 2  . predniSONE (DELTASONE) 10 MG tablet Take 40 mg daily for 2 days, 30 mg daily for 2 days, 20 mg daily for 2 days,10 mg daily for 1 days, then stop (Patient taking differently: Take 10 mg by mouth 2 (two) times daily with a meal. Take 40 mg daily for 2 days, 30 mg daily for 2 days, 20 mg daily for 2 days,10 mg daily for 1 days, then stop) 19 tablet 0  . Roflumilast  (DALIRESP) 250 MCG TABS Take 250 mcg by mouth daily. 30 tablet 11   No facility-administered medications prior to visit.   Allergies  Allergen Reactions  . Lisinopril Swelling  . Other Other (See Comments)    Lettuce : rash  . Tomato Rash   Objective:   Today's Vitals   02/08/21 1126  BP: 132/70  Pulse: 96  Temp: (!) 96.8 F (36 C)  TempSrc: Temporal  SpO2: 96%  Weight: 206 lb 6.4 oz (93.6 kg)  Height: 6' (1.829 m)   Body mass index is 27.99 kg/m.   General: Well developed, well nourished. No acute distress. Lungs: Distant breath sounds bilaterally. No wheezing, rales or rhonchi. Extremities: Trace edema noted. Psych: Alert and oriented. Normal mood and affect.  Health Maintenance Due  Topic Date Due  . COLONOSCOPY (Pts 45-39yr Insurance coverage will need to be confirmed)  09/23/2019  . OPHTHALMOLOGY EXAM  05/12/2020     Assessment & Plan:   1. Type 2 diabetes mellitus with diabetic neuropathy, with long-term current use of insulin ([facility] Mr. DTomeiappears to be having better blood sugar control at this point. The [facility] pharmacist had checked with me last week and we decreased his Lantus to 50 units daily. I would still consider the addition of empagliflozin if needed for further blood sugar control. I will plan to have him back in 1 month and we will get fasting labs at that point. I will update his gabapentin Rx to reflect his PRN use.  - gabapentin (NEURONTIN) 100 MG capsule; Take 2 capsules (200 mg total) by mouth 3 (three) times daily as needed (pain).  Dispense: 180 capsule; Refill: 5 - insulin glargine (LANTUS SOLOSTAR) 100 UNIT/ML Solostar Pen; Inject 50 Units into the skin at bedtime.  Dispense: 15 mL; Refill: 2  2. Hypertension associated with diabetes ([place] Blood pressure is acceptable on amlodipine, carvedilol, furosemide, hydralazine, and spironolactone.  3. Chronic obstructive pulmonary disease with acute exacerbation ([place] I reviewed both recent  discharge summaries and his last pulmonology consult form April. The pulmonologist had been maintaining Mr. DSweedenon a daily 10 mg prednisone dose. I updated his medication list to reflect this. He has been  on the initial Daliresp dose of 250 mcg daily for more than a month. I went ahead and increased this to 500 mcg daily, which is the therapeutic dose level.  - predniSONE (DELTASONE) 10 MG tablet; Take 1 tablet (10 mg total) by mouth daily with breakfast.  Dispense: 30 tablet; Refill: 6 - roflumilast (DALIRESP) 500 MCG TABS tablet; Take 1 tablet (500 mcg total) by mouth daily.  Dispense: 90 tablet; Refill: 3  4. Hyperlipidemia, unspecified hyperlipidemia type On atorvastatin. Will check fasting lipids at his next visit.  5. Gastroesophageal reflux disease without esophagitis I will renew his famotidine.  - famotidine (PEPCID) 20 MG tablet; Take 1 tablet (20 mg total) by mouth daily.  Dispense: 90 tablet; Refill: 3  6. Paroxysmal atrial fibrillation (HCC) Rate is at goal. Continue anticoagulation.  7. Chronic combined systolic and diastolic congestive heart failure [facility]) Reviewed recent cardiology notes. He is stable and compensated on his current regimen of carvedilol, spironlactone, and Lasix for his HFrEF.   [name], MD

## 2021-02-11 ENCOUNTER — Telehealth: Payer: Self-pay

## 2021-02-11 NOTE — Telephone Encounter (Signed)
PA approved through 09/21/2021.  Pharmacy notified VIA phone.  Dm/cma

## 2021-02-11 NOTE — Telephone Encounter (Signed)
PA for Daliresp 500 mcg submitted though cover my meds to [employer] and awaiting response. Dm/cma  (Key: [id number]

## 2021-02-13 ENCOUNTER — Telehealth: Payer: Medicare Other

## 2021-02-14 ENCOUNTER — Other Ambulatory Visit: Payer: Self-pay | Admitting: Family

## 2021-02-14 DIAGNOSIS — J449 Chronic obstructive pulmonary disease, unspecified: Secondary | ICD-10-CM | POA: Diagnosis not present

## 2021-02-16 ENCOUNTER — Encounter (HOSPITAL_COMMUNITY): Payer: Self-pay

## 2021-02-16 ENCOUNTER — Other Ambulatory Visit: Payer: Self-pay

## 2021-02-16 ENCOUNTER — Emergency Department (HOSPITAL_COMMUNITY): Payer: Medicare Other

## 2021-02-16 ENCOUNTER — Inpatient Hospital Stay (HOSPITAL_COMMUNITY)
Admission: EM | Admit: 2021-02-16 | Discharge: 2021-02-19 | DRG: 291 | Disposition: A | Discharge status: Home / Self Care | Payer: Medicare Other | Attending: Family Medicine | Admitting: Family Medicine

## 2021-02-16 DIAGNOSIS — R000 Tachycardia, unspecified: Secondary | ICD-10-CM | POA: Diagnosis not present

## 2021-02-16 DIAGNOSIS — Z8249 Family history of ischemic heart disease and other diseases of the circulatory system: Secondary | ICD-10-CM | POA: Diagnosis not present

## 2021-02-16 DIAGNOSIS — J441 Chronic obstructive pulmonary disease with (acute) exacerbation: Secondary | ICD-10-CM

## 2021-02-16 DIAGNOSIS — Z9114 Patient's other noncompliance with medication regimen: Secondary | ICD-10-CM | POA: Diagnosis not present

## 2021-02-16 DIAGNOSIS — E1122 Type 2 diabetes mellitus with diabetic chronic kidney disease: Secondary | ICD-10-CM | POA: Diagnosis not present

## 2021-02-16 DIAGNOSIS — N179 Acute kidney failure, unspecified: Secondary | ICD-10-CM | POA: Diagnosis present

## 2021-02-16 DIAGNOSIS — F419 Anxiety disorder, unspecified: Secondary | ICD-10-CM | POA: Diagnosis present

## 2021-02-16 DIAGNOSIS — J9621 Acute and chronic respiratory failure with hypoxia: Secondary | ICD-10-CM | POA: Diagnosis not present

## 2021-02-16 DIAGNOSIS — Z841 Family history of disorders of kidney and ureter: Secondary | ICD-10-CM

## 2021-02-16 DIAGNOSIS — I5043 Acute on chronic combined systolic (congestive) and diastolic (congestive) heart failure: Secondary | ICD-10-CM | POA: Diagnosis not present

## 2021-02-16 DIAGNOSIS — I5042 Chronic combined systolic (congestive) and diastolic (congestive) heart failure: Secondary | ICD-10-CM

## 2021-02-16 DIAGNOSIS — J9601 Acute respiratory failure with hypoxia: Secondary | ICD-10-CM | POA: Diagnosis not present

## 2021-02-16 DIAGNOSIS — Z20822 Contact with and (suspected) exposure to covid-19: Secondary | ICD-10-CM | POA: Diagnosis not present

## 2021-02-16 DIAGNOSIS — I130 Hypertensive heart and chronic kidney disease with heart failure and stage 1 through stage 4 chronic kidney disease, or unspecified chronic kidney disease: Secondary | ICD-10-CM | POA: Diagnosis not present

## 2021-02-16 DIAGNOSIS — E785 Hyperlipidemia, unspecified: Secondary | ICD-10-CM | POA: Diagnosis present

## 2021-02-16 DIAGNOSIS — Z743 Need for continuous supervision: Secondary | ICD-10-CM | POA: Diagnosis not present

## 2021-02-16 DIAGNOSIS — Z794 Long term (current) use of insulin: Secondary | ICD-10-CM

## 2021-02-16 DIAGNOSIS — Z7952 Long term (current) use of systemic steroids: Secondary | ICD-10-CM

## 2021-02-16 DIAGNOSIS — F32A Depression, unspecified: Secondary | ICD-10-CM | POA: Diagnosis not present

## 2021-02-16 DIAGNOSIS — G4733 Obstructive sleep apnea (adult) (pediatric): Secondary | ICD-10-CM | POA: Diagnosis present

## 2021-02-16 DIAGNOSIS — N1831 Chronic kidney disease, stage 3a: Secondary | ICD-10-CM | POA: Diagnosis not present

## 2021-02-16 DIAGNOSIS — Z833 Family history of diabetes mellitus: Secondary | ICD-10-CM | POA: Diagnosis not present

## 2021-02-16 DIAGNOSIS — Z7901 Long term (current) use of anticoagulants: Secondary | ICD-10-CM | POA: Diagnosis not present

## 2021-02-16 DIAGNOSIS — Z9981 Dependence on supplemental oxygen: Secondary | ICD-10-CM

## 2021-02-16 DIAGNOSIS — I480 Paroxysmal atrial fibrillation: Secondary | ICD-10-CM | POA: Diagnosis not present

## 2021-02-16 DIAGNOSIS — J9622 Acute and chronic respiratory failure with hypercapnia: Secondary | ICD-10-CM | POA: Diagnosis present

## 2021-02-16 DIAGNOSIS — J439 Emphysema, unspecified: Secondary | ICD-10-CM | POA: Diagnosis not present

## 2021-02-16 DIAGNOSIS — Z79899 Other long term (current) drug therapy: Secondary | ICD-10-CM

## 2021-02-16 DIAGNOSIS — Z87891 Personal history of nicotine dependence: Secondary | ICD-10-CM | POA: Diagnosis not present

## 2021-02-16 DIAGNOSIS — R4020 Unspecified coma: Secondary | ICD-10-CM | POA: Diagnosis not present

## 2021-02-16 DIAGNOSIS — I499 Cardiac arrhythmia, unspecified: Secondary | ICD-10-CM | POA: Diagnosis not present

## 2021-02-16 DIAGNOSIS — R6889 Other general symptoms and signs: Secondary | ICD-10-CM | POA: Diagnosis not present

## 2021-02-16 DIAGNOSIS — R404 Transient alteration of awareness: Secondary | ICD-10-CM | POA: Diagnosis not present

## 2021-02-16 LAB — I-STAT VENOUS BLOOD GAS, ED
Acid-Base Excess: 9 mmol/L — ABNORMAL HIGH (ref 0.0–2.0)
Bicarbonate: 35.4 mmol/L — ABNORMAL HIGH (ref 20.0–28.0)
Calcium, Ion: 1.02 mmol/L — ABNORMAL LOW (ref 1.15–1.40)
HCT: 32 % — ABNORMAL LOW (ref 39.0–52.0)
Hemoglobin: 10.9 g/dL — ABNORMAL LOW (ref 13.0–17.0)
O2 Saturation: 97 %
Potassium: 3.8 mmol/L (ref 3.5–5.1)
Sodium: 136 mmol/L (ref 135–145)
TCO2: 37 mmol/L — ABNORMAL HIGH (ref 22–32)
pCO2, Ven: 60.4 mmHg — ABNORMAL HIGH (ref 44.0–60.0)
pH, Ven: 7.376 (ref 7.250–7.430)
pO2, Ven: 91 mmHg — ABNORMAL HIGH (ref 32.0–45.0)

## 2021-02-16 LAB — COMPREHENSIVE METABOLIC PANEL
ALT: 32 U/L (ref 0–44)
AST: 31 U/L (ref 15–41)
Albumin: 3.6 g/dL (ref 3.5–5.0)
Alkaline Phosphatase: 58 U/L (ref 38–126)
Anion gap: 11 (ref 5–15)
BUN: 11 mg/dL (ref 6–20)
CO2: 33 mmol/L — ABNORMAL HIGH (ref 22–32)
Calcium: 8.6 mg/dL — ABNORMAL LOW (ref 8.9–10.3)
Chloride: 94 mmol/L — ABNORMAL LOW (ref 98–111)
Creatinine, Ser: 1.72 mg/dL — ABNORMAL HIGH (ref 0.61–1.24)
GFR, Estimated: 45 mL/min — ABNORMAL LOW (ref >60)
Glucose, Bld: 221 mg/dL — ABNORMAL HIGH (ref 70–99)
Potassium: 3.9 mmol/L (ref 3.5–5.1)
Sodium: 138 mmol/L (ref 135–145)
Total Bilirubin: 0.4 mg/dL (ref 0.3–1.2)
Total Protein: 6.1 g/dL — ABNORMAL LOW (ref 6.5–8.1)

## 2021-02-16 LAB — TROPONIN I (HIGH SENSITIVITY): Troponin I (High Sensitivity): 36 ng/L — ABNORMAL HIGH (ref <18)

## 2021-02-16 LAB — CBC WITH DIFFERENTIAL/PLATELET
Abs Immature Granulocytes: 0.09 10*3/uL — ABNORMAL HIGH (ref 0.00–0.07)
Basophils Absolute: 0 10*3/uL (ref 0.0–0.1)
Basophils Relative: 1 %
Eosinophils Absolute: 0.1 10*3/uL (ref 0.0–0.5)
Eosinophils Relative: 2 %
HCT: 34.8 % — ABNORMAL LOW (ref 39.0–52.0)
Hemoglobin: 9.9 g/dL — ABNORMAL LOW (ref 13.0–17.0)
Immature Granulocytes: 1 %
Lymphocytes Relative: 23 %
Lymphs Abs: 2 10*3/uL (ref 0.7–4.0)
MCH: 26.7 pg (ref 26.0–34.0)
MCHC: 28.4 g/dL — ABNORMAL LOW (ref 30.0–36.0)
MCV: 93.8 fL (ref 80.0–100.0)
Monocytes Absolute: 0.8 10*3/uL (ref 0.1–1.0)
Monocytes Relative: 10 %
Neutro Abs: 5.3 10*3/uL (ref 1.7–7.7)
Neutrophils Relative %: 63 %
Platelets: 260 10*3/uL (ref 150–400)
RBC: 3.71 MIL/uL — ABNORMAL LOW (ref 4.22–5.81)
RDW: 13.8 % (ref 11.5–15.5)
WBC: 8.3 10*3/uL (ref 4.0–10.5)
nRBC: 0 % (ref 0.0–0.2)

## 2021-02-16 LAB — SAMPLE TO BLOOD BANK

## 2021-02-16 LAB — BRAIN NATRIURETIC PEPTIDE: B Natriuretic Peptide: 509.8 pg/mL — ABNORMAL HIGH (ref 0.0–100.0)

## 2021-02-16 LAB — RESP PANEL BY RT-PCR (FLU A&B, COVID) ARPGX2
Influenza A by PCR: NEGATIVE
Influenza B by PCR: NEGATIVE
SARS Coronavirus 2 by RT PCR: NEGATIVE

## 2021-02-16 MED ORDER — ALBUTEROL (5 MG/ML) CONTINUOUS INHALATION SOLN: 10.0000 mg/h | INHALATION_SOLUTION | RESPIRATORY_TRACT | Status: DC

## 2021-02-16 MED ORDER — ALBUTEROL (5 MG/ML) CONTINUOUS INHALATION SOLN
INHALATION_SOLUTION | RESPIRATORY_TRACT | Status: AC
  Administered 2021-02-16: 10 mg/h via RESPIRATORY_TRACT
  Filled 2021-02-16: qty 20

## 2021-02-16 MED ORDER — METHYLPREDNISOLONE SODIUM SUCC 125 MG IJ SOLR
125.0000 mg | Freq: Once | INTRAMUSCULAR | Status: AC
  Administered 2021-02-16: 125 mg via INTRAVENOUS
  Filled 2021-02-16: qty 2

## 2021-02-16 NOTE — ED Triage Notes (Signed)
Pt bib EMS from home after 911 call due to resp distress. Pt has been SOB all week and has been using nebs at home. Pt RR 40-50 and was grunting and diaphoretic; O2 sat 70% at home while on neb. Pt has absent breath sounds in upper lobes per EMS; rales in lower lobes. Pt received 5 albuterol PTA.

## 2021-02-16 NOTE — ED Notes (Signed)
Pt having increased work of breathing. Pt on continuous neb and O2 sat 99%. Pt grunting, sitting upright, and leaning up for breaths. Dr. Clarene Duke notified and new order received to place pt on bipap with continuous neb through bipap. Respiratory notified and at bedside.

## 2021-02-17 ENCOUNTER — Encounter (HOSPITAL_COMMUNITY): Payer: Self-pay | Admitting: Family Medicine

## 2021-02-17 ENCOUNTER — Inpatient Hospital Stay (HOSPITAL_COMMUNITY): Payer: Medicare Other

## 2021-02-17 DIAGNOSIS — N179 Acute kidney failure, unspecified: Secondary | ICD-10-CM | POA: Diagnosis not present

## 2021-02-17 DIAGNOSIS — Z794 Long term (current) use of insulin: Secondary | ICD-10-CM | POA: Diagnosis not present

## 2021-02-17 DIAGNOSIS — Z20822 Contact with and (suspected) exposure to covid-19: Secondary | ICD-10-CM | POA: Diagnosis not present

## 2021-02-17 DIAGNOSIS — J9622 Acute and chronic respiratory failure with hypercapnia: Secondary | ICD-10-CM

## 2021-02-17 DIAGNOSIS — Z7952 Long term (current) use of systemic steroids: Secondary | ICD-10-CM | POA: Diagnosis not present

## 2021-02-17 DIAGNOSIS — I130 Hypertensive heart and chronic kidney disease with heart failure and stage 1 through stage 4 chronic kidney disease, or unspecified chronic kidney disease: Secondary | ICD-10-CM | POA: Diagnosis not present

## 2021-02-17 DIAGNOSIS — I5043 Acute on chronic combined systolic (congestive) and diastolic (congestive) heart failure: Secondary | ICD-10-CM

## 2021-02-17 DIAGNOSIS — N1831 Chronic kidney disease, stage 3a: Secondary | ICD-10-CM | POA: Diagnosis not present

## 2021-02-17 DIAGNOSIS — Z8249 Family history of ischemic heart disease and other diseases of the circulatory system: Secondary | ICD-10-CM | POA: Diagnosis not present

## 2021-02-17 DIAGNOSIS — E785 Hyperlipidemia, unspecified: Secondary | ICD-10-CM | POA: Diagnosis not present

## 2021-02-17 DIAGNOSIS — Z833 Family history of diabetes mellitus: Secondary | ICD-10-CM | POA: Diagnosis not present

## 2021-02-17 DIAGNOSIS — Z79899 Other long term (current) drug therapy: Secondary | ICD-10-CM | POA: Diagnosis not present

## 2021-02-17 DIAGNOSIS — J441 Chronic obstructive pulmonary disease with (acute) exacerbation: Secondary | ICD-10-CM | POA: Diagnosis not present

## 2021-02-17 DIAGNOSIS — G4733 Obstructive sleep apnea (adult) (pediatric): Secondary | ICD-10-CM | POA: Diagnosis not present

## 2021-02-17 DIAGNOSIS — J449 Chronic obstructive pulmonary disease, unspecified: Secondary | ICD-10-CM | POA: Diagnosis not present

## 2021-02-17 DIAGNOSIS — I480 Paroxysmal atrial fibrillation: Secondary | ICD-10-CM | POA: Diagnosis present

## 2021-02-17 DIAGNOSIS — Z87891 Personal history of nicotine dependence: Secondary | ICD-10-CM | POA: Diagnosis not present

## 2021-02-17 DIAGNOSIS — Z7901 Long term (current) use of anticoagulants: Secondary | ICD-10-CM | POA: Diagnosis not present

## 2021-02-17 DIAGNOSIS — F32A Depression, unspecified: Secondary | ICD-10-CM | POA: Diagnosis not present

## 2021-02-17 DIAGNOSIS — F419 Anxiety disorder, unspecified: Secondary | ICD-10-CM | POA: Diagnosis present

## 2021-02-17 DIAGNOSIS — Z841 Family history of disorders of kidney and ureter: Secondary | ICD-10-CM | POA: Diagnosis not present

## 2021-02-17 DIAGNOSIS — E1122 Type 2 diabetes mellitus with diabetic chronic kidney disease: Secondary | ICD-10-CM | POA: Diagnosis not present

## 2021-02-17 DIAGNOSIS — J9621 Acute and chronic respiratory failure with hypoxia: Secondary | ICD-10-CM | POA: Diagnosis not present

## 2021-02-17 DIAGNOSIS — Z9981 Dependence on supplemental oxygen: Secondary | ICD-10-CM | POA: Diagnosis not present

## 2021-02-17 DIAGNOSIS — Z9114 Patient's other noncompliance with medication regimen: Secondary | ICD-10-CM | POA: Diagnosis not present

## 2021-02-17 DIAGNOSIS — I5042 Chronic combined systolic (congestive) and diastolic (congestive) heart failure: Secondary | ICD-10-CM | POA: Diagnosis not present

## 2021-02-17 LAB — ECHOCARDIOGRAM COMPLETE
AR max vel: 1.22 cm2
AV Area VTI: 1.28 cm2
AV Area mean vel: 1.4 cm2
AV Mean grad: 12 mmHg
AV Peak grad: 24.4 mmHg
Ao pk vel: 2.47 m/s
Area-P 1/2: 3.06 cm2
Height: 72 in
P 1/2 time: 418 msec
S' Lateral: 3.55 cm
Weight: 3350.99 oz

## 2021-02-17 LAB — GLUCOSE, CAPILLARY
Glucose-Capillary: 264 mg/dL — ABNORMAL HIGH (ref 70–99)
Glucose-Capillary: 234 mg/dL — ABNORMAL HIGH (ref 70–99)
Glucose-Capillary: 146 mg/dL — ABNORMAL HIGH (ref 70–99)
Glucose-Capillary: 88 mg/dL (ref 70–99)
Glucose-Capillary: 188 mg/dL — ABNORMAL HIGH (ref 70–99)

## 2021-02-17 LAB — TROPONIN I (HIGH SENSITIVITY)
Troponin I (High Sensitivity): 76 ng/L — ABNORMAL HIGH (ref <18)
Troponin I (High Sensitivity): 67 ng/L — ABNORMAL HIGH (ref <18)
Troponin I (High Sensitivity): 64 ng/L — ABNORMAL HIGH (ref <18)

## 2021-02-17 LAB — CBC
HCT: 32.7 % — ABNORMAL LOW (ref 39.0–52.0)
Hemoglobin: 9.3 g/dL — ABNORMAL LOW (ref 13.0–17.0)
MCH: 26.3 pg (ref 26.0–34.0)
MCHC: 28.4 g/dL — ABNORMAL LOW (ref 30.0–36.0)
MCV: 92.4 fL (ref 80.0–100.0)
Platelets: 207 10*3/uL (ref 150–400)
RBC: 3.54 MIL/uL — ABNORMAL LOW (ref 4.22–5.81)
RDW: 13.8 % (ref 11.5–15.5)
WBC: 10.6 10*3/uL — ABNORMAL HIGH (ref 4.0–10.5)
nRBC: 0 % (ref 0.0–0.2)

## 2021-02-17 MED ORDER — ONDANSETRON HCL 4 MG/2ML IJ SOLN: 4.0000 mg | Freq: Four times a day (QID) | INTRAMUSCULAR | Status: DC | PRN

## 2021-02-17 MED ORDER — IPRATROPIUM-ALBUTEROL 0.5-2.5 (3) MG/3ML IN SOLN: 3.0000 mL | Freq: Four times a day (QID) | RESPIRATORY_TRACT | Status: DC | PRN

## 2021-02-17 MED ORDER — AMLODIPINE BESYLATE 5 MG PO TABS
5.0000 mg | ORAL_TABLET | Freq: Every morning | ORAL | Status: DC
  Administered 2021-02-17: 5 mg via ORAL
  Filled 2021-02-17: qty 1

## 2021-02-17 MED ORDER — CARVEDILOL 6.25 MG PO TABS
6.2500 mg | ORAL_TABLET | Freq: Two times a day (BID) | ORAL | Status: DC
  Administered 2021-02-17 – 2021-02-19 (×4): 6.25 mg via ORAL
  Filled 2021-02-17 (×4): qty 1

## 2021-02-17 MED ORDER — UMECLIDINIUM BROMIDE 62.5 MCG/INH IN AEPB
1.0000 | INHALATION_SPRAY | Freq: Every day | RESPIRATORY_TRACT | Status: DC
  Administered 2021-02-17 – 2021-02-19 (×3): 1 via RESPIRATORY_TRACT
  Filled 2021-02-17: qty 7

## 2021-02-17 MED ORDER — PANTOPRAZOLE SODIUM 40 MG PO TBEC
40.0000 mg | DELAYED_RELEASE_TABLET | Freq: Every day | ORAL | Status: DC
  Administered 2021-02-17 – 2021-02-19 (×3): 40 mg via ORAL
  Filled 2021-02-17 (×3): qty 1

## 2021-02-17 MED ORDER — FLUTICASONE-UMECLIDIN-VILANT 100-62.5-25 MCG/INH IN AEPB: 1.0000 | INHALATION_SPRAY | Freq: Every day | RESPIRATORY_TRACT | Status: DC

## 2021-02-17 MED ORDER — SACUBITRIL-VALSARTAN 24-26 MG PO TABS
1.0000 | ORAL_TABLET | Freq: Two times a day (BID) | ORAL | Status: DC
  Administered 2021-02-17 – 2021-02-19 (×5): 1 via ORAL
  Filled 2021-02-17 (×5): qty 1

## 2021-02-17 MED ORDER — SODIUM CHLORIDE 0.9 % IV SOLN: 250.0000 mL | INTRAVENOUS | Status: DC | PRN

## 2021-02-17 MED ORDER — SODIUM CHLORIDE 0.9% FLUSH
3.0000 mL | Freq: Two times a day (BID) | INTRAVENOUS | Status: DC
  Administered 2021-02-17 – 2021-02-19 (×5): 3 mL via INTRAVENOUS

## 2021-02-17 MED ORDER — WITCH HAZEL-GLYCERIN EX PADS
MEDICATED_PAD | CUTANEOUS | Status: DC | PRN
  Filled 2021-02-17: qty 100

## 2021-02-17 MED ORDER — APIXABAN 5 MG PO TABS
5.0000 mg | ORAL_TABLET | Freq: Two times a day (BID) | ORAL | Status: DC
  Administered 2021-02-17 – 2021-02-19 (×5): 5 mg via ORAL
  Filled 2021-02-17 (×5): qty 1

## 2021-02-17 MED ORDER — FUROSEMIDE 40 MG PO TABS
40.0000 mg | ORAL_TABLET | Freq: Two times a day (BID) | ORAL | Status: DC
  Administered 2021-02-17: 40 mg via ORAL
  Filled 2021-02-17: qty 1

## 2021-02-17 MED ORDER — INSULIN ASPART 100 UNIT/ML IJ SOLN: 0.0000 [IU] | Freq: Every day | INTRAMUSCULAR | Status: DC

## 2021-02-17 MED ORDER — PREDNISONE 20 MG PO TABS
40.0000 mg | ORAL_TABLET | Freq: Every day | ORAL | Status: DC
  Administered 2021-02-18 – 2021-02-19 (×2): 40 mg via ORAL
  Filled 2021-02-17 (×2): qty 2

## 2021-02-17 MED ORDER — FUROSEMIDE 10 MG/ML IJ SOLN
40.0000 mg | Freq: Two times a day (BID) | INTRAMUSCULAR | Status: DC
  Administered 2021-02-17 – 2021-02-18 (×4): 40 mg via INTRAVENOUS
  Filled 2021-02-17 (×5): qty 4

## 2021-02-17 MED ORDER — ROSUVASTATIN CALCIUM 20 MG PO TABS
20.0000 mg | ORAL_TABLET | Freq: Every day | ORAL | Status: DC
  Administered 2021-02-17 – 2021-02-19 (×3): 20 mg via ORAL
  Filled 2021-02-17 (×3): qty 1

## 2021-02-17 MED ORDER — SODIUM CHLORIDE 0.9% FLUSH: 3.0000 mL | INTRAVENOUS | Status: DC | PRN

## 2021-02-17 MED ORDER — CARVEDILOL 12.5 MG PO TABS
12.5000 mg | ORAL_TABLET | Freq: Two times a day (BID) | ORAL | Status: DC
  Administered 2021-02-17: 12.5 mg via ORAL
  Filled 2021-02-17: qty 1

## 2021-02-17 MED ORDER — INSULIN ASPART 100 UNIT/ML IJ SOLN
0.0000 [IU] | Freq: Three times a day (TID) | INTRAMUSCULAR | Status: DC
  Administered 2021-02-17: 1 [IU] via SUBCUTANEOUS
  Administered 2021-02-17: 5 [IU] via SUBCUTANEOUS
  Administered 2021-02-17 – 2021-02-18 (×3): 3 [IU] via SUBCUTANEOUS
  Administered 2021-02-18: 1 [IU] via SUBCUTANEOUS

## 2021-02-17 MED ORDER — ALBUTEROL (5 MG/ML) CONTINUOUS INHALATION SOLN
10.0000 mg/h | INHALATION_SOLUTION | RESPIRATORY_TRACT | Status: DC
  Administered 2021-02-17: 10 mg/h via RESPIRATORY_TRACT
  Filled 2021-02-17: qty 20

## 2021-02-17 MED ORDER — DOXYCYCLINE HYCLATE 100 MG PO TABS
100.0000 mg | ORAL_TABLET | Freq: Two times a day (BID) | ORAL | Status: DC
  Administered 2021-02-17 – 2021-02-19 (×5): 100 mg via ORAL
  Filled 2021-02-17 (×6): qty 1

## 2021-02-17 MED ORDER — BUSPIRONE HCL 5 MG PO TABS
15.0000 mg | ORAL_TABLET | Freq: Two times a day (BID) | ORAL | Status: DC
  Administered 2021-02-17 – 2021-02-19 (×5): 15 mg via ORAL
  Filled 2021-02-17 (×5): qty 1

## 2021-02-17 MED ORDER — SPIRONOLACTONE 25 MG PO TABS
25.0000 mg | ORAL_TABLET | Freq: Every day | ORAL | Status: DC
  Administered 2021-02-17 – 2021-02-19 (×3): 25 mg via ORAL
  Filled 2021-02-17 (×3): qty 1

## 2021-02-17 MED ORDER — INSULIN GLARGINE 100 UNIT/ML ~~LOC~~ SOLN
50.0000 [IU] | Freq: Every day | SUBCUTANEOUS | Status: DC
  Filled 2021-02-17 (×2): qty 0.5

## 2021-02-17 MED ORDER — HYDRALAZINE HCL 25 MG PO TABS
25.0000 mg | ORAL_TABLET | Freq: Three times a day (TID) | ORAL | Status: DC
  Administered 2021-02-17 – 2021-02-19 (×7): 25 mg via ORAL
  Filled 2021-02-17 (×7): qty 1

## 2021-02-17 MED ORDER — DM-GG & DM-APAP-CPM 10-20 &15-200-2 MG PO MISC: 1.0000 | Freq: Two times a day (BID) | ORAL | Status: DC | PRN

## 2021-02-17 MED ORDER — HYDROCORTISONE ACETATE 25 MG RE SUPP
25.0000 mg | Freq: Two times a day (BID) | RECTAL | Status: DC
  Administered 2021-02-17 – 2021-02-19 (×4): 25 mg via RECTAL
  Filled 2021-02-17 (×4): qty 1

## 2021-02-17 MED ORDER — FLUTICASONE FUROATE-VILANTEROL 100-25 MCG/INH IN AEPB
1.0000 | INHALATION_SPRAY | Freq: Every day | RESPIRATORY_TRACT | Status: DC
  Administered 2021-02-17 – 2021-02-19 (×3): 1 via RESPIRATORY_TRACT
  Filled 2021-02-17: qty 28

## 2021-02-17 MED ORDER — INSULIN GLARGINE 100 UNIT/ML ~~LOC~~ SOLN
10.0000 [IU] | Freq: Once | SUBCUTANEOUS | Status: AC
  Administered 2021-02-18: 10 [IU] via SUBCUTANEOUS
  Filled 2021-02-17: qty 0.1

## 2021-02-17 MED ORDER — ARIPIPRAZOLE 5 MG PO TABS
7.5000 mg | ORAL_TABLET | Freq: Every day | ORAL | Status: DC
  Administered 2021-02-17 – 2021-02-19 (×3): 7.5 mg via ORAL
  Filled 2021-02-17 (×3): qty 2

## 2021-02-17 MED ORDER — ACETAMINOPHEN 325 MG PO TABS
650.0000 mg | ORAL_TABLET | ORAL | Status: DC | PRN
  Administered 2021-02-17 – 2021-02-19 (×5): 650 mg via ORAL
  Filled 2021-02-17 (×4): qty 2

## 2021-02-17 MED ORDER — FLUOXETINE HCL 20 MG PO CAPS
60.0000 mg | ORAL_CAPSULE | Freq: Every day | ORAL | Status: DC
  Administered 2021-02-17 – 2021-02-19 (×3): 60 mg via ORAL
  Filled 2021-02-17 (×3): qty 3

## 2021-02-17 MED ORDER — ISOSORBIDE MONONITRATE ER 30 MG PO TB24
30.0000 mg | ORAL_TABLET | Freq: Every day | ORAL | Status: DC
  Administered 2021-02-17 – 2021-02-19 (×3): 30 mg via ORAL
  Filled 2021-02-17 (×3): qty 1

## 2021-02-17 MED ORDER — ROFLUMILAST 500 MCG PO TABS
500.0000 ug | ORAL_TABLET | Freq: Every day | ORAL | Status: DC
  Administered 2021-02-17 – 2021-02-19 (×3): 500 ug via ORAL
  Filled 2021-02-17 (×3): qty 1

## 2021-02-17 MED ORDER — NITROGLYCERIN 0.4 MG SL SUBL: 0.4000 mg | SUBLINGUAL_TABLET | SUBLINGUAL | Status: DC | PRN

## 2021-02-17 NOTE — Progress Notes (Signed)
PROGRESS NOTE   Kenneth [name]  KVQ:[id number] DOB: 1960/11/13 DOA: 02/16/2021 PCP: [name], MD  Brief Narrative:  55 white male Known COPD Gold stage III-IV FEV1 25%, CRF 3 L O2 baseline--follows with Dr. Delton Coombes of pulmonology (still smoker exclamation)--- HFrEF-->HF improved EF 55-60% (not on Entresto-?  Angioedema?  AKI) P A. fib/Eliquis CAD >3 (not on aspirin as is on Eliquis) CKD 3 8 DM TY 2-poor control A1c 14.4 on 12/23/2020 HTN HLD OSA Bipolar  Recent admission 5 9--512 hypoxic respiratory failure Rx BiPAP- Follow-up visit Dr. Janne Napoleon 5/19-, office visit Dyane Dustman in outpatient setting he was started on chronic prednisone 10 mg  Presents [facility] ED 5/28 SOB x1 week RR 40-50-grunting diaphoretic-O2 sat 70% received albuterol 5 prior to admission On arrival placed on BiPAP in ED--this was eventually weaned to 4 L early morning 5/29  Hospital-Problem based course Decompensated mild heart failure-BNP 509 Orthopneic on exam today with SOB laying flat Will change Lasix to IV 40 twice daily-tells me his baseline weight is about 206 pounds he is currently 209 Admits to some dietary indiscretions in addition Continue Aldactone 25, Entresto resumed this admission-cut back Coreg to 6.25 Continue hydralazine 25 3 times daily-(doubtful he will take a 3 times daily med) Stop amlodipine at this time check a.m. magnesium Mild increase in troponin likely tachycardia mediated/mediated by heart failure Trends of gone from the 30s to 70s I will get an EKG-if no damage pattern will probably just [facility] Dr. Cristal Deer peripherally aware for [facility] visit in the outpatient setting Possible decompensated COPD Continue DuoNeb, Daliresp, Incruse Ellipta/Breo Ellipta Would not stress dose steroids any further instead placed on low pulse of 40 mg for 3 days Paroxysmal A. fib CHADS2 score >3 not on aspirin/on apixaban Drop in beta-blocker dose given heart failure as above Continue  apixaban Keep on monitors today DM TY 2 poor control CBGs 2 21-2 64 as expected with recent steroids eating 90% of meals Sliding scale alone + Lantus 50 daily Hold gabapentin 100 3 times daily for now OSA on CPAP Continue CPAP Will need outpatient follow-up with Dr. Corinda Gubler pulmonology to adjust and comment on COPD  DVT prophylaxis: Apixaban Code Status: Full Family Communication: None-lives with wife and daughter at home Disposition:  Status is: Inpatient  Remains inpatient appropriate because:Hemodynamically unstable and Inpatient level of care appropriate due to severity of illness   Dispo: The patient is from: Home              Anticipated d/c is to: Home              Patient currently is not medically stable to d/c.   Difficult to place patient Yes       Consultants:   None as yet  Procedures: No  Antimicrobials: Doxycycline since admission 5/28   Subjective:  Breathing feels better Still short winded I took him off his baseline 2 L of oxygen-he is satting over 100% Tells me he feels short of breath when I lay him flat to look at his neck veins He has mild lower extremity edema He admits to increase in weight over the past several days to weeks  Objective: Vitals:   02/17/21 0322 02/17/21 0554 02/17/21 0625 02/17/21 0759  BP: (!) 145/69  129/75   Pulse: 92 90 89   Resp: 18 17    Temp: 97.7 F (36.5 C)     TempSrc: Oral     SpO2: 100% 100%  100%  Weight:  95 kg     Height: 6' (1.829 m)       Intake/Output Summary (Last 24 hours) at 02/17/2021 0925 Last data filed at 02/17/2021 0828 Gross per 24 hour  Intake 480 ml  Output --  Net 480 ml   Filed Weights   02/16/21 2129 02/17/21 0322  Weight: 93.6 kg 95 kg    Examination:  Pleasant black male no distress mild JVD Some crackles posterolaterally no wheeze S1-S2 no murmur Abdomen soft no rebound Trace lower extremity edema No focal deficit power 5/5   Data Reviewed: personally reviewed    CBC    Component Value Date/Time   WBC 10.6 (H) 02/17/2021 0532   RBC 3.54 (L) 02/17/2021 0532   HGB 9.3 (L) 02/17/2021 0532   HCT 32.7 (L) 02/17/2021 0532   PLT 207 02/17/2021 0532   MCV 92.4 02/17/2021 0532   MCH 26.3 02/17/2021 0532   MCHC 28.4 (L) 02/17/2021 0532   RDW 13.8 02/17/2021 0532   LYMPHSABS 2.0 02/16/2021 2132   MONOABS 0.8 02/16/2021 2132   EOSABS 0.1 02/16/2021 2132   BASOSABS 0.0 02/16/2021 2132   CMP Latest Ref Rng & Units 02/16/2021 02/16/2021 01/29/2021  Glucose 70 - 99 mg/dL - 283(M) -  BUN 6 - 20 mg/dL - 11 -  Creatinine 6.29 - 1.24 mg/dL - 4.76(L) -  Sodium 465 - 145 mmol/L 136 138 140  Potassium 3.5 - 5.1 mmol/L 3.8 3.9 2.8(L)  Chloride 98 - 111 mmol/L - 94(L) -  CO2 22 - 32 mmol/L - 33(H) -  Calcium 8.9 - 10.3 mg/dL - 8.6(L) -  Total Protein 6.5 - 8.1 g/dL - 6.1(L) -  Total Bilirubin 0.3 - 1.2 mg/dL - 0.4 -  Alkaline Phos 38 - 126 U/L - 58 -  AST 15 - 41 U/L - 31 -  ALT 0 - 44 U/L - 32 -     Radiology Studies: DG Chest Port 1 View  Result Date: 02/16/2021 CLINICAL DATA:  Respiratory distress for 1 day EXAM: PORTABLE CHEST 1 VIEW COMPARISON:  01/29/2021 FINDINGS: 2 frontal views of the chest demonstrate a stable cardiac silhouette. Stable emphysema, with no acute airspace disease, effusion, or pneumothorax. No acute bony abnormality. IMPRESSION: 1. Stable emphysema.  No acute process. Electronically Signed   By: Sharlet Salina M.D.   On: 02/16/2021 21:49     Scheduled Meds: . amLODipine  5 mg Oral q morning  . apixaban  5 mg Oral BID  . ARIPiprazole  7.5 mg Oral Daily  . busPIRone  15 mg Oral BID  . carvedilol  12.5 mg Oral BID WC  . doxycycline  100 mg Oral Q12H  . FLUoxetine  60 mg Oral Daily  . fluticasone furoate-vilanterol  1 puff Inhalation Daily   And  . umeclidinium bromide  1 puff Inhalation Daily  . furosemide  40 mg Oral BID  . hydrALAZINE  25 mg Oral TID  . insulin aspart  0-5 Units Subcutaneous QHS  . insulin aspart  0-9  Units Subcutaneous TID WC  . insulin glargine  50 Units Subcutaneous QHS  . isosorbide mononitrate  30 mg Oral Daily  . pantoprazole  40 mg Oral Daily  . roflumilast  500 mcg Oral Daily  . rosuvastatin  20 mg Oral Daily  . sacubitril-valsartan  1 tablet Oral BID  . sodium chloride flush  3 mL Intravenous Q12H  . spironolactone  25 mg Oral Daily   Continuous Infusions: . sodium chloride    .  albuterol 10 mg/hr (02/17/21 0003)     LOS: 0 days   Time spent: 37 minutes  Rhetta Mura, MD [facility] To contact the attending provider between 7A-7P or the covering provider during after hours 7P-7A, please log into the web site [email or web address] and access using universal [place] password for that web site. If you do not have the password, please call the hospital operator.  02/17/2021, 9:25 AM

## 2021-02-17 NOTE — H&P (Signed)
History and Physical    Kenneth [name] WFU:[id number] DOB: September 01, 1961 DOA: 02/16/2021  PCP: [name], MD   Patient coming from: Home  Chief Complaint: SOB  HPI: Kenneth [name] is a 60 y.o. male with medical history significant for COPD and asthma (he PFTs in August has been when showed FEV1 of 25%), chronic respiratory failure on 3 L of oxygen by nasal cannula at home, systolic and diastolic CHF with echo in January 2022 showed an EF of 55 to 60%, paroxysmal atrial fibrillation on Eliquis, CKD stage IIIa, diabetes mellitus type 2 insulin-dependent, hypertension, hyperlipidemia, OSA on CPAP at night who presents by EMS with shortness of breath.  Reportedly patient was initially evaluated by EMS as oxygen saturation was in the 70s at home.  He had used a breathing treatment at home but did not have significant improvement.  He was grunting and diaphoretic.  He was given a DuoNeb treatment in route.  In the emergency room he was received continuous albuterol breathing treatment.  He was laying on the couch watching a movie with his wife when he suddenly became very short of breath and had to going to the bathroom and take a breathing treatment.  Denies having any chest pain or pressure.  He states he did not have any fever or chills.  He has an occasional cough that is nonproductive which is normal for him and unchanged from his baseline.  He reports has been compliant with all of his medications.  Level before the sudden episode of shortness of breath while sitting on the couch.  Denies any change in diet and no new exposures to chemicals, plants or pets.  Lives with his wife.  No known exposures to COVID-19 or anyone that is sick. Has history of tobacco use but quit 4 months ago.  ED Course: Interim he was transitioned to BiPAP therapy.  VBG showed hypercapnia with pH of 7.376, PCO2 of 60.4, PO2 of 91, bicarb of 37.  BNP elevated at 509.8.  WBC 8300, hemoglobin 9.9, hematocrit 34.8, platelet 260.   Sodium is 138 potassium 3.9 chloride 94 bicarb 33, glucose 221, creatinine 1.72, BUN 11, LFTs normal.  Chest x-ray did not show any pulmonary edema or infiltrate.  Review of Systems:  General: Denies fever, chills, weight loss, night sweats.  Denies dizziness.  Denies change in appetite HENT: Denies head trauma, headache, denies change in hearing, tinnitus.  Denies nasal congestion or bleeding.  Denies sore throat, sores in mouth.  Denies difficulty swallowing Eyes: Denies blurry vision, pain in eye, drainage.  Denies discoloration of eyes. Neck: Denies pain.  Denies swelling.  Denies pain with movement. Cardiovascular: Denies chest pain, palpitations.  Reports mild edema.  Reports orthopnea Respiratory: Reports shortness of breath, cough.  Denies wheezing.  Denies sputum production Gastrointestinal: Denies abdominal pain, swelling.  Denies nausea, vomiting, diarrhea.  Denies melena.  Denies hematemesis. Musculoskeletal: Denies limitation of movement.  Denies deformity or swelling.  Denies pain.  Denies arthralgias or myalgias. Genitourinary: Denies pelvic pain.  Denies urinary frequency or hesitancy.  Denies dysuria.  Skin: Denies rash.  Denies petechiae, purpura, ecchymosis. Neurologic: Denies headache, denies paresthesia.  Denies syncope. Denies slurred speech, drooping face.  Denies visual change. Psychiatric: Denies depression, anxiety. Denies hallucinations.  Past Medical History:  Diagnosis Date  . Anxiety   . Arthritis   . Atrial fibrillation ([place])   . Chest tightness 06/12/2019  . CHF (congestive heart failure) ([place])   . COPD (chronic obstructive pulmonary disease) ([place])  emphysema  . Depression   . Diabetes mellitus without complication ([place])   . Heart murmur   . Hyperlipidemia   . Hypertension   . Sleep apnea with use of continuous positive airway pressure (CPAP)     Past Surgical History:  Procedure Laterality Date  . NO PAST SURGERIES      Social History  reports  that he quit smoking about 7 months ago. His smoking use included cigarettes. He has a 75.00 pack-year smoking history. He has never used smokeless tobacco. He reports previous alcohol use. He reports current drug use. Frequency: 1.00 time per week. Drug: Marijuana.  Allergies  Allergen Reactions  . Lisinopril Swelling  . Other Other (See Comments)    Lettuce : rash  . Tomato Rash    Family History  Problem Relation Age of Onset  . Diabetes Mother   . Heart disease Mother   . Diabetes Father   . Heart disease Father   . Diabetes Sister   . Heart disease Sister   . Kidney disease Sister   . Coronary artery disease Brother   . Liver disease Maternal Uncle      Prior to Admission medications   Medication Sig Start Date End Date Taking? Authorizing Provider  acetaminophen (TYLENOL) 325 MG tablet Take 650 mg by mouth every 6 (six) hours as needed for mild pain or headache.   Yes [provider]  amLODipine (NORVASC) 5 MG tablet Take 5 mg by mouth every morning. 10/30/20  Yes [provider]  ARIPiprazole (ABILIFY) 5 MG tablet Take 7.5 mg by mouth daily.   Yes [provider]  Blood Glucose Monitoring Suppl (ONE TOUCH ULTRA 2) w/Device KIT Use to test blood sugars 1-2 times daily. 04/02/20  Yes [name], MD  busPIRone (BUSPAR) 15 MG tablet Take 1 tablet (15 mg total) by mouth 2 (two) times daily. 09/06/19  Yes [name], MD  carvedilol (COREG) 12.5 MG tablet Take 1 tablet (12.5 mg total) by mouth 2 (two) times daily with a meal. 01/28/21  Yes Buford Dresser, MD  clotrimazole (MYCELEX) 10 MG troche Take 1 tablet (10 mg total) by mouth 5 (five) times daily. 12/07/20  Yes Parrett, Tammy S, NP  DM-GG & DM-APAP-CPM (CORICIDIN HBP DAY/NIGHT COLD) 10-20 &15-200-2 MG MISC Take 1 tablet by mouth every 12 (twelve) hours as needed (cold symptoms).   Yes [provider]  ELIQUIS 5 MG TABS tablet TAKE ONE TABLET BY MOUTH EVERY MORNING  and TAKE ONE TABLET BY MOUTH EVERY EVENING Patient taking differently: Take 5 mg by mouth 2 (two) times daily. 10/22/20  Yes Buford Dresser, MD  famotidine (PEPCID) 20 MG tablet Take 1 tablet (20 mg total) by mouth daily. 02/08/21  Yes [name], MD  FLUoxetine (PROZAC) 20 MG capsule Take 60 mg by mouth daily.   Yes [provider]  Fluticasone-Umeclidin-Vilant (TRELEGY ELLIPTA) 100-62.5-25 MCG/INH AEPB Inhale 1 puff into the lungs daily. 04/02/20  Yes Collene Gobble, MD  furosemide (LASIX) 40 MG tablet Take 1 tablet (40 mg total) by mouth 2 (two) times daily. 08/02/20  Yes Buford Dresser, MD  gabapentin (NEURONTIN) 100 MG capsule Take 2 capsules (200 mg total) by mouth 3 (three) times daily as needed (pain). Patient taking differently: Take 100 mg by mouth 3 (three) times daily. 02/08/21  Yes [name], MD  glucose blood [facility] ULTRA) test strip USE TO TEST BLOOD SUGAR UP TO FOUR TIMES DAILY 01/29/21  Yes [name],  [name], MD  guaiFENesin (MUCINEX) 600 MG 12 hr tablet Take 1 tablet (600 mg total) by mouth 2 (two) times daily. 09/24/20  Yes Regalado, Belkys A, MD  hydrALAZINE (APRESOLINE) 25 MG tablet Take 25 mg by mouth 3 (three) times daily. 01/25/21  Yes [provider]  insulin glargine (LANTUS SOLOSTAR) 100 UNIT/ML Solostar Pen Inject 50 Units into the skin at bedtime. 02/08/21  Yes [name], MD  Insulin Pen Needle (PEN NEEDLES) 31G X 6 MM MISC 1 application by Does not apply route at bedtime. 11/03/20  Yes Nche, Charlene Brooke, NP  ipratropium-albuterol (DUONEB) 0.5-2.5 (3) MG/3ML SOLN Take 3 mLs by nebulization every 6 (six) hours as needed. 09/24/20  Yes Regalado, Belkys A, MD  isosorbide mononitrate (IMDUR) 30 MG 24 hr tablet Take 30 mg by mouth daily. 07/04/20  Yes [provider]  ketoconazole (NIZORAL) 2 % cream Apply 1 application topically daily. 01/15/21  Yes McDonald, Stephan Minister, DPM  Lancets [facility] ULTRASOFT) lancets USE TO TEST BLOOD  SUGAR 1-2 TIMES DAILY 05/22/20  Yes [name], MD  lubiprostone [facility] 8 MCG capsule Take 1 capsule (8 mcg total) by mouth 2 (two) times daily with a meal. 01/28/21  Yes [name], MD  Multiple Vitamins-Minerals (ONE-A-DAY MENS 50+) TABS Take 1 tablet by mouth daily.   Yes [provider]  nitroGLYCERIN (NITROSTAT) 0.4 MG SL tablet DISSOLVE 1 TABLET UNDER THE TONGUE EVERY 5 MINUTES AS&nbsp;&nbsp;NEEDED FOR CHEST PAIN. MAX&nbsp;&nbsp;OF 3 TABLETS IN 15 MINUTES. CALL 911 IF PAIN PERSISTS. Patient taking differently: Place 0.4 mg under the tongue every 5 (five) minutes as needed for chest pain. 07/25/20  Yes [name], MD  pantoprazole (PROTONIX) 40 MG tablet TAKE ONE TABLET BY MOUTH EVERY MORNING Patient taking differently: Take 40 mg by mouth daily. 10/29/20  Yes Esterwood, Amy S, PA-C  predniSONE (DELTASONE) 10 MG tablet Take 1 tablet (10 mg total) by mouth daily with breakfast. 02/08/21  Yes [name], MD  pseudoephedrine (SUDAFED) 60 MG tablet Take 60 mg by mouth every 4 (four) hours as needed for congestion.   Yes [provider]  roflumilast (DALIRESP) 500 MCG TABS tablet Take 1 tablet (500 mcg total) by mouth daily. 02/08/21  Yes [name], MD  rosuvastatin (CRESTOR) 20 MG tablet TAKE ONE TABLET BY MOUTH ONCE DAILY Patient taking differently: Take 20 mg by mouth daily. 10/29/20  Yes Dutch Quint B, FNP  spironolactone (ALDACTONE) 25 MG tablet Take 25 mg by mouth daily.   Yes [provider]  albuterol (PROVENTIL) (2.5 MG/3ML) 0.083% nebulizer solution Take 3 mLs (2.5 mg total) by nebulization every 6 (six) hours as needed for shortness of breath. Patient not taking: No sig reported 12/25/20   Aline August, MD  doxycycline (VIBRA-TABS) 100 MG tablet Take 1 tablet (100 mg total) by mouth every 12 (twelve) hours. Patient not taking: No sig reported 01/31/21   Jonetta Osgood, MD    Physical Exam: Vitals:   02/16/21 2315  02/16/21 2330 02/16/21 2345 02/17/21 0000  BP: (!) 140/91 (!) 152/79 139/77 117/90  Pulse: 99 97 96 94  Resp: 20 15 (!) 24 17  Temp:      TempSrc:      SpO2: 100% 100% 100% 100%  Weight:      Height:        Constitutional: NAD, calm, comfortable Vitals:   02/16/21 2315 02/16/21 2330 02/16/21 2345 02/17/21 0000  BP: (!) 140/91 (!) 152/79 139/77  117/90  Pulse: 99 97 96 94  Resp: 20 15 (!) 24 17  Temp:      TempSrc:      SpO2: 100% 100% 100% 100%  Weight:      Height:       Constitutional: Awake alert and oriented x3, patient is currently on BiPAP. Skin: no rashes, no lesions, good skin turgor noted. Eyes: EOMI, PERRL.  No evidence of scleral icterus or conjunctival pallor.  ENMT: BiPAP mask in place, moist mucous membranes noted. Nares patent without epistaxis Neck: normal, supple, no masses, no thyromegaly.  No evidence of jugular venous distension.   Respiratory: Markedly diminished breath sounds in all fields with faint expiratory wheezing that is intermittent with prolonged expiratory phase.  No evidence of rales.  Patient is exhibiting respiratory distress without evidence of accessory muscle use.   Cardiovascular: Regular rate and rhythm, no murmurs / rubs / gallops. Mild lower extremity edema. 2+ pedal pulses. No carotid bruits.  Chest:   Nontender without crepitus or deformity.   Back:   Nontender without crepitus or deformity. Abdomen: Abdomen is soft and nontender.  No evidence of intra-abdominal masses.  Positive bowel sounds noted in all quadrants.   Musculoskeletal: No joint deformity upper and lower extremities. Good ROM, no contractures. Normal muscle tone.  Neurologic: CN 2-12 grossly intact. Sensation intact.  Patient moving all 4 extremities spontaneously.  Patient is following all commands.  Patient is responsive to verbal stimuli.   Psychiatric: Patient exhibits normal mood with appropriate affect.   Labs on Admission: I have personally reviewed following labs  and imaging studies  CBC: Recent Labs  Lab 02/16/21 2132 02/16/21 2147  WBC 8.3  --   NEUTROABS 5.3  --   HGB 9.9* 10.9*  HCT 34.8* 32.0*  MCV 93.8  --   PLT 260  --     Basic Metabolic Panel: Recent Labs  Lab 02/16/21 2132 02/16/21 2147  NA 138 136  K 3.9 3.8  CL 94*  --   CO2 33*  --   GLUCOSE 221*  --   BUN 11  --   CREATININE 1.72*  --   CALCIUM 8.6*  --     GFR: Estimated Creatinine Clearance: 54.9 mL/min (A) (by C-G formula based on SCr of 1.72 mg/dL (H)).  Liver Function Tests: Recent Labs  Lab 02/16/21 2132  AST 31  ALT 32  ALKPHOS 58  BILITOT 0.4  PROT 6.1*  ALBUMIN 3.6    Urine analysis:    Component Value Date/Time   COLORURINE AMBER (A) 10/03/2020 2036   APPEARANCEUR CLOUDY (A) 10/03/2020 2036   LABSPEC 1.016 10/03/2020 2036   PHURINE 6.0 10/03/2020 2036   GLUCOSEU >=500 (A) 10/03/2020 2036   GLUCOSEU NEGATIVE 05/20/2019 0825   HGBUR NEGATIVE 10/03/2020 2036   BILIRUBINUR NEGATIVE 10/03/2020 2036   KETONESUR NEGATIVE 10/03/2020 2036   PROTEINUR >=300 (A) 10/03/2020 2036   UROBILINOGEN 1.0 05/20/2019 0825   NITRITE NEGATIVE 10/03/2020 2036   LEUKOCYTESUR NEGATIVE 10/03/2020 2036    Radiological Exams on Admission: DG Chest Port 1 View  Result Date: 02/16/2021 CLINICAL DATA:  Respiratory distress for 1 day EXAM: PORTABLE CHEST 1 VIEW COMPARISON:  01/29/2021 FINDINGS: 2 frontal views of the chest demonstrate a stable cardiac silhouette. Stable emphysema, with no acute airspace disease, effusion, or pneumothorax. No acute bony abnormality. IMPRESSION: 1. Stable emphysema.  No acute process. Electronically Signed   By: Randa Ngo M.D.   On: 02/16/2021 21:49  EKG: Independently reviewed.  EKG shows sinus tachycardia with LVH by voltage criteria.  Lateral T wave changes but no acute ST elevation or depression.  QTc 457  Assessment/Plan Principal Problem:   COPD with acute exacerbation Mr. Caradine is admitted to cardiac telemetry floor  on BiPAP.  VBG done in the emergency room showed hypercapnia.  Patient was hypoxic on room air initially however by EMS according to report.  Patient has history of COPD and uses oxygen at 3 L/min at home by nasal cannula chronically.  He is tolerating BiPAP well.  Chest x-ray does not show any pulmonary edema or infiltrate. Placed on doxycycline about coverage with acute exacerbation of COPD. Given Solu-Medrol in the emergency room and will continue Solu-Medrol overnight every 8 hours. Will wean BiPAP as tolerated starting in the morning. Aggressive bronchodilator therapy.  DuoNebs ordered.  Patient is receiving continuous albuterol nebulizer treatment in the emergency room  Active Problems:   Acute on chronic combined systolic and diastolic CHF (congestive heart failure)  Has mildly elevated BNP.  No signs of volume overload.  Continue with home dose of Lasix 40 mg twice a day.  Monitor I&O's and daily weight.  Check serial troponin levels. Troponin in Er was 36 which is around his baseline level reviewing his chart    Acute on chronic respiratory failure with hypoxia and hypercapnia  Currently on BiPAP therapy that is tolerating well.  Will wean as tolerated.    Type 2 diabetes mellitus with stage 3a chronic kidney disease, with long-term current use of insulin (HCC)   Paroxysmal atrial fibrillation  Continue Coreg and anticoagulation with Eliquis.  Monitor on telemetry    CKD 3a Monitor I&O's.  Recheck creatinine and BUN in the morning.  Minimize nephrotoxic agents    Obstructive sleep apnea Patient on BiPAP at this time.  When BiPAP is weaned we will use CPAP at night when asleep    DVT prophylaxis: Is anticoagulated on Eliquis which will be continued.  Code Status:   Full Code  Family Communication:  Diagnosis and plan discussed with patient and his wife.  They verbalized understanding and agree with plan.  Further recommendations to follow as clinically indicated Disposition  Plan:   Patient is from:  Home  Anticipated DC to:  Home  Anticipated DC date:  Anticipate 2 midnight or more stay in the hospital to treat acute condition  Anticipated DC barriers: No barriers to discharge identified at this time  Admission status:  Inpatient  Yevonne Aline Oda Lansdowne MD [facility]  How to contact the [facility] Attending or Consulting provider [place] or covering provider during after hours [place], for this patient?   1. Check the care team in [facility] and look for a) attending/consulting [facility] provider listed and b) the [facility] team listed 2. Log into [email or web address] and use [place]'s universal password to access. If you do not have the password, please contact the hospital operator. 3. Locate the [facility] provider you are looking for under [facility] and page to a number that you can be directly reached. 4. If you still have difficulty reaching the provider, please page the [facility] (Director on Call) for the Hospitalists listed on amion for assistance.  02/17/2021, 12:48 AM

## 2021-02-17 NOTE — ED Provider Notes (Signed)
[facility] EMERGENCY DEPARTMENT Provider Note   CSN: [id number] Arrival date & time: 02/16/21  2120     History Chief Complaint  Patient presents with  . Respiratory Distress    Kenneth [name] is a 60 y.o. male.  60 year old male with extensive past medical history below including CHF, COPD, atrial fibrillation,HTN, HLD, OSA who p/w respiratory distress.  Patient initially reported to EMS that he has been short of breath all week but he tells me that his shortness of breath began today.  He has been using his inhalers at home without relief.  When EMS arrived, he was grunting and diaphoretic with O2 saturations in the 70s.  EMS gave him a DuoNeb in route.  He has had some mild increased lower extremity edema today.  He denies any associated chest pain.  He has occasional cough, no fevers.  He has been compliant with his medications.  LEVEL 5 CAVEAT DUE TO RESPIRATORY DISTRESS  The history is provided by the patient and the EMS personnel. The history is limited by the condition of the patient.       Past Medical History:  Diagnosis Date  . Anxiety   . Arthritis   . Atrial fibrillation ([place])   . Chest tightness 06/12/2019  . CHF (congestive heart failure) ([place])   . COPD (chronic obstructive pulmonary disease) (HCC)    emphysema  . Depression   . Diabetes mellitus without complication ([place])   . Heart murmur   . Hyperlipidemia   . Hypertension   . Sleep apnea with use of continuous positive airway pressure (CPAP)     Patient Active Problem List   Diagnosis Date Noted  . Chronic kidney disease, stage 3a ([place]) 01/29/2021  . Acute on chronic combined systolic and diastolic CHF (congestive heart failure) ([place]) 01/22/2021  . Mixed diabetic hyperlipidemia associated with type 2 diabetes mellitus ([employer]) 01/22/2021  . Tinea pedis 01/15/2021  . Pes planus 01/15/2021  . Chronic respiratory failure with hypoxia ([place]) 12/07/2020  . Oral candidiasis 12/07/2020  .  Cardiac arrest ([place]) 10/04/2020  . Acute kidney injury superimposed on CKD ([place]) 09/23/2020  . Thiamine deficiency 07/02/2020  . Obstructive sleep apnea 05/02/2020  . Hyperlipidemia   . Chronic combined systolic and diastolic congestive heart failure ([place]) 04/07/2020  . Chronic constipation 07/19/2019  . Snoring 03/29/2019  . Insomnia due to other mental disorder 11/15/2018  . Anticoagulant long-term use 10/21/2018  . Gastroesophageal reflux disease without esophagitis 08/10/2018  . Tobacco abuse 08/10/2018  . Chest pain 07/27/2018  . Hypertension associated with diabetes ([place]) 07/06/2018  . Chronic obstructive pulmonary disease ([place]) 07/06/2018  . Alcohol abuse 07/06/2018  . Paroxysmal atrial fibrillation ([place]) 11/28/2017  . Slow transit constipation 06/07/2016  . Chronic anemia 06/06/2016  . Hypertensive kidney disease with chronic kidney disease stage III ([place]) 06/06/2016  . Mixed anxiety and depressive disorder 06/05/2016  . Type 2 diabetes mellitus with stage 3a chronic kidney disease, with long-term current use of insulin ([place]) 06/05/2016    Past Surgical History:  Procedure Laterality Date  . NO PAST SURGERIES         Family History  Problem Relation Age of Onset  . Diabetes Mother   . Heart disease Mother   . Diabetes Father   . Heart disease Father   . Diabetes Sister   . Heart disease Sister   . Kidney disease Sister   . Coronary artery disease Brother   . Liver disease Maternal Uncle  Social History   Tobacco Use  . Smoking status: Former Smoker    Packs/day: 2.50    Years: 30.00    Pack years: 75.00    Types: Cigarettes    Quit date: 06/2020    Years since quitting: 0.6  . Smokeless tobacco: Never Used  Vaping Use  . Vaping Use: Never used  Substance Use Topics  . Alcohol use: Not Currently    Comment: pt stated that it was 6 months ago he drunk bourbon  . Drug use: Yes    Frequency: 1.0 times per week    Types: Marijuana    Comment: occ  marijuana    Home Medications Prior to Admission medications   Medication Sig Start Date End Date Taking? Authorizing Provider  acetaminophen (TYLENOL) 325 MG tablet Take 650 mg by mouth every 6 (six) hours as needed for mild pain or headache.    [provider]  albuterol (PROVENTIL) (2.5 MG/3ML) 0.083% nebulizer solution Take 3 mLs (2.5 mg total) by nebulization every 6 (six) hours as needed for shortness of breath. 12/25/20   Aline August, MD  amLODipine (NORVASC) 5 MG tablet Take 5 mg by mouth every morning. 10/30/20   [provider]  ARIPiprazole (ABILIFY) 5 MG tablet Take 7 mg by mouth daily.    [provider]  Blood Glucose Monitoring Suppl (ONE TOUCH ULTRA 2) w/Device KIT Use to test blood sugars 1-2 times daily. 04/02/20   [name], MD  busPIRone (BUSPAR) 15 MG tablet Take 1 tablet (15 mg total) by mouth 2 (two) times daily. 09/06/19   [name], MD  carvedilol (COREG) 12.5 MG tablet Take 1 tablet (12.5 mg total) by mouth 2 (two) times daily with a meal. 01/28/21   Buford Dresser, MD  clotrimazole (MYCELEX) 10 MG troche Take 1 tablet (10 mg total) by mouth 5 (five) times daily. 12/07/20   Parrett, Fonnie Mu, NP  DM-GG & DM-APAP-CPM (CORICIDIN HBP DAY/NIGHT COLD) 10-20 &15-200-2 MG MISC Take 1 tablet by mouth every 12 (twelve) hours as needed (cold symptoms).    [provider]  doxycycline (VIBRA-TABS) 100 MG tablet Take 1 tablet (100 mg total) by mouth every 12 (twelve) hours. 01/31/21   Ghimire, Henreitta Leber, MD  ELIQUIS 5 MG TABS tablet TAKE ONE TABLET BY MOUTH EVERY MORNING and TAKE ONE TABLET BY MOUTH EVERY EVENING 10/22/20   Buford Dresser, MD  famotidine (PEPCID) 20 MG tablet Take 1 tablet (20 mg total) by mouth daily. 02/08/21   [name], MD  FLUoxetine (PROZAC) 20 MG capsule Take 60 mg by mouth daily.    [provider]  Fluticasone-Umeclidin-Vilant (TRELEGY ELLIPTA) 100-62.5-25 MCG/INH AEPB Inhale  1 puff into the lungs daily. 04/02/20   Collene Gobble, MD  furosemide (LASIX) 40 MG tablet Take 1 tablet (40 mg total) by mouth 2 (two) times daily. 08/02/20   Buford Dresser, MD  gabapentin (NEURONTIN) 100 MG capsule Take 2 capsules (200 mg total) by mouth 3 (three) times daily as needed (pain). 02/08/21   [name], MD  glucose blood [facility] ULTRA) test strip USE TO TEST BLOOD SUGAR UP TO FOUR TIMES DAILY 01/29/21   [name], MD  guaiFENesin (MUCINEX) 600 MG 12 hr tablet Take 1 tablet (600 mg total) by mouth 2 (two) times daily. 09/24/20   Regalado, Belkys A, MD  hydrALAZINE (APRESOLINE) 25 MG tablet Take 25 mg by mouth 3 (three) times daily. 01/25/21   [provider]  insulin glargine (LANTUS SOLOSTAR) 100 UNIT/ML Solostar Pen Inject 50 Units into the skin at bedtime. 02/08/21   [name], MD  Insulin Pen Needle (PEN NEEDLES) 31G X 6 MM MISC 1 application by Does not apply route at bedtime. 11/03/20   Nche, Charlene Brooke, NP  ipratropium-albuterol (DUONEB) 0.5-2.5 (3) MG/3ML SOLN Take 3 mLs by nebulization every 6 (six) hours as needed. 09/24/20   Regalado, Belkys A, MD  isosorbide mononitrate (IMDUR) 30 MG 24 hr tablet Take 30 mg by mouth daily. 07/04/20   [provider]  ketoconazole (NIZORAL) 2 % cream Apply 1 application topically daily. 01/15/21   Criselda Peaches, DPM  Lancets [facility] ULTRASOFT) lancets USE TO TEST BLOOD SUGAR 1-2 TIMES DAILY 05/22/20   [name], MD  lubiprostone [facility]) 8 MCG capsule Take 1 capsule (8 mcg total) by mouth 2 (two) times daily with a meal. 01/28/21   [name], MD  Multiple Vitamins-Minerals (ONE-A-DAY MENS 50+) TABS Take 1 tablet by mouth daily.    [provider]  nitroGLYCERIN (NITROSTAT) 0.4 MG SL tablet DISSOLVE 1 TABLET UNDER THE TONGUE EVERY 5 MINUTES AS  NEEDED FOR CHEST PAIN. MAX  OF 3 TABLETS IN 15 MINUTES. CALL 911 IF PAIN PERSISTS. Patient taking differently: Place 0.4 mg under the  tongue every 5 (five) minutes as needed for chest pain. 07/25/20   [name], MD  pantoprazole (PROTONIX) 40 MG tablet TAKE ONE TABLET BY MOUTH EVERY MORNING Patient taking differently: Take 40 mg by mouth daily. 10/29/20   Esterwood, Amy S, PA-C  predniSONE (DELTASONE) 10 MG tablet Take 1 tablet (10 mg total) by mouth daily with breakfast. 02/08/21   [name], MD  pseudoephedrine (SUDAFED) 60 MG tablet Take 60 mg by mouth every 4 (four) hours as needed for congestion.    [provider]  roflumilast (DALIRESP) 500 MCG TABS tablet Take 1 tablet (500 mcg total) by mouth daily. 02/08/21   [name], MD  rosuvastatin (CRESTOR) 20 MG tablet TAKE ONE TABLET BY MOUTH ONCE DAILY 10/29/20   Dutch Quint B, FNP  spironolactone (ALDACTONE) 25 MG tablet Take 25 mg by mouth daily.    [provider]    Allergies    Lisinopril, Other, and Tomato  Review of Systems   Review of Systems  Unable to perform ROS: Severe respiratory distress    Physical Exam Updated Vital Signs BP 117/90   Pulse 94   Temp (!) 96.4 F (35.8 C) (Oral)   Resp 17   Ht 6' (1.829 m)   Wt 93.6 kg   SpO2 100%   BMI 27.99 kg/m   Physical Exam Vitals and nursing note reviewed.  Constitutional:      General: He is in acute distress.  HENT:     Head: Normocephalic and atraumatic.  Eyes:     Conjunctiva/sclera: Conjunctivae normal.  Cardiovascular:     Rate and Rhythm: Normal rate and regular rhythm.     Heart sounds: Normal heart sounds. No murmur heard.   Pulmonary:     Effort: Respiratory distress present.     Comments: Respiratory distress w/ prolonged expiratory phase; almost absent breath sounds b/l, no crackles Abdominal:     General: Abdomen is flat. Bowel sounds are normal. There is no distension.     Palpations: Abdomen is soft.     Tenderness: There is no abdominal tenderness.  Musculoskeletal:     Right lower leg: Edema present.  Left lower leg: Edema present.   Skin:    General: Skin is warm and dry.  Neurological:     Mental Status: He is alert and oriented to person, place, and time.     Comments: fluent  Psychiatric:        Mood and Affect: Mood is anxious.     ED Results / Procedures / Treatments   Labs (all labs ordered are listed, but only abnormal results are displayed) Labs Reviewed  COMPREHENSIVE METABOLIC PANEL - Abnormal; Notable for the following components:      Result Value   Chloride 94 (*)    CO2 33 (*)    Glucose, Bld 221 (*)    Creatinine, Ser 1.72 (*)    Calcium 8.6 (*)    Total Protein 6.1 (*)    GFR, Estimated 45 (*)    All other components within normal limits  BRAIN NATRIURETIC PEPTIDE - Abnormal; Notable for the following components:   B Natriuretic Peptide 509.8 (*)    All other components within normal limits  CBC WITH DIFFERENTIAL/PLATELET - Abnormal; Notable for the following components:   RBC 3.71 (*)    Hemoglobin 9.9 (*)    HCT 34.8 (*)    MCHC 28.4 (*)    Abs Immature Granulocytes 0.09 (*)    All other components within normal limits  I-STAT VENOUS BLOOD GAS, ED - Abnormal; Notable for the following components:   pCO2, Ven 60.4 (*)    pO2, Ven 91.0 (*)    Bicarbonate 35.4 (*)    TCO2 37 (*)    Acid-Base Excess 9.0 (*)    Calcium, Ion 1.02 (*)    HCT 32.0 (*)    Hemoglobin 10.9 (*)    All other components within normal limits  TROPONIN I (HIGH SENSITIVITY) - Abnormal; Notable for the following components:   Troponin I (High Sensitivity) 36 (*)    All other components within normal limits  RESP PANEL BY RT-PCR (FLU A&B, COVID) ARPGX2  SAMPLE TO BLOOD BANK    EKG EKG Interpretation  Date/Time:  Saturday Feb 16 2021 21:23:18 EDT Ventricular Rate:  100 PR Interval:  195 QRS Duration: 102 QT Interval:  354 QTC Calculation: 457 R Axis:   74 Text Interpretation: Sinus tachycardia Probable left ventricular hypertrophy Nonspecific T abnormalities, lateral leads No significant change since  last tracing Confirmed by Theotis Burrow [phone]) on 02/17/2021 12:19:05 AM   Radiology DG Chest Port 1 View  Result Date: 02/16/2021 CLINICAL DATA:  Respiratory distress for 1 day EXAM: PORTABLE CHEST 1 VIEW COMPARISON:  01/29/2021 FINDINGS: 2 frontal views of the chest demonstrate a stable cardiac silhouette. Stable emphysema, with no acute airspace disease, effusion, or pneumothorax. No acute bony abnormality. IMPRESSION: 1. Stable emphysema.  No acute process. Electronically Signed   By: Randa Ngo M.D.   On: 02/16/2021 21:49    Procedures .Critical Care Performed by: Sharlett Iles, MD Authorized by: Sharlett Iles, MD   Critical care provider statement:    Critical care time (minutes):  30   Critical care time was exclusive of:  Separately billable procedures and treating other patients   Critical care was necessary to treat or prevent imminent or life-threatening deterioration of the following conditions:  Respiratory failure   Critical care was time spent personally by me on the following activities:  Development of treatment plan with patient or surrogate, evaluation of patient's response to treatment, examination of patient, obtaining history from patient or surrogate, ordering  and performing treatments and interventions, ordering and review of laboratory studies, ordering and review of radiographic studies, re-evaluation of patient's condition and review of old charts     Medications Ordered in ED Medications  albuterol (PROVENTIL,VENTOLIN) solution continuous neb (10 mg/hr Nebulization New Bag/Given 02/17/21 0003)  methylPREDNISolone sodium succinate (SOLU-MEDROL) 125 mg/2 mL injection 125 mg (125 mg Intravenous Given 02/16/21 2136)    ED Course  I have reviewed the triage vital signs and the nursing notes.  Pertinent labs & imaging results that were available during my care of the patient were reviewed by me and considered in my medical decision making (see  chart for details).    MDM Rules/Calculators/A&P                          Pt in distress on arrival, prolonged expiratory phase and almost absent breath sounds.  Differential includes CHF versus COPD exacerbation.  Placed on BiPAP and gave continuous albuterol as well as Solu-Medrol.  He is compliant with medications including Eliquis therefore I feel PE is unlikely.  Lab work shows venous pH 7.38 with PCO2 of 60, COVID-negative, creatinine 1.72 which is slightly elevated from baseline, BNP 510.  Hemoglobin 9.9 similar to previous, normal WBC count.  Chest x-ray is clear.  On reassessment, he remains on BiPAP but is resting comfortably and states that his breathing has improved.  His work of breathing is much better than arrival.  He continues to have severely diminished breath sounds therefore I have ordered another hour of continuous albuterol.  Discussed admission with [facility] hospitalist, Dr. Tonie Griffith.  Final Clinical Impression(s) / ED Diagnoses Final diagnoses:  Acute respiratory failure with hypoxia ([place])  COPD exacerbation ([place])    Rx / DC Orders ED Discharge Orders    None       Rether Rison, Wenda Overland, MD 02/17/21 0022

## 2021-02-17 NOTE — Progress Notes (Signed)
RT NOTE:  Pt titrated from BIPAP to 4L [place]. Tolerating well. WOB normal, no distress. HR 94, RR 18, SpO2 100%.

## 2021-02-17 NOTE — Progress Notes (Signed)
*  PRELIMINARY RESULTS* Echocardiogram 2D Echocardiogram has been performed.  Stacey Drain 02/17/2021, 6:26 PM

## 2021-02-17 NOTE — Hospital Course (Addendum)
     1. Left ventricular ejection fraction, by estimation, is 55 to 60%. The  left ventricle has normal function. The left ventricle has no regional  wall motion abnormalities. There is mild concentric left ventricular  hypertrophy. Left ventricular diastolic  parameters are indeterminate.   2. Right ventricular systolic function is normal. The right ventricular  size is normal.   3. Left atrial size was mildly dilated.   4. The mitral valve is normal in structure. No evidence of mitral valve  regurgitation. No evidence of mitral stenosis.   5. The aortic valve was not well visualized. There is mild calcification  of the aortic valve. Aortic valve regurgitation is mild. Mild aortic valve  stenosis.   6. The inferior vena cava is dilated in size with >50% respiratory  variability, suggesting right atrial pressure of 8 mmHg.

## 2021-02-17 NOTE — Progress Notes (Signed)
RT assessed pts need for BP at this time. Pt respiratory status is stable w/no distress noted at this time. RT will continue to monitor.

## 2021-02-18 DIAGNOSIS — J441 Chronic obstructive pulmonary disease with (acute) exacerbation: Secondary | ICD-10-CM | POA: Diagnosis not present

## 2021-02-18 LAB — COMPREHENSIVE METABOLIC PANEL
ALT: 25 U/L (ref 0–44)
AST: 20 U/L (ref 15–41)
Albumin: 3 g/dL — ABNORMAL LOW (ref 3.5–5.0)
Alkaline Phosphatase: 50 U/L (ref 38–126)
Anion gap: 4 — ABNORMAL LOW (ref 5–15)
BUN: 18 mg/dL (ref 6–20)
CO2: 42 mmol/L — ABNORMAL HIGH (ref 22–32)
Calcium: 9 mg/dL (ref 8.9–10.3)
Chloride: 93 mmol/L — ABNORMAL LOW (ref 98–111)
Creatinine, Ser: 1.29 mg/dL — ABNORMAL HIGH (ref 0.61–1.24)
GFR, Estimated: >60 mL/min (ref >60)
Glucose, Bld: 226 mg/dL — ABNORMAL HIGH (ref 70–99)
Potassium: 3.4 mmol/L — ABNORMAL LOW (ref 3.5–5.1)
Sodium: 139 mmol/L (ref 135–145)
Total Bilirubin: 0.4 mg/dL (ref 0.3–1.2)
Total Protein: 5.4 g/dL — ABNORMAL LOW (ref 6.5–8.1)

## 2021-02-18 LAB — GLUCOSE, CAPILLARY
Glucose-Capillary: 142 mg/dL — ABNORMAL HIGH (ref 70–99)
Glucose-Capillary: 233 mg/dL — ABNORMAL HIGH (ref 70–99)
Glucose-Capillary: 219 mg/dL — ABNORMAL HIGH (ref 70–99)
Glucose-Capillary: 121 mg/dL — ABNORMAL HIGH (ref 70–99)

## 2021-02-18 LAB — MAGNESIUM: Magnesium: 2 mg/dL (ref 1.7–2.4)

## 2021-02-18 MED ORDER — INSULIN GLARGINE 100 UNIT/ML ~~LOC~~ SOLN
15.0000 [IU] | Freq: Every day | SUBCUTANEOUS | Status: DC
  Administered 2021-02-18: 15 [IU] via SUBCUTANEOUS
  Filled 2021-02-18 (×2): qty 0.15

## 2021-02-18 NOTE — Progress Notes (Signed)
  Pt is active with [facility]the home-based [facility] division of [facility].  On 02/11/21 a nurse from [facility] visited pt's home to share info about Hospice services.  At that time pt was no ready to consider Hospice care/total comfort care.  Plan will be to resume [facility] services upon d/c back home.  Please contact [facility] if we can be of assistance with d/c planning.  Thank you Julius Bowels, RN  Office [phone] Mobile [phone]

## 2021-02-18 NOTE — Progress Notes (Signed)
PROGRESS NOTE   Kenneth [name]  VEH:[id number] DOB: [birth year] DOA: 02/16/2021 PCP: [name], MD  Brief Narrative:  60 white male Known COPD Gold stage III-IV FEV1 25%, CRF 3 L O2 baseline--follows with Dr. Delton Coombes of pulmonology (still smoker exclamation)--- HFrEF-->HF improved EF 55-60% (not on Entresto-?  Angioedema?  AKI) P A. fib/Eliquis CAD >3 (not on aspirin as is on Eliquis) CKD 3 8 DM TY 2-poor control A1c 14.4 on 12/23/2020 HTN HLD OSA Bipolar  Recent admission 5 9--512 hypoxic respiratory failure Rx BiPAP- Follow-up visit Dr. Janne Napoleon 5/19-, office visit Dyane Dustman in outpatient setting he was started on chronic prednisone 10 mg  Presents [facility] ED 5/28 SOB x1 week RR 40-50-grunting diaphoretic-O2 sat 70% received albuterol 5 prior to admission On arrival placed on BiPAP in ED--this was eventually weaned to 4 L early morning 5/29  Hospital-Problem based course Decompensated mild heart failure-BNP 509 Less orthopnea-he is not sure of his baseline weight Continue Aldactone 25, Entresto resumed this admission-cut back Coreg to 6.25 Hydralazine 3 times daily amlodipine stopped Mild increase in troponin likely tachycardia mediated/mediated by heart failure No chest pain whatsoever EKG relatively benign-outpatient cardiology follow-up already arranged by Dr. Cristal Deer who is aware of his presence Possible decompensated COPD Continue DuoNeb, Daliresp, Incruse Ellipta/Breo Ellipta Solu-Medrol-->prednisone until 6/2 Paroxysmal A. fib CHADS2 score >3 not on aspirin/on apixaban Drop in beta-blocker dose given heart failure as above Continue apixaban DM TY 2 poor control A1c 14 recently Low CBG overnight-Lantus 10 only at this time Hold gabapentin 100 3 times daily for now OSA on CPAP Continue CPAP Suspected noncompliance on medical therapies Patient unaware of his baseline weight-also unaware of his fluid status and asking "if water counts towards his fluid  restriction goal" He will need significant education-nursing has been made aware   DVT prophylaxis: Apixaban Code Status: Full Family Communication: None Disposition:  Status is: Inpatient Likely can discharge once he is closer to about 200 pounds (currently 202) Remains inpatient appropriate because:Hemodynamically unstable and Inpatient level of care appropriate due to severity of illness   Dispo: The patient is from: Home              Anticipated d/c is to: Home              Patient currently is not medically stable to d/c.   Difficult to place patient Yes       Consultants:   None as yet  Procedures: No  Antimicrobials: Doxycycline since admission 5/28   Subjective:  Some shortness of breath but overall feels better Does not feel swollen Multiple variable questions about diet leading me to think that he is not compliant on fluid restriction and does not seem to understand heart failure restrictions  Objective: Vitals:   02/18/21 0515 02/18/21 0743 02/18/21 1056 02/18/21 1213  BP: (!) 155/90 (!) 141/74  128/81  Pulse: 93 86  92  Resp: 16 18  20   Temp: 98 F (36.7 C) 98.1 F (36.7 C)  97.8 F (36.6 C)  TempSrc: Oral Oral  Oral  SpO2: 100% 100%  100%  Weight:   92.2 kg   Height:        Intake/Output Summary (Last 24 hours) at 02/18/2021 1240 Last data filed at 02/18/2021 0620 Gross per 24 hour  Intake 942 ml  Output 1950 ml  Net -1008 ml   Filed Weights   02/17/21 0322 02/18/21 0102 02/18/21 1056  Weight: 95 kg 92.4 kg 92.2 kg  Examination:  Pleasant coherent no distress EOMI NCAT no focal deficit Cannot appreciate JVD Decreased air entry Trace lower extremity edema S1-S2 no murmur Sinus tachycardia on monitors Power 5/5   Data Reviewed: personally reviewed   CBC    Component Value Date/Time   WBC 10.6 (H) 02/17/2021 0532   RBC 3.54 (L) 02/17/2021 0532   HGB 9.3 (L) 02/17/2021 0532   HCT 32.7 (L) 02/17/2021 0532   PLT 207  02/17/2021 0532   MCV 92.4 02/17/2021 0532   MCH 26.3 02/17/2021 0532   MCHC 28.4 (L) 02/17/2021 0532   RDW 13.8 02/17/2021 0532   LYMPHSABS 2.0 02/16/2021 2132   MONOABS 0.8 02/16/2021 2132   EOSABS 0.1 02/16/2021 2132   BASOSABS 0.0 02/16/2021 2132   CMP Latest Ref Rng & Units 02/18/2021 02/16/2021 02/16/2021  Glucose 70 - 99 mg/dL 622(W) - 979(G)  BUN 6 - 20 mg/dL 18 - 11  Creatinine 9.21 - 1.24 mg/dL 1.94(R) - 7.40(C)  Sodium 135 - 145 mmol/L 139 136 138  Potassium 3.5 - 5.1 mmol/L 3.4(L) 3.8 3.9  Chloride 98 - 111 mmol/L 93(L) - 94(L)  CO2 22 - 32 mmol/L 42(H) - 33(H)  Calcium 8.9 - 10.3 mg/dL 9.0 - 8.6(L)  Total Protein 6.5 - 8.1 g/dL 1.4(G) - 6.1(L)  Total Bilirubin 0.3 - 1.2 mg/dL 0.4 - 0.4  Alkaline Phos 38 - 126 U/L 50 - 58  AST 15 - 41 U/L 20 - 31  ALT 0 - 44 U/L 25 - 32     Radiology Studies: DG Chest Port 1 View  Result Date: 02/16/2021 CLINICAL DATA:  Respiratory distress for 1 day EXAM: PORTABLE CHEST 1 VIEW COMPARISON:  01/29/2021 FINDINGS: 2 frontal views of the chest demonstrate a stable cardiac silhouette. Stable emphysema, with no acute airspace disease, effusion, or pneumothorax. No acute bony abnormality. IMPRESSION: 1. Stable emphysema.  No acute process. Electronically Signed   By: Sharlet Salina M.D.   On: 02/16/2021 21:49   ECHOCARDIOGRAM COMPLETE  Result Date: 02/17/2021    ECHOCARDIOGRAM REPORT   Patient Name:   Kenneth [name] Date of Exam: 02/17/2021 Medical Rec #:  [id number]    Height:       72.0 in Accession #:    [id number]   Weight:       209.4 lb Date of Birth:  [birth year]   BSA:          2.173 m Patient Age:    60 years     BP:           132/73 mmHg Patient Gender: M            HR:           84 bpm. Exam Location:  Inpatient Procedure: 2D Echo, Cardiac Doppler and Color Doppler Indications:    Congestive Heart Failure l50.9  History:        Patient has prior history of Echocardiogram examinations, most                 recent 10/04/2020. CHF, COPD,  Arrythmias:Atrial Fibrillation;                 Risk Factors:Diabetes, Hypertension, Dyslipidemia and Current                 Smoker. Obstructive sleep apnea, Alcohol abuse, Anticoagulant                 long-term use, Chronic kidney disease, stage 3a.  Sonographer:  Celesta Gentile RCS Referring Phys: [id number] BRADLEY S CHOTINER IMPRESSIONS  1. Left ventricular ejection fraction, by estimation, is 55 to 60%. The left ventricle has normal function. The left ventricle has no regional wall motion abnormalities. There is mild concentric left ventricular hypertrophy. Left ventricular diastolic parameters are indeterminate.  2. Right ventricular systolic function is normal. The right ventricular size is normal.  3. Left atrial size was mildly dilated.  4. The mitral valve is normal in structure. No evidence of mitral valve regurgitation. No evidence of mitral stenosis.  5. The aortic valve was not well visualized. There is mild calcification of the aortic valve. Aortic valve regurgitation is mild. Mild aortic valve stenosis.  6. The inferior vena cava is dilated in size with >50% respiratory variability, suggesting right atrial pressure of 8 mmHg. Comparison(s): No significant change from prior study. FINDINGS  Left Ventricle: Left ventricular ejection fraction, by estimation, is 55 to 60%. The left ventricle has normal function. The left ventricle has no regional wall motion abnormalities. The left ventricular internal cavity size was normal in size. There is  mild concentric left ventricular hypertrophy. Left ventricular diastolic parameters are indeterminate. Right Ventricle: The right ventricular size is normal. Right vetricular wall thickness was not well visualized. Right ventricular systolic function is normal. Left Atrium: Left atrial size was mildly dilated. Right Atrium: Right atrial size was normal in size. Pericardium: There is no evidence of pericardial effusion. Mitral Valve: The mitral valve is normal in  structure. No evidence of mitral valve regurgitation. No evidence of mitral valve stenosis. Tricuspid Valve: The tricuspid valve is normal in structure. Tricuspid valve regurgitation is trivial. Aortic Valve: The aortic valve was not well visualized. There is mild calcification of the aortic valve. Aortic valve regurgitation is mild. Aortic regurgitation PHT measures 418 msec. Mild aortic stenosis is present. Aortic valve mean gradient measures 12.0 mmHg. Aortic valve peak gradient measures 24.4 mmHg. Aortic valve area, by VTI measures 1.28 cm. Pulmonic Valve: The pulmonic valve was not well visualized. Pulmonic valve regurgitation is not visualized. Aorta: The aortic root and ascending aorta are structurally normal, with no evidence of dilitation. Venous: The inferior vena cava is dilated in size with greater than 50% respiratory variability, suggesting right atrial pressure of 8 mmHg. IAS/Shunts: The atrial septum is grossly normal.  LEFT VENTRICLE PLAX 2D LVIDd:         4.84 cm  Diastology LVIDs:         3.55 cm  LV e' medial:    6.42 cm/s LV PW:         1.00 cm  LV E/e' medial:  12.5 LV IVS:        1.20 cm  LV e' lateral:   5.55 cm/s LVOT diam:     2.10 cm  LV E/e' lateral: 14.5 LV SV:         68 LV SV Index:   31 LVOT Area:     3.46 cm  RIGHT VENTRICLE RV S prime:     14.00 cm/s TAPSE (M-mode): 2.3 cm LEFT ATRIUM             Index       RIGHT ATRIUM           Index LA diam:        3.60 cm 1.66 cm/m  RA Area:     15.30 cm LA Vol (A2C):   60.9 ml 28.03 ml/m RA Volume:   35.20 ml  16.20 ml/m LA  Vol (A4C):   80.6 ml 37.10 ml/m LA Biplane Vol: 71.3 ml 32.82 ml/m  AORTIC VALVE AV Area (Vmax):    1.22 cm AV Area (Vmean):   1.40 cm AV Area (VTI):     1.28 cm AV Vmax:           247.00 cm/s AV Vmean:          156.000 cm/s AV VTI:            0.531 m AV Peak Grad:      24.4 mmHg AV Mean Grad:      12.0 mmHg LVOT Vmax:         87.10 cm/s LVOT Vmean:        63.200 cm/s LVOT VTI:          0.196 m LVOT/AV VTI ratio:  0.37 AI PHT:            418 msec  AORTA Ao Root diam: 3.30 cm MITRAL VALVE MV Area (PHT): 3.06 cm     SHUNTS MV Decel Time: 248 msec     Systemic VTI:  0.20 m MV E velocity: 80.20 cm/s   Systemic Diam: 2.10 cm MV A velocity: 103.00 cm/s MV E/A ratio:  0.78 Jodelle Red MD Electronically signed by Jodelle Red MD Signature Date/Time: 02/17/2021/6:44:18 PM    Final      Scheduled Meds: . apixaban  5 mg Oral BID  . ARIPiprazole  7.5 mg Oral Daily  . busPIRone  15 mg Oral BID  . carvedilol  6.25 mg Oral BID WC  . doxycycline  100 mg Oral Q12H  . FLUoxetine  60 mg Oral Daily  . fluticasone furoate-vilanterol  1 puff Inhalation Daily   And  . umeclidinium bromide  1 puff Inhalation Daily  . furosemide  40 mg Intravenous Q12H  . hydrALAZINE  25 mg Oral TID  . hydrocortisone  25 mg Rectal BID  . insulin aspart  0-5 Units Subcutaneous QHS  . insulin aspart  0-9 Units Subcutaneous TID WC  . insulin glargine  50 Units Subcutaneous QHS  . isosorbide mononitrate  30 mg Oral Daily  . pantoprazole  40 mg Oral Daily  . predniSONE  40 mg Oral QAC breakfast  . roflumilast  500 mcg Oral Daily  . rosuvastatin  20 mg Oral Daily  . sacubitril-valsartan  1 tablet Oral BID  . sodium chloride flush  3 mL Intravenous Q12H  . spironolactone  25 mg Oral Daily   Continuous Infusions: . sodium chloride    . albuterol 10 mg/hr (02/17/21 0003)     LOS: 1 day   Time spent: 37 minutes  Rhetta Mura, MD [facility] To contact the attending provider between 7A-7P or the covering provider during after hours 7P-7A, please log into the web site [email or web address] and access using universal [place] password for that web site. If you do not have the password, please call the hospital operator.  02/18/2021, 12:40 PM

## 2021-02-18 NOTE — Progress Notes (Signed)
Patient refused CPAP for tonight 

## 2021-02-18 NOTE — Progress Notes (Signed)
Patient CBG 88.  Requested juice. 50 units of lantus ordered for tonight.  Notified MD.  MD requested CBG recheck an hour after juice.  Recheck shows CBG 188. MD notified and gave verbal order to give 10 units lantus rather than the original 50 units.

## 2021-02-19 ENCOUNTER — Telehealth: Payer: Self-pay | Admitting: Family Medicine

## 2021-02-19 DIAGNOSIS — J449 Chronic obstructive pulmonary disease, unspecified: Secondary | ICD-10-CM | POA: Diagnosis not present

## 2021-02-19 DIAGNOSIS — J441 Chronic obstructive pulmonary disease with (acute) exacerbation: Secondary | ICD-10-CM | POA: Diagnosis not present

## 2021-02-19 DIAGNOSIS — E1122 Type 2 diabetes mellitus with diabetic chronic kidney disease: Secondary | ICD-10-CM | POA: Diagnosis not present

## 2021-02-19 DIAGNOSIS — I5042 Chronic combined systolic (congestive) and diastolic (congestive) heart failure: Secondary | ICD-10-CM | POA: Diagnosis not present

## 2021-02-19 LAB — GLUCOSE, CAPILLARY
Glucose-Capillary: 116 mg/dL — ABNORMAL HIGH (ref 70–99)
Glucose-Capillary: 114 mg/dL — ABNORMAL HIGH (ref 70–99)

## 2021-02-19 LAB — RENAL FUNCTION PANEL
Albumin: 3.2 g/dL — ABNORMAL LOW (ref 3.5–5.0)
Anion gap: 7 (ref 5–15)
BUN: 14 mg/dL (ref 6–20)
CO2: 38 mmol/L — ABNORMAL HIGH (ref 22–32)
Calcium: 9.1 mg/dL (ref 8.9–10.3)
Chloride: 93 mmol/L — ABNORMAL LOW (ref 98–111)
Creatinine, Ser: 1.22 mg/dL (ref 0.61–1.24)
GFR, Estimated: >60 mL/min (ref >60)
Glucose, Bld: 210 mg/dL — ABNORMAL HIGH (ref 70–99)
Phosphorus: 3.2 mg/dL (ref 2.5–4.6)
Potassium: 3.7 mmol/L (ref 3.5–5.1)
Sodium: 138 mmol/L (ref 135–145)

## 2021-02-19 LAB — MAGNESIUM: Magnesium: 1.9 mg/dL (ref 1.7–2.4)

## 2021-02-19 MED ORDER — INSULIN GLARGINE 100 UNIT/ML ~~LOC~~ SOLN: 15.0000 [IU] | Freq: Every day | SUBCUTANEOUS | 11 refills | Status: DC

## 2021-02-19 MED ORDER — CARVEDILOL 6.25 MG PO TABS
6.2500 mg | ORAL_TABLET | Freq: Two times a day (BID) | ORAL | Status: DC
Start: 2021-02-19 — End: 2021-04-06

## 2021-02-19 MED ORDER — HYDROCORTISONE ACETATE 25 MG RE SUPP: 25.0000 mg | Freq: Two times a day (BID) | RECTAL | 0 refills | Status: DC

## 2021-02-19 MED ORDER — SACUBITRIL-VALSARTAN 24-26 MG PO TABS: 1.0000 | ORAL_TABLET | Freq: Two times a day (BID) | ORAL | 1 refills | Status: AC

## 2021-02-19 NOTE — TOC Transition Note (Signed)
Transition of Care [facility]) - CM/SW Discharge Note   Patient Details  Name: Onnie Hatchel MRN: [id number] Date of Birth: Sep 11, 1961  [facility]) CM/SW Contact:  Epifanio Lesches, RN Phone Number: 02/19/2021, 1:17 PM   Clinical Narrative:    Late entry:  Patient will DC to: home  Anticipated DC date: 02/19/2021 Family notified: yes, Delore(sgo), [phone] Transport by: car Admitted with SOB, hx of  COPD and asthma, chronic respiratory failure on 3 L of oxygen by nasal cannula, CHF paroxysmal atrial fibrillation / Eliquis, CKD stage IIIa, diabetes mellitus type 2 insulin-dependent, hypertension, hyperlipidemia, OSA on CPAP. Per MD patient ready for DC today. RN, patient, patient's family, and facility notified of DC. 30 DAY FREE card given and explained to pt along with patient assist form if needed. Benefits check place and noted for Entresto. Pt states active with [facility], community resource. Pt states no problems affording Rx meds in the past, no current concerns. Pt with post hospital f/u appointments noted on AVS. States reached CHF teaching and understands. Pt with scale @ home.  Portable tank oxygen tank @ bedside for transport to home.  RNCM will sign off for now as intervention is no longer needed. Please consult [place] again if new needs arise.    Final next level of care: Home/Self Care Barriers to Discharge: No Barriers Identified   Patient Goals and CMS Choice        Discharge Placement                       Discharge Plan and Services                                     Social Determinants of Health (SDOH) Interventions     Readmission Risk Interventions No flowsheet data found.

## 2021-02-19 NOTE — Plan of Care (Signed)

## 2021-02-19 NOTE — TOC Benefit Eligibility Note (Signed)
Transition of Care [facility]) Benefit Eligibility Note    Patient Details  Name: Waymon Laser MRN: [id number] Date of Birth: 06/04/1961   Medication/Dose: Sherryll Burger 24-26 MG BID  Covered?: Yes  Tier: 3 Drug  Prescription Coverage Preferred Pharmacy: [facility] with Person/Company/Phone Number:: ERIC  D.   @ [employer] # [phone]  Co-Pay: Alvester Morin  Prior Approval: No  Deductible: Unmet       Mardene Sayer Phone Number: 02/19/2021, 12:29 PM

## 2021-02-19 NOTE — Consult Note (Signed)
   [facility] Inpatient Consult   02/19/2021  Nygel Prokop 1961/03/19 [id number]   [facility] [[facility]]  Accountable Care Organization [ACO] Patient: [name]  Patient was screened for [facility] [[facility]]  Care Management services. Patient is listed to have the transition of care call conducted by the primary care provider's office. This patient is active an Education officer, museum for chronic care management with  Embedded Care Management team RNCM. Patient is noted to be active with [facility] home based palliative program.  Spoke with patient at the bedside and encouraged to keep follow up appointment with his Embedded RN CCM in regards to new medication and ongoing chronic issues including Hgb A1C of 14.4 noted 12/23/20.  Patient has an Producer, television/film/video noted as well.  Plan: Notification sent to the [facility] RN Embedded Care Management and made aware of above needs.   Please contact for further questions,  [name], RN BSN CCM [facility]  [phone] business mobile phone Toll free office [phone]  Fax number: [phone] [email or web address]

## 2021-02-19 NOTE — Telephone Encounter (Signed)
Drenda Freeze from [facility] of the [place] is wanting Dr. Veto Kemps to know Ibn is wanting to enroll in Hospice services and will he approve of this? Please advise Drenda Freeze at [phone].

## 2021-02-19 NOTE — Telephone Encounter (Signed)
Spoke to [facility], she states that he would like to get full hospice services and if provider would be willing to sign off on this.  Advise her to fax over the paperwork and he can fill it out once he is back in the office. Dm/cma

## 2021-02-19 NOTE — Discharge Summary (Addendum)
Physician Discharge Summary  Markes Shatswell ENI:[id number] DOB: 03/12/1961 DOA: 02/16/2021  PCP: [name], MD  Admit date: 02/16/2021 Discharge date: 02/19/2021  Time spent: 27 minutes  Recommendations for Outpatient Follow-up:  1. Will need Chem-12, CBC in 1 week 2. Recommend chest x-ray in 2 weeks 3. Recommend close outpatient follow-up with pulmonology to be set up by PCP 4. Probably does need TOC visit with Dr. Constance Haw who will be CCed on this  Discharge Diagnoses:  MAIN problem for hospitalization   multifactorial dyspnea secondary to acute heart failure >AE COPD  Chronic noncompliance on meds with poor understanding of heart failure-education provided this admission   Please see below for itemized issues addressed in [place]- refer to other progress notes for clarity if needed  Discharge Condition: Fair heart healthy 1200 cc low-salt   Filed Weights   02/18/21 0102 02/18/21 1056 02/19/21 0436  Weight: 92.4 kg 92.2 kg 91.1 kg    History of present illness:  60 white male Known COPD Gold stage III-IV FEV1 25%, CRF 3 L O2 baseline--follows with Dr. Lamonte Sakai of pulmonology (still smoker exclamation)--- HFrEF-->HF improved EF 55-60% (not on Entresto-?  Angioedema?  AKI) P A. fib/Eliquis CAD >3 (not on aspirin as is on Eliquis) CKD 3 8 DM TY 2-poor control A1c 14.4 on 12/23/2020 HTN HLD OSA Bipolar  Recent admission 5 9--512 hypoxic respiratory failure Rx BiPAP- Follow-up visit Dr. Al Pimple 5/19-, office visit Corky Downs in outpatient setting he was started on chronic prednisone 10 mg  Presents [facility] ED 5/28 SOB x1 week RR 40-50-grunting diaphoretic-O2 sat 70% received albuterol 5 prior to admission On arrival placed on BiPAP in ED--this was eventually weaned to 4 L early morning 5/29  Lee improved during hospitalization but has demonstrated poor compliance with diet does not seem to have a clear understanding and we will continue to diurese him and treat  his COPD concordantly and have home health RN come in and check on him  Hospital Course:  Decompensated mild heart failure-BNP 509 Less orthopnea-he is not sure of his baseline weight Continue Aldactone 25, Entresto resumed this admission-cut back Coreg to 6.25 this admission given acute heart failure Hydralazine stopped (it is doubtful he will take a 3 times daily medication), amlodipine stopped Discharging on Lasix 40 twice daily with instructions to take an extra if he gains more than 2 pounds in 24 hours Will need a weighing scale and para medicine to check in on him we will ask home health RN to go up Mild increase in troponin likely tachycardia mediated/mediated by heart failure No chest pain whatsoever EKG relatively benign-outpatient cardiology follow-up already arranged by Dr. Harrell Gave who is aware of his presence Possible decompensated COPD Continue DuoNeb, Daliresp, Incruse Ellipta/Breo Ellipta Solu-Medrol-->prednisone was given but then stopped on discharge as it was felt he probably had more heart failure than COPD Paroxysmal A. fib CHADS2 score >3 not on aspirin/on apixaban Drop in beta-blocker dose given heart failure as above Continue apixaban DM TY 2 poor control A1c 14 recently Low CBG this admission-resolved to 114-->210-Lantus 10 only at this time May need outpatient titration of blood sugars Hold gabapentin 100 3 times daily for now OSA on CPAP Continue CPAP Suspected noncompliance on medical therapies Patient unaware of his baseline weight-also unaware of his fluid status and asking "if water counts towards his fluid restriction goal" He will need significant education-nursing has been made aware We will ask home health to come out and ensure that things are  stable and that he understands the same   Discharge Exam: Vitals:   02/18/21 2053 02/19/21 0440  BP: 131/66 134/79  Pulse:  88  Resp:  20  Temp:  (!) 97.5 F (36.4 C)  SpO2:  100%    Subj on day  of d/c   Awake coherent alert no distress Ambulating some in the room No chest pain Eating and drinking Received some of the education from the nurse  General Exam on discharge  EOMI NCAT No JVD at 30 degrees Chest completely clear no rales Trace lower extremity edema Abdomen soft  Discharge Instructions   Discharge Instructions    Diet - low sodium heart healthy   Complete by: As directed    Discharge instructions   Complete by: As directed    Please ensure that you look at your medications carefully and take your Lasix as has been prescribed-I think that a lot of of your heart failure has to do with you eating too much salt and drinking too much fluid and we have given you education regarding this for you and your fianc-please look at your fluid intake carefully and follow the recommendations by the nurse who is discharging you Dr. Constance Haw is aware of your admission and will set up a close follow-up appointment-you will continue your oxygen-you should get labs at that visit to check your kidney function and you should also continue to work on exercise in addition to the diet as we have described above Good luck have a good summer   Increase activity slowly   Complete by: As directed      Allergies as of 02/19/2021      Reactions   Lisinopril Swelling   Other Other (See Comments)   Lettuce : rash   Tomato Rash      Medication List    STOP taking these medications   Coricidin HBP Day/Night Cold 10-20 &15-200-2 MG Misc Generic drug: DM-GG & DM-APAP-CPM   famotidine 20 MG tablet Commonly known as: PEPCID   gabapentin 100 MG capsule Commonly known as: NEURONTIN   hydrALAZINE 25 MG tablet Commonly known as: APRESOLINE   Lantus SoloStar 100 UNIT/ML Solostar Pen Generic drug: insulin glargine Replaced by: insulin glargine 100 UNIT/ML injection   One-A-Day Mens 50+ Tabs   OneTouch Ultra test strip Generic drug: glucose blood   predniSONE 10 MG tablet Commonly  known as: DELTASONE     TAKE these medications   acetaminophen 325 MG tablet Commonly known as: TYLENOL Take 650 mg by mouth every 6 (six) hours as needed for mild pain or headache.   albuterol (2.5 MG/3ML) 0.083% nebulizer solution Commonly known as: PROVENTIL Take 3 mLs (2.5 mg total) by nebulization every 6 (six) hours as needed for shortness of breath.   amLODipine 5 MG tablet Commonly known as: NORVASC Take 5 mg by mouth every morning.   ARIPiprazole 5 MG tablet Commonly known as: ABILIFY Take 7.5 mg by mouth daily.   busPIRone 15 MG tablet Commonly known as: BUSPAR Take 1 tablet (15 mg total) by mouth 2 (two) times daily.   carvedilol 6.25 MG tablet Commonly known as: COREG Take 1 tablet (6.25 mg total) by mouth 2 (two) times daily with a meal. What changed:   medication strength  how much to take   clotrimazole 10 MG troche Commonly known as: MYCELEX Take 1 tablet (10 mg total) by mouth 5 (five) times daily.   Daliresp 500 MCG Tabs tablet Generic drug: roflumilast Take 1  tablet (500 mcg total) by mouth daily.   doxycycline 100 MG tablet Commonly known as: VIBRA-TABS Take 1 tablet (100 mg total) by mouth every 12 (twelve) hours.   Eliquis 5 MG Tabs tablet Generic drug: apixaban TAKE ONE TABLET BY MOUTH EVERY MORNING and TAKE ONE TABLET BY MOUTH EVERY EVENING What changed: See the new instructions.   FLUoxetine 20 MG capsule Commonly known as: PROZAC Take 60 mg by mouth daily.   furosemide 40 MG tablet Commonly known as: LASIX Take 1 tablet (40 mg total) by mouth 2 (two) times daily.   guaiFENesin 600 MG 12 hr tablet Commonly known as: MUCINEX Take 1 tablet (600 mg total) by mouth 2 (two) times daily.   hydrocortisone 25 MG suppository Commonly known as: ANUSOL-HC Place 1 suppository (25 mg total) rectally 2 (two) times daily.   insulin glargine 100 UNIT/ML injection Commonly known as: LANTUS Inject 0.15 mLs (15 Units total) into the skin at  bedtime. Replaces: Lantus SoloStar 100 UNIT/ML Solostar Pen   ipratropium-albuterol 0.5-2.5 (3) MG/3ML Soln Commonly known as: DUONEB Take 3 mLs by nebulization every 6 (six) hours as needed.   isosorbide mononitrate 30 MG 24 hr tablet Commonly known as: IMDUR Take 30 mg by mouth daily.   ketoconazole 2 % cream Commonly known as: NIZORAL Apply 1 application topically daily.   lubiprostone 8 MCG capsule Commonly known as: [name] Take 1 capsule (8 mcg total) by mouth 2 (two) times daily with a meal.   nitroGLYCERIN 0.4 MG SL tablet Commonly known as: NITROSTAT DISSOLVE 1 TABLET UNDER THE TONGUE EVERY 5 MINUTES AS&nbsp;&nbsp;NEEDED FOR CHEST PAIN. MAX&nbsp;&nbsp;OF 3 TABLETS IN 15 MINUTES. CALL 911 IF PAIN PERSISTS. What changed:   how much to take  how to take this  when to take this  reasons to take this  additional instructions   ONE TOUCH ULTRA 2 w/Device Kit Use to test blood sugars 1-2 times daily.   onetouch ultrasoft lancets USE TO TEST BLOOD SUGAR 1-2 TIMES DAILY   pantoprazole 40 MG tablet Commonly known as: PROTONIX TAKE ONE TABLET BY MOUTH EVERY MORNING What changed: when to take this   Pen Needles 31G X 6 MM Misc 1 application by Does not apply route at bedtime.   pseudoephedrine 60 MG tablet Commonly known as: SUDAFED Take 60 mg by mouth every 4 (four) hours as needed for congestion.   rosuvastatin 20 MG tablet Commonly known as: CRESTOR TAKE ONE TABLET BY MOUTH ONCE DAILY   sacubitril-valsartan 24-26 MG Commonly known as: ENTRESTO Take 1 tablet by mouth 2 (two) times daily.   spironolactone 25 MG tablet Commonly known as: ALDACTONE Take 25 mg by mouth daily.   Trelegy Ellipta 100-62.5-25 MCG/INH Aepb Generic drug: Fluticasone-Umeclidin-Vilant Inhale 1 puff into the lungs daily.      Allergies  Allergen Reactions  . Lisinopril Swelling  . Other Other (See Comments)    Lettuce : rash  . Tomato Rash      The results of  significant diagnostics from this hospitalization (including imaging, microbiology, ancillary and laboratory) are listed below for reference.    Significant Diagnostic Studies: DG Chest Port 1 View  Result Date: 02/16/2021 CLINICAL DATA:  Respiratory distress for 1 day EXAM: PORTABLE CHEST 1 VIEW COMPARISON:  01/29/2021 FINDINGS: 2 frontal views of the chest demonstrate a stable cardiac silhouette. Stable emphysema, with no acute airspace disease, effusion, or pneumothorax. No acute bony abnormality. IMPRESSION: 1. Stable emphysema.  No acute process. Electronically Signed   By:  Randa Ngo M.D.   On: 02/16/2021 21:49   DG Chest Portable 1 View  Result Date: 01/29/2021 CLINICAL DATA:  60 year old male with shortness of breath EXAM: PORTABLE CHEST 1 VIEW COMPARISON:  Chest radiograph dated 01/22/2021. FINDINGS: No focal consolidation, pleural effusion or pneumothorax. Right mid to lower lung field linear atelectasis/scarring. The cardiac silhouette is within limits. Atherosclerotic calcification of the aorta. No acute osseous pathology. IMPRESSION: No active disease. Electronically Signed   By: Anner Crete M.D.   On: 01/29/2021 00:32   DG Chest Port 1 View  Result Date: 01/22/2021 CLINICAL DATA:  Dyspnea EXAM: PORTABLE CHEST 1 VIEW COMPARISON:  12/23/2020, CT 10/03/2020 FINDINGS: Lungs are clear. No pneumothorax or pleural effusion. Multiple nodules noted over the right apex represent callus related to multiple anterior right rib fractures noted on prior CT examination of 10/03/2020. Cardiac size is within normal limits. Pulmonary vascularity is normal. No acute bone abnormality. IMPRESSION: No active disease. Electronically Signed   By: Fidela Salisbury MD   On: 01/22/2021 18:04   ECHOCARDIOGRAM COMPLETE  Result Date: 02/17/2021    ECHOCARDIOGRAM REPORT   Patient Name:   Kenneth [name] Date of Exam: 02/17/2021 Medical Rec #:  [id number]    Height:       72.0 in Accession #:    [id number]   Weight:        209.4 lb Date of Birth:  [birth year]   BSA:          2.173 m Patient Age:    60 years     BP:           132/73 mmHg Patient Gender: M            HR:           84 bpm. Exam Location:  Inpatient Procedure: 2D Echo, Cardiac Doppler and Color Doppler Indications:    Congestive Heart Failure l50.9  History:        Patient has prior history of Echocardiogram examinations, most                 recent 10/04/2020. CHF, COPD, Arrythmias:Atrial Fibrillation;                 Risk Factors:Diabetes, Hypertension, Dyslipidemia and Current                 Smoker. Obstructive sleep apnea, Alcohol abuse, Anticoagulant                 long-term use, Chronic kidney disease, stage 3a.  Sonographer:    Alvino Chapel RCS Referring Phys: [id number] [place]  1. Left ventricular ejection fraction, by estimation, is 55 to 60%. The left ventricle has normal function. The left ventricle has no regional wall motion abnormalities. There is mild concentric left ventricular hypertrophy. Left ventricular diastolic parameters are indeterminate.  2. Right ventricular systolic function is normal. The right ventricular size is normal.  3. Left atrial size was mildly dilated.  4. The mitral valve is normal in structure. No evidence of mitral valve regurgitation. No evidence of mitral stenosis.  5. The aortic valve was not well visualized. There is mild calcification of the aortic valve. Aortic valve regurgitation is mild. Mild aortic valve stenosis.  6. The inferior vena cava is dilated in size with >50% respiratory variability, suggesting right atrial pressure of 8 mmHg. Comparison(s): No significant change from prior study. FINDINGS  Left Ventricle: Left ventricular ejection fraction, by estimation, is 55 to 60%.  The left ventricle has normal function. The left ventricle has no regional wall motion abnormalities. The left ventricular internal cavity size was normal in size. There is  mild concentric left ventricular hypertrophy.  Left ventricular diastolic parameters are indeterminate. Right Ventricle: The right ventricular size is normal. Right vetricular wall thickness was not well visualized. Right ventricular systolic function is normal. Left Atrium: Left atrial size was mildly dilated. Right Atrium: Right atrial size was normal in size. Pericardium: There is no evidence of pericardial effusion. Mitral Valve: The mitral valve is normal in structure. No evidence of mitral valve regurgitation. No evidence of mitral valve stenosis. Tricuspid Valve: The tricuspid valve is normal in structure. Tricuspid valve regurgitation is trivial. Aortic Valve: The aortic valve was not well visualized. There is mild calcification of the aortic valve. Aortic valve regurgitation is mild. Aortic regurgitation PHT measures 418 msec. Mild aortic stenosis is present. Aortic valve mean gradient measures 12.0 mmHg. Aortic valve peak gradient measures 24.4 mmHg. Aortic valve area, by VTI measures 1.28 cm. Pulmonic Valve: The pulmonic valve was not well visualized. Pulmonic valve regurgitation is not visualized. Aorta: The aortic root and ascending aorta are structurally normal, with no evidence of dilitation. Venous: The inferior vena cava is dilated in size with greater than 50% respiratory variability, suggesting right atrial pressure of 8 mmHg. IAS/Shunts: The atrial septum is grossly normal.  LEFT VENTRICLE PLAX 2D LVIDd:         4.84 cm  Diastology LVIDs:         3.55 cm  LV e' medial:    6.42 cm/s LV PW:         1.00 cm  LV E/e' medial:  12.5 LV IVS:        1.20 cm  LV e' lateral:   5.55 cm/s LVOT diam:     2.10 cm  LV E/e' lateral: 14.5 LV SV:         68 LV SV Index:   31 LVOT Area:     3.46 cm  RIGHT VENTRICLE RV S prime:     14.00 cm/s TAPSE (M-mode): 2.3 cm LEFT ATRIUM             Index       RIGHT ATRIUM           Index LA diam:        3.60 cm 1.66 cm/m  RA Area:     15.30 cm LA Vol (A2C):   60.9 ml 28.03 ml/m RA Volume:   35.20 ml  16.20 ml/m LA  Vol (A4C):   80.6 ml 37.10 ml/m LA Biplane Vol: 71.3 ml 32.82 ml/m  AORTIC VALVE AV Area (Vmax):    1.22 cm AV Area (Vmean):   1.40 cm AV Area (VTI):     1.28 cm AV Vmax:           247.00 cm/s AV Vmean:          156.000 cm/s AV VTI:            0.531 m AV Peak Grad:      24.4 mmHg AV Mean Grad:      12.0 mmHg LVOT Vmax:         87.10 cm/s LVOT Vmean:        63.200 cm/s LVOT VTI:          0.196 m LVOT/AV VTI ratio: 0.37 AI PHT:            418 msec  AORTA Ao Root diam: 3.30 cm MITRAL VALVE MV Area (PHT): 3.06 cm     SHUNTS MV Decel Time: 248 msec     Systemic VTI:  0.20 m MV E velocity: 80.20 cm/s   Systemic Diam: 2.10 cm MV A velocity: 103.00 cm/s MV E/A ratio:  0.78 Buford Dresser MD Electronically signed by Buford Dresser MD Signature Date/Time: 02/17/2021/6:44:18 PM    Final     Microbiology: Recent Results (from the past 240 hour(s))  Resp Panel by RT-PCR (Flu A&B, Covid) Nasopharyngeal Swab     Status: None   Collection Time: 02/16/21  9:33 PM   Specimen: Nasopharyngeal Swab; Nasopharyngeal(NP) swabs in vial transport medium  Result Value Ref Range Status   SARS Coronavirus 2 by RT PCR NEGATIVE NEGATIVE Final    Comment: (NOTE) SARS-CoV-2 target nucleic acids are NOT DETECTED.  The SARS-CoV-2 RNA is generally detectable in upper respiratory specimens during the acute phase of infection. The lowest concentration of SARS-CoV-2 viral copies this assay can detect is 138 copies/mL. A negative result does not preclude SARS-Cov-2 infection and should not be used as the sole basis for treatment or other patient management decisions. A negative result may occur with  improper specimen collection/handling, submission of specimen other than nasopharyngeal swab, presence of viral mutation(s) within the areas targeted by this assay, and inadequate number of viral copies(<138 copies/mL). A negative result must be combined with clinical observations, patient history, and  epidemiological information. The expected result is Negative.  Fact Sheet for Patients:  EntrepreneurPulse.com.au  Fact Sheet for Healthcare Providers:  IncredibleEmployment.be  This test is no t yet approved or cleared by the [place] FDA and  has been authorized for detection and/or diagnosis of SARS-CoV-2 by FDA under an Emergency Use Authorization (EUA). This EUA will remain  in effect (meaning this test can be used) for the duration of the COVID-19 declaration under Section 564(b)(1) of the Act, 21 U.S.C.section 360bbb-3(b)(1), unless the authorization is terminated  or revoked sooner.       Influenza A by PCR NEGATIVE NEGATIVE Final   Influenza B by PCR NEGATIVE NEGATIVE Final    Comment: (NOTE) The Xpert Xpress SARS-CoV-2/FLU/RSV plus assay is intended as an aid in the diagnosis of influenza from Nasopharyngeal swab specimens and should not be used as a sole basis for treatment. Nasal washings and aspirates are unacceptable for Xpert Xpress SARS-CoV-2/FLU/RSV testing.  Fact Sheet for Patients: EntrepreneurPulse.com.au  Fact Sheet for Healthcare Providers: IncredibleEmployment.be  This test is not yet approved or cleared by the [place] FDA and has been authorized for detection and/or diagnosis of SARS-CoV-2 by FDA under an Emergency Use Authorization (EUA). This EUA will remain in effect (meaning this test can be used) for the duration of the COVID-19 declaration under Section 564(b)(1) of the Act, 21 U.S.C. section 360bbb-3(b)(1), unless the authorization is terminated or revoked.  Performed at [facility], [place] [address] [place], [place] [address]      Labs: Basic Metabolic Panel: Recent Labs  Lab 02/16/21 2132 02/16/21 2147 02/18/21 0239 02/19/21 0400  NA 138 136 139 138  K 3.9 3.8 3.4* 3.7  CL 94*  --  93* 93*  CO2 33*  --  42* 38*  GLUCOSE 221*  --  226* 210*   BUN 11  --  18 14  CREATININE 1.72*  --  1.29* 1.22  CALCIUM 8.6*  --  9.0 9.1  MG  --   --  2.0 1.9  PHOS  --   --   --  3.2   Liver Function Tests: Recent Labs  Lab 02/16/21 2132 02/18/21 0239 02/19/21 0400  AST 31 20  --   ALT 32 25  --   ALKPHOS 58 50  --   BILITOT 0.4 0.4  --   PROT 6.1* 5.4*  --   ALBUMIN 3.6 3.0* 3.2*   No results for input(s): LIPASE, AMYLASE in the last 168 hours. No results for input(s): AMMONIA in the last 168 hours. CBC: Recent Labs  Lab 02/16/21 2132 02/16/21 2147 02/17/21 0532  WBC 8.3  --  10.6*  NEUTROABS 5.3  --   --   HGB 9.9* 10.9* 9.3*  HCT 34.8* 32.0* 32.7*  MCV 93.8  --  92.4  PLT 260  --  207   Cardiac Enzymes: No results for input(s): CKTOTAL, CKMB, CKMBINDEX, TROPONINI in the last 168 hours. BNP: BNP (last 3 results) Recent Labs    01/22/21 1739 01/29/21 0005 02/16/21 2132  BNP 744.3* 432.5* 509.8*    ProBNP (last 3 results) No results for input(s): PROBNP in the last 8760 hours.  CBG: Recent Labs  Lab 02/18/21 1211 02/18/21 1701 02/18/21 2047 02/19/21 0610 02/19/21 0730  GLUCAP 233* 219* 121* 116* 114*       Signed:  Nita Sells MD   [facility] 02/19/2021, 9:21 AM

## 2021-02-19 NOTE — Telephone Encounter (Signed)
lft VM to rtn call @ number provided. Dm/cma

## 2021-02-19 NOTE — Progress Notes (Signed)
D/C instructions given and reviewed. Tele and IV removed, tolerated well. Awaiting CM with entresto assistance form.

## 2021-02-19 NOTE — Progress Notes (Addendum)
Nutrition Education Note  RD consulted for nutrition education regarding CHF and diabetes.  Lab Results  Component Value Date   HGBA1C 14.4 (H) 12/23/2020    PTA DM medications are 50 units insulin glargine daily.   Labs reviewed: CBGS: 114-219 (inpatient orders for glycemic control are 0-5 units insulin aspart daily, 0-9 units insulin aspart TID with meals, and 15 units insulin glargine daily).   RD provided "Heart Healthy, Consistent Carbohydrate Nutrition Therapy" handout from the Academy of Nutrition and Dietetics. Attached to AVS/ discharge summary.   Case discussed with RN, pt is actively being discharge due to family emergency and is about to leave. She requests RD defer education. RD will also refer to [facility] for additional support as an outpatient.   Current diet order is heart healthy/ carb modified, patient is consuming approximately 100% of meals at this time. Labs and medications reviewed. No further nutrition interventions warranted at this time. RD contact information provided. If additional nutrition issues arise, please re-consult RD.   Levada Schilling, RD, LDN, CDCES Registered Dietitian II Certified Diabetes Care and Education Specialist Please refer to [facility] for RD and/or RD on-call/weekend/after hours pager

## 2021-02-19 NOTE — Progress Notes (Signed)
Concur with Nursing student assessment.

## 2021-02-19 NOTE — Discharge Instructions (Signed)

## 2021-02-20 ENCOUNTER — Telehealth: Payer: Self-pay

## 2021-02-20 NOTE — Telephone Encounter (Signed)
Called pt to schedule hospital follow up with Dr. Christopher. Left message to call back.  

## 2021-02-21 ENCOUNTER — Telehealth: Payer: Self-pay

## 2021-02-21 ENCOUNTER — Other Ambulatory Visit: Payer: Self-pay

## 2021-02-21 ENCOUNTER — Ambulatory Visit (INDEPENDENT_AMBULATORY_CARE_PROVIDER_SITE_OTHER): Payer: Medicare Other | Admitting: Emergency Medicine

## 2021-02-21 ENCOUNTER — Encounter: Payer: Self-pay | Admitting: Emergency Medicine

## 2021-02-21 DIAGNOSIS — I5042 Chronic combined systolic (congestive) and diastolic (congestive) heart failure: Secondary | ICD-10-CM

## 2021-02-21 DIAGNOSIS — G4733 Obstructive sleep apnea (adult) (pediatric): Secondary | ICD-10-CM

## 2021-02-21 DIAGNOSIS — J441 Chronic obstructive pulmonary disease with (acute) exacerbation: Secondary | ICD-10-CM

## 2021-02-21 DIAGNOSIS — J9611 Chronic respiratory failure with hypoxia: Secondary | ICD-10-CM | POA: Diagnosis not present

## 2021-02-21 DIAGNOSIS — E1169 Type 2 diabetes mellitus with other specified complication: Secondary | ICD-10-CM

## 2021-02-21 NOTE — Patient Instructions (Addendum)
Please continue Trelegy 1 inhalation once daily.  Rinse and gargle after using. Use your albuterol either 1 nebulizer treatment or 2 puffs up to every 4 hours if needed for shortness of breath, chest tightness, wheezing. Continue your Daliresp once daily as you have been taking it. Continue prednisone 10 mg once daily. Continue your oxygen at 3 to 4 L/min depending on your level of exertion. Wear your CPAP machine reliably when you are sleeping. Agree with referral to palliative care services, hospice services.  We will work with them going forward to manage care. Continue your cardiac medications and diuretics as directed Follow with Dr. Delton Coombes or APP in 3 to 4 weeks.

## 2021-02-21 NOTE — Progress Notes (Signed)
Chronic Care Management Pharmacy Assistant   Name: Kenneth [name]  MRN: [id number] DOB: 1960-12-24  Reason for Encounter: Medication Review /Medication Coordination Call.   Recent office visits:  02/08/2021 Dr.[name] MD (PCP)Change Gabapentin 100 mg PRN, Increase Daliresp to 500 MCG 1 tablet daily.  Recent consult visits:  02/21/2021 Dr.Byrum MmonologyD (Pulmonary)  02/07/2021 Dr.Christopher MD (Cardiology)   Hospital visits:  Medication Reconciliation was completed by comparing discharge summary, patient's EMR and Pharmacy list, and upon discussion with patient.  Admitted to the hospital on 02/16/2021 due to Acute respiratory failure . Discharge date was 02/19/2021. Discharged from [place]?Medications Started at [facility] Discharge:?? -started Lasix 40 twice daily with instructions to take an extra if he gains more than 2 pounds in 24 hours   Medication Changes at Hospital Discharge: -Changed Coreg to 6.25 2 times daily  -Changed Eliquis 5 mg TAKE ONE TABLET BY MOUTH EVERY MORNING and TAKE ONE TABLET BY MOUTH EVERY EVENING -Changed nitroglycerin 0.4 mg - DISSOLVE 1 TABLET UNDER THE TONGUE EVERY 5 MINUTES AS&nbsp;&nbsp;NEEDED FOR CHEST PAIN. MAX&nbsp;&nbsp;OF 3 TABLETS IN 15 MINUTES. CALL 911 IF PAIN PERSISTS -Change pantoprazole 40 mg - TAKE ONE TABLET BY MOUTH EVERY MORNING  Medications Discontinued at Hospital Discharge: -Stopped Hydralazine  - prednisone was given but then stopped on discharge as it was felt he probably had more heart failure than COPD per note -Hold gabapentin 100 mg -Stopped famotidine 20 MG tablet -Stopped One- A- Day MENS 50+ -Stopped OneTouch Ultra test strip - Stopped Coricidin HBP Day/Night Cold 10-20 &15-200-2 MG Misc  Medications that remain the same after Hospital Discharge:??  -All other medications will remain the same.    Medications: Outpatient Encounter Medications as of 02/21/2021  Medication Sig Note  .  acetaminophen (TYLENOL) 325 MG tablet Take 650 mg by mouth every 6 (six) hours as needed for mild pain or headache.   . albuterol (PROVENTIL) (2.5 MG/3ML) 0.083% nebulizer solution Take 3 mLs (2.5 mg total) by nebulization every 6 (six) hours as needed for shortness of breath. (Patient not taking: No sig reported)   . amLODipine (NORVASC) 5 MG tablet Take 5 mg by mouth every morning.   . ARIPiprazole (ABILIFY) 5 MG tablet Take 7.5 mg by mouth daily.   . Blood Glucose Monitoring Suppl (ONE TOUCH ULTRA 2) w/Device KIT Use to test blood sugars 1-2 times daily.   . busPIRone (BUSPAR) 15 MG tablet Take 1 tablet (15 mg total) by mouth 2 (two) times daily.   . carvedilol (COREG) 6.25 MG tablet Take 1 tablet (6.25 mg total) by mouth 2 (two) times daily with a meal.   . clotrimazole (MYCELEX) 10 MG troche Take 1 tablet (10 mg total) by mouth 5 (five) times daily.   [name] doxycycline (VIBRA-TABS) 100 MG tablet Take 1 tablet (100 mg total) by mouth every 12 (twelve) hours. (Patient not taking: No sig reported)   . ELIQUIS 5 MG TABS tablet TAKE ONE TABLET BY MOUTH EVERY MORNING and TAKE ONE TABLET BY MOUTH EVERY EVENING (Patient taking differently: Take 5 mg by mouth 2 (two) times daily.)   . FLUoxetine (PROZAC) 20 MG capsule Take 60 mg by mouth daily.   . Fluticasone-Umeclidin-Vilant (TRELEGY ELLIPTA) 100-62.5-25 MCG/INH AEPB Inhale 1 puff into the lungs daily.   . furosemide (LASIX) 40 MG tablet Take 1 tablet (40 mg total) by mouth 2 (two) times daily.   [name] guaiFENesin (MUCINEX) 600 MG 12 hr tablet Take 1 tablet (  600 mg total) by mouth 2 (two) times daily.   . hydrocortisone (ANUSOL-HC) 25 MG suppository Place 1 suppository (25 mg total) rectally 2 (two) times daily.   . insulin glargine (LANTUS) 100 UNIT/ML injection Inject 0.15 mLs (15 Units total) into the skin at bedtime.   . Insulin Pen Needle (PEN NEEDLES) 31G X 6 MM MISC 1 application by Does not apply route at bedtime.   [name] ipratropium-albuterol (DUONEB)  0.5-2.5 (3) MG/3ML SOLN Take 3 mLs by nebulization every 6 (six) hours as needed.   . isosorbide mononitrate (IMDUR) 30 MG 24 hr tablet Take 30 mg by mouth daily.   [name] ketoconazole (NIZORAL) 2 % cream Apply 1 application topically daily.   . Lancets (ONETOUCH ULTRASOFT) lancets USE TO TEST BLOOD SUGAR 1-2 TIMES DAILY   . lubiprostone ([name]) 8 MCG capsule Take 1 capsule (8 mcg total) by mouth 2 (two) times daily with a meal.   . nitroGLYCERIN (NITROSTAT) 0.4 MG SL tablet DISSOLVE 1 TABLET UNDER THE TONGUE EVERY 5 MINUTES AS&nbsp;&nbsp;NEEDED FOR CHEST PAIN. MAX&nbsp;&nbsp;OF 3 TABLETS IN 15 MINUTES. CALL 911 IF PAIN PERSISTS. (Patient taking differently: Place 0.4 mg under the tongue every 5 (five) minutes as needed for chest pain.) 10/04/2020: Last filled 07/25/20  . pantoprazole (PROTONIX) 40 MG tablet TAKE ONE TABLET BY MOUTH EVERY MORNING (Patient taking differently: Take 40 mg by mouth daily.)   . pseudoephedrine (SUDAFED) 60 MG tablet Take 60 mg by mouth every 4 (four) hours as needed for congestion.   . roflumilast (DALIRESP) 500 MCG TABS tablet Take 1 tablet (500 mcg total) by mouth daily.   . rosuvastatin (CRESTOR) 20 MG tablet TAKE ONE TABLET BY MOUTH ONCE DAILY (Patient taking differently: Take 20 mg by mouth daily.)   . sacubitril-valsartan (ENTRESTO) 24-26 MG Take 1 tablet by mouth 2 (two) times daily.   [name] spironolactone (ALDACTONE) 25 MG tablet Take 25 mg by mouth daily.    No facility-administered encounter medications on file as of 02/21/2021.   Star Rating Drugs: Rosuvastatin 20 mg last filled on 01/25/2021 [facility]. Entresto 24-26 mg last filled on 02/19/2021 for 30 day supply at [facility].  Reviewed chart for medication changes ahead of medication coordination call.   BP Readings from Last 3 Encounters:  02/21/21 136/70  02/19/21 134/79  02/08/21 132/70    Lab Results  Component Value Date   HGBA1C 14.4 (H) 12/23/2020     Patient  obtains medications through Vials  30 Days   Last adherence delivery included:   Amlodipine 5MG Tablet Daily -Breakfast  Aripiprazole 7 mg daily   Buspirone 15 MGoneTablet Two Times Daily - Breakfast, Evening meals  Eliquis 5 MGOneTablets twice daily -Breakfast, Evening Meals  Fluoxetine 20 MG Capsule Three Capsules Daily  Furosemide 40 MgOneTablet Twice Daily - Breakfast, Evening Meals   Pantoprazole 40 MGOneTablet Daily -Breakfast  Rosuvastatin 20 mg daily  Albuterol 108 MCG/ACT inhalerPRN   Lubiprostone 8 MCGOneCapsule Twice Daily- Breakfast, Evening Meals  Hydralazine 25 mgTake one tablet 3 times daily  insulin glargine (LANTUS SOLOSTAR) 100 UNIT/ML Solostar- inject 55 Units into the skin at bedtime. If fasting blood sugars remain >150 for 1 week, increase insulin dose by 5 units. Maximum of 80 units  One touch Ult 100CT Test strip  Carvedilol 12.5 mg 1 tablet 2 times daily  Isosorbide mononitrate 30 mg 1 tablet daily.  Pen needles 31g   Patient declined medication last month:  Metformin HCI 500 MG Tablet  Twice Daily-(patient states he not taking per instruction from provider)  Gabapentin 100 mg capsule-take 2 capsule by mouth 3 times daily(adequate supply)  Albuterol 0.83% 25CT SOLN PRN- Patient states he will get this from a company.  Spironolactone place on hold until follow up with Cardiology  Potassium place on hold until follow with Cardiology.  Nitroglycerin 0.4MG SL Tablet PRN-Adequate Supply  Patient is due for next adherence delivery on: 02/28/2021. Called patient and reviewed medications and coordinated delivery.  This delivery to include:  Amlodipine 5MG Tablet Daily -Breakfast  Aripiprazole 7 mg daily   Buspirone 15 MGoneTablet Two Times Daily - Breakfast, Evening meals  Eliquis 5 MGOneTablets twice daily -Breakfast, Evening Meals  Fluoxetine 20 MG Capsule Three Capsules Daily  Furosemide 40 MgOneTablet  Twice Daily - Breakfast, Evening Meals   Pantoprazole 40 MGOneTablet Daily -Breakfast  Rosuvastatin 20 mg daily  Lubiprostone 8 MCGOneCapsule Twice Daily- Breakfast, Evening Meals  insulin glargine (LANTUS SOLOSTAR) 100 UNIT/ML Solostar- inject 50 Units into the skin at bedtime. If fasting blood sugars remain >150 for 1 week, increase insulin dose by 5 units. Maximum of 80 units  One touch Ult 100CT Test strip  Carvedilol 6.25 mg 1 tablet 2 times daily  Isosorbide mononitrate 30 mg 1 tablet daily.  Pen needles 31g  Spironolactone 25 mg 1 tablet daily   Patient declined the following medications:  Metformin HCI 500 MG Tablet Twice Daily-(patient states he not taking per instruction from provider)  Gabapentin 100 mg capsule-take 2 capsule by mouth 3 times daily(adequate supply)  Albuterol 0.83% 25CT SOLN PRN- Patient states he will get this from a company.  Nitroglycerin 0.4MG SL Tablet PRN-Adequate Supply  Hydralazine 25 mgTake one tablet 3 times daily (Not taking, ED stop Medication)  Famotidine 20 mg - (Not taking , ED stop medication)  Daliresp 500 MCG 1 tablet daily (Adequate supply, last filled on 02/11/2021 for 30 day supply)  Trelegy 100-62.5-25 MCG 1 puff daily (Adequate supply,last filled on 02/11/2021 for 30 day supply)  Entresto 24-26 mg 1 tablet two times aily  Patient needs refills for None ID.  Confirmed delivery date of 02/28/2021, advised patient that pharmacy will contact them the morning of delivery.  [place] Pharmacist Assistant [phone]

## 2021-02-21 NOTE — Assessment & Plan Note (Signed)
Continue CPAP qhs. ? Whether he would benefit from a BiPAP titration, transition to BiPAP or  Trilogy vent

## 2021-02-21 NOTE — Progress Notes (Signed)
Subjective:    Patient ID: Kenneth [name], male    DOB: 07-Jun-1961, 60 y.o.   MRN: [id number]  HPI 60 year old smoker (50+ pack years) with a history of atrial fibrillation on anticoagulation, diabetes, hypertension, OSA on CPAP. He carries a history of asthma / COPD that was made many years ago. He is on Advair, uses albuterol several times a day, atrovent nebs qd.   He has been dealing with progressive dyspnea, often w exertion but can happen at rest as well. Happening over the last year. Lots of cough, usually dry. He hears wheeze most days. Has benefited from pred - frequent exacerbations.   He has good compliance with CPAP, an an auto-set 13-20cm H2O  ROV 05/02/20 --60 year old smoker with A. fib (anticoagulated), diabetes, hypertension, OSA on CPAP.  Seen in June for COPD/asthma.  Since I saw him he has been seen twice, admitted twice for cough and wheezing.  Treated with prednisone and antibiotics.  On the second hospitalization he was noted to have a significantly elevated BNP, chronic systolic CHF and was diuresed.  At our initial visit I stopped his Advair and start him on Trelegy to see if he would get more benefit.  He underwent pulmonary function testing today which I have reviewed, shows very severe obstruction without a bronchodilator response, restricted volumes (nitrogen washout) and a decreased diffusion capacity that does not fully correct for his alveolar volume. He feels that the Trelegy has helped some, still has exertional SOB and cough. When he was admitted they added O2 to his CPAP which he felt helped him. He has borrowed his partner's O2 but doesn't have it for his own device. He had desats to 89% w ambulation last time here. Uses albuterol 3-4x a day. He stopped smoking 2 weeks ago.    ROV 07/12/20 --60 year old gentleman with obstructive sleep apnea and severe COPD/asthma.  He has had multiple exacerbations of this as well as chronic systolic CHF.  His other medical history  significant for atrial fibrillation, diabetes, hypertension.  Currently managed on Trelegy. He has O2 for his CPAP / qhs. He is interested in possible POC.  He is compliant with his CPAP.  He reports that this morning when he woke up and took off his CPAP he went to get ready without his oxygen on and he developed acute dyspnea, exhaustion. He briefly had to get back on his CPAP. He did recover, but he called EMS for eval.  Saturations were 98 with CPAP on, dropped to 90% when he took the CPAP off. He had his flu shot 3 days ago, felt some chills, loss of appetite. No real change in sx leading up to the event this am.   ROV 02/21/21 --60 year old man with severe COPD/asthma, chronic bronchitic phenotype with multiple exacerbations.  He also has hypertension, atrial fibrillation, diabetes, chronic systolic CHF, OSA on CPAP.  Hypoxemic respiratory failure, currently on 3-4 L/min. Has been seen multiple times by TP since my last visit with him and he is also had multiple hospitalizations including an out of hospital PEA arrest.  He had a pseudomonal bronchitis/pneumonia on that hospitalization. Has been admitted twice in May 2022 with exacerbations of both heart failure and COPD.  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 He tells me that he would want MV if he had resp failure.  He remains on trelegy. Uses albuterol nebs 2-3x a day, HFA rarely.  He is on prednisone >> 10mg   daily. On daliresp.    Review of Systems As per HPI      Objective:   Physical Exam  Vitals:   02/21/21 1049  BP: 136/70  Pulse: 81  Temp: 98.7 F (37.1 C)  TempSrc: Temporal  SpO2: 97%  Weight: 205 lb 3.2 oz (93.1 kg)  Height: 6' (1.829 m)   Gen: Pleasant, well-nourished, in no distress,  normal affect  ENT: No lesions,  mouth clear,  oropharynx clear, no postnasal drip  Neck: No JVD, no stridor  Lungs: No use of accessory muscles, distant , no wheeze  Cardiovascular:  RRR, heart sounds normal, no murmur or gallops, no peripheral edema  Musculoskeletal: No deformities, no cyanosis or clubbing  Neuro: alert, awake, non focal  Skin: Warm, no lesions or rash     Assessment & Plan:  Chronic respiratory failure with hypoxia (HCC) Multifactorial chronic respiratory failure.  COPD and systolic heart failure are both driving this.  We are working to try and manage his COPD effectively as below.  He also has sleep disordered breathing which is a contributor.  Plan to continue his oxygen at 3-4 L/min getting a level of exertion.  I do agree with referral to palliative care, hospice services.  Based on the combination of problems that contribute to his respiratory failure I think he is end-stage.  I discussed CODE STATUS with him today and he indicated that he would still want mechanical ventilation if he developed acute respiratory failure.  I am not sure that this is in his best interest as it would be difficult for him to be liberated from MV.  We will have to continue to have discussions about this going forward.  Goals of care discussion 15 minutes of the total visit  Chronic obstructive pulmonary disease (HCC) Please continue Trelegy 1 inhalation once daily.  Rinse and gargle after using. Use your albuterol either 1 nebulizer treatment or 2 puffs up to every 4 hours if needed for shortness of breath, chest tightness, wheezing. Continue your Daliresp once daily as you have been taking it. Continue prednisone 10 mg once daily. Agree with referral to palliative care services, hospice services.  We will work with them going forward to manage care. Follow with Dr. Delton Coombes or APP in 3 to 4 weeks.  Obstructive sleep apnea Continue CPAP qhs. ? Whether he would benefit from a BiPAP titration, transition to BiPAP or  Trilogy vent  Levy Pupa, MD, PhD 02/21/2021, 11:30 AM [facility] [phone] or if no answer [phone]

## 2021-02-21 NOTE — Assessment & Plan Note (Signed)
Please continue Trelegy 1 inhalation once daily.  Rinse and gargle after using. Use your albuterol either 1 nebulizer treatment or 2 puffs up to every 4 hours if needed for shortness of breath, chest tightness, wheezing. Continue your Daliresp once daily as you have been taking it. Continue prednisone 10 mg once daily. Agree with referral to palliative care services, hospice services.  We will work with them going forward to manage care. Follow with Dr. Delton Coombes or APP in 3 to 4 weeks.

## 2021-02-21 NOTE — Assessment & Plan Note (Signed)
Multifactorial chronic respiratory failure.  COPD and systolic heart failure are both driving this.  We are working to try and manage his COPD effectively as below.  He also has sleep disordered breathing which is a contributor.  Plan to continue his oxygen at 3-4 L/min getting a level of exertion.  I do agree with referral to palliative care, hospice services.  Based on the combination of problems that contribute to his respiratory failure I think he is end-stage.  I discussed CODE STATUS with him today and he indicated that he would still want mechanical ventilation if he developed acute respiratory failure.  I am not sure that this is in his best interest as it would be difficult for him to be liberated from MV.  We will have to continue to have discussions about this going forward.  Goals of care discussion 15 minutes of the total visit

## 2021-02-21 NOTE — Telephone Encounter (Signed)
Transition Care Management Unsuccessful Follow-up Telephone Call  Date of discharge and from where:  02/19/21 from [facility]  Attempts:  1st Attempt  Reason for unsuccessful TCM follow-up call:  Left voice message Hospital Follow up scheduled 03/11/21. Will follow.

## 2021-02-25 MED ORDER — SPIRONOLACTONE 25 MG PO TABS: 25.0000 mg | ORAL_TABLET | Freq: Every day | ORAL | 3 refills | Status: AC

## 2021-02-25 MED ORDER — ROSUVASTATIN CALCIUM 20 MG PO TABS: 20.0000 mg | ORAL_TABLET | Freq: Every day | ORAL | 3 refills | Status: AC

## 2021-02-25 NOTE — Addendum Note (Signed)
Addended by: Julious Payer A on: 02/25/2021 12:58 PM   Modules accepted: Orders

## 2021-02-25 NOTE — Telephone Encounter (Signed)
Transition Care Management Follow-up Telephone Call  Date of discharge and from where:   How have you been since you were released from the hospital? Caregiver states patient is doing really well and at what appears to be baseline. Oxygen in use at 3L. Monitoring intake/output closely, within limit required. Denies new or worsening symptoms.  Any questions or concerns? No   Items Reviewed:  Did the pt receive and understand the discharge instructions provided? Yes   Medications obtained and verified? Yes   Other? Yes , oxygen 3L. Cpap.  Any new allergies since your discharge? No   Dietary orders reviewed? Yes, low salt  Do you have support at home? Yes    Home Care and Equipment/Supplies: Were home health services ordered? Yes  Functional Questionnaire: (I = Independent and D = Dependent) ADLs: assist as needed  Follow up appointments reviewed:   PCP Hospital f/u appt confirmed? Yes  Scheduled to see Dr. Veto Kemps on 03/01/21 @ 11:30. Pulmonology to be set up by pcp per discharge note.  Are transportation arrangements needed? No   If their condition worsens, is the pt aware to call PCP or go to the Emergency Dept.? Yes  Was the patient provided with contact information for the PCP's office or ED? Yes  Was to pt encouraged to call back with questions or concerns? Yes

## 2021-02-26 ENCOUNTER — Telehealth: Payer: Self-pay

## 2021-02-26 NOTE — Progress Notes (Signed)
Spoke with patient significant other to confirm if patient is needing temazepam. amlodipine, NTG sublingual, Senna, carvedilol, aripiprazole, dexamethasone, omeprazole, lorazepam and oxycodone that was prescribe by [facility] NP to be filled.  Per patient Significant other patient is asleep and she prefer me to speak to him regarding his medication.Patient Significant other states to either call later this afternoon or tomorrow morning.Notifed Clinical Pharmacist.  Spoke with patient to confirm if he is needing refills temazepam. amlodipine, NTG sublingual, Senna, carvedilol, aripiprazole, dexamethasone, omeprazole, lorazepam and oxycodone that was prescribe by [facility] NP to be filled.  Patient states he needs everything.Notified clinical pharmacist and [facility].  Everlean Cherry Clinical Pharmacist Assistant [phone]

## 2021-02-26 NOTE — Chronic Care Management (AMB) (Signed)
02/26/2021- Called patient to follow up on medication needs with [facility] and new prescriptions received from [facility] nurse practicioner, checking to see if patient would like delivered: Temazepam, Amlodipine, NTG sublingual, Senna, Carvedilol, Arpiprazole, Dexamethasone, Omeprazole, Lorazepam and Oxycodone.  Tried calling patient twice on home number and twice on cell number, phone does not connect, patient has called me back three times but not able to connect with patient on phone, can't hear patient talking. Message sent to CPP to try and connect with patient also.  Billee Cashing, CMA Clinical Pharmacist Assistant [phone] CCM time: 5 mins.

## 2021-02-27 ENCOUNTER — Ambulatory Visit (INDEPENDENT_AMBULATORY_CARE_PROVIDER_SITE_OTHER): Payer: Medicare Other

## 2021-02-27 DIAGNOSIS — J441 Chronic obstructive pulmonary disease with (acute) exacerbation: Secondary | ICD-10-CM | POA: Diagnosis not present

## 2021-02-27 DIAGNOSIS — E1140 Type 2 diabetes mellitus with diabetic neuropathy, unspecified: Secondary | ICD-10-CM | POA: Diagnosis not present

## 2021-02-27 DIAGNOSIS — I5042 Chronic combined systolic (congestive) and diastolic (congestive) heart failure: Secondary | ICD-10-CM

## 2021-02-27 DIAGNOSIS — Z794 Long term (current) use of insulin: Secondary | ICD-10-CM

## 2021-02-27 DIAGNOSIS — F418 Other specified anxiety disorders: Secondary | ICD-10-CM

## 2021-02-27 NOTE — Patient Instructions (Signed)
Visit Information:  Thank you for taking the time to speak with me today.   PATIENT GOALS: Goals Addressed            This Visit's Progress   . Track and Manage Fluids and Swelling-Heart Failure   On track    Timeframe:  Long-Range Goal Priority:  High Start Date:     11/07/2020                       Expected End Date:    05/22/2021                   Follow Up Date  03/13/2021  - Reinforced with patient to call office if I gain more than 3 pounds in one day or 5 pounds in one week - continue to follow a low salt diet.  - continue to watch for swelling in feet, ankles and legs every day. Report increase in symptoms to your doctor. Call 911 for severe heart failure symptoms.  - continue to weight yourself daily and record weights. - Continue to take your medications as prescribed and refill them timely.     Why is this important?    It is important to check your weight daily and watch how much salt and liquids you have.   It will help you to manage your heart failure.        . Track and manage my COPD symptoms.   On track    Timeframe:  Long-Range Goal Priority:  High Start Date:       11/07/2020                    Expected End Date:    05/22/2021             Follow up date: 03/13/2021  - eliminate any smoking in my home - Be mindful on pollen season which can cause your breathing issues to worsen.  Wear mask when going outside.  -  Continue to listen to public air quality announcements frequently.  -  Continue to monitor for signs of respiratory infection, including changes in sputum color, volume and thickness, as well as fever. Report these changes to your doctor as soon as possible.  - Continue to take your medications as prescribed.  - Continue to keep follow up appointments with your doctors  - Continue to keep yourself familiar with your  COPD action plan / zones.  Call your doctor for increase in ( moderate) COPD symptoms. Call 911 for severe symptoms.  -Continue to  use your oxygen as recommended by your provider and monitor oxygen saturation levels with pulse oximetry .  Why is this important?   Triggers are activities or things, like tobacco smoke or cold weather, that make your COPD (chronic obstructive pulmonary disease) flare-up.  Knowing these triggers helps you plan how to stay away from them.  When you cannot remove them, you can learn how to manage them.              Patient verbalizes understanding of instructions provided today and agrees to view in MyChart.   The patient has been provided with contact information for the care management team and has been advised to call with any health related questions or concerns.  The care management team will reach out to the patient again over the next 30 days.   George Ina RN,BSN,CCM RN Case Manager [place]  [phone]

## 2021-02-27 NOTE — Chronic Care Management (AMB) (Signed)
Chronic Care Management   CCM RN Visit Note  02/27/2021 Name: Kenneth [name] MRN: [id number] DOB: [birth year]  Subjective: Kenneth [name] is a 60 y.o. year old male who is a primary care patient of [name], [name], MD. The care management team was consulted for assistance with disease management and care coordination needs.    Engaged with patient by telephone for follow up visit in response to provider referral for case management and/or care coordination services.   Consent to Services:  The patient was given information about Chronic Care Management services, agreed to services, and gave verbal consent prior to initiation of services.  Please see initial visit note for detailed documentation.   Patient agreed to services and verbal consent obtained.   Assessment: Review of patient past medical history, allergies, medications, health status, including review of consultants reports, laboratory and other test data, was performed as part of comprehensive evaluation and provision of chronic care management services.   SDOH (Social Determinants of Health) assessments and interventions performed:    CCM Care Plan  Allergies  Allergen Reactions  . Lisinopril Swelling  . Other Other (See Comments)    Lettuce : rash  . Tomato Rash    Outpatient Encounter Medications as of 02/27/2021  Medication Sig Note  . acetaminophen (TYLENOL) 325 MG tablet Take 650 mg by mouth every 6 (six) hours as needed for mild pain or headache.   . albuterol (PROVENTIL) (2.5 MG/3ML) 0.083% nebulizer solution Take 3 mLs (2.5 mg total) by nebulization every 6 (six) hours as needed for shortness of breath.   [name] amLODipine (NORVASC) 5 MG tablet Take 5 mg by mouth every morning.   . ARIPiprazole (ABILIFY) 5 MG tablet Take 7.5 mg by mouth daily.   . busPIRone (BUSPAR) 15 MG tablet Take 1 tablet (15 mg total) by mouth 2 (two) times daily.   . carvedilol (COREG) 6.25 MG tablet Take 1 tablet (6.25 mg total) by mouth 2 (two)  times daily with a meal.   . clotrimazole (MYCELEX) 10 MG troche Take 1 tablet (10 mg total) by mouth 5 (five) times daily.   [name] ELIQUIS 5 MG TABS tablet TAKE ONE TABLET BY MOUTH EVERY MORNING and TAKE ONE TABLET BY MOUTH EVERY EVENING (Patient taking differently: Take 5 mg by mouth 2 (two) times daily.)   . FLUoxetine (PROZAC) 20 MG capsule Take 60 mg by mouth daily.   . Fluticasone-Umeclidin-Vilant (TRELEGY ELLIPTA) 100-62.5-25 MCG/INH AEPB Inhale 1 puff into the lungs daily.   . furosemide (LASIX) 40 MG tablet Take 1 tablet (40 mg total) by mouth 2 (two) times daily.   [name] guaiFENesin (MUCINEX) 600 MG 12 hr tablet Take 1 tablet (600 mg total) by mouth 2 (two) times daily.   . hydrocortisone (ANUSOL-HC) 25 MG suppository Place 1 suppository (25 mg total) rectally 2 (two) times daily.   . insulin glargine (LANTUS) 100 UNIT/ML injection Inject 0.15 mLs (15 Units total) into the skin at bedtime.   [name] ipratropium-albuterol (DUONEB) 0.5-2.5 (3) MG/3ML SOLN Take 3 mLs by nebulization every 6 (six) hours as needed.   . isosorbide mononitrate (IMDUR) 30 MG 24 hr tablet Take 30 mg by mouth daily.   [name] ketoconazole (NIZORAL) 2 % cream Apply 1 application topically daily.   [name] lubiprostone ([name]) 8 MCG capsule Take 1 capsule (8 mcg total) by mouth 2 (two) times daily with a meal.   . nitroGLYCERIN (NITROSTAT) 0.4 MG SL tablet DISSOLVE 1 TABLET UNDER THE TONGUE EVERY 5 MINUTES AS&nbsp;&nbsp;NEEDED  FOR CHEST PAIN. MAX&nbsp;&nbsp;OF 3 TABLETS IN 15 MINUTES. CALL 911 IF PAIN PERSISTS. (Patient taking differently: Place 0.4 mg under the tongue every 5 (five) minutes as needed for chest pain.) 10/04/2020: Last filled 07/25/20  . pantoprazole (PROTONIX) 40 MG tablet TAKE ONE TABLET BY MOUTH EVERY MORNING (Patient taking differently: Take 40 mg by mouth daily.)   . predniSONE (DELTASONE) 10 MG tablet Take 10 mg by mouth daily with breakfast.   . pseudoephedrine (SUDAFED) 60 MG tablet Take 60 mg by mouth every 4 (four)  hours as needed for congestion.   . roflumilast (DALIRESP) 500 MCG TABS tablet Take 1 tablet (500 mcg total) by mouth daily.   . rosuvastatin (CRESTOR) 20 MG tablet Take 1 tablet (20 mg total) by mouth daily.   . sacubitril-valsartan (ENTRESTO) 24-26 MG Take 1 tablet by mouth 2 (two) times daily.   [name] spironolactone (ALDACTONE) 25 MG tablet Take 1 tablet (25 mg total) by mouth daily.   . Blood Glucose Monitoring Suppl (ONE TOUCH ULTRA 2) w/Device KIT Use to test blood sugars 1-2 times daily.   [name] doxycycline (VIBRA-TABS) 100 MG tablet Take 1 tablet (100 mg total) by mouth every 12 (twelve) hours. (Patient not taking: Reported on 02/27/2021)   . Insulin Pen Needle (PEN NEEDLES) 31G X 6 MM MISC 1 application by Does not apply route at bedtime.   . Lancets (ONETOUCH ULTRASOFT) lancets USE TO TEST BLOOD SUGAR 1-2 TIMES DAILY    No facility-administered encounter medications on file as of 02/27/2021.    Patient Active Problem List   Diagnosis Date Noted  . Chronic kidney disease, stage 3a ([place]) 01/29/2021  . Acute on chronic combined systolic and diastolic CHF (congestive heart failure) ([place]) 01/22/2021  . Mixed diabetic hyperlipidemia associated with type 2 diabetes mellitus ([place]) 01/22/2021  . Tinea pedis 01/15/2021  . Pes planus 01/15/2021  . Chronic respiratory failure with hypoxia ([place]) 12/07/2020  . Oral candidiasis 12/07/2020  . Cardiac arrest ([place]) 10/04/2020  . Acute kidney injury superimposed on CKD ([place]) 09/23/2020  . Thiamine deficiency 07/02/2020  . Obstructive sleep apnea 05/02/2020  . Hyperlipidemia   . Chronic combined systolic and diastolic congestive heart failure ([place]) 04/07/2020  . Chronic constipation 07/19/2019  . Snoring 03/29/2019  . Insomnia due to other mental disorder 11/15/2018  . Anticoagulant long-term use 10/21/2018  . Gastroesophageal reflux disease without esophagitis 08/10/2018  . Tobacco abuse 08/10/2018  . Chest pain 07/27/2018  . Hypertension associated  with diabetes ([place]) 07/06/2018  . Chronic obstructive pulmonary disease ([place]) 07/06/2018  . Alcohol abuse 07/06/2018  . Paroxysmal atrial fibrillation ([place]) 11/28/2017  . Slow transit constipation 06/07/2016  . Chronic anemia 06/06/2016  . Hypertensive kidney disease with chronic kidney disease stage III ([place]) 06/06/2016  . Mixed anxiety and depressive disorder 06/05/2016  . Type 2 diabetes mellitus with stage 3a chronic kidney disease, with long-term current use of insulin ([place]) 06/05/2016    Conditions to be addressed/monitored:CHF, COPD, DMII and Anxiety  Care Plan : COPD (Adult)  Updates made by Dannielle Karvonen, RN since 02/27/2021 12:00 AM  Problem: Symptom Exacerbation (COPD)   Priority: High  Long-Range Goal: Symptom Exacerbation Prevented or Minimized   Start Date: 11/07/2020  Expected End Date: 05/22/2021  This Visit's Progress: Not on track  Recent Progress: On track  Priority: High  Current Barriers:  [name] Knowledge deficits related to basic COPD disease process and self care/management:  Per chart review patient hospitalized from 02/16/2021 to 02/19/2021.  Home with  palliative care services.  Primary office TOC completed on 02/21/2021.   Patient states he is feeling as well as expected.  He states he is still having increase shortness of breath and abdominal discomfort due to the coughing.  Patient reports using his albuterol inhaler x3 and nebulizer today. He reports his oxygen is currently at 3 liters but reports increasing to 3 1/2 when ambulating.  Patient reports having his medications and taking them as prescribed.  Case Manager Clinical Goal(s):   patient will be able to verbalize understanding of COPD action plan and when to seek appropriate levels of medical care:    patient will verbalize basic understanding of COPD disease process and self care activities  Interventions:  . Collaboration with [name], MD regarding development and update of comprehensive plan  of care as evidenced by provider attestation and co-signature . Inter-disciplinary care team collaboration (see longitudinal plan of care):   [name] Provided patient with verbal COPD education on self care/management/and exacerbation prevention  . Discussed COPD action plan and reinforced importance of daily self assessment   Medications reviewed with patient.   Discussed follow up provider appointments with patient:   Next follow up appointment with pulmonologist is 03/21/2021, follow up with primary care provider is 03/11/2021 Patient Goals/Self-Care Activities:  - eliminate any smoking in my home - Be mindful on pollen season which can cause your breathing issues to worsen.  Wear mask when going outside.  -  Continue to listen to public air quality announcements frequently.  -  Continue to monitor for signs of respiratory infection, including changes in sputum color, volume and thickness, as well as fever. Report these changes to your doctor as soon as possible.  - Continue to take your medications as prescribed.  - Continue to keep follow up appointments with your doctors  - Continue to keep yourself familiar with your  COPD action plan / zones.  Call your doctor for increase in ( moderate) COPD symptoms. Call 911 for severe symptoms.  -Continue to use your oxygen as recommended by your provider and monitor oxygen saturation levels with pulse oximetry .  Follow Up Plan: The patient has been provided with contact information for the care management team and has been advised to call with any health related questions or concerns.  The care management team will reach out to the patient again over the next 30 days.     Care Plan : Heart Failure (Adult)  Updates made by Dannielle Karvonen, RN since 02/27/2021 12:00 AM  Problem: Disease Progression (Heart Failure)   Priority: High  Onset Date: 11/07/2020  Long-Range Goal: Patient will verbalize understanding of heart failure self management strategies    Start Date: 11/07/2020  Expected End Date: 05/22/2021  This Visit's Progress: On track  Recent Progress: On track  Priority: High  Current Barriers:  [name] Knowledge deficit related to basic heart failure pathophysiology and self care management:  Per chart review patient hospitalized from 02/16/2021 to 02/19/2021.  Home with palliative care services.  Primary office TOC completed on 02/21/2021.   Patient states he is feeling as well as expected.  Patient reports today's weight is 204.4.  Patient states he does have some shortness of breath but denies any swelling. Patient states he continues to take his medications as prescribed.  He confirms his last cardiology visit was 02/07/2021.  [name] Case Manager Clinical Goal(s):  . patient will verbalize understanding of Heart Failure Action Plan and when to call doctor .  patient will take all Heart Failure medications as prescribed . patient will weigh daily and record (notifying MD of 3 lb weight gain over night or 5 lb in a week) . Patient will monitor / decrease sodium intake . Patient will contact the RN case manager if he has difficulty obtaining his medications.  . Patient will attend scheduled provider visits.  Interventions:  . Collaboration with [name], MD regarding development and update of comprehensive plan of care as evidenced by provider attestation and co-signature . Inter-disciplinary care team collaboration (see longitudinal plan of care): Patient states he continues to be seen by Palliative care nurse.   . Basic overview and discussion of pathophysiology of Heart Failure reviewed . Provided verbal education on low sodium diet . Discussed importance of daily weight and advised patient to weigh and record daily . Reviewed medications . Patient will follow up with his doctor as recommended:  Next follow up visit with primary care provider scheduled for 03/11/2021. Patient states he is unsure of his next cardiology follow up  appointment.  Advised patient per cardiology visit note on 02/07/2021 patient to follow up in 6 months or sooner if needed.  Patient Goals/Self-Care Activities - Reinforced with patient to call office if I gain more than 3 pounds in one day or 5 pounds in one week - continue to follow a low salt diet.  - continue to watch for swelling in feet, ankles and legs every day. Report increase in symptoms to your doctor. Call 911 for severe heart failure symptoms.  - continue to weight yourself daily and record weights. - Continue to take your medications as prescribed and refill them timely.  Follow Up Plan: The patient has been provided with contact information for the care management team and has been advised to call with any health related questions or concerns.  The care management team will reach out to the patient again over the next 30 days.         Plan:The patient has been provided with contact information for the care management team and has been advised to call with any health related questions or concerns.  and The care management team will reach out to the patient again over the next 30 days. Quinn Plowman RN,BSN,CCM RN Case Manager [place] [phone]

## 2021-02-28 ENCOUNTER — Ambulatory Visit: Payer: Medicare Other | Admitting: Physician Assistant

## 2021-03-01 ENCOUNTER — Inpatient Hospital Stay: Payer: Medicare Other | Admitting: Family Medicine

## 2021-03-05 ENCOUNTER — Telehealth: Payer: Self-pay | Admitting: Family Medicine

## 2021-03-05 NOTE — Telephone Encounter (Signed)
Patient just called and said that he needs forms completed and faxed today (if possible).

## 2021-03-05 NOTE — Telephone Encounter (Signed)
Spoke to patient and advised that the forms are on [employer] and we will call him when done.  Dm/cma

## 2021-03-05 NOTE — Telephone Encounter (Signed)
Forms placed on provider's desk to fill out. Dm/cma

## 2021-03-05 NOTE — Telephone Encounter (Signed)
Pt stopped by and dropped off forms to be filled out, he requested they be faxed when done and would also like a call back when done at [phone]. I placed in Dr Rudds folder up front

## 2021-03-06 ENCOUNTER — Other Ambulatory Visit: Payer: Self-pay | Admitting: Family Medicine

## 2021-03-06 DIAGNOSIS — Z794 Long term (current) use of insulin: Secondary | ICD-10-CM

## 2021-03-06 NOTE — Telephone Encounter (Signed)
Pt called in checking on status of his paperwork. I read him Dr. Vickii Penna message. He understood they will not be ready until next week. When completed he would like a cb letting him know.

## 2021-03-07 ENCOUNTER — Ambulatory Visit: Payer: Medicare Other | Admitting: Physician Assistant

## 2021-03-07 ENCOUNTER — Telehealth: Payer: Self-pay

## 2021-03-07 NOTE — Progress Notes (Signed)
Albuterol sulfate HFA 90 mcg/actuation aerosol inhaler last filled on 01/25/2021 for 30 day supply , PRN medication.  Hydralazine 25 mg last filled on 01/25/2021 for 30 day supply and patient receive 30 day supply from [facility] of Hydralazine 50 mg on 01/24/2021.  Everlean Cherry Clinical Pharmacist Assistant [phone]

## 2021-03-11 ENCOUNTER — Ambulatory Visit: Payer: Medicare Other | Admitting: Family Medicine

## 2021-03-11 NOTE — Telephone Encounter (Signed)
Pt has an appointment scheduled for 7/21. Per MD, ok to keep appointment.

## 2021-03-12 ENCOUNTER — Telehealth: Payer: Medicare Other

## 2021-03-12 NOTE — Telephone Encounter (Signed)
Patient notified VIA phone that form is ready for pick up.  Copy made for chart records.  Dm/cma

## 2021-03-13 ENCOUNTER — Telehealth: Payer: Self-pay

## 2021-03-13 ENCOUNTER — Telehealth: Payer: Medicare Other

## 2021-03-13 DIAGNOSIS — G4733 Obstructive sleep apnea (adult) (pediatric): Secondary | ICD-10-CM | POA: Diagnosis not present

## 2021-03-13 NOTE — Telephone Encounter (Signed)
  Care Management   Follow Up Note   03/13/2021 Name: Kenneth [name] MRN: [id number] DOB: June 26, 1961   Referred by: [name], MD Reason for referral : Care Coordination (CHF, COPD, DMII, anxiety)   An unsuccessful telephone outreach was attempted today. The patient was referred to the case management team for assistance with care management and care coordination.   Follow Up Plan: A HIPPA compliant phone message was left for the patient providing contact information and requesting a return call.   George Ina RN,BSN,CCM RN Case Manager [place] [phone]

## 2021-03-14 ENCOUNTER — Telehealth: Payer: Self-pay

## 2021-03-14 NOTE — Progress Notes (Signed)
Returning patient call per clinical pharmacist. Patient  significant other states she will have patient to call me back.  Everlean Cherry Clinical Pharmacist Assistant [phone]

## 2021-03-14 NOTE — Telephone Encounter (Signed)
This encounter was created in error - please disregard.

## 2021-03-17 ENCOUNTER — Other Ambulatory Visit: Payer: Self-pay | Admitting: Physician Assistant

## 2021-03-18 ENCOUNTER — Other Ambulatory Visit: Payer: Self-pay | Admitting: Family Medicine

## 2021-03-18 DIAGNOSIS — E119 Type 2 diabetes mellitus without complications: Secondary | ICD-10-CM

## 2021-03-20 DIAGNOSIS — J449 Chronic obstructive pulmonary disease, unspecified: Secondary | ICD-10-CM | POA: Diagnosis not present

## 2021-03-21 ENCOUNTER — Encounter: Payer: Self-pay | Admitting: Emergency Medicine

## 2021-03-21 ENCOUNTER — Other Ambulatory Visit: Payer: Self-pay

## 2021-03-21 ENCOUNTER — Telehealth: Payer: Self-pay | Admitting: *Deleted

## 2021-03-21 ENCOUNTER — Ambulatory Visit (INDEPENDENT_AMBULATORY_CARE_PROVIDER_SITE_OTHER): Payer: Medicare Other | Admitting: Emergency Medicine

## 2021-03-21 DIAGNOSIS — G4733 Obstructive sleep apnea (adult) (pediatric): Secondary | ICD-10-CM

## 2021-03-21 DIAGNOSIS — J9611 Chronic respiratory failure with hypoxia: Secondary | ICD-10-CM | POA: Diagnosis not present

## 2021-03-21 DIAGNOSIS — J441 Chronic obstructive pulmonary disease with (acute) exacerbation: Secondary | ICD-10-CM

## 2021-03-21 DIAGNOSIS — J449 Chronic obstructive pulmonary disease, unspecified: Secondary | ICD-10-CM | POA: Diagnosis not present

## 2021-03-21 NOTE — Assessment & Plan Note (Signed)
Good compliance with CPAP.  Plan to continue same nightly.  He may benefit at some point from a transition to a trilogy device or BiPAP.

## 2021-03-21 NOTE — Chronic Care Management (AMB) (Signed)
  Care Management   Note  03/21/2021 Name: Kenneth [name] MRN: [id number] DOB: 09-28-1960  Kenneth [name] is a 60 y.o. year old male who is a primary care patient of Veto Kemps, Bertram Millard, MD and is actively engaged with the care management team. I reached out to Gwenith Daily by phone today to assist with re-scheduling a follow up visit with the RN Case Manager  Follow up plan: Telephone appointment with care management team member scheduled for:7/132022  Burman Nieves, CCMA Care Guide, Embedded Care Coordination [facility]  Care Management  Direct Dial: [phone]

## 2021-03-21 NOTE — Progress Notes (Signed)
Subjective:    Patient ID: Kenneth [name], male    DOB: 1960/10/04, 60 y.o.   MRN: [id number]  HPI 60 year old smoker (50+ pack years) with a history of atrial fibrillation on anticoagulation, diabetes, hypertension, OSA on CPAP. He carries a history of asthma / COPD that was made many years ago. He is on Advair, uses albuterol several times a day, atrovent nebs qd.   ROV 02/21/21 --60 year old man with severe COPD/asthma, chronic bronchitic phenotype with multiple exacerbations.  He also has hypertension, atrial fibrillation, diabetes, chronic systolic CHF, OSA on CPAP.  Hypoxemic respiratory failure, currently on 3-4 L/min. Has been seen multiple times by TP since my last visit with him and he is also had multiple hospitalizations including an out of hospital PEA arrest.  He had a pseudomonal bronchitis/pneumonia on that hospitalization. Has been admitted twice in May 2022 with exacerbations of both heart failure and COPD.  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 He tells me that he would want MV if he had resp failure.  He remains on trelegy. Uses albuterol nebs 2-3x a day, HFA rarely.  He is on prednisone >> 10mg  daily. On daliresp.   ROV 03/21/21 --60 year old gentleman with continued tobacco use and multifactorial chronic hypoxic respiratory failure.  This in the setting of left-sided heart disease as above, COPD/asthma, OSA on CPAP. Currently managed on oxygen 3 to 4 L/min, Trelegy, Daliresp, prednisone 10 mg daily.  At his last visit we did speak some about possible input from palliative care or hospice services.  Also discussed goals for care and started consideration of CODE STATUS, currently full code. Today he reports that things have been overall stable - remains SOB when he exerts. He uses albuterol HFA or nebs about 3x a day. Some dry cough, occasional. His CPAP is having some leakage, intermittent. Usually wears through the entire night.  Denies any edema, significant wt shifts. He is smoking very rarely - 2 cigarettes in the last month. He has has an 46 visit once a week, checks vitals, works for    Review of Systems As per HPI      Objective:   Physical Exam  Vitals:   03/21/21 0905  BP: 136/74  Pulse: 66  Temp: 98.2 F (36.8 C)  TempSrc: Temporal  SpO2: 99%  Weight: 205 lb 6.4 oz (93.2 kg)  Height: 6' (1.829 m)   Gen: Pleasant, well-nourished, in no distress,  normal affect on his O2 by [place] 3L/min  ENT: No lesions,  mouth clear,  oropharynx clear, no postnasal drip  Neck: No JVD, no stridor  Lungs: No use of accessory muscles, distant , soft bilateral end expiratory wheezes, more prominent on forced expiration  Cardiovascular: RRR, heart sounds normal, no murmur or gallops, no peripheral edema  Musculoskeletal: No deformities, no cyanosis or clubbing  Neuro: alert, awake, non focal  Skin: Warm, no lesions or rash     Assessment & Plan:  Chronic obstructive pulmonary disease (HCC) Very limited functional capacity but overall stable.  Plan to continue same regimen.  He will likely benefit from palliative care input, suspect he will transition to hospice in coming months.  We did not talk about CODE STATUS today.  Please continue Trelegy 1 inhalation once daily.  Rinse and gargle after using. Continue prednisone 10 mg once daily. Continue Daliresp as you have been taking it. Keep your albuterol available to use either 1 nebulizer treatment or 2 puffs up  to every 4 hours if needed for shortness of breath, chest tightness, wheezing. Agree with palliative care support.  We will continue to work with them. Follow with APP in 2 months or sooner if you have any problems.  Follow Dr. Delton Coombes in 4 months.  Chronic respiratory failure with hypoxia (HCC) Continue oxygen 3-4 L/min pulsed depending on level of exertion  Obstructive sleep apnea Good compliance with CPAP.  Plan to continue same nightly.  He may  benefit at some point from a transition to a trilogy device or BiPAP.  Levy Pupa, MD, PhD 03/21/2021, 9:29 AM  Pulmonary and Critical Care [phone] or if no answer [phone]

## 2021-03-21 NOTE — Patient Instructions (Addendum)
Please continue Trelegy 1 inhalation once daily.  Rinse and gargle after using. Continue prednisone 10 mg once daily. Continue Daliresp as you have been taking it. Keep your albuterol available to use either 1 nebulizer treatment or 2 puffs up to every 4 hours if needed for shortness of breath, chest tightness, wheezing. Continue your oxygen at 3-4 L/min depending on your level of exertion. Agree with palliative care support.  We will continue to work with them. Congratulations on decreasing your smoking.  Our goal will be for you to stop altogether. Continue CPAP every night as you have been using it. Follow with APP in 2 months or sooner if you have any problems.  Follow Dr. Delton Coombes in 4 months.

## 2021-03-21 NOTE — Assessment & Plan Note (Signed)
Very limited functional capacity but overall stable.  Plan to continue same regimen.  He will likely benefit from palliative care input, suspect he will transition to hospice in coming months.  We did not talk about CODE STATUS today.  Please continue Trelegy 1 inhalation once daily.  Rinse and gargle after using. Continue prednisone 10 mg once daily. Continue Daliresp as you have been taking it. Keep your albuterol available to use either 1 nebulizer treatment or 2 puffs up to every 4 hours if needed for shortness of breath, chest tightness, wheezing. Agree with palliative care support.  We will continue to work with them. Follow with APP in 2 months or sooner if you have any problems.  Follow Dr. Delton Coombes in 4 months.

## 2021-03-21 NOTE — Chronic Care Management (AMB) (Signed)
  Care Management   Note  03/21/2021 Name: Jakeim Sedore MRN: [id number] DOB: 1961-09-02  Kwali Wrinkle is a 60 y.o. year old male who is a primary care patient of [name], MD and is actively engaged with the care management team. I reached out to Gwenith Daily by phone today to assist with re-scheduling a follow up visit with the RN Case Manager  Follow up plan: Unsuccessful telephone outreach attempt made. A HIPAA compliant phone message was left for the patient providing contact information and requesting a return call.  If patient returns call to provider office, please advise to call Embedded Care Management Care Guide Rozetta Stumpp at [phone]  Burman Nieves, CCMA Care Guide, Embedded Care Coordination [facility]  Care Management  Direct Dial: [phone]

## 2021-03-21 NOTE — Assessment & Plan Note (Signed)
Continue oxygen 3-4 L/min pulsed depending on level of exertion

## 2021-03-27 ENCOUNTER — Ambulatory Visit: Payer: Medicare Other | Admitting: Skilled Nursing Facility1

## 2021-04-03 ENCOUNTER — Telehealth: Payer: Self-pay

## 2021-04-03 ENCOUNTER — Telehealth: Payer: Medicare Other

## 2021-04-03 NOTE — Telephone Encounter (Signed)
  Care Management   Follow Up Note   04/03/2021 Name: Kenneth [name] MRN: [id number] DOB: May 31, 1961   Referred by: [name], MD Reason for referral : Care Coordination (CHF, COPD, DMII)   A second unsuccessful telephone outreach was attempted today. The patient was referred to the case management team for assistance with care management and care coordination.  Call to patient x 2.  Unable to leave voice message.  Phone only rang.  Follow Up Plan: RNCM will have care guide contact patient to reschedule appointment.   Kenneth Ina RN,BSN,CCM RN Case Manager Kenneth [name] [phone]

## 2021-04-06 ENCOUNTER — Emergency Department (HOSPITAL_COMMUNITY)

## 2021-04-06 ENCOUNTER — Inpatient Hospital Stay (HOSPITAL_COMMUNITY)

## 2021-04-06 ENCOUNTER — Inpatient Hospital Stay (HOSPITAL_COMMUNITY)
Admission: EM | Admit: 2021-04-06 | Discharge: 2021-04-09 | DRG: 190 | Disposition: A | Discharge status: Home / Self Care | Attending: Internal Medicine | Admitting: Internal Medicine

## 2021-04-06 ENCOUNTER — Other Ambulatory Visit: Payer: Self-pay

## 2021-04-06 ENCOUNTER — Encounter (HOSPITAL_COMMUNITY): Payer: Self-pay | Admitting: *Deleted

## 2021-04-06 DIAGNOSIS — J441 Chronic obstructive pulmonary disease with (acute) exacerbation: Secondary | ICD-10-CM | POA: Diagnosis not present

## 2021-04-06 DIAGNOSIS — R404 Transient alteration of awareness: Secondary | ICD-10-CM | POA: Diagnosis not present

## 2021-04-06 DIAGNOSIS — I499 Cardiac arrhythmia, unspecified: Secondary | ICD-10-CM | POA: Diagnosis not present

## 2021-04-06 DIAGNOSIS — I130 Hypertensive heart and chronic kidney disease with heart failure and stage 1 through stage 4 chronic kidney disease, or unspecified chronic kidney disease: Secondary | ICD-10-CM | POA: Diagnosis present

## 2021-04-06 DIAGNOSIS — E785 Hyperlipidemia, unspecified: Secondary | ICD-10-CM | POA: Diagnosis present

## 2021-04-06 DIAGNOSIS — Z20822 Contact with and (suspected) exposure to covid-19: Secondary | ICD-10-CM | POA: Diagnosis present

## 2021-04-06 DIAGNOSIS — E1169 Type 2 diabetes mellitus with other specified complication: Secondary | ICD-10-CM | POA: Diagnosis present

## 2021-04-06 DIAGNOSIS — R0603 Acute respiratory distress: Secondary | ICD-10-CM

## 2021-04-06 DIAGNOSIS — F418 Other specified anxiety disorders: Secondary | ICD-10-CM | POA: Diagnosis present

## 2021-04-06 DIAGNOSIS — Z7984 Long term (current) use of oral hypoglycemic drugs: Secondary | ICD-10-CM | POA: Diagnosis not present

## 2021-04-06 DIAGNOSIS — Z743 Need for continuous supervision: Secondary | ICD-10-CM | POA: Diagnosis not present

## 2021-04-06 DIAGNOSIS — G4733 Obstructive sleep apnea (adult) (pediatric): Secondary | ICD-10-CM | POA: Diagnosis present

## 2021-04-06 DIAGNOSIS — I152 Hypertension secondary to endocrine disorders: Secondary | ICD-10-CM | POA: Diagnosis present

## 2021-04-06 DIAGNOSIS — Z9981 Dependence on supplemental oxygen: Secondary | ICD-10-CM | POA: Diagnosis not present

## 2021-04-06 DIAGNOSIS — E1122 Type 2 diabetes mellitus with diabetic chronic kidney disease: Secondary | ICD-10-CM | POA: Diagnosis present

## 2021-04-06 DIAGNOSIS — Z7951 Long term (current) use of inhaled steroids: Secondary | ICD-10-CM

## 2021-04-06 DIAGNOSIS — I480 Paroxysmal atrial fibrillation: Secondary | ICD-10-CM | POA: Diagnosis present

## 2021-04-06 DIAGNOSIS — F17210 Nicotine dependence, cigarettes, uncomplicated: Secondary | ICD-10-CM | POA: Diagnosis present

## 2021-04-06 DIAGNOSIS — J439 Emphysema, unspecified: Secondary | ICD-10-CM | POA: Diagnosis not present

## 2021-04-06 DIAGNOSIS — Z8249 Family history of ischemic heart disease and other diseases of the circulatory system: Secondary | ICD-10-CM | POA: Diagnosis not present

## 2021-04-06 DIAGNOSIS — R6889 Other general symptoms and signs: Secondary | ICD-10-CM | POA: Diagnosis not present

## 2021-04-06 DIAGNOSIS — I2510 Atherosclerotic heart disease of native coronary artery without angina pectoris: Secondary | ICD-10-CM | POA: Diagnosis present

## 2021-04-06 DIAGNOSIS — Z833 Family history of diabetes mellitus: Secondary | ICD-10-CM | POA: Diagnosis not present

## 2021-04-06 DIAGNOSIS — N179 Acute kidney failure, unspecified: Secondary | ICD-10-CM | POA: Diagnosis present

## 2021-04-06 DIAGNOSIS — Z794 Long term (current) use of insulin: Secondary | ICD-10-CM

## 2021-04-06 DIAGNOSIS — N189 Chronic kidney disease, unspecified: Secondary | ICD-10-CM | POA: Diagnosis present

## 2021-04-06 DIAGNOSIS — Z7189 Other specified counseling: Secondary | ICD-10-CM

## 2021-04-06 DIAGNOSIS — Z9119 Patient's noncompliance with other medical treatment and regimen: Secondary | ICD-10-CM

## 2021-04-06 DIAGNOSIS — N1831 Chronic kidney disease, stage 3a: Secondary | ICD-10-CM | POA: Diagnosis present

## 2021-04-06 DIAGNOSIS — J9621 Acute and chronic respiratory failure with hypoxia: Secondary | ICD-10-CM | POA: Diagnosis present

## 2021-04-06 DIAGNOSIS — Z7952 Long term (current) use of systemic steroids: Secondary | ICD-10-CM | POA: Diagnosis not present

## 2021-04-06 DIAGNOSIS — I5042 Chronic combined systolic (congestive) and diastolic (congestive) heart failure: Secondary | ICD-10-CM | POA: Diagnosis present

## 2021-04-06 DIAGNOSIS — Z7901 Long term (current) use of anticoagulants: Secondary | ICD-10-CM

## 2021-04-06 DIAGNOSIS — R0602 Shortness of breath: Secondary | ICD-10-CM

## 2021-04-06 DIAGNOSIS — E1159 Type 2 diabetes mellitus with other circulatory complications: Secondary | ICD-10-CM | POA: Diagnosis present

## 2021-04-06 DIAGNOSIS — Z720 Tobacco use: Secondary | ICD-10-CM | POA: Diagnosis present

## 2021-04-06 LAB — COMPREHENSIVE METABOLIC PANEL
ALT: 27 U/L (ref 0–44)
AST: 24 U/L (ref 15–41)
Albumin: 3.6 g/dL (ref 3.5–5.0)
Alkaline Phosphatase: 69 U/L (ref 38–126)
Anion gap: 7 (ref 5–15)
BUN: 13 mg/dL (ref 6–20)
CO2: 39 mmol/L — ABNORMAL HIGH (ref 22–32)
Calcium: 9 mg/dL (ref 8.9–10.3)
Chloride: 88 mmol/L — ABNORMAL LOW (ref 98–111)
Creatinine, Ser: 1.71 mg/dL — ABNORMAL HIGH (ref 0.61–1.24)
GFR, Estimated: 46 mL/min — ABNORMAL LOW (ref >60)
Glucose, Bld: 310 mg/dL — ABNORMAL HIGH (ref 70–99)
Potassium: 3.8 mmol/L (ref 3.5–5.1)
Sodium: 134 mmol/L — ABNORMAL LOW (ref 135–145)
Total Bilirubin: 0.6 mg/dL (ref 0.3–1.2)
Total Protein: 6.3 g/dL — ABNORMAL LOW (ref 6.5–8.1)

## 2021-04-06 LAB — GLUCOSE, CAPILLARY
Glucose-Capillary: 376 mg/dL — ABNORMAL HIGH (ref 70–99)
Glucose-Capillary: 267 mg/dL — ABNORMAL HIGH (ref 70–99)

## 2021-04-06 LAB — BRAIN NATRIURETIC PEPTIDE: B Natriuretic Peptide: 444 pg/mL — ABNORMAL HIGH (ref 0.0–100.0)

## 2021-04-06 LAB — CBC WITH DIFFERENTIAL/PLATELET
Abs Immature Granulocytes: 0.23 10*3/uL — ABNORMAL HIGH (ref 0.00–0.07)
Basophils Absolute: 0 10*3/uL (ref 0.0–0.1)
Basophils Relative: 0 %
Eosinophils Absolute: 0.1 10*3/uL (ref 0.0–0.5)
Eosinophils Relative: 0 %
HCT: 33.8 % — ABNORMAL LOW (ref 39.0–52.0)
Hemoglobin: 9.7 g/dL — ABNORMAL LOW (ref 13.0–17.0)
Immature Granulocytes: 2 %
Lymphocytes Relative: 18 %
Lymphs Abs: 2.6 10*3/uL (ref 0.7–4.0)
MCH: 26.4 pg (ref 26.0–34.0)
MCHC: 28.7 g/dL — ABNORMAL LOW (ref 30.0–36.0)
MCV: 91.8 fL (ref 80.0–100.0)
Monocytes Absolute: 1.1 10*3/uL — ABNORMAL HIGH (ref 0.1–1.0)
Monocytes Relative: 8 %
Neutro Abs: 10.3 10*3/uL — ABNORMAL HIGH (ref 1.7–7.7)
Neutrophils Relative %: 72 %
Platelets: 263 10*3/uL (ref 150–400)
RBC: 3.68 MIL/uL — ABNORMAL LOW (ref 4.22–5.81)
RDW: 15.2 % (ref 11.5–15.5)
WBC: 14.4 10*3/uL — ABNORMAL HIGH (ref 4.0–10.5)
nRBC: 0.1 % (ref 0.0–0.2)

## 2021-04-06 LAB — BLOOD GAS, VENOUS
Acid-Base Excess: 13.4 mmol/L — ABNORMAL HIGH (ref 0.0–2.0)
Bicarbonate: 39.8 mmol/L — ABNORMAL HIGH (ref 20.0–28.0)
Drawn by: 6344
O2 Saturation: 61.3 %
Patient temperature: 37
pCO2, Ven: 78.4 mmHg (ref 44.0–60.0)
pH, Ven: 7.325 (ref 7.250–7.430)
pO2, Ven: 39.3 mmHg (ref 32.0–45.0)

## 2021-04-06 LAB — SARS CORONAVIRUS 2 (TAT 6-24 HRS): SARS Coronavirus 2: NEGATIVE

## 2021-04-06 MED ORDER — POLYETHYLENE GLYCOL 3350 17 G PO PACK
17.0000 g | PACK | Freq: Every day | ORAL | Status: DC | PRN
  Administered 2021-04-09: 17 g via ORAL
  Filled 2021-04-06: qty 1

## 2021-04-06 MED ORDER — ACETAMINOPHEN 650 MG RE SUPP: 650.0000 mg | Freq: Four times a day (QID) | RECTAL | Status: DC | PRN

## 2021-04-06 MED ORDER — LUBIPROSTONE 8 MCG PO CAPS
8.0000 ug | ORAL_CAPSULE | Freq: Two times a day (BID) | ORAL | Status: DC
  Administered 2021-04-06 – 2021-04-09 (×5): 8 ug via ORAL
  Filled 2021-04-06 (×8): qty 1

## 2021-04-06 MED ORDER — SENNA 8.6 MG PO TABS: 1.0000 | ORAL_TABLET | Freq: Every day | ORAL | Status: DC | PRN

## 2021-04-06 MED ORDER — BISACODYL 5 MG PO TBEC
5.0000 mg | DELAYED_RELEASE_TABLET | Freq: Every day | ORAL | Status: DC | PRN
Start: 2021-04-06 — End: 2021-04-09

## 2021-04-06 MED ORDER — FLUTICASONE-UMECLIDIN-VILANT 100-62.5-25 MCG/INH IN AEPB: 1.0000 | INHALATION_SPRAY | Freq: Every day | RESPIRATORY_TRACT | Status: DC

## 2021-04-06 MED ORDER — CARVEDILOL 12.5 MG PO TABS
12.5000 mg | ORAL_TABLET | Freq: Two times a day (BID) | ORAL | Status: DC
  Administered 2021-04-06 – 2021-04-09 (×6): 12.5 mg via ORAL
  Filled 2021-04-06 (×6): qty 1

## 2021-04-06 MED ORDER — ACETAMINOPHEN 325 MG PO TABS
650.0000 mg | ORAL_TABLET | Freq: Four times a day (QID) | ORAL | Status: DC | PRN
  Administered 2021-04-07 – 2021-04-09 (×4): 650 mg via ORAL
  Filled 2021-04-06 (×4): qty 2

## 2021-04-06 MED ORDER — DOCUSATE SODIUM 100 MG PO CAPS
100.0000 mg | ORAL_CAPSULE | Freq: Two times a day (BID) | ORAL | Status: DC
  Administered 2021-04-06 – 2021-04-09 (×4): 100 mg via ORAL
  Filled 2021-04-06 (×6): qty 1

## 2021-04-06 MED ORDER — NICOTINE 7 MG/24HR TD PT24
7.0000 mg | MEDICATED_PATCH | Freq: Every day | TRANSDERMAL | Status: DC
  Filled 2021-04-06 (×2): qty 1

## 2021-04-06 MED ORDER — UMECLIDINIUM BROMIDE 62.5 MCG/INH IN AEPB
1.0000 | INHALATION_SPRAY | Freq: Every day | RESPIRATORY_TRACT | Status: DC
  Administered 2021-04-07 – 2021-04-09 (×3): 1 via RESPIRATORY_TRACT
  Filled 2021-04-06: qty 7

## 2021-04-06 MED ORDER — INSULIN GLARGINE 100 UNIT/ML ~~LOC~~ SOLN
10.0000 [IU] | Freq: Every day | SUBCUTANEOUS | Status: DC
  Filled 2021-04-06: qty 0.1

## 2021-04-06 MED ORDER — LORAZEPAM 0.5 MG PO TABS
0.2500 mg | ORAL_TABLET | ORAL | Status: DC | PRN
  Administered 2021-04-07 – 2021-04-08 (×2): 0.5 mg via ORAL
  Filled 2021-04-06 (×2): qty 1

## 2021-04-06 MED ORDER — OXYCODONE HCL 5 MG PO TABS: 5.0000 mg | ORAL_TABLET | ORAL | Status: DC | PRN

## 2021-04-06 MED ORDER — FAMOTIDINE 20 MG PO TABS
20.0000 mg | ORAL_TABLET | Freq: Every day | ORAL | Status: DC
  Administered 2021-04-06 – 2021-04-09 (×4): 20 mg via ORAL
  Filled 2021-04-06 (×4): qty 1

## 2021-04-06 MED ORDER — AMLODIPINE BESYLATE 10 MG PO TABS
10.0000 mg | ORAL_TABLET | Freq: Every day | ORAL | Status: DC
  Administered 2021-04-06 – 2021-04-09 (×4): 10 mg via ORAL
  Filled 2021-04-06: qty 1
  Filled 2021-04-06: qty 2
  Filled 2021-04-06 (×2): qty 1

## 2021-04-06 MED ORDER — GUAIFENESIN ER 600 MG PO TB12
600.0000 mg | ORAL_TABLET | Freq: Two times a day (BID) | ORAL | Status: DC | PRN
  Administered 2021-04-06: 600 mg via ORAL
  Filled 2021-04-06: qty 1

## 2021-04-06 MED ORDER — INSULIN GLARGINE 100 UNIT/ML ~~LOC~~ SOLN
10.0000 [IU] | Freq: Every day | SUBCUTANEOUS | Status: DC
  Administered 2021-04-07 – 2021-04-09 (×3): 10 [IU] via SUBCUTANEOUS
  Filled 2021-04-06 (×3): qty 0.1

## 2021-04-06 MED ORDER — INSULIN GLARGINE 100 UNIT/ML ~~LOC~~ SOLN
20.0000 [IU] | Freq: Every day | SUBCUTANEOUS | Status: DC
  Administered 2021-04-06 – 2021-04-08 (×3): 20 [IU] via SUBCUTANEOUS
  Filled 2021-04-06 (×5): qty 0.2

## 2021-04-06 MED ORDER — ALBUTEROL (5 MG/ML) CONTINUOUS INHALATION SOLN
10.0000 mg/h | INHALATION_SOLUTION | RESPIRATORY_TRACT | Status: DC
  Administered 2021-04-06: 10 mg/h via RESPIRATORY_TRACT

## 2021-04-06 MED ORDER — INSULIN GLARGINE 100 UNIT/ML SOLOSTAR PEN: 45.0000 [IU] | PEN_INJECTOR | SUBCUTANEOUS | Status: DC

## 2021-04-06 MED ORDER — SODIUM CHLORIDE 0.9% FLUSH
3.0000 mL | Freq: Two times a day (BID) | INTRAVENOUS | Status: DC
  Administered 2021-04-06 – 2021-04-09 (×6): 3 mL via INTRAVENOUS

## 2021-04-06 MED ORDER — ROSUVASTATIN CALCIUM 20 MG PO TABS
20.0000 mg | ORAL_TABLET | Freq: Every day | ORAL | Status: DC
  Administered 2021-04-06 – 2021-04-08 (×3): 20 mg via ORAL
  Filled 2021-04-06 (×3): qty 1

## 2021-04-06 MED ORDER — ALBUTEROL SULFATE (2.5 MG/3ML) 0.083% IN NEBU
2.5000 mg | INHALATION_SOLUTION | RESPIRATORY_TRACT | Status: DC | PRN
  Administered 2021-04-06 (×3): 2.5 mg via RESPIRATORY_TRACT
  Filled 2021-04-06 (×3): qty 3

## 2021-04-06 MED ORDER — ROFLUMILAST 500 MCG PO TABS
500.0000 ug | ORAL_TABLET | Freq: Every day | ORAL | Status: DC
  Administered 2021-04-06 – 2021-04-09 (×4): 500 ug via ORAL
  Filled 2021-04-06 (×4): qty 1

## 2021-04-06 MED ORDER — ROSUVASTATIN CALCIUM 20 MG PO TABS: 20.0000 mg | ORAL_TABLET | Freq: Every day | ORAL | Status: DC

## 2021-04-06 MED ORDER — ENOXAPARIN SODIUM 40 MG/0.4ML IJ SOSY: 40.0000 mg | PREFILLED_SYRINGE | INTRAMUSCULAR | Status: DC

## 2021-04-06 MED ORDER — HYDRALAZINE HCL 20 MG/ML IJ SOLN: 5.0000 mg | INTRAMUSCULAR | Status: DC | PRN

## 2021-04-06 MED ORDER — ONDANSETRON HCL 4 MG/2ML IJ SOLN: 4.0000 mg | Freq: Four times a day (QID) | INTRAMUSCULAR | Status: DC | PRN

## 2021-04-06 MED ORDER — BUSPIRONE HCL 5 MG PO TABS
15.0000 mg | ORAL_TABLET | Freq: Three times a day (TID) | ORAL | Status: DC
  Administered 2021-04-06 – 2021-04-09 (×9): 15 mg via ORAL
  Filled 2021-04-06 (×8): qty 1
  Filled 2021-04-06: qty 2

## 2021-04-06 MED ORDER — MORPHINE SULFATE (PF) 2 MG/ML IV SOLN: 2.0000 mg | INTRAVENOUS | Status: DC | PRN

## 2021-04-06 MED ORDER — SODIUM CHLORIDE 0.9 % IV SOLN
500.0000 mg | Freq: Once | INTRAVENOUS | Status: AC
  Administered 2021-04-06: 500 mg via INTRAVENOUS
  Filled 2021-04-06: qty 500

## 2021-04-06 MED ORDER — ALBUTEROL (5 MG/ML) CONTINUOUS INHALATION SOLN
INHALATION_SOLUTION | RESPIRATORY_TRACT | Status: AC
  Filled 2021-04-06: qty 20

## 2021-04-06 MED ORDER — HYDRALAZINE HCL 25 MG PO TABS
25.0000 mg | ORAL_TABLET | Freq: Three times a day (TID) | ORAL | Status: DC
  Administered 2021-04-06 – 2021-04-09 (×9): 25 mg via ORAL
  Filled 2021-04-06 (×9): qty 1

## 2021-04-06 MED ORDER — PANTOPRAZOLE SODIUM 40 MG PO TBEC
40.0000 mg | DELAYED_RELEASE_TABLET | Freq: Every morning | ORAL | Status: DC
  Administered 2021-04-06 – 2021-04-09 (×4): 40 mg via ORAL
  Filled 2021-04-06 (×4): qty 1

## 2021-04-06 MED ORDER — APIXABAN 5 MG PO TABS
5.0000 mg | ORAL_TABLET | Freq: Two times a day (BID) | ORAL | Status: DC
  Administered 2021-04-06 – 2021-04-09 (×7): 5 mg via ORAL
  Filled 2021-04-06 (×7): qty 1

## 2021-04-06 MED ORDER — ISOSORBIDE MONONITRATE ER 30 MG PO TB24
30.0000 mg | ORAL_TABLET | Freq: Every day | ORAL | Status: DC
  Administered 2021-04-06 – 2021-04-09 (×4): 30 mg via ORAL
  Filled 2021-04-06 (×4): qty 1

## 2021-04-06 MED ORDER — METHYLPREDNISOLONE SODIUM SUCC 125 MG IJ SOLR: 60.0000 mg | Freq: Two times a day (BID) | INTRAMUSCULAR | Status: DC

## 2021-04-06 MED ORDER — ONDANSETRON HCL 4 MG PO TABS: 4.0000 mg | ORAL_TABLET | Freq: Four times a day (QID) | ORAL | Status: DC | PRN

## 2021-04-06 MED ORDER — SODIUM CHLORIDE 0.9 % IV SOLN
2.0000 g | Freq: Three times a day (TID) | INTRAVENOUS | Status: DC
  Administered 2021-04-06 – 2021-04-07 (×3): 2 g via INTRAVENOUS
  Filled 2021-04-06 (×3): qty 2

## 2021-04-06 MED ORDER — FLUTICASONE FUROATE-VILANTEROL 200-25 MCG/INH IN AEPB
1.0000 | INHALATION_SPRAY | Freq: Every day | RESPIRATORY_TRACT | Status: DC
  Administered 2021-04-07 – 2021-04-09 (×3): 1 via RESPIRATORY_TRACT
  Filled 2021-04-06: qty 28

## 2021-04-06 MED ORDER — LACTATED RINGERS IV SOLN: INTRAVENOUS | Status: AC

## 2021-04-06 MED ORDER — IPRATROPIUM-ALBUTEROL 0.5-2.5 (3) MG/3ML IN SOLN
3.0000 mL | Freq: Four times a day (QID) | RESPIRATORY_TRACT | Status: DC
  Administered 2021-04-06 – 2021-04-09 (×11): 3 mL via RESPIRATORY_TRACT
  Filled 2021-04-06 (×13): qty 3

## 2021-04-06 MED ORDER — METHYLPREDNISOLONE SODIUM SUCC 125 MG IJ SOLR
80.0000 mg | Freq: Two times a day (BID) | INTRAMUSCULAR | Status: DC
  Administered 2021-04-06 – 2021-04-07 (×3): 80 mg via INTRAVENOUS
  Filled 2021-04-06 (×3): qty 2

## 2021-04-06 MED ORDER — HYDROCODONE-ACETAMINOPHEN 5-325 MG PO TABS: 1.0000 | ORAL_TABLET | ORAL | Status: DC | PRN

## 2021-04-06 MED ORDER — FLUOXETINE HCL 20 MG PO CAPS
60.0000 mg | ORAL_CAPSULE | Freq: Every day | ORAL | Status: DC
  Administered 2021-04-06 – 2021-04-09 (×4): 60 mg via ORAL
  Filled 2021-04-06 (×4): qty 3

## 2021-04-06 MED ORDER — ARIPIPRAZOLE 5 MG PO TABS
7.5000 mg | ORAL_TABLET | Freq: Every day | ORAL | Status: DC
  Administered 2021-04-06 – 2021-04-09 (×4): 7.5 mg via ORAL
  Filled 2021-04-06 (×5): qty 2

## 2021-04-06 NOTE — ED Notes (Signed)
Pt wanted to use bathroom for a BM, became short of breath- placed on bedpan- increasing shortness of breath--

## 2021-04-06 NOTE — ED Triage Notes (Signed)
Patient presents to ed via [facility] found patient to be in tripod with no air movement. Patient was given Mag. 2gm , epip .3  Solu-Medrol 125 mg Duo Neb x 2 per ems patient was being bagged upon arrival , Patient was alert breathing much better changed to NRB.

## 2021-04-06 NOTE — ED Provider Notes (Signed)
[facility] Provider Note   CSN: [id number] Arrival date & time: 04/06/21  0715     History Chief Complaint  Patient presents with   Shortness of Breath    Kenneth [name] is a 60 y.o. male.  HPI Patient presents via EMS in respiratory distress, level 5 caveat secondary to acuity of condition. History is obtained by EMS providers, patient cannot do much more than nod. Seemingly the patient was having difficulty breathing today, EMS was notified.  On arrival the patient was found to be tachypneic, apneic, requiring bagging via bag-valve-mask ventilation, DuoNeb, Solu-Medrol, epinephrine, magnesium.  Patient was not alert enough, reportedly, to tolerate CPAP. On the time of arrival the patient has, however, switched from bag-valve-mask to nonrebreather mask.    Past Medical History:  Diagnosis Date   Anxiety    Arthritis    Atrial fibrillation (HCC)    Chest tightness 06/12/2019   CHF (congestive heart failure) (HCC)    COPD (chronic obstructive pulmonary disease) ([place])    emphysema   Depression    Diabetes mellitus without complication (HCC)    Heart murmur    Hyperlipidemia    Hypertension    Sleep apnea with use of continuous positive airway pressure (CPAP)     Patient Active Problem List   Diagnosis Date Noted   Chronic kidney disease, stage 3a ([place]) 01/29/2021   Acute on chronic combined systolic and diastolic CHF (congestive heart failure) ([place]) 01/22/2021   Mixed diabetic hyperlipidemia associated with type 2 diabetes mellitus ([place]) 01/22/2021   Tinea pedis 01/15/2021   Pes planus 01/15/2021   Chronic respiratory failure with hypoxia ([place]) 12/07/2020   Oral candidiasis 12/07/2020   Cardiac arrest ([place]) 10/04/2020   Acute kidney injury superimposed on CKD ([place]) 09/23/2020   Thiamine deficiency 07/02/2020   Obstructive sleep apnea 05/02/2020   Hyperlipidemia    Chronic combined systolic and diastolic congestive heart failure  ([place]) 04/07/2020   Chronic constipation 07/19/2019   Snoring 03/29/2019   Insomnia due to other mental disorder 11/15/2018   Anticoagulant long-term use 10/21/2018   Gastroesophageal reflux disease without esophagitis 08/10/2018   Tobacco abuse 08/10/2018   Chest pain 07/27/2018   Hypertension associated with diabetes ([place]) 07/06/2018   Chronic obstructive pulmonary disease ([place]) 07/06/2018   Alcohol abuse 07/06/2018   Paroxysmal atrial fibrillation ([place]) 11/28/2017   Slow transit constipation 06/07/2016   Chronic anemia 06/06/2016   Hypertensive kidney disease with chronic kidney disease stage III ([place]) 06/06/2016   Mixed anxiety and depressive disorder 06/05/2016   Type 2 diabetes mellitus with stage 3a chronic kidney disease, with long-term current use of insulin ([place]) 06/05/2016    Past Surgical History:  Procedure Laterality Date   NO PAST SURGERIES         Family History  Problem Relation Age of Onset   Diabetes Mother    Heart disease Mother    Diabetes Father    Heart disease Father    Diabetes Sister    Heart disease Sister    Kidney disease Sister    Coronary artery disease Brother    Liver disease Maternal Uncle     Social History   Tobacco Use   Smoking status: Some Days    Packs/day: 2.50    Years: 30.00    Pack years: 75.00    Types: Cigarettes    Last attempt to quit: 06/2020    Years since quitting: 0.7   Smokeless tobacco: Never  Vaping Use  Vaping Use: Never used  Substance Use Topics   Alcohol use: Yes    Comment: pt stated that it was 6 months ago he drunk bourbon   Drug use: Yes    Frequency: 1.0 times per week    Types: Marijuana    Comment: occ marijuana    Home Medications Prior to Admission medications   Medication Sig Start Date End Date Taking? Authorizing Provider  acetaminophen (TYLENOL) 325 MG tablet Take 650 mg by mouth every 6 (six) hours as needed for mild pain or headache.    [provider]  albuterol  (PROVENTIL) (2.5 MG/3ML) 0.083% nebulizer solution Take 3 mLs (2.5 mg total) by nebulization every 6 (six) hours as needed for shortness of breath. 12/25/20   Aline August, MD  amLODipine (NORVASC) 5 MG tablet Take 5 mg by mouth every morning. 10/30/20   [provider]  ARIPiprazole (ABILIFY) 5 MG tablet Take 7.5 mg by mouth daily.    [provider]  Blood Glucose Monitoring Suppl (ONE TOUCH ULTRA 2) w/Device KIT Use to test blood sugars 1-2 times daily. 04/02/20   [name], MD  busPIRone (BUSPAR) 15 MG tablet Take 1 tablet (15 mg total) by mouth 2 (two) times daily. 09/06/19   [name], MD  carvedilol (COREG) 6.25 MG tablet Take 1 tablet (6.25 mg total) by mouth 2 (two) times daily with a meal. 02/19/21   Nita Sells, MD  clotrimazole (MYCELEX) 10 MG troche Take 1 tablet (10 mg total) by mouth 5 (five) times daily. 12/07/20   Parrett, Fonnie Mu, NP  doxycycline (VIBRA-TABS) 100 MG tablet Take 1 tablet (100 mg total) by mouth every 12 (twelve) hours. 01/31/21   Ghimire, Henreitta Leber, MD  ELIQUIS 5 MG TABS tablet TAKE ONE TABLET BY MOUTH EVERY MORNING and TAKE ONE TABLET BY MOUTH EVERY EVENING Patient taking differently: Take 5 mg by mouth 2 (two) times daily. 10/22/20   Buford Dresser, MD  FLUoxetine (PROZAC) 20 MG capsule Take 60 mg by mouth daily.    [provider]  Fluticasone-Umeclidin-Vilant (TRELEGY ELLIPTA) 100-62.5-25 MCG/INH AEPB Inhale 1 puff into the lungs daily. 04/02/20   Collene Gobble, MD  furosemide (LASIX) 40 MG tablet Take 1 tablet (40 mg total) by mouth 2 (two) times daily. 08/02/20   Buford Dresser, MD  guaiFENesin (MUCINEX) 600 MG 12 hr tablet Take 1 tablet (600 mg total) by mouth 2 (two) times daily. 09/24/20   Regalado, Belkys A, MD  hydrocortisone (ANUSOL-HC) 25 MG suppository Place 1 suppository (25 mg total) rectally 2 (two) times daily. 02/19/21   Nita Sells, MD  insulin glargine (LANTUS) 100  UNIT/ML injection Inject 0.15 mLs (15 Units total) into the skin at bedtime. 02/19/21   Nita Sells, MD  Insulin Pen Needle (PEN NEEDLES) 31G X 6 MM MISC 1 application by Does not apply route at bedtime. 11/03/20   Nche, Charlene Brooke, NP  ipratropium-albuterol (DUONEB) 0.5-2.5 (3) MG/3ML SOLN Take 3 mLs by nebulization every 6 (six) hours as needed. 09/24/20   Regalado, Belkys A, MD  isosorbide mononitrate (IMDUR) 30 MG 24 hr tablet Take 30 mg by mouth daily. 07/04/20   [provider]  ketoconazole (NIZORAL) 2 % cream Apply 1 application topically daily. 01/15/21   Criselda Peaches, DPM  Lancets [facility] ULTRASOFT) lancets USE TO TEST BLOOD SUGAR 1-2 TIMES DAILY 05/22/20   [name], MD  lubiprostone ([name]) 8 MCG capsule Take 1 capsule (8 mcg total) by mouth  2 (two) times daily with a meal. 01/28/21   [name], [name], MD  metFORMIN (GLUCOPHAGE) 500 MG tablet TAKE ONE TABLET BY MOUTH EVERY MORNING and TAKE ONE TABLET BY MOUTH EVERY EVENING 03/18/21   [name], MD  nitroGLYCERIN (NITROSTAT) 0.4 MG SL tablet DISSOLVE 1 TABLET UNDER THE TONGUE EVERY 5 MINUTES AS&nbsp;&nbsp;NEEDED FOR CHEST PAIN. MAX&nbsp;&nbsp;OF 3 TABLETS IN 15 MINUTES. CALL 911 IF PAIN PERSISTS. Patient taking differently: Place 0.4 mg under the tongue every 5 (five) minutes as needed for chest pain. 07/25/20   [name], MD  pantoprazole (PROTONIX) 40 MG tablet TAKE ONE TABLET BY MOUTH EVERY MORNING 03/18/21   Esterwood, Amy S, PA-C  predniSONE (DELTASONE) 10 MG tablet Take 10 mg by mouth daily with breakfast.    [provider]  pseudoephedrine (SUDAFED) 60 MG tablet Take 60 mg by mouth every 4 (four) hours as needed for congestion.    [provider]  roflumilast (DALIRESP) 500 MCG TABS tablet Take 1 tablet (500 mcg total) by mouth daily. 02/08/21   [name], MD  rosuvastatin (CRESTOR) 20 MG tablet Take 1 tablet (20 mg total) by mouth daily. 02/25/21   [name], MD  sacubitril-valsartan (ENTRESTO) 24-26 MG Take 1 tablet by mouth 2 (two) times daily. 02/19/21   Nita Sells, MD  spironolactone (ALDACTONE) 25 MG tablet Take 1 tablet (25 mg total) by mouth daily. 02/25/21   [name], MD    Allergies    Lisinopril, Other, and Tomato  Review of Systems   Review of Systems  Unable to perform ROS: Acuity of condition   Physical Exam Updated Vital Signs BP 130/63   Pulse (!) 110   Temp (!) 97 F (36.1 C) (Temporal)   Resp 20   Ht 6' (1.829 m)   Wt 91.6 kg   SpO2 100%   BMI 27.40 kg/m   Physical Exam Vitals and nursing note reviewed.  Constitutional:      General: He is in acute distress.     Appearance: He is well-developed. He is ill-appearing and diaphoretic.  HENT:     Head: Normocephalic and atraumatic.  Eyes:     Conjunctiva/sclera: Conjunctivae normal.  Cardiovascular:     Rate and Rhythm: Regular rhythm. Tachycardia present.  Pulmonary:     Effort: Tachypnea, accessory muscle usage and respiratory distress present.     Breath sounds: Decreased breath sounds and wheezing present.  Abdominal:     General: There is no distension.  Skin:    General: Skin is warm.  Neurological:     Mental Status: He is alert and oriented to person, place, and time.    ED Results / Procedures / Treatments   Labs (all labs ordered are listed, but only abnormal results are displayed) Labs Reviewed  COMPREHENSIVE METABOLIC PANEL - Abnormal; Notable for the following components:      Result Value   Sodium 134 (*)    Chloride 88 (*)    CO2 39 (*)    Glucose, Bld 310 (*)    Creatinine, Ser 1.71 (*)    Total Protein 6.3 (*)    GFR, Estimated 46 (*)    All other components within normal limits  CBC WITH DIFFERENTIAL/PLATELET - Abnormal; Notable for the following components:   WBC 14.4 (*)    RBC 3.68 (*)    Hemoglobin 9.7 (*)    HCT 33.8 (*)    MCHC 28.7 (*)    Neutro Abs  10.3 (*)    Monocytes Absolute 1.1 (*)     Abs Immature Granulocytes 0.23 (*)    All other components within normal limits  BRAIN NATRIURETIC PEPTIDE - Abnormal; Notable for the following components:   B Natriuretic Peptide 444.0 (*)    All other components within normal limits  SARS CORONAVIRUS 2 (TAT 6-24 HRS)    EKG EKG Interpretation  Date/Time:  Saturday April 06 2021 07:21:32 EDT Ventricular Rate:  105 PR Interval:  179 QRS Duration: 144 QT Interval:  331 QTC Calculation: 438 R Axis:   72 Text Interpretation: Sinus tachycardia LVH with secondary repolarization abnormality ST-t wave abnormality Artifact Abnormal ECG Confirmed by Carmin Muskrat [phone]) on 04/06/2021 7:37:24 AM  Radiology DG Chest Port 1 View  Result Date: 04/06/2021 CLINICAL DATA:  60 year old male with shortness of breath. EXAM: PORTABLE CHEST 1 VIEW COMPARISON:  Portable chest 02/16/2021 and earlier. FINDINGS: Portable AP upright view at 0741 hours. Cardiac size at the upper limits of normal. Other mediastinal contours are within normal limits. Visualized tracheal air column is within normal limits. Improved lung volumes since May, normal. Allowing for portable technique the lungs are clear. But there is attenuation of upper lobe bronchovascular markings which corresponds to emphysema on CTA in January this year. No pneumothorax or pleural effusion. No osseous abnormality identified. IMPRESSION: Emphysema.  No acute cardiopulmonary abnormality. Electronically Signed   By: Genevie Ann M.D.   On: 04/06/2021 08:01    Procedures Procedures   Medications Ordered in ED Medications  albuterol (PROVENTIL,VENTOLIN) solution continuous neb (0 mg/hr Nebulization Stopped 04/06/21 0822)  albuterol (VENTOLIN) (5 MG/ML) 0.5% continuous inhalation solution (  Not Given 04/06/21 6294)    ED Course  I have reviewed the triage vital signs and the nursing notes.  Pertinent labs & imaging results that were available during my care of the patient were reviewed by me and  considered in my medical decision making (see chart for details).  With consideration of respiratory distress on arrival the patient was immediately switched to our monitoring equipment, continue to receive supplemental oxygen via nonrebreather mask.  Patient started on continuous albuterol with assistance of respiratory therapy. Initial vitals notable for tachycardia, 100s, sinus, abnormal Pulse ox 97% with supplemental oxygen abnormal  Update: Patient improved  10:43 AM Patient now committed by his wife.  Patient remains tachypneic, with slight increased work of breathing, but is mentating seemingly normally.  He has no pain.  Have reviewed all findings, no evidence for pneumonia on x-ray, labs generally noncontributory.  He confirms that he does have home oxygen dependency, 24/7 with BiPAP as needed. Given the patient's presentation for respiratory distress, suspicion for COPD exacerbation he will require admission for ongoing monitoring, management. No early evidence for pneumonia, bacteremia, sepsis, lower suspicion for PE given his history.  COVID test pending on admission MDM Rules/Calculators/A&P MDM Number of Diagnoses or Management Options Respiratory distress: new, needed workup   Amount and/or Complexity of Data Reviewed Clinical lab tests: ordered and reviewed Tests in the radiology section of CPT: ordered and reviewed Tests in the medicine section of CPT: reviewed and ordered Decide to obtain previous medical records or to obtain history from someone other than the patient: yes Obtain history from someone other than the patient: yes Review and summarize past medical records: yes Discuss the patient with other providers: yes Independent visualization of images, tracings, or specimens: yes  Risk of Complications, Morbidity, and/or Mortality Presenting problems: high Diagnostic procedures: high Management options: high  Critical Care Total time providing critical care:  30-74 minutes (45)  Patient Progress Patient progress: improved   Final Clinical Impression(s) / ED Diagnoses Final diagnoses:  Respiratory distress     Carmin Muskrat, MD 04/06/21 1045

## 2021-04-06 NOTE — ED Notes (Signed)
NRB changed to O2 @ 3L/M/[place]-- lunch tray ordered

## 2021-04-06 NOTE — ED Notes (Signed)
Pt assisted to bedside commode

## 2021-04-06 NOTE — Progress Notes (Addendum)
Called by RN that pt has increase SOB and struggling to breathing when got up to bedside commode.  IVF stopped. RT putting on Bipap per RN report. Had elevated BNP when admitted.  Obtain ABG.  Obtain Stat CXR.  Will give lasix if pulmonary edema identified

## 2021-04-06 NOTE — Progress Notes (Signed)
Called to room stat by secretary who stated she was not sure what was needed, the nurse in the room requesting me stat. Upon my arrival, another RN is retrieving meds out of pyxis for neb. Pt in severe distress, had got up to use bedside commode and on side of the bed "tripoding". Albuterol neb given. I asked pt if he has worn BIPAP before and did he feel like he needed BIPAP, pt shook head "yes". Pt placed on BIPAP and Duoneb given through BIPAP. Pt able to speak now and breathing has slowed along with being less labored.

## 2021-04-06 NOTE — H&P (Signed)
History and Physical    Kenneth [name] MRN:[id number] DOB: 02/22/1961 DOA: 04/06/2021  PCP: [name], Stephen M, MD Consultants:  Byrum - pulmonology; Christopher - cardiology; palliative care; Nandigam - GI Patient coming from:  Home - lives with wife; NOK: Wife, Kenneth [name], [phone]  Chief Complaint: SOB  HPI: Kenneth [name] is a 59 y.o. male with medical history significant of afib; COPD; DM; HTN; HLD; and OSA on CPAP presenting with SOB.  He reports that he has been fatigued and not sleeping well.  He has been more SOB lately.  He denies worsened cough, but his wife reports increased cough.  No fever, no sick contacts.  Last night, he took an OTC sleep aid and slept better than he has in a long time.  He did not wear his BIPAP - he feels like the settings are off and need adjustment.  He awoke very SOB in the middle of the night and used several puffs of his inhaler.  He was even more SOB this AM upon awakening and he used his neb without improvement and so asked his wife to call 911.    I called and spoke with Hospice.  If family and patient want admission, this is appropriate.  He was admitted to hospice in early June and remains a full code.  We briefly discussed goals of care.  The patient still wants to be a full code.  We discussed the high likelihood that he would be unable to come off the ventilator should he require intubation, and he was asked to consider long-term ventilator requirements, tracheostomy, etc.  ED Course: COPD exacerbation.  On home O2 and BIPAP prn and qhs.  Respiratory distress - meg, epi, nebs, steroids.  Bagged in the field.  Better after continuous neb but needs ongoing therapy.  Review of Systems: As per HPI; otherwise review of systems reviewed and negative.    COVID Vaccine Status:   Complete plus one booster (10/2020)  Past Medical History:  Diagnosis Date   Anxiety    Arthritis    Atrial fibrillation (HCC)    Chest tightness 06/12/2019   CHF  (congestive heart failure) (HCC)    COPD (chronic obstructive pulmonary disease) (HCC)    emphysema   Depression    Diabetes mellitus without complication (HCC)    Heart murmur    Hyperlipidemia    Hypertension    Sleep apnea with use of continuous positive airway pressure (CPAP)     Past Surgical History:  Procedure Laterality Date   NO PAST SURGERIES      Social History   Socioeconomic History   Marital status: Divorced    Spouse name: Not on file   Number of children: 1   Years of education: Not on file   Highest education level: Associate degree: occupational, technical, or vocational program  Occupational History   Occupation: disabled    Comment: Disability $1100/mo  Tobacco Use   Smoking status: Some Days    Packs/day: 2.50    Years: 30.00    Pack years: 75.00    Types: Cigarettes    Last attempt to quit: 06/2020    Years since quitting: 0.7   Smokeless tobacco: Never  Vaping Use   Vaping Use: Never used  Substance and Sexual Activity   Alcohol use: Yes    Comment: pt stated that it was 6 months ago he drunk bourbon   Drug use: Yes    Frequency: 1.0 times per week    Types:   Marijuana    Comment: occ marijuana   Sexual activity: Yes    Partners: Female    Birth control/protection: None  Other Topics Concern   Not on file  Social History Narrative   Not on file   Social Determinants of Health   Financial Resource Strain: Low Risk    Difficulty of Paying Living Expenses: Not hard at all  Food Insecurity: No Food Insecurity   Worried About Charity fundraiser in the Last Year: Never true   Arboriculturist in the Last Year: Never true  Transportation Needs: No Transportation Needs   Lack of Transportation (Medical): No   Lack of Transportation (Non-Medical): No  Physical Activity: Sufficiently Active   Days of Exercise per Week: 2 days   Minutes of Exercise per Session: 90 min  Stress: Stress Concern Present   Feeling of Stress : To some extent   Social Connections: Not on file  Intimate Partner Violence: Not At Risk   Fear of Current or Ex-Partner: No   Emotionally Abused: No   Physically Abused: No   Sexually Abused: No    Allergies  Allergen Reactions   Lisinopril Swelling   Other Other (See Comments)    Lettuce : rash   Tomato Rash    Family History  Problem Relation Age of Onset   Diabetes Mother    Heart disease Mother    Diabetes Father    Heart disease Father    Diabetes Sister    Heart disease Sister    Kidney disease Sister    Coronary artery disease Brother    Liver disease Maternal Uncle     Prior to Admission medications   Medication Sig Start Date End Date Taking? Authorizing Provider  acetaminophen (TYLENOL) 325 MG tablet Take 650 mg by mouth every 6 (six) hours as needed for mild pain or headache.    [provider]  albuterol (PROVENTIL) (2.5 MG/3ML) 0.083% nebulizer solution Take 3 mLs (2.5 mg total) by nebulization every 6 (six) hours as needed for shortness of breath. 12/25/20   Aline August, MD  amLODipine (NORVASC) 5 MG tablet Take 5 mg by mouth every morning. 10/30/20   [provider]  ARIPiprazole (ABILIFY) 5 MG tablet Take 7.5 mg by mouth daily.    [provider]  Blood Glucose Monitoring Suppl (ONE TOUCH ULTRA 2) w/Device KIT Use to test blood sugars 1-2 times daily. 04/02/20   [name], MD  busPIRone (BUSPAR) 15 MG tablet Take 1 tablet (15 mg total) by mouth 2 (two) times daily. 09/06/19   [name], MD  carvedilol (COREG) 6.25 MG tablet Take 1 tablet (6.25 mg total) by mouth 2 (two) times daily with a meal. 02/19/21   Nita Sells, MD  clotrimazole (MYCELEX) 10 MG troche Take 1 tablet (10 mg total) by mouth 5 (five) times daily. 12/07/20   Parrett, Fonnie Mu, NP  doxycycline (VIBRA-TABS) 100 MG tablet Take 1 tablet (100 mg total) by mouth every 12 (twelve) hours. 01/31/21   Ghimire, Henreitta Leber, MD  ELIQUIS 5 MG TABS tablet TAKE ONE  TABLET BY MOUTH EVERY MORNING and TAKE ONE TABLET BY MOUTH EVERY EVENING Patient taking differently: Take 5 mg by mouth 2 (two) times daily. 10/22/20   Buford Dresser, MD  FLUoxetine (PROZAC) 20 MG capsule Take 60 mg by mouth daily.    [provider]  Fluticasone-Umeclidin-Vilant (TRELEGY ELLIPTA) 100-62.5-25 MCG/INH AEPB Inhale 1 puff into the lungs daily. 04/02/20  Byrum, Robert S, MD  furosemide (LASIX) 40 MG tablet Take 1 tablet (40 mg total) by mouth 2 (two) times daily. 08/02/20   Christopher, Bridgette, MD  guaiFENesin (MUCINEX) 600 MG 12 hr tablet Take 1 tablet (600 mg total) by mouth 2 (two) times daily. 09/24/20   Regalado, Belkys A, MD  hydrocortisone (ANUSOL-HC) 25 MG suppository Place 1 suppository (25 mg total) rectally 2 (two) times daily. 02/19/21   Samtani, Jai-Gurmukh, MD  insulin glargine (LANTUS) 100 UNIT/ML injection Inject 0.15 mLs (15 Units total) into the skin at bedtime. 02/19/21   Samtani, Jai-Gurmukh, MD  Insulin Pen Needle (PEN NEEDLES) 31G X 6 MM MISC 1 application by Does not apply route at bedtime. 11/03/20   Nche, Charlotte Lum, NP  ipratropium-albuterol (DUONEB) 0.5-2.5 (3) MG/3ML SOLN Take 3 mLs by nebulization every 6 (six) hours as needed. 09/24/20   Regalado, Belkys A, MD  isosorbide mononitrate (IMDUR) 30 MG 24 hr tablet Take 30 mg by mouth daily. 07/04/20   [provider]  ketoconazole (NIZORAL) 2 % cream Apply 1 application topically daily. 01/15/21   McDonald, Adam R, DPM  Lancets (ONETOUCH ULTRASOFT) lancets USE TO TEST BLOOD SUGAR 1-2 TIMES DAILY 05/22/20   Kremer, William Alfred, MD  lubiprostone ([name]) 8 MCG capsule Take 1 capsule (8 mcg total) by mouth 2 (two) times daily with a meal. 01/28/21   [name], Stephen M, MD  metFORMIN (GLUCOPHAGE) 500 MG tablet TAKE ONE TABLET BY MOUTH EVERY MORNING and TAKE ONE TABLET BY MOUTH EVERY EVENING 03/18/21   Kremer, William Alfred, MD  nitroGLYCERIN (NITROSTAT) 0.4 MG SL tablet DISSOLVE 1 TABLET UNDER  THE TONGUE EVERY 5 MINUTES AS&nbsp;&nbsp;NEEDED FOR CHEST PAIN. MAX&nbsp;&nbsp;OF 3 TABLETS IN 15 MINUTES. CALL 911 IF PAIN PERSISTS. Patient taking differently: Place 0.4 mg under the tongue every 5 (five) minutes as needed for chest pain. 07/25/20   Kremer, William Alfred, MD  pantoprazole (PROTONIX) 40 MG tablet TAKE ONE TABLET BY MOUTH EVERY MORNING 03/18/21   Esterwood, Amy S, PA-C  predniSONE (DELTASONE) 10 MG tablet Take 10 mg by mouth daily with breakfast.    [provider]  pseudoephedrine (SUDAFED) 60 MG tablet Take 60 mg by mouth every 4 (four) hours as needed for congestion.    [provider]  roflumilast (DALIRESP) 500 MCG TABS tablet Take 1 tablet (500 mcg total) by mouth daily. 02/08/21   [name], Stephen M, MD  rosuvastatin (CRESTOR) 20 MG tablet Take 1 tablet (20 mg total) by mouth daily. 02/25/21   [name], Stephen M, MD  sacubitril-valsartan (ENTRESTO) 24-26 MG Take 1 tablet by mouth 2 (two) times daily. 02/19/21   Samtani, Jai-Gurmukh, MD  spironolactone (ALDACTONE) 25 MG tablet Take 1 tablet (25 mg total) by mouth daily. 02/25/21   [name], Stephen M, MD    Physical Exam: Vitals:   04/06/21 1500 04/06/21 1515 04/06/21 1545 04/06/21 1600  BP: (!) 136/121 140/85 (!) 144/84 (!) 152/88  Pulse: 70 89 86 92  Resp: 16 16 17 17  Temp:      TempSrc:      SpO2: 99% 100% 100% 100%  Weight:      Height:         General:  Appears chronically ill, dyspneic with conversation despite NRB O2 Eyes:   EOMI, normal lids, iris ENT:  grossly normal hearing, lips & tongue, mmm; poor dentition Neck:  no LAD, masses or thyromegaly Cardiovascular:  RRR, no m/r/g with distant heart sounds. No LE edema.  Respiratory:     Very poor diffuse air movement with expiratory wheezes.  Increased respiratory effort, particularly with conversation.  On NRB O2. Abdomen:  soft, NT, ND Back:   normal alignment, no CVAT Skin:  no rash or induration seen on limited exam Musculoskeletal:  grossly normal  tone BUE/BLE, good ROM, no bony abnormality Lower extremity:  No LE edema.  Limited foot exam with no ulcerations.  2+ distal pulses. Psychiatric:  blunted mood and affect, speech fluent and appropriate, AOx3 Neurologic:  CN 2-12 grossly intact, moves all extremities in coordinated fashion    Radiological Exams on Admission: Independently reviewed - see discussion in A/P where applicable  DG Chest Port 1 View  Result Date: 04/06/2021 CLINICAL DATA:  59-year-old male with shortness of breath. EXAM: PORTABLE CHEST 1 VIEW COMPARISON:  Portable chest 02/16/2021 and earlier. FINDINGS: Portable AP upright view at 0741 hours. Cardiac size at the upper limits of normal. Other mediastinal contours are within normal limits. Visualized tracheal air column is within normal limits. Improved lung volumes since May, normal. Allowing for portable technique the lungs are clear. But there is attenuation of upper lobe bronchovascular markings which corresponds to emphysema on CTA in January this year. No pneumothorax or pleural effusion. No osseous abnormality identified. IMPRESSION: Emphysema.  No acute cardiopulmonary abnormality. Electronically Signed   By: H  Hall M.D.   On: 04/06/2021 08:01    EKG: Independently reviewed.  Sinus tachycardia with rate 105; nonspecific ST changes with no evidence of acute ischemia   Labs on Admission: I have personally reviewed the available labs and imaging studies at the time of the admission.  Pertinent labs:   CO2 39 Glucose 310 BUN 13/Creatinine 1.71/GFR 46; 14/1.22/>60 on 5/31 BNP 444.0 - stable WBC 14.4 Hgb 9.7   Assessment/Plan Principal Problem:   COPD exacerbation (HCC) Active Problems:   Hypertension associated with diabetes (HCC)   Mixed anxiety and depressive disorder   Goals of care, counseling/discussion   Paroxysmal atrial fibrillation (HCC)   Tobacco abuse   Chronic combined systolic and diastolic congestive heart failure (HCC)    Hyperlipidemia   Obstructive sleep apnea   Acute kidney injury superimposed on CKD (HCC)   Acute on chronic respiratory failure associated with a COPD exacerbation -Patient with end stage COPD for which he is enrolled in hospice presenting with acute on subacute shortness of breath, most likely caused by acute COPD exacerbation.  -He required bag respirations during transport -Likely also contributed to by BIPAP non-compliance in the setting of OSA -He wears home O2 chronically but is currently on NRB O2 -He sees pulmonology and so they have been notified of admission, although there are no obvious acute needs at this time -Chest x-ray is not consistent with pneumonia -He was given a continuous neb treatment in the ED with some improvement.   -will admit patient - it seems likely that he will need several days of hospitalization to show sufficient improvement for discharge, and also appears to need ongoing education regarding the severity of his underlying illness. -Nebulizers: scheduled Duoneb and prn albuterol -Solu-Medrol 80 mg IV BID; he takes prednisone 10 mg daily at home  -IV Cefepime for severe exacerbation -Continue Trelegy, Daliresp -Coordinated care with TOC team/PT/OT/Nutrition/RT consults  AFib -Rate controlled with Coreg -Continue Eliquis  CAD/chronic systolic CHF -Continue Imdur -Hold Entresto, Aldactone, Lasix given current renal function  DM -Prior recent (12/23/20) A1c was 14.4, indicating very poor control -Continue Lantus -Cover with moderate-scale SSI  -Further goals of care consideration is important   to determine diabetes management goals  HTN -Continue Norvasc, Coreg, hydralazine  Mood d/o -Continue Abilify, Buspar, Prozac, Ativan  HLD -Continue Crestor  Stage 3a CKD -Currently with mild AKI -Hold diuretics and nephrotoxic medications  OSA -Wears nightly BIPAP but recently noncompliant -His wife reports that she "tried it too" and that she agrees  that it needs to be adjusted -Will order nightly BIPAP and RT can adjust as needed  Tobacco dependence -Despite end-stage COPD and continuous O2 dependence, he is continuing to smoke -Cessation encouraged -Patch ordered  Goals of care -Patient appears to need significant ongoing education regarding end-stage COPD -He is being treated with q2h prn Oxycodone and yet is also a full code -We discussed long-term trach dependence and the low likelihood of someone with such severe lung disease ever coming off a ventilator -Ongoing discussion is encouraged -Patient is enrolled in hospice; needs code status discussion and family education if he transitions to DNR    Note: This patient has been tested and is pending for the novel coronavirus COVID-19. He has been fully vaccinated against COVID-19.    DVT prophylaxis: Eliquis Code Status:  Full - confirmed with patient/wife Family Communication: Wife was present throughout evaluation Disposition Plan:  The patient is from: home with hospice  Anticipated d/c is to: home with hospice  Anticipated d/c date will depend on clinical response to treatment, but possibly in the next 2-3 days if he has excellent response to treatment  Patient is currently: acutely ill Consults called: TOC team/PT/OT/Nutrition/RT  Admission status: Admit - It is my clinical opinion that admission to INPATIENT is reasonable and necessary because this patient will require at least 2 midnights in the hospital to treat this condition based on the medical complexity of the problems presented.  Given the aforementioned information, the predictability of an adverse outcome is felt to be significant.       MD [facility]   How to contact the [facility] Attending or Consulting provider 7A - 7P or covering provider during after hours 7P -7A, for this patient?  Check the care team in [facility] and look for a) attending/consulting [facility] provider listed and b) the [facility] team  listed Log into [email or web address] and use [facility]'s universal password to access. If you do not have the password, please contact the hospital operator. Locate the [facility] provider you are looking for under [facility] and page to a number that you can be directly reached. If you still have difficulty reaching the provider, please page the DOC (Director on Call) for the Hospitalists listed on amion for assistance.   04/06/2021, 5:01 PM    

## 2021-04-07 DIAGNOSIS — J441 Chronic obstructive pulmonary disease with (acute) exacerbation: Secondary | ICD-10-CM | POA: Diagnosis not present

## 2021-04-07 LAB — GLUCOSE, CAPILLARY
Glucose-Capillary: 269 mg/dL — ABNORMAL HIGH (ref 70–99)
Glucose-Capillary: 197 mg/dL — ABNORMAL HIGH (ref 70–99)

## 2021-04-07 LAB — CBC
HCT: 30.8 % — ABNORMAL LOW (ref 39.0–52.0)
Hemoglobin: 9.1 g/dL — ABNORMAL LOW (ref 13.0–17.0)
MCH: 26.3 pg (ref 26.0–34.0)
MCHC: 29.5 g/dL — ABNORMAL LOW (ref 30.0–36.0)
MCV: 89 fL (ref 80.0–100.0)
Platelets: 209 10*3/uL (ref 150–400)
RBC: 3.46 MIL/uL — ABNORMAL LOW (ref 4.22–5.81)
RDW: 15.7 % — ABNORMAL HIGH (ref 11.5–15.5)
WBC: 10.4 10*3/uL (ref 4.0–10.5)
nRBC: 0 % (ref 0.0–0.2)

## 2021-04-07 LAB — BASIC METABOLIC PANEL
Anion gap: 5 (ref 5–15)
BUN: 14 mg/dL (ref 6–20)
CO2: 38 mmol/L — ABNORMAL HIGH (ref 22–32)
Calcium: 9.1 mg/dL (ref 8.9–10.3)
Chloride: 89 mmol/L — ABNORMAL LOW (ref 98–111)
Creatinine, Ser: 1.28 mg/dL — ABNORMAL HIGH (ref 0.61–1.24)
GFR, Estimated: >60 mL/min (ref >60)
Glucose, Bld: 210 mg/dL — ABNORMAL HIGH (ref 70–99)
Potassium: 4.3 mmol/L (ref 3.5–5.1)
Sodium: 132 mmol/L — ABNORMAL LOW (ref 135–145)

## 2021-04-07 LAB — PROCALCITONIN: Procalcitonin: 0.51 ng/mL

## 2021-04-07 MED ORDER — INSULIN ASPART 100 UNIT/ML IJ SOLN
5.0000 [IU] | Freq: Once | INTRAMUSCULAR | Status: AC
  Administered 2021-04-07: 5 [IU] via SUBCUTANEOUS

## 2021-04-07 MED ORDER — ALUM & MAG HYDROXIDE-SIMETH 200-200-20 MG/5ML PO SUSP
15.0000 mL | Freq: Four times a day (QID) | ORAL | Status: DC | PRN
  Administered 2021-04-07: 15 mL via ORAL
  Filled 2021-04-07: qty 30

## 2021-04-07 MED ORDER — MORPHINE SULFATE (PF) 2 MG/ML IV SOLN: 1.0000 mg | INTRAVENOUS | Status: DC | PRN

## 2021-04-07 NOTE — Assessment & Plan Note (Signed)
Recent (12/23/20) A1c was 14.4%, very poorly controlled --Continue Lantus --Novolog moderate SSI

## 2021-04-07 NOTE — Assessment & Plan Note (Signed)
Continue Crestor 

## 2021-04-07 NOTE — Assessment & Plan Note (Addendum)
--  Currently with mild AKI - resolved.   --Resume Lasix, continue holding Aldactone and Entresto for now --Hold other nephrotoxic medications & renally dose meds --Monitor BMP

## 2021-04-07 NOTE — Assessment & Plan Note (Signed)
Despite end-stage COPD and continuous O2 dependence, he is continuing to smoke --Cessation encouraged --Nicotine patch ordered

## 2021-04-07 NOTE — Assessment & Plan Note (Addendum)
End stage COPD on hospice, p/w severe respiratory distress, required bag respirations during transport. Suspect due to non-compliance with BiPAP at home. Chronic Hypoxia on O2 at home.  Required NRB mask and BiPAP on arrival. Follow with pulmonology - notified of admission.  No acute needs to warrant consult at this time.  Chest Xray without PNA. --Nebulizers: scheduled Duoneb and prn albuterol --Reduce Solu-Medrol to 60 mg q24h --(on chronic prednisone 10 mg daily at home) --Zithromax x 5 days --Continue formulary-equivalent for Trelegy --Continue Daliresp --Coordinated care with [facility] team/PT/OT/Nutrition/RT consults --Ongoing education regarding the severity of his underlying illness

## 2021-04-07 NOTE — Assessment & Plan Note (Signed)
-  Continue Norvasc, Coreg, hydralazine ?

## 2021-04-07 NOTE — Assessment & Plan Note (Signed)
--  Rate controlled with Coreg --Continue Eliquis

## 2021-04-07 NOTE — Progress Notes (Signed)
PROGRESS NOTE    Kenneth [name]   NTI:[id number]  DOB: 03-03-1961  PCP: [name], MD    DOA: 04/06/2021 LOS: 1   Assessment & Plan   Principal Problem:   COPD exacerbation (HCC) Active Problems:   Goals of care, counseling/discussion   Hypertension associated with diabetes (HCC)   Paroxysmal atrial fibrillation (HCC)   Chronic combined systolic and diastolic congestive heart failure (HCC)   Type 2 diabetes mellitus with stage 3a chronic kidney disease, with long-term current use of insulin (HCC)   Acute kidney injury superimposed on CKD (HCC)   Mixed anxiety and depressive disorder   Tobacco abuse   Hyperlipidemia   Obstructive sleep apnea   COPD exacerbation (HCC) End stage COPD on hospice, p/w severe respiratory distress, required bag respirations during transport. Suspect due to non-compliance with BiPAP at home. Chronic Hypoxia on O2 at home.  Required NRB mask and BiPAP on arrival. Follow with pulmonology - notified of admission.  No acute needs to warrant consult at this time.  Chest Xray without PNA. --Nebulizers: scheduled Duoneb and prn albuterol --Solu-Medrol 80 mg IV BID (on prednisone 10 mg daily at home) --Stop IV Cefepime  --Continue formulary-equivalent for Trelegy --Continue Daliresp --Coordinated care with [facility] team/PT/OT/Nutrition/RT consults --Ongoing education regarding the severity of his underlying illness   Paroxysmal atrial fibrillation (HCC) --Rate controlled with Coreg --Continue Eliquis  Chronic combined systolic and diastolic congestive heart failure (HCC) --Continue Imdur --Hold Entresto, Aldactone, Lasix given current renal function  Type 2 diabetes mellitus with stage 3a chronic kidney disease, with long-term current use of insulin (HCC) Recent (12/23/20) A1c was 14.4%, very poorly controlled --Continue Lantus --Novolog moderate SSI   Hypertension associated with diabetes (HCC) --Continue Norvasc, Coreg, hydralazine  Mixed  anxiety and depressive disorder --Continue Abilify, Buspar, Prozac, Ativan  Hyperlipidemia --Continue Crestor  Acute kidney injury superimposed on CKD (HCC) --Currently with mild AKI --Hold diuretics and nephrotoxic medications --Monitor BMP  Obstructive sleep apnea --Wears nightly BIPAP but recently noncompliant --Home machine and/or settings needs to be adjusted per pt and wife --BIPAP nightly and PRN  Tobacco abuse Despite end-stage COPD and continuous O2 dependence, he is continuing to smoke --Cessation encouraged --Nicotine patch ordered  Goals of care, counseling/discussion Per Dr. Ophelia Charter' H&P: "Patient appears to need significant ongoing education regarding end-stage COPD He is being treated with q2h prn Oxycodone and yet is also a full code We discussed long-term trach dependence and the low likelihood of someone with such severe lung disease ever coming off a ventilator Ongoing discussion is encouraged Patient is enrolled in hospice; needs code status discussion and family education if he transitions to DNR"   Patient BMI: Body mass index is 26.67 kg/m.   DVT prophylaxis:  apixaban (ELIQUIS) tablet 5 mg   Diet:  Diet Orders (From admission, onward)     Start     Ordered   04/06/21 1130  Diet heart healthy/carb modified Room service appropriate? Yes; Fluid consistency: Thin  Diet effective now       Question Answer Comment  Diet-HS Snack? Nothing   Room service appropriate? Yes   Fluid consistency: Thin      04/06/21 1134              Code Status: Full Code   Brief Narrative / Hospital Course to Date:   60 y.o. male with medical history significant of afib; end stage COPD on home o2 followed by hospice; DM; HTN; HLD; and OSA on CPAP presenting  with severe SOB.    Admitted with acute exacerbation on advanced COPD.  Subjective 04/07/21    Pt reports feeling better this AM.  Admits to often sleeping/napping in daytime, then being up much of night, so  not using bipap at home like he should recently.  Denies fever/chills, increased phlegm production or otherwise feeling sick aside from SOB.   Disposition Plan & Communication   Status is: Inpatient  Remains inpatient appropriate because:Inpatient level of care appropriate due to severity of illness  Dispo: The patient is from: Home              Anticipated d/c is to: Home              Patient currently is not medically stable to d/c.   Difficult to place patient No   Consults, Procedures, Significant Events   Consultants:  none  Procedures:  none  Antimicrobials:  Anti-infectives (From admission, onward)    Start     Dose/Rate Route Frequency Ordered Stop   04/06/21 1200  ceFEPIme (MAXIPIME) 2 g in sodium chloride 0.9 % 100 mL IVPB  Status:  Discontinued        2 g 200 mL/hr over 30 Minutes Intravenous Every 8 hours 04/06/21 1134 04/07/21 0929   04/06/21 1045  azithromycin (ZITHROMAX) 500 mg in sodium chloride 0.9 % 250 mL IVPB        500 mg 250 mL/hr over 60 Minutes Intravenous  Once 04/06/21 1040 04/06/21 1608         Micro    Objective   Vitals:   04/07/21 0258 04/07/21 0300 04/07/21 0747 04/07/21 0800  BP: (!) 164/87   131/70  Pulse: 92  88 84  Resp: 20  19 18   Temp: 98.4 F (36.9 C)   98.1 F (36.7 C)  TempSrc: Oral   Oral  SpO2: 100% 98% 99%   Weight: 89.2 kg     Height:        Intake/Output Summary (Last 24 hours) at 04/07/2021 1519 Last data filed at 04/07/2021 0555 Gross per 24 hour  Intake 757.53 ml  Output 450 ml  Net 307.53 ml   Filed Weights   04/06/21 0737 04/06/21 1900 04/07/21 0258  Weight: 91.6 kg 90.3 kg 89.2 kg    Physical Exam:  General exam: awake, alert, no acute distress HEENT: moist mucus membranes, hearing grossly normal  Respiratory system: very poor air movement, no wheezes, rales or rhonchi, normal respiratory effort at rest, on 3 L/min [place] O2. Cardiovascular system: normal S1/S2, RRR, no pedal edema.   Central  nervous system: A&O x3. no gross focal neurologic deficits, normal speech Extremities: moves all , no edema, normal tone Psychiatry: normal mood, congruent affect, judgement and insight appear normal  Labs   Data Reviewed: I have personally reviewed following labs and imaging studies  CBC: Recent Labs  Lab 04/06/21 0725 04/07/21 0155  WBC 14.4* 10.4  NEUTROABS 10.3*  --   HGB 9.7* 9.1*  HCT 33.8* 30.8*  MCV 91.8 89.0  PLT 263 209   Basic Metabolic Panel: Recent Labs  Lab 04/06/21 0725 04/07/21 0155  NA 134* 132*  K 3.8 4.3  CL 88* 89*  CO2 39* 38*  GLUCOSE 310* 210*  BUN 13 14  CREATININE 1.71* 1.28*  CALCIUM 9.0 9.1   GFR: Estimated Creatinine Clearance: 68.2 mL/min (A) (by C-G formula based on SCr of 1.28 mg/dL (H)). Liver Function Tests: Recent Labs  Lab 04/06/21 0725  AST  24  ALT 27  ALKPHOS 69  BILITOT 0.6  PROT 6.3*  ALBUMIN 3.6   No results for input(s): LIPASE, AMYLASE in the last 168 hours. No results for input(s): AMMONIA in the last 168 hours. Coagulation Profile: No results for input(s): INR, PROTIME in the last 168 hours. Cardiac Enzymes: No results for input(s): CKTOTAL, CKMB, CKMBINDEX, TROPONINI in the last 168 hours. BNP (last 3 results) No results for input(s): PROBNP in the last 8760 hours. HbA1C: No results for input(s): HGBA1C in the last 72 hours. CBG: Recent Labs  Lab 04/06/21 1839 04/06/21 2122 04/07/21 0545  GLUCAP 376* 267* 269*   Lipid Profile: No results for input(s): CHOL, HDL, LDLCALC, TRIG, CHOLHDL, LDLDIRECT in the last 72 hours. Thyroid Function Tests: No results for input(s): TSH, T4TOTAL, FREET4, T3FREE, THYROIDAB in the last 72 hours. Anemia Panel: No results for input(s): VITAMINB12, FOLATE, FERRITIN, TIBC, IRON, RETICCTPCT in the last 72 hours. Sepsis Labs: Recent Labs  Lab 04/07/21 0155  PROCALCITON 0.51    Recent Results (from the past 240 hour(s))  SARS CORONAVIRUS 2 (TAT 6-24 HRS) Nasopharyngeal  Nasopharyngeal Swab     Status: None   Collection Time: 04/06/21  7:28 AM   Specimen: Nasopharyngeal Swab  Result Value Ref Range Status   SARS Coronavirus 2 NEGATIVE NEGATIVE Final    Comment: (NOTE) SARS-CoV-2 target nucleic acids are NOT DETECTED.  The SARS-CoV-2 RNA is generally detectable in upper and lower respiratory specimens during the acute phase of infection. Negative results do not preclude SARS-CoV-2 infection, do not rule out co-infections with other pathogens, and should not be used as the sole basis for treatment or other patient management decisions. Negative results must be combined with clinical observations, patient history, and epidemiological information. The expected result is Negative.  Fact Sheet for Patients: HairSlick.no  Fact Sheet for Healthcare Providers: quierodirigir.com  This test is not yet approved or cleared by the [place] FDA and  has been authorized for detection and/or diagnosis of SARS-CoV-2 by FDA under an Emergency Use Authorization (EUA). This EUA will remain  in effect (meaning this test can be used) for the duration of the COVID-19 declaration under Se ction 564(b)(1) of the Act, 21 U.S.C. section 360bbb-3(b)(1), unless the authorization is terminated or revoked sooner.  Performed at [facility], [address] [place], [place] [address]       Imaging Studies   DG CHEST PORT 1 VIEW  Result Date: 04/06/2021 CLINICAL DATA:  Shortness of breath EXAM: PORTABLE CHEST 1 VIEW COMPARISON:  04/06/2021, CT 10/03/2020 FINDINGS: Hyperinflation with emphysema. No focal opacity or pleural effusion. Normal cardiomediastinal silhouette with aortic atherosclerosis. No pneumothorax. IMPRESSION: No active disease.  Emphysema Electronically Signed   By: Jasmine Pang M.D.   On: 04/06/2021 20:16   DG Chest Port 1 View  Result Date: 04/06/2021 CLINICAL DATA:  60 year old male with  shortness of breath. EXAM: PORTABLE CHEST 1 VIEW COMPARISON:  Portable chest 02/16/2021 and earlier. FINDINGS: Portable AP upright view at 0741 hours. Cardiac size at the upper limits of normal. Other mediastinal contours are within normal limits. Visualized tracheal air column is within normal limits. Improved lung volumes since May, normal. Allowing for portable technique the lungs are clear. But there is attenuation of upper lobe bronchovascular markings which corresponds to emphysema on CTA in January this year. No pneumothorax or pleural effusion. No osseous abnormality identified. IMPRESSION: Emphysema.  No acute cardiopulmonary abnormality. Electronically Signed   By: Althea Grimmer.D.  On: 04/06/2021 08:01     Medications   Scheduled Meds:  amLODipine  10 mg Oral Daily   apixaban  5 mg Oral BID   ARIPiprazole  7.5 mg Oral Daily   busPIRone  15 mg Oral TID   carvedilol  12.5 mg Oral BID WC   docusate sodium  100 mg Oral BID   famotidine  20 mg Oral Daily   FLUoxetine  60 mg Oral Daily   fluticasone furoate-vilanterol  1 puff Inhalation Daily   hydrALAZINE  25 mg Oral TID   insulin glargine  10 Units Subcutaneous Daily   insulin glargine  20 Units Subcutaneous QHS   ipratropium-albuterol  3 mL Nebulization Q6H   isosorbide mononitrate  30 mg Oral Daily   lubiprostone  8 mcg Oral BID WC   methylPREDNISolone (SOLU-MEDROL) injection  80 mg Intravenous Q12H   pantoprazole  40 mg Oral q morning   roflumilast  500 mcg Oral Daily   rosuvastatin  20 mg Oral QHS   sodium chloride flush  3 mL Intravenous Q12H   umeclidinium bromide  1 puff Inhalation Daily   Continuous Infusions:     LOS: 1 day    Time spent: 30 minutes    Pennie Banter, DO [facility]  04/07/2021, 3:19 PM      If 7PM-7AM, please contact night-coverage. How to contact the [facility] Attending or Consulting provider 7A - 7P or covering provider during after hours 7P -7A, for this patient?    Check the care  team in [facility] and look for a) attending/consulting [facility] provider listed and b) the [facility] team listed Log into [email or web address] and use [place]'s universal password to access. If you do not have the password, please contact the hospital operator. Locate the [facility] provider you are looking for under [facility] and page to a number that you can be directly reached. If you still have difficulty reaching the provider, please page the [facility] (Director on Call) for the Hospitalists listed on amion for assistance.

## 2021-04-07 NOTE — Progress Notes (Signed)
RT stuck pt x2 for ABG and unable to obtain. Paged MD to ask if venous blood gas is an option due to pt being difficult stick and in pain.

## 2021-04-07 NOTE — Evaluation (Signed)
Physical Therapy Evaluation Patient Details Name: Kenneth [name] MRN: [id number] DOB: 1961-03-15 Today's Date: 04/07/2021   History of Present Illness  60 y.o. male presenting to ED on 7/16 for SOB. Pt admitted to hospice in early June, remaining a full code. CXR not consistent with pneumonia. ED course with COPD exacerbation. PMH: afib; COPD; DM; HTN; HLD; and OSA on CPAP presenting with SOB.  Clinical Impression  Pt sitting at EOB on PT arrival. Pt with dizziness on performing transfers, returned to sitting with BP stable. Pt tolerates ambulation of household distances without AD and without physical assistance, experiencing 1 LOB and able to self-correct. Pt limited in distance ambulated this session due to fatigue and SOB, demonstrating increased WOB. Pt reports limitations in ambulation distances PTA due to SOB. Pt may benefit from ambulation trial with rollator at next session. Pt with deficits in balance and activity tolerance and will benefit from acute PT to improve energy conservation and dynamic gait activities. SPT recommends HHPT to assist with increased tolerance to activity and improved independence with mobility.     Follow Up Recommendations Home health PT    Equipment Recommendations  None recommended by PT    Recommendations for Other Services       Precautions / Restrictions Precautions Precautions: Fall Restrictions Weight Bearing Restrictions: No      Mobility  Bed Mobility               General bed mobility comments: sitting EOB on arrival    Transfers Overall transfer level: Needs assistance   Transfers: Sit to/from Stand (2x) Sit to Stand: Min guard         General transfer comment: min G for safety. Pt performs initaly sit>stand transfer with reports of dizziness, pt returned to sitting and BP taken- 151/64, HR- 77. Pt performs additional sit>stand transfer and no complaints of dizziness, BP in standing- 140/64, HR-  81.  Ambulation/Gait Ambulation/Gait assistance: Min guard Gait Distance (Feet): 220 Feet Assistive device: None Gait Pattern/deviations: Step-through pattern;Wide base of support Gait velocity: reduced Gait velocity interpretation: 1.31 - 2.62 ft/sec, indicative of limited community ambulator General Gait Details: Pt fatigues with ambulation of household distances, reporting SOB. o2 sats stable. Pt returns to room and experiences 1 LOB and self-corrects.  Stairs            Wheelchair Mobility    Modified Rankin (Stroke Patients Only)       Balance Overall balance assessment: Needs assistance Sitting-balance support: Feet supported Sitting balance-Leahy Scale: Fair     Standing balance support: During functional activity Standing balance-Leahy Scale: Fair Standing balance comment: Pt static standing without reliance of UE support or external support.                             Pertinent Vitals/Pain Pain Assessment: No/denies pain    Home Living Family/patient expects to be discharged to:: Private residence Living Arrangements: Spouse/significant other Available Help at Discharge: Family;Available 24 hours/day Type of Home: Apartment Home Access: Level entry     Home Layout: One level Home Equipment: Walker - 2 wheels;Cane - single point      Prior Function Level of Independence: Independent with assistive device(s)         Comments: Pt primarily ambulates without AD, uses SPC when he feels it necessary. Pt receives some assistance to get into/out of tub from fiance, but able to bathe self. Pt independent with dressing. Pt on  3 L [place] at baseline.     Hand Dominance        Extremity/Trunk Assessment   Upper Extremity Assessment Upper Extremity Assessment: Defer to OT evaluation    Lower Extremity Assessment Lower Extremity Assessment: Generalized weakness    Cervical / Trunk Assessment Cervical / Trunk Assessment: Normal   Communication   Communication: No difficulties  Cognition Arousal/Alertness: Awake/alert Behavior During Therapy: WFL for tasks assessed/performed Overall Cognitive Status: Within Functional Limits for tasks assessed                                        General Comments General comments (skin integrity, edema, etc.): VSS on 3 L    Exercises     Assessment/Plan    PT Assessment Patient needs continued PT services  PT Problem List Decreased strength;Decreased activity tolerance;Decreased balance;Decreased mobility;Decreased knowledge of precautions       PT Treatment Interventions DME instruction;Gait training;Functional mobility training;Therapeutic exercise;Therapeutic activities;Balance training;Patient/family education    PT Goals (Current goals can be found in the Care Plan section)  Acute Rehab PT Goals Patient Stated Goal: get better and go home PT Goal Formulation: With patient Time For Goal Achievement: 04/21/21 Potential to Achieve Goals: Good Additional Goals Additional Goal #1: Pt will ambulate > 125 ft with least restrictive device with RPE < 3/10 for improved activity tolerance    Frequency Min 3X/week   Barriers to discharge        Co-evaluation               AM-PAC PT "6 Clicks" Mobility  Outcome Measure Help needed turning from your back to your side while in a flat bed without using bedrails?: None Help needed moving from lying on your back to sitting on the side of a flat bed without using bedrails?: None Help needed moving to and from a bed to a chair (including a wheelchair)?: A Little Help needed standing up from a chair using your arms (e.g., wheelchair or bedside chair)?: A Little Help needed to walk in hospital room?: A Little Help needed climbing 3-5 steps with a railing? : A Little 6 Click Score: 20    End of Session Equipment Utilized During Treatment: Gait belt;Oxygen Activity Tolerance: Patient limited by  fatigue Patient left: in chair;with call bell/phone within reach;with chair alarm set;with family/visitor present Nurse Communication: Mobility status PT Visit Diagnosis: Unsteadiness on feet (R26.81);Muscle weakness (generalized) (M62.81);Other abnormalities of gait and mobility (R26.89)    Time: 7341-9379 PT Time Calculation (min) (ACUTE ONLY): 27 min   Charges:   PT Evaluation $PT Eval Low Complexity: 1 Low          Acute Rehab  Pager: [phone]   Waldemar Dickens, SPT  04/07/2021, 12:43 PM

## 2021-04-07 NOTE — Assessment & Plan Note (Signed)
--  Continue Abilify, Buspar, Prozac, Ativan

## 2021-04-07 NOTE — Assessment & Plan Note (Signed)
--  Wears nightly BIPAP but recently noncompliant --Home machine and/or settings needs to be adjusted per pt and wife --BIPAP nightly and PRN

## 2021-04-07 NOTE — Progress Notes (Signed)
Pt was very SOB on exertion after going to the [facility], once back in bed breathing tx given, pt put on bipap.  MD notified and ordered to d/c IV fluids.  Once bipap was on, pt was breathing a lot better, sats at 100%.  Will continue to monitor, Thanks Lavonda Jumbo RN

## 2021-04-07 NOTE — Assessment & Plan Note (Signed)
Per Dr. Ophelia Charter' H&P: "Patient appears to need significant ongoing education regarding end-stage COPD He is being treated with q2h prn Oxycodone and yet is also a full code We discussed long-term trach dependence and the low likelihood of someone with such severe lung disease ever coming off a ventilator Ongoing discussion is encouraged Patient is enrolled in hospice; needs code status discussion and family education if he transitions to DNR"

## 2021-04-07 NOTE — Assessment & Plan Note (Addendum)
--  Continue Imdur --Entresto, Aldactone, Lasix held on admission due to AKI. --Resume home Lasix 40 mg BID --Continue holding Entresto, Aldactone & resume if BP will tolerate

## 2021-04-08 DIAGNOSIS — J441 Chronic obstructive pulmonary disease with (acute) exacerbation: Secondary | ICD-10-CM | POA: Diagnosis not present

## 2021-04-08 LAB — CBC
HCT: 31.2 % — ABNORMAL LOW (ref 39.0–52.0)
Hemoglobin: 9.3 g/dL — ABNORMAL LOW (ref 13.0–17.0)
MCH: 26.6 pg (ref 26.0–34.0)
MCHC: 29.8 g/dL — ABNORMAL LOW (ref 30.0–36.0)
MCV: 89.1 fL (ref 80.0–100.0)
Platelets: 247 10*3/uL (ref 150–400)
RBC: 3.5 MIL/uL — ABNORMAL LOW (ref 4.22–5.81)
RDW: 15.9 % — ABNORMAL HIGH (ref 11.5–15.5)
WBC: 9.1 10*3/uL (ref 4.0–10.5)
nRBC: 0 % (ref 0.0–0.2)

## 2021-04-08 LAB — GLUCOSE, CAPILLARY
Glucose-Capillary: 330 mg/dL — ABNORMAL HIGH (ref 70–99)
Glucose-Capillary: 176 mg/dL — ABNORMAL HIGH (ref 70–99)
Glucose-Capillary: 357 mg/dL — ABNORMAL HIGH (ref 70–99)
Glucose-Capillary: 49 mg/dL — ABNORMAL LOW (ref 70–99)
Glucose-Capillary: 99 mg/dL (ref 70–99)

## 2021-04-08 LAB — BASIC METABOLIC PANEL
Anion gap: 7 (ref 5–15)
BUN: 16 mg/dL (ref 6–20)
CO2: 33 mmol/L — ABNORMAL HIGH (ref 22–32)
Calcium: 9.1 mg/dL (ref 8.9–10.3)
Chloride: 94 mmol/L — ABNORMAL LOW (ref 98–111)
Creatinine, Ser: 1.26 mg/dL — ABNORMAL HIGH (ref 0.61–1.24)
GFR, Estimated: >60 mL/min (ref >60)
Glucose, Bld: 319 mg/dL — ABNORMAL HIGH (ref 70–99)
Potassium: 4.8 mmol/L (ref 3.5–5.1)
Sodium: 134 mmol/L — ABNORMAL LOW (ref 135–145)

## 2021-04-08 LAB — HEMOGLOBIN A1C
Hgb A1c MFr Bld: 10.3 % — ABNORMAL HIGH (ref 4.8–5.6)
Mean Plasma Glucose: 248.91 mg/dL

## 2021-04-08 MED ORDER — FUROSEMIDE 40 MG PO TABS
40.0000 mg | ORAL_TABLET | Freq: Two times a day (BID) | ORAL | Status: DC
  Administered 2021-04-08 – 2021-04-09 (×2): 40 mg via ORAL
  Filled 2021-04-08 (×2): qty 1

## 2021-04-08 MED ORDER — METHYLPREDNISOLONE SODIUM SUCC 125 MG IJ SOLR
60.0000 mg | Freq: Two times a day (BID) | INTRAMUSCULAR | Status: DC
  Administered 2021-04-08: 60 mg via INTRAVENOUS
  Filled 2021-04-08: qty 2

## 2021-04-08 MED ORDER — AZITHROMYCIN 250 MG PO TABS
500.0000 mg | ORAL_TABLET | Freq: Every day | ORAL | Status: DC
  Administered 2021-04-08 – 2021-04-09 (×2): 500 mg via ORAL
  Filled 2021-04-08 (×2): qty 2

## 2021-04-08 MED ORDER — METHYLPREDNISOLONE SODIUM SUCC 125 MG IJ SOLR
60.0000 mg | INTRAMUSCULAR | Status: DC
  Filled 2021-04-08: qty 2

## 2021-04-08 MED ORDER — INSULIN ASPART 100 UNIT/ML IJ SOLN
0.0000 [IU] | Freq: Three times a day (TID) | INTRAMUSCULAR | Status: DC
  Administered 2021-04-08: 20 [IU] via SUBCUTANEOUS
  Administered 2021-04-09: 4 [IU] via SUBCUTANEOUS

## 2021-04-08 MED ORDER — INSULIN ASPART 100 UNIT/ML IJ SOLN
8.0000 [IU] | Freq: Once | INTRAMUSCULAR | Status: AC
  Administered 2021-04-08: 8 [IU] via SUBCUTANEOUS

## 2021-04-08 NOTE — Progress Notes (Signed)
MD, could u please put pt on insulin coverage, pt is on Solumedrol and his CBG's have been high, Thanks Glenna Fellows.

## 2021-04-08 NOTE — Progress Notes (Signed)
Physical Therapy Treatment Patient Details Name: Kenneth [name] MRN: [id number] DOB: December 16, 1960 Today's Date: 04/08/2021    History of Present Illness 60 y.o. male presenting to ED on 7/16 for SOB. Pt admitted to hospice in early June, remaining a full code. CXR not consistent with pneumonia. ED course with COPD exacerbation. PMH: afib; COPD; DM; HTN; HLD; and OSA on CPAP presenting with SOB.    PT Comments    Continuing work on functional mobility and activity tolerance;  Showing very good use of Rollator RW for progressive amb, and seems to like it; Walked entire hallway with Rollator with no difficulty and good self-monitor for activity tolerance; on track for dc home tomorrow    Follow Up Recommendations  No PT follow up     Equipment Recommendations  Other (comment) (Rollator RW)    Recommendations for Other Services       Precautions / Restrictions Precautions Precautions: Fall Precaution Comments: fall risk minimized with use of rW    Mobility  Bed Mobility               General bed mobility comments: sitting EOB on arrival    Transfers Overall transfer level: Needs assistance Equipment used: 4-wheeled walker Transfers: Sit to/from Stand Sit to Stand: Supervision         General transfer comment: After education on brake use, pt able to implement without cues when standing with Rollator  Ambulation/Gait Ambulation/Gait assistance: Min guard Gait Distance (Feet): 220 Feet Assistive device: 4-wheeled walker Gait Pattern/deviations: Step-through pattern     General Gait Details: Cues to self-monitro for activity tolerance   Stairs             Wheelchair Mobility    Modified Rankin (Stroke Patients Only)       Balance     Sitting balance-Leahy Scale: Good       Standing balance-Leahy Scale: Fair                              Cognition Arousal/Alertness: Awake/alert Behavior During Therapy: WFL for tasks  assessed/performed Overall Cognitive Status: Within Functional Limits for tasks assessed                                        Exercises      General Comments General comments (skin integrity, edema, etc.): VSS on 3 L O2      Pertinent Vitals/Pain Pain Assessment: No/denies pain    Home Living                      Prior Function            PT Goals (current goals can now be found in the care plan section) Acute Rehab PT Goals Patient Stated Goal: get better and go home, work more with Rollator PT Goal Formulation: With patient Time For Goal Achievement: 04/21/21 Potential to Achieve Goals: Good Progress towards PT goals: Progressing toward goals    Frequency    Min 3X/week      PT Plan Current plan remains appropriate;Discharge plan needs to be updated    Co-evaluation              AM-PAC PT "6 Clicks" Mobility   Outcome Measure  Help needed turning from your back to your side while in a flat bed  without using bedrails?: None Help needed moving from lying on your back to sitting on the side of a flat bed without using bedrails?: None Help needed moving to and from a bed to a chair (including a wheelchair)?: A Little Help needed standing up from a chair using your arms (e.g., wheelchair or bedside chair)?: A Little Help needed to walk in hospital room?: A Little Help needed climbing 3-5 steps with a railing? : A Little 6 Click Score: 20    End of Session Equipment Utilized During Treatment: Gait belt;Oxygen Activity Tolerance: Patient tolerated treatment well Patient left: in bed;with call bell/phone within reach;with family/visitor present Nurse Communication: Mobility status PT Visit Diagnosis: Unsteadiness on feet (R26.81);Muscle weakness (generalized) (M62.81);Other abnormalities of gait and mobility (R26.89)     Time: 1275-1700 PT Time Calculation (min) (ACUTE ONLY): 24 min  Charges:  $Gait Training: 23-37 mins                      Van Clines, [place]  Acute Rehabilitation Services Pager [phone] Office [phone]    Levi Aland 04/08/2021, 4:57 PM

## 2021-04-08 NOTE — Progress Notes (Signed)
Inpatient Diabetes Program Recommendations  AACE/ADA: New Consensus Statement on Inpatient Glycemic Control (2015)  Target Ranges:  Prepandial:   less than 140 mg/dL      Peak postprandial:   less than 180 mg/dL (1-2 hours)      Critically ill patients:  140 - 180 mg/dL   Results for PRIMITIVO, MERKEY (MRN [id number]) as of 04/08/2021 10:47  Ref. Range 04/07/2021 05:45 04/07/2021 22:07  Glucose-Capillary Latest Ref Range: 70 - 99 mg/dL 163 (H) 846 (H)  Results for CLEOFAS, HUDGINS (MRN [id number]) as of 04/08/2021 10:47  Ref. Range 04/08/2021 06:07  Glucose-Capillary Latest Ref Range: 70 - 99 mg/dL 779 (H)  8 units NOVOLOG    Results for SEAN, MALINOWSKI (MRN [id number]) as of 04/08/2021 10:47  Ref. Range 09/13/2020 04:08 12/23/2020 23:22 04/08/2021 06:23  Hemoglobin A1C Latest Ref Range: 4.8 - 5.6 % 6.3 (H) 14.4 (H) 10.3 (H)  (248 mg/dl)    Admit with: Acute on chronic respiratory failure associated with a COPD exacerbation  History: DM, Hospice at Home (remains full code), CHF, COPD  Home DM Meds: Lantus 45 units QHS       Metformin 500 mg BID       Decadron and Prednisone also listed in Home Meds  Current Orders: Lantus 10 units Daily     Lantus 20 units QHS        Getting Solumedrol 60 mg BID    MD- Note patient only has orders for Lantus at present.  Also getting IV Solumedrol.  Please consider the following:  1. Start Novolog Resistant Correction Scale/ SSI (0-20 units) TID AC + HS  2. Increase AM Lantus to 20 units Daily Since 10 unit AM dose already given, please consider ordering Lantus 10 units X 1 dose to be given this AM as well     --Will follow patient during hospitalization--  Ambrose Finland RN, MSN, CDE Diabetes Coordinator Inpatient Glycemic Control Team Team Pager: [phone] (8a-5p)

## 2021-04-08 NOTE — Progress Notes (Signed)
Pt placed on BIPAP for night time rest.  

## 2021-04-08 NOTE — Consult Note (Signed)
   [facility] CM Inpatient Consult   04/08/2021  Kenneth [name] July 07, 1961 [id number]  [facility] [[facility]]  Accountable Care Organization [ACO] Patient: [name], currently showing in EMR as Hospice  Patient was screened for extreme high risk score for unplanned readmission noted to be a patient in an [facility] [[facility]]  practice with Chronic Care Management as active with services. Brief review of admission and Hospice Nurse's note.  Plan: Notification sent to the [facility] Embedded Care Management team and make aware of hospital admission.  Patient to be managed by hospice with support of Embedded CCM noted.  Please contact for further questions,  [name], RN BSN CCM [facility]  [phone] business mobile phone Toll free office [phone]  Fax number: [phone] [name]

## 2021-04-08 NOTE — Progress Notes (Signed)
Bipap removed as patient would like to take it off to eat snack at this time.

## 2021-04-08 NOTE — Progress Notes (Signed)
   Mr. On is an active pt with [facility]. He was admitted to hospice services in 02-20-21 for ES COPD.  The pt does not have a Bi-pap at home. He has a CPAP. I have been able to speak with him about his machine at home and why he has been non-complaint with it. He does not like the way it fits and feels he is more comfortable with the machine in hospital which is a bi-pap. I have spoke to the medical Directory at [facility] [facility] [place] and she will approve a bi-pap for the pt at home but does not feel a trilogy is needed and this would not be covered under his hospice benefit until he has failed the trial with bi-pap. The pt is in agreement with using a bi-pap at home and I have obtained the current settings from RT and will order this to be delivered to home for his use.   Please reach out if you have questions Norm Parcel RN BSN [facility]  [phone]

## 2021-04-08 NOTE — Progress Notes (Addendum)
PROGRESS NOTE    Kenneth [name]   BPZ:[id number]  DOB: 26-Feb-1961  PCP: [name], MD    DOA: 04/06/2021 LOS: 2   Assessment & Plan   Principal Problem:   COPD exacerbation (HCC) Active Problems:   Goals of care, counseling/discussion   Hypertension associated with diabetes (HCC)   Paroxysmal atrial fibrillation (HCC)   Chronic combined systolic and diastolic congestive heart failure (HCC)   Type 2 diabetes mellitus with stage 3a chronic kidney disease, with long-term current use of insulin (HCC)   Acute kidney injury superimposed on CKD (HCC)   Mixed anxiety and depressive disorder   Tobacco abuse   Hyperlipidemia   Obstructive sleep apnea   COPD exacerbation (HCC) End stage COPD on hospice, p/w severe respiratory distress, required bag respirations during transport. Suspect due to non-compliance with BiPAP at home. Chronic Hypoxia on O2 at home.  Required NRB mask and BiPAP on arrival. Follow with pulmonology - notified of admission.  No acute needs to warrant consult at this time.  Chest Xray without PNA. --Nebulizers: scheduled Duoneb and prn albuterol --Reduce Solu-Medrol to 60 mg q24h --(on chronic prednisone 10 mg daily at home) --Zithromax x 5 days --Continue formulary-equivalent for Trelegy --Continue Daliresp --Coordinated care with [facility] team/PT/OT/Nutrition/RT consults --Ongoing education regarding the severity of his underlying illness   Paroxysmal atrial fibrillation (HCC) --Rate controlled with Coreg --Continue Eliquis  Chronic combined systolic and diastolic congestive heart failure (HCC) --Continue Imdur --Entresto, Aldactone, Lasix held on admission due to AKI. --Resume home Lasix 40 mg BID --Continue holding Entresto, Aldactone & resume if BP will tolerate   Type 2 diabetes mellitus with stage 3a chronic kidney disease, with long-term current use of insulin (HCC) Recent (12/23/20) A1c was 14.4%, very poorly controlled --Continue  Lantus --Novolog moderate SSI   Hypertension associated with diabetes (HCC) --Continue Norvasc, Coreg, hydralazine  Mixed anxiety and depressive disorder --Continue Abilify, Buspar, Prozac, Ativan  Hyperlipidemia --Continue Crestor  Acute kidney injury superimposed on CKD (HCC) --Currently with mild AKI - resolved.   --Resume Lasix, continue holding Aldactone and Entresto for now --Hold other nephrotoxic medications & renally dose meds --Monitor BMP  Obstructive sleep apnea --Wears nightly BIPAP but recently noncompliant --Home machine and/or settings needs to be adjusted per pt and wife --BIPAP nightly and PRN  Tobacco abuse Despite end-stage COPD and continuous O2 dependence, he is continuing to smoke --Cessation encouraged --Nicotine patch ordered  Goals of care, counseling/discussion Per Dr. Ophelia Charter' H&P: "Patient appears to need significant ongoing education regarding end-stage COPD He is being treated with q2h prn Oxycodone and yet is also a full code We discussed long-term trach dependence and the low likelihood of someone with such severe lung disease ever coming off a ventilator Ongoing discussion is encouraged Patient is enrolled in hospice; needs code status discussion and family education if he transitions to DNR"   Patient BMI: Body mass index is 26.7 kg/m.   DVT prophylaxis:  apixaban (ELIQUIS) tablet 5 mg   Diet:  Diet Orders (From admission, onward)     Start     Ordered   04/06/21 1130  Diet heart healthy/carb modified Room service appropriate? Yes; Fluid consistency: Thin  Diet effective now       Question Answer Comment  Diet-HS Snack? Nothing   Room service appropriate? Yes   Fluid consistency: Thin      04/06/21 1134              Code Status: Full Code  Brief Narrative / Hospital Course to Date:   60 y.o. male with medical history significant of afib; end stage COPD on home o2 followed by hospice; DM; HTN; HLD; and OSA on CPAP  presenting with severe SOB.    Admitted with acute exacerbation on advanced COPD.  Subjective 04/08/21    Pt reports feeling better today.  Denies SOB at rest.  Confirmed use of 3 L/min oxygen at home, on that now.  No fever/chills, not much of a cough.  Having some stomach upset may be indigestion. Fiance at bedside.   Disposition Plan & Communication   Status is: Inpatient  Remains inpatient appropriate because:Inpatient level of care appropriate due to severity of illness  Dispo: The patient is from: Home              Anticipated d/c is to: Home              Patient currently is not medically stable to d/c.   Difficult to place patient No  Family communication - fiance at bedside on rounds today 7/18  Consults, Procedures, Significant Events   Consultants:  none  Procedures:  none  Antimicrobials:  Anti-infectives (From admission, onward)    Start     Dose/Rate Route Frequency Ordered Stop   04/08/21 1200  azithromycin (ZITHROMAX) tablet 500 mg        500 mg Oral Daily 04/08/21 0726     04/06/21 1200  ceFEPIme (MAXIPIME) 2 g in sodium chloride 0.9 % 100 mL IVPB  Status:  Discontinued        2 g 200 mL/hr over 30 Minutes Intravenous Every 8 hours 04/06/21 1134 04/07/21 0929   04/06/21 1045  azithromycin (ZITHROMAX) 500 mg in sodium chloride 0.9 % 250 mL IVPB        500 mg 250 mL/hr over 60 Minutes Intravenous  Once 04/06/21 1040 04/06/21 1608         Micro    Objective   Vitals:   04/08/21 0246 04/08/21 0401 04/08/21 0744 04/08/21 1023  BP:  (!) 147/78  (!) 142/77  Pulse:  84  75  Resp:  16  17  Temp:  98.3 F (36.8 C)  97.7 F (36.5 C)  TempSrc:  Oral  Oral  SpO2: 99% 100% 100% 100%  Weight:  89.3 kg    Height:        Intake/Output Summary (Last 24 hours) at 04/08/2021 1603 Last data filed at 04/08/2021 1330 Gross per 24 hour  Intake 580 ml  Output 151 ml  Net 429 ml   Filed Weights   04/06/21 1900 04/07/21 0258 04/08/21 0401  Weight: 90.3  kg 89.2 kg 89.3 kg    Physical Exam:  General exam: awake, alert, no acute distress, sitting edge of bed HEENT: moist mucus membranes, hearing grossly normal  Respiratory system: poor aeration mildly improved, no wheezes, rales or rhonchi, normal respiratory effort at rest, on 3 L/min [place] O2.  No conversational dyspnea or accessory  muscle use Cardiovascular system: normal S1/S2, RRR, no pedal edema.   Central nervous system: A&O x3. no gross focal neurologic deficits, normal speech Extremities: moves all , no edema, normal tone Psychiatry: normal mood, congruent affect, judgement and insight appear normal  Labs   Data Reviewed: I have personally reviewed following labs and imaging studies  CBC: Recent Labs  Lab 04/06/21 0725 04/07/21 0155 04/08/21 0623  WBC 14.4* 10.4 9.1  NEUTROABS 10.3*  --   --   HGB  9.7* 9.1* 9.3*  HCT 33.8* 30.8* 31.2*  MCV 91.8 89.0 89.1  PLT 263 209 247   Basic Metabolic Panel: Recent Labs  Lab 04/06/21 0725 04/07/21 0155 04/08/21 0623  NA 134* 132* 134*  K 3.8 4.3 4.8  CL 88* 89* 94*  CO2 39* 38* 33*  GLUCOSE 310* 210* 319*  BUN 13 14 16   CREATININE 1.71* 1.28* 1.26*  CALCIUM 9.0 9.1 9.1   GFR: Estimated Creatinine Clearance: 69.3 mL/min (A) (by C-G formula based on SCr of 1.26 mg/dL (H)). Liver Function Tests: Recent Labs  Lab 04/06/21 0725  AST 24  ALT 27  ALKPHOS 69  BILITOT 0.6  PROT 6.3*  ALBUMIN 3.6   No results for input(s): LIPASE, AMYLASE in the last 168 hours. No results for input(s): AMMONIA in the last 168 hours. Coagulation Profile: No results for input(s): INR, PROTIME in the last 168 hours. Cardiac Enzymes: No results for input(s): CKTOTAL, CKMB, CKMBINDEX, TROPONINI in the last 168 hours. BNP (last 3 results) No results for input(s): PROBNP in the last 8760 hours. HbA1C: Recent Labs    04/08/21 0623  HGBA1C 10.3*   CBG: Recent Labs  Lab 04/06/21 2122 04/07/21 0545 04/07/21 2207 04/08/21 0607  04/08/21 1139  GLUCAP 267* 269* 197* 330* 176*   Lipid Profile: No results for input(s): CHOL, HDL, LDLCALC, TRIG, CHOLHDL, LDLDIRECT in the last 72 hours. Thyroid Function Tests: No results for input(s): TSH, T4TOTAL, FREET4, T3FREE, THYROIDAB in the last 72 hours. Anemia Panel: No results for input(s): VITAMINB12, FOLATE, FERRITIN, TIBC, IRON, RETICCTPCT in the last 72 hours. Sepsis Labs: Recent Labs  Lab 04/07/21 0155  PROCALCITON 0.51    Recent Results (from the past 240 hour(s))  SARS CORONAVIRUS 2 (TAT 6-24 HRS) Nasopharyngeal Nasopharyngeal Swab     Status: None   Collection Time: 04/06/21  7:28 AM   Specimen: Nasopharyngeal Swab  Result Value Ref Range Status   SARS Coronavirus 2 NEGATIVE NEGATIVE Final    Comment: (NOTE) SARS-CoV-2 target nucleic acids are NOT DETECTED.  The SARS-CoV-2 RNA is generally detectable in upper and lower respiratory specimens during the acute phase of infection. Negative results do not preclude SARS-CoV-2 infection, do not rule out co-infections with other pathogens, and should not be used as the sole basis for treatment or other patient management decisions. Negative results must be combined with clinical observations, patient history, and epidemiological information. The expected result is Negative.  Fact Sheet for Patients: 04/08/21  Fact Sheet for Healthcare Providers: HairSlick.no  This test is not yet approved or cleared by the quierodirigir.com FDA and  has been authorized for detection and/or diagnosis of SARS-CoV-2 by FDA under an Emergency Use Authorization (EUA). This EUA will remain  in effect (meaning this test can be used) for the duration of the COVID-19 declaration under Se ction 564(b)(1) of the Act, 21 U.S.C. section 360bbb-3(b)(1), unless the authorization is terminated or revoked sooner.  Performed at [facility], [address], [place],  [place]       Imaging Studies   DG CHEST PORT 1 VIEW  Result Date: 04/06/2021 CLINICAL DATA:  Shortness of breath EXAM: PORTABLE CHEST 1 VIEW COMPARISON:  04/06/2021, CT 10/03/2020 FINDINGS: Hyperinflation with emphysema. No focal opacity or pleural effusion. Normal cardiomediastinal silhouette with aortic atherosclerosis. No pneumothorax. IMPRESSION: No active disease.  Emphysema Electronically Signed   By: 12/01/2020 M.D.   On: 04/06/2021 20:16     Medications   Scheduled Meds:  amLODipine  10 mg Oral Daily   apixaban  5 mg Oral BID   ARIPiprazole  7.5 mg Oral Daily   azithromycin  500 mg Oral Daily   busPIRone  15 mg Oral TID   carvedilol  12.5 mg Oral BID WC   docusate sodium  100 mg Oral BID   famotidine  20 mg Oral Daily   FLUoxetine  60 mg Oral Daily   fluticasone furoate-vilanterol  1 puff Inhalation Daily   furosemide  40 mg Oral BID   hydrALAZINE  25 mg Oral TID   insulin aspart  0-20 Units Subcutaneous TID WC   insulin glargine  10 Units Subcutaneous Daily   insulin glargine  20 Units Subcutaneous QHS   ipratropium-albuterol  3 mL Nebulization Q6H   isosorbide mononitrate  30 mg Oral Daily   lubiprostone  8 mcg Oral BID WC   [START ON 04/09/2021] methylPREDNISolone (SOLU-MEDROL) injection  60 mg Intravenous Q24H   pantoprazole  40 mg Oral q morning   roflumilast  500 mcg Oral Daily   rosuvastatin  20 mg Oral QHS   sodium chloride flush  3 mL Intravenous Q12H   umeclidinium bromide  1 puff Inhalation Daily   Continuous Infusions:     LOS: 2 days    Time spent: 30 minutes    Pennie Banter, DO [facility]  04/08/2021, 4:03 PM      If 7PM-7AM, please contact night-coverage. How to contact the [facility] Attending or Consulting provider 7A - 7P or covering provider during after hours 7P -7A, for this patient?    Check the care team in [facility] and look for a) attending/consulting TRH provider listed and b) the [facility] team listed Log into  [email or web address] and use [place]'s universal password to access. If you do not have the password, please contact the hospital operator. Locate the [facility] provider you are looking for under [facility] and page to a number that you can be directly reached. If you still have difficulty reaching the provider, please page the [facility] (Director on Call) for the Hospitalists listed on amion for assistance.

## 2021-04-08 NOTE — Progress Notes (Addendum)
Nutrition Brief Note  RD consulted for assessment of nutrition requirements/ status.   Wt Readings from Last 15 Encounters:  04/08/21 89.3 kg  03/21/21 93.2 kg  02/21/21 93.1 kg  02/19/21 91.1 kg  02/08/21 93.6 kg  02/07/21 93.9 kg  01/29/21 91.8 kg  01/24/21 89.4 kg  01/18/21 90.3 kg  12/31/20 90.3 kg  12/25/20 88.1 kg  12/19/20 88.1 kg  12/07/20 85.7 kg  12/05/20 85.3 kg  11/22/20 81.3 kg   Raahim Shartzer is a 60 y.o. male with medical history significant of afib; COPD; DM; HTN; HLD; and OSA on CPAP presenting with SOB.  Pt admitted with COPD exacerbation.  Reviewed I/O's: +69 ml x 24 hours and +477 ml since admission  UOP: 150 ml x 24 hours  Pt is active with hospice services PTA. Per hospice notes, plan to transition home with Bipap.   Reviewed wt hx; wt has been stable over the past 3 months.   Lab Results  Component Value Date   HGBA1C 10.3 (H) 04/08/2021   PTA DM medications are 15 units insulin glargine daily and 500 mg metformin daily.   Labs reviewed: Na: 134, CBGS: 176-330   (inpatient orders for glycemic control are 0-20 units insulin aspart TID with meals, 10 units insulin glargine daily, and 20 units insulin glargine daily).    Current diet order is heart healthy/ carb modified, patient is consuming approximately 100% of meals at this time. Labs and medications reviewed.   No nutrition interventions warranted at this time. If nutrition issues arise, please consult RD.   Levada Schilling, RD, LDN, CDCES Registered Dietitian II Certified Diabetes Care and Education Specialist Please refer to [facility] for RD and/or RD on-call/weekend/after hours pager

## 2021-04-08 NOTE — Evaluation (Signed)
Occupational Therapy Evaluation Patient Details Name: Kenneth [name] MRN: [id number] DOB: 09/02/1961 Today's Date: 04/08/2021    History of Present Illness 60 y.o. male presenting to ED on 7/16 for SOB. Pt admitted to hospice in early June, remaining a full code. CXR not consistent with pneumonia. ED course with COPD exacerbation. PMH: afib; COPD; DM; HTN; HLD; and OSA on CPAP presenting with SOB.   Clinical Impression   PTA, pt lives with significant other and reports typically Independent with ADLs (has significant other nearby with showering tasks) and mobility. Pt wears 3 L O2 at baseline. Pt overall Independent with UB ADLs and Supervision for LB ADLs. Guided pt in Rollator use with good carryover of safety techniques and interested in Rollator for community mobility. Collaborated on energy conservation strategies for ADLs, IADLs and mobility with handout provided. Pt verbalized understanding of education and motivated to progress endurance and Rollator use. Anticipate no OT services needed at DC but will continue to follow acutely. VSS on 3 L O2.     Follow Up Recommendations  No OT follow up;Supervision - Intermittent    Equipment Recommendations  Other (comment) (Rollator)    Recommendations for Other Services       Precautions / Restrictions Precautions Precautions: Fall Restrictions Weight Bearing Restrictions: No      Mobility Bed Mobility               General bed mobility comments: sitting EOB on arrival    Transfers Overall transfer level: Needs assistance Equipment used: 4-wheeled walker Transfers: Sit to/from Stand Sit to Stand: Supervision         General transfer comment: After education on brake use, pt able to implement without cues when standing with Rollator    Balance Overall balance assessment: Needs assistance Sitting-balance support: Feet supported Sitting balance-Leahy Scale: Good     Standing balance support: During functional  activity Standing balance-Leahy Scale: Fair                             ADL either performed or assessed with clinical judgement   ADL Overall ADL's : Needs assistance/impaired Eating/Feeding: Independent;Sitting Eating/Feeding Details (indicate cue type and reason): left eating breakfast sitting EOB Grooming: Set up;Standing   Upper Body Bathing: Independent;Sitting   Lower Body Bathing: Supervison/ safety;Sit to/from stand   Upper Body Dressing : Independent;Sitting   Lower Body Dressing: Supervision/safety;Sit to/from stand   Toilet Transfer: Supervision/safety;Ambulation   Toileting- Clothing Manipulation and Hygiene: Supervision/safety;Sit to/from stand       Functional mobility during ADLs: Supervision/safety Aeronautical engineer) General ADL Comments: Collaborated with pt on energy conservation strategies for ADLs (handout provided) with pt demo good understanding of strategies. Educated on Rollator use with short distance mobility in room, good understanding of locking brakes, safety when using seat for rest breaks, etc.     Vision Baseline Vision/History: Wears glasses Wears Glasses: At all times Patient Visual Report: No change from baseline Vision Assessment?: No apparent visual deficits     Perception     Praxis      Pertinent Vitals/Pain Pain Assessment: No/denies pain     Hand Dominance Right   Extremity/Trunk Assessment Upper Extremity Assessment Upper Extremity Assessment: Overall WFL for tasks assessed   Lower Extremity Assessment Lower Extremity Assessment: Defer to PT evaluation   Cervical / Trunk Assessment Cervical / Trunk Assessment: Normal   Communication Communication Communication: No difficulties   Cognition Arousal/Alertness: Awake/alert Behavior During Therapy: [facility]  for tasks assessed/performed Overall Cognitive Status: Within Functional Limits for tasks assessed                                     General  Comments  VSS on 3 L O2    Exercises     Shoulder Instructions      Home Living Family/patient expects to be discharged to:: Private residence Living Arrangements: Spouse/significant other Available Help at Discharge: Family;Available 24 hours/day Type of Home: Apartment Home Access: Level entry     Home Layout: One level     Bathroom Shower/Tub: Chief Strategy Officer: Handicapped height (BSC over toilet)     Home Equipment: Walker - 2 wheels;Cane - single point;Shower seat;Bedside commode   Additional Comments: Pt lives with girlfriend who is also disabled. Pt wears 3L O2 [place]      Prior Functioning/Environment Level of Independence: Independent with assistive device(s)        Comments: Pt primarily ambulates without AD, uses SPC when he feels it necessary. Pt receives some assistance to get into/out of tub from fiance, but able to bathe self. Pt independent with dressing. Shares IADLs with significant other, still grocery shops. Pt on 3 L [place] at baseline.        OT Problem List: Decreased activity tolerance;Cardiopulmonary status limiting activity;Decreased knowledge of use of DME or AE;Impaired balance (sitting and/or standing)      OT Treatment/Interventions: Self-care/ADL training;Therapeutic exercise;Energy conservation;DME and/or AE instruction;Therapeutic activities;Balance training;Patient/family education    OT Goals(Current goals can be found in the care plan section) Acute Rehab OT Goals Patient Stated Goal: get better and go home, work more with Rollator OT Goal Formulation: With patient Time For Goal Achievement: 04/22/21 Potential to Achieve Goals: Good  OT Frequency: Min 2X/week   Barriers to D/C:            Co-evaluation              AM-PAC OT "6 Clicks" Daily Activity     Outcome Measure Help from another person eating meals?: None Help from another person taking care of personal grooming?: A Little Help from another person  toileting, which includes using toliet, bedpan, or urinal?: A Little Help from another person bathing (including washing, rinsing, drying)?: A Little Help from another person to put on and taking off regular upper body clothing?: None Help from another person to put on and taking off regular lower body clothing?: A Little 6 Click Score: 20   End of Session Equipment Utilized During Treatment: Other (comment);Oxygen (Rollator)  Activity Tolerance: Patient tolerated treatment well Patient left: in bed;with call bell/phone within reach (sitting EOB with breakfast)  OT Visit Diagnosis: Other (comment) (decreased cardiopulmonary tolerance)                Time: 0710-0726 OT Time Calculation (min): 16 min Charges:  OT General Charges $OT Visit: 1 Visit OT Evaluation $OT Eval Low Complexity: 1 Low  Bradd Canary, OTR/L Acute Rehab Services Office: [phone]   Lorre Munroe 04/08/2021, 7:53 AM

## 2021-04-08 NOTE — TOC Progression Note (Signed)
Transition of Care [facility]) - Progression Note    Patient Details  Name: Kenneth [name] MRN: [id number] Date of Birth: [birth year]  [facility] CM/SW Contact  Leone Haven, RN Phone Number: 04/08/2021, 12:07 PM  Clinical Narrative:    Cheri with [facility] states she is ordering a bipap for patient to go home with at discharge.        Expected Discharge Plan and Services                                                 Social Determinants of Health (SDOH) Interventions    Readmission Risk Interventions No flowsheet data found.

## 2021-04-08 NOTE — Progress Notes (Signed)
Hypoglycemic Event  CBG: 49  Treatment: 8 oz juice/soda  Symptoms: None  Follow-up CBG: Time: 2146 CBG Result: 99  Possible Reasons for Event: Unknown  Comments/MD notified: Patient is asymptomatic. Patient states he has ate all of his meals and does not feel like he lost any appetite. NT gave 8 oz orange juice and 1 pack of graham crackers for the patient to eat. Will monitor for any changes.    Kenneth [name]

## 2021-04-09 ENCOUNTER — Telehealth: Payer: Medicare Other

## 2021-04-09 ENCOUNTER — Telehealth: Payer: Self-pay | Admitting: *Deleted

## 2021-04-09 LAB — GLUCOSE, CAPILLARY
Glucose-Capillary: 161 mg/dL — ABNORMAL HIGH (ref 70–99)
Glucose-Capillary: 100 mg/dL — ABNORMAL HIGH (ref 70–99)
Glucose-Capillary: 60 mg/dL — ABNORMAL LOW (ref 70–99)
Glucose-Capillary: 144 mg/dL — ABNORMAL HIGH (ref 70–99)

## 2021-04-09 LAB — BASIC METABOLIC PANEL
Anion gap: 8 (ref 5–15)
BUN: 18 mg/dL (ref 6–20)
CO2: 33 mmol/L — ABNORMAL HIGH (ref 22–32)
Calcium: 8.9 mg/dL (ref 8.9–10.3)
Chloride: 93 mmol/L — ABNORMAL LOW (ref 98–111)
Creatinine, Ser: 1.14 mg/dL (ref 0.61–1.24)
GFR, Estimated: >60 mL/min (ref >60)
Glucose, Bld: 264 mg/dL — ABNORMAL HIGH (ref 70–99)
Potassium: 3.9 mmol/L (ref 3.5–5.1)
Sodium: 134 mmol/L — ABNORMAL LOW (ref 135–145)

## 2021-04-09 LAB — MAGNESIUM: Magnesium: 2.1 mg/dL (ref 1.7–2.4)

## 2021-04-09 MED ORDER — PREDNISONE 20 MG PO TABS
40.0000 mg | ORAL_TABLET | Freq: Every day | ORAL | Status: DC
  Administered 2021-04-09: 40 mg via ORAL
  Filled 2021-04-09: qty 2

## 2021-04-09 MED ORDER — ORAL CARE MOUTH RINSE
15.0000 mL | Freq: Two times a day (BID) | OROMUCOSAL | Status: DC
  Administered 2021-04-09: 15 mL via OROMUCOSAL

## 2021-04-09 MED ORDER — SPIRONOLACTONE 25 MG PO TABS
25.0000 mg | ORAL_TABLET | Freq: Every day | ORAL | Status: DC
  Administered 2021-04-09: 25 mg via ORAL
  Filled 2021-04-09: qty 1

## 2021-04-09 MED ORDER — INSULIN ASPART 100 UNIT/ML IJ SOLN: 0.0000 [IU] | Freq: Three times a day (TID) | INTRAMUSCULAR | Status: DC

## 2021-04-09 MED ORDER — SACUBITRIL-VALSARTAN 24-26 MG PO TABS
1.0000 | ORAL_TABLET | Freq: Two times a day (BID) | ORAL | Status: DC
  Administered 2021-04-09: 1 via ORAL
  Filled 2021-04-09: qty 1

## 2021-04-09 MED ORDER — CHLORHEXIDINE GLUCONATE 0.12 % MT SOLN
15.0000 mL | Freq: Two times a day (BID) | OROMUCOSAL | Status: DC
  Administered 2021-04-09 (×2): 15 mL via OROMUCOSAL
  Filled 2021-04-09 (×2): qty 15

## 2021-04-09 MED ORDER — PREDNISONE 20 MG PO TABS: ORAL_TABLET | ORAL | 0 refills | Status: DC

## 2021-04-09 NOTE — Progress Notes (Signed)
Hypoglycemic Event  CBG: 60  Treatment: 8 oz juice/soda  Symptoms: Shaky  Follow-up CBG: Time:1020 CBG Result:100  Possible Reasons for Event: Unknown  Comments/MD notified:yes    Kenneth [name]

## 2021-04-09 NOTE — Discharge Summary (Signed)
Physician Discharge Summary  Kenneth [name] FUX:[id number] DOB: October 21, 1960 DOA: 04/06/2021  PCP: [name], MD  Admit date: 04/06/2021 Discharge date: 04/09/2021  Admitted From: home Disposition:  home  Recommendations for Outpatient Follow-up:  Follow up with PCP in 1-2 weeks Please obtain BMP/CBC in one week Please follow up with [place]: No  Equipment/Devices: None - Hospice agency to deliver BiPAP machine to patient's home today.   Discharge Condition: Stable  CODE STATUS: Full  Diet recommendation:  Heart Healthy / Carb Modified     Discharge Diagnoses: Principal Problem:   COPD exacerbation ([place]) Active Problems:   Goals of care, counseling/discussion   Hypertension associated with diabetes ([place])   Paroxysmal atrial fibrillation (HCC)   Chronic combined systolic and diastolic congestive heart failure (HCC)   Type 2 diabetes mellitus with stage 3a chronic kidney disease, with long-term current use of insulin (HCC)   Acute kidney injury superimposed on CKD (HCC)   Mixed anxiety and depressive disorder   Tobacco abuse   Hyperlipidemia   Obstructive sleep apnea    Summary of HPI and Hospital Course:  60 y.o. male with medical history significant of afib; end stage COPD on home o2 followed by hospice; DM; HTN; HLD; and OSA on CPAP presenting with severe SOB.     Admitted with acute exacerbation on advanced COPD.  COPD exacerbation (HCC) End stage COPD on hospice, p/w severe respiratory distress, required bag respirations during transport. Suspect due to non-compliance with BiPAP at home. Chronic Hypoxia on O2 at home.  Required NRB mask and BiPAP on arrival. Follow with pulmonology - notified of admission.  No acute needs to warrant consult at this time. Chest Xray without PNA. --Nebulizers: scheduled Duoneb and prn albuterol --Treated with IV Solu-Medrol  --Discharged on prednisone taper, to continue on his chronic dose of 10 mg  daily after taper --Zithromax x 5 days --Continue formulary-equivalent for Trelegy --Continue Daliresp --Coordinated care with TOC team/PT/OT/Nutrition/RT consults --Ongoing education regarding the severity of his underlying illness     Paroxysmal atrial fibrillation (HCC) --Rate controlled with Coreg --Continue Eliquis   Chronic combined systolic and diastolic congestive heart failure (HCC) --Continue Imdur --Entresto, Aldactone, Lasix held on admission due to AKI. --Resume home Lasix 40 mg BID --Continue holding Entresto, Aldactone & resume if BP will tolerate     Type 2 diabetes mellitus with stage 3a chronic kidney disease, with long-term current use of insulin (HCC) Recent (12/23/20) A1c was 14.4%, very poorly controlled --Continue Lantus --Covered with Novolog moderate SSI  --Resume prior home regimen on d/c   Hypertension associated with diabetes ([place]) --Continue Norvasc, Coreg, hydralazine   Mixed anxiety and depressive disorder --Continue Abilify, Buspar, Prozac, Ativan   Hyperlipidemia --Continue Crestor   Acute kidney injury superimposed on CKD ([place]) mild AKI - resolved.   --Resumed Lasix --resume Aldactone and Entresto on d/c --Monitor BMP   Obstructive sleep apnea --Wears nightly BIPAP but recently noncompliant --Home machine to be delivered to patient by hospice. --BIPAP nightly and PRN   Tobacco abuse Despite end-stage COPD and continuous O2 dependence, he is continuing to smoke --Cessation encouraged --Nicotine patch ordered   Goals of care, counseling/discussion Patient is enrolled in hospice, follows with [facility] Remains full code status.  Continue GOC and code status discussions.       Discharge Instructions   Discharge Instructions     Call MD for:  extreme fatigue   Complete by: As directed    Call  MD for:  persistant dizziness or light-headedness   Complete by: As directed    Call MD for:  persistant nausea and vomiting    Complete by: As directed    Call MD for:  severe uncontrolled pain   Complete by: As directed    Call MD for:  temperature >100.4   Complete by: As directed    Diet - low sodium heart healthy   Complete by: As directed    Discharge instructions   Complete by: As directed    Please use your BiPAP machine anytime that you are sleeping, or if you feel like you are working hard to breath or cannot catch your breath.  I sent a prednisone taper to your pharmacy.  Once taper is done, continue on your usual 10 mg prednisone dose daily.  Follow closely with your hospice team to help manage your symptoms.   Increase activity slowly   Complete by: As directed       Allergies as of 04/09/2021       Reactions   Lisinopril Swelling   Other Other (See Comments)   Lettuce : rash   Tomato Rash        Medication List     TAKE these medications    acetaminophen 325 MG tablet Commonly known as: TYLENOL Take 650 mg by mouth every 6 (six) hours as needed for mild pain or headache.   albuterol (2.5 MG/3ML) 0.083% nebulizer solution Commonly known as: PROVENTIL Take 3 mLs (2.5 mg total) by nebulization every 6 (six) hours as needed for shortness of breath.   albuterol 108 (90 Base) MCG/ACT inhaler Commonly known as: VENTOLIN HFA Inhale 2 puffs into the lungs every 4 (four) hours as needed for wheezing or shortness of breath.   amLODipine 10 MG tablet Commonly known as: NORVASC Take 10 mg by mouth daily.   ARIPiprazole 5 MG tablet Commonly known as: ABILIFY Take 7.5 mg by mouth daily.   busPIRone 15 MG tablet Commonly known as: BUSPAR Take 1 tablet (15 mg total) by mouth 2 (two) times daily. What changed: when to take this   carvedilol 12.5 MG tablet Commonly known as: COREG Take 12.5 mg by mouth 2 (two) times daily.   Daliresp 500 MCG Tabs tablet Generic drug: roflumilast Take 1 tablet (500 mcg total) by mouth daily.   dexamethasone 4 MG tablet Commonly known as:  DECADRON Take 4 mg by mouth daily as needed (sob).   Eliquis 5 MG Tabs tablet Generic drug: apixaban TAKE ONE TABLET BY MOUTH EVERY MORNING and TAKE ONE TABLET BY MOUTH EVERY EVENING What changed: See the new instructions.   famotidine 20 MG tablet Commonly known as: PEPCID Take 20 mg by mouth daily.   FLUoxetine 20 MG capsule Commonly known as: PROZAC Take 60 mg by mouth daily.   furosemide 40 MG tablet Commonly known as: LASIX Take 1 tablet (40 mg total) by mouth 2 (two) times daily.   hydrALAZINE 25 MG tablet Commonly known as: APRESOLINE Take 25 mg by mouth 3 (three) times daily.   insulin glargine 100 UNIT/ML Solostar Pen Commonly known as: LANTUS Inject 45-80 Units into the skin See admin instructions. Inject 45 units at bedtime. IF sugars >150 FOR ONE WEEK, increase insulin DOSE by FIVE units. Max of 80 units.   ipratropium-albuterol 0.5-2.5 (3) MG/3ML Soln Commonly known as: DUONEB Take 3 mLs by nebulization every 6 (six) hours as needed. What changed: reasons to take this   isosorbide mononitrate 30  MG 24 hr tablet Commonly known as: IMDUR Take 30 mg by mouth daily.   LORazepam 0.5 MG tablet Commonly known as: ATIVAN Take 0.25-0.5 mg by mouth every 4 (four) hours as needed for anxiety.   lubiprostone 8 MCG capsule Commonly known as: [name] Take 1 capsule (8 mcg total) by mouth 2 (two) times daily with a meal.   metFORMIN 500 MG tablet Commonly known as: GLUCOPHAGE TAKE ONE TABLET BY MOUTH EVERY MORNING and TAKE ONE TABLET BY MOUTH EVERY EVENING   nitroGLYCERIN 0.4 MG SL tablet Commonly known as: NITROSTAT DISSOLVE 1 TABLET UNDER THE TONGUE EVERY 5 MINUTES AS&nbsp;&nbsp;NEEDED FOR CHEST PAIN. MAX&nbsp;&nbsp;OF 3 TABLETS IN 15 MINUTES. CALL 911 IF PAIN PERSISTS. What changed:  how much to take how to take this when to take this reasons to take this additional instructions   ONE TOUCH ULTRA 2 w/Device Kit Use to test blood sugars 1-2 times daily.    onetouch ultrasoft lancets USE TO TEST BLOOD SUGAR 1-2 TIMES DAILY   oxyCODONE 5 MG immediate release tablet Commonly known as: Oxy IR/ROXICODONE Take 5 mg by mouth every 2 (two) hours as needed for severe pain or moderate pain.   pantoprazole 40 MG tablet Commonly known as: PROTONIX TAKE ONE TABLET BY MOUTH EVERY MORNING   Pen Needles 31G X 6 MM Misc 1 application by Does not apply route at bedtime.   predniSONE 20 MG tablet Commonly known as: DELTASONE Take 2 tablets (40 mg total) by mouth daily with breakfast for 2 days, THEN 1 tablet (20 mg total) daily with breakfast for 2 days, THEN 0.5 tablets (10 mg total) daily with breakfast. Start taking on: April 10, 2021 What changed:  medication strength See the new instructions.   pseudoephedrine 60 MG tablet Commonly known as: SUDAFED Take 60 mg by mouth every 4 (four) hours as needed for congestion.   rosuvastatin 20 MG tablet Commonly known as: CRESTOR Take 1 tablet (20 mg total) by mouth daily.   sacubitril-valsartan 24-26 MG Commonly known as: ENTRESTO Take 1 tablet by mouth 2 (two) times daily.   Senna-Time 8.6 MG tablet Generic drug: senna Take 1 tablet by mouth daily as needed for constipation.   spironolactone 25 MG tablet Commonly known as: ALDACTONE Take 1 tablet (25 mg total) by mouth daily.   Trelegy Ellipta 100-62.5-25 MCG/INH Aepb Generic drug: Fluticasone-Umeclidin-Vilant Inhale 1 puff into the lungs daily.        Follow-up Information     [name], MD. Go on 04/15/2021.   Specialty: Family Medicine Why: _0 :30 Contact information: [place] [address] [phone]                Allergies  Allergen Reactions   Lisinopril Swelling   Other Other (See Comments)    Lettuce : rash   Tomato Rash     If you experience worsening of your admission symptoms, develop shortness of breath, life threatening emergency, suicidal or homicidal thoughts you must  seek medical attention immediately by calling 911 or calling your MD immediately  if symptoms less severe.    Please note   You were cared for by a hospitalist during your hospital stay. If you have any questions about your discharge medications or the care you received while you were in the hospital after you are discharged, you can call the unit and asked to speak with the hospitalist on call if the hospitalist that took care of you is not available. Once  you are discharged, your primary care physician will handle any further medical issues. Please note that NO REFILLS for any discharge medications will be authorized once you are discharged, as it is imperative that you return to your primary care physician (or establish a relationship with a primary care physician if you do not have one) for your aftercare needs so that they can reassess your need for medications and monitor your lab values.   Consultations: None   Procedures/Studies: DG CHEST PORT 1 VIEW  Result Date: 04/06/2021 CLINICAL DATA:  Shortness of breath EXAM: PORTABLE CHEST 1 VIEW COMPARISON:  04/06/2021, CT 10/03/2020 FINDINGS: Hyperinflation with emphysema. No focal opacity or pleural effusion. Normal cardiomediastinal silhouette with aortic atherosclerosis. No pneumothorax. IMPRESSION: No active disease.  Emphysema Electronically Signed   By: Donavan Foil M.D.   On: 04/06/2021 20:16   DG Chest Port 1 View  Result Date: 04/06/2021 CLINICAL DATA:  61 year old male with shortness of breath. EXAM: PORTABLE CHEST 1 VIEW COMPARISON:  Portable chest 02/16/2021 and earlier. FINDINGS: Portable AP upright view at 0741 hours. Cardiac size at the upper limits of normal. Other mediastinal contours are within normal limits. Visualized tracheal air column is within normal limits. Improved lung volumes since May, normal. Allowing for portable technique the lungs are clear. But there is attenuation of upper lobe bronchovascular markings which  corresponds to emphysema on CTA in January this year. No pneumothorax or pleural effusion. No osseous abnormality identified. IMPRESSION: Emphysema.  No acute cardiopulmonary abnormality. Electronically Signed   By: Genevie Ann M.D.   On: 04/06/2021 08:01       Subjective: Pt feeling well.  Didn't sleep well.  Tolerating Bipap fairly well.  Eager to go home. States he feels at his baseline with his shortness of breath.   Discharge Exam: Vitals:   04/09/21 0803 04/09/21 1125  BP: (!) 161/82 (!) 144/79  Pulse: 82 81  Resp: 18 18  Temp: 98.2 F (36.8 C) 98.5 F (36.9 C)  SpO2: 100% 100%   Vitals:   04/09/21 0358 04/09/21 0731 04/09/21 0803 04/09/21 1125  BP: (!) 162/77  (!) 161/82 (!) 144/79  Pulse: 78  82 81  Resp: _0 Temp: 98.3 F (36.8 C)  98.2 F (36.8 C) 98.5 F (36.9 C)  TempSrc: Oral  Oral Oral  SpO2: 100% 99% 100% 100%  Weight:      Height:        General: Pt is alert, awake, not in acute distress Cardiovascular: RRR, S1/S2 +, no rubs, no gallops Respiratory: diminished profoundly,no wheezing, no rhonchi Abdominal: Soft, NT, ND, bowel sounds + Extremities: no edema, no cyanosis    The results of significant diagnostics from this hospitalization (including imaging, microbiology, ancillary and laboratory) are listed below for reference.     Microbiology: Recent Results (from the past 240 hour(s))  SARS CORONAVIRUS 2 (TAT 6-24 HRS) Nasopharyngeal Nasopharyngeal Swab     Status: None   Collection Time: 04/06/21  7:28 AM   Specimen: Nasopharyngeal Swab  Result Value Ref Range Status   SARS Coronavirus 2 NEGATIVE NEGATIVE Final    Comment: (NOTE) SARS-CoV-2 target nucleic acids are NOT DETECTED.  The SARS-CoV-2 RNA is generally detectable in upper and lower respiratory specimens during the acute phase of infection. Negative results do not preclude SARS-CoV-2 infection, do not rule out co-infections with other pathogens, and should not be used as the sole  basis for treatment or other patient management decisions. Negative results must be  combined with clinical observations, patient history, and epidemiological information. The expected result is Negative.  Fact Sheet for Patients: SugarRoll.be  Fact Sheet for Healthcare Providers: https://www.woods-mathews.com/  This test is not yet approved or cleared by the [place] FDA and  has been authorized for detection and/or diagnosis of SARS-CoV-2 by FDA under an Emergency Use Authorization (EUA). This EUA will remain  in effect (meaning this test can be used) for the duration of the COVID-19 declaration under Se ction 564(b)(1) of the Act, 21 U.S.C. section 360bbb-3(b)(1), unless the authorization is terminated or revoked sooner.  Performed at [facility], [place] [address] [place], [place] [address]      Labs: BNP (last 3 results) Recent Labs    01/29/21 0005 02/16/21 2132 04/06/21 0725  BNP 432.5* 509.8* 941.7*   Basic Metabolic Panel: Recent Labs  Lab 04/06/21 0725 04/07/21 0155 04/08/21 0623 04/09/21 0201  NA 134* 132* 134* 134*  K 3.8 4.3 4.8 3.9  CL 88* 89* 94* 93*  CO2 39* 38* 33* 33*  GLUCOSE 310* 210* 319* 264*  BUN _0 CREATININE 1.71* 1.28* 1.26* 1.14  CALCIUM 9.0 9.1 9.1 8.9  MG  --   --   --  2.1   Liver Function Tests: Recent Labs  Lab 04/06/21 0725  AST 24  ALT 27  ALKPHOS 69  BILITOT 0.6  PROT 6.3*  ALBUMIN 3.6   No results for input(s): LIPASE, AMYLASE in the last 168 hours. No results for input(s): AMMONIA in the last 168 hours. CBC: Recent Labs  Lab 04/06/21 0725 04/07/21 0155 04/08/21 0623  WBC 14.4* 10.4 9.1  NEUTROABS 10.3*  --   --   HGB 9.7* 9.1* 9.3*  HCT 33.8* 30.8* 31.2*  MCV 91.8 89.0 89.1  PLT 263 209 247   Cardiac Enzymes: No results for input(s): CKTOTAL, CKMB, CKMBINDEX, TROPONINI in the last 168 hours. BNP: Invalid input(s): POCBNP CBG: Recent Labs   Lab 04/08/21 2146 04/09/21 0608 04/09/21 0955 04/09/21 1022 04/09/21 1128  GLUCAP 99 161* 60* 100* 144*   D-Dimer No results for input(s): DDIMER in the last 72 hours. Hgb A1c Recent Labs    04/08/21 0623  HGBA1C 10.3*   Lipid Profile No results for input(s): CHOL, HDL, LDLCALC, TRIG, CHOLHDL, LDLDIRECT in the last 72 hours. Thyroid function studies No results for input(s): TSH, T4TOTAL, T3FREE, THYROIDAB in the last 72 hours.  Invalid input(s): FREET3 Anemia work up No results for input(s): VITAMINB12, FOLATE, FERRITIN, TIBC, IRON, RETICCTPCT in the last 72 hours. Urinalysis    Component Value Date/Time   COLORURINE AMBER (A) 10/03/2020 2036   APPEARANCEUR CLOUDY (A) 10/03/2020 2036   LABSPEC 1.016 10/03/2020 2036   PHURINE 6.0 10/03/2020 2036   GLUCOSEU >=500 (A) 10/03/2020 2036   GLUCOSEU NEGATIVE 05/20/2019 0825   HGBUR NEGATIVE 10/03/2020 2036   BILIRUBINUR NEGATIVE 10/03/2020 2036   KETONESUR NEGATIVE 10/03/2020 2036   PROTEINUR >=300 (A) 10/03/2020 2036   UROBILINOGEN 1.0 05/20/2019 0825   NITRITE NEGATIVE 10/03/2020 2036   LEUKOCYTESUR NEGATIVE 10/03/2020 2036   Sepsis Labs Invalid input(s): PROCALCITONIN,  WBC,  LACTICIDVEN Microbiology Recent Results (from the past 240 hour(s))  SARS CORONAVIRUS 2 (TAT 6-24 HRS) Nasopharyngeal Nasopharyngeal Swab     Status: None   Collection Time: 04/06/21  7:28 AM   Specimen: Nasopharyngeal Swab  Result Value Ref Range Status   SARS Coronavirus 2 NEGATIVE NEGATIVE Final    Comment: (NOTE) SARS-CoV-2 target nucleic acids are  NOT DETECTED.  The SARS-CoV-2 RNA is generally detectable in upper and lower respiratory specimens during the acute phase of infection. Negative results do not preclude SARS-CoV-2 infection, do not rule out co-infections with other pathogens, and should not be used as the sole basis for treatment or other patient management decisions. Negative results must be combined with clinical  observations, patient history, and epidemiological information. The expected result is Negative.  Fact Sheet for Patients: SugarRoll.be  Fact Sheet for Healthcare Providers: https://www.woods-mathews.com/  This test is not yet approved or cleared by the [place] FDA and  has been authorized for detection and/or diagnosis of SARS-CoV-2 by FDA under an Emergency Use Authorization (EUA). This EUA will remain  in effect (meaning this test can be used) for the duration of the COVID-19 declaration under Se ction 564(b)(1) of the Act, 21 U.S.C. section 360bbb-3(b)(1), unless the authorization is terminated or revoked sooner.  Performed at [facility], [place] [address], [place], [place] [address]      Time coordinating discharge: Over 30 minutes  SIGNED:   Ezekiel Slocumb, DO [facility] 04/09/2021, 12:51 PM   If 7PM-7AM, please contact night-coverage [email or web address]

## 2021-04-09 NOTE — Telephone Encounter (Signed)
CSW was advised pt is being followed by Hospice. CSW will sign off at this time per policy.  CSW attempted to reach pt/family today- will send closure letter to pt and advise PCP and team of above.  [name] MSW, LCSW Licensed Clinical Social Worker [employer] [phone]

## 2021-04-09 NOTE — TOC Transition Note (Signed)
Transition of Care [facility]) - CM/SW Discharge Note   Patient Details  Name: Kenneth [name] MRN: [id number] Date of Birth: 19-Oct-1960  Transition of Care [facility]) CM/SW Contact:  Leone Haven, RN Phone Number: 04/09/2021, 1:43 PM   Clinical Narrative:    Patient for dc home with hospice with [facility].  Hospice is ordering him a bipap. Resp therapist will come to fit patient and they will set it up at patient's home today.  Finance will come to transport him home.  Cheri with Hospice notified of the dc.    Final next level of care: Home w Hospice Care Barriers to Discharge: No Barriers Identified   Patient Goals and CMS Choice Patient states their goals for this hospitalization and ongoing recovery are:: return home with hospice and bipap      Discharge Placement                       Discharge Plan and Services                  DME Agency: NA       HH Arranged: RN [facility] Agency: [facility] Date HH Agency Contacted: 04/08/21 Time HH Agency Contacted: 1000 Representative spoke with at [facility] Agency: Cherie  Social Determinants of Health (SDOH) Interventions     Readmission Risk Interventions Readmission Risk Prevention Plan 04/09/2021  Transportation Screening Complete  Medication Review [facility]) Complete  PCP or Specialist appointment within 3-5 days of discharge Complete  HRI or Home Care Consult Complete  SW Recovery Care/Counseling Consult Complete  Palliative Care Screening Complete  Skilled Nursing Facility Not Applicable  Some recent data might be hidden

## 2021-04-09 NOTE — TOC Initial Note (Signed)
Transition of Care [facility]) - Initial/Assessment Note    Patient Details  Name: Kenneth [name] MRN: [id number] Date of Birth: March 31, 1961  Transition of Care [facility]) CM/SW Contact:    Leone Haven, RN Phone Number: 04/09/2021, 1:40 PM  Clinical Narrative:                 Patient for dc home with hospice with [facility].  Hospice is ordering him a bipap. Resp therapist will come to fit patient and they will set it up at patient's home today.  Finance will come to transport him home.  Cheri with Hospice notified of the dc.  Expected Discharge Plan: Home w Hospice Care Barriers to Discharge: No Barriers Identified   Patient Goals and CMS Choice Patient states their goals for this hospitalization and ongoing recovery are:: return home with hospice and bipap      Expected Discharge Plan and Services Expected Discharge Plan: Home w Hospice Care       Living arrangements for the past 2 months: Single Family Home Expected Discharge Date: 04/09/21                 DME Agency: NA       HH Arranged: RN HH Agency: [facility] Date HH Agency Contacted: 04/08/21 Time HH Agency Contacted: 1000 Representative spoke with at [facility] Agency: Cherie  Prior Living Arrangements/Services Living arrangements for the past 2 months: Single Family Home Lives with:: Spouse Patient language and need for interpreter reviewed:: Yes Do you feel safe going back to the place where you live?: Yes      Need for Family Participation in Patient Care: Yes (Comment) Care giver support system in place?: Yes (comment)   Criminal Activity/Legal Involvement Pertinent to Current Situation/Hospitalization: No - Comment as needed  Activities of Daily Living      Permission Sought/Granted                  Emotional Assessment       Orientation: : Oriented to Place, Oriented to Self, Oriented to Situation, Oriented to  Time Alcohol / Substance Use: Not Applicable Psych  Involvement: No (comment)  Admission diagnosis:  Respiratory distress [R06.03] COPD exacerbation (HCC) [J44.1] Patient Active Problem List   Diagnosis Date Noted   COPD exacerbation (HCC) 04/06/2021   Chronic kidney disease, stage 3a (HCC) 01/29/2021   Acute on chronic combined systolic and diastolic CHF (congestive heart failure) (HCC) 01/22/2021   Mixed diabetic hyperlipidemia associated with type 2 diabetes mellitus (HCC) 01/22/2021   Tinea pedis 01/15/2021   Pes planus 01/15/2021   Chronic respiratory failure with hypoxia (HCC) 12/07/2020   Oral candidiasis 12/07/2020   Cardiac arrest (HCC) 10/04/2020   Acute kidney injury superimposed on CKD (HCC) 09/23/2020   Thiamine deficiency 07/02/2020   Obstructive sleep apnea 05/02/2020   Hyperlipidemia    Chronic combined systolic and diastolic congestive heart failure (HCC) 04/07/2020   Chronic constipation 07/19/2019   Snoring 03/29/2019   Insomnia due to other mental disorder 11/15/2018   Anticoagulant long-term use 10/21/2018   Gastroesophageal reflux disease without esophagitis 08/10/2018   Tobacco abuse 08/10/2018   Chest pain 07/27/2018   Hypertension associated with diabetes (HCC) 07/06/2018   Chronic obstructive pulmonary disease (HCC) 07/06/2018   Goals of care, counseling/discussion 07/06/2018   Alcohol abuse 07/06/2018   Paroxysmal atrial fibrillation (HCC) 11/28/2017   Slow transit constipation 06/07/2016   Chronic anemia 06/06/2016   Hypertensive kidney disease with chronic kidney  disease stage III (HCC) 06/06/2016   Mixed anxiety and depressive disorder 06/05/2016   Type 2 diabetes mellitus with stage 3a chronic kidney disease, with long-term current use of insulin (HCC) 06/05/2016   PCP:  [name], MD Pharmacy:   [facility] - [place], [place] - [address] [place] [address] Phone: [phone] Fax: [phone]  [facility] [address] [place] [address] Phone: [phone] Fax: [phone]     Social Determinants of Health (SDOH) Interventions    Readmission Risk Interventions Readmission Risk Prevention Plan 04/09/2021  Transportation Screening Complete  Medication Review [employer]) Complete  PCP or Specialist appointment within 3-5 days of discharge Complete  HRI or Home Care Consult Complete  SW Recovery Care/Counseling Consult Complete  Palliative Care Screening Complete  Skilled Nursing Facility Not Applicable  Some recent data might be hidden

## 2021-04-09 NOTE — Progress Notes (Signed)
Inpatient Diabetes Program Recommendations  AACE/ADA: New Consensus Statement on Inpatient Glycemic Control (2015)  Target Ranges:  Prepandial:   less than 140 mg/dL      Peak postprandial:   less than 180 mg/dL (1-2 hours)      Critically ill patients:  140 - 180 mg/dL  Results for Kenneth [name], Kenneth [name] (MRN [id number]) as of 04/09/2021 09:59  Ref. Range 04/08/2021 06:07 04/08/2021 11:39 04/08/2021 16:33 04/08/2021 21:32 04/08/2021 21:46  Glucose-Capillary Latest Ref Range: 70 - 99 mg/dL 182 (H)  8 units NOVOLOG 176 (H)  10 units Lantus@9 :30 357 (H)  20 units NOVOLOG  49 (L)  20  units Lantus @20 :42 99  Results for Kenneth [name], Kenneth [name] (MRN Gwenith Daily) as of 04/09/2021 09:59  Ref. Range 04/09/2021 06:08 04/09/2021 09:55  Glucose-Capillary Latest Ref Range: 70 - 99 mg/dL 04/11/2021 (H)  4 units NOVOLOG  60 (L)   Admit with: Acute on chronic respiratory failure associated with a COPD exacerbation   History: DM, Hospice at Home (remains full code), CHF, COPD   Home DM Meds: Lantus 45 units QHS                             Metformin 500 mg BID                             Decadron and Prednisone also listed in Home Meds   Current Orders: Lantus 10 units Daily                           Lantus 20 units QHS     Novolog Moderate Correction Scale/ SSI (0-15 units) TID AC                              Note Solumedrol stopped--last dose given yest at 9:30am  Has orders for Prednisone 40 mg Daily now  Note Hypoglycemia last PM and again this AM--Agree with reduction of Novolog SSi to the 0-15 unit scale (was 0-20 units)    --Will follow patient during hospitalization--  893 RN, MSN, CDE Diabetes Coordinator Inpatient Glycemic Control Team Team Pager: [phone] (8a-5p)

## 2021-04-10 NOTE — Progress Notes (Incomplete)
Cardiology Office Note:    Date:  04/10/2021   ID:  Kenneth [name], DOB 04/30/1961, MRN [id number]  PCP:  Kenneth Salter, MD  Cardiologist:  None  Referring MD: Kenneth Salter, MD   CC: hospital follow-up  History of Present Illness:    Kenneth [name] is a 60 y.o. male with a hx of chronic hypoxic respiratory failure on home O2, hypertension, type II diabetes on long term insulin, chronic kidney disease stage 3a, non-ischemic cardiomyopathy with EF 30-35% prior, recently improved, COPD who is seen for follow up today. I initially saw him 10/21/18 as a new patient to me at the request of Kenneth [name], for the evaluation and management of paroxysmal atrial fibrillation. He has a history of cardiac arrest 2/2 respiratory failure. He recently presented to the ED with respiratory failure with hypercapnia, unspecified chronicity (Kenneth [name]). He was discharged on 01/31/2021.  Per Hospitalist's notes: Patient is a 60 y.o. male with history of COPD, chronic hypoxic respiratory failure on 3 L of O2, Chronic systolic/diastolic heart failure, PAF on Eliquis, CKD stage IIIa, DM-2, HTN, HLD, depression/anxiety, OSA on CPAP-presented with worsening shortness of breath-he was thought to have acute on chronic hypoxic respiratory failure due to COPD exacerbation.  Acute on chronic hypoxic respiratory failure due to COPD exacerbation:  Required BiPAP on admission-treated with steroids-bronchodilators-is significantly improved.  Has not required BiPAP for 2 days.  This morning-he feels significantly better and thinks he is at baseline.  He has ambulated around the unit with rehab services-with oxygen saturation being in the high 90s.  Since he feels better-suspect he is stable to be discharged on tapering steroids and his usual bronchodilator regimen.  He is currently back on his usual 2-3 L of oxygen that he is on at home.  Previous visit: Reviewed his recent hospitalizations. He is frustrated by his recurrent  admissions for COPD exacerbations/respiratory failure. No signs/symptoms of heart failure, and most recent echo 10/04/20 noted recovered EF of 55-60%.  He is currently on 2-3L of oxygen at baseline. He notes his breathing seems to be improving slighty since his hospital visit. He notes having some LE edema the other day, but has not noticed it often. Occasionally wears compression socks and he believes they do help when he wears them. His weight has remained stable at home.   Typically he uses a CPAP at night, and occasionally during the day as well.  He is followed closely for his his pulmonary issues, will follow up in about a month. He is also seen by palliative care for his symptoms.  He denies any chest pain, palpitations, or exertional symptoms. No headaches, lightheadedness, or syncope to report. Also has no lower extremity edema, orthopnea or PND.  Today: He was admitted to the ED on 04/06/2021, presenting with acute on subacute shortness of breath that is not improving with nebulizer.   He denies any chest pain, shortness of breath, palpitations, or exertional symptoms. No headaches, lightheadedness, or syncope to report. Also has no lower extremity edema, orthopnea or PND.   Past Medical History:  Diagnosis Date   Anxiety    Arthritis    Atrial fibrillation (HCC)    Chest tightness 06/12/2019   CHF (congestive heart failure) (HCC)    COPD (chronic obstructive pulmonary disease) ([place])    emphysema   Depression    Diabetes mellitus without complication (HCC)    Heart murmur    Hyperlipidemia    Hypertension    Sleep apnea with use  of continuous positive airway pressure (CPAP)     Past Surgical History:  Procedure Laterality Date   NO PAST SURGERIES      Current Medications: Current Outpatient Medications on File Prior to Visit  Medication Sig   acetaminophen (TYLENOL) 325 MG tablet Take 650 mg by mouth every 6 (six) hours as needed for mild pain or headache.   albuterol  (PROVENTIL) (2.5 MG/3ML) 0.083% nebulizer solution Take 3 mLs (2.5 mg total) by nebulization every 6 (six) hours as needed for shortness of breath.   albuterol (VENTOLIN HFA) 108 (90 Base) MCG/ACT inhaler Inhale 2 puffs into the lungs every 4 (four) hours as needed for wheezing or shortness of breath.   amLODipine (NORVASC) 10 MG tablet Take 10 mg by mouth daily.   ARIPiprazole (ABILIFY) 5 MG tablet Take 7.5 mg by mouth daily.   Blood Glucose Monitoring Suppl (ONE TOUCH ULTRA 2) w/Device KIT Use to test blood sugars 1-2 times daily.   busPIRone (BUSPAR) 15 MG tablet Take 1 tablet (15 mg total) by mouth 2 (two) times daily.   carvedilol (COREG) 12.5 MG tablet Take 12.5 mg by mouth 2 (two) times daily.   dexamethasone (DECADRON) 4 MG tablet Take 4 mg by mouth daily as needed (sob).   ELIQUIS 5 MG TABS tablet TAKE ONE TABLET BY MOUTH EVERY MORNING and TAKE ONE TABLET BY MOUTH EVERY EVENING   famotidine (PEPCID) 20 MG tablet Take 20 mg by mouth daily.   FLUoxetine (PROZAC) 20 MG capsule Take 60 mg by mouth daily.   Fluticasone-Umeclidin-Vilant (TRELEGY ELLIPTA) 100-62.5-25 MCG/INH AEPB Inhale 1 puff into the lungs daily.   furosemide (LASIX) 40 MG tablet Take 1 tablet (40 mg total) by mouth 2 (two) times daily.   hydrALAZINE (APRESOLINE) 25 MG tablet Take 25 mg by mouth 3 (three) times daily.   insulin glargine (LANTUS) 100 UNIT/ML Solostar Pen Inject 45-80 Units into the skin See admin instructions. Inject 45 units at bedtime. IF sugars >150 FOR ONE WEEK, increase insulin DOSE by FIVE units. Max of 80 units.   Insulin Pen Needle (PEN NEEDLES) 31G X 6 MM MISC 1 application by Does not apply route at bedtime.   ipratropium-albuterol (DUONEB) 0.5-2.5 (3) MG/3ML SOLN Take 3 mLs by nebulization every 6 (six) hours as needed.   isosorbide mononitrate (IMDUR) 30 MG 24 hr tablet Take 30 mg by mouth daily.   Lancets (ONETOUCH ULTRASOFT) lancets USE TO TEST BLOOD SUGAR 1-2 TIMES DAILY   LORazepam (ATIVAN) 0.5  MG tablet Take 0.25-0.5 mg by mouth every 4 (four) hours as needed for anxiety.   lubiprostone ([name]) 8 MCG capsule Take 1 capsule (8 mcg total) by mouth 2 (two) times daily with a meal.   metFORMIN (GLUCOPHAGE) 500 MG tablet TAKE ONE TABLET BY MOUTH EVERY MORNING and TAKE ONE TABLET BY MOUTH EVERY EVENING   nitroGLYCERIN (NITROSTAT) 0.4 MG SL tablet DISSOLVE 1 TABLET UNDER THE TONGUE EVERY 5 MINUTES AS&nbsp;&nbsp;NEEDED FOR CHEST PAIN. MAX&nbsp;&nbsp;OF 3 TABLETS IN 15 MINUTES. CALL 911 IF PAIN PERSISTS.   oxyCODONE (OXY IR/ROXICODONE) 5 MG immediate release tablet Take 5 mg by mouth every 2 (two) hours as needed for severe pain or moderate pain.   pantoprazole (PROTONIX) 40 MG tablet TAKE ONE TABLET BY MOUTH EVERY MORNING   predniSONE (DELTASONE) 20 MG tablet Take 2 tablets (40 mg total) by mouth daily with breakfast for 2 days, THEN 1 tablet (20 mg total) daily with breakfast for 2 days, THEN 0.5 tablets (10 mg total)  daily with breakfast.   pseudoephedrine (SUDAFED) 60 MG tablet Take 60 mg by mouth every 4 (four) hours as needed for congestion.   roflumilast (DALIRESP) 500 MCG TABS tablet Take 1 tablet (500 mcg total) by mouth daily.   rosuvastatin (CRESTOR) 20 MG tablet Take 1 tablet (20 mg total) by mouth daily.   sacubitril-valsartan (ENTRESTO) 24-26 MG Take 1 tablet by mouth 2 (two) times daily.   SENNA-TIME 8.6 MG tablet Take 1 tablet by mouth daily as needed for constipation.   spironolactone (ALDACTONE) 25 MG tablet Take 1 tablet (25 mg total) by mouth daily.   [DISCONTINUED] omeprazole (PRILOSEC) 20 MG capsule Take 20 mg by mouth daily.   No current facility-administered medications on file prior to visit.     Allergies:   Lisinopril, Other, and Tomato   Social History   Tobacco Use   Smoking status: Some Days    Packs/day: 2.50    Years: 30.00    Pack years: 75.00    Types: Cigarettes    Last attempt to quit: 06/2020    Years since quitting: 0.8   Smokeless tobacco:  Never  Vaping Use   Vaping Use: Never used  Substance Use Topics   Alcohol use: Yes    Comment: pt stated that it was 6 months ago he drunk bourbon   Drug use: Yes    Frequency: 1.0 times per week    Types: Marijuana    Comment: occ marijuana    Family History: family history includes Coronary artery disease in his brother; Diabetes in his father, mother, and sister; Heart disease in his father, mother, and sister; Kidney disease in his sister; Liver disease in his maternal uncle.  ROS:   Please see the history of present illness. (+) All other systems are reviewed and negative.     EKGs/Labs/Other Studies Reviewed:    The following studies were reviewed today:  Echo 02/17/2021:  1. Left ventricular ejection fraction, by estimation, is 55 to 60%. The  left ventricle has normal function. The left ventricle has no regional  wall motion abnormalities. There is mild concentric left ventricular  hypertrophy. Left ventricular diastolic  parameters are indeterminate.   2. Right ventricular systolic function is normal. The right ventricular  size is normal.   3. Left atrial size was mildly dilated.   4. The mitral valve is normal in structure. No evidence of mitral valve  regurgitation. No evidence of mitral stenosis.   5. The aortic valve was not well visualized. There is mild calcification  of the aortic valve. Aortic valve regurgitation is mild. Mild aortic valve  stenosis.   6. The inferior vena cava is dilated in size with >50% respiratory  variability, suggesting right atrial pressure of 8 mmHg.   Comparison(s): No significant change from prior study.   Echo 10/04/2020: 1. Left ventricular ejection fraction, by estimation, is 55 to 60%. The  left ventricle has normal function. The left ventricle has no regional  wall motion abnormalities. There is mild left ventricular hypertrophy.  Left ventricular diastolic parameters  are indeterminate.   2. Right ventricular  systolic function is normal. The right ventricular  size is normal. Tricuspid regurgitation signal is inadequate for assessing  PA pressure.   3. A small pericardial effusion is present.   4. The mitral valve is normal in structure. No evidence of mitral valve  regurgitation.   5. The aortic valve was not well visualized. Aortic valve regurgitation  is mild. Mild to moderate  aortic valve stenosis. Vmax 2.8 m/s, MG 69mHg,  AVA 1.6 cm^2, DI 0.4   Echo 09/13/2020: 1. Left ventricular ejection fraction, by estimation, is 60 to 65%. The  left ventricle has normal function. The left ventricle has no regional  wall motion abnormalities. There is moderate concentric left ventricular  hypertrophy. Left ventricular  diastolic parameters are consistent with Grade I diastolic dysfunction  (impaired relaxation).   2. Right ventricular systolic function is normal. The right ventricular  size is normal. There is mildly elevated pulmonary artery systolic  pressure.   3. The mitral valve is normal in structure. No evidence of mitral valve  regurgitation. No evidence of mitral stenosis.   4. The aortic valve is tricuspid. Aortic valve regurgitation is mild to  moderate. Mild aortic valve stenosis. Aortic regurgitation PHT measures  434 msec. Aortic valve area, by VTI measures 1.38 cm. Aortic valve mean  gradient measures 12.0 mmHg. Aortic  valve Vmax measures 2.50 m/s.   5. The inferior vena cava is dilated in size with <50% respiratory  variability, suggesting right atrial pressure of 15 mmHg.  EKG:  EKG is personally reviewed. 04/11/2021: *** 02/07/2021: NSR at 79 bpm, LVH with repol.  Recent Labs: 04/06/2021: ALT 27; B Natriuretic Peptide 444.0 04/08/2021: Hemoglobin 9.3; Platelets 247 04/09/2021: BUN 18; Creatinine, Ser 1.14; Magnesium 2.1; Potassium 3.9; Sodium 134  Recent Lipid Panel    Component Value Date/Time   CHOL 226 (H) 07/06/2018 1355   TRIG 275 (H) 10/05/2020 0126   HDL 68.40  07/06/2018 1355   CHOLHDL 3 07/06/2018 1355   VLDL 38.0 07/06/2018 1355   LDLCALC 119 (H) 07/06/2018 1355    Physical Exam:    VS:  There were no vitals taken for this visit.    Wt Readings from Last 3 Encounters:  04/09/21 197 lb 1.5 oz (89.4 kg)  03/21/21 205 lb 6.4 oz (93.2 kg)  02/21/21 205 lb 3.2 oz (93.1 kg)    GEN: Well nourished, well developed in no acute distress HEENT: Normal, moist mucous membranes NECK: No JVD appreciated CARDIAC: distant heart sounds***, regular rhythm, normal S1 and S2, no rubs or gallops. No murmur. VASCULAR: Radial and DP pulses 2+ bilaterally. No carotid bruits RESPIRATORY:  On 2-3 L oxygen by [place]***. Distant breath sounds, prolonged expiratory phase*** but clear to auscultation without rales, wheezing or rhonchi  ABDOMEN: Soft, non-tender, non-distended MUSCULOSKELETAL:  Ambulates independently SKIN: Warm and dry, no edema NEUROLOGIC:  Alert and oriented x 3. No focal neuro deficits noted. PSYCHIATRIC:  Normal affect    ASSESSMENT:    No diagnosis found.  PLAN:    Chronic systolic and diastolic heart failure, with recovered EF: -appears euvolemic, weights stable -continue carvedilol, hydralazine/isosorbide, spironolactone -continue current furosemide dose.  -not trialed on entresto due to renal function, risk for angioedema  Paroxysmal atrial fibrillation -CHA2DS2/VAS Stroke Risk Points=3 -continue apixaban, denies bleeding issues   Hypertension: -at goal today -continue furosemide, spironolactone -continue carvedilol, hydralazine/isosorbide, amlodipine  Sleep apnea: -has CPAP, using.   Severe COPD: with chronic hypoxic respiratory failure. -followed by Dr. BLamonte Sakai multiple admissions for COPD exacerbation. Reviewed recent admission today -encouraged tobacco abstinence   Type II diabetes, with chronic kidney disease stage 3a: -on long term insulin -have discussed SGLT2i and GLP1RA, but cost is a concern for him. -continue  rosuvastatin -no longer on aspirin as he is on apixaban  Cardiac risk counseling and prevention recommendations: -recommend heart healthy/Mediterranean diet, with whole grains, fruits, vegetable, fish, lean meats, nuts, and  olive oil. Limit salt. -recommend moderate walking, 3-5 times/week for 30-50 minutes each session. Aim for at least 150 minutes.week. Goal should be pace of 3 miles/hours, or walking 1.5 miles in 30 minutes -recommend avoidance of tobacco products. Avoid excess alcohol.  Plan for follow up: 6 months or sooner as needed.***  Buford Dresser, MD, PhD, [facility]    Medication Adjustments/Labs and Tests Ordered: Current medicines are reviewed at length with the patient today.  Concerns regarding medicines are outlined above.  No orders of the defined types were placed in this encounter.  No orders of the defined types were placed in this encounter.   There are no Patient Instructions on file for this visit.   I,Mathew Stumpf,acting as a Education administrator for [employer], MD.,have documented all relevant documentation on the behalf of Buford Dresser, MD,as directed by  Buford Dresser, MD while in the presence of Buford Dresser, MD.  I, Madelin Rear, have reviewed all documentation for this visit. The documentation on 04/10/21 for the exam, diagnosis, procedures, and orders are all accurate and complete.  Signed, Buford Dresser, MD PhD 04/10/2021 7:54 AM    [place]

## 2021-04-11 ENCOUNTER — Ambulatory Visit (HOSPITAL_BASED_OUTPATIENT_CLINIC_OR_DEPARTMENT_OTHER): Payer: Medicare Other | Admitting: Cardiology

## 2021-04-12 ENCOUNTER — Telehealth: Payer: Self-pay | Admitting: *Deleted

## 2021-04-12 ENCOUNTER — Other Ambulatory Visit: Payer: Self-pay

## 2021-04-12 NOTE — Chronic Care Management (AMB) (Signed)
  Care Management   Note  04/12/2021 Name: Kenneth [name] MRN: [id number] DOB: 02/28/1961  Kenneth [name] is a 60 y.o. year old male who is a primary care patient of [name], MD and is actively engaged with the care management team. I reached out to Gwenith Daily by phone today to assist with re-scheduling a follow up visit with the RN Case Manager  Follow up plan: Unsuccessful telephone outreach attempt made. A HIPAA compliant phone message was left for the patient providing contact information and requesting a return call.   Burman Nieves, CCMA Care Guide, Embedded Care Coordination [facility]  Care Management  Direct Dial: [phone]

## 2021-04-15 ENCOUNTER — Ambulatory Visit (INDEPENDENT_AMBULATORY_CARE_PROVIDER_SITE_OTHER): Admitting: Family Medicine

## 2021-04-15 ENCOUNTER — Other Ambulatory Visit: Payer: Self-pay | Admitting: Nurse Practitioner

## 2021-04-15 ENCOUNTER — Other Ambulatory Visit: Payer: Self-pay

## 2021-04-15 VITALS — BP 154/70 | HR 93 | Temp 97.5°F | Ht 72.0 in | Wt 197.2 lb

## 2021-04-15 DIAGNOSIS — K5909 Other constipation: Secondary | ICD-10-CM

## 2021-04-15 DIAGNOSIS — E1165 Type 2 diabetes mellitus with hyperglycemia: Secondary | ICD-10-CM

## 2021-04-15 DIAGNOSIS — Z794 Long term (current) use of insulin: Secondary | ICD-10-CM | POA: Diagnosis not present

## 2021-04-15 DIAGNOSIS — I5042 Chronic combined systolic (congestive) and diastolic (congestive) heart failure: Secondary | ICD-10-CM

## 2021-04-15 DIAGNOSIS — J441 Chronic obstructive pulmonary disease with (acute) exacerbation: Secondary | ICD-10-CM | POA: Diagnosis not present

## 2021-04-15 DIAGNOSIS — N1831 Chronic kidney disease, stage 3a: Secondary | ICD-10-CM | POA: Diagnosis not present

## 2021-04-15 DIAGNOSIS — E1122 Type 2 diabetes mellitus with diabetic chronic kidney disease: Secondary | ICD-10-CM

## 2021-04-15 MED ORDER — LACTULOSE 10 GM/15ML PO SOLN: 10.0000 g | Freq: Every day | ORAL | 6 refills | Status: AC | PRN

## 2021-04-15 NOTE — Telephone Encounter (Signed)
Chart supports Rx 

## 2021-04-15 NOTE — Progress Notes (Signed)
[facility] [address] [place] [address] Dept: [phone] Dept Fax: [phone]  Office Visit  Subjective:    Patient ID: Kenneth [name], male    DOB: 1961-08-22, 59 y.o..   MRN: [id number]  Chief Complaint  Patient presents with   Hospitalization Follow-up   History of Present Illness:  Patient is in today for hospital follow-up. He was admitted at [facility] from 7/16-7/19 for an exacerbation of his COPD, complicated by acute kidney failure superimposed on CKD, acute on chronic combined systolic and diastolic heart failure, and Type 2 diabetes.  Kenneth [name] states his breathing is doing fairly well at this point. He continues on his inhalers (SABA, ICS/LAMA/LABA) and Daliresp. He is on 3 lpm of oxygen. He has not had significant cough. He is fairly house-bound at this point.  Kenneth [name] has a history of heart failure. He is currently managed on Entresto, NTG, isosorbide, Lasix, and carvedilol. He is also on Eliquis due to chronic atrial fibrillation. He has not noted any recent swelling in the legs. His weight is down 8 lbs from his last visit in late June.  Kenneth [name] notes that his blood sugars fluctuate from the 160-350. He takes Lantus 40 units on days his blood sugar is > 150. He is also back on metformin 500 mg daily.  Kenneth [name] has apparently been enrolled in Hospice. He notes he has a visit with them later today.  Past Medical History: Patient Active Problem List   Diagnosis Date Noted   Mixed diabetic hyperlipidemia associated with type 2 diabetes mellitus ([place]) 01/22/2021   Tinea pedis 01/15/2021   Pes planus 01/15/2021   Chronic respiratory failure with hypoxia ([place]) 12/07/2020   Oral candidiasis 12/07/2020   Cardiac arrest ([place]) 10/04/2020   Acute kidney injury superimposed on CKD ([place]) 09/23/2020   Thiamine deficiency 07/02/2020   Obstructive sleep apnea 05/02/2020   Chronic combined systolic and diastolic  congestive heart failure ([place]) 04/07/2020   Chronic constipation 07/19/2019   Snoring 03/29/2019   Insomnia due to other mental disorder 11/15/2018   Anticoagulant long-term use 10/21/2018   Gastroesophageal reflux disease without esophagitis 08/10/2018   Tobacco abuse 08/10/2018   Chest pain 07/27/2018   Hypertension associated with diabetes ([place]) 07/06/2018   Chronic obstructive pulmonary disease ([place]) 07/06/2018   Alcohol abuse 07/06/2018   Paroxysmal atrial fibrillation ([place]) 11/28/2017   Slow transit constipation 06/07/2016   Chronic anemia 06/06/2016   Hypertensive kidney disease with chronic kidney disease stage III ([place]) 06/06/2016   Mixed anxiety and depressive disorder 06/05/2016   Type 2 diabetes mellitus with stage 3a chronic kidney disease, with long-term current use of insulin ([place]) 06/05/2016   Past Surgical History:  Procedure Laterality Date   NO PAST SURGERIES     Family History  Problem Relation Age of Onset   Diabetes Mother    Heart disease Mother    Diabetes Father    Heart disease Father    Diabetes Sister    Heart disease Sister    Kidney disease Sister    Coronary artery disease Brother    Liver disease Maternal Uncle    Outpatient Medications Prior to Visit  Medication Sig Dispense Refill   acetaminophen (TYLENOL) 325 MG tablet Take 650 mg by mouth every 6 (six) hours as needed for mild pain or headache.     albuterol (PROVENTIL) (2.5 MG/3ML) 0.083% nebulizer solution Take 3 mLs (2.5 mg total) by nebulization every 6 (six) hours as  needed for shortness of breath.     albuterol (VENTOLIN HFA) 108 (90 Base) MCG/ACT inhaler Inhale 2 puffs into the lungs every 4 (four) hours as needed for wheezing or shortness of breath.     amLODipine (NORVASC) 10 MG tablet Take 10 mg by mouth daily.     ARIPiprazole (ABILIFY) 5 MG tablet Take 7.5 mg by mouth daily.     Blood Glucose Monitoring Suppl (ONE TOUCH ULTRA 2) w/Device KIT Use to test blood sugars 1-2 times  daily. 1 kit 0   busPIRone (BUSPAR) 15 MG tablet Take 1 tablet (15 mg total) by mouth 2 (two) times daily. 180 tablet 1   carvedilol (COREG) 12.5 MG tablet Take 12.5 mg by mouth 2 (two) times daily.     dexamethasone (DECADRON) 4 MG tablet Take 4 mg by mouth daily as needed (sob).     ELIQUIS 5 MG TABS tablet TAKE ONE TABLET BY MOUTH EVERY MORNING and TAKE ONE TABLET BY MOUTH EVERY EVENING 180 tablet 1   famotidine (PEPCID) 20 MG tablet Take 20 mg by mouth daily.     FLUoxetine (PROZAC) 20 MG capsule Take 60 mg by mouth daily.     Fluticasone-Umeclidin-Vilant (TRELEGY ELLIPTA) 100-62.5-25 MCG/INH AEPB Inhale 1 puff into the lungs daily. 60 each 5   furosemide (LASIX) 40 MG tablet Take 1 tablet (40 mg total) by mouth 2 (two) times daily. 60 tablet 11   hydrALAZINE (APRESOLINE) 25 MG tablet Take 25 mg by mouth 3 (three) times daily.     insulin glargine (LANTUS) 100 UNIT/ML Solostar Pen Inject 45-80 Units into the skin See admin instructions. Inject 45 units at bedtime. IF sugars >150 FOR ONE WEEK, increase insulin DOSE by FIVE units. Max of 80 units.     ipratropium-albuterol (DUONEB) 0.5-2.5 (3) MG/3ML SOLN Take 3 mLs by nebulization every 6 (six) hours as needed. 360 mL 0   isosorbide mononitrate (IMDUR) 30 MG 24 hr tablet Take 30 mg by mouth daily.     Lancets (ONETOUCH ULTRASOFT) lancets USE TO TEST BLOOD SUGAR 1-2 TIMES DAILY 100 each 11   LORazepam (ATIVAN) 0.5 MG tablet Take 0.25-0.5 mg by mouth every 4 (four) hours as needed for anxiety.     lubiprostone ([name]) 8 MCG capsule Take 1 capsule (8 mcg total) by mouth 2 (two) times daily with a meal. 60 capsule 2   metFORMIN (GLUCOPHAGE) 500 MG tablet TAKE ONE TABLET BY MOUTH EVERY MORNING and TAKE ONE TABLET BY MOUTH EVERY EVENING 60 tablet 1   nitroGLYCERIN (NITROSTAT) 0.4 MG SL tablet DISSOLVE 1 TABLET UNDER THE TONGUE EVERY 5 MINUTES AS&nbsp;&nbsp;NEEDED FOR CHEST PAIN. MAX&nbsp;&nbsp;OF 3 TABLETS IN 15 MINUTES. CALL 911 IF PAIN PERSISTS. 75  tablet 4   oxyCODONE (OXY IR/ROXICODONE) 5 MG immediate release tablet Take 5 mg by mouth every 2 (two) hours as needed for severe pain or moderate pain.     pantoprazole (PROTONIX) 40 MG tablet TAKE ONE TABLET BY MOUTH EVERY MORNING 30 tablet 4   predniSONE (DELTASONE) 20 MG tablet Take 2 tablets (40 mg total) by mouth daily with breakfast for 2 days, THEN 1 tablet (20 mg total) daily with breakfast for 2 days, THEN 0.5 tablets (10 mg total) daily with breakfast. 30 tablet 0   pseudoephedrine (SUDAFED) 60 MG tablet Take 60 mg by mouth every 4 (four) hours as needed for congestion.     roflumilast (DALIRESP) 500 MCG TABS tablet Take 1 tablet (500 mcg total) by mouth daily. 90 tablet  3   rosuvastatin (CRESTOR) 20 MG tablet Take 1 tablet (20 mg total) by mouth daily. 30 tablet 3   sacubitril-valsartan (ENTRESTO) 24-26 MG Take 1 tablet by mouth 2 (two) times daily. 60 tablet 1   SENNA-TIME 8.6 MG tablet Take 1 tablet by mouth daily as needed for constipation.     spironolactone (ALDACTONE) 25 MG tablet Take 1 tablet (25 mg total) by mouth daily. 30 tablet 3   temazepam (RESTORIL) 15 MG capsule Take 15 mg by mouth at bedtime.     TRUEPLUS 5-BEVEL PEN NEEDLES 31G X 6 MM MISC USE AS DIRECTED EVERYDAY AT BEDTIME 100 each 1   No facility-administered medications prior to visit.   Allergies  Allergen Reactions   Lisinopril Swelling   Other Other (See Comments)    Lettuce : rash   Tomato Rash     Objective:   Today's Vitals   04/15/21 1122  BP: (!) 154/70  Pulse: 93  Temp: (!) 97.5 F (36.4 C)  SpO2: 97%  Weight: 197 lb 3.2 oz (89.4 kg)  Height: 6' (1.829 m)   Body mass index is 26.75 kg/m.   General: Well developed, well nourished. No acute distress. Lungs: Distant breath sounds. No obvious wheezing, rales, or ronchi. CV: Distant heart sounds. Pulses 2+, and regular. Extremities: No edema. Psych: Alert and oriented. Normal mood and affect.  Health Maintenance Due  Topic Date Due    Pneumococcal Vaccine 4-2 Years old (2 - PCV) 06/08/2017   Zoster Vaccines- Shingrix (2 of 2) 09/16/2019   COLONOSCOPY (Pts 45-28yr Insurance coverage will need to be confirmed)  09/23/2019   OPHTHALMOLOGY EXAM  05/12/2020   COVID-19 Vaccine (4 - Booster for Pfizer series) 01/26/2021     Assessment & Plan:   1. Chronic obstructive pulmonary disease with acute exacerbation ([facility] Kenneth [name] end-stage COPD. He is medically fragile with the combination of lung, heart, and kidney disease. He has enrolled in Hospice, which is appropriate at this point. We will continue his oxygen, inhalers, and Daliresp, as these would reduce symptoms and help to avoid hospitalizations.  2. Chronic combined systolic and diastolic congestive heart failure (HCC) Currently appears compensated. Continue carvedilol, Entresto, and Lasix.  3. Type 2 diabetes mellitus with stage 3a chronic kidney disease, with long-term current use of insulin (HCC) Blood sugar control has been worse during his recent hospitalizations. However, tight control may not be warranted now that he is on Hospice. I will check to make sure his CKD and electrolytes are stable.  - Lipid panel - Basic metabolic panel  4. Chronic constipation Received a fax from [facility]. Kenneth [name] been prescribed lubiprostone for management of chronic constipation. This is not covered for his insurance without prior authorization. Lactulose is covered and may be a better alternative. I will send in a new prescription.  - lactulose (CHRONULAC) 10 GM/15ML solution; Take 15-30 mLs (10-20 g total) by mouth daily as needed for moderate constipation.  Dispense: 236 mL; Refill: 6  SHaydee Salter MD

## 2021-04-16 ENCOUNTER — Encounter: Payer: Self-pay | Admitting: Podiatry

## 2021-04-16 ENCOUNTER — Ambulatory Visit (INDEPENDENT_AMBULATORY_CARE_PROVIDER_SITE_OTHER): Payer: Medicare Other | Admitting: Podiatry

## 2021-04-16 DIAGNOSIS — M79675 Pain in left toe(s): Secondary | ICD-10-CM | POA: Diagnosis not present

## 2021-04-16 DIAGNOSIS — B351 Tinea unguium: Secondary | ICD-10-CM | POA: Diagnosis not present

## 2021-04-16 DIAGNOSIS — E1140 Type 2 diabetes mellitus with diabetic neuropathy, unspecified: Secondary | ICD-10-CM

## 2021-04-16 DIAGNOSIS — Z794 Long term (current) use of insulin: Secondary | ICD-10-CM | POA: Diagnosis not present

## 2021-04-16 DIAGNOSIS — M79674 Pain in right toe(s): Secondary | ICD-10-CM | POA: Diagnosis not present

## 2021-04-16 DIAGNOSIS — L859 Epidermal thickening, unspecified: Secondary | ICD-10-CM | POA: Diagnosis not present

## 2021-04-16 LAB — BASIC METABOLIC PANEL
BUN: 23 mg/dL (ref 6–23)
CO2: 42 mEq/L — ABNORMAL HIGH (ref 19–32)
Calcium: 9.8 mg/dL (ref 8.4–10.5)
Chloride: 85 mEq/L — ABNORMAL LOW (ref 96–112)
Creatinine, Ser: 1.5 mg/dL (ref 0.40–1.50)
GFR: 50.55 mL/min — ABNORMAL LOW (ref >60.00)
Glucose, Bld: 263 mg/dL — ABNORMAL HIGH (ref 70–99)
Potassium: 3.9 mEq/L (ref 3.5–5.1)
Sodium: 135 mEq/L (ref 135–145)

## 2021-04-16 LAB — LIPID PANEL
Cholesterol: 192 mg/dL (ref 0–200)
HDL: 102.9 mg/dL (ref >39.00)
LDL Cholesterol: 53 mg/dL (ref 0–99)
NonHDL: 89.45
Total CHOL/HDL Ratio: 2
Triglycerides: 181 mg/dL — ABNORMAL HIGH (ref 0.0–149.0)
VLDL: 36.2 mg/dL (ref 0.0–40.0)

## 2021-04-16 NOTE — Patient Instructions (Signed)
Purchase Urea 20% over the counter and apply to rough areas of feet once daily.  May find on [place], [place], or [employer] fund[employer].  Once weekly use large emery board file to file rough skin. Do not be overly aggressive. Do not use sharp metal objects to achieve this.

## 2021-04-16 NOTE — Chronic Care Management (AMB) (Signed)
  Care Management   Note  04/16/2021 Name: Kenneth [name] MRN: [id number] DOB: 1961-04-04  Kenneth [name] is a 60 y.o. year old male who is a primary care patient of [name], MD and is actively engaged with the care management team. I reached out to Gwenith Daily by phone today to assist with re-scheduling a follow up visit with the RN Case Manager  Follow up plan: Telephone appointment with care management team member scheduled for: 05/08/2021  Burman Nieves, CCMA Care Guide, Embedded Care Coordination [facility]  Care Management  Direct Dial: [phone]

## 2021-04-16 NOTE — Progress Notes (Signed)
    Subjective:  Patient ID: Kenneth [name], male    DOB: 04/03/1961,  MRN: [id number]  Kenneth [name] presents to clinic today for at risk foot care with history of diabetic neuropathy and painful thick toenails that are difficult to trim. Pain interferes with ambulation. Aggravating factors include wearing enclosed shoe gear. Pain is relieved with periodic professional debridement.  Patient did not check his blood glucose today.  PCP is [name], Bertram Millard, MD , and last visit was 04/15/2021.  Allergies  Allergen Reactions   Lisinopril Swelling   Other Other (See Comments)    Lettuce : rash   Tomato Rash    Review of Systems: Negative except as noted in the HPI. Objective:   Constitutional Kenneth [name] is a pleasant 60 y.o. African American male, in NAD. AAO x 3.   Vascular Capillary refill time to digits immediate b/l. Palpable pedal pulses b/l LE. Pedal hair absent. Lower extremity skin temperature gradient within normal limits. No pain with calf compression b/l. No edema noted b/l lower extremities. No cyanosis or clubbing noted.  Neurologic Normal speech. Oriented to person, place, and time. Protective sensation diminished with 10g monofilament b/l.  Dermatologic Pedal skin with normal turgor, texture and tone b/l lower extremities. No open wounds b/l lower extremities. No interdigital macerations b/l lower extremities. Toenails 1-5 b/l elongated, discolored, dystrophic, thickened, crumbly with subungual debris and tenderness to dorsal palpation. Heels with mild hyperkeratosis. No erythema, no edema, no drainage, no fluctuance..  Orthopedic: Normal muscle strength 5/5 to all lower extremity muscle groups bilaterally. No pain crepitus or joint limitation noted with ROM b/l. Pes planus deformity noted b/l.    Radiographs: None Assessment:   1.  Pain due to onychomycosis of toenails of both feet   2. Hyperkeratosis   3. Type 2 diabetes mellitus with diabetic neuropathy, with long-term current use of insulin (HCC)    Plan:  -Examined patient. -Hyperkeratosis b/l heels gently filed with dremel. Moisturizer applied. Patient to purchase Urea 20% to rought areas of feet once daily. He was also instructed to use emery board to file rough skin. Do not use sharp metal objects. -Continue diabetic foot care principles. -Patient to continue soft, supportive shoe gear daily. -Toenails 1-5 b/l were debrided in length and girth with sterile nail nippers and dremel without iatrogenic bleeding.  -Patient to report any pedal injuries to medical professional immediately. -Patient/POA to call should there be question/concern in the interim.  Return in about 3 months (around 07/17/2021).  Freddie Breech, DPM

## 2021-04-17 ENCOUNTER — Telehealth: Payer: Self-pay

## 2021-04-17 NOTE — Telephone Encounter (Signed)
Spoke to patient regarding a medication change form Lubipristone 8 mcg caps to the [employer] powder due to insurance coverage.  No questions. Dm/cma

## 2021-04-19 DIAGNOSIS — J449 Chronic obstructive pulmonary disease, unspecified: Secondary | ICD-10-CM | POA: Diagnosis not present

## 2021-04-23 ENCOUNTER — Telehealth: Payer: Self-pay

## 2021-04-23 NOTE — Progress Notes (Signed)
Returning patient call per clinical pharmacist. Patient  significant other states patient is asleep and she will have patient to call me back.   Everlean Cherry Clinical Pharmacist Assistant [phone]

## 2021-04-25 DIAGNOSIS — J449 Chronic obstructive pulmonary disease, unspecified: Secondary | ICD-10-CM | POA: Diagnosis not present

## 2021-05-01 ENCOUNTER — Other Ambulatory Visit: Payer: Self-pay | Admitting: Family Medicine

## 2021-05-01 DIAGNOSIS — K5909 Other constipation: Secondary | ICD-10-CM

## 2021-05-02 ENCOUNTER — Ambulatory Visit (HOSPITAL_BASED_OUTPATIENT_CLINIC_OR_DEPARTMENT_OTHER): Payer: Medicare Other | Admitting: Cardiology

## 2021-05-02 NOTE — Progress Notes (Incomplete)
Cardiology Office Note:    Date:  05/02/2021   ID:  Kenneth [name], DOB 02/23/1961, MRN [id number]  PCP:  [name], MD  Cardiologist:  None  Referring MD: [name], MD   CC: hospital follow-up  History of Present Illness:    Kenneth [name] is a 60 y.o. male with a hx of chronic hypoxic respiratory failure on home O2, hypertension, type II diabetes on long term insulin, chronic kidney disease stage 3a, non-ischemic cardiomyopathy with EF 30-35% prior, recently improved, COPD who is seen for follow up today. I initially saw him 10/21/18 as a new patient to me at the request of [name], for the evaluation and management of paroxysmal atrial fibrillation. He has a history of cardiac arrest 2/2 respiratory failure. He recently presented to the ED with respiratory failure with hypercapnia, unspecified chronicity ([place]). He was discharged on 01/31/2021.  Per Hospitalist's notes: Patient is a 60 y.o. male with history of COPD, chronic hypoxic respiratory failure on 3 L of O2, Chronic systolic/diastolic heart failure, PAF on Eliquis, CKD stage IIIa, DM-2, HTN, HLD, depression/anxiety, OSA on CPAP-presented with worsening shortness of breath-he was thought to have acute on chronic hypoxic respiratory failure due to COPD exacerbation.  Acute on chronic hypoxic respiratory failure due to COPD exacerbation:  Required BiPAP on admission-treated with steroids-bronchodilators-is significantly improved.  Has not required BiPAP for 2 days.  This morning-he feels significantly better and thinks he is at baseline.  He has ambulated around the unit with rehab services-with oxygen saturation being in the high 90s.  Since he feels better-suspect he is stable to be discharged on tapering steroids and his usual bronchodilator regimen.  He is currently back on his usual 2-3 L of oxygen that he is on at home.  Today: Overall,  He denies any palpitations, chest pain, or shortness of breath. No  lightheadedness, headaches, syncope, orthopnea, or PND. Also has no lower extremity edema or exertional symptoms.   Past Medical History:  Diagnosis Date   Anxiety    Arthritis    Atrial fibrillation (HCC)    Chest tightness 06/12/2019   CHF (congestive heart failure) (HCC)    COPD (chronic obstructive pulmonary disease) (HCC)    emphysema   Depression    Diabetes mellitus without complication (HCC)    Heart murmur    Hyperlipidemia    Hypertension    Sleep apnea with use of continuous positive airway pressure (CPAP)     Past Surgical History:  Procedure Laterality Date   NO PAST SURGERIES      Current Medications: Current Outpatient Medications on File Prior to Visit  Medication Sig   acetaminophen (TYLENOL) 325 MG tablet Take 650 mg by mouth every 6 (six) hours as needed for mild pain or headache.   albuterol (PROVENTIL) (2.5 MG/3ML) 0.083% nebulizer solution Take 3 mLs (2.5 mg total) by nebulization every 6 (six) hours as needed for shortness of breath.   albuterol (VENTOLIN HFA) 108 (90 Base) MCG/ACT inhaler Inhale 2 puffs into the lungs every 4 (four) hours as needed for wheezing or shortness of breath.   amLODipine (NORVASC) 10 MG tablet Take 10 mg by mouth daily.   ARIPiprazole (ABILIFY) 5 MG tablet Take 7.5 mg by mouth daily.   Blood Glucose Monitoring Suppl (ONE TOUCH ULTRA 2) w/Device KIT Use to test blood sugars 1-2 times daily.   busPIRone (BUSPAR) 15 MG tablet Take 1 tablet (15 mg total) by mouth 2 (two) times daily.  carvedilol (COREG) 12.5 MG tablet Take 12.5 mg by mouth 2 (two) times daily.   dexamethasone (DECADRON) 4 MG tablet Take 4 mg by mouth daily as needed (sob).   ELIQUIS 5 MG TABS tablet TAKE ONE TABLET BY MOUTH EVERY MORNING and TAKE ONE TABLET BY MOUTH EVERY EVENING   famotidine (PEPCID) 20 MG tablet Take 20 mg by mouth daily.   FLUoxetine (PROZAC) 20 MG capsule Take 60 mg by mouth daily.   Fluticasone-Umeclidin-Vilant (TRELEGY ELLIPTA) 100-62.5-25  MCG/INH AEPB Inhale 1 puff into the lungs daily.   furosemide (LASIX) 40 MG tablet Take 1 tablet (40 mg total) by mouth 2 (two) times daily.   hydrALAZINE (APRESOLINE) 25 MG tablet Take 25 mg by mouth 3 (three) times daily.   insulin glargine (LANTUS) 100 UNIT/ML Solostar Pen Inject 45-80 Units into the skin See admin instructions. Inject 45 units at bedtime. IF sugars >150 FOR ONE WEEK, increase insulin DOSE by FIVE units. Max of 80 units.   ipratropium-albuterol (DUONEB) 0.5-2.5 (3) MG/3ML SOLN Take 3 mLs by nebulization every 6 (six) hours as needed.   isosorbide mononitrate (IMDUR) 30 MG 24 hr tablet Take 30 mg by mouth daily.   lactulose (CHRONULAC) 10 GM/15ML solution Take 15-30 mLs (10-20 g total) by mouth daily as needed for moderate constipation.   Lancets (ONETOUCH ULTRASOFT) lancets USE TO TEST BLOOD SUGAR 1-2 TIMES DAILY   LORazepam (ATIVAN) 0.5 MG tablet Take 0.25-0.5 mg by mouth every 4 (four) hours as needed for anxiety.   lubiprostone ([name]) 8 MCG capsule Take 1 capsule (8 mcg total) by mouth 2 (two) times daily with a meal.   metFORMIN (GLUCOPHAGE) 500 MG tablet TAKE ONE TABLET BY MOUTH EVERY MORNING and TAKE ONE TABLET BY MOUTH EVERY EVENING   nitroGLYCERIN (NITROSTAT) 0.4 MG SL tablet DISSOLVE 1 TABLET UNDER THE TONGUE EVERY 5 MINUTES AS&nbsp;&nbsp;NEEDED FOR CHEST PAIN. MAX&nbsp;&nbsp;OF 3 TABLETS IN 15 MINUTES. CALL 911 IF PAIN PERSISTS.   oxyCODONE (OXY IR/ROXICODONE) 5 MG immediate release tablet Take 5 mg by mouth every 2 (two) hours as needed for severe pain or moderate pain.   pantoprazole (PROTONIX) 40 MG tablet TAKE ONE TABLET BY MOUTH EVERY MORNING   predniSONE (DELTASONE) 20 MG tablet Take 2 tablets (40 mg total) by mouth daily with breakfast for 2 days, THEN 1 tablet (20 mg total) daily with breakfast for 2 days, THEN 0.5 tablets (10 mg total) daily with breakfast.   pseudoephedrine (SUDAFED) 60 MG tablet Take 60 mg by mouth every 4 (four) hours as needed for  congestion.   roflumilast (DALIRESP) 500 MCG TABS tablet Take 1 tablet (500 mcg total) by mouth daily.   rosuvastatin (CRESTOR) 20 MG tablet Take 1 tablet (20 mg total) by mouth daily.   sacubitril-valsartan (ENTRESTO) 24-26 MG Take 1 tablet by mouth 2 (two) times daily.   SENNA-TIME 8.6 MG tablet Take 1 tablet by mouth daily as needed for constipation.   spironolactone (ALDACTONE) 25 MG tablet Take 1 tablet (25 mg total) by mouth daily.   temazepam (RESTORIL) 15 MG capsule Take 15 mg by mouth at bedtime.   TRUEPLUS 5-BEVEL PEN NEEDLES 31G X 6 MM MISC USE AS DIRECTED EVERYDAY AT BEDTIME   [DISCONTINUED] omeprazole (PRILOSEC) 20 MG capsule Take 20 mg by mouth daily.   No current facility-administered medications on file prior to visit.     Allergies:   Lisinopril, Other, and Tomato   Social History   Tobacco Use   Smoking status: Some Days  Packs/day: 2.50    Years: 30.00    Pack years: 75.00    Types: Cigarettes    Last attempt to quit: 06/2020    Years since quitting: 0.8   Smokeless tobacco: Never  Vaping Use   Vaping Use: Never used  Substance Use Topics   Alcohol use: Yes    Comment: pt stated that it was 6 months ago he drunk bourbon   Drug use: Yes    Frequency: 1.0 times per week    Types: Marijuana    Comment: occ marijuana    Family History: family history includes Coronary artery disease in his brother; Diabetes in his father, mother, and sister; Heart disease in his father, mother, and sister; Kidney disease in his sister; Liver disease in his maternal uncle.  ROS:   Please see the history of present illness. (+) All other systems are reviewed and negative.   EKGs/Labs/Other Studies Reviewed:    The following studies were reviewed today:  Echo 02/17/2021: 1. Left ventricular ejection fraction, by estimation, is 55 to 60%. The  left ventricle has normal function. The left ventricle has no regional  wall motion abnormalities. There is mild concentric left  ventricular  hypertrophy. Left ventricular diastolic  parameters are indeterminate.   2. Right ventricular systolic function is normal. The right ventricular  size is normal.   3. Left atrial size was mildly dilated.   4. The mitral valve is normal in structure. No evidence of mitral valve  regurgitation. No evidence of mitral stenosis.   5. The aortic valve was not well visualized. There is mild calcification  of the aortic valve. Aortic valve regurgitation is mild. Mild aortic valve  stenosis.   6. The inferior vena cava is dilated in size with >50% respiratory  variability, suggesting right atrial pressure of 8 mmHg.   Comparison(s): No significant change from prior study.   Echo 10/04/2020: 1. Left ventricular ejection fraction, by estimation, is 55 to 60%. The  left ventricle has normal function. The left ventricle has no regional  wall motion abnormalities. There is mild left ventricular hypertrophy.  Left ventricular diastolic parameters  are indeterminate.   2. Right ventricular systolic function is normal. The right ventricular  size is normal. Tricuspid regurgitation signal is inadequate for assessing  PA pressure.   3. A small pericardial effusion is present.   4. The mitral valve is normal in structure. No evidence of mitral valve  regurgitation.   5. The aortic valve was not well visualized. Aortic valve regurgitation  is mild. Mild to moderate aortic valve stenosis. Vmax 2.8 m/s, MG 75mHg,  AVA 1.6 cm^2, DI 0.4   Echo 09/13/2020: 1. Left ventricular ejection fraction, by estimation, is 60 to 65%. The  left ventricle has normal function. The left ventricle has no regional  wall motion abnormalities. There is moderate concentric left ventricular  hypertrophy. Left ventricular  diastolic parameters are consistent with Grade I diastolic dysfunction  (impaired relaxation).   2. Right ventricular systolic function is normal. The right ventricular  size is normal. There  is mildly elevated pulmonary artery systolic  pressure.   3. The mitral valve is normal in structure. No evidence of mitral valve  regurgitation. No evidence of mitral stenosis.   4. The aortic valve is tricuspid. Aortic valve regurgitation is mild to  moderate. Mild aortic valve stenosis. Aortic regurgitation PHT measures  434 msec. Aortic valve area, by VTI measures 1.38 cm. Aortic valve mean  gradient measures 12.0 mmHg.  Aortic  valve Vmax measures 2.50 m/s.   5. The inferior vena cava is dilated in size with <50% respiratory  variability, suggesting right atrial pressure of 15 mmHg.  EKG:   05/02/2021: *** 02/07/2021: NSR at 79 bpm, LVH with repol.  Recent Labs: 04/06/2021: ALT 27; B Natriuretic Peptide 444.0 04/08/2021: Hemoglobin 9.3; Platelets 247 04/09/2021: Magnesium 2.1 04/15/2021: BUN 23; Creatinine, Ser 1.50; Potassium 3.9; Sodium 135  Recent Lipid Panel    Component Value Date/Time   CHOL 192 04/15/2021 1154   TRIG 181.0 (H) 04/15/2021 1154   HDL 102.90 04/15/2021 1154   CHOLHDL 2 04/15/2021 1154   VLDL 36.2 04/15/2021 1154   LDLCALC 53 04/15/2021 1154    Physical Exam:    VS:  There were no vitals taken for this visit.    Wt Readings from Last 3 Encounters:  04/15/21 197 lb 3.2 oz (89.4 kg)  04/09/21 197 lb 1.5 oz (89.4 kg)  03/21/21 205 lb 6.4 oz (93.2 kg)    GEN: Well nourished, well developed in no acute distress HEENT: Normal, moist mucous membranes NECK: No JVD appreciated CARDIAC: ***distant heart sounds, regular rhythm, normal S1 and S2, no rubs or gallops. No murmur. VASCULAR: Radial and DP pulses 2+ bilaterally. No carotid bruits RESPIRATORY:  ***On 2-3 L oxygen by [place]. Distant breath sounds, prolonged expiratory phase but clear to auscultation without rales, wheezing or rhonchi  ABDOMEN: Soft, non-tender, non-distended MUSCULOSKELETAL:  Ambulates independently SKIN: Warm and dry, no edema NEUROLOGIC:  Alert and oriented x 3. No focal neuro deficits  noted. PSYCHIATRIC:  Normal affect    ASSESSMENT:    No diagnosis found.  PLAN:    Chronic systolic and diastolic heart failure, with recovered EF: -appears euvolemic, weights stable -continue carvedilol, hydralazine/isosorbide, spironolactone -continue current furosemide dose.  -not trialed on entresto due to renal function, risk for angioedema  Paroxysmal atrial fibrillation -CHA2DS2/VAS Stroke Risk Points=3 -continue apixaban, denies bleeding issues   Hypertension: -at goal today -continue furosemide, spironolactone -continue carvedilol, hydralazine/isosorbide, amlodipine  Sleep apnea: -has CPAP, using.   Severe COPD: with chronic hypoxic respiratory failure. -followed by Dr. Lamonte Sakai, multiple admissions for COPD exacerbation. Reviewed recent admission today -encouraged tobacco abstinence   Type II diabetes, with chronic kidney disease stage 3a: -on long term insulin -have discussed SGLT2i and GLP1RA, but cost is a concern for him. -continue rosuvastatin -no longer on aspirin as he is on apixaban  Cardiac risk counseling and prevention recommendations: -recommend heart healthy/Mediterranean diet, with whole grains, fruits, vegetable, fish, lean meats, nuts, and olive oil. Limit salt. -recommend moderate walking, 3-5 times/week for 30-50 minutes each session. Aim for at least 150 minutes.week. Goal should be pace of 3 miles/hours, or walking 1.5 miles in 30 minutes -recommend avoidance of tobacco products. Avoid excess alcohol.  Plan for follow up: ***6 months or sooner as needed.  Buford Dresser, MD, PhD, [facility]    Medication Adjustments/Labs and Tests Ordered: Current medicines are reviewed at length with the patient today.  Concerns regarding medicines are outlined above.   No orders of the defined types were placed in this encounter.  No orders of the defined types were placed in this encounter.  There are no Patient  Instructions on file for this visit.   I,Mathew Stumpf,acting as a Education administrator for [employer], MD.,have documented all relevant documentation on the behalf of Buford Dresser, MD,as directed by  Buford Dresser, MD while in the presence of Buford Dresser, MD.  Alphonzo Grieve, have reviewed all documentation for this visit. The documentation on 05/02/21 for the exam, diagnosis, procedures, and orders are all accurate and complete.  Signed, Buford Dresser, MD PhD 05/02/2021 7:33 AM    [place]

## 2021-05-03 ENCOUNTER — Encounter (HOSPITAL_COMMUNITY): Payer: Self-pay

## 2021-05-03 ENCOUNTER — Emergency Department (HOSPITAL_COMMUNITY)

## 2021-05-03 ENCOUNTER — Other Ambulatory Visit: Payer: Self-pay

## 2021-05-03 ENCOUNTER — Emergency Department (HOSPITAL_COMMUNITY)
Admission: EM | Admit: 2021-05-03 | Discharge: 2021-05-03 | Disposition: A | Discharge status: Home / Self Care | Attending: Emergency Medicine | Admitting: Emergency Medicine

## 2021-05-03 DIAGNOSIS — I130 Hypertensive heart and chronic kidney disease with heart failure and stage 1 through stage 4 chronic kidney disease, or unspecified chronic kidney disease: Secondary | ICD-10-CM | POA: Diagnosis not present

## 2021-05-03 DIAGNOSIS — F17210 Nicotine dependence, cigarettes, uncomplicated: Secondary | ICD-10-CM | POA: Diagnosis not present

## 2021-05-03 DIAGNOSIS — R0602 Shortness of breath: Secondary | ICD-10-CM

## 2021-05-03 DIAGNOSIS — E1122 Type 2 diabetes mellitus with diabetic chronic kidney disease: Secondary | ICD-10-CM | POA: Insufficient documentation

## 2021-05-03 DIAGNOSIS — Z20822 Contact with and (suspected) exposure to covid-19: Secondary | ICD-10-CM | POA: Diagnosis not present

## 2021-05-03 DIAGNOSIS — R0682 Tachypnea, not elsewhere classified: Secondary | ICD-10-CM | POA: Insufficient documentation

## 2021-05-03 DIAGNOSIS — J441 Chronic obstructive pulmonary disease with (acute) exacerbation: Secondary | ICD-10-CM | POA: Insufficient documentation

## 2021-05-03 DIAGNOSIS — N1831 Chronic kidney disease, stage 3a: Secondary | ICD-10-CM | POA: Diagnosis not present

## 2021-05-03 DIAGNOSIS — Z743 Need for continuous supervision: Secondary | ICD-10-CM | POA: Diagnosis not present

## 2021-05-03 DIAGNOSIS — Z794 Long term (current) use of insulin: Secondary | ICD-10-CM | POA: Insufficient documentation

## 2021-05-03 DIAGNOSIS — I5042 Chronic combined systolic (congestive) and diastolic (congestive) heart failure: Secondary | ICD-10-CM | POA: Diagnosis not present

## 2021-05-03 DIAGNOSIS — Z79899 Other long term (current) drug therapy: Secondary | ICD-10-CM | POA: Diagnosis not present

## 2021-05-03 DIAGNOSIS — Z7984 Long term (current) use of oral hypoglycemic drugs: Secondary | ICD-10-CM | POA: Insufficient documentation

## 2021-05-03 DIAGNOSIS — R069 Unspecified abnormalities of breathing: Secondary | ICD-10-CM | POA: Diagnosis not present

## 2021-05-03 DIAGNOSIS — R6889 Other general symptoms and signs: Secondary | ICD-10-CM | POA: Diagnosis not present

## 2021-05-03 LAB — I-STAT VENOUS BLOOD GAS, ED
Acid-Base Excess: 21 mmol/L — ABNORMAL HIGH (ref 0.0–2.0)
Bicarbonate: 49.5 mmol/L — ABNORMAL HIGH (ref 20.0–28.0)
Calcium, Ion: 1.15 mmol/L (ref 1.15–1.40)
HCT: 29 % — ABNORMAL LOW (ref 39.0–52.0)
Hemoglobin: 9.9 g/dL — ABNORMAL LOW (ref 13.0–17.0)
O2 Saturation: 99 %
Potassium: 3.9 mmol/L (ref 3.5–5.1)
Sodium: 131 mmol/L — ABNORMAL LOW (ref 135–145)
TCO2: >50 mmol/L — ABNORMAL HIGH (ref 22–32)
pCO2, Ven: 80.1 mmHg (ref 44.0–60.0)
pH, Ven: 7.399 (ref 7.250–7.430)
pO2, Ven: 166 mmHg — ABNORMAL HIGH (ref 32.0–45.0)

## 2021-05-03 LAB — CBC WITH DIFFERENTIAL/PLATELET
Abs Immature Granulocytes: 0.14 10*3/uL — ABNORMAL HIGH (ref 0.00–0.07)
Basophils Absolute: 0 10*3/uL (ref 0.0–0.1)
Basophils Relative: 0 %
Eosinophils Absolute: 0 10*3/uL (ref 0.0–0.5)
Eosinophils Relative: 0 %
HCT: 28.6 % — ABNORMAL LOW (ref 39.0–52.0)
Hemoglobin: 8.7 g/dL — ABNORMAL LOW (ref 13.0–17.0)
Immature Granulocytes: 1 %
Lymphocytes Relative: 7 %
Lymphs Abs: 0.8 10*3/uL (ref 0.7–4.0)
MCH: 27.2 pg (ref 26.0–34.0)
MCHC: 30.4 g/dL (ref 30.0–36.0)
MCV: 89.4 fL (ref 80.0–100.0)
Monocytes Absolute: 0.9 10*3/uL (ref 0.1–1.0)
Monocytes Relative: 7 %
Neutro Abs: 10 10*3/uL — ABNORMAL HIGH (ref 1.7–7.7)
Neutrophils Relative %: 85 %
Platelets: 274 10*3/uL (ref 150–400)
RBC: 3.2 MIL/uL — ABNORMAL LOW (ref 4.22–5.81)
RDW: 15.1 % (ref 11.5–15.5)
WBC: 11.8 10*3/uL — ABNORMAL HIGH (ref 4.0–10.5)
nRBC: 0.2 % (ref 0.0–0.2)

## 2021-05-03 LAB — COMPREHENSIVE METABOLIC PANEL
ALT: 32 U/L (ref 0–44)
AST: 19 U/L (ref 15–41)
Albumin: 3.3 g/dL — ABNORMAL LOW (ref 3.5–5.0)
Alkaline Phosphatase: 57 U/L (ref 38–126)
Anion gap: 9 (ref 5–15)
BUN: 30 mg/dL — ABNORMAL HIGH (ref 6–20)
CO2: 42 mmol/L — ABNORMAL HIGH (ref 22–32)
Calcium: 9.3 mg/dL (ref 8.9–10.3)
Chloride: 83 mmol/L — ABNORMAL LOW (ref 98–111)
Creatinine, Ser: 1.98 mg/dL — ABNORMAL HIGH (ref 0.61–1.24)
GFR, Estimated: 38 mL/min — ABNORMAL LOW (ref >60)
Glucose, Bld: 199 mg/dL — ABNORMAL HIGH (ref 70–99)
Potassium: 4 mmol/L (ref 3.5–5.1)
Sodium: 134 mmol/L — ABNORMAL LOW (ref 135–145)
Total Bilirubin: 0.4 mg/dL (ref 0.3–1.2)
Total Protein: 6 g/dL — ABNORMAL LOW (ref 6.5–8.1)

## 2021-05-03 LAB — RESP PANEL BY RT-PCR (FLU A&B, COVID) ARPGX2
Influenza A by PCR: NEGATIVE
Influenza B by PCR: NEGATIVE
SARS Coronavirus 2 by RT PCR: NEGATIVE

## 2021-05-03 LAB — BRAIN NATRIURETIC PEPTIDE: B Natriuretic Peptide: 205.2 pg/mL — ABNORMAL HIGH (ref 0.0–100.0)

## 2021-05-03 MED ORDER — IPRATROPIUM-ALBUTEROL 0.5-2.5 (3) MG/3ML IN SOLN
3.0000 mL | Freq: Once | RESPIRATORY_TRACT | Status: AC
  Administered 2021-05-03: 3 mL via RESPIRATORY_TRACT
  Filled 2021-05-03: qty 3

## 2021-05-03 MED ORDER — MAGNESIUM SULFATE 2 GM/50ML IV SOLN
2.0000 g | Freq: Once | INTRAVENOUS | Status: AC
  Administered 2021-05-03: 2 g via INTRAVENOUS
  Filled 2021-05-03: qty 50

## 2021-05-03 MED ORDER — ALBUTEROL SULFATE (2.5 MG/3ML) 0.083% IN NEBU
INHALATION_SOLUTION | RESPIRATORY_TRACT | Status: AC
  Filled 2021-05-03: qty 6

## 2021-05-03 MED ORDER — ALBUTEROL SULFATE (2.5 MG/3ML) 0.083% IN NEBU
5.0000 mg | INHALATION_SOLUTION | Freq: Once | RESPIRATORY_TRACT | Status: AC
  Administered 2021-05-03: 5 mg via RESPIRATORY_TRACT

## 2021-05-03 MED ORDER — IPRATROPIUM-ALBUTEROL 0.5-2.5 (3) MG/3ML IN SOLN: 3.0000 mL | Freq: Four times a day (QID) | RESPIRATORY_TRACT | Status: DC

## 2021-05-03 MED ORDER — AMLODIPINE BESYLATE 5 MG PO TABS: 5.0000 mg | ORAL_TABLET | Freq: Once | ORAL | Status: DC

## 2021-05-03 MED ORDER — METHYLPREDNISOLONE SODIUM SUCC 125 MG IJ SOLR
125.0000 mg | Freq: Once | INTRAMUSCULAR | Status: AC
  Administered 2021-05-03: 125 mg via INTRAVENOUS
  Filled 2021-05-03: qty 2

## 2021-05-03 MED ORDER — PREDNISONE 10 MG PO TABS: 40.0000 mg | ORAL_TABLET | Freq: Every day | ORAL | 0 refills | Status: DC

## 2021-05-03 NOTE — ED Notes (Signed)
RN updated EDP re: pt BP. Pt did not take home BP meds PTA, likely contributing to elevation. Awaiting EDP response.

## 2021-05-03 NOTE — ED Provider Notes (Signed)
[facility] EMERGENCY DEPARTMENT Provider Note   CSN: [id number] Arrival date & time: 05/03/21  0757     History Chief Complaint  Patient presents with   Shortness of Breath    Kenneth [name] is a 60 y.o. male with a past medical history of COPD, CHF, A. fib, hypertension, diabetes presenting to the ED with a chief complaint of shortness of breath.  States that for the past day has been experiencing shortness of breath not improved with his home DuoNebs.  He has supplemental oxygen at home that he uses as needed which improved his symptoms.  Reports associated wheezing and nonproductive cough.  Denies any chest pain or significant leg swelling.  No sick contacts with similar symptoms.  No fevers.  No abdominal pain, vomiting.  Reports compliance with medications at home.  HPI     Past Medical History:  Diagnosis Date   Anxiety    Arthritis    Atrial fibrillation ([place])    Chest tightness 06/12/2019   CHF (congestive heart failure) (HCC)    COPD (chronic obstructive pulmonary disease) ([place])    emphysema   Depression    Diabetes mellitus without complication (HCC)    Heart murmur    Hyperlipidemia    Hypertension    Sleep apnea with use of continuous positive airway pressure (CPAP)     Patient Active Problem List   Diagnosis Date Noted   Mixed diabetic hyperlipidemia associated with type 2 diabetes mellitus ([place]) 01/22/2021   Tinea pedis 01/15/2021   Pes planus 01/15/2021   Chronic respiratory failure with hypoxia ([place]) 12/07/2020   Oral candidiasis 12/07/2020   Cardiac arrest ([place]) 10/04/2020   Acute kidney injury superimposed on CKD ([place]) 09/23/2020   Thiamine deficiency 07/02/2020   Obstructive sleep apnea 05/02/2020   Chronic combined systolic and diastolic congestive heart failure ([place]) 04/07/2020   Chronic constipation 07/19/2019   Snoring 03/29/2019   Insomnia due to other mental disorder 11/15/2018   Anticoagulant long-term use 10/21/2018    Gastroesophageal reflux disease without esophagitis 08/10/2018   Tobacco abuse 08/10/2018   Chest pain 07/27/2018   Hypertension associated with diabetes ([place]) 07/06/2018   Chronic obstructive pulmonary disease ([place]) 07/06/2018   Alcohol abuse 07/06/2018   Paroxysmal atrial fibrillation ([place]) 11/28/2017   Slow transit constipation 06/07/2016   Chronic anemia 06/06/2016   Hypertensive kidney disease with chronic kidney disease stage III ([place]) 06/06/2016   Mixed anxiety and depressive disorder 06/05/2016   Type 2 diabetes mellitus with stage 3a chronic kidney disease, with long-term current use of insulin ([place]) 06/05/2016    Past Surgical History:  Procedure Laterality Date   NO PAST SURGERIES         Family History  Problem Relation Age of Onset   Diabetes Mother    Heart disease Mother    Diabetes Father    Heart disease Father    Diabetes Sister    Heart disease Sister    Kidney disease Sister    Coronary artery disease Brother    Liver disease Maternal Uncle     Social History   Tobacco Use   Smoking status: Some Days    Packs/day: 2.50    Years: 30.00    Pack years: 75.00    Types: Cigarettes    Last attempt to quit: 06/2020    Years since quitting: 0.8   Smokeless tobacco: Never  Vaping Use   Vaping Use: Never used  Substance Use Topics   Alcohol use: Yes  Comment: pt stated that it was 6 months ago he drunk bourbon   Drug use: Yes    Frequency: 1.0 times per week    Types: Marijuana    Comment: occ marijuana    Home Medications Prior to Admission medications   Medication Sig Start Date End Date Taking? Authorizing Provider  acetaminophen (TYLENOL) 325 MG tablet Take 650 mg by mouth every 6 (six) hours as needed for mild pain or headache.   Yes [provider]  albuterol (VENTOLIN HFA) 108 (90 Base) MCG/ACT inhaler Inhale 2 puffs into the lungs every 4 (four) hours as needed for wheezing or shortness of breath. 04/01/21  Yes [provider]  amLODipine (NORVASC) 5 MG tablet Take 5 mg by mouth daily. 04/25/21  Yes [provider]  ARIPiprazole (ABILIFY) 5 MG tablet Take 7.5 mg by mouth daily.   Yes [provider]  busPIRone (BUSPAR) 15 MG tablet Take 1 tablet (15 mg total) by mouth 2 (two) times daily. 09/06/19  Yes [name], MD  carvedilol (COREG) 12.5 MG tablet Take 12.5 mg by mouth 2 (two) times daily. 03/28/21  Yes [provider]  dexamethasone (DECADRON) 4 MG tablet Take 4 mg by mouth daily. 04/03/21  Yes [provider]  famotidine (PEPCID) 20 MG tablet Take 20 mg by mouth daily.   Yes [provider]  FLUoxetine (PROZAC) 20 MG capsule Take 60 mg by mouth daily.   Yes [provider]  Fluticasone-Umeclidin-Vilant (TRELEGY ELLIPTA) 100-62.5-25 MCG/INH AEPB Inhale 1 puff into the lungs daily. 04/02/20  Yes Collene Gobble, MD  furosemide (LASIX) 40 MG tablet Take 1 tablet (40 mg total) by mouth 2 (two) times daily. 08/02/20  Yes Buford Dresser, MD  hydrALAZINE (APRESOLINE) 25 MG tablet Take 25 mg by mouth 3 (three) times daily.   Yes [provider]  insulin glargine (LANTUS) 100 UNIT/ML Solostar Pen Inject 25-80 Units into the skin See admin instructions. Inject 45 units in the morning and 25 units at night. IF sugars >150 FOR ONE WEEK, increase insulin DOSE by FIVE units. Max of 80 units.   Yes [provider]  ipratropium-albuterol (DUONEB) 0.5-2.5 (3) MG/3ML SOLN Take 3 mLs by nebulization every 6 (six) hours as needed. 09/24/20  Yes Regalado, Belkys A, MD  isosorbide mononitrate (IMDUR) 30 MG 24 hr tablet Take 30 mg by mouth daily. 07/04/20  Yes [provider]  lactulose (CHRONULAC) 10 GM/15ML solution Take 15-30 mLs (10-20 g total) by mouth daily as needed for moderate constipation. 04/15/21  Yes [name], MD  LORazepam (ATIVAN) 0.5 MG tablet Take 0.25-0.5 mg by mouth every 4 (four) hours as needed for anxiety.  03/26/21  Yes [provider]  lubiprostone ([name]) 8 MCG capsule Take 1 capsule (8 mcg total) by mouth 2 (two) times daily with a meal. 01/28/21  Yes [name], [name], MD  metFORMIN (GLUCOPHAGE) 500 MG tablet TAKE ONE TABLET BY MOUTH EVERY MORNING and TAKE ONE TABLET BY MOUTH EVERY EVENING Patient taking differently: Take 500 mg by mouth in the morning and at bedtime. 03/18/21  Yes [name], MD  nitroGLYCERIN (NITROSTAT) 0.4 MG SL tablet DISSOLVE 1 TABLET UNDER THE TONGUE EVERY 5 MINUTES AS&nbsp;&nbsp;NEEDED FOR CHEST PAIN. MAX&nbsp;&nbsp;OF 3 TABLETS IN 15 MINUTES. CALL 911 IF PAIN PERSISTS. Patient taking differently: Place 0.4 mg under the tongue every 5 (five) minutes as needed for chest pain. 07/25/20  Yes [name], MD  nystatin (MYCOSTATIN) 100000 UNIT/ML  suspension Take 500,000 mLs by mouth 4 (four) times daily. 04/25/21  Yes [provider]  pantoprazole (PROTONIX) 40 MG tablet TAKE ONE TABLET BY MOUTH EVERY MORNING Patient taking differently: Take 40 mg by mouth daily. 03/18/21  Yes Esterwood, Amy S, PA-C  pseudoephedrine (SUDAFED) 60 MG tablet Take 60 mg by mouth every 4 (four) hours as needed for congestion.   Yes [provider]  roflumilast (DALIRESP) 500 MCG TABS tablet Take 1 tablet (500 mcg total) by mouth daily. 02/08/21  Yes [name], MD  rosuvastatin (CRESTOR) 20 MG tablet Take 1 tablet (20 mg total) by mouth daily. 02/25/21  Yes [name], MD  sacubitril-valsartan (ENTRESTO) 24-26 MG Take 1 tablet by mouth 2 (two) times daily. 02/19/21  Yes Nita Sells, MD  SENNA-TIME 8.6 MG tablet Take 1 tablet by mouth daily as needed for constipation. 03/18/21  Yes [provider]  spironolactone (ALDACTONE) 25 MG tablet Take 1 tablet (25 mg total) by mouth daily. 02/25/21  Yes [name], MD  temazepam (RESTORIL) 15 MG capsule Take 15 mg by mouth at bedtime. 04/11/21  Yes [provider]  torsemide (DEMADEX)  20 MG tablet Take 20 mg by mouth 2 (two) times daily. 04/25/21  Yes [provider]  albuterol (PROVENTIL) (2.5 MG/3ML) 0.083% nebulizer solution Take 3 mLs (2.5 mg total) by nebulization every 6 (six) hours as needed for shortness of breath. Patient not taking: No sig reported 12/25/20   Aline August, MD  Blood Glucose Monitoring Suppl (ONE TOUCH ULTRA 2) w/Device KIT Use to test blood sugars 1-2 times daily. 04/02/20   [name], MD  ELIQUIS 5 MG TABS tablet TAKE ONE TABLET BY MOUTH EVERY MORNING and TAKE ONE TABLET BY MOUTH EVERY EVENING Patient taking differently: Take 5 mg by mouth 2 (two) times daily. 10/22/20   Buford Dresser, MD  Lancets [name] ULTRASOFT) lancets USE TO TEST BLOOD SUGAR 1-2 TIMES DAILY 05/22/20   [name], MD  oxyCODONE (OXY IR/ROXICODONE) 5 MG immediate release tablet Take 5-10 mg by mouth every 2 (two) hours as needed for severe pain or moderate pain. 04/01/21   [provider]  TRUEPLUS 5-BEVEL PEN NEEDLES 31G X 6 MM MISC USE AS DIRECTED EVERYDAY AT BEDTIME 04/15/21   [name], MD  omeprazole (PRILOSEC) 20 MG capsule Take 20 mg by mouth daily. 03/26/21 04/06/21  [provider]    Allergies    Lisinopril, Other, and Tomato  Review of Systems   Review of Systems  Constitutional:  Negative for appetite change, chills and fever.  HENT:  Negative for ear pain, rhinorrhea, sneezing and sore throat.   Eyes:  Negative for photophobia and visual disturbance.  Respiratory:  Positive for shortness of breath and wheezing. Negative for cough and chest tightness.   Cardiovascular:  Negative for chest pain and palpitations.  Gastrointestinal:  Negative for abdominal pain, blood in stool, constipation, diarrhea, nausea and vomiting.  Genitourinary:  Negative for dysuria, hematuria and urgency.  Musculoskeletal:  Negative for myalgias.  Skin:  Negative for rash.  Neurological:  Negative for dizziness, weakness and  light-headedness.   Physical Exam Updated Vital Signs BP (!) 155/85   Pulse 89   Temp 98.2 F (36.8 C)   Resp 16   Ht 6' (1.829 m)   Wt 89.8 kg   SpO2 100%   BMI 26.85 kg/m   Physical Exam Vitals and nursing note reviewed.  Constitutional:  General: He is not in acute distress.    Appearance: He is well-developed.  HENT:     Head: Normocephalic and atraumatic.     Nose: Nose normal.  Eyes:     General: No scleral icterus.       Left eye: No discharge.     Conjunctiva/sclera: Conjunctivae normal.  Cardiovascular:     Rate and Rhythm: Normal rate and regular rhythm.     Heart sounds: Normal heart sounds. No murmur heard.   No friction rub. No gallop.  Pulmonary:     Effort: Pulmonary effort is normal. Tachypnea present. No respiratory distress.     Breath sounds: Examination of the right-middle field reveals wheezing. Examination of the left-middle field reveals wheezing. Examination of the right-lower field reveals wheezing. Examination of the left-lower field reveals wheezing. Wheezing present.  Abdominal:     General: Bowel sounds are normal. There is no distension.     Palpations: Abdomen is soft.     Tenderness: There is no abdominal tenderness. There is no guarding.  Musculoskeletal:        General: Normal range of motion.     Cervical back: Normal range of motion and neck supple.     Right lower leg: No edema.     Left lower leg: No edema.  Skin:    General: Skin is warm and dry.     Findings: No rash.  Neurological:     Mental Status: He is alert.     Motor: No abnormal muscle tone.     Coordination: Coordination normal.    ED Results / Procedures / Treatments   Labs (all labs ordered are listed, but only abnormal results are displayed) Labs Reviewed  COMPREHENSIVE METABOLIC PANEL - Abnormal; Notable for the following components:      Result Value   Sodium 134 (*)    Chloride 83 (*)    CO2 42 (*)    Glucose, Bld 199 (*)    BUN 30 (*)     Creatinine, Ser 1.98 (*)    Total Protein 6.0 (*)    Albumin 3.3 (*)    GFR, Estimated 38 (*)    All other components within normal limits  CBC WITH DIFFERENTIAL/PLATELET - Abnormal; Notable for the following components:   WBC 11.8 (*)    RBC 3.20 (*)    Hemoglobin 8.7 (*)    HCT 28.6 (*)    Neutro Abs 10.0 (*)    Abs Immature Granulocytes 0.14 (*)    All other components within normal limits  BRAIN NATRIURETIC PEPTIDE - Abnormal; Notable for the following components:   B Natriuretic Peptide 205.2 (*)    All other components within normal limits  I-STAT VENOUS BLOOD GAS, ED - Abnormal; Notable for the following components:   pCO2, Ven 80.1 (*)    pO2, Ven 166.0 (*)    Bicarbonate 49.5 (*)    TCO2 >50 (*)    Acid-Base Excess 21.0 (*)    Sodium 131 (*)    HCT 29.0 (*)    Hemoglobin 9.9 (*)    All other components within normal limits  RESP PANEL BY RT-PCR (FLU A&B, COVID) [id number]    EKG EKG Interpretation  Date/Time:  Friday May 03 2021 08:01:24 EDT Ventricular Rate:  95 PR Interval:  195 QRS Duration: 92 QT Interval:  360 QTC Calculation: 453 R Axis:   57 Text Interpretation: Sinus rhythm Probable LVH with secondary repol abnrm No significant change since last tracing  Confirmed by Calvert Cantor [id number]) on 05/03/2021 8:15:30 AM  Radiology DG Chest Port 1 View  Result Date: 05/03/2021 CLINICAL DATA:  Shortness of breath starting at 7 a.m. EXAM: PORTABLE CHEST 1 VIEW COMPARISON:  04/06/2021 FINDINGS: Atherosclerotic calcification of the aortic arch. Emphysema. Anterior rib deformities related to multiple bilateral healed rib fractures. The lungs appear otherwise clear. Heart size within normal limits. No blunting of the costophrenic angles. IMPRESSION: 1. No acute findings. 2. Aortic Atherosclerosis (ICD10-I70.0) and Emphysema (ICD10-J43.9). 3. Multiple bilateral anterior rib deformities related to healed fractures. Electronically Signed   By: Van Clines M.D.   On:  05/03/2021 08:41    Procedures Procedures   Medications Ordered in ED Medications  amLODipine (NORVASC) tablet 5 mg (has no administration in time range)  ipratropium-albuterol (DUONEB) 0.5-2.5 (3) MG/3ML nebulizer solution 3 mL (has no administration in time range)  albuterol (PROVENTIL) (2.5 MG/3ML) 0.083% nebulizer solution (  Not Given 05/03/21 1056)  ipratropium-albuterol (DUONEB) 0.5-2.5 (3) MG/3ML nebulizer solution 3 mL (3 mLs Nebulization Given 05/03/21 0829)  methylPREDNISolone sodium succinate (SOLU-MEDROL) 125 mg/2 mL injection 125 mg (125 mg Intravenous Given 05/03/21 0829)  magnesium sulfate IVPB 2 g 50 mL (0 g Intravenous Stopped 05/03/21 1049)  albuterol (PROVENTIL) (2.5 MG/3ML) 0.083% nebulizer solution 5 mg (5 mg Nebulization Given 05/03/21 1056)    ED Course  I have reviewed the triage vital signs and the nursing notes.  Pertinent labs & imaging results that were available during my care of the patient were reviewed by me and considered in my medical decision making (see chart for details).  Clinical Course as of 05/03/21 1148  Fri May 03, 2021  0845 DG Chest [place] 1 View No acute findings. [HK]  0911 B Natriuretic Peptide(!): 205.2 [HK]  0916 On recheck patient states somewhat improved but continues to be tachypnec and increased WOB. States he feels tired.  Family member at bedside states he is under hospice care, was prescribed oxycodone to "help with his breathing, he took 2 this morning so that might have something to do with him feeling sleepy." [HK]  1040 SARS Coronavirus 2 by RT PCR: NEGATIVE [HK]  1040 WBC(!): 11.8 [HK]  1040 Hemoglobin(!): 8.7 Around baseline. [HK]  1040 Creatinine(!): 1.98 [HK]    Clinical Course User Index [HK] Delia Heady, PA-C   MDM Rules/Calculators/A&P                           60 year old male with past medical history of CHF, COPD presenting to the ED with a chief complaint of shortness of breath.  Minimal improvement noted with  home breathing treatments.  States that his CPAP and home supplemental oxygen did give him some relief.  Patient denies any chest pain or significant leg swelling.  On exam patient with wheezing throughout bilateral lung fields.  He is tachypneic.  He was weaned down to 1 L of oxygen by nasal cannula.  Will give breathing treatment, Solu-Medrol, magnesium and reassess.   Work-up significant for elevated PCO2 on VBG, BMP unremarkable.  CBC with hemoglobin at baseline.  Creatinine appears around baseline on CMP.  Chest x-ray is unremarkable.  EKG shows sinus rhythm, no changes from prior tracings.  Patient with improvement with medications given here.  He is hypertensive here but I feel that this is most likely related to not taking his home medications this morning yet.  Asked patient and states that he feels back to his baseline.  His tachypnea has improved.  He remains on his usual 3 L of oxygen via nasal cannula.  I suspect this could be related to COPD exacerbation.  He is on dexamethasone at home so we will have him double this for the next 5 days this acute episode.  No change in sputum production and no findings on x-ray that would warrant antibiotic therapy at this time.  Will have him follow-up with his primary care provider.  Patient is agreeable to the plan.  Return precautions given. Patient discussed with and seen by the attending, Dr. Karle Starch.    Patient is hemodynamically stable, in NAD, and able to ambulate in the ED. Evaluation does not show pathology that would require ongoing emergent intervention or inpatient treatment. I explained the diagnosis to the patient. Pain has been managed and has no complaints prior to discharge. Patient is comfortable with above plan and is stable for discharge at this time. All questions were answered prior to disposition. Strict return precautions for returning to the ED were discussed. Encouraged follow up with PCP.   An After Visit Summary was printed and  given to the patient.   Portions of this note were generated with [employer]obby[employer]. Dictation errors may occur despite best attempts at proofreading.  Final Clinical Impression(s) / ED Diagnoses Final diagnoses:  COPD exacerbation ([place])    Rx / DC Orders ED Discharge Orders          Ordered    predniSONE (DELTASONE) 10 MG tablet  Daily,   Status:  Discontinued        05/03/21 [place], Dejae Bernet, PA-C 05/03/21 1150    Truddie Hidden, MD 05/03/21 1520

## 2021-05-03 NOTE — ED Notes (Signed)
Pt verbalized understanding of d/c instructions, meds and followup care. Denies questions. Pt states he will take his home BP meds when he gets home, declines ordered amlodipine here in ED. No distress noted. Steady gait to exit with home oxygen brought by significant other.

## 2021-05-03 NOTE — ED Triage Notes (Signed)
BIB EMS for SOB starting 0700; pt w hx CHF, COPD, has had CPR/rescue breathing in the past per medic. Did not get relief from home duoneb, did experience relief with home CPAP and O2. Pt lung sounds are wheezy and diminished but no acute distress at this time, saturations 91% on  RA, 4LPM per [place] applied and sat 97% at this time.

## 2021-05-03 NOTE — ED Notes (Signed)
Critical result given to Dr. Bernette Mayers

## 2021-05-03 NOTE — ED Notes (Signed)
Neb tx, steroid given, mag running, pt remains wheezy/working to breathe. Updated EDP.

## 2021-05-03 NOTE — Discharge Instructions (Addendum)
Double your decadron to 8mg  for 5 days starting tomorrow, then go back to 4mg . Do not take the prednisone that I sent to the pharmacy, as this was entered in error. Continue your other medications and inhalers as previously prescribed. Return to the ER for worsening shortness of breath, if you develop chest pain, leg swelling or fever.

## 2021-05-08 ENCOUNTER — Telehealth: Payer: Medicare Other

## 2021-05-08 ENCOUNTER — Telehealth: Payer: Self-pay

## 2021-05-08 NOTE — Telephone Encounter (Cosign Needed)
  Care Management   Follow Up Note   05/08/2021 Name: Kenneth [name] MRN: [id number] DOB: [birth year]   Referred by: [name], MD Reason for referral : Chronic Care Management (HF, COPD, DMII)   Third unsuccessful telephone outreach was attempted today. The patient was referred to the case management team for assistance with care management and care coordination. The patient's primary care provider has been notified of our unsuccessful attempts to make or maintain contact with the patient. The care management team is pleased to engage with this patient at any time in the future should he/she be interested in assistance from the care management team.  Telephone call to patient x 2.  Unable to reach patient or leave voice message due to phone only ringing.   Follow Up Plan: RNCM will notify provider of unsuccessful call attempts.   George Ina RN,BSN,CCM RN Case Manager [place] [phone]

## 2021-05-09 ENCOUNTER — Other Ambulatory Visit: Payer: Self-pay | Admitting: Emergency Medicine

## 2021-05-09 ENCOUNTER — Ambulatory Visit: Payer: Medicare Other | Admitting: Gastroenterology

## 2021-05-13 ENCOUNTER — Telehealth: Admitting: Family Medicine

## 2021-05-13 NOTE — Telephone Encounter (Signed)
Pt called to see if appt 8/24 can be done virtually. Pt has several health issues and recent ER visit. Please notify pt [phone].

## 2021-05-14 DIAGNOSIS — R092 Respiratory arrest: Secondary | ICD-10-CM | POA: Diagnosis not present

## 2021-05-14 DIAGNOSIS — R404 Transient alteration of awareness: Secondary | ICD-10-CM | POA: Diagnosis not present

## 2021-05-14 DIAGNOSIS — Z743 Need for continuous supervision: Secondary | ICD-10-CM | POA: Diagnosis not present

## 2021-05-14 DIAGNOSIS — I469 Cardiac arrest, cause unspecified: Secondary | ICD-10-CM | POA: Diagnosis not present

## 2021-05-14 DIAGNOSIS — I499 Cardiac arrhythmia, unspecified: Secondary | ICD-10-CM | POA: Diagnosis not present

## 2021-05-15 ENCOUNTER — Ambulatory Visit: Admitting: Family Medicine

## 2021-05-20 DIAGNOSIS — J449 Chronic obstructive pulmonary disease, unspecified: Secondary | ICD-10-CM | POA: Diagnosis not present

## 2021-05-21 ENCOUNTER — Ambulatory Visit: Payer: Medicare Other | Admitting: Adult Health

## 2021-05-23 DEATH — deceased

## 2021-06-05 ENCOUNTER — Telehealth: Payer: Medicare Other

## 2021-07-22 ENCOUNTER — Ambulatory Visit: Payer: Medicare Other | Admitting: Podiatry

## 2021-08-30 ENCOUNTER — Ambulatory Visit: Payer: Self-pay

## 2021-08-30 NOTE — Chronic Care Management (AMB) (Signed)
  Care Management   Follow Up Note   08/30/2021 Name: Kenneth [name] MRN: [id number] DOB: September 26, 1960   Referred by: [name], MD Reason for referral : No chief complaint on file.   Patient deceased.   Follow Up Plan:  No further follow up  George Ina RN,BSN,CCM RN Case Manager [place]  [phone]

## 2022-01-01 IMAGING — DX DG CHEST 2V
2 series · 2 of 2 positions shown · non-contrast
Comparison: 04/13/2020

CLINICAL DATA: COPD

EXAM:
CHEST - 2 VIEW

[chest pa]
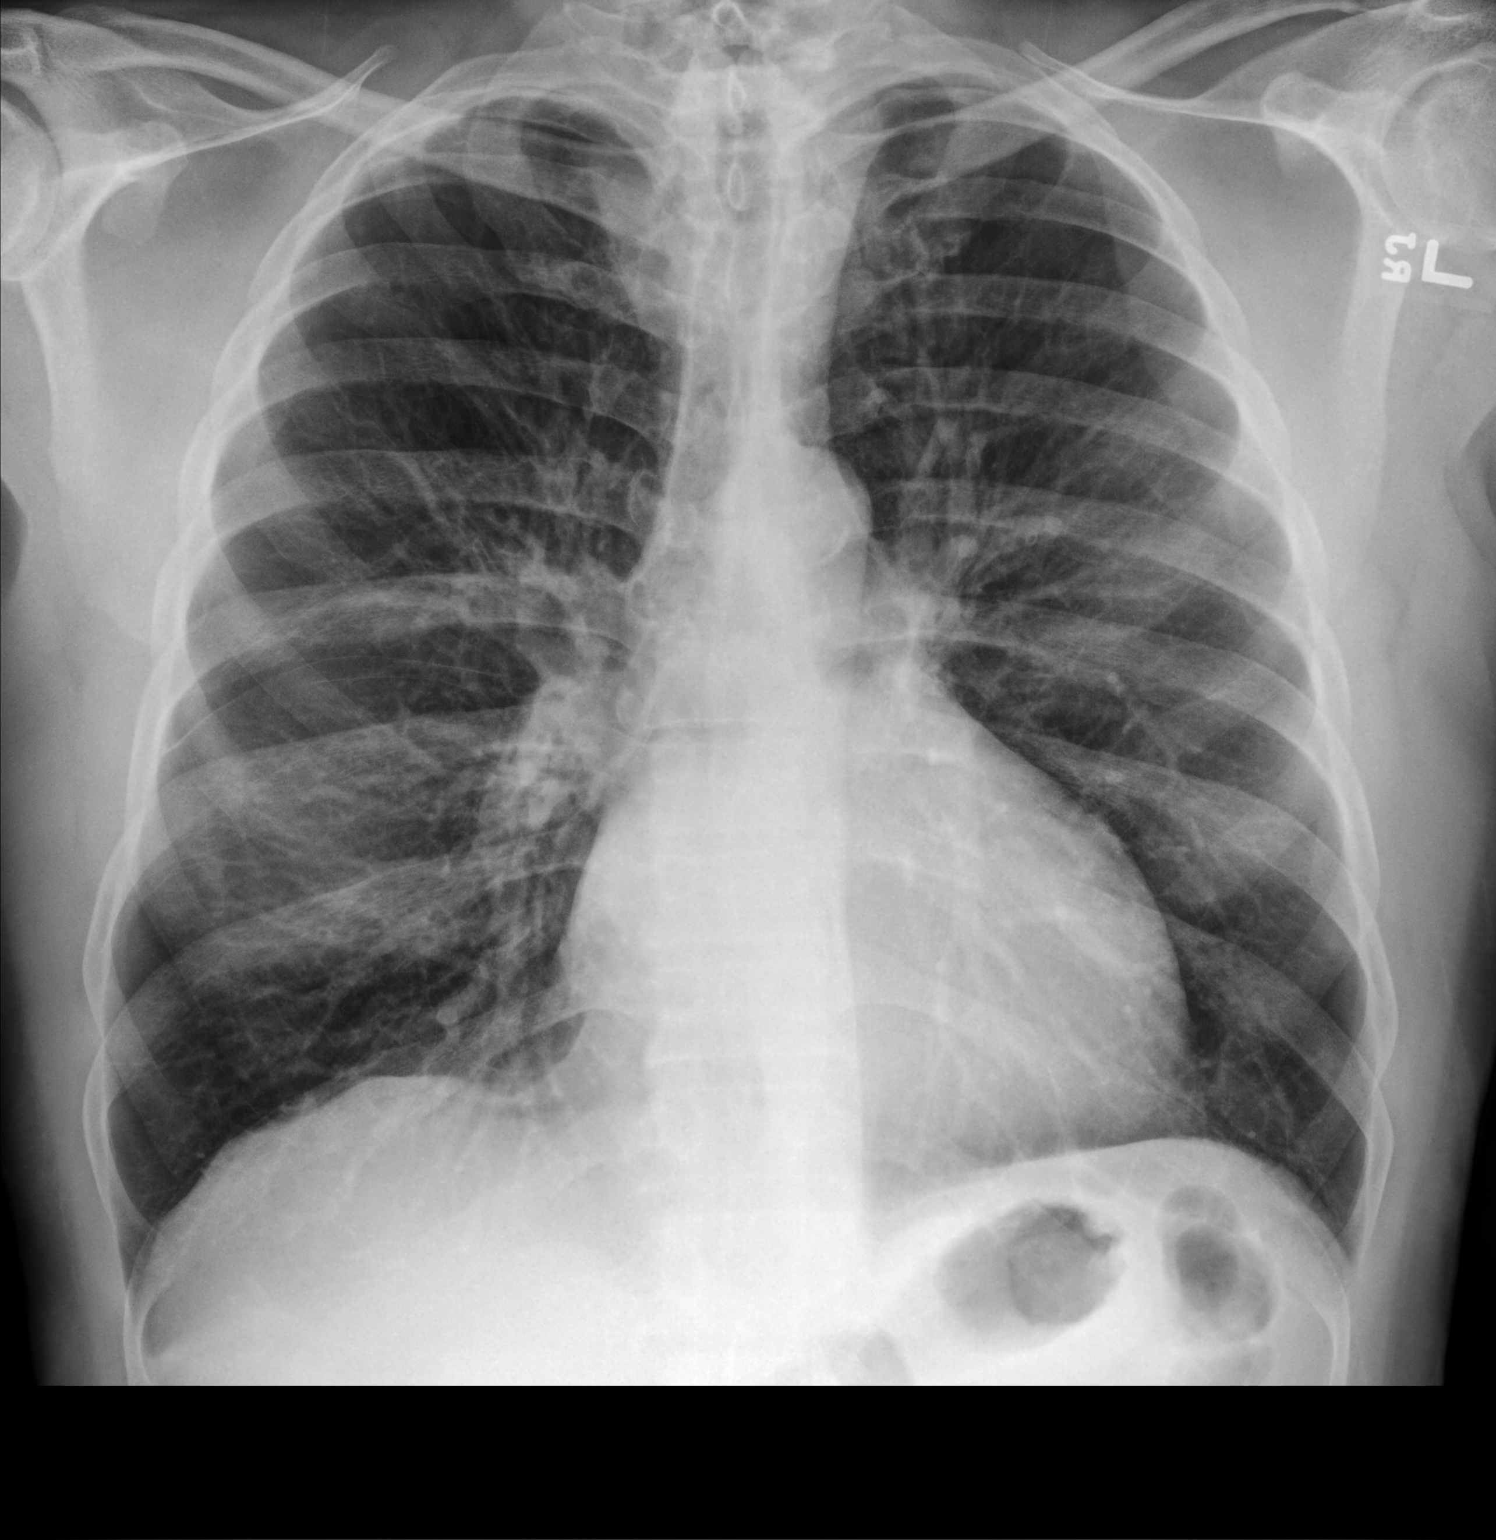

[chest lat]
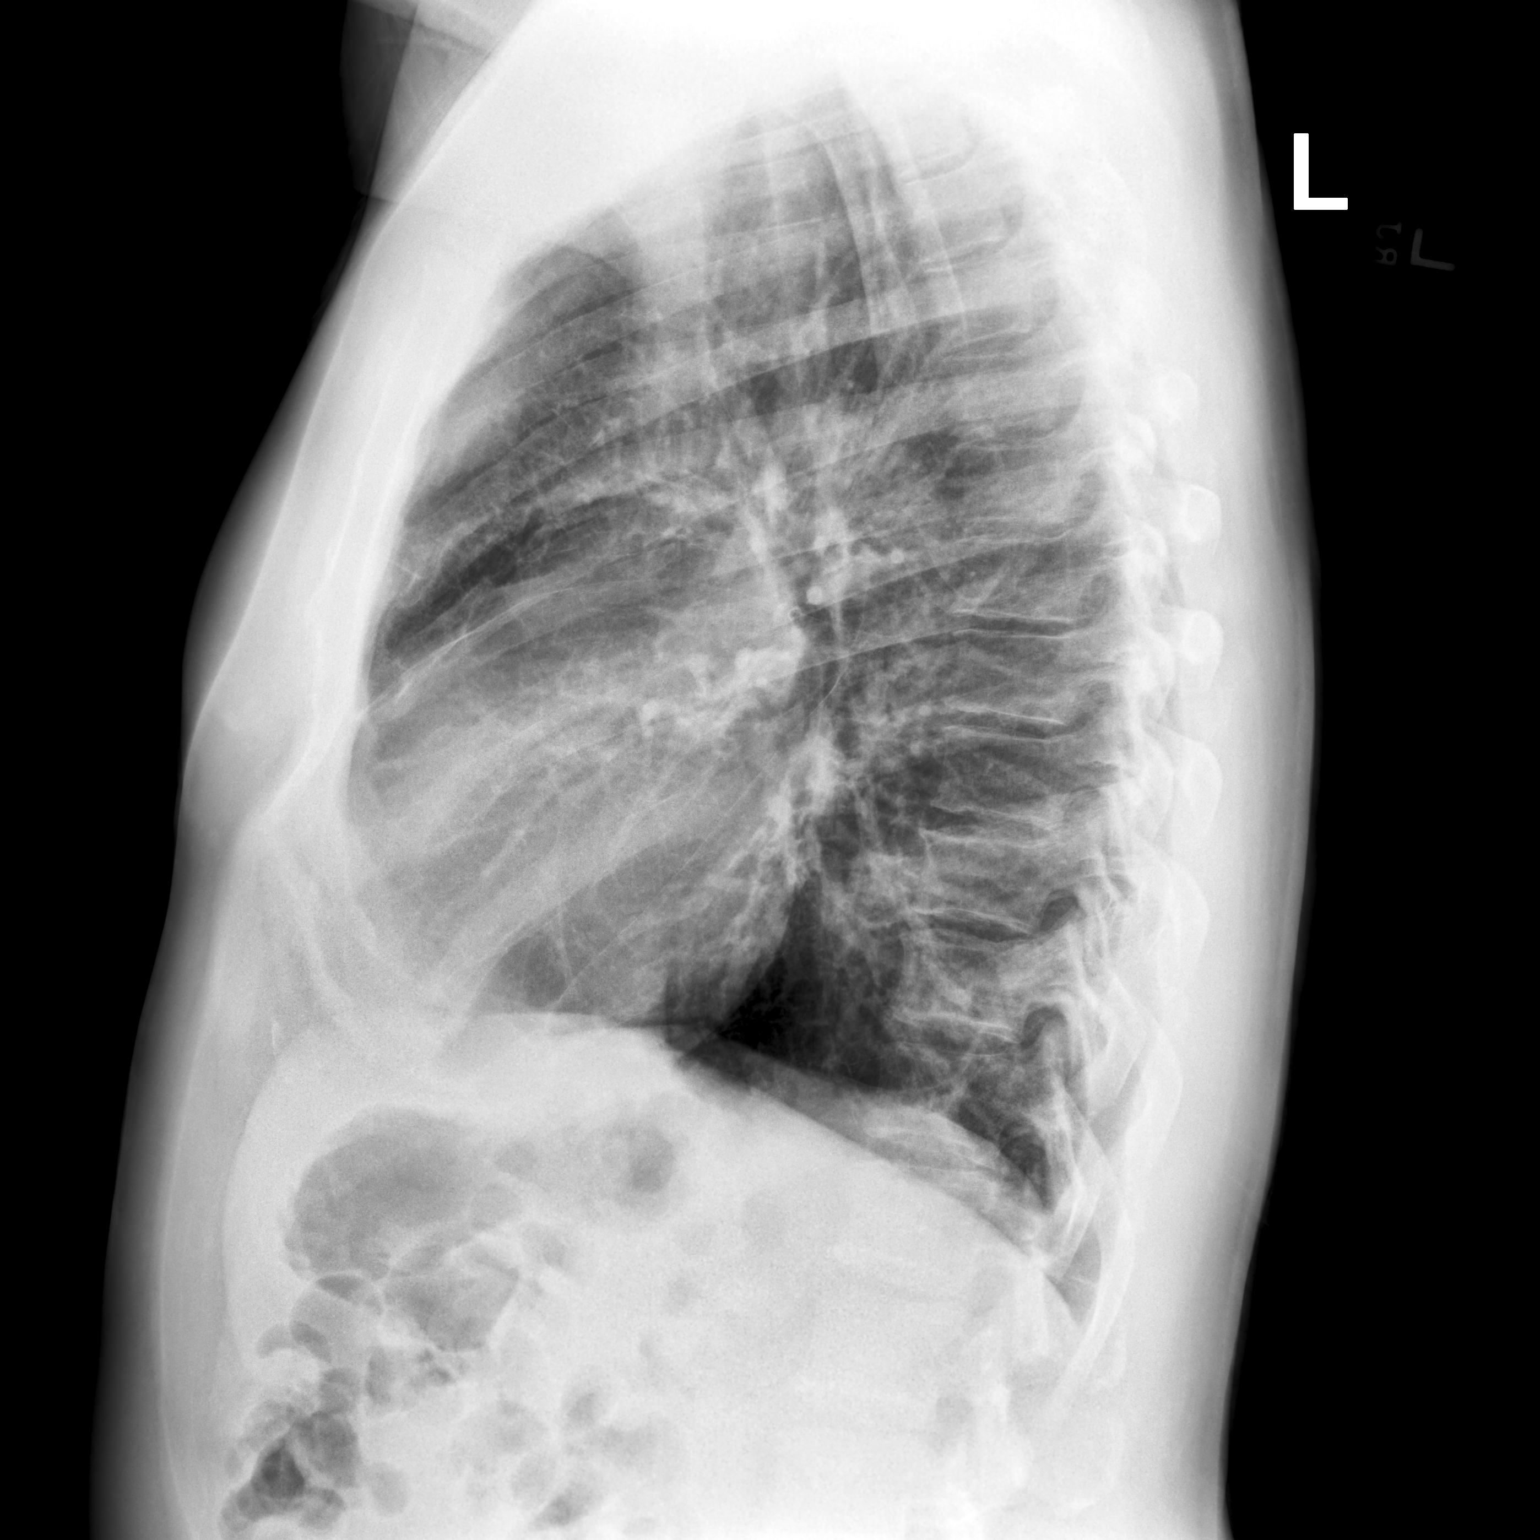

[2 of 2 positions shown; findings below may reference images not displayed]

FINDINGS: Lungs are hyperinflated as can be seen with COPD. No focal
consolidation. No pleural effusion or pneumothorax. Heart and
mediastinal contours are unremarkable.

No acute osseous abnormality.
IMPRESSION: No active cardiopulmonary disease.

## 2022-01-17 IMAGING — DX DG CHEST 1V PORT
1 series · 1 of 1 positions shown · non-contrast
Comparison: June 01, 2020

CLINICAL DATA: Shortness of breath.

EXAM:
PORTABLE CHEST 1 VIEW

[chest]
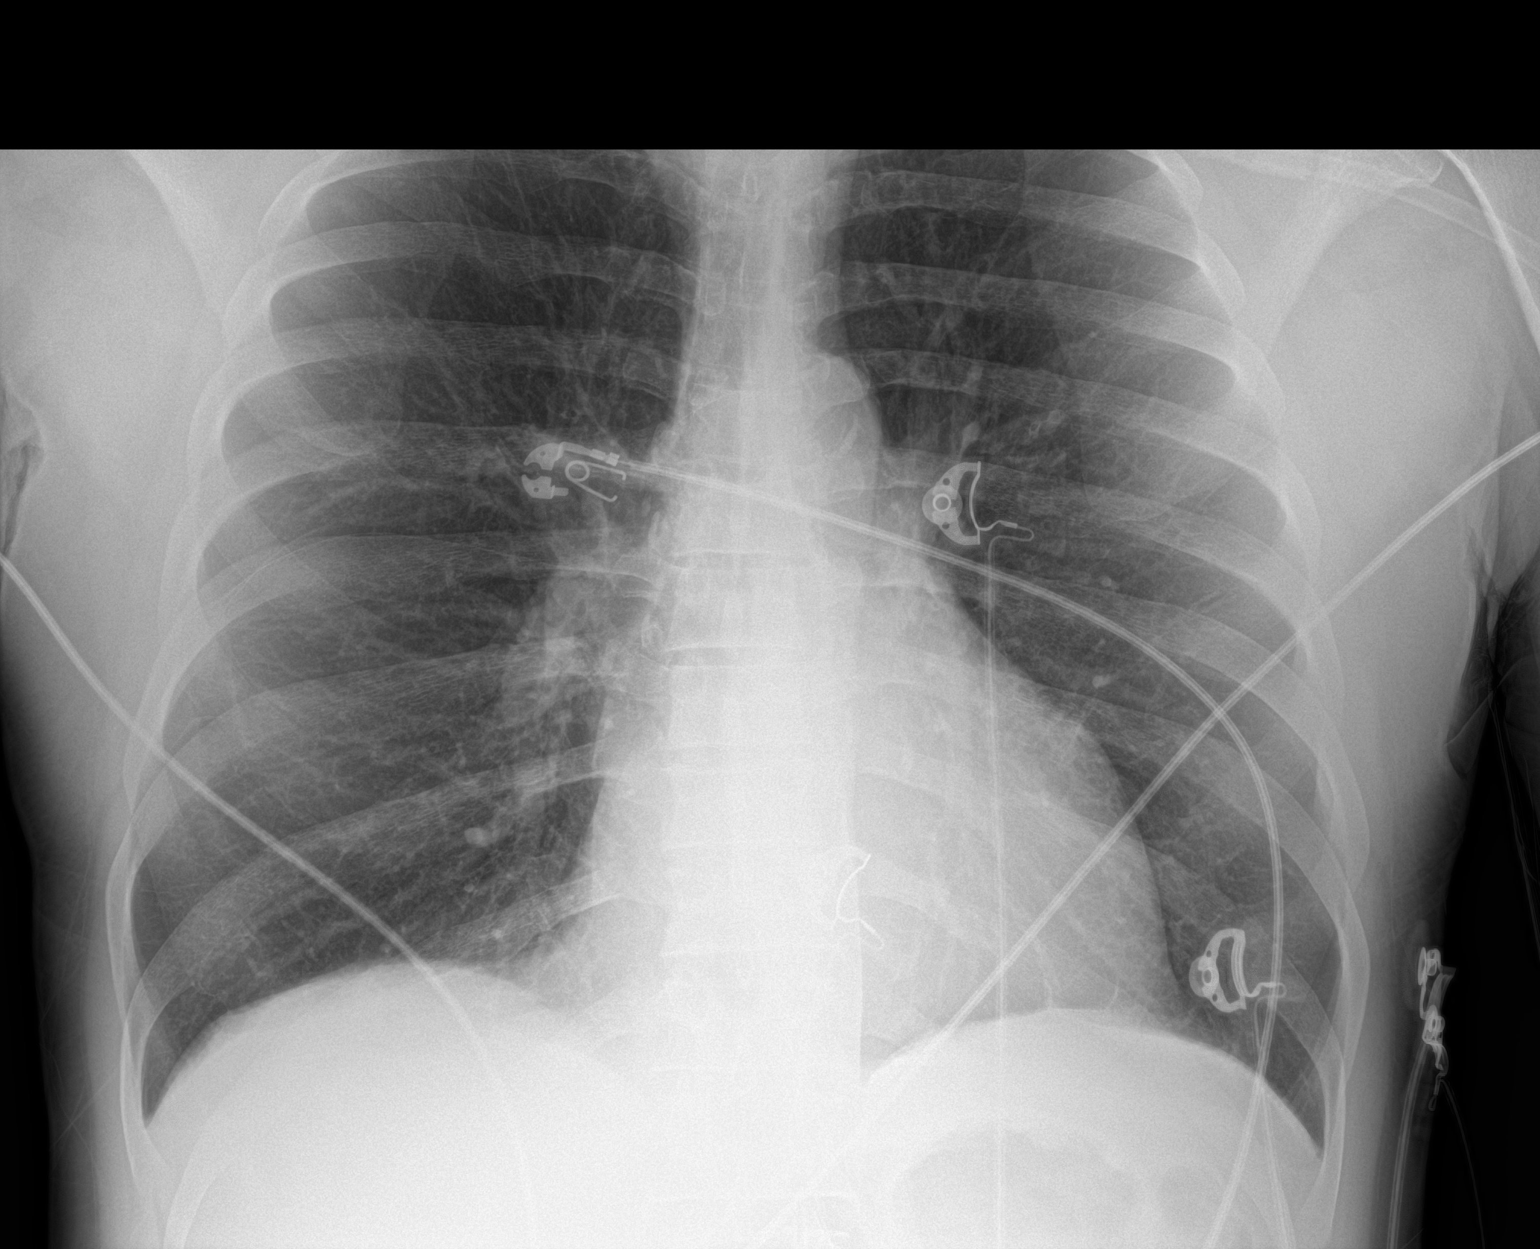

[1 of 1 positions shown; findings below may reference images not displayed]

FINDINGS: Cardiomediastinal silhouette is normal. Mediastinal contours appear
intact.

There is no evidence of focal airspace consolidation, pleural
effusion or pneumothorax.

Osseous structures are without acute abnormality. Soft tissues are
grossly normal.
IMPRESSION: No active disease.

## 2022-04-14 IMAGING — DX DG ABD PORTABLE 1V
1 series · 1 of 1 positions shown · non-contrast
Comparison: None.

CLINICAL DATA: Enteric catheter placement

EXAM:
PORTABLE ABDOMEN - 1 VIEW

[abdomen]
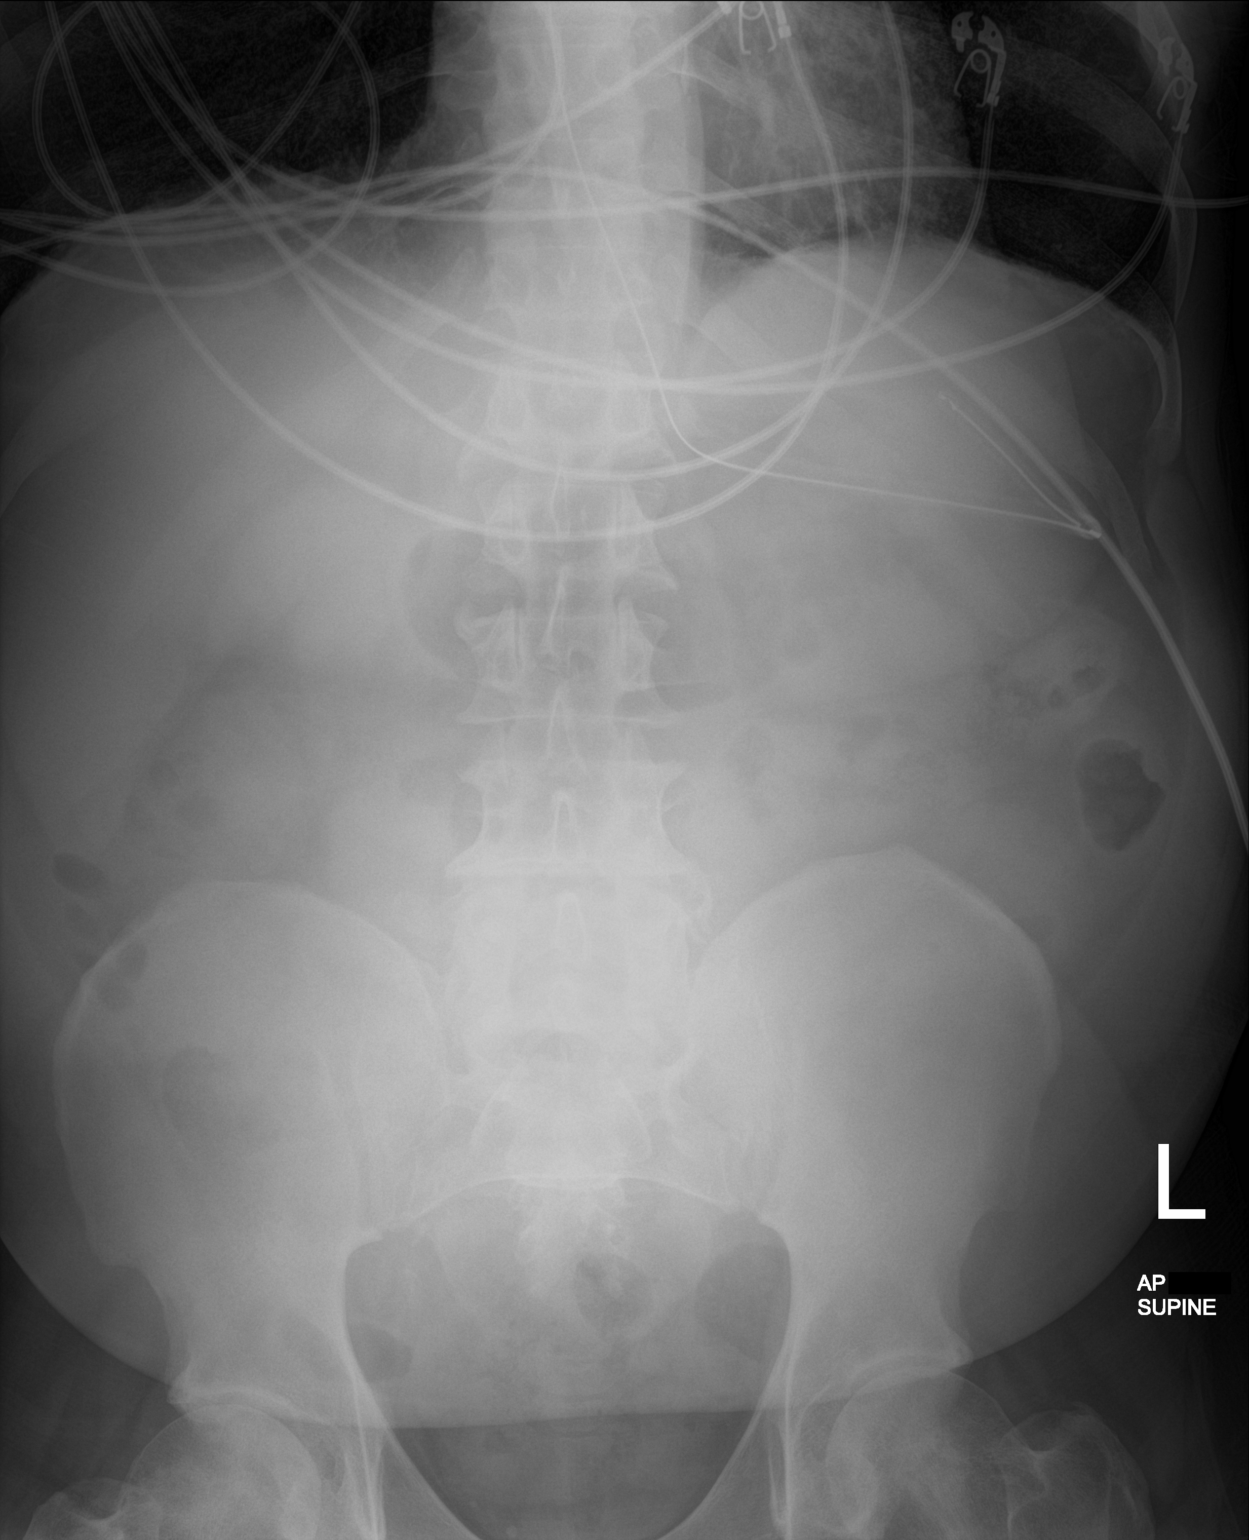

[1 of 1 positions shown; findings below may reference images not displayed]

FINDINGS: Supine frontal view of the abdomen and pelvis excludes the right
flank and pubic symphysis by collimation. Enteric catheter coiled
over the gastric fundus. Bowel gas pattern is unremarkable.
IMPRESSION: 1. Enteric catheter overlying gastric fundus.

## 2022-05-07 NOTE — Telephone Encounter (Signed)
Closing encounter
